# Patient Record
Sex: Male | Born: 1965 | Race: Black or African American | Hispanic: No | Marital: Married | Smoking: Never smoker
Health system: Southern US, Community
[De-identification: ages and names within clinical notes are randomized; demographics above are authoritative.]

## PROBLEM LIST (undated history)

## (undated) DIAGNOSIS — I10 Essential (primary) hypertension: Secondary | ICD-10-CM

## (undated) DIAGNOSIS — I161 Hypertensive emergency: Secondary | ICD-10-CM

## (undated) DIAGNOSIS — J45909 Unspecified asthma, uncomplicated: Secondary | ICD-10-CM

## (undated) DIAGNOSIS — J869 Pyothorax without fistula: Secondary | ICD-10-CM

## (undated) DIAGNOSIS — N179 Acute kidney failure, unspecified: Secondary | ICD-10-CM

## (undated) DIAGNOSIS — F4323 Adjustment disorder with mixed anxiety and depressed mood: Secondary | ICD-10-CM

## (undated) DIAGNOSIS — Z992 Dependence on renal dialysis: Secondary | ICD-10-CM

## (undated) DIAGNOSIS — N186 End stage renal disease: Secondary | ICD-10-CM

## (undated) DIAGNOSIS — N289 Disorder of kidney and ureter, unspecified: Secondary | ICD-10-CM

## (undated) DIAGNOSIS — E1129 Type 2 diabetes mellitus with other diabetic kidney complication: Secondary | ICD-10-CM

## (undated) DIAGNOSIS — J90 Pleural effusion, not elsewhere classified: Secondary | ICD-10-CM

## (undated) DIAGNOSIS — E119 Type 2 diabetes mellitus without complications: Secondary | ICD-10-CM

## (undated) DIAGNOSIS — K219 Gastro-esophageal reflux disease without esophagitis: Secondary | ICD-10-CM

## (undated) DIAGNOSIS — I509 Heart failure, unspecified: Secondary | ICD-10-CM

## (undated) DIAGNOSIS — F410 Panic disorder [episodic paroxysmal anxiety] without agoraphobia: Secondary | ICD-10-CM

## (undated) DIAGNOSIS — I674 Hypertensive encephalopathy: Secondary | ICD-10-CM

## (undated) DIAGNOSIS — J189 Pneumonia, unspecified organism: Secondary | ICD-10-CM

## (undated) DIAGNOSIS — Z9989 Dependence on other enabling machines and devices: Secondary | ICD-10-CM

## (undated) DIAGNOSIS — G4730 Sleep apnea, unspecified: Secondary | ICD-10-CM

## (undated) DIAGNOSIS — E1149 Type 2 diabetes mellitus with other diabetic neurological complication: Secondary | ICD-10-CM

## (undated) HISTORY — DX: Type 2 diabetes mellitus without complications: E11.9

## (undated) HISTORY — DX: Unspecified asthma, uncomplicated: J45.909

## (undated) HISTORY — PX: NO PAST SURGERIES: SHX2092

---

## 2010-09-03 ENCOUNTER — Emergency Department (HOSPITAL_COMMUNITY)
Admission: EM | Admit: 2010-09-03 | Discharge: 2010-09-03 | Disposition: A | Discharge status: Home / Self Care | Payer: BC Managed Care – PPO | Attending: Emergency Medicine | Admitting: Emergency Medicine

## 2010-09-03 ENCOUNTER — Emergency Department (HOSPITAL_COMMUNITY): Payer: BC Managed Care – PPO

## 2010-09-03 DIAGNOSIS — N183 Chronic kidney disease, stage 3 unspecified: Secondary | ICD-10-CM | POA: Insufficient documentation

## 2010-09-03 DIAGNOSIS — I509 Heart failure, unspecified: Secondary | ICD-10-CM | POA: Insufficient documentation

## 2010-09-03 DIAGNOSIS — R42 Dizziness and giddiness: Secondary | ICD-10-CM | POA: Insufficient documentation

## 2010-09-03 DIAGNOSIS — R51 Headache: Secondary | ICD-10-CM | POA: Insufficient documentation

## 2010-09-03 DIAGNOSIS — R0602 Shortness of breath: Secondary | ICD-10-CM | POA: Insufficient documentation

## 2010-09-03 DIAGNOSIS — I129 Hypertensive chronic kidney disease with stage 1 through stage 4 chronic kidney disease, or unspecified chronic kidney disease: Secondary | ICD-10-CM | POA: Insufficient documentation

## 2010-09-03 DIAGNOSIS — R110 Nausea: Secondary | ICD-10-CM | POA: Insufficient documentation

## 2010-09-03 DIAGNOSIS — R55 Syncope and collapse: Secondary | ICD-10-CM | POA: Insufficient documentation

## 2010-09-03 LAB — CK TOTAL AND CKMB (NOT AT ARMC): Total CK: 296 U/L — ABNORMAL HIGH (ref 7–232)

## 2010-09-03 LAB — CBC
MCH: 29.9 pg (ref 26.0–34.0)
MCHC: 34.5 g/dL (ref 30.0–36.0)
Platelets: 279 10*3/uL (ref 150–400)
RBC: 4.45 MIL/uL (ref 4.22–5.81)
RDW: 13.4 % (ref 11.5–15.5)

## 2010-09-03 LAB — BASIC METABOLIC PANEL
BUN: 20 mg/dL (ref 6–23)
CO2: 28 mEq/L (ref 19–32)
Chloride: 103 mEq/L (ref 96–112)
Creatinine, Ser: 1.81 mg/dL — ABNORMAL HIGH (ref 0.4–1.5)
Glucose, Bld: 98 mg/dL (ref 70–99)
Potassium: 2.9 mEq/L — ABNORMAL LOW (ref 3.5–5.1)

## 2010-09-03 LAB — DIFFERENTIAL
Basophils Relative: 0 % (ref 0–1)
Eosinophils Absolute: 0.2 10*3/uL (ref 0.0–0.7)
Eosinophils Relative: 2 % (ref 0–5)
Monocytes Relative: 5 % (ref 3–12)
Neutrophils Relative %: 67 % (ref 43–77)

## 2010-09-03 LAB — TROPONIN I: Troponin I: <0.3 ng/mL (ref <0.30)

## 2011-01-08 ENCOUNTER — Emergency Department (HOSPITAL_COMMUNITY): Payer: BC Managed Care – PPO

## 2011-01-08 ENCOUNTER — Inpatient Hospital Stay (HOSPITAL_COMMUNITY)
Admission: EM | Admit: 2011-01-08 | Discharge: 2011-01-10 | DRG: 331 | Disposition: A | Discharge status: Home / Self Care | Payer: BC Managed Care – PPO | Attending: Internal Medicine | Admitting: Internal Medicine

## 2011-01-08 DIAGNOSIS — Z23 Encounter for immunization: Secondary | ICD-10-CM

## 2011-01-08 DIAGNOSIS — Z79899 Other long term (current) drug therapy: Secondary | ICD-10-CM

## 2011-01-08 DIAGNOSIS — N183 Chronic kidney disease, stage 3 unspecified: Secondary | ICD-10-CM | POA: Diagnosis present

## 2011-01-08 DIAGNOSIS — I509 Heart failure, unspecified: Secondary | ICD-10-CM | POA: Diagnosis present

## 2011-01-08 DIAGNOSIS — E876 Hypokalemia: Secondary | ICD-10-CM | POA: Diagnosis present

## 2011-01-08 DIAGNOSIS — E119 Type 2 diabetes mellitus without complications: Secondary | ICD-10-CM | POA: Diagnosis present

## 2011-01-08 DIAGNOSIS — I129 Hypertensive chronic kidney disease with stage 1 through stage 4 chronic kidney disease, or unspecified chronic kidney disease: Principal | ICD-10-CM | POA: Diagnosis present

## 2011-01-08 LAB — POCT I-STAT TROPONIN I: Troponin i, poc: 0.02 ng/mL (ref 0.00–0.08)

## 2011-01-08 LAB — BASIC METABOLIC PANEL
BUN: 19 mg/dL (ref 6–23)
CO2: 28 mEq/L (ref 19–32)
Calcium: 9.6 mg/dL (ref 8.4–10.5)
GFR calc non Af Amer: 38 mL/min — ABNORMAL LOW (ref >90)
Glucose, Bld: 150 mg/dL — ABNORMAL HIGH (ref 70–99)
Potassium: 2.8 mEq/L — ABNORMAL LOW (ref 3.5–5.1)
Sodium: 136 mEq/L (ref 135–145)

## 2011-01-08 LAB — DIFFERENTIAL
Lymphocytes Relative: 30 % (ref 12–46)
Lymphs Abs: 2.4 10*3/uL (ref 0.7–4.0)
Monocytes Absolute: 0.5 10*3/uL (ref 0.1–1.0)
Monocytes Relative: 6 % (ref 3–12)
Neutro Abs: 5.2 10*3/uL (ref 1.7–7.7)
Neutrophils Relative %: 63 % (ref 43–77)

## 2011-01-08 LAB — CBC
HCT: 37.8 % — ABNORMAL LOW (ref 39.0–52.0)
Hemoglobin: 13.2 g/dL (ref 13.0–17.0)
MCH: 30.3 pg (ref 26.0–34.0)
MCHC: 34.9 g/dL (ref 30.0–36.0)
RBC: 4.36 MIL/uL (ref 4.22–5.81)

## 2011-01-09 LAB — DIFFERENTIAL
Eosinophils Absolute: 0.2 10*3/uL (ref 0.0–0.7)
Lymphocytes Relative: 30 % (ref 12–46)
Lymphs Abs: 2.2 10*3/uL (ref 0.7–4.0)
Monocytes Relative: 5 % (ref 3–12)
Neutrophils Relative %: 62 % (ref 43–77)

## 2011-01-09 LAB — URINALYSIS, ROUTINE W REFLEX MICROSCOPIC
Bilirubin Urine: NEGATIVE
Glucose, UA: NEGATIVE mg/dL
Hgb urine dipstick: NEGATIVE
Ketones, ur: NEGATIVE mg/dL
Leukocytes, UA: NEGATIVE
Protein, ur: NEGATIVE mg/dL
pH: 6.5 (ref 5.0–8.0)

## 2011-01-09 LAB — BASIC METABOLIC PANEL
BUN: 18 mg/dL (ref 6–23)
CO2: 28 mEq/L (ref 19–32)
Calcium: 9.3 mg/dL (ref 8.4–10.5)
Chloride: 103 mEq/L (ref 96–112)
Creatinine, Ser: 1.91 mg/dL — ABNORMAL HIGH (ref 0.50–1.35)
Glucose, Bld: 124 mg/dL — ABNORMAL HIGH (ref 70–99)

## 2011-01-09 LAB — CBC
HCT: 38.7 % — ABNORMAL LOW (ref 39.0–52.0)
MCH: 29.7 pg (ref 26.0–34.0)
MCV: 87 fL (ref 78.0–100.0)
RBC: 4.45 MIL/uL (ref 4.22–5.81)
WBC: 7.3 10*3/uL (ref 4.0–10.5)

## 2011-01-09 LAB — UREA NITROGEN, URINE: Urea Nitrogen, Ur: 525 mg/dL

## 2011-01-09 LAB — MRSA PCR SCREENING: MRSA by PCR: POSITIVE — AB

## 2011-01-09 LAB — OSMOLALITY, URINE: Osmolality, Ur: 440 mOsm/kg (ref 390–1090)

## 2011-01-09 LAB — NA AND K (SODIUM & POTASSIUM), RAND UR: Potassium Urine: 25 mEq/L

## 2011-01-10 LAB — BASIC METABOLIC PANEL
BUN: 17 mg/dL (ref 6–23)
Chloride: 103 mEq/L (ref 96–112)
GFR calc Af Amer: 49 mL/min — ABNORMAL LOW (ref >90)
Glucose, Bld: 117 mg/dL — ABNORMAL HIGH (ref 70–99)
Potassium: 3.3 mEq/L — ABNORMAL LOW (ref 3.5–5.1)

## 2011-01-10 LAB — CBC
Hemoglobin: 13.4 g/dL (ref 13.0–17.0)
MCHC: 34.2 g/dL (ref 30.0–36.0)
RDW: 13.7 % (ref 11.5–15.5)
WBC: 6.8 10*3/uL (ref 4.0–10.5)

## 2011-01-10 LAB — DIFFERENTIAL
Basophils Absolute: 0 10*3/uL (ref 0.0–0.1)
Basophils Relative: 0 % (ref 0–1)
Monocytes Absolute: 0.4 10*3/uL (ref 0.1–1.0)
Neutro Abs: 4.3 10*3/uL (ref 1.7–7.7)
Neutrophils Relative %: 63 % (ref 43–77)

## 2011-01-10 NOTE — H&P (Signed)
NAME:  [name], [name] NO.:  [id number]  MEDICAL RECORD NO.:  [id number]  LOCATION:  2920                         FACILITY:  [facility]  PHYSICIAN:  Ria Bush, MD         DATE OF BIRTH:  February 05, 1966  DATE OF ADMISSION:  01/08/2011 DATE OF DISCHARGE:                             HISTORY & PHYSICAL   PRIMARY CARE PHYSICIAN:  Dr. Daneen Schick in cardiology, also Dr. Reymundo Poll.  The patient recently moved from [place] where a Dr. Sharol Given in Nephrology was following him and also Dr. Norberta Keens.  CHIEF COMPLAINT:  Sharp chest pains and elevated blood pressure.  HISTORY OF PRESENT ILLNESS:  This is a 45 year old male with a history of severe hypertension of unclear etiology, chronic kidney disease who recently moved about 2 months ago from [place], where he was receiving most of his care.  He presents with severely elevated blood pressures and sharp chest pains.  Starting around 8:00 p.m., he began developing sharp chest pain located substernally on his right associated with dyspnea and a bit of dizziness but no loss of consciousness.  The pain was worse with breathing.  He also felt an increase in his heart rate and states that this constellation of symptoms is not atypical for him when his blood pressure is severely elevated.  It appears that the patient was admitted in May 2012, with the constellation of the same symptoms as well also thought to be due to elevated blood pressure.  He checked his blood pressure home and it was 220/120, so he came in to the ED for further evaluation.  His chest pains chest pain resolved by the time he arrived here but he was still a bit dyspneic.  On arrival to the emergency room, his blood pressure was 216/132 with a pulse of 79.  He was found to be hypokalemic 2.8 with a negative troponin and his creatinine elevated to 2.0.  Chest x-ray was not congested.  He was started on labetalol drip and his blood pressure was 160/100, he was given  an aspirin 325 and 40 mEq of oral potassium.  His EKG showed no acute ST-T segment changes.  REVIEW OF SYSTEMS:  He relates the above complaints of the sharp chest pains that are now resolved and dyspnea is better as well, but he is having a headache, which started before he arrived to the hospital.  He denies any nausea, vomiting, abdominal pain or diarrhea.  He has some tightness in his hands but that has happened to him before occasionally.  REVIEW OF SYSTEMS:  Otherwise unremarkable.  PAST MEDICAL HISTORY:  Severe hypertension.  It is unclear the etiology of this and what the former workup is.  Specifically, he is not sure of his adrenals or his kidneys have been imaged or had specific workup for secondary hypertension.  He is previously followed by Dr. Sharol Given who was a nephrologist in [place] and the Dr. Norberta Keens.  However, he just moved here from [place] and is established with Dr. Daneen Schick.  Of note, the patient has an extremely large binder full of medical records.  MEDICATION LIST:  Reconciled by the pharmacist include potassium chloride  20 mEq 2 tablets daily, Lasix 40 mg daily, hydralazine 50 mg 1 tablet q.8 h., labetalol 200 mg 2 tablets twice daily, clonidine 0.2 mg 2 tablets q.8 h., amlodipine 5 mg b.i.d.  There are no known drug allergies noted.  SOCIAL HISTORY:  He lives at home with his mother and moved to here 2 months ago from [place], where he previously was receiving care.  He is a never smoker and does not drink or do any alcohol.  FAMILY HISTORY:  Mother has hypertension and diabetes.  His father does not have any major medical problems.  PHYSICAL EXAMINATION:  VITAL SIGNS:  Blood pressure 171/105 ranging 162- 178 over 105-107 through his admission, pulse 71, 97% on room air, 18. GENERAL:  He is a large but not obese male lying in the step-down bed. He is able to relate a good history.  He does not appear to be in any cardiopulmonary distress but  slightly uncomfortable due to the headache. HEENT:  His pupils are round and reactive to light.  His extraocular muscles intact.  His sclerae are quite injected diffusely but non icteric.  His extra extraocular muscles intact.  His mouth is moist and normal-appearing with no gross lesions. LUNGS:  Clear to auscultation bilaterally with no wheezes, crackles, rales or rhonchi.  Air movement is fair. HEART:  Regular rate and rhythm with no apparent murmurs or gallops. ABDOMEN:  Bit protuberant but is soft, nontender, nondistended and benign. EXTREMITIES:  Warm, well-perfused with no cyanosis or clubbing.  There is no bilateral lower extremity edema. NEUROLOGIC:  Grossly nonfocal.  LABORATORY WORK:  White blood cell count is 8.2, hematocrit 37.8 within his baseline, platelets 268.  Chemistry panel shows sodium 136, K is 2.8, chloride 98, bicarb 28, BUN and creatinine are 19 and 2.02, it was previously 1.81 in May 2012, glucose is 150. CKs 296, MB is 2.5, relative index is negative.  Troponin is less than 0.3 and troponin point of care is 0.02.  BNP is 195.  Chest x-ray shows no evidence of active cardiopulmonary disease but a prior chest x-ray did show evidence of cardiomegaly.  EKG is normal sinus rhythm at 70 beats per minute.  It has a normal axis P-waves show potential LAE in V1.  R-wave progression is decent.  There is some minimal J-point elevation in V2-V3, but it appears quite benign and some nonspecific T-wave flattening in III and aVL.  Overall, this is a fairly unimpressive EKG.  There is no prior for comparison.  This is a 45 year old male with a history of hypertension of unclear etiology and chronic kidney disease, hypokalemia who presents with chest pressure and dyspnea and headache and found to be severely hypertensive with hypokalemia. 1. Hypertensive urgency.  I suspect his constellation of symptoms is     mostly due to hypertensive urgency and not due to acute  coronary.     His symptoms are also consistent with prior episodes of     hypertension per the patient.  He is currently on a labetalol drip     and we will initiate his oral regimen at an increased dose of oral     labetalol and continue the rest of his home meds as well.  The goal     be to lower it by 25% within the first 24 hours, i.e. around [id number]     [id number] systolics so as to not drop him too fast. The more important question in the big picture would be  why he is having such severe hypertension at only 45 years old, certainly chronic kidney disease and end-stage renal function could be causing this.  However, the end-stage renal disease could also be due to the hypertension, I am unclear if his kidneys have been imaged to rule out a renal artery stenosis or if his adrenals have been tested.  Therefore in the morning, I would touch base with his doctor Daneen Schick in Cardiology who family states has looked through his tremendous amount of medical records and see what workup has been done.  Alternatively, this large tomb of medical records is in his book but it would take quite a long time and is not as urgent overnight.  Therefore, we will treat the hypertension and defer an extensive workup at this point until daytime.  We will hold his Lasix given his renal function. 1. Hypokalemia.  In combination with severe hypertension.  This is     suspicious for an adrenal process, i.e. Conn hypoaldosteronism. As above, we will defer an extensive workup at this point in favor of getting in touch with PCP and looking at old records, however, the think an aldosterone and renin level would be relatively easy test to do given the hypokalemia.  I would also consult renal in the morning. 1. Chronic kidney disease.  Again, unclear if this is due to or a     cause of this severe hypertension.  He was being previously     followed by nephrologist.  His creatinine appears stable over the     last several  months at 2.0, up from 1.8.  I am hesitant to give him     any IV fluids to see if this is a prerenal given his severe     hypertension and frankly I think this is much less likely any ways.     Therefore, at this point would just trend his BMET. 2. Fluids, electrolytes, and nutrition.  He can get a regular heart     healthy diet.  We will not give him any IV fluids at present. 3. Prophylaxis.  Subcutaneous heparin unless he is ambulatory, Zofran,     Colace and senna, Tylenol. 4. IV access.  He has one peripheral IV. 5. Code status.  He is full code.  I discussed this with him.  The patient is admitted to the step-down unit, [facility].          ______________________________ Ria Bush, MD     EB/MEDQ  D:  01/09/2011  T:  01/09/2011  Job:  [id number]  Electronically Signed by Ria Bush MD on 01/10/2011 12:36:11 PM

## 2011-01-22 LAB — ALDOSTERONE + RENIN ACTIVITY W/ RATIO
ALDO / PRA Ratio: 144.4 Ratio — ABNORMAL HIGH (ref 0.9–28.9)
Aldosterone: 13 ng/dL

## 2011-01-22 NOTE — Discharge Summary (Signed)
NAME:  CORDUzay, [name] NO.:  [id number]  MEDICAL RECORD NO.:  [id number]  LOCATION:  3736                         FACILITY:  [facility]  PHYSICIAN:  Verlee Monte, MD       DATE OF BIRTH:  1965-05-06  DATE OF ADMISSION:  01/08/2011 DATE OF DISCHARGE:                              DISCHARGE SUMMARY   PRIMARY CARE PHYSICIAN:  Reymundo Poll, MD  PRIMARY CARDIOLOGIST:  Belva Crome, MD  REASON FOR ADMISSION:  Elevated blood pressure.  DISCHARGE DIAGNOSES: 1. Hypertensive urgency. 2. Hypertension, uncontrolled. 3. Diabetes. 4. Chronic kidney disease, stage III. 5. Reported history of congestive heart failure. 6. Hypokalemia.  DISCHARGE MEDICATIONS: 1. Amlodipine 5 mg p.o. b.i.d. 2. Clonidine 0.2 mg 2 tablets every 8 hours. 3. Furosemide 40 mg p.o. daily. 4. Hydralazine 50 mg every 8 hours. 5. Labetalol 200 mg 2 tablets b.i.d. 6. Potassium chloride 20 mEq 2 tablets p.o. daily.  RADIOLOGY:  Chest x-ray showed no evidence of active cardiopulmonary disease.  BRIEF HISTORY AND EXAMINATION:  [name] is a 45 year old African American male with past medical history of hypertension, chronic kidney disease stage II.  The patient came into the hospital with elevated blood pressure and sharp chest pain.  The patient said about 8 p.m. on the night before admission he began developing sharp chest pain located substernally in his right side of the chest associated with dyspnea, a bit of dizziness with no loss of consciousness.  Pain was worse with breathing.  He also felt he has increase in his heart rate, stated that this constellation of symptoms happened before, it is atypical when he had blood pressure severely elevated.  The patient evaluated in the emergency department and found to have blood pressure of 216/132.  The patient was ankle found to have hypokalemia of 2.8, negative troponin, his creatinine is elevated to 2.0.  Chest x-ray was clear.  The  patient admitted to the hospital for further evaluation. 1. Hypertensive urgency.  The patient admitted to the hospital step-     down unit, started on labetalol drip.  His blood pressure titrated     for systolic blood pressure around 150.  His oral medication     restarted gradually.  On the day of discharge, he was taking his     blood pressure medications and systolic blood pressure is 142.  His     blood pressure is being controlled now.  The patient because of the     hypokalemia associated with it raised a question about     hyperaldosteronism.  The patient to follow up with Dr. Cori Razor on     January 17, 2011.  The patient has to be continued on his     medication until he sees Dr. Tamala Julian.  Of note, there is one set of     negative cardiac enzymes.  His EKG was negative for changes. 2. Chronic kidney disease.  The patient came in with creatinine of 2.     His baseline creatinine is 1.8 from May.  On day of discharge it is     1.8.  So the patient was put back on his Lasix  so it is probably     going to help with his blood pressure control. 3. Hypokalemia.  The patient was already taking potassium     supplementation, the supplementation was increased during the     hospital stay and asked to take his potassium supplements at     discharge.  DISCHARGE VITAL SIGNS:  Temperature is 98.5, pulse 75, respirations 16, blood pressure 142/91, O2 sats 97%.  Of note, there is no echocardiogram in the hospital records here.  The patient reported that he does have congestive heart failure.  This appears to be compensated now.  The patient does not have any lower extremity edema nor chest x-ray findings.  The patient also does not have any symptoms or signs of congestive heart failure.  DISCHARGE INSTRUCTIONS: 1. Activity as tolerated. 2. Disposition home. 3. Diet heart-healthy diet.     Verlee Monte, MD     ME/MEDQ  D:  01/10/2011  T:  01/10/2011  Job:  [id number]  cc:   Reymundo Poll, MD Belva Crome, M.D.  Electronically Signed by Verlee Monte  on 01/22/2011 01:50:12 PM

## 2011-03-28 ENCOUNTER — Observation Stay (HOSPITAL_COMMUNITY)
Admission: EM | Admit: 2011-03-28 | Discharge: 2011-03-31 | Disposition: A | Discharge status: Home / Self Care | Payer: Medicaid Other | Attending: Internal Medicine | Admitting: Internal Medicine

## 2011-03-28 ENCOUNTER — Other Ambulatory Visit: Payer: Self-pay

## 2011-03-28 ENCOUNTER — Emergency Department (HOSPITAL_COMMUNITY): Payer: Medicaid Other

## 2011-03-28 ENCOUNTER — Encounter: Payer: Self-pay | Admitting: *Deleted

## 2011-03-28 ENCOUNTER — Observation Stay (HOSPITAL_COMMUNITY): Payer: Medicaid Other

## 2011-03-28 DIAGNOSIS — E876 Hypokalemia: Principal | ICD-10-CM | POA: Diagnosis present

## 2011-03-28 DIAGNOSIS — I10 Essential (primary) hypertension: Secondary | ICD-10-CM

## 2011-03-28 DIAGNOSIS — I1A0 Resistant hypertension: Secondary | ICD-10-CM

## 2011-03-28 DIAGNOSIS — M25519 Pain in unspecified shoulder: Secondary | ICD-10-CM | POA: Insufficient documentation

## 2011-03-28 DIAGNOSIS — I129 Hypertensive chronic kidney disease with stage 1 through stage 4 chronic kidney disease, or unspecified chronic kidney disease: Secondary | ICD-10-CM | POA: Insufficient documentation

## 2011-03-28 DIAGNOSIS — N289 Disorder of kidney and ureter, unspecified: Secondary | ICD-10-CM

## 2011-03-28 DIAGNOSIS — N183 Chronic kidney disease, stage 3 unspecified: Secondary | ICD-10-CM | POA: Insufficient documentation

## 2011-03-28 DIAGNOSIS — N185 Chronic kidney disease, stage 5: Secondary | ICD-10-CM | POA: Diagnosis present

## 2011-03-28 DIAGNOSIS — R079 Chest pain, unspecified: Secondary | ICD-10-CM | POA: Insufficient documentation

## 2011-03-28 HISTORY — DX: Essential (primary) hypertension: I10

## 2011-03-28 HISTORY — DX: Resistant hypertension: I1A.0

## 2011-03-28 HISTORY — DX: Disorder of kidney and ureter, unspecified: N28.9

## 2011-03-28 LAB — CARDIAC PANEL(CRET KIN+CKTOT+MB+TROPI)
CK, MB: 2.1 ng/mL (ref 0.3–4.0)
Relative Index: 1 (ref 0.0–2.5)
Total CK: 215 U/L (ref 7–232)
Troponin I: <0.3 ng/mL (ref <0.30)

## 2011-03-28 LAB — DIFFERENTIAL
Basophils Absolute: 0 10*3/uL (ref 0.0–0.1)
Lymphocytes Relative: 31 % (ref 12–46)
Monocytes Absolute: 0.3 10*3/uL (ref 0.1–1.0)
Neutro Abs: 4.2 10*3/uL (ref 1.7–7.7)
Neutrophils Relative %: 61 % (ref 43–77)

## 2011-03-28 LAB — BASIC METABOLIC PANEL
CO2: 28 mEq/L (ref 19–32)
Chloride: 99 mEq/L (ref 96–112)
Creatinine, Ser: 1.95 mg/dL — ABNORMAL HIGH (ref 0.50–1.35)
GFR calc Af Amer: 46 mL/min — ABNORMAL LOW (ref >90)
Potassium: 2.7 mEq/L — CL (ref 3.5–5.1)
Sodium: 138 mEq/L (ref 135–145)

## 2011-03-28 LAB — CBC
HCT: 38.8 % — ABNORMAL LOW (ref 39.0–52.0)
RDW: 13.4 % (ref 11.5–15.5)
WBC: 6.9 10*3/uL (ref 4.0–10.5)

## 2011-03-28 LAB — MAGNESIUM: Magnesium: 2 mg/dL (ref 1.5–2.5)

## 2011-03-28 MED ORDER — FUROSEMIDE 80 MG PO TABS
80.0000 mg | ORAL_TABLET | Freq: Two times a day (BID) | ORAL | Status: DC
  Administered 2011-03-29 – 2011-03-31 (×4): 80 mg via ORAL
  Filled 2011-03-28 (×7): qty 1

## 2011-03-28 MED ORDER — AMLODIPINE BESYLATE 10 MG PO TABS
10.0000 mg | ORAL_TABLET | Freq: Two times a day (BID) | ORAL | Status: DC
  Administered 2011-03-28 – 2011-03-31 (×6): 10 mg via ORAL
  Filled 2011-03-28 (×7): qty 1

## 2011-03-28 MED ORDER — ACETAMINOPHEN 650 MG RE SUPP: 650.0000 mg | Freq: Four times a day (QID) | RECTAL | Status: DC | PRN

## 2011-03-28 MED ORDER — POTASSIUM CHLORIDE CRYS ER 20 MEQ PO TBCR
40.0000 meq | EXTENDED_RELEASE_TABLET | Freq: Once | ORAL | Status: AC
  Administered 2011-03-28: 40 meq via ORAL
  Filled 2011-03-28: qty 2

## 2011-03-28 MED ORDER — ONDANSETRON HCL 4 MG PO TABS: 4.0000 mg | ORAL_TABLET | Freq: Four times a day (QID) | ORAL | Status: DC | PRN

## 2011-03-28 MED ORDER — ONDANSETRON HCL 4 MG/2ML IJ SOLN: 4.0000 mg | Freq: Four times a day (QID) | INTRAMUSCULAR | Status: DC | PRN

## 2011-03-28 MED ORDER — SODIUM CHLORIDE 0.9 % IJ SOLN
3.0000 mL | Freq: Two times a day (BID) | INTRAMUSCULAR | Status: DC
  Administered 2011-03-28 – 2011-03-30 (×5): 3 mL via INTRAVENOUS

## 2011-03-28 MED ORDER — POTASSIUM CHLORIDE CRYS ER 20 MEQ PO TBCR
40.0000 meq | EXTENDED_RELEASE_TABLET | Freq: Two times a day (BID) | ORAL | Status: DC
  Administered 2011-03-28 – 2011-03-30 (×5): 40 meq via ORAL
  Filled 2011-03-28 (×7): qty 2

## 2011-03-28 MED ORDER — CLONIDINE HCL 0.2 MG PO TABS
0.2000 mg | ORAL_TABLET | Freq: Four times a day (QID) | ORAL | Status: DC
  Administered 2011-03-28 – 2011-03-30 (×6): 0.2 mg via ORAL
  Filled 2011-03-28 (×13): qty 1

## 2011-03-28 MED ORDER — SODIUM CHLORIDE 0.9 % IV BOLUS (SEPSIS)
500.0000 mL | Freq: Once | INTRAVENOUS | Status: AC
  Administered 2011-03-28: 500 mL via INTRAVENOUS

## 2011-03-28 MED ORDER — LABETALOL HCL 200 MG PO TABS
400.0000 mg | ORAL_TABLET | Freq: Two times a day (BID) | ORAL | Status: DC
  Administered 2011-03-28 – 2011-03-31 (×6): 400 mg via ORAL
  Filled 2011-03-28 (×7): qty 2

## 2011-03-28 MED ORDER — ACETAMINOPHEN 325 MG PO TABS: 650.0000 mg | ORAL_TABLET | Freq: Four times a day (QID) | ORAL | Status: DC | PRN

## 2011-03-28 MED ORDER — POTASSIUM CHLORIDE 10 MEQ/100ML IV SOLN
10.0000 meq | Freq: Once | INTRAVENOUS | Status: AC
  Administered 2011-03-28: 10 meq via INTRAVENOUS
  Filled 2011-03-28: qty 100

## 2011-03-28 NOTE — ED Notes (Signed)
Pt states he has been having bad cramps for last few weeks.  States he has been having episodes of extreme weakness. Denies chest pain, denies N/V but has had slight diarrhea.

## 2011-03-28 NOTE — H&P (Signed)
[name] is an 45 y.o. male.   PCP - Usually follows cardiologist Dr. Daneen Schick. Chief Complaint: Right shoulder pain and leg cramps. HPI: 45 year-old male with chronic kidney disease stage III, hypertension present in the ER with increasing pain in the right shoulder which has worsened over the last one week. In addition patient also noticed cramps in his lower extremities bilaterally. Patient states that his right shoulder pain has been there for last 2 months but worsened over the last one week when he has pain on trying to lift anything. Denies any trauma or fall. In addition patient has noticed cramps in both calves. Denies any chest pain shortness of breath cough or phlegm fever chills nausea vomiting or mouth pain or diarrhea. In the ER patient was found to have a potassium of 2.7 patient and admit for further observation.    Past Medical History  Diagnosis Date  . Hypertension   . Renal disorder     History reviewed. No pertinent past surgical history.  Family History  Problem Relation Age of Onset  . Hypertension Mother    Social History:  reports that he has never smoked. He does not have any smokeless tobacco history on file. He reports that he does not drink alcohol. His drug history not on file.  Allergies: No Known Allergies  Medications Prior to Admission  Medication Dose Route Frequency Provider Last Rate Last Dose  . potassium chloride 10 mEq in 100 mL IVPB  10 mEq Intravenous Once [employer]. Pickering, MD   10 mEq at 03/28/11 1816  . sodium chloride 0.9 % bolus 500 mL  500 mL Intravenous Once Ovid Curd R. Pickering, MD   500 mL at 03/28/11 1701   No current outpatient prescriptions on file as of 03/28/2011.    Results for orders placed during the hospital encounter of 03/28/11 (from the past 48 hour(s))  CBC     Status: Abnormal   Collection Time   03/28/11  4:54 PM      Component Value Range Comment   WBC 6.9  4.0 - 10.5 (K/uL)    RBC 4.48  4.22 - 5.81  (MIL/uL)    Hemoglobin 13.5  13.0 - 17.0 (g/dL)    HCT 38.8 (*) 39.0 - 52.0 (%)    MCV 86.6  78.0 - 100.0 (fL)    MCH 30.1  26.0 - 34.0 (pg)    MCHC 34.8  30.0 - 36.0 (g/dL)    RDW 13.4  11.5 - 15.5 (%)    Platelets 272  150 - 400 (K/uL)   DIFFERENTIAL     Status: Normal   Collection Time   03/28/11  4:54 PM      Component Value Range Comment   Neutrophils Relative 61  43 - 77 (%)    Neutro Abs 4.2  1.7 - 7.7 (K/uL)    Lymphocytes Relative 31  12 - 46 (%)    Lymphs Abs 2.2  0.7 - 4.0 (K/uL)    Monocytes Relative 5  3 - 12 (%)    Monocytes Absolute 0.3  0.1 - 1.0 (K/uL)    Eosinophils Relative 3  0 - 5 (%)    Eosinophils Absolute 0.2  0.0 - 0.7 (K/uL)    Basophils Relative 0  0 - 1 (%)    Basophils Absolute 0.0  0.0 - 0.1 (K/uL)   BASIC METABOLIC PANEL     Status: Abnormal   Collection Time   03/28/11  4:54 PM  Component Value Range Comment   Sodium 138  135 - 145 (mEq/L)    Potassium 2.7 (*) 3.5 - 5.1 (mEq/L)    Chloride 99  96 - 112 (mEq/L)    CO2 28  19 - 32 (mEq/L)    Glucose, Bld 114 (*) 70 - 99 (mg/dL)    BUN 20  6 - 23 (mg/dL)    Creatinine, Ser 1.95 (*) 0.50 - 1.35 (mg/dL)    Calcium 9.5  8.4 - 10.5 (mg/dL)    GFR calc non Af Amer 40 (*) >90 (mL/min)    GFR calc Af Amer 46 (*) >90 (mL/min)   MAGNESIUM     Status: Normal   Collection Time   03/28/11  4:54 PM      Component Value Range Comment   Magnesium 2.0  1.5 - 2.5 (mg/dL)   CARDIAC PANEL(CRET KIN+CKTOT+MB+TROPI)     Status: Normal   Collection Time   03/28/11  4:54 PM      Component Value Range Comment   Total CK 215  7 - 232 (U/L)    CK, MB 2.1  0.3 - 4.0 (ng/mL)    Troponin I <0.30  <0.30 (ng/mL)    Relative Index 1.0  0.0 - 2.5     Dg Chest 2 View  03/28/2011  *RADIOLOGY REPORT*  Clinical Data: Chest pain and weakness.  CHEST - 2 VIEW  Comparison: 01/08/2011  Findings: Cardiac enlargement.  Negative for heart failure. Vascularity is normal.  Lungs are clear without infiltrate or effusion.   IMPRESSION: Cardiac enlargement.  No acute abnormality.  Original Report Authenticated By: Truett Perna, M.D.    Review of Systems  Constitutional: Negative.   HENT: Negative.   Eyes: Negative.   Respiratory: Negative.   Cardiovascular: Negative.   Gastrointestinal: Negative.   Genitourinary: Negative.   Musculoskeletal: Positive for joint pain.       Right shoulder pain. Lower extremity cramps.  Skin: Negative.   Neurological: Negative.   Endo/Heme/Allergies: Negative.   Psychiatric/Behavioral: Negative.     Blood pressure 151/97, pulse 65, temperature 97.2 F (36.2 C), temperature source Oral, resp. rate 22, SpO2 99.00%. Physical Exam  Constitutional: He is oriented to person, place, and time. He appears well-developed and well-nourished. No distress.  HENT:  Head: Normocephalic and atraumatic.  Right Ear: External ear normal.  Left Ear: External ear normal.  Nose: Nose normal.  Mouth/Throat: Oropharynx is clear and moist. No oropharyngeal exudate.  Eyes: Conjunctivae and EOM are normal. Pupils are equal, round, and reactive to light. Right eye exhibits no discharge. Left eye exhibits no discharge. No scleral icterus.  Neck: Normal range of motion. Neck supple. No tracheal deviation present. No thyromegaly present.  Cardiovascular: Normal rate, regular rhythm and normal heart sounds.   Respiratory: Effort normal and breath sounds normal. No respiratory distress. He has no wheezes. He has no rales.  GI: Soft. Bowel sounds are normal. He exhibits no distension. There is no tenderness. There is no rebound.  Musculoskeletal: Normal range of motion. He exhibits no edema and no tenderness.       Pain on abducting the right shoulder.  Neurological: He is alert and oriented to person, place, and time. He has normal reflexes. No cranial nerve deficit. Coordination normal.  Skin: Skin is warm and dry. He is not diaphoretic.  Psychiatric: His behavior is normal.      Assessment/Plan #1. Hypokalemia in the setting of hypertension - patient is already seen one dose of potassium  chloride 10 mEq IV and I will be ordering 40 mg by mouth one dose and repeat metabolic panel in a.m. Patient does have chronic kidney disease so we'll be closely following metabolic panel and not be too aggressive in replacing the potassium. We'll check a magnesium level. As patient has been having hypokalemia and hypertension will check for primary hyperaldosteronism with renin aldosterone ratio.His leg cramps could be due to hypokalemia but will check Doppler to rule out DVT.  #2. Right shoulder pain - check x-ray right shoulder. #3. Chronic kidney disease stage III  - creatinine is at the baseline we'll continue to monitor.  CODE STATUS  - full code.   Rise Patience 03/28/2011, 8:28 PM

## 2011-03-28 NOTE — ED Notes (Signed)
DR. Conception Oms AT BEDSIDE EVALUATING/ADMITTING PT.

## 2011-03-28 NOTE — ED Provider Notes (Signed)
History     CSN: [id number] Arrival date & time: 03/28/2011  4:06 PM   First MD Initiated Contact with Patient 03/28/11 1638      Chief Complaint  Patient presents with  . Weakness    (Consider location/radiation/quality/duration/timing/severity/associated sxs/prior treatment) Patient is a 45 y.o. male presenting with weakness. The history is provided by the patient.  Weakness Primary symptoms do not include headaches, fever, nausea or vomiting.  Additional symptoms include weakness. Additional symptoms do not include neck stiffness.   patient states that he had generalized weakness and muscle twitching. He states his muscles will cramp. She also feels somewhat fatigued. States she's been urinating a lot. He states his Lasix makes him urinate a lot. He states were urinate up to a gallon a day. He has a history of kidney problems. No chest pain. He does have some right shoulder pain comes and goes. No fevers. No cough. No abdominal pain. No cough. Occasional mild diarrhea. No nausea or vomiting.  Past Medical History  Diagnosis Date  . Hypertension   . Renal disorder     History reviewed. No pertinent past surgical history.  No family history on file.  History  Substance Use Topics  . Smoking status: Not on file  . Smokeless tobacco: Not on file  . Alcohol Use:       Review of Systems  Constitutional: Positive for fatigue. Negative for fever, activity change and appetite change.  HENT: Negative for neck stiffness.   Eyes: Negative for pain.  Respiratory: Negative for cough, chest tightness and shortness of breath.   Cardiovascular: Negative for chest pain and leg swelling.  Gastrointestinal: Negative for nausea, vomiting, abdominal pain and diarrhea.  Genitourinary: Negative for flank pain.  Musculoskeletal: Negative for back pain.  Skin: Negative for rash.  Neurological: Positive for weakness. Negative for numbness and headaches.  Psychiatric/Behavioral: Negative  for behavioral problems.    Allergies  Review of patient's allergies indicates no known allergies.  Home Medications   Current Outpatient Rx  Name Route Sig Dispense Refill  . AMLODIPINE BESYLATE 10 MG PO TABS Oral Take 10 mg by mouth 2 (two) times daily.      Marland Kitchen CLONIDINE HCL 0.2 MG PO TABS Oral Take 0.2 mg by mouth every 6 (six) hours.      . FUROSEMIDE 80 MG PO TABS Oral Take 80 mg by mouth 2 (two) times daily.      Marland Kitchen LABETALOL HCL 200 MG PO TABS Oral Take 400 mg by mouth 2 (two) times daily.      Marland Kitchen POTASSIUM CHLORIDE CRYS CR 20 MEQ PO TBCR Oral Take 40 mEq by mouth 2 (two) times daily.        BP 143/93  Pulse 65  Temp(Src) 97.2 F (36.2 C) (Oral)  Resp 16  SpO2 100%  Physical Exam  Nursing note and vitals reviewed. Constitutional: He is oriented to person, place, and time. He appears well-developed and well-nourished.  HENT:  Head: Normocephalic and atraumatic.       Lips appear very dry.  Eyes: EOM are normal. Pupils are equal, round, and reactive to light.  Neck: Normal range of motion. Neck supple.  Cardiovascular: Normal rate, regular rhythm and normal heart sounds.   No murmur heard. Pulmonary/Chest: Effort normal and breath sounds normal.  Abdominal: Soft. Bowel sounds are normal. He exhibits no distension and no mass. There is no tenderness. There is no rebound and no guarding.  Musculoskeletal: Normal range of motion. He exhibits  no edema.  Neurological: He is alert and oriented to person, place, and time. No cranial nerve deficit.  Skin: Skin is warm and dry.  Psychiatric: He has a normal mood and affect.    ED Course  Procedures (including critical care time)  Labs Reviewed  CBC - Abnormal; Notable for the following:    HCT 38.8 (*)    All other components within normal limits  BASIC METABOLIC PANEL - Abnormal; Notable for the following:    Potassium 2.7 (*)    Glucose, Bld 114 (*)    Creatinine, Ser 1.95 (*)    GFR calc non Af Amer 40 (*)    GFR  calc Af Amer 46 (*)    All other components within normal limits  DIFFERENTIAL  MAGNESIUM  CARDIAC PANEL(CRET KIN+CKTOT+MB+TROPI)   Dg Chest 2 View  03/28/2011  *RADIOLOGY REPORT*  Clinical Data: Chest pain and weakness.  CHEST - 2 VIEW  Comparison: 01/08/2011  Findings: Cardiac enlargement.  Negative for heart failure. Vascularity is normal.  Lungs are clear without infiltrate or effusion.  IMPRESSION: Cardiac enlargement.  No acute abnormality.  Original Report Authenticated By: Truett Perna, M.D.     1. Hypokalemia   2. Renal insufficiency     Date: 03/28/2011  Rate: 69  Rhythm: normal sinus rhythm  QRS Axis: normal  Intervals: normal  ST/T Wave abnormalities: u waves  Conduction Disutrbances:first-degree A-V block   Narrative Interpretation:   Old EKG Reviewed: unchanged     MDM  Patient presents with generalized weakness and fatigue. He is also reportedly having muscle crampin. He has a history of hypokalemia hypertension and renal issues. He states he only sees Dr. Tamala Julian cardiologist. He has a moderate hypokalemia with potassium of 2.7. This is lower than his been before. He doesn't have good primary care followup. She'll be admitted as an observation.   Jasper Riling. Alvino Chapel, MD 03/28/11 [phone]

## 2011-03-28 NOTE — ED Notes (Addendum)
Weakness and muscle twitching. Hx of htn. Complains of being tired.

## 2011-03-28 NOTE — Progress Notes (Signed)
Stat order of potassium not given in ED. Spoke with pharmacist and moved scheduled dose to midnight so that patient would not receive 80 meq at once. Lillia Mountain, RN

## 2011-03-28 NOTE — ED Notes (Signed)
3701-01 READY

## 2011-03-29 DIAGNOSIS — M79609 Pain in unspecified limb: Secondary | ICD-10-CM

## 2011-03-29 LAB — CBC
MCH: 29.9 pg (ref 26.0–34.0)
MCHC: 34.2 g/dL (ref 30.0–36.0)
MCV: 87.4 fL (ref 78.0–100.0)
Platelets: 271 10*3/uL (ref 150–400)
RBC: 4.35 MIL/uL (ref 4.22–5.81)
RDW: 13.5 % (ref 11.5–15.5)

## 2011-03-29 LAB — COMPREHENSIVE METABOLIC PANEL
ALT: 13 U/L (ref 0–53)
AST: 14 U/L (ref 0–37)
Albumin: 3.1 g/dL — ABNORMAL LOW (ref 3.5–5.2)
Alkaline Phosphatase: 80 U/L (ref 39–117)
Chloride: 101 mEq/L (ref 96–112)
Potassium: 3.1 mEq/L — ABNORMAL LOW (ref 3.5–5.1)
Sodium: 139 mEq/L (ref 135–145)
Total Bilirubin: 0.4 mg/dL (ref 0.3–1.2)

## 2011-03-29 MED ORDER — HYDRALAZINE HCL 50 MG PO TABS
50.0000 mg | ORAL_TABLET | Freq: Three times a day (TID) | ORAL | Status: DC
  Administered 2011-03-29 – 2011-03-30 (×2): 50 mg via ORAL
  Filled 2011-03-29 (×5): qty 1

## 2011-03-29 NOTE — Progress Notes (Signed)
Subjective: Patient seen and examined this am. Informs having pain over  rt shoulder on movement. Leg cramps improved  Objective:  Vital signs in last 24 hours:  Filed Vitals:   03/29/11 0500 03/29/11 0512 03/29/11 1050 03/29/11 1400  BP:  132/85 148/87 166/90  Pulse:  66  72  Temp:  98.2 F (36.8 C)  97.6 F (36.4 C)  TempSrc:  Oral  Oral  Resp:  18  20  Height:      Weight: 113.7 kg (250 lb 10.6 oz)     SpO2:  100%  98%    Intake/Output from previous day:   Intake/Output Summary (Last 24 hours) at 03/29/11 1816 Last data filed at 03/29/11 1745  Gross per 24 hour  Intake   1280 ml  Output      1 ml  Net   1279 ml    Physical Exam:  General: middle aged obese  male  in no acute distress. HEENT: no pallor, no icterus, moist oral mucosa, no JVD, no lymphadenopathy Heart: Normal  s1 &s2  Regular rate and rhythm, without murmurs, rubs, gallops. Lungs: Clear to auscultation bilaterally. Abdomen: Soft, nontender, nondistended, positive bowel sounds. Extremities: No clubbing cyanosis or edema with positive pedal pulses. No swelling  Or  tenderness to pressure over rt shoulder, limited ROM above horizontal  Neuro: Alert, awake, oriented x3, nonfocal.   Lab Results:  Basic Metabolic Panel:    Component Value Date/Time   NA 139 03/29/2011 0600   K 3.1* 03/29/2011 0600   CL 101 03/29/2011 0600   CO2 27 03/29/2011 0600   BUN 20 03/29/2011 0600   CREATININE 2.04* 03/29/2011 0600   GLUCOSE 148* 03/29/2011 0600   CALCIUM 9.3 03/29/2011 0600   CBC:    Component Value Date/Time   WBC 7.1 03/29/2011 0600   HGB 13.0 03/29/2011 0600   HCT 38.0* 03/29/2011 0600   PLT 271 03/29/2011 0600   MCV 87.4 03/29/2011 0600   NEUTROABS 4.2 03/28/2011 1654   LYMPHSABS 2.2 03/28/2011 1654   MONOABS 0.3 03/28/2011 1654   EOSABS 0.2 03/28/2011 1654   BASOSABS 0.0 03/28/2011 1654    Recent Results (from the past 240 hour(s))  MRSA PCR SCREENING     Status: Normal   Collection  Time   03/28/11  9:57 PM      Component Value Range Status Comment   MRSA by PCR NEGATIVE  NEGATIVE  Final     Studies/Results: Dg Chest 2 View  03/28/2011  *RADIOLOGY REPORT*  Clinical Data: Chest pain and weakness.  CHEST - 2 VIEW  Comparison: 01/08/2011  Findings: Cardiac enlargement.  Negative for heart failure. Vascularity is normal.  Lungs are clear without infiltrate or effusion.  IMPRESSION: Cardiac enlargement.  No acute abnormality.  Original Report Authenticated By: Truett Perna, M.D.   Dg Shoulder Right  03/29/2011  *RADIOLOGY REPORT*  Clinical Data: Right shoulder pain and weakness.  RIGHT SHOULDER - 2+ VIEW  Comparison: None.  Findings: No displaced acute fracture or dislocation identified. No aggressive appearing osseous lesion.   Right upper lung is clear.  IMPRESSION: No acute osseous abnormality.  Original Report Authenticated By: Suanne Marker, M.D.    Medications: Scheduled Meds:   . amLODipine  10 mg Oral BID  . cloNIDine  0.2 mg Oral Q6H  . furosemide  80 mg Oral BID  . labetalol  400 mg Oral BID  . potassium chloride  10 mEq Intravenous Once  . potassium chloride SA  40 mEq Oral BID  . potassium chloride  40 mEq Oral Once  . sodium chloride  3 mL Intravenous Q12H   Continuous Infusions:  PRN Meds:.acetaminophen, acetaminophen, ondansetron (ZOFRAN) IV, ondansetron  Assessment/Plan: 41 male with hx of uncontrolled HTN , DM, CKD stage 3 , ? CHF and hypokalemia admitted with leg cramps and noted to be hypokalemia. Patient also c/o pain over rt shoulder ad with uncontrolled HTN.  Plan: Uncontrolled HTN with hypokalemia Patient on 5 different type of antihypertensive is upon recent discharge. He has been continued on his blood pressure medications Potassium chloride supplemented on admission Patient was admitted in October for hypertensive urgency. Done at that time for primary hyperaldosteronism showed low plasma renin activity and a high aldosterone /  renin ratio. It showed a normal aldosterone level. 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  however blood pressure and hypokalemia should be resolved before the test as hypokalemia suppresses aldosterone secretion and should avoid meds like aldactone and epleronone.  will request for renal consult  Rt shoulder pain with limited ROM  ? Rotator cuff injury shoulder x ray negative,   PT eval   CKD stage 3  at baseline    DVT prophylaixs    LOS: 1 day   [name] 03/29/2011, 6:16 PM

## 2011-03-29 NOTE — Progress Notes (Signed)
*  PRELIMINARY RESULTS*  Lower Extremity Venous has been performed. Bilateral:  No evidence of DVT or Baker's Cyst.    Landry Mellow RDMS 03/29/2011, 10:03 AM

## 2011-03-30 ENCOUNTER — Observation Stay (HOSPITAL_COMMUNITY): Payer: Medicaid Other

## 2011-03-30 LAB — BASIC METABOLIC PANEL
BUN: 19 mg/dL (ref 6–23)
Chloride: 101 mEq/L (ref 96–112)
Creatinine, Ser: 2.08 mg/dL — ABNORMAL HIGH (ref 0.50–1.35)
GFR calc Af Amer: 43 mL/min — ABNORMAL LOW (ref >90)

## 2011-03-30 LAB — RENAL FUNCTION PANEL
CO2: 26 mEq/L (ref 19–32)
Calcium: 9.6 mg/dL (ref 8.4–10.5)
Creatinine, Ser: 2.03 mg/dL — ABNORMAL HIGH (ref 0.50–1.35)
GFR calc non Af Amer: 38 mL/min — ABNORMAL LOW (ref >90)

## 2011-03-30 LAB — URINALYSIS, ROUTINE W REFLEX MICROSCOPIC
Bilirubin Urine: NEGATIVE
Hgb urine dipstick: NEGATIVE
Specific Gravity, Urine: 1.011 (ref 1.005–1.030)
Urobilinogen, UA: 1 mg/dL (ref 0.0–1.0)
pH: 5.5 (ref 5.0–8.0)

## 2011-03-30 MED ORDER — ACETAMINOPHEN 325 MG PO TABS: 650.0000 mg | ORAL_TABLET | Freq: Four times a day (QID) | ORAL | Status: DC | PRN

## 2011-03-30 MED ORDER — CLONIDINE HCL 0.2 MG PO TABS
0.2000 mg | ORAL_TABLET | Freq: Two times a day (BID) | ORAL | Status: DC
  Administered 2011-03-30 – 2011-03-31 (×2): 0.2 mg via ORAL
  Filled 2011-03-30 (×3): qty 1

## 2011-03-30 NOTE — Progress Notes (Signed)
Physical Therapy Evaluation Patient Details Name: [name] MRN: [id number] DOB: 01-12-1966 Today's Date: 03/30/2011  Problem List:  Patient Active Problem List  Diagnoses  . Hypokalemia  . HTN (hypertension)  . CKD (chronic kidney disease) stage 3, GFR 30-59 ml/min    Past Medical History:  Past Medical History  Diagnosis Date  . Hypertension   . Renal disorder    Past Surgical History:  Past Surgical History  Procedure Date  . No past surgeries     PT Assessment/Plan/Recommendation PT Assessment Clinical Impression Statement: pt presents with Hypokalemia and HTN.  pt is I with all mobility, but notes R shoulder pains and gradually increasing weakness.  Discussed F/U with OPPT for shoulder eval.   PT Recommendation/Assessment: All further PT needs can be met in the next venue of care PT Problem List: Pain PT Therapy Diagnosis : Acute pain PT Recommendation Follow Up Recommendations: Outpatient PT Equipment Recommended: None recommended by PT PT Goals     PT Evaluation Precautions/Restrictions  Restrictions Weight Bearing Restrictions: No Prior Functioning  Home Living Lives With: Spouse Receives Help From: Family Type of Home: House Home Layout: One level Home Access: Stairs to enter [employer] of Steps: 2 Home Adaptive Equipment: None Prior Function Level of Independence: Independent with basic ADLs;Independent with homemaking with ambulation;Independent with gait;Independent with transfers Able to Take Stairs?: Yes Driving: Yes Cognition   Sensation/Coordination   Extremity Assessment RLE Assessment RLE Assessment: Within Functional Limits LLE Assessment LLE Assessment: Within Functional Limits Mobility (including Balance) Bed Mobility Bed Mobility: Yes Supine to Sit: 7: Independent Sitting - Scoot to Edge of Bed: 7: Independent Transfers Transfers: Yes Sit to Stand: 7: Independent Stand to Sit: 7:  Independent Ambulation/Gait Ambulation/Gait: Yes Ambulation/Gait Assistance: 7: Independent Ambulation Distance (Feet): 40 Feet Assistive device: None Gait Pattern: Within Functional Limits Stairs: No Wheelchair Mobility Wheelchair Mobility: No    Exercise    End of Session PT - End of Session Equipment Utilized During Treatment: Gait belt Activity Tolerance: Patient tolerated treatment well Patient left: in chair;with call bell in reach Nurse Communication: Mobility status for transfers General Behavior During Session: [facility] for tasks performed Cognition: [facility] for tasks performed  [name], [name] 03/30/2011, 1:28 PM

## 2011-03-30 NOTE — Consult Note (Signed)
Reason for Consult:HTN. CKD 3 Referring Physician: Dr. Juanda Bond Viruet is an 45 y.o. male.  HPI: 45 yr old male with at least 44 - 7 yr of HTN and varialbe control and meds.  Hosp here 10/12 with mild ^ BP and BP control at home is variable.  Claims compliance with meds. Labetolol, Lasix, K, amlodipine.  FH of HTN, father who died with MI, brother, and mother.  Does not know of renal disease.  Has hx of low K on admit here and in Oct.  He is actually  at [place] with shoulder pain.    Noctuira x 5 -6, ankle edema, SOB at hs.  No D, tremors or NSAIDS.  Has HA on R side about 1x/mon.  No street drugs, stones, UTIs,or Hep.  Wgt fluctuates but usually around 250.  Does not exercise now.  NO CP    Past Medical History  Diagnosis Date  . Hypertension   . Renal disorder     Past Surgical History  Procedure Date  . No past surgeries     Family History  Problem Relation Age of Onset  . Hypertension Mother     Social History:  reports that he has never smoked. He does not have any smokeless tobacco history on file. He reports that he does not drink alcohol. His drug history not on file.  Allergies: No Known Allergies  Medications: I have reviewed the patient's current medications.   Results for orders placed during the hospital encounter of 03/28/11 (from the past 48 hour(s))  CBC     Status: Abnormal   Collection Time   03/28/11  4:54 PM      Component Value Range Comment   WBC 6.9  4.0 - 10.5 (K/uL)    RBC 4.48  4.22 - 5.81 (MIL/uL)    Hemoglobin 13.5  13.0 - 17.0 (g/dL)    HCT 38.8 (*) 39.0 - 52.0 (%)    MCV 86.6  78.0 - 100.0 (fL)    MCH 30.1  26.0 - 34.0 (pg)    MCHC 34.8  30.0 - 36.0 (g/dL)    RDW 13.4  11.5 - 15.5 (%)    Platelets 272  150 - 400 (K/uL)   DIFFERENTIAL     Status: Normal   Collection Time   03/28/11  4:54 PM      Component Value Range Comment   Neutrophils Relative 61  43 - 77 (%)    Neutro Abs 4.2  1.7 - 7.7 (K/uL)    Lymphocytes Relative 31  12 -  46 (%)    Lymphs Abs 2.2  0.7 - 4.0 (K/uL)    Monocytes Relative 5  3 - 12 (%)    Monocytes Absolute 0.3  0.1 - 1.0 (K/uL)    Eosinophils Relative 3  0 - 5 (%)    Eosinophils Absolute 0.2  0.0 - 0.7 (K/uL)    Basophils Relative 0  0 - 1 (%)    Basophils Absolute 0.0  0.0 - 0.1 (K/uL)   BASIC METABOLIC PANEL     Status: Abnormal   Collection Time   03/28/11  4:54 PM      Component Value Range Comment   Sodium 138  135 - 145 (mEq/L)    Potassium 2.7 (*) 3.5 - 5.1 (mEq/L)    Chloride 99  96 - 112 (mEq/L)    CO2 28  19 - 32 (mEq/L)    Glucose, Bld 114 (*) 70 - 99 (mg/dL)  BUN 20  6 - 23 (mg/dL)    Creatinine, Ser 1.95 (*) 0.50 - 1.35 (mg/dL)    Calcium 9.5  8.4 - 10.5 (mg/dL)    GFR calc non Af Amer 40 (*) >90 (mL/min)    GFR calc Af Amer 46 (*) >90 (mL/min)   MAGNESIUM     Status: Normal   Collection Time   03/28/11  4:54 PM      Component Value Range Comment   Magnesium 2.0  1.5 - 2.5 (mg/dL)   CARDIAC PANEL(CRET KIN+CKTOT+MB+TROPI)     Status: Normal   Collection Time   03/28/11  4:54 PM      Component Value Range Comment   Total CK 215  7 - 232 (U/L)    CK, MB 2.1  0.3 - 4.0 (ng/mL)    Troponin I <0.30  <0.30 (ng/mL)    Relative Index 1.0  0.0 - 2.5    MRSA PCR SCREENING     Status: Normal   Collection Time   03/28/11  9:57 PM      Component Value Range Comment   MRSA by PCR NEGATIVE  NEGATIVE    COMPREHENSIVE METABOLIC PANEL     Status: Abnormal   Collection Time   03/29/11  6:00 AM      Component Value Range Comment   Sodium 139  135 - 145 (mEq/L)    Potassium 3.1 (*) 3.5 - 5.1 (mEq/L)    Chloride 101  96 - 112 (mEq/L)    CO2 27  19 - 32 (mEq/L)    Glucose, Bld 148 (*) 70 - 99 (mg/dL)    BUN 20  6 - 23 (mg/dL)    Creatinine, Ser 2.04 (*) 0.50 - 1.35 (mg/dL)    Calcium 9.3  8.4 - 10.5 (mg/dL)    Total Protein 7.2  6.0 - 8.3 (g/dL)    Albumin 3.1 (*) 3.5 - 5.2 (g/dL)    AST 14  0 - 37 (U/L)    ALT 13  0 - 53 (U/L)    Alkaline Phosphatase 80  39 - 117 (U/L)      Total Bilirubin 0.4  0.3 - 1.2 (mg/dL)    GFR calc non Af Amer 38 (*) >90 (mL/min)    GFR calc Af Amer 44 (*) >90 (mL/min)   CBC     Status: Abnormal   Collection Time   03/29/11  6:00 AM      Component Value Range Comment   WBC 7.1  4.0 - 10.5 (K/uL)    RBC 4.35  4.22 - 5.81 (MIL/uL)    Hemoglobin 13.0  13.0 - 17.0 (g/dL)    HCT 38.0 (*) 39.0 - 52.0 (%)    MCV 87.4  78.0 - 100.0 (fL)    MCH 29.9  26.0 - 34.0 (pg)    MCHC 34.2  30.0 - 36.0 (g/dL)    RDW 13.5  11.5 - 15.5 (%)    Platelets 271  150 - 400 (K/uL)   BASIC METABOLIC PANEL     Status: Abnormal   Collection Time   03/30/11  5:45 AM      Component Value Range Comment   Sodium 136  135 - 145 (mEq/L)    Potassium 3.3 (*) 3.5 - 5.1 (mEq/L)    Chloride 101  96 - 112 (mEq/L)    CO2 27  19 - 32 (mEq/L)    Glucose, Bld 119 (*) 70 - 99 (mg/dL)    BUN 19  6 - 23 (mg/dL)    Creatinine, Ser 2.08 (*) 0.50 - 1.35 (mg/dL)    Calcium 9.5  8.4 - 10.5 (mg/dL)    GFR calc non Af Amer 37 (*) >90 (mL/min)    GFR calc Af Amer 43 (*) >90 (mL/min)     Dg Chest 2 View  03/28/2011  *RADIOLOGY REPORT*  Clinical Data: Chest pain and weakness.  CHEST - 2 VIEW  Comparison: 01/08/2011  Findings: Cardiac enlargement.  Negative for heart failure. Vascularity is normal.  Lungs are clear without infiltrate or effusion.  IMPRESSION: Cardiac enlargement.  No acute abnormality.  Original Report Authenticated By: Truett Perna, M.D.   Dg Shoulder Right  03/29/2011  *RADIOLOGY REPORT*  Clinical Data: Right shoulder pain and weakness.  RIGHT SHOULDER - 2+ VIEW  Comparison: None.  Findings: No displaced acute fracture or dislocation identified. No aggressive appearing osseous lesion.   Right upper lung is clear.  IMPRESSION: No acute osseous abnormality.  Original Report Authenticated By: Suanne Marker, M.D.    @ROS @ Blood pressure 120/89, pulse 71, temperature 98 F (36.7 C), temperature source Oral, resp. rate 18, height 5\' 7"  (1.702 m), weight  113.7 kg (250 lb 10.6 oz), SpO2 98.00%. @PHYSEXAMBYAGE2 @ Physical Examination: General appearance - alert, well appearing, and in no distress and overweight Eyes - pupils equal and reactive, extraocular eye movements intact, funduscopic exam abnormal art narrowing and AV nicking Mouth - mucous membranes moist, pharynx normal without lesions Neck - obese, short Lymphatics - post cerv Chest - clear to auscultation, no wheezes, rales or rhonchi, symmetric air entry Heart - normal rate, regular rhythm, normal S1, S2, no murmurs, rubs, clicks or gallops, S1 and S2 normal, no murmurs noted, S4 present, PMI 12 cm lat to MSL Abdomen - hepatomegaly live down 4 cm Extremities - pedal edema 1  +, intact peripheral pulses Skin - normal coloration and turgor, no rashes, no suspicious skin lesions noted No bruits, rashs, L, H, T BP R supine 114/64 to 104/60 standing, L sitting 110/68  Assessment/Plan: 1 CKD most likely HTN, but need to eval renal contribution, R/O RAS also and look for complic 2 HTN puzzling, but most likely genetic. Will W/U for primary Hyperaldo, RAS, ^ cortisol.  Will do W/U as outpatient but start here. P as above  Airrion Otting L 03/30/2011, 3:18 PM

## 2011-03-30 NOTE — Progress Notes (Signed)
UR chart review completed.  

## 2011-03-30 NOTE — Progress Notes (Signed)
Subjective: Patient seen and examined this am. informs his rt shoulder pain to have resolved. Continues to have high BP.  Objective:  Vital signs in last 24 hours:  Filed Vitals:   03/30/11 0500 03/30/11 1000 03/30/11 1001 03/30/11 1400  BP: 136/81 133/91  120/89  Pulse: 77  75 71  Temp: 97.9 F (36.6 C)   98 F (36.7 C)  TempSrc:    Oral  Resp: 20   18  Height:      Weight:      SpO2: 98%   98%    Intake/Output from previous day:   Intake/Output Summary (Last 24 hours) at 03/30/11 1742 Last data filed at 03/30/11 1003  Gross per 24 hour  Intake    483 ml  Output      0 ml  Net    483 ml    Physical Exam:  General: middle aged obese male in no acute distress.  HEENT: no pallor, no icterus, moist oral mucosa, no JVD, no lymphadenopathy  Heart: Normal s1 &s2 Regular rate and rhythm, without murmurs, rubs, gallops.  Lungs: Clear to auscultation bilaterally.  Abdomen: Soft, nontender, nondistended, positive bowel sounds.  Extremities: No clubbing cyanosis or edema with positive pedal pulses. No rt shoulder tenderness with good ROM Neuro: Alert, awake, oriented x3, nonfocal.    Lab Results:  Basic Metabolic Panel:    Component Value Date/Time   NA 135 03/30/2011 1545   K 3.5 03/30/2011 1545   CL 99 03/30/2011 1545   CO2 26 03/30/2011 1545   BUN 20 03/30/2011 1545   CREATININE 2.03* 03/30/2011 1545   GLUCOSE 122* 03/30/2011 1545   CALCIUM 9.6 03/30/2011 1545   CBC:    Component Value Date/Time   WBC 7.1 03/29/2011 0600   HGB 13.0 03/29/2011 0600   HCT 38.0* 03/29/2011 0600   PLT 271 03/29/2011 0600   MCV 87.4 03/29/2011 0600   NEUTROABS 4.2 03/28/2011 1654   LYMPHSABS 2.2 03/28/2011 1654   MONOABS 0.3 03/28/2011 1654   EOSABS 0.2 03/28/2011 1654   BASOSABS 0.0 03/28/2011 1654    Recent Results (from the past 240 hour(s))  MRSA PCR SCREENING     Status: Normal   Collection Time   03/28/11  9:57 PM      Component Value Range Status Comment   MRSA  by PCR NEGATIVE  NEGATIVE  Final     Studies/Results: Dg Shoulder Right  03/29/2011  *RADIOLOGY REPORT*  Clinical Data: Right shoulder pain and weakness.  RIGHT SHOULDER - 2+ VIEW  Comparison: None.  Findings: No displaced acute fracture or dislocation identified. No aggressive appearing osseous lesion.   Right upper lung is clear.  IMPRESSION: No acute osseous abnormality.  Original Report Authenticated By: Suanne Marker, M.D.    Medications: Scheduled Meds:   . amLODipine  10 mg Oral BID  . cloNIDine  0.2 mg Oral BID  . furosemide  80 mg Oral BID  . labetalol  400 mg Oral BID  . potassium chloride SA  40 mEq Oral BID  . sodium chloride  3 mL Intravenous Q12H  . DISCONTD: cloNIDine  0.2 mg Oral Q6H  . DISCONTD: hydrALAZINE  50 mg Oral Q8H   Continuous Infusions:  PRN Meds:.acetaminophen, acetaminophen, ondansetron (ZOFRAN) IV, ondansetron, DISCONTD: acetaminophen  Assessment/Plan: 66 male with hx of uncontrolled HTN , DM, CKD stage 3 ( followed in [place] prior to moving to [place] few months back), ? CHF and hypokalemia admitted with leg cramps and  noted to be hypokalemia. Patient also c/o pain over rt shoulder ad with uncontrolled HTN.   Plan:  Uncontrolled HTN with hypokalemia  Patient on 5 different type of antihypertensive upon recent discharge.  He has been continued on his blood pressure medications  Potassium chloride supplemented on admission  Patient was admitted in October for hypertensive urgency. Done at that time for primary hyperaldosteronism showed low plasma renin activity and a high aldosterone / renin ratio. It showed a normal aldosterone level. 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 however blood pressure and hypokalemia should be resolved before the test as hypokalemia suppresses aldosterone secretion and should avoid meds like aldactone and epleronone.  appreciate renal consult. Plan on checking  doppler to r/o RAS  Rt shoulder pain with limited ROM  shoulder x ray negative,  PT eval  Now resolved  CKD stage 3  at baseline . Do not know if this has progressed over past few months as he new migrant to [employer] [facility] [place] following  DVT prophylaixs       LOS: 2 days   Luanne Krzyzanowski 03/30/2011, 5:42 PM

## 2011-03-31 DIAGNOSIS — I10 Essential (primary) hypertension: Secondary | ICD-10-CM | POA: Diagnosis present

## 2011-03-31 LAB — CORTISOL: Cortisol, Plasma: 4.9 ug/dL

## 2011-03-31 LAB — BASIC METABOLIC PANEL
CO2: 27 mEq/L (ref 19–32)
Chloride: 101 mEq/L (ref 96–112)
Glucose, Bld: 133 mg/dL — ABNORMAL HIGH (ref 70–99)
Potassium: 3 mEq/L — ABNORMAL LOW (ref 3.5–5.1)
Sodium: 138 mEq/L (ref 135–145)

## 2011-03-31 LAB — MAGNESIUM: Magnesium: 2 mg/dL (ref 1.5–2.5)

## 2011-03-31 MED ORDER — POTASSIUM CHLORIDE CRYS ER 20 MEQ PO TBCR: 40.0000 meq | EXTENDED_RELEASE_TABLET | Freq: Three times a day (TID) | ORAL | Status: DC

## 2011-03-31 MED ORDER — POTASSIUM CHLORIDE CRYS ER 20 MEQ PO TBCR
40.0000 meq | EXTENDED_RELEASE_TABLET | Freq: Two times a day (BID) | ORAL | Status: DC
  Administered 2011-03-31: 40 meq via ORAL

## 2011-03-31 MED ORDER — POTASSIUM CHLORIDE CRYS ER 20 MEQ PO TBCR
40.0000 meq | EXTENDED_RELEASE_TABLET | Freq: Once | ORAL | Status: DC
Start: 2011-03-31 — End: 2011-03-31

## 2011-03-31 NOTE — Progress Notes (Signed)
Subjective: Interval History: none.  Objective: Vital signs in last 24 hours: Temp:  [97.7 F (36.5 C)-98.1 F (36.7 C)] 98.1 F (36.7 C) (12/22 0500) Pulse Rate:  [67-98] 67  (12/22 0500) Resp:  [18-20] 20  (12/22 0500) BP: (120-143)/(80-91) 130/83 mmHg (12/22 0500) SpO2:  [97 %-98 %] 97 % (12/22 0500) Weight:  [116.1 kg (255 lb 15.3 oz)] 255 lb 15.3 oz (116.1 kg) (12/22 0500) Weight change:   Intake/Output from previous day: 12/21 0701 - 12/22 0700 In: 603 [P.O.:600; I.V.:3] Out: -  Intake/Output this shift:    General appearance: alert, cooperative and moderately obese Resp: clear to auscultation bilaterally Cardio: S1, S2 normal, S4 present and systolic murmur: holosystolic 2/6, blowing at apex GI: obese, live down 4 cm  Lab Results:  Basename 03/29/11 0600 03/28/11 1654  WBC 7.1 6.9  HGB 13.0 13.5  HCT 38.0* 38.8*  PLT 271 272   BMET:  Basename 03/31/11 0630 03/30/11 1545  NA 138 135  K 3.0* 3.5  CL 101 99  CO2 27 26  GLUCOSE 133* 122*  BUN 23 20  CREATININE 2.13* 2.03*  CALCIUM 9.5 9.6   No results found for this basename: PTH:2 in the last 72 hours Iron Studies: No results found for this basename: IRON,TIBC,TRANSFERRIN,FERRITIN in the last 72 hours  Studies/Results: US Renal  03/30/2011  *RADIOLOGY REPORT*  Clinical Data: Hypertension.  Chronic kidney disease.  RENAL / URINARY TRACT ULTRASOUND  Technique:  Complete ultrasound exam of the kidneys and urinary bladder was performed.  Comparison: No comparison studies available.  Findings:  The right kidney measures 10.8 cm in long axis.  The left kidney measures 9.9 cm.  Renal cortical echogenicity may be a mildly increased bilaterally.  There is no hydronephrosis.  Question 10 mm hypoechoic cortical lesion in the lower pole of the left kidney.  The bladder is unremarkable with bilateral ureteral jets visualized.  Impression:  No evidence for hydronephrosis.  Question 10 mm hypoechoic lesion in the lower pole  of the left kidney.  This is not clearly discriminated on today's study. Dedicated renal MRI could be used to further evaluate as clinically warranted.  Original Report Authenticated By: ERIC A. MANSELL, M.D.    I have reviewed the patient's current medications.  Assessment/Plan: 1 CKD/ HTN scarred kidneys and assymmetric.  Will w/U outpatient.Alos need some 24 h urines. 2 low K replete 3HTN controlled  P Will see as outpatient    LOS: 3 days   Barbara Keng L 03/31/2011,8:01 AM

## 2011-03-31 NOTE — Discharge Summary (Signed)
Patient ID: Chinedum Wenig MRN: [id number] DOB/AGE: Jun 17, 1965 45 y.o.  Admit date: 03/28/2011 Discharge date: 03/31/2011  Primary Care Physician:  Reymundo Poll, MD Primary cardiologist: Dr Daneen Schick  Discharge Diagnoses:    Present on Admission:  .Hypokalemia .Severe uncontrolled hypertension .CKD (chronic kidney disease) stage 3, GFR 30-59 ml/min . Rt shoulder pain . ? Hx of CHF    Current Discharge Medication List    CONTINUE these medications which have CHANGED   Details  potassium chloride SA (K-DUR,KLOR-CON) 20 MEQ tablet Take 2 tablets (40 mEq total) by mouth 3 (three) times daily. Qty: 90 tablet, Refills: 0      CONTINUE these medications which have NOT CHANGED   Details  amLODipine (NORVASC) 10 MG tablet Take 10 mg by mouth 2 (two) times daily.      cloNIDine (CATAPRES) 0.2 MG tablet Take 0.2 mg by mouth every 6 (six) hours.      furosemide (LASIX) 80 MG tablet Take 80 mg by mouth 2 (two) times daily.      labetalol (NORMODYNE) 200 MG tablet Take 400 mg by mouth 2 (two) times daily.          Disposition and Follow-up:  Follow up with PCP in 1 week  follow up with Dr Jimmy Footman in 2 weeks  Consults:   Deterding ( renal)  Significant Diagnostic Studies:  Dg Chest 2 View  03/28/2011  *RADIOLOGY REPORT*  Clinical Data: Chest pain and weakness.  CHEST - 2 VIEW  Comparison: 01/08/2011  Findings: Cardiac enlargement.  Negative for heart failure. Vascularity is normal.  Lungs are clear without infiltrate or effusion.  IMPRESSION: Cardiac enlargement.  No acute abnormality.  Original Report Authenticated By: Truett Perna, M.D.   Dg Shoulder Right  03/29/2011  *RADIOLOGY REPORT*  Clinical Data: Right shoulder pain and weakness.  RIGHT SHOULDER - 2+ VIEW  Comparison: None.  Findings: No displaced acute fracture or dislocation identified. No aggressive appearing osseous lesion.   Right upper lung is clear.  IMPRESSION: No acute osseous abnormality.  Original  Report Authenticated By: Suanne Marker, M.D.    Brief H and P: For complete details please refer to admission H and P, but in brief 45 year-old male with chronic kidney disease stage III, hypertension present in the ER with increasing pain in the right shoulder which has worsened over the last one week. In addition patient also noticed cramps in his lower extremities bilaterally. Patient states that his right shoulder pain has been there for last 2 months but worsened over the last one week when he has pain on trying to lift anything. Denies any trauma or fall. In addition patient has noticed cramps in both calves. Denies any chest pain shortness of breath cough or phlegm fever chills nausea vomiting or mouth pain or diarrhea. In the ER patient was found to have a potassium of 2.7 patient and admit for further observation.      Physical Exam on Discharge:  Filed Vitals:   03/30/11 2100 03/30/11 2129 03/31/11 0500 03/31/11 1016  BP: 143/80 143/80 130/83 128/84  Pulse: 74 98 67   Temp: 97.7 F (36.5 C)  98.1 F (36.7 C)   TempSrc: Oral  Oral   Resp: 20  20   Height:      Weight:   116.1 kg (255 lb 15.3 oz)   SpO2: 98%  97%      Intake/Output Summary (Last 24 hours) at 03/31/11 1124 Last data filed at 03/30/11 2140  Gross  per 24 hour  Intake    600 ml  Output      0 ml  Net    600 ml    General: middle aged obese male in no acute distress.  HEENT: no pallor, no icterus, moist oral mucosa, no JVD, no lymphadenopathy  Heart: Normal s1 &s2 Regular rate and rhythm, without murmurs, rubs, gallops.  Lungs: Clear to auscultation bilaterally.  Abdomen: Soft, nontender, nondistended, positive bowel sounds.  Extremities: No clubbing cyanosis or edema with positive pedal pulses. No rt shoulder tenderness with good ROM  Neuro: Alert, awake, oriented x3, nonfocal.  CBC:    Component Value Date/Time   WBC 7.1 03/29/2011 0600   HGB 13.0 03/29/2011 0600   HCT 38.0* 03/29/2011 0600    PLT 271 03/29/2011 0600   MCV 87.4 03/29/2011 0600   NEUTROABS 4.2 03/28/2011 1654   LYMPHSABS 2.2 03/28/2011 1654   MONOABS 0.3 03/28/2011 1654   EOSABS 0.2 03/28/2011 1654   BASOSABS 0.0 03/28/2011 [id number]    Basic Metabolic Panel:    Component Value Date/Time   NA 138 03/31/2011 0630   K 3.0* 03/31/2011 0630   CL 101 03/31/2011 0630   CO2 27 03/31/2011 0630   BUN 23 03/31/2011 0630   CREATININE 2.13* 03/31/2011 0630   GLUCOSE 133* 03/31/2011 0630   CALCIUM 9.5 03/31/2011 0630    Hospital Course:   Uncontrolled HTN with hypokalemia  Patient on 4 different type of antihypertensive upon recent discharge.  He has been continued on his blood pressure medications  Potassium chloride supplemented daily. He is on 40 meq bid at home and level still low . i am increasing dose to 40 tid on discharge Patient was admitted in October for hypertensive urgency. Workup done at that time for primary hyperaldosteronism showed low plasma renin activity and a high aldosterone / renin ratio. It showed a normal aldosterone level. 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 however blood pressure and hypokalemia should be resolved before the test as hypokalemia suppresses aldosterone secretion and should avoid meds like aldactone and epleronone.  appreciate renal consult. Seen by Dr Deterding who will follow him up as outp for further work up. US kidneys done to check for RAS. Shows no hydronephrosis but increased renal cortical echogenicity.   Rt shoulder pain with limited ROM  shoulder x ray negative,  PT eval done and stable Now resolved   CKD stage 3  Seems at baseline . Do not know if this has progressed over past few months as he is a new migrant to [employer]  Dr Deterding following and recommend outpt follow up    Time spent on Discharge: 30 minutes  Signed: Louellen Molder 03/31/2011, 11:24 AM

## 2011-04-02 NOTE — Progress Notes (Signed)
   CARE MANAGEMENT NOTE 04/02/2011  Patient:  [name],[name]   Account Number:  [id number]  Date Initiated:  04/02/2011  Documentation initiated by:  Lars Pinks  Subjective/Objective Assessment:   PT WAS ADMITTED WITH ELECTROLYTE IMBALANCES     Action/Plan:   PROGRESSION OF CARE AND DISCHARGE PLANNING   Anticipated DC Date:  03/31/2011   Anticipated DC Plan:  [place]  CM consult      Choice offered to / List presented to:             Status of service:  Completed, signed off Medicare Important Message given?   (If response is "NO", the following Medicare IM given date fields will be blank) Date Medicare IM given:   Date Additional Medicare IM given:    Discharge Disposition:  HOME/SELF CARE  Per UR Regulation:  Reviewed for med. necessity/level of care/duration of stay  Comments:  04/02/2011 Lars Pinks, RN, BSN [place]

## 2011-04-05 LAB — ALDOSTERONE + RENIN ACTIVITY W/ RATIO
ALDO / PRA Ratio: 56.3 Ratio — ABNORMAL HIGH (ref 0.9–28.9)
Aldosterone: 9 ng/dL

## 2011-06-13 ENCOUNTER — Other Ambulatory Visit: Payer: Self-pay | Admitting: Nephrology

## 2011-06-13 DIAGNOSIS — E876 Hypokalemia: Secondary | ICD-10-CM

## 2011-06-13 DIAGNOSIS — I10 Essential (primary) hypertension: Secondary | ICD-10-CM

## 2011-06-18 ENCOUNTER — Ambulatory Visit
Admission: RE | Admit: 2011-06-18 | Discharge: 2011-06-18 | Disposition: A | Discharge status: Home / Self Care | Payer: BC Managed Care – PPO | Source: Ambulatory Visit | Attending: Nephrology | Admitting: Nephrology

## 2011-06-18 DIAGNOSIS — I10 Essential (primary) hypertension: Secondary | ICD-10-CM

## 2011-06-18 DIAGNOSIS — E876 Hypokalemia: Secondary | ICD-10-CM

## 2011-10-11 ENCOUNTER — Emergency Department (HOSPITAL_COMMUNITY)
Admission: EM | Admit: 2011-10-11 | Discharge: 2011-10-11 | Disposition: A | Discharge status: Home / Self Care | Payer: Self-pay | Attending: Emergency Medicine | Admitting: Emergency Medicine

## 2011-10-11 ENCOUNTER — Emergency Department (HOSPITAL_COMMUNITY): Payer: Self-pay

## 2011-10-11 ENCOUNTER — Encounter (HOSPITAL_COMMUNITY): Payer: Self-pay

## 2011-10-11 DIAGNOSIS — G4730 Sleep apnea, unspecified: Secondary | ICD-10-CM | POA: Insufficient documentation

## 2011-10-11 DIAGNOSIS — R0602 Shortness of breath: Secondary | ICD-10-CM | POA: Insufficient documentation

## 2011-10-11 DIAGNOSIS — I10 Essential (primary) hypertension: Secondary | ICD-10-CM | POA: Insufficient documentation

## 2011-10-11 HISTORY — DX: Sleep apnea, unspecified: G47.30

## 2011-10-11 LAB — URINALYSIS, ROUTINE W REFLEX MICROSCOPIC
Hgb urine dipstick: NEGATIVE
Nitrite: NEGATIVE
Specific Gravity, Urine: 1.024 (ref 1.005–1.030)
Urobilinogen, UA: 0.2 mg/dL (ref 0.0–1.0)
pH: 5 (ref 5.0–8.0)

## 2011-10-11 LAB — COMPREHENSIVE METABOLIC PANEL
Albumin: 3.4 g/dL — ABNORMAL LOW (ref 3.5–5.2)
Alkaline Phosphatase: 88 U/L (ref 39–117)
BUN: 24 mg/dL — ABNORMAL HIGH (ref 6–23)
CO2: 26 mEq/L (ref 19–32)
Chloride: 101 mEq/L (ref 96–112)
GFR calc non Af Amer: 36 mL/min — ABNORMAL LOW (ref >90)
Potassium: 3.6 mEq/L (ref 3.5–5.1)
Total Bilirubin: 0.2 mg/dL — ABNORMAL LOW (ref 0.3–1.2)

## 2011-10-11 LAB — CBC WITH DIFFERENTIAL/PLATELET
Basophils Relative: 0 % (ref 0–1)
HCT: 40.7 % (ref 39.0–52.0)
Hemoglobin: 13.9 g/dL (ref 13.0–17.0)
Lymphocytes Relative: 28 % (ref 12–46)
Lymphs Abs: 2.4 10*3/uL (ref 0.7–4.0)
MCHC: 34.2 g/dL (ref 30.0–36.0)
Monocytes Absolute: 0.4 10*3/uL (ref 0.1–1.0)
Monocytes Relative: 5 % (ref 3–12)
Neutro Abs: 5.3 10*3/uL (ref 1.7–7.7)
Neutrophils Relative %: 64 % (ref 43–77)
RBC: 4.59 MIL/uL (ref 4.22–5.81)
WBC: 8.3 10*3/uL (ref 4.0–10.5)

## 2011-10-11 LAB — RAPID URINE DRUG SCREEN, HOSP PERFORMED
Barbiturates: NOT DETECTED
Opiates: NOT DETECTED
Tetrahydrocannabinol: NOT DETECTED

## 2011-10-11 MED ORDER — LABETALOL HCL 5 MG/ML IV SOLN
20.0000 mg | INTRAVENOUS | Status: DC | PRN
  Administered 2011-10-11 (×2): 20 mg via INTRAVENOUS
  Filled 2011-10-11 (×2): qty 4

## 2011-10-11 NOTE — ED Notes (Signed)
[id number] Expected date:<BR> Expected time:<BR> Means of arrival:<BR> Comments:<BR> EMS

## 2011-10-11 NOTE — ED Notes (Signed)
Pt made level 2 acuity d/t pt being lethargic on arrival.  Also extreme elevated b/p.  Protocols initiated.

## 2011-10-11 NOTE — ED Notes (Signed)
Per EMS, pt went to lay down tonight and felt short of breath.  No chest pain.  No cough.  No fever.  Has hx HTN, Renal Failure., states compliance with meds. However, b/p extremely high in route.  240/160, hr 82, 98% ra.  IV in left AC, 18 g.  Wears CPAP.  Pt placed on oxygen in route for comfort.  ECG NSR in route.

## 2011-10-11 NOTE — ED Provider Notes (Signed)
History     CSN: [id number]  Arrival date & time 10/11/11  0023   First MD Initiated Contact with Patient 10/11/11 0202      Chief Complaint  Patient presents with  . Shortness of Breath  . Hypertension    (Consider location/radiation/quality/duration/timing/severity/associated sxs/prior treatment) HPI Comments: [name] is a 46 y.o. Male who presents with shortness of breath, when trying to go to sleep tonight. Recently, he has had, generalized itching. He also complains of dizziness today. He was transferred by EMS. They documented a blood sugar 240/160. History of oxygen. He arrives with improved, blood pressure. He denies chest pain, weakness, nausea, or vomiting. He, states he's been taking his medications regularly. He does not know of any aggravating or alleviating factors.  Patient is a 46 y.o. male presenting with shortness of breath and hypertension. The history is provided by the patient.  Shortness of Breath  Associated symptoms include shortness of breath.  Hypertension Associated symptoms include shortness of breath.    Past Medical History  Diagnosis Date  . Hypertension   . Renal disorder   . Sleep apnea     Past Surgical History  Procedure Date  . No past surgeries     Family History  Problem Relation Age of Onset  . Hypertension Mother     History  Substance Use Topics  . Smoking status: Never Smoker   . Smokeless tobacco: Not on file  . Alcohol Use: No      Review of Systems  Respiratory: Positive for shortness of breath.   All other systems reviewed and are negative.    Allergies  Review of patient's allergies indicates no known allergies.  Home Medications   Current Outpatient Rx  Name Route Sig Dispense Refill  . AMLODIPINE BESYLATE 10 MG PO TABS Oral Take 10 mg by mouth 2 (two) times daily.      Marland Kitchen CLONIDINE HCL 0.2 MG PO TABS Oral Take 0.2 mg by mouth every 6 (six) hours.      . FUROSEMIDE 40 MG PO TABS Oral Take 40 mg by  mouth daily.    Marland Kitchen LABETALOL HCL 200 MG PO TABS Oral Take 400 mg by mouth 2 (two) times daily.      Marland Kitchen POTASSIUM GLUCONATE 595 MG PO TABS Oral Take 1,190 mg by mouth 2 (two) times daily.    Marland Kitchen SPIRONOLACTONE 25 MG PO TABS Oral Take 25 mg by mouth 2 (two) times daily.      BP 152/102  Pulse 70  Temp 97.8 F (36.6 C) (Oral)  Resp 16  SpO2 100%  Physical Exam  Nursing note and vitals reviewed. Constitutional: He is oriented to person, place, and time. He appears well-developed and well-nourished.  HENT:  Head: Normocephalic and atraumatic.  Right Ear: External ear normal.  Left Ear: External ear normal.  Eyes: Conjunctivae and EOM are normal. Pupils are equal, round, and reactive to light.  Neck: Normal range of motion and phonation normal. Neck supple.  Cardiovascular: Normal rate, regular rhythm, normal heart sounds and intact distal pulses.   Pulmonary/Chest: Effort normal and breath sounds normal. He exhibits no bony tenderness.  Abdominal: Soft. Normal appearance. There is no tenderness.  Musculoskeletal: Normal range of motion.  Neurological: He is alert and oriented to person, place, and time. He has normal strength. No cranial nerve deficit or sensory deficit. He exhibits normal muscle tone. Coordination normal.  Skin: Skin is warm, dry and intact.  Psychiatric: He has a normal  mood and affect. His behavior is normal. Judgment and thought content normal.    ED Course  Procedures (including critical care time)  Patient given initial dose of labetalol at 0237 hours. His slow improvement with pressure to 142/98 at 0430 hours. At 0448 hours. His blood pressure had increased to 150/110; second dose of labetalol was ordered.  Recheck blood pressure- 155/103   Labs Reviewed  COMPREHENSIVE METABOLIC PANEL - Abnormal; Notable for the following:    Glucose, Bld 138 (*)     BUN 24 (*)     Creatinine, Ser 2.13 (*)     Albumin 3.4 (*)     Total Bilirubin 0.2 (*)     GFR calc non Af  Amer 36 (*)     GFR calc Af Amer 42 (*)     All other components within normal limits  CBC WITH DIFFERENTIAL  URINE RAPID DRUG SCREEN (HOSP PERFORMED)  URINALYSIS, ROUTINE W REFLEX MICROSCOPIC   Dg Chest 2 View  10/11/2011  *RADIOLOGY REPORT*  Clinical Data: Shortness of breath, chest pain, hypertension.  CHEST - 2 VIEW  Comparison: 03/28/2011  Findings: Heart is borderline in size.  Lungs are clear.  No effusions or edema.  No acute bony abnormality.  IMPRESSION: Borderline heart size.  No acute findings.  Original Report Authenticated By: Raelyn Number, M.D.     1. Hypertension       MDM  Nonspecific, shortness of breath, with hypertension. No evidence for hypertensive crisis. Patient has no chest pain. His creatinine, is at baseline. He has not had a recent adjustments to his blood pressure medication. Doubt CHF, metabolic instability, serious bacterial infection or impending vascular collapse; the patient is stable for discharge.   Plan: Home Medications- usual; Home Treatments- rest; Recommended follow up- Nephrology asap to consider medication adjustment     Richarda Blade, MD 10/11/11 [phone]

## 2012-03-14 ENCOUNTER — Emergency Department (HOSPITAL_COMMUNITY): Payer: Self-pay

## 2012-03-14 ENCOUNTER — Emergency Department (HOSPITAL_COMMUNITY)
Admission: EM | Admit: 2012-03-14 | Discharge: 2012-03-15 | Disposition: A | Discharge status: Home / Self Care | Payer: Self-pay | Attending: Emergency Medicine | Admitting: Emergency Medicine

## 2012-03-14 ENCOUNTER — Encounter (HOSPITAL_COMMUNITY): Payer: Self-pay | Admitting: *Deleted

## 2012-03-14 DIAGNOSIS — Z87448 Personal history of other diseases of urinary system: Secondary | ICD-10-CM | POA: Insufficient documentation

## 2012-03-14 DIAGNOSIS — Z9981 Dependence on supplemental oxygen: Secondary | ICD-10-CM | POA: Insufficient documentation

## 2012-03-14 DIAGNOSIS — R059 Cough, unspecified: Secondary | ICD-10-CM | POA: Insufficient documentation

## 2012-03-14 DIAGNOSIS — R05 Cough: Secondary | ICD-10-CM | POA: Insufficient documentation

## 2012-03-14 DIAGNOSIS — R109 Unspecified abdominal pain: Secondary | ICD-10-CM | POA: Insufficient documentation

## 2012-03-14 DIAGNOSIS — R079 Chest pain, unspecified: Secondary | ICD-10-CM | POA: Insufficient documentation

## 2012-03-14 DIAGNOSIS — I10 Essential (primary) hypertension: Secondary | ICD-10-CM

## 2012-03-14 DIAGNOSIS — G4730 Sleep apnea, unspecified: Secondary | ICD-10-CM | POA: Insufficient documentation

## 2012-03-14 DIAGNOSIS — Z79899 Other long term (current) drug therapy: Secondary | ICD-10-CM | POA: Insufficient documentation

## 2012-03-14 HISTORY — DX: Dependence on other enabling machines and devices: Z99.89

## 2012-03-14 LAB — COMPREHENSIVE METABOLIC PANEL
ALT: 17 U/L (ref 0–53)
AST: 20 U/L (ref 0–37)
Alkaline Phosphatase: 80 U/L (ref 39–117)
CO2: 26 mEq/L (ref 19–32)
Chloride: 97 mEq/L (ref 96–112)
GFR calc non Af Amer: 30 mL/min — ABNORMAL LOW (ref >90)
Potassium: 4 mEq/L (ref 3.5–5.1)
Sodium: 135 mEq/L (ref 135–145)
Total Bilirubin: 0.3 mg/dL (ref 0.3–1.2)

## 2012-03-14 LAB — URINALYSIS, ROUTINE W REFLEX MICROSCOPIC
Glucose, UA: NEGATIVE mg/dL
Hgb urine dipstick: NEGATIVE
Ketones, ur: NEGATIVE mg/dL
Protein, ur: 30 mg/dL — AB

## 2012-03-14 LAB — RAPID URINE DRUG SCREEN, HOSP PERFORMED
Amphetamines: NOT DETECTED
Opiates: NOT DETECTED
Tetrahydrocannabinol: NOT DETECTED

## 2012-03-14 LAB — URINE MICROSCOPIC-ADD ON

## 2012-03-14 LAB — CBC
Platelets: 296 10*3/uL (ref 150–400)
RBC: 4.64 MIL/uL (ref 4.22–5.81)
WBC: 7.9 10*3/uL (ref 4.0–10.5)

## 2012-03-14 MED ORDER — AMLODIPINE BESYLATE 10 MG PO TABS
10.0000 mg | ORAL_TABLET | Freq: Once | ORAL | Status: AC
  Administered 2012-03-14: 10 mg via ORAL
  Filled 2012-03-14: qty 1

## 2012-03-14 MED ORDER — CLONIDINE HCL 0.1 MG PO TABS
0.2000 mg | ORAL_TABLET | Freq: Once | ORAL | Status: AC
  Administered 2012-03-14: 0.2 mg via ORAL
  Filled 2012-03-14: qty 2

## 2012-03-14 MED ORDER — SODIUM CHLORIDE 0.9 % IV SOLN: INTRAVENOUS | Status: DC

## 2012-03-14 NOTE — ED Notes (Signed)
Pt states his blood pressure had been up x 1 week, states yesterday "it was 260/180, my wife is a CNA and has been taking it". Pt states has a headache and side pain. Pt also states this past Saturday "I think I was peeing blood but it wasn't a lot". Pt states been having "tingling" when urinating.

## 2012-03-14 NOTE — ED Notes (Signed)
Pt's mother took his BP around 5:30pm tonight.  It was 172/123.  Pt's mother reports he is feeling weak and has chest pains.

## 2012-03-14 NOTE — ED Provider Notes (Addendum)
History     CSN: [id number]  Arrival date & time 03/14/12  1759   First MD Initiated Contact with Patient 03/14/12 1959      Chief Complaint  Patient presents with  . Hypertension  . Flank Pain    (Consider location/radiation/quality/duration/timing/severity/associated sxs/prior treatment) HPI Pt w hx chronic, severe htn, chronically elevated bps, and renal insuff presents stating his bp has been high for past several days. Denies headache. No change in vision or speech. No numbness/weakness. States chest had been tight for past day, constant, without exacerbating or alleviating factors. No episode cp. No unusual doe or fatigue. Denies associated nv, diaphoresis or sob. No leg swelling or pain. States compliant w meds. Denies change in meds. Denies substance abuse. No fever or chills. Occasional non productive cough.     Past Medical History  Diagnosis Date  . Hypertension   . Renal disorder   . Sleep apnea   . CPAP (continuous positive airway pressure) dependence     Past Surgical History  Procedure Date  . No past surgeries     Family History  Problem Relation Age of Onset  . Hypertension Mother     History  Substance Use Topics  . Smoking status: Never Smoker   . Smokeless tobacco: Never Used  . Alcohol Use: No      Review of Systems  Constitutional: Negative for fever and chills.  HENT: Negative for neck pain.   Eyes: Negative for visual disturbance.  Respiratory: Positive for cough. Negative for shortness of breath.   Cardiovascular: Positive for chest pain. Negative for palpitations and leg swelling.  Gastrointestinal: Negative for vomiting and abdominal pain.  Genitourinary: Negative for flank pain.  Musculoskeletal: Negative for back pain.  Skin: Negative for rash.  Neurological: Negative for weakness, numbness and headaches.  Hematological: Does not bruise/bleed easily.  Psychiatric/Behavioral: Negative for confusion.    Allergies  Review of  patient's allergies indicates no known allergies.  Home Medications   Current Outpatient Rx  Name  Route  Sig  Dispense  Refill  . AMLODIPINE BESYLATE 10 MG PO TABS   Oral   Take 10 mg by mouth 2 (two) times daily.           Marland Kitchen CLONIDINE HCL 0.2 MG PO TABS   Oral   Take 0.2 mg by mouth every 6 (six) hours.           . FUROSEMIDE 40 MG PO TABS   Oral   Take 40 mg by mouth daily.         Marland Kitchen POTASSIUM GLUCONATE 595 MG PO TABS   Oral   Take 1,190 mg by mouth 2 (two) times daily.         Marland Kitchen SPIRONOLACTONE 25 MG PO TABS   Oral   Take 25 mg by mouth daily.            BP 165/121  Pulse 74  Temp 98.4 F (36.9 C) (Oral)  Resp 18  SpO2 100%  Physical Exam  Nursing note and vitals reviewed. Constitutional: He is oriented to person, place, and time. He appears well-developed and well-nourished. No distress.  HENT:  Head: Atraumatic.  Mouth/Throat: Oropharynx is clear and moist.  Eyes: Pupils are equal, round, and reactive to light.  Neck: Neck supple. No tracheal deviation present.  Cardiovascular: Normal rate, regular rhythm, normal heart sounds and intact distal pulses.   Pulmonary/Chest: Effort normal and breath sounds normal. No accessory muscle usage. No respiratory distress.  He exhibits no tenderness.  Abdominal: Soft. Bowel sounds are normal. He exhibits no distension. There is no tenderness.  Musculoskeletal: Normal range of motion. He exhibits no edema and no tenderness.  Neurological: He is alert and oriented to person, place, and time.       Motor intact bil.   Skin: Skin is warm and dry.  Psychiatric: He has a normal mood and affect.    ED Course  Procedures (including critical care time)  Results for orders placed during the hospital encounter of 03/14/12  CBC      Component Value Range   WBC 7.9  4.0 - 10.5 K/uL   RBC 4.64  4.22 - 5.81 MIL/uL   Hemoglobin 14.3  13.0 - 17.0 g/dL   HCT 41.2  39.0 - 52.0 %   MCV 88.8  78.0 - 100.0 fL   MCH 30.8   26.0 - 34.0 pg   MCHC 34.7  30.0 - 36.0 g/dL   RDW 13.0  11.5 - 15.5 %   Platelets 296  150 - 400 K/uL  COMPREHENSIVE METABOLIC PANEL      Component Value Range   Sodium 135  135 - 145 mEq/L   Potassium 4.0  3.5 - 5.1 mEq/L   Chloride 97  96 - 112 mEq/L   CO2 26  19 - 32 mEq/L   Glucose, Bld 115 (*) 70 - 99 mg/dL   BUN 28 (*) 6 - 23 mg/dL   Creatinine, Ser 2.49 (*) 0.50 - 1.35 mg/dL   Calcium 10.2  8.4 - 10.5 mg/dL   Total Protein 8.0  6.0 - 8.3 g/dL   Albumin 3.7  3.5 - 5.2 g/dL   AST 20  0 - 37 U/L   ALT 17  0 - 53 U/L   Alkaline Phosphatase 80  39 - 117 U/L   Total Bilirubin 0.3  0.3 - 1.2 mg/dL   GFR calc non Af Amer 30 (*) >90 mL/min   GFR calc Af Amer 34 (*) >90 mL/min  TROPONIN I      Component Value Range   Troponin I <0.30  <0.30 ng/mL  URINALYSIS, ROUTINE W REFLEX MICROSCOPIC      Component Value Range   Color, Urine YELLOW  YELLOW   APPearance CLEAR  CLEAR   Specific Gravity, Urine 1.022  1.005 - 1.030   pH 5.0  5.0 - 8.0   Glucose, UA NEGATIVE  NEGATIVE mg/dL   Hgb urine dipstick NEGATIVE  NEGATIVE   Bilirubin Urine NEGATIVE  NEGATIVE   Ketones, ur NEGATIVE  NEGATIVE mg/dL   Protein, ur 30 (*) NEGATIVE mg/dL   Urobilinogen, UA 0.2  0.0 - 1.0 mg/dL   Nitrite NEGATIVE  NEGATIVE   Leukocytes, UA NEGATIVE  NEGATIVE  URINE RAPID DRUG SCREEN (HOSP PERFORMED)      Component Value Range   Opiates NONE DETECTED  NONE DETECTED   Cocaine NONE DETECTED  NONE DETECTED   Benzodiazepines NONE DETECTED  NONE DETECTED   Amphetamines NONE DETECTED  NONE DETECTED   Tetrahydrocannabinol NONE DETECTED  NONE DETECTED   Barbiturates NONE DETECTED  NONE DETECTED  URINE MICROSCOPIC-ADD ON      Component Value Range   Squamous Epithelial / LPF RARE  RARE   WBC, UA 0-2  <3 WBC/hpf   Bacteria, UA RARE  RARE   Casts HYALINE CASTS (*) NEGATIVE  TROPONIN I      Component Value Range   Troponin I <0.30  <0.30 ng/mL  Dg Chest 2 View  03/14/2012  *RADIOLOGY REPORT*  Clinical  Data: Chest pain.  Shortness of breath.  Hypertension.  CHEST - 2 VIEW  Comparison: 10/11/2011.  Findings: A poor inspiration is again demonstrated with borderline enlargement of the cardiac silhouette.  Clear lungs with normal vascularity.  Thoracic spine degenerative changes.  IMPRESSION: No acute abnormality.   Original Report Authenticated By: Claudie Revering, M.D.       MDM  Iv ns. Labs. Cxr.  Reviewed nursing notes and prior charts for additional history.   Pt states has not yet taken his evening dose of clonidine, otherwise, has been compliant w meds.   pts home dose clonidine given in ed.    Date: 03/14/2012  Rate: 74  Rhythm: normal sinus rhythm  QRS Axis: normal  Intervals: normal  ST/T Wave abnormalities: normal  Conduction Disutrbances:none  Narrative Interpretation:   Old EKG Reviewed: unchanged   Pt also indicates has not taken his pm dose of norvasc, will give that dose as well.  Recheck no headache. No cp. No sob.   After being given his normal doses of his meds, bp improved. Denies headache, no cp, no sob. Marland Kitchen   Pt w hx renal insuff on prior labs.  After constant symptoms x >24 hrs, trop x 2 normal.   Discussed need close pcp f/u, recheck bp, recheck renal fxn.   Pt states has adequate of his meds at home.        Mirna Mires, MD 03/14/12 [place], MD 03/15/12 [phone]

## 2012-03-15 LAB — TROPONIN I: Troponin I: <0.3 ng/mL (ref <0.30)

## 2012-05-14 DIAGNOSIS — E669 Obesity, unspecified: Secondary | ICD-10-CM | POA: Insufficient documentation

## 2012-05-14 DIAGNOSIS — G4730 Sleep apnea, unspecified: Secondary | ICD-10-CM | POA: Insufficient documentation

## 2012-05-14 DIAGNOSIS — E66812 Obesity, class 2: Secondary | ICD-10-CM | POA: Insufficient documentation

## 2012-05-30 ENCOUNTER — Emergency Department (HOSPITAL_COMMUNITY): Payer: BC Managed Care – PPO

## 2012-05-30 ENCOUNTER — Encounter (HOSPITAL_COMMUNITY): Payer: Self-pay | Admitting: Cardiology

## 2012-05-30 ENCOUNTER — Emergency Department (HOSPITAL_COMMUNITY)
Admission: EM | Admit: 2012-05-30 | Discharge: 2012-05-31 | Disposition: A | Discharge status: Home / Self Care | Payer: BC Managed Care – PPO | Attending: Emergency Medicine | Admitting: Emergency Medicine

## 2012-05-30 DIAGNOSIS — R109 Unspecified abdominal pain: Secondary | ICD-10-CM

## 2012-05-30 DIAGNOSIS — Z79899 Other long term (current) drug therapy: Secondary | ICD-10-CM | POA: Insufficient documentation

## 2012-05-30 DIAGNOSIS — Z87448 Personal history of other diseases of urinary system: Secondary | ICD-10-CM | POA: Insufficient documentation

## 2012-05-30 DIAGNOSIS — R079 Chest pain, unspecified: Secondary | ICD-10-CM | POA: Insufficient documentation

## 2012-05-30 DIAGNOSIS — E876 Hypokalemia: Secondary | ICD-10-CM | POA: Insufficient documentation

## 2012-05-30 DIAGNOSIS — I10 Essential (primary) hypertension: Secondary | ICD-10-CM

## 2012-05-30 DIAGNOSIS — G4730 Sleep apnea, unspecified: Secondary | ICD-10-CM | POA: Insufficient documentation

## 2012-05-30 DIAGNOSIS — Z9981 Dependence on supplemental oxygen: Secondary | ICD-10-CM | POA: Insufficient documentation

## 2012-05-30 LAB — CBC WITH DIFFERENTIAL/PLATELET
HCT: 38.3 % — ABNORMAL LOW (ref 39.0–52.0)
Hemoglobin: 13.5 g/dL (ref 13.0–17.0)
Lymphocytes Relative: 27 % (ref 12–46)
MCHC: 35.2 g/dL (ref 30.0–36.0)
Monocytes Absolute: 0.4 10*3/uL (ref 0.1–1.0)
Monocytes Relative: 4 % (ref 3–12)
Neutro Abs: 6 10*3/uL (ref 1.7–7.7)
WBC: 9 10*3/uL (ref 4.0–10.5)

## 2012-05-30 LAB — COMPREHENSIVE METABOLIC PANEL
BUN: 25 mg/dL — ABNORMAL HIGH (ref 6–23)
CO2: 27 mEq/L (ref 19–32)
Chloride: 98 mEq/L (ref 96–112)
Creatinine, Ser: 2.21 mg/dL — ABNORMAL HIGH (ref 0.50–1.35)
GFR calc non Af Amer: 34 mL/min — ABNORMAL LOW (ref >90)
Glucose, Bld: 181 mg/dL — ABNORMAL HIGH (ref 70–99)
Total Bilirubin: 0.2 mg/dL — ABNORMAL LOW (ref 0.3–1.2)

## 2012-05-30 LAB — URINALYSIS, ROUTINE W REFLEX MICROSCOPIC
Glucose, UA: NEGATIVE mg/dL
Ketones, ur: NEGATIVE mg/dL
Leukocytes, UA: NEGATIVE
Nitrite: NEGATIVE
Protein, ur: 30 mg/dL — AB

## 2012-05-30 LAB — LIPASE, BLOOD: Lipase: 21 U/L (ref 11–59)

## 2012-05-30 LAB — URINE MICROSCOPIC-ADD ON

## 2012-05-30 MED ORDER — DIAZEPAM 5 MG/ML IJ SOLN
5.0000 mg | Freq: Once | INTRAMUSCULAR | Status: AC
  Administered 2012-05-30: 5 mg via INTRAVENOUS
  Filled 2012-05-30: qty 2

## 2012-05-30 MED ORDER — HYDROMORPHONE HCL PF 1 MG/ML IJ SOLN
1.0000 mg | Freq: Once | INTRAMUSCULAR | Status: AC
  Administered 2012-05-30: 1 mg via INTRAVENOUS
  Filled 2012-05-30: qty 1

## 2012-05-30 MED ORDER — HYDRALAZINE HCL 20 MG/ML IJ SOLN
20.0000 mg | Freq: Once | INTRAMUSCULAR | Status: AC
  Administered 2012-05-30: 20 mg via INTRAVENOUS
  Filled 2012-05-30: qty 1

## 2012-05-30 MED ORDER — CLONIDINE HCL 0.2 MG PO TABS
0.2000 mg | ORAL_TABLET | Freq: Once | ORAL | Status: AC
  Administered 2012-05-30: 0.2 mg via ORAL
  Filled 2012-05-30: qty 1

## 2012-05-30 MED ORDER — DIAZEPAM 5 MG PO TABS: 5.0000 mg | ORAL_TABLET | Freq: Three times a day (TID) | ORAL | Status: DC | PRN

## 2012-05-30 MED ORDER — PERCOCET 5-325 MG PO TABS: 1.0000 | ORAL_TABLET | Freq: Four times a day (QID) | ORAL | Status: DC | PRN

## 2012-05-30 NOTE — ED Notes (Signed)
Pt reports right flank pain for the past 3 days. Denies any urinary symptoms of hx of kidney stones. States his bp has been high, in the 200's at home. States he has been taking his bp medication regularly. Denies any vision changes but reports headache.

## 2012-05-30 NOTE — ED Provider Notes (Signed)
Patient with a history of renal disease, hypertension and sleep apnea presents emergency department complaining of right flank pain.  Care resumed from Dr. Darl Householder.  Thorough workup completed with labs and imaging reviewed.  Patient noticed to have chronic renal failure, mild hypokalemia replaced by previous provider, right flank pain with likely etiology of musculoskeletal spasms and poorly controlled hypertension.  Plan per previous provider is to manage patient's pain while in the emergency department and discharge with primary care followup.  Patient has been given 2 doses of Dilaudid and 2 doses of Valium with significant improvement of pain.  Blood pressure again increased to 208/130, however patient was not given his nightly dose of clonidine or hydralazine.  Patient is to be discharged and advised to take home medications.  0.2 clonidine given it while in the emergency department still.  On reexam prior to discharge patient is neurovascularly intact and in no acute distress.  At this time there does not appear to be any evidence of an acute emergency medical condition and the patient appears stable for discharge with appropriate outpatient follow up.Diagnosis was discussed with patient who verbalizes understanding and is agreeable to discharge. Pt case discussed with Dr. Winfred Leeds who agrees with my plan.    Verl Dicker, [place] 05/30/12 2253

## 2012-05-30 NOTE — ED Notes (Signed)
Dr. Ulice Dash at bedside.

## 2012-05-30 NOTE — ED Notes (Signed)
Pt c/o acute onset SOB. Pt sats 100/RA placed on 4L [place] for comfort. Lisette PA made aware

## 2012-05-30 NOTE — ED Provider Notes (Addendum)
History     CSN: [id number]  Arrival date & time 05/30/12  1819   First MD Initiated Contact with Patient 05/30/12 [place]      Chief Complaint  Patient presents with  . Hypertension  . Flank Pain    (Consider location/radiation/quality/duration/timing/severity/associated sxs/prior treatment) The history is provided by the patient.  Neeraj Vickerman is a 47 y.o. male hx of HTN, renal failure here with R flank pain, hypertension. Right flank pain for the last week or so. She noted that his BP has been over 200 the last 3 days. Intermittent chest pain that is a fluttery feeling. Some mild shortness of breath. No urinary symptoms but he had hematuria a month ago. No fever or chills.    Past Medical History  Diagnosis Date  . Hypertension   . Renal disorder   . Sleep apnea   . CPAP (continuous positive airway pressure) dependence     Past Surgical History  Procedure Laterality Date  . No past surgeries      Family History  Problem Relation Age of Onset  . Hypertension Mother     History  Substance Use Topics  . Smoking status: Never Smoker   . Smokeless tobacco: Never Used  . Alcohol Use: No      Review of Systems  Cardiovascular: Positive for chest pain.  Genitourinary: Positive for flank pain.  All other systems reviewed and are negative.    Allergies  Review of patient's allergies indicates no known allergies.  Home Medications   Current Outpatient Rx  Name  Route  Sig  Dispense  Refill  . amLODipine (NORVASC) 10 MG tablet   Oral   Take 10 mg by mouth daily.          . cloNIDine (CATAPRES) 0.2 MG tablet   Oral   Take 0.2 mg by mouth 4 (four) times daily.          . furosemide (LASIX) 80 MG tablet   Oral   Take 40 mg by mouth 2 (two) times daily.         . hydrALAZINE (APRESOLINE) 50 MG tablet   Oral   Take 50 mg by mouth 3 (three) times daily.         Marland Kitchen labetalol (NORMODYNE) 200 MG tablet   Oral   Take 200 mg by mouth 2 (two) times  daily.         . potassium chloride SA (K-DUR,KLOR-CON) 20 MEQ tablet   Oral   Take 20 mEq by mouth 4 (four) times daily.         Marland Kitchen spironolactone (ALDACTONE) 25 MG tablet   Oral   Take 25 mg by mouth daily.            BP 168/107  Pulse 86  Temp(Src) 98.2 F (36.8 C) (Oral)  Resp 14  SpO2 99%  Physical Exam  Nursing note and vitals reviewed. Constitutional: He is oriented to person, place, and time. He appears well-developed and well-nourished.  HENT:  Head: Normocephalic.  Mouth/Throat: Oropharynx is clear and moist.  Eyes: Conjunctivae are normal. Pupils are equal, round, and reactive to light.  Neck: Normal range of motion. Neck supple.  Cardiovascular: Normal rate, regular rhythm and normal heart sounds.   Pulmonary/Chest: Effort normal and breath sounds normal. No respiratory distress. He has no wheezes. He has no rales.  Abdominal: Soft.  Distended, slight tenderness R side, no R CVAT or RLQ tenderness. No murphy's.   Musculoskeletal: Normal range  of motion.  Neurological: He is alert and oriented to person, place, and time.  Skin: Skin is warm and dry.  Psychiatric: He has a normal mood and affect. His behavior is normal. Judgment and thought content normal.    ED Course  Procedures (including critical care time)  Labs Reviewed  CBC WITH DIFFERENTIAL - Abnormal; Notable for the following:    HCT 38.3 (*)    All other components within normal limits  COMPREHENSIVE METABOLIC PANEL - Abnormal; Notable for the following:    Potassium 3.4 (*)    Glucose, Bld 181 (*)    BUN 25 (*)    Creatinine, Ser 2.21 (*)    Total Bilirubin 0.2 (*)    GFR calc non Af Amer 34 (*)    GFR calc Af Amer 40 (*)    All other components within normal limits  PRO B NATRIURETIC PEPTIDE - Abnormal; Notable for the following:    Pro B Natriuretic peptide (BNP) 664.2 (*)    All other components within normal limits  LIPASE, BLOOD  TROPONIN I  URINALYSIS, ROUTINE W REFLEX  MICROSCOPIC   Ct Abdomen Pelvis Wo Contrast  05/30/2012  *RADIOLOGY REPORT*  Clinical Data: Right flank pain  CT ABDOMEN AND PELVIS WITHOUT CONTRAST  Technique:  Multidetector CT imaging of the abdomen and pelvis was performed following the standard protocol without intravenous contrast.  Comparison: 06/18/2011  Findings: Lung bases are unremarkable.  Sagittal images of the spine shows mild degenerative changes lumbar spine.  Unenhanced liver, spleen and pancreas is unremarkable.  Unenhanced kidneys are symmetrical in size.  No nephrolithiasis.  No hydronephrosis or hydroureter.  Small right adrenal nodule is stable from prior exam. Left adrenal is unremarkable.  No calcified gallstones are noted within gallbladder.  No aortic aneurysm.  No small bowel obstruction.  No pericecal inflammation.  Normal appendix is clearly visualized axial image 62.  No calcified ureteral calculi are noted.  Prostate gland seminal vesicles are unremarkable.  No calcified calculi are noted within urinary bladder.  Bilateral distal ureter is unremarkable.  Mild enlarged inguinal lymph nodes are noted.  Right inguinal lymph node measures 1.7 x 1.1 cm.  Left inguinal lymph node measures two by 1.2 cm.  Second right inguinal lymph node measures 1.8 x 1.2 cm.  IMPRESSION:  1.  No nephrolithiasis.  No hydronephrosis or hydroureter. 2.  No calcified ureteral calculi are noted. 3.  Normal appendix is clearly visualized. 4.  Borderline enlarged bilateral inguinal lymph nodes probable reactive in nature.   Original Report Authenticated By: Lahoma Crocker, M.D.    Dg Chest 2 View  05/30/2012  *RADIOLOGY REPORT*  Clinical Data: Chest pain, shortness of breath  CHEST - 2 VIEW  Comparison: 03/14/12  Findings: Cardiomediastinal silhouette is stable.  No acute infiltrate or pleural effusion.  No pulmonary edema.  Bony thorax is stable.  IMPRESSION: No active disease.  No significant change.   Original Report Authenticated By: Lahoma Crocker, M.D.       No diagnosis found.    MDM  Waddie Clavin is a 47 y.o. male here with hypertension and R flank pain and chest pain. Chest pain atypical but given HTN will get trop x 2. Will get BNP to r/o heart failure due to SOB. Will need to r/o AAA vs stone. Not concerned for dissection. Will give hydralazine and reassess.   8:17 PM BP down to 180s after hydralazine. CT ab/pel showed no stones or AAA. Trop neg x 1. Will need  3 hr troponin. I signed out to [place], [place].        Wandra Arthurs, MD 05/30/12 2018  Wandra Arthurs, MD 05/30/12 2019  Wandra Arthurs, MD 05/30/12 [phone]

## 2012-05-31 NOTE — ED Notes (Signed)
Departure Vitals were completed 05/30/2012 at 2340. [place], [place] made aware at 2345.

## 2012-05-31 NOTE — ED Notes (Signed)
Took pt out to car in wheelchair with family. While getting pt in car, pt vomited x 1. Gave pt emesis bag, ginger ale and crackers. Offered several times to family and pt to bring him back in for further evaluation and pt refused. Pt is alert and oriented and capable of making sound decisions for self. Family accepted that pt not decision to not come back into the ED. Family and Pt reminded to come back to ED if pt begins to vomit more, Pain increases or comes back, cardiac or respiratory issued.

## 2012-05-31 NOTE — ED Provider Notes (Signed)
Medical screening examination/treatment/procedure(s) were performed by non-physician practitioner and as supervising physician I was immediately available for consultation/collaboration.  Orlie Dakin, MD 05/31/12 [phone]

## 2012-05-31 NOTE — ED Notes (Signed)
Notified [place], [place] of pts blood pressure being 210/124. She stated that pt is still okay to go home. And he needed to take his home blood pressure medications (Hydralazine and Clonodine) when he got home. Made pt and family aware.

## 2013-06-26 ENCOUNTER — Encounter (HOSPITAL_COMMUNITY): Payer: Self-pay | Admitting: Emergency Medicine

## 2013-06-26 ENCOUNTER — Inpatient Hospital Stay (HOSPITAL_COMMUNITY)
Admission: EM | Admit: 2013-06-26 | Discharge: 2013-06-28 | DRG: 638 | Disposition: A | Discharge status: Home / Self Care | Payer: BC Managed Care – PPO | Attending: Internal Medicine | Admitting: Internal Medicine

## 2013-06-26 DIAGNOSIS — N183 Chronic kidney disease, stage 3 unspecified: Secondary | ICD-10-CM

## 2013-06-26 DIAGNOSIS — E1310 Other specified diabetes mellitus with ketoacidosis without coma: Principal | ICD-10-CM | POA: Diagnosis present

## 2013-06-26 DIAGNOSIS — Z79899 Other long term (current) drug therapy: Secondary | ICD-10-CM

## 2013-06-26 DIAGNOSIS — E669 Obesity, unspecified: Secondary | ICD-10-CM | POA: Diagnosis present

## 2013-06-26 DIAGNOSIS — E1110 Type 2 diabetes mellitus with ketoacidosis without coma: Secondary | ICD-10-CM | POA: Diagnosis present

## 2013-06-26 DIAGNOSIS — I120 Hypertensive chronic kidney disease with stage 5 chronic kidney disease or end stage renal disease: Secondary | ICD-10-CM | POA: Diagnosis present

## 2013-06-26 DIAGNOSIS — I10 Essential (primary) hypertension: Secondary | ICD-10-CM | POA: Diagnosis present

## 2013-06-26 DIAGNOSIS — Z794 Long term (current) use of insulin: Secondary | ICD-10-CM

## 2013-06-26 DIAGNOSIS — R7309 Other abnormal glucose: Secondary | ICD-10-CM

## 2013-06-26 DIAGNOSIS — N185 Chronic kidney disease, stage 5: Secondary | ICD-10-CM | POA: Diagnosis present

## 2013-06-26 DIAGNOSIS — Z23 Encounter for immunization: Secondary | ICD-10-CM

## 2013-06-26 DIAGNOSIS — Z8249 Family history of ischemic heart disease and other diseases of the circulatory system: Secondary | ICD-10-CM

## 2013-06-26 DIAGNOSIS — Z6841 Body Mass Index (BMI) 40.0 and over, adult: Secondary | ICD-10-CM

## 2013-06-26 DIAGNOSIS — E876 Hypokalemia: Secondary | ICD-10-CM | POA: Diagnosis present

## 2013-06-26 DIAGNOSIS — E871 Hypo-osmolality and hyponatremia: Secondary | ICD-10-CM | POA: Diagnosis present

## 2013-06-26 DIAGNOSIS — G4730 Sleep apnea, unspecified: Secondary | ICD-10-CM | POA: Diagnosis present

## 2013-06-26 DIAGNOSIS — R739 Hyperglycemia, unspecified: Secondary | ICD-10-CM | POA: Diagnosis present

## 2013-06-26 LAB — CBC
HEMATOCRIT: 39 % (ref 39.0–52.0)
HEMOGLOBIN: 13.8 g/dL (ref 13.0–17.0)
MCH: 29.8 pg (ref 26.0–34.0)
MCHC: 35.4 g/dL (ref 30.0–36.0)
MCV: 84.2 fL (ref 78.0–100.0)
Platelets: 245 10*3/uL (ref 150–400)
RBC: 4.63 MIL/uL (ref 4.22–5.81)
RDW: 13 % (ref 11.5–15.5)
WBC: 8.1 10*3/uL (ref 4.0–10.5)

## 2013-06-26 LAB — CBC WITH DIFFERENTIAL/PLATELET
BASOS ABS: 0 10*3/uL (ref 0.0–0.1)
Basophils Relative: 0 % (ref 0–1)
EOS ABS: 0.1 10*3/uL (ref 0.0–0.7)
EOS PCT: 1 % (ref 0–5)
HCT: 40.2 % (ref 39.0–52.0)
Hemoglobin: 14.3 g/dL (ref 13.0–17.0)
LYMPHS PCT: 25 % (ref 12–46)
Lymphs Abs: 1.8 10*3/uL (ref 0.7–4.0)
MCH: 30.4 pg (ref 26.0–34.0)
MCHC: 35.6 g/dL (ref 30.0–36.0)
MCV: 85.5 fL (ref 78.0–100.0)
Monocytes Absolute: 0.4 10*3/uL (ref 0.1–1.0)
Monocytes Relative: 5 % (ref 3–12)
NEUTROS PCT: 69 % (ref 43–77)
Neutro Abs: 5.2 10*3/uL (ref 1.7–7.7)
PLATELETS: 277 10*3/uL (ref 150–400)
RBC: 4.7 MIL/uL (ref 4.22–5.81)
RDW: 12.9 % (ref 11.5–15.5)
WBC: 7.5 10*3/uL (ref 4.0–10.5)

## 2013-06-26 LAB — COMPREHENSIVE METABOLIC PANEL
ALT: 15 U/L (ref 0–53)
AST: 14 U/L (ref 0–37)
Albumin: 3.3 g/dL — ABNORMAL LOW (ref 3.5–5.2)
Alkaline Phosphatase: 130 U/L — ABNORMAL HIGH (ref 39–117)
BUN: 23 mg/dL (ref 6–23)
CALCIUM: 9.5 mg/dL (ref 8.4–10.5)
CO2: 26 mEq/L (ref 19–32)
Chloride: 80 mEq/L — ABNORMAL LOW (ref 96–112)
Creatinine, Ser: 1.98 mg/dL — ABNORMAL HIGH (ref 0.50–1.35)
GFR calc Af Amer: 45 mL/min — ABNORMAL LOW (ref >90)
GFR calc non Af Amer: 39 mL/min — ABNORMAL LOW (ref >90)
Glucose, Bld: 947 mg/dL (ref 70–99)
POTASSIUM: 4.1 meq/L (ref 3.7–5.3)
SODIUM: 122 meq/L — AB (ref 137–147)
TOTAL PROTEIN: 7.5 g/dL (ref 6.0–8.3)
Total Bilirubin: 0.4 mg/dL (ref 0.3–1.2)

## 2013-06-26 LAB — URINALYSIS, ROUTINE W REFLEX MICROSCOPIC
Bilirubin Urine: NEGATIVE
HGB URINE DIPSTICK: NEGATIVE
Ketones, ur: NEGATIVE mg/dL
Leukocytes, UA: NEGATIVE
Nitrite: NEGATIVE
PH: 6 (ref 5.0–8.0)
Protein, ur: NEGATIVE mg/dL
SPECIFIC GRAVITY, URINE: 1.024 (ref 1.005–1.030)
Urobilinogen, UA: 0.2 mg/dL (ref 0.0–1.0)

## 2013-06-26 LAB — BASIC METABOLIC PANEL
BUN: 20 mg/dL (ref 6–23)
CALCIUM: 8.6 mg/dL (ref 8.4–10.5)
CHLORIDE: 92 meq/L — AB (ref 96–112)
CO2: 27 mEq/L (ref 19–32)
Creatinine, Ser: 1.83 mg/dL — ABNORMAL HIGH (ref 0.50–1.35)
GFR calc non Af Amer: 43 mL/min — ABNORMAL LOW (ref >90)
GFR, EST AFRICAN AMERICAN: 49 mL/min — AB (ref >90)
Glucose, Bld: 551 mg/dL (ref 70–99)
Potassium: 3.4 mEq/L — ABNORMAL LOW (ref 3.7–5.3)
SODIUM: 131 meq/L — AB (ref 137–147)

## 2013-06-26 LAB — CBG MONITORING, ED
GLUCOSE-CAPILLARY: 583 mg/dL — AB (ref 70–99)
Glucose-Capillary: >600 mg/dL (ref 70–99)
Glucose-Capillary: >600 mg/dL (ref 70–99)

## 2013-06-26 LAB — URINE MICROSCOPIC-ADD ON

## 2013-06-26 LAB — TROPONIN I: Troponin I: <0.3 ng/mL (ref <0.30)

## 2013-06-26 LAB — GLUCOSE, CAPILLARY: Glucose-Capillary: 474 mg/dL — ABNORMAL HIGH (ref 70–99)

## 2013-06-26 MED ORDER — DEXTROSE-NACL 5-0.45 % IV SOLN
INTRAVENOUS | Status: DC
  Administered 2013-06-27: 04:00:00 via INTRAVENOUS

## 2013-06-26 MED ORDER — POTASSIUM CHLORIDE 10 MEQ/100ML IV SOLN
10.0000 meq | INTRAVENOUS | Status: AC
  Administered 2013-06-26 – 2013-06-27 (×2): 10 meq via INTRAVENOUS
  Filled 2013-06-26 (×2): qty 100

## 2013-06-26 MED ORDER — POTASSIUM CHLORIDE IN NACL 20-0.9 MEQ/L-% IV SOLN
Freq: Once | INTRAVENOUS | Status: AC
  Administered 2013-06-26: 19:00:00 via INTRAVENOUS
  Filled 2013-06-26: qty 1000

## 2013-06-26 MED ORDER — INSULIN ASPART 100 UNIT/ML ~~LOC~~ SOLN
5.0000 [IU] | Freq: Once | SUBCUTANEOUS | Status: AC
  Administered 2013-06-26: 5 [IU] via SUBCUTANEOUS
  Filled 2013-06-26: qty 1

## 2013-06-26 MED ORDER — POTASSIUM CHLORIDE CRYS ER 20 MEQ PO TBCR
20.0000 meq | EXTENDED_RELEASE_TABLET | Freq: Two times a day (BID) | ORAL | Status: DC
  Administered 2013-06-27: 20 meq via ORAL
  Filled 2013-06-26 (×2): qty 1

## 2013-06-26 MED ORDER — SODIUM CHLORIDE 0.9 % IV SOLN: INTRAVENOUS | Status: DC

## 2013-06-26 MED ORDER — DEXTROSE 50 % IV SOLN: 25.0000 mL | INTRAVENOUS | Status: DC | PRN

## 2013-06-26 MED ORDER — SODIUM CHLORIDE 0.9 % IV BOLUS (SEPSIS)
1000.0000 mL | Freq: Once | INTRAVENOUS | Status: AC
  Administered 2013-06-26: 1000 mL via INTRAVENOUS

## 2013-06-26 MED ORDER — SODIUM CHLORIDE 0.9 % IV SOLN
INTRAVENOUS | Status: DC
  Administered 2013-06-26: 4.1 [IU]/h via INTRAVENOUS
  Administered 2013-06-27: 7.2 [IU]/h via INTRAVENOUS
  Administered 2013-06-27: 6.1 [IU]/h via INTRAVENOUS
  Administered 2013-06-27: 7 [IU]/h via INTRAVENOUS
  Filled 2013-06-26: qty 1

## 2013-06-26 MED ORDER — SPIRONOLACTONE 25 MG PO TABS
25.0000 mg | ORAL_TABLET | Freq: Every day | ORAL | Status: DC
  Administered 2013-06-27 – 2013-06-28 (×2): 25 mg via ORAL
  Filled 2013-06-26 (×2): qty 1

## 2013-06-26 MED ORDER — AMLODIPINE BESYLATE 5 MG PO TABS
5.0000 mg | ORAL_TABLET | Freq: Two times a day (BID) | ORAL | Status: DC
  Administered 2013-06-27 – 2013-06-28 (×3): 5 mg via ORAL
  Filled 2013-06-26 (×4): qty 1

## 2013-06-26 MED ORDER — CLONIDINE HCL 0.2 MG PO TABS
0.2000 mg | ORAL_TABLET | Freq: Four times a day (QID) | ORAL | Status: DC
  Administered 2013-06-27 – 2013-06-28 (×5): 0.2 mg via ORAL
  Filled 2013-06-26 (×8): qty 1

## 2013-06-26 MED ORDER — ENOXAPARIN SODIUM 30 MG/0.3ML ~~LOC~~ SOLN
30.0000 mg | SUBCUTANEOUS | Status: DC
  Administered 2013-06-27: 30 mg via SUBCUTANEOUS
  Filled 2013-06-26: qty 0.3

## 2013-06-26 MED ORDER — HYDRALAZINE HCL 25 MG PO TABS
25.0000 mg | ORAL_TABLET | Freq: Three times a day (TID) | ORAL | Status: DC
  Administered 2013-06-27: 25 mg via ORAL
  Filled 2013-06-26 (×3): qty 1

## 2013-06-26 MED ORDER — PNEUMOCOCCAL VAC POLYVALENT 25 MCG/0.5ML IJ INJ
0.5000 mL | INJECTION | INTRAMUSCULAR | Status: AC
  Administered 2013-06-27: 0.5 mL via INTRAMUSCULAR
  Filled 2013-06-26: qty 0.5

## 2013-06-26 MED ORDER — LABETALOL HCL 200 MG PO TABS
400.0000 mg | ORAL_TABLET | Freq: Two times a day (BID) | ORAL | Status: DC
  Administered 2013-06-27 – 2013-06-28 (×3): 400 mg via ORAL
  Filled 2013-06-26 (×4): qty 2

## 2013-06-26 MED ORDER — SODIUM CHLORIDE 0.9 % IV SOLN
INTRAVENOUS | Status: DC
  Administered 2013-06-27: via INTRAVENOUS

## 2013-06-26 NOTE — ED Notes (Signed)
Checked cbg reads Hi, RN Fields notified. Pt requested sandwich to eat, will inform RN Fields.

## 2013-06-26 NOTE — ED Notes (Signed)
The pt reports that he was having pain in his chest earlier today

## 2013-06-26 NOTE — ED Notes (Signed)
The pt  Ate 2 hours ago

## 2013-06-26 NOTE — ED Provider Notes (Signed)
ACSN: [id number]     Arrival date & time 06/26/13  1625 History   First MD Initiated Contact with Patient 06/26/13 1745     Chief Complaint  Patient presents with  . Hyperglycemia   HPI 48 year old male with a history of hypertension presents with hyperglycemia. He presented earlier today to his primary care physician's office for routine visit and screen. He was called back when labs demonstrated that his blood glucose was greater than 700. He was advised to proceed to the emergency department for evaluation. He does complain of polydipsia and polyuria. He denies any fever, chills, chest pain, URI symptoms, dysuria, rash, or other infectious symptoms. He has never been diagnosed with diabetes.   Past Medical History  Diagnosis Date  . Hypertension   . Renal disorder   . Sleep apnea   . CPAP (continuous positive airway pressure) dependence    Past Surgical History  Procedure Laterality Date  . No past surgeries     Family History  Problem Relation Age of Onset  . Hypertension Mother    History  Substance Use Topics  . Smoking status: Never Smoker   . Smokeless tobacco: Never Used  . Alcohol Use: No    Review of Systems  Constitutional: Negative for fever and chills.  HENT: Negative for congestion and rhinorrhea.   Eyes: Negative for visual disturbance.  Respiratory: Negative for cough and shortness of breath.   Cardiovascular: Negative for chest pain and leg swelling.  Gastrointestinal: Negative for nausea, vomiting, abdominal pain and diarrhea.  Endocrine: Positive for polydipsia, polyphagia and polyuria.  Genitourinary: Negative for dysuria, urgency, frequency, flank pain and difficulty urinating.  Musculoskeletal: Negative for back pain, neck pain and neck stiffness.  Skin: Negative for rash.  Neurological: Negative for syncope, weakness, numbness and headaches.  All other systems reviewed and are negative.      Allergies  Review of patient's allergies indicates  no known allergies.  Home Medications   No current outpatient prescriptions on file. BP 157/109  Pulse 68  Temp(Src) 98.1 F (36.7 C) (Oral)  Resp 18  Ht 5' 6.5" (1.689 m)  Wt 257 lb 3.2 oz (116.665 kg)  BMI 40.90 kg/m2  SpO2 96% Physical Exam  Nursing note and vitals reviewed. Constitutional: He is oriented to person, place, and time. He appears well-developed and well-nourished. No distress.  HENT:  Head: Normocephalic and atraumatic.  Dry mucus membranes  Eyes: Conjunctivae and EOM are normal. Pupils are equal, round, and reactive to light.  Neck: Normal range of motion. Neck supple.  Cardiovascular: Normal rate, regular rhythm, normal heart sounds and intact distal pulses.  Exam reveals no gallop and no friction rub.   No murmur heard. Pulmonary/Chest: Effort normal. No respiratory distress. He has no wheezes. He has no rales.  Abdominal: Soft. Bowel sounds are normal. He exhibits no distension and no mass. There is no tenderness. There is no rebound and no guarding.  Musculoskeletal: He exhibits no tenderness.  Neurological: He is alert and oriented to person, place, and time. No cranial nerve deficit. He exhibits normal muscle tone. Coordination normal.  Skin: Skin is warm and dry.  Psychiatric: He has a normal mood and affect.    ED Course  Procedures (including critical care time) Labs Review Labs Reviewed  COMPREHENSIVE METABOLIC PANEL - Abnormal; Notable for the following:    Sodium 122 (*)    Chloride 80 (*)    Glucose, Bld 947 (*)    Creatinine, Ser 1.98 (*)  Albumin 3.3 (*)    Alkaline Phosphatase 130 (*)    GFR calc non Af Amer 39 (*)    GFR calc Af Amer 45 (*)    All other components within normal limits  URINALYSIS, ROUTINE W REFLEX MICROSCOPIC - Abnormal; Notable for the following:    Glucose, UA >1000 (*)    All other components within normal limits  CBC WITH DIFFERENTIAL   Imaging Review No results found.   EKG Interpretation None       MDM   49 year old male presents with hyperglycemia. Blood glucose found on metabolic panel performed by PCP today to be 700. He does have polydipsia and polyuria but no other symptoms. No chest pain or ischemic equivalent symptoms. He has no prior history of diabetes.  In the emergency department, afebrile, normal vital signs. Nontoxic-appearing. Dry mucous membranes. Cardiopulmonary exam and abdominal exam are normal.  No pedal demonstrates a glucose of 947. Pseudohyponatremia secondary to hyperglycemia. Chloride 80. Normal bicarbonate. Normal CBC. No ketones in his urine.  He is not acidotic, negative ketones, doubt DKA. He is given 2 L IV fluid bolus. He was started on normal saline with 20 KCl at 200 an hour. He was initially given 5 units of simultaneous insulin. He was started on insulin infusion.  Admitted to [facility] for further management.  Final diagnoses:  CKD (chronic kidney disease) stage 3, GFR 30-59 ml/min  Essential hypertension, malignant  HTN (hypertension)  Hyperglycemia  Hypokalemia  DM 2     Wendall Papa, MD 06/27/13 1355

## 2013-06-26 NOTE — ED Notes (Signed)
MD Resident Pippin at bedside.

## 2013-06-26 NOTE — ED Notes (Signed)
The pt had a check up today  And had blood and urine  At his doctors office.  The pt has had excessive thirst frequent urination for over a week.  He was called at home and was told that his glucose was over 700 and that he needed to come here for treatment.

## 2013-06-26 NOTE — ED Notes (Signed)
Pt reports he was seen at his PCP today for a follow up appointment, they collected blood work and urine, his PCP called him informing him to come to ED for further evaluation of an elevated blood sugar of 700. Pt does report blurred vision, increase thirst, increase urination x1 week. Pt also reports intermittent episodes of Left side chest pain that has subsided, SOB that worsens with exertion

## 2013-06-26 NOTE — ED Notes (Signed)
CBG 583

## 2013-06-26 NOTE — Progress Notes (Signed)
Attempted to call for report. Secretary told me RN would call back.

## 2013-06-27 DIAGNOSIS — E871 Hypo-osmolality and hyponatremia: Secondary | ICD-10-CM | POA: Diagnosis present

## 2013-06-27 DIAGNOSIS — I10 Essential (primary) hypertension: Secondary | ICD-10-CM | POA: Insufficient documentation

## 2013-06-27 LAB — BASIC METABOLIC PANEL
BUN: 19 mg/dL (ref 6–23)
BUN: 17 mg/dL (ref 6–23)
BUN: 15 mg/dL (ref 6–23)
BUN: 18 mg/dL (ref 6–23)
CHLORIDE: 97 meq/L (ref 96–112)
CHLORIDE: 101 meq/L (ref 96–112)
CO2: 27 mEq/L (ref 19–32)
CO2: 26 mEq/L (ref 19–32)
CO2: 20 meq/L (ref 19–32)
CO2: 24 meq/L (ref 19–32)
CREATININE: 1.67 mg/dL — AB (ref 0.50–1.35)
Calcium: 8.8 mg/dL (ref 8.4–10.5)
Calcium: 8.9 mg/dL (ref 8.4–10.5)
Calcium: 9.2 mg/dL (ref 8.4–10.5)
Calcium: 8.9 mg/dL (ref 8.4–10.5)
Chloride: 95 mEq/L — ABNORMAL LOW (ref 96–112)
Chloride: 99 mEq/L (ref 96–112)
Creatinine, Ser: 1.81 mg/dL — ABNORMAL HIGH (ref 0.50–1.35)
Creatinine, Ser: 1.66 mg/dL — ABNORMAL HIGH (ref 0.50–1.35)
Creatinine, Ser: 1.71 mg/dL — ABNORMAL HIGH (ref 0.50–1.35)
GFR calc Af Amer: 50 mL/min — ABNORMAL LOW (ref >90)
GFR calc Af Amer: 55 mL/min — ABNORMAL LOW (ref >90)
GFR calc Af Amer: 54 mL/min — ABNORMAL LOW (ref >90)
GFR calc non Af Amer: 48 mL/min — ABNORMAL LOW (ref >90)
GFR, EST AFRICAN AMERICAN: 56 mL/min — AB (ref >90)
GFR, EST NON AFRICAN AMERICAN: 46 mL/min — AB (ref >90)
GFR, EST NON AFRICAN AMERICAN: 43 mL/min — AB (ref >90)
GFR, EST NON AFRICAN AMERICAN: 48 mL/min — AB (ref >90)
GLUCOSE: 431 mg/dL — AB (ref 70–99)
Glucose, Bld: 304 mg/dL — ABNORMAL HIGH (ref 70–99)
Glucose, Bld: 122 mg/dL — ABNORMAL HIGH (ref 70–99)
Glucose, Bld: 274 mg/dL — ABNORMAL HIGH (ref 70–99)
POTASSIUM: 3.7 meq/L (ref 3.7–5.3)
POTASSIUM: 3 meq/L — AB (ref 3.7–5.3)
POTASSIUM: 3.2 meq/L — AB (ref 3.7–5.3)
POTASSIUM: 3.4 meq/L — AB (ref 3.7–5.3)
SODIUM: 134 meq/L — AB (ref 137–147)
SODIUM: 137 meq/L (ref 137–147)
Sodium: 140 mEq/L (ref 137–147)
Sodium: 134 mEq/L — ABNORMAL LOW (ref 137–147)

## 2013-06-27 LAB — GLUCOSE, CAPILLARY
GLUCOSE-CAPILLARY: 136 mg/dL — AB (ref 70–99)
GLUCOSE-CAPILLARY: 240 mg/dL — AB (ref 70–99)
GLUCOSE-CAPILLARY: 247 mg/dL — AB (ref 70–99)
GLUCOSE-CAPILLARY: 276 mg/dL — AB (ref 70–99)
GLUCOSE-CAPILLARY: 291 mg/dL — AB (ref 70–99)
GLUCOSE-CAPILLARY: 388 mg/dL — AB (ref 70–99)
Glucose-Capillary: 365 mg/dL — ABNORMAL HIGH (ref 70–99)
Glucose-Capillary: 293 mg/dL — ABNORMAL HIGH (ref 70–99)
Glucose-Capillary: 146 mg/dL — ABNORMAL HIGH (ref 70–99)
Glucose-Capillary: 105 mg/dL — ABNORMAL HIGH (ref 70–99)
Glucose-Capillary: 109 mg/dL — ABNORMAL HIGH (ref 70–99)
Glucose-Capillary: 153 mg/dL — ABNORMAL HIGH (ref 70–99)
Glucose-Capillary: 176 mg/dL — ABNORMAL HIGH (ref 70–99)
Glucose-Capillary: 283 mg/dL — ABNORMAL HIGH (ref 70–99)

## 2013-06-27 LAB — MRSA PCR SCREENING: MRSA by PCR: NEGATIVE

## 2013-06-27 MED ORDER — POTASSIUM CHLORIDE CRYS ER 20 MEQ PO TBCR
20.0000 meq | EXTENDED_RELEASE_TABLET | Freq: Two times a day (BID) | ORAL | Status: DC
  Administered 2013-06-27 – 2013-06-28 (×2): 20 meq via ORAL
  Filled 2013-06-27 (×2): qty 1

## 2013-06-27 MED ORDER — INSULIN ASPART 100 UNIT/ML ~~LOC~~ SOLN
0.0000 [IU] | Freq: Every day | SUBCUTANEOUS | Status: DC
  Administered 2013-06-27: 3 [IU] via SUBCUTANEOUS

## 2013-06-27 MED ORDER — INSULIN ASPART 100 UNIT/ML ~~LOC~~ SOLN
0.0000 [IU] | Freq: Three times a day (TID) | SUBCUTANEOUS | Status: DC
  Administered 2013-06-27: 11 [IU] via SUBCUTANEOUS
  Administered 2013-06-28: 7 [IU] via SUBCUTANEOUS

## 2013-06-27 MED ORDER — INSULIN ASPART 100 UNIT/ML ~~LOC~~ SOLN
4.0000 [IU] | Freq: Three times a day (TID) | SUBCUTANEOUS | Status: DC
  Administered 2013-06-27 – 2013-06-28 (×2): 4 [IU] via SUBCUTANEOUS

## 2013-06-27 MED ORDER — BD GETTING STARTED TAKE HOME KIT: 3/10ML X 30G SYRINGES
1.0000 | Freq: Once | Status: AC
  Administered 2013-06-27: 1
  Filled 2013-06-27: qty 1

## 2013-06-27 MED ORDER — HYDRALAZINE HCL 50 MG PO TABS
50.0000 mg | ORAL_TABLET | Freq: Three times a day (TID) | ORAL | Status: DC
  Filled 2013-06-27 (×2): qty 1

## 2013-06-27 MED ORDER — ENOXAPARIN SODIUM 40 MG/0.4ML ~~LOC~~ SOLN
40.0000 mg | SUBCUTANEOUS | Status: DC
  Filled 2013-06-27: qty 0.4

## 2013-06-27 MED ORDER — HYDRALAZINE HCL 50 MG PO TABS
50.0000 mg | ORAL_TABLET | Freq: Three times a day (TID) | ORAL | Status: DC
  Administered 2013-06-27 – 2013-06-28 (×3): 50 mg via ORAL
  Filled 2013-06-27 (×6): qty 1

## 2013-06-27 MED ORDER — LIVING WELL WITH DIABETES BOOK
Freq: Once | Status: AC
  Administered 2013-06-27: 11:00:00
  Filled 2013-06-27: qty 1

## 2013-06-27 MED ORDER — INSULIN ASPART 100 UNIT/ML ~~LOC~~ SOLN
0.0000 [IU] | Freq: Three times a day (TID) | SUBCUTANEOUS | Status: DC
  Administered 2013-06-27: 8 [IU] via SUBCUTANEOUS

## 2013-06-27 MED ORDER — POTASSIUM CHLORIDE CRYS ER 20 MEQ PO TBCR: 20.0000 meq | EXTENDED_RELEASE_TABLET | Freq: Two times a day (BID) | ORAL | Status: DC

## 2013-06-27 MED ORDER — POTASSIUM CHLORIDE 10 MEQ/100ML IV SOLN
10.0000 meq | INTRAVENOUS | Status: AC
  Administered 2013-06-27 (×3): 10 meq via INTRAVENOUS
  Filled 2013-06-27 (×4): qty 100

## 2013-06-27 MED ORDER — INSULIN GLARGINE 100 UNIT/ML ~~LOC~~ SOLN
35.0000 [IU] | Freq: Every day | SUBCUTANEOUS | Status: DC
  Administered 2013-06-28: 35 [IU] via SUBCUTANEOUS
  Filled 2013-06-27: qty 0.35

## 2013-06-27 MED ORDER — INSULIN GLARGINE 100 UNIT/ML ~~LOC~~ SOLN
20.0000 [IU] | Freq: Every day | SUBCUTANEOUS | Status: DC
  Administered 2013-06-27: 20 [IU] via SUBCUTANEOUS
  Filled 2013-06-27 (×2): qty 0.2

## 2013-06-27 NOTE — Plan of Care (Signed)
Problem: Food- and Nutrition-Related Knowledge Deficit (NB-1.1) Goal: Nutrition education Formal process to instruct or train a patient/client in a skill or to impart knowledge to help patients/clients voluntarily manage or modify food choices and eating behavior to maintain or improve health. Outcome: Completed/Met Date Met:  06/27/13  RD consulted for nutrition education regarding diabetes.     No results found for this basename: HGBA1C    RD provided "Carbohydrate Counting for People with Diabetes" handout from the Academy of [employer] and Dietetics. Discussed different food groups and their effects on blood sugar, emphasizing carbohydrate-containing foods. Provided list of carbohydrates and recommended serving sizes of common foods.  Discussed importance of controlled and consistent carbohydrate intake throughout the day. Provided examples of ways to balance meals/snacks and encouraged intake of high-fiber, whole grain complex carbohydrates. Teach back method used.  Expect fair compliance. Will order OP diabetes diet education for reinforcement.  Body mass index is 40.9 kg/(m^2). Pt meets criteria for morbid obesity based on current BMI.  Current diet order is CHO-modified, patient is consuming approximately 100% of meals at this time. Labs and medications reviewed. No further nutrition interventions warranted at this time. RD contact information provided. If additional nutrition issues arise, please re-consult RD.  Molli Barrows, RD, LDN, [place] Pager [phone] After Hours Pager [phone]

## 2013-06-27 NOTE — Progress Notes (Signed)
Spoke with patient on the phone.  States that his mother had diabetes, but not sure which Type.  Suggested that he watch DM videos while in hospital and nurses would be working with him on insulin administration and how to check his blood sugars.  States that he has [employer]. Recommended that he go to outpatient diabetes classes when discharged. Will continue to follow.  Harvel Ricks RN BSN CDE

## 2013-06-27 NOTE — Progress Notes (Signed)
Patient Demographics  [name], is a 48 y.o. male, DOB - 09-Jan-1966, [id number]  Admit date - 06/26/2013   Admitting Physician Theressa Millard, MD  Outpatient Primary MD for the patient is Reymundo Poll, MD  LOS - 1   Chief Complaint  Patient presents with  . Hyperglycemia        Assessment & Plan    1. DKA- New Diabetes, Placed on the DKA Protocol with IV Insulin drip to be transitioned , Almost closed, give him Lantus, transitioned to sliding scale insulin, stop IV fluids and IV insulin drip. Diabetic educator consulted. We'll likely go home on insulin regimen.  CBG (last 3)   Recent Labs  06/27/13 0833 06/27/13 0942 06/27/13 1055  GLUCAP 153* 176* 247*     2. Hyponatremia- Due to #1, approved with IV fluids and care for DKA.    3. Hypokalemia- Repleted and recheck in the evening.    4. HTN- spoken to, on multiple high-dose blood pressure medications at home which will be continued, increase hydralazine for better control and monitor.   5. CKD 5 - Moniotr BUN/Cr Trend. Slight creatinine around 1.8, at or better than baseline. Monitor.       Code Status: Full  Family Communication:    Disposition Plan: Home   Procedures     Consults      Medications  Scheduled Meds: . amLODipine  5 mg Oral BID  . bd getting started take home kit  1 kit Other Once  . cloNIDine  0.2 mg Oral QID  . enoxaparin (LOVENOX) injection  30 mg Subcutaneous Q24H  . hydrALAZINE  25 mg Oral TID  . insulin glargine  20 Units Subcutaneous Daily  . labetalol  400 mg Oral BID  . living well with diabetes book   Does not apply Once  . potassium chloride  10 mEq Intravenous Q1 Hr x 3  . potassium chloride SA  20 mEq Oral BID  . spironolactone  25 mg Oral Daily   Continuous  Infusions: . dextrose 5 % and 0.45% NaCl 100 mL/hr at 06/27/13 0339  . insulin (NOVOLIN-R) infusion 1 Units/hr (06/27/13 0629)   PRN Meds:.dextrose  DVT Prophylaxis  Lovenox    Lab Results  Component Value Date   PLT 245 06/26/2013    Antibiotics     Anti-infectives   None          Subjective:   [name] today has, No headache, No chest pain, No abdominal pain - No Nausea, No new weakness tingling or numbness, No Cough - SOB.   Objective:   Filed Vitals:   06/26/13 2232 06/27/13 0011 06/27/13 0421 06/27/13 0837  BP: 153/101 143/88 148/98 157/109  Pulse: 74 74 74 68  Temp: 98.1 F (36.7 C) 97.8 F (36.6 C) 97.6 F (36.4 C) 98.1 F (36.7 C)  TempSrc: Axillary Oral Oral Oral  Resp: _0 Height:  5' 6.5" (1.689 m)    Weight:  116.665 kg (257 lb 3.2 oz)    SpO2: 96% 97% 97% 96%    Wt Readings from Last 3 Encounters:  06/27/13 116.665 kg (257 lb 3.2 oz)  03/31/11 116.1 kg (255 lb 15.3 oz)  Intake/Output Summary (Last 24 hours) at 06/27/13 1101 Last data filed at 06/27/13 0853  Gross per 24 hour  Intake   1700 ml  Output   2225 ml  Net   -525 ml     Physical Exam  Awake Alert, Oriented X 3, No new F.N deficits, Normal affect [place].AT,PERRAL Supple Neck,No JVD, No cervical lymphadenopathy appriciated.  Symmetrical Chest wall movement, Good air movement bilaterally, CTAB RRR,No Gallops,Rubs or new Murmurs, No Parasternal Heave +ve B.Sounds, Abd Soft, Non tender, No organomegaly appriciated, No rebound - guarding or rigidity. No Cyanosis, Clubbing or edema, No new Rash or bruise      Data Review   Micro Results Recent Results (from the past 240 hour(s))  MRSA PCR SCREENING     Status: None   Collection Time    06/26/13 10:45 PM      Result Value Ref Range Status   MRSA by PCR NEGATIVE  NEGATIVE Final   Comment:            The GeneXpert MRSA Assay (FDA     approved for NASAL specimens     only), is one component of a      comprehensive MRSA colonization     surveillance program. It is not     intended to diagnose MRSA     infection nor to guide or     monitor treatment for     MRSA infections.    Radiology Reports No results found.  CBC  Recent Labs Lab 06/26/13 1648 06/26/13 2245  WBC 7.5 8.1  HGB 14.3 13.8  HCT 40.2 39.0  PLT 277 245  MCV 85.5 84.2  MCH 30.4 29.8  MCHC 35.6 35.4  RDW 12.9 13.0  LYMPHSABS 1.8  --   MONOABS 0.4  --   EOSABS 0.1  --   BASOSABS 0.0  --     Chemistries   Recent Labs Lab 06/26/13 1648 06/26/13 2245 06/27/13 0050 06/27/13 0246 06/27/13 0503  NA 122* 131* 134* 137 140  K 4.1 3.4* 3.4* 3.2* 3.0*  CL 80* 92* 95* 97 101  CO2 _0 GLUCOSE 947* 551* 431* 304* 122*  BUN _1 CREATININE 1.98* 1.83* 1.81* 1.66* 1.67*  CALCIUM 9.5 8.6 8.8 8.9 9.2  AST 14  --   --   --   --   ALT 15  --   --   --   --   ALKPHOS 130*  --   --   --   --   BILITOT 0.4  --   --   --   --    ------------------------------------------------------------------------------------------------------------------ estimated creatinine clearance is 67 ml/min (by C-G formula based on Cr of 1.67). ------------------------------------------------------------------------------------------------------------------ No results found for this basename: HGBA1C,  in the last 72 hours ------------------------------------------------------------------------------------------------------------------ No results found for this basename: CHOL, HDL, LDLCALC, TRIG, CHOLHDL, LDLDIRECT,  in the last 72 hours ------------------------------------------------------------------------------------------------------------------ No results found for this basename: TSH, T4TOTAL, FREET3, T3FREE, THYROIDAB,  in the last 72 hours ------------------------------------------------------------------------------------------------------------------ No results found for this basename: VITAMINB12, FOLATE,  FERRITIN, TIBC, IRON, RETICCTPCT,  in the last 72 hours  Coagulation profile No results found for this basename: INR, PROTIME,  in the last 168 hours  No results found for this basename: DDIMER,  in the last 72 hours  Cardiac Enzymes  Recent Labs Lab 06/26/13 2245  TROPONINI <0.30   ------------------------------------------------------------------------------------------------------------------ No components found with this basename: POCBNP,  Time Spent in minutes  35   Elie Leppo K M.D on 06/27/2013 at 11:01 AM  Between 7am to 7pm - Pager - [phone]  After 7pm go to [email or web address] - password [email or web address]  And look for the night coverage person covering for me after hours  [facility] Office  [phone]

## 2013-06-27 NOTE — H&P (Signed)
[facility] History and Physical  Roberth Larmore KF:[id number] DOB: 1966-03-09 DOA: 06/26/2013  Referring physician: EDP PCP: Reymundo Poll, MD  Specialists:   Chief Complaint:   Critically High Blood Sugar  HPI: [name] is a 48 y.o. male with a history of HTN  who saw his PCP for a routine check today and had blood work done. When the labs returned he was advised to go to the Ed because his Glucose was critically high.   He went to the ED, and he reports that he had been having blurry vision,  polyuria, polydipsia and polyphagia for about 1 week.   When he was evaluated in the ED ,he was found to have a Glucose level of 947.   He was given 5 units of Regular Insulin SQ and referred for medical admission.       Review of Systems:  Constitutional: No Weight Loss, No Weight Gain, Night Sweats, Fevers, Chills, Fatigue, or Generalized Weakness HEENT: No Headaches, Difficulty Swallowing,Tooth/Dental Problems,Sore Throat,  No Sneezing, Rhinitis, Ear Ache, Nasal Congestion, or Post Nasal Drip,  Cardio-vascular:  No Chest pain, Orthopnea, PND, Edema in lower extremities, Anasarca, Dizziness, Palpitations  Resp: No Dyspnea, No DOE, No Productive Cough, No Non-Productive Cough, No Hemoptysis, No Change in Color of Mucus,  No Wheezing.    GI: No Heartburn, Indigestion, Abdominal Pain, Nausea, Vomiting, Diarrhea, Change in Bowel Habits,  Loss of Appetite  GU: No Dysuria, Change in Color of Urine, No Urgency or +Urinary Frequency.  No flank pain.  Musculoskeletal: No Joint Pain or Swelling.  No Decreased Range of Motion. No Back Pain.  Neurologic: No Syncope, No Seizures, Muscle Weakness, Paresthesia, Vision Disturbance or Loss, No Diplopia, No Vertigo, No Difficulty Walking,  Skin: No Rash or Lesions. Psych: No Change in Mood or Affect. No Depression or Anxiety. No Memory loss. No Confusion or Hallucinations   Past Medical History  Diagnosis Date  . Hypertension   . Renal disorder   .  Sleep apnea   . CPAP (continuous positive airway pressure) dependence       Past Surgical History  Procedure Laterality Date  . No past surgeries         Prior to Admission medications   Medication Sig Start Date End Date Taking? Authorizing Provider  amLODipine (NORVASC) 5 MG tablet Take 5 mg by mouth 2 (two) times daily.   Yes Historical Provider, MD  cloNIDine (CATAPRES) 0.2 MG tablet Take 0.2 mg by mouth 4 (four) times daily.    Yes Historical Provider, MD  furosemide (LASIX) 80 MG tablet Take 120 mg by mouth daily.    Yes Historical Provider, MD  hydrALAZINE (APRESOLINE) 50 MG tablet Take 25 mg by mouth 3 (three) times daily.    Yes Historical Provider, MD  labetalol (NORMODYNE) 200 MG tablet Take 400 mg by mouth 2 (two) times daily.    Yes Historical Provider, MD  potassium chloride SA (K-DUR,KLOR-CON) 20 MEQ tablet Take 20 mEq by mouth 2 (two) times daily.    Yes Historical Provider, MD  spironolactone (ALDACTONE) 25 MG tablet Take 25 mg by mouth daily.    Yes Historical Provider, MD      No Known Allergies   Social History:  reports that he has never smoked. He has never used smokeless tobacco. He reports that he does not drink alcohol or use illicit drugs.     Family History  Problem Relation Age of Onset  . Hypertension Mother  Physical Exam:  GEN:  Pleasant Obese 48 y.o.   African American  male  examined  and in no acute distress; cooperative with exam Filed Vitals:   06/26/13 2130 06/26/13 2232 06/27/13 0011 06/27/13 0421  BP: 126/81 153/101 143/88 148/98  Pulse: 73 74 74 74  Temp:  98.1 F (36.7 C) 97.8 F (36.6 C) 97.6 F (36.4 C)  TempSrc:  Axillary Oral Oral  Resp:  19 18 18   Height:   5' 6.5" (1.689 m)   Weight:   116.665 kg (257 lb 3.2 oz)   SpO2: 94% 96% 97% 97%   Blood pressure 148/98, pulse 74, temperature 97.6 F (36.4 C), temperature source Oral, resp. rate 18, height 5' 6.5" (1.689 m), weight 116.665 kg (257 lb 3.2 oz), SpO2  97.00%. PSYCH: He is alert and oriented x4; does not appear anxious does not appear depressed; affect is normal HEENT: Normocephalic and Atraumatic, Mucous membranes pink; PERRLA; EOM intact; Fundi:  Benign;  No scleral icterus, Nares: Patent, Oropharynx: Clear, Fair Dentition, Neck:  FROM, no cervical lymphadenopathy nor thyromegaly or carotid bruit; no JVD; Breasts:: Not examined CHEST WALL: No tenderness CHEST: Normal respiration, clear to auscultation bilaterally HEART: Regular rate and rhythm; no murmurs rubs or gallops BACK: No kyphosis or scoliosis; no CVA tenderness ABDOMEN: Positive Bowel Sounds,  Obese, soft non-tender; no masses, no organomegaly. Rectal Exam: Not done EXTREMITIES: No cyanosis, clubbing or edema; no ulcerations. Genitalia: not examined PULSES: 2+ and symmetric SKIN: Normal hydration no rash or ulceration CNS:  Alert and Oriented X 4, No Focal Deficits.    Vascular: pulses palpable throughout    Labs on Admission:  Basic Metabolic Panel:  Recent Labs Lab 06/26/13 1648 06/26/13 2245 06/27/13 0050 06/27/13 0246  NA 122* 131* 134* 137  K 4.1 3.4* 3.4* 3.2*  CL 80* 92* 95* 97  CO2 26 27 27 26   GLUCOSE 947* 551* 431* 304*  BUN 23 20 19 18   CREATININE 1.98* 1.83* 1.81* 1.66*  CALCIUM 9.5 8.6 8.8 8.9   Liver Function Tests:  Recent Labs Lab 06/26/13 1648  AST 14  ALT 15  ALKPHOS 130*  BILITOT 0.4  PROT 7.5  ALBUMIN 3.3*   No results found for this basename: LIPASE, AMYLASE,  in the last 168 hours No results found for this basename: AMMONIA,  in the last 168 hours CBC:  Recent Labs Lab 06/26/13 1648 06/26/13 2245  WBC 7.5 8.1  NEUTROABS 5.2  --   HGB 14.3 13.8  HCT 40.2 39.0  MCV 85.5 84.2  PLT 277 245   Cardiac Enzymes:  Recent Labs Lab 06/26/13 2245  TROPONINI <0.30    BNP (last 3 results) No results found for this basename: PROBNP,  in the last 8760 hours CBG:  Recent Labs Lab 06/27/13 0124 06/27/13 0222 06/27/13 0326  06/27/13 0422 06/27/13 0531  GLUCAP 365* 293* 240* 146* 105*    Radiological Exams on Admission: No results found.     Assessment/Plan:   48 y.o. male with  Principal Problem:   DKA (diabetic ketoacidoses) Active Problems:   Hyperglycemia   Hyponatremia   Hypokalemia   HTN (hypertension)   CKD (chronic kidney disease) stage 3, GFR 30-59 ml/min    1.   DKA- New Diabetes, Placed on the DKA Protocol with IV Insulin drip to be transitioned to SSI coverage and initiation of Lantus Insulin.   Monitor Electrolytes and BUN/cr.    2.   Hyponatremia-  Due to #1,  Monitor with  fluid administration and IV Insulin admin.     3.   Hypokalemia-   Replete  4.   HTN-   IV Hydralazine while NPO, resume PO Anti-Hypertensives when taking PO.    5.   CKD- Moniotr BUN/Cr Trend.    6.   DVT prophylaxis with Lovenox.       Code Status:  FULL CODE Family Communication:  No Family present Disposition Plan:    Inpatient  Time spent:   76 mInutes  [facility] Pager [phone]  If 7PM-7AM, please contact night-coverage [email or web address] Password [facility] 06/27/2013, 6:02 AM

## 2013-06-27 NOTE — Progress Notes (Signed)
Pt admitted to unit from ED. Pt is A&O, VS stable & skin intact. Pt is oriented to unit, safety video viewed, and call bell within reach. Pt currently resting comfortably in bed. Will continue to monitor.

## 2013-06-27 NOTE — Progress Notes (Signed)
Pt stated he felt dizzy & "nervous". BS 105. On call physician aware. Will recheck BS @ 0630 & continue to monitor.

## 2013-06-27 NOTE — Progress Notes (Signed)
Inpatient Diabetes Program Recommendations  AACE/ADA: New Consensus Statement on Inpatient Glycemic Control (2013)  Target Ranges:  Prepandial:   less than 140 mg/dL      Peak postprandial:   less than 180 mg/dL (1-2 hours)      Critically ill patients:  140 - 180 mg/dL   Reason for Visit:Diabetes Coordinator consult  Diabetes history: none Outpatient Diabetes medications: none Current orders for Inpatient glycemic control:Lantus 20 units daily, has been on IV insulin   Note: Patient admitted with hyperglycemia.  Had been having polyuria and polydipsia at home.  Admitting blood sugar was 947 mg/dl. Started on IV insulin glucostabilizer. Has now been given Lantus 20 units before being transitioned off stabilizer.  Recommend Novolog MODERATE correction scale AC & HS as patient transitions off IV insulin and then continue when eating.  May need to increase Lantus to 25 units daily if CBGs continue greater than 180 mg/dl. Spoke with staff RN and she will be working with patient on insulin administration and DM education.  Patient will need to attend outpatient DM education after discharge. Will continue to follow while in hospital.  Harvel Ricks RN BSN CDE

## 2013-06-28 LAB — HEMOGLOBIN A1C
Hgb A1c MFr Bld: 12.3 % — ABNORMAL HIGH (ref <5.7)
MEAN PLASMA GLUCOSE: 306 mg/dL — AB (ref <117)

## 2013-06-28 LAB — BASIC METABOLIC PANEL
BUN: 15 mg/dL (ref 6–23)
CHLORIDE: 98 meq/L (ref 96–112)
CO2: 22 meq/L (ref 19–32)
Calcium: 9.2 mg/dL (ref 8.4–10.5)
Creatinine, Ser: 1.77 mg/dL — ABNORMAL HIGH (ref 0.50–1.35)
GFR calc non Af Amer: 44 mL/min — ABNORMAL LOW (ref >90)
GFR, EST AFRICAN AMERICAN: 51 mL/min — AB (ref >90)
Glucose, Bld: 255 mg/dL — ABNORMAL HIGH (ref 70–99)
POTASSIUM: 3.6 meq/L — AB (ref 3.7–5.3)
SODIUM: 135 meq/L — AB (ref 137–147)

## 2013-06-28 LAB — GLUCOSE, CAPILLARY: GLUCOSE-CAPILLARY: 226 mg/dL — AB (ref 70–99)

## 2013-06-28 MED ORDER — HYDRALAZINE HCL 50 MG PO TABS: 50.0000 mg | ORAL_TABLET | Freq: Three times a day (TID) | ORAL | Status: DC

## 2013-06-28 MED ORDER — INSULIN ASPART 100 UNIT/ML ~~LOC~~ SOLN: SUBCUTANEOUS | Status: DC

## 2013-06-28 MED ORDER — INSULIN GLARGINE 100 UNIT/ML ~~LOC~~ SOLN: 35.0000 [IU] | Freq: Every day | SUBCUTANEOUS | Status: DC

## 2013-06-28 MED ORDER — FREESTYLE SYSTEM KIT: 1.0000 | PACK | Freq: Three times a day (TID) | Status: DC

## 2013-06-28 NOTE — Progress Notes (Signed)
Nsg Discharge Note  Admit Date:  06/26/2013 Discharge date: 06/28/2013   Mliss Fritz Segreto to be D/C'd Home per MD order.  AVS completed.  Copy for chart, and copy for patient signed, and dated. Patient/caregiver able to verbalize understanding.  Discharge Medication:   Medication List         amLODipine 5 MG tablet  Commonly known as:  NORVASC  Take 5 mg by mouth 2 (two) times daily.     cloNIDine 0.2 MG tablet  Commonly known as:  CATAPRES  Take 0.2 mg by mouth 4 (four) times daily.     furosemide 80 MG tablet  Commonly known as:  LASIX  Take 120 mg by mouth daily.     glucose monitoring kit monitoring kit  1 each by Does not apply route 4 (four) times daily - after meals and at bedtime. 1 month Diabetic Testing Supplies for QAC-QHS accuchecks.Any brand OK     hydrALAZINE 50 MG tablet  Commonly known as:  APRESOLINE  Take 1 tablet (50 mg total) by mouth 3 (three) times daily.     insulin aspart 100 UNIT/ML injection  Commonly known as:  NOVOLOG  - Before each meal 3 times a day, 140-199 - 2 units, 200-250 - 6 units, 251-299 - 8 units,  300-349 - 10 units,  350 or above 12 units.  - Dispense syringes and needles as needed, Ok to switch to PEN if approved.     insulin glargine 100 UNIT/ML injection  Commonly known as:  LANTUS  Inject 0.35 mLs (35 Units total) into the skin daily. dispense pen if approved, if not please dispense as needed syringes and needles - 1 month supply     labetalol 200 MG tablet  Commonly known as:  NORMODYNE  Take 400 mg by mouth 2 (two) times daily.     potassium chloride SA 20 MEQ tablet  Commonly known as:  K-DUR,KLOR-CON  Take 20 mEq by mouth 2 (two) times daily.     spironolactone 25 MG tablet  Commonly known as:  ALDACTONE  Take 25 mg by mouth daily.        Discharge Assessment: Filed Vitals:   06/28/13 0512  BP: 135/88  Pulse: 66  Temp: 97.8 F (36.6 C)  Resp: 18   Skin clean, dry and intact without evidence of skin break  down, no evidence of skin tears noted. IV catheter discontinued intact. Site without signs and symptoms of complications - no redness or edema noted at insertion site, patient denies c/o pain - only slight tenderness at site.  Dressing with slight pressure applied.  D/c Instructions-Education: Discharge instructions given to patient/family with verbalized understanding. D/c education completed with patient/family including follow up instructions, medication list, d/c activities limitations if indicated, with other d/c instructions as indicated by MD - patient able to verbalize understanding, all questions fully answered. Patient instructed to return to ED, call 911, or call MD for any changes in condition.  Patient escorted via [place], and D/C home via private auto.  Dayle Points, RN 06/28/2013 10:20 AM

## 2013-06-28 NOTE — ED Provider Notes (Signed)
Medical screening examination/treatment/procedure(s) were conducted as a shared visit with resident-physician practitioner(s) and myself.  I personally evaluated the patient during the encounter.  Pt is a 48 y.o. male with pmhx as above presenting with new onset diabetes w/o DKA.  Pt found to have glucose of 947. VSS, pt in NAD, dry mucus membranes. Pt given IVF, 5U insuling, then started on gluco-stabilizer.  [facility] will admit.    Neta Ehlers, MD 06/28/13 1154

## 2013-06-28 NOTE — Discharge Summary (Signed)
[name], is a 48 y.o. male  DOB [birth year]  MRN [id number].  Admission date:  06/26/2013  Admitting Physician  Theressa Millard, MD  Discharge Date:  06/28/2013   Primary MD  Reymundo Poll, MD  Recommendations for primary care physician for things to follow:   For her blood pressure and CBGs adjust insulin as needed   Admission Diagnosis  DKA diabetic ketoacidoses [id number]   Discharge Diagnosis  DKA diabetic ketoacidoses [id number] new diagnosis of diabetes mellitus type 2  Principal Problem:   DKA (diabetic ketoacidoses) Active Problems:   Hypokalemia   HTN (hypertension)   CKD (chronic kidney disease) stage 3, GFR 30-59 ml/min   Hyperglycemia   Hyponatremia      Past Medical History  Diagnosis Date  . Hypertension   . Renal disorder   . Sleep apnea   . CPAP (continuous positive airway pressure) dependence     Past Surgical History  Procedure Laterality Date  . No past surgeries       Discharge Condition: Stable   Follow UP  Follow-up Information   Follow up with Reymundo Poll, MD. Schedule an appointment as soon as possible for a visit in 3 days.   Specialty:  Family Medicine   Contact information:   [address] [place] [address] [phone]         Discharge Instructions  and  Discharge Medications      Discharge Orders   Future Orders Complete By Expires   Ambulatory referral to Nutrition and Diabetic Education  As directed    Comments:     New to insulin. Will need follow up on regimen and meal planning.   Discharge instructions  As directed    Comments:     Follow with Primary MD Reymundo Poll, MD in 3 days   Get CBC, CMP, checked 3 days by Primary MD and again as instructed by your Primary MD.    Accuchecks 4 times/day, Once in AM empty stomach and then before  each meal. Log in all results and show them to your Prim.MD in 3 days. If any glucose reading is under 80 or above 300 call your Prim MD immidiately. Follow Low glucose instructions for glucose under 80 as instructed.   Activity: As tolerated with Full fall precautions use walker/cane & assistance as needed   Disposition Home    Diet: Heart Healthy - Low Carb  For Heart failure patients - Check your Weight same time everyday, if you gain over 2 pounds, or you develop in leg swelling, experience more shortness of breath or chest pain, call your Primary MD immediately. Follow Cardiac Low Salt Diet and 1.8 lit/day fluid restriction.   On your next visit with her primary care physician please Get Medicines reviewed and adjusted.  Please request your Prim.MD to go over all Hospital Tests and Procedure/Radiological results at the follow up, please get all Hospital records sent to your Prim MD by signing hospital release before you go home.  If you experience worsening of your admission symptoms, develop shortness of breath, life threatening emergency, suicidal or homicidal thoughts you must seek medical attention immediately by calling 911 or calling your MD immediately  if symptoms less severe.  You Must read complete instructions/literature along with all the possible adverse reactions/side effects for all the Medicines you take and that have been prescribed to you. Take any new Medicines after you have completely understood and accpet all the possible adverse reactions/side effects.   Do not drive and provide baby sitting services if your were admitted for syncope or siezures until you have seen by Primary MD or a Neurologist and advised to do so again.  Do not drive when taking Pain medications.    Do not take more than prescribed Pain, Sleep and Anxiety Medications  Special Instructions: If you have smoked or chewed Tobacco  in the last 2 yrs please stop smoking, stop any regular  Alcohol  and or any Recreational drug use.  Wear Seat belts while driving.   Please note  You were cared for by a hospitalist during your hospital stay. If you have any questions about your discharge medications or the care you received while you were in the hospital after you are discharged, you can call the unit and asked to speak with the hospitalist on call if the hospitalist that took care of you is not available. Once you are discharged, your primary care physician will handle any further medical issues. Please note that NO REFILLS for any discharge medications will be authorized once you are discharged, as it is imperative that you return to your primary care physician (or establish a relationship with a primary care physician if you do not have one) for your aftercare needs so that they can reassess your need for medications and monitor your lab values.   Increase activity slowly  As directed        Medication List         amLODipine 5 MG tablet  Commonly known as:  NORVASC  Take 5 mg by mouth 2 (two) times daily.     cloNIDine 0.2 MG tablet  Commonly known as:  CATAPRES  Take 0.2 mg by mouth 4 (four) times daily.     furosemide 80 MG tablet  Commonly known as:  LASIX  Take 120 mg by mouth daily.     glucose monitoring kit monitoring kit  1 each by Does not apply route 4 (four) times daily - after meals and at bedtime. 1 month Diabetic Testing Supplies for QAC-QHS accuchecks.Any brand OK     hydrALAZINE 50 MG tablet  Commonly known as:  APRESOLINE  Take 1 tablet (50 mg total) by mouth 3 (three) times daily.     insulin aspart 100 UNIT/ML injection  Commonly known as:  NOVOLOG  - Before each meal 3 times a day, 140-199 - 2 units, 200-250 - 6 units, 251-299 - 8 units,  300-349 - 10 units,  350 or above 12 units.  - Dispense syringes and needles as needed, Ok to switch to PEN if approved.     insulin glargine 100 UNIT/ML injection  Commonly known as:  LANTUS  Inject  0.35 mLs (35 Units total) into the skin daily. dispense pen if approved, if not please dispense as needed syringes and needles - 1 month supply     labetalol 200 MG tablet  Commonly known as:  NORMODYNE  Take 400 mg by mouth 2 (two) times daily.  potassium chloride SA 20 MEQ tablet  Commonly known as:  K-DUR,KLOR-CON  Take 20 mEq by mouth 2 (two) times daily.     spironolactone 25 MG tablet  Commonly known as:  ALDACTONE  Take 25 mg by mouth daily.          Diet and Activity recommendation: See Discharge Instructions above   Consults obtained - diabetic educator   Major procedures and Radiology Reports - PLEASE review detailed and final reports for all details, in brief -       No results found.  Micro Results     Recent Results (from the past 240 hour(s))  MRSA PCR SCREENING     Status: None   Collection Time    06/26/13 10:45 PM      Result Value Ref Range Status   MRSA by PCR NEGATIVE  NEGATIVE Final   Comment:            The GeneXpert MRSA Assay (FDA     approved for NASAL specimens     only), is one component of a     comprehensive MRSA colonization     surveillance program. It is not     intended to diagnose MRSA     infection nor to guide or     monitor treatment for     MRSA infections.     History of present illness and  Hospital Course:     Kindly see H&P for history of present illness and admission details, please review complete Labs, Consult reports and Test reports for all details in brief Jjesus Hegler, is a 48 y.o. male, patient with history of hypertension, who was found to have extremely high sugars during routine blood check at his PCPs office, came to the ER where workup was consistent with DKA and new diagnosis of type 2 diabetes mellitus.   DKA in a newly diagnosed type 2 diabetes mellitus patient. Was treated with IV insulin along with IV fluids, and Close yesterday, transitioned to Lantus along with sliding scale, received  education on Accu-Cheks and insulin by a diabetic educator. Will be placed on Lantus and sliding-scale and discharged home, will be provided testing supplies. Written instructions on Accu-Cheks and to followup with PCP in 3 days given. Mild hyponatremia and hypokalemia cause due to DKA almost completely resolved. Repeat BMP in 3 days per PCP.     Hypertension. Was in poor control, he was already on multiple medications, blood pressure much improved after hydralazine dose increased. Will be provided higher dose hydralazine with continuation of home medications.    Chronic kidney disease stage V. Baseline creatinine 1.8 he is at or better than baseline after hydration.       Today   Subjective:   Jaterrius Ledin today has no headache,no chest abdominal pain,no new weakness tingling or numbness, feels much better wants to go home today.    Objective:   Blood pressure 135/88, pulse 66, temperature 97.8 F (36.6 C), temperature source Oral, resp. rate 18, height 5' 6.5" (1.689 m), weight 116.665 kg (257 lb 3.2 oz), SpO2 99.00%.   Intake/Output Summary (Last 24 hours) at 06/28/13 0808 Last data filed at 06/27/13 1600  Gross per 24 hour  Intake    240 ml  Output    200 ml  Net     40 ml    Exam Awake Alert, Oriented *3, No new F.N deficits, Normal affect [place].AT,PERRAL Supple Neck,No JVD, No cervical lymphadenopathy appriciated.  Symmetrical Chest wall  movement, Good air movement bilaterally, CTAB RRR,No Gallops,Rubs or new Murmurs, No Parasternal Heave +ve B.Sounds, Abd Soft, Non tender, No organomegaly appriciated, No rebound -guarding or rigidity. No Cyanosis, Clubbing or edema, No new Rash or bruise  Data Review   CBC w Diff: Lab Results  Component Value Date   WBC 8.1 06/26/2013   HGB 13.8 06/26/2013   HCT 39.0 06/26/2013   PLT 245 06/26/2013   LYMPHOPCT 25 06/26/2013   MONOPCT 5 06/26/2013   EOSPCT 1 06/26/2013   BASOPCT 0 06/26/2013    CMP: Lab Results  Component Value  Date   NA 134* 06/27/2013   K 3.7 06/27/2013   CL 99 06/27/2013   CO2 20 06/27/2013   BUN 15 06/27/2013   CREATININE 1.71* 06/27/2013   PROT 7.5 06/26/2013   ALBUMIN 3.3* 06/26/2013   BILITOT 0.4 06/26/2013   ALKPHOS 130* 06/26/2013   AST 14 06/26/2013   ALT 15 06/26/2013  .   Total Time in preparing paper work, data evaluation and todays exam - 35 minutes  Thurnell Lose M.D on 06/28/2013 at 8:08 AM  [facility] Office  [phone]

## 2013-06-28 NOTE — Progress Notes (Signed)
[name] was admitted to the Hospital on 06/26/2013 and Discharged  06/28/2013 and should be excused from work/school   for 3 days starting 06/26/2013 , may return to work/school without any restrictions.  Call Lala Lund MD, [facility] [phone] with questions.  Thurnell Lose M.D on 06/28/2013,at 9:42 AM  [facility] Office  [phone]

## 2013-06-28 NOTE — Discharge Instructions (Signed)
Follow with Primary MD Reymundo Poll, MD in 3 days   Get CBC, CMP, checked 3 days by Primary MD and again as instructed by your Primary MD.    Accuchecks 4 times/day, Once in AM empty stomach and then before each meal. Log in all results and show them to your Prim.MD in 3 days. If any glucose reading is under 80 or above 300 call your Prim MD immidiately. Follow Low glucose instructions for glucose under 80 as instructed.   Activity: As tolerated with Full fall precautions use walker/cane & assistance as needed   Disposition Home    Diet: Heart Healthy - Low Carb  For Heart failure patients - Check your Weight same time everyday, if you gain over 2 pounds, or you develop in leg swelling, experience more shortness of breath or chest pain, call your Primary MD immediately. Follow Cardiac Low Salt Diet and 1.8 lit/day fluid restriction.   On your next visit with her primary care physician please Get Medicines reviewed and adjusted.  Please request your Prim.MD to go over all Hospital Tests and Procedure/Radiological results at the follow up, please get all Hospital records sent to your Prim MD by signing hospital release before you go home.   If you experience worsening of your admission symptoms, develop shortness of breath, life threatening emergency, suicidal or homicidal thoughts you must seek medical attention immediately by calling 911 or calling your MD immediately  if symptoms less severe.  You Must read complete instructions/literature along with all the possible adverse reactions/side effects for all the Medicines you take and that have been prescribed to you. Take any new Medicines after you have completely understood and accpet all the possible adverse reactions/side effects.   Do not drive and provide baby sitting services if your were admitted for syncope or siezures until you have seen by Primary MD or a Neurologist and advised to do so again.  Do not drive when taking Pain  medications.    Do not take more than prescribed Pain, Sleep and Anxiety Medications  Special Instructions: If you have smoked or chewed Tobacco  in the last 2 yrs please stop smoking, stop any regular Alcohol  and or any Recreational drug use.  Wear Seat belts while driving.   Please note  You were cared for by a hospitalist during your hospital stay. If you have any questions about your discharge medications or the care you received while you were in the hospital after you are discharged, you can call the unit and asked to speak with the hospitalist on call if the hospitalist that took care of you is not available. Once you are discharged, your primary care physician will handle any further medical issues. Please note that NO REFILLS for any discharge medications will be authorized once you are discharged, as it is imperative that you return to your primary care physician (or establish a relationship with a primary care physician if you do not have one) for your aftercare needs so that they can reassess your need for medications and monitor your lab values.

## 2013-08-13 ENCOUNTER — Encounter: Payer: BC Managed Care – PPO | Attending: Geriatric Medicine | Admitting: *Deleted

## 2013-08-13 ENCOUNTER — Encounter: Payer: Self-pay | Admitting: *Deleted

## 2013-08-13 VITALS — Ht 66.5 in | Wt 261.2 lb

## 2013-08-13 DIAGNOSIS — Z713 Dietary counseling and surveillance: Secondary | ICD-10-CM | POA: Insufficient documentation

## 2013-08-13 DIAGNOSIS — E1110 Type 2 diabetes mellitus with ketoacidosis without coma: Secondary | ICD-10-CM

## 2013-08-13 DIAGNOSIS — E119 Type 2 diabetes mellitus without complications: Secondary | ICD-10-CM | POA: Insufficient documentation

## 2013-08-13 NOTE — Progress Notes (Signed)
Appt start time: 1400 end time:  1530.  Assessment:  Patient was seen on  08/13/2013 for individual diabetes education. Lives with wife, he shops for the food, they share the cooking. He is not working right now. He walks and tries to ride a bike outside and goes to the gym 4 - 5 days a week where he walks around the track, and rides stationary bike, occasional house work. There are 2 children, 57 and 47 years old. He bought a meter and has his mother's too. He checks his BG in AM, before exercise, before lunch, dinner and bedtime. He reports current range of 60 to 150 mg/dl.   Current HbA1c: 12.3% on 06/27/13  Preferred Learning Style:   Hands on  Learning Readiness:   Ready  Change in progress  MEDICATIONS: see list, diabetes medications are Lantus @ 35 units at night and Novolog @ sliding scale if BG is above 140 mg/dl which he hasn't needed since out of the hospital.  DIETARY INTAKE:  24-hr recall:  B ( AM): cereal with 2% milk OR bacon, eggs and waffle or grits, water or fruit juice Snk ( AM): no  L ( PM): skips if he forgets- sandwich, fresh fruit, water Snk ( PM): not unless fresh fruit D ( PM): lean meat, starch, salad, green vegetable, water or flavored water Snk ( PM): occasionally bowl of cereal with milk OR canned fruit Beverages: water, fruit juice, flavored water  Usual physical activity: gym 4-5 days a week and he walks  Estimated energy needs: 1600 calories 180 g carbohydrates 120 g protein 44 g fat  Progress Towards Goal(s):  In progress.   Nutritional Diagnosis:  NB-1.1 Food and nutrition-related knowledge deficit As related to diabetes.  As evidenced by A1c of 12.3%.    Intervention:  Nutrition counseling provided.  Discussed diabetes disease process and treatment options.  Discussed physiology of diabetes and role of obesity on insulin resistance.  Encouraged moderate weight reduction to improve glucose levels.  Discussed role of medications and diet in  glucose control  Provided education on macronutrients on glucose levels.  Provided education on carb counting, importance of regularly scheduled meals/snacks, and meal planning  Discussed effects of physical activity on glucose levels and long-term glucose control.  Recommended continued 150 minutes of physical activity/week.  Reviewed patient medications including his insulin doses.  Discussed role of medication on blood glucose and possible side effects  Discussed blood glucose monitoring and interpretation.  Discussed recommended target ranges and individual ranges.    Described short-term complications: hyper- and hypo-glycemia.  Discussed causes,symptoms, and treatment options.  Discussed prevention, detection, and treatment of long-term complications.  Discussed the role of prolonged elevated glucose levels on body systems.  Discussed role of stress on blood glucose levels and discussed strategies to manage psychosocial issues.  Discussed recommendations for long-term diabetes self-care.  Provided checklist for medical, dental, and emotional self-care.  Plan:  Aim for 3 Carb Choices per meal (45 grams) +/- 1 either way  Aim for 0-1 Carbs per snack if hungry  Include protein in moderation with your meals and snacks Consider reading food labels for Total Carbohydrate of foods Continue with your activity level by going to gym and walking daily as tolerated Continue checking BG at alternate times per day as directed by MD   Teaching Method Utilized: Visual, Auditory and Hands on  Handouts given during visit include: Living Well with Diabetes Carb Counting and Food Label handouts Meal Plan Card  Barriers  to learning/adherence to lifestyle change: none  Diabetes self-care support plan:   [facility] support group  Demonstrated degree of understanding via:  Teach Back   Monitoring/Evaluation:  Dietary intake, exercise, reading food labels, and body weight in 6 week(s).

## 2013-08-13 NOTE — Patient Instructions (Signed)
Plan:  Aim for 3 Carb Choices per meal (45 grams) +/- 1 either way  Aim for 0-1 Carbs per snack if hungry  Include protein in moderation with your meals and snacks Consider reading food labels for Total Carbohydrate of foods Continue with your activity level by going to gym and walking daily as tolerated Continue checking BG at alternate times per day as directed by MD

## 2013-10-06 ENCOUNTER — Ambulatory Visit: Payer: BC Managed Care – PPO | Admitting: *Deleted

## 2014-03-26 ENCOUNTER — Emergency Department (INDEPENDENT_AMBULATORY_CARE_PROVIDER_SITE_OTHER)
Admission: EM | Admit: 2014-03-26 | Discharge: 2014-03-26 | Disposition: A | Discharge status: Home / Self Care | Payer: Self-pay | Source: Home / Self Care | Attending: Family Medicine | Admitting: Family Medicine

## 2014-03-26 ENCOUNTER — Emergency Department (HOSPITAL_COMMUNITY)
Admission: EM | Admit: 2014-03-26 | Discharge: 2014-03-26 | Disposition: A | Discharge status: Home / Self Care | Payer: BC Managed Care – PPO | Attending: Emergency Medicine | Admitting: Emergency Medicine

## 2014-03-26 ENCOUNTER — Encounter (HOSPITAL_COMMUNITY): Payer: Self-pay | Admitting: *Deleted

## 2014-03-26 ENCOUNTER — Encounter (HOSPITAL_COMMUNITY): Payer: Self-pay

## 2014-03-26 DIAGNOSIS — E119 Type 2 diabetes mellitus without complications: Secondary | ICD-10-CM

## 2014-03-26 DIAGNOSIS — E0822 Diabetes mellitus due to underlying condition with diabetic chronic kidney disease: Secondary | ICD-10-CM

## 2014-03-26 DIAGNOSIS — G4730 Sleep apnea, unspecified: Secondary | ICD-10-CM | POA: Insufficient documentation

## 2014-03-26 DIAGNOSIS — Z79899 Other long term (current) drug therapy: Secondary | ICD-10-CM | POA: Insufficient documentation

## 2014-03-26 DIAGNOSIS — Z9981 Dependence on supplemental oxygen: Secondary | ICD-10-CM | POA: Insufficient documentation

## 2014-03-26 DIAGNOSIS — Z794 Long term (current) use of insulin: Secondary | ICD-10-CM | POA: Insufficient documentation

## 2014-03-26 DIAGNOSIS — Z87448 Personal history of other diseases of urinary system: Secondary | ICD-10-CM | POA: Insufficient documentation

## 2014-03-26 DIAGNOSIS — I10 Essential (primary) hypertension: Secondary | ICD-10-CM | POA: Insufficient documentation

## 2014-03-26 DIAGNOSIS — R739 Hyperglycemia, unspecified: Secondary | ICD-10-CM

## 2014-03-26 DIAGNOSIS — E1165 Type 2 diabetes mellitus with hyperglycemia: Secondary | ICD-10-CM | POA: Insufficient documentation

## 2014-03-26 DIAGNOSIS — IMO0001 Reserved for inherently not codable concepts without codable children: Secondary | ICD-10-CM

## 2014-03-26 LAB — COMPREHENSIVE METABOLIC PANEL
ALK PHOS: 104 U/L (ref 39–117)
ALT: 14 U/L (ref 0–53)
AST: 13 U/L (ref 0–37)
Albumin: 3.4 g/dL — ABNORMAL LOW (ref 3.5–5.2)
Anion gap: 14 (ref 5–15)
BUN: 23 mg/dL (ref 6–23)
CHLORIDE: 94 meq/L — AB (ref 96–112)
CO2: 27 mEq/L (ref 19–32)
Calcium: 9.8 mg/dL (ref 8.4–10.5)
Creatinine, Ser: 1.99 mg/dL — ABNORMAL HIGH (ref 0.50–1.35)
GFR calc non Af Amer: 38 mL/min — ABNORMAL LOW (ref >90)
GFR, EST AFRICAN AMERICAN: 44 mL/min — AB (ref >90)
GLUCOSE: 342 mg/dL — AB (ref 70–99)
POTASSIUM: 3.5 meq/L — AB (ref 3.7–5.3)
SODIUM: 135 meq/L — AB (ref 137–147)
Total Bilirubin: 0.3 mg/dL (ref 0.3–1.2)
Total Protein: 7.6 g/dL (ref 6.0–8.3)

## 2014-03-26 LAB — URINE MICROSCOPIC-ADD ON

## 2014-03-26 LAB — POCT I-STAT, CHEM 8
BUN: 27 mg/dL — ABNORMAL HIGH (ref 6–23)
CHLORIDE: 96 meq/L (ref 96–112)
Calcium, Ion: 1.21 mmol/L (ref 1.12–1.23)
Creatinine, Ser: 2.1 mg/dL — ABNORMAL HIGH (ref 0.50–1.35)
GLUCOSE: 385 mg/dL — AB (ref 70–99)
HEMATOCRIT: 45 % (ref 39.0–52.0)
HEMOGLOBIN: 15.3 g/dL (ref 13.0–17.0)
Potassium: 3.4 mEq/L — ABNORMAL LOW (ref 3.7–5.3)
SODIUM: 137 meq/L (ref 137–147)
TCO2: 27 mmol/L (ref 0–100)

## 2014-03-26 LAB — CBG MONITORING, ED
GLUCOSE-CAPILLARY: 434 mg/dL — AB (ref 70–99)
Glucose-Capillary: 361 mg/dL — ABNORMAL HIGH (ref 70–99)
Glucose-Capillary: 389 mg/dL — ABNORMAL HIGH (ref 70–99)
Glucose-Capillary: 292 mg/dL — ABNORMAL HIGH (ref 70–99)

## 2014-03-26 LAB — URINALYSIS, ROUTINE W REFLEX MICROSCOPIC
Bilirubin Urine: NEGATIVE
Glucose, UA: >1000 mg/dL — AB
HGB URINE DIPSTICK: NEGATIVE
Ketones, ur: NEGATIVE mg/dL
Leukocytes, UA: NEGATIVE
Nitrite: NEGATIVE
PH: 5 (ref 5.0–8.0)
Protein, ur: >300 mg/dL — AB
Specific Gravity, Urine: 1.03 (ref 1.005–1.030)
UROBILINOGEN UA: 0.2 mg/dL (ref 0.0–1.0)

## 2014-03-26 LAB — CBC
HEMATOCRIT: 40 % (ref 39.0–52.0)
HEMOGLOBIN: 13.6 g/dL (ref 13.0–17.0)
MCH: 29.2 pg (ref 26.0–34.0)
MCHC: 34 g/dL (ref 30.0–36.0)
MCV: 86 fL (ref 78.0–100.0)
Platelets: 286 10*3/uL (ref 150–400)
RBC: 4.65 MIL/uL (ref 4.22–5.81)
RDW: 13.4 % (ref 11.5–15.5)
WBC: 7.5 10*3/uL (ref 4.0–10.5)

## 2014-03-26 MED ORDER — SODIUM CHLORIDE 0.9 % IV BOLUS (SEPSIS)
1000.0000 mL | Freq: Once | INTRAVENOUS | Status: AC
  Administered 2014-03-26: 1000 mL via INTRAVENOUS

## 2014-03-26 MED ORDER — INSULIN ASPART 100 UNIT/ML ~~LOC~~ SOLN
10.0000 [IU] | Freq: Once | SUBCUTANEOUS | Status: AC
  Administered 2014-03-26: 10 [IU] via SUBCUTANEOUS
  Filled 2014-03-26: qty 1

## 2014-03-26 MED ORDER — POTASSIUM CHLORIDE CRYS ER 20 MEQ PO TBCR
40.0000 meq | EXTENDED_RELEASE_TABLET | Freq: Once | ORAL | Status: AC
  Administered 2014-03-26: 40 meq via ORAL
  Filled 2014-03-26: qty 2

## 2014-03-26 NOTE — ED Notes (Signed)
Pt sent here from [facility], reports fatigue, weakness, frequent urination, nausea and excessive thirst x 1 week, glucose 385 at [facility].

## 2014-03-26 NOTE — ED Provider Notes (Signed)
CSN: [id number]     Arrival date & time 03/26/14  1249 History   First MD Initiated Contact with Patient 03/26/14 1314     Chief Complaint  Patient presents with  . Fatigue   (Consider location/radiation/quality/duration/timing/severity/associated sxs/prior Treatment) Patient is a 48 y.o. male presenting with shortness of breath.  Shortness of Breath Severity:  Moderate Onset quality:  Gradual Progression:  Worsening Chronicity:  New Associated symptoms: no abdominal pain, no chest pain and no fever   Associated symptoms comment:  Swelling of ext Risk factors comment:  Hbp, dm and kidney disease.   Past Medical History  Diagnosis Date  . Hypertension   . Renal disorder   . Sleep apnea   . CPAP (continuous positive airway pressure) dependence   . Diabetes mellitus without complication    Past Surgical History  Procedure Laterality Date  . No past surgeries     Family History  Problem Relation Age of Onset  . Hypertension Mother    History  Substance Use Topics  . Smoking status: Never Smoker   . Smokeless tobacco: Never Used  . Alcohol Use: No    Review of Systems  Constitutional: Negative for fever.  Respiratory: Positive for shortness of breath.   Cardiovascular: Positive for leg swelling. Negative for chest pain.  Gastrointestinal: Negative for abdominal pain.  Neurological: Positive for weakness.    Allergies  Review of patient's allergies indicates no known allergies.  Home Medications   Prior to Admission medications   Medication Sig Start Date End Date Taking? Authorizing Provider  amLODipine (NORVASC) 5 MG tablet Take 5 mg by mouth 2 (two) times daily.    Historical Provider, MD  cloNIDine (CATAPRES) 0.2 MG tablet Take 0.2 mg by mouth 4 (four) times daily.     Historical Provider, MD  furosemide (LASIX) 80 MG tablet Take 120 mg by mouth daily.     Historical Provider, MD  glucose monitoring kit (FREESTYLE) monitoring kit 1 each by Does not apply  route 4 (four) times daily - after meals and at bedtime. 1 month Diabetic Testing Supplies for QAC-QHS accuchecks.Any brand OK 06/28/13   Thurnell Lose, MD  hydrALAZINE (APRESOLINE) 50 MG tablet Take 1 tablet (50 mg total) by mouth 3 (three) times daily. 06/28/13   Thurnell Lose, MD  insulin aspart (NOVOLOG) 100 UNIT/ML injection Before each meal 3 times a day, 140-199 - 2 units, 200-250 - 6 units, 251-299 - 8 units,  300-349 - 10 units,  350 or above 12 units. Dispense syringes and needles as needed, Ok to switch to PEN if approved. 06/28/13   Thurnell Lose, MD  insulin glargine (LANTUS) 100 UNIT/ML injection Inject 0.35 mLs (35 Units total) into the skin daily. dispense pen if approved, if not please dispense as needed syringes and needles - 1 month supply 06/28/13   Thurnell Lose, MD  labetalol (NORMODYNE) 200 MG tablet Take 400 mg by mouth 2 (two) times daily.     Historical Provider, MD  potassium chloride SA (K-DUR,KLOR-CON) 20 MEQ tablet Take 20 mEq by mouth 2 (two) times daily.     Historical Provider, MD  spironolactone (ALDACTONE) 25 MG tablet Take 25 mg by mouth daily.     Historical Provider, MD   BP 156/111 mmHg  Pulse 77  Temp(Src) 98.2 F (36.8 C) (Oral)  Resp 18  SpO2 93% Physical Exam  Constitutional: He is oriented to person, place, and time. He appears well-developed and well-nourished. No distress.  Neck: Normal range of motion. Neck supple.  Cardiovascular: Normal rate, regular rhythm and normal heart sounds.   Pulmonary/Chest: Effort normal and breath sounds normal.  Musculoskeletal: He exhibits edema.  Lymphadenopathy:    He has no cervical adenopathy.  Neurological: He is alert and oriented to person, place, and time.  Skin: Skin is warm and dry.  Nursing note and vitals reviewed.   ED Course  Procedures (including critical care time) Labs Review Labs Reviewed  POCT I-STAT, CHEM 8 - Abnormal; Notable for the following:    Potassium 3.4 (*)    BUN 27  (*)    Creatinine, Ser 2.10 (*)    Glucose, Bld 385 (*)    All other components within normal limits   i-stat abnl as noted. Imaging Review No results found.   MDM   1. 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    Billy Fischer, MD 03/26/14 [phone]

## 2014-03-26 NOTE — ED Notes (Signed)
Per family , patient has been having weak spells, SOB x 1 week. Reported insurance issues

## 2014-03-26 NOTE — ED Provider Notes (Signed)
CSN: [id number]     Arrival date & time 03/26/14  1441 History   First MD Initiated Contact with Patient 03/26/14 1612     Chief Complaint  Patient presents with  . Fatigue  . Hyperglycemia     (Consider location/radiation/quality/duration/timing/severity/associated sxs/prior Treatment) Patient is a 48 y.o. male presenting with hyperglycemia. The history is provided by the patient.  Hyperglycemia Associated symptoms: polyuria   Associated symptoms: no abdominal pain, no chest pain, no confusion, no dysuria, no fever, no shortness of breath and no vomiting   pt with hx iddm, reports in past week feeling tired, at times feeling as if sugar is high, at others low. +polyuria. No polydipsia. Was at urgent care and told glucose high and to come to ED.  Pt states takes his insuli 'some of the time', not regularly.  Is on set dose qday + novolog.  Is not checking blood sugars regularly. Denies fever or chills. No nvd. No abd pain. No headaches. No chest pain or sob. States is compliant w his other/pill medications. No recent wt loss or wt gain.     Past Medical History  Diagnosis Date  . Hypertension   . Renal disorder   . Sleep apnea   . CPAP (continuous positive airway pressure) dependence   . Diabetes mellitus without complication    Past Surgical History  Procedure Laterality Date  . No past surgeries     Family History  Problem Relation Age of Onset  . Hypertension Mother    History  Substance Use Topics  . Smoking status: Never Smoker   . Smokeless tobacco: Never Used  . Alcohol Use: No    Review of Systems  Constitutional: Negative for fever and chills.  HENT: Negative for sore throat.   Eyes: Negative for redness.  Respiratory: Negative for shortness of breath.   Cardiovascular: Negative for chest pain.  Gastrointestinal: Negative for vomiting, abdominal pain and diarrhea.  Endocrine: Positive for polyuria.  Genitourinary: Negative for dysuria and flank pain.   Musculoskeletal: Negative for back pain and neck pain.  Skin: Negative for rash.  Neurological: Negative for headaches.  Hematological: Does not bruise/bleed easily.  Psychiatric/Behavioral: Negative for confusion.      Allergies  Review of patient's allergies indicates no known allergies.  Home Medications   Prior to Admission medications   Medication Sig Start Date End Date Taking? Authorizing Provider  amLODipine (NORVASC) 5 MG tablet Take 5 mg by mouth 2 (two) times daily.    Historical Provider, MD  cloNIDine (CATAPRES) 0.2 MG tablet Take 0.2 mg by mouth 4 (four) times daily.     Historical Provider, MD  furosemide (LASIX) 80 MG tablet Take 120 mg by mouth daily.     Historical Provider, MD  glucose monitoring kit (FREESTYLE) monitoring kit 1 each by Does not apply route 4 (four) times daily - after meals and at bedtime. 1 month Diabetic Testing Supplies for QAC-QHS accuchecks.Any brand OK 06/28/13   Thurnell Lose, MD  hydrALAZINE (APRESOLINE) 50 MG tablet Take 1 tablet (50 mg total) by mouth 3 (three) times daily. 06/28/13   Thurnell Lose, MD  insulin aspart (NOVOLOG) 100 UNIT/ML injection Before each meal 3 times a day, 140-199 - 2 units, 200-250 - 6 units, 251-299 - 8 units,  300-349 - 10 units,  350 or above 12 units. Dispense syringes and needles as needed, Ok to switch to PEN if approved. 06/28/13   Thurnell Lose, MD  insulin glargine (LANTUS)  100 UNIT/ML injection Inject 0.35 mLs (35 Units total) into the skin daily. dispense pen if approved, if not please dispense as needed syringes and needles - 1 month supply 06/28/13   Thurnell Lose, MD  labetalol (NORMODYNE) 200 MG tablet Take 400 mg by mouth 2 (two) times daily.     Historical Provider, MD  potassium chloride SA (K-DUR,KLOR-CON) 20 MEQ tablet Take 20 mEq by mouth 2 (two) times daily.     Historical Provider, MD  spironolactone (ALDACTONE) 25 MG tablet Take 25 mg by mouth daily.     Historical Provider, MD   BP  162/106 mmHg  Pulse 73  Temp(Src) 97.6 F (36.4 C) (Oral)  Resp 18  SpO2 98% Physical Exam  Constitutional: He is oriented to person, place, and time. He appears well-developed and well-nourished. No distress.  HENT:  Nose: Nose normal.  Mouth/Throat: Oropharynx is clear and moist.  Eyes: Conjunctivae are normal. Pupils are equal, round, and reactive to light. No scleral icterus.  Neck: Neck supple. No tracheal deviation present.  Cardiovascular: Normal rate, regular rhythm, normal heart sounds and intact distal pulses.   Pulmonary/Chest: Effort normal and breath sounds normal. No accessory muscle usage. No respiratory distress.  Abdominal: Soft. Bowel sounds are normal. He exhibits no distension and no mass. There is no tenderness. There is no rebound and no guarding.  obese  Genitourinary:  No cva tenderness  Musculoskeletal: Normal range of motion. He exhibits no edema or tenderness.  Neurological: He is alert and oriented to person, place, and time.  Speech clear/fluent. Ambulates w steady gait.   Skin: Skin is warm and dry. He is not diaphoretic.  Psychiatric: He has a normal mood and affect.  Nursing note and vitals reviewed.   ED Course  Procedures (including critical care time) Labs Review  Results for orders placed or performed during the hospital encounter of 03/26/14  CBC  Result Value Ref Range   WBC 7.5 4.0 - 10.5 K/uL   RBC 4.65 4.22 - 5.81 MIL/uL   Hemoglobin 13.6 13.0 - 17.0 g/dL   HCT 40.0 39.0 - 52.0 %   MCV 86.0 78.0 - 100.0 fL   MCH 29.2 26.0 - 34.0 pg   MCHC 34.0 30.0 - 36.0 g/dL   RDW 13.4 11.5 - 15.5 %   Platelets 286 150 - 400 K/uL  Comprehensive metabolic panel  Result Value Ref Range   Sodium 135 (L) 137 - 147 mEq/L   Potassium 3.5 (L) 3.7 - 5.3 mEq/L   Chloride 94 (L) 96 - 112 mEq/L   CO2 27 19 - 32 mEq/L   Glucose, Bld 342 (H) 70 - 99 mg/dL   BUN 23 6 - 23 mg/dL   Creatinine, Ser 1.99 (H) 0.50 - 1.35 mg/dL   Calcium 9.8 8.4 - 10.5 mg/dL    Total Protein 7.6 6.0 - 8.3 g/dL   Albumin 3.4 (L) 3.5 - 5.2 g/dL   AST 13 0 - 37 U/L   ALT 14 0 - 53 U/L   Alkaline Phosphatase 104 39 - 117 U/L   Total Bilirubin 0.3 0.3 - 1.2 mg/dL   GFR calc non Af Amer 38 (L) >90 mL/min   GFR calc Af Amer 44 (L) >90 mL/min   Anion gap 14 5 - 15  Urinalysis, Routine w reflex microscopic  Result Value Ref Range   Color, Urine YELLOW YELLOW   APPearance CLEAR CLEAR   Specific Gravity, Urine 1.030 1.005 - 1.030   pH  5.0 5.0 - 8.0   Glucose, UA >1000 (A) NEGATIVE mg/dL   Hgb urine dipstick NEGATIVE NEGATIVE   Bilirubin Urine NEGATIVE NEGATIVE   Ketones, ur NEGATIVE NEGATIVE mg/dL   Protein, ur >300 (A) NEGATIVE mg/dL   Urobilinogen, UA 0.2 0.0 - 1.0 mg/dL   Nitrite NEGATIVE NEGATIVE   Leukocytes, UA NEGATIVE NEGATIVE  Urine microscopic-add on  Result Value Ref Range   Squamous Epithelial / LPF RARE RARE   WBC, UA 0-2 <3 WBC/hpf   Bacteria, UA RARE RARE   Casts HYALINE CASTS (A) NEGATIVE   Urine-Other MUCOUS PRESENT   CBG monitoring, ED  Result Value Ref Range   Glucose-Capillary 361 (H) 70 - 99 mg/dL  POC CBG, ED  Result Value Ref Range   Glucose-Capillary 434 (H) 70 - 99 mg/dL  CBG monitoring, ED  Result Value Ref Range   Glucose-Capillary 389 (H) 70 - 99 mg/dL  POC CBG, ED  Result Value Ref Range   Glucose-Capillary 292 (H) 70 - 99 mg/dL       MDM  Iv ns bolus. novolog sq. hco3 normal.  Reviewed nursing notes and prior charts for additional history.   novoog sq.  Additional ivf.  Recheck bs x few times.  Po fluids.  No pain, no new c/o.Marland Kitchen No fevers.  Recheck feels improved. No nv  bs 292. hco3 normal.  Discussed importance improved bp and diabetes control, including in context crF, and if continue eventual renal failure/hD, and other complications uncontrolled dm and htn.  Stress importance of bs monitoring, diabetic diet, close pcp fu (currently Dr Fredderick [place]ix).  Will also refer to diabetes program.    Mirna Mires, MD 03/26/14 2027

## 2014-03-26 NOTE — Discharge Instructions (Signed)
It was our pleasure to provide your ER care today - we hope that you feel better.  It is very important the you work with your doctor to better control your diabetes and blood pressure - failure to do so will lead to kidney failure, heart disease, and will put you at risk of stroke and other diabetic/hypertension illness.  Drink adequate water. Follow diabetic diet.  Limit salt intake.   Take your insulin and other medications as prescribed, do not skip or miss doses.  Check blood sugars 4x/day and record values.  Bring this record to your next primary care doctor appointment.  Follow up with your doctor in the next 2-3 days for recheck - call office to arrange appointment.  Also follow up with diabetes management program in coming week - call Monday to arrange.  Return to ER if worse, new symptoms, fevers, persistent vomiting, weak/faint, trouble breathing, other concern.       Hyperglycemia Hyperglycemia occurs when the glucose (sugar) in your blood is too high. Hyperglycemia can happen for many reasons, but it most often happens to people who do not know they have diabetes or are not managing their diabetes properly.  CAUSES  Whether you have diabetes or not, there are other causes of hyperglycemia. Hyperglycemia can occur when you have diabetes, but it can also occur in other situations that you might not be as aware of, such as: Diabetes  If you have diabetes and are having problems controlling your blood glucose, hyperglycemia could occur because of some of the following reasons:  Not following your meal plan.  Not taking your diabetes medications or not taking it properly.  Exercising less or doing less activity than you normally do.  Being sick. Pre-diabetes  This cannot be ignored. Before people develop Type 2 diabetes, they almost always have "pre-diabetes." This is when your blood glucose levels are higher than normal, but not yet high enough to be diagnosed as  diabetes. Research has shown that some long-term damage to the body, especially the heart and circulatory system, may already be occurring during pre-diabetes. If you take action to manage your blood glucose when you have pre-diabetes, you may delay or prevent Type 2 diabetes from developing. Stress  If you have diabetes, you may be "diet" controlled or on oral medications or insulin to control your diabetes. However, you may find that your blood glucose is higher than usual in the hospital whether you have diabetes or not. This is often referred to as "stress hyperglycemia." Stress can elevate your blood glucose. This happens because of hormones put out by the body during times of stress. If stress has been the cause of your high blood glucose, it can be followed regularly by your caregiver. That way he/she can make sure your hyperglycemia does not continue to get worse or progress to diabetes. Steroids  Steroids are medications that act on the infection fighting system (immune system) to block inflammation or infection. One side effect can be a rise in blood glucose. Most people can produce enough extra insulin to allow for this rise, but for those who cannot, steroids make blood glucose levels go even higher. It is not unusual for steroid treatments to "uncover" diabetes that is developing. It is not always possible to determine if the hyperglycemia will go away after the steroids are stopped. A special blood test called an A1c is sometimes done to determine if your blood glucose was elevated before the steroids were started. SYMPTOMS  Thirsty.  Frequent urination.  Dry mouth.  Blurred vision.  Tired or fatigue.  Weakness.  Sleepy.  Tingling in feet or leg. DIAGNOSIS  Diagnosis is made by monitoring blood glucose in one or all of the following ways:  A1c test. This is a chemical found in your blood.  Fingerstick blood glucose monitoring.  Laboratory results. TREATMENT  First,  knowing the cause of the hyperglycemia is important before the hyperglycemia can be treated. Treatment may include, but is not be limited to:  Education.  Change or adjustment in medications.  Change or adjustment in meal plan.  Treatment for an illness, infection, etc.  More frequent blood glucose monitoring.  Change in exercise plan.  Decreasing or stopping steroids.  Lifestyle changes. HOME CARE INSTRUCTIONS   Test your blood glucose as directed.  Exercise regularly. Your caregiver will give you instructions about exercise. Pre-diabetes or diabetes which comes on with stress is helped by exercising.  Eat wholesome, balanced meals. Eat often and at regular, fixed times. Your caregiver or nutritionist will give you a meal plan to guide your sugar intake.  Being at an ideal weight is important. If needed, losing as little as 10 to 15 pounds may help improve blood glucose levels. SEEK MEDICAL CARE IF:   You have questions about medicine, activity, or diet.  You continue to have symptoms (problems such as increased thirst, urination, or weight gain). SEEK IMMEDIATE MEDICAL CARE IF:   You are vomiting or have diarrhea.  Your breath smells fruity.  You are breathing faster or slower.  You are very sleepy or incoherent.  You have numbness, tingling, or pain in your feet or hands.  You have chest pain.  Your symptoms get worse even though you have been following your caregiver's orders.  If you have any other questions or concerns. Document Released: 09/19/2000 Document Revised: 06/18/2011 Document Reviewed: 07/23/2011 [facility] Patient Information 2015 [place], [place]. This information is not intended to replace advice given to you by your health care provider. Make sure you discuss any questions you have with your health care provider.    Diabetes Mellitus and Food It is important for you to manage your blood sugar (glucose) level. Your blood glucose level can be  greatly affected by what you eat. Eating healthier foods in the appropriate amounts throughout the day at about the same time each day will help you control your blood glucose level. It can also help slow or prevent worsening of your diabetes mellitus. Healthy eating may even help you improve the level of your blood pressure and reach or maintain a healthy weight.  HOW CAN FOOD AFFECT ME? Carbohydrates Carbohydrates affect your blood glucose level more than any other type of food. Your dietitian will help you determine how many carbohydrates to eat at each meal and teach you how to count carbohydrates. Counting carbohydrates is important to keep your blood glucose at a healthy level, especially if you are using insulin or taking certain medicines for diabetes mellitus. Alcohol Alcohol can cause sudden decreases in blood glucose (hypoglycemia), especially if you use insulin or take certain medicines for diabetes mellitus. Hypoglycemia can be a life-threatening condition. Symptoms of hypoglycemia (sleepiness, dizziness, and disorientation) are similar to symptoms of having too much alcohol.  If your health care provider has given you approval to drink alcohol, do so in moderation and use the following guidelines:  Women should not have more than one drink per day, and men should not have more than two drinks per day.  One drink is equal to:  12 oz of beer.  5 oz of wine.  1 oz of hard liquor.  Do not drink on an empty stomach.  Keep yourself hydrated. Have water, diet soda, or unsweetened iced tea.  Regular soda, juice, and other mixers might contain a lot of carbohydrates and should be counted. WHAT FOODS ARE NOT RECOMMENDED? As you make food choices, it is important to remember that all foods are not the same. Some foods have fewer nutrients per serving than other foods, even though they might have the same number of calories or carbohydrates. It is difficult to get your body what it needs when  you eat foods with fewer nutrients. Examples of foods that you should avoid that are high in calories and carbohydrates but low in nutrients include:  Trans fats (most processed foods list trans fats on the Nutrition Facts label).  Regular soda.  Juice.  Candy.  Sweets, such as cake, pie, doughnuts, and cookies.  Fried foods. WHAT FOODS CAN I EAT? Have nutrient-rich foods, which will nourish your body and keep you healthy. The food you should eat also will depend on several factors, including:  The calories you need.  The medicines you take.  Your weight.  Your blood glucose level.  Your blood pressure level.  Your cholesterol level. You also should eat a variety of foods, including:  Protein, such as meat, poultry, fish, tofu, nuts, and seeds (lean animal proteins are best).  Fruits.  Vegetables.  Dairy products, such as milk, cheese, and yogurt (low fat is best).  Breads, grains, pasta, cereal, rice, and beans.  Fats such as olive oil, trans fat-free margarine, canola oil, avocado, and olives. DOES EVERYONE WITH DIABETES MELLITUS HAVE THE SAME MEAL PLAN? Because every person with diabetes mellitus is different, there is not one meal plan that works for everyone. It is very important that you meet with a dietitian who will help you create a meal plan that is just right for you. Document Released: 12/21/2004 Document Revised: 03/31/2013 Document Reviewed: 02/20/2013 [facility] Patient Information 2015 [place], [place]. This information is not intended to replace advice given to you by your health care provider. Make sure you discuss any questions you have with your health care provider.    How to Avoid Diabetes Problems You can do a lot to prevent or slow down diabetes problems. Following your diabetes plan and taking care of yourself can reduce your risk of serious or life-threatening complications. Below, you will find certain things you can do to prevent diabetes  problems. MANAGE YOUR DIABETES Follow your health care provider's, nurse educator's, and dietitian's instructions for managing your diabetes. They will teach you the basics of diabetes care. They can help answer questions you may have. Learn about diabetes and make healthy choices regarding eating and physical activity. Monitor your blood glucose level regularly. Your health care provider will help you decide how often to check your blood glucose level depending on your treatment goals and how well you are meeting them.  DO NOT USE NICOTINE Nicotine and diabetes are a dangerous combination. Nicotine raises your risk for diabetes problems. If you quit using nicotine, you will lower your risk for heart attack, stroke, nerve disease, and kidney disease. Your cholesterol and your blood pressure levels may improve. Your blood circulation will also improve. Do not use any tobacco products, including cigarettes, chewing tobacco, or electronic cigarettes. If you need help quitting, ask your health care provider. KEEP YOUR BLOOD PRESSURE  UNDER CONTROL Keeping your blood pressure under control will help prevent damage to your eyes, kidneys, heart, and blood vessels. Blood pressure consists of two numbers. The top number should be below 120, and the bottom number should be below 80 (120/80). Keep your blood pressure as close to these numbers as you can. If you already have kidney disease, you may want even lower blood pressure to protect your kidneys. Talk to your health care provider to make sure that your blood pressure goal is right for your needs. Meal planning, medicines, and exercise can help you reach your blood pressure target. Have your blood pressure checked at every visit with your health care provider. KEEP YOUR CHOLESTEROL UNDER CONTROL Normal cholesterol levels will help prevent heart disease and stroke. These are the biggest health problems for people with diabetes. Keeping cholesterol levels under  control can also help with blood flow. Have your cholesterol level checked at least once a year. Your health care provider may prescribe a medicine known as a statin. Statins lower your cholesterol. If you are not taking a statin, ask your health care provider if you should be. Meal planning, exercise, and medicines can help you reach your cholesterol targets.  SCHEDULE AND KEEP YOUR ANNUAL PHYSICAL EXAMS AND EYE EXAMS Your health care provider will tell you how often he or she wants to see you depending on your plan of treatment. It is important that you keep these appointments so that possible problems can be identified early and complications can be avoided or treated.  Every visit with your health care provider should include your weight, blood pressure, and an evaluation of your blood glucose control.  Your hemoglobin A1c should be checked:  At least twice a year if you are at your goal.  Every 3 months if there are changes in treatment.  If you are not meeting your goals.  Your blood lipids should be checked yearly. You should also be checked yearly to see if you have protein in your urine (microalbumin).  Schedule a dilated eye exam within 5 years of your diagnosis if you have type 1 diabetes, and then yearly. Schedule a dilated eye exam at diagnosis if you have type 2 diabetes, and then yearly. All exams thereafter can be extended to every 2 to 3 years if one or more exams have been normal. KEEP YOUR VACCINES CURRENT The flu vaccine is recommended yearly. The formula for the vaccine changes every year and needs to be updated for the best protection against current viruses. It is recommended that people with diabetes who are over 22 years old get the pneumonia vaccine. In some cases, two separate shots may be given. Ask your health care provider if your pneumonia vaccination is up-to-date. However, there are some instances where another vaccine is recommended. Check with your health care  provider. TAKE CARE OF YOUR FEET  Diabetes may cause you to have a poor blood supply (circulation) to your legs and feet. Because of this, the skin may be thinner, break easier, and heal more slowly. You also may have nerve damage in your legs and feet, causing decreased feeling. You may not notice minor injuries to your feet that could lead to serious problems or infections. Taking care of your feet is very important. Visual foot exams are performed at every routine medical visit. The exams check for cuts, injuries, or other problems with the feet. A comprehensive foot exam should be done yearly. This includes visual inspection as well as assessing  foot pulses and testing for loss of sensation. You should also do the following:  Inspect your feet daily for cuts, calluses, blisters, ingrown toenails, and signs of infection, such as redness, swelling, or pus.  Wash and dry your feet thoroughly, especially between the toes.  Avoid soaking your feet regularly in hot water baths.  Moisturize dry skin with lotion, avoiding areas between your toes.  Cut toenails straight across and file the edges.  Avoid shoes that do not fit well or have areas that irritate your skin.  Avoid going barefooted or wearing only socks. Your feet need protection. TAKE CARE OF YOUR TEETH People with poorly controlled diabetes are more likely to have gum (periodontal) disease. These infections make diabetes harder to control. Periodontal diseases, if left untreated, can lead to tooth loss. Brush your teeth twice a day, floss, and see your dentist for checkups and cleaning every 6 months, or 2 times a year. ASK YOUR HEALTH CARE PROVIDER ABOUT TAKING ASPIRIN Taking aspirin daily is recommended to help prevent cardiovascular disease in people with and without diabetes. Ask your health care provider if this would benefit you and what dose he or she would recommend. DRINK RESPONSIBLY Moderate amounts of alcohol (less than 1  drink per day for adult women and less than 2 drinks per day for adult men) have a minimal effect on blood glucose if ingested with food. It is important to eat food with alcohol to avoid hypoglycemia. People should avoid alcohol if they have a history of alcohol abuse or dependence, if they are pregnant, and if they have liver disease, pancreatitis, advanced neuropathy, or severe hypertriglyceridemia. LESSEN STRESS Living with diabetes can be stressful. When you are under stress, your blood glucose may be affected in two ways:  Stress hormones may cause your blood glucose to rise.  You may be distracted from taking good care of yourself. It is a good idea to be aware of your stress level and make changes that are necessary to help you better manage challenging situations. Support groups, planned relaxation, a hobby you enjoy, meditation, healthy relationships, and exercise all work to lower your stress level. If your efforts do not seem to be helping, get help from your health care provider or a trained mental health professional. Document Released: 12/12/2010 Document Revised: 08/10/2013 Document Reviewed: 05/20/2013 [facility] Patient Information 2015 [place], [place]. This information is not intended to replace advice given to you by your health care provider. Make sure you discuss any questions you have with your health care provider.     Hypertension Hypertension, commonly called high blood pressure, is when the force of blood pumping through your arteries is too strong. Your arteries are the blood vessels that carry blood from your heart throughout your body. A blood pressure reading consists of a higher number over a lower number, such as 110/72. The higher number (systolic) is the pressure inside your arteries when your heart pumps. The lower number (diastolic) is the pressure inside your arteries when your heart relaxes. Ideally you want your blood pressure below 120/80. Hypertension forces  your heart to work harder to pump blood. Your arteries may become narrow or stiff. Having hypertension puts you at risk for heart disease, stroke, and other problems.  RISK FACTORS Some risk factors for high blood pressure are controllable. Others are not.  Risk factors you cannot control include:   Race. You may be at higher risk if you are African American.  Age. Risk increases with age.  Gender. Men are at higher risk than women before age 66 years. After age 81, women are at higher risk than men. Risk factors you can control include:  Not getting enough exercise or physical activity.  Being overweight.  Getting too much fat, sugar, calories, or salt in your diet.  Drinking too much alcohol. SIGNS AND SYMPTOMS Hypertension does not usually cause signs or symptoms. Extremely high blood pressure (hypertensive crisis) may cause headache, anxiety, shortness of breath, and nosebleed. DIAGNOSIS  To check if you have hypertension, your health care provider will measure your blood pressure while you are seated, with your arm held at the level of your heart. It should be measured at least twice using the same arm. Certain conditions can cause a difference in blood pressure between your right and left arms. A blood pressure reading that is higher than normal on one occasion does not mean that you need treatment. If one blood pressure reading is high, ask your health care provider about having it checked again. TREATMENT  Treating high blood pressure includes making lifestyle changes and possibly taking medicine. Living a healthy lifestyle can help lower high blood pressure. You may need to change some of your habits. Lifestyle changes may include:  Following the DASH diet. This diet is high in fruits, vegetables, and whole grains. It is low in salt, red meat, and added sugars.  Getting at least 2 hours of brisk physical activity every week.  Losing weight if necessary.  Not  smoking.  Limiting alcoholic beverages.  Learning ways to reduce stress. If lifestyle changes are not enough to get your blood pressure under control, your health care provider may prescribe medicine. You may need to take more than one. Work closely with your health care provider to understand the risks and benefits. HOME CARE INSTRUCTIONS  Have your blood pressure rechecked as directed by your health care provider.   Take medicines only as directed by your health care provider. Follow the directions carefully. Blood pressure medicines must be taken as prescribed. The medicine does not work as well when you skip doses. Skipping doses also puts you at risk for problems.   Do not smoke.   Monitor your blood pressure at home as directed by your health care provider. SEEK MEDICAL CARE IF:   You think you are having a reaction to medicines taken.  You have recurrent headaches or feel dizzy.  You have swelling in your ankles.  You have trouble with your vision. SEEK IMMEDIATE MEDICAL CARE IF:  You develop a severe headache or confusion.  You have unusual weakness, numbness, or feel faint.  You have severe chest or abdominal pain.  You vomit repeatedly.  You have trouble breathing. MAKE SURE YOU:   Understand these instructions.  Will watch your condition.  Will get help right away if you are not doing well or get worse. Document Released: 03/26/2005 Document Revised: 08/10/2013 Document Reviewed: 01/16/2013 [facility] Patient Information 2015 [place], [place]. This information is not intended to replace advice given to you by your health care provider. Make sure you discuss any questions you have with your health care provider.

## 2014-08-04 ENCOUNTER — Other Ambulatory Visit: Payer: Self-pay | Admitting: Internal Medicine

## 2014-08-13 ENCOUNTER — Encounter: Payer: Medicare Other | Attending: Geriatric Medicine | Admitting: *Deleted

## 2014-08-13 VITALS — Ht 66.5 in | Wt 237.8 lb

## 2014-08-13 DIAGNOSIS — E1165 Type 2 diabetes mellitus with hyperglycemia: Secondary | ICD-10-CM

## 2014-08-13 DIAGNOSIS — E118 Type 2 diabetes mellitus with unspecified complications: Secondary | ICD-10-CM | POA: Insufficient documentation

## 2014-08-13 DIAGNOSIS — Z713 Dietary counseling and surveillance: Secondary | ICD-10-CM | POA: Diagnosis not present

## 2014-08-13 DIAGNOSIS — Z794 Long term (current) use of insulin: Secondary | ICD-10-CM | POA: Diagnosis not present

## 2014-08-13 DIAGNOSIS — IMO0002 Reserved for concepts with insufficient information to code with codable children: Secondary | ICD-10-CM

## 2014-08-13 NOTE — Patient Instructions (Addendum)
Plan:  Aim for 3 Carb Choices per meal (45 grams) +/- 1 either way  Aim for 0-1 Carbs per snack if hungry  Include protein in moderation with your meals and snacks Consider reading food labels for Total Carbohydrate of foods Continue with your activity level by cycling and walking daily as tolerated Continue checking BG at alternate times per day   Continue taking insulin as prescribed

## 2014-08-13 NOTE — Progress Notes (Signed)
Appt start time: 0830 end time:  0915.  Assessment:  Patient was seen on  08/13/14 for individual diabetes education follow up visit. He states since our last visit almost a year ago, he ran out of insulin for several months and his BG's were running in the 400's or the meter said "HI". He is now on Medicare.and can get insulin again. He states he is currently on Lantus insulin at night @ 35 units and takes Sliding Scale Novolog if BG are above 140 mg/dl or higher.  Current HbA1c: current value not available  Preferred Learning Style:   Hands on  MEDICATIONS: see list, diabetes medications are Lantus @ 35 units at night and Novolog @ sliding scale if BG is above 140 mg/dl which he hasn't needed since out of the hospital.  DIETARY INTAKE:  24-hr recall:  B ( AM): Raisin Bran cereal with 2% milk OR bacon, eggs and waffle or grits, water or fruit juice Snk ( AM): no  L ( PM): skips if he forgets- sandwich, fresh fruit, water Snk ( PM): not unless fresh fruit D ( PM): lean meat, starch, salad, green vegetable, water or flavored water Snk ( PM): occasionally bowl of cereal with milk OR canned fruit Beverages: water, fruit juice, flavored water  Usual physical activity: gym 4-5 days a week and he walks  Estimated energy needs: 1600 calories 180 g carbohydrates 120 g protein 44 g fat  Progress Towards Goal(s):  In progress.   Nutritional Diagnosis:  NB-1.1 Food and nutrition-related knowledge deficit As related to diabetes.  As evidenced by A1c of 12.3%.    Intervention:  Nutrition counseling provided.  Reviewed insulin action and rationale of taking insulin as prescribed now that he is on Medicare and can have access to it  Reviewed SMBG and target ranges to help improve his A1c  Patient familiar with Carb Counting and agrees to pursue once again  Plan:  Aim for 3 Carb Choices per meal (45 grams) +/- 1 either way  Aim for 0-1 Carbs per snack if hungry  Include protein in  moderation with your meals and snacks Consider reading food labels for Total Carbohydrate of foods Continue with your activity level by cycling and walking daily as tolerated Continue checking BG at alternate times per day  Continue taking insulin as prescribed  Teaching Method Utilized: Visual and Auditory   Handouts given during visit include: additional copies of: Living Well with Diabetes Carb Counting and Food Label handouts Meal Plan Card Insulin Action handout  Barriers to learning/adherence to lifestyle change: financial  Diabetes self-care support plan:   [facility] support group  Demonstrated degree of understanding via:  Teach Back   Monitoring/Evaluation:  Dietary intake, exercise, reading food labels, and body weight PRN.

## 2014-08-18 ENCOUNTER — Encounter: Payer: Self-pay | Admitting: *Deleted

## 2014-12-23 DIAGNOSIS — F419 Anxiety disorder, unspecified: Secondary | ICD-10-CM | POA: Insufficient documentation

## 2014-12-27 ENCOUNTER — Encounter (HOSPITAL_COMMUNITY): Payer: Self-pay | Admitting: Emergency Medicine

## 2014-12-27 ENCOUNTER — Emergency Department (HOSPITAL_COMMUNITY): Payer: Medicare Other

## 2014-12-27 ENCOUNTER — Inpatient Hospital Stay (HOSPITAL_COMMUNITY)
Admission: EM | Admit: 2014-12-27 | Discharge: 2014-12-29 | DRG: 684 | Disposition: A | Discharge status: Home Health | Payer: Medicare Other | Attending: Family Medicine | Admitting: Family Medicine

## 2014-12-27 DIAGNOSIS — I129 Hypertensive chronic kidney disease with stage 1 through stage 4 chronic kidney disease, or unspecified chronic kidney disease: Secondary | ICD-10-CM | POA: Diagnosis present

## 2014-12-27 DIAGNOSIS — F5104 Psychophysiologic insomnia: Secondary | ICD-10-CM | POA: Diagnosis present

## 2014-12-27 DIAGNOSIS — I160 Hypertensive urgency: Secondary | ICD-10-CM

## 2014-12-27 DIAGNOSIS — Z8249 Family history of ischemic heart disease and other diseases of the circulatory system: Secondary | ICD-10-CM | POA: Diagnosis not present

## 2014-12-27 DIAGNOSIS — E119 Type 2 diabetes mellitus without complications: Secondary | ICD-10-CM | POA: Diagnosis present

## 2014-12-27 DIAGNOSIS — G4733 Obstructive sleep apnea (adult) (pediatric): Secondary | ICD-10-CM | POA: Diagnosis present

## 2014-12-27 DIAGNOSIS — F4323 Adjustment disorder with mixed anxiety and depressed mood: Secondary | ICD-10-CM | POA: Diagnosis not present

## 2014-12-27 DIAGNOSIS — E1149 Type 2 diabetes mellitus with other diabetic neurological complication: Secondary | ICD-10-CM | POA: Diagnosis present

## 2014-12-27 DIAGNOSIS — F3341 Major depressive disorder, recurrent, in partial remission: Secondary | ICD-10-CM

## 2014-12-27 DIAGNOSIS — R531 Weakness: Secondary | ICD-10-CM

## 2014-12-27 DIAGNOSIS — Z9114 Patient's other noncompliance with medication regimen: Secondary | ICD-10-CM | POA: Diagnosis present

## 2014-12-27 DIAGNOSIS — Z79899 Other long term (current) drug therapy: Secondary | ICD-10-CM

## 2014-12-27 DIAGNOSIS — F419 Anxiety disorder, unspecified: Secondary | ICD-10-CM

## 2014-12-27 DIAGNOSIS — F411 Generalized anxiety disorder: Secondary | ICD-10-CM | POA: Diagnosis present

## 2014-12-27 DIAGNOSIS — Z7982 Long term (current) use of aspirin: Secondary | ICD-10-CM | POA: Diagnosis not present

## 2014-12-27 DIAGNOSIS — F410 Panic disorder [episodic paroxysmal anxiety] without agoraphobia: Secondary | ICD-10-CM

## 2014-12-27 DIAGNOSIS — E1165 Type 2 diabetes mellitus with hyperglycemia: Secondary | ICD-10-CM | POA: Diagnosis present

## 2014-12-27 DIAGNOSIS — R51 Headache: Secondary | ICD-10-CM | POA: Diagnosis present

## 2014-12-27 DIAGNOSIS — I674 Hypertensive encephalopathy: Secondary | ICD-10-CM | POA: Diagnosis present

## 2014-12-27 DIAGNOSIS — F4322 Adjustment disorder with anxiety: Secondary | ICD-10-CM | POA: Diagnosis present

## 2014-12-27 DIAGNOSIS — E1142 Type 2 diabetes mellitus with diabetic polyneuropathy: Secondary | ICD-10-CM | POA: Diagnosis present

## 2014-12-27 DIAGNOSIS — H538 Other visual disturbances: Secondary | ICD-10-CM

## 2014-12-27 DIAGNOSIS — E1122 Type 2 diabetes mellitus with diabetic chronic kidney disease: Secondary | ICD-10-CM | POA: Diagnosis present

## 2014-12-27 DIAGNOSIS — N183 Chronic kidney disease, stage 3 (moderate): Secondary | ICD-10-CM | POA: Diagnosis present

## 2014-12-27 DIAGNOSIS — N179 Acute kidney failure, unspecified: Secondary | ICD-10-CM | POA: Diagnosis not present

## 2014-12-27 DIAGNOSIS — Z794 Long term (current) use of insulin: Secondary | ICD-10-CM | POA: Diagnosis not present

## 2014-12-27 DIAGNOSIS — E1129 Type 2 diabetes mellitus with other diabetic kidney complication: Secondary | ICD-10-CM | POA: Diagnosis not present

## 2014-12-27 DIAGNOSIS — I10 Essential (primary) hypertension: Secondary | ICD-10-CM | POA: Diagnosis not present

## 2014-12-27 DIAGNOSIS — I161 Hypertensive emergency: Secondary | ICD-10-CM | POA: Diagnosis present

## 2014-12-27 HISTORY — DX: Essential (primary) hypertension: I10

## 2014-12-27 HISTORY — DX: Type 2 diabetes mellitus with other diabetic neurological complication: E11.49

## 2014-12-27 HISTORY — DX: Hypertensive encephalopathy: I67.4

## 2014-12-27 HISTORY — DX: Adjustment disorder with mixed anxiety and depressed mood: F43.23

## 2014-12-27 HISTORY — DX: Hypertensive emergency: I16.1

## 2014-12-27 HISTORY — DX: Acute kidney failure, unspecified: N17.9

## 2014-12-27 HISTORY — DX: Type 2 diabetes mellitus with other diabetic kidney complication: E11.29

## 2014-12-27 HISTORY — DX: Panic disorder (episodic paroxysmal anxiety): F41.0

## 2014-12-27 LAB — DIFFERENTIAL
BASOS PCT: 0 %
Basophils Absolute: 0 10*3/uL (ref 0.0–0.1)
EOS PCT: 1 %
Eosinophils Absolute: 0.1 10*3/uL (ref 0.0–0.7)
Lymphocytes Relative: 28 %
Lymphs Abs: 2.1 10*3/uL (ref 0.7–4.0)
MONO ABS: 0.4 10*3/uL (ref 0.1–1.0)
Monocytes Relative: 5 %
NEUTROS ABS: 4.9 10*3/uL (ref 1.7–7.7)
NEUTROS PCT: 66 %

## 2014-12-27 LAB — COMPREHENSIVE METABOLIC PANEL
ALBUMIN: 3.4 g/dL — AB (ref 3.5–5.0)
ALT: 12 U/L — ABNORMAL LOW (ref 17–63)
ANION GAP: 8 (ref 5–15)
AST: 17 U/L (ref 15–41)
Alkaline Phosphatase: 65 U/L (ref 38–126)
BUN: 29 mg/dL — ABNORMAL HIGH (ref 6–20)
CHLORIDE: 104 mmol/L (ref 101–111)
CO2: 26 mmol/L (ref 22–32)
Calcium: 9.4 mg/dL (ref 8.9–10.3)
Creatinine, Ser: 2.9 mg/dL — ABNORMAL HIGH (ref 0.61–1.24)
GFR calc non Af Amer: 24 mL/min — ABNORMAL LOW (ref >60)
GFR, EST AFRICAN AMERICAN: 28 mL/min — AB (ref >60)
GLUCOSE: 159 mg/dL — AB (ref 65–99)
Potassium: 3.9 mmol/L (ref 3.5–5.1)
SODIUM: 138 mmol/L (ref 135–145)
Total Bilirubin: 0.7 mg/dL (ref 0.3–1.2)
Total Protein: 7.1 g/dL (ref 6.5–8.1)

## 2014-12-27 LAB — CBC
HCT: 37.9 % — ABNORMAL LOW (ref 39.0–52.0)
Hemoglobin: 12.7 g/dL — ABNORMAL LOW (ref 13.0–17.0)
MCH: 29.9 pg (ref 26.0–34.0)
MCHC: 33.5 g/dL (ref 30.0–36.0)
MCV: 89.2 fL (ref 78.0–100.0)
PLATELETS: 262 10*3/uL (ref 150–400)
RBC: 4.25 MIL/uL (ref 4.22–5.81)
RDW: 13.1 % (ref 11.5–15.5)
WBC: 7.5 10*3/uL (ref 4.0–10.5)

## 2014-12-27 LAB — RAPID URINE DRUG SCREEN, HOSP PERFORMED
AMPHETAMINES: NOT DETECTED
Barbiturates: NOT DETECTED
Benzodiazepines: NOT DETECTED
Cocaine: NOT DETECTED
Opiates: NOT DETECTED
Tetrahydrocannabinol: NOT DETECTED

## 2014-12-27 LAB — MRSA PCR SCREENING: MRSA by PCR: NEGATIVE

## 2014-12-27 LAB — I-STAT CHEM 8, ED
BUN: 31 mg/dL — ABNORMAL HIGH (ref 6–20)
Calcium, Ion: 1.21 mmol/L (ref 1.12–1.23)
Chloride: 102 mmol/L (ref 101–111)
Creatinine, Ser: 2.7 mg/dL — ABNORMAL HIGH (ref 0.61–1.24)
Glucose, Bld: 160 mg/dL — ABNORMAL HIGH (ref 65–99)
HEMATOCRIT: 42 % (ref 39.0–52.0)
HEMOGLOBIN: 14.3 g/dL (ref 13.0–17.0)
POTASSIUM: 3.8 mmol/L (ref 3.5–5.1)
SODIUM: 139 mmol/L (ref 135–145)
TCO2: 23 mmol/L (ref 0–100)

## 2014-12-27 LAB — CBG MONITORING, ED: GLUCOSE-CAPILLARY: 164 mg/dL — AB (ref 65–99)

## 2014-12-27 LAB — GLUCOSE, CAPILLARY: Glucose-Capillary: 141 mg/dL — ABNORMAL HIGH (ref 65–99)

## 2014-12-27 LAB — I-STAT TROPONIN, ED: Troponin i, poc: 0.05 ng/mL (ref 0.00–0.08)

## 2014-12-27 LAB — ACETAMINOPHEN LEVEL

## 2014-12-27 LAB — SALICYLATE LEVEL

## 2014-12-27 LAB — APTT: aPTT: 36 seconds (ref 24–37)

## 2014-12-27 LAB — PROTIME-INR
INR: 1.12 (ref 0.00–1.49)
PROTHROMBIN TIME: 14.6 s (ref 11.6–15.2)

## 2014-12-27 LAB — ETHANOL

## 2014-12-27 MED ORDER — HYDRALAZINE HCL 20 MG/ML IJ SOLN
10.0000 mg | INTRAMUSCULAR | Status: DC | PRN
  Administered 2014-12-29: 10 mg via INTRAVENOUS
  Filled 2014-12-27: qty 1

## 2014-12-27 MED ORDER — INSULIN ASPART 100 UNIT/ML ~~LOC~~ SOLN: 0.0000 [IU] | Freq: Every day | SUBCUTANEOUS | Status: DC

## 2014-12-27 MED ORDER — ALBUTEROL SULFATE (2.5 MG/3ML) 0.083% IN NEBU: 2.5000 mg | INHALATION_SOLUTION | Freq: Four times a day (QID) | RESPIRATORY_TRACT | Status: DC | PRN

## 2014-12-27 MED ORDER — SODIUM CHLORIDE 0.9 % IJ SOLN
3.0000 mL | Freq: Two times a day (BID) | INTRAMUSCULAR | Status: DC
  Administered 2014-12-28: 3 mL via INTRAVENOUS

## 2014-12-27 MED ORDER — INSULIN ASPART 100 UNIT/ML ~~LOC~~ SOLN
0.0000 [IU] | Freq: Three times a day (TID) | SUBCUTANEOUS | Status: DC
  Administered 2014-12-28 (×2): 3 [IU] via SUBCUTANEOUS
  Administered 2014-12-29 (×3): 2 [IU] via SUBCUTANEOUS

## 2014-12-27 MED ORDER — SODIUM CHLORIDE 0.9 % IV SOLN: 250.0000 mL | INTRAVENOUS | Status: DC | PRN

## 2014-12-27 MED ORDER — CLONIDINE HCL 0.2 MG PO TABS
0.2000 mg | ORAL_TABLET | Freq: Once | ORAL | Status: AC
  Administered 2014-12-27: 0.2 mg via ORAL
  Filled 2014-12-27: qty 1

## 2014-12-27 MED ORDER — ASPIRIN EC 81 MG PO TBEC
81.0000 mg | DELAYED_RELEASE_TABLET | Freq: Every day | ORAL | Status: DC
  Administered 2014-12-28 – 2014-12-29 (×2): 81 mg via ORAL
  Filled 2014-12-27 (×2): qty 1

## 2014-12-27 MED ORDER — SODIUM CHLORIDE 0.9 % IJ SOLN
3.0000 mL | Freq: Two times a day (BID) | INTRAMUSCULAR | Status: DC
  Administered 2014-12-28 (×3): 3 mL via INTRAVENOUS

## 2014-12-27 MED ORDER — INSULIN GLARGINE 100 UNIT/ML ~~LOC~~ SOLN
5.0000 [IU] | Freq: Every day | SUBCUTANEOUS | Status: DC
  Administered 2014-12-28 (×2): 5 [IU] via SUBCUTANEOUS
  Filled 2014-12-27 (×4): qty 0.05

## 2014-12-27 MED ORDER — BUSPIRONE HCL 15 MG PO TABS
7.5000 mg | ORAL_TABLET | Freq: Two times a day (BID) | ORAL | Status: DC
  Administered 2014-12-28 – 2014-12-29 (×4): 7.5 mg via ORAL
  Filled 2014-12-27 (×4): qty 1

## 2014-12-27 MED ORDER — INFLUENZA VAC SPLIT QUAD 0.5 ML IM SUSY
0.5000 mL | PREFILLED_SYRINGE | INTRAMUSCULAR | Status: AC
  Administered 2014-12-28: 0.5 mL via INTRAMUSCULAR
  Filled 2014-12-27: qty 0.5

## 2014-12-27 MED ORDER — HYDRALAZINE HCL 20 MG/ML IJ SOLN
10.0000 mg | INTRAMUSCULAR | Status: AC
  Administered 2014-12-27: 10 mg via INTRAVENOUS
  Filled 2014-12-27: qty 1

## 2014-12-27 MED ORDER — ENOXAPARIN SODIUM 40 MG/0.4ML ~~LOC~~ SOLN
40.0000 mg | SUBCUTANEOUS | Status: DC
  Administered 2014-12-27 – 2014-12-29 (×3): 40 mg via SUBCUTANEOUS
  Filled 2014-12-27 (×3): qty 0.4

## 2014-12-27 MED ORDER — ACETAMINOPHEN 325 MG PO TABS
650.0000 mg | ORAL_TABLET | Freq: Four times a day (QID) | ORAL | Status: DC | PRN
  Administered 2014-12-28 – 2014-12-29 (×2): 650 mg via ORAL
  Filled 2014-12-27 (×2): qty 2

## 2014-12-27 MED ORDER — ACETAMINOPHEN 650 MG RE SUPP: 650.0000 mg | Freq: Four times a day (QID) | RECTAL | Status: DC | PRN

## 2014-12-27 MED ORDER — ALBUTEROL SULFATE HFA 108 (90 BASE) MCG/ACT IN AERS
1.0000 | INHALATION_SPRAY | Freq: Four times a day (QID) | RESPIRATORY_TRACT | Status: DC | PRN
  Filled 2014-12-27: qty 6.7

## 2014-12-27 MED ORDER — SODIUM CHLORIDE 0.9 % IJ SOLN: 3.0000 mL | INTRAMUSCULAR | Status: DC | PRN

## 2014-12-27 MED ORDER — LABETALOL HCL 200 MG PO TABS
200.0000 mg | ORAL_TABLET | Freq: Two times a day (BID) | ORAL | Status: DC
  Administered 2014-12-28 – 2014-12-29 (×4): 200 mg via ORAL
  Filled 2014-12-27 (×4): qty 1

## 2014-12-27 MED ORDER — CLONIDINE HCL 0.1 MG PO TABS
0.1000 mg | ORAL_TABLET | Freq: Three times a day (TID) | ORAL | Status: DC
  Administered 2014-12-28 – 2014-12-29 (×6): 0.1 mg via ORAL
  Filled 2014-12-27 (×6): qty 1

## 2014-12-27 MED ORDER — BUSPIRONE HCL 15 MG PO TABS: 7.5000 mg | ORAL_TABLET | Freq: Two times a day (BID) | ORAL | Status: DC

## 2014-12-27 NOTE — ED Notes (Signed)
Patient transported to CT 

## 2014-12-27 NOTE — Progress Notes (Signed)
NURSING PROGRESS NOTE  [name] [id number] Admission Data: 12/27/2014 6:06 PM Attending Provider: Blane Ohara McDiarmid, MD [id number], Mallie Mussel, MD Code Status: Full  [name] is a 49 y.o. male patient admitted from ED:  -No acute distress noted.  -No complaints of shortness of breath.  -No complaints of chest pain.   Cardiac Monitoring: Box # 17 in place. Cardiac monitor yields:normal sinus rhythm.  Blood pressure 158/105, pulse 70, temperature 98.3 F (36.8 C), temperature source Oral, resp. rate 15, height 5\' 6"  (1.676 m), weight 115.44 kg (254 lb 8 oz), SpO2 99 %.   IV Fluids:  IV in place, occlusive dsg intact without redness, IV cath antecubital left, condition patent and no redness none.   Allergies:  Review of patient's allergies indicates no known allergies.  Past Medical History:   has a past medical history of Hypertension; Renal disorder; Sleep apnea; CPAP (continuous positive airway pressure) dependence; and Diabetes mellitus without complication.  Past Surgical History:   has past surgical history that includes No past surgeries.  Social History:   reports that he has never smoked. He has never used smokeless tobacco. He reports that he does not drink alcohol or use illicit drugs.  Skin: Intact  Patient/Family orientated to room. Information packet given to patient/family. Admission inpatient armband information verified with patient/family to include name and date of birth and placed on patient arm. Side rails up x 2, fall assessment and education completed with patient/family. Patient/family able to verbalize understanding of risk associated with falls and verbalized understanding to call for assistance before getting out of bed. Call light within reach. Patient/family able to voice and demonstrate understanding of unit orientation instructions.    Will continue to evaluate and treat per MD orders.

## 2014-12-27 NOTE — ED Notes (Signed)
CT called for urgen transport to cT.

## 2014-12-27 NOTE — H&P (Signed)
[facility] Admission History and Physical Service Pager: [phone]  Patient name: [name] Medical record number: [id number] Date of birth: 1966-02-16 Age: 49 y.o. Gender: male  Primary Care Provider: Reymundo Poll, MD Consultants: none Code Status: FULL  Chief Complaint: slurred speech, somnolence, elevated BP at home 240/140  Assessment and Plan: [name] is a 49 y.o. male presenting with slurred speech, word finding problems, and somnolence in the setting of elevated BP 240/140 consistent with hypertensive emergency. PMH is significant for DM2, uncontrolled HTN, CKDIII, OSA.   Hypertensive Emergency: AMS and slurred speech seemed to have resolved on presentation to ED.Possibly hypertensive encephalopathy? Patient is still somnolent, however answers questions appropriately and notes of HA. Neuro exam unremarkable, and fundoscopic exam did not show retinal hemorrhages or papilledema although exam was limited. CT head unremarkable. CBC and CMP unremarkable except for slight increase of Cr to 2.9 (BL ~1.9-2.2) which could also be secondary to this hypertensive episode. EKG unremarkable with trop-i 0.05. Per chart review care everywhere, renal doppler 05/2012 (no RAS per nephrology note).  Questionable compliance to anti-hypertensive medications (home meds include: Lasix 120mg  daily, Hydralazine 50mg  TID, Aldactone 25mg  daily, Clonidine 0.2mg  TID, Labetalol 400mg  TID) however these have not been filled regularly from contacting the pharmacy. Possible component of anxiety as well. Patient denies h/o alcohol use making alcohol withdraw less likely etiology of elevated BPs. Would like to rule out drugs use such as cocaine as well.  - admit to telemetry - holding home hydralazine 50 TID, Lasix 120mg  daily, and Aldactone 25mg  daily as we are unsure of compliance and to avoid drastic lowering of BP   - continue ASA - Labetalol 200mg  BID (decreased from home Labetalol  400mg  TID)  - home Clonidine decreased to 0.1mg  TID starting this evening   - Hydralazine PRN BPs >190/110 as to not drop him too low (as he's already dropped the expected amount and do not want to cause renal or cerebral hypoperfusion).  - PT/OT/SLP eval  - Urine toxicology screen - neuro checks q 2 x 12 hrs - Tylenol for pain/HA - if symptoms worsening or not improving despite improvement in BPs, low threshold to obtain MRI.   DM2: Hgb A1c 7.4 (12/2014) Home med: Lantus 14 and Novolog 4U TID with meals. Glucose 160 in ED - will start with Lantus 5 as we are not sure how often he takes this (has not been refilled since May)  - moderate SSI - monitor CBGs  CKDIII: Followed by [facility] last seen 08/2014 (with follow up in 1 mo). Per chart review BL Cr 1.9-2.2. Increase in creatinine could be secondary to hypertensive emergency. Other electrolytes normal currently.  - avoid nephrotoxic medications - will continue to monitor Cr   Anxiety:  - continue home Buspar 7.5mg  BID  FEN/GI: carb modified-renal diet  Prophylaxis: Lovenox 40mg   Disposition: admitted to [facility] for further management of Hypertensive Emergency   History of Present Illness:  [name] is a 49 y.o. male presenting with slurred speech and elevated BP 240/140 at home.   Per his mother, the patient had difficulty with word finding and could not answer questions appropriately. He was not writing as well. He has been very sleepy which is out or the ordinary for him; she took his BP which was noted to be 240/140  therefore she called EMS. Patient states he checks BP at home, and  BPs have "been lower than usual," around 160/110. The patient notes he had  a left-sided headache this morning which he is currently having. Denies chest pain, SOB, change in vision, scotoma, anxiety. He endorsed L hand cramping that has been present x 3-4 weeks and paresthesias of his bilaterally for the last several months. No new weakness or change  in sensation per his report.   The patient endorses medication compliance without missed doses however he notes he ran out of amlodipine for 2-3 days.  No recent changes in his medications.  He has been hospitalized a few years ago with elevated BPs at [facility]. No recent changes in diet. Denies alcohol, tobacco, or illegal drug use. Regarding OSA, patient states he needs to obtain a new CPAP as he was told his other machine is old- he has not been using a CPAP machine due to this.   In ED, neuro exam was noted to be normal. CT head was unremarkable. CMP was unremarkable except for Cr of 2.9 (BL 1.9-2.2). Given Hydralazine 10mg  IV once, and given afternoon dose of home Clonidine 0.2mg .    Per pharmacy, Lasix and Hydralazine were last filled in May (30 day supply), Labetalol (30 day) filled in March, Spironolactone and Clonidine filled in August (90 day supply), Amlodipine filled in September. Of note, he is also taking Buspar 7.5mg  BID for anxiety.   Per chart review in care everywhere, had recent clinic visit in 12/23/14 with FM (Dr. Precious Haws). Anti-HTNsive meds noted are Clonidine, Spironolactone, Lasix, Hydralazine. Note mentions of anxiety due to "multiple reasons including marital separation" and was started on Buspirone 7.5mg  BID.   Review Of Systems: Per HPI Otherwise 12 point review of systems was performed and was unremarkable.  Patient Active Problem List   Diagnosis Date Noted  . Hypertensive urgency 12/27/2014  . Hyponatremia 06/27/2013  . Essential hypertension, malignant 06/27/2013  . Hyperglycemia 06/26/2013  . DKA (diabetic ketoacidoses) 06/26/2013  . Severe uncontrolled hypertension 03/31/2011  . Hypokalemia 03/28/2011  . HTN (hypertension) 03/28/2011  . CKD (chronic kidney disease) stage 3, GFR 30-59 ml/min 03/28/2011   Past Medical History: Past Medical History  Diagnosis Date  . Hypertension   . Renal disorder   . Sleep apnea   . CPAP (continuous positive airway  pressure) dependence   . Diabetes mellitus without complication    Past Surgical History: Past Surgical History  Procedure Laterality Date  . No past surgeries     Social History: Social History  Substance Use Topics  . Smoking status: Never Smoker   . Smokeless tobacco: Never Used  . Alcohol Use: No   Additional social history: unemployed, lives next door to his mother Please also refer to relevant sections of EMR.  Family History: Family History  Problem Relation Age of Onset  . Hypertension Mother    Allergies and Medications: No Known Allergies No current facility-administered medications on file prior to encounter.   Current Outpatient Prescriptions on File Prior to Encounter  Medication Sig Dispense Refill  . albuterol (PROVENTIL HFA;VENTOLIN HFA) 108 (90 BASE) MCG/ACT inhaler Inhale 1-2 puffs into the lungs every 6 (six) hours as needed for wheezing or shortness of breath.    Marland Kitchen aspirin EC 81 MG tablet Take 81 mg by mouth daily.    . cloNIDine (CATAPRES) 0.2 MG tablet Take 0.2 mg by mouth 3 (three) times daily.     . furosemide (LASIX) 80 MG tablet Take 120 mg by mouth daily.     . hydrALAZINE (APRESOLINE) 50 MG tablet Take 1 tablet (50 mg total) by mouth  3 (three) times daily. 90 tablet 0  . insulin aspart (NOVOLOG) 100 UNIT/ML injection Before each meal 3 times a day, 140-199 - 2 units, 200-250 - 6 units, 251-299 - 8 units,  300-349 - 10 units,  350 or above 12 units. Dispense syringes and needles as needed, Ok to switch to PEN if approved. (Patient taking differently: Inject 4 Units into the skin 3 (three) times daily with meals. Before each meal 3 times a day, 140-199 - 2 units, 200-250 - 6 units, 251-299 - 8 units,  300-349 - 10 units,  350 or above 12 units. Dispense syringes and needles as needed, Ok to switch to PEN if approved.) 1 vial 12  . insulin glargine (LANTUS) 100 UNIT/ML injection Inject 0.35 mLs (35 Units total) into the skin daily. dispense pen if  approved, if not please dispense as needed syringes and needles - 1 month supply (Patient taking differently: Inject 14 Units into the skin daily. dispense pen if approved, if not please dispense as needed syringes and needles - 1 month supply) 10 mL 0  . labetalol (NORMODYNE) 200 MG tablet Take 400 mg by mouth 3 (three) times daily.     . Multiple Vitamin (MULTIVITAMIN WITH MINERALS) TABS tablet Take 1 tablet by mouth daily.    . potassium chloride SA (K-DUR,KLOR-CON) 20 MEQ tablet Take 20 mEq by mouth daily.     Marland Kitchen spironolactone (ALDACTONE) 25 MG tablet Take 25 mg by mouth daily.       Objective: BP 187/120 mmHg  Pulse 66  Temp(Src) 98.3 F (36.8 C) (Oral)  Resp 0  Ht 5\' 6"  (1.676 m)  Wt 254 lb 8 oz (115.44 kg)  BMI 41.10 kg/m2  SpO2 98% Exam: General: NAD, sleepy during exam and history taking Eyes: conjunctival injection, PERRL, no retinal hemorrhages or papillaedema noted on fundoscopic exam although limited exam; two beats of horizontal nystagmus with extraocular movement ENTM: normal oropharynx, dry MM Neck: normal ROM Cardiovascular: RRR, no m/r/g; no LE edema; no carotid bruit Respiratory: CTAB bilaterally although, decreased breath sounds bilaterally  Abdomen: + BS, NT, ND Skin: no rash, warm and well perfused Neuro: somnolent but answers questions appropriately. Oriented x3. CN 2-12 intact; strength 5/5 bilaterally in upper and lower extremities; sensation intact bilaterally in upper and lower ext Psych: became tearful during portion of exam (states he could not catch his breath; O2 saturations from 97-100% at that time), calmed down by his mother.   Labs and Imaging: CBC BMET   Recent Labs Lab 12/27/14 1235 12/27/14 1241  WBC 7.5  --   HGB 12.7* 14.3  HCT 37.9* 42.0  PLT 262  --     Recent Labs Lab 12/27/14 1235 12/27/14 1241  NA 138 139  K 3.9 3.8  CL 104 102  CO2 26  --   BUN 29* 31*  CREATININE 2.90* 2.70*  GLUCOSE 159* 160*  CALCIUM 9.4  --       Salicylate <4 Acetaminophen <10  Ethyl Alcohol <5   EKG: NSR, HR 69, LAD,  stable from previous. QTc 460  CT head: No intracranial abnormalities  Smiley Houseman, MD 12/27/2014, 3:37 PM PGY-1, [place] Intern pager: [phone], text pages welcome  Upper Level Addendum:  I have seen and evaluated this patient along with Dr. Delon Sacramento and reviewed the above note, making necessary revisions in purple.   Archie Patten, MD [place] Resident, PGY-2

## 2014-12-27 NOTE — ED Notes (Signed)
Attempted report to 5W. 

## 2014-12-27 NOTE — ED Notes (Addendum)
Pt from home with mother for eval of HTN and slurred speech, lsw was on 12/26/14. Per mother, pt was "not acting like himself". This morning when pt woke up mother noted slurred speech and pt unsteady on his feet. Pt lethargic in triage. Denies any pain, pt oriented.no weakness noted in triage, pt unsteady on his feet.

## 2014-12-27 NOTE — ED Provider Notes (Signed)
CSN: [id number]     Arrival date & time 12/27/14  1139 History   First MD Initiated Contact with Patient 12/27/14 1306     Chief Complaint  Patient presents with  . Hypertension  . Weakness     (Consider location/radiation/quality/duration/timing/severity/associated sxs/prior Treatment) HPI Comments: Patient presents to the ED with a chief complaint of fatigue.  Patient's mother called EMS after patient noted to be "not acting right."  Mother reports that the patient has been sleepier than normal and that the patient has had some slurred speech.  Last known normal last night.  Patient states that he only has a mild headache, but denies any other symptoms.  He states that he took his morning BP medications.    There are no aggravating or alleviating factors.  Patient denies any drug or ETOH use.  The history is provided by the patient. No language interpreter was used.    Past Medical History  Diagnosis Date  . Hypertension   . Renal disorder   . Sleep apnea   . CPAP (continuous positive airway pressure) dependence   . Diabetes mellitus without complication    Past Surgical History  Procedure Laterality Date  . No past surgeries     Family History  Problem Relation Age of Onset  . Hypertension Mother    Social History  Substance Use Topics  . Smoking status: Never Smoker   . Smokeless tobacco: Never Used  . Alcohol Use: No    Review of Systems  Constitutional: Negative for fever and chills.  Respiratory: Negative for shortness of breath.   Cardiovascular: Negative for chest pain.  Gastrointestinal: Negative for nausea, vomiting, diarrhea and constipation.  Genitourinary: Negative for dysuria.  Neurological: Positive for headaches.  All other systems reviewed and are negative.     Allergies  Review of patient's allergies indicates no known allergies.  Home Medications   Prior to Admission medications   Medication Sig Start Date End Date Taking? Authorizing  Provider  albuterol (PROVENTIL HFA;VENTOLIN HFA) 108 (90 BASE) MCG/ACT inhaler Inhale 1-2 puffs into the lungs every 6 (six) hours as needed for wheezing or shortness of breath.    Historical Provider, MD  amLODipine (NORVASC) 5 MG tablet Take 5 mg by mouth 2 (two) times daily.    Historical Provider, MD  aspirin EC 81 MG tablet Take 81 mg by mouth daily.    Historical Provider, MD  cloNIDine (CATAPRES) 0.2 MG tablet Take 0.2 mg by mouth 4 (four) times daily.     Historical Provider, MD  furosemide (LASIX) 80 MG tablet Take 120 mg by mouth daily.     Historical Provider, MD  hydrALAZINE (APRESOLINE) 50 MG tablet Take 1 tablet (50 mg total) by mouth 3 (three) times daily. 06/28/13   Thurnell Lose, MD  insulin aspart (NOVOLOG) 100 UNIT/ML injection Before each meal 3 times a day, 140-199 - 2 units, 200-250 - 6 units, 251-299 - 8 units,  300-349 - 10 units,  350 or above 12 units. Dispense syringes and needles as needed, Ok to switch to PEN if approved. Patient not taking: Reported on 08/13/2014 06/28/13   Thurnell Lose, MD  insulin glargine (LANTUS) 100 UNIT/ML injection Inject 0.35 mLs (35 Units total) into the skin daily. dispense pen if approved, if not please dispense as needed syringes and needles - 1 month supply Patient not taking: Reported on 08/13/2014 06/28/13   Thurnell Lose, MD  labetalol (NORMODYNE) 200 MG tablet Take 400 mg by  mouth 2 (two) times daily.     Historical Provider, MD  Multiple Vitamin (MULTIVITAMIN WITH MINERALS) TABS tablet Take 1 tablet by mouth daily.    Historical Provider, MD  potassium chloride SA (K-DUR,KLOR-CON) 20 MEQ tablet Take 20 mEq by mouth 2 (two) times daily.     Historical Provider, MD  spironolactone (ALDACTONE) 25 MG tablet Take 25 mg by mouth daily.     Historical Provider, MD   BP 182/118 mmHg  Pulse 72  Temp(Src) 98.3 F (36.8 C) (Oral)  Resp 17  Ht 5\' 6"  (1.676 m)  Wt 254 lb 8 oz (115.44 kg)  BMI 41.10 kg/m2  SpO2 97% Physical Exam   Constitutional: He is oriented to person, place, and time. He appears well-developed and well-nourished.  HENT:  Head: Normocephalic and atraumatic.  Right Ear: External ear normal.  Left Ear: External ear normal.  Eyes: Conjunctivae and EOM are normal. Pupils are equal, round, and reactive to light. Right eye exhibits no discharge. Left eye exhibits no discharge. No scleral icterus.  Neck: Normal range of motion. Neck supple. No JVD present.  No pain with neck flexion, no meningismus  Cardiovascular: Normal rate, regular rhythm and normal heart sounds.  Exam reveals no gallop and no friction rub.   No murmur heard. Pulmonary/Chest: Effort normal and breath sounds normal. No respiratory distress. He has no wheezes. He has no rales. He exhibits no tenderness.  Abdominal: Soft. He exhibits no distension and no mass. There is no tenderness. There is no rebound and no guarding.  Musculoskeletal: Normal range of motion. He exhibits no edema or tenderness.  Normal gait.  Neurological: He is alert and oriented to person, place, and time. He has normal reflexes.  CN 3-12 intact, normal finger to nose, no pronator drift, sensation and strength intact bilaterally.  Skin: Skin is warm and dry.  Psychiatric: He has a normal mood and affect. His behavior is normal. Judgment and thought content normal.  Nursing note and vitals reviewed.   ED Course  Procedures (including critical care time) Labs Review Labs Reviewed  CBC - Abnormal; Notable for the following:    Hemoglobin 12.7 (*)    HCT 37.9 (*)    All other components within normal limits  COMPREHENSIVE METABOLIC PANEL - Abnormal; Notable for the following:    Glucose, Bld 159 (*)    BUN 29 (*)    Creatinine, Ser 2.90 (*)    Albumin 3.4 (*)    ALT 12 (*)    GFR calc non Af Amer 24 (*)    GFR calc Af Amer 28 (*)    All other components within normal limits  CBG MONITORING, ED - Abnormal; Notable for the following:    Glucose-Capillary  164 (*)    All other components within normal limits  I-STAT CHEM 8, ED - Abnormal; Notable for the following:    BUN 31 (*)    Creatinine, Ser 2.70 (*)    Glucose, Bld 160 (*)    All other components within normal limits  PROTIME-INR  APTT  DIFFERENTIAL  I-STAT TROPOININ, ED    Imaging Review Ct Head Wo Contrast  12/27/2014   CLINICAL DATA:  Generalized weakness. Left hand numbness. Intermittent headaches, dizziness for the last week.  EXAM: CT HEAD WITHOUT CONTRAST  TECHNIQUE: Contiguous axial images were obtained from the base of the skull through the vertex without intravenous contrast.  COMPARISON:  None.  FINDINGS: No acute intracranial abnormality. Specifically, no hemorrhage, hydrocephalus, mass  lesion, acute infarction, or significant intracranial injury. No acute calvarial abnormality. Mucosal thickening in the floor the left maxillary sinus. Remainder the paranasal sinuses and mastoid air cells are clear. Orbital soft tissues unremarkable.  IMPRESSION: No intracranial abnormality.   Electronically Signed   By: Rolm Baptise M.D.   On: 12/27/2014 13:45   I have personally reviewed and evaluated these images and lab results as part of my medical decision-making.   EKG Interpretation   Date/Time:  Monday December 27 2014 12:27:13 EDT Ventricular Rate:  69 PR Interval:  184 QRS Duration: 104 QT Interval:  430 QTC Calculation: 460 R Axis:   -30 Text Interpretation:  Normal sinus rhythm Left axis deviation Incomplete  right bundle branch block Nonspecific T wave abnormality Abnormal ECG No  acute changes Confirmed by Kathrynn Humble, MD, Thelma Comp [phone]) on 12/27/2014  3:07:33 PM      MDM   Final diagnoses:  Hypertensive urgency    Patient with mild headache, HTN, and fatigue.  Reports of slurred speech and weakness.  Labs and CT ordered in triage.  Neurovascularly intact on my exam, but he patient is quite drowsy.  Will follow-up on labs and add UDS.  Will give afternoon dose of  clonidine.  Patient seen by and discussed with Dr. Kathrynn Humble, who recommends admission for hypertensive urgency.  PCP is Dr. Hulan Fray on [facility].  Patient will be unassigned.    Discussed patient with Family Practice, who will admit the patient.      Montine Circle, PA-C 12/27/14 [place], MD 12/28/14 [phone]

## 2014-12-28 ENCOUNTER — Encounter (HOSPITAL_COMMUNITY): Payer: Self-pay | Admitting: Family Medicine

## 2014-12-28 DIAGNOSIS — F410 Panic disorder [episodic paroxysmal anxiety] without agoraphobia: Secondary | ICD-10-CM

## 2014-12-28 DIAGNOSIS — E1165 Type 2 diabetes mellitus with hyperglycemia: Secondary | ICD-10-CM | POA: Diagnosis present

## 2014-12-28 DIAGNOSIS — E1129 Type 2 diabetes mellitus with other diabetic kidney complication: Secondary | ICD-10-CM

## 2014-12-28 DIAGNOSIS — E1149 Type 2 diabetes mellitus with other diabetic neurological complication: Secondary | ICD-10-CM

## 2014-12-28 DIAGNOSIS — I129 Hypertensive chronic kidney disease with stage 1 through stage 4 chronic kidney disease, or unspecified chronic kidney disease: Secondary | ICD-10-CM | POA: Diagnosis not present

## 2014-12-28 DIAGNOSIS — I674 Hypertensive encephalopathy: Secondary | ICD-10-CM

## 2014-12-28 DIAGNOSIS — Z794 Long term (current) use of insulin: Secondary | ICD-10-CM | POA: Diagnosis present

## 2014-12-28 DIAGNOSIS — E119 Type 2 diabetes mellitus without complications: Secondary | ICD-10-CM

## 2014-12-28 DIAGNOSIS — N179 Acute kidney failure, unspecified: Secondary | ICD-10-CM | POA: Diagnosis present

## 2014-12-28 DIAGNOSIS — N058 Unspecified nephritic syndrome with other morphologic changes: Secondary | ICD-10-CM

## 2014-12-28 DIAGNOSIS — F3341 Major depressive disorder, recurrent, in partial remission: Secondary | ICD-10-CM | POA: Insufficient documentation

## 2014-12-28 DIAGNOSIS — F4323 Adjustment disorder with mixed anxiety and depressed mood: Secondary | ICD-10-CM

## 2014-12-28 HISTORY — DX: Type 2 diabetes mellitus with other diabetic kidney complication: E11.29

## 2014-12-28 HISTORY — DX: Acute kidney failure, unspecified: N17.9

## 2014-12-28 HISTORY — DX: Type 2 diabetes mellitus with other diabetic neurological complication: E11.49

## 2014-12-28 HISTORY — DX: Adjustment disorder with mixed anxiety and depressed mood: F43.23

## 2014-12-28 HISTORY — DX: Hypertensive encephalopathy: I67.4

## 2014-12-28 HISTORY — DX: Panic disorder (episodic paroxysmal anxiety): F41.0

## 2014-12-28 LAB — GLUCOSE, CAPILLARY
GLUCOSE-CAPILLARY: 117 mg/dL — AB (ref 65–99)
GLUCOSE-CAPILLARY: 136 mg/dL — AB (ref 65–99)
Glucose-Capillary: 159 mg/dL — ABNORMAL HIGH (ref 65–99)
Glucose-Capillary: 166 mg/dL — ABNORMAL HIGH (ref 65–99)

## 2014-12-28 LAB — BASIC METABOLIC PANEL
Anion gap: 8 (ref 5–15)
BUN: 30 mg/dL — AB (ref 6–20)
CALCIUM: 8.8 mg/dL — AB (ref 8.9–10.3)
CHLORIDE: 103 mmol/L (ref 101–111)
CO2: 25 mmol/L (ref 22–32)
CREATININE: 2.7 mg/dL — AB (ref 0.61–1.24)
GFR calc Af Amer: 30 mL/min — ABNORMAL LOW (ref >60)
GFR calc non Af Amer: 26 mL/min — ABNORMAL LOW (ref >60)
Glucose, Bld: 142 mg/dL — ABNORMAL HIGH (ref 65–99)
Potassium: 3.5 mmol/L (ref 3.5–5.1)
SODIUM: 136 mmol/L (ref 135–145)

## 2014-12-28 LAB — PROTEIN / CREATININE RATIO, URINE
Creatinine, Urine: 302.76 mg/dL
Protein Creatinine Ratio: 0.42 mg/mg{Cre} — ABNORMAL HIGH (ref 0.00–0.15)
Total Protein, Urine: 128 mg/dL

## 2014-12-28 LAB — CBC
HCT: 37.6 % — ABNORMAL LOW (ref 39.0–52.0)
Hemoglobin: 12.8 g/dL — ABNORMAL LOW (ref 13.0–17.0)
MCH: 30.5 pg (ref 26.0–34.0)
MCHC: 34 g/dL (ref 30.0–36.0)
MCV: 89.7 fL (ref 78.0–100.0)
PLATELETS: 268 10*3/uL (ref 150–400)
RBC: 4.19 MIL/uL — ABNORMAL LOW (ref 4.22–5.81)
RDW: 13.3 % (ref 11.5–15.5)
WBC: 8.7 10*3/uL (ref 4.0–10.5)

## 2014-12-28 MED ORDER — AMLODIPINE BESYLATE 10 MG PO TABS
10.0000 mg | ORAL_TABLET | Freq: Every day | ORAL | Status: DC
  Administered 2014-12-28 – 2014-12-29 (×2): 10 mg via ORAL
  Filled 2014-12-28 (×2): qty 1

## 2014-12-28 NOTE — Progress Notes (Signed)
Patient placed on CPAP for the night. Patient tolerating well at this time.

## 2014-12-28 NOTE — Evaluation (Signed)
Clinical/Bedside Swallow Evaluation Patient Details  Name: Alwaleed Nez MRN: [id number] Date of Birth: December 24, 1965  Today's Date: 12/28/2014 Time: SLP Start Time (ACUTE ONLY): 17 SLP Stop Time (ACUTE ONLY): 1025 SLP Time Calculation (min) (ACUTE ONLY): 18 min  Past Medical History:  Past Medical History  Diagnosis Date  . Hypertension   . Renal disorder   . Sleep apnea   . CPAP (continuous positive airway pressure) dependence   . Diabetes mellitus without complication    Past Surgical History:  Past Surgical History  Procedure Laterality Date  . No past surgeries     HPI:  Prabhjot Vanaman is a 49 y.o. male presenting with slurred speech, word finding problems, and somnolence in the setting of elevated BP 240/140 consistent with hypertensive emergency. PMH is significant for DM2, uncontrolled HTN, CKDIII, OSA.    Assessment / Plan / Recommendation Clinical Impression  Bedside swallow evaluation complete. Patient lethargic but able to participate with occasional cues for initiation.  Patient with unremarkable oral motor exam, efficient mastication and oral clearance of solids.  Palpation revealed good hyolaryngeal elevation and no overt s/s of aspiration with thin liquids.  Recommend to continue with current diet orders and no further skilled SLP services are warranted at this time, SLP signing off.      Aspiration Risk  Mild    Diet Recommendation Age appropriate regular solids;Thin   Medication Administration: Whole meds with liquid Compensations: Slow rate;Small sips/bites    Other  Recommendations Oral Care Recommendations: Oral care BID   Follow Up Recommendations  None           Pertinent Vitals/Pain None     Swallow Study    General Other Pertinent Information: Verdun Moger is a 49 y.o. male presenting with slurred speech, word finding problems, and somnolence in the setting of elevated BP 240/140 consistent with hypertensive emergency. PMH is significant for  DM2, uncontrolled HTN, CKDIII, OSA.  Type of Study: Bedside swallow evaluation Previous Swallow Assessment: none on record Diet Prior to this Study: Regular;Thin liquids Temperature Spikes Noted: No Respiratory Status: Room air History of Recent Intubation: No Behavior/Cognition: Cooperative;Lethargic/Drowsy;Requires cueing Oral Cavity - Dentition: Adequate natural dentition/normal for age Self-Feeding Abilities: Able to feed self;Needs set up Patient Positioning: Upright in bed Baseline Vocal Quality: Low vocal intensity Volitional Cough: Strong Volitional Swallow: Able to elicit    Oral/Motor/Sensory Function Overall Oral Motor/Sensory Function: Appears within functional limits for tasks assessed   Ice Chips Ice chips: Within functional limits Presentation: Spoon   Thin Liquid Thin Liquid: Within functional limits Presentation: Cup;Straw;Self Fed    Nectar Thick Nectar Thick Liquid: Not tested   Honey Thick Honey Thick Liquid: Not tested   Puree Puree: Within functional limits Presentation: Self Fed;Spoon   Solid   GO    Solid: Within functional limits Presentation: Self Fed      Gunnar Fusi, M.A., CCC-SLP [phone]  [place] 12/28/2014,10:37 AM

## 2014-12-28 NOTE — Progress Notes (Signed)
Patient has rested quietly today. Minimal complaints of pain resolved and no signs of discomfort or distress noted. Nursing staff will continue to monitor. Earleen Reaper, RN

## 2014-12-28 NOTE — Evaluation (Signed)
Physical Therapy Evaluation Patient Details Name: [name] MRN: [id number] DOB: 30-Jan-1966 Today's Date: 12/28/2014   History of Present Illness  [name] is a 49 y.o. male presenting with slurred speech, word finding problems, and somnolence in the setting of elevated BP 240/140 consistent with hypertensive emergency. PMH is significant for DM2, uncontrolled HTN, CKDIII, OSA.  CT of head negative for acute abnormality.  Pt with questionabe hypertensive encephalopathy  Clinical Impression  Pt admitted with above diagnosis. Pt currently with functional limitations due to the deficits listed below (see PT Problem List). Pt was lethargic and was slow to initiate movement. MMT and functional gait presentation did not match, see details below. PT recommends the use of a cane for gait at discharge. Pt will benefit from skilled PT to increase their independence and safety with mobility to allow discharge to the venue listed below.      Follow Up Recommendations Home health PT;Supervision/Assistance - 24 hour    Equipment Recommendations  Cane    Recommendations for Other Services Speech consult (cognitive assessment)     Precautions / Restrictions Precautions Precautions: Fall      Mobility  Bed Mobility Overal bed mobility: Needs Assistance Bed Mobility: Supine to Sit     Supine to sit: Supervision     General bed mobility comments: Pt moves slowly and uses the bed railing for support for supine to sit   Transfers Overall transfer level: Needs assistance Equipment used: 2 person hand held assist Transfers: Sit to/from Stand Sit to Stand: Min guard;+2 physical assistance         General transfer comment: Pt seemed to be weight shfited to the left with increased pressure through left hand during transitions.   Ambulation/Gait Ambulation/Gait assistance: Min guard Ambulation Distance (Feet): 100 Feet Assistive device: 2 person hand held assist Gait  Pattern/deviations: Step-through pattern;Decreased weight shift to right;Decreased stance time - left Gait velocity: deceased Gait velocity interpretation: Below normal speed for age/gender General Gait Details: Pt would limp to the left during gait  Pt did not, however, have any left knee buckling or hyperextension during stance.  He did not have any noticeable foot drag or decreased clearance of left foot.  This is inconsistant with his body weight vs effort/results of MMT?     Balance Overall balance assessment: Needs assistance Sitting-balance support: Feet supported Sitting balance-Leahy Scale: Good     Standing balance support: No upper extremity supported;Single extremity supported;Bilateral upper extremity supported Standing balance-Leahy Scale: Fair                               Pertinent Vitals/Pain      Home Living Family/patient expects to be discharged to:: Private residence Living Arrangements: Alone Available Help at Discharge: Family;Available 24 hours/day (mother lives next door ) Type of Home: House Home Access: Level entry     Home Layout: One level Home Equipment: None      Prior Function Level of Independence: Independent         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, newspaper        Extremity/Trunk Assessment   Upper Extremity Assessment: Overall WFL for tasks assessed           Lower Extremity Assessment: Generalized weakness   LLE Deficits / Details: RLE strength was a 4+/5, but LLE strength presented with a 3+/5  Cervical / Trunk Assessment: Normal  Communication  Communication: No difficulties  Cognition Arousal/Alertness: Lethargic Behavior During Therapy: Flat affect Overall Cognitive Status: Impaired/Different from baseline Area of Impairment: Attention;Awareness;Problem solving   Current Attention Level: Sustained       Awareness: Emergent Problem Solving: Slow processing;Decreased initiation General  Comments: Alert and oriented to president, date, and location             Assessment/Plan    PT Assessment Patient needs continued PT services  PT Diagnosis Generalized weakness;Difficulty walking;Altered mental status;Abnormality of gait   PT Problem List Decreased strength;Decreased balance;Decreased mobility;Decreased knowledge of use of DME;Decreased activity tolerance;Decreased cognition  PT Treatment Interventions Gait training;DME instruction;Functional mobility training;Balance training;Neuromuscular re-education;Patient/family education;Therapeutic exercise;Therapeutic activities;Cognitive remediation   PT Goals (Current goals can be found in the Care Plan section) Acute Rehab PT Goals Patient Stated Goal: Pt did not state PT Goal Formulation: With patient Time For Goal Achievement: 01/11/15 Potential to Achieve Goals: Good    Frequency Min 3X/week    End of Session Equipment Utilized During Treatment: Gait belt Activity Tolerance: Patient tolerated treatment well Patient left: in chair;with chair alarm set;with call bell/phone within reach           Time: 1357-1421 PT Time Calculation (min) (ACUTE ONLY): 24 min   Charges:   PT Evaluation $Initial PT Evaluation Tier I: 1 Procedure PT Treatments $Gait Training: 8-22 mins   PT G CodesGeoffry Paradise, SPT [phone] - office 12/28/2014, 3:13 PM

## 2014-12-28 NOTE — Progress Notes (Signed)
Family Medicine Teaching Service Daily Progress Note Intern Pager: [phone]  Patient name: [name] Medical record number: [id number] Date of birth: [birth year] Age: 49 y.o. Gender: male  Primary Care Provider: Reymundo Poll, MD Consultants: none Code Status: FULL  Pt Overview and Major Events to Date:  [name] is a 49 y.o. male presenting with slurred speech, word finding problems, and somnolence in the setting of elevated BP 240/140 consistent with hypertensive emergency. PMH is significant for DM2, uncontrolled HTN, CKDIII, OSA.   Assessment and Plan: [name] is a 49 y.o. male presenting with slurred speech, word finding problems, and somnolence in the setting of elevated BP 240/140 consistent with hypertensive emergency. PMH is significant for DM2, uncontrolled HTN, CKDIII, OSA.   Hypertensive Emergency: Pt continues to be AOx4. Endorses decreased HA. Cr 2.7 down from 2.9 on admission (baseline 1.9-2.2). BPmax overnight 180/102, min 156/104. ED w/u included CT head unremarkable, EKG unremarkable, trop-i 0.05. Chart review reveals most recent nephrology appt 08/09/14 - felt poorly controlled BP 2/2 medication noncompliance, w/u of secondary HTN includes renal artery [place] 05/2012 showing RAD without stenosis, nl metanephrines, low renin, and normal aldosterone. Of note he has been sent to ED from nephrology office for hypertensive urgency in two of last three visits (2/16, 9/15). UDS negative and patient much more coherent this morning.  -holding home hydralazine 50 TID, Lasix 120mg  daily, and Aldactone 25mg  daily as we are unsure of compliance and to avoid drastic lowering of BP  -continue ASA -Labetalol 200mg  BID (decreased from home Labetalol 400mg  TID)  - home Clonidine decreased to 0.1mg  TID  - Hydralazine PRN BPs >190/110 as to not drop him too low - PT/OT/SLP eval  - neuro checks q 2 x 12 hrs - Tylenol for pain/HA (none required overnight) - if symptoms worsening or not  improving despite improvement in BPs, low threshold to obtain MRI.    DM2: Hgb A1c 7.4 (12/2014) Home med: Lantus 14 and Novolog 4U TID with meals. Glucose 160 in ED, 141 and 117 overnight. - will start with Lantus 5 as we are not sure how often he takes this. Per home pharmacy no refills on lantus since May, however pt endorses receiving samples last appt (can't recall name of doctor or specialty). - moderate SSI - monitor CBGs  CKDIII: Followed by [facility] last seen 08/2014 (with follow up in 1 mo). Per chart review BL Cr 1.9-2.2. Increase in creatinine could be secondary to hypertensive emergency. Other electrolytes normal currently.  - avoid nephrotoxic medications - will continue to monitor Cr   Anxiety:  - continue home Buspar 7.5mg  BID  FEN/GI: carb modified-renal diet  Prophylaxis: Lovenox 40mg   Disposition: Continue BP management on inpatient family medicine service.  Subjective:  [name] endorses decreased headache today and is much more conversant. He is oriented to person, place, time, and his condition. He was using CPAP this morning. Denies CP, SOB. Suggests that his wife and mother keep up with his appointments. He reports that he had been using his father's CPAP at home for some time, but had d/c'd because it was too old. He feels he could use a CPAP at home if we wrote him a new prescription to get his own.  Objective: Temp:  [97.8 F (36.6 C)-98.3 F (36.8 C)] 97.9 F (36.6 C) (09/20 0509) Pulse Rate:  [66-75] 69 (09/20 0509) Resp:  [15-21] 18 (09/20 0509) BP: (156-188)/(102-122) 156/104 mmHg (09/20 0509) SpO2:  [97 %-100 %] 98 % (09/20  [id number]) Weight:  [248 lb 6.4 oz (112.674 kg)-254 lb 8 oz (115.44 kg)] 251 lb 5.2 oz (114 kg) (09/20 0509) Physical Exam: General: Pt AOx4 in NAD. Cardiovascular: RRR, no m/r/g; no LE edema; no carotid bruit Respiratory: CTAB, no wheezes, rhonchi, or crackles Abdomen: nontender, nondistended, bowel sounds present   Extremities: no LE edema, rashes, or wounds  Laboratory:  Recent Labs Lab 12/27/14 1235 12/27/14 1241 12/28/14 0533  WBC 7.5  --  8.7  HGB 12.7* 14.3 12.8*  HCT 37.9* 42.0 37.6*  PLT 262  --  268    Recent Labs Lab 12/27/14 1235 12/27/14 1241 12/28/14 0533  NA 138 139 136  K 3.9 3.8 3.5  CL 104 102 103  CO2 26  --  25  BUN 29* 31* 30*  CREATININE 2.90* 2.70* 2.70*  CALCIUM 9.4  --  8.8*  PROT 7.1  --   --   BILITOT 0.7  --   --   ALKPHOS 65  --   --   ALT 12*  --   --   AST 17  --   --   GLUCOSE 159* 160* 142*    UDS negative for opiates, cocaine, benzodiazepines, amphetamines, THC, and barbiturates.  Imaging/Diagnostic Tests: CT head: no intracranial abnormality  [name], Med Student 12/28/2014, 7:30 AM Medical Student, [place] Intern pager: [phone], text pages welcome  RESIDENT ADDENDUM  I have separately seen and examined the patient. I have discussed the findings and exam with the medical student and agree with the above note, which I have edited appropriately. I helped develop the management plan that is described in the student's note, and I agree with the content.  Additionally I have outlined my exam and assessment/plan below:   PE:  Blood pressure 156/104, pulse 69, temperature 97.9 F (36.6 C), temperature source Oral, resp. rate 18, height 5' 6.5" (1.689 m), weight 251 lb 5.2 oz (114 kg), SpO2 98 %. Sitting up in bed, more awake this AM  Continues to have some conjunctival injection but improved Moist mucous membranes. RRR, no m/r/g noted. No pitting edema of the LE  Lungs CTAB without wheezing, rhonchi, or crackles. Improved air movement this AM Abdomen: +BS, soft, NT/ND  A/P:  Hypertensive emergency: BPs improved from 220/140s to 150s/100s, approximately a 25-30% drop which was goal. SCr still elevated from baseline (2.7) however improved from 2.9 on admission. Pheochromocytoma, hyperaldosteronism, and RAS  have been ruled out by outpatient nephrology. UDS negative. Etiology most likely from non-compliance given we have him on less than half his home antihypertensives. Continue labetalol 200mg  BID, clonidine 0.1mg  TID, and hydralazine PRN. Ideally would add back losartan for renal protection in the future, but will hold off for now given bump in creatinine.  - f/t PT and OT recs  - continue ASA - low threshold for MRI if acutely worsens  DM: continue decreased dose of Lantus 5 units daily with moderate SSI.    Archie Patten, MD PGY-2,  [place] [facility] 12/28/2014  9:39 AM

## 2014-12-28 NOTE — Care Management Note (Addendum)
Case Management Note  Patient Details  Name: Damieon Sherwin MRN: [id number] Date of Birth: [birth year]  Subjective/Objective:    Date: 12/28/14 Spoke with patient at the bedside. Introduced self as Tourist information centre manager and explained role in discharge planning and how to be reached. Verified patient lives in town, alone, has no  DME. Expressed potential need for cane and cpap and tub bench. Verified patient anticipates to go home alone, at time of discharge and will have part-time supervision by family friends neighbors at this time to best of their knowledge. Patient  denied needing help with their medication. Patient is driven by mom to MD appointments. Verified patient has PCP .  Patient will need to get a new sleep study scheduled for a new cpap.  NCM will leave this on the chart for MD to sign.  Will need order in for cpap with settings from MD.   Patient chose [facility] for HHPT /OT.  Referral made to [facility].  Soc will begin 24-48 hrs post dc.    Plan: CM will continue to follow for discharge planning and [facility] resources.                 Action/Plan:   Expected Discharge Date:                  Expected Discharge Plan:  [place]  In-House Referral:     Discharge planning Services  CM Consult  Post Acute Care Choice:  Home Health, Durable Medical Equipment Choice offered to:  Patient  DME Arranged:  [name] DME Agency:     HH Arranged:    [place] Agency:     Status of Service:  In process, will continue to follow  Medicare Important Message Given:    Date Medicare IM Given:    Medicare IM give by:    Date Additional Medicare IM Given:    Additional Medicare Important Message give by:     If discussed at [place] of Stay Meetings, dates discussed:    Additional Comments:  Zenon Mayo, RN 12/28/2014, 4:11 PM

## 2014-12-28 NOTE — Evaluation (Signed)
Occupational Therapy Evaluation Patient Details Name: Xao Byas MRN: [id number] DOB: [birth year] Today's Date: 12/28/2014    History of Present Illness Hakan Delap is a 49 y.o. male presenting with slurred speech, word finding problems, and somnolence in the setting of elevated BP 240/140 consistent with hypertensive emergency. PMH is significant for DM2, uncontrolled HTN, CKDIII, OSA.  CT of head negative for acute abnormality.  Pt with questionabe hypertensive encephalopathy   Clinical Impression   Pt admitted with above. He demonstrates the below listed deficits and will benefit from continued OT to maximize safety and independence with BADLs.  Pt presents to OT with flat affect, cognitive deficits including delayed processing and decreased initiation as well as impaired balance.  He requires min guard assist for ADLs.  Mother is able to provide 24 hour supervision at discharge.  Will follow acutely.       Follow Up Recommendations  Home health OT;Supervision/Assistance - 24 hour    Equipment Recommendations  Tub/shower bench    Recommendations for Other Services       Precautions / Restrictions Precautions Precautions: Fall Restrictions Weight Bearing Restrictions: No      Mobility Bed Mobility Overal bed mobility: Needs Assistance Bed Mobility: Supine to Sit;Sit to Supine     Supine to sit: Supervision Sit to supine: Supervision   General bed mobility comments: Pt moves slowly   Transfers Overall transfer level: Needs assistance   Transfers: Sit to/from Stand;Stand Pivot Transfers Sit to Stand: Min guard Stand pivot transfers: Min guard            Balance Overall balance assessment: Needs assistance Sitting-balance support: Feet supported Sitting balance-Leahy Scale: Good     Standing balance support: During functional activity;No upper extremity supported Standing balance-Leahy Scale: Fair                              ADL Overall  ADL's : Needs assistance/impaired Eating/Feeding: Independent   Grooming: Wash/dry hands;Wash/dry face;Oral care;Brushing hair;Min guard;Standing   Upper Body Bathing: Set up;Supervision/ safety;Sitting   Lower Body Bathing: Min guard;Sit to/from stand   Upper Body Dressing : Set up;Sitting   Lower Body Dressing: Min guard;Sit to/from stand   Toilet Transfer: Min guard;Ambulation;Comfort height toilet;RW   Toileting- Water quality scientist and Hygiene: Min guard;Sit to/from stand       Functional mobility during ADLs: Min guard General ADL Comments: Pt requires prompting to initiate simple activity  He requires min guard assist due to impaired balance.       Vision Vision Assessment?: Yes Eye Alignment: Within Functional Limits Ocular Range of Motion: Within Functional Limits Alignment/Gaze Preference: Within Defined Limits Tracking/Visual Pursuits: Able to track stimulus in all quads without difficulty Visual Fields: No apparent deficits Additional Comments: Pt reports he recently had glasses prescription changed due to changes from diabetes - pt unable to elaborate    Perception Perception Perception Tested?: Yes   Praxis Praxis Praxis tested?: Within functional limits    Pertinent Vitals/Pain Pain Assessment: No/denies pain Pain Score: 5  Pain Location: head and neck Pain Descriptors / Indicators: Aching Pain Intervention(s): Limited activity within patient's tolerance;Monitored during session;Premedicated before session     Hand Dominance Right   Extremity/Trunk Assessment Upper Extremity Assessment Upper Extremity Assessment: Overall WFL for tasks assessed   Lower Extremity Assessment Lower Extremity Assessment: Defer to PT evaluation   Cervical / Trunk Assessment Cervical / Trunk Assessment: Normal   Communication Communication Communication: No difficulties  Cognition Arousal/Alertness: Lethargic Behavior During Therapy: Flat affect Overall  Cognitive Status: Impaired/Different from baseline Area of Impairment: Attention;Awareness;Problem solving   Current Attention Level: Sustained       Awareness: Intellectual Problem Solving: Slow processing;Decreased initiation General Comments: Pt was able to perform serial subtraction - backwards from 100 by 2's.  Pt's mother reports pt is typically very exuberant and outgoing    General Comments       Exercises       Shoulder Instructions      Home Living Family/patient expects to be discharged to:: Private residence Living Arrangements: Alone Available Help at Discharge: Family;Available 24 hours/day (mother lives next door ) Type of Home: House Home Access: Level entry     Home Layout: One level     Bathroom Shower/Tub: Tub/shower unit;Curtain Shower/tub characteristics: Architectural technologist: Standard     Home Equipment: None          Prior Functioning/Environment Level of Independence: Independent        Comments: Pt does not work     OT Diagnosis: Generalized weakness;Cognitive deficits   OT Problem List: Decreased strength;Decreased activity tolerance;Impaired balance (sitting and/or standing);Decreased cognition;Decreased safety awareness   OT Treatment/Interventions: Self-care/ADL training;DME and/or AE instruction;Therapeutic activities;Cognitive remediation/compensation;Patient/family education;Balance training    OT Goals(Current goals can be found in the care plan section) Acute Rehab OT Goals Patient Stated Goal: Pt did not state OT Goal Formulation: With patient/family Time For Goal Achievement: 01/11/15 Potential to Achieve Goals: Good ADL Goals Pt Will Perform Grooming: with modified independence;standing Pt Will Perform Upper Body Bathing: with modified independence;sitting;standing Pt Will Perform Lower Body Bathing: with modified independence;sit to/from stand Pt Will Perform Upper Body Dressing: with modified  independence;sitting;standing Pt Will Perform Lower Body Dressing: with modified independence;sit to/from stand Pt Will Transfer to Toilet: with modified independence;ambulating;regular height toilet;bedside commode;grab bars Pt Will Perform Toileting - Clothing Manipulation and hygiene: with modified independence;sit to/from stand Pt Will Perform Tub/Shower Transfer: Tub transfer;with supervision;ambulating;tub bench  OT Frequency: Min 2X/week   Barriers to D/C:            Co-evaluation              End of Session Nurse Communication: Mobility status  Activity Tolerance: Patient limited by lethargy Patient left: in bed;with call bell/phone within reach;with bed alarm set;with family/visitor present   Time: [id number] OT Time Calculation (min): 26 min Charges:  OT General Charges $OT Visit: 1 Procedure OT Evaluation $Initial OT Evaluation Tier I: 1 Procedure OT Treatments $Self Care/Home Management : 8-22 mins G-Codes:    Conarpe, Wendi M 01-02-2015, 12:02 PM

## 2014-12-28 NOTE — Progress Notes (Signed)
Pharmacist Provided - Patient Medication Education    [name] is an 49 y.o. male who presented to [facility] on 12/27/2014 with a chief complaint of  Chief Complaint  Patient presents with  . Hypertension  . Weakness      The following medications were discussed with the patient: Hypertension medications    Allergy Assessment Completed and Updated: [x]  Yes    []  No Identified Patient Allergies: No Known Allergies   Medication Adherence Assessment: []  Excellent (no doses missed/week)      []  Good (1 dose missed/week)      [x]  Partial (2-3 doses missed/week)      []  Poor (>3 doses missed/week)  Barriers to Obtaining Medications: []  Yes [x]  No     Assessment: Patient states that he has good adherence most of the time but sometimes misses his mid-day dose of his TID medications due to falling asleep during the day.  Pt believes the drowsiness is caused by his medications. Pt reports that he uses a pill box to remember his medications and does not have any barriers to obtaining medications.  Talked with pt about using smartphone apps or alarms to help him remember his medications and not miss doses.  Pt reports no side effects to any of his home medications other than drowsiness.   Time spent preparing for medication counseling: 5 mins Time spent counseling patient: [place], PharmD Pharmacy Resident [phone]

## 2014-12-29 ENCOUNTER — Other Ambulatory Visit: Payer: Self-pay | Admitting: Internal Medicine

## 2014-12-29 ENCOUNTER — Inpatient Hospital Stay (HOSPITAL_COMMUNITY): Payer: Medicare Other

## 2014-12-29 LAB — BASIC METABOLIC PANEL
Anion gap: 7 (ref 5–15)
BUN: 32 mg/dL — ABNORMAL HIGH (ref 6–20)
CALCIUM: 8.9 mg/dL (ref 8.9–10.3)
CO2: 26 mmol/L (ref 22–32)
CREATININE: 2.81 mg/dL — AB (ref 0.61–1.24)
Chloride: 104 mmol/L (ref 101–111)
GFR calc Af Amer: 29 mL/min — ABNORMAL LOW (ref >60)
GFR, EST NON AFRICAN AMERICAN: 25 mL/min — AB (ref >60)
Glucose, Bld: 167 mg/dL — ABNORMAL HIGH (ref 65–99)
POTASSIUM: 3.7 mmol/L (ref 3.5–5.1)
Sodium: 137 mmol/L (ref 135–145)

## 2014-12-29 LAB — TSH: TSH: 1.06 u[IU]/mL (ref 0.350–4.500)

## 2014-12-29 LAB — GLUCOSE, CAPILLARY
GLUCOSE-CAPILLARY: 126 mg/dL — AB (ref 65–99)
GLUCOSE-CAPILLARY: 148 mg/dL — AB (ref 65–99)
GLUCOSE-CAPILLARY: 128 mg/dL — AB (ref 65–99)

## 2014-12-29 LAB — MAGNESIUM: MAGNESIUM: 2.3 mg/dL (ref 1.7–2.4)

## 2014-12-29 LAB — PHOSPHORUS: Phosphorus: 4.1 mg/dL (ref 2.5–4.6)

## 2014-12-29 MED ORDER — ACETAMINOPHEN 325 MG PO TABS
650.0000 mg | ORAL_TABLET | ORAL | Status: DC | PRN
  Administered 2014-12-29: 650 mg via ORAL
  Filled 2014-12-29: qty 2

## 2014-12-29 MED ORDER — SERTRALINE HCL 50 MG PO TABS
50.0000 mg | ORAL_TABLET | Freq: Every day | ORAL | Status: DC
  Administered 2014-12-29: 50 mg via ORAL
  Filled 2014-12-29: qty 1

## 2014-12-29 MED ORDER — HYDRALAZINE HCL 10 MG PO TABS: 10.0000 mg | ORAL_TABLET | Freq: Three times a day (TID) | ORAL | Status: DC

## 2014-12-29 MED ORDER — CLONIDINE HCL 0.1 MG PO TABS: 0.1000 mg | ORAL_TABLET | Freq: Three times a day (TID) | ORAL | Status: DC

## 2014-12-29 MED ORDER — SERTRALINE HCL 50 MG PO TABS: 50.0000 mg | ORAL_TABLET | Freq: Every day | ORAL | Status: DC

## 2014-12-29 MED ORDER — LABETALOL HCL 200 MG PO TABS: 200.0000 mg | ORAL_TABLET | Freq: Two times a day (BID) | ORAL | Status: DC

## 2014-12-29 MED ORDER — AMLODIPINE BESYLATE 10 MG PO TABS: 10.0000 mg | ORAL_TABLET | Freq: Every day | ORAL | Status: DC

## 2014-12-29 MED ORDER — INSULIN GLARGINE 100 UNIT/ML ~~LOC~~ SOLN: 6.0000 [IU] | Freq: Every day | SUBCUTANEOUS | Status: DC

## 2014-12-29 MED ORDER — HYDRALAZINE HCL 10 MG PO TABS
10.0000 mg | ORAL_TABLET | Freq: Three times a day (TID) | ORAL | Status: DC
  Administered 2014-12-29: 10 mg via ORAL
  Filled 2014-12-29: qty 1

## 2014-12-29 MED ORDER — ACETAMINOPHEN 650 MG RE SUPP: 650.0000 mg | Freq: Four times a day (QID) | RECTAL | Status: DC | PRN

## 2014-12-29 MED ORDER — POLYETHYLENE GLYCOL 3350 17 G PO PACK
17.0000 g | PACK | Freq: Every day | ORAL | Status: DC
  Administered 2014-12-29: 17 g via ORAL
  Filled 2014-12-29: qty 1

## 2014-12-29 MED ORDER — INSULIN GLARGINE 100 UNIT/ML ~~LOC~~ SOLN
6.0000 [IU] | Freq: Every day | SUBCUTANEOUS | Status: DC
  Administered 2014-12-29: 6 [IU] via SUBCUTANEOUS
  Filled 2014-12-29 (×2): qty 0.06

## 2014-12-29 NOTE — Progress Notes (Signed)
Family Medicine Teaching Service Daily Progress Note Intern Pager: [phone]  Patient name: [name] Medical record number: [id number] Date of birth: 18-Feb-1966 Age: 49 y.o. Gender: male  Primary Care Provider: Reymundo Poll, MD Consultants: none Code Status: FULL  Pt Overview and Major Events to Date:  No acute events overnight.  Assessment and Plan: [name] is a 49 y.o. male presenting with slurred speech, word finding problems, and somnolence in the setting of elevated BP 240/140 consistent with hypertensive emergency. PMH is significant for DM2, uncontrolled HTN, CKDIII, OSA.   Hypertensive Emergency: Pt continues to be AOx4. Endorses decreased HA. Cr holding steady at 2.7 down from 2.9 on admission (baseline 1.9-2.2). BPmax overnight 179/112, min 147/87. ED w/u included CT head unremarkable, EKG unremarkable, trop-i 0.05. Chart review reveals most recent nephrology appt 08/09/14 - felt poorly controlled BP 2/2 medication noncompliance, w/u of secondary HTN includes renal artery [place] 05/2012 showing RAD without stenosis, nl metanephrines, low renin, and normal aldosterone. Of note he has been sent to ED from nephrology office for hypertensive urgency in two of last three visits (2/16, 9/15). UDS negative and patient much more coherent this morning.  - holding home hydralazine 50 TID, Lasix 120mg  daily, and Aldactone 25mg  daily as we are unsure of compliance and to avoid drastic lowering of BP  - added amlodipine 10mg   - continue ASA - Labetalol 200mg  BID (decreased from home Labetalol 400mg  TID)  - home Clonidine decreased to 0.1mg  TID  - Hydralazine PRN BPs >190/110 as to not drop him too low (given once overnight 0600, subsequent BP 147/87) - PT/OT/SLP eval  - neuro checks q 2 x 12 hrs - Tylenol for pain/HA (required once overnight) - if symptoms worsening or not improving despite improvement in BPs, low threshold to obtain MRI.    DM2: Hgb A1c 7.4 (12/2014) Home med: Lantus  14 and Novolog 4U TID with meals. Glucose 160 in ED, 141 and 117 overnight. - will start with Lantus 5 as we are not sure how often he takes this. Per home pharmacy no refills on lantus since May, however pt endorses receiving samples last appt (can't recall name of doctor or specialty). - moderate SSI - monitor CBGs - required 3 units novolog on sliding scale  CKDIII: Followed by [facility]rology last seen 08/2014 (with follow up in 1 mo). Per chart review BL Cr 1.9-2.2. Increase in creatinine could be secondary to hypertensive emergency. Other electrolytes normal currently. Protein Cr ratio suggests macroalbuminuria, which would typically indicate adding an ACEI or ARB. However, will defer to [facility] nephrology who is managing. - avoid nephrotoxic medications - will continue to monitor Cr  - protein Cr ratio elevated at 0.42   Anxiety: Possibly panic disorder, pt experienced marital infidelity in the past - home Buspar dose is 7.5mg  BID, consider increase to 10mg  BID - consider adding sertraline 50mg  QD for one week, to titrate to 100mg  pending f/u and tolerance and adherence  FEN/GI: carb modified-renal diet  Prophylaxis: Lovenox 40mg   Disposition: Discharge today  Subjective:  Pt feels well this morning, continues to endorse HA. Instructed to ask for Tylenol PRN. He does endorse some anxiety at home and would be amenable to adding an extra medication for this. Mom suggests that he is not at his baseline affect ("he's normally outgoing and cracking jokes"). She suggests that the current flat affect and poor effort is new in the past few days coinciding with elevated BP. Pt denies any bowel movements since admission,  added miralax today.   Objective: Temp:  [97.8 F (36.6 C)-98.4 F (36.9 C)] 98.4 F (36.9 C) (09/20 2124) Pulse Rate:  [67-73] 69 (09/21 0647) Resp:  [16-20] 20 (09/21 0535) BP: (147-179)/(87-112) 147/87 mmHg (09/21 0647) SpO2:  [96 %-100 %] 96 % (09/21 0535) Weight:  [248  lb 14.4 oz (112.9 kg)] 248 lb 14.4 oz (112.9 kg) (09/21 0535) Physical Exam: General: Pt AOx4 in NAD. Cardiovascular: RRR, no m/r/g; no LE edema; no carotid bruit Respiratory: CTAB, no wheezes, rhonchi, or crackles Abdomen: nontender, nondistended, bowel sounds present  Extremities: no LE edema, rashes, or wounds  Laboratory:  Recent Labs Lab 12/27/14 1235 12/27/14 1241 12/28/14 0533  WBC 7.5  --  8.7  HGB 12.7* 14.3 12.8*  HCT 37.9* 42.0 37.6*  PLT 262  --  268    Recent Labs Lab 12/27/14 1235 12/27/14 1241 12/28/14 0533  NA 138 139 136  K 3.9 3.8 3.5  CL 104 102 103  CO2 26  --  25  BUN 29* 31* 30*  CREATININE 2.90* 2.70* 2.70*  CALCIUM 9.4  --  8.8*  PROT 7.1  --   --   BILITOT 0.7  --   --   ALKPHOS 65  --   --   ALT 12*  --   --   AST 17  --   --   GLUCOSE 159* 160* 142*    TSH nl Protein Creatinine Ratio 0.42 consistent with macroalbuminemia   Sela Hilding, Med Student 12/29/2014, 8:01 AM Medical Student (pager [phone]), [place] Intern pager: [phone], text pages welcome  RESIDENT ADDENDUM  I have separately seen and examined the patient. I have discussed the findings and exam with the medical student and agree with the above note, which I have edited appropriately. I helped develop the management plan that is described in the student's note, and I agree with the content.  Additionally I have outlined my exam and assessment/plan below:   Patient doing well. Has a frontal headache, just took Tylenol. No chest pain, abdominal pain, change in vision, or problems with urination.  PE:  Blood pressure 147/87, pulse 69, temperature 98.4 F (36.9 C), temperature source Oral, resp. rate 20, height 5' 6.5" (1.689 m), weight 248 lb 14.4 oz (112.9 kg), SpO2 96 %. Sitting in bed in NAD, eating breakfast RRR, no m/r/g noted. No pitting edema  Lungs CTAB without wheezing, rhonchi, or crackles Blunted affect. Stated mood is "ok."  Slow to answer questions, soft spoken.  A/P:  Hypertensive emergency: continue labetalol 200mg  BID, clonidine 0.1mg  TID, and norvasc 10mg  (that was added yesterday). Has had a dose of PRN hydralazine. Would prefer not increase clonidine and I am not sure his HRs could tolerate an increased in labetalol.  Would like to watch him with addition of amlodipine.   DM: increased Lantus from 5 to 6 units this AM (as he had 6 units SSI)  CKD III with AKI vs CKD IV: repeat BMET pending. Will need f/u with [facility] nephro  Depression: Flat affect. Will increase Buspar and add SSRI inpatient vs recommend as outpatient. Mother feels he needs counseling. Notes he used to joke around and that she feel his depression has changed his personality.   Archie Patten, MD PGY-1,  [facility] 12/29/2014  9:49 AM

## 2014-12-29 NOTE — Care Management Note (Addendum)
Case Management Note  Patient Details  Name: [name] MRN: [id number] Date of Birth: 01-20-1966  Subjective/Objective:      NCM faxed sleep study over to sleep lab, they have scheduled patient for 01/12/15, they will mail forms to him to fill out. Patient will also have a cane to go home with at dc and [name] services for PT/OT with [facility].  Per RN , patient is on new Blood pressure meds, but has medicare which he states he has no problems getting his medications. Patient has transport home at dc as well.  NCM will cont to follow for  Dc needs.  Per [name] with [facility] patient owes ,  NCM faxed information to [place] for cpap. Patient is scheduled for sleep study.              Action/Plan:   Expected Discharge Date:                  Expected Discharge Plan:  [place]  In-House Referral:     Discharge planning Services  CM Consult  Post Acute Care Choice:  Home Health, Durable Medical Equipment Choice offered to:  Patient  DME Arranged:  Cane, Continuous positive airway pressure (CPAP) DME Agency:  [place] Arranged:  PT, OT [place] Agency:  [facility]  Status of Service:  Completed, signed off  Medicare Important Message Given:    Date Medicare IM Given:    Medicare IM give by:    Date Additional Medicare IM Given:    Additional Medicare Important Message give by:     If discussed at [place] of Stay Meetings, dates discussed:    Additional Comments:  Zenon Mayo, RN 12/29/2014, 12:19 PM

## 2014-12-29 NOTE — Progress Notes (Signed)
PT Cancellation Note  Patient Details Name: Ruthford Radigan MRN: [id number] DOB: May 02, 1965   Cancelled Treatment:    Reason Eval/Treat Not Completed: Patient at procedure or test/unavailable.  Per person in room, pt is at CT scan.  PT to check back later today or tomorrow.  Thanks,  Barbarann Ehlers. [place], [place], DPT [phone]   12/29/2014, 2:45 PM

## 2014-12-29 NOTE — Progress Notes (Signed)
[name] discharged Home with mother & H/H PT/OT per MD order.  Discharge instructions reviewed and discussed with the patient & mother, all questions and concerns answered. Copy of instructions and care notes for new medication & diagnosis given to patient.    Medication List    STOP taking these medications        furosemide 80 MG tablet  Commonly known as:  LASIX     potassium chloride SA 20 MEQ tablet  Commonly known as:  K-DUR,KLOR-CON     spironolactone 25 MG tablet  Commonly known as:  ALDACTONE      TAKE these medications        albuterol 108 (90 BASE) MCG/ACT inhaler  Commonly known as:  PROVENTIL HFA;VENTOLIN HFA  Inhale 1-2 puffs into the lungs every 6 (six) hours as needed for wheezing or shortness of breath.     amLODipine 10 MG tablet  Commonly known as:  NORVASC  Take 1 tablet (10 mg total) by mouth daily.     aspirin EC 81 MG tablet  Take 81 mg by mouth daily.     busPIRone 7.5 MG tablet  Commonly known as:  BUSPAR  Take 7.5 mg by mouth 2 (two) times daily.     cloNIDine 0.1 MG tablet  Commonly known as:  CATAPRES  Take 1 tablet (0.1 mg total) by mouth 3 (three) times daily.     hydrALAZINE 10 MG tablet  Commonly known as:  APRESOLINE  Take 1 tablet (10 mg total) by mouth 3 (three) times daily.     insulin aspart 100 UNIT/ML injection  Commonly known as:  NOVOLOG  Before each meal 3 times a day, 140-199 - 2 units, 200-250 - 6 units, 251-299 - 8 units,  300-349 - 10 units,  350 or above 12 units. Dispense syringes and needles as needed, Ok to switch to PEN if approved.     insulin glargine 100 UNIT/ML injection  Commonly known as:  LANTUS  Inject 0.06 mLs (6 Units total) into the skin daily.     labetalol 200 MG tablet  Commonly known as:  NORMODYNE  Take 1 tablet (200 mg total) by mouth 2 (two) times daily.     multivitamin with minerals Tabs tablet  Take 1 tablet by mouth daily.     sertraline 50 MG tablet  Commonly known as:  ZOLOFT   Take 1 tablet (50 mg total) by mouth daily.        Patients skin is clean, dry and intact, no evidence of skin break down. IV site discontinued and catheter remains intact. Site without signs and symptoms of complications. Dressing and pressure applied.  Patient escorted to car by NT in a wheelchair,  no distress noted upon discharge.  [name], [name] 12/29/2014 7:27 PM

## 2014-12-29 NOTE — Discharge Instructions (Signed)
Search for [email or web address] for psychology counselors in your zipcode who will be covered by Medicare Please take all of your medications as listed on discharge summary. Of note, we have decreased your LANTUS to 6 units.  Contact your PCP if your BP is more than 180/110.   Hypertension Hypertension, commonly called high blood pressure, is when the force of blood pumping through your arteries is too strong. Your arteries are the blood vessels that carry blood from your heart throughout your body. A blood pressure reading consists of a higher number over a lower number, such as 110/72. The higher number (systolic) is the pressure inside your arteries when your heart pumps. The lower number (diastolic) is the pressure inside your arteries when your heart relaxes. Ideally you want your blood pressure below 120/80. Hypertension forces your heart to work harder to pump blood. Your arteries may become narrow or stiff. Having hypertension puts you at risk for heart disease, stroke, and other problems.  RISK FACTORS Some risk factors for high blood pressure are controllable. Others are not.  Risk factors you cannot control include:   Race. You may be at higher risk if you are African American.  Age. Risk increases with age.  Gender. Men are at higher risk than women before age 42 years. After age 52, women are at higher risk than men. Risk factors you can control include:  Not getting enough exercise or physical activity.  Being overweight.  Getting too much fat, sugar, calories, or salt in your diet.  Drinking too much alcohol. SIGNS AND SYMPTOMS Hypertension does not usually cause signs or symptoms. Extremely high blood pressure (hypertensive crisis) may cause headache, anxiety, shortness of breath, and nosebleed. DIAGNOSIS  To check if you have hypertension, your health care provider will measure your blood pressure while you are seated, with your arm held at the level of your  heart. It should be measured at least twice using the same arm. Certain conditions can cause a difference in blood pressure between your right and left arms. A blood pressure reading that is higher than normal on one occasion does not mean that you need treatment. If one blood pressure reading is high, ask your health care provider about having it checked again. TREATMENT  Treating high blood pressure includes making lifestyle changes and possibly taking medicine. Living a healthy lifestyle can help lower high blood pressure. You may need to change some of your habits. Lifestyle changes may include:  Following the DASH diet. This diet is high in fruits, vegetables, and whole grains. It is low in salt, red meat, and added sugars.  Getting at least 2 hours of brisk physical activity every week.  Losing weight if necessary.  Not smoking.  Limiting alcoholic beverages.  Learning ways to reduce stress. If lifestyle changes are not enough to get your blood pressure under control, your health care provider may prescribe medicine. You may need to take more than one. Work closely with your health care provider to understand the risks and benefits. HOME CARE INSTRUCTIONS  Have your blood pressure rechecked as directed by your health care provider.   Take medicines only as directed by your health care provider. Follow the directions carefully. Blood pressure medicines must be taken as prescribed. The medicine does not work as well when you skip doses. Skipping doses also puts you at risk for problems.   Do not smoke.   Monitor your blood pressure at home as directed by your health care provider. [place]  CARE IF:   You think you are having a reaction to medicines taken.  You have recurrent headaches or feel dizzy.  You have swelling in your ankles.  You have trouble with your vision. SEEK IMMEDIATE MEDICAL CARE IF:  You develop a severe headache or confusion.  You have unusual  weakness, numbness, or feel faint.  You have severe chest or abdominal pain.  You vomit repeatedly.  You have trouble breathing. MAKE SURE YOU:   Understand these instructions.  Will watch your condition.  Will get help right away if you are not doing well or get worse. Document Released: 03/26/2005 Document Revised: 08/10/2013 Document Reviewed: 01/16/2013 [facility] Patient Information 2015 [place], [place]. This information is not intended to replace advice given to you by your health care provider. Make sure you discuss any questions you have with your health care provider.

## 2014-12-29 NOTE — Discharge Summary (Signed)
[facility] Discharge Summary  Patient name: [name] Medical record number: [id number] Date of birth: [birth year] Age: 49 y.o. Gender: male Date of Admission: 12/27/2014  Date of Discharge: 12/30/2014  Admitting Physician: Blane Ohara McDiarmid, MD  Primary Care Provider: Precious Haws MD [place] Consultants: none  Indication for Hospitalization: Hypertensive emergency   Discharge Diagnoses/Problem List:  Hypertensive Emergency  DM2  CKDIII Anxiety/Depression   Disposition: home with HHPT  Discharge Condition: good  Discharge Exam: General: Pt AOx4 in NAD. Cardiovascular: RRR, no m/r/g; no LE edema; no carotid bruit Respiratory: CTAB, no wheezes, rhonchi, or crackles Abdomen: nontender, nondistended, bowel sounds present  Extremities: no LE edema, rashes, or wounds  Brief Hospital Course: Pt admitted 2/2 slurred speech, word finding difficulty, and HA along with BP to 240/120.  Hypertensive Emergency: Pt presented with BP 240/140 with AMS, slurred speech, and word finding problems. ED w/u included CT head unremarkable, EKG unremarkable, trop-i 0.05.  BP responded to hydralazine 10mg  IV, reducing to 160s-180s/110s in the ED. Chart review reveals most recent nephrology appt 08/09/14 - felt poorly controlled BP 2/2 medication noncompliance, w/u of secondary HTN includes renal artery [place] 05/2012 showing RAD without stenosis, nl metanephrines, low renin, and normal aldosterone. Of note he has been sent to ED from nephrology office for hypertensive urgency in two of last three visits (2/16, 9/15). UDS negative. Call to his pharmacy revealed he was not consistently picking up all his medications and had been out of some for >3 months.  BPs were back to his baseline on amlodipine 10mg  QD, Labetalol 200mg  BID, clonidine 0.1 mg TID, and hydralazine 10mg  TID. On day of discharge, patient's mother noted leg dragging while walking, and patient noted blurry vision and  increased HA. Repeat CT head showed NAICA. PT re-evaluated patient and felt he was safe for discharge with a cane for ambulation and HH-PT.   DM2: Pt unable to give a good history of his home regimen, but does report obtaining Lantus samples from a doctor who cannot recall. Pt maintained CBGs in 100s-160s on Lantus 5 units and moderate SSI requiring 6 units in 24 hrs. Lantus increased to 6 units prior to discharge. Last Hgb A1c 7.4 (12/2014).   CKDIII: Followed by [facility] last seen 08/2014 (with follow up on 02/28/15, but will try to get in earlier). Per chart review BL Cr 1.9-2.2. Increase in creatinine thought to be secondary to hypertensive emergency. Other electrolytes normal currently. Protein Cr ratio (0.42) suggests macroalbuminuria, which would typically indicate adding an ACEI or ARB, however, will defer to [facility] nephrology who is managing.  Anxiety/Depression: Pt endorses history of anxiety attacks, possibly panic disorder, pt experienced marital infidelity in the past. He was noted to have a flat affect the entire hospitalization. His mother feels he's been very depressed and not himself.  Added sertraline 50mg  QD for one week, titrate to 100mg  pending follow up, tolerance, and adherence. Consider increasing buspar to 10mg  BID.   Issues for Follow Up:  1. Ensure compliance to HTN regimen. Consider addition of ACE-i or ARB per nephrology. Per pt on Lasix due to LE edema, unsure if this is secondary to amlodipine but continue to monitor as Lasix was discontinued.  2. Ensure compliance to insulin regimen for DMII.  3. Assess tolerance and compliance with sertraline 50 mg, titrate to 100mg .   Significant Procedures: none  Significant Labs and Imaging:  CT Head Wo Contrast  Final Result    CT Head Wo  Contrast  Final Result      Recent Labs Lab 12/27/14 1235 12/27/14 1241 12/28/14 0533  WBC 7.5  --  8.7  HGB 12.7* 14.3 12.8*  HCT 37.9* 42.0 37.6*  PLT 262  --  268     Recent Labs Lab 12/27/14 1235 12/27/14 1241 12/28/14 0533 12/29/14 0524 12/29/14 1028  NA 138 139 136  --  137  K 3.9 3.8 3.5  --  3.7  CL 104 102 103  --  104  CO2 26  --  25  --  26  GLUCOSE 159* 160* 142*  --  167*  BUN 29* 31* 30*  --  32*  CREATININE 2.90* 2.70* 2.70*  --  2.81*  CALCIUM 9.4  --  8.8*  --  8.9  MG  --   --   --  2.3  --   PHOS  --   --   --  4.1  --   ALKPHOS 65  --   --   --   --   AST 17  --   --   --   --   ALT 12*  --   --   --   --   ALBUMIN 3.4*  --   --   --   --     TSH nl, Protein Creatinine 0.42   Results/Tests Pending at Time of Discharge: none  Discharge Medications:    Medication List    STOP taking these medications        furosemide 80 MG tablet  Commonly known as:  LASIX     potassium chloride SA 20 MEQ tablet  Commonly known as:  K-DUR,KLOR-CON     spironolactone 25 MG tablet  Commonly known as:  ALDACTONE      TAKE these medications        albuterol 108 (90 BASE) MCG/ACT inhaler  Commonly known as:  PROVENTIL HFA;VENTOLIN HFA  Inhale 1-2 puffs into the lungs every 6 (six) hours as needed for wheezing or shortness of breath.     amLODipine 10 MG tablet  Commonly known as:  NORVASC  Take 1 tablet (10 mg total) by mouth daily.     aspirin EC 81 MG tablet  Take 81 mg by mouth daily.     busPIRone 7.5 MG tablet  Commonly known as:  BUSPAR  Take 7.5 mg by mouth 2 (two) times daily.     cloNIDine 0.1 MG tablet  Commonly known as:  CATAPRES  Take 1 tablet (0.1 mg total) by mouth 3 (three) times daily.     hydrALAZINE 10 MG tablet  Commonly known as:  APRESOLINE  Take 1 tablet (10 mg total) by mouth 3 (three) times daily.     insulin aspart 100 UNIT/ML injection  Commonly known as:  NOVOLOG  Before each meal 3 times a day, 140-199 - 2 units, 200-250 - 6 units, 251-299 - 8 units,  300-349 - 10 units,  350 or above 12 units. Dispense syringes and needles as needed, Ok to switch to PEN if approved.     insulin  glargine 100 UNIT/ML injection  Commonly known as:  LANTUS  Inject 0.06 mLs (6 Units total) into the skin daily.     labetalol 200 MG tablet  Commonly known as:  NORMODYNE  Take 1 tablet (200 mg total) by mouth 2 (two) times daily.     multivitamin with minerals Tabs tablet  Take 1 tablet by mouth daily.  sertraline 50 MG tablet  Commonly known as:  ZOLOFT  Take 1 tablet (50 mg total) by mouth daily.        Discharge Instructions: Please refer to Patient Instructions section of EMR for full details.  Patient was counseled important signs and symptoms that should prompt return to medical care, changes in medications, dietary instructions, activity restrictions, and follow up appointments.   Follow-Up Appointments: Follow-up Information    Follow up with [facility].   Why:  HHPT/OT   Contact information:   [address] [place] [address] [phone]       Follow up with Bartholome Bill, MD. Go on 01/06/2015.   Specialty:  Family Medicine   Why:  9:30am for hospital followup and medication compliance check   Contact information:   [place] [address] [phone]       Follow up with [facility] On 01/12/2015.   Why:  8 pm for sleep study   Contact information:   [place]       Follow up with [facility].   Why:  cane   Contact information:   [address] [place] [address] [phone]       Follow up with Lolita Patella, MD On 02/28/2015.   Why:  Dr. Annice Pih office knows you need follow up sooner, they will call if they have any cancellations   Contact information:   [place] [address] [phone]       Note completed with the assistance of MSIV Sela Hilding. I have made appropriate corrections. My physical exam is documented separately in the progress note  from the day of discharge.Archie Patten, MD 12/30/2014, 5:06 PM

## 2014-12-29 NOTE — Progress Notes (Signed)
Physical Therapy Treatment Patient Details Name: [name] MRN: [id number] DOB: [birth year] Today's Date: 12/29/2014    History of Present Illness [name] is a 49 y.o. male presenting with slurred speech, word finding problems, and somnolence in the setting of elevated BP 240/140 consistent with hypertensive emergency. PMH is significant for DM2, uncontrolled HTN, CKDIII, OSA.  CT of head negative for acute abnormality.  Pt with questionabe hypertensive encephalopathy    PT Comments    Pt was able to demonstrate safe ambulation with cane for support.  Mother in room and pending d/c today.  Pt would continue to benefit for HHPT at discharge to help pt progress back to baseline independence, strength, and balance.   Follow Up Recommendations  Home health PT;Supervision for mobility/OOB     Equipment Recommendations  Cane    Recommendations for Other Services   NA     Precautions / Restrictions Precautions Precautions: Fall Precaution Comments: left leg weakness    Mobility  Bed Mobility Overal bed mobility: Modified Independent             General bed mobility comments: HOB elevated and use of railing for leverage  Transfers Overall transfer level: Needs assistance Equipment used: Straight cane Transfers: Sit to/from Stand Sit to Stand: Modified independent (Device/Increase time)         General transfer comment: reliance on hands for transitions  Ambulation/Gait Ambulation/Gait assistance: Supervision Ambulation Distance (Feet): 150 Feet Assistive device: Straight cane Gait Pattern/deviations: Step-through pattern;Decreased weight shift to left;Decreased stance time - left Gait velocity: decreased Gait velocity interpretation: Below normal speed for age/gender General Gait Details: PT with slow, but safe gait pattern with cane.  Educated on correct LE sequencing with cane in right hand. Measured cane up to pt's height.           Balance Overall  balance assessment: Needs assistance Sitting-balance support: Feet supported;No upper extremity supported Sitting balance-Leahy Scale: Good     Standing balance support: Single extremity supported Standing balance-Leahy Scale: Fair                      Cognition Arousal/Alertness: Awake/alert Behavior During Therapy: Flat affect Overall Cognitive Status: Impaired/Different from baseline               Problem Solving: Slow processing;Requires verbal cues;Requires tactile cues             Pertinent Vitals/Pain Pain Assessment: No/denies pain (pain earlier, but none now)           PT Goals (current goals can now be found in the care plan section) Acute Rehab PT Goals Patient Stated Goal: to go home today with his mom Progress towards PT goals: Progressing toward goals    Frequency  Min 3X/week    PT Plan Current plan remains appropriate       End of Session   Activity Tolerance: Patient limited by fatigue Patient left: in bed;with call bell/phone within reach;with family/visitor present     Time: [id number] PT Time Calculation (min) (ACUTE ONLY): 11 min  Charges:  $Gait Training: 8-22 mins                     Rebecca B. [place], [place], DPT [phone]   12/29/2014, 4:55 PM

## 2015-01-12 ENCOUNTER — Ambulatory Visit (HOSPITAL_BASED_OUTPATIENT_CLINIC_OR_DEPARTMENT_OTHER): Payer: Medicare Other | Attending: Family Medicine | Admitting: Radiology

## 2015-01-12 VITALS — Ht 66.5 in | Wt 245.0 lb

## 2015-01-12 DIAGNOSIS — I493 Ventricular premature depolarization: Secondary | ICD-10-CM | POA: Insufficient documentation

## 2015-01-12 DIAGNOSIS — I472 Ventricular tachycardia: Secondary | ICD-10-CM | POA: Insufficient documentation

## 2015-01-12 DIAGNOSIS — R51 Headache: Secondary | ICD-10-CM | POA: Insufficient documentation

## 2015-01-12 DIAGNOSIS — R0683 Snoring: Secondary | ICD-10-CM | POA: Insufficient documentation

## 2015-01-12 DIAGNOSIS — E669 Obesity, unspecified: Secondary | ICD-10-CM | POA: Insufficient documentation

## 2015-01-12 DIAGNOSIS — I10 Essential (primary) hypertension: Secondary | ICD-10-CM | POA: Insufficient documentation

## 2015-01-12 DIAGNOSIS — Z6839 Body mass index (BMI) 39.0-39.9, adult: Secondary | ICD-10-CM | POA: Insufficient documentation

## 2015-01-12 DIAGNOSIS — E119 Type 2 diabetes mellitus without complications: Secondary | ICD-10-CM | POA: Diagnosis not present

## 2015-01-12 DIAGNOSIS — R5383 Other fatigue: Secondary | ICD-10-CM | POA: Insufficient documentation

## 2015-01-12 DIAGNOSIS — G4733 Obstructive sleep apnea (adult) (pediatric): Secondary | ICD-10-CM

## 2015-01-12 DIAGNOSIS — R05 Cough: Secondary | ICD-10-CM | POA: Insufficient documentation

## 2015-01-12 DIAGNOSIS — G4700 Insomnia, unspecified: Secondary | ICD-10-CM | POA: Diagnosis not present

## 2015-01-15 ENCOUNTER — Encounter (HOSPITAL_BASED_OUTPATIENT_CLINIC_OR_DEPARTMENT_OTHER): Payer: Medicare Other | Admitting: Internal Medicine

## 2015-01-15 DIAGNOSIS — G4733 Obstructive sleep apnea (adult) (pediatric): Secondary | ICD-10-CM | POA: Diagnosis not present

## 2015-01-15 NOTE — Progress Notes (Signed)
   Patient Name: [name], [name] Date: 01/12/2015 Gender: Male D.O.B: [birth year] Age (years): 49 Referring Provider: Blane Ohara McDiarmid Height (inches): 34 Interpreting Physician: Baird Lyons MD, ABSM Weight (lbs): 245 RPSGT: Laren Everts BMI: 39 MRN: [id number] Neck Size: 17.00 CLINICAL INFORMATION Sleep Study Type: NPSG  Indication for sleep study: Diabetes, Fatigue, Hypertension, Morning Headaches, Obesity, OSA, Re-Evaluation, Witnessed Apneas  Epworth Sleepiness Score: 19  SLEEP STUDY TECHNIQUE As per the AASM Manual for the Scoring of Sleep and Associated Events v2.3 (April 2016) with a hypopnea requiring 4% desaturations.  The channels recorded and monitored were frontal, central and occipital EEG, electrooculogram (EOG), submentalis EMG (chin), nasal and oral airflow, thoracic and abdominal wall motion, anterior tibialis EMG, snore microphone, electrocardiogram, and pulse oximetry.  MEDICATIONS Patient's medications include: charted for review. Medications self-administered by patient during sleep study : amlodipine, spironolactone, clonidine  SLEEP ARCHITECTURE The study was initiated at 11:08:38 PM and ended at 5:24:36 AM.  Sleep onset time was 14.9 minutes and the sleep efficiency was 60.8%. The total sleep time was 228.5 minutes.  Stage REM latency was 208.5 minutes.  The patient spent 18.16% of the night in stage N1 sleep, 69.15% in stage N2 sleep, 0.44% in stage N3 and 12.25% in REM.  Alpha intrusion was absent.  Supine sleep was 9.19%.  Wake after sleep onset 132.6 minutes  RESPIRATORY PARAMETERS The overall apnea/hypopnea index (AHI) was 3.2 per hour. There were 0 total apneas, including 0 obstructive, 0 central and 0 mixed apneas. There were 12 hypopneas and 16 RERAs.  The AHI during Stage REM sleep was 12.9 per hour.  AHI while supine was 11.4 per hour.  The mean oxygen saturation was 95.53%. The minimum SpO2 during sleep was  85.00%.  Moderate snoring was noted during this study.  CARDIAC DATA The 2 lead EKG demonstrated sinus rhythm. The mean heart rate was 69.68 beats per minute. Other EKG findings include: PVCs.  LEG MOVEMENT DATA The total PLMS were 12 with a resulting PLMS index of 3.15. Associated arousal with leg movement index was 1.1 .  IMPRESSIONS No significant obstructive sleep apnea occurred during this study (AHI = 3.2/h). No significant central sleep apnea occurred during this study (CAI = 0.0/h). Mild oxygen desaturation was noted during this study (Min O2 = 85.00%). The patient snored with Moderate snoring volume. EKG findings include PVCs, including one 5-beat run of slow Vtach. Clinically significant periodic limb movements did not occur during sleep. No significant associated arousals. Difficulty maintaining sleep, with cough noted throughout the night and bathroom x 2.  DIAGNOSIS Difficulty maintaining sleep- insomnia Primary Snoring (786.09 [R06.83 ICD-10])  RECOMMENDATIONS Avoid alcohol, sedatives and other CNS depressants that may worsen sleep apnea and disrupt normal sleep architecture. Sleep hygiene should be reviewed to assess factors that may improve sleep quality. Weight management and regular exercise should be initiated or continued if appropriate. Consider addressing cough if appropriate  Deneise Lever Diplomate, American Board of Sleep Medicine  ELECTRONICALLY SIGNED ON:  01/15/2015, 1:17 PM [place] PH: [phone]   FX: [phone] [place]

## 2015-01-20 DIAGNOSIS — R809 Proteinuria, unspecified: Secondary | ICD-10-CM | POA: Insufficient documentation

## 2015-01-26 ENCOUNTER — Other Ambulatory Visit: Payer: Self-pay | Admitting: Internal Medicine

## 2015-01-27 ENCOUNTER — Other Ambulatory Visit: Payer: Self-pay | Admitting: Internal Medicine

## 2015-02-07 ENCOUNTER — Other Ambulatory Visit: Payer: Self-pay | Admitting: Internal Medicine

## 2015-02-17 ENCOUNTER — Other Ambulatory Visit: Payer: Self-pay | Admitting: Internal Medicine

## 2015-09-20 DIAGNOSIS — F3341 Major depressive disorder, recurrent, in partial remission: Secondary | ICD-10-CM | POA: Insufficient documentation

## 2015-09-20 DIAGNOSIS — F32A Depression, unspecified: Secondary | ICD-10-CM | POA: Insufficient documentation

## 2015-10-31 ENCOUNTER — Ambulatory Visit (HOSPITAL_COMMUNITY): Payer: Medicare Other | Admitting: Clinical

## 2015-12-05 ENCOUNTER — Encounter (HOSPITAL_COMMUNITY): Payer: Self-pay

## 2015-12-05 ENCOUNTER — Emergency Department (HOSPITAL_COMMUNITY)
Admission: EM | Admit: 2015-12-05 | Discharge: 2015-12-06 | Disposition: A | Discharge status: Home / Self Care | Payer: Medicare Other | Attending: Emergency Medicine | Admitting: Emergency Medicine

## 2015-12-05 ENCOUNTER — Emergency Department (HOSPITAL_COMMUNITY): Payer: Medicare Other

## 2015-12-05 DIAGNOSIS — N183 Chronic kidney disease, stage 3 (moderate): Secondary | ICD-10-CM | POA: Diagnosis not present

## 2015-12-05 DIAGNOSIS — Z794 Long term (current) use of insulin: Secondary | ICD-10-CM | POA: Insufficient documentation

## 2015-12-05 DIAGNOSIS — E1149 Type 2 diabetes mellitus with other diabetic neurological complication: Secondary | ICD-10-CM | POA: Insufficient documentation

## 2015-12-05 DIAGNOSIS — R42 Dizziness and giddiness: Secondary | ICD-10-CM | POA: Diagnosis present

## 2015-12-05 DIAGNOSIS — I151 Hypertension secondary to other renal disorders: Secondary | ICD-10-CM | POA: Diagnosis not present

## 2015-12-05 DIAGNOSIS — E1122 Type 2 diabetes mellitus with diabetic chronic kidney disease: Secondary | ICD-10-CM | POA: Insufficient documentation

## 2015-12-05 DIAGNOSIS — R0602 Shortness of breath: Secondary | ICD-10-CM | POA: Insufficient documentation

## 2015-12-05 DIAGNOSIS — R0600 Dyspnea, unspecified: Secondary | ICD-10-CM

## 2015-12-05 DIAGNOSIS — Z7982 Long term (current) use of aspirin: Secondary | ICD-10-CM | POA: Diagnosis not present

## 2015-12-05 DIAGNOSIS — I159 Secondary hypertension, unspecified: Secondary | ICD-10-CM

## 2015-12-05 DIAGNOSIS — Z79899 Other long term (current) drug therapy: Secondary | ICD-10-CM | POA: Insufficient documentation

## 2015-12-05 LAB — CBC
HCT: 42.7 % (ref 39.0–52.0)
HEMOGLOBIN: 14.3 g/dL (ref 13.0–17.0)
MCH: 29.5 pg (ref 26.0–34.0)
MCHC: 33.5 g/dL (ref 30.0–36.0)
MCV: 88 fL (ref 78.0–100.0)
Platelets: 272 10*3/uL (ref 150–400)
RBC: 4.85 MIL/uL (ref 4.22–5.81)
RDW: 13.3 % (ref 11.5–15.5)
WBC: 8.4 10*3/uL (ref 4.0–10.5)

## 2015-12-05 LAB — URINE MICROSCOPIC-ADD ON

## 2015-12-05 LAB — BASIC METABOLIC PANEL
ANION GAP: 12 (ref 5–15)
BUN: 25 mg/dL — ABNORMAL HIGH (ref 6–20)
CALCIUM: 9.3 mg/dL (ref 8.9–10.3)
CO2: 26 mmol/L (ref 22–32)
Chloride: 97 mmol/L — ABNORMAL LOW (ref 101–111)
Creatinine, Ser: 3.02 mg/dL — ABNORMAL HIGH (ref 0.61–1.24)
GFR, EST AFRICAN AMERICAN: 27 mL/min — AB (ref >60)
GFR, EST NON AFRICAN AMERICAN: 23 mL/min — AB (ref >60)
Glucose, Bld: 267 mg/dL — ABNORMAL HIGH (ref 65–99)
POTASSIUM: 3.3 mmol/L — AB (ref 3.5–5.1)
SODIUM: 135 mmol/L (ref 135–145)

## 2015-12-05 LAB — URINALYSIS, ROUTINE W REFLEX MICROSCOPIC
BILIRUBIN URINE: NEGATIVE
Glucose, UA: 250 mg/dL — AB
Hgb urine dipstick: NEGATIVE
Ketones, ur: NEGATIVE mg/dL
LEUKOCYTES UA: NEGATIVE
NITRITE: NEGATIVE
Protein, ur: >300 mg/dL — AB
SPECIFIC GRAVITY, URINE: 1.027 (ref 1.005–1.030)
pH: 5.5 (ref 5.0–8.0)

## 2015-12-05 MED ORDER — SODIUM CHLORIDE 0.9 % IV BOLUS (SEPSIS)
500.0000 mL | Freq: Once | INTRAVENOUS | Status: AC
  Administered 2015-12-05: 500 mL via INTRAVENOUS

## 2015-12-05 NOTE — ED Triage Notes (Signed)
Pt states feels dizzy since this AM. Pt states also some SOB. Pt with some delayed responses. A/o x 4. Pt denies any chest pain or LOC.

## 2015-12-05 NOTE — ED Provider Notes (Signed)
[facility] DEPT Provider Note   CSN: [id number] Arrival date & time: 12/05/15  1631     History   Chief Complaint Chief Complaint  Patient presents with  . Shortness of Breath  . Dizziness    HPI [name] is a 50 y.o. male.  50 yo M here with multiple complaints that all seem to be related to his BP medication. He has a temporal relationship of sleepiness, light headedness and fatigue about an hour after taking his clonidine which makes him sleep for multiple hours. He has intermittent headaches when his BP his high, but states it is rare for his BP to be below [id number] systolic. Has known CKD and has been seeing a nephrologist for BP control. Progressively worsening chronic HTN without other known modifying factors.    The history is provided by the patient, the spouse and medical records.    Past Medical History:  Diagnosis Date  . Acute kidney injury ([place]) 12/28/2014  . Adjustment disorder with mixed anxiety and depressed mood 12/28/2014  . CPAP (continuous positive airway pressure) dependence   . Diabetes mellitus without complication ([place])   . DM (diabetes mellitus) type II controlled with renal manifestation ([place]) 12/28/2014  . DM (diabetes mellitus) type II controlled, neurological manifestation ([place]) 12/28/2014  . Encephalopathy, hypertensive 12/28/2014  . Hypertension   . Hypertensive emergency without congestive heart failure 12/27/2014  . Possible Panic disorder 12/28/2014  . Renal disorder   . 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 Sleep apnea     Patient Active Problem List   Diagnosis Date Noted  . Encephalopathy, hypertensive 12/28/2014  . Acute kidney injury ([place]) 12/28/2014  . DM (diabetes mellitus) type II controlled with renal manifestation ([place]) 12/28/2014  . DM (diabetes mellitus) type II controlled, neurological manifestation ([place]) 12/28/2014  . Adjustment disorder with mixed anxiety and depressed mood 12/28/2014  . Possible Panic disorder 12/28/2014    . Hypertensive emergency without congestive heart failure 12/27/2014  . Hyponatremia 06/27/2013  . Essential hypertension, malignant 06/27/2013  . Hyperglycemia 06/26/2013  . DKA (diabetic ketoacidoses) ([place]) 06/26/2013  . Severe uncontrolled hypertension 03/31/2011  . Hypokalemia 03/28/2011  . 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 CKD (chronic kidney disease) stage 3, GFR 30-59 ml/min 03/28/2011    Past Surgical History:  Procedure Laterality Date  . NO PAST SURGERIES      OB History    No data available       Home Medications    Prior to Admission medications   Medication Sig Start Date End Date Taking? Authorizing Provider  albuterol (PROVENTIL HFA;VENTOLIN HFA) 108 (90 BASE) MCG/ACT inhaler Inhale 1-2 puffs into the lungs every 6 (six) hours as needed for wheezing or shortness of breath.   Yes Historical Provider, MD  amLODipine (NORVASC) 10 MG tablet Take 1 tablet (10 mg total) by mouth daily. 12/29/14  Yes Smiley Houseman, MD  aspirin EC 81 MG tablet Take 81 mg by mouth daily.   Yes Historical Provider, MD  cloNIDine (CATAPRES) 0.1 MG tablet Take 1 tablet (0.1 mg total) by mouth 3 (three) times daily. 12/29/14  Yes Smiley Houseman, MD  furosemide (LASIX) 80 MG tablet Take 120 mg by mouth daily.   Yes Historical Provider, MD  gabapentin (NEURONTIN) 300 MG capsule Take 300 mg by mouth 2 (two) times daily.   Yes Historical Provider, MD  hydrALAZINE (APRESOLINE) 50 MG tablet Take 50 mg by mouth 3 (three) times daily.   Yes Historical Provider, MD  insulin aspart (NOVOLOG)  100 UNIT/ML injection Before each meal 3 times a day, 140-199 - 2 units, 200-250 - 6 units, 251-299 - 8 units,  300-349 - 10 units,  350 or above 12 units. Dispense syringes and needles as needed, Ok to switch to PEN if approved. Patient taking differently: Inject 2-12 Units into the skin 3 (three) times daily with meals. Before each meal 3 times a day, 140-199 - 2 units, 200-250 - 6 units, 251-299 - 8  units,  300-349 - 10 units,  350 or above 12 units. Sliding scale 06/28/13  Yes Thurnell Lose, MD  insulin glargine (LANTUS) 100 UNIT/ML injection Inject 0.06 mLs (6 Units total) into the skin daily. 12/29/14  Yes Smiley Houseman, MD  potassium chloride SA (K-DUR,KLOR-CON) 20 MEQ tablet Take 20 mEq by mouth 2 (two) times daily.   Yes Historical Provider, MD  spironolactone (ALDACTONE) 25 MG tablet Take 25 mg by mouth 2 (two) times daily.   Yes Historical Provider, MD  traMADol (ULTRAM) 50 MG tablet Take 100 mg by mouth every 8 (eight) hours as needed for moderate pain.   Yes Historical Provider, MD    Family History Family History  Problem Relation Age of Onset  . Hypertension Mother     Social History Social History  Substance Use Topics  . Smoking status: Never Smoker  . Smokeless tobacco: Never Used  . Alcohol use No     Allergies   Review of patient's allergies indicates no known allergies.   Review of Systems Review of Systems  Constitutional: Positive for fatigue (with clonidine).  Respiratory: Positive for chest tightness (intermittently but nothing currently or consistently) and shortness of breath (intermittently when ambulating).   Gastrointestinal: Negative for abdominal pain.  Neurological: Positive for dizziness (when taking clonidine).  All other systems reviewed and are negative.    Physical Exam Updated Vital Signs BP (!) 173/109   Pulse 65   Temp 97.5 F (36.4 C)   Resp 16   Ht 5\' 7"  (1.702 m)   Wt 240 lb (108.9 kg)   SpO2 95%   BMI 37.59 kg/m   Physical Exam  Constitutional: He appears well-developed and well-nourished.  HENT:  Head: Normocephalic and atraumatic.  Eyes: Conjunctivae are normal.  Neck: Neck supple.  Cardiovascular: Normal rate and regular rhythm.   No murmur heard. Pulmonary/Chest: Effort normal and breath sounds normal. No respiratory distress.  Abdominal: Soft. There is no tenderness.  Musculoskeletal: He exhibits no  edema.  Neurological: He is alert.  No altered mental status, able to give full seemingly accurate history.  Face is symmetric, EOM's intact, pupils equal and reactive, vision intact, tongue and uvula midline without deviation Upper and Lower extremity motor 5/5, intact pain perception in distal extremities, 2+ reflexes in biceps, patella and achilles tendons. Finger to nose normal, heel to shin normal.   Skin: Skin is warm and dry.  Psychiatric: He has a normal mood and affect.  Nursing note and vitals reviewed.    ED Treatments / Results  Labs (all labs ordered are listed, but only abnormal results are displayed) Labs Reviewed  BASIC METABOLIC PANEL - Abnormal; Notable for the following:       Result Value   Potassium 3.3 (*)    Chloride 97 (*)    Glucose, Bld 267 (*)    BUN 25 (*)    Creatinine, Ser 3.02 (*)    GFR calc non Af Amer 23 (*)    GFR calc Af Amer 27 (*)  All other components within normal limits  URINALYSIS, ROUTINE W REFLEX MICROSCOPIC (NOT AT [facility]) - Abnormal; Notable for the following:    Glucose, UA 250 (*)    Protein, ur >300 (*)    All other components within normal limits  TROPONIN I - Abnormal; Notable for the following:    Troponin I 0.04 (*)    All other components within normal limits  URINE MICROSCOPIC-ADD ON - Abnormal; Notable for the following:    Squamous Epithelial / LPF 0-5 (*)    Bacteria, UA RARE (*)    Casts HYALINE CASTS (*)    All other components within normal limits  CBC    EKG  EKG Interpretation  Date/Time:  Monday December 05 2015 16:35:27 EDT Ventricular Rate:  75 PR Interval:  188 QRS Duration: 96 QT Interval:  430 QTC Calculation: 480 R Axis:   -18 Text Interpretation:  Normal sinus rhythm T wave abnormality, consider lateral ischemia Prolonged QT Abnormal ECG new TWI in V5, III and aVF since september 2016 Confirmed by [facility] MD, Corene Cornea [phone]) on 12/05/2015 9:34:16 PM       Radiology No results  found.  Procedures Procedures (including critical care time)  Medications Ordered in ED Medications  sodium chloride 0.9 % bolus 500 mL (0 mLs Intravenous Stopped 12/05/15 2258)     Initial Impression / Assessment and Plan / ED Course  I have reviewed the triage vital signs and the nursing notes.  Pertinent labs & imaging results that were available during my care of the patient were reviewed by me and considered in my medical decision making (see chart for details).  Clinical Course    I suspect his symptoms are related to his clonidine and less so to his uncontrolled hypertension. He does, however have worsening CKD and a troponin leak likely 2/2 uncontrolled HTN. He needs urgent (but not emergent) BP control. He is already on 5 antihypertensives, and is mostly compliant so will refer to cardiology to further manage this complex situation.   Final Clinical Impressions(s) / ED Diagnoses   Final diagnoses:  Dizziness  Dyspnea  Secondary hypertension, unspecified    New Prescriptions Discharge Medication List as of 12/06/2015  1:06 AM       Merrily Pew, MD 12/08/15 1555

## 2015-12-05 NOTE — ED Notes (Signed)
Informed Eric - RN of pt's BP.

## 2015-12-05 NOTE — ED Notes (Signed)
Pt states he took his regular BP medicine in the lobby prior to coming back to treatment room.

## 2015-12-06 LAB — TROPONIN I: Troponin I: 0.04 ng/mL (ref <0.03)

## 2015-12-06 NOTE — ED Notes (Signed)
Pt verbalized understanding of discharge instructions and follow-up care. Denies further questions at this time.

## 2017-04-04 ENCOUNTER — Encounter (HOSPITAL_COMMUNITY): Payer: Self-pay | Admitting: Emergency Medicine

## 2017-04-04 ENCOUNTER — Emergency Department (HOSPITAL_COMMUNITY): Payer: No Typology Code available for payment source

## 2017-04-04 ENCOUNTER — Other Ambulatory Visit: Payer: Self-pay

## 2017-04-04 ENCOUNTER — Emergency Department (HOSPITAL_COMMUNITY)
Admission: EM | Admit: 2017-04-04 | Discharge: 2017-04-04 | Disposition: A | Discharge status: Home / Self Care | Payer: No Typology Code available for payment source | Attending: Emergency Medicine | Admitting: Emergency Medicine

## 2017-04-04 DIAGNOSIS — Z7982 Long term (current) use of aspirin: Secondary | ICD-10-CM | POA: Diagnosis not present

## 2017-04-04 DIAGNOSIS — Z79899 Other long term (current) drug therapy: Secondary | ICD-10-CM | POA: Insufficient documentation

## 2017-04-04 DIAGNOSIS — Y92410 Unspecified street and highway as the place of occurrence of the external cause: Secondary | ICD-10-CM | POA: Insufficient documentation

## 2017-04-04 DIAGNOSIS — I509 Heart failure, unspecified: Secondary | ICD-10-CM | POA: Diagnosis not present

## 2017-04-04 DIAGNOSIS — I130 Hypertensive heart and chronic kidney disease with heart failure and stage 1 through stage 4 chronic kidney disease, or unspecified chronic kidney disease: Secondary | ICD-10-CM | POA: Insufficient documentation

## 2017-04-04 DIAGNOSIS — Y999 Unspecified external cause status: Secondary | ICD-10-CM | POA: Insufficient documentation

## 2017-04-04 DIAGNOSIS — Z794 Long term (current) use of insulin: Secondary | ICD-10-CM | POA: Insufficient documentation

## 2017-04-04 DIAGNOSIS — S060X0A Concussion without loss of consciousness, initial encounter: Secondary | ICD-10-CM

## 2017-04-04 DIAGNOSIS — E1122 Type 2 diabetes mellitus with diabetic chronic kidney disease: Secondary | ICD-10-CM | POA: Diagnosis not present

## 2017-04-04 DIAGNOSIS — M549 Dorsalgia, unspecified: Secondary | ICD-10-CM | POA: Diagnosis not present

## 2017-04-04 DIAGNOSIS — M545 Low back pain, unspecified: Secondary | ICD-10-CM

## 2017-04-04 DIAGNOSIS — N183 Chronic kidney disease, stage 3 (moderate): Secondary | ICD-10-CM | POA: Insufficient documentation

## 2017-04-04 DIAGNOSIS — Y9389 Activity, other specified: Secondary | ICD-10-CM | POA: Insufficient documentation

## 2017-04-04 DIAGNOSIS — M79672 Pain in left foot: Secondary | ICD-10-CM | POA: Diagnosis not present

## 2017-04-04 DIAGNOSIS — S0990XA Unspecified injury of head, initial encounter: Secondary | ICD-10-CM | POA: Diagnosis present

## 2017-04-04 MED ORDER — ACETAMINOPHEN 325 MG PO TABS: 650.0000 mg | ORAL_TABLET | Freq: Four times a day (QID) | ORAL | 0 refills | Status: DC | PRN

## 2017-04-04 MED ORDER — ACETAMINOPHEN 325 MG PO TABS
650.0000 mg | ORAL_TABLET | Freq: Once | ORAL | Status: AC
  Administered 2017-04-04: 650 mg via ORAL
  Filled 2017-04-04: qty 2
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
## 2017-04-04 NOTE — ED Notes (Signed)
Pt and family understood dc material. NAD noted. Script given at dc 

## 2017-05-13 ENCOUNTER — Encounter (HOSPITAL_COMMUNITY): Payer: Self-pay | Admitting: *Deleted

## 2017-05-13 ENCOUNTER — Emergency Department (HOSPITAL_COMMUNITY)
Admission: EM | Admit: 2017-05-13 | Discharge: 2017-05-13 | Disposition: A | Discharge status: Home / Self Care | Payer: Medicare Other | Attending: Emergency Medicine | Admitting: Emergency Medicine

## 2017-05-13 ENCOUNTER — Emergency Department (HOSPITAL_COMMUNITY): Payer: Medicare Other

## 2017-05-13 ENCOUNTER — Other Ambulatory Visit: Payer: Self-pay

## 2017-05-13 DIAGNOSIS — Z794 Long term (current) use of insulin: Secondary | ICD-10-CM | POA: Insufficient documentation

## 2017-05-13 DIAGNOSIS — Y9389 Activity, other specified: Secondary | ICD-10-CM | POA: Insufficient documentation

## 2017-05-13 DIAGNOSIS — Z7982 Long term (current) use of aspirin: Secondary | ICD-10-CM | POA: Insufficient documentation

## 2017-05-13 DIAGNOSIS — J181 Lobar pneumonia, unspecified organism: Secondary | ICD-10-CM | POA: Insufficient documentation

## 2017-05-13 DIAGNOSIS — Z79899 Other long term (current) drug therapy: Secondary | ICD-10-CM | POA: Insufficient documentation

## 2017-05-13 DIAGNOSIS — Y92410 Unspecified street and highway as the place of occurrence of the external cause: Secondary | ICD-10-CM | POA: Insufficient documentation

## 2017-05-13 DIAGNOSIS — Y999 Unspecified external cause status: Secondary | ICD-10-CM | POA: Insufficient documentation

## 2017-05-13 DIAGNOSIS — N183 Chronic kidney disease, stage 3 (moderate): Secondary | ICD-10-CM | POA: Insufficient documentation

## 2017-05-13 DIAGNOSIS — S2231XA Fracture of one rib, right side, initial encounter for closed fracture: Secondary | ICD-10-CM | POA: Insufficient documentation

## 2017-05-13 DIAGNOSIS — I129 Hypertensive chronic kidney disease with stage 1 through stage 4 chronic kidney disease, or unspecified chronic kidney disease: Secondary | ICD-10-CM | POA: Insufficient documentation

## 2017-05-13 DIAGNOSIS — E1122 Type 2 diabetes mellitus with diabetic chronic kidney disease: Secondary | ICD-10-CM | POA: Insufficient documentation

## 2017-05-13 DIAGNOSIS — M542 Cervicalgia: Secondary | ICD-10-CM | POA: Insufficient documentation

## 2017-05-13 DIAGNOSIS — J189 Pneumonia, unspecified organism: Secondary | ICD-10-CM

## 2017-05-13 MED ORDER — AZITHROMYCIN 250 MG PO TABS: 250.0000 mg | ORAL_TABLET | Freq: Every day | ORAL | 0 refills | Status: DC

## 2017-05-13 MED ORDER — AMOXICILLIN 500 MG PO CAPS: 1000.0000 mg | ORAL_CAPSULE | Freq: Three times a day (TID) | ORAL | 0 refills | Status: DC

## 2017-05-13 MED ORDER — OXYCODONE-ACETAMINOPHEN 5-325 MG PO TABS
1.0000 | ORAL_TABLET | Freq: Once | ORAL | Status: AC
  Administered 2017-05-13: 1 via ORAL
  Filled 2017-05-13: qty 1

## 2017-05-13 MED ORDER — OXYCODONE-ACETAMINOPHEN 5-325 MG PO TABS: 1.0000 | ORAL_TABLET | Freq: Three times a day (TID) | ORAL | 0 refills | Status: DC | PRN
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
## 2017-05-13 NOTE — Discharge Instructions (Addendum)
Please read attached information. If you experience any new or worsening signs or symptoms please return to the emergency room for evaluation. Please follow-up with your primary care provider or specialist as discussed. Please use medication prescribed only as directed and discontinue taking if you have any concerning signs or symptoms.   °

## 2017-05-16 ENCOUNTER — Other Ambulatory Visit: Payer: Self-pay

## 2017-05-16 ENCOUNTER — Encounter (HOSPITAL_COMMUNITY): Payer: Self-pay | Admitting: *Deleted

## 2017-05-16 ENCOUNTER — Emergency Department (HOSPITAL_COMMUNITY): Payer: Medicare Other

## 2017-05-16 ENCOUNTER — Inpatient Hospital Stay (HOSPITAL_COMMUNITY)
Admission: EM | Admit: 2017-05-16 | Discharge: 2017-05-22 | DRG: 163 | Disposition: A | Discharge status: Home Health | Payer: Medicare Other | Attending: Family Medicine | Admitting: Family Medicine

## 2017-05-16 DIAGNOSIS — E1165 Type 2 diabetes mellitus with hyperglycemia: Secondary | ICD-10-CM | POA: Diagnosis present

## 2017-05-16 DIAGNOSIS — R739 Hyperglycemia, unspecified: Secondary | ICD-10-CM

## 2017-05-16 DIAGNOSIS — I5031 Acute diastolic (congestive) heart failure: Secondary | ICD-10-CM

## 2017-05-16 DIAGNOSIS — I361 Nonrheumatic tricuspid (valve) insufficiency: Secondary | ICD-10-CM | POA: Diagnosis not present

## 2017-05-16 DIAGNOSIS — N179 Acute kidney failure, unspecified: Secondary | ICD-10-CM | POA: Diagnosis present

## 2017-05-16 DIAGNOSIS — Z7982 Long term (current) use of aspirin: Secondary | ICD-10-CM

## 2017-05-16 DIAGNOSIS — J9690 Respiratory failure, unspecified, unspecified whether with hypoxia or hypercapnia: Secondary | ICD-10-CM

## 2017-05-16 DIAGNOSIS — Z9114 Patient's other noncompliance with medication regimen: Secondary | ICD-10-CM

## 2017-05-16 DIAGNOSIS — Z9689 Presence of other specified functional implants: Secondary | ICD-10-CM | POA: Diagnosis not present

## 2017-05-16 DIAGNOSIS — I10 Essential (primary) hypertension: Secondary | ICD-10-CM

## 2017-05-16 DIAGNOSIS — I509 Heart failure, unspecified: Secondary | ICD-10-CM | POA: Diagnosis present

## 2017-05-16 DIAGNOSIS — G4733 Obstructive sleep apnea (adult) (pediatric): Secondary | ICD-10-CM | POA: Diagnosis present

## 2017-05-16 DIAGNOSIS — I5032 Chronic diastolic (congestive) heart failure: Secondary | ICD-10-CM | POA: Diagnosis present

## 2017-05-16 DIAGNOSIS — G9340 Encephalopathy, unspecified: Secondary | ICD-10-CM | POA: Diagnosis not present

## 2017-05-16 DIAGNOSIS — N184 Chronic kidney disease, stage 4 (severe): Secondary | ICD-10-CM | POA: Diagnosis present

## 2017-05-16 DIAGNOSIS — R6510 Systemic inflammatory response syndrome (SIRS) of non-infectious origin without acute organ dysfunction: Secondary | ICD-10-CM

## 2017-05-16 DIAGNOSIS — G8918 Other acute postprocedural pain: Secondary | ICD-10-CM

## 2017-05-16 DIAGNOSIS — J90 Pleural effusion, not elsewhere classified: Secondary | ICD-10-CM | POA: Diagnosis present

## 2017-05-16 DIAGNOSIS — E1122 Type 2 diabetes mellitus with diabetic chronic kidney disease: Secondary | ICD-10-CM | POA: Diagnosis present

## 2017-05-16 DIAGNOSIS — J869 Pyothorax without fistula: Secondary | ICD-10-CM | POA: Diagnosis present

## 2017-05-16 DIAGNOSIS — F410 Panic disorder [episodic paroxysmal anxiety] without agoraphobia: Secondary | ICD-10-CM | POA: Diagnosis present

## 2017-05-16 DIAGNOSIS — I130 Hypertensive heart and chronic kidney disease with heart failure and stage 1 through stage 4 chronic kidney disease, or unspecified chronic kidney disease: Secondary | ICD-10-CM | POA: Diagnosis present

## 2017-05-16 DIAGNOSIS — I160 Hypertensive urgency: Secondary | ICD-10-CM | POA: Diagnosis not present

## 2017-05-16 DIAGNOSIS — J9811 Atelectasis: Secondary | ICD-10-CM | POA: Diagnosis present

## 2017-05-16 DIAGNOSIS — Z794 Long term (current) use of insulin: Secondary | ICD-10-CM | POA: Diagnosis not present

## 2017-05-16 DIAGNOSIS — J9601 Acute respiratory failure with hypoxia: Secondary | ICD-10-CM | POA: Diagnosis not present

## 2017-05-16 DIAGNOSIS — E1169 Type 2 diabetes mellitus with other specified complication: Secondary | ICD-10-CM

## 2017-05-16 DIAGNOSIS — Z79899 Other long term (current) drug therapy: Secondary | ICD-10-CM | POA: Diagnosis not present

## 2017-05-16 DIAGNOSIS — E119 Type 2 diabetes mellitus without complications: Secondary | ICD-10-CM | POA: Diagnosis not present

## 2017-05-16 DIAGNOSIS — J189 Pneumonia, unspecified organism: Secondary | ICD-10-CM

## 2017-05-16 DIAGNOSIS — S2231XD Fracture of one rib, right side, subsequent encounter for fracture with routine healing: Secondary | ICD-10-CM

## 2017-05-16 DIAGNOSIS — I5033 Acute on chronic diastolic (congestive) heart failure: Secondary | ICD-10-CM

## 2017-05-16 DIAGNOSIS — R0602 Shortness of breath: Secondary | ICD-10-CM | POA: Diagnosis present

## 2017-05-16 DIAGNOSIS — Z4682 Encounter for fitting and adjustment of non-vascular catheter: Secondary | ICD-10-CM

## 2017-05-16 LAB — CBC WITH DIFFERENTIAL/PLATELET
BASOS ABS: 0 10*3/uL (ref 0.0–0.1)
Basophils Relative: 0 %
Eosinophils Absolute: 0 10*3/uL (ref 0.0–0.7)
Eosinophils Relative: 0 %
HCT: 36 % — ABNORMAL LOW (ref 39.0–52.0)
Hemoglobin: 12.4 g/dL — ABNORMAL LOW (ref 13.0–17.0)
LYMPHS ABS: 1.7 10*3/uL (ref 0.7–4.0)
LYMPHS PCT: 11 %
MCH: 29.3 pg (ref 26.0–34.0)
MCHC: 34.4 g/dL (ref 30.0–36.0)
MCV: 85.1 fL (ref 78.0–100.0)
MONO ABS: 1 10*3/uL (ref 0.1–1.0)
MONOS PCT: 6 %
Neutro Abs: 12.8 10*3/uL — ABNORMAL HIGH (ref 1.7–7.7)
Neutrophils Relative %: 83 %
PLATELETS: 420 10*3/uL — AB (ref 150–400)
RBC: 4.23 MIL/uL (ref 4.22–5.81)
RDW: 12.5 % (ref 11.5–15.5)
WBC: 15.5 10*3/uL — ABNORMAL HIGH (ref 4.0–10.5)

## 2017-05-16 LAB — URINALYSIS, ROUTINE W REFLEX MICROSCOPIC
Bilirubin Urine: NEGATIVE
Glucose, UA: >=500 mg/dL — AB
Hgb urine dipstick: NEGATIVE
KETONES UR: NEGATIVE mg/dL
Leukocytes, UA: NEGATIVE
Nitrite: NEGATIVE
PROTEIN: 30 mg/dL — AB
SQUAMOUS EPITHELIAL / LPF: NONE SEEN
Specific Gravity, Urine: 1.018 (ref 1.005–1.030)
pH: 5 (ref 5.0–8.0)

## 2017-05-16 LAB — COMPREHENSIVE METABOLIC PANEL
ALT: 17 U/L (ref 17–63)
AST: 17 U/L (ref 15–41)
Albumin: 2.7 g/dL — ABNORMAL LOW (ref 3.5–5.0)
Alkaline Phosphatase: 153 U/L — ABNORMAL HIGH (ref 38–126)
Anion gap: 16 — ABNORMAL HIGH (ref 5–15)
BUN: 48 mg/dL — AB (ref 6–20)
CHLORIDE: 86 mmol/L — AB (ref 101–111)
CO2: 19 mmol/L — ABNORMAL LOW (ref 22–32)
CREATININE: 3.32 mg/dL — AB (ref 0.61–1.24)
Calcium: 9.5 mg/dL (ref 8.9–10.3)
GFR calc Af Amer: 23 mL/min — ABNORMAL LOW (ref >60)
GFR, EST NON AFRICAN AMERICAN: 20 mL/min — AB (ref >60)
Glucose, Bld: 584 mg/dL (ref 65–99)
Potassium: 5.2 mmol/L — ABNORMAL HIGH (ref 3.5–5.1)
Sodium: 121 mmol/L — ABNORMAL LOW (ref 135–145)
Total Bilirubin: 0.7 mg/dL (ref 0.3–1.2)
Total Protein: 8.4 g/dL — ABNORMAL HIGH (ref 6.5–8.1)

## 2017-05-16 LAB — BASIC METABOLIC PANEL
Anion gap: 14 (ref 5–15)
BUN: 48 mg/dL — AB (ref 6–20)
CHLORIDE: 90 mmol/L — AB (ref 101–111)
CO2: 20 mmol/L — AB (ref 22–32)
CREATININE: 3.19 mg/dL — AB (ref 0.61–1.24)
Calcium: 9.2 mg/dL (ref 8.9–10.3)
GFR calc Af Amer: 25 mL/min — ABNORMAL LOW (ref >60)
GFR calc non Af Amer: 21 mL/min — ABNORMAL LOW (ref >60)
GLUCOSE: 507 mg/dL — AB (ref 65–99)
POTASSIUM: 4.7 mmol/L (ref 3.5–5.1)
SODIUM: 124 mmol/L — AB (ref 135–145)

## 2017-05-16 LAB — CBG MONITORING, ED
GLUCOSE-CAPILLARY: 553 mg/dL — AB (ref 65–99)
Glucose-Capillary: 453 mg/dL — ABNORMAL HIGH (ref 65–99)
Glucose-Capillary: 355 mg/dL — ABNORMAL HIGH (ref 65–99)
Glucose-Capillary: 249 mg/dL — ABNORMAL HIGH (ref 65–99)

## 2017-05-16 LAB — PROTIME-INR
INR: 1.14
Prothrombin Time: 14.5 seconds (ref 11.4–15.2)

## 2017-05-16 LAB — I-STAT CG4 LACTIC ACID, ED
LACTIC ACID, VENOUS: 1.36 mmol/L (ref 0.5–1.9)
Lactic Acid, Venous: 1.18 mmol/L (ref 0.5–1.9)

## 2017-05-16 LAB — LACTATE DEHYDROGENASE: LDH: 124 U/L (ref 98–192)

## 2017-05-16 LAB — BRAIN NATRIURETIC PEPTIDE: B NATRIURETIC PEPTIDE 5: 64.7 pg/mL (ref 0.0–100.0)

## 2017-05-16 MED ORDER — SODIUM CHLORIDE 0.9 % IV SOLN
INTRAVENOUS | Status: DC
  Administered 2017-05-16 – 2017-05-17 (×2): via INTRAVENOUS

## 2017-05-16 MED ORDER — INSULIN ASPART 100 UNIT/ML ~~LOC~~ SOLN
18.0000 [IU] | Freq: Once | SUBCUTANEOUS | Status: AC
  Administered 2017-05-16: 18 [IU] via SUBCUTANEOUS
  Filled 2017-05-16: qty 1

## 2017-05-16 MED ORDER — IPRATROPIUM-ALBUTEROL 0.5-2.5 (3) MG/3ML IN SOLN
3.0000 mL | Freq: Once | RESPIRATORY_TRACT | Status: AC
  Administered 2017-05-16: 3 mL via RESPIRATORY_TRACT
  Filled 2017-05-16: qty 3

## 2017-05-16 MED ORDER — VANCOMYCIN HCL 10 G IV SOLR
2250.0000 mg | Freq: Once | INTRAVENOUS | Status: AC
  Administered 2017-05-16: 2250 mg via INTRAVENOUS
  Filled 2017-05-16: qty 2250

## 2017-05-16 MED ORDER — CEFEPIME HCL 1 G IJ SOLR: 1.0000 g | INTRAMUSCULAR | Status: DC

## 2017-05-16 MED ORDER — POLYETHYLENE GLYCOL 3350 17 G PO PACK: 17.0000 g | PACK | Freq: Every day | ORAL | Status: DC | PRN

## 2017-05-16 MED ORDER — PIPERACILLIN-TAZOBACTAM 3.375 G IVPB
3.3750 g | Freq: Three times a day (TID) | INTRAVENOUS | Status: DC
  Administered 2017-05-16 – 2017-05-20 (×11): 3.375 g via INTRAVENOUS
  Filled 2017-05-16 (×13): qty 50

## 2017-05-16 MED ORDER — SODIUM CHLORIDE 0.9 % IV BOLUS (SEPSIS)
500.0000 mL | Freq: Once | INTRAVENOUS | Status: AC
Start: 2017-05-16 — End: 2017-05-16
  Administered 2017-05-16: 500 mL via INTRAVENOUS

## 2017-05-16 MED ORDER — INSULIN ASPART 100 UNIT/ML ~~LOC~~ SOLN
0.0000 [IU] | SUBCUTANEOUS | Status: DC
  Administered 2017-05-16: 15 [IU] via SUBCUTANEOUS
  Administered 2017-05-17: 11 [IU] via SUBCUTANEOUS
  Administered 2017-05-17: 8 [IU] via SUBCUTANEOUS
  Administered 2017-05-17: 11 [IU] via SUBCUTANEOUS
  Administered 2017-05-17: 2 [IU] via SUBCUTANEOUS
  Filled 2017-05-16 (×4): qty 1

## 2017-05-16 MED ORDER — LABETALOL HCL 5 MG/ML IV SOLN
10.0000 mg | INTRAVENOUS | Status: DC | PRN
  Administered 2017-05-16 – 2017-05-19 (×10): 10 mg via INTRAVENOUS
  Filled 2017-05-16 (×10): qty 4

## 2017-05-16 MED ORDER — SODIUM CHLORIDE 0.9 % IV BOLUS (SEPSIS): 1000.0000 mL | Freq: Once | INTRAVENOUS | Status: DC

## 2017-05-16 MED ORDER — INSULIN ASPART 100 UNIT/ML ~~LOC~~ SOLN
12.0000 [IU] | Freq: Once | SUBCUTANEOUS | Status: AC
  Administered 2017-05-16: 12 [IU] via SUBCUTANEOUS
  Filled 2017-05-16: qty 1

## 2017-05-16 MED ORDER — VANCOMYCIN HCL 10 G IV SOLR
1500.0000 mg | INTRAVENOUS | Status: DC
  Administered 2017-05-18: 1500 mg via INTRAVENOUS
  Filled 2017-05-16 (×2): qty 1500

## 2017-05-16 MED ORDER — MORPHINE SULFATE (PF) 4 MG/ML IV SOLN: 2.0000 mg | INTRAVENOUS | Status: DC | PRN

## 2017-05-16 MED ORDER — HYDROMORPHONE HCL 1 MG/ML IJ SOLN
1.0000 mg | INTRAMUSCULAR | Status: DC | PRN
  Administered 2017-05-17 (×2): 1 mg via INTRAVENOUS
  Filled 2017-05-16 (×2): qty 1

## 2017-05-16 MED ORDER — DEXTROSE 5 % IV SOLN
2.0000 g | Freq: Once | INTRAVENOUS | Status: AC
  Administered 2017-05-16: 2 g via INTRAVENOUS
  Filled 2017-05-16: qty 2

## 2017-05-16 NOTE — ED Notes (Signed)
Admitting MD at bedside.

## 2017-05-16 NOTE — ED Notes (Signed)
Dr. Brett Albino returned page.  Will place an order to adm 18 units of novolog SQ once.
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
## 2017-05-16 NOTE — Progress Notes (Addendum)
Pharmacy Antibiotic Note  [name] is a 52 y.o. male admitted on 05/16/2017 with pneumonia.  Pharmacy has been consulted for vancomycin dosing.  Received one time dose of cefepime. Was recently diagnosed with PNA on Monday (treated with amoxicillin and azithromycin) with no improvement with symptoms. WBC is 15.5. LA is 1.36. Scr is elevated at 3.32 (normCrCl ~27 mL/min).   Plan: Vancomycin 2250 mg IV once  Vancomycin 1500 mg IV every 48 hours  Monitor renal fx, cx results, clinical pic, and VT as needed  Height: 5\' 7"  (170.2 cm) Weight: 255 lb (115.7 kg) IBW/kg (Calculated) : 66.1  Temp (24hrs), Avg:99.3 F (37.4 C), Min:99.3 F (37.4 C), Max:99.3 F (37.4 C)  Recent Labs  Lab 05/16/17 1119 05/16/17 1131  WBC 15.5*  --   CREATININE 3.32*  --   LATICACIDVEN  --  1.36    Estimated Creatinine Clearance: 32.3 mL/min (A) (by C-G formula based on SCr of 3.32 mg/dL (H)).    No Known Allergies  Antimicrobials this admission: Vancomycin 2/7 >>  Cefepime x1   Dose adjustments this admission: N/A  Microbiology results: 2/7 BCx: sent  Thank you for allowing pharmacy to be a part of this patient's care.  Doylene Canard, PharmD Clinical Pharmacist  Pager: [phone] Clinical Phone for 05/16/2017 until 3:30pm: [phone] If after 3:30pm, please call main pharmacy at [phone] 05/16/2017 1:27 PM    ADDENDUM Changed from cefepime to zosyn therapy. Received last dose of cefepime on 1233. Will order zosyn 3.375 g IV every 8 hours.  Doylene Canard, PharmD Clinical Pharmacist  Pager: [phone] Phone: [phone]

## 2017-05-16 NOTE — ED Notes (Signed)
Dr. Ardelia Mems paged to RN [facility] request

## 2017-05-16 NOTE — ED Notes (Signed)
Dr. Ardelia Mems aware of pt's CBG 453, hold insulin until repeat BMP drawn/resulted.

## 2017-05-16 NOTE — ED Provider Notes (Signed)
Medical screening examination/treatment/procedure(s) were conducted as a shared visit with non-physician practitioner(s) and myself.  I personally evaluated the patient during the encounter.   EKG Interpretation None     52 year old male here with shortness of breath progressively worse times 3 days.  Diagnosed with pneumonia.  Chest x-ray shows large pleural effusion.  Also has severe hyperglycemia.  Suspect infectious etiology.  Start on IV antibiotics and will be admitted to the hospitalist service   Lacretia Leigh, MD 05/16/17 1232
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
## 2017-05-16 NOTE — ED Notes (Signed)
Report attempted x 1

## 2017-05-16 NOTE — ED Notes (Signed)
Pt care assumed, obtained verbal report.  Pt's breathing is labored.  100% on 2L [place].  Pt is sitting on the side of the bed d/t diff breathing.  Admitting resident MD paged to get further order on pt's glucose of 507.

## 2017-05-16 NOTE — ED Triage Notes (Signed)
Pt is here with sob and recently diagnosed with PNA on Monday and symptoms not improving.  Pt is sob R- 24 and sats 89% RA, placed patient on 02/2L, sleepy, EMS reports his sugar is registering high on meter.  Pt complains of back and right rib area pain.

## 2017-05-17 ENCOUNTER — Inpatient Hospital Stay (HOSPITAL_COMMUNITY): Payer: Medicare Other

## 2017-05-17 ENCOUNTER — Inpatient Hospital Stay (HOSPITAL_COMMUNITY): Payer: Medicare Other | Admitting: Certified Registered Nurse Anesthetist

## 2017-05-17 ENCOUNTER — Encounter (HOSPITAL_COMMUNITY): Payer: Self-pay | Admitting: General Surgery

## 2017-05-17 ENCOUNTER — Encounter (HOSPITAL_COMMUNITY)
Admission: EM | Disposition: A | Discharge status: Home / Self Care | Payer: Self-pay | Source: Home / Self Care | Attending: Family Medicine

## 2017-05-17 DIAGNOSIS — J9601 Acute respiratory failure with hypoxia: Secondary | ICD-10-CM

## 2017-05-17 DIAGNOSIS — J90 Pleural effusion, not elsewhere classified: Secondary | ICD-10-CM | POA: Diagnosis present

## 2017-05-17 DIAGNOSIS — J869 Pyothorax without fistula: Secondary | ICD-10-CM

## 2017-05-17 HISTORY — PX: EMPYEMA DRAINAGE: SHX5097

## 2017-05-17 HISTORY — PX: IR THORACENTESIS ASP PLEURAL SPACE W/IMG GUIDE: IMG5380

## 2017-05-17 HISTORY — PX: VIDEO ASSISTED THORACOSCOPY (VATS)/EMPYEMA: SHX6172

## 2017-05-17 HISTORY — PX: THORACOTOMY/LOBECTOMY: SHX6116

## 2017-05-17 HISTORY — PX: FLEXIBLE BRONCHOSCOPY: SHX5094

## 2017-05-17 LAB — AMYLASE, PLEURAL OR PERITONEAL FLUID: AMYLASE FL: 23 U/L

## 2017-05-17 LAB — POCT I-STAT 3, ART BLOOD GAS (G3+)
Acid-base deficit: 5 mmol/L — ABNORMAL HIGH (ref 0.0–2.0)
Bicarbonate: 22.4 mmol/L (ref 20.0–28.0)
O2 Saturation: 95 %
Patient temperature: 97.6
TCO2: 24 mmol/L (ref 22–32)
pCO2 arterial: 46.6 mmHg (ref 32.0–48.0)
pH, Arterial: 7.286 — ABNORMAL LOW (ref 7.350–7.450)
pO2, Arterial: 81 mmHg — ABNORMAL LOW (ref 83.0–108.0)

## 2017-05-17 LAB — GLUCOSE, CAPILLARY
Glucose-Capillary: 167 mg/dL — ABNORMAL HIGH (ref 65–99)
Glucose-Capillary: 208 mg/dL — ABNORMAL HIGH (ref 65–99)
Glucose-Capillary: 309 mg/dL — ABNORMAL HIGH (ref 65–99)
Glucose-Capillary: 327 mg/dL — ABNORMAL HIGH (ref 65–99)
Glucose-Capillary: 344 mg/dL — ABNORMAL HIGH (ref 65–99)

## 2017-05-17 LAB — TYPE AND SCREEN
ABO/RH(D): O POS
Antibody Screen: NEGATIVE

## 2017-05-17 LAB — BODY FLUID CELL COUNT WITH DIFFERENTIAL
Eos, Fluid: 0 %
Eos, Fluid: 0 %
LYMPHS FL: 18 %
Lymphs, Fluid: 5 %
Monocyte-Macrophage-Serous Fluid: 6 % — ABNORMAL LOW (ref 50–90)
Monocyte-Macrophage-Serous Fluid: 2 % — ABNORMAL LOW (ref 50–90)
Neutrophil Count, Fluid: 76 % — ABNORMAL HIGH (ref 0–25)
Neutrophil Count, Fluid: 93 % — ABNORMAL HIGH (ref 0–25)
Total Nucleated Cell Count, Fluid: 2390 cu mm — ABNORMAL HIGH (ref 0–1000)
Total Nucleated Cell Count, Fluid: 890 cu mm (ref 0–1000)

## 2017-05-17 LAB — CBC
HCT: 36.9 % — ABNORMAL LOW (ref 39.0–52.0)
Hemoglobin: 12.8 g/dL — ABNORMAL LOW (ref 13.0–17.0)
MCH: 29.7 pg (ref 26.0–34.0)
MCHC: 34.7 g/dL (ref 30.0–36.0)
MCV: 85.6 fL (ref 78.0–100.0)
PLATELETS: 459 10*3/uL — AB (ref 150–400)
RBC: 4.31 MIL/uL (ref 4.22–5.81)
RDW: 12.5 % (ref 11.5–15.5)
WBC: 15.8 10*3/uL — AB (ref 4.0–10.5)

## 2017-05-17 LAB — GLUCOSE, PLEURAL OR PERITONEAL FLUID: Glucose, Fluid: 188 mg/dL

## 2017-05-17 LAB — PROTEIN, PLEURAL OR PERITONEAL FLUID: TOTAL PROTEIN, FLUID: 6.1 g/dL

## 2017-05-17 LAB — CBG MONITORING, ED
GLUCOSE-CAPILLARY: 198 mg/dL — AB (ref 65–99)
Glucose-Capillary: 124 mg/dL — ABNORMAL HIGH (ref 65–99)
Glucose-Capillary: 112 mg/dL — ABNORMAL HIGH (ref 65–99)
Glucose-Capillary: 276 mg/dL — ABNORMAL HIGH (ref 65–99)
Glucose-Capillary: 302 mg/dL — ABNORMAL HIGH (ref 65–99)

## 2017-05-17 LAB — ALBUMIN, PLEURAL OR PERITONEAL FLUID: Albumin, Fluid: 2.1 g/dL

## 2017-05-17 LAB — ABO/RH: ABO/RH(D): O POS

## 2017-05-17 LAB — LACTATE DEHYDROGENASE, PLEURAL OR PERITONEAL FLUID: LD FL: 1637 U/L — AB (ref 3–23)

## 2017-05-17 LAB — HEMOGLOBIN A1C
Hgb A1c MFr Bld: >15.5 % — ABNORMAL HIGH (ref 4.8–5.6)
Mean Plasma Glucose: >398 mg/dL

## 2017-05-17 LAB — HIV ANTIBODY (ROUTINE TESTING W REFLEX): HIV Screen 4th Generation wRfx: NONREACTIVE

## 2017-05-17 SURGERY — VIDEO ASSISTED THORACOSCOPY (VATS)/EMPYEMA
Anesthesia: General | Site: Chest | Laterality: Right

## 2017-05-17 MED ORDER — ACETAMINOPHEN 500 MG PO TABS
1000.0000 mg | ORAL_TABLET | Freq: Four times a day (QID) | ORAL | Status: AC
  Administered 2017-05-18 – 2017-05-22 (×15): 1000 mg via ORAL
  Filled 2017-05-17 (×16): qty 2

## 2017-05-17 MED ORDER — ONDANSETRON HCL 4 MG/2ML IJ SOLN
INTRAMUSCULAR | Status: AC
  Filled 2017-05-17: qty 2

## 2017-05-17 MED ORDER — PANTOPRAZOLE SODIUM 40 MG IV SOLR
40.0000 mg | Freq: Every day | INTRAVENOUS | Status: DC
  Administered 2017-05-18: 40 mg via INTRAVENOUS
  Filled 2017-05-17: qty 40

## 2017-05-17 MED ORDER — FENTANYL CITRATE (PF) 250 MCG/5ML IJ SOLN
INTRAMUSCULAR | Status: AC
  Filled 2017-05-17: qty 5

## 2017-05-17 MED ORDER — OXYCODONE HCL 5 MG PO TABS: 5.0000 mg | ORAL_TABLET | ORAL | Status: DC | PRN

## 2017-05-17 MED ORDER — CHLORHEXIDINE GLUCONATE 0.12% ORAL RINSE (MEDLINE KIT)
15.0000 mL | Freq: Two times a day (BID) | OROMUCOSAL | Status: DC
  Administered 2017-05-17 – 2017-05-18 (×3): 15 mL via OROMUCOSAL

## 2017-05-17 MED ORDER — ONDANSETRON HCL 4 MG/2ML IJ SOLN
INTRAMUSCULAR | Status: DC | PRN
  Administered 2017-05-17: 4 mg via INTRAVENOUS

## 2017-05-17 MED ORDER — DEXAMETHASONE SODIUM PHOSPHATE 10 MG/ML IJ SOLN
INTRAMUSCULAR | Status: AC
  Filled 2017-05-17: qty 1

## 2017-05-17 MED ORDER — LIDOCAINE HCL (CARDIAC) 20 MG/ML IV SOLN
INTRAVENOUS | Status: DC | PRN
  Administered 2017-05-17: 60 mg via INTRAVENOUS

## 2017-05-17 MED ORDER — PROPOFOL 1000 MG/100ML IV EMUL
0.0000 ug/kg/min | INTRAVENOUS | Status: DC
  Administered 2017-05-17 (×2): 50 ug/kg/min via INTRAVENOUS
  Administered 2017-05-18: 40 ug/kg/min via INTRAVENOUS
  Administered 2017-05-18: 50 ug/kg/min via INTRAVENOUS
  Filled 2017-05-17 (×4): qty 100

## 2017-05-17 MED ORDER — MIDAZOLAM HCL 2 MG/2ML IJ SOLN
INTRAMUSCULAR | Status: AC
  Administered 2017-05-17: 2 mg
  Filled 2017-05-17: qty 2

## 2017-05-17 MED ORDER — POTASSIUM CHLORIDE 10 MEQ/50ML IV SOLN: 10.0000 meq | Freq: Every day | INTRAVENOUS | Status: DC | PRN

## 2017-05-17 MED ORDER — LACTATED RINGERS IV SOLN
INTRAVENOUS | Status: DC | PRN
  Administered 2017-05-17: 15:00:00 via INTRAVENOUS

## 2017-05-17 MED ORDER — 0.9 % SODIUM CHLORIDE (POUR BTL) OPTIME
TOPICAL | Status: DC | PRN
  Administered 2017-05-17: 3000 mL

## 2017-05-17 MED ORDER — FENTANYL CITRATE (PF) 100 MCG/2ML IJ SOLN
INTRAMUSCULAR | Status: AC
  Administered 2017-05-17: 50 ug
  Filled 2017-05-17: qty 2

## 2017-05-17 MED ORDER — PROPOFOL 500 MG/50ML IV EMUL
INTRAVENOUS | Status: DC | PRN
  Administered 2017-05-17: 50 ug/kg/min via INTRAVENOUS

## 2017-05-17 MED ORDER — PROPOFOL 10 MG/ML IV BOLUS
INTRAVENOUS | Status: DC | PRN
  Administered 2017-05-17: 150 mg via INTRAVENOUS
  Administered 2017-05-17: 40 mg via INTRAVENOUS
  Administered 2017-05-17: 50 mg via INTRAVENOUS

## 2017-05-17 MED ORDER — ASPIRIN 81 MG PO CHEW
81.0000 mg | CHEWABLE_TABLET | Freq: Every day | ORAL | Status: DC
  Administered 2017-05-17 – 2017-05-20 (×4): 81 mg
  Filled 2017-05-17 (×4): qty 1

## 2017-05-17 MED ORDER — FENTANYL CITRATE (PF) 100 MCG/2ML IJ SOLN
100.0000 ug | INTRAMUSCULAR | Status: DC | PRN
  Administered 2017-05-17 – 2017-05-18 (×2): 100 ug via INTRAVENOUS
  Filled 2017-05-17 (×2): qty 2

## 2017-05-17 MED ORDER — DEXTROSE-NACL 5-0.45 % IV SOLN: INTRAVENOUS | Status: DC

## 2017-05-17 MED ORDER — FENTANYL CITRATE (PF) 100 MCG/2ML IJ SOLN
INTRAMUSCULAR | Status: DC | PRN
  Administered 2017-05-17 (×7): 50 ug via INTRAVENOUS

## 2017-05-17 MED ORDER — LEVALBUTEROL HCL 0.63 MG/3ML IN NEBU
0.6300 mg | INHALATION_SOLUTION | Freq: Four times a day (QID) | RESPIRATORY_TRACT | Status: DC
  Administered 2017-05-17 – 2017-05-18 (×4): 0.63 mg via RESPIRATORY_TRACT
  Filled 2017-05-17 (×4): qty 3

## 2017-05-17 MED ORDER — MIDAZOLAM HCL 2 MG/2ML IJ SOLN
INTRAMUSCULAR | Status: AC
  Filled 2017-05-17: qty 2

## 2017-05-17 MED ORDER — DEXAMETHASONE SODIUM PHOSPHATE 10 MG/ML IJ SOLN
INTRAMUSCULAR | Status: DC | PRN
  Administered 2017-05-17: 5 mg via INTRAVENOUS

## 2017-05-17 MED ORDER — HYDRALAZINE HCL 20 MG/ML IJ SOLN: 10.0000 mg | INTRAMUSCULAR | Status: DC | PRN

## 2017-05-17 MED ORDER — MIDAZOLAM HCL 2 MG/2ML IJ SOLN
2.0000 mg | INTRAMUSCULAR | Status: DC | PRN
  Administered 2017-05-17 (×2): 2 mg via INTRAVENOUS
  Filled 2017-05-17 (×2): qty 2

## 2017-05-17 MED ORDER — HEPARIN SODIUM (PORCINE) 5000 UNIT/ML IJ SOLN
5000.0000 [IU] | Freq: Three times a day (TID) | INTRAMUSCULAR | Status: DC
  Administered 2017-05-18 – 2017-05-22 (×14): 5000 [IU] via SUBCUTANEOUS
  Filled 2017-05-17 (×14): qty 1

## 2017-05-17 MED ORDER — LIDOCAINE 2% (20 MG/ML) 5 ML SYRINGE
INTRAMUSCULAR | Status: AC
  Filled 2017-05-17: qty 10

## 2017-05-17 MED ORDER — FENTANYL CITRATE (PF) 250 MCG/5ML IJ SOLN
INTRAMUSCULAR | Status: AC
Start: 2017-05-17 — End: 2017-05-17
  Filled 2017-05-17: qty 5

## 2017-05-17 MED ORDER — ONDANSETRON HCL 4 MG/2ML IJ SOLN: 4.0000 mg | Freq: Four times a day (QID) | INTRAMUSCULAR | Status: DC | PRN

## 2017-05-17 MED ORDER — ORAL CARE MOUTH RINSE
15.0000 mL | Freq: Four times a day (QID) | OROMUCOSAL | Status: DC
  Administered 2017-05-18 (×2): 15 mL via OROMUCOSAL

## 2017-05-17 MED ORDER — ROCURONIUM BROMIDE 100 MG/10ML IV SOLN
INTRAVENOUS | Status: DC | PRN
  Administered 2017-05-17: 50 mg via INTRAVENOUS
  Administered 2017-05-17: 20 mg via INTRAVENOUS

## 2017-05-17 MED ORDER — TRAMADOL HCL 50 MG PO TABS: 50.0000 mg | ORAL_TABLET | Freq: Four times a day (QID) | ORAL | Status: DC | PRN

## 2017-05-17 MED ORDER — CLONIDINE HCL 0.2 MG/24HR TD PTWK
0.2000 mg | MEDICATED_PATCH | TRANSDERMAL | Status: DC
  Administered 2017-05-17: 0.2 mg via TRANSDERMAL
  Filled 2017-05-17: qty 1

## 2017-05-17 MED ORDER — HYDRALAZINE HCL 20 MG/ML IJ SOLN
10.0000 mg | INTRAMUSCULAR | Status: DC | PRN
  Administered 2017-05-17: 40 mg via INTRAVENOUS
  Administered 2017-05-18 – 2017-05-19 (×5): 20 mg via INTRAVENOUS
  Administered 2017-05-20 (×2): 40 mg via INTRAVENOUS
  Filled 2017-05-17: qty 1
  Filled 2017-05-17 (×2): qty 2
  Filled 2017-05-17 (×3): qty 1
  Filled 2017-05-17 (×2): qty 2

## 2017-05-17 MED ORDER — DEXTROSE 5 % IV SOLN: 1.5000 g | INTRAVENOUS | Status: DC

## 2017-05-17 MED ORDER — PROPOFOL 10 MG/ML IV BOLUS
INTRAVENOUS | Status: AC
  Filled 2017-05-17: qty 40

## 2017-05-17 MED ORDER — LIDOCAINE HCL (PF) 2 % IJ SOLN
INTRAMUSCULAR | Status: AC | PRN
  Administered 2017-05-17: 10 mL

## 2017-05-17 MED ORDER — ACETAMINOPHEN 160 MG/5ML PO SOLN
1000.0000 mg | Freq: Four times a day (QID) | ORAL | Status: AC
  Administered 2017-05-17 – 2017-05-18 (×3): 1000 mg via ORAL
  Filled 2017-05-17 (×5): qty 40.6

## 2017-05-17 MED ORDER — SODIUM CHLORIDE 0.9 % IV SOLN
INTRAVENOUS | Status: DC
  Administered 2017-05-17: 19:00:00 via INTRAVENOUS

## 2017-05-17 MED ORDER — MORPHINE SULFATE (PF) 4 MG/ML IV SOLN
4.0000 mg | Freq: Once | INTRAVENOUS | Status: AC
  Administered 2017-05-17: 4 mg via INTRAVENOUS
  Filled 2017-05-17: qty 1

## 2017-05-17 MED ORDER — SENNOSIDES-DOCUSATE SODIUM 8.6-50 MG PO TABS
1.0000 | ORAL_TABLET | Freq: Every day | ORAL | Status: DC
  Administered 2017-05-17 – 2017-05-21 (×4): 1 via ORAL
  Filled 2017-05-17 (×5): qty 1

## 2017-05-17 MED ORDER — BISACODYL 5 MG PO TBEC
10.0000 mg | DELAYED_RELEASE_TABLET | Freq: Every day | ORAL | Status: DC
  Administered 2017-05-18 – 2017-05-21 (×4): 10 mg via ORAL
  Filled 2017-05-17 (×5): qty 2

## 2017-05-17 MED ORDER — SODIUM CHLORIDE 0.9 % IV SOLN
INTRAVENOUS | Status: DC
  Administered 2017-05-17: 2.9 [IU]/h via INTRAVENOUS
  Filled 2017-05-17 (×2): qty 1

## 2017-05-17 MED ORDER — FENTANYL CITRATE (PF) 100 MCG/2ML IJ SOLN
100.0000 ug | INTRAMUSCULAR | Status: AC | PRN
  Administered 2017-05-17 (×3): 100 ug via INTRAVENOUS
  Filled 2017-05-17 (×3): qty 2

## 2017-05-17 SURGICAL SUPPLY — 74 items
APPLICATOR TIP COSEAL (VASCULAR PRODUCTS) IMPLANT
APPLICATOR TIP EXT COSEAL (VASCULAR PRODUCTS) IMPLANT
BLADE SURG 11 STRL SS (BLADE) ×4 IMPLANT
CANISTER SUCT 3000ML PPV (MISCELLANEOUS) ×4 IMPLANT
CATH KIT ON Q 5IN SLV (PAIN MANAGEMENT) IMPLANT
CATH THORACIC 28FR (CATHETERS) IMPLANT
CATH THORACIC 36FR (CATHETERS) IMPLANT
CATH THORACIC 36FR RT ANG (CATHETERS) IMPLANT
CLEANER TIP ELECTROSURG 2X2 (MISCELLANEOUS) ×4 IMPLANT
CLIP VESOCCLUDE MED 6/CT (CLIP) IMPLANT
CONN ST 1/4X3/8  BEN (MISCELLANEOUS)
CONN ST 1/4X3/8 BEN (MISCELLANEOUS) IMPLANT
CONN Y 3/8X3/8X3/8  BEN (MISCELLANEOUS)
CONN Y 3/8X3/8X3/8 BEN (MISCELLANEOUS) IMPLANT
CONT SPEC 4OZ CLIKSEAL STRL BL (MISCELLANEOUS) ×8 IMPLANT
COVER SURGICAL LIGHT HANDLE (MISCELLANEOUS) ×4 IMPLANT
DERMABOND ADVANCED (GAUZE/BANDAGES/DRESSINGS)
DERMABOND ADVANCED .7 DNX12 (GAUZE/BANDAGES/DRESSINGS) IMPLANT
DRAIN CHANNEL 28F RND 3/8 FF (WOUND CARE) IMPLANT
DRAPE LAPAROSCOPIC ABDOMINAL (DRAPES) ×4 IMPLANT
DRAPE WARM FLUID 44X44 (DRAPE) ×4 IMPLANT
DRILL BIT 7/64X5 (BIT) IMPLANT
ELECT BLADE 4.0 EZ CLEAN MEGAD (MISCELLANEOUS) ×4
ELECT REM PT RETURN 9FT ADLT (ELECTROSURGICAL) ×4
ELECTRODE BLDE 4.0 EZ CLN MEGD (MISCELLANEOUS) ×3 IMPLANT
ELECTRODE REM PT RTRN 9FT ADLT (ELECTROSURGICAL) ×3 IMPLANT
GAUZE SPONGE 4X4 12PLY STRL (GAUZE/BANDAGES/DRESSINGS) ×4 IMPLANT
GLOVE BIO SURGEON STRL SZ 6.5 (GLOVE) ×8 IMPLANT
GLOVE BIOGEL PI IND STRL 6.5 (GLOVE) ×3 IMPLANT
GLOVE BIOGEL PI INDICATOR 6.5 (GLOVE) ×1
GLOVE SURG SS PI 6.0 STRL IVOR (GLOVE) ×4 IMPLANT
GOWN STRL REUS W/ TWL LRG LVL3 (GOWN DISPOSABLE) ×9 IMPLANT
GOWN STRL REUS W/TWL LRG LVL3 (GOWN DISPOSABLE) ×3
KIT BASIN OR (CUSTOM PROCEDURE TRAY) ×4 IMPLANT
KIT ROOM TURNOVER OR (KITS) ×4 IMPLANT
KIT SUCTION CATH 14FR (SUCTIONS) ×4 IMPLANT
NS IRRIG 1000ML POUR BTL (IV SOLUTION) ×16 IMPLANT
PACK CHEST (CUSTOM PROCEDURE TRAY) ×4 IMPLANT
PAD ARMBOARD 7.5X6 YLW CONV (MISCELLANEOUS) ×8 IMPLANT
PASSER SUT SWANSON 36MM LOOP (INSTRUMENTS) IMPLANT
SCISSORS LAP 5X35 DISP (ENDOMECHANICALS) IMPLANT
SEALANT PROGEL (MISCELLANEOUS) IMPLANT
SEALANT SURG COSEAL 4ML (VASCULAR PRODUCTS) IMPLANT
SEALANT SURG COSEAL 8ML (VASCULAR PRODUCTS) IMPLANT
SOLUTION ANTI FOG 6CC (MISCELLANEOUS) ×4 IMPLANT
SUT PROLENE 3 0 SH DA (SUTURE) IMPLANT
SUT PROLENE 4 0 RB 1 (SUTURE)
SUT PROLENE 4-0 RB1 .5 CRCL 36 (SUTURE) IMPLANT
SUT SILK  1 MH (SUTURE) ×4
SUT SILK 1 MH (SUTURE) ×12 IMPLANT
SUT SILK 1 TIES 10X30 (SUTURE) IMPLANT
SUT SILK 2 0SH CR/8 30 (SUTURE) IMPLANT
SUT SILK 3 0SH CR/8 30 (SUTURE) IMPLANT
SUT VIC AB 1 CTX 18 (SUTURE) ×4 IMPLANT
SUT VIC AB 1 CTX 36 (SUTURE)
SUT VIC AB 1 CTX36XBRD ANBCTR (SUTURE) IMPLANT
SUT VIC AB 2-0 CTX 36 (SUTURE) IMPLANT
SUT VIC AB 3-0 X1 27 (SUTURE) IMPLANT
SUT VICRYL 0 UR6 27IN ABS (SUTURE) IMPLANT
SUT VICRYL 2 TP 1 (SUTURE) IMPLANT
SWAB COLLECTION DEVICE MRSA (MISCELLANEOUS) IMPLANT
SWAB CULTURE ESWAB REG 1ML (MISCELLANEOUS) IMPLANT
SYSTEM SAHARA CHEST DRAIN ATS (WOUND CARE) ×4 IMPLANT
TAPE CLOTH 4X10 WHT NS (GAUZE/BANDAGES/DRESSINGS) ×4 IMPLANT
TAPE CLOTH SURG 4X10 WHT LF (GAUZE/BANDAGES/DRESSINGS) ×4 IMPLANT
TAPE UMBILICAL COTTON 1/8X30 (MISCELLANEOUS) ×4 IMPLANT
TIP APPLICATOR SPRAY EXTEND 16 (VASCULAR PRODUCTS) IMPLANT
TOWEL GREEN STERILE (TOWEL DISPOSABLE) ×4 IMPLANT
TOWEL GREEN STERILE FF (TOWEL DISPOSABLE) ×4 IMPLANT
TRAP SPECIMEN MUCOUS 40CC (MISCELLANEOUS) IMPLANT
TRAY FOLEY CATH SILVER 16FR LF (SET/KITS/TRAYS/PACK) ×4 IMPLANT
TROCAR BLADELESS 12MM (ENDOMECHANICALS) ×4 IMPLANT
TUNNELER SHEATH ON-Q 11GX8 DSP (PAIN MANAGEMENT) IMPLANT
WATER STERILE IRR 1000ML POUR (IV SOLUTION) ×8 IMPLANT

## 2017-05-17 NOTE — Anesthesia Preprocedure Evaluation (Addendum)
Anesthesia Evaluation  Patient identified by MRN, date of birth, ID band Patient awake    Reviewed: Allergy & Precautions, H&P , NPO status , Patient's Chart, lab work & pertinent test results  Airway Mallampati: II   Neck ROM: full    Dental   Pulmonary sleep apnea ,    breath sounds clear to auscultation       Cardiovascular hypertension, Pt. on home beta blockers  Rhythm:regular Rate:Normal     Neuro/Psych PSYCHIATRIC DISORDERS Anxiety negative neurological ROS     GI/Hepatic negative GI ROS, Neg liver ROS,   Endo/Other  diabetes, Type 2, Insulin Dependent  Renal/GU Renal InsufficiencyRenal disease  negative genitourinary   Musculoskeletal negative musculoskeletal ROS (+)   Abdominal   Peds  Hematology negative hematology ROS (+)   Anesthesia Other Findings   Reproductive/Obstetrics                            Anesthesia Physical Anesthesia Plan  ASA: III  Anesthesia Plan: General   Post-op Pain Management:    Induction: Intravenous  PONV Risk Score and Plan: 3 and Ondansetron, Dexamethasone and Midazolam  Airway Management Planned: Double Lumen EBT  Additional Equipment: Arterial line, CVP and Ultrasound Guidance Line Placement  Intra-op Plan:   Post-operative Plan: Extubation in OR  Informed Consent: I have reviewed the patients History and Physical, chart, labs and discussed the procedure including the risks, benefits and alternatives for the proposed anesthesia with the patient or authorized representative who has indicated his/her understanding and acceptance.     Plan Discussed with: CRNA, Anesthesiologist and Surgeon  Anesthesia Plan Comments:        Anesthesia Quick Evaluation

## 2017-05-17 NOTE — ED Notes (Signed)
Admitting paged d/t increased RR rate

## 2017-05-17 NOTE — Transfer of Care (Signed)
Immediate Anesthesia Transfer of Care Note  Patient: [name]  Procedure(s) Performed: RIGHT VIDEO ASSISTED THORACOSCOPY (VATS)/EMPYEMA (Right Chest) FLEXIBLE BRONCHOSCOPY EMPYEMA DRAINAGE AND DECORTICATION (Right ) RIGHT MINI THORACOTOMY (Right Chest)  Patient Location: SICU  Anesthesia Type:General  Level of Consciousness: Patient remains intubated per anesthesia plan  Airway & Oxygen Therapy: Patient remains intubated per anesthesia plan and Patient placed on Ventilator (see vital sign flow sheet for setting)  Post-op Assessment: Report given to RN and Post -op Vital signs reviewed and stable  Post vital signs: Reviewed and stable  Last Vitals:  Vitals:   05/17/17 1227 05/17/17 1245  BP: (!) 166/112 (!) 169/106  Pulse: 87 91  Resp: 20 19  Temp:    SpO2: 96% 94%    Last Pain:  Vitals:   05/17/17 1302  TempSrc:   PainSc: 7          Complications: No apparent anesthesia complications

## 2017-05-17 NOTE — Anesthesia Procedure Notes (Signed)
Arterial Line Insertion Start/End2/11/2017 2:40 PM, 05/17/2017 2:45 PM Performed by: CRNA  Patient location: Pre-op. Preanesthetic checklist: patient identified, IV checked, site marked, risks and benefits discussed, surgical consent, monitors and equipment checked, pre-op evaluation, timeout performed and anesthesia consent Lidocaine 1% used for infiltration Left, radial was placed Catheter size: 20 G Hand hygiene performed , maximum sterile barriers used  and Seldinger technique used Allen's test indicative of satisfactory collateral circulation Attempts: 1 Procedure performed without using ultrasound guided technique. Following insertion, dressing applied and Biopatch. Post procedure assessment: normal and unchanged  Patient tolerated the procedure well with no immediate complications.

## 2017-05-17 NOTE — Anesthesia Procedure Notes (Signed)
Central Venous Catheter Insertion Performed by: Albertha Ghee, MD, anesthesiologist Start/End2/11/2017 2:40 PM, 05/17/2017 2:50 PM Patient location: Pre-op. Preanesthetic checklist: patient identified, IV checked, site marked, risks and benefits discussed, surgical consent, monitors and equipment checked, pre-op evaluation, timeout performed and anesthesia consent Lidocaine 1% used for infiltration and patient sedated Hand hygiene performed  and maximum sterile barriers used  Catheter size: 8 Fr Total catheter length 16. Central line was placed.Double lumen Procedure performed using ultrasound guided technique. Ultrasound Notes:image(s) printed for medical record Attempts: 1 Following insertion, dressing applied and line sutured. Post procedure assessment: blood return through all ports  Patient tolerated the procedure well with no immediate complications.
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
## 2017-05-17 NOTE — Progress Notes (Signed)
Patient ID: [name], male   DOB: [birth year], 52 y.o.   MRN: [id number] EVENING ROUNDS NOTE :     [address]       [place] [address]             [phone]                 Day of Surgery Procedure(s) (LRB): RIGHT VIDEO ASSISTED THORACOSCOPY (VATS)/EMPYEMA (Right) FLEXIBLE BRONCHOSCOPY EMPYEMA DRAINAGE AND DECORTICATION (Right) RIGHT MINI THORACOTOMY (Right)  Total Length of Stay:  LOS: 1 day  BP (!) 189/96   Pulse 95   Temp 98 F (36.7 C) (Oral)   Resp 20   Ht _0  (1.702 m)   Wt 255 lb (115.7 kg)   SpO2 93%   BMI 39.94 kg/m   .Intake/Output      02/08 0701 - 02/09 0700   I.V. (mL/kg) 734.7 (6.4)   IV Piggyback    Total Intake(mL/kg) 734.7 (6.4)   Urine (mL/kg/hr) 585 (0.4)   Blood 450   Chest Tube 170   Total Output 1205   Net -470.3         . sodium chloride 50 mL/hr at 05/17/17 1836  . piperacillin-tazobactam (ZOSYN)  IV 3.375 g (05/17/17 2030)  . propofol (DIPRIVAN) infusion 50 mcg/kg/min (05/17/17 1930)  . [START ON 05/18/2017] vancomycin       Lab Results  Component Value Date   WBC 15.8 (H) 05/17/2017   HGB 12.8 (L) 05/17/2017   HCT 36.9 (L) 05/17/2017   PLT 459 (H) 05/17/2017   GLUCOSE 507 (HH) 05/16/2017   ALT 17 05/16/2017   AST 17 05/16/2017   NA 124 (L) 05/16/2017   K 4.7 05/16/2017   CL 90 (L) 05/16/2017   CREATININE 3.19 (H) 05/16/2017   BUN 48 (H) 05/16/2017   CO2 20 (L) 05/16/2017   TSH 1.060 12/29/2014   INR 1.14 05/16/2017   HGBA1C >15.5 (H) 05/16/2017   sedated on vent  Not much chest tube drainage  Glucose 300-500 may need insulin drip if not stablized quickly   Grace Isaac MD  Beeper [phone] Office [phone] 05/17/2017 9:02 PM

## 2017-05-17 NOTE — Brief Op Note (Signed)
05/16/2017 - 05/17/2017  4:45 PM  PATIENT:  [name]  52 y.o. male  PRE-OPERATIVE DIAGNOSIS:  right loculated effusion  POST-OPERATIVE DIAGNOSIS:  right loculated effusion  PROCEDURE:  Procedure(s):  RIGHT VIDEO ASSISTED THORACOSCOPY/MINI THORACOTOMY -Drainage of Empyema -Decortication  FLEXIBLE BRONCHOSCOPY   SURGEON:  Surgeon(s) and Role:    * Grace Isaac, MD - Primary  PHYSICIAN ASSISTANT: Ellwood Handler PA-C  ANESTHESIA:   general  EBL:  450 mL   BLOOD ADMINISTERED:none  DRAINS: 32 Blake Drain x 2   LOCAL MEDICATIONS USED:  NONE  SPECIMEN:  Source of Specimen:  Pleural Fluid, Pleural Peel  DISPOSITION OF SPECIMEN:  Microbiology, Pathology  COUNTS:  YES  TOURNIQUET:  * No tourniquets in log *  DICTATION: .Dragon Dictation  PLAN OF CARE: Admit to inpatient   PATIENT DISPOSITION:  ICU - intubated and hemodynamically stable.   Delay start of Pharmacological VTE agent (>24hrs) due to surgical blood loss or risk of bleeding: yes
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
## 2017-05-17 NOTE — ED Notes (Signed)
This Rn called to Radiology d/t pt reports R Sided cp post IR thoracentesis, Campos, MD accompanied RN to Xray, pt sent to CT with nurse, pt placed on cardiac monitoring, pt had 4 beat run of V tach on monitor in CT, pt reports increased SOB & has labored breathing, tachypneic, Charge RN aware, pt moved to Trauma Room C, report given to [place], Therapist, sports

## 2017-05-17 NOTE — Progress Notes (Signed)
PT Cancellation Note  Patient Details Name: [name] MRN: [id number] DOB: 10-02-1965   Cancelled Treatment:    Reason Eval/Treat Not Completed: Patient not medically ready Per RN notes, pt with increased SOB and labored breathing and moved to a trauma room. Will hold off until medically stable and follow up as schedule allows.   Leighton Ruff, PT, DPT  Acute Rehabilitation Services  Pager: [phone]  Rudean Hitt 05/17/2017, 10:31 AM

## 2017-05-17 NOTE — ED Notes (Signed)
[place], MD aware of pts respiratory status, plans to round on pt, will continue to monitor

## 2017-05-17 NOTE — Anesthesia Procedure Notes (Signed)
Procedure Name: Intubation Date/Time: 05/17/2017 3:17 PM Performed by: Colin Benton, CRNA Pre-anesthesia Checklist: Patient identified, Emergency Drugs available, Suction available and Patient being monitored Patient Re-evaluated:Patient Re-evaluated prior to induction Oxygen Delivery Method: Circle system utilized Preoxygenation: Pre-oxygenation with 100% oxygen Induction Type: IV induction and Rapid sequence Ventilation: Mask ventilation without difficulty Laryngoscope Size: Mac and 3 Grade View: Grade II Tube type: Oral Endobronchial tube: Left, Double lumen EBT, EBT position confirmed by auscultation and EBT position confirmed by fiberoptic bronchoscope and 39 Fr Number of attempts: 1 Airway Equipment and Method: Stylet and Fiberoptic brochoscope Placement Confirmation: ETT inserted through vocal cords under direct vision,  positive ETCO2 and breath sounds checked- equal and bilateral Secured at: 31 cm Tube secured with: Tape Dental Injury: Teeth and Oropharynx as per pre-operative assessment

## 2017-05-17 NOTE — Procedures (Signed)
Ultrasound-guided diagnostic and therapeutic right thoracentesis performed yielding 18CC of serous colored fluid. No immediate complications. Follow-up chest x-ray pending.   [name] 10:04 AM 05/17/2017
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
## 2017-05-17 NOTE — ED Notes (Signed)
IR RN given Lobetalol 10 mg for HTN post procedure depending on parameters, IR RN aware of BP prior to thoracentesis

## 2017-05-17 NOTE — ED Notes (Signed)
Ky Barban, MD at bedside

## 2017-05-17 NOTE — ED Notes (Signed)
[place] [id number] RN

## 2017-05-18 ENCOUNTER — Inpatient Hospital Stay (HOSPITAL_COMMUNITY): Payer: Medicare Other

## 2017-05-18 DIAGNOSIS — J90 Pleural effusion, not elsewhere classified: Secondary | ICD-10-CM

## 2017-05-18 DIAGNOSIS — J9601 Acute respiratory failure with hypoxia: Secondary | ICD-10-CM

## 2017-05-18 DIAGNOSIS — J869 Pyothorax without fistula: Principal | ICD-10-CM

## 2017-05-18 DIAGNOSIS — J189 Pneumonia, unspecified organism: Secondary | ICD-10-CM

## 2017-05-18 LAB — GLUCOSE, BODY FLUID OTHER: Glucose, Body Fluid Other: 247 mg/dL

## 2017-05-18 LAB — PROTEIN, BODY FLUID (OTHER): Total Protein, Body Fluid Other: 6.2 g/dL

## 2017-05-18 LAB — GLUCOSE, CAPILLARY
Glucose-Capillary: 330 mg/dL — ABNORMAL HIGH (ref 65–99)
Glucose-Capillary: 278 mg/dL — ABNORMAL HIGH (ref 65–99)
Glucose-Capillary: 266 mg/dL — ABNORMAL HIGH (ref 65–99)
Glucose-Capillary: 227 mg/dL — ABNORMAL HIGH (ref 65–99)
Glucose-Capillary: 206 mg/dL — ABNORMAL HIGH (ref 65–99)
Glucose-Capillary: 124 mg/dL — ABNORMAL HIGH (ref 65–99)
Glucose-Capillary: 128 mg/dL — ABNORMAL HIGH (ref 65–99)
Glucose-Capillary: 135 mg/dL — ABNORMAL HIGH (ref 65–99)
Glucose-Capillary: 144 mg/dL — ABNORMAL HIGH (ref 65–99)
Glucose-Capillary: 289 mg/dL — ABNORMAL HIGH (ref 65–99)
Glucose-Capillary: 293 mg/dL — ABNORMAL HIGH (ref 65–99)
Glucose-Capillary: 302 mg/dL — ABNORMAL HIGH (ref 65–99)

## 2017-05-18 LAB — CBC
HEMATOCRIT: 33.2 % — AB (ref 39.0–52.0)
HEMOGLOBIN: 11 g/dL — AB (ref 13.0–17.0)
MCH: 28.9 pg (ref 26.0–34.0)
MCHC: 33.1 g/dL (ref 30.0–36.0)
MCV: 87.1 fL (ref 78.0–100.0)
Platelets: 453 10*3/uL — ABNORMAL HIGH (ref 150–400)
RBC: 3.81 MIL/uL — ABNORMAL LOW (ref 4.22–5.81)
RDW: 13.2 % (ref 11.5–15.5)
WBC: 15.6 10*3/uL — ABNORMAL HIGH (ref 4.0–10.5)

## 2017-05-18 LAB — BASIC METABOLIC PANEL
Anion gap: 14 (ref 5–15)
BUN: 51 mg/dL — ABNORMAL HIGH (ref 6–20)
CALCIUM: 8.9 mg/dL (ref 8.9–10.3)
CHLORIDE: 99 mmol/L — AB (ref 101–111)
CO2: 18 mmol/L — AB (ref 22–32)
CREATININE: 3 mg/dL — AB (ref 0.61–1.24)
GFR calc non Af Amer: 23 mL/min — ABNORMAL LOW (ref >60)
GFR, EST AFRICAN AMERICAN: 26 mL/min — AB (ref >60)
GLUCOSE: 293 mg/dL — AB (ref 65–99)
Potassium: 4.6 mmol/L (ref 3.5–5.1)
Sodium: 131 mmol/L — ABNORMAL LOW (ref 135–145)

## 2017-05-18 LAB — MRSA PCR SCREENING: MRSA by PCR: NEGATIVE

## 2017-05-18 LAB — TRIGLYCERIDES, BODY FLUIDS: TRIGLYCERIDES FL: 118 mg/dL

## 2017-05-18 LAB — LD, BODY FLUID (OTHER): LD, Body Fluid: 933 IU/L

## 2017-05-18 LAB — MAGNESIUM: MAGNESIUM: 2.7 mg/dL — AB (ref 1.7–2.4)

## 2017-05-18 LAB — PHOSPHORUS: Phosphorus: 5.2 mg/dL — ABNORMAL HIGH (ref 2.5–4.6)

## 2017-05-18 MED ORDER — INSULIN ASPART 100 UNIT/ML ~~LOC~~ SOLN
0.0000 [IU] | Freq: Every day | SUBCUTANEOUS | Status: DC
  Administered 2017-05-18: 4 [IU] via SUBCUTANEOUS
  Administered 2017-05-19: 3 [IU] via SUBCUTANEOUS

## 2017-05-18 MED ORDER — GUAIFENESIN ER 600 MG PO TB12
600.0000 mg | ORAL_TABLET | Freq: Two times a day (BID) | ORAL | Status: DC
  Administered 2017-05-18 – 2017-05-22 (×9): 600 mg via ORAL
  Filled 2017-05-18 (×9): qty 1

## 2017-05-18 MED ORDER — FENTANYL CITRATE (PF) 100 MCG/2ML IJ SOLN
25.0000 ug | INTRAMUSCULAR | Status: DC | PRN
  Administered 2017-05-18: 50 ug via INTRAVENOUS
  Administered 2017-05-18 (×3): 100 ug via INTRAVENOUS
  Filled 2017-05-18 (×5): qty 2

## 2017-05-18 MED ORDER — TRAMADOL HCL 50 MG PO TABS
50.0000 mg | ORAL_TABLET | Freq: Three times a day (TID) | ORAL | Status: DC | PRN
  Administered 2017-05-18 – 2017-05-20 (×4): 50 mg via ORAL
  Filled 2017-05-18 (×4): qty 1

## 2017-05-18 MED ORDER — LEVALBUTEROL HCL 0.63 MG/3ML IN NEBU
0.6300 mg | INHALATION_SOLUTION | Freq: Three times a day (TID) | RESPIRATORY_TRACT | Status: DC
  Administered 2017-05-18 – 2017-05-20 (×6): 0.63 mg via RESPIRATORY_TRACT
  Filled 2017-05-18 (×7): qty 3

## 2017-05-18 MED ORDER — FENTANYL CITRATE (PF) 100 MCG/2ML IJ SOLN
25.0000 ug | INTRAMUSCULAR | Status: DC | PRN
  Administered 2017-05-19 – 2017-05-21 (×5): 50 ug via INTRAVENOUS
  Filled 2017-05-18 (×5): qty 2

## 2017-05-18 MED ORDER — INSULIN ASPART 100 UNIT/ML ~~LOC~~ SOLN
0.0000 [IU] | Freq: Three times a day (TID) | SUBCUTANEOUS | Status: DC
  Administered 2017-05-18: 11 [IU] via SUBCUTANEOUS
  Administered 2017-05-19: 7 [IU] via SUBCUTANEOUS
  Administered 2017-05-19 – 2017-05-20 (×4): 11 [IU] via SUBCUTANEOUS
  Administered 2017-05-20: 15 [IU] via SUBCUTANEOUS
  Administered 2017-05-21 (×2): 7 [IU] via SUBCUTANEOUS
  Administered 2017-05-21: 11 [IU] via SUBCUTANEOUS
  Administered 2017-05-22: 4 [IU] via SUBCUTANEOUS
  Administered 2017-05-22: 7 [IU] via SUBCUTANEOUS

## 2017-05-18 MED ORDER — INSULIN GLARGINE 100 UNIT/ML ~~LOC~~ SOLN
6.0000 [IU] | Freq: Every day | SUBCUTANEOUS | Status: DC
  Administered 2017-05-18 – 2017-05-19 (×2): 6 [IU] via SUBCUTANEOUS
  Filled 2017-05-18 (×2): qty 0.06

## 2017-05-18 NOTE — Progress Notes (Addendum)
Visited patient in room.  He is alert.  Still intubated.  Per CCM, patient is going to have trial next patient today.  Will likely be returning to family practice teaching service tomorrow, pending improved respiratory status.  Greatly appreciate the care provided by CCM.

## 2017-05-18 NOTE — Progress Notes (Signed)
PT Cancellation Note  Patient Details Name: [name] MRN: [id number] DOB: 09-May-1965   Cancelled Treatment:    Reason Eval/Treat Not Completed: Patient not medically ready; patient intubated/sedated.  Will attempt another day.   Reginia Naas 05/18/2017, 8:46 AM  Magda Kiel, [place] 05/18/2017

## 2017-05-18 NOTE — Procedures (Signed)
Extubation Procedure Note  Patient Details:   Name: [name] DOB: [birth year] MRN: [id number]   Airway Documentation:     Evaluation  O2 sats: stable throughout Complications: No apparent complications Patient did tolerate procedure well. Bilateral Breath Sounds: Diminished, Other (Comment)(coarse)   Yes  4l/min [place] Incentive spirometer instructed.  Revonda Standard 05/18/2017, 10:20 AM

## 2017-05-18 NOTE — Progress Notes (Signed)
Patient ID: Mliss Fritz Petrich, male   DOB: [birth year], 52 y.o.   MRN: [id number] EVENING ROUNDS NOTE :     [address]       [place] [address]             [phone]                 1 Day Post-Op Procedure(s) (LRB): RIGHT VIDEO ASSISTED THORACOSCOPY (VATS)/EMPYEMA (Right) FLEXIBLE BRONCHOSCOPY EMPYEMA DRAINAGE AND DECORTICATION (Right) RIGHT MINI THORACOTOMY (Right)  Total Length of Stay:  LOS: 2 days  BP (!) 148/85   Pulse 93   Temp 97.6 F (36.4 C) (Oral)   Resp 18   Ht _0  (1.702 m)   Wt 246 lb 4.1 oz (111.7 kg)   SpO2 96%   BMI 38.57 kg/m   .Intake/Output      02/09 0701 - 02/10 0700   P[address]   I.V. (mL/kg) 864.7 (7.7)   NG/GT    IV Piggyback 550   Total Intake(mL/kg) 1894.7 (17)   Urine (mL/kg/hr) 1230 (0.9)   Blood    Chest Tube 110   Total Output 1340   Net +554.7         . sodium chloride 50 mL/hr at 05/17/17 1900  . piperacillin-tazobactam (ZOSYN)  IV Stopped (05/18/17 1652)  . vancomycin Stopped (05/18/17 1642)     Lab Results  Component Value Date   WBC 15.6 (H) 05/18/2017   HGB 11.0 (L) 05/18/2017   HCT 33.2 (L) 05/18/2017   PLT 453 (H) 05/18/2017   GLUCOSE 293 (H) 05/18/2017   ALT 17 05/16/2017   AST 17 05/16/2017   NA 131 (L) 05/18/2017   K 4.6 05/18/2017   CL 99 (L) 05/18/2017   CREATININE 3.00 (H) 05/18/2017   BUN 51 (H) 05/18/2017   CO2 18 (L) 05/18/2017   TSH 1.060 12/29/2014   INR 1.14 05/16/2017   HGBA1C >15.5 (H) 05/16/2017   Chronic Kidney Disease   Stage I     GFR >90  Stage II    GFR 60-89  Stage IIIA GFR 45-59  Stage IIIB GFR 30-44  Stage IV   GFR 15-29  Stage V    GFR  <15  Lab Results  Component Value Date   CREATININE 3.00 (H) 05/18/2017   Estimated Creatinine Clearance: 35.1 mL/min (A) (by C-G formula based on SCr of 3 mg/dL (H)).  Now extubated  HGBA1C >15.5  Grace Isaac MD  [place] [phone] Office [phone] 05/18/2017 7:52 PM
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
## 2017-05-18 NOTE — Anesthesia Procedure Notes (Signed)
Procedure Name: Intubation Date/Time: 05/17/2017 5:10 PM Performed by: Inda Coke, CRNA Pre-anesthesia Checklist: Patient identified, Emergency Drugs available, Suction available and Patient being monitored Patient Re-evaluated:Patient Re-evaluated prior to induction Oxygen Delivery Method: Circle System Utilized Preoxygenation: Pre-oxygenation with 100% oxygen Induction Type: IV induction Ventilation: Mask ventilation without difficulty Laryngoscope Size: Glidescope and 4 Grade View: Grade I Tube type: Subglottic suction tube Tube size: 8.0 mm Number of attempts: 1 Airway Equipment and Method: Stylet,  Oral airway and Bougie stylet Placement Confirmation: ETT inserted through vocal cords under direct vision,  positive ETCO2 and breath sounds checked- equal and bilateral Secured at: 22 cm Tube secured with: Tape Dental Injury: Teeth and Oropharynx as per pre-operative assessment  Comments: Exchanged DLT with subglottic ETT using glidescope for visualization and cook catheter for exchange. Procedure uneventful and smooth

## 2017-05-18 NOTE — Anesthesia Postprocedure Evaluation (Addendum)
Anesthesia Post Note  Patient: [name]  Procedure(s) Performed: RIGHT VIDEO ASSISTED THORACOSCOPY (VATS)/EMPYEMA (Right Chest) FLEXIBLE BRONCHOSCOPY EMPYEMA DRAINAGE AND DECORTICATION (Right ) RIGHT MINI THORACOTOMY (Right Chest)     Patient location during evaluation: SICU Anesthesia Type: General Level of consciousness: sedated Pain management: pain level controlled Vital Signs Assessment: post-procedure vital signs reviewed and stable Respiratory status: patient remains intubated per anesthesia plan Cardiovascular status: stable Postop Assessment: no apparent nausea or vomiting Anesthetic complications: no    Last Vitals:  Vitals:   05/18/17 0700 05/18/17 0715  BP: (!) 153/105 124/87  Pulse:    Resp: 19 18  Temp:    SpO2: 97% 99%    Last Pain:  Vitals:   05/18/17 0313  TempSrc: Axillary  PainSc:                  [place] S

## 2017-05-18 NOTE — Progress Notes (Signed)
1 Day Post-Op Procedure(s) (LRB): RIGHT VIDEO ASSISTED THORACOSCOPY (VATS)/EMPYEMA (Right) FLEXIBLE BRONCHOSCOPY EMPYEMA DRAINAGE AND DECORTICATION (Right) RIGHT MINI THORACOTOMY (Right) Subjective: Just extubated Breathing comfortably cxr improved no air leak  Objective: Vital signs in last 24 hours: Temp:  [97.1 F (36.2 C)-97.8 F (36.6 C)] 97.8 F (36.6 C) (02/09 1100) Pulse Rate:  [87-101] 93 (02/09 0311) Cardiac Rhythm: Normal sinus rhythm (02/09 1100) Resp:  [15-33] 16 (02/09 1100) BP: (106-189)/(59-112) 143/93 (02/09 1100) SpO2:  [92 %-99 %] 94 % (02/09 1100) Arterial Line BP: (154-172)/(69-89) 154/74 (02/08 2000) FiO2 (%):  [40 %-50 %] 40 % (02/09 1000) Weight:  [246 lb 4.1 oz (111.7 kg)] 246 lb 4.1 oz (111.7 kg) (02/09 0500)  Hemodynamic parameters for last 24 hours:  nsr  Intake/Output from previous day: 02/08 0701 - 02/09 0700 In: 2237.9 [I.V.:1507.9; NG/GT:480; IV Piggyback:250] Out: 1790 [Urine:1060; Blood:450; Chest Tube:280] Intake/Output this shift: Total I/O In: 564.7 [I.V.:564.7] Out: 495 [Urine:425; Chest Tube:70]  lungs clear  Lab Results: Recent Labs    05/17/17 0421 05/18/17 0235  WBC 15.8* 15.6*  HGB 12.8* 11.0*  HCT 36.9* 33.2*  PLT 459* 453*   BMET:  Recent Labs    05/16/17 1550 05/18/17 0235  NA 124* 131*  K 4.7 4.6  CL 90* 99*  CO2 20* 18*  GLUCOSE 507* 293*  BUN 48* 51*  CREATININE 3.19* 3.00*  CALCIUM 9.2 8.9    PT/INR:  Recent Labs    05/16/17 1119  LABPROT 14.5  INR 1.14   ABG    Component Value Date/Time   PHART 7.286 (L) 05/17/2017 1817   HCO3 22.4 05/17/2017 1817   TCO2 24 05/17/2017 1817   ACIDBASEDEF 5.0 (H) 05/17/2017 1817   O2SAT 95.0 05/17/2017 1817   CBG (last 3)  Recent Labs    05/18/17 0302 05/18/17 0356 05/18/17 0504  GLUCAP 266* 227* 206*    Assessment/Plan: S/P Procedure(s) (LRB): RIGHT VIDEO ASSISTED THORACOSCOPY (VATS)/EMPYEMA (Right) FLEXIBLE BRONCHOSCOPY EMPYEMA DRAINAGE AND  DECORTICATION (Right) RIGHT MINI THORACOTOMY (Right) Mobilize Chest tubes to suction   LOS: 2 days    [name] 05/18/2017

## 2017-05-18 NOTE — Addendum Note (Signed)
Addendum  created 05/18/17 0815 by Inda Coke, CRNA   Child order released for a procedure order, Intraprocedure Blocks edited, [place] created via procedure documentation, Sign clinical note

## 2017-05-19 ENCOUNTER — Inpatient Hospital Stay (HOSPITAL_COMMUNITY): Payer: Medicare Other

## 2017-05-19 DIAGNOSIS — I361 Nonrheumatic tricuspid (valve) insufficiency: Secondary | ICD-10-CM

## 2017-05-19 DIAGNOSIS — R739 Hyperglycemia, unspecified: Secondary | ICD-10-CM

## 2017-05-19 DIAGNOSIS — I10 Essential (primary) hypertension: Secondary | ICD-10-CM

## 2017-05-19 DIAGNOSIS — Z794 Long term (current) use of insulin: Secondary | ICD-10-CM

## 2017-05-19 DIAGNOSIS — G8918 Other acute postprocedural pain: Secondary | ICD-10-CM

## 2017-05-19 DIAGNOSIS — N179 Acute kidney failure, unspecified: Secondary | ICD-10-CM

## 2017-05-19 DIAGNOSIS — Z9689 Presence of other specified functional implants: Secondary | ICD-10-CM

## 2017-05-19 DIAGNOSIS — E1165 Type 2 diabetes mellitus with hyperglycemia: Secondary | ICD-10-CM

## 2017-05-19 DIAGNOSIS — J869 Pyothorax without fistula: Secondary | ICD-10-CM

## 2017-05-19 LAB — ECHOCARDIOGRAM COMPLETE
AOASC: 34 cm
Area-P 1/2: 3.93 cm2
E decel time: 190 msec
E/e' ratio: 11.24
FS: 43 % (ref 28–44)
Height: 67 in
IVS/LV PW RATIO, ED: 1.03
LA vol index: 39.4 mL/m2
LADIAMINDEX: 1.83 cm/m2
LASIZE: 43 mm
LAVOL: 92.5 mL
LAVOLA4C: 87.2 mL
LEFT ATRIUM END SYS DIAM: 43 mm
LV E/e' medial: 11.24
LV E/e'average: 11.24
LV PW d: 13.5 mm — AB (ref 0.6–1.1)
LVELAT: 7.4 cm/s
Lateral S' vel: 16.5 cm/s
MV Dec: 190
MV Peak grad: 3 mmHg
MV pk E vel: 83.2 m/s
MVPKAVEL: 157 m/s
MVSPHT: 56 ms
Reg peak vel: 366 cm/s
TAPSE: 23.9 mm
TDI e' lateral: 7.4
TDI e' medial: 5.11
TR max vel: 366 cm/s
WEIGHTICAEL: 3943.59 [oz_av]

## 2017-05-19 LAB — COMPREHENSIVE METABOLIC PANEL
ALBUMIN: 2 g/dL — AB (ref 3.5–5.0)
ALT: 17 U/L (ref 17–63)
ANION GAP: 13 (ref 5–15)
AST: 19 U/L (ref 15–41)
Alkaline Phosphatase: 172 U/L — ABNORMAL HIGH (ref 38–126)
BUN: 45 mg/dL — ABNORMAL HIGH (ref 6–20)
CHLORIDE: 102 mmol/L (ref 101–111)
CO2: 19 mmol/L — AB (ref 22–32)
CREATININE: 2.65 mg/dL — AB (ref 0.61–1.24)
Calcium: 8.7 mg/dL — ABNORMAL LOW (ref 8.9–10.3)
GFR calc Af Amer: 31 mL/min — ABNORMAL LOW (ref >60)
GFR calc non Af Amer: 26 mL/min — ABNORMAL LOW (ref >60)
Glucose, Bld: 272 mg/dL — ABNORMAL HIGH (ref 65–99)
Potassium: 4.3 mmol/L (ref 3.5–5.1)
SODIUM: 134 mmol/L — AB (ref 135–145)
Total Bilirubin: 0.6 mg/dL (ref 0.3–1.2)
Total Protein: 7.3 g/dL (ref 6.5–8.1)

## 2017-05-19 LAB — GLUCOSE, CAPILLARY
Glucose-Capillary: 244 mg/dL — ABNORMAL HIGH (ref 65–99)
Glucose-Capillary: 295 mg/dL — ABNORMAL HIGH (ref 65–99)
Glucose-Capillary: 204 mg/dL — ABNORMAL HIGH (ref 65–99)
Glucose-Capillary: 290 mg/dL — ABNORMAL HIGH (ref 65–99)

## 2017-05-19 LAB — CBC
HCT: 34.4 % — ABNORMAL LOW (ref 39.0–52.0)
Hemoglobin: 11.4 g/dL — ABNORMAL LOW (ref 13.0–17.0)
MCH: 29.1 pg (ref 26.0–34.0)
MCHC: 33.1 g/dL (ref 30.0–36.0)
MCV: 87.8 fL (ref 78.0–100.0)
PLATELETS: 507 10*3/uL — AB (ref 150–400)
RBC: 3.92 MIL/uL — AB (ref 4.22–5.81)
RDW: 13.5 % (ref 11.5–15.5)
WBC: 14.2 10*3/uL — AB (ref 4.0–10.5)

## 2017-05-19 LAB — PH, BODY FLUID: PH, BODY FLUID: 7.4

## 2017-05-19 LAB — PHOSPHORUS: Phosphorus: 4.4 mg/dL (ref 2.5–4.6)

## 2017-05-19 LAB — MAGNESIUM: Magnesium: 2.3 mg/dL (ref 1.7–2.4)

## 2017-05-19 MED ORDER — AMLODIPINE BESYLATE 10 MG PO TABS
10.0000 mg | ORAL_TABLET | Freq: Every day | ORAL | Status: DC
  Administered 2017-05-19 – 2017-05-22 (×4): 10 mg via ORAL
  Filled 2017-05-19 (×4): qty 1

## 2017-05-19 MED ORDER — GABAPENTIN 300 MG PO CAPS
300.0000 mg | ORAL_CAPSULE | Freq: Two times a day (BID) | ORAL | Status: DC
  Administered 2017-05-19 – 2017-05-22 (×7): 300 mg via ORAL
  Filled 2017-05-19 (×7): qty 1

## 2017-05-19 MED ORDER — INSULIN GLARGINE 100 UNIT/ML ~~LOC~~ SOLN
10.0000 [IU] | Freq: Every day | SUBCUTANEOUS | Status: DC
  Filled 2017-05-19: qty 0.1

## 2017-05-19 MED ORDER — LABETALOL HCL 5 MG/ML IV SOLN
20.0000 mg | INTRAVENOUS | Status: DC | PRN
  Administered 2017-05-20 – 2017-05-21 (×4): 20 mg via INTRAVENOUS
  Filled 2017-05-19 (×4): qty 4

## 2017-05-19 NOTE — Progress Notes (Signed)
Family Medicine Teaching Service Daily Progress Note Intern Pager: [phone]  Patient name: [name] Medical record number: [id number] Date of birth: [birth year] Age: 52 y.o. Gender: male  Primary Care Provider: Bartholome Bill, MD Consultants: CCM, TCTS Code Status: Full  Pt Overview and Major Events to Date: [name] is a 52 y.o. male presenting with sided empyema status post VATS. PMH is significant for T2DM, HTN, and CKD IV, OSA, mixed anxiety and depression mood,  2/8-Thoracentesis 2/8-VATS with decortication 2/8-remained intubated in ICU 2/9-extubated 2/10-FPTS became primary team  Assessment and Plan:  Right-sided empyema Chest x-ray this a.m. shows no interval change from prior status post surgery.  Patient was initiated on broad-spectrum antibiotics Zosyn and vancomycin thoracentesis on 2/7 with only 18 cc drainage.  TCTS was consulted and VATS was performed on 2/8.  Patient remained in ICU care intubated after procedure.  He was extubated on 2/9 and care was transferred back to family practice teaching service with stepdown care on 2/10.  Patient has right-sided chest tube in place chest tube in place.  Has a central line -Appreciate TCTS recommendations -Continue Zosyn and vancomycin -Daily CBC, BMP -Fentanyl, Tylenol, and tramadol for pain -Bowel regimen: Colace, Senokot and MiraLAX  -Xopenex nebulizer every 6 hours as needed -Aspirin 81 mg -Mucinex -PT/OT -Strict I's and O's  AK I on CKD WF:[id number]  Cr on admission 3.32. >> 2.65 on 2/10.  Baseline ~2.8-3 - continue to monitor - Renally dose when appropriate and avoid nephrotoxic drugs  T2DM, uncontrolled A1c this admission > 15.5. Reports taking 7 units of Lantus at night at home. Initially had CBG on admission 553 and AG 16, improved with Novolog.  Close the following day with overnight CBG elevated to 502 with AG 14 and came down to 355 with 18u Novolog.  He has been on moderate sliding scale  insulin and receiving 6 units of Lantus at night.   - rSSI -Increase Lantus to 10 units in p.m. -Monitor CBGs every 4 hours  HTN: Patient has history of resistant HTN; unclear on home meds, but some combination of amlodipine 10mg , clonidine 0.1mg  TID, furosemide 120mg  daily, spironolactone 25mg  BID, labetalol 400 BID, hydralazine 50mg  TID. Resume PO antihypertensives in stepwise fashion -Continue labetalol 10 mg as needed >160/> 110 -Hydralazine PRN as needed >160/> 110 -0.2 mg clonidine patch in place -Restart amlodipine 10 mg  Concern for CHF: Patient reports history of HF, although no mention in records. BNP on admission wnl. Echo 12/2014 with moderate LV concentric hypertrophy and EF 55-50%, otherwise wnl.   - Echo completed; f/u results  FEN/GI: Heart healthy diet PPx: Heparin  Disposition: Inpatient pending improved respiratory status  Subjective:  Feels that his pain is better.  Feels that his breathing is improved.  Objective: Temp:  [97.5 F (36.4 C)-98 F (36.7 C)] 98 F (36.7 C) (02/10 0700) Resp:  [15-27] 23 (02/10 0800) BP: (139-184)/(84-109) 164/103 (02/10 0800) SpO2:  [93 %-99 %] 96 % (02/10 0800) FiO2 (%):  [40 %] 40 % (02/09 1000) Weight:  [246 lb 7.6 oz (111.8 kg)] 246 lb 7.6 oz (111.8 kg) (02/10 0600) Physical Exam: General: Sitting up in chair, eating breakfast Cardiovascular: Regular rate and rhythm, hypertensive to 160/110, chest tube in place on right side, bandaged with no bleeding through at surgical site Respiratory: 2 L nasal cannula, diminished right breath sounds right lower lung, remainder of lung fields clear Abdomen: Nontender nondistended Extremities: SCDs in place, no lower extremity edema  Laboratory: Recent Labs  Lab 05/17/17 0421 05/18/17 0235 05/19/17 0444  WBC 15.8* 15.6* 14.2*  HGB 12.8* 11.0* 11.4*  HCT 36.9* 33.2* 34.4*  PLT 459* 453* 507*   Recent Labs  Lab 05/16/17 1119 05/16/17 1550 05/18/17 0235 05/19/17 0444  NA  121* 124* 131* 134*  K 5.2* 4.7 4.6 4.3  CL 86* 90* 99* 102  CO2 19* 20* 18* 19*  BUN 48* 48* 51* 45*  CREATININE 3.32* 3.19* 3.00* 2.65*  CALCIUM 9.5 9.2 8.9 8.7*  PROT 8.4*  --   --  7.3  BILITOT 0.7  --   --  0.6  ALKPHOS 153*  --   --  172*  ALT 17  --   --  17  AST 17  --   --  19  GLUCOSE 584* 507* 293* 272*      Imaging/Diagnostic Tests:  Bonnita Hollow, MD 05/19/2017, 9:29 AM PGY-1, [place] Intern pager: [phone], text pages welcome
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
## 2017-05-19 NOTE — Op Note (Signed)
NAME:  CORDCulley, Hedeen NO.:  [id number]  MEDICAL RECORD NO.:  [id number]  LOCATION:  2H17C                        FACILITY:  [facility]  PHYSICIAN:  Lanelle Bal, MD    DATE OF BIRTH:  [birth year]  DATE OF PROCEDURE:  05/17/2017 DATE OF DISCHARGE:                              OPERATIVE REPORT   PREOPERATIVE DIAGNOSIS:  Right empyema.  POSTOPERATIVE DIAGNOSIS:  Right empyema.  SURGICAL PROCEDURE:  Bronchoscopy, right video-assisted thoracoscopy, mini thoracotomy, evacuation of empyema, and decortication.  SURGEON:  Lanelle Bal, MD.  FIRST ASSISTANT:  Ellwood Handler, PA.  BRIEF HISTORY:  The patient is a 52 year old male with severe poorly controlled diabetes, hemoglobin A1c of greater than 15, and chronic renal failure with elevated creatinine approximately 3, who presented the day before surgery to the emergency room.  There were no beds available in the hospital.  He stayed in the emergency room.  Cardiac surgery was consulted the day after admission after attempted thoracentesis was unsuccessful and a CT scan was ultimately performed that demonstrated significant empyema on the right.  The patient had elevated white count, had been feeling poorly for previous 4-5 days. The patient was quickly evaluated and CT scan reviewed, was recommended to him that we proceed with quick evacuation of the empyema with the thoracentesis unsuccessful, placement of a chest tube would not be fruitful.  These issues were discussed with the patient and he was agreeable and signed informed consent to proceed with bronchoscopy and drainage of empyema on the right.  DESCRIPTION OF PROCEDURE:  With central line in place and the arterial line in place, the patient underwent general endotracheal anesthesia with a double-lumen endotracheal tube.  Appropriate time-out was performed and we proceeded with bronchoscopy through the double-lumen endotracheal tube to the subsegmental  level on the right and left tracheobronchial tree.  Bronchial washings of the right lower lobe were obtained and submitted, labeled as such for culture.  The double-lumen endotracheal tube was in good position.  The patient was then turned to the lateral decubitus position with the right side up.  The right chest was prepped with Betadine and draped in sterile manner.  The right side had previously preoperatively been marked and a second time-out was performed confirming laterality.  A small incision was made and carried down to the chest wall intercostal muscles.  A port was placed; however, there was significant congealed empyema present.  The incision was enlarged slightly and we began evacuating the congealed material.  There was no frank pus present.  Cultures were obtained as well as pathology on the material removed.  As we proceeded with decortication and evacuation of the empyema, the 30-degree scope was used to clear the right chest cavity as much as possible.  Two Blake drains were then left in place and brought out through separate sites on the chest wall.  The incision was then closed with interrupted 0 Vicryl, running 3-0 Vicryl in subcutaneous tissue, and a 3-0 subcuticular stitch in skin edges. Estimated blood loss approximately 250 mL.  The patient did not require any blood bank blood products.  Because of the patient's increased work of breathing preoperatively, it was decided not to  extubate him immediately, but transfer him to the surgical intensive care unit to ensure his stability and then work on weaning the ventilator.  Sponge and needle count were reported as correct at completion of procedure.     Lanelle Bal, MD     EG/MEDQ  D:  05/19/2017  T:  05/19/2017  Job:  [id number]

## 2017-05-19 NOTE — Progress Notes (Signed)
  Echocardiogram 2D Echocardiogram has been performed.  Gearlene Godsil G Garner Dullea 05/19/2017, 9:17 AM

## 2017-05-19 NOTE — Progress Notes (Signed)
Patient ID: [name], male   DOB: 30-Jan-1966, 52 y.o.   MRN: [id number] TCTS DAILY ICU PROGRESS NOTE                   [address]            [place] [address]          [phone]   2 Days Post-Op Procedure(s) (LRB): RIGHT VIDEO ASSISTED THORACOSCOPY (VATS)/EMPYEMA (Right) FLEXIBLE BRONCHOSCOPY EMPYEMA DRAINAGE AND DECORTICATION (Right) RIGHT MINI THORACOTOMY (Right)  Total Length of Stay:  LOS: 3 days   Subjective: Patient up to chair, ambulated in the hall some  Objective: Vital signs in last 24 hours: Temp:  [97.5 F (36.4 C)-98 F (36.7 C)] 98 F (36.7 C) (02/10 0700) Cardiac Rhythm: Sinus tachycardia (02/10 0800) Resp:  [15-25] 25 (02/10 1000) BP: (139-205)/(84-112) 205/112 (02/10 0900) SpO2:  [93 %-99 %] 96 % (02/10 0900) Weight:  [246 lb 7.6 oz (111.8 kg)] 246 lb 7.6 oz (111.8 kg) (02/10 0600)  Filed Weights   05/16/17 1208 05/18/17 0500 05/19/17 0600  Weight: 255 lb (115.7 kg) 246 lb 4.1 oz (111.7 kg) 246 lb 7.6 oz (111.8 kg)    Weight change: 3.5 oz (0.1 kg)   Hemodynamic parameters for last 24 hours:    Intake/Output from previous day: 02/09 0701 - 02/10 0700 In: 2544.7 [P.O.:480; I.V.:1464.7; IV Piggyback:600] Out: 2280 [Urine:2080; Chest Tube:200]  Intake/Output this shift: Total I/O In: 200 [I.V.:200] Out: -   Current Meds: Scheduled Meds: . acetaminophen  1,000 mg Oral Q6H   Or  . acetaminophen (TYLENOL) oral liquid 160 mg/5 mL  1,000 mg Oral Q6H  . amLODipine  10 mg Oral Daily  . aspirin  81 mg Per Tube Daily  . bisacodyl  10 mg Oral Daily  . cloNIDine  0.2 mg Transdermal Weekly  . gabapentin  300 mg Oral BID  . guaiFENesin  600 mg Oral BID  . heparin injection (subcutaneous)  5,000 Units Subcutaneous Q8H  . insulin aspart  0-20 Units Subcutaneous TID WC  . insulin aspart  0-5 Units Subcutaneous QHS  . [START ON 05/20/2017] insulin glargine  10 Units Subcutaneous Daily  . levalbuterol  0.63 mg Nebulization TID  .  senna-docusate  1 tablet Oral QHS   Continuous Infusions: . piperacillin-tazobactam (ZOSYN)  IV Stopped (05/19/17 0800)  . vancomycin Stopped (05/18/17 1642)   PRN Meds:.fentaNYL (SUBLIMAZE) injection, hydrALAZINE, labetalol, ondansetron (ZOFRAN) IV, polyethylene glycol, traMADol  General appearance: alert, cooperative, appears older than stated age and no distress Neurologic: intact Heart: regular rate and rhythm, S1, S2 normal, no murmur, click, rub or gallop Lungs: diminished breath sounds RLL Abdomen: soft, non-tender; bowel sounds normal; no masses,  no organomegaly Extremities: extremities normal, atraumatic, no cyanosis or edema Wound: No air leak from chest tubes  Lab Results: CBC: Recent Labs    05/18/17 0235 05/19/17 0444  WBC 15.6* 14.2*  HGB 11.0* 11.4*  HCT 33.2* 34.4*  PLT 453* 507*   BMET:  Recent Labs    05/18/17 0235 05/19/17 0444  NA 131* 134*  K 4.6 4.3  CL 99* 102  CO2 18* 19*  GLUCOSE 293* 272*  BUN 51* 45*  CREATININE 3.00* 2.65*  CALCIUM 8.9 8.7*    CMET: Lab Results  Component Value Date   WBC 14.2 (H) 05/19/2017   HGB 11.4 (L) 05/19/2017   HCT 34.4 (L) 05/19/2017   PLT 507 (H) 05/19/2017   GLUCOSE 272 (H) 05/19/2017  ALT 17 05/19/2017   AST 19 05/19/2017   NA 134 (L) 05/19/2017   K 4.3 05/19/2017   CL 102 05/19/2017   CREATININE 2.65 (H) 05/19/2017   BUN 45 (H) 05/19/2017   CO2 19 (L) 05/19/2017   TSH 1.060 12/29/2014   INR 1.14 05/16/2017   HGBA1C >15.5 (H) 05/16/2017      PT/INR:  Recent Labs    05/16/17 1119  LABPROT 14.5  INR 1.14   Radiology: Dg Chest Port 1 View  Result Date: 05/19/2017 CLINICAL DATA:  Chest tubes EXAM: PORTABLE CHEST 1 VIEW COMPARISON:  05/18/2017 FINDINGS: Interval removal of NG tube. Right chest tube is and right central line are unchanged. No pneumothorax. Small to moderate right pleural effusion is stable with patchy airspace disease in both lower lobes, right greater than left, unchanged.  Mild cardiomegaly. IMPRESSION: No significant change since prior study.  No pneumothorax. Electronically Signed   By: Rolm Baptise M.D.   On: 05/19/2017 07:15     Assessment/Plan: S/P Procedure(s) (LRB): RIGHT VIDEO ASSISTED THORACOSCOPY (VATS)/EMPYEMA (Right) FLEXIBLE BRONCHOSCOPY EMPYEMA DRAINAGE AND DECORTICATION (Right) RIGHT MINI THORACOTOMY (Right) Mobilize Chest tube to waterseal, leave chest tubes in place Chest x-ray shows better aeration right lung Glucose still poorly controlled, will ask inpatient diabetes coordinator to see the patient Referral to outpatient diabetes education Medical care per CCM and medical service  05/19/2017 11:03 AM

## 2017-05-20 ENCOUNTER — Other Ambulatory Visit: Payer: Self-pay

## 2017-05-20 ENCOUNTER — Encounter (HOSPITAL_COMMUNITY): Payer: Self-pay | Admitting: Cardiothoracic Surgery

## 2017-05-20 ENCOUNTER — Inpatient Hospital Stay (HOSPITAL_COMMUNITY): Payer: Medicare Other

## 2017-05-20 DIAGNOSIS — I5033 Acute on chronic diastolic (congestive) heart failure: Secondary | ICD-10-CM

## 2017-05-20 DIAGNOSIS — E119 Type 2 diabetes mellitus without complications: Secondary | ICD-10-CM

## 2017-05-20 DIAGNOSIS — I5031 Acute diastolic (congestive) heart failure: Secondary | ICD-10-CM

## 2017-05-20 DIAGNOSIS — G9340 Encephalopathy, unspecified: Secondary | ICD-10-CM

## 2017-05-20 DIAGNOSIS — E1169 Type 2 diabetes mellitus with other specified complication: Secondary | ICD-10-CM

## 2017-05-20 DIAGNOSIS — E871 Hypo-osmolality and hyponatremia: Secondary | ICD-10-CM

## 2017-05-20 DIAGNOSIS — N184 Chronic kidney disease, stage 4 (severe): Secondary | ICD-10-CM

## 2017-05-20 DIAGNOSIS — J90 Pleural effusion, not elsewhere classified: Secondary | ICD-10-CM

## 2017-05-20 DIAGNOSIS — I10 Essential (primary) hypertension: Secondary | ICD-10-CM

## 2017-05-20 DIAGNOSIS — R6510 Systemic inflammatory response syndrome (SIRS) of non-infectious origin without acute organ dysfunction: Secondary | ICD-10-CM

## 2017-05-20 DIAGNOSIS — J189 Pneumonia, unspecified organism: Secondary | ICD-10-CM

## 2017-05-20 DIAGNOSIS — G8918 Other acute postprocedural pain: Secondary | ICD-10-CM

## 2017-05-20 LAB — BODY FLUID CULTURE: CULTURE: NO GROWTH

## 2017-05-20 LAB — BASIC METABOLIC PANEL
Anion gap: 13 (ref 5–15)
BUN: 39 mg/dL — ABNORMAL HIGH (ref 6–20)
CHLORIDE: 100 mmol/L — AB (ref 101–111)
CO2: 20 mmol/L — ABNORMAL LOW (ref 22–32)
Calcium: 8.9 mg/dL (ref 8.9–10.3)
Creatinine, Ser: 2.3 mg/dL — ABNORMAL HIGH (ref 0.61–1.24)
GFR calc non Af Amer: 31 mL/min — ABNORMAL LOW (ref >60)
GFR, EST AFRICAN AMERICAN: 36 mL/min — AB (ref >60)
Glucose, Bld: 274 mg/dL — ABNORMAL HIGH (ref 65–99)
POTASSIUM: 4.1 mmol/L (ref 3.5–5.1)
SODIUM: 133 mmol/L — AB (ref 135–145)

## 2017-05-20 LAB — AEROBIC CULTURE W GRAM STAIN (SUPERFICIAL SPECIMEN)
Culture: NO GROWTH
Gram Stain: NONE SEEN

## 2017-05-20 LAB — GLUCOSE, CAPILLARY
Glucose-Capillary: 349 mg/dL — ABNORMAL HIGH (ref 65–99)
Glucose-Capillary: 163 mg/dL — ABNORMAL HIGH (ref 65–99)
Glucose-Capillary: 126 mg/dL — ABNORMAL HIGH (ref 65–99)
Glucose-Capillary: 258 mg/dL — ABNORMAL HIGH (ref 65–99)
Glucose-Capillary: 308 mg/dL — ABNORMAL HIGH (ref 65–99)
Glucose-Capillary: 259 mg/dL — ABNORMAL HIGH (ref 65–99)
Glucose-Capillary: 156 mg/dL — ABNORMAL HIGH (ref 65–99)

## 2017-05-20 LAB — TROPONIN I
Troponin I: 0.04 ng/mL (ref <0.03)
Troponin I: 0.05 ng/mL (ref <0.03)

## 2017-05-20 LAB — AEROBIC CULTURE  (SUPERFICIAL SPECIMEN): CULTURE: NO GROWTH

## 2017-05-20 LAB — CBC
HCT: 35.9 % — ABNORMAL LOW (ref 39.0–52.0)
HEMOGLOBIN: 12.1 g/dL — AB (ref 13.0–17.0)
MCH: 29.4 pg (ref 26.0–34.0)
MCHC: 33.7 g/dL (ref 30.0–36.0)
MCV: 87.1 fL (ref 78.0–100.0)
Platelets: 549 10*3/uL — ABNORMAL HIGH (ref 150–400)
RBC: 4.12 MIL/uL — AB (ref 4.22–5.81)
RDW: 13.6 % (ref 11.5–15.5)
WBC: 13.2 10*3/uL — AB (ref 4.0–10.5)

## 2017-05-20 MED ORDER — CLONIDINE HCL 0.2 MG PO TABS
0.2000 mg | ORAL_TABLET | Freq: Three times a day (TID) | ORAL | Status: DC
  Administered 2017-05-20 – 2017-05-22 (×6): 0.2 mg via ORAL
  Filled 2017-05-20 (×6): qty 1

## 2017-05-20 MED ORDER — SPIRONOLACTONE 25 MG PO TABS
25.0000 mg | ORAL_TABLET | Freq: Two times a day (BID) | ORAL | Status: DC
  Administered 2017-05-20 – 2017-05-21 (×4): 25 mg via ORAL
  Filled 2017-05-20 (×4): qty 1

## 2017-05-20 MED ORDER — ASPIRIN 81 MG PO CHEW
81.0000 mg | CHEWABLE_TABLET | Freq: Every day | ORAL | Status: DC
  Administered 2017-05-21 – 2017-05-22 (×2): 81 mg via ORAL
  Filled 2017-05-20 (×2): qty 1

## 2017-05-20 MED ORDER — CLONIDINE HCL 0.1 MG PO TABS
0.1000 mg | ORAL_TABLET | Freq: Three times a day (TID) | ORAL | Status: DC
  Administered 2017-05-20 (×2): 0.1 mg via ORAL
  Filled 2017-05-20 (×2): qty 1

## 2017-05-20 MED ORDER — SODIUM CHLORIDE 0.9 % IV SOLN
1.0000 g | INTRAVENOUS | Status: DC
  Administered 2017-05-20 – 2017-05-21 (×2): 1 g via INTRAVENOUS
  Filled 2017-05-20 (×3): qty 10

## 2017-05-20 MED ORDER — SODIUM CHLORIDE 0.9 % IV SOLN
500.0000 mg | INTRAVENOUS | Status: DC
  Administered 2017-05-20 – 2017-05-22 (×3): 500 mg via INTRAVENOUS
  Filled 2017-05-20 (×3): qty 500

## 2017-05-20 MED ORDER — INSULIN GLARGINE 100 UNIT/ML ~~LOC~~ SOLN
20.0000 [IU] | Freq: Every day | SUBCUTANEOUS | Status: DC
  Administered 2017-05-21 – 2017-05-22 (×2): 20 [IU] via SUBCUTANEOUS
  Filled 2017-05-20 (×2): qty 0.2

## 2017-05-20 MED ORDER — INSULIN GLARGINE 100 UNIT/ML ~~LOC~~ SOLN
13.0000 [IU] | Freq: Every day | SUBCUTANEOUS | Status: DC
  Administered 2017-05-20: 13 [IU] via SUBCUTANEOUS
  Filled 2017-05-20: qty 0.13

## 2017-05-20 NOTE — Plan of Care (Signed)
  Progressing Clinical Measurements: Respiratory complications will improve 05/20/2017 0353 - Progressing by Peggye Pitt, RN Cardiovascular complication will be avoided 05/20/2017 0353 - Progressing by Peggye Pitt, RN Activity: Risk for activity intolerance will decrease 05/20/2017 0353 - Progressing by Peggye Pitt, RN   Not Progressing Health Behavior/Discharge Planning: Ability to manage health-related needs will improve 05/20/2017 0353 - Not Progressing by Peggye Pitt, RN Nutrition: Adequate nutrition will be maintained 05/20/2017 0353 - Not Progressing by Peggye Pitt, RN   Completed/Met Education: Knowledge of General Education information will improve 05/20/2017 0353 - Completed/Met by Peggye Pitt, RN
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
## 2017-05-20 NOTE — Progress Notes (Addendum)
Inpatient Rehabilitation  Per PT request, patient was screened by Kirstine Jacquin for appropriateness for an Inpatient Acute Rehab consult.  At this time we are recommending an Inpatient Rehab consult.  Please order if you are agreeable.    Aydan Phoenix, M.A., CCC/SLP Admission Coordinator  [facility]  Cell [phone]  

## 2017-05-20 NOTE — Progress Notes (Signed)
Inpatient Diabetes Program Recommendations  AACE/ADA: New Consensus Statement on Inpatient Glycemic Control (2015)  Target Ranges:  Prepandial:   less than 140 mg/dL      Peak postprandial:   less than 180 mg/dL (1-2 hours)      Critically ill patients:  140 - 180 mg/dL   Lab Results  Component Value Date   GLUCAP 258 (H) 05/20/2017   HGBA1C >15.5 (H) 05/16/2017    Review of Glycemic ControlResults for [name], [name] (MRN [id number]) as of 05/20/2017 10:51  Ref. Range 05/19/2017 08:03 05/19/2017 11:45 05/19/2017 17:09 05/19/2017 22:31 05/20/2017 06:39  Glucose-Capillary Latest Ref Range: 65 - 99 mg/dL 244 (H) 295 (H) 204 (H) 290 (H) 258 (H)   Diabetes history: Type 2 DM Outpatient Diabetes medications: Lantus 16 units daily, Novolog Current orders for Inpatient glycemic control:  Lantus 13 units daily,  Novolog resistant tid with meals  Inpatient Diabetes Program Recommendations:    Please increasing Lantus to 22 units daily.  Also please add Novolog meal coverage 4 units tid with meals.  Will see patient to discuss A1C.    Thanks,  Adah Perl, RN, BC-ADM Inpatient Diabetes Coordinator Pager [phone] (8a-5p)

## 2017-05-20 NOTE — Progress Notes (Addendum)
[address]       [place] [address]             [phone]      3 Days Post-Op Procedure(s) (LRB): RIGHT VIDEO ASSISTED THORACOSCOPY (VATS)/EMPYEMA (Right) FLEXIBLE BRONCHOSCOPY EMPYEMA DRAINAGE AND DECORTICATION (Right) RIGHT MINI THORACOTOMY (Right) Subjective: States that he is a little more short of breath this morning but does overall feel better. Patient is asking for orange juice but I said no since his blood glucose level is 258.   Objective: Vital signs in last 24 hours: Temp:  [97.6 F (36.4 C)-98.6 F (37 C)] 97.6 F (36.4 C) (02/11 0410) Pulse Rate:  [92-105] 99 (02/11 0410) Cardiac Rhythm: Normal sinus rhythm;Sinus tachycardia (02/10 2100) Resp:  [16-25] 20 (02/11 0410) BP: (161-205)/(101-131) 193/120 (02/11 0619) SpO2:  [93 %-97 %] 97 % (02/11 0410) Weight:  [243 lb 9.6 oz (110.5 kg)] 243 lb 9.6 oz (110.5 kg) (02/11 0401)     Intake/Output from previous day: 02/10 0701 - 02/11 0700 In: 461.4 [P.O.:120; I.V.:291.4; IV Piggyback:50] Out: 990 [Urine:950; Chest Tube:40] Intake/Output this shift: No intake/output data recorded.  General appearance: alert, cooperative and no distress Heart: sinus tachycardia Lungs: clear to auscultation bilaterally and diminshed breath sounds in the LL Abdomen: soft, non-tender; bowel sounds normal; no masses,  no organomegaly Extremities: 2+ non pitting edema in hands and feet Wound: clean and dry  Lab Results: Recent Labs    05/19/17 0444 05/20/17 0516  WBC 14.2* 13.2*  HGB 11.4* 12.1*  HCT 34.4* 35.9*  PLT 507* 549*   BMET:  Recent Labs    05/19/17 0444 05/20/17 0516  NA 134* 133*  K 4.3 4.1  CL 102 100*  CO2 19* 20*  GLUCOSE 272* 274*  BUN 45* 39*  CREATININE 2.65* 2.30*  CALCIUM 8.7* 8.9    PT/INR: No results for input(s): LABPROT, INR in the last 72 hours. ABG    Component Value Date/Time   PHART 7.286 (L) 05/17/2017 1817   HCO3 22.4 05/17/2017 1817   TCO2 24 05/17/2017  1817   ACIDBASEDEF 5.0 (H) 05/17/2017 1817   O2SAT 95.0 05/17/2017 1817   CBG (last 3)  Recent Labs    05/19/17 1709 05/19/17 2231 05/20/17 0639  GLUCAP 204* 290* 258*    Assessment/Plan: S/P Procedure(s) (LRB): RIGHT VIDEO ASSISTED THORACOSCOPY (VATS)/EMPYEMA (Right) FLEXIBLE BRONCHOSCOPY EMPYEMA DRAINAGE AND DECORTICATION (Right) RIGHT MINI THORACOTOMY (Right)  1. CV-BP remains uncontrolled with SBP of 200. On his home Amlodipine 58m daily, and Clonidine 0.186mTID. He remains on Hydralazine 10-4018m4hours PRN and Labetalol 65m9m q2Hours. ST in the 110s.  2. Pulm-tolerating 2L of oxygen with good oxygen saturation. CXR stable, will await official read.  3. Renal-CKD, creatinine 2.30. Electrolytes okay.  4. Endo-Blood glucose uncontrolled. Increase Lantus. Continue SSI. Inpatient diabetes coordinator consulted.  5. H and H is stable.  6. Medical care per attending  Plan: May need to return to the ICU for tighter blood pressure control. BP this morning is 185/110. Requiring IV labetalol and IV hydralazine. Chest tube is on water seal without an air leak. Incisions healing well.     LOS: 4 days    TessElgie Collard1/2019  Will d/c on chest tube today Request Dr YaucLillia Carmelreturn to see patient with elevated BP requireing frequent iv medications for treatment I have seen and examined [name] and agree with the above assessment  and plan.  EdwaGrace Isaac  Beeper [phone] Office [phone] 05/20/2017 4:44 PM

## 2017-05-20 NOTE — Progress Notes (Signed)
Paged resident and intern regarding patients blood pressure management as patient has not fully resumed home blood pressure medication regimen. Spoke with [place] who stated they will discuss with nephrology prior to resuming meds. Gave me correct number to page intern for future questions today. [phone].
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
## 2017-05-20 NOTE — Care Management Important Message (Signed)
Important Message  Patient Details  Name: [name] MRN: [id number] Date of Birth: 1965/09/08   Medicare Important Message Given:       Orbie Pyo 05/20/2017, 1:19 PM

## 2017-05-20 NOTE — Progress Notes (Signed)
Family Medicine Teaching Service Daily Progress Note Intern Pager: [phone]  Patient name: [name] Medical record number: [id number] Date of birth: 1966-02-04 Age: 52 y.o. Gender: male  Primary Care Provider: Bartholome Bill, MD Consultants: CCM, TCTS Code Status: Full  Pt Overview and Major Events to Date: [name] is a 52 y.o. male presenting with R-sided complicated parapneumonic effusion s/p VATS 2/8. PMH is significant for T2DM, HTN, CKD IV, OSA, mixed anxiety and depression mood disorder.  2/8: thoracentesis 2/8: VATS with decortication 2/8: remained intubated in ICU 2/9: extubated 2/10: FPTS became primary team 2/11: transition vanc/zosyn to CTX/azithro  Assessment and Plan: R-Sided Complicated Parapneumonic Effusion  Leukocytosis: Pt s/p VATS 2/8, extubated on 2/9. CXR today with slight improvement in R-sided aeration w remaining pleural fluid and airspace disease, no pneumothorax. Labs consistent with exudative effusion, likely parapneumonic. Patient w neg cultures and improving leukocytosis. - TCTS following, appreciate reccs - Chest tubes in place, to waterseal; no evidence of air leak this AM - Narrow to IV CTX + PO azithro (2/11- ), s/p IV vanc/zosyn (2/7-2/11) - Micro:    --anaerobic pleural fluid culture, no growth at 5 days   --bronchial lavage culture no growth at 2 days,    --aerobic pleural fluid culture no growth at 2 days; gram stain nl   --blood cultures no growth at 3 days   --pleural fluid fungal, AFB pending - Daily CBC, BMP - Fentanyl, Tylenol, and tramadol for pain - Bowel regimen: Colace, Senokot and MiraLAX  - Xopenex nebulizer Q6H prn, mucinex - Aspirin 81 mg - PT/OT - Strict I/O's  HTN: Patient has history of resistant HTN; unclear on home meds, but some combination of amlodipine 10mg , clonidine 0.1mg  TID, furosemide 120mg  daily, spironolactone 25mg  BID, labetalol 400mg  BID, hydralazine 50mg  TID. BP difficult to manage inpatient with  systolic consistently [phone] despite 7 prn doses of antihypertensives over last 24 hrs.This morning, pt c/o new-onset chest pain and SOB in setting of continually elevated BP and new tachycardia; EKG unchanged and trop 0.04 (unlikely PE as ppx w heparin) - Consult nephrology, appreciate recommendations - trend trop - Continue labetalol 20 mg PRN BP >160/> 110 - Hydralazine PRN BP >160/> 110 - Continue amlodipine 10 mg - On home clonidine 0.1 mg TID, consider increase to 0.2 mgTID - Start spironolactone 25mg  BID  AKI on CKD JH:ERDEYCXK; Cr on admission 3.32. >> 2.30 on 2/11.  Unclear baseline but possibly ~2.8-3 - Nephrology consult as above - Renally dose when appropriate and avoid nephrotoxic drugs  T2DM, uncontrolled A1c this admission > 15.5; required addt'l 22u novolog yesterday.  - rSSI - increase to 20u lantus from 13u - nutrition, diabetic education referral - gabapentin 300mg  BID   FEN/GI: Heart healthy diet PPx: Heparin  Disposition: Inpatient pending improved respiratory status  Subjective:  Pt complains of new-onset chest and R arm pain this morning that he attributes to the tightness of the blood pressure cuff. He also endorses mild SOB this morning that has improved. Most of his pain is related to his chest tube site. Otherwise, denies N/V, diaphoresis. Had a bowel movement this morning.   Objective: Temp:  [97.6 F (36.4 C)-98.6 F (37 C)] 97.6 F (36.4 C) (02/11 0410) Pulse Rate:  [92-117] 117 (02/11 0650) Resp:  [14-25] 14 (02/11 0650) BP: (161-205)/(101-131) 185/110 (02/11 0650) SpO2:  [93 %-97 %] 97 % (02/11 0841) Weight:  [243 lb 9.6 oz (110.5 kg)] 243 lb 9.6 oz (110.5 kg) (02/11 0401) Physical Exam:  General: Sitting up in chair, eating breakfast Cardiovascular: Tachycardic, hypertensive to 270J/500X systolic. Chest tube in place on right side, bandaged with no bleeding through at surgical site.  Respiratory: Normal WOB on 2 L nasal cannula, satting 98%.  Diminished breath sounds at middle and lower lobes of R lung, nl breath sounds at R apex and L anterior lung field. Chest tubes in place to waterseal without evidence of air leak.  Abdomen: Nontender nondistended, normoactive bowel sounds.  Extremities: No SCDs in place, no lower extremity edema.   Laboratory: Recent Labs  Lab 05/18/17 0235 05/19/17 0444 05/20/17 0516  WBC 15.6* 14.2* 13.2*  HGB 11.0* 11.4* 12.1*  HCT 33.2* 34.4* 35.9*  PLT 453* 507* 549*   Recent Labs  Lab 05/16/17 1119  05/18/17 0235 05/19/17 0444 05/20/17 0516  NA 121*   < > 131* 134* 133*  K 5.2*   < > 4.6 4.3 4.1  CL 86*   < > 99* 102 100*  CO2 19*   < > 18* 19* 20*  BUN 48*   < > 51* 45* 39*  CREATININE 3.32*   < > 3.00* 2.65* 2.30*  CALCIUM 9.5   < > 8.9 8.7* 8.9  PROT 8.4*  --   --  7.3  --   BILITOT 0.7  --   --  0.6  --   ALKPHOS 153*  --   --  172*  --   ALT 17  --   --  17  --   AST 17  --   --  19  --   GLUCOSE 584*   < > 293* 272* 274*   < > = values in this interval not displayed.    Imaging/Diagnostic Tests: Dg Chest Port 1 View  Result Date: 05/20/2017 CLINICAL DATA:  Right-sided chest tube in place. Diabetes. Hypertension. EXAM: PORTABLE CHEST 1 VIEW COMPARISON:  05/19/2017 FINDINGS: Numerous leads and wires project over the chest. Right internal jugular line tip at mid to high SVC. There is 1 and a probable second right-sided chest tube, similar in position. Midline trachea. Cardiomegaly accentuated by AP portable technique. Right hemidiaphragm elevation. Small right-sided pleural effusion is not significantly changed. Loculated pleural fluid or thickening. Similar. Slight improvement in right mid and lower lung airspace disease. Minimal left base atelectasis. IMPRESSION: Slight improvement in right-sided aeration with pleural fluid and airspace disease remaining. No pneumothorax. Electronically Signed   By: Abigail Miyamoto M.D.   On: 05/20/2017 07:42    Dimple Casey, Medical  Student 05/20/2017, 8:56 AM MS4, [place] Intern pager: [phone], text pages welcome  FPTS Upper-Level Resident Addendum  I have independently interviewed and examined the patient. I have discussed the above with the original author and agree with their documentation. Please see also any attending notes.   Bufford Lope, DO PGY-2, [facility] 05/20/2017 3:26 PM  FPTS Service pager: [phone] (text pages welcome through [place])
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
## 2017-05-20 NOTE — Progress Notes (Signed)
PT Cancellation Note  Patient Details Name: [name] MRN: [id number] DOB: 04/19/1965   Cancelled Treatment:    Reason Eval/Treat Not Completed: Medical issues which prohibited therapy RN requests that PT hold for the morning due to issues with very high blood pressures. Will plan to follow up with RN and attempt to return to patient as/if able per patient status and schedule allowance this afternoon.    Deniece Ree PT, DPT, CBIS  Supplemental Physical Therapist [facility]   Pager [phone]

## 2017-05-21 ENCOUNTER — Inpatient Hospital Stay (HOSPITAL_COMMUNITY): Payer: Medicare Other

## 2017-05-21 DIAGNOSIS — I160 Hypertensive urgency: Secondary | ICD-10-CM

## 2017-05-21 LAB — CBC
HCT: 35.3 % — ABNORMAL LOW (ref 39.0–52.0)
Hemoglobin: 11.5 g/dL — ABNORMAL LOW (ref 13.0–17.0)
MCH: 28.7 pg (ref 26.0–34.0)
MCHC: 32.6 g/dL (ref 30.0–36.0)
MCV: 88 fL (ref 78.0–100.0)
PLATELETS: 555 10*3/uL — AB (ref 150–400)
RBC: 4.01 MIL/uL — AB (ref 4.22–5.81)
RDW: 13.8 % (ref 11.5–15.5)
WBC: 12.5 10*3/uL — AB (ref 4.0–10.5)

## 2017-05-21 LAB — TSH: TSH: 0.385 u[IU]/mL (ref 0.350–4.500)

## 2017-05-21 LAB — CULTURE, BLOOD (ROUTINE X 2)
Culture: NO GROWTH
Culture: NO GROWTH
SPECIAL REQUESTS: ADEQUATE
Special Requests: ADEQUATE

## 2017-05-21 LAB — PATHOLOGIST SMEAR REVIEW

## 2017-05-21 LAB — BASIC METABOLIC PANEL
Anion gap: 11 (ref 5–15)
BUN: 38 mg/dL — ABNORMAL HIGH (ref 6–20)
CO2: 21 mmol/L — ABNORMAL LOW (ref 22–32)
Calcium: 9.1 mg/dL (ref 8.9–10.3)
Chloride: 101 mmol/L (ref 101–111)
Creatinine, Ser: 2.26 mg/dL — ABNORMAL HIGH (ref 0.61–1.24)
GFR calc Af Amer: 37 mL/min — ABNORMAL LOW (ref >60)
GFR, EST NON AFRICAN AMERICAN: 32 mL/min — AB (ref >60)
GLUCOSE: 289 mg/dL — AB (ref 65–99)
POTASSIUM: 4.6 mmol/L (ref 3.5–5.1)
Sodium: 133 mmol/L — ABNORMAL LOW (ref 135–145)

## 2017-05-21 LAB — ACID FAST SMEAR (AFB, MYCOBACTERIA)
Acid Fast Smear: NEGATIVE
Acid Fast Smear: NEGATIVE

## 2017-05-21 LAB — TROPONIN I: TROPONIN I: 0.04 ng/mL — AB (ref <0.03)

## 2017-05-21 LAB — GLUCOSE, CAPILLARY
Glucose-Capillary: 281 mg/dL — ABNORMAL HIGH (ref 65–99)
Glucose-Capillary: 214 mg/dL — ABNORMAL HIGH (ref 65–99)
Glucose-Capillary: 205 mg/dL — ABNORMAL HIGH (ref 65–99)
Glucose-Capillary: 137 mg/dL — ABNORMAL HIGH (ref 65–99)

## 2017-05-21 LAB — ACID FAST SMEAR (AFB)

## 2017-05-21 LAB — CORTISOL: Cortisol, Plasma: 16.5 ug/dL

## 2017-05-21 MED ORDER — SPIRONOLACTONE 25 MG PO TABS
50.0000 mg | ORAL_TABLET | Freq: Two times a day (BID) | ORAL | Status: DC
  Administered 2017-05-22: 50 mg via ORAL
  Filled 2017-05-21: qty 2

## 2017-05-21 MED ORDER — LEVALBUTEROL HCL 0.63 MG/3ML IN NEBU: 0.6300 mg | INHALATION_SOLUTION | Freq: Four times a day (QID) | RESPIRATORY_TRACT | Status: DC | PRN

## 2017-05-21 MED ORDER — OXYCODONE HCL 5 MG PO TABS: 5.0000 mg | ORAL_TABLET | Freq: Four times a day (QID) | ORAL | Status: DC | PRN

## 2017-05-21 MED ORDER — SODIUM CHLORIDE 0.9% FLUSH
10.0000 mL | INTRAVENOUS | Status: DC | PRN
  Administered 2017-05-21 – 2017-05-22 (×2): 10 mL
  Filled 2017-05-21 (×2): qty 40

## 2017-05-21 MED ORDER — SPIRONOLACTONE 25 MG PO TABS
25.0000 mg | ORAL_TABLET | Freq: Once | ORAL | Status: AC
  Administered 2017-05-21: 25 mg via ORAL
  Filled 2017-05-21: qty 1
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
## 2017-05-21 NOTE — Progress Notes (Signed)
Renal artery duplex prelim: no evidence of stenosis. Landry Mellow, RDMS, RVT

## 2017-05-21 NOTE — Progress Notes (Signed)
Inpatient Diabetes Program Recommendations  AACE/ADA: New Consensus Statement on Inpatient Glycemic Control (2015)  Target Ranges:  Prepandial:   less than 140 mg/dL      Peak postprandial:   less than 180 mg/dL (1-2 hours)      Critically ill patients:  140 - 180 mg/dL   Lab Results  Component Value Date   GLUCAP 214 (H) 05/21/2017   HGBA1C >15.5 (H) 05/16/2017    Review of Glycemic ControlResults for Bol[place], KLEBER (MRN [id number]) as of 05/21/2017 13:11  Ref. Range 05/20/2017 12:21 05/20/2017 16:18 05/20/2017 21:23 05/21/2017 06:29 05/21/2017 11:31  Glucose-Capillary Latest Ref Range: 65 - 99 mg/dL 308 (H) 259 (H) 156 (H) 281 (H) 214 (H)   Diabetes history: Type 2 DM Outpatient Diabetes medications: Novolog 2-12 units tid, Lantus 16 units daily Current orders for Inpatient glycemic control:  Novolog resistant tid with meals and HS, Lantus 20 units daily  Inpatient Diabetes Program Recommendations:    Attempted to see patient today regarding A1C.  He was very sleepy.  He did state that he was taking insulin prior to admit. Left A1C sheet "know your numbers" at bedside.  Will see patient on 2/13 to discuss DM management further.    Please consider adding Novolog meal coverage 4 units tid with meals.   Thanks,  Adah Perl, RN, BC-ADM Inpatient Diabetes Coordinator Pager [phone] (8a-5p)

## 2017-05-21 NOTE — Progress Notes (Addendum)
      [address]       [place] [address]             [phone]      4 Days Post-Op Procedure(s) (LRB): RIGHT VIDEO ASSISTED THORACOSCOPY (VATS)/EMPYEMA (Right) FLEXIBLE BRONCHOSCOPY EMPYEMA DRAINAGE AND DECORTICATION (Right) RIGHT MINI THORACOTOMY (Right) Subjective: Orange juice on his breakfast tray again this morning. Advised not to drink due to being an uncontrolled type 2 diabetic. Feels much better this morning. Pain is well controlled.   Objective: Vital signs in last 24 hours: Temp:  [97.5 F (36.4 C)-98.6 F (37 C)] 98.5 F (36.9 C) (02/12 0444) Pulse Rate:  [95-109] 95 (02/12 0444) Cardiac Rhythm: Normal sinus rhythm (02/12 0731) Resp:  [18-23] 20 (02/12 0444) BP: (144-181)/(83-118) 153/95 (02/12 0537) SpO2:  [95 %-98 %] 96 % (02/12 0444) Weight:  [242 lb 14.4 oz (110.2 kg)] 242 lb 14.4 oz (110.2 kg) (02/12 0300)     Intake/Output from previous day: 02/11 0701 - 02/12 0700 In: 1940 [P.O.:1590; IV Piggyback:350] Out: 1220 [Urine:1195; Chest Tube:25] Intake/Output this shift: No intake/output data recorded.  General appearance: alert, cooperative and no distress Heart: regular rate and rhythm, S1, S2 normal, no murmur, click, rub or gallop Lungs: clear to auscultation bilaterally Abdomen: soft, non-tender; bowel sounds normal; no masses,  no organomegaly Extremities: extremities normal, atraumatic, no cyanosis or edema Wound: clean and dry  Lab Results: Recent Labs    05/20/17 0516 05/21/17 0531  WBC 13.2* 12.5*  HGB 12.1* 11.5*  HCT 35.9* 35.3*  PLT 549* 555*   BMET:  Recent Labs    05/20/17 0516 05/21/17 0531  NA 133* 133*  K 4.1 4.6  CL 100* 101  CO2 20* 21*  GLUCOSE 274* 289*  BUN 39* 38*  CREATININE 2.30* 2.26*  CALCIUM 8.9 9.1    PT/INR: No results for input(s): LABPROT, INR in the last 72 hours. ABG    Component Value Date/Time   PHART 7.286 (L) 05/17/2017 1817   HCO3 22.4 05/17/2017 1817   TCO2 24  05/17/2017 1817   ACIDBASEDEF 5.0 (H) 05/17/2017 1817   O2SAT 95.0 05/17/2017 1817   CBG (last 3)  Recent Labs    05/20/17 1618 05/20/17 2123 05/21/17 0629  GLUCAP 259* 156* 281*    Assessment/Plan: S/P Procedure(s) (LRB): RIGHT VIDEO ASSISTED THORACOSCOPY (VATS)/EMPYEMA (Right) FLEXIBLE BRONCHOSCOPY EMPYEMA DRAINAGE AND DECORTICATION (Right) RIGHT MINI THORACOTOMY (Right)  1. CV-BP remains uncontrolled, but SBP better. On his home Amlodipine 22m daily, and Clonidine 0.252mTID and Spirolactone. He remains on Hydralazine 10-4024m4hours PRN and Labetalol 20m34m q2Hours. NSR in the 80s. 2. Pulm-tolerating room air.  CXR pending.  3. Renal-CKD, creatinine 2.26. Electrolytes okay. Nephrology following 4. Endo-Blood glucose uncontrolled. Continue SSI and Lantus. Inpatient diabetes coordinator consulted.  5. H and H is stable.  6. Medical care per attending  Plan: Full workup for BP. Possibly remove of remaining chest tube. Await CXR this morning. Ambulate TID. Continue ABX Zithromax and Rocephin.   CXR reviewed with Dr. GerhServando Snareder placed to remove remaining chest tube.    LOS: 5 days    TessElgie Collard2/2019

## 2017-05-21 NOTE — Progress Notes (Signed)
Pt educated regarding order to pull chest tube. Order reviewed and Anderson Malta RN at bedside to assess in CT D/C. Pt given pain med before see mar. Dressing removed, sutures cut and CT d/c per order and sutures tied x 5 knots with psi dressing applied. Pt educated to stay in bed for one hour before getting up V/S monitored x 15 min. Will continue to monitor.

## 2017-05-21 NOTE — Progress Notes (Signed)
Rehab admissions - I am following for potential acute inpatient rehab admission.  Patient was having his chest tube removed when I rounded earlier.  I will follow up in am to see patient.  Inpatient rehab beds are full today.  Call me for questions.  #[phone]

## 2017-05-21 NOTE — Care Management Important Message (Signed)
Important Message  Patient Details  Name: [name] MRN: [id number] Date of Birth: [birth year]   Medicare Important Message Given:  Yes    [name] Montine Circle 05/21/2017, 8:42 AM

## 2017-05-21 NOTE — Progress Notes (Signed)
PT Cancellation Note  Patient Details Name: [name] MRN: [id number] DOB: [birth year]   Cancelled Treatment:    Reason Eval/Treat Not Completed: Patient at procedure or test/unavailable per RN, pt getting chest tube removed this AM and then will need to be on bedrest for ~1 hour. Will follow up if time in PM.   [place] 05/21/2017, 11:17 AM Wray Kearns, PT, DPT [phone]

## 2017-05-21 NOTE — Progress Notes (Signed)
Family Medicine Teaching Service Daily Progress Note Intern Pager: [phone]  Patient name: [name] Medical record number: [id number] Date of birth: [birth year] Age: 52 y.o. Gender: male  Primary Care Provider: Bartholome Bill, MD Consultants: CCM, TCTS Code Status: full  Pt Overview and Major Events to Date:  [name] a 52 y.o.malepresentingpresenting with R-sided complicated parapneumonic effusion s/p VATS 2/8. PMH is significant for T2DM, HTN,CKD IV, OSA, mixed anxiety and depression mood disorder.  2/8: thoracentesis 2/8: VATS with decortication 2/8: remained intubated in ICU 2/9: extubated 2/10: FPTS became primary team 2/11: transition vanc/zosyn to CTX/azithro, remove chest tube  Assessment and Plan: R-Sided Complicated Parapneumonic Effusion: Pt s/p VATS 2/8, extubated on 2/9. Upper chest tube removed 2/11 with no subsequent pneumothorax and CXR today unchanged with small amount of R-sided loculated pleural fluid remaining. Lower chest tube removed this morning. Labs consistent with exudative effusion, likely parapneumonic. Patient w neg cultures and improving leukocytosis.  - TCTS following, appreciate reccs; appreciate recommendations - Narrow to IV CTX + IV azithro (2/11- ) for CAP coverage, s/p IV vanc/zosyn (2/7-2/11); plan to transition to PO tomorrow pending clinical improvement - Micro:    --anaerobic pleural fluid culture, no growth at 5 days   --bronchial lavage culture (anaerobic and aerobic) no growth at 4 days; WBC present on gram stain    --aerobic pleural fluid culture no growth at 3 days; gram stain w/o WBC or organisms   --blood cultures no growth at 4 days   --pleural fluid fungal, AFB pending; fungal stain without evidence of fungus - Daily CBC, BMP - Fentanyl, Tylenol, and tramadol for pain - Bowel regimen: Colace, Senokot and MiraLAX  - Xopenex nebulizer Q6H prn, mucinex - Aspirin 81 mg - PT/OT; recommend CIR - Strict I/O's  Resistant HTN:  Patient has history of resistant HTN; unclear on home meds, but some combination of amlodipine 10mg , clonidine 0.1mg  TID, furosemide 120mg  daily, spironolactone 25mg  BID, labetalol 400mg  BID, hydralazine 50mg  TID. Improvement in systolic BP to 725-366Y systolic with increase of clonidine to 0.2mg  TID yesterday; patient required 4 prn antihypertensive doses. - Consult nephrology, appreciate recommendations - Continue labetalol 20 mg PRN BP >160/> 110 - Hydralazine PRN BP >160/> 110 - Continue amlodipine 10 mg - Continue clonidine 0.2 mg TID - Plan to increase spironolactone this evening or tomorrow from 25 to 50mg  BID one renin/aldosterone collected - Secondary HTN workup:   - AM cortisol, TSH wnl   - Serum serotonin, 5 HIAA   - Metanephrines, catecholamines   - Renin/aldosterone   - Renal Artery Duplex: prelim read w/o stenosis  CKD IV:AKI resoved; Cr on admission 3.32. >> 2.26 on 2/12.  Baseline 2.7-2.8 (12/2014) -Nephrology consult as above - Renally dose when appropriate and avoid nephrotoxic drugs  T2DM, uncontrolledA1c this admission > 15.5; will continue to titrate insulin regimen - rSSI - 20u lantus today (increase from 13u yesterday) - nutrition, diabetic education referral - gabapentin 300mg  BID  FEN/GI: Heart healthy diet PPx: Heparin  Disposition: CIR per PT/OT and PM&R  Subjective:  Patient reports feeling well this morning. He has some tenderness around the chest tube insertion site but does not have chest pain or shortness of breath similar to yesterday.   Objective: Temp:  [97.5 F (36.4 C)-98.6 F (37 C)] 98.5 F (36.9 C) (02/12 0444) Pulse Rate:  [95-109] 95 (02/12 0444) Resp:  [18-23] 20 (02/12 0444) BP: (144-181)/(83-118) 153/95 (02/12 0537) SpO2:  [95 %-98 %] 96 % (02/12 0444) Weight:  [403  lb 14.4 oz (110.2 kg)] 242 lb 14.4 oz (110.2 kg) (02/12 0300) Physical Exam: General: lying in bed, NAD, slept through majority of exam Cardiovascular: RRR, no  m/r/g Respiratory: good respiratory effort, normal WOB on RA. Less chest excursion on R side and diminished breath sounds in RML and RLL. Left lung CTA. R sided chest tube in place with minimal serosanginous drainage appreciated in tube. No air leak.  Abdomen: deferred Extremities: WWP, no edema  Laboratory: Recent Labs  Lab 05/19/17 0444 05/20/17 0516 05/21/17 0531  WBC 14.2* 13.2* 12.5*  HGB 11.4* 12.1* 11.5*  HCT 34.4* 35.9* 35.3*  PLT 507* 549* 555*   Recent Labs  Lab 05/16/17 1119  05/18/17 0235 05/19/17 0444 05/20/17 0516  NA 121*   < > 131* 134* 133*  K 5.2*   < > 4.6 4.3 4.1  CL 86*   < > 99* 102 100*  CO2 19*   < > 18* 19* 20*  BUN 48*   < > 51* 45* 39*  CREATININE 3.32*   < > 3.00* 2.65* 2.30*  CALCIUM 9.5   < > 8.9 8.7* 8.9  PROT 8.4*  --   --  7.3  --   BILITOT 0.7  --   --  0.6  --   ALKPHOS 153*  --   --  172*  --   ALT 17  --   --  17  --   AST 17  --   --  19  --   GLUCOSE 584*   < > 293* 272* 274*   < > = values in this interval not displayed.    Imaging/Diagnostic Tests: none  Dimple Casey, Medical Student 05/21/2017, 7:18 AM MS4, [place] Intern pager: [phone], text pages welcome  FPTS Upper-Level Resident Addendum  I have independently interviewed and examined the patient. I have discussed the above with the original author and agree with their documentation. My edits for correction/addition/clarification are in blue. Please see also any attending notes.   Hyman Bible, MD PGY-3, [facility] Service pager: [phone] (text pages welcome through [facility])

## 2017-05-21 NOTE — Progress Notes (Signed)
[facility] Progress Note    Assessment/ Plan:   1. 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 He also had a 2013 study notable for small adrenal nodule. Hyperaldosteronism may be caused by a hypersecreting adrenal adenoma vs. adrenal hyperplasia.   - Less likely thyroid etiology with normal TSH, also cortisol and Urine tox previously WNL/denies drug use - continue amlodipine, PRNs hydral and labetolol  - increased clonidine to 0.2 mg TID - consider increasing spironolactone after renin and aldosterone levels collected  Remainder of hypertensive workup pending:   - Renin and aldosterone levels - plasma metanephrines and catecholamines  - renal artery duplex  2. CKD IV, stable; BL Cr 2.7-2.8 (12/2014) with AKI this admission, now resolved. Renal function is stable. Continue to monitor daily. Patient follows with nephrology at [facility].  3. T2DM - A1c >15, insulin dependent. Management per primary team.  4. Electrolytes - Na 133, K 4.6. Stable, continue to monitor.  Subjective:   Patient pleasant and interactive this AM. He denies headaches or blurry vision with elevated BP. States he felt he had some edema in his LE yesterday but feels this is not there today.   Objective:   BP (!) 173/114 (BP Location: Right Arm) Comment: PRN Labatlol given iv  Pulse (!) 118   Temp 98.5 F (36.9 C) (Oral)   Resp (!) 23   Ht 5\' 7"  (1.702 m)   Wt 110.2 kg (242 lb 14.4 oz)   SpO2 98%   BMI 38.04 kg/m   Intake/Output Summary (Last 24 hours) at 05/21/2017 4098 Last data filed at 05/21/2017 0805 Gross per 24 hour  Intake 1700 ml  Output 1195 ml  Net 505 ml   Weight change: -11.2 oz (-0.318 kg)  Physical Exam: Gen:NAD, rests in bed CVS:RRR, no m/r/g Resp:coarse breath sounds anteriorly JXB:JYNW, nt, nd Ext: 0-1+ LE edema  bil  Imaging: Dg Chest Port 1 View  Result Date: 05/21/2017 CLINICAL DATA:  Diminished right breath sounds. Right chest tube in place. EXAM: PORTABLE CHEST 1 VIEW COMPARISON:  05/20/2017, 05/19/2017, and 05/18/2017 FINDINGS: Right-sided chest tube is unchanged. No pneumothorax. Central venous catheter is unchanged appears in good position. No significant change in residual loculated pleural fluid on the right. Left lung is clear.  Heart size and vascularity are normal. IMPRESSION: No significant change in the appearance of the chest since the prior study. Small amount of loculated pleural fluid remains on the right. Electronically Signed   By: Lorriane Shire M.D.   On: 05/21/2017 08:50   Dg Chest Port 1 View  Result Date: 05/20/2017 CLINICAL DATA:  Right-sided chest tube in place. Diabetes. Hypertension. EXAM: PORTABLE CHEST 1 VIEW COMPARISON:  05/19/2017 FINDINGS: Numerous leads and wires project over the chest. Right internal jugular line tip at mid to high SVC. There is 1 and a probable second right-sided chest tube, similar in position. Midline trachea. Cardiomegaly accentuated by AP portable technique. Right hemidiaphragm elevation. Small right-sided pleural effusion is not significantly changed. Loculated pleural fluid or thickening. Similar. Slight improvement in right mid and lower lung airspace disease. Minimal left base atelectasis. IMPRESSION: Slight improvement in right-sided aeration with pleural fluid and airspace disease remaining. No pneumothorax. Electronically Signed   By: Abigail Miyamoto M.D.   On: 05/20/2017 07:42    Labs: BMET Recent Labs  Lab 05/16/17 1119 05/16/17 1550  05/18/17 0235 05/19/17 0444 05/20/17 0516 05/21/17 0531  NA 121* 124* 131* 134* 133* 133*  K 5.2* 4.7 4.6 4.3 4.1 4.6  CL 86* 90* 99* 102 100* 101  CO2 19* 20* 18* 19* 20* 21*  GLUCOSE 584* 507* 293* 272* 274* 289*  BUN 48* 48* 51* 45* 39* 38*  CREATININE 3.32* 3.19* 3.00* 2.65* 2.30* 2.26*  CALCIUM 9.5  9.2 8.9 8.7* 8.9 9.1  PHOS  --   --  5.2* 4.4  --   --    CBC Recent Labs  Lab 05/16/17 1119  05/18/17 0235 05/19/17 0444 05/20/17 0516 05/21/17 0531  WBC 15.5*   < > 15.6* 14.2* 13.2* 12.5*  NEUTROABS 12.8*  --   --   --   --   --   HGB 12.4*   < > 11.0* 11.4* 12.1* 11.5*  HCT 36.0*   < > 33.2* 34.4* 35.9* 35.3*  MCV 85.1   < > 87.1 87.8 87.1 88.0  PLT 420*   < > 453* 507* 549* 555*   < > = values in this interval not displayed.    Medications:    . acetaminophen  1,000 mg Oral Q6H   Or  . acetaminophen (TYLENOL) oral liquid 160 mg/5 mL  1,000 mg Oral Q6H  . amLODipine  10 mg Oral Daily  . aspirin  81 mg Oral Daily  . bisacodyl  10 mg Oral Daily  . cloNIDine  0.2 mg Oral TID  . gabapentin  300 mg Oral BID  . guaiFENesin  600 mg Oral BID  . heparin injection (subcutaneous)  5,000 Units Subcutaneous Q8H  . insulin aspart  0-20 Units Subcutaneous TID WC  . insulin aspart  0-5 Units Subcutaneous QHS  . insulin glargine  20 Units Subcutaneous Daily  . levalbuterol  0.63 mg Nebulization TID  . senna-docusate  1 tablet Oral QHS  . spironolactone  25 mg Oral BID    Everrett Coombe, MD PGY2 05/21/2017, 9:38 AM   I have seen and examined this patient and agree with plan and assessment in the above note with renal recommendations/intervention highlighted.  BP was 130/80 with HR of 85 when examined.  Will likely require catapress patch of 0.2 given the fluctuations of HR and BP.   Governor Rooks Lakin Romer,MD 05/21/2017 12:39 PM

## 2017-05-22 ENCOUNTER — Other Ambulatory Visit: Payer: Self-pay | Admitting: Family Medicine

## 2017-05-22 ENCOUNTER — Inpatient Hospital Stay (HOSPITAL_COMMUNITY): Payer: Medicare Other

## 2017-05-22 LAB — CBC
HCT: 33 % — ABNORMAL LOW (ref 39.0–52.0)
HEMOGLOBIN: 10.5 g/dL — AB (ref 13.0–17.0)
MCH: 28.3 pg (ref 26.0–34.0)
MCHC: 31.8 g/dL (ref 30.0–36.0)
MCV: 88.9 fL (ref 78.0–100.0)
Platelets: 545 10*3/uL — ABNORMAL HIGH (ref 150–400)
RBC: 3.71 MIL/uL — ABNORMAL LOW (ref 4.22–5.81)
RDW: 13.7 % (ref 11.5–15.5)
WBC: 13.2 10*3/uL — ABNORMAL HIGH (ref 4.0–10.5)

## 2017-05-22 LAB — ANAEROBIC CULTURE

## 2017-05-22 LAB — CHOLESTEROL, BODY FLUID: CHOL FL: 85 mg/dL

## 2017-05-22 LAB — METANEPHRINES, PLASMA
Metanephrine, Free: 24 pg/mL (ref 0–62)
Normetanephrine, Free: 185 pg/mL — ABNORMAL HIGH (ref 0–145)

## 2017-05-22 LAB — GLUCOSE, CAPILLARY
Glucose-Capillary: 231 mg/dL — ABNORMAL HIGH (ref 65–99)
Glucose-Capillary: 184 mg/dL — ABNORMAL HIGH (ref 65–99)
Glucose-Capillary: 197 mg/dL — ABNORMAL HIGH (ref 65–99)
Glucose-Capillary: 194 mg/dL — ABNORMAL HIGH (ref 65–99)

## 2017-05-22 LAB — BASIC METABOLIC PANEL
Anion gap: 11 (ref 5–15)
BUN: 36 mg/dL — AB (ref 6–20)
CALCIUM: 9 mg/dL (ref 8.9–10.3)
CO2: 21 mmol/L — AB (ref 22–32)
CREATININE: 2.16 mg/dL — AB (ref 0.61–1.24)
Chloride: 101 mmol/L (ref 101–111)
GFR calc non Af Amer: 34 mL/min — ABNORMAL LOW (ref >60)
GFR, EST AFRICAN AMERICAN: 39 mL/min — AB (ref >60)
GLUCOSE: 223 mg/dL — AB (ref 65–99)
Potassium: 4.5 mmol/L (ref 3.5–5.1)
Sodium: 133 mmol/L — ABNORMAL LOW (ref 135–145)

## 2017-05-22 LAB — MISC LABCORP TEST (SEND OUT): Labcorp test code: 74476

## 2017-05-22 LAB — CATECHOLAMINES, FRACTIONATED, PLASMA
Dopamine: <30 pg/mL (ref 0–48)
NOREPINEPHRINE: 567 pg/mL (ref 0–874)

## 2017-05-22 LAB — TROPONIN I: Troponin I: 0.03 ng/mL (ref <0.03)

## 2017-05-22 MED ORDER — INSULIN GLARGINE 100 UNIT/ML ~~LOC~~ SOLN: 26.0000 [IU] | Freq: Every day | SUBCUTANEOUS | Status: DC

## 2017-05-22 MED ORDER — SPIRONOLACTONE 50 MG PO TABS: 50.0000 mg | ORAL_TABLET | Freq: Two times a day (BID) | ORAL | 0 refills | Status: DC

## 2017-05-22 MED ORDER — CEFDINIR 300 MG PO CAPS: 300.0000 mg | ORAL_CAPSULE | Freq: Two times a day (BID) | ORAL | 0 refills | Status: AC

## 2017-05-22 MED ORDER — INSULIN GLARGINE 100 UNIT/ML ~~LOC~~ SOLN: 20.0000 [IU] | Freq: Every day | SUBCUTANEOUS | 0 refills | Status: DC

## 2017-05-22 MED ORDER — AZITHROMYCIN 250 MG PO TABS: 250.0000 mg | ORAL_TABLET | Freq: Every day | ORAL | 0 refills | Status: AC

## 2017-05-22 MED ORDER — LIVING WELL WITH DIABETES BOOK
Freq: Once | Status: DC
  Filled 2017-05-22: qty 1

## 2017-05-22 MED ORDER — CLONIDINE 0.2 MG/24HR TD PTWK: 0.2000 mg | MEDICATED_PATCH | TRANSDERMAL | 0 refills | Status: DC

## 2017-05-22 MED ORDER — LABETALOL HCL 200 MG PO TABS
200.0000 mg | ORAL_TABLET | Freq: Two times a day (BID) | ORAL | Status: DC
  Administered 2017-05-22: 200 mg via ORAL
  Filled 2017-05-22: qty 1

## 2017-05-22 NOTE — Progress Notes (Addendum)
      [address]       [place] [address]             [phone]      5 Days Post-Op Procedure(s) (LRB): RIGHT VIDEO ASSISTED THORACOSCOPY (VATS)/EMPYEMA (Right) FLEXIBLE BRONCHOSCOPY EMPYEMA DRAINAGE AND DECORTICATION (Right) RIGHT MINI THORACOTOMY (Right)   Subjective:  Patient complains of abdominal pain this morning.  He denies N/V.  He has moved his bowels without issues.  Denies pain at surgical incision.  Objective: Vital signs in last 24 hours: Temp:  [97.9 F (36.6 C)-98.5 F (36.9 C)] 98.5 F (36.9 C) (02/13 0405) Pulse Rate:  [91] 91 (02/12 2144) Cardiac Rhythm: Normal sinus rhythm (02/13 0737) Resp:  [15-23] 23 (02/13 0405) BP: (142-173)/(93-118) 152/111 (02/13 0405) SpO2:  [95 %-96 %] 96 % (02/12 2144) Weight:  [242 lb 14.4 oz (110.2 kg)] 242 lb 14.4 oz (110.2 kg) (02/13 0354)  Intake/Output from previous day: 02/12 0701 - 02/13 0700 In: 610 [P.O.:240; I.V.:20; IV Piggyback:350] Out: 251 [Urine:250; Stool:1]  General appearance: alert, cooperative and no distress Heart: regular rate and rhythm Lungs: diminished breath sounds bibasilar Abdomen: soft, non-tender; bowel sounds normal; no masses,  no organomegaly Extremities: extremities normal, atraumatic, no cyanosis or edema Wound: clean and dry  Lab Results: Recent Labs    05/21/17 0531 05/22/17 0441  WBC 12.5* 13.2*  HGB 11.5* 10.5*  HCT 35.3* 33.0*  PLT 555* 545*   BMET:  Recent Labs    05/21/17 0531 05/22/17 0441  NA 133* 133*  K 4.6 4.5  CL 101 101  CO2 21* 21*  GLUCOSE 289* 223*  BUN 38* 36*  CREATININE 2.26* 2.16*  CALCIUM 9.1 9.0    PT/INR: No results for input(s): LABPROT, INR in the last 72 hours. ABG    Component Value Date/Time   PHART 7.286 (L) 05/17/2017 1817   HCO3 22.4 05/17/2017 1817   TCO2 24 05/17/2017 1817   ACIDBASEDEF 5.0 (H) 05/17/2017 1817   O2SAT 95.0 05/17/2017 1817   CBG (last 3)  Recent Labs    05/21/17 1651 05/21/17 2137  05/22/17 0618  GLUCAP 205* 137* 231*    Assessment/Plan: S/P Procedure(s) (LRB): RIGHT VIDEO ASSISTED THORACOSCOPY (VATS)/EMPYEMA (Right) FLEXIBLE BRONCHOSCOPY EMPYEMA DRAINAGE AND DECORTICATION (Right) RIGHT MINI THORACOTOMY (Right)  1. Empyema- all chest tubes out, final one removed yesterday- will get 2V CXR this morning 2. ID- afebrile, OR cultures are negative, on Rocephin and Azithromycin 3. CV- NSR, remains hypertensive- on Norvasc, Catapres, Hydralazine prn 4. Renal- Stage IV CKD, creatinine at 2.16, Nephrology following 5. DM- uncontrolled, on Lantus at 20U daily 6. Dispo- care per medicine, 2V CXR today if stable will sign off, follow up appointment has been placed in chart    LOS: 6 days    Ellwood Handler 05/22/2017  stablr day,chest xray stable Home soon I have seen and examined [name] and agree with the above assessment  and plan.  Grace Isaac MD [place] [phone] Office [phone] 05/22/2017 5:57 PM

## 2017-05-22 NOTE — Progress Notes (Signed)
Family Medicine Teaching Service Daily Progress Note Intern Pager: [phone]  Patient name: [name] Medical record number: [id number] Date of birth: 26-Sep-1965 Age: 52 y.o. Gender: male  Primary Care Provider: Bartholome Bill, MD Consultants: CCM, TCTS Code Status: full  Pt Overview and Major Events to Date:  Adolf Cordis a 53 y.o.malepresenting with R-sided complicated parapneumonic effusions/pVATS2/8. PMH is significant for T2DM, HTN,CKD IV, OSA, mixed anxiety and depression mooddisorder. 2/8: thoracentesis 2/8:VATS with decortication 2/8:remained intubated in ICU 2/9:extubated 2/10:FPTS became primary team 2/11: transition vanc/zosyn to CTX/azithro, remove upper chest tube 2/12: remove lower chest tube 2/13: discharge  Assessment and Plan: R-Sided Complicated Parapneumonic Effusion, improving: Pt s/pVATS 2/8,extubated on 2/9.Upper chest tube removed 2/11 and lower removed 2/12 with no subsequent pneumothorax. CXR today no pneumothorax, small amount of residual loculated fluid on R. Negative blood and fluid cultures; mild worsening of leukocytosis from 12.5 yesterday to 13.2 today.  -Per TCTS, if CXR stable today then will sign off - IV CTX + IV azithro (2/11- ) while admitted for CAP coverage, s/p IV vanc/zosyn (2/7-2/11). Will plan to transition to PO cefidinir and azithromycin on DC cefdinir/azithromycin for total 14 day abx -Micro:   --bronchial lavage culture (anaerobic and aerobic) no growth x4 days; WBC present on gram stain --pleural fluid culture (aerobic and anaerobic) no growth x3 days; gram stain w/o WBC or organisms --blood cultures no growth x5 days --pleural fluid fungal, AFB pending; fungal stain without evidence of fungus   --AFB smear neg   --smear reviewed by pathology and w/o malignant cells - Daily CBC, BMP - Tylenol, and tramadol for pain - Bowel regimen: Colace, Senokot and MiraLAX  - Xopenex nebulizerQ6H prn,  mucinex - Aspirin 81 mg - PT/OT; recommend home health PT  Resistant HTN: likely secondary due to hyperaldosteronism. At home on amlodipine 10mg , clonidine 0.1/0.1/0.2mg  TID, furosemide 120mg  daily, spironolactone 25mg  BID, labetalol 400mg BID, hydralazine 50mg  TID. Improvement in systolic BP to 315-400Q - Nephrology following: ok to restart outpatient lasix dose  - Start labetalol 200mg  BID - Continue amlodipine 10 mg, clonidine0.2 mg TID, spironolactone 50mg  BID - Secondary HTN workup:   - AM cortisol wnl, TSH wnl, catecholamines neg   - Serum serotonin, 5 HIAA, Metanephrines, Renin/aldosterone pending    - Renal Artery Duplex w/o stenosis  CKD IV:AKI resoved;Cr on admission 3.32. >> 2.16on 2/12.Baseline 2.7-2.8 (12/2014) -monitor renal function - Renally dose when appropriate and avoid nephrotoxic drugs  T2DM, uncontrolledA1c this admission > 15.5; will continue to titrate insulin regimen. Requiring 21 addt'l units SSI in last 24 hrs.  - rSSI - increase lantus from 20 to 26u daily - nutrition, diabetic education referral - gabapentin 300mg  BID  FEN/GI: Heart healthy diet PPx: Heparin  Disposition: home with HHPT  Subjective:  Patient reports feeling significantly better this morning with removal of the chest tube yesterday. He has been up and walking to the bathroom. He reports regular daily bowel movements.   Objective: Temp:  [97.9 F (36.6 C)-99.5 F (37.5 C)] 99.5 F (37.5 C) (02/13 1047) Pulse Rate:  [91] 91 (02/12 2144) Resp:  [16-23] 23 (02/13 0405) BP: (142-162)/(93-111) 151/103 (02/13 1047) SpO2:  [95 %-96 %] 96 % (02/12 2144) Weight:  [242 lb 14.4 oz (110.2 kg)] 242 lb 14.4 oz (110.2 kg) (02/13 0354) Physical Exam: General: alert, oriented, NAD, sitting comfortably in hospital bed Cardiovascular: RRR, no m/r/g. R central line in place in neck.  Respiratory: normal WOB on RA, L lung CTA. R lung with diminished  breath sounds in lower lobe Abdomen:  nontender, normoactive bowel sounds Extremities: WWP, no edema in LE  Laboratory: Recent Labs  Lab 05/20/17 0516 05/21/17 0531 05/22/17 0441  WBC 13.2* 12.5* 13.2*  HGB 12.1* 11.5* 10.5*  HCT 35.9* 35.3* 33.0*  PLT 549* 555* 545*   Recent Labs  Lab 05/16/17 1119  05/19/17 0444 05/20/17 0516 05/21/17 0531 05/22/17 0441  NA 121*   < > 134* 133* 133* 133*  K 5.2*   < > 4.3 4.1 4.6 4.5  CL 86*   < > 102 100* 101 101  CO2 19*   < > 19* 20* 21* 21*  BUN 48*   < > 45* 39* 38* 36*  CREATININE 3.32*   < > 2.65* 2.30* 2.26* 2.16*  CALCIUM 9.5   < > 8.7* 8.9 9.1 9.0  PROT 8.4*  --  7.3  --   --   --   BILITOT 0.7  --  0.6  --   --   --   ALKPHOS 153*  --  172*  --   --   --   ALT 17  --  17  --   --   --   AST 17  --  19  --   --   --   GLUCOSE 584*   < > 272* 274* 289* 223*   < > = values in this interval not displayed.    Imaging/Diagnostic Tests: none  Dimple Casey, Medical Student 05/22/2017, 12:06 PM MS4, [place] [facility] Intern pager: [phone], text pages welcome  FPTS Upper-Level Resident Addendum  I have independently interviewed and examined the patient. I have discussed the above with the original author and agree with their documentation. Please see also any attending notes.   Bufford Lope, DO PGY-2, [facility] 05/22/2017 1:17 PM  FPTS Service pager: [phone] (text pages welcome through [facility])

## 2017-05-22 NOTE — Progress Notes (Signed)
Rehab admissions - Noted that PT now recommending HH therapies.  Patient is now at supervision level and ambulated 225 feet.  He will not need an inpatient rehab stay.  Call me for questions.  #[phone]

## 2017-05-22 NOTE — Progress Notes (Addendum)
Inpatient Diabetes Program Recommendations  AACE/ADA: New Consensus Statement on Inpatient Glycemic Control (2015)  Target Ranges:  Prepandial:   less than 140 mg/dL      Peak postprandial:   less than 180 mg/dL (1-2 hours)      Critically ill patients:  140 - 180 mg/dL   Lab Results  Component Value Date   GLUCAP 231 (H) 05/22/2017   HGBA1C >15.5 (H) 05/16/2017    Review of Glycemic ControlResults for Bender, SONNIE (MRN [id number]) as of 05/22/2017 10:36  Ref. Range 05/21/2017 06:29 05/21/2017 11:31 05/21/2017 16:51 05/21/2017 21:37 05/22/2017 06:18  Glucose-Capillary Latest Ref Range: 65 - 99 mg/dL 281 (H) 214 (H) 205 (H) 137 (H) 231 (H)   Diabetes history: Type 2 DM Outpatient Diabetes medications: Novolog 2-12 units tid, Lantus 16 units daily Current orders for Inpatient glycemic control:  Novolog resistant tid with meals and HS, Lantus 20 units daily Inpatient Diabetes Program Recommendations:    Please consider increasing Lantus to 25 units daily.   Thanks,  Adah Perl, RN, BC-ADM Inpatient Diabetes Coordinator Pager [phone] (8a-5p)   Addendum:  Spoke with patient regarding DM management.  He is still very sleepy.  He states that he was taking his insulin but also noted that he takes it in the same place.  We discussed the importance of rotating sites with insulin administration.  Placed order for "Outpatient DM Education" per protocol/co-sign required so that patient can receive further education. Will also order Living well with DM booklet.  Unclear if patient understands the complications of DM.  We discussed complications of DM however patient continued to close his eyes.

## 2017-05-22 NOTE — Discharge Instructions (Signed)
During your admission, you were treated for an effusion (fluid collection) in your lungs caused by pneumonia. You were given antibiotics and underwent a procedure to remove the fluid collection called a video-assisted thorascopic surgery (VATS). You have a follow-up appointment with the cardiothoracic (heart and lung) surgery team scheduled for 06/13/2017 at 9:30. Please go early (9:00) to their [place] office building to get a chest xray beforehand. Either your primary care doctor or the surgeon will give you further instructions regarding your stitches.  Please continue to take your antibiotics for 7 more days, starting 2/14 and ending 2/20. You might need extended antibiotics so please see your primary care doctor within the next few days to make sure your symptoms are improving.  For your blood pressure, please take all your medication. We changed your clonidine to a weekly patch that will hopefully help. Some of your blood pressure medicines can make your blood pressure higher if you stop them suddenly, so please talk to your primary care doctor before making any changes. You should follow up with nephrology as an outpatient.   A physical therapy team will come to your home to help with mobility and aid you in getting your strength back. They should call you within the next few days to set up appointments.

## 2017-05-22 NOTE — Care Management Note (Signed)
Case Management Note Marvetta Gibbons RN, BSN Unit 4E-Case Manager [phone]  Patient Details  Name: [name] MRN: [id number] Date of Birth: 05/07/1965  Subjective/Objective:   Pt admitted with empyema s/p VATS                 Action/Plan: PTA pt lived at home- independent- per PT eval recommendation for CIR - CIR consulted however pt has improved past needing CIR and now recommendation for [facility] - orders have been placed for DME and HH needs- spoke with pt at bedside- choice offered for [facility] agency- per pt he has selected [facility] for services- referral called to Butch Penny with [facility] - RW to be delivered to room prior to discharge- pt reports that he has PCP, uses [facility] for pharmacy needs and has transportation home.    Expected Discharge Date:    05/22/17              Expected Discharge Plan:  [place]  In-House Referral:  NA  Discharge planning Services  CM Consult  Post Acute Care Choice:  Durable Medical Equipment, Home Health Choice offered to:  Patient  DME Arranged:  [facility] rolling DME Agency:  [place]:  PT [facility] Agency:  [place]  Status of Service:  Completed, signed off  If discussed at [place] of Stay Meetings, dates discussed:   Discharge Disposition: home/home health    Additional Comments:  Dawayne Patricia, RN 05/22/2017, 3:08 PM

## 2017-05-22 NOTE — Discharge Summary (Signed)
[facility] Discharge Summary  Patient name: [name] Medical record number: [id number] Date of birth: 01-18-1966 Age: 52 y.o. Gender: male Date of Admission: 05/16/2017  Date of Discharge: 05/22/2017 Admitting Physician: Grace Isaac, MD  Primary Care Provider: Bartholome Bill, MD Consultants: CCM, TCTS  Indication for Hospitalization: Complicated parapneumonic effusion requiring VATS, mini thoracotomy  Discharge Diagnoses/Problem List:  Complicated pneumonia with R sided parapneumonic effusion Type 2 DM, uncontrolled Resistant HTN CKD IV  Disposition: home with home health/PT  Discharge Condition: Good  Discharge Exam:  General: alert, oriented, NAD, sitting comfortably in hospital bed Cardiovascular: RRR, no m/r/g.  Respiratory: normal WOB on RA, L lung CTA. R lung with diminished breath sounds in lower lobe, improved from prior. No appreciable wheezes or crackles.  Abdomen: nontender, normoactive bowel sounds Extremities: WWP, no edema in LE  Brief Hospital Course:  [name] is a 52 yo M with medical history notable for HTN, T2DM, and CKD IV who presented to the ED on 2/7 with chest pain and SOB ultimately found to have a complicated parapnuemonic effusion. Given multiple medical comorbidities, hospital course by problem detailed below:  Complicated R-sided PNA with Parapneumonic Effusion: Pt presented with severe R-sided chest pain after recent diagnosis of pneumonia several weeks following a R single rib fracture. Imaging on admission notable for large R sided loculated pleural effusion with collapse of RLL and opacities within LL concerning for infection. Patient was started on broad spectrum antibiotics and thoracentesis was attempted on 2/8 with minimal output. Following thoracentesis, patient became more tachypneic and hypoxic and TCTS was consulted, ultimately deciding to proceed with emergent VATS on 2/8 for drainage of suspected  empyema. Extubation was delayed until POD #1 to ensure patient's stability, but he did well otherwise post-operatively and chest tubes x 2 were removed on POD #3 and 4 respectively. Pleural fluid studies were consistent with an exudative effusion and cultures had no growth, suggesting a sterile parapneumonic effusion. Patient improved clinically thereafter with stable vital signs and improving CXR; antibiotics were narrowed from vanc/zosyn to CTX/azithro for coverage of CAP organisms on 2/11 and continued on discharge for at least a total of 14 day course.  Resistant HTN: Throughout hospitalization, [name] had blood pressures that were difficult to control. He reported intermittent adherence to outpatient medication regimen that included >4 antihypertensive agents including self managed change of clonidine 0.1/0.1/0.2 mg TID from 0.2mg  TID due to sleepiness. Oral medications were gradually added in a stepwise fashion and titrated upwards to eventually include: labetalol 200mg  BID, spironolactone 50mg  BID, clonidine tablet 0.2mg  TID, and amlodipine 10mg  daily. Given patient's CKD and blood pressures, nephrology was consulted during admission and initiated a workup for secondary causes given his history of possible hyperaldosteronism in 2012. At the time of discharge, workup notable for low renin, mildly elevated renin:aldosterone ration (56.3), nl aldosterone (although on spironolactone), and mildly elevated normetanephrine but nl catecholamines, TSH, AM cortisol, serotonin, and renal artery duplex. 5 HIAA was pending. Nephrology recommended outpatient resumption of home furosemide dosing as well. His home clonidine was transitioned to patch given his difficulties with medication compliance. He should follow up with his nephrologist at [facility]   Type 2 Diabetes: Patient reported taking his prescribed insulin on admission, however, BG was in the 400s on admission and A1C >15.5. He started on moderate SSI  and lantus which was gradually increased to resistant SSI and lantus 20u daily at the time of discharge.   Issues for Follow  Up:  1. R Sided Complicated PNA with Parapneumoic Effusion: Patient was started on oral cefdinir and azithromycin at discharge for coverage of CAP organisms, which were felt to be the most likely source of his pneumonia and subsequent parapneumonic effusion. He should continue this for a total of 2-4wks total antibiotic coverage. At the time of discharge, he was on day 7 and was given an additional 7 days of antibiotic coverage for a total of 2 weeks. Please assess patient's clinical symptoms at follow-up and consider extending antibiotic course if not significantly improved.  2. Patient may still have stitches from chest tube/VATS in place. These can be removed at follow-up if healing appropriately.  3. HTN: Patient was started on multiple antihypertensive agents which are: amlodipine 10mg  qd, clonidine 0.2 patch weekly, spironolactone 50mg  BID, lasix 120mg  qd. Please consider reviewing these medications with patient to assess adherence and ask patient to keep a home BP log.  4. CKD IV: Patient is followed by Nephrology at [facility]. Please have patient follow-up with them within next few months to discuss secondary HTN workup (5 HIAA pending at time of discharge).  5. Diabetes: patient endorsed adherence to diabetic regimen at home, initially reporting using 15u lantus daily and then 7u when asked again. He was discharged home on 20u daily in addition to mealtime insulin. Please titrate to achieve better BG control.   Significant Procedures:  2/8: Bronchoscopy, right video-assisted thoracoscopy, mini thoracotomy, evacuation of empyema, and decortication.  Significant Labs and Imaging:  Recent Labs  Lab 05/20/17 0516 05/21/17 0531 05/22/17 0441  WBC 13.2* 12.5* 13.2*  HGB 12.1* 11.5* 10.5*  HCT 35.9* 35.3* 33.0*  PLT 549* 555* 545*   Recent Labs  Lab 05/18/17 0235  05/19/17 0444 05/20/17 0516 05/21/17 0531 05/22/17 0441  NA 131* 134* 133* 133* 133*  K 4.6 4.3 4.1 4.6 4.5  CL 99* 102 100* 101 101  CO2 18* 19* 20* 21* 21*  GLUCOSE 293* 272* 274* 289* 223*  BUN 51* 45* 39* 38* 36*  CREATININE 3.00* 2.65* 2.30* 2.26* 2.16*  CALCIUM 8.9 8.7* 8.9 9.1 9.0  MG 2.7* 2.3  --   --   --   PHOS 5.2* 4.4  --   --   --   ALKPHOS  --  172*  --   --   --   AST  --  19  --   --   --   ALT  --  17  --   --   --   ALBUMIN  --  2.0*  --   --   --     Results/Tests Pending at Time of Discharge: Fungal cultures of pleural fluid still pending. 5 HIAA pending.   Discharge Medications:  Allergies as of 05/22/2017   No Known Allergies     Medication List    STOP taking these medications   amoxicillin 500 MG capsule Commonly known as:  AMOXIL   cloNIDine 0.1 MG tablet Commonly known as:  CATAPRES     TAKE these medications   acetaminophen 325 MG tablet Commonly known as:  TYLENOL Take 2 tablets (650 mg total) by mouth every 6 (six) hours as needed for mild pain, moderate pain or headache. What changed:  how much to take   albuterol 108 (90 Base) MCG/ACT inhaler Commonly known as:  PROVENTIL HFA;VENTOLIN HFA Inhale 1-2 puffs into the lungs every 6 (six) hours as needed for wheezing or shortness of breath.   amLODipine 10 MG  tablet Commonly known as:  NORVASC Take 1 tablet (10 mg total) by mouth daily.   aspirin EC 81 MG tablet Take 81 mg by mouth daily.   azithromycin 250 MG tablet Commonly known as:  ZITHROMAX Z-PAK Take 1 tablet (250 mg total) by mouth daily for 7 days. What changed:  additional instructions   cefdinir 300 MG capsule Commonly known as:  OMNICEF Take 1 capsule (300 mg total) by mouth 2 (two) times daily for 7 days.   cloNIDine 0.2 mg/24hr patch Commonly known as:  CATAPRES - Dosed in mg/24 hr Place 1 patch (0.2 mg total) onto the skin once a week.   furosemide 80 MG tablet Commonly known as:  LASIX Take 120 mg by mouth  daily. Notes to patient:  You did not receive this medication during your hospital stay. You may resume it after discharge.   gabapentin 300 MG capsule Commonly known as:  NEURONTIN Take 300 mg by mouth 2 (two) times daily.   HYDROcodone-acetaminophen 5-325 MG tablet Commonly known as:  NORCO/VICODIN Take 1 tablet by mouth daily as needed.   insulin aspart 100 UNIT/ML injection Commonly known as:  NOVOLOG Before each meal 3 times a day, 140-199 - 2 units, 200-250 - 6 units, 251-299 - 8 units,  300-349 - 10 units,  350 or above 12 units. Dispense syringes and needles as needed, Ok to switch to PEN if approved. What changed:    how much to take  how to take this  when to take this  additional instructions   insulin glargine 100 UNIT/ML injection Commonly known as:  LANTUS Inject 0.2 mLs (20 Units total) into the skin daily. What changed:  how much to take   labetalol 200 MG tablet Commonly known as:  NORMODYNE Take 200 mg by mouth 2 (two) times daily.   oxyCODONE-acetaminophen 5-325 MG tablet Commonly known as:  PERCOCET/ROXICET Take 1 tablet by mouth every 8 (eight) hours as needed for severe pain.   potassium chloride SA 20 MEQ tablet Commonly known as:  K-DUR,KLOR-CON Take 20 mEq by mouth 2 (two) times daily. Notes to patient:  You did not receive this medication during your hospital stay. You may resume it after discharge.   spironolactone 50 MG tablet Commonly known as:  ALDACTONE Take 1 tablet (50 mg total) by mouth 2 (two) times daily. What changed:    medication strength  how much to take   tiZANidine 4 MG tablet Commonly known as:  ZANAFLEX Take 4 mg by mouth every 8 (eight) hours as needed.       Discharge Instructions: Please refer to Patient Instructions section of EMR for full details.  Patient was counseled important signs and symptoms that should prompt return to medical care, changes in medications, dietary instructions, activity restrictions,  and follow up appointments.   Follow-Up Appointments: Follow-up Information    Grace Isaac, MD Follow up on 06/13/2017.   Specialty:  Cardiothoracic Surgery Why:  Appointment is at 9:30, please get CXR at 9:00 at [place] located on first floor of our office building Contact information: [place] [address] [phone]        Bartholome Bill, MD. Call in 1 day(s).   Specialty:  Family Medicine Contact information: [place] [address] [place] Follow up.   Why:  rolling walker arranged- to be delivered  to room prior to discharge Contact information: [address] [place] [address] [phone]        [facility] Follow up.   Specialty:  [place] Why:  HHPT arranged- they will call you to set up home visits Contact information: [place] [address] [place], [place], Medical Student 05/23/2017, 2:15 PM MS4, [place] Upper-Level Resident Addendum  I have independently interviewed and examined the patient. I have discussed the above with the original author and agree with their documentation. Please see also any attending notes.   Bufford Lope, DO PGY-2, [facility] 05/23/2017 4:42 PM  [place] Service pager: [phone] (text pages welcome through [place])

## 2017-05-22 NOTE — Progress Notes (Signed)
[facility] Progress Note    Assessment/ Plan:   1. 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 He also had a 2013 study notable for small adrenal nodule. Hyperaldosteronism may be caused by a hypersecreting adrenal adenoma vs. adrenal hyperplasia.   - Less likely thyroid etiology with normal TSH, also cortisol and Urine tox previously WNL/denies drug use - continue amlodipine, PRNs hydral and labetolol, clonidine 0.2 mg TID (Can consider catapres patch 0.2 mg) - spironolactone inc'd to 50 mg BID - consider adding back home lasix, renal function at [facility] of hypertensive workup in process:   - Renin and aldosterone levels - plasma metanephrines and catecholamines  - renal artery duplex  2. CKD IV, stable; BL Cr 2.7-2.8 (12/2014) with AKI this admission, now resolved. Renal function is stable. Continue to monitor daily. Patient follows with nephrology at [facility].  3. T2DM - A1c >15, insulin dependent. Management per primary team.  4. Electrolytes - Stable, continue to monitor.  Subjective:   Patient endorses some drowsiness with in clonidine 0.2 mg yesterday. Otherwise feeling well and in good spirits this AM. He is pleased to have chest tube out.   Objective:   BP (!) 151/103 (BP Location: Left Arm)   Pulse 91   Temp 99.5 F (37.5 C) (Axillary)   Resp (!) 23   Ht 5\' 7"  (1.702 m)   Wt 242 lb 14.4 oz (110.2 kg)   SpO2 96%   BMI 38.04 kg/m   Intake/Output Summary (Last 24 hours) at 05/22/2017 1109 Last data filed at 05/22/2017 0502 Gross per 24 hour  Intake 610 ml  Output 251 ml  Net 359 ml   Weight change: 0 lb (0 kg)  Physical Exam:  Gen:NAD, comfortable CVS:RRR, no m/r/g Resp: CTA bil JJK:KXFG, nt, nd Ext: 0-1+ LE edema bil  Imaging: Dg Chest 2 View  Result Date: 05/22/2017 CLINICAL DATA:   Right chest tube removal.  Right pleural effusion. EXAM: CHEST  2 VIEW COMPARISON:  Chest x-rays dated 05/21/2017 and 05/16/2017 FINDINGS: Right chest tube has been removed. No pneumothorax. Small amount of loculated pleural fluid along the fissures and peripherally in the right hemithorax. Minimal atelectasis at the left lung base. Heart size and vascularity are normal. Central line appears unchanged. IMPRESSION: 1. No pneumothorax after chest tube removal. 2. Small amount of residual loculated pleural fluid on the right. 3. Minimal atelectasis at the left lung base. Electronically Signed   By: Lorriane Shire M.D.   On: 05/22/2017 09:45   Dg Chest Port 1 View  Result Date: 05/21/2017 CLINICAL DATA:  Diminished right breath sounds. Right chest tube in place. EXAM: PORTABLE CHEST 1 VIEW COMPARISON:  05/20/2017, 05/19/2017, and 05/18/2017 FINDINGS: Right-sided chest tube is unchanged. No pneumothorax. Central venous catheter is unchanged appears in good position. No significant change in residual loculated pleural fluid on the right. Left lung is clear.  Heart size and vascularity are normal. IMPRESSION: No significant change in the appearance of the chest since the prior study. Small amount of loculated pleural fluid remains on the right. Electronically Signed   By: Lorriane Shire M.D.   On: 05/21/2017 08:50   Vas US Renal Artery Duplex  Result Date: 05/21/2017 ABDOMINAL VISCERAL Other Indication: Hypertension.  Limitations: Air/bowel gas.  Examination Guidelines: A complete evaluation includes B-mode imaging, spectral doppler, color doppler,  and power doppler as needed of all accessible portions of each vessel. Bilateral testing is considered an integral part of a complete examination. Limited examinations for reoccurring indications may be performed as noted.  Duplex Findings: +--------------------+--------+--------+------+ Mesenteric          PSV cm/sEDV cm/sPlaque  +--------------------+--------+--------+------+ Celiac Artery Origin  110      38          +--------------------+--------+--------+------+ SMA Proximal           97      13          +--------------------+--------+--------+------+   +------------------+--------+--------+-------+ Right Renal ArteryPSV cm/sEDV cm/sComment +------------------+--------+--------+-------+ Origin             44.00   11.00          +------------------+--------+--------+-------+ Proximal           45.30   13.30          +------------------+--------+--------+-------+ Mid                43.60   11.20          +------------------+--------+--------+-------+ Distal             53.80   14.70          +------------------+--------+--------+-------+ +-----------------+--------+--------+-------+ Left Renal ArteryPSV cm/sEDV cm/sComment +-----------------+--------+--------+-------+ Origin            46.00    9.00          +-----------------+--------+--------+-------+ Proximal          45.80   10.50          +-----------------+--------+--------+-------+ MId               28.50    7.81          +-----------------+--------+--------+-------+ Distal            29.50   12.10          +-----------------+--------+--------+-------+ RT Kidney Length 11.40  LT Kidney Length 10.30 +------------+--------+--------+----+-----------+-----+----+----+ Right KidneyPSV cm/sEDV cm/s RI Left Kidney PSV EDV  RI  +------------+--------+--------+----+-----------+-----+----+----+ Upper Pole   34.80   11.20  0.71Upper Pole 17.805.220.77 +------------+--------+--------+----+-----------+-----+----+----+ Mid          20.20    5.59  0.74    Mid    005.005.005.005 +------------+--------+--------+----+-----------+-----+----+----+ Lower Pole   23.20    8.60  0.73Lower Pole [id number] +------------+--------+--------+----+-----------+-----+----+----+ FINAL INTERPRETATION: Renal:  Right: No  evidence of right renal artery stenosis. Abnormal right        Resistive Index. Left:  No evidence of left renal artery stenosis. Abnormal left        Resisitve Index. *See table(s) above for measurements and observations.  Diagnosing physician: Harold Barban  Electronically signed by Harold Barban on 05/21/2017 at 12:06:28 PM.     Labs: BMET Recent Labs  Lab 05/16/17 1119 05/16/17 1550 05/18/17 0235 05/19/17 0444 05/20/17 0516 05/21/17 0531 05/22/17 0441  NA 121* 124* 131* 134* 133* 133* 133*  K 5.2* 4.7 4.6 4.3 4.1 4.6 4.5  CL 86* 90* 99* 102 100* 101 101  CO2 19* 20* 18* 19* 20* 21* 21*  GLUCOSE 584* 507* 293* 272* 274* 289* 223*  BUN 48* 48* 51* 45* 39* 38* 36*  CREATININE 3.32* 3.19* 3.00* 2.65* 2.30* 2.26* 2.16*  CALCIUM 9.5 9.2 8.9 8.7* 8.9 9.1 9.0  PHOS  --   --  5.2* 4.4  --   --   --    CBC Recent Labs  Lab 05/16/17  1119  05/19/17 0444 05/20/17 0516 05/21/17 0531 05/22/17 0441  WBC 15.5*   < > 14.2* 13.2* 12.5* 13.2*  NEUTROABS 12.8*  --   --   --   --   --   HGB 12.4*   < > 11.4* 12.1* 11.5* 10.5*  HCT 36.0*   < > 34.4* 35.9* 35.3* 33.0*  MCV 85.1   < > 87.8 87.1 88.0 88.9  PLT 420*   < > 507* 549* 555* 545*   < > = values in this interval not displayed.    Medications:    . acetaminophen  1,000 mg Oral Q6H   Or  . acetaminophen (TYLENOL) oral liquid 160 mg/5 mL  1,000 mg Oral Q6H  . amLODipine  10 mg Oral Daily  . aspirin  81 mg Oral Daily  . bisacodyl  10 mg Oral Daily  . cloNIDine  0.2 mg Oral TID  . gabapentin  300 mg Oral BID  . guaiFENesin  600 mg Oral BID  . heparin injection (subcutaneous)  5,000 Units Subcutaneous Q8H  . insulin aspart  0-20 Units Subcutaneous TID WC  . insulin aspart  0-5 Units Subcutaneous QHS  . insulin glargine  20 Units Subcutaneous Daily  . labetalol  200 mg Oral BID  . senna-docusate  1 tablet Oral QHS  . spironolactone  50 mg Oral BID    Everrett Coombe, MD PGY2 05/22/2017, 11:09 AM   I have seen and examined this  patient and agree with plan and assessment in the above note with renal recommendations/intervention highlighted.  Doing much better and chest tube has been removed.  Scr below baseline and bp under better control.  Ok to restart outpatient lasix dose and follow bp and renal function. Governor Rooks Logen Heintzelman,MD 05/22/2017 12:37 PM

## 2017-05-22 NOTE — Progress Notes (Signed)
Pt discharged home. IV and telemetry box removed. Pt received discharge instructions and all questions were answered. Pt left with all of his belongings. Pt discharged via wheelchair and was accompanied by pt's nurse tech.   Grant Fontana BSN, RN

## 2017-05-23 LAB — SEROTONIN SERUM: Serotonin, Serum: 96 ng/mL (ref 21–321)

## 2017-05-23 LAB — ALDOSTERONE + RENIN ACTIVITY W/ RATIO
ALDO / PRA RATIO: 6.9 (ref 0.0–30.0)
Aldosterone: 4.2 ng/dL (ref 0.0–30.0)
PRA LC/MS/MS: 0.61 ng/mL/h (ref 0.167–5.380)

## 2017-05-24 DIAGNOSIS — Z48813 Encounter for surgical aftercare following surgery on the respiratory system: Secondary | ICD-10-CM | POA: Diagnosis not present

## 2017-05-24 LAB — 5 HIAA, QUANTITATIVE, URINE, 24 HOUR
5-HIAA, Ur: 1.7 mg/L
5-HIAA,QUANT.,24 HR URINE: 3.2 mg/(24.h) (ref 0.0–14.9)
TOTAL VOLUME: 1900

## 2017-06-03 ENCOUNTER — Telehealth: Payer: Self-pay

## 2017-06-03 NOTE — Telephone Encounter (Signed)
Advance nurse called to report [name] had gained 3 lbs this past week, on 05/31/17 he was 230 lbs and today 233 lbs. His BP today was 120/88, he denies any shortness of breath, Incision sites look good, no fevers. He is currently taking Lasix 120 mg daily and spironolactone 50 mg daily. She will continue to monitor and call back if his weight increases or develops any shortness of breath.

## 2017-06-06 ENCOUNTER — Ambulatory Visit: Payer: Medicare Other | Admitting: Dietician

## 2017-06-11 ENCOUNTER — Other Ambulatory Visit: Payer: Self-pay | Admitting: Family Medicine

## 2017-06-12 ENCOUNTER — Other Ambulatory Visit: Payer: Self-pay | Admitting: Cardiothoracic Surgery

## 2017-06-12 DIAGNOSIS — J90 Pleural effusion, not elsewhere classified: Secondary | ICD-10-CM

## 2017-06-13 ENCOUNTER — Ambulatory Visit
Admission: RE | Admit: 2017-06-13 | Discharge: 2017-06-13 | Disposition: A | Discharge status: Home / Self Care | Payer: Medicare Other | Source: Ambulatory Visit | Attending: Cardiothoracic Surgery | Admitting: Cardiothoracic Surgery

## 2017-06-13 ENCOUNTER — Ambulatory Visit (INDEPENDENT_AMBULATORY_CARE_PROVIDER_SITE_OTHER): Payer: Self-pay | Admitting: Cardiothoracic Surgery

## 2017-06-13 ENCOUNTER — Encounter: Payer: Self-pay | Admitting: Cardiothoracic Surgery

## 2017-06-13 VITALS — BP 120/90 | HR 79 | Resp 20 | Ht 67.0 in | Wt 237.0 lb

## 2017-06-13 DIAGNOSIS — J90 Pleural effusion, not elsewhere classified: Secondary | ICD-10-CM

## 2017-06-13 DIAGNOSIS — Z09 Encounter for follow-up examination after completed treatment for conditions other than malignant neoplasm: Secondary | ICD-10-CM

## 2017-06-13 NOTE — Progress Notes (Signed)
[address]       [place] [address]             [phone]      [name] [place] Medical Record #[id number] Date of Birth: 12-06-1965  Referring: Bartholome Bill, MD Primary Care: Bartholome Bill, MD Primary Cardiologist: No primary care provider on file.   Chief Complaint:   POST OP FOLLOW UP 05/17/2017 OPERATIVE REPORT PREOPERATIVE DIAGNOSIS:  Right empyema. POSTOPERATIVE DIAGNOSIS:  Right empyema. SURGICAL PROCEDURE:  Bronchoscopy, right video-assisted thoracoscopy, mini thoracotomy, evacuation of empyema, and decortication. SURGEON:  Lanelle Bal, MD.  History of Present Illness:      Patient returns to the office today after right empyema drainage.  At the time of admission he was noted to have chronic renal failure and poorly controlled diabetes.  Before discharge he was referred to the outpatient diabetes education class but skipped his appointment.    Past Medical History:  Diagnosis Date  . Acute kidney injury ([place]) 12/28/2014  . Adjustment disorder with mixed anxiety and depressed mood 12/28/2014  . CPAP (continuous positive airway pressure) dependence   . Diabetes mellitus without complication ([place])   . DM (diabetes mellitus) type II controlled with renal manifestation ([place]) 12/28/2014  . DM (diabetes mellitus) type II controlled, neurological manifestation ([place]) 12/28/2014  . Encephalopathy, hypertensive 12/28/2014  . Hypertension   . Hypertensive emergency without congestive heart failure 12/27/2014  . Possible Panic disorder 12/28/2014  . Renal disorder   . 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 Sleep apnea      Social History   Tobacco Use  Smoking Status Never Smoker  Smokeless Tobacco Never Used    Social History   Substance and Sexual Activity  Alcohol Use No     No Known Allergies  Current Outpatient Medications  Medication Sig Dispense Refill  . acetaminophen (TYLENOL) 325 MG tablet Take 2  tablets (650 mg total) by mouth every 6 (six) hours as needed for mild pain, moderate pain or headache. (Patient taking differently: Take 1,000 mg by mouth every 6 (six) hours as needed for mild pain, moderate pain or headache. ) 60 tablet 0  . albuterol (PROVENTIL HFA;VENTOLIN HFA) 108 (90 BASE) MCG/ACT inhaler Inhale 1-2 puffs into the lungs every 6 (six) hours as needed for wheezing or shortness of breath.    Marland Kitchen amLODipine (NORVASC) 10 MG tablet Take 1 tablet (10 mg total) by mouth daily. 30 tablet 0  . aspirin EC 81 MG tablet Take 81 mg by mouth daily.    . cloNIDine (CATAPRES - DOSED IN MG/24 HR) 0.2 mg/24hr patch Place 1 patch (0.2 mg total) onto the skin once a week. 4 patch 0  . furosemide (LASIX) 80 MG tablet Take 120 mg by mouth daily.    Marland Kitchen gabapentin (NEURONTIN) 300 MG capsule Take 300 mg by mouth 2 (two) times daily.    . insulin aspart (NOVOLOG) 100 UNIT/ML injection Before each meal 3 times a day, 140-199 - 2 units, 200-250 - 6 units, 251-299 - 8 units,  300-349 - 10 units,  350 or above 12 units. Dispense syringes and needles as needed, Ok to switch to PEN if approved. (Patient taking differently: Inject 2-12 Units into the skin 3 (three) times daily with meals. Before each meal 3 times a day, 140-199 - 2 units, 200-250 - 6 units, 251-299 - 8 units,  300-349 - 10 units,  350 or above 12 units. Sliding scale) 1 vial  12  . insulin glargine (LANTUS) 100 UNIT/ML injection Inject 0.2 mLs (20 Units total) into the skin daily. 10 mL 0  . labetalol (NORMODYNE) 200 MG tablet Take 200 mg by mouth 2 (two) times daily.    . potassium chloride SA (K-DUR,KLOR-CON) 20 MEQ tablet Take 20 mEq by mouth 2 (two) times daily.    Marland Kitchen spironolactone (ALDACTONE) 50 MG tablet Take 1 tablet (50 mg total) by mouth 2 (two) times daily. 60 tablet 0  . tiZANidine (ZANAFLEX) 4 MG tablet Take 4 mg by mouth every 8 (eight) hours as needed.  0  . HYDROcodone-acetaminophen (NORCO/VICODIN) 5-325 MG tablet Take 1 tablet by  mouth daily as needed.  0   No current facility-administered medications for this visit.        Physical Exam: BP 120/90   Pulse 79   Resp 20   Ht 5\' 7"  (1.702 m)   Wt 237 lb (107.5 kg) Comment: with shoes  SpO2 96% Comment: RA  BMI 37.12 kg/m   General appearance: alert, cooperative and appears older than stated age Neurologic: intact Heart: regular rate and rhythm, S1, S2 normal, no murmur, click, rub or gallop Lungs: diminished breath sounds bibasilar Abdomen: soft, non-tender; bowel sounds normal; no masses,  no organomegaly Extremities: extremities normal, atraumatic, no cyanosis or edema Wound: Right chest tube sites incision healing well chest tube sutures were removed   Diagnostic Studies & Laboratory data:     Recent Radiology Findings:   Dg Chest 2 View  Result Date: 06/13/2017 CLINICAL DATA:  Right-sided chest pain and shortness of breath. EXAM: CHEST - 2 VIEW COMPARISON:  Chest x-ray dated May 22, 2017. FINDINGS: The heart size and mediastinal contours are within normal limits. Normal pulmonary vascularity. Volume loss and scarring in the right lower lobe. Small amount of loculated fluid about the right minor and major fissures and peripheral right lower on has improved when compared to prior study. No focal consolidation or pneumothorax. No acute osseous abnormality. IMPRESSION: 1.  No active cardiopulmonary disease. 2. Volume loss and scarring in the right lung with improved loculated pleural fluid on the right. Electronically Signed   By: Titus Dubin M.D.   On: 06/13/2017 09:32   I have independently reviewed the above radiology studies  and reviewed the findings with the patient.    Recent Lab Findings: Lab Results  Component Value Date   WBC 13.2 (H) 05/22/2017   HGB 10.5 (L) 05/22/2017   HCT 33.0 (L) 05/22/2017   PLT 545 (H) 05/22/2017   GLUCOSE 223 (H) 05/22/2017   ALT 17 05/19/2017   AST 19 05/19/2017   NA 133 (L) 05/22/2017   K 4.5  05/22/2017   CL 101 05/22/2017   CREATININE 2.16 (H) 05/22/2017   BUN 36 (H) 05/22/2017   CO2 21 (L) 05/22/2017   TSH 0.385 05/21/2017   INR 1.14 05/16/2017   HGBA1C >15.5 (H) 05/16/2017      Assessment / Plan:      Patient stable after recent drainage of right empyema Still needs aggressive blood pressure and diabetes treatments.  I have again reinforced to the patient how important this is with his underlying medical conditions Plan to see him back in the surgery office as needed Cautioned about heavy lifting for 3-4 more weeks Was allowed to return to driving when no longer using any narcotics   Grace Isaac MD      [address] [place] [address] Office [phone]  Beeper [phone]  06/13/2017 10:07 AM

## 2017-06-14 LAB — FUNGUS CULTURE WITH STAIN

## 2017-06-14 LAB — FUNGAL ORGANISM REFLEX

## 2017-06-14 LAB — FUNGUS CULTURE RESULT

## 2017-06-19 LAB — FUNGUS CULTURE WITH STAIN

## 2017-06-19 LAB — FUNGAL ORGANISM REFLEX

## 2017-06-19 LAB — FUNGUS CULTURE RESULT

## 2017-07-01 LAB — ACID FAST CULTURE WITH REFLEXED SENSITIVITIES
ACID FAST CULTURE - AFSCU3: NEGATIVE
ACID FAST CULTURE - AFSCU3: NEGATIVE

## 2017-07-01 LAB — ACID FAST CULTURE WITH REFLEXED SENSITIVITIES (MYCOBACTERIA)

## 2017-07-08 ENCOUNTER — Other Ambulatory Visit: Payer: Self-pay | Admitting: Family Medicine

## 2017-07-27 ENCOUNTER — Inpatient Hospital Stay (HOSPITAL_COMMUNITY)
Admission: EM | Admit: 2017-07-27 | Discharge: 2017-07-29 | DRG: 194 | Disposition: A | Discharge status: Home / Self Care | Payer: Medicare Other | Attending: Internal Medicine | Admitting: Internal Medicine

## 2017-07-27 ENCOUNTER — Encounter (HOSPITAL_COMMUNITY): Payer: Self-pay | Admitting: Emergency Medicine

## 2017-07-27 ENCOUNTER — Other Ambulatory Visit: Payer: Self-pay

## 2017-07-27 ENCOUNTER — Encounter (HOSPITAL_COMMUNITY): Payer: Self-pay

## 2017-07-27 ENCOUNTER — Ambulatory Visit (HOSPITAL_COMMUNITY)
Admission: EM | Admit: 2017-07-27 | Discharge: 2017-07-27 | Disposition: A | Discharge status: Home / Self Care | Payer: Medicare Other | Attending: Family Medicine | Admitting: Family Medicine

## 2017-07-27 ENCOUNTER — Ambulatory Visit (INDEPENDENT_AMBULATORY_CARE_PROVIDER_SITE_OTHER): Payer: Medicare Other

## 2017-07-27 DIAGNOSIS — N289 Disorder of kidney and ureter, unspecified: Secondary | ICD-10-CM

## 2017-07-27 DIAGNOSIS — R109 Unspecified abdominal pain: Secondary | ICD-10-CM | POA: Diagnosis present

## 2017-07-27 DIAGNOSIS — E1142 Type 2 diabetes mellitus with diabetic polyneuropathy: Secondary | ICD-10-CM | POA: Diagnosis present

## 2017-07-27 DIAGNOSIS — J069 Acute upper respiratory infection, unspecified: Secondary | ICD-10-CM

## 2017-07-27 DIAGNOSIS — E1122 Type 2 diabetes mellitus with diabetic chronic kidney disease: Secondary | ICD-10-CM | POA: Diagnosis present

## 2017-07-27 DIAGNOSIS — Z794 Long term (current) use of insulin: Secondary | ICD-10-CM

## 2017-07-27 DIAGNOSIS — R7989 Other specified abnormal findings of blood chemistry: Secondary | ICD-10-CM

## 2017-07-27 DIAGNOSIS — I129 Hypertensive chronic kidney disease with stage 1 through stage 4 chronic kidney disease, or unspecified chronic kidney disease: Secondary | ICD-10-CM | POA: Diagnosis present

## 2017-07-27 DIAGNOSIS — F419 Anxiety disorder, unspecified: Secondary | ICD-10-CM | POA: Diagnosis present

## 2017-07-27 DIAGNOSIS — E669 Obesity, unspecified: Secondary | ICD-10-CM | POA: Diagnosis present

## 2017-07-27 DIAGNOSIS — R531 Weakness: Secondary | ICD-10-CM

## 2017-07-27 DIAGNOSIS — G4733 Obstructive sleep apnea (adult) (pediatric): Secondary | ICD-10-CM | POA: Diagnosis present

## 2017-07-27 DIAGNOSIS — E875 Hyperkalemia: Secondary | ICD-10-CM

## 2017-07-27 DIAGNOSIS — N189 Chronic kidney disease, unspecified: Secondary | ICD-10-CM

## 2017-07-27 DIAGNOSIS — N179 Acute kidney failure, unspecified: Secondary | ICD-10-CM | POA: Diagnosis present

## 2017-07-27 DIAGNOSIS — E860 Dehydration: Secondary | ICD-10-CM | POA: Diagnosis present

## 2017-07-27 DIAGNOSIS — R05 Cough: Secondary | ICD-10-CM | POA: Diagnosis not present

## 2017-07-27 DIAGNOSIS — R0789 Other chest pain: Secondary | ICD-10-CM

## 2017-07-27 DIAGNOSIS — M791 Myalgia, unspecified site: Secondary | ICD-10-CM | POA: Diagnosis present

## 2017-07-27 DIAGNOSIS — R10A0 Flank pain, unspecified side: Secondary | ICD-10-CM | POA: Diagnosis present

## 2017-07-27 DIAGNOSIS — J101 Influenza due to other identified influenza virus with other respiratory manifestations: Secondary | ICD-10-CM | POA: Diagnosis not present

## 2017-07-27 DIAGNOSIS — B9729 Other coronavirus as the cause of diseases classified elsewhere: Secondary | ICD-10-CM | POA: Diagnosis present

## 2017-07-27 DIAGNOSIS — N186 End stage renal disease: Secondary | ICD-10-CM | POA: Diagnosis present

## 2017-07-27 DIAGNOSIS — Z7982 Long term (current) use of aspirin: Secondary | ICD-10-CM

## 2017-07-27 DIAGNOSIS — N185 Chronic kidney disease, stage 5: Secondary | ICD-10-CM | POA: Diagnosis present

## 2017-07-27 DIAGNOSIS — N183 Chronic kidney disease, stage 3 (moderate): Secondary | ICD-10-CM | POA: Diagnosis present

## 2017-07-27 DIAGNOSIS — Z79899 Other long term (current) drug therapy: Secondary | ICD-10-CM

## 2017-07-27 DIAGNOSIS — F329 Major depressive disorder, single episode, unspecified: Secondary | ICD-10-CM | POA: Diagnosis present

## 2017-07-27 DIAGNOSIS — Z6836 Body mass index (BMI) 36.0-36.9, adult: Secondary | ICD-10-CM

## 2017-07-27 LAB — COMPREHENSIVE METABOLIC PANEL
ALT: 14 U/L — ABNORMAL LOW (ref 17–63)
ANION GAP: 11 (ref 5–15)
AST: 14 U/L — ABNORMAL LOW (ref 15–41)
Albumin: 3.7 g/dL (ref 3.5–5.0)
Alkaline Phosphatase: 72 U/L (ref 38–126)
BUN: 75 mg/dL — ABNORMAL HIGH (ref 6–20)
CHLORIDE: 98 mmol/L — AB (ref 101–111)
CO2: 20 mmol/L — ABNORMAL LOW (ref 22–32)
Calcium: 9.5 mg/dL (ref 8.9–10.3)
Creatinine, Ser: 4.29 mg/dL — ABNORMAL HIGH (ref 0.61–1.24)
GFR calc non Af Amer: 15 mL/min — ABNORMAL LOW (ref >60)
GFR, EST AFRICAN AMERICAN: 17 mL/min — AB (ref >60)
Glucose, Bld: 205 mg/dL — ABNORMAL HIGH (ref 65–99)
POTASSIUM: 5.7 mmol/L — AB (ref 3.5–5.1)
Sodium: 129 mmol/L — ABNORMAL LOW (ref 135–145)
Total Bilirubin: 0.4 mg/dL (ref 0.3–1.2)
Total Protein: 8.5 g/dL — ABNORMAL HIGH (ref 6.5–8.1)

## 2017-07-27 LAB — CBC WITH DIFFERENTIAL/PLATELET
Basophils Absolute: 0 10*3/uL (ref 0.0–0.1)
Basophils Relative: 0 %
EOS ABS: 0 10*3/uL (ref 0.0–0.7)
Eosinophils Relative: 0 %
HCT: 38.8 % — ABNORMAL LOW (ref 39.0–52.0)
HEMOGLOBIN: 13.2 g/dL (ref 13.0–17.0)
LYMPHS ABS: 2.2 10*3/uL (ref 0.7–4.0)
Lymphocytes Relative: 42 %
MCH: 29.5 pg (ref 26.0–34.0)
MCHC: 34 g/dL (ref 30.0–36.0)
MCV: 86.8 fL (ref 78.0–100.0)
Monocytes Absolute: 0.2 10*3/uL (ref 0.1–1.0)
Monocytes Relative: 5 %
NEUTROS PCT: 53 %
Neutro Abs: 2.8 10*3/uL (ref 1.7–7.7)
Platelets: 349 10*3/uL (ref 150–400)
RBC: 4.47 MIL/uL (ref 4.22–5.81)
RDW: 13.8 % (ref 11.5–15.5)
WBC: 5.3 10*3/uL (ref 4.0–10.5)

## 2017-07-27 LAB — POCT I-STAT, CHEM 8
BUN: 72 mg/dL — ABNORMAL HIGH (ref 6–20)
CALCIUM ION: 1.12 mmol/L — AB (ref 1.15–1.40)
CREATININE: 4.3 mg/dL — AB (ref 0.61–1.24)
Chloride: 99 mmol/L — ABNORMAL LOW (ref 101–111)
GLUCOSE: 167 mg/dL — AB (ref 65–99)
HCT: 42 % (ref 39.0–52.0)
HEMOGLOBIN: 14.3 g/dL (ref 13.0–17.0)
Potassium: 5.4 mmol/L — ABNORMAL HIGH (ref 3.5–5.1)
Sodium: 131 mmol/L — ABNORMAL LOW (ref 135–145)
TCO2: 24 mmol/L (ref 22–32)

## 2017-07-27 LAB — I-STAT CG4 LACTIC ACID, ED
Lactic Acid, Venous: 1.08 mmol/L (ref 0.5–1.9)
Lactic Acid, Venous: 1.08 mmol/L (ref 0.5–1.9)

## 2017-07-27 LAB — I-STAT TROPONIN, ED: Troponin i, poc: 0.01 ng/mL (ref 0.00–0.08)

## 2017-07-27 LAB — PHOSPHORUS: PHOSPHORUS: 5.3 mg/dL — AB (ref 2.5–4.6)

## 2017-07-27 LAB — MAGNESIUM: MAGNESIUM: 2.8 mg/dL — AB (ref 1.7–2.4)

## 2017-07-27 LAB — CBG MONITORING, ED: GLUCOSE-CAPILLARY: 163 mg/dL — AB (ref 65–99)

## 2017-07-27 MED ORDER — SODIUM CHLORIDE 0.9 % IV BOLUS
1000.0000 mL | Freq: Once | INTRAVENOUS | Status: AC
  Administered 2017-07-27: 1000 mL via INTRAVENOUS

## 2017-07-27 MED ORDER — SODIUM CHLORIDE 0.9 % IV SOLN: Freq: Once | INTRAVENOUS | Status: DC

## 2017-07-27 MED ORDER — INSULIN ASPART 100 UNIT/ML ~~LOC~~ SOLN
0.0000 [IU] | Freq: Three times a day (TID) | SUBCUTANEOUS | Status: DC
  Administered 2017-07-28: 5 [IU] via SUBCUTANEOUS
  Administered 2017-07-28: 3 [IU] via SUBCUTANEOUS
  Administered 2017-07-29: 8 [IU] via SUBCUTANEOUS

## 2017-07-27 MED ORDER — ALBUTEROL SULFATE (2.5 MG/3ML) 0.083% IN NEBU: 2.5000 mL | INHALATION_SOLUTION | Freq: Four times a day (QID) | RESPIRATORY_TRACT | Status: DC | PRN

## 2017-07-27 MED ORDER — SERTRALINE HCL 100 MG PO TABS
100.0000 mg | ORAL_TABLET | Freq: Every day | ORAL | Status: DC
  Administered 2017-07-27 – 2017-07-29 (×3): 100 mg via ORAL
  Filled 2017-07-27 (×4): qty 1

## 2017-07-27 MED ORDER — ACETAMINOPHEN 325 MG PO TABS: 650.0000 mg | ORAL_TABLET | Freq: Four times a day (QID) | ORAL | Status: DC | PRN

## 2017-07-27 MED ORDER — SODIUM POLYSTYRENE SULFONATE 15 GM/60ML PO SUSP
15.0000 g | Freq: Once | ORAL | Status: AC
  Administered 2017-07-27: 15 g via ORAL
  Filled 2017-07-27: qty 60

## 2017-07-27 MED ORDER — SODIUM CHLORIDE 0.9 % IV SOLN
INTRAVENOUS | Status: AC
  Administered 2017-07-27 – 2017-07-28 (×2): via INTRAVENOUS

## 2017-07-27 MED ORDER — SODIUM CHLORIDE 0.9 % IV BOLUS: 500.0000 mL | Freq: Once | INTRAVENOUS | Status: DC

## 2017-07-27 MED ORDER — TIZANIDINE HCL 4 MG PO TABS
4.0000 mg | ORAL_TABLET | Freq: Three times a day (TID) | ORAL | Status: DC | PRN
  Administered 2017-07-28: 4 mg via ORAL
  Filled 2017-07-27 (×3): qty 1

## 2017-07-27 MED ORDER — INSULIN GLARGINE 100 UNIT/ML ~~LOC~~ SOLN
6.0000 [IU] | Freq: Every day | SUBCUTANEOUS | Status: DC
  Administered 2017-07-27 – 2017-07-28 (×2): 6 [IU] via SUBCUTANEOUS
  Filled 2017-07-27 (×2): qty 0.06

## 2017-07-27 MED ORDER — ENOXAPARIN SODIUM 30 MG/0.3ML ~~LOC~~ SOLN
30.0000 mg | SUBCUTANEOUS | Status: DC
  Administered 2017-07-28 – 2017-07-29 (×2): 30 mg via SUBCUTANEOUS
  Filled 2017-07-27 (×3): qty 0.3

## 2017-07-27 NOTE — ED Triage Notes (Signed)
Pt states he was seen today at [facility] for cough and increasing pain in right chest where he had a chest tube X1 month ago for "fluid on the lung". Pt also c/o feeling weak X 1 week.

## 2017-07-27 NOTE — ED Provider Notes (Signed)
[facility] EMERGENCY DEPARTMENT Provider Note   CSN: [id number] Arrival date & time: 07/27/17  1610     History   Chief Complaint Chief Complaint  Patient presents with  . Abnormal Lab  . Cough    HPI [name] is a 52 y.o. male.  HPI [name] is a 52 y.o. male presents to ED with complaint of cough, weakness, right sided lung pain. Symptoms started after his hospitalization a month ago, was treated for parapneumonic effusion requiring VATS and min thoracotomy. Pt states he has had pain and weakness since dc home, but really worsened in the last week.  Reports pain, generalized weakness, not eating or drinking.  Reports associated shortness of breath and dizziness.  Denies any fever.  Persistent mild cough.  No vomiting.  No diarrhea.  No abdominal pain.  Family member state that patient was a little confused and had wrong medications and his medication slot pillbox, which is what made him concerned and brought him to the UC.  Was evaluated at urgent care, was found to have elevated renal function, normal chest x-ray, sent here for further evaluation.  Patient states current complaint are pain to the right ribs from surgical site, as well as generalized weakness.  Past Medical History:  Diagnosis Date  . Acute kidney injury ([place]) 12/28/2014  . Adjustment disorder with mixed anxiety and depressed mood 12/28/2014  . CPAP (continuous positive airway pressure) dependence   . Diabetes mellitus without complication ([place])   . DM (diabetes mellitus) type II controlled with renal manifestation ([place]) 12/28/2014  . DM (diabetes mellitus) type II controlled, neurological manifestation ([place]) 12/28/2014  . Encephalopathy, hypertensive 12/28/2014  . Hypertension   . Hypertensive emergency without congestive heart failure 12/27/2014  . Possible Panic disorder 12/28/2014  . Renal disorder   . 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 Sleep apnea     Patient Active Problem List     Diagnosis Date Noted  . Pneumonia of right lung due to infectious organism   . Pleural effusion   . Uncontrolled hypertension   . Post-operative pain   . Diabetes mellitus type 2 in nonobese (HCC)   . Acute diastolic congestive heart failure ([place])   . Encephalopathy   . Stage 4 chronic kidney disease ([place])   . SIRS (systemic inflammatory response syndrome) (H[facility])   . Chest tube in place   . 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[place])   . Postoperative pain   . Pleural effusion on right 05/17/2017  . Empyema ([place]) 05/16/2017  . Encephalopathy, hypertensive 12/28/2014  . Acute kidney injury ([place]) 12/28/2014  . Type 2 diabetes mellitus with hyperglycemia, with long-term current use of insulin ([place]) 12/28/2014  . DM (diabetes mellitus) type II controlled, neurological manifestation ([place]) 12/28/2014  . Adjustment disorder with mixed anxiety and depressed mood 12/28/2014  . Possible Panic disorder 12/28/2014  . Hypertensive emergency without congestive heart failure 12/27/2014  . Hyponatremia 06/27/2013  . Essential hypertension, malignant 06/27/2013  . Hyperglycemia 06/26/2013  . DKA (diabetic ketoacidoses) ([place]) 06/26/2013  . Severe uncontrolled hypertension 03/31/2011  . Hypokalemia 03/28/2011  . Essential hypertension 03/28/2011  . CKD (chronic kidney disease) stage 3, GFR 30-59 ml/min ([place]) 03/28/2011    Past Surgical History:  Procedure Laterality Date  . EMPYEMA DRAINAGE Right 05/17/2017   Procedure: EMPYEMA DRAINAGE AND DECORTICATION;  Surgeon: Grace Isaac, MD;  Location: [facility];  Service: Thoracic;  Laterality: Right;  . FLEXIBLE BRONCHOSCOPY  05/17/2017   Procedure: FLEXIBLE BRONCHOSCOPY;  Surgeon: Servando Snare,  Lilia Argue, MD;  Location: [facility];  Service: Thoracic;;  . IR THORACENTESIS ASP PLEURAL SPACE W/IMG GUIDE  05/17/2017  . NO PAST SURGERIES    . THORACOTOMY/LOBECTOMY Right 05/17/2017   Procedure: RIGHT MINI THORACOTOMY;  Surgeon: Grace Isaac, MD;  Location: [facility];  Service: Thoracic;   Laterality: Right;  Marland Kitchen VIDEO ASSISTED THORACOSCOPY (VATS)/EMPYEMA Right 05/17/2017   Procedure: RIGHT VIDEO ASSISTED THORACOSCOPY (VATS)/EMPYEMA;  Surgeon: Grace Isaac, MD;  Location[facility];  Service: Thoracic;  Laterality: Right;        Home Medications    Prior to Admission medications   Medication Sig Start Date End Date Taking? Authorizing Provider  acetaminophen (TYLENOL) 325 MG tablet Take 2 tablets (650 mg total) by mouth every 6 (six) hours as needed for mild pain, moderate pain or headache. Patient taking differently: Take 1,000 mg by mouth every 6 (six) hours as needed for mild pain, moderate pain or headache.  04/04/17   Waynetta Pean, PA-C  albuterol (PROVENTIL HFA;VENTOLIN HFA) 108 (90 BASE) MCG/ACT inhaler Inhale 1-2 puffs into the lungs every 6 (six) hours as needed for wheezing or shortness of breath.    [provider]  amLODipine (NORVASC) 10 MG tablet Take 1 tablet (10 mg total) by mouth daily. 12/29/14   Smiley Houseman, MD  aspirin EC 81 MG tablet Take 81 mg by mouth daily.    [provider]  cloNIDine (CATAPRES - DOSED IN MG/24 HR) 0.2 mg/24hr patch Place 1 patch (0.2 mg total) onto the skin once a week. 05/22/17   Bufford Lope, DO  furosemide (LASIX) 80 MG tablet Take 120 mg by mouth daily.    [provider]  gabapentin (NEURONTIN) 300 MG capsule Take 300 mg by mouth 2 (two) times daily.    [provider]  HYDROcodone-acetaminophen (NORCO/VICODIN) 5-325 MG tablet Take 1 tablet by mouth daily as needed. 04/12/17   [provider]  insulin aspart (NOVOLOG) 100 UNIT/ML injection Before each meal 3 times a day, 140-199 - 2 units, 200-250 - 6 units, 251-299 - 8 units,  300-349 - 10 units,  350 or above 12 units. Dispense syringes and needles as needed, Ok to switch to PEN if approved. Patient taking differently: Inject 2-12 Units into the skin 3 (three) times daily with meals. Before each meal 3 times a day, 140-199 - 2  units, 200-250 - 6 units, 251-299 - 8 units,  300-349 - 10 units,  350 or above 12 units. Sliding scale 06/28/13   Thurnell Lose, MD  insulin glargine (LANTUS) 100 UNIT/ML injection Inject 0.2 mLs (20 Units total) into the skin daily. 05/22/17   Bufford Lope, DO  labetalol (NORMODYNE) 200 MG tablet Take 200 mg by mouth 2 (two) times daily. 12/14/15   [provider]  potassium chloride SA (K-DUR,KLOR-CON) 20 MEQ tablet Take 20 mEq by mouth 2 (two) times daily.    [provider]  spironolactone (ALDACTONE) 50 MG tablet Take 1 tablet (50 mg total) by mouth 2 (two) times daily. 05/23/17   Bufford Lope, DO  tiZANidine (ZANAFLEX) 4 MG tablet Take 4 mg by mouth every 8 (eight) hours as needed. 04/08/17   [provider]    Family History Family History  Problem Relation Age of Onset  . Hypertension Mother     Social History Social History   Tobacco Use  . Smoking status: Never Smoker  . Smokeless tobacco: Never Used  Substance Use Topics  .  Alcohol use: No  . Drug use: No     Allergies   Patient has no known allergies.   Review of Systems Review of Systems  Constitutional: Positive for appetite change and fatigue. Negative for chills and fever.  Respiratory: Positive for cough. Negative for chest tightness and shortness of breath.   Cardiovascular: Positive for chest pain. Negative for palpitations and leg swelling.  Gastrointestinal: Negative for abdominal distention, abdominal pain, diarrhea, nausea and vomiting.  Genitourinary: Negative for dysuria, frequency, hematuria and urgency.  Musculoskeletal: Negative for arthralgias, myalgias, neck pain and neck stiffness.  Skin: Negative for rash.  Allergic/Immunologic: Negative for immunocompromised state.  Neurological: Positive for weakness. Negative for dizziness, light-headedness and headaches.  All other systems reviewed and are negative.    Physical Exam Updated Vital Signs BP 104/83 (BP  Location: Right Arm)   Pulse 66   Temp 97.6 F (36.4 C) (Oral)   Ht _0  (1.702 m)   Wt 104.3 kg (230 lb)   SpO2 100%   BMI 36.02 kg/m   Physical Exam  Constitutional: He is oriented to person, place, and time. He appears well-developed and well-nourished. No distress.  HENT:  Head: Normocephalic and atraumatic.  Oral mucosa dry  Eyes: Pupils are equal, round, and reactive to light. Conjunctivae and EOM are normal.  Neck: Neck supple.  Cardiovascular: Normal rate, regular rhythm and normal heart sounds.  Pulmonary/Chest: Effort normal. No respiratory distress. He has no wheezes. He has no rales. He exhibits tenderness.  Healed right lower chest/rib incision with ttp.   Abdominal: Soft. Bowel sounds are normal. He exhibits no distension. There is no tenderness. There is no rebound.  Musculoskeletal: He exhibits no edema.  Neurological: He is alert and oriented to person, place, and time.  Skin: Skin is warm and dry.  Nursing note and vitals reviewed.    ED Treatments / Results  Labs (all labs ordered are listed, but only abnormal results are displayed) Labs Reviewed  COMPREHENSIVE METABOLIC PANEL - Abnormal; Notable for the following components:      Result Value   Sodium 129 (*)    Potassium 5.7 (*)    Chloride 98 (*)    CO2 20 (*)    Glucose, Bld 205 (*)    BUN 75 (*)    Creatinine, Ser 4.29 (*)    Total Protein 8.5 (*)    AST 14 (*)    ALT 14 (*)    GFR calc non Af Amer 15 (*)    GFR calc Af Amer 17 (*)    All other components within normal limits  CBC WITH DIFFERENTIAL/PLATELET - Abnormal; Notable for the following components:   HCT 38.8 (*)    All other components within normal limits    EKG EKG Interpretation  Date/Time:  Saturday July 27 2017 17:10:16 EDT Ventricular Rate:  66 PR Interval:  198 QRS Duration: 88 QT Interval:  416 QTC Calculation: 436 R Axis:   -22 Text Interpretation:  Normal sinus rhythm Normal ECG When compared to prior, slightly  sharper t waves.  No STEMI Confirmed by Antony Blackbird [phone]) on 07/27/2017 7:02:49 PM   Radiology Dg Chest 2 View  Result Date: 07/27/2017 CLINICAL DATA:  C/o head and chest congestion, cough, rhinitis, and weakness onset one week. Also states had infection in right lung one month and had surgery to clean out infection, now c/o right side pain at surgical site. Non-smoker. HX of htn, pneumonia, diabetes. EXAM: CHEST - 2 VIEW  COMPARISON:  06/13/2016 FINDINGS: The heart size and mediastinal contours are within normal limits. Both lungs are clear. Degenerative changes are seen in thoracic spine. IMPRESSION: No active cardiopulmonary disease. Electronically Signed   By: Nolon Nations M.D.   On: 07/27/2017 15:10    Procedures Procedures (including critical care time)  Medications Ordered in ED Medications  sodium polystyrene (KAYEXALATE) 15 GM/60ML suspension 15 g (has no administration in time range)  sodium chloride 0.9 % bolus 1,000 mL (has no administration in time range)     Initial Impression / Assessment and Plan / ED Course  I have reviewed the triage vital signs and the nursing notes.  Pertinent labs & imaging results that were available during my care of the patient were reviewed by me and considered in my medical decision making (see chart for details).     Pt with generalized weakness since recent hospitalization for empyema with VATS and thoracotamy, states never felt better since dc and now feeling weaker. Pt appears to be dry.  Appears to be weak.  Patient's CMP shows a creatinine of 4.29, BUN 75, patient's baseline creatinine is around 2.5.  This is significantly elevated from his baseline.  Potassium 5.7 with some peaked T waves.  Will give Kayexalate and fluids.  His WBC is normal.  His chest x-ray from urgent care was normal.  I will add blood cultures, lactic acid, troponin given chest pain and weakness.  VS all within normal. Plan to admit for further  treatment.   Spoke with hospitalist, they will admit patient.  Vitals:   07/27/17 1658 07/27/17 1700  BP: 104/83   Pulse: 66   Temp: 97.6 F (36.4 C)   TempSrc: Oral   SpO2: 100%   Weight:  104.3 kg (230 lb)  Height:  _0  (1.702 m)    Final Clinical Impressions(s) / ED Diagnoses   Final diagnoses:  Acute on chronic renal insufficiency  Hyperkalemia  Weakness    ED Discharge Orders    None       Jeannett Senior, Hershal Coria 07/27/17 2128    Tegeler, Gwenyth Allegra, MD 07/27/17 [phone]

## 2017-07-27 NOTE — ED Triage Notes (Signed)
C/o head and chest congestion, cough, rhinitis, and weakness onset one week. Also states had infection in right lung one month and had surgery to clean out infection, now c/o right side pain at surgical site

## 2017-07-27 NOTE — ED Notes (Signed)
Pt given turkey sandwich

## 2017-07-27 NOTE — H&P (Addendum)
History and Physical  [name] GQB:[id number] DOB: 05-Jun-1965 DOA: 07/27/2017 1650  PCP: [name] Bill, MD  Outpatient Specialists:  [facility] Nephrology (Dr. Kern [name]) and [facility] Family medicine [name])  Chief Complaint:  Myalgia x 1 week  HPI: [name] is a 52 y.o. 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 He reports that he has had R flank/back pain at site of VATS since discharged from hospital in February but otherwise has been recovering, able to ambulate without walker and slowly making progress in getting back to his baseline until last week. He describes waking up "one morning" and feeling weak "all over" about 5-7 days ago. Muscle aches in his back R>L, legs, and shoulders with intermittent sharp cramping sensation bilateral lower back but right > left. No focal weakness. He has had a significant decrease in appetite in the past week. Has felt hot and warm intermittently but no documented fevers. Denies chills. He has also noted increasing rhinorrhea and congestion in the past week. Dry non productive cough, which has been ongoing since empyema episode for past few months, but has also increased in frequency. Denies sick contacts. Baseline has neuropathic pain in his feet which is stable. No urinary sx or new GI sx other than poor appetite. States he has been compliant with his insulin (lantus 20 units at nighttime and short acting mealtime insulin) up until tonight's basal dose.   Review of Systems:  + cough, R sided flank/back pain, generalized myalgias + subjectively feeling "hot/warm", no documented fevers + nasal congestion, rhinorrhea - no chest pain, dyspnea on exertion - no edema, PND, orthopnea - no nausea/vomiting; no tarry, melanotic or bloody stools - no  dysuria, increased urinary frequency - no weight changes  Rest of systems reviewed are negative, except as per above history.   Past Medical History:  Diagnosis Date  . Acute kidney injury ([place]) 12/28/2014  . Adjustment disorder with mixed anxiety and depressed mood 12/28/2014  . CPAP (continuous positive airway pressure) dependence   . Diabetes mellitus without complication ([place])   . DM (diabetes mellitus) type II controlled with renal manifestation ([place]) 12/28/2014  . DM (diabetes mellitus) type II controlled, neurological manifestation ([place]) 12/28/2014  . Encephalopathy, hypertensive 12/28/2014  . Hypertension   . Hypertensive emergency without congestive heart failure 12/27/2014  . Possible Panic disorder 12/28/2014  . Renal disorder   . 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 Sleep apnea    Past Surgical History:  Procedure Laterality Date  . EMPYEMA DRAINAGE Right 05/17/2017   Procedure: EMPYEMA DRAINAGE AND DECORTICATION;  Surgeon: Grace Isaac, MD;  Location: [place];  Service: Thoracic;  Laterality: Right;  . FLEXIBLE BRONCHOSCOPY  05/17/2017   Procedure: FLEXIBLE BRONCHOSCOPY;  Surgeon: Grace Isaac, MD;  Location: [place];  Service: Thoracic;;  . IR THORACENTESIS ASP PLEURAL SPACE W/IMG GUIDE  05/17/2017  . NO PAST SURGERIES    . THORACOTOMY/LOBECTOMY Right 05/17/2017   Procedure: RIGHT MINI THORACOTOMY;  Surgeon: Grace Isaac, MD;  Location: [place];  Service: Thoracic;  Laterality: Right;  Marland Kitchen VIDEO ASSISTED THORACOSCOPY (VATS)/EMPYEMA Right 05/17/2017   Procedure: RIGHT VIDEO ASSISTED THORACOSCOPY (VATS)/EMPYEMA;  Surgeon: Grace Isaac, MD;  Location: [facility] OR;  Service: Thoracic;  Laterality: Right;   Social History:  reports that he  has never smoked. He has never used smokeless tobacco. He reports that he does not drink alcohol or use drugs.  No Known Allergies  Family History  Problem Relation Age of Onset  . Hypertension Mother      Prior to Admission medications    Medication Sig Start Date End Date Taking? Authorizing Provider  acetaminophen (TYLENOL) 325 MG tablet Take 2 tablets (650 mg total) by mouth every 6 (six) hours as needed for mild pain, moderate pain or headache. Patient taking differently: Take 1,000 mg by mouth every 6 (six) hours as needed for mild pain, moderate pain or headache.  04/04/17  Yes Waynetta Pean, PA-C  albuterol (PROVENTIL HFA;VENTOLIN HFA) 108 (90 BASE) MCG/ACT inhaler Inhale 1-2 puffs into the lungs every 6 (six) hours as needed for wheezing or shortness of breath.   Yes [provider]  amLODipine (NORVASC) 10 MG tablet Take 1 tablet (10 mg total) by mouth daily. 12/29/14  Yes Smiley Houseman, MD  aspirin EC 81 MG tablet Take 81 mg by mouth daily.   Yes [provider]  cloNIDine (CATAPRES - DOSED IN MG/24 HR) 0.2 mg/24hr patch Place 1 patch (0.2 mg total) onto the skin once a week. 05/22/17  Yes Orson Eva J, DO  furosemide (LASIX) 80 MG tablet Take 120 mg by mouth daily.   Yes [provider]  gabapentin (NEURONTIN) 300 MG capsule Take 300 mg by mouth 2 (two) times daily.   Yes [provider]  insulin aspart (NOVOLOG) 100 UNIT/ML injection Before each meal 3 times a day, 140-199 - 2 units, 200-250 - 6 units, 251-299 - 8 units,  300-349 - 10 units,  350 or above 12 units. Dispense syringes and needles as needed, Ok to switch to PEN if approved. Patient taking differently: Inject 2-12 Units into the skin 3 (three) times daily with meals. Before each meal 3 times a day, 140-199 - 2 units, 200-250 - 6 units, 251-299 - 8 units,  300-349 - 10 units,  350 or above 12 units. Sliding scale 06/28/13  Yes Thurnell Lose, MD  insulin glargine (LANTUS) 100 UNIT/ML injection Inject 0.2 mLs (20 Units total) into the skin daily. Patient taking differently: Inject 16 Units into the skin daily.  05/22/17  Yes Orson Eva J, DO  labetalol (NORMODYNE) 200 MG tablet Take 400 mg by mouth 2 (two) times  daily.  12/14/15  Yes [provider]  potassium chloride SA (K-DUR,KLOR-CON) 20 MEQ tablet Take 20 mEq by mouth every other day.    Yes [provider]  sertraline (ZOLOFT) 100 MG tablet Take 100 mg by mouth daily. 07/04/17  Yes [provider]  spironolactone (ALDACTONE) 50 MG tablet Take 1 tablet (50 mg total) by mouth 2 (two) times daily. 05/23/17  Yes Orson Eva J, DO  tiZANidine (ZANAFLEX) 4 MG tablet Take 4 mg by mouth every 8 (eight) hours as needed for muscle spasms.  04/08/17  Yes [provider]    Temp:  [97.5 F (36.4 C)-97.6 F (36.4 C)] 97.6 F (36.4 C) (04/20 1658) Pulse Rate:  [66-79] 66 (04/20 1658) BP: (96-104)/(71-83) 104/83 (04/20 1658) SpO2:  [100 %] 100 % (04/20 1658) Weight:  [104.3 kg (230 lb)] 104.3 kg (230 lb) (04/20 1700)  PHYSICAL EXAM:  BP 104/83 (BP Location: Right Arm)   Pulse 66   Temp 97.6 F (36.4 C) (Oral)   Ht _0  (1.702 m)   Wt 104.3 kg (230 lb)   SpO2  100%   BMI 36.02 kg/m   GEN obese middle-aged african-american male; resting in bed, appears mildly uncomfortable  HEENT NCAT EOM PERRL; +bilateral conjunctival injection; clear oropharynx, no cervical LAD; dry mucus membranes  JVP estimated 4 cm H2O above RA; no HJR ; no carotid bruits b/l ;  CV regular normal rate; normal S1 and S2; no m/r/g or S3/S4; PMI non displaced; no parasternal heave  RESP CTA b/l; breathing unlabored and symmetric; thoracotomy scar well healed R flank  ABD soft NT ND +normoactive BS  EXT warm throughout b/l; no peripheral edema b/l  PULSES  DP and radials 2+ intact and symmetric  SKIN/MSK  Mild tenderness to palpation over upper and lower back and bilateral legs; no swelling or cysts or obvious wounds; no rashes  NEURO/PSYCH lower extremity motor 5/5 bilateral and symmetric; no focal deficits; normal pupillary reflex   LABS ON ADMISSION:  Basic Metabolic Panel: Recent Labs  Lab 07/27/17 1536 07/27/17 1721  NA 131* 129*  K 5.4*  5.7*  CL 99* 98*  CO2  --  20*  GLUCOSE 167* 205*  BUN 72* 75*  CREATININE 4.30* 4.29*  CALCIUM  --  9.5   CBC: Recent Labs  Lab 07/27/17 1536 07/27/17 1721  WBC  --  5.3  NEUTROABS  --  2.8  HGB 14.3 13.2  HCT 42.0 38.8*  MCV  --  86.8  PLT  --  349   Liver Function Tests: Recent Labs  Lab 07/27/17 1721  AST 14*  ALT 14*  ALKPHOS 72  BILITOT 0.4  PROT 8.5*  ALBUMIN 3.7   No results for input(s): LIPASE, AMYLASE in the last 168 hours. No results for input(s): AMMONIA in the last 168 hours. Coagulation:  Lab Results  Component Value Date   INR 1.14 05/16/2017   INR 1.12 12/27/2014   No results found for: PTT Lactic Acid, Venous:     Component Value Date/Time   LATICACIDVEN 1.08 07/27/2017 2104   Cardiac Enzymes: No results for input(s): CKTOTAL, CKMB, CKMBINDEX, TROPONINI in the last 168 hours.  BNP (last 3 results) No results for input(s): PROBNP in the last 8760 hours. CBG: No results for input(s): GLUCAP in the last 168 hours.  Radiological Exams on Admission: Dg Chest 2 View  Result Date: 07/27/2017 CLINICAL DATA:  C/o head and chest congestion, cough, rhinitis, and weakness onset one week. Also states had infection in right lung one month and had surgery to clean out infection, now c/o right side pain at surgical site. Non-smoker. HX of htn, pneumonia, diabetes. EXAM: CHEST - 2 VIEW COMPARISON:  06/13/2016 FINDINGS: The heart size and mediastinal contours are within normal limits. Both lungs are clear. Degenerative changes are seen in thoracic spine. IMPRESSION: No active cardiopulmonary disease. Electronically Signed   By: Nolon Nations M.D.   On: 07/27/2017 15:10    EKG: Independently reviewed. NSR with prominent T waves but no peaks or PR prolongation  Assessment/Plan [name] is a 52 y.o. 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 He has had persistent R sided flank/back pain at VATS site since procedure, which does not seem to have changed but does endorse new congestion, dry cough, and severe generalized mylagias/weakness. Exam is notable for relative  hypotension systolics [id number] in patient who normally tends to be hypertensive as well as congestion, rhinorrhea, and conjunctival injection. He is afebrile. Overall does not appear toxic although BP are lower than baseline. Labs notable for AKI on top of CKD with Cr 4.3 and hyperkalemia 5s -- likely pre-renal due to dehydration. Lactate 1.0s. CXR does not show pneumonia or effusion. Planning for infectious workup including resp viral panel and continue hydration.   Principal Problem:   Weakness Active Problems:   Right sided Flank pain   Myalgia   # Myalgia, URI sx, weakness x 1 week > unclear etiology, sepsis workup pending. Suspect viral etiology. Lactate 1.1 and CXR normal.  - blood cx; UA with reflex ucx; - resp viral panel and droplet precautions - IV fluids as below  # AKI on CKD with hyperkalemia 5.7. Likely pre-renal due to dehydration > Cr 4.3 on admission. Baseline Cr 2.1-2.5. Received fluid 1L in ED - continue IV fluids at NS 150 cc/hr  - already received kayexelate in ED. No emergent EKG changes - BID BMP  # IDDM on home lantus 20 units and SSI - lantus 6 units tonight given poor appetite and elevated Cr - SSI with meals - if pt eating tomorrow can adjust lantus back to home dose 20 units  # R back/flank pain at VATS site - likely neuropathic and MSK component - prn tylenol for now - if Cr improve, can resume home gabapentin - no NSAIDs give AKI on CKD  # Hx of anxiety/depression - resume home zoloft   DVT Prophylaxis: lovenox (renal dosing)  Code Status:  Full Code Family Communication: none (patient  spoke with wife by phone)  Disposition Plan: admit to observation and pending labs/infectious workup may be admitted  Extended Emergency Contact Information Primary Emergency Contact: Szc[place]ak,[place]  [place] Phone: [phone] Mobile Phone: [phone] Relation: Mother Secondary Emergency Contact: Michail Jewels Mobile Phone: [phone] Relation: Spouse  Time spent: > 50 mins  Colbert Ewing, MD [facility] Pager [phone]  If 7PM-7AM, please contact night-coverage [email or web address] Password [facility] 07/27/2017, 9:35 PM

## 2017-07-27 NOTE — Discharge Instructions (Signed)
Please go to the ER for further evaluation and treatment 
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
## 2017-07-28 DIAGNOSIS — N183 Chronic kidney disease, stage 3 (moderate): Secondary | ICD-10-CM | POA: Diagnosis present

## 2017-07-28 DIAGNOSIS — Z6836 Body mass index (BMI) 36.0-36.9, adult: Secondary | ICD-10-CM | POA: Diagnosis not present

## 2017-07-28 DIAGNOSIS — R531 Weakness: Secondary | ICD-10-CM

## 2017-07-28 DIAGNOSIS — Z794 Long term (current) use of insulin: Secondary | ICD-10-CM | POA: Diagnosis not present

## 2017-07-28 DIAGNOSIS — E860 Dehydration: Secondary | ICD-10-CM | POA: Diagnosis present

## 2017-07-28 DIAGNOSIS — N179 Acute kidney failure, unspecified: Secondary | ICD-10-CM | POA: Diagnosis present

## 2017-07-28 DIAGNOSIS — J101 Influenza due to other identified influenza virus with other respiratory manifestations: Secondary | ICD-10-CM | POA: Diagnosis present

## 2017-07-28 DIAGNOSIS — F329 Major depressive disorder, single episode, unspecified: Secondary | ICD-10-CM | POA: Diagnosis present

## 2017-07-28 DIAGNOSIS — I129 Hypertensive chronic kidney disease with stage 1 through stage 4 chronic kidney disease, or unspecified chronic kidney disease: Secondary | ICD-10-CM | POA: Diagnosis present

## 2017-07-28 DIAGNOSIS — E1142 Type 2 diabetes mellitus with diabetic polyneuropathy: Secondary | ICD-10-CM | POA: Diagnosis present

## 2017-07-28 DIAGNOSIS — B9729 Other coronavirus as the cause of diseases classified elsewhere: Secondary | ICD-10-CM | POA: Diagnosis present

## 2017-07-28 DIAGNOSIS — E1122 Type 2 diabetes mellitus with diabetic chronic kidney disease: Secondary | ICD-10-CM | POA: Diagnosis present

## 2017-07-28 DIAGNOSIS — N186 End stage renal disease: Secondary | ICD-10-CM

## 2017-07-28 DIAGNOSIS — Z79899 Other long term (current) drug therapy: Secondary | ICD-10-CM | POA: Diagnosis not present

## 2017-07-28 DIAGNOSIS — E875 Hyperkalemia: Secondary | ICD-10-CM | POA: Diagnosis present

## 2017-07-28 DIAGNOSIS — E669 Obesity, unspecified: Secondary | ICD-10-CM | POA: Diagnosis present

## 2017-07-28 DIAGNOSIS — Z7982 Long term (current) use of aspirin: Secondary | ICD-10-CM | POA: Diagnosis not present

## 2017-07-28 DIAGNOSIS — F419 Anxiety disorder, unspecified: Secondary | ICD-10-CM | POA: Diagnosis present

## 2017-07-28 DIAGNOSIS — G4733 Obstructive sleep apnea (adult) (pediatric): Secondary | ICD-10-CM | POA: Diagnosis present

## 2017-07-28 LAB — RESPIRATORY PANEL BY PCR
Adenovirus: NOT DETECTED
BORDETELLA PERTUSSIS-RVPCR: NOT DETECTED
CHLAMYDOPHILA PNEUMONIAE-RVPPCR: NOT DETECTED
CORONAVIRUS 229E-RVPPCR: NOT DETECTED
Coronavirus HKU1: NOT DETECTED
Coronavirus NL63: NOT DETECTED
Coronavirus OC43: DETECTED — AB
Influenza A H1 2009: DETECTED — AB
Influenza B: NOT DETECTED
METAPNEUMOVIRUS-RVPPCR: NOT DETECTED
Mycoplasma pneumoniae: NOT DETECTED
PARAINFLUENZA VIRUS 2-RVPPCR: NOT DETECTED
Parainfluenza Virus 1: NOT DETECTED
Parainfluenza Virus 3: NOT DETECTED
Parainfluenza Virus 4: NOT DETECTED
RESPIRATORY SYNCYTIAL VIRUS-RVPPCR: NOT DETECTED
Rhinovirus / Enterovirus: NOT DETECTED

## 2017-07-28 LAB — URINALYSIS, ROUTINE W REFLEX MICROSCOPIC
Bilirubin Urine: NEGATIVE
GLUCOSE, UA: NEGATIVE mg/dL
Hgb urine dipstick: NEGATIVE
Ketones, ur: NEGATIVE mg/dL
Leukocytes, UA: NEGATIVE
Nitrite: NEGATIVE
Protein, ur: NEGATIVE mg/dL
SPECIFIC GRAVITY, URINE: 1.012 (ref 1.005–1.030)
pH: 5 (ref 5.0–8.0)

## 2017-07-28 LAB — COMPREHENSIVE METABOLIC PANEL
ALBUMIN: 3.3 g/dL — AB (ref 3.5–5.0)
ALT: 13 U/L — ABNORMAL LOW (ref 17–63)
AST: 14 U/L — AB (ref 15–41)
Alkaline Phosphatase: 63 U/L (ref 38–126)
Anion gap: 11 (ref 5–15)
BUN: 72 mg/dL — AB (ref 6–20)
CHLORIDE: 100 mmol/L — AB (ref 101–111)
CO2: 19 mmol/L — AB (ref 22–32)
CREATININE: 3.99 mg/dL — AB (ref 0.61–1.24)
Calcium: 8.7 mg/dL — ABNORMAL LOW (ref 8.9–10.3)
GFR calc Af Amer: 19 mL/min — ABNORMAL LOW (ref >60)
GFR calc non Af Amer: 16 mL/min — ABNORMAL LOW (ref >60)
GLUCOSE: 201 mg/dL — AB (ref 65–99)
POTASSIUM: 5.1 mmol/L (ref 3.5–5.1)
SODIUM: 130 mmol/L — AB (ref 135–145)
Total Bilirubin: 0.5 mg/dL (ref 0.3–1.2)
Total Protein: 7.3 g/dL (ref 6.5–8.1)

## 2017-07-28 LAB — CBC
HEMATOCRIT: 35.2 % — AB (ref 39.0–52.0)
Hemoglobin: 11.7 g/dL — ABNORMAL LOW (ref 13.0–17.0)
MCH: 29 pg (ref 26.0–34.0)
MCHC: 33.2 g/dL (ref 30.0–36.0)
MCV: 87.3 fL (ref 78.0–100.0)
PLATELETS: 295 10*3/uL (ref 150–400)
RBC: 4.03 MIL/uL — ABNORMAL LOW (ref 4.22–5.81)
RDW: 13.5 % (ref 11.5–15.5)
WBC: 5.4 10*3/uL (ref 4.0–10.5)

## 2017-07-28 LAB — CBG MONITORING, ED: GLUCOSE-CAPILLARY: 153 mg/dL — AB (ref 65–99)

## 2017-07-28 LAB — GLUCOSE, CAPILLARY
GLUCOSE-CAPILLARY: 201 mg/dL — AB (ref 65–99)
GLUCOSE-CAPILLARY: 115 mg/dL — AB (ref 65–99)
GLUCOSE-CAPILLARY: 139 mg/dL — AB (ref 65–99)
Glucose-Capillary: 165 mg/dL — ABNORMAL HIGH (ref 65–99)

## 2017-07-28 LAB — MRSA PCR SCREENING: MRSA BY PCR: NEGATIVE

## 2017-07-28 MED ORDER — IPRATROPIUM-ALBUTEROL 0.5-2.5 (3) MG/3ML IN SOLN
3.0000 mL | Freq: Four times a day (QID) | RESPIRATORY_TRACT | Status: DC
  Administered 2017-07-28 – 2017-07-29 (×4): 3 mL via RESPIRATORY_TRACT
  Filled 2017-07-28 (×4): qty 3

## 2017-07-28 MED ORDER — BENZONATATE 100 MG PO CAPS
200.0000 mg | ORAL_CAPSULE | Freq: Three times a day (TID) | ORAL | Status: DC | PRN
  Administered 2017-07-29: 200 mg via ORAL
  Filled 2017-07-28: qty 2

## 2017-07-28 MED ORDER — SODIUM CHLORIDE 0.9 % IV SOLN
INTRAVENOUS | Status: DC
  Administered 2017-07-28 – 2017-07-29 (×3): via INTRAVENOUS

## 2017-07-28 NOTE — ED Notes (Signed)
Attempted to call report x 1  

## 2017-07-28 NOTE — Plan of Care (Signed)
Dicussed with patient plan of care and admission routine with some teach back displayed

## 2017-07-28 NOTE — ED Notes (Signed)
Pt walked to bathroom w/o issues.  Pt also placed on a hospital bed for comfort.

## 2017-07-28 NOTE — Progress Notes (Signed)
PROGRESS NOTE    [name]  VQQ:[id number] DOB: [birth year] DOA: 07/27/2017 PCP: Bartholome Bill, MD   Brief Narrative:   52 year old male with history of CKD 2, diabetes mellitus type 2 insulin-dependent, essential hypertension, anxiety, obstructive sleep apnea, recent right-sided empyema status post thoracotomy/VATS in February 2019 came to the hospital with complaints of URI type of symptoms and myalgia.  Patient also intermittently complained of feeling hot but did not check temperature.  Admitted of productive coughing.  Upon admission he was noted to be positive for influenza and coronavirus.  Clinically he appeared dehydrated with initial potassium of 5.7 and creatinine of 4.3 L up from his baseline of 2.3. 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  With IV fluids this is improved to 3.9 - We will continue aggressive IV fluids today in the hospital.  Avoid nephrotoxic drugs -We will need to follow-up outpatient with nephrologist and adequately continue to hydrate himself  Upper respiratory tract infection likely viral Influenza A positive and coronavirus -Provide supportive care, maintain droplet precautions -She is out of window for Tamiflu as his symptoms have been present for greater than 7 days -We will order nebulizer treatments.  Getting IV fluids and supportive care.  Insulin-dependent diabetes mellitus type 2 - Home Lantus of 20 units has been decreased to 6 units to adjust was poor appetite and renal dysfunction -We will slowly increase it as necessary -Continue insulin sliding scale  Right flank pain after his VATS procedure -Concerning for neuropathic versus musculoskeletal pain.  Continue Tylenol as  necessary  History of depression and anxiety -Continue Zoloft  DVT prophylaxis: Lovenox Code Status: Full code Family Communication: None Disposition Plan: Maintain another day of inpatient stay due to elevated renal function.  Still needs IV fluids.  Consultants:   None  Procedures:   None  Antimicrobials:   None   Subjective: Patient is any new complaints, remains afebrile overnight.  Review of Systems Otherwise negative except as per HPI, including: HEENT/EYES = negative for pain, redness, loss of vision, double vision, blurred vision, loss of hearing, sore throat, hoarseness, dysphagia Cardiovascular= negative for chest pain, palpitation, murmurs, lower extremity swelling Respiratory/lungs= negative for shortness of breath, cough, hemoptysis, wheezing, mucus production Gastrointestinal= negative for nausea, vomiting,, abdominal pain, melena, hematemesis Genitourinary= negative for Dysuria, Hematuria, Change in Urinary Frequency MSK = Negative for  Back Pain, Joint swelling  Neurology= Negative for headache, seizures, numbness, tingling  Psychiatry= Negative for anxiety, depression, suicidal and homocidal ideation Allergy/Immunology= Medication/Food allergy as listed  Skin= Negative for Rash, lesions, ulcers, itching   Objective: Vitals:   07/28/17 0600 07/28/17 0646 07/28/17 0820 07/28/17 1225  BP: 122/90  (!) 136/102 (!) 132/97  Pulse: 66  71 63  Resp: 15  18 16   Temp: 97.6 F (36.4 C)  97.7 F (36.5 C) 97.7 F (36.5 C)  TempSrc: Oral  Oral Oral  SpO2: 94%  99% 97%  Weight:  105.5 kg (232 lb 9.4 oz)    Height: 5\' 7"  (1.702 m)       Intake/Output Summary (Last 24 hours) at 07/28/2017 1338 Last data filed at 07/28/2017 1100 Gross per 24 hour  Intake 2495 ml  Output 400 ml  Net  2095 ml   Filed Weights   07/27/17 1700 07/28/17 0646  Weight: 104.3 kg (230 lb) 105.5 kg (232 lb 9.4 oz)    Examination: Constitutional: NAD, calm, comfortable Eyes:  PERRL, lids and conjunctivae normal ENMT: Mucous membranes are moist. Posterior pharynx clear of any exudate or lesions.Normal dentition.  Neck: normal, supple, no masses, no thyromegaly Respiratory: Slightly diffuse coarse breath sounds Cardiovascular: Regular rate and rhythm, no murmurs / rubs / gallops. No extremity edema. 2+ pedal pulses. No carotid bruits.  Abdomen: no tenderness, no masses palpated. No hepatosplenomegaly. Bowel sounds positive.  Musculoskeletal: no clubbing / cyanosis. No joint deformity upper and lower extremities. Good ROM, no contractures. Normal muscle tone.  Skin: no rashes, lesions, ulcers. No induration Neurologic: CN 2-12 grossly intact. Sensation intact, DTR normal. Strength 5/5 in all 4.  Psychiatric: Normal judgment and insight. Alert and oriented x 3. Normal mood.      Data Reviewed:   CBC: Recent Labs  Lab 07/27/17 1536 07/27/17 1721 07/28/17 0328  WBC  --  5.3 5.4  NEUTROABS  --  2.8  --   HGB 14.3 13.2 11.7*  HCT 42.0 38.8* 35.2*  MCV  --  86.8 87.3  PLT  --  349 093   Basic Metabolic Panel: Recent Labs  Lab 07/27/17 1536 07/27/17 1721 07/27/17 2054 07/28/17 0328  NA 131* 129*  --  130*  K 5.4* 5.7*  --  5.1  CL 99* 98*  --  100*  CO2  --  20*  --  19*  GLUCOSE 167* 205*  --  201*  BUN 72* 75*  --  72*  CREATININE 4.30* 4.29*  --  3.99*  CALCIUM  --  9.5  --  8.7*  MG  --   --  2.8*  --   PHOS  --   --  5.3*  --    GFR: Estimated Creatinine Clearance: 25.7 mL/min (A) (by C-G formula based on SCr of 3.99 mg/dL (H)). Liver Function Tests: Recent Labs  Lab 07/27/17 1721 07/28/17 0328  AST 14* 14*  ALT 14* 13*  ALKPHOS 72 63  BILITOT 0.4 0.5  PROT 8.5* 7.3  ALBUMIN 3.7 3.3*   No results for input(s): LIPASE, AMYLASE in the last 168 hours. No results for input(s): AMMONIA in the last 168 hours. Coagulation Profile: No results for input(s): INR, PROTIME in the last 168 hours. Cardiac Enzymes: No results for input(s):  CKTOTAL, CKMB, CKMBINDEX, TROPONINI in the last 168 hours. BNP (last 3 results) No results for input(s): PROBNP in the last 8760 hours. HbA1C: No results for input(s): HGBA1C in the last 72 hours. CBG: Recent Labs  Lab 07/27/17 2231 07/28/17 0107 07/28/17 0823 07/28/17 1224  GLUCAP 163* 153* 201* 115*   Lipid Profile: No results for input(s): CHOL, HDL, LDLCALC, TRIG, CHOLHDL, LDLDIRECT in the last 72 hours. Thyroid Function Tests: No results for input(s): TSH, T4TOTAL, FREET4, T3FREE, THYROIDAB in the last 72 hours. Anemia Panel: No results for input(s): VITAMINB12, FOLATE, FERRITIN, TIBC, IRON, RETICCTPCT in the last 72 hours. Sepsis Labs: Recent Labs  Lab 07/27/17 2104 07/27/17 2242  LATICACIDVEN 1.08 1.08    Recent Results (from the past 240 hour(s))  Respiratory Panel by PCR     Status: Abnormal   Collection Time: 07/27/17 10:28 PM  Result Value Ref Range Status   Adenovirus NOT DETECTED NOT DETECTED Final   Coronavirus 229E NOT DETECTED NOT DETECTED Final   Coronavirus HKU1 NOT DETECTED NOT DETECTED Final  Coronavirus NL63 NOT DETECTED NOT DETECTED Final   Coronavirus OC43 DETECTED (A) NOT DETECTED Final   Metapneumovirus NOT DETECTED NOT DETECTED Final   Rhinovirus / Enterovirus NOT DETECTED NOT DETECTED Final   Influenza A H1 2009 DETECTED (A) NOT DETECTED Final   Influenza B NOT DETECTED NOT DETECTED Final   Parainfluenza Virus 1 NOT DETECTED NOT DETECTED Final   Parainfluenza Virus 2 NOT DETECTED NOT DETECTED Final   Parainfluenza Virus 3 NOT DETECTED NOT DETECTED Final   Parainfluenza Virus 4 NOT DETECTED NOT DETECTED Final   Respiratory Syncytial Virus NOT DETECTED NOT DETECTED Final   Bordetella pertussis NOT DETECTED NOT DETECTED Final   Chlamydophila pneumoniae NOT DETECTED NOT DETECTED Final   Mycoplasma pneumoniae NOT DETECTED NOT DETECTED Final    Comment: Performed at [facility], [place] [address], [place], [place] [address]  MRSA PCR  Screening     Status: None   Collection Time: 07/28/17  6:16 AM  Result Value Ref Range Status   MRSA by PCR NEGATIVE NEGATIVE Final    Comment:        The GeneXpert MRSA Assay (FDA approved for NASAL specimens only), is one component of a comprehensive MRSA colonization surveillance program. It is not intended to diagnose MRSA infection nor to guide or monitor treatment for MRSA infections. Performed at [facility], [place] [address] [place], [place] [address]          Radiology Studies: Dg Chest 2 View  Result Date: 07/27/2017 CLINICAL DATA:  C/o head and chest congestion, cough, rhinitis, and weakness onset one week. Also states had infection in right lung one month and had surgery to clean out infection, now c/o right side pain at surgical site. Non-smoker. HX of htn, pneumonia, diabetes. EXAM: CHEST - 2 VIEW COMPARISON:  06/13/2016 FINDINGS: The heart size and mediastinal contours are within normal limits. Both lungs are clear. Degenerative changes are seen in thoracic spine. IMPRESSION: No active cardiopulmonary disease. Electronically Signed   By: Nolon Nations M.D.   On: 07/27/2017 15:10        Scheduled Meds: . enoxaparin (LOVENOX) injection  30 mg Subcutaneous Q24H  . insulin aspart  0-15 Units Subcutaneous TID WC  . insulin glargine  6 Units Subcutaneous QHS  . sertraline  100 mg Oral Daily   Continuous Infusions:   LOS: 0 days    Time spent: 32 mins    Ankit Arsenio Loader, MD [facility] Pager [phone]   If 7PM-7AM, please contact night-coverage [email or web address] Password [facility] 07/28/2017, 1:38 PM

## 2017-07-29 LAB — BASIC METABOLIC PANEL
Anion gap: 8 (ref 5–15)
BUN: 52 mg/dL — ABNORMAL HIGH (ref 6–20)
CALCIUM: 9 mg/dL (ref 8.9–10.3)
CHLORIDE: 105 mmol/L (ref 101–111)
CO2: 19 mmol/L — ABNORMAL LOW (ref 22–32)
CREATININE: 3.12 mg/dL — AB (ref 0.61–1.24)
GFR calc Af Amer: 25 mL/min — ABNORMAL LOW (ref >60)
GFR calc non Af Amer: 22 mL/min — ABNORMAL LOW (ref >60)
Glucose, Bld: 169 mg/dL — ABNORMAL HIGH (ref 65–99)
Potassium: 5.3 mmol/L — ABNORMAL HIGH (ref 3.5–5.1)
Sodium: 132 mmol/L — ABNORMAL LOW (ref 135–145)

## 2017-07-29 LAB — GLUCOSE, CAPILLARY: GLUCOSE-CAPILLARY: 160 mg/dL — AB (ref 65–99)

## 2017-07-29 LAB — MAGNESIUM: MAGNESIUM: 2.3 mg/dL (ref 1.7–2.4)

## 2017-07-29 NOTE — Discharge Summary (Signed)
Physician Discharge Summary  [name] RCV:[id number] DOB: 07/01/1965 DOA: 07/27/2017  PCP: Bartholome Bill, MD  Admit date: 07/27/2017 Discharge date: 07/29/2017  Admitted From: Home home Disposition:    Recommendations for Outpatient Follow-up:  1. Follow up with PCP in 1 weeks 2. Please obtain BMP in one week your next doctors visit to ensure renal function stay stable 3. Continue oral hydration aggressively  Home Health: None Equipment/Devices: None Discharge Condition: Stable CODE STATUS: Full code Diet recommendation: Regular  Brief/Interim Summary: 52 year old male with history of CKD 2, diabetes mellitus type 2 insulin-dependent, essential hypertension, anxiety, obstructive sleep apnea, recent right-sided empyema status post thoracotomy/VATS in February 2019 came to the hospital with complaints of URI type of symptoms and myalgia.  Patient also intermittently complained of feeling hot but did not check temperature.  Admitted of productive coughing.  Upon admission he was noted to be positive for influenza and coronavirus.  Clinically he appeared dehydrated with initial potassium of 5.7 and creatinine of 4.3 L up from his baseline of 2.3. He was admitted to the hospital for further care. Upon admission he was started on aggressive IV fluid resuscitation.  Respiratory panel came back positive for influenza A and coronavirus but patient was outside of Tamiflu window therefore supportive care was indicated.  He was given nebulizer treatments in over 24 hours his symptoms greatly improved.  His creatinine trended down to 3.1 and  and patient is urinating well. At this time patient has reached maximum benefit from an hospital stay and continue to hydrate himself at home.  He has nebulizer treatments at home which she can use 2-3 times daily as necessary.  He will need to follow with outpatient primary care physician in 1 week and get a repeat outpatient lab work-BMP to ensure renal  function has stabilized.  No complaints this morning and patient wishes to go home.  HEENT/EYES = negative for pain, redness, loss of vision, double vision, blurred vision, loss of hearing, sore throat, hoarseness, dysphagia Cardiovascular= negative for chest pain, palpitation, murmurs, lower extremity swelling Respiratory/lungs= negative for shortness of breath, cough, hemoptysis, wheezing, mucus production Gastrointestinal= negative for nausea, vomiting,, abdominal pain, melena, hematemesis Genitourinary= negative for Dysuria, Hematuria, Change in Urinary Frequency MSK = Negative for arthralgia, myalgias, Back Pain, Joint swelling  Neurology= Negative for headache, seizures, numbness, tingling  Psychiatry= Negative for anxiety, depression, suicidal and homocidal ideation Allergy/Immunology= Medication/Food allergy as listed  Skin= Negative for Rash, lesions, ulcers, itching   Assessment/Plan 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  With IV fluids this is improved to 3.1 - Cont to orally hydrate aggressively and get a repeat lab within 1 week at the primary care office visit to ensure this has completely resolved  Upper respiratory tract infection likely viral, improved Influenza A positive and coronavirus -Provide supportive care, maintain droplet precautions -Patient is out of Tamiflu window.  Continue nebulizer and inhalers as needed at home.  Provide supportive care.  Insulin-dependent diabetes mellitus type 2 -Resume home regimen  Right flank pain after his VATS procedure -Concerning for neuropathic versus musculoskeletal pain.  Continue Tylenol as necessary  History of depression and anxiety -Continue Zoloft   Patient is a full code Discharge today.  Discharge Diagnoses:  Principal Problem:   Weakness Active Problems:   Right sided Flank pain   Myalgia   AKI (acute kidney  injury) [facility])    Discharge  Instructions  Discharge Instructions    Diet - low sodium heart healthy   Complete by:  As directed    Increase activity slowly   Complete by:  As directed      Allergies as of 07/29/2017   No Known Allergies     Medication List    TAKE these medications   acetaminophen 325 MG tablet Commonly known as:  TYLENOL Take 2 tablets (650 mg total) by mouth every 6 (six) hours as needed for mild pain, moderate pain or headache. What changed:  how much to take   albuterol 108 (90 Base) MCG/ACT inhaler Commonly known as:  PROVENTIL HFA;VENTOLIN HFA Inhale 1-2 puffs into the lungs every 6 (six) hours as needed for wheezing or shortness of breath.   amLODipine 10 MG tablet Commonly known as:  NORVASC Take 1 tablet (10 mg total) by mouth daily.   aspirin EC 81 MG tablet Take 81 mg by mouth daily.   cloNIDine 0.2 mg/24hr patch Commonly known as:  CATAPRES - Dosed in mg/24 hr Place 1 patch (0.2 mg total) onto the skin once a week.   furosemide 80 MG tablet Commonly known as:  LASIX Take 120 mg by mouth daily.   gabapentin 300 MG capsule Commonly known as:  NEURONTIN Take 300 mg by mouth 2 (two) times daily.   insulin aspart 100 UNIT/ML injection Commonly known as:  NOVOLOG Before each meal 3 times a day, 140-199 - 2 units, 200-250 - 6 units, 251-299 - 8 units,  300-349 - 10 units,  350 or above 12 units. Dispense syringes and needles as needed, Ok to switch to PEN if approved. What changed:    how much to take  how to take this  when to take this  additional instructions   insulin glargine 100 UNIT/ML injection Commonly known as:  LANTUS Inject 0.2 mLs (20 Units total) into the skin daily. What changed:  how much to take   labetalol 200 MG tablet Commonly known as:  NORMODYNE Take 400 mg by mouth 2 (two) times daily.   potassium chloride SA 20 MEQ tablet Commonly known as:  K-DUR,KLOR-CON Take 20 mEq by mouth every other day.    sertraline 100 MG tablet Commonly known as:  ZOLOFT Take 100 mg by mouth daily.   spironolactone 50 MG tablet Commonly known as:  ALDACTONE Take 1 tablet (50 mg total) by mouth 2 (two) times daily.   tiZANidine 4 MG tablet Commonly known as:  ZANAFLEX Take 4 mg by mouth every 8 (eight) hours as needed for muscle spasms.      Follow-up Information    Bartholome Bill, MD. Schedule an appointment as soon as possible for a visit in 1 week(s).   Specialty:  Family Medicine Contact information: [place] [address] [phone]          No Known Allergies  You were cared for by a hospitalist during your hospital stay. If you have any questions about your discharge medications or the care you received while you were in the hospital after you are discharged, you can call the unit and asked to speak with the hospitalist on call if the hospitalist that took care of you is not available. Once you are discharged, your primary care physician will handle any further medical issues. Please note that no refills for any discharge medications will be authorized once you are discharged, as it is imperative that you return to your  primary care physician (or establish a relationship with a primary care physician if you do not have one) for your aftercare needs so that they can reassess your need for medications and monitor your lab values.  Consultations:  None   Procedures/Studies: Dg Chest 2 View  Result Date: 07/27/2017 CLINICAL DATA:  C/o head and chest congestion, cough, rhinitis, and weakness onset one week. Also states had infection in right lung one month and had surgery to clean out infection, now c/o right side pain at surgical site. Non-smoker. HX of htn, pneumonia, diabetes. EXAM: CHEST - 2 VIEW COMPARISON:  06/13/2016 FINDINGS: The heart size and mediastinal contours are within normal limits. Both lungs are clear. Degenerative changes are seen in  thoracic spine. IMPRESSION: No active cardiopulmonary disease. Electronically Signed   By: Nolon Nations M.D.   On: 07/27/2017 15:10      Subjective:   Discharge Exam: Vitals:   07/29/17 0808 07/29/17 0841  BP:  (!) 157/111  Pulse:  74  Resp:  18  Temp:  97.6 F (36.4 C)  SpO2: 98% 100%   Vitals:   07/29/17 0117 07/29/17 0158 07/29/17 0808 07/29/17 0841  BP: 131/84   (!) 157/111  Pulse: 74   74  Resp:    18  Temp: 97.8 F (36.6 C)   97.6 F (36.4 C)  TempSrc: Oral   Oral  SpO2: 100% 100% 98% 100%  Weight:      Height:        General: Pt is alert, awake, not in acute distress Cardiovascular: RRR, S1/S2 +, no rubs, no gallops Respiratory: CTA bilaterally, no wheezing, no rhonchi Abdominal: Soft, NT, ND, bowel sounds + Extremities: no edema, no cyanosis    The results of significant diagnostics from this hospitalization (including imaging, microbiology, ancillary and laboratory) are listed below for reference.     Microbiology: Recent Results (from the past 240 hour(s))  Blood culture (routine x 2)     Status: None (Preliminary result)   Collection Time: 07/27/17  8:54 PM  Result Value Ref Range Status   Specimen Description BLOOD RIGHT ANTECUBITAL  Final   Special Requests   Final    BOTTLES DRAWN AEROBIC AND ANAEROBIC Blood Culture adequate volume   Culture   Final    NO GROWTH < 24 HOURS Performed at [facility], [address], [place], [place] [address]    Report Status PENDING  Incomplete  Blood culture (routine x 2)     Status: None (Preliminary result)   Collection Time: 07/27/17  9:08 PM  Result Value Ref Range Status   Specimen Description BLOOD LEFT ANTECUBITAL  Final   Special Requests   Final    BOTTLES DRAWN AEROBIC AND ANAEROBIC Blood Culture adequate volume   Culture   Final    NO GROWTH < 24 HOURS Performed at [facility], [place] [address] [place], [place] [address]    Report Status PENDING  Incomplete  Respiratory Panel  by PCR     Status: Abnormal   Collection Time: 07/27/17 10:28 PM  Result Value Ref Range Status   Adenovirus NOT DETECTED NOT DETECTED Final   Coronavirus 229E NOT DETECTED NOT DETECTED Final   Coronavirus HKU1 NOT DETECTED NOT DETECTED Final   Coronavirus NL63 NOT DETECTED NOT DETECTED Final   Coronavirus OC43 DETECTED (A) NOT DETECTED Final   Metapneumovirus NOT DETECTED NOT DETECTED Final   Rhinovirus / Enterovirus NOT DETECTED NOT DETECTED Final   Influenza  A H1 2009 DETECTED (A) NOT DETECTED Final   Influenza B NOT DETECTED NOT DETECTED Final   Parainfluenza Virus 1 NOT DETECTED NOT DETECTED Final   Parainfluenza Virus 2 NOT DETECTED NOT DETECTED Final   Parainfluenza Virus 3 NOT DETECTED NOT DETECTED Final   Parainfluenza Virus 4 NOT DETECTED NOT DETECTED Final   Respiratory Syncytial Virus NOT DETECTED NOT DETECTED Final   Bordetella pertussis NOT DETECTED NOT DETECTED Final   Chlamydophila pneumoniae NOT DETECTED NOT DETECTED Final   Mycoplasma pneumoniae NOT DETECTED NOT DETECTED Final    Comment: Performed at [facility], [place] [address] [place], [place] [address]  MRSA PCR Screening     Status: None   Collection Time: 07/28/17  6:16 AM  Result Value Ref Range Status   MRSA by PCR NEGATIVE NEGATIVE Final    Comment:        The GeneXpert MRSA Assay (FDA approved for NASAL specimens only), is one component of a comprehensive MRSA colonization surveillance program. It is not intended to diagnose MRSA infection nor to guide or monitor treatment for MRSA infections. Performed at [facility], [place] [address], [place], [place] [address]      Labs: BNP (last 3 results) Recent Labs    05/16/17 1550  BNP 25.3   Basic Metabolic Panel: Recent Labs  Lab 07/27/17 1536 07/27/17 1721 07/27/17 2054 07/28/17 0328 07/29/17 0237  NA 131* 129*  --  130* 132*  K 5.4* 5.7*  --  5.1 5.3*  CL 99* 98*  --  100* 105  CO2  --  20*  --  19* 19*  GLUCOSE 167* 205*   --  201* 169*  BUN 72* 75*  --  72* 52*  CREATININE 4.30* 4.29*  --  3.99* 3.12*  CALCIUM  --  9.5  --  8.7* 9.0  MG  --   --  2.8*  --  2.3  PHOS  --   --  5.3*  --   --    Liver Function Tests: Recent Labs  Lab 07/27/17 1721 07/28/17 0328  AST 14* 14*  ALT 14* 13*  ALKPHOS 72 63  BILITOT 0.4 0.5  PROT 8.5* 7.3  ALBUMIN 3.7 3.3*   No results for input(s): LIPASE, AMYLASE in the last 168 hours. No results for input(s): AMMONIA in the last 168 hours. CBC: Recent Labs  Lab 07/27/17 1536 07/27/17 1721 07/28/17 0328  WBC  --  5.3 5.4  NEUTROABS  --  2.8  --   HGB 14.3 13.2 11.7*  HCT 42.0 38.8* 35.2*  MCV  --  86.8 87.3  PLT  --  349 295   Cardiac Enzymes: No results for input(s): CKTOTAL, CKMB, CKMBINDEX, TROPONINI in the last 168 hours. BNP: Invalid input(s): POCBNP CBG: Recent Labs  Lab 07/28/17 0823 07/28/17 1224 07/28/17 1637 07/28/17 2053 07/29/17 0800  GLUCAP 201* 115* 165* 139* 160*   D-Dimer No results for input(s): DDIMER in the last 72 hours. Hgb A1c No results for input(s): HGBA1C in the last 72 hours. Lipid Profile No results for input(s): CHOL, HDL, LDLCALC, TRIG, CHOLHDL, LDLDIRECT in the last 72 hours. Thyroid function studies No results for input(s): TSH, T4TOTAL, T3FREE, THYROIDAB in the last 72 hours.  Invalid input(s): FREET3 Anemia work up No results for input(s): VITAMINB12, FOLATE, FERRITIN, TIBC, IRON, RETICCTPCT in the last 72 hours. Urinalysis    Component Value Date/Time   COLORURINE YELLOW 07/28/2017 [place] 07/28/2017  0325   LABSPEC 1.012 07/28/2017 0325   PHURINE 5.0 07/28/2017 0325   GLUCOSEU NEGATIVE 07/28/2017 0325   HGBUR NEGATIVE 07/28/2017 0325   BILIRUBINUR NEGATIVE 07/28/2017 0325   KETONESUR NEGATIVE 07/28/2017 0325   PROTEINUR NEGATIVE 07/28/2017 0325   UROBILINOGEN 0.2 03/26/2014 1626   NITRITE NEGATIVE 07/28/2017 0325   LEUKOCYTESUR NEGATIVE 07/28/2017 0325   Sepsis Labs Invalid  input(s): PROCALCITONIN,  WBC,  LACTICIDVEN Microbiology Recent Results (from the past 240 hour(s))  Blood culture (routine x 2)     Status: None (Preliminary result)   Collection Time: 07/27/17  8:54 PM  Result Value Ref Range Status   Specimen Description BLOOD RIGHT ANTECUBITAL  Final   Special Requests   Final    BOTTLES DRAWN AEROBIC AND ANAEROBIC Blood Culture adequate volume   Culture   Final    NO GROWTH < 24 HOURS Performed at [facility], [place] [address], [place], [place] [address]    Report Status PENDING  Incomplete  Blood culture (routine x 2)     Status: None (Preliminary result)   Collection Time: 07/27/17  9:08 PM  Result Value Ref Range Status   Specimen Description BLOOD LEFT ANTECUBITAL  Final   Special Requests   Final    BOTTLES DRAWN AEROBIC AND ANAEROBIC Blood Culture adequate volume   Culture   Final    NO GROWTH < 24 HOURS Performed at [facility], [place] [address] [place], [place] [address]    Report Status PENDING  Incomplete  Respiratory Panel by PCR     Status: Abnormal   Collection Time: 07/27/17 10:28 PM  Result Value Ref Range Status   Adenovirus NOT DETECTED NOT DETECTED Final   Coronavirus 229E NOT DETECTED NOT DETECTED Final   Coronavirus HKU1 NOT DETECTED NOT DETECTED Final   Coronavirus NL63 NOT DETECTED NOT DETECTED Final   Coronavirus OC43 DETECTED (A) NOT DETECTED Final   Metapneumovirus NOT DETECTED NOT DETECTED Final   Rhinovirus / Enterovirus NOT DETECTED NOT DETECTED Final   Influenza A H1 2009 DETECTED (A) NOT DETECTED Final   Influenza B NOT DETECTED NOT DETECTED Final   Parainfluenza Virus 1 NOT DETECTED NOT DETECTED Final   Parainfluenza Virus 2 NOT DETECTED NOT DETECTED Final   Parainfluenza Virus 3 NOT DETECTED NOT DETECTED Final   Parainfluenza Virus 4 NOT DETECTED NOT DETECTED Final   Respiratory Syncytial Virus NOT DETECTED NOT DETECTED Final   Bordetella pertussis NOT DETECTED NOT DETECTED Final    Chlamydophila pneumoniae NOT DETECTED NOT DETECTED Final   Mycoplasma pneumoniae NOT DETECTED NOT DETECTED Final    Comment: Performed at [facility], [place] [address], [place], [place] [address]  MRSA PCR Screening     Status: None   Collection Time: 07/28/17  6:16 AM  Result Value Ref Range Status   MRSA by PCR NEGATIVE NEGATIVE Final    Comment:        The GeneXpert MRSA Assay (FDA approved for NASAL specimens only), is one component of a comprehensive MRSA colonization surveillance program. It is not intended to diagnose MRSA infection nor to guide or monitor treatment for MRSA infections. Performed at [facility], [place] [address] [place], [place] [address]      Time coordinating discharge: 32 mins  SIGNED:   Damita Lack, MD  [facility] 07/29/2017, 10:49 AM Pager   If 7PM-7AM, please contact night-coverage [email or web address] Password [email or web address]

## 2017-08-01 LAB — CULTURE, BLOOD (ROUTINE X 2)
CULTURE: NO GROWTH
Culture: NO GROWTH
Special Requests: ADEQUATE
Special Requests: ADEQUATE

## 2017-08-23 ENCOUNTER — Other Ambulatory Visit: Payer: Self-pay | Admitting: Family Medicine

## 2017-09-09 ENCOUNTER — Ambulatory Visit: Payer: Medicare Other | Attending: Family Medicine

## 2017-09-09 DIAGNOSIS — M6281 Muscle weakness (generalized): Secondary | ICD-10-CM | POA: Insufficient documentation

## 2017-09-09 DIAGNOSIS — R262 Difficulty in walking, not elsewhere classified: Secondary | ICD-10-CM | POA: Insufficient documentation

## 2017-09-09 DIAGNOSIS — R296 Repeated falls: Secondary | ICD-10-CM | POA: Insufficient documentation

## 2017-09-17 ENCOUNTER — Ambulatory Visit: Payer: Medicare Other

## 2017-10-03 ENCOUNTER — Encounter: Payer: Self-pay | Admitting: Physical Therapy

## 2017-10-03 ENCOUNTER — Ambulatory Visit: Payer: Medicare Other | Admitting: Physical Therapy

## 2017-10-03 DIAGNOSIS — R262 Difficulty in walking, not elsewhere classified: Secondary | ICD-10-CM

## 2017-10-03 DIAGNOSIS — R296 Repeated falls: Secondary | ICD-10-CM | POA: Diagnosis present

## 2017-10-03 DIAGNOSIS — M6281 Muscle weakness (generalized): Secondary | ICD-10-CM | POA: Diagnosis present
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
## 2017-10-16 ENCOUNTER — Encounter: Payer: Self-pay | Admitting: Physical Therapy

## 2017-10-16 ENCOUNTER — Ambulatory Visit: Payer: Medicare Other | Attending: Family Medicine | Admitting: Physical Therapy

## 2017-10-16 DIAGNOSIS — R262 Difficulty in walking, not elsewhere classified: Secondary | ICD-10-CM | POA: Diagnosis not present

## 2017-10-16 DIAGNOSIS — R296 Repeated falls: Secondary | ICD-10-CM | POA: Diagnosis present

## 2017-10-16 DIAGNOSIS — M6281 Muscle weakness (generalized): Secondary | ICD-10-CM

## 2017-10-16 NOTE — Therapy (Signed)
[place], [place], [address] Phone: [phone]   Fax:  [phone]  Physical Therapy Treatment  Patient Details  Name: [name] MRN: [id number] Date of Birth: 10/10/1965 Referring Provider: Precious Haws, MD   Encounter Date: 10/16/2017  PT End of Session - 10/16/17 1052    Visit Number  2    Number of Visits  12    PT Start Time  1224    PT Stop Time  1055    PT Time Calculation (min)  40 min    Activity Tolerance  Patient limited by fatigue    Behavior During Therapy  [facility] for tasks assessed/performed       Past Medical History:  Diagnosis Date  . Acute kidney injury ([place]) 12/28/2014  . Adjustment disorder with mixed anxiety and depressed mood 12/28/2014  . CPAP (continuous positive airway pressure) dependence   . Diabetes mellitus without complication ([place])   . DM (diabetes mellitus) type II controlled with renal manifestation ([place]) 12/28/2014  . DM (diabetes mellitus) type II controlled, neurological manifestation ([place]) 12/28/2014  . Encephalopathy, hypertensive 12/28/2014  . Hypertension   . Hypertensive emergency without congestive heart failure 12/27/2014  . Possible Panic disorder 12/28/2014  . Renal disorder   . 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 Sleep apnea     Past Surgical History:  Procedure Laterality Date  . EMPYEMA DRAINAGE Right 05/17/2017   Procedure: EMPYEMA DRAINAGE AND DECORTICATION;  Surgeon: Grace Isaac, MD;  Location: [facility];  Service: Thoracic;  Laterality: Right;  . FLEXIBLE BRONCHOSCOPY  05/17/2017   Procedure: FLEXIBLE BRONCHOSCOPY;  Surgeon: Grace Isaac, MD;  Location: [place];  Service: Thoracic;;  . IR THORACENTESIS ASP PLEURAL SPACE W/IMG GUIDE  05/17/2017  . NO PAST SURGERIES    . THORACOTOMY/LOBECTOMY Right 05/17/2017   Procedure: RIGHT MINI THORACOTOMY;  Surgeon: Grace Isaac, MD;  Location: [facility];  Service: Thoracic;  Laterality: Right;  Marland Kitchen  VIDEO ASSISTED THORACOSCOPY (VATS)/EMPYEMA Right 05/17/2017   Procedure: RIGHT VIDEO ASSISTED THORACOSCOPY (VATS)/EMPYEMA;  Surgeon: Grace Isaac, MD;  Location: [place];  Service: Thoracic;  Laterality: Right;    There were no vitals filed for this visit.  Subjective Assessment - 10/16/17 1020    Subjective  Pt reports "feeling pretty good today" but still having loss of balance issues.     Currently in Pain?  No/denies                       [facility] Adult PT Treatment/Exercise - 10/16/17 0001      Exercises   Exercises  Knee/Hip      Knee/Hip Exercises: Aerobic   Nustep  L5 6 MIN      Knee/Hip Exercises: Machines for Strengthening   Cybex Knee Extension  20 lb 2 x 10    Cybex Knee Flexion  20 lb 2 x 10    Cybex Leg Press  20lb 2 x 10      Knee/Hip Exercises: Standing   Heel Raises  Both;2 sets;10 reps    Forward Step Up  Both;2 sets;5 reps;Step Height: 6"    Other Standing Knee Exercises  Overhead presses with red ball  1 x 10    Other Standing Knee Exercises  Yellow theraband rows 1 x 10      Knee/Hip Exercises: Seated   Sit to Sand  1 set;5 reps blue chair  PT Short Term Goals - 10/03/17 1135      PT SHORT TERM GOAL #1   Title  Pt will be independent in his HEP.     Time  3    Period  Weeks    Status  New    Target Date  10/24/17      PT SHORT TERM GOAL #2   Title  Pt will be able to improve his 5 time sit to stand </= 15 seconds with no UE support.     Time  3    Period  Weeks    Status  New    Target Date  10/24/17        PT Long Term Goals - 10/03/17 1136      PT LONG TERM GOAL #1   Title  Pt will improve his FOTO from 61 % limitation to  49 % limitation.     Time  6    Period  Weeks    Status  New    Target Date  11/14/17      PT LONG TERM GOAL #2   Title  Pt will be able to improve his BERG from 37/56 to >/= 47/56.     Time  6    Period  Weeks    Status  New    Target Date  11/14/17      PT LONG TERM  GOAL #3   Title  Pt will improve his strength in bilateral hips to 5/5 in order to improve gait and functional mobility.     Baseline  -4/5     Time  6    Period  Weeks    Status  New    Target Date  11/14/17            Plan - 10/16/17 1054    Clinical Impression Statement  Pt reports still having LOB episodes and difficulty with functional tasks. Pt limited by dizziness and fatigue with most activities. Pt tolerated exercises with ample rest in between sets.     Rehab Potential  Good    PT Frequency  2x / week    PT Duration  6 weeks    PT Treatment/Interventions  ADLs/Self Care Home Management;Gait training;Stair training;Functional mobility training;Therapeutic exercise;Therapeutic activities;Manual techniques;Patient/family education;Neuromuscular re-education;Passive range of motion;Taping    PT Next Visit Plan  aerobic exercises to improve endurance and LE strength, balance exercises and posturea exercises to improve thoracic expansion due to h/o r rib fx    PT Home Exercise Plan  rows, posture correctiopn       Patient will benefit from skilled therapeutic intervention in order to improve the following deficits and impairments:  Pain, Postural dysfunction, Decreased balance, Difficulty walking, Decreased strength, Decreased activity tolerance  Visit Diagnosis: Difficulty in walking, not elsewhere classified  Muscle weakness (generalized)  Repeated falls     Problem List Patient Active Problem List   Diagnosis Date Noted  . AKI (acute kidney injury) ([place]) 07/28/2017  . Right sided Flank pain 07/27/2017  . Weakness 07/27/2017  . Myalgia 07/27/2017  . Pneumonia of right lung due to infectious organism   . Pleural effusion   . Uncontrolled hypertension   . Post-operative pain   . Diabetes mellitus type 2 in nonobese ([facility])   . Acute diastolic congestive heart failure ([place])   . Encephalopathy   . Stage 4 chronic kidney disease ([place])   . SIRS (systemic  inflammatory response syndrome) (HCC)   . Chest  tube in place   . 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[place])   . Postoperative pain   . Pleural effusion on right 05/17/2017  . Empyema ([place]) 05/16/2017  . Encephalopathy, hypertensive 12/28/2014  . Acute kidney injury ([place]) 12/28/2014  . Type 2 diabetes mellitus with hyperglycemia, with long-term current use of insulin ([place]) 12/28/2014  . DM (diabetes mellitus) type II controlled, neurological manifestation ([place]) 12/28/2014  . Adjustment disorder with mixed anxiety and depressed mood 12/28/2014  . Possible Panic disorder 12/28/2014  . Hypertensive emergency without congestive heart failure 12/27/2014  . Hyponatremia 06/27/2013  . Essential hypertension, malignant 06/27/2013  . Hyperglycemia 06/26/2013  . DKA (diabetic ketoacidoses) ([place]) 06/26/2013  . Severe uncontrolled hypertension 03/31/2011  . Hypokalemia 03/28/2011  . Essential hypertension 03/28/2011  . CKD (chronic kidney disease) stage 3, GFR 30-59 ml/min (HCC) 03/28/2011   STEPHEN CLINE, SPTA [place] Jun, PTA 10/16/2017, 11:03 AM  [place], [place], [address] Phone: [phone]   Fax:  [phone]  Name: Fishel Saintjean MRN: [id number] Date of Birth: [birth year]

## 2017-10-18 ENCOUNTER — Ambulatory Visit: Payer: Medicare Other | Admitting: Physical Therapy

## 2017-10-18 ENCOUNTER — Encounter: Payer: Self-pay | Admitting: Physical Therapy

## 2017-10-18 DIAGNOSIS — R262 Difficulty in walking, not elsewhere classified: Secondary | ICD-10-CM

## 2017-10-18 DIAGNOSIS — R296 Repeated falls: Secondary | ICD-10-CM

## 2017-10-18 DIAGNOSIS — M6281 Muscle weakness (generalized): Secondary | ICD-10-CM

## 2017-10-18 NOTE — Therapy (Signed)
[place], [place], [address] Phone: [phone]   Fax:  [phone]  Physical Therapy Treatment  Patient Details  Name: [name] MRN: [id number] Date of Birth: Jul 08, 1965 Referring Provider: Precious Haws, MD   Encounter Date: 10/18/2017  PT End of Session - 10/18/17 1053    Visit Number  3    Number of Visits  12    Authorization Type  [place], KX modifier after visit 15    PT Start Time  1026    PT Stop Time  1057    PT Time Calculation (min)  31 min    Activity Tolerance  Patient limited by fatigue;Patient tolerated treatment well    Behavior During Therapy  [facility] for tasks assessed/performed       Past Medical History:  Diagnosis Date  . Acute kidney injury ([place]) 12/28/2014  . Adjustment disorder with mixed anxiety and depressed mood 12/28/2014  . CPAP (continuous positive airway pressure) dependence   . Diabetes mellitus without complication ([place])   . DM (diabetes mellitus) type II controlled with renal manifestation ([place]) 12/28/2014  . DM (diabetes mellitus) type II controlled, neurological manifestation ([place]) 12/28/2014  . Encephalopathy, hypertensive 12/28/2014  . Hypertension   . Hypertensive emergency without congestive heart failure 12/27/2014  . Possible Panic disorder 12/28/2014  . Renal disorder   . 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 Sleep apnea     Past Surgical History:  Procedure Laterality Date  . EMPYEMA DRAINAGE Right 05/17/2017   Procedure: EMPYEMA DRAINAGE AND DECORTICATION;  Surgeon: Grace Isaac, MD;  Location: [facility];  Service: Thoracic;  Laterality: Right;  . FLEXIBLE BRONCHOSCOPY  05/17/2017   Procedure: FLEXIBLE BRONCHOSCOPY;  Surgeon: Grace Isaac, MD;  Location: [facility];  Service: Thoracic;;  . IR THORACENTESIS ASP PLEURAL SPACE W/IMG GUIDE  05/17/2017  . NO PAST SURGERIES    . THORACOTOMY/LOBECTOMY Right 05/17/2017   Procedure: RIGHT MINI THORACOTOMY;   Surgeon: Grace Isaac, MD;  Location: [facility];  Service: Thoracic;  Laterality: Right;  Marland Kitchen VIDEO ASSISTED THORACOSCOPY (VATS)/EMPYEMA Right 05/17/2017   Procedure: RIGHT VIDEO ASSISTED THORACOSCOPY (VATS)/EMPYEMA;  Surgeon: Grace Isaac, MD;  Location: [facility];  Service: Thoracic;  Laterality: Right;    There were no vitals filed for this visit.  Subjective Assessment - 10/18/17 1029    Subjective  "Im feeling all right"    Currently in Pain?  Yes    Pain Score  6     Pain Location  -- Flank    Pain Orientation  Right                       [facility] Adult PT Treatment/Exercise - 10/18/17 0001      High Level Balance   High Level Balance Activities  Side stepping;Backward walking;Negotiating over obstacles    High Level Balance Comments  forward ands side stepping over foam rolls       Knee/Hip Exercises: Aerobic   Recumbent Bike  L1 x4 min     Nustep  L5 6 MIN      Knee/Hip Exercises: Seated   Sit to Sand  10 reps;2 sets;5 reps;without UE support               PT Short Term Goals - 10/03/17 1135      PT SHORT TERM GOAL #1   Title  Pt will be independent in his HEP.  Time  3    Period  Weeks    Status  New    Target Date  10/24/17      PT SHORT TERM GOAL #2   Title  Pt will be able to improve his 5 time sit to stand </= 15 seconds with no UE support.     Time  3    Period  Weeks    Status  New    Target Date  10/24/17        PT Long Term Goals - 10/03/17 1136      PT LONG TERM GOAL #1   Title  Pt will improve his FOTO from 61 % limitation to  49 % limitation.     Time  6    Period  Weeks    Status  New    Target Date  11/14/17      PT LONG TERM GOAL #2   Title  Pt will be able to improve his BERG from 37/56 to >/= 47/56.     Time  6    Period  Weeks    Status  New    Target Date  11/14/17      PT LONG TERM GOAL #3   Title  Pt will improve his strength in bilateral hips to 5/5 in order to improve gait and functional mobility.      Baseline  -4/5     Time  6    Period  Weeks    Status  New    Target Date  11/14/17            Plan - 10/18/17 1054    Clinical Impression Statement  Pt tolerated today's treatment well, he continues to fatigue quick with activity. Cues provided to prevent circumduction of LE when stepping over objects. No reports of increase pain, No LOB.    Rehab Potential  Good    PT Frequency  2x / week    PT Duration  6 weeks    PT Treatment/Interventions  ADLs/Self Care Home Management;Gait training;Stair training;Functional mobility training;Therapeutic exercise;Therapeutic activities;Manual techniques;Patient/family education;Neuromuscular re-education;Passive range of motion;Taping    PT Next Visit Plan  aerobic exercises to improve endurance and LE strength, balance exercises and posture exercises to improve thoracic expansion due to h/o r rib fx       Patient will benefit from skilled therapeutic intervention in order to improve the following deficits and impairments:  Pain, Postural dysfunction, Decreased balance, Difficulty walking, Decreased strength, Decreased activity tolerance  Visit Diagnosis: Muscle weakness (generalized)  Difficulty in walking, not elsewhere classified  Repeated falls     Problem List Patient Active Problem List   Diagnosis Date Noted  . AKI (acute kidney injury) ([place]) 07/28/2017  . Right sided Flank pain 07/27/2017  . Weakness 07/27/2017  . Myalgia 07/27/2017  . Pneumonia of right lung due to infectious organism   . Pleural effusion   . Uncontrolled hypertension   . Post-operative pain   . Diabetes mellitus type 2 in nonobese (HCC)   . Acute diastolic congestive heart failure ([place])   . Encephalopathy   . Stage 4 chronic kidney disease ([place])   . SIRS (systemic inflammatory response syndrome) (HCC)   . Chest tube in place   . 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[place])   . Postoperative pain   . Pleural effusion on right 05/17/2017  . Empyema ([place]) 05/16/2017  .  Encephalopathy, hypertensive 12/28/2014  . Acute kidney injury ([place]) 12/28/2014  . Type 2  diabetes mellitus with hyperglycemia, with long-term current use of insulin ([place]) 12/28/2014  . DM (diabetes mellitus) type II controlled, neurological manifestation ([place]) 12/28/2014  . Adjustment disorder with mixed anxiety and depressed mood 12/28/2014  . Possible Panic disorder 12/28/2014  . Hypertensive emergency without congestive heart failure 12/27/2014  . Hyponatremia 06/27/2013  . Essential hypertension, malignant 06/27/2013  . Hyperglycemia 06/26/2013  . DKA (diabetic ketoacidoses) ([place]) 06/26/2013  . Severe uncontrolled hypertension 03/31/2011  . Hypokalemia 03/28/2011  . Essential hypertension 03/28/2011  . CKD (chronic kidney disease) stage 3, GFR 30-59 ml/min (HCC) 03/28/2011    Scot Jun, PTA 10/18/2017, 10:56 AM  [place], [place], [address] Phone: [phone]   Fax:  [phone]  Name: [name] MRN: [id number] Date of Birth: [birth year]

## 2017-10-24 ENCOUNTER — Ambulatory Visit: Payer: Medicare Other | Admitting: Physical Therapy

## 2017-10-30 ENCOUNTER — Ambulatory Visit: Payer: Medicare Other | Admitting: Physical Therapy

## 2017-10-30 DIAGNOSIS — M6281 Muscle weakness (generalized): Secondary | ICD-10-CM

## 2017-10-30 DIAGNOSIS — R262 Difficulty in walking, not elsewhere classified: Secondary | ICD-10-CM

## 2017-10-30 DIAGNOSIS — R296 Repeated falls: Secondary | ICD-10-CM

## 2017-10-30 NOTE — Therapy (Signed)
[place], [place], [address] Phone: [phone]   Fax:  [phone]  Physical Therapy Treatment  Patient Details  Name: [name] MRN: [id number] Date of Birth: 15-Feb-1966 Referring Provider: Precious Haws, MD   Encounter Date: 10/30/2017  PT End of Session - 10/30/17 1057    Visit Number  4    Number of Visits  12    PT Start Time  6629    PT Stop Time  1057    PT Time Calculation (min)  39 min    Activity Tolerance  Patient limited by fatigue;Patient tolerated treatment well    Behavior During Therapy  [facility] for tasks assessed/performed       Past Medical History:  Diagnosis Date  . Acute kidney injury ([place]) 12/28/2014  . Adjustment disorder with mixed anxiety and depressed mood 12/28/2014  . CPAP (continuous positive airway pressure) dependence   . Diabetes mellitus without complication ([place])   . DM (diabetes mellitus) type II controlled with renal manifestation ([place]) 12/28/2014  . DM (diabetes mellitus) type II controlled, neurological manifestation ([place]) 12/28/2014  . Encephalopathy, hypertensive 12/28/2014  . Hypertension   . Hypertensive emergency without congestive heart failure 12/27/2014  . Possible Panic disorder 12/28/2014  . Renal disorder   . 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 Sleep apnea     Past Surgical History:  Procedure Laterality Date  . EMPYEMA DRAINAGE Right 05/17/2017   Procedure: EMPYEMA DRAINAGE AND DECORTICATION;  Surgeon: Grace Isaac, MD;  Location: [facility];  Service: Thoracic;  Laterality: Right;  . FLEXIBLE BRONCHOSCOPY  05/17/2017   Procedure: FLEXIBLE BRONCHOSCOPY;  Surgeon: Grace Isaac, MD;  Location: [facility];  Service: Thoracic;;  . IR THORACENTESIS ASP PLEURAL SPACE W/IMG GUIDE  05/17/2017  . NO PAST SURGERIES    . THORACOTOMY/LOBECTOMY Right 05/17/2017   Procedure: RIGHT MINI THORACOTOMY;  Surgeon: Grace Isaac, MD;  Location: [facility];  Service:  Thoracic;  Laterality: Right;  Marland Kitchen VIDEO ASSISTED THORACOSCOPY (VATS)/EMPYEMA Right 05/17/2017   Procedure: RIGHT VIDEO ASSISTED THORACOSCOPY (VATS)/EMPYEMA;  Surgeon: Grace Isaac, MD;  Location: [facility];  Service: Thoracic;  Laterality: Right;    There were no vitals filed for this visit.  Subjective Assessment - 10/30/17 1025    Subjective  Pt reports feelings alright but having pain in the side.     Currently in Pain?  Yes    Pain Score  6     Pain Location  Abdomen    Pain Orientation  Right                       [facility] Adult PT Treatment/Exercise - 10/30/17 0001      High Level Balance   High Level Balance Activities  Side stepping;Backward walking;Negotiating over obstacles    High Level Balance Comments  forward ands side stepping over foam rolls       Knee/Hip Exercises: Aerobic   Recumbent Bike  L1 x4 min     Nustep  L5 6 MIN      Knee/Hip Exercises: Standing   Heel Raises  Both;10 reps;1 set      Knee/Hip Exercises: Seated   Hamstring Curl  Both;1 set;10 reps yellow theraband    Sit to Sand  10 reps;2 sets;5 reps;without UE support               PT Short Term Goals - 10/03/17 1135  PT SHORT TERM GOAL #1   Title  Pt will be independent in his HEP.     Time  3    Period  Weeks    Status  New    Target Date  10/24/17      PT SHORT TERM GOAL #2   Title  Pt will be able to improve his 5 time sit to stand </= 15 seconds with no UE support.     Time  3    Period  Weeks    Status  New    Target Date  10/24/17        PT Long Term Goals - 10/03/17 1136      PT LONG TERM GOAL #1   Title  Pt will improve his FOTO from 61 % limitation to  49 % limitation.     Time  6    Period  Weeks    Status  New    Target Date  11/14/17      PT LONG TERM GOAL #2   Title  Pt will be able to improve his BERG from 37/56 to >/= 47/56.     Time  6    Period  Weeks    Status  New    Target Date  11/14/17      PT LONG TERM GOAL #3   Title  Pt  will improve his strength in bilateral hips to 5/5 in order to improve gait and functional mobility.     Baseline  -4/5     Time  6    Period  Weeks    Status  New    Target Date  11/14/17            Plan - 10/30/17 1058    Clinical Impression Statement  Pt fatigues quickly with exercise and needs regular rest breaks. Pt required verbal cues to prevent circumduction of LE during obstacle side stepping. Pt reported that the HEP band rows "seem to make things worse in his side."  Pt reports no LOB.    PT Frequency  2x / week    PT Duration  6 weeks    PT Treatment/Interventions  ADLs/Self Care Home Management;Gait training;Stair training;Functional mobility training;Therapeutic exercise;Therapeutic activities;Manual techniques;Patient/family education;Neuromuscular re-education;Passive range of motion;Taping       Patient will benefit from skilled therapeutic intervention in order to improve the following deficits and impairments:  Pain, Postural dysfunction, Decreased balance, Difficulty walking, Decreased strength, Decreased activity tolerance  Visit Diagnosis: Muscle weakness (generalized)  Difficulty in walking, not elsewhere classified  Repeated falls     Problem List Patient Active Problem List   Diagnosis Date Noted  . AKI (acute kidney injury) ([place]) 07/28/2017  . Right sided Flank pain 07/27/2017  . Weakness 07/27/2017  . Myalgia 07/27/2017  . Pneumonia of right lung due to infectious organism   . Pleural effusion   . Uncontrolled hypertension   . Post-operative pain   . Diabetes mellitus type 2 in nonobese (HCC)   . Acute diastolic congestive heart failure ([place])   . Encephalopathy   . Stage 4 chronic kidney disease ([place])   . SIRS (systemic inflammatory response syndrome) (HCC)   . Chest tube in place   . 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[place])   . Postoperative pain   . Pleural effusion on right 05/17/2017  . Empyema ([place]) 05/16/2017  . Encephalopathy, hypertensive 12/28/2014   . Acute kidney injury ([place]) 12/28/2014  . Type 2 diabetes mellitus with hyperglycemia, with  long-term current use of insulin ([place]) 12/28/2014  . DM (diabetes mellitus) type II controlled, neurological manifestation ([place]) 12/28/2014  . Adjustment disorder with mixed anxiety and depressed mood 12/28/2014  . Possible Panic disorder 12/28/2014  . Hypertensive emergency without congestive heart failure 12/27/2014  . Hyponatremia 06/27/2013  . Essential hypertension, malignant 06/27/2013  . Hyperglycemia 06/26/2013  . DKA (diabetic ketoacidoses) ([place]) 06/26/2013  . Severe uncontrolled hypertension 03/31/2011  . Hypokalemia 03/28/2011  . Essential hypertension 03/28/2011  . CKD (chronic kidney disease) stage 3, GFR 30-59 ml/min ([place]) 03/28/2011    Maryelizabeth Kaufmann, SPTA 10/30/2017, 11:04 AM  [place], [place], [address] Phone: [phone]   Fax:  [phone]  Name: [name] MRN: [id number] Date of Birth: 06/25/1965

## 2017-11-01 ENCOUNTER — Ambulatory Visit: Payer: Medicare Other | Admitting: Physical Therapy

## 2017-11-01 ENCOUNTER — Encounter: Payer: Self-pay | Admitting: Physical Therapy

## 2017-11-01 DIAGNOSIS — R296 Repeated falls: Secondary | ICD-10-CM

## 2017-11-01 DIAGNOSIS — R262 Difficulty in walking, not elsewhere classified: Secondary | ICD-10-CM | POA: Diagnosis not present

## 2017-11-01 DIAGNOSIS — M6281 Muscle weakness (generalized): Secondary | ICD-10-CM

## 2017-11-01 NOTE — Therapy (Signed)
[place], [place], [address] Phone: [phone]   Fax:  [phone]  Physical Therapy Treatment  Patient Details  Name: [name] MRN: [id number] Date of Birth: October 23, 1965 Referring Provider: Precious Haws, MD   Encounter Date: 11/01/2017  PT End of Session - 11/01/17 1056    Visit Number  5    Number of Visits  12    PT Start Time  1022    PT Stop Time  1100    PT Time Calculation (min)  38 min    Activity Tolerance  Patient limited by fatigue;Patient tolerated treatment well    Behavior During Therapy  [facility] for tasks assessed/performed;Flat affect       Past Medical History:  Diagnosis Date  . Acute kidney injury ([place]) 12/28/2014  . Adjustment disorder with mixed anxiety and depressed mood 12/28/2014  . CPAP (continuous positive airway pressure) dependence   . Diabetes mellitus without complication ([place])   . DM (diabetes mellitus) type II controlled with renal manifestation ([place]) 12/28/2014  . DM (diabetes mellitus) type II controlled, neurological manifestation ([place]) 12/28/2014  . Encephalopathy, hypertensive 12/28/2014  . Hypertension   . Hypertensive emergency without congestive heart failure 12/27/2014  . Possible Panic disorder 12/28/2014  . Renal disorder   . 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 Sleep apnea     Past Surgical History:  Procedure Laterality Date  . EMPYEMA DRAINAGE Right 05/17/2017   Procedure: EMPYEMA DRAINAGE AND DECORTICATION;  Surgeon: Grace Isaac, MD;  Location: [facility];  Service: Thoracic;  Laterality: Right;  . FLEXIBLE BRONCHOSCOPY  05/17/2017   Procedure: FLEXIBLE BRONCHOSCOPY;  Surgeon: Grace Isaac, MD;  Location: [place];  Service: Thoracic;;  . IR THORACENTESIS ASP PLEURAL SPACE W/IMG GUIDE  05/17/2017  . NO PAST SURGERIES    . THORACOTOMY/LOBECTOMY Right 05/17/2017   Procedure: RIGHT MINI THORACOTOMY;  Surgeon: Grace Isaac, MD;  Location: [facility];   Service: Thoracic;  Laterality: Right;  Marland Kitchen VIDEO ASSISTED THORACOSCOPY (VATS)/EMPYEMA Right 05/17/2017   Procedure: RIGHT VIDEO ASSISTED THORACOSCOPY (VATS)/EMPYEMA;  Surgeon: Grace Isaac, MD;  Location: [facility];  Service: Thoracic;  Laterality: Right;    There were no vitals filed for this visit.  Subjective Assessment - 11/01/17 1025    Subjective  "Just a little tired that's all"    Currently in Pain?  No/denies    Pain Score  0-No pain         [facility] PT Assessment - 11/01/17 0001      Berg Balance Test   Sit to Stand  Able to stand without using hands and stabilize independently    Standing Unsupported  Able to stand safely 2 minutes    Sitting with Back Unsupported but Feet Supported on Floor or Stool  Able to sit safely and securely 2 minutes    Stand to Sit  Sits safely with minimal use of hands    Transfers  Able to transfer safely, minor use of hands    Standing Unsupported with Eyes Closed  Able to stand 10 seconds with supervision    Standing Ubsupported with Feet Together  Able to place feet together independently and stand for 1 minute with supervision    From Standing, Reach Forward with Outstretched Arm  Can reach forward >12 cm safely (5")    From Standing Position, Pick up Object from Floor  Able to pick up shoe, needs supervision    From Standing  Position, Turn to Look Behind Over each Shoulder  Looks behind one side only/other side shows less weight shift    Turn 360 Degrees  Able to turn 360 degrees safely but slowly    Standing Unsupported, Alternately Place Feet on Step/Stool  Able to stand independently and safely and complete 8 steps in 20 seconds    Standing Unsupported, One Foot in Front  Able to plae foot ahead of the other independently and hold 30 seconds    Standing on One Leg  Able to lift leg independently and hold 5-10 seconds    Total Score  47                   [facility] Adult PT Treatment/Exercise - 11/01/17 0001      High Level  Balance   High Level Balance Comments  dise stepping over foam roll;       Knee/Hip Exercises: Aerobic   Recumbent Bike  L1 x4 min     Nustep  L5 6 MIN      Knee/Hip Exercises: Seated   Sit to Sand  1 set;10 reps;without UE support               PT Short Term Goals - 10/03/17 1135      PT SHORT TERM GOAL #1   Title  Pt will be independent in his HEP.     Time  3    Period  Weeks    Status  New    Target Date  10/24/17      PT SHORT TERM GOAL #2   Title  Pt will be able to improve his 5 time sit to stand </= 15 seconds with no UE support.     Time  3    Period  Weeks    Status  New    Target Date  10/24/17        PT Long Term Goals - 11/01/17 1049      PT LONG TERM GOAL #2   Title  Pt will be able to improve his BERG from 37/56 to >/= 47/56.     Status  Achieved            Plan - 11/01/17 1057    Clinical Impression Statement  Pt ~ 7 minutes late for today's session, flat effect With little feedback throughout treatment. Pt will often close his eyes when resting but reports that's he is fine. Pt progressed meeting his BERG balance goal. Constant cues not to allow LE to touch mat table with sit to stands.     Rehab Potential  Good    PT Frequency  2x / week    PT Duration  6 weeks    PT Treatment/Interventions  ADLs/Self Care Home Management;Gait training;Stair training;Functional mobility training;Therapeutic exercise;Therapeutic activities;Manual techniques;Patient/family education;Neuromuscular re-education;Passive range of motion;Taping    PT Next Visit Plan  aerobic exercises to improve endurance and LE strength, balance exercises and posture exercises to improve thoracic expansion due to h/o r rib fx       Patient will benefit from skilled therapeutic intervention in order to improve the following deficits and impairments:  Pain, Postural dysfunction, Decreased balance, Difficulty walking, Decreased strength, Decreased activity tolerance  Visit  Diagnosis: Difficulty in walking, not elsewhere classified  Muscle weakness (generalized)  Repeated falls     Problem List Patient Active Problem List   Diagnosis Date Noted  . AKI (acute kidney injury) ([place]) 07/28/2017  . Right sided  Flank pain 07/27/2017  . Weakness 07/27/2017  . Myalgia 07/27/2017  . Pneumonia of right lung due to infectious organism   . Pleural effusion   . Uncontrolled hypertension   . Post-operative pain   . Diabetes mellitus type 2 in nonobese (HCC)   . Acute diastolic congestive heart failure ([place])   . Encephalopathy   . Stage 4 chronic kidney disease ([place])   . SIRS (systemic inflammatory response syndrome) (HCC)   . Chest tube in place   . 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[place])   . Postoperative pain   . Pleural effusion on right 05/17/2017  . Empyema ([place]) 05/16/2017  . Encephalopathy, hypertensive 12/28/2014  . Acute kidney injury ([place]) 12/28/2014  . Type 2 diabetes mellitus with hyperglycemia, with long-term current use of insulin ([place]) 12/28/2014  . DM (diabetes mellitus) type II controlled, neurological manifestation ([place]) 12/28/2014  . Adjustment disorder with mixed anxiety and depressed mood 12/28/2014  . Possible Panic disorder 12/28/2014  . Hypertensive emergency without congestive heart failure 12/27/2014  . Hyponatremia 06/27/2013  . Essential hypertension, malignant 06/27/2013  . Hyperglycemia 06/26/2013  . DKA (diabetic ketoacidoses) ([place]) 06/26/2013  . Severe uncontrolled hypertension 03/31/2011  . Hypokalemia 03/28/2011  . Essential hypertension 03/28/2011  . CKD (chronic kidney disease) stage 3, GFR 30-59 ml/min (HCC) 03/28/2011    Scot Jun, PTA 11/01/2017, 11:01 AM  [place], [place], [address] Phone: [phone]   Fax:  [phone]  Name: [name] MRN: [id number] Date of Birth: 07-21-1965

## 2017-11-13 ENCOUNTER — Ambulatory Visit: Payer: Medicare Other | Attending: Family Medicine | Admitting: Physical Therapy

## 2017-11-13 DIAGNOSIS — R262 Difficulty in walking, not elsewhere classified: Secondary | ICD-10-CM | POA: Insufficient documentation

## 2017-11-13 DIAGNOSIS — R296 Repeated falls: Secondary | ICD-10-CM | POA: Insufficient documentation

## 2017-11-13 DIAGNOSIS — E1142 Type 2 diabetes mellitus with diabetic polyneuropathy: Secondary | ICD-10-CM | POA: Insufficient documentation

## 2017-11-13 DIAGNOSIS — M6281 Muscle weakness (generalized): Secondary | ICD-10-CM | POA: Insufficient documentation

## 2017-11-20 ENCOUNTER — Encounter: Payer: Self-pay | Admitting: Physical Therapy

## 2017-11-20 ENCOUNTER — Ambulatory Visit: Payer: Medicare Other | Admitting: Physical Therapy

## 2017-11-20 DIAGNOSIS — R296 Repeated falls: Secondary | ICD-10-CM | POA: Diagnosis present

## 2017-11-20 DIAGNOSIS — M6281 Muscle weakness (generalized): Secondary | ICD-10-CM | POA: Diagnosis not present

## 2017-11-20 DIAGNOSIS — R262 Difficulty in walking, not elsewhere classified: Secondary | ICD-10-CM

## 2017-11-20 NOTE — Therapy (Addendum)
[place], [place], [address] Phone: [phone]   Fax:  [phone]  Physical Therapy Treatment  Patient Details  Name: [name] MRN: [id number] Date of Birth: 06-27-1965 Referring Provider: Precious Haws, MD   Encounter Date: 11/20/2017  PT End of Session - 11/20/17 1141    Visit Number  6    Authorization Type  [place], KX modifier after visit 15    PT Start Time  1111    PT Stop Time  1145    PT Time Calculation (min)  34 min    Activity Tolerance  Patient limited by fatigue;Patient tolerated treatment well    Behavior During Therapy  [facility] for tasks assessed/performed;Flat affect       Past Medical History:  Diagnosis Date  . Acute kidney injury ([place]) 12/28/2014  . Adjustment disorder with mixed anxiety and depressed mood 12/28/2014  . CPAP (continuous positive airway pressure) dependence   . Diabetes mellitus without complication ([place])   . DM (diabetes mellitus) type II controlled with renal manifestation ([place]) 12/28/2014  . DM (diabetes mellitus) type II controlled, neurological manifestation ([place]) 12/28/2014  . Encephalopathy, hypertensive 12/28/2014  . Hypertension   . Hypertensive emergency without congestive heart failure 12/27/2014  . Possible Panic disorder 12/28/2014  . Renal disorder   . 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 Sleep apnea     Past Surgical History:  Procedure Laterality Date  . EMPYEMA DRAINAGE Right 05/17/2017   Procedure: EMPYEMA DRAINAGE AND DECORTICATION;  Surgeon: Grace Isaac, MD;  Location: [facility];  Service: Thoracic;  Laterality: Right;  . FLEXIBLE BRONCHOSCOPY  05/17/2017   Procedure: FLEXIBLE BRONCHOSCOPY;  Surgeon: Grace Isaac, MD;  Location: [facility];  Service: Thoracic;;  . IR THORACENTESIS ASP PLEURAL SPACE W/IMG GUIDE  05/17/2017  . NO PAST SURGERIES    . THORACOTOMY/LOBECTOMY Right 05/17/2017   Procedure: RIGHT MINI THORACOTOMY;  Surgeon:  Grace Isaac, MD;  Location: [facility];  Service: Thoracic;  Laterality: Right;  Marland Kitchen VIDEO ASSISTED THORACOSCOPY (VATS)/EMPYEMA Right 05/17/2017   Procedure: RIGHT VIDEO ASSISTED THORACOSCOPY (VATS)/EMPYEMA;  Surgeon: Grace Isaac, MD;  Location: [facility];  Service: Thoracic;  Laterality: Right;    There were no vitals filed for this visit.  Subjective Assessment - 11/20/17 1113    Subjective  "I am just a little tired"    Currently in Pain?  No/denies    Pain Score  0-No pain         [facility] PT Assessment - 11/20/17 0001      Strength   Strength Assessment Site  Hip    Right/Left Hip  Right;Left    Right Hip Flexion  4+/5    Right Hip Extension  4+/5    Right Hip ABduction  5/5    Right Hip ADduction  4+/5    Left Hip Flexion  4+/5    Left Hip Extension  4/5    Left Hip ABduction  5/5    Left Hip ADduction  4+/5      Transfers   Five time sit to stand comments   13.48 seconds                   [facility] Adult PT Treatment/Exercise - 11/20/17 0001      Knee/Hip Exercises: Aerobic   Recumbent Bike  L1 x4 min     Nustep  L5 6 MIN      Knee/Hip Exercises: Machines for  Strengthening   Cybex Knee Extension  20 lb 2 x 10    Cybex Knee Flexion  20 lb 2 x 10               PT Short Term Goals - 11/20/17 1133      PT SHORT TERM GOAL #2   Title  Pt will be able to improve his 5 time sit to stand </= 15 seconds with no UE support.     Status  Achieved        PT Long Term Goals - 11/20/17 1131      PT LONG TERM GOAL #3   Title  Pt will improve his strength in bilateral hips to 5/5 in order to improve gait and functional mobility.     Status  Partially Met            Plan - 11/20/17 1141    Clinical Impression Statement  Pt ~ 11 minutes late for today's session. He enters clinic reporting fatigue. Often time pt closes his eyes when resting and while doing exercises, but continues to report he is fine. Most goals have been met but he does have some  weakness in hips more so with extension.    Rehab Potential  Good    PT Frequency  2x / week    PT Duration  6 weeks    PT Treatment/Interventions  ADLs/Self Care Home Management;Gait training;Stair training;Functional mobility training;Therapeutic exercise;Therapeutic activities;Manual techniques;Patient/family education;Neuromuscular re-education;Passive range of motion;Taping    PT Next Visit Plan  aerobic exercises to improve endurance and LE strength, balance exercises and postural exercises to improve thoracic expansion due to h/o r rib fx       Patient will benefit from skilled therapeutic intervention in order to improve the following deficits and impairments:  Pain, Postural dysfunction, Decreased balance, Difficulty walking, Decreased strength, Decreased activity tolerance  Visit Diagnosis: Muscle weakness (generalized)  Difficulty in walking, not elsewhere classified  Repeated falls     Problem List Patient Active Problem List   Diagnosis Date Noted  . AKI (acute kidney injury) ([place]) 07/28/2017  . Right sided Flank pain 07/27/2017  . Weakness 07/27/2017  . Myalgia 07/27/2017  . Pneumonia of right lung due to infectious organism   . Pleural effusion   . Uncontrolled hypertension   . Post-operative pain   . Diabetes mellitus type 2 in nonobese (HCC)   . Acute diastolic congestive heart failure ([place])   . Encephalopathy   . Stage 4 chronic kidney disease ([place])   . SIRS (systemic inflammatory response syndrome) (HCC)   . Chest tube in place   . 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[place])   . Postoperative pain   . Pleural effusion on right 05/17/2017  . Empyema ([place]) 05/16/2017  . Encephalopathy, hypertensive 12/28/2014  . Acute kidney injury ([place]) 12/28/2014  . Type 2 diabetes mellitus with hyperglycemia, with long-term current use of insulin ([place]) 12/28/2014  . DM (diabetes mellitus) type II controlled, neurological manifestation ([place]) 12/28/2014  . Adjustment disorder with mixed anxiety  and depressed mood 12/28/2014  . Possible Panic disorder 12/28/2014  . Hypertensive emergency without congestive heart failure 12/27/2014  . Hyponatremia 06/27/2013  . Essential hypertension, malignant 06/27/2013  . Hyperglycemia 06/26/2013  . DKA (diabetic ketoacidoses) ([place]) 06/26/2013  . Severe uncontrolled hypertension 03/31/2011  . Hypokalemia 03/28/2011  . Essential hypertension 03/28/2011  . CKD (chronic kidney disease) stage 3, GFR 30-59 ml/min (HCC) 03/28/2011   PHYSICAL THERAPY DISCHARGE SUMMARY  Visits from [facility]  of Care: 6 Plan: Patient agrees to discharge.  Patient goals were partially met. Patient is being discharged due to not returning since the last visit.  ?????     Scot Jun, PTA 11/20/2017, 11:43 AM  [place], [place], [address] Phone: [phone]   Fax:  [phone]  Name: Rochelle Mino MRN: [id number] Date of Birth: 1965-12-05

## 2017-11-22 ENCOUNTER — Encounter: Payer: Medicare Other | Admitting: Physical Therapy

## 2017-11-25 ENCOUNTER — Encounter (HOSPITAL_COMMUNITY): Payer: Self-pay | Admitting: Emergency Medicine

## 2017-11-25 ENCOUNTER — Other Ambulatory Visit: Payer: Self-pay

## 2017-11-25 ENCOUNTER — Emergency Department (HOSPITAL_COMMUNITY)
Admission: EM | Admit: 2017-11-25 | Discharge: 2017-11-26 | Disposition: A | Discharge status: Home / Self Care | Payer: Medicare Other | Attending: Emergency Medicine | Admitting: Emergency Medicine

## 2017-11-25 DIAGNOSIS — Z79899 Other long term (current) drug therapy: Secondary | ICD-10-CM | POA: Insufficient documentation

## 2017-11-25 DIAGNOSIS — Z794 Long term (current) use of insulin: Secondary | ICD-10-CM | POA: Insufficient documentation

## 2017-11-25 DIAGNOSIS — I5031 Acute diastolic (congestive) heart failure: Secondary | ICD-10-CM | POA: Diagnosis not present

## 2017-11-25 DIAGNOSIS — E119 Type 2 diabetes mellitus without complications: Secondary | ICD-10-CM | POA: Insufficient documentation

## 2017-11-25 DIAGNOSIS — E1165 Type 2 diabetes mellitus with hyperglycemia: Secondary | ICD-10-CM | POA: Insufficient documentation

## 2017-11-25 DIAGNOSIS — R739 Hyperglycemia, unspecified: Secondary | ICD-10-CM

## 2017-11-25 DIAGNOSIS — I130 Hypertensive heart and chronic kidney disease with heart failure and stage 1 through stage 4 chronic kidney disease, or unspecified chronic kidney disease: Secondary | ICD-10-CM | POA: Diagnosis not present

## 2017-11-25 DIAGNOSIS — N184 Chronic kidney disease, stage 4 (severe): Secondary | ICD-10-CM | POA: Diagnosis not present

## 2017-11-25 LAB — CBC
HEMATOCRIT: 39.8 % (ref 39.0–52.0)
Hemoglobin: 13.1 g/dL (ref 13.0–17.0)
MCH: 29.7 pg (ref 26.0–34.0)
MCHC: 32.9 g/dL (ref 30.0–36.0)
MCV: 90.2 fL (ref 78.0–100.0)
Platelets: 337 10*3/uL (ref 150–400)
RBC: 4.41 MIL/uL (ref 4.22–5.81)
RDW: 11.9 % (ref 11.5–15.5)
WBC: 8.4 10*3/uL (ref 4.0–10.5)

## 2017-11-25 LAB — CBG MONITORING, ED
Glucose-Capillary: 576 mg/dL (ref 70–99)
Glucose-Capillary: 512 mg/dL (ref 70–99)

## 2017-11-25 LAB — BASIC METABOLIC PANEL
Anion gap: 11 (ref 5–15)
BUN: 57 mg/dL — AB (ref 6–20)
CHLORIDE: 94 mmol/L — AB (ref 98–111)
CO2: 20 mmol/L — AB (ref 22–32)
Calcium: 9.3 mg/dL (ref 8.9–10.3)
Creatinine, Ser: 4.05 mg/dL — ABNORMAL HIGH (ref 0.61–1.24)
GFR calc Af Amer: 18 mL/min — ABNORMAL LOW (ref >60)
GFR, EST NON AFRICAN AMERICAN: 16 mL/min — AB (ref >60)
GLUCOSE: 602 mg/dL — AB (ref 70–99)
Potassium: 4.9 mmol/L (ref 3.5–5.1)
Sodium: 125 mmol/L — ABNORMAL LOW (ref 135–145)

## 2017-11-25 MED ORDER — INSULIN ASPART 100 UNIT/ML ~~LOC~~ SOLN
10.0000 [IU] | Freq: Once | SUBCUTANEOUS | Status: AC
  Administered 2017-11-26: 10 [IU] via INTRAVENOUS
  Filled 2017-11-25: qty 1

## 2017-11-25 MED ORDER — SODIUM CHLORIDE 0.9 % IV BOLUS
1000.0000 mL | Freq: Once | INTRAVENOUS | Status: AC
  Administered 2017-11-26: 1000 mL via INTRAVENOUS

## 2017-11-25 NOTE — ED Triage Notes (Signed)
Pt states he saw his physician today and was sent here d/t home CBG reading over 500. Pt has been out of his medication. Endorses blurry vision and feeling "sleepy"

## 2017-11-25 NOTE — ED Provider Notes (Signed)
[facility] EMERGENCY DEPARTMENT Provider Note   CSN: [id number] Arrival date & time: 11/25/17  2035     History   Chief Complaint Chief Complaint  Patient presents with  . Hyperglycemia    HPI [name] is a 52 y.o. male past medical history of diabetes, hypertension who presents for evaluation of hyperglycemia.  Patient reports that he was seen by his primary care doctor today and was told that his blood sugar was high and needed to come to the ED for further evaluation.  Patient reports that he has not been taking his insulin over the last 6 days.  Patient reports that he ran out of medication and was unable to fill the prescription.  Patient states that he has not taken any medication since then.  He has not had any abdominal palm pain, nausea/vomiting.  Patient reports that is been more tired than usual.  She denies any fevers, chest pain, difficulty breathing, abdominal pain, nausea/vomiting, numbness/weakness of his arms or legs.  The history is provided by the patient.    Past Medical History:  Diagnosis Date  . Acute kidney injury ([place]) 12/28/2014  . Adjustment disorder with mixed anxiety and depressed mood 12/28/2014  . CPAP (continuous positive airway pressure) dependence   . Diabetes mellitus without complication ([place])   . DM (diabetes mellitus) type II controlled with renal manifestation ([place]) 12/28/2014  . DM (diabetes mellitus) type II controlled, neurological manifestation ([place]) 12/28/2014  . Encephalopathy, hypertensive 12/28/2014  . Hypertension   . Hypertensive emergency without congestive heart failure 12/27/2014  . Possible Panic disorder 12/28/2014  . Renal disorder   . 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 Sleep apnea     Patient Active Problem List   Diagnosis Date Noted  . AKI (acute kidney injury) ([place]) 07/28/2017  . Right sided Flank pain 07/27/2017  . Weakness 07/27/2017  . Myalgia 07/27/2017  . Pneumonia of right lung due to  infectious organism   . Pleural effusion   . Uncontrolled hypertension   . Post-operative pain   . Diabetes mellitus type 2 in nonobese (HCC)   . Acute diastolic congestive heart failure ([place])   . Encephalopathy   . Stage 4 chronic kidney disease ([place])   . SIRS (systemic inflammatory response syndrome) (HCC)   . Chest tube in place   . 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[place])   . Postoperative pain   . Pleural effusion on right 05/17/2017  . Empyema ([place]) 05/16/2017  . Encephalopathy, hypertensive 12/28/2014  . Acute kidney injury ([place]) 12/28/2014  . Type 2 diabetes mellitus with hyperglycemia, with long-term current use of insulin ([place]) 12/28/2014  . DM (diabetes mellitus) type II controlled, neurological manifestation ([place]) 12/28/2014  . Adjustment disorder with mixed anxiety and depressed mood 12/28/2014  . Possible Panic disorder 12/28/2014  . Hypertensive emergency without congestive heart failure 12/27/2014  . Hyponatremia 06/27/2013  . Essential hypertension, malignant 06/27/2013  . Hyperglycemia 06/26/2013  . DKA (diabetic ketoacidoses) ([place]) 06/26/2013  . Severe uncontrolled hypertension 03/31/2011  . Hypokalemia 03/28/2011  . Essential hypertension 03/28/2011  . CKD (chronic kidney disease) stage 3, GFR 30-59 ml/min ([place]) 03/28/2011    Past Surgical History:  Procedure Laterality Date  . EMPYEMA DRAINAGE Right 05/17/2017   Procedure: EMPYEMA DRAINAGE AND DECORTICATION;  Surgeon: Grace Isaac, MD;  Location: [facility];  Service: Thoracic;  Laterality: Right;  . FLEXIBLE BRONCHOSCOPY  05/17/2017   Procedure: FLEXIBLE BRONCHOSCOPY;  Surgeon: Grace Isaac, MD;  Location: [facility];  Service:  Thoracic;;  . IR THORACENTESIS ASP PLEURAL SPACE W/IMG GUIDE  05/17/2017  . NO PAST SURGERIES    . THORACOTOMY/LOBECTOMY Right 05/17/2017   Procedure: RIGHT MINI THORACOTOMY;  Surgeon: Grace Isaac, MD;  Location: [facility];  Service: Thoracic;  Laterality: Right;  Marland Kitchen VIDEO ASSISTED THORACOSCOPY  (VATS)/EMPYEMA Right 05/17/2017   Procedure: RIGHT VIDEO ASSISTED THORACOSCOPY (VATS)/EMPYEMA;  Surgeon: Grace Isaac, MD;  Location: [facility];  Service: Thoracic;  Laterality: Right;        Home Medications    Prior to Admission medications   Medication Sig Start Date End Date Taking? Authorizing Provider  acetaminophen (TYLENOL) 325 MG tablet Take 2 tablets (650 mg total) by mouth every 6 (six) hours as needed for mild pain, moderate pain or headache. Patient taking differently: Take 1,000 mg by mouth every 6 (six) hours as needed for mild pain, moderate pain or headache.  04/04/17   Waynetta Pean, PA-C  albuterol (PROVENTIL HFA;VENTOLIN HFA) 108 (90 BASE) MCG/ACT inhaler Inhale 1-2 puffs into the lungs every 6 (six) hours as needed for wheezing or shortness of breath.    [provider]  amLODipine (NORVASC) 10 MG tablet Take 1 tablet (10 mg total) by mouth daily. 12/29/14   Smiley Houseman, MD  aspirin EC 81 MG tablet Take 81 mg by mouth daily.    [provider]  cloNIDine (CATAPRES - DOSED IN MG/24 HR) 0.2 mg/24hr patch Place 1 patch (0.2 mg total) onto the skin once a week. 05/22/17   Bufford Lope, DO  furosemide (LASIX) 80 MG tablet Take 120 mg by mouth daily.    [provider]  gabapentin (NEURONTIN) 300 MG capsule Take 300 mg by mouth 2 (two) times daily.    [provider]  insulin aspart (NOVOLOG) 100 UNIT/ML injection Inject 2-12 Units into the skin 3 (three) times daily with meals. Before each meal 3 times a day, 140-199 - 2 units, 200-250 - 6 units, 251-299 - 8 units,  300-349 - 10 units,  350 or above 12 units. Sliding scale 11/26/17   [place] Lanius A, PA-C  insulin glargine (LANTUS) 100 UNIT/ML injection Inject 0.16 mLs (16 Units total) into the skin daily for 14 days. 11/26/17 12/10/17  Volanda Napoleon, PA-C  labetalol (NORMODYNE) 200 MG tablet Take 400 mg by mouth 2 (two) times daily.  12/14/15   [provider]  potassium  chloride SA (K-DUR,KLOR-CON) 20 MEQ tablet Take 20 mEq by mouth every other day.     [provider]  sertraline (ZOLOFT) 100 MG tablet Take 100 mg by mouth daily. 07/04/17   [provider]  spironolactone (ALDACTONE) 50 MG tablet Take 1 tablet (50 mg total) by mouth 2 (two) times daily. 05/23/17   Bufford Lope, DO  tiZANidine (ZANAFLEX) 4 MG tablet Take 4 mg by mouth every 8 (eight) hours as needed for muscle spasms.  04/08/17   [provider]    Family History Family History  Problem Relation Age of Onset  . Hypertension Mother     Social History Social History   Tobacco Use  . Smoking status: Never Smoker  . Smokeless tobacco: Never Used  Substance Use Topics  . Alcohol use: No  . Drug use: No     Allergies   Patient has no known allergies.   Review of Systems Review of Systems  Constitutional: Positive for fatigue. Negative for fever.  Respiratory: Negative for cough and shortness of breath.   Cardiovascular:  Negative for chest pain.  Gastrointestinal: Negative for abdominal pain, nausea and vomiting.  Genitourinary: Negative for dysuria and hematuria.  Neurological: Negative for headaches.     Physical Exam Updated Vital Signs BP (!) 160/100 (BP Location: Right Arm)   Pulse 80   Temp 97.7 F (36.5 C) (Oral)   Resp 20   Ht _0  (1.702 m)   Wt 112.9 kg   SpO2 100%   BMI 39.00 kg/m   Physical Exam  Constitutional: He is oriented to person, place, and time. He appears well-developed and well-nourished.  Sleeping comfortably on examination table  HENT:  Head: Normocephalic and atraumatic.  Mouth/Throat: Oropharynx is clear and moist and mucous membranes are normal.  Eyes: Pupils are equal, round, and reactive to light. Conjunctivae, EOM and lids are normal.  Neck: Full passive range of motion without pain.  Cardiovascular: Normal rate, regular rhythm, normal heart sounds and normal pulses. Exam reveals no gallop and no friction  rub.  No murmur heard. Pulmonary/Chest: Effort normal and breath sounds normal.  Lungs clear to auscultation bilaterally.  Symmetric chest rise.  No wheezing, rales, rhonchi.  Abdominal: Soft. Normal appearance. There is no tenderness. There is no rigidity and no guarding.  Abdomen is soft, non-distended, non-tender. No rigidity, No guarding. No peritoneal signs.  Musculoskeletal: Normal range of motion.  Neurological: He is alert and oriented to person, place, and time.  Follows commands, Moves all extremities  5/5 strength to BUE and BLE  Sensation intact throughout all major nerve distributions  Skin: Skin is warm and dry. Capillary refill takes less than 2 seconds.  Psychiatric: He has a normal mood and affect. His speech is normal.  Nursing note and vitals reviewed.    ED Treatments / Results  Labs (all labs ordered are listed, but only abnormal results are displayed) Labs Reviewed  BASIC METABOLIC PANEL - Abnormal; Notable for the following components:      Result Value   Sodium 125 (*)    Chloride 94 (*)    CO2 20 (*)    Glucose, Bld 602 (*)    BUN 57 (*)    Creatinine, Ser 4.05 (*)    GFR calc non Af Amer 16 (*)    GFR calc Af Amer 18 (*)    All other components within normal limits  URINALYSIS, ROUTINE W REFLEX MICROSCOPIC - Abnormal; Notable for the following components:   Color, Urine STRAW (*)    Glucose, UA >=500 (*)    All other components within normal limits  CBG MONITORING, ED - Abnormal; Notable for the following components:   Glucose-Capillary 576 (*)    All other components within normal limits  CBG MONITORING, ED - Abnormal; Notable for the following components:   Glucose-Capillary 512 (*)    All other components within normal limits  CBG MONITORING, ED - Abnormal; Notable for the following components:   Glucose-Capillary 314 (*)    All other components within normal limits  CBC  BETA-HYDROXYBUTYRIC ACID    EKG None  Radiology No results  found.  Procedures Procedures (including critical care time)  Medications Ordered in ED Medications  insulin aspart (novoLOG) injection 4 Units (has no administration in time range)  sodium chloride 0.9 % bolus 1,000 mL (1,000 mLs Intravenous New Bag/Given 11/26/17 0026)  insulin aspart (novoLOG) injection 10 Units (10 Units Intravenous Given 11/26/17 0028)     Initial Impression / Assessment and Plan / ED Course  I have reviewed the triage vital  signs and the nursing notes.  Pertinent labs & imaging results that were available during my care of the patient were reviewed by me and considered in my medical decision making (see chart for details).     52 y.o. M with PMH/o past medical history of diabetes, hypertension who presents for evaluation of hyperglycemia.  Patient reports he has been out of his insulin for the last 5 days.  Was told to come to the ED by his primary care doctor when his blood sugar was found to be high.  Denies any complaints at this time. Patient is afebrile, non-toxic appearing, sitting comfortably on examination table. Vital signs reviewed and stable.  Initial labs ordered at triage.  BMP shows sodium 125, CO2 of 28, glucose 602.  BUN is elevated at 57 and creatinine is elevated at 4.05.  Review of his records show that he had previously had elevation of BUN and creatinine and today's appears at baseline.  Anion gap is 11.  CBC shows no leukocytosis, anemia.  At this time, patient's blood sugar is elevated at 602.  Carbon is 20.  Anion gap is 11.  Low suspicion for DKA at this time given lack of abdominal pain, nausea/vomiting and work-up.  Will add additional beta hydroxybutyric acid for evaluation.  UA reviewed.  Negative for any ketones.  Beta hydroxybutyrate acid is negative.  Discussed results with patient.  He is still currently denying any symptoms.  Repeat CBG shows it is 314 after insulin and fluids.  Will give additional subcutaneous insulin.  This time,  patient does not appear to be in DKA.  Patient can be discharged home.  We will plan to refill his insulin.  Patient encouraged to follow-up with his primary care doctor for evaluation. Patient had ample opportunity for questions and discussion. All patient's questions were answered with full understanding. Strict return precautions discussed. Patient expresses understanding and agreement to plan.   Final Clinical Impressions(s) / ED Diagnoses   Final diagnoses:  Hyperglycemia    ED Discharge Orders         Ordered    insulin glargine (LANTUS) 100 UNIT/ML injection  Daily     11/26/17 0120    insulin aspart (NOVOLOG) 100 UNIT/ML injection  3 times daily with meals     11/26/17 0120           Volanda Napoleon, PA-C 11/26/17 0127    Orpah Greek, MD 11/26/17 [phone]

## 2017-11-26 DIAGNOSIS — E1165 Type 2 diabetes mellitus with hyperglycemia: Secondary | ICD-10-CM | POA: Diagnosis not present

## 2017-11-26 LAB — URINALYSIS, ROUTINE W REFLEX MICROSCOPIC
BACTERIA UA: NONE SEEN
BILIRUBIN URINE: NEGATIVE
Glucose, UA: >=500 mg/dL — AB
Hgb urine dipstick: NEGATIVE
KETONES UR: NEGATIVE mg/dL
LEUKOCYTES UA: NEGATIVE
NITRITE: NEGATIVE
PH: 5 (ref 5.0–8.0)
PROTEIN: NEGATIVE mg/dL
Specific Gravity, Urine: 1.018 (ref 1.005–1.030)

## 2017-11-26 LAB — CBG MONITORING, ED: GLUCOSE-CAPILLARY: 314 mg/dL — AB (ref 70–99)

## 2017-11-26 LAB — BETA-HYDROXYBUTYRIC ACID: BETA-HYDROXYBUTYRIC ACID: 0.1 mmol/L (ref 0.05–0.27)

## 2017-11-26 MED ORDER — INSULIN GLARGINE 100 UNIT/ML ~~LOC~~ SOLN: 16.0000 [IU] | Freq: Every day | SUBCUTANEOUS | 3 refills | Status: DC

## 2017-11-26 MED ORDER — INSULIN ASPART 100 UNIT/ML ~~LOC~~ SOLN
4.0000 [IU] | Freq: Once | SUBCUTANEOUS | Status: AC
  Administered 2017-11-26: 4 [IU] via SUBCUTANEOUS
  Filled 2017-11-26: qty 1

## 2017-11-26 MED ORDER — INSULIN ASPART 100 UNIT/ML ~~LOC~~ SOLN: 2.0000 [IU] | Freq: Three times a day (TID) | SUBCUTANEOUS | 3 refills | Status: DC

## 2017-11-26 NOTE — Discharge Instructions (Signed)
Take your insulin as directed.  Follow-up with primary care doctor the next 2 to 4 days for further evaluation.  Return to emergency department for any chest pain, difficulty breathing, numbness/weakness of your arms or legs, vomiting, abdominal pain or any other worsening concerning symptoms.

## 2018-11-02 DIAGNOSIS — I5A Non-ischemic myocardial injury (non-traumatic): Secondary | ICD-10-CM | POA: Insufficient documentation

## 2018-11-02 DIAGNOSIS — R778 Other specified abnormalities of plasma proteins: Secondary | ICD-10-CM | POA: Insufficient documentation

## 2018-12-11 DIAGNOSIS — Z7901 Long term (current) use of anticoagulants: Secondary | ICD-10-CM | POA: Insufficient documentation

## 2019-08-04 ENCOUNTER — Encounter (HOSPITAL_BASED_OUTPATIENT_CLINIC_OR_DEPARTMENT_OTHER): Payer: Medicare Other | Attending: Internal Medicine | Admitting: Internal Medicine

## 2019-08-04 ENCOUNTER — Other Ambulatory Visit: Payer: Self-pay

## 2019-08-04 DIAGNOSIS — L97812 Non-pressure chronic ulcer of other part of right lower leg with fat layer exposed: Secondary | ICD-10-CM | POA: Diagnosis not present

## 2019-08-04 DIAGNOSIS — I890 Lymphedema, not elsewhere classified: Secondary | ICD-10-CM | POA: Insufficient documentation

## 2019-08-04 DIAGNOSIS — G4730 Sleep apnea, unspecified: Secondary | ICD-10-CM | POA: Diagnosis not present

## 2019-08-04 DIAGNOSIS — E1165 Type 2 diabetes mellitus with hyperglycemia: Secondary | ICD-10-CM | POA: Insufficient documentation

## 2019-08-04 DIAGNOSIS — I5032 Chronic diastolic (congestive) heart failure: Secondary | ICD-10-CM | POA: Insufficient documentation

## 2019-08-04 DIAGNOSIS — N184 Chronic kidney disease, stage 4 (severe): Secondary | ICD-10-CM | POA: Diagnosis not present

## 2019-08-04 DIAGNOSIS — E1142 Type 2 diabetes mellitus with diabetic polyneuropathy: Secondary | ICD-10-CM | POA: Insufficient documentation

## 2019-08-04 DIAGNOSIS — E1122 Type 2 diabetes mellitus with diabetic chronic kidney disease: Secondary | ICD-10-CM | POA: Diagnosis not present

## 2019-08-04 DIAGNOSIS — I130 Hypertensive heart and chronic kidney disease with heart failure and stage 1 through stage 4 chronic kidney disease, or unspecified chronic kidney disease: Secondary | ICD-10-CM | POA: Insufficient documentation

## 2019-08-04 DIAGNOSIS — E11622 Type 2 diabetes mellitus with other skin ulcer: Secondary | ICD-10-CM | POA: Insufficient documentation

## 2019-08-04 NOTE — Progress Notes (Signed)
[name], [name] ([id number]) Visit Report for 08/04/2019 Abuse/Suicide Risk Screen Details Patient Name: Date of Service: [name], [name] 08/04/2019 1:15 PM Medical Record [id number] Patient Account Number: [id number] Date of Birth/Sex: Treating RN: [birth year] (54 y.o. [name]) [name] Primary Care Rhyann Berton: Precious Haws Other Clinician: Referring Perle Brickhouse: Treating Danyel Tobey/Extender:Robson, Daisy Lazar, [name] in Treatment: 0 Abuse/Suicide Risk Screen Items Answer ABUSE RISK SCREEN: Has anyone close to you tried to hurt or harm you recentlyo No Do you feel uncomfortable with anyone in your familyo No Has anyone forced you do things that you didnt want to doo No Electronic Signature(s) Signed: 08/04/2019 5:27:53 PM By: Deon Pilling Signed: 08/04/2019 5:28:23 PM By: [name] RN Entered By: Deon Pilling on 08/04/2019 13:23:24 -------------------------------------------------------------------------------- Activities of Daily Living Details Patient Name: Date of Service: [name], [name] 08/04/2019 1:15 PM Medical Record [id number] Patient Account Number: [id number] Date of Birth/Sex: Treating RN: 10-02-1965 (53 y.o. [name]) [name] Primary Care Traniya Prichett: Precious Haws Other Clinician: Referring Jerrell Hart: Treating Greysyn Vanderberg/Extender:Robson, Daisy Lazar, [name] in Treatment: 0 Activities of Daily Living Items Answer Activities of Daily Living (Please select one for each item) Drive Automobile Completely Able Take Medications Completely Able Use Telephone Completely Able Care for Appearance Completely Able Use Toilet Completely Able Bath / Shower Completely Able Dress Self Completely Able Feed Self Completely Able Walk Completely Able Get In / Out Bed Completely Able Housework Completely Able Prepare Meals Completely [place] for Self Completely Able Electronic Signature(s) Signed: 08/04/2019 5:27:53 PM By: Deon Pilling Signed: 08/04/2019 5:28:23 PM By: [name] RN Entered By: Deon Pilling on 08/04/2019 13:23:49 -------------------------------------------------------------------------------- Education Screening Details Patient Name: Date of Service: [name], [name] 08/04/2019 1:15 PM Medical Record [id number] Patient Account Number: [id number] Date of Birth/Sex: Treating RN: February 20, 1966 (53 y.o. [name] Primary Care Taysean Wager: Precious Haws Other Clinician: Referring Gricel Copen: Treating Rosalyn Archambault/Extender:Robson, Daisy Lazar, [name] in Treatment: 0 Primary Learner Assessed: Patient Learning Preferences/Education Level/Primary Language Learning Preference: Explanation, Demonstration, Printed Material Highest Education Level: High School Preferred Language: English Cognitive Barrier Language Barrier: No Translator Needed: No Memory Deficit: No Emotional Barrier: No Cultural/Religious Beliefs Affecting Medical Care: No Physical Barrier Impaired Vision: Yes Glasses Impaired Hearing: No Decreased Hand dexterity: No Knowledge/Comprehension Knowledge Level: High Comprehension Level: High Ability to understand written High instructions: Ability to understand verbal High instructions: Motivation Anxiety Level: Calm Cooperation: Cooperative Education Importance: Acknowledges Need Interest in Health Problems: Asks Questions Perception: Coherent Willingness to Engage in Self- High Management Activities: Readiness to Engage in Self- High Management Activities: Electronic Signature(s) Signed: 08/04/2019 5:27:53 PM By: Deon Pilling Signed: 08/04/2019 5:28:23 PM By: [name] RN Entered By: Deon Pilling on 08/04/2019 13:24:18 -------------------------------------------------------------------------------- Fall Risk Assessment Details Patient Name: Date of Service: [name], [name] 08/04/2019 1:15 PM Medical Record [id number] Patient Account Number: [id number] Date  of Birth/Sex: Treating RN: [birth year] (53 y.o. [name]) [name] Primary Care Jenan Ellegood: Precious Haws Other Clinician: Referring Calleen Alvis: Treating Zaire Vanbuskirk/Extender:Robson, Daisy Lazar, [name] in Treatment: 0 Fall Risk Assessment Items Have you had 2 or more falls in the last 12 monthso 0 No Have you had any fall that resulted in injury in the last 12 monthso 0 No FALLS RISK SCREEN History of falling - immediate or within 3 months 0 No Secondary diagnosis (Do you have 2 or more medical diagnoseso) 0 No Ambulatory aid None/bed rest/wheelchair/nurse 0 Yes Crutches/cane/walker 0 No Furniture 0 No Intravenous therapy Access/Saline/Heparin Lock 0 No Weak (short steps with or without shuffle, stooped but able  to lift head 0 No while walking, may seek support from furniture) Impaired (short steps with shuffle, may have difficulty arising from chair, 0 No head down, impaired balance) Mental Status Oriented to own ability 0 Yes Overestimates or forgets limitations 0 No Risk Level: Low Risk Score: 0 Electronic Signature(s) Signed: 08/04/2019 5:27:53 PM By: Deon Pilling Signed: 08/04/2019 5:28:23 PM By: [name] RN Entered By: Deon Pilling on 08/04/2019 13:24:55 -------------------------------------------------------------------------------- Foot Assessment Details Patient Name: Date of Service: [name], [name] 08/04/2019 1:15 PM Medical Record [id number] Patient Account Number: [id number] Date of Birth/Sex: Treating RN: 23-Dec-1965 (53 y.o. [name]) [name] Primary Care Mava Suares: Precious Haws Other Clinician: Referring Arica Bevilacqua: Treating Rajinder Mesick/Extender:Robson, Daisy Lazar, [name] in Treatment: 0 Foot Assessment Items Site Locations + = Sensation present, - = Sensation absent, C = Callus, U = Ulcer R = Redness, W = Warmth, M = Maceration, PU = Pre-ulcerative lesion F = Fissure, S = Swelling, D = Dryness Assessment Right: Left: Other Deformity: No No Prior Foot  Ulcer: No No Prior Amputation: No No Charcot Joint: No No Ambulatory Status: Ambulatory Without Help Gait: Steady Electronic Signature(s) Signed: 08/04/2019 5:27:53 PM By: Deon Pilling Signed: 08/04/2019 5:28:23 PM By: [name] RN Entered By: Deon Pilling on 08/04/2019 13:39:43 -------------------------------------------------------------------------------- Nutrition Risk Screening Details Patient Name: Date of Service: [name], [name] 08/04/2019 1:15 PM Medical Record [id number] Patient Account Number: [id number] Date of Birth/Sex: Treating RN: 05-21-1965 (53 y.o. [name]) [name] Primary Care Ladona Rosten: Precious Haws Other Clinician: Referring Myranda Pavone: Treating Chesnee Floren/Extender:Robson, Daisy Lazar, [name] in Treatment: 0 Height (in): 66 Weight (lbs): 256 Body Mass Index (BMI): 41.3 Nutrition Risk Screening Items Score Screening NUTRITION RISK SCREEN: I have an illness or condition that made me change the kind and/or amount 2 Yes of food I eat I eat fewer than two meals per day 0 No I eat few fruits and vegetables, or milk products 0 No I have three or more drinks of beer, liquor or wine almost every day 0 No I have tooth or mouth problems that make it hard for me to eat 0 No I don't always have enough money to buy the food I need 0 No I eat alone most of the time 0 No I take three or more different prescribed or over-the-counter drugs a day 1 Yes 0 No Without wanting to, I have lost or gained 10 pounds in the last six months I am not always physically able to shop, cook and/or feed myself 0 No Nutrition Protocols Good Risk Protocol Provide education on elevated blood sugars and Moderate Risk Protocol 0 impact on wound healing, as applicable High Risk Proctocol Risk Level: Moderate Risk Score: 3 Electronic Signature(s) Signed: 08/04/2019 5:27:53 PM By: Deon Pilling Signed: 08/04/2019 5:28:23 PM By: [name] RN Entered By: Deon Pilling on 08/04/2019  13:25:31
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
## 2019-08-13 ENCOUNTER — Other Ambulatory Visit: Payer: Self-pay

## 2019-08-13 ENCOUNTER — Encounter (HOSPITAL_BASED_OUTPATIENT_CLINIC_OR_DEPARTMENT_OTHER): Payer: Medicare Other | Attending: Internal Medicine | Admitting: Internal Medicine

## 2019-08-13 DIAGNOSIS — I130 Hypertensive heart and chronic kidney disease with heart failure and stage 1 through stage 4 chronic kidney disease, or unspecified chronic kidney disease: Secondary | ICD-10-CM | POA: Diagnosis not present

## 2019-08-13 DIAGNOSIS — S80811D Abrasion, right lower leg, subsequent encounter: Secondary | ICD-10-CM | POA: Insufficient documentation

## 2019-08-13 DIAGNOSIS — E1165 Type 2 diabetes mellitus with hyperglycemia: Secondary | ICD-10-CM | POA: Diagnosis not present

## 2019-08-13 DIAGNOSIS — E1142 Type 2 diabetes mellitus with diabetic polyneuropathy: Secondary | ICD-10-CM | POA: Diagnosis not present

## 2019-08-13 DIAGNOSIS — E11622 Type 2 diabetes mellitus with other skin ulcer: Secondary | ICD-10-CM | POA: Diagnosis not present

## 2019-08-13 DIAGNOSIS — I4891 Unspecified atrial fibrillation: Secondary | ICD-10-CM | POA: Diagnosis not present

## 2019-08-13 DIAGNOSIS — N184 Chronic kidney disease, stage 4 (severe): Secondary | ICD-10-CM | POA: Diagnosis not present

## 2019-08-13 DIAGNOSIS — E1151 Type 2 diabetes mellitus with diabetic peripheral angiopathy without gangrene: Secondary | ICD-10-CM | POA: Diagnosis not present

## 2019-08-13 DIAGNOSIS — E1122 Type 2 diabetes mellitus with diabetic chronic kidney disease: Secondary | ICD-10-CM | POA: Diagnosis not present

## 2019-08-13 DIAGNOSIS — G4730 Sleep apnea, unspecified: Secondary | ICD-10-CM | POA: Diagnosis not present

## 2019-08-13 DIAGNOSIS — I5032 Chronic diastolic (congestive) heart failure: Secondary | ICD-10-CM | POA: Diagnosis not present

## 2019-08-13 DIAGNOSIS — X58XXXD Exposure to other specified factors, subsequent encounter: Secondary | ICD-10-CM | POA: Insufficient documentation

## 2019-08-13 DIAGNOSIS — I890 Lymphedema, not elsewhere classified: Secondary | ICD-10-CM | POA: Diagnosis not present

## 2019-08-13 DIAGNOSIS — L97812 Non-pressure chronic ulcer of other part of right lower leg with fat layer exposed: Secondary | ICD-10-CM | POA: Diagnosis not present

## 2019-08-19 ENCOUNTER — Encounter (HOSPITAL_BASED_OUTPATIENT_CLINIC_OR_DEPARTMENT_OTHER): Payer: Medicare Other | Admitting: Physician Assistant

## 2019-08-19 DIAGNOSIS — E11622 Type 2 diabetes mellitus with other skin ulcer: Secondary | ICD-10-CM | POA: Diagnosis not present

## 2019-08-19 NOTE — Progress Notes (Signed)
Utz, Avelardo ([id number]) Visit Report for 08/04/2019 Allergy List Details Patient Name: Date of Service: [name], [name] 08/04/2019 1:15 PM Medical Record Number: [id number] Patient Account Number: [id number] Date of Birth/Sex: Treating RN: 08-04-1965 (53 y.o. [name]) [name] Primary Care Kynedi Profitt: Precious Haws Other Clinician: Referring Irma Roulhac: Treating Derald Lorge/Extender: Norina Buzzard, [name] in Treatment: 0 Allergies Active Allergies No Known Drug Allergies Allergy Notes Electronic Signature(s) Signed: 08/04/2019 5:27:53 PM By: Deon Pilling Entered By: Deon Pilling on 08/04/2019 13:23:11 -------------------------------------------------------------------------------- Arrival Information Details Patient Name: Date of Service: [name], [name] 08/04/2019 1:15 PM Medical Record Number: [id number] Patient Account Number: [id number] Date of Birth/Sex: Treating RN: January 04, 1966 (53 y.o. [name] Primary Care Jaylean Buenaventura: Precious Haws Other Clinician: Referring Bonifacio Pruden: Treating Arthella Headings/Extender: [name] in Treatment: 0 Visit Information Patient Arrived: Ambulatory Arrival Time: 13:14 Accompanied By: mother Transfer Assistance: None Patient Identification Verified: Yes Secondary Verification Process Completed: Yes Electronic Signature(s) Signed: 08/19/2019 9:09:16 AM By: Sandre Kitty Entered By: Sandre Kitty on 08/04/2019 13:15:10 -------------------------------------------------------------------------------- Clinic Level of Care Assessment Details Patient Name: Date of Service: [name], [name] 08/04/2019 1:15 PM Medical Record Number: [id number] Patient Account Number: [id number] Date of Birth/Sex: Treating RN: January 07, 1966 (53 y.o. [name] Primary Care Sky Borboa: Precious Haws Other Clinician: Referring Gavyn Zoss: Treating Isys Tietje/Extender: Norina Buzzard, [name] in Treatment: 0 Clinic Level of Care Assessment  Items TOOL 1 Quantity Score X- 1 0 Use when EandM and Procedure is performed on INITIAL visit ASSESSMENTS - Nursing Assessment / Reassessment X- 1 20 General Physical Exam (combine w/ comprehensive assessment (listed just below) when performed on new pt. evals) X- 1 25 Comprehensive Assessment (HX, ROS, Risk Assessments, Wounds Hx, etc.) ASSESSMENTS - Wound and Skin Assessment / Reassessment []  - 0 Dermatologic / Skin Assessment (not related to wound area) ASSESSMENTS - Ostomy and/or Continence Assessment and Care []  - 0 Incontinence Assessment and Management []  - 0 Ostomy Care Assessment and Management (repouching, etc.) PROCESS - Coordination of Care X - Simple Patient / Family Education for ongoing care 1 15 []  - 0 Complex (extensive) Patient / Family Education for ongoing care X- 1 10 Staff obtains Programmer, systems, Records, T Results / Process Orders est []  - 0 Staff telephones HHA, Nursing Homes / Clarify orders / etc []  - 0 Routine Transfer to another Facility (non-emergent condition) []  - 0 Routine Hospital Admission (non-emergent condition) X- 1 15 New Admissions / Biomedical engineer / Ordering NPWT Apligraf, etc. , []  - 0 Emergency Hospital Admission (emergent condition) PROCESS - Special Needs []  - 0 Pediatric / Minor Patient Management []  - 0 Isolation Patient Management []  - 0 Hearing / Language / Visual special needs []  - 0 Assessment of Community assistance (transportation, D/C planning, etc.) []  - 0 Additional assistance / Altered mentation []  - 0 Support Surface(s) Assessment (bed, cushion, seat, etc.) INTERVENTIONS - Miscellaneous []  - 0 External ear exam []  - 0 Patient Transfer (multiple staff / Civil Service fast streamer / Similar devices) []  - 0 Simple Staple / Suture removal (25 or less) []  - 0 Complex Staple / Suture removal (26 or more) []  - 0 Hypo/Hyperglycemic Management (do not check if billed separately) X- 1 15 Ankle / Brachial Index (ABI) - do  not check if billed separately Has the patient been seen at the hospital within the last three years: Yes Total Score: 100 Level Of Care: New/Established - Level 3 Electronic Signature(s) Signed: 08/04/2019 5:28:23 PM By: [name] RN Entered By:  [name] on 08/04/2019 16:56:02 -------------------------------------------------------------------------------- Compression Therapy Details Patient Name: Date of Service: [name], [name] 08/04/2019 1:15 PM Medical Record Number: [id number] Patient Account Number: [id number] Date of Birth/Sex: Treating RN: 1965-12-28 (53 y.o. [name]) [name] Primary Care Ayva Veilleux: Precious Haws Other Clinician: Referring Erial Fikes: Treating Myrl Bynum/Extender: Norina Buzzard, [name] in Treatment: 0 Compression Therapy Performed for Wound Assessment: Wound #1 Right,Lateral Lower Leg Performed By: Clinician [name], RN Compression Type: Three Layer Post Procedure Diagnosis Same as Pre-procedure Electronic Signature(s) Signed: 08/04/2019 5:28:23 PM By: [name] RN Entered By: [name] on 08/04/2019 14:29:25 -------------------------------------------------------------------------------- Encounter Discharge Information Details Patient Name: Date of Service: [name], [name] 08/04/2019 1:15 PM Medical Record Number: [id number] Patient Account Number: [id number] Date of Birth/Sex: Treating RN: 10/31/1965 (54 y.o. [name] Primary Care Karena Kinker: Precious Haws Other Clinician: Referring Lin Glazier: Treating Dillian Feig/Extender: [name] in Treatment: 0 Encounter Discharge Information Items Post Procedure Vitals Discharge Condition: Stable Temperature (F): 98.5 Ambulatory Status: Ambulatory Pulse (bpm): 88 Discharge Destination: Home Respiratory Rate (breaths/min): 18 Transportation: Private Auto Blood Pressure (mmHg): 177/99 Accompanied By: family member Schedule Follow-up Appointment:  Yes Clinical Summary of Care: Patient Declined Electronic Signature(s) Signed: 08/04/2019 5:26:58 PM By: Kela Millin Entered By: Kela Millin on 08/04/2019 14:49:40 -------------------------------------------------------------------------------- Lower Extremity Assessment Details Patient Name: Date of Service: [name], [name] 08/04/2019 1:15 PM Medical Record Number: [id number] Patient Account Number: [id number] Date of Birth/Sex: Treating RN: [birth year] (53 y.o. [name]) [name] Primary Care Kynzee Devinney: Precious Haws Other Clinician: Referring Lyrick Worland: Treating Gail Vendetti/Extender: Norina Buzzard, [name] in Treatment: 0 Edema Assessment Assessed: [Left: No] [Right: Yes] Edema: [Left: Ye] [Right: s] Calf Left: Right: Point of Measurement: 31 cm From Medial Instep cm 44 cm Ankle Left: Right: Point of Measurement: 8 cm From Medial Instep cm 25 cm Vascular Assessment Pulses: Dorsalis Pedis Palpable: [Right:Yes] Doppler Audible: [Right:Yes] Posterior Tibial Palpable: [Right:Yes] Doppler Audible: [Right:Yes] Blood Pressure: Brachial: [Right:200] Ankle: [Right:Dorsalis Pedis: 220 1.10] Electronic Signature(s) Signed: 08/04/2019 5:27:53 PM By: Deon Pilling Signed: 08/04/2019 5:28:23 PM By: [name] RN Entered By: Deon Pilling on 08/04/2019 13:46:13 -------------------------------------------------------------------------------- Multi Wound Chart Details Patient Name: Date of Service: [name], [name] 08/04/2019 1:15 PM Medical Record Number: [id number] Patient Account Number: [id number] Date of Birth/Sex: Treating RN: Dec 31, 1965 (53 y.o. [name] Primary Care Grover Robinson: Precious Haws Other Clinician: Referring Terica Yogi: Treating Captain Blucher/Extender: Norina Buzzard, [name] in Treatment: 0 Vital Signs Height(in): 66 Capillary Blood Glucose(mg/dl): 167 Weight(lbs): 256 Pulse(bpm): 98 Body Mass Index(BMI): 37 Blood Pressure(mmHg):  177/99 Temperature(F): 98.5 Respiratory Rate(breaths/min): 18 Photos: [1:No Photos Right, Lateral Lower Leg] [N/A:N/A N/A] Wound Location: [1:Trauma] [N/A:N/A] Wounding Event: [1:Diabetic Wound/Ulcer of the Lower] [N/A:N/A] Primary Etiology: [1:Extremity Lymphedema, Sleep Apnea,] [N/A:N/A] Comorbid History: [1:Arrhythmia, Congestive Heart Failure, Hypertension, Peripheral Arterial Disease, Type II Diabetes 04/10/2019] [N/A:N/A] Date Acquired: [1:0] [N/A:N/A] Weeks of Treatment: [1:Open] [N/A:N/A] Wound Status: [1:1x1.1x0.2] [N/A:N/A] Measurements L x W x D (cm) [1:0.864] [N/A:N/A] A (cm) : rea [1:0.173] [N/A:N/A] Volume (cm) : [1:Grade 2] [N/A:N/A] Classification: [1:Medium] [N/A:N/A] Exudate A mount: [1:Serosanguineous] [N/A:N/A] Exudate Type: [1:red, brown] [N/A:N/A] Exudate Color: [1:Distinct, outline attached] [N/A:N/A] Wound Margin: [1:Medium (34-66%)] [N/A:N/A] Granulation A mount: [1:Red, Pink] [N/A:N/A] Granulation Quality: [1:Medium (34-66%)] [N/A:N/A] Necrotic A mount: [1:Fat Layer (Subcutaneous Tissue)] [N/A:N/A] Exposed Structures: [1:Exposed: Yes Fascia: No Tendon: No Muscle: No Joint: No Bone: No None] [N/A:N/A] Epithelialization: [1:Debridement - Excisional] [N/A:N/A] Debridement: Pre-procedure Verification/Time Out 14:22 [N/A:N/A] Taken: [1:Lidocaine 5% topical ointment] [N/A:N/A]  Pain Control: [1:Subcutaneous, Slough] [N/A:N/A] Tissue Debrided: [1:Skin/Subcutaneous Tissue] [N/A:N/A] Level: [1:1.1] [N/A:N/A] Debridement A (sq cm): [1:rea Curette] [N/A:N/A] Instrument: [1:Moderate] [N/A:N/A] Bleeding: [1:Pressure] [N/A:N/A] Hemostasis A chieved: [1:2] [N/A:N/A] Procedural Pain: [1:0] [N/A:N/A] Post Procedural Pain: [1:Procedure was tolerated well] [N/A:N/A] Debridement Treatment Response: [1:1x1.1x0.2] [N/A:N/A] Post Debridement Measurements L x W x D (cm) [1:0.173] [N/A:N/A] Post Debridement Volume: (cm) [1:Compression Therapy] [N/A:N/A] Procedures  Performed: [1:Debridement] Treatment Notes Wound #1 (Right, Lateral Lower Leg) 1. Cleanse With Wound Cleanser 2. Periwound Care Moisturizing lotion 3. Primary Dressing Applied Iodoflex 4. Secondary Dressing ABD Pad Dry Gauze 6. Support Layer Applied 3 layer compression wrap Electronic Signature(s) Signed: 08/04/2019 5:28:23 PM By: [name] RN Signed: 08/04/2019 5:41:23 PM By: Linton Ham MD Entered By: Linton Ham on 08/04/2019 14:59:20 -------------------------------------------------------------------------------- [place] Details Patient Name: Date of Service: [name], [name] 08/04/2019 1:15 PM Medical Record Number: [id number] Patient Account Number: [id number] Date of Birth/Sex: Treating RN: 1965-09-29 (53 y.o. [name] Primary Care Idaly Verret: Precious Haws Other Clinician: Referring Yanin Muhlestein: Treating Vitalia Stough/Extender: Norina Buzzard, Lemar Lofty in Treatment: 0 Active Inactive Wound/Skin Impairment Nursing Diagnoses: Knowledge deficit related to ulceration/compromised skin integrity Goals: Patient/caregiver will verbalize understanding of skin care regimen Date Initiated: 08/04/2019 Target Resolution Date: 09/04/2019 Goal Status: Active Ulcer/skin breakdown will have a volume reduction of 30% by week 4 Date Initiated: 08/04/2019 Target Resolution Date: 09/04/2019 Goal Status: Active Interventions: Assess patient/caregiver ability to obtain necessary supplies Assess patient/caregiver ability to perform ulcer/skin care regimen upon admission and as needed Assess ulceration(s) every visit Notes: Electronic Signature(s) Signed: 08/04/2019 5:28:23 PM By: [name] RN Entered By: [name] on 08/04/2019 14:22:55 -------------------------------------------------------------------------------- Pain Assessment Details Patient Name: Date of Service: [name], [name] 08/04/2019 1:15 PM Medical Record Number:  [id number] Patient Account Number: [id number] Date of Birth/Sex: Treating RN: 10-22-1965 (53 y.o. [name]) [name] Primary Care Daunte Oestreich: Precious Haws Other Clinician: Referring Leston Schueller: Treating Brittanie Dosanjh/Extender: Norina Buzzard, [name] in Treatment: 0 Active Problems Location of Pain Severity and Description of Pain Patient Has Paino No Site Locations Pain Management and Medication Current Pain Management: Medication: No Cold Application: No Rest: No Massage: No Activity: No T.E.N.S.: No Heat Application: No Leg drop or elevation: No Is the Current Pain Management Adequate: Adequate How does your wound impact your activities of daily livingo Sleep: No Bathing: No Appetite: No Relationship With Others: No Bladder Continence: No Emotions: No Bowel Continence: No Work: No Toileting: No Drive: No Dressing: No Hobbies: No Electronic Signature(s) Signed: 08/04/2019 5:27:53 PM By: Deon Pilling Signed: 08/04/2019 5:28:23 PM By: [name] RN Entered By: Deon Pilling on 08/04/2019 13:46:34 -------------------------------------------------------------------------------- Patient/Caregiver Education Details Patient Name: Date of Service: [name], [name] 4/27/2021andnbsp1:15 PM Medical Record Number: [id number] Patient Account Number: [id number] Date of Birth/Gender: Treating RN: 07-10-1965 (53 y.o. [name] Primary Care Physician: Precious Haws Other Clinician: Referring Physician: Treating Physician/Extender: [name] in Treatment: 0 Education Assessment Education Provided To: Patient Education Topics Provided Wound/Skin Impairment: Methods: Explain/Verbal Responses: State content correctly Electronic Signature(s) Signed: 08/04/2019 5:28:23 PM By: [name] RN Entered By: [name] on 08/04/2019 14:24:13 -------------------------------------------------------------------------------- Wound Assessment Details Patient  Name: Date of Service: [name], [name] 08/04/2019 1:15 PM Medical Record Number: [id number] Patient Account Number: [id number] Date of Birth/Sex: Treating RN: 19-Oct-1965 (53 y.o. [name] Primary Care Anija Brickner: Precious Haws Other Clinician: Referring Lenny Bouchillon: Treating Embry Huss/Extender: Norina Buzzard, [name] in Treatment: 0 Wound Status Wound Number: 1  Primary Diabetic Wound/Ulcer of the Lower Extremity Etiology: Wound Location: Right, Lateral Lower Leg Wound Open Wounding Event: Trauma Status: Date Acquired: 04/10/2019 Comorbid Lymphedema, Sleep Apnea, Arrhythmia, Congestive Heart Failure, Weeks Of Treatment: 0 History: Hypertension, Peripheral Arterial Disease, Type II Diabetes Clustered Wound: No Photos Wound Measurements Length: (cm) 1 Width: (cm) 1.1 Depth: (cm) 0.2 Area: (cm) 0.864 Volume: (cm) 0.173 % Reduction in Area: 0% % Reduction in Volume: 0% Epithelialization: None Tunneling: No Undermining: No Wound Description Classification: Grade 2 Wound Margin: Distinct, outline attached Exudate Amount: Medium Exudate Type: Serosanguineous Exudate Color: red, brown Foul Odor After Cleansing: No Slough/Fibrino Yes Wound Bed Granulation Amount: Medium (34-66%) Exposed Structure Granulation Quality: Red, Pink Fascia Exposed: No Necrotic Amount: Medium (34-66%) Fat Layer (Subcutaneous Tissue) Exposed: Yes Necrotic Quality: Adherent Slough Tendon Exposed: No Muscle Exposed: No Joint Exposed: No Bone Exposed: No Electronic Signature(s) Signed: 08/05/2019 3:47:50 PM By: Mikeal Hawthorne EMT/HBOT Signed: 08/10/2019 5:02:18 PM By: [name] RN Previous Signature: 08/04/2019 5:27:53 PM Version By: Deon Pilling Previous Signature: 08/04/2019 5:28:23 PM Version By: [name] RN Entered By: Mikeal Hawthorne on 08/05/2019 14:22:38 -------------------------------------------------------------------------------- Vitals Details Patient Name: Date of  Service: [name], [name] 08/04/2019 1:15 PM Medical Record Number: [id number] Patient Account Number: [id number] Date of Birth/Sex: Treating RN: [birth year] (53 y.o. Staci Acosta, Morey Hummingbird Primary Care Jarae Panas: Precious Haws Other Clinician: Referring Viyan Rosamond: Treating Kenneth Cuaresma/Extender: Norina Buzzard, [name] in Treatment: 0 Vital Signs Time Taken: 13:17 Temperature (F): 98.5 Height (in): 66 Pulse (bpm): 88 Source: Stated Respiratory Rate (breaths/min): 18 Weight (lbs): 256 Blood Pressure (mmHg): 177/99 Source: Stated Capillary Blood Glucose (mg/dl): 167 Body Mass Index (BMI): 41.3 Reference Range: 80 - 120 mg / dl Electronic Signature(s) Signed: 08/19/2019 9:09:16 AM By: Sandre Kitty Entered By: Sandre Kitty on 08/04/2019 13:18:34

## 2019-08-20 ENCOUNTER — Encounter (HOSPITAL_BASED_OUTPATIENT_CLINIC_OR_DEPARTMENT_OTHER): Payer: Medicare Other | Admitting: Internal Medicine

## 2019-08-20 NOTE — Progress Notes (Signed)
[name] [name] ([id number]) Visit Report for 08/19/2019 Arrival Information Details Patient Name: Date of Service: CO [name], [name] LD 08/19/2019 11:00 A M Medical Record Number: [id number] Patient Account Number: [id number] Date of Birth/Sex: Treating RN: 04/21/1965 (54 y.o. [name] [name] Primary Care Collan Schoenfeld: Precious Haws Other Clinician: Referring [name] [name]: Treating [name] [name]: [name] [name] in Treatment: 2 Visit Information History Since Last Visit Added or deleted any medications: No Patient Arrived: Ambulatory Any new allergies or adverse reactions: No Arrival Time: 10:56 Had a fall or experienced change in No Accompanied By: mother activities of daily living that may affect Transfer Assistance: None risk of falls: Patient Identification Verified: Yes Signs or symptoms of abuse/neglect since last visito No Secondary Verification Process Completed: Yes Hospitalized since last visit: No Patient Requires Transmission-Based Precautions: No Implantable device outside of the clinic excluding No Patient Has Alerts: No cellular tissue based products placed in the center since last visit: Has Dressing in Place as Prescribed: No Has Compression in Place as Prescribed: No Pain Present Now: Yes Notes pt removed wrap Monday secondary to getting it wet Electronic Signature(s) Signed: 08/20/2019 12:51:17 PM By: Baruch Gouty RN, BSN Entered By: Baruch Gouty on 08/19/2019 11:01:21 -------------------------------------------------------------------------------- Compression Therapy Details Patient Name: Date of Service: CO [name], [name] LD 08/19/2019 11:00 A M Medical Record Number: [id number] Patient Account Number: [id number] Date of Birth/Sex: Treating RN: 28-Sep-1965 (54 y.o. [name] [name] Primary Care Clester Chlebowski: Precious Haws Other Clinician: Referring Jaquavian Firkus: Treating Kaley Jutras/Extender: [name] [name] in Treatment: 2 Compression  Therapy Performed for Wound Assessment: Wound #1 Right,Lateral Lower Leg Performed By: Clinician Baruch Gouty, RN Compression Type: Three Hydrologist) Signed: 08/20/2019 12:51:17 PM By: Baruch Gouty RN, BSN Entered By: Baruch Gouty on 08/19/2019 11:06:41 -------------------------------------------------------------------------------- Encounter Discharge Information Details Patient Name: Date of Service: CO [name], [name] LD 08/19/2019 11:00 A M Medical Record Number: [id number] Patient Account Number: [id number] Date of Birth/Sex: Treating RN: 1965/09/01 (54 y.o. [name] [name] Primary Care Rachna Schonberger: Precious Haws Other Clinician: Referring Delmi Fulfer: Treating Braelen Sproule/Extender: Katheran Awe [name] in Treatment: 2 Encounter Discharge Information Items Discharge Condition: Stable Ambulatory Status: Ambulatory Discharge Destination: Home Transportation: Private Auto Accompanied By: self Schedule Follow-up Appointment: Yes Clinical Summary of Care: Patient Declined Notes BP 207/107 at discharge - instructed pt to follow up with his PCP to recheck his BP this afternoon or tomorrow. Electronic Signature(s) Signed: 08/20/2019 12:51:17 PM By: Baruch Gouty RN, BSN Entered By: Baruch Gouty on 08/19/2019 11:24:57 -------------------------------------------------------------------------------- Patient/Caregiver Education Details Patient Name: Date of Service: CO [name], [name] LD 5/12/2021andnbsp11:00 A M Medical Record Number: [id number] Patient Account Number: [id number] Date of Birth/Gender: Treating RN: June 14, 1965 (54 y.o. [name] [name] Primary Care Physician: Precious Haws Other Clinician: Referring Physician: Treating Physician/Extender: Burnard Hawthorne in Treatment: 2 Education Assessment Education Provided To: Patient Education Topics Provided Pain: Methods: Explain/Verbal Responses: Reinforcements needed, State  content correctly Venous: Methods: Explain/Verbal Responses: Reinforcements needed, State content correctly Wound/Skin Impairment: Methods: Explain/Verbal Responses: Reinforcements needed, State content correctly Electronic Signature(s) Signed: 08/20/2019 12:51:17 PM By: Baruch Gouty RN, BSN Entered By: Baruch Gouty on 08/19/2019 11:07:50 -------------------------------------------------------------------------------- Wound Assessment Details Patient Name: Date of Service: CO [name], [name] LD 08/19/2019 11:00 A M Medical Record Number: [id number] Patient Account Number: [id number] Date of Birth/Sex: Treating RN: 08-Nov-1965 (54 y.o. [name] [name] Primary Care Caryn Gienger: Precious Haws Other Clinician: Referring Aurea Aronov: Treating Raiden Yearwood/Extender: [name] [name] in Treatment: 2 Wound Status Wound  Number: 1 Primary Diabetic Wound/Ulcer of the Lower Extremity Etiology: Wound Location: Right, Lateral Lower Leg Wound Open Wounding Event: Trauma Status: Date Acquired: 04/10/2019 Comorbid Lymphedema, Sleep Apnea, Arrhythmia, Congestive Heart Failure, [name] Of Treatment: 2 History: Hypertension, Peripheral Arterial Disease, Type II Diabetes Clustered Wound: No Wound Measurements Length: (cm) 1.3 Width: (cm) 1.2 Depth: (cm) 0.2 Area: (cm) 1.225 Volume: (cm) 0.245 % Reduction in Area: -41.8% % Reduction in Volume: -41.6% Epithelialization: None Tunneling: No Undermining: No Wound Description Classification: Grade 2 Wound Margin: Distinct, outline attached Exudate Amount: Small Exudate Type: Serosanguineous Exudate Color: red, brown Foul Odor After Cleansing: No Slough/Fibrino Yes Wound Bed Granulation Amount: Large (67-100%) Exposed Structure Granulation Quality: Red Fascia Exposed: No Necrotic Amount: Small (1-33%) Fat Layer (Subcutaneous Tissue) Exposed: Yes Necrotic Quality: Eschar, Adherent Slough Tendon Exposed: No Muscle Exposed:  No Joint Exposed: No Bone Exposed: No Treatment Notes Wound #1 (Right, Lateral Lower Leg) 1. Cleanse With Wound Cleanser 2. Periwound Care Moisturizing lotion 3. Primary Dressing Applied Iodoflex 4. Secondary Dressing Dry Gauze 6. Support Layer Applied 3 layer compression Water quality scientist) Signed: 08/20/2019 12:51:17 PM By: Baruch Gouty RN, BSN Entered By: Baruch Gouty on 08/19/2019 11:06:20 -------------------------------------------------------------------------------- [place] Details Patient Name: Date of Service: CO [name], [name] LD 08/19/2019 11:00 A M Medical Record Number: [id number] Patient Account Number: [id number] Date of Birth/Sex: Treating RN: 03/16/1966 (54 y.o. [name] [name] Primary Care Ladale Sherburn: Precious Haws Other Clinician: Referring Diaz Crago: Treating Joyceline Maiorino/Extender: [name] [name] in Treatment: 2 Vital Signs Time Taken: 11:01 Temperature (F): 97.9 Height (in): 66 Pulse (bpm): 89 Source: Stated Respiratory Rate (breaths/min): 18 Weight (lbs): 256 Blood Pressure (mmHg): 201/112 Source: Stated Reference Range: 80 - 120 mg / dl Body Mass Index (BMI): 41.3 Notes BP left arm 205/125 Electronic Signature(s) Signed: 08/20/2019 12:51:17 PM By: Baruch Gouty RN, BSN Entered By: Baruch Gouty on 08/19/2019 11:08:32

## 2019-08-22 ENCOUNTER — Other Ambulatory Visit: Payer: Self-pay

## 2019-08-22 ENCOUNTER — Encounter (HOSPITAL_BASED_OUTPATIENT_CLINIC_OR_DEPARTMENT_OTHER): Payer: Medicare Other | Admitting: Internal Medicine

## 2019-08-22 DIAGNOSIS — E11622 Type 2 diabetes mellitus with other skin ulcer: Secondary | ICD-10-CM | POA: Diagnosis not present

## 2019-08-25 NOTE — Progress Notes (Signed)
[name], [name] ([id number]) Visit Report for 08/22/2019 Debridement Details Patient Name: Date of Service: [name], [name] 08/22/2019 8:15 A M Medical Record Number: [id number] Patient Account Number: [id number] Date of Birth/Sex: Treating RN: 1966-04-06 (54 y.o. [name] Primary Care Provider: Precious Haws Other Clinician: Referring Provider: Treating Provider/Extender: [name] in Treatment: 2 Debridement Performed for Assessment: Wound #1 Right,Lateral Lower Leg Performed By: Physician Ricard Dillon., MD Debridement Type: Debridement Severity of Tissue Pre Debridement: Fat layer exposed Level of Consciousness (Pre-procedure): Awake and Alert Pre-procedure Verification/Time Out Yes - 08:55 Taken: Start Time: 08:56 Pain Control: Other : benzocaine, 20% T Area Debrided (L x W): otal 1.4 (cm) x 1.4 (cm) = 1.96 (cm) Tissue and other material debrided: Viable, Non-Viable, Slough, Subcutaneous, Slough Level: Skin/Subcutaneous Tissue Debridement Description: Excisional Instrument: Curette Bleeding: Minimum Hemostasis Achieved: Pressure End Time: 08:59 Procedural Pain: 8 Post Procedural Pain: 4 Response to Treatment: Procedure was tolerated well Level of Consciousness (Post- Awake and Alert procedure): Post Debridement Measurements of Total Wound Length: (cm) 1.4 Width: (cm) 1.4 Depth: (cm) 0.7 Volume: (cm) 1.078 Character of Wound/Ulcer Post Debridement: Improved Severity of Tissue Post Debridement: Fat layer exposed Post Procedure Diagnosis Same as Pre-procedure Electronic Signature(s) Signed: 08/22/2019 12:12:28 PM By: Linton Ham MD Signed: 08/25/2019 5:27:28 PM By: Baruch Gouty RN, BSN Entered By: Linton Ham on 08/22/2019 09:05:39 -------------------------------------------------------------------------------- HPI Details Patient Name: Date of Service: [name], [name] 08/22/2019 8:15 A M Medical Record Number:  [id number] Patient Account Number: [id number] Date of Birth/Sex: Treating RN: 1966-03-09 (54 y.o. [name] Primary Care Provider: Precious Haws Other Clinician: Referring Provider: Treating Provider/Extender: Norina Buzzard, Lemar Lofty in Treatment: 2 History of Present Illness HPI Description: ADMISSION 08/04/2019 This is a 54 year old man who traumatized his right lateral lower leg while going [place] an escalator at the mall sometime in December 2020. He was referred to vascular surgery and on 04/14/2019 he saw Dr. Wenda Overland. He was felt to have "palpable pulses and normal noninvasive studies" although I cannot see these results. He was back in his primary doctor's office on 07/30/2019 for a nonhealing right lower extremity wound. He was referred to wound care I think in [facility] but his insurance was out of network. A CNS was ordered. X-ray was done that was negative. He has been using wound cleanser and a Band-Aid. Past medical history is actually quite extensive and includes chronic kidney disease stage IV, chronic diastolic heart failure, atrial fibrillation, poorly controlled hypertension, poorly controlled diabetes with peripheral neuropathy and a recent hemoglobin A1c of 11.5, depression, lymphedema and sleep apnea ABI in our clinic was 1.1 in the right 5/6; this wound was initially traumatized while the patient was going down on an escalator. Arrived in clinic last week with a completely nonviable surface. He required mechanical debridement and we have been using Iodoflex under compression. Apparently the compression wraps fell down. He also has poorly controlled diabetes 5/15; traumatic/contusion wound to the right lateral lower leg. We have been using Iodoflex to clean at the surface of this which was completely nonviable along with mechanical debridements. He is under compression. Electronic Signature(s) Signed: 08/22/2019 12:12:28 PM By: Linton Ham MD Entered By:  Linton Ham on 08/22/2019 09:06:15 -------------------------------------------------------------------------------- Physical Exam Details Patient Name: Date of Service: [name], [name] 08/22/2019 8:15 A M Medical Record Number: [id number] Patient Account Number: [id number] Date of Birth/Sex: Treating RN: 04-21-1965 (54 y.o. [name] Primary Care Provider: Precious Haws  Other Clinician: Referring Provider: Treating Provider/Extender: Norina Buzzard, Lemar Lofty in Treatment: 2 Constitutional Patient is hypertensive. His blood pressure runs 190s over 110s at home I told him this is far too high. Pulse regular and within target range for patient.Marland Kitchen Respirations regular, non-labored and within target range.. Temperature is normal and within the target range for the patient.. He does not look unwell. Cardiovascular Pedal pulses are robust. Notes Wound exam; right lateral calf just proximal to the lateral malleolus. Punched out wound much less necrotic debris on the surface and around the circumference however #3 did curette to clean the surface and some eschar and subcutaneous tissue from the Scott circumference hemostasis with direct pressure. No evidence of surrounding infection Electronic Signature(s) Signed: 08/22/2019 12:12:28 PM By: Linton Ham MD Entered By: Linton Ham on 08/22/2019 09:07:26 -------------------------------------------------------------------------------- Physician Orders Details Patient Name: Date of Service: [name], [name] 08/22/2019 8:15 A M Medical Record Number: [id number] Patient Account Number: [id number] Date of Birth/Sex: Treating RN: 18-Oct-1965 (54 y.o. [name] Primary Care Provider: Precious Haws Other Clinician: Referring Provider: Treating Provider/Extender: [name] in Treatment: 2 Verbal / Phone Orders: No Diagnosis Coding ICD-10 Coding Code Description S80.811D Abrasion, right lower leg,  subsequent encounter L97.812 Non-pressure chronic ulcer of other part of right lower leg with fat layer exposed E11.622 Type 2 diabetes mellitus with other skin ulcer I89.0 Lymphedema, not elsewhere classified Follow-up Appointments ppointment in 2 weeks. - MD with Dr. Miki Kins or Firday Return A Nurse Visit: - 1 week Dressing Change Frequency Do not change entire dressing for one week. Skin Barriers/Peri-Wound Care Moisturizing lotion - mixed with TCA to leg TCA Cream or Ointment Wound Cleansing May shower with protection. Primary Wound Dressing Wound #1 Right,Lateral Lower Leg Silver Collagen - moisten with hydrogel Secondary Dressing Wound #1 Right,Lateral Lower Leg Dry Gauze Drawtex - cut to fit inside wound edges Edema Control Wound #1 Right,Lateral Lower Leg 3 Layer Compression System - Right Lower Extremity - apply unna boot first layer to upper portion of lower leg to aid in securing the compression wrap. use kerlix instead of cotton for contact layer Avoid standing for long periods of time Elevate legs to the level of the heart or above for 30 minutes daily and/or when sitting, a frequency of: - throughout the day. Exercise regularly Patient Medications llergies: No Known Drug Allergies A Notifications Medication Indication Start End prior to debridement 08/22/2019 benzocaine DOSE topical 20 % aerosol - aerosol topical Electronic Signature(s) Signed: 08/22/2019 12:12:28 PM By: Linton Ham MD Signed: 08/25/2019 5:27:28 PM By: Baruch Gouty RN, BSN Entered By: Baruch Gouty on 08/22/2019 09:06:00 -------------------------------------------------------------------------------- Problem List Details Patient Name: Date of Service: [name], [name] 08/22/2019 8:15 A M Medical Record Number: [id number] Patient Account Number: [id number] Date of Birth/Sex: Treating RN: Aug 09, 1965 (54 y.o. [name] Primary Care Provider: Precious Haws Other  Clinician: Referring Provider: Treating Provider/Extender: Norina Buzzard, Lemar Lofty in Treatment: 2 Active Problems ICD-10 Encounter Code Description Active Date MDM Diagnosis S80.811D Abrasion, right lower leg, subsequent encounter 08/04/2019 No Yes L97.812 Non-pressure chronic ulcer of other part of right lower leg with fat layer 08/04/2019 No Yes exposed E11.622 Type 2 diabetes mellitus with other skin ulcer 08/04/2019 No Yes I89.0 Lymphedema, not elsewhere classified 08/04/2019 No Yes Inactive Problems Resolved Problems Electronic Signature(s) Signed: 08/22/2019 12:12:28 PM By: Linton Ham MD Entered By: Linton Ham on 08/22/2019 09:05:26 -------------------------------------------------------------------------------- Progress Note Details Patient Name: Date of Service: CO  RD, [name] 08/22/2019 8:15 A M Medical Record Number: [id number] Patient Account Number: [id number] Date of Birth/Sex: Treating RN: [birth year] (54 y.o. [name] Primary Care Provider: Precious Haws Other Clinician: Referring Provider: Treating Provider/Extender: Norina Buzzard, Lemar Lofty in Treatment: 2 Subjective History of Present Illness (HPI) ADMISSION 08/04/2019 This is a 54 year old man who traumatized his right lateral lower leg while going [place] an escalator at the mall sometime in December 2020. He was referred to vascular surgery and on 04/14/2019 he saw Dr. Wenda Overland. He was felt to have "palpable pulses and normal noninvasive studies" although I cannot see these results. He was back in his primary doctor's office on 07/30/2019 for a nonhealing right lower extremity wound. He was referred to wound care I think in [facility] but his insurance was out of network. A CNS was ordered. X-ray was done that was negative. He has been using wound cleanser and a Band-Aid. Past medical history is actually quite extensive and includes chronic kidney disease stage IV, chronic diastolic  heart failure, atrial fibrillation, poorly controlled hypertension, poorly controlled diabetes with peripheral neuropathy and a recent hemoglobin A1c of 11.5, depression, lymphedema and sleep apnea ABI in our clinic was 1.1 in the right 5/6; this wound was initially traumatized while the patient was going down on an escalator. Arrived in clinic last week with a completely nonviable surface. He required mechanical debridement and we have been using Iodoflex under compression. Apparently the compression wraps fell down. He also has poorly controlled diabetes 5/15; traumatic/contusion wound to the right lateral lower leg. We have been using Iodoflex to clean at the surface of this which was completely nonviable along with mechanical debridements. He is under compression. Objective Constitutional Patient is hypertensive. His blood pressure runs 190s over 110s at home I told him this is far too high. Pulse regular and within target range for patient.Marland Kitchen Respirations regular, non-labored and within target range.. Temperature is normal and within the target range for the patient.. He does not look unwell. Vitals Time Taken: 8:18 AM, Height: 66 in, Weight: 256 lbs, BMI: 41.3, Temperature: 97.8 F, Pulse: 83 bpm, Respiratory Rate: 18 breaths/min, Blood Pressure: 210/120 mmHg. General Notes: Patient stated he feels fine, denies headache/dizziness. MD notified Cardiovascular Pedal pulses are robust. General Notes: Wound exam; right lateral calf just proximal to the lateral malleolus. Punched out wound much less necrotic debris on the surface and around the circumference however #3 did curette to clean the surface and some eschar and subcutaneous tissue from the Scott circumference hemostasis with direct pressure. No evidence of surrounding infection Integumentary (Hair, Skin) Wound #1 status is Open. Original cause of wound was Trauma. The wound is located on the Right,Lateral Lower Leg. The wound measures  1.4cm length x 1.4cm width x 0.7cm depth; 1.539cm^2 area and 1.078cm^3 volume. There is Fat Layer (Subcutaneous Tissue) Exposed exposed. There is no tunneling or undermining noted. There is a small amount of serosanguineous drainage noted. The wound margin is distinct with the outline attached to the wound base. There is medium (34-66%) red granulation within the wound bed. There is a medium (34-66%) amount of necrotic tissue within the wound bed including Eschar and Adherent Slough. 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 There was a Excisional Skin/Subcutaneous Tissue Debridement with a total area of 1.96 sq cm performed by Ricard Dillon., MD. With the following instrument(s): Curette to remove Viable and Non-Viable tissue/material. Material removed includes Subcutaneous Tissue and Slough and after achieving pain control using Other (benzocaine, 20%). No specimens were taken. A time out was conducted at 08:55, prior to the start of the procedure. A Minimum amount of bleeding was controlled with Pressure. The procedure was tolerated well with a pain level of 8 throughout and a pain level of 4 following the procedure. Post Debridement Measurements: 1.4cm length x 1.4cm width x 0.7cm depth; 1.078cm^3 volume. Character of Wound/Ulcer Post Debridement is improved. Severity of Tissue Post Debridement is: Fat layer exposed. Post procedure Diagnosis Wound #1: Same as Pre-Procedure Pre-procedure diagnosis of Wound #1 is a Diabetic Wound/Ulcer of the Lower Extremity located on the Right,Lateral  Lower Leg . There was a Three Layer Compression Therapy Procedure by Baruch Gouty, RN. Post procedure Diagnosis Wound #1: Same as Pre-Procedure Plan Follow-up Appointments: Return Appointment in 2 weeks. - MD with Dr. Miki Kins or Firday Nurse Visit: - 1 week Dressing Change Frequency: Do not change entire dressing for one week. Skin Barriers/Peri-Wound Care: Moisturizing lotion - mixed with TCA to leg TCA Cream or Ointment Wound Cleansing: May shower with protection. Primary Wound Dressing: Wound #1 Right,Lateral Lower Leg: Silver Collagen - moisten with hydrogel Secondary Dressing: Wound #1 Right,Lateral Lower Leg: Dry Gauze Drawtex - cut to fit inside wound edges Edema Control: Wound #1 Right,Lateral Lower Leg: 3 Layer Compression System - Right Lower Extremity - apply unna boot first layer to upper portion of lower leg to aid in securing the compression wrap. use kerlix instead of cotton for contact layer Avoid standing for long periods of time Elevate legs to the level of the heart or above for 30 minutes daily and/or when sitting, a frequency of: - throughout the day. Exercise regularly The following medication(s) was prescribed: benzocaine topical 20 % aerosol aerosol topical for prior to debridement was prescribed at facility 1. I change the primary dressing to Prisma hydrogel with backing drawtex under compression 2. Hopefully to stimulate some granulation 3. I explained the patient his blood pressure is very high high even at home. He is on clonidine and what sounds like hydrochlorothiazide. The clonidine is a TTS. He may need this increase but is no doubt going to require a third medication 4. I made him promise to call his primary doctor on Monday to arrange blood pressure follow-up Electronic Signature(s) Signed: 08/22/2019 12:12:28 PM By: Linton Ham MD Entered By: Linton Ham on 08/22/2019  09:08:57 -------------------------------------------------------------------------------- SuperBill Details Patient Name: Date of Service: [name], [name] 08/22/2019 Medical Record Number: [id number] Patient Account Number: [id number] Date of Birth/Sex: Treating RN: 1965/12/20 (54 y.o. [name] Primary Care Provider: Precious Haws Other Clinician: Referring Provider: Treating Provider/Extender: Norina Buzzard, Lemar Lofty in Treatment: 2 Diagnosis Coding ICD-10 Codes Code Description [phone] Abrasion, right lower leg, subsequent encounter L97.812 Non-pressure chronic ulcer of other part of right lower leg with fat layer exposed E11.622 Type 2 diabetes mellitus with other skin ulcer I89.0 Lymphedema, not elsewhere classified Facility Procedures CPT4 Code: [id number] Description: 60737 - DEB SUBQ TISSUE 20 SQ CM/< ICD-10 Diagnosis Description L97.812 Non-pressure chronic ulcer of other part of right lower leg with fat layer exp Modifier: osed Quantity: 1 Physician Procedures :  CPT4 Code Description Modifier [id number] - WC PHYS SUBQ TISS 20 SQ CM ICD-10 Diagnosis Description H40.352 Non-pressure chronic ulcer of other part of right lower leg with fat layer exposed Quantity: 1 Electronic Signature(s) Signed: 08/22/2019 12:12:28 PM By: Linton Ham MD Entered By: Linton Ham on 08/22/2019 [place]

## 2019-08-25 NOTE — Progress Notes (Signed)
[name] ([id number]) Visit Report for 08/22/2019 Arrival Information Details Patient Name: Date of Service: [name], [name] 08/22/2019 8:15 A M Medical Record Number: [id number] Patient Account Number: [id number] Date of Birth/Sex: Treating RN: 1965-05-17 (54 y.o. [name] Primary Care Genoveva Singleton: Precious Haws Other Clinician: Referring Sol Odor: Treating Giavana Rooke/Extender: [name] in Treatment: 2 Visit Information History Since Last Visit Added or deleted any medications: No Patient Arrived: Ambulatory Any new allergies or adverse reactions: No Arrival Time: 08:18 Had a fall or experienced change in No Accompanied By: self activities of daily living that may affect Transfer Assistance: None risk of falls: Patient Identification Verified: Yes Signs or symptoms of abuse/neglect since last visito No Secondary Verification Process Completed: Yes Hospitalized since last visit: No Patient Requires Transmission-Based Precautions: No Implantable device outside of the clinic excluding No Patient Has Alerts: No cellular tissue based products placed in the center since last visit: Has Dressing in Place as Prescribed: Yes Has Compression in Place as Prescribed: Yes Pain Present Now: No Electronic Signature(s) Signed: 08/24/2019 5:05:18 PM By: [name] Entered By: [name] on 08/22/2019 08:21:21 -------------------------------------------------------------------------------- Compression Therapy Details Patient Name: Date of Service: [name], [name] 08/22/2019 8:15 A M Medical Record Number: [id number] Patient Account Number: [id number] Date of Birth/Sex: Treating RN: 1966/01/20 (54 y.o. [name] Primary Care Jerni Selmer: Precious Haws Other Clinician: Referring Sharayah Renfrow: Treating Shun Pletz/Extender: [name] in Treatment: 2 Compression Therapy Performed for Wound Assessment: Wound #1 Right,Lateral  Lower Leg Performed By: Clinician Baruch Gouty, RN Compression Type: Three Layer Post Procedure Diagnosis Same as Pre-procedure Electronic Signature(s) Signed: 08/25/2019 5:27:28 PM By: Baruch Gouty RN, BSN Entered By: Baruch Gouty on 08/22/2019 08:57:31 -------------------------------------------------------------------------------- Encounter Discharge Information Details Patient Name: Date of Service: [name], [name] 08/22/2019 8:15 A M Medical Record Number: [id number] Patient Account Number: [id number] Date of Birth/Sex: Treating RN: 12/29/1965 (54 y.o. [name] Primary Care Bladen Umar: Precious Haws Other Clinician: Referring Zeniah Briney: Treating Shandel Busic/Extender: [name] in Treatment: 2 Encounter Discharge Information Items Post Procedure Vitals Discharge Condition: Stable Temperature (F): 97.8 Ambulatory Status: Ambulatory Pulse (bpm): 83 Discharge Destination: Home Respiratory Rate (breaths/min): 18 Transportation: Private Auto Blood Pressure (mmHg): 210/120 Accompanied By: alone Schedule Follow-up Appointment: Yes Clinical Summary of Care: Patient Declined Electronic Signature(s) Signed: 08/24/2019 5:54:52 PM By: Levan Hurst RN, BSN Entered By: Levan Hurst on 08/22/2019 09:09:17 -------------------------------------------------------------------------------- Lower Extremity Assessment Details Patient Name: Date of Service: [name], [name] 08/22/2019 8:15 A M Medical Record Number: [id number] Patient Account Number: [id number] Date of Birth/Sex: Treating RN: [birth year] (54 y.o. [name] Primary Care Jeremi Losito: Precious Haws Other Clinician: Referring Kirkland Figg: Treating Keoni Risinger/Extender: Norina Buzzard, Lynelle Smoke Weeks in Treatment: 2 Edema Assessment Assessed: [Left: No] [Right: No] Edema: [Left: Ye] [Right: s] Calf Left: Right: Point of Measurement: 31 cm From Medial Instep cm 43.5 cm Ankle Left:  Right: Point of Measurement: 8 cm From Medial Instep cm 25 cm Vascular Assessment Pulses: Dorsalis Pedis Palpable: [Right:Yes] Electronic Signature(s) Signed: 08/24/2019 5:05:18 PM By: [name] Entered By: [name] on 08/22/2019 08:25:29 -------------------------------------------------------------------------------- Multi Wound Chart Details Patient Name: Date of Service: [name], [name] 08/22/2019 8:15 A M Medical Record Number: [id number] Patient Account Number: [id number] Date of Birth/Sex: Treating RN: 1966-04-06 (54 y.o. [name] Primary Care Jiro Kiester: Precious Haws Other Clinician: Referring Albirta Rhinehart: Treating Oluwaseyi Raffel/Extender: Norina Buzzard, Lynelle Smoke Weeks in Treatment: 2 Vital Signs Height(in): 66 Pulse(bpm): 83 Weight(lbs): 256  Blood Pressure(mmHg): 210/120 Body Mass Index(BMI): 41 Temperature(F): 97.8 Respiratory Rate(breaths/min): 18 Photos: [1:No Photos Right, Lateral Lower Leg] [N/A:N/A N/A] Wound Location: [1:Trauma] [N/A:N/A] Wounding Event: [1:Diabetic Wound/Ulcer of the Lower] [N/A:N/A] Primary Etiology: [1:Extremity Lymphedema, Sleep Apnea,] [N/A:N/A] Comorbid History: [1:Arrhythmia, Congestive Heart Failure, Hypertension, Peripheral Arterial Disease, Type II Diabetes 04/10/2019] [N/A:N/A] Date Acquired: [1:2] [N/A:N/A] Weeks of Treatment: [1:Open] [N/A:N/A] Wound Status: [1:1.4x1.4x0.7] [N/A:N/A] Measurements L x W x D (cm) [1:1.539] [N/A:N/A] A (cm) : rea [1:1.078] [N/A:N/A] Volume (cm) : [1:-78.10%] [N/A:N/A] % Reduction in A [1:rea: -523.10%] [N/A:N/A] % Reduction in Volume: [1:Grade 2] [N/A:N/A] Classification: [1:Small] [N/A:N/A] Exudate A mount: [1:Serosanguineous] [N/A:N/A] Exudate Type: [1:red, brown] [N/A:N/A] Exudate Color: [1:Distinct, outline attached] [N/A:N/A] Wound Margin: [1:Medium (34-66%)] [N/A:N/A] Granulation A mount: [1:Red] [N/A:N/A] Granulation Quality: [1:Medium (34-66%)] [N/A:N/A] Necrotic A  mount: [1:Eschar, Adherent Slough] [N/A:N/A] Necrotic Tissue: [1:Fat Layer (Subcutaneous Tissue)] [N/A:N/A] Exposed Structures: [1:Exposed: Yes Fascia: No Tendon: No Muscle: No Joint: No Bone: No None] [N/A:N/A] Epithelialization: [1:Debridement - Excisional] [N/A:N/A] Debridement: Pre-procedure Verification/Time Out 08:55 [N/A:N/A] Taken: [1:Other] [N/A:N/A] Pain Control: [1:Subcutaneous, Slough] [N/A:N/A] Tissue Debrided: [1:Skin/Subcutaneous Tissue] [N/A:N/A] Level: [1:1.96] [N/A:N/A] Debridement A (sq cm): [1:rea Curette] [N/A:N/A] Instrument: [1:Minimum] [N/A:N/A] Bleeding: [1:Pressure] [N/A:N/A] Hemostasis A chieved: [1:8] [N/A:N/A] Procedural Pain: [1:4] [N/A:N/A] Post Procedural Pain: [1:Procedure was tolerated well] [N/A:N/A] Debridement Treatment Response: [1:1.4x1.4x0.7] [N/A:N/A] Post Debridement Measurements L x W x D (cm) [1:1.078] [N/A:N/A] Post Debridement Volume: (cm) [1:Compression Therapy] [N/A:N/A] Procedures Performed: [1:Debridement] Treatment Notes Electronic Signature(s) Signed: 08/22/2019 12:12:28 PM By: Linton Ham MD Signed: 08/25/2019 5:27:28 PM By: Baruch Gouty RN, BSN Entered By: Linton Ham on 08/22/2019 09:05:32 -------------------------------------------------------------------------------- Multi-Disciplinary Care Plan Details Patient Name: Date of Service: [name], [name] 08/22/2019 8:15 A M Medical Record Number: [id number] Patient Account Number: [id number] Date of Birth/Sex: Treating RN: Mar 29, 1966 (54 y.o. [name] Primary Care Dwon Sky: Precious Haws Other Clinician: Referring Kairi Tufo: Treating Elson Ulbrich/Extender: Norina Buzzard, Lemar Lofty in Treatment: 2 Active Inactive Wound/Skin Impairment Nursing Diagnoses: Knowledge deficit related to ulceration/compromised skin integrity Goals: Patient/caregiver will verbalize understanding of skin care regimen Date Initiated: 08/04/2019 Target Resolution Date:  09/04/2019 Goal Status: Active Ulcer/skin breakdown will have a volume reduction of 30% by week 4 Date Initiated: 08/04/2019 Target Resolution Date: 09/04/2019 Goal Status: Active Interventions: Assess patient/caregiver ability to obtain necessary supplies Assess patient/caregiver ability to perform ulcer/skin care regimen upon admission and as needed Assess ulceration(s) every visit Notes: Electronic Signature(s) Signed: 08/25/2019 5:27:28 PM By: Baruch Gouty RN, BSN Entered By: Baruch Gouty on 08/22/2019 08:28:53 -------------------------------------------------------------------------------- Pain Assessment Details Patient Name: Date of Service: [name], [name] 08/22/2019 8:15 A M Medical Record Number: [id number] Patient Account Number: [id number] Date of Birth/Sex: Treating RN: 07-01-1965 (54 y.o. [name] Primary Care Shamiah Kahler: Precious Haws Other Clinician: Referring Dejion Grillo: Treating Dannielle Baskins/Extender: Norina Buzzard, Lemar Lofty in Treatment: 2 Active Problems Location of Pain Severity and Description of Pain Patient Has Paino No Site Locations Pain Management and Medication Current Pain Management: Electronic Signature(s) Signed: 08/24/2019 5:05:18 PM By: [name] Entered By: [name] on 08/22/2019 08:21:27 -------------------------------------------------------------------------------- Patient/Caregiver Education Details Patient Name: Date of Service: [name], [name] 5/15/2021andnbsp8:15 A M Medical Record Number: [id number] Patient Account Number: [id number] Date of Birth/Gender: Treating RN: [birth year] (54 y.o. [name] Primary Care Physician: Precious Haws Other Clinician: Referring Physician: Treating Physician/Extender: [name] in Treatment: 2 Education Assessment Education Provided To: Patient Education Topics Provided Venous: Methods: Explain/Verbal Responses: Reinforcements  needed, State content correctly  Electronic Signature(s) Signed: 08/25/2019 5:27:28 PM By: Baruch Gouty RN, BSN Entered By: Baruch Gouty on 08/22/2019 08:29:12 -------------------------------------------------------------------------------- Wound Assessment Details Patient Name: Date of Service: [name], [name] 08/22/2019 8:15 A M Medical Record Number: [id number] Patient Account Number: [id number] Date of Birth/Sex: Treating RN: 12-10-1965 (54 y.o. [name] Primary Care Starling Jessie: Precious Haws Other Clinician: Referring Saron Vanorman: Treating Rasean Joos/Extender: Norina Buzzard, Lynelle Smoke Weeks in Treatment: 2 Wound Status Wound Number: 1 Primary Diabetic Wound/Ulcer of the Lower Extremity Etiology: Wound Location: Right, Lateral Lower Leg Wound Open Wounding Event: Trauma Status: Date Acquired: 04/10/2019 Comorbid Lymphedema, Sleep Apnea, Arrhythmia, Congestive Heart Failure, Weeks Of Treatment: 2 History: Hypertension, Peripheral Arterial Disease, Type II Diabetes Clustered Wound: No Photos Photo Uploaded By: Mikeal Hawthorne on 08/24/2019 11:20:58 Wound Measurements Length: (cm) 1.4 Width: (cm) 1.4 Depth: (cm) 0.7 Area: (cm) 1.539 Volume: (cm) 1.078 % Reduction in Area: -78.1% % Reduction in Volume: -523.1% Epithelialization: None Tunneling: No Undermining: No Wound Description Classification: Grade 2 Wound Margin: Distinct, outline attached Exudate Amount: Small Exudate Type: Serosanguineous Exudate Color: red, brown Foul Odor After Cleansing: No Slough/Fibrino Yes Wound Bed Granulation Amount: Medium (34-66%) Exposed Structure Granulation Quality: Red Fascia Exposed: No Necrotic Amount: Medium (34-66%) Fat Layer (Subcutaneous Tissue) Exposed: Yes Necrotic Quality: Eschar, Adherent Slough Tendon Exposed: No Muscle Exposed: No Joint Exposed: No Bone Exposed: No Treatment Notes Wound #1 (Right, Lateral Lower Leg) 1. Cleanse With Soap and  water 2. Periwound Care Moisturizing lotion TCA Cream 3. Primary Dressing Applied Collegen AG 4. Secondary Dressing Dry Gauze Drawtex 6. Support Layer Applied 3 layer compression wrap Electronic Signature(s) Signed: 08/24/2019 5:05:18 PM By: [name] Entered By: [name] on 08/22/2019 08:31:25 -------------------------------------------------------------------------------- Vitals Details Patient Name: Date of Service: [name], [name] 08/22/2019 8:15 A M Medical Record Number: [id number] Patient Account Number: [id number] Date of Birth/Sex: Treating RN: Jun 06, 1965 (54 y.o. [name] Primary Care Mattingly Fountaine: Precious Haws Other Clinician: Referring Kamyiah Colantonio: Treating Larua Collier/Extender: Norina Buzzard, Lemar Lofty in Treatment: 2 Vital Signs Time Taken: 08:18 Temperature (F): 97.8 Height (in): 66 Pulse (bpm): 83 Weight (lbs): 256 Respiratory Rate (breaths/min): 18 Body Mass Index (BMI): 41.3 Blood Pressure (mmHg): 210/120 Reference Range: 80 - 120 mg / dl Notes Patient stated he feels fine, denies headache/dizziness. MD notified Electronic Signature(s) Signed: 08/24/2019 5:05:18 PM By: [name] Entered By: [name] on 08/22/2019 08:40:11

## 2019-08-26 ENCOUNTER — Encounter (HOSPITAL_BASED_OUTPATIENT_CLINIC_OR_DEPARTMENT_OTHER): Payer: Medicare Other | Admitting: Physician Assistant

## 2019-08-26 ENCOUNTER — Other Ambulatory Visit: Payer: Self-pay

## 2019-08-26 DIAGNOSIS — E11622 Type 2 diabetes mellitus with other skin ulcer: Secondary | ICD-10-CM | POA: Diagnosis not present

## 2019-08-26 NOTE — Progress Notes (Signed)
Monnig, KLINE ([id number]) Visit Report for 08/26/2019 Arrival Information Details Patient Name: Date of Service: [name], [name] 08/26/2019 8:15 A M Medical Record Number: [id number] Patient Account Number: [id number] Date of Birth/Sex: Treating RN: July 29, 1965 (54 y.o. [name] Primary Care Setsuko Robins: Precious Haws Other Clinician: Referring Katey Barrie: Treating Dalphine Cowie/Extender: [name], [name] in Treatment: 3 Visit Information History Since Last Visit Added or deleted any medications: No Patient Arrived: Ambulatory Any new allergies or adverse reactions: No Arrival Time: 08:25 Had a fall or experienced change in No Accompanied By: self activities of daily living that may affect Transfer Assistance: None risk of falls: Patient Identification Verified: Yes Signs or symptoms of abuse/neglect since last visito No Secondary Verification Process Completed: Yes Hospitalized since last visit: No Patient Requires Transmission-Based Precautions: No Implantable device outside of the clinic excluding No Patient Has Alerts: No cellular tissue based products placed in the center since last visit: Has Dressing in Place as Prescribed: Yes Has Compression in Place as Prescribed: Yes Pain Present Now: No Electronic Signature(s) Signed: 08/26/2019 1:21:06 PM By: Deon Pilling Entered By: Deon Pilling on 08/26/2019 08:38:36 -------------------------------------------------------------------------------- Compression Therapy Details Patient Name: Date of Service: [name], [name] 08/26/2019 8:15 A M Medical Record Number: [id number] Patient Account Number: [id number] Date of Birth/Sex: Treating RN: 09/02/1965 (54 y.o. [name] Primary Care Nalaysia Manganiello: Precious Haws Other Clinician: Referring Delrico Minehart: Treating Valecia Beske/Extender: [name], [name] in Treatment: 3 Compression Therapy Performed for Wound Assessment: Wound #1 Right,Lateral Lower  Leg Performed By: Clinician Deon Pilling, RN Compression Type: Four Layer Pre Treatment ABI: 1.1 Electronic Signature(s) Signed: 08/26/2019 1:21:06 PM By: Deon Pilling Entered By: Deon Pilling on 08/26/2019 08:39:40 -------------------------------------------------------------------------------- Encounter Discharge Information Details Patient Name: Date of Service: [name], [name] 08/26/2019 8:15 A M Medical Record Number: [id number] Patient Account Number: [id number] Date of Birth/Sex: Treating RN: 01/08/1966 (54 y.o. [name] Primary Care Pammie Chirino: Precious Haws Other Clinician: Referring Darbie Biancardi: Treating Amandajo Gonder/Extender: Katheran Awe Weeks in Treatment: 3 Encounter Discharge Information Items Discharge Condition: Stable Ambulatory Status: Ambulatory Discharge Destination: Home Transportation: Private Auto Accompanied By: self Schedule Follow-up Appointment: Yes Clinical Summary of Care: Electronic Signature(s) Signed: 08/26/2019 1:21:06 PM By: Deon Pilling Entered By: Deon Pilling on 08/26/2019 08:40:50 -------------------------------------------------------------------------------- Patient/Caregiver Education Details Patient Name: Date of Service: [name], [name] 5/19/2021andnbsp8:15 A M Medical Record Number: [id number] Patient Account Number: [id number] Date of Birth/Gender: Treating RN: October 22, 1965 (54 y.o. [name] Primary Care Physician: Precious Haws Other Clinician: Referring Physician: Treating Physician/Extender: Burnard Hawthorne in Treatment: 3 Education Assessment Education Provided To: Patient Education Topics Provided Wound/Skin Impairment: Handouts: Skin Care Do's and Dont's Methods: Explain/Verbal Responses: Reinforcements needed Electronic Signature(s) Signed: 08/26/2019 1:21:06 PM By: Deon Pilling Entered By: Deon Pilling on 08/26/2019  08:40:29 -------------------------------------------------------------------------------- Wound Assessment Details Patient Name: Date of Service: [name], [name] 08/26/2019 8:15 A M Medical Record Number: [id number] Patient Account Number: [id number] Date of Birth/Sex: Treating RN: 1965/11/09 (54 y.o. [name] Primary Care Mitzy Naron: Precious Haws Other Clinician: Referring Genoveva Singleton: Treating Braedyn Riggle/Extender: [name], [name] in Treatment: 3 Wound Status Wound Number: 1 Primary Diabetic Wound/Ulcer of the Lower Extremity Etiology: Wound Location: Right, Lateral Lower Leg Wound Open Wounding Event: Trauma Status: Date Acquired: 04/10/2019 Comorbid Lymphedema, Sleep Apnea, Arrhythmia, Congestive Heart Failure, Weeks Of Treatment: 3 History: Hypertension, Peripheral Arterial Disease, Type II Diabetes Clustered Wound: No Wound Measurements Length: (cm) 1  Width: (cm) 0.9 Depth: (cm) 0.5 Area: (cm) 0.707 Volume: (cm) 0.353 % Reduction in Area: 18.2% % Reduction in Volume: -104% Epithelialization: Small (1-33%) Tunneling: No Undermining: No Wound Description Classification: Grade 2 Wound Margin: Distinct, outline attached Exudate Amount: Small Exudate Type: Serosanguineous Exudate Color: red, brown Foul Odor After Cleansing: No Slough/Fibrino Yes Wound Bed Granulation Amount: Medium (34-66%) Exposed Structure Granulation Quality: Red Fascia Exposed: No Necrotic Amount: Medium (34-66%) Fat Layer (Subcutaneous Tissue) Exposed: Yes Necrotic Quality: Adherent Slough Tendon Exposed: No Muscle Exposed: No Joint Exposed: No Bone Exposed: No Treatment Notes Wound #1 (Right, Lateral Lower Leg) 1. Cleanse With Wound Cleanser Soap and water 2. Periwound Care Moisturizing lotion TCA Cream 3. Primary Dressing Applied Collegen AG Hydrogel or K-Y Jelly 4. Secondary Dressing Dry Gauze Drawtex 6. Support Layer Applied 3 layer compression  wrap Notes netting. Electronic Signature(s) Signed: 08/26/2019 1:07:52 PM By: Baruch Gouty RN, BSN Signed: 08/26/2019 1:21:06 PM By: Deon Pilling Entered By: Deon Pilling on 08/26/2019 08:32:06 -------------------------------------------------------------------------------- Vitals Details Patient Name: Date of Service: [name], [name] 08/26/2019 8:15 A M Medical Record Number: [id number] Patient Account Number: [id number] Date of Birth/Sex: Treating RN: [birth year] (54 y.o. [name] Primary Care Nikiah Goin: Precious Haws Other Clinician: Referring Jordy Hewins: Treating Mistie Adney/Extender: [name], [name] in Treatment: 3 Vital Signs Time Taken: 10:25 Temperature (F): 98.2 Height (in): 66 Pulse (bpm): 73 Weight (lbs): 256 Respiratory Rate (breaths/min): 20 Body Mass Index (BMI): 41.3 Blood Pressure (mmHg): 159/90 Reference Range: 80 - 120 mg / dl Electronic Signature(s) Signed: 08/26/2019 1:21:06 PM By: Deon Pilling Entered By: Deon Pilling on 08/26/2019 08:38:53

## 2019-08-31 NOTE — Progress Notes (Signed)
Hilyard, VEGA ([id number]) Visit Report for 08/13/2019 Debridement Details Patient Name: Date of Service: [name], [name] 08/13/2019 12:30 PM Medical Record Number: [id number] Patient Account Number: [id number] Date of Birth/Sex: Treating RN: 02-05-1966 (54 y.o. [name] Primary Care Provider: Precious Haws Other Clinician: Referring Provider: Treating Provider/Extender: Norina Buzzard, Lemar Lofty in Treatment: 1 Debridement Performed for Assessment: Wound #1 Right,Lateral Lower Leg Performed By: Physician Ricard Dillon., MD Debridement Type: Debridement Severity of Tissue Pre Debridement: Fat layer exposed Level of Consciousness (Pre-procedure): Awake and Alert Pre-procedure Verification/Time Out Yes - 12:49 Taken: Start Time: 12:50 Pain Control: Lidocaine 4% T opical Solution T Area Debrided (L x W): otal 1.3 (cm) x 1.2 (cm) = 1.56 (cm) Tissue and other material debrided: Viable, Non-Viable, Slough, Subcutaneous, Skin: Dermis , Fibrin/Exudate, Slough Level: Skin/Subcutaneous Tissue Debridement Description: Excisional Instrument: Curette Bleeding: Minimum Hemostasis Achieved: Pressure End Time: 12:53 Procedural Pain: 0 Post Procedural Pain: 0 Response to Treatment: Procedure was tolerated well Level of Consciousness (Post- Awake and Alert procedure): Post Debridement Measurements of Total Wound Length: (cm) 1.3 Width: (cm) 1.2 Depth: (cm) 0.2 Volume: (cm) 0.245 Character of Wound/Ulcer Post Debridement: Requires Further Debridement Severity of Tissue Post Debridement: Fat layer exposed Post Procedure Diagnosis Same as Pre-procedure Electronic Signature(s) Signed: 08/13/2019 5:30:42 PM By: Linton Ham MD Signed: 08/31/2019 1:34:25 PM By: Deon Pilling Entered By: Linton Ham on 08/13/2019 12:56:35 -------------------------------------------------------------------------------- HPI Details Patient Name: Date of Service: [name], [name] 08/13/2019  12:30 PM Medical Record Number: [id number] Patient Account Number: [id number] Date of Birth/Sex: Treating RN: [birth year] (54 y.o. [name] Primary Care Provider: Precious Haws Other Clinician: Referring Provider: Treating Provider/Extender: Norina Buzzard, Lemar Lofty in Treatment: 1 History of Present Illness HPI Description: ADMISSION 08/04/2019 This is a 54 year old man who traumatized his right lateral lower leg while going [place] an escalator at the mall sometime in December 2020. He was referred to vascular surgery and on 04/14/2019 he saw Dr. Wenda Overland. He was felt to have "palpable pulses and normal noninvasive studies" although I cannot see these results. He was back in his primary doctor's office on 07/30/2019 for a nonhealing right lower extremity wound. He was referred to wound care I think in [facility] but his insurance was out of network. A CNS was ordered. X-ray was done that was negative. He has been using wound cleanser and a Band-Aid. Past medical history is actually quite extensive and includes chronic kidney disease stage IV, chronic diastolic heart failure, atrial fibrillation, poorly controlled hypertension, poorly controlled diabetes with peripheral neuropathy and a recent hemoglobin A1c of 11.5, depression, lymphedema and sleep apnea ABI in our clinic was 1.1 in the right 53; this wound was initially traumatized while the patient was going down on an escalator. Arrived in clinic last week with a completely nonviable surface. He required mechanical debridement and we have been using Iodoflex under compression. Apparently the compression wraps fell down. He also has poorly controlled diabetes Electronic Signature(s) Signed: 08/13/2019 5:30:42 PM By: Linton Ham MD Entered By: Linton Ham on 08/13/2019 12:58:09 -------------------------------------------------------------------------------- Physical Exam Details Patient Name: Date of Service: [name], REGINA  LD 08/13/2019 12:30 PM Medical Record Number: [id number] Patient Account Number: [id number] Date of Birth/Sex: Treating RN: 1966-02-20 (54 y.o. [name] Primary Care Provider: Precious Haws Other Clinician: Referring Provider: Treating Provider/Extender: Norina Buzzard, Lemar Lofty in Treatment: 1 Constitutional Patient is hypertensive.. Pulse regular and within target range for patient.Marland Kitchen Respirations regular, non-labored and within  target range.. Temperature is normal and within the target range for the patient.Marland Kitchen Appears in no distress. Integumentary (Hair, Skin) No evidence of infection around the wound. Notes Wound exam; right lateral calf just proximal to the lateral malleolus. Punched out wound still with necrotic debris although somewhat better than last week. Again a #3 curette the the note the surface of this removing tightly adherent fibrinous debris. He tolerates this marginally. Hemostasis with direct pressure. Electronic Signature(s) Signed: 08/13/2019 5:30:42 PM By: Linton Ham MD Entered By: Linton Ham on 08/13/2019 12:59:34 -------------------------------------------------------------------------------- Physician Orders Details Patient Name: Date of Service: [name], [name] 08/13/2019 12:30 PM Medical Record Number: [id number] Patient Account Number: [id number] Date of Birth/Sex: Treating RN: September 08, 1965 (54 y.o. [name] Primary Care Provider: Precious Haws Other Clinician: Referring Provider: Treating Provider/Extender: Norina Buzzard, Lemar Lofty in Treatment: 1 Verbal / Phone Orders: No Diagnosis Coding ICD-10 Coding Code Description S80.811D Abrasion, right lower leg, subsequent encounter L97.812 Non-pressure chronic ulcer of other part of right lower leg with fat layer exposed E11.622 Type 2 diabetes mellitus with other skin ulcer I89.0 Lymphedema, not elsewhere classified Follow-up Appointments Return Appointment in 1 week. Dressing  Change Frequency Do not change entire dressing for one week. Wound Cleansing May shower with protection. Primary Wound Dressing Wound #1 Right,Lateral Lower Leg Iodoflex Secondary Dressing Wound #1 Right,Lateral Lower Leg Dry Gauze Edema Control Wound #1 Right,Lateral Lower Leg 3 Layer Compression System - Right Lower Extremity - apply unna boot first layer to upper portion of lower leg to aid in securing the compression wrap. Avoid standing for long periods of time Elevate legs to the level of the heart or above for 30 minutes daily and/or when sitting, a frequency of: - throughout the day. Exercise regularly Electronic Signature(s) Signed: 08/13/2019 5:30:42 PM By: Linton Ham MD Signed: 08/13/2019 5:34:26 PM By: Deon Pilling Entered By: Deon Pilling on 08/13/2019 12:54:24 -------------------------------------------------------------------------------- Problem List Details Patient Name: Date of Service: [name], [name] 08/13/2019 12:30 PM Medical Record Number: [id number] Patient Account Number: [id number] Date of Birth/Sex: Treating RN: 1966-03-01 (54 y.o. [name] Primary Care Provider: Precious Haws Other Clinician: Referring Provider: Treating Provider/Extender: Norina Buzzard, Lemar Lofty in Treatment: 1 Active Problems ICD-10 Encounter Code Description Active Date MDM Diagnosis S80.811D Abrasion, right lower leg, subsequent encounter 08/04/2019 No Yes L97.812 Non-pressure chronic ulcer of other part of right lower leg with fat layer 08/04/2019 No Yes exposed E11.622 Type 2 diabetes mellitus with other skin ulcer 08/04/2019 No Yes I89.0 Lymphedema, not elsewhere classified 08/04/2019 No Yes Inactive Problems Resolved Problems Electronic Signature(s) Signed: 08/13/2019 5:30:42 PM By: Linton Ham MD Entered By: Linton Ham on 08/13/2019 12:55:58 -------------------------------------------------------------------------------- Progress Note  Details Patient Name: Date of Service: [name], [name] 08/13/2019 12:30 PM Medical Record Number: [id number] Patient Account Number: [id number] Date of Birth/Sex: Treating RN: 02-11-1966 (54 y.o. [name] Primary Care Provider: Precious Haws Other Clinician: Referring Provider: Treating Provider/Extender: Norina Buzzard, Lemar Lofty in Treatment: 1 Subjective History of Present Illness (HPI) ADMISSION 08/04/2019 This is a 54 year old man who traumatized his right lateral lower leg while going [place] an escalator at the mall sometime in December 2020. He was referred to vascular surgery and on 04/14/2019 he saw Dr. Wenda Overland. He was felt to have "palpable pulses and normal noninvasive studies" although I cannot see these results. He was back in his primary doctor's office on 07/30/2019 for a nonhealing right lower extremity wound. He was referred to  wound care I think in [employer] but his insurance was out of network. A CNS was ordered. X-ray was done that was negative. He has been using wound cleanser and a Band-Aid. Past medical history is actually quite extensive and includes chronic kidney disease stage IV, chronic diastolic heart failure, atrial fibrillation, poorly controlled hypertension, poorly controlled diabetes with peripheral neuropathy and a recent hemoglobin A1c of 11.5, depression, lymphedema and sleep apnea ABI in our clinic was 1.1 in the right 53; this wound was initially traumatized while the patient was going down on an escalator. Arrived in clinic last week with a completely nonviable surface. He required mechanical debridement and we have been using Iodoflex under compression. Apparently the compression wraps fell down. He also has poorly controlled diabetes Objective Constitutional Patient is hypertensive.. Pulse regular and within target range for patient.Marland Kitchen Respirations regular, non-labored and within target range.. Temperature is normal and within the target  range for the patient.Marland Kitchen Appears in no distress. Vitals Time Taken: 12:35 PM, Height: 66 in, Weight: 256 lbs, BMI: 41.3, Temperature: 98 F, Pulse: 79 bpm, Respiratory Rate: 20 breaths/min, Blood Pressure: 175/102 mmHg, Capillary Blood Glucose: 210 mg/dl. General Notes: Wound exam; right lateral calf just proximal to the lateral malleolus. Punched out wound still with necrotic debris although somewhat better than last week. Again a #3 curette the the note the surface of this removing tightly adherent fibrinous debris. He tolerates this marginally. Hemostasis with direct pressure. Integumentary (Hair, Skin) No evidence of infection around the wound. Wound #1 status is Open. Original cause of wound was Trauma. The wound is located on the Right,Lateral Lower Leg. The wound measures 1.3cm length x 1.2cm width x 0.2cm depth; 1.225cm^2 area and 0.245cm^3 volume. There is Fat Layer (Subcutaneous Tissue) Exposed exposed. There is no tunneling or undermining noted. There is a medium amount of serosanguineous drainage noted. The wound margin is distinct with the outline attached to the wound base. There is medium (34-66%) red, pink granulation within the wound bed. There is a medium (34-66%) amount of necrotic tissue within the wound bed including Adherent Slough. 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 There was a Excisional Skin/Subcutaneous Tissue Debridement with a total area of 1.56 sq cm performed by Ricard Dillon., MD. With the following instrument(s): Curette to remove Viable and  Non-Viable tissue/material. Material removed includes Subcutaneous Tissue, Slough, Skin: Dermis, and Fibrin/Exudate after achieving pain control using Lidocaine 4% Topical Solution. A time out was conducted at 12:49, prior to the start of the procedure. A Minimum amount of bleeding was controlled with Pressure. The procedure was tolerated well with a pain level of 0 throughout and a pain level of 0 following the procedure. Post Debridement Measurements: 1.3cm length x 1.2cm width x 0.2cm depth; 0.245cm^3 volume. Character of Wound/Ulcer Post Debridement requires further debridement. Severity of Tissue Post Debridement is: Fat layer exposed. Post procedure Diagnosis Wound #1: Same as Pre-Procedure Pre-procedure diagnosis of Wound #1 is a Diabetic Wound/Ulcer of the Lower Extremity located on the Right,Lateral Lower Leg . There was a Three Layer Compression Therapy Procedure with a pre-treatment ABI of 1.1 by Baruch Gouty, RN. Post  procedure Diagnosis Wound #1: Same as Pre-Procedure Plan Follow-up Appointments: Return Appointment in 1 week. Dressing Change Frequency: Do not change entire dressing for one week. Wound Cleansing: May shower with protection. Primary Wound Dressing: Wound #1 Right,Lateral Lower Leg: Iodoflex Secondary Dressing: Wound #1 Right,Lateral Lower Leg: Dry Gauze Edema Control: Wound #1 Right,Lateral Lower Leg: 3 Layer Compression System - Right Lower Extremity - apply unna boot first layer to upper portion of lower leg to aid in securing the compression wrap. Avoid standing for long periods of time Elevate legs to the level of the heart or above for 30 minutes daily and/or when sitting, a frequency of: - throughout the day. Exercise regularly 1. Iodoflex to continue 2. Look forward to silver collagen next week perhaps. Could be the week after 3. We told him it if the wrap slips down to call us and we will change it. We will try to anchor it with Louretta Parma boot at the  time today Electronic Signature(s) Signed: 08/13/2019 5:30:42 PM By: Linton Ham MD Entered By: Linton Ham on 08/13/2019 13:00:15 -------------------------------------------------------------------------------- SuperBill Details Patient Name: Date of Service: [name], [name] 08/13/2019 Medical Record Number: [id number] Patient Account Number: [id number] Date of Birth/Sex: Treating RN: 1966-01-27 (54 y.o. [name] Primary Care Provider: Precious Haws Other Clinician: Referring Provider: Treating Provider/Extender: Norina Buzzard, Lemar Lofty in Treatment: 1 Diagnosis Coding ICD-10 Codes Code Description [phone] Abrasion, right lower leg, subsequent encounter L97.812 Non-pressure chronic ulcer of other part of right lower leg with fat layer exposed E11.622 Type 2 diabetes mellitus with other skin ulcer I89.0 Lymphedema, not elsewhere classified Facility Procedures CPT4 Code: [id number] Description: 21115 - DEB SUBQ TISSUE 20 SQ CM/< ICD-10 Diagnosis Description L97.812 Non-pressure chronic ulcer of other part of right lower leg with fat layer exp Modifier: osed Quantity: 1 Physician Procedures : CPT4 Code Description Modifier [id number] - WC PHYS SUBQ TISS 20 SQ CM ICD-10 Diagnosis Description V36.122 Non-pressure chronic ulcer of other part of right lower leg with fat layer exposed Quantity: 1 Electronic Signature(s) Signed: 08/13/2019 5:30:42 PM By: Linton Ham MD Entered By: Linton Ham on 08/13/2019 13:00:26

## 2019-08-31 NOTE — Progress Notes (Signed)
[place], AURTHUR ([id number]) Visit Report for 08/13/2019 Arrival Information Details Patient Name: Date of Service: [name], Rollene Fare LD 08/13/2019 12:30 PM Medical Record Number: [id number] Patient Account Number: [id number] Date of Birth/Sex: Treating RN: 10-Jan-1966 (54 y.o. [name] Primary Care Natassia Guthridge: Precious Haws Other Clinician: Referring Malachi Kinzler: Treating Hafsa Lohn/Extender: [name] in Treatment: 1 Visit Information History Since Last Visit Added or deleted any medications: No Patient Arrived: Ambulatory Any new allergies or adverse reactions: No Arrival Time: 12:34 Had a fall or experienced change in No Accompanied By: mother activities of daily living that may affect Transfer Assistance: None risk of falls: Patient Identification Verified: Yes Signs or symptoms of abuse/neglect since last visito No Secondary Verification Process Completed: Yes Hospitalized since last visit: No Patient Requires Transmission-Based Precautions: No Implantable device outside of the clinic excluding No Patient Has Alerts: No cellular tissue based products placed in the center since last visit: Has Dressing in Place as Prescribed: Yes Has Compression in Place as Prescribed: Yes Pain Present Now: Yes Notes patient compression wrap sled down. Educated patient on how to care for dressing and to call wound center. patient in agreement. Electronic Signature(s) Signed: 08/13/2019 5:34:26 PM By: Deon Pilling Entered By: Deon Pilling on 08/13/2019 12:44:06 -------------------------------------------------------------------------------- Compression Therapy Details Patient Name: Date of Service: [name], [name] 08/13/2019 12:30 PM Medical Record Number: [id number] Patient Account Number: [id number] Date of Birth/Sex: Treating RN: 06-01-1965 (54 y.o. [name] Primary Care Evan Mackie: Precious Haws Other Clinician: Referring Adriyana Greenbaum: Treating Vola Beneke/Extender: Norina Buzzard, Lemar Lofty in Treatment: 1 Compression Therapy Performed for Wound Assessment: Wound #1 Right,Lateral Lower Leg Performed By: Clinician Baruch Gouty, RN Compression Type: Three Layer Pre Treatment ABI: 1.1 Post Procedure Diagnosis Same as Pre-procedure Electronic Signature(s) Signed: 08/13/2019 5:34:26 PM By: Deon Pilling Entered By: Deon Pilling on 08/13/2019 12:51:46 -------------------------------------------------------------------------------- Encounter Discharge Information Details Patient Name: Date of Service: [name], [name] 08/13/2019 12:30 PM Medical Record Number: [id number] Patient Account Number: [id number] Date of Birth/Sex: Treating RN: Feb 07, 1966 (54 y.o. [name] Primary Care Zaeem Kandel: Precious Haws Other Clinician: Referring Seraj Dunnam: Treating Bayne Fosnaugh/Extender: [name] in Treatment: 1 Encounter Discharge Information Items Post Procedure Vitals Discharge Condition: Stable Temperature (F): 98 Ambulatory Status: Ambulatory Pulse (bpm): 79 Discharge Destination: Home Respiratory Rate (breaths/min): 18 Transportation: Private Auto Blood Pressure (mmHg): 175/102 Accompanied By: mother Schedule Follow-up Appointment: Yes Clinical Summary of Care: Patient Declined Electronic Signature(s) Signed: 08/13/2019 5:01:19 PM By: Baruch Gouty RN, BSN Entered By: Baruch Gouty on 08/13/2019 13:08:22 -------------------------------------------------------------------------------- Lower Extremity Assessment Details Patient Name: Date of Service: [name], [name] 08/13/2019 12:30 PM Medical Record Number: [id number] Patient Account Number: [id number] Date of Birth/Sex: Treating RN: Aug 19, 1965 (54 y.o. [name] Primary Care Laportia Carley: Precious Haws Other Clinician: Referring Azzam Mehra: Treating Jyasia Markoff/Extender: Norina Buzzard, Lynelle Smoke Weeks in Treatment: 1 Edema Assessment Assessed: [Left: No] [Right:  Yes] Edema: [Left: Ye] [Right: s] Calf Left: Right: Point of Measurement: 31 cm From Medial Instep cm 45 cm Ankle Left: Right: Point of Measurement: 8 cm From Medial Instep cm 24.5 cm Vascular Assessment Pulses: Dorsalis Pedis Palpable: [Right:Yes] Electronic Signature(s) Signed: 08/13/2019 5:34:26 PM By: Deon Pilling Signed: 08/31/2019 1:34:25 PM By: Deon Pilling Entered By: Deon Pilling on 08/13/2019 12:45:01 -------------------------------------------------------------------------------- Multi Wound Chart Details Patient Name: Date of Service: [name], [name] 08/13/2019 12:30 PM Medical Record Number: [id number] Patient Account Number: [id number] Date of Birth/Sex: Treating RN: January 08, 1966 (54 y.o. [name], [name] Primary  Care Hector Taft: Precious Haws Other Clinician: Referring Sophee Mckimmy: Treating Allina Riches/Extender: Norina Buzzard, Lemar Lofty in Treatment: 1 Vital Signs Height(in): 66 Capillary Blood Glucose(mg/dl): 210 Weight(lbs): 256 Pulse(bpm): 79 Body Mass Index(BMI): 41 Blood Pressure(mmHg): 175/102 Temperature(F): 98 Respiratory Rate(breaths/min): 20 Photos: [1:No Photos Right, Lateral Lower Leg] [N/A:N/A N/A] Wound Location: [1:Trauma] [N/A:N/A] Wounding Event: [1:Diabetic Wound/Ulcer of the Lower] [N/A:N/A] Primary Etiology: [1:Extremity Lymphedema, Sleep Apnea,] [N/A:N/A] Comorbid History: [1:Arrhythmia, Congestive Heart Failure, Hypertension, Peripheral Arterial Disease, Type II Diabetes 04/10/2019] [N/A:N/A] Date Acquired: [1:1] [N/A:N/A] Weeks of Treatment: [1:Open] [N/A:N/A] Wound Status: [1:1.3x1.2x0.2] [N/A:N/A] Measurements L x W x D (cm) [1:1.225] [N/A:N/A] A (cm) : rea [1:0.245] [N/A:N/A] Volume (cm) : [1:-41.80%] [N/A:N/A] % Reduction in A [1:rea: -41.60%] [N/A:N/A] % Reduction in Volume: [1:Grade 2] [N/A:N/A] Classification: [1:Medium] [N/A:N/A] Exudate A mount: [1:Serosanguineous] [N/A:N/A] Exudate Type: [1:red, brown]  [N/A:N/A] Exudate Color: [1:Distinct, outline attached] [N/A:N/A] Wound Margin: [1:Medium (34-66%)] [N/A:N/A] Granulation A mount: [1:Red, Pink] [N/A:N/A] Granulation Quality: [1:Medium (34-66%)] [N/A:N/A] Necrotic A mount: [1:Fat Layer (Subcutaneous Tissue)] [N/A:N/A] Exposed Structures: [1:Exposed: Yes Fascia: No Tendon: No Muscle: No Joint: No Bone: No None] [N/A:N/A] Epithelialization: [1:Debridement - Excisional] [N/A:N/A] Debridement: Pre-procedure Verification/Time Out 12:49 [N/A:N/A] Taken: [1:Lidocaine 4% Topical Solution] [N/A:N/A] Pain Control: [1:Subcutaneous, Slough] [N/A:N/A] Tissue Debrided: [1:Skin/Subcutaneous Tissue] [N/A:N/A] Level: [1:1.56] [N/A:N/A] Debridement A (sq cm): [1:rea Curette] [N/A:N/A] Instrument: [1:Minimum] [N/A:N/A] Bleeding: [1:Pressure] [N/A:N/A] Hemostasis A chieved: [1:0] [N/A:N/A] Procedural Pain: [1:0] [N/A:N/A] Post Procedural Pain: [1:Procedure was tolerated well] [N/A:N/A] Debridement Treatment Response: [1:1.3x1.2x0.2] [N/A:N/A] Post Debridement Measurements L x W x D (cm) [1:0.245] [N/A:N/A] Post Debridement Volume: (cm) [1:Compression Therapy] [N/A:N/A] Procedures Performed: [1:Debridement] Treatment Notes Electronic Signature(s) Signed: 08/13/2019 5:30:42 PM By: Linton Ham MD Signed: 08/31/2019 1:34:25 PM By: Deon Pilling Entered By: Linton Ham on 08/13/2019 12:56:11 -------------------------------------------------------------------------------- Multi-Disciplinary Care Plan Details Patient Name: Date of Service: [name], [name] 08/13/2019 12:30 PM Medical Record Number: [id number] Patient Account Number: [id number] Date of Birth/Sex: Treating RN: Mar 31, 1966 (54 y.o. [name] Primary Care Hodari Chuba: Precious Haws Other Clinician: Referring Betty Daidone: Treating Xander Jutras/Extender: Norina Buzzard, Lemar Lofty in Treatment: 1 Active Inactive Wound/Skin Impairment Nursing Diagnoses: Knowledge deficit related to  ulceration/compromised skin integrity Goals: Patient/caregiver will verbalize understanding of skin care regimen Date Initiated: 08/04/2019 Target Resolution Date: 09/04/2019 Goal Status: Active Ulcer/skin breakdown will have a volume reduction of 30% by week 4 Date Initiated: 08/04/2019 Target Resolution Date: 09/04/2019 Goal Status: Active Interventions: Assess patient/caregiver ability to obtain necessary supplies Assess patient/caregiver ability to perform ulcer/skin care regimen upon admission and as needed Assess ulceration(s) every visit Notes: Electronic Signature(s) Signed: 08/13/2019 5:34:26 PM By: Deon Pilling Signed: 08/31/2019 1:34:25 PM By: Deon Pilling Entered By: Deon Pilling on 08/13/2019 12:49:29 -------------------------------------------------------------------------------- Pain Assessment Details Patient Name: Date of Service: [name], [name] 08/13/2019 12:30 PM Medical Record Number: [id number] Patient Account Number: [id number] Date of Birth/Sex: Treating RN: 1965-12-22 (54 y.o. [name] Primary Care Soua Caltagirone: Precious Haws Other Clinician: Referring Shoshanna Mcquitty: Treating Brance Dartt/Extender: [name] in Treatment: 1 Active Problems Location of Pain Severity and Description of Pain Patient Has Paino Yes Site Locations Pain Location: Pain Location: Pain in Ulcers Rate the pain. Current Pain Level: 8 Worst Pain Level: 10 Least Pain Level: 0 Tolerable Pain Level: 7 Character of Pain Describe the Pain: Aching, Burning Pain Management and Medication Current Pain Management: Medication: Yes Cold Application: No Rest: Yes Massage: No Activity: No T.E.N.S.: No Heat Application: No Leg drop or elevation: Yes Is the Current Pain Management  Adequate: Inadequate How does your wound impact your activities of daily livingo Sleep: No Bathing: No Appetite: No Relationship With Others: No Bladder Continence: No Emotions:  No Bowel Continence: No Work: No Toileting: No Drive: No Dressing: No Hobbies: No Electronic Signature(s) Signed: 08/13/2019 5:34:26 PM By: Deon Pilling Signed: 08/31/2019 1:34:25 PM By: Deon Pilling Entered By: Deon Pilling on 08/13/2019 12:44:50 -------------------------------------------------------------------------------- Patient/Caregiver Education Details Patient Name: Date of Service: [name], [name] 5/6/2021andnbsp12:30 PM Medical Record Number: [id number] Patient Account Number: [id number] Date of Birth/Gender: Treating RN: 11/27/1965 (54 y.o. [name] Primary Care Physician: Precious Haws Other Clinician: Referring Physician: Treating Physician/Extender: [name] in Treatment: 1 Education Assessment Education Provided To: Patient Education Topics Provided Wound/Skin Impairment: Handouts: Skin Care Do's and Dont's Methods: Explain/Verbal Responses: Reinforcements needed Electronic Signature(s) Signed: 08/13/2019 5:34:26 PM By: Deon Pilling Entered By: Deon Pilling on 08/13/2019 12:49:40 -------------------------------------------------------------------------------- Wound Assessment Details Patient Name: Date of Service: [name], [name] 08/13/2019 12:30 PM Medical Record Number: [id number] Patient Account Number: [id number] Date of Birth/Sex: Treating RN: 09/14/1965 (54 y.o. [name] Primary Care Ottavio Norem: Precious Haws Other Clinician: Referring Ida Uppal: Treating Nickole Adamek/Extender: Norina Buzzard, Lynelle Smoke Weeks in Treatment: 1 Wound Status Wound Number: 1 Primary Diabetic Wound/Ulcer of the Lower Extremity Etiology: Wound Location: Right, Lateral Lower Leg Wound Open Wounding Event: Trauma Status: Date Acquired: 04/10/2019 Comorbid Lymphedema, Sleep Apnea, Arrhythmia, Congestive Heart Failure, Weeks Of Treatment: 1 History: Hypertension, Peripheral Arterial Disease, Type II Diabetes Clustered Wound:  No Photos Photo Uploaded By: Mikeal Hawthorne on 08/14/2019 08:11:23 Wound Measurements Length: (cm) 1.3 Width: (cm) 1.2 Depth: (cm) 0.2 Area: (cm) 1.225 Volume: (cm) 0.245 % Reduction in Area: -41.8% % Reduction in Volume: -41.6% Epithelialization: None Tunneling: No Undermining: No Wound Description Classification: Grade 2 Wound Margin: Distinct, outline attached Exudate Amount: Medium Exudate Type: Serosanguineous Exudate Color: red, brown Foul Odor After Cleansing: No Slough/Fibrino Yes Wound Bed Granulation Amount: Medium (34-66%) Exposed Structure Granulation Quality: Red, Pink Fascia Exposed: No Necrotic Amount: Medium (34-66%) Fat Layer (Subcutaneous Tissue) Exposed: Yes Necrotic Quality: Adherent Slough Tendon Exposed: No Muscle Exposed: No Joint Exposed: No Bone Exposed: No Electronic Signature(s) Signed: 08/13/2019 5:34:26 PM By: Deon Pilling Signed: 08/31/2019 1:34:25 PM By: Deon Pilling Entered By: Deon Pilling on 08/13/2019 12:45:49 -------------------------------------------------------------------------------- Vitals Details Patient Name: Date of Service: [name], [name] 08/13/2019 12:30 PM Medical Record Number: [id number] Patient Account Number: [id number] Date of Birth/Sex: Treating RN: 25-Mar-1966 (54 y.o. [name] Primary Care Reginna Sermeno: Precious Haws Other Clinician: Referring Elanda Garmany: Treating Iola Turri/Extender: Norina Buzzard, Lemar Lofty in Treatment: 1 Vital Signs Time Taken: 12:35 Temperature (F): 98 Height (in): 66 Pulse (bpm): 79 Weight (lbs): 256 Respiratory Rate (breaths/min): 20 Body Mass Index (BMI): 41.3 Blood Pressure (mmHg): 175/102 Capillary Blood Glucose (mg/dl): 210 Reference Range: 80 - 120 mg / dl Electronic Signature(s) Signed: 08/13/2019 5:34:26 PM By: Deon Pilling Entered By: Deon Pilling on 08/13/2019 12:44:31

## 2019-09-02 ENCOUNTER — Encounter (HOSPITAL_BASED_OUTPATIENT_CLINIC_OR_DEPARTMENT_OTHER): Payer: Medicare Other | Admitting: Physician Assistant

## 2019-09-02 DIAGNOSIS — E11622 Type 2 diabetes mellitus with other skin ulcer: Secondary | ICD-10-CM | POA: Diagnosis not present

## 2019-09-02 NOTE — Progress Notes (Signed)
Pucciarelli, EMMITT ([id number]) Visit Report for 08/26/2019 SuperBill Details Patient Name: Date of Service: [name], [name] 08/26/2019 Medical Record Number: [id number] Patient Account Number: [id number] Date of Birth/Sex: Treating RN: 29-Apr-1965 (54 y.o. [name] Primary Care Provider: Precious Haws Other Clinician: Referring Provider: Treating Provider/Extender: [name], [name] in Treatment: 3 Diagnosis Coding ICD-10 Codes Code Description [phone] Abrasion, right lower leg, subsequent encounter L97.812 Non-pressure chronic ulcer of other part of right lower leg with fat layer exposed E11.622 Type 2 diabetes mellitus with other skin ulcer I89.0 Lymphedema, not elsewhere classified Facility Procedures CPT4 Code Description Modifier Quantity [id number] (Facility Use Only) [phone] - APPLY MULTLAY COMPRS LWR RT LEG 1 Electronic Signature(s) Signed: 08/26/2019 1:21:06 PM By: Deon Pilling Signed: 09/02/2019 12:40:59 PM By: Worthy Keeler PA-C Entered By: Deon Pilling on 08/26/2019 08:41:40

## 2019-09-02 NOTE — Progress Notes (Signed)
Cunanan, ARJAY ([id number]) Visit Report for 08/19/2019 SuperBill Details Patient Name: Date of Service: [name], [name] 08/19/2019 Medical Record Number: [id number] Patient Account Number: [id number] Date of Birth/Sex: Treating RN: 1965-11-14 (54 y.o. [name] Primary Care Provider: Precious [name] Other Clinician: Referring Provider: Treating Provider/Extender: [name], [name] in Treatment: 2 Diagnosis Coding ICD-10 Codes Code Description [phone] Abrasion, right lower leg, subsequent encounter L97.812 Non-pressure chronic ulcer of other part of right lower leg with fat layer exposed E11.622 Type 2 diabetes mellitus with other skin ulcer I89.0 Lymphedema, not elsewhere classified Facility Procedures CPT4 Code Description Modifier Quantity [id number] (Facility Use Only) [phone] - APPLY MULTLAY COMPRS LWR RT LEG 1 Electronic Signature(s) Signed: 08/20/2019 12:51:17 PM By: Baruch Gouty RN, BSN Signed: 09/02/2019 12:40:59 PM By: Worthy Keeler PA-C Entered By: Baruch Gouty on 08/19/2019 11:08:49

## 2019-09-02 NOTE — Progress Notes (Addendum)
Biglow, FRANCK ([id number]) Visit Report for 09/02/2019 Chief Complaint Document Details Patient Name: Date of Service: [name], [name] 09/02/2019 10:45 A M Medical Record Number: [id number] Patient Account Number: [id number] Date of Birth/Sex: Treating RN: 01/27/1966 (54 y.o. [name] Primary Care Provider: Precious Haws Other Clinician: Referring Provider: Treating Provider/Extender: [name], [name] in Treatment: 4 Information Obtained from: Patient Chief Complaint 08/04/2019; patient is here for review of the wound on the right lateral lower leg Electronic Signature(s) Signed: 09/02/2019 11:20:17 AM By: Worthy Keeler PA-C Entered By: Worthy Keeler on 09/02/2019 11:20:16 -------------------------------------------------------------------------------- Debridement Details Patient Name: Date of Service: [name], [name] 09/02/2019 10:45 A M Medical Record Number: [id number] Patient Account Number: [id number] Date of Birth/Sex: Treating RN: 09-20-1965 (54 y.o. [name] Primary Care Provider: Precious Haws Other Clinician: Referring Provider: Treating Provider/Extender: [name], [name] in Treatment: 4 Debridement Performed for Assessment: Wound #1 Right,Lateral Lower Leg Performed By: Physician Worthy Keeler, PA Debridement Type: Debridement Severity of Tissue Pre Debridement: Fat layer exposed Level of Consciousness (Pre-procedure): Awake and Alert Pre-procedure Verification/Time Out Yes - 12:04 Taken: Start Time: 12:04 Pain Control: Other : benzocaine 20 % spray T Area Debrided (L x W): otal 1.5 (cm) x 1.8 (cm) = 2.7 (cm) Tissue and other material debrided: Viable, Non-Viable, Eschar, Subcutaneous, Skin: Epidermis Level: Skin/Subcutaneous Tissue Debridement Description: Excisional Instrument: Curette Bleeding: Minimum Hemostasis Achieved: Pressure End Time: 12:09 Procedural Pain: 7 Post Procedural Pain: 4 Response to  Treatment: Procedure was tolerated well Level of Consciousness (Post- Awake and Alert procedure): Post Debridement Measurements of Total Wound Length: (cm) 1.5 Width: (cm) 1.8 Depth: (cm) 0.2 Volume: (cm) 0.424 Character of Wound/Ulcer Post Debridement: Improved Severity of Tissue Post Debridement: Fat layer exposed Post Procedure Diagnosis Same as Pre-procedure Electronic Signature(s) Signed: 09/02/2019 1:28:30 PM By: Worthy Keeler PA-C Signed: 09/02/2019 2:12:16 PM By: Baruch Gouty RN, BSN Entered By: Baruch Gouty on 09/02/2019 12:09:39 -------------------------------------------------------------------------------- HPI Details Patient Name: Date of Service: [name], [name] 09/02/2019 10:45 A M Medical Record Number: [id number] Patient Account Number: [id number] Date of Birth/Sex: Treating RN: Dec 05, 1965 (54 y.o. [name] Primary Care Provider: Precious Haws Other Clinician: Referring Provider: Treating Provider/Extender: [name], [name] in Treatment: 4 History of Present Illness HPI Description: ADMISSION 08/04/2019 This is a 54 year old man who traumatized his right lateral lower leg while going [place] an escal[place] at the mall sometime in December 2020. He was referred to vascular surgery and on 04/14/2019 he saw Dr. Wenda Overland. He was felt to have "palpable pulses and normal noninvasive studies" although I cannot see these results. He was back in his primary doctor's office on 07/30/2019 for a nonhealing right lower extremity wound. He was referred to wound care I think in [facility] but his insurance was out of network. A CNS was ordered. X-ray was done that was negative. He has been using wound cleanser and a Band-Aid. Past medical history is actually quite extensive and includes chronic kidney disease stage IV, chronic diastolic heart failure, atrial fibrillation, poorly controlled hypertension, poorly controlled diabetes with peripheral neuropathy  and a recent hemoglobin A1c of 11.5, depression, lymphedema and sleep apnea ABI in our clinic was 1.1 in the right 5/6; this wound was initially traumatized while the patient was going down on an escalator. Arrived in clinic last week with a completely nonviable surface. He required mechanical debridement and we have been using Iodoflex under compression.  Apparently the compression wraps fell down. He also has poorly controlled diabetes 5/15; traumatic/contusion wound to the right lateral lower leg. We have been using Iodoflex to clean at the surface of this which was completely nonviable along with mechanical debridements. He is under compression. 09/02/2019 upon evaluation today patient appears to be doing well with regard to his wound on his right lateral leg. He has been tolerating the dressing changes without complication. Fortunately there is no signs of active infection at this time. No fevers, chills, nausea, vomiting, or diarrhea. With that being said there is some necrotic tissue around the edges of the wound that is and require some sharp debridement today. That was discussed with the patient and his mother in the office today prior to debridement to which she did consent. Electronic Signature(s) Signed: 09/02/2019 12:36:00 PM By: Worthy Keeler PA-C Entered By: Worthy Keeler on 09/02/2019 12:35:59 -------------------------------------------------------------------------------- Physical Exam Details Patient Name: Date of Service: [name], [name] 09/02/2019 10:45 A M Medical Record Number: [id number] Patient Account Number: [id number] Date of Birth/Sex: Treating RN: Mar 01, 1966 (54 y.o. [name] Primary Care Provider: Precious Haws Other Clinician: Referring Provider: Treating Provider/Extender: [name], [name] in Treatment: 4 Constitutional Well-nourished and well-hydrated in no acute distress. Respiratory normal breathing without  difficulty. Psychiatric this patient is able to make decisions and demonstrates good insight into disease process. Alert and Oriented x 3. pleasant and cooperative. Notes His wound did have some necrotic debris around the edge of the wound that require sharp debridement today that was performed by way of utilizing a curette without complication the patient had no significant pain and post debridement the wound bed appears to be doing much better which is great news. Electronic Signature(s) Signed: 09/02/2019 12:36:19 PM By: Worthy Keeler PA-C Entered By: Worthy Keeler on 09/02/2019 12:36:18 -------------------------------------------------------------------------------- Physician Orders Details Patient Name: Date of Service: [name], [name] 09/02/2019 10:45 A M Medical Record Number: [id number] Patient Account Number: [id number] Date of Birth/Sex: Treating RN: 1965/04/21 (54 y.o. [name] Primary Care Provider: Precious Haws Other Clinician: Referring Provider: Treating Provider/Extender: [name], [name] in Treatment: 4 Verbal / Phone Orders: No Diagnosis Coding ICD-10 Coding Code Description S80.811D Abrasion, right lower leg, subsequent encounter L97.812 Non-pressure chronic ulcer of other part of right lower leg with fat layer exposed E11.622 Type 2 diabetes mellitus with other skin ulcer I89.0 Lymphedema, not elsewhere classified Follow-up Appointments Return Appointment in 1 week. Dressing Change Frequency Do not change entire dressing for one week. Skin Barriers/Peri-Wound Care Moisturizing lotion - mixed with TCA to leg TCA Cream or Ointment Wound Cleansing May shower with protection. Primary Wound Dressing Wound #1 Right,Lateral Lower Leg Silver Collagen - moisten with hydrogel Secondary Dressing Wound #1 Right,Lateral Lower Leg Dry Gauze Drawtex - cut to fit inside wound edges Edema Control Wound #1 Right,Lateral Lower Leg 3 Layer  Compression System - Right Lower Extremity - apply unna boot first layer to upper portion of lower leg to aid in securing the compression wrap. use kerlix instead of cotton for contact layer Avoid standing for long periods of time Elevate legs to the level of the heart or above for 30 minutes daily and/or when sitting, a frequency of: - throughout the day. Exercise regularly Patient Medications llergies: No Known Drug Allergies A Notifications Medication Indication Start End prior to debridement 09/02/2019 benzocaine DOSE topical 20 % aerosol - aerosol topical Electronic Signature(s) Signed: 09/02/2019 1:28:30 PM  By: Worthy Keeler PA-C Signed: 09/02/2019 2:12:16 PM By: Baruch Gouty RN, BSN Entered By: Baruch Gouty on 09/02/2019 12:11:25 -------------------------------------------------------------------------------- Problem List Details Patient Name: Date of Service: [name], [name] 09/02/2019 10:45 A M Medical Record Number: [id number] Patient Account Number: [id number] Date of Birth/Sex: Treating RN: Jul 16, 1965 (54 y.o. [name] Primary Care Provider: Precious Haws Other Clinician: Referring Provider: Treating Provider/Extender: [name], [name] in Treatment: 4 Active Problems ICD-10 Encounter Code Description Active Date MDM Diagnosis S80.811D Abrasion, right lower leg, subsequent encounter 08/04/2019 No Yes L97.812 Non-pressure chronic ulcer of other part of right lower leg with fat layer 08/04/2019 No Yes exposed E11.622 Type 2 diabetes mellitus with other skin ulcer 08/04/2019 No Yes I89.0 Lymphedema, not elsewhere classified 08/04/2019 No Yes Inactive Problems Resolved Problems Electronic Signature(s) Signed: 09/02/2019 11:20:09 AM By: Worthy Keeler PA-C Entered By: Worthy Keeler on 09/02/2019 11:20:08 -------------------------------------------------------------------------------- Progress Note Details Patient Name: Date of  Service: [name], [name] 09/02/2019 10:45 A M Medical Record Number: [id number] Patient Account Number: [id number] Date of Birth/Sex: Treating RN: 01/11/1966 (54 y.o. [name] Primary Care Provider: Precious Haws Other Clinician: Referring Provider: Treating Provider/Extender: [name], [name] in Treatment: 4 Subjective Chief Complaint Information obtained from Patient 08/04/2019; patient is here for review of the wound on the right lateral lower leg History of Present Illness (HPI) ADMISSION 08/04/2019 This is a 54 year old man who traumatized his right lateral lower leg while going up an escalator at the mall sometime in December 2020. He was referred to vascular surgery and on 04/14/2019 he saw Dr. Wenda Overland. He was felt to have "palpable pulses and normal noninvasive studies" although I cannot see these results. He was back in his primary doctor's office on 07/30/2019 for a nonhealing right lower extremity wound. He was referred to wound care I think in [facility] but his insurance was out of network. A CNS was ordered. X-ray was done that was negative. He has been using wound cleanser and a Band-Aid. Past medical history is actually quite extensive and includes chronic kidney disease stage IV, chronic diastolic heart failure, atrial fibrillation, poorly controlled hypertension, poorly controlled diabetes with peripheral neuropathy and a recent hemoglobin A1c of 11.5, depression, lymphedema and sleep apnea ABI in our clinic was 1.1 in the right 5/6; this wound was initially traumatized while the patient was going down on an escalator. Arrived in clinic last week with a completely nonviable surface. He required mechanical debridement and we have been using Iodoflex under compression. Apparently the compression wraps fell down. He also has poorly controlled diabetes 5/15; traumatic/contusion wound to the right lateral lower leg. We have been using Iodoflex to clean at  the surface of this which was completely nonviable along with mechanical debridements. He is under compression. 09/02/2019 upon evaluation today patient appears to be doing well with regard to his wound on his right lateral leg. He has been tolerating the dressing changes without complication. Fortunately there is no signs of active infection at this time. No fevers, chills, nausea, vomiting, or diarrhea. With that being said there is some necrotic tissue around the edges of the wound that is and require some sharp debridement today. That was discussed with the patient and his mother in the office today prior to debridement to which she did consent. Objective Constitutional Well-nourished and well-hydrated in no acute distress. Vitals Time Taken: 11:26 AM, Height: 66 in, Weight: 256 lbs, BMI: 41.3, Temperature:  98.1 F, Pulse: 75 bpm, Respiratory Rate: 20 breaths/min, Blood Pressure: 143/101 mmHg. Respiratory normal breathing without difficulty. Psychiatric this patient is able to make decisions and demonstrates good insight into disease process. Alert and Oriented x 3. pleasant and cooperative. General Notes: His wound did have some necrotic debris around the edge of the wound that require sharp debridement today that was performed by way of utilizing a curette without complication the patient had no significant pain and post debridement the wound bed appears to be doing much better which is great news. Integumentary (Hair, Skin) Wound #1 status is Open. Original cause of wound was Trauma. The wound is located on the Right,Lateral Lower Leg. The wound measures 1.1cm length x 1.2cm width x 0.3cm depth; 1.037cm^2 area and 0.311cm^3 volume. There is Fat Layer (Subcutaneous Tissue) Exposed exposed. There is no tunneling or undermining noted. There is a small amount of serosanguineous drainage noted. The wound margin is distinct with the outline attached to the wound base. There is large (67-100%)  pink granulation within the wound bed. There is a small (1-33%) amount of necrotic tissue within the wound bed including Adherent Slough. 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 There was a Excisional Skin/Subcutaneous Tissue Debridement with a total area of 2.7 sq cm performed by Worthy Keeler, PA. With the following instrument(s): Curette to remove Viable and Non-Viable tissue/material. Material removed includes Eschar, Subcutaneous Tissue, and Skin: Epidermis after achieving pain control using Other (benzocaine 20 % spray). No specimens were taken. A time out was conducted at 12:04, prior to the start of the procedure. A Minimum amount of bleeding was controlled with Pressure. The procedure was tolerated well with a pain level of 7 throughout and a pain level of 4 following the procedure. Post Debridement Measurements: 1.5cm length x 1.8cm width x 0.2cm depth; 0.424cm^3 volume. Character of Wound/Ulcer Post Debridement is improved. Severity of Tissue Post Debridement is: Fat layer exposed. Post procedure Diagnosis Wound #1: Same as Pre-Procedure Pre-procedure diagnosis of Wound #1 is a Diabetic Wound/Ulcer of the Lower Extremity located on the Right,Lateral Lower Leg . There was a Three Layer Compression Therapy Procedure by [name], RN. Post procedure Diagnosis Wound #1: Same as Pre-Procedure Plan Follow-up Appointments: Return Appointment in 1 week. Dressing Change Frequency: Do not change entire dressing for one week. Skin Barriers/Peri-Wound Care: Moisturizing lotion -  mixed with TCA to leg TCA Cream or Ointment Wound Cleansing: May shower with protection. Primary Wound Dressing: Wound #1 Right,Lateral Lower Leg: Silver Collagen - moisten with hydrogel Secondary Dressing: Wound #1 Right,Lateral Lower Leg: Dry Gauze Drawtex - cut to fit inside wound edges Edema Control: Wound #1 Right,Lateral Lower Leg: 3 Layer Compression System - Right Lower Extremity - apply unna boot first layer to upper portion of lower leg to aid in securing the compression wrap. use kerlix instead of cotton for contact layer Avoid standing for long periods of time Elevate legs to the level of the heart or above for 30 minutes daily and/or when sitting, a frequency of: - throughout the day. Exercise regularly The following medication(s) was prescribed: benzocaine topical 20 % aerosol aerosol topical for  prior to debridement was prescribed at facility 1. My suggestion at this time is can be that we go ahead and initiate a continuation of treatment with the collagen followed by drawtex to fill in the space. Subsequently were also utilizing a 3 layer compression wrap. 2. I am also can recommend he continue to elevate his legs much as possible to help with edema control. 3. I do think he can continue to be as active as possible and do not see any needs for limitation at this point. We will see patient back for reevaluation in 1 week here in the clinic. If anything worsens or changes patient will contact our office for additional recommendations. Electronic Signature(s) Signed: 09/02/2019 12:36:47 PM By: Worthy Keeler PA-C Entered By: Worthy Keeler on 09/02/2019 12:36:47 -------------------------------------------------------------------------------- SuperBill Details Patient Name: Date of Service: [name], [name] 09/02/2019 Medical Record Number: [id number] Patient Account Number: [id number] Date of Birth/Sex: Treating RN: January 02, 1966 (54 y.o. Ulyses Amor, Vaughan Basta Primary Care  Provider: Precious Haws Other Clinician: Referring Provider: Treating Provider/Extender: [name], [name] in Treatment: 4 Diagnosis Coding ICD-10 Codes Code Description [phone] Abrasion, right lower leg, subsequent encounter L97.812 Non-pressure chronic ulcer of other part of right lower leg with fat layer exposed E11.622 Type 2 diabetes mellitus with other skin ulcer I89.0 Lymphedema, not elsewhere classified Facility Procedures CPT4 Code: [id number] Description: 4332 - DEB SUBQ TISSUE 20 SQ CM/< ICD-10 Diagnosis Description L97.812 Non-pressure chronic ulcer of other part of right lower leg with fat layer exp Modifier: osed Quantity: 1 Physician Procedures : CPT4 Code Description Modifier [id number] 11042 - WC PHYS SUBQ TISS 20 SQ CM ICD-10 Diagnosis Description Y60.630 Non-pressure chronic ulcer of other part of right lower leg with fat layer exposed Quantity: 1 Electronic Signature(s) Signed: 09/02/2019 12:36:53 PM By: Worthy Keeler PA-C Entered By: Worthy Keeler on 09/02/2019 12:36:53

## 2019-09-09 ENCOUNTER — Encounter (HOSPITAL_BASED_OUTPATIENT_CLINIC_OR_DEPARTMENT_OTHER): Payer: Medicare Other | Attending: Physician Assistant | Admitting: Physician Assistant

## 2019-09-09 ENCOUNTER — Other Ambulatory Visit: Payer: Self-pay

## 2019-09-09 DIAGNOSIS — L97812 Non-pressure chronic ulcer of other part of right lower leg with fat layer exposed: Secondary | ICD-10-CM | POA: Diagnosis not present

## 2019-09-09 DIAGNOSIS — E11622 Type 2 diabetes mellitus with other skin ulcer: Secondary | ICD-10-CM | POA: Insufficient documentation

## 2019-09-09 DIAGNOSIS — E1142 Type 2 diabetes mellitus with diabetic polyneuropathy: Secondary | ICD-10-CM | POA: Insufficient documentation

## 2019-09-09 DIAGNOSIS — E1151 Type 2 diabetes mellitus with diabetic peripheral angiopathy without gangrene: Secondary | ICD-10-CM | POA: Diagnosis not present

## 2019-09-09 DIAGNOSIS — G4730 Sleep apnea, unspecified: Secondary | ICD-10-CM | POA: Insufficient documentation

## 2019-09-09 DIAGNOSIS — I872 Venous insufficiency (chronic) (peripheral): Secondary | ICD-10-CM | POA: Insufficient documentation

## 2019-09-09 DIAGNOSIS — I890 Lymphedema, not elsewhere classified: Secondary | ICD-10-CM | POA: Insufficient documentation

## 2019-09-09 DIAGNOSIS — E1122 Type 2 diabetes mellitus with diabetic chronic kidney disease: Secondary | ICD-10-CM | POA: Diagnosis not present

## 2019-09-09 DIAGNOSIS — I130 Hypertensive heart and chronic kidney disease with heart failure and stage 1 through stage 4 chronic kidney disease, or unspecified chronic kidney disease: Secondary | ICD-10-CM | POA: Diagnosis not present

## 2019-09-09 DIAGNOSIS — I5032 Chronic diastolic (congestive) heart failure: Secondary | ICD-10-CM | POA: Insufficient documentation

## 2019-09-09 DIAGNOSIS — N184 Chronic kidney disease, stage 4 (severe): Secondary | ICD-10-CM | POA: Insufficient documentation

## 2019-09-09 DIAGNOSIS — I4891 Unspecified atrial fibrillation: Secondary | ICD-10-CM | POA: Diagnosis not present

## 2019-09-09 NOTE — Progress Notes (Addendum)
[name], [name] ([id number]) Visit Report for 09/09/2019 Chief Complaint Document Details Patient Name: Date of Service: [name] 09/09/2019 9:45 A M Medical Record Number: [id number] Patient Account Number: [id number] Date of Birth/Sex: Treating RN: October 20, 1965 (54 y.o. [name] Primary Care Provider: Precious [name] Other Clinician: Referring Provider: Treating Provider/Extender: [name], [name] in Treatment: 5 Information Obtained from: Patient Chief Complaint 08/04/2019; patient is here for review of the wound on the right lateral lower leg Electronic Signature(s) Signed: 09/09/2019 10:44:25 AM By: Worthy Keeler PA-C Entered By: Worthy Keeler on 09/09/2019 10:44:25 -------------------------------------------------------------------------------- Debridement Details Patient Name: Date of Service: [name] 09/09/2019 9:45 A M Medical Record Number: [id number] Patient Account Number: [id number] Date of Birth/Sex: Treating RN: [birth year] (54 y.o. [name] Primary Care Provider: Precious [name] Other Clinician: Referring Provider: Treating Provider/Extender: [name], [name] in Treatment: 5 Debridement Performed for Assessment: Wound #1 Right,Lateral Lower Leg Performed By: Physician Worthy Keeler, PA Debridement Type: Debridement Severity of Tissue Pre Debridement: Fat layer exposed Level of Consciousness (Pre-procedure): Awake and Alert Pre-procedure Verification/Time Out Yes - 11:02 Taken: Start Time: 11:03 Pain Control: Other : benzocaine 20% spray T Area Debrided (L x W): otal 1.4 (cm) x 1.4 (cm) = 1.96 (cm) Tissue and other material debrided: Viable, Non-Viable, Slough, Subcutaneous, Skin: Epidermis, Fibrin/Exudate, Slough Level: Skin/Subcutaneous Tissue Debridement Description: Excisional Instrument: Curette Bleeding: Minimum Hemostasis Achieved: Pressure End Time: 11:05 Procedural Pain: 6 Post Procedural  Pain: 3 Response to Treatment: Procedure was tolerated well Level of Consciousness (Post- Awake and Alert procedure): Post Debridement Measurements of Total Wound Length: (cm) 1.4 Width: (cm) 1.4 Depth: (cm) 0.1 Volume: (cm) 0.154 Character of Wound/Ulcer Post Debridement: Improved Severity of Tissue Post Debridement: Fat layer exposed Post Procedure Diagnosis Same as Pre-procedure Electronic Signature(s) Signed: 09/09/2019 4:50:50 PM By: Worthy Keeler PA-C Signed: 09/09/2019 5:52:33 PM By: Baruch Gouty RN, BSN Entered By: Baruch Gouty on 09/09/2019 11:07:40 -------------------------------------------------------------------------------- HPI Details Patient Name: Date of Service: [name] 09/09/2019 9:45 A M Medical Record Number: [id number] Patient Account Number: [id number] Date of Birth/Sex: Treating RN: January 03, 1966 (54 y.o. [name] Primary Care Provider: Precious [name] Other Clinician: Referring Provider: Treating Provider/Extender: [name], [name] in Treatment: 5 History of Present Illness HPI Description: ADMISSION 08/04/2019 This is a 54 year old man who traumatized his right lateral lower leg while going [place] escal[place] at the mall sometime in December 2020. He was referred to vascular surgery and on 04/14/2019 he saw Dr. Wenda Overland. He was felt to have "palpable pulses and normal noninvasive studies" although I cannot see these results. He was back in his primary doctor's office on 07/30/2019 for a nonhealing right lower extremity wound. He was referred to wound care I think in [facility] but his insurance was out of network. A CNS was ordered. X-ray was done that was negative. He has been using wound cleanser and a Band-Aid. Past medical history is actually quite extensive and includes chronic kidney disease stage IV, chronic diastolic heart failure, atrial fibrillation, poorly controlled hypertension, poorly controlled diabetes with  peripheral neuropathy and a recent hemoglobin A1c of 11.5, depression, lymphedema and sleep apnea ABI in our clinic was 1.1 in the right 5/6; this wound was initially traumatized while the patient was going down on an escalator. Arrived in clinic last week with a completely nonviable surface. He required mechanical debridement and we have been using Iodoflex under  compression. Apparently the compression wraps fell down. He also has poorly controlled diabetes 5/15; traumatic/contusion wound to the right lateral lower leg. We have been using Iodoflex to clean at the surface of this which was completely nonviable along with mechanical debridements. He is under compression. 09/02/2019 upon evaluation today patient appears to be doing well with regard to his wound on his right lateral leg. He has been tolerating the dressing changes without complication. Fortunately there is no signs of active infection at this time. No fevers, chills, nausea, vomiting, or diarrhea. With that being said there is some necrotic tissue around the edges of the wound that is and require some sharp debridement today. That was discussed with the patient and his mother in the office today prior to debridement to which she did consent. 09/09/2019 upon evaluation today patient appears to be making progress in regard to his wound. Fortunately the wound is not nearly as deep as what it was. It is measuring a little bit larger due to having to clear some of the edges away but again that is necessary to get this to heal. Fortunately there is no evidence of active infection at this time. No fevers, chills, nausea, vomiting, or diarrhea. Electronic Signature(s) Signed: 09/09/2019 11:07:43 AM By: Worthy Keeler PA-C Entered By: Worthy Keeler on 09/09/2019 11:07:43 -------------------------------------------------------------------------------- Physical Exam Details Patient Name: Date of Service: [name] 09/09/2019 9:45 A  M Medical Record Number: [id number] Patient Account Number: [id number] Date of Birth/Sex: Treating RN: Oct 26, 1965 (54 y.o. [name] Primary Care Provider: Precious [name] Other Clinician: Referring Provider: Treating Provider/Extender: [name], [name] in Treatment: 5 Constitutional Well-nourished and well-hydrated in no acute distress. Respiratory normal breathing without difficulty. Psychiatric this patient is able to make decisions and demonstrates good insight into disease process. Alert and Oriented x 3. pleasant and cooperative. Notes Patient's wound bed currently showed signs of excellent granulation and he did have some necrotic tissue around the edges of the wound and a little bit on the surface around the edge but again I was able to sharply debride this away and again the edges looked much better post debridement than prior. Electronic Signature(s) Signed: 09/09/2019 11:07:59 AM By: Worthy Keeler PA-C Entered By: Worthy Keeler on 09/09/2019 11:07:59 -------------------------------------------------------------------------------- Physician Orders Details Patient Name: Date of Service: [name] 09/09/2019 9:45 A M Medical Record Number: [id number] Patient Account Number: [id number] Date of Birth/Sex: Treating RN: Aug 26, 1965 (54 y.o. [name] Primary Care Provider: Precious [name] Other Clinician: Referring Provider: Treating Provider/Extender: [name], [name] in Treatment: 5 Verbal / Phone Orders: No Diagnosis Coding ICD-10 Coding Code Description S80.811D Abrasion, right lower leg, subsequent encounter L97.812 Non-pressure chronic ulcer of other part of right lower leg with fat layer exposed E11.622 Type 2 diabetes mellitus with other skin ulcer I89.0 Lymphedema, not elsewhere classified Follow-up Appointments Return Appointment in 1 week. Dressing Change Frequency Do not change entire dressing for one week. Skin  Barriers/Peri-Wound Care Moisturizing lotion - mixed with TCA to leg TCA Cream or Ointment Wound Cleansing May shower with protection. Primary Wound Dressing Wound #1 Right,Lateral Lower Leg Silver Collagen - moisten with hydrogel Secondary Dressing Wound #1 Right,Lateral Lower Leg Dry Gauze Drawtex - cut to fit inside wound edges Edema Control Wound #1 Right,Lateral Lower Leg 3 Layer Compression System - Right Lower Extremity - apply unna boot first layer to upper portion of lower leg to aid in securing the  compression wrap. use kerlix instead of cotton for contact layer Avoid standing for long periods of time Elevate legs to the level of the heart or above for 30 minutes daily and/or when sitting, a frequency of: - throughout the day. Exercise regularly Electronic Signature(s) Signed: 09/09/2019 4:50:50 PM By: Worthy Keeler PA-C Signed: 09/09/2019 5:52:33 PM By: Baruch Gouty RN, BSN Entered By: Baruch Gouty on 09/09/2019 11:04:35 -------------------------------------------------------------------------------- Problem List Details Patient Name: Date of Service: [name] 09/09/2019 9:45 A M Medical Record Number: [id number] Patient Account Number: [id number] Date of Birth/Sex: Treating RN: January 11, 1966 (54 y.o. [name] Primary Care Provider: Precious [name] Other Clinician: Referring Provider: Treating Provider/Extender: [name], [name] in Treatment: 5 Active Problems ICD-10 Encounter Code Description Active Date MDM Diagnosis S80.811D Abrasion, right lower leg, subsequent encounter 08/04/2019 No Yes L97.812 Non-pressure chronic ulcer of other part of right lower leg with fat layer 08/04/2019 No Yes exposed E11.622 Type 2 diabetes mellitus with other skin ulcer 08/04/2019 No Yes I89.0 Lymphedema, not elsewhere classified 08/04/2019 No Yes Inactive Problems Resolved Problems Electronic Signature(s) Signed: 09/09/2019 10:44:16 AM By: Worthy Keeler PA-C Entered By: Worthy Keeler on 09/09/2019 10:44:15 -------------------------------------------------------------------------------- Progress Note Details Patient Name: Date of Service: [name] 09/09/2019 9:45 A M Medical Record Number: [id number] Patient Account Number: [id number] Date of Birth/Sex: Treating RN: 05-01-1965 (54 y.o. [name] Primary Care Provider: Precious [name] Other Clinician: Referring Provider: Treating Provider/Extender: [name], [name] in Treatment: 5 Subjective Chief Complaint Information obtained from Patient 08/04/2019; patient is here for review of the wound on the right lateral lower leg History of Present Illness (HPI) ADMISSION 08/04/2019 This is a 54 year old man who traumatized his right lateral lower leg while going [place] an [place] the mall sometime in December 2020. He was referred to vascular surgery and on 04/14/2019 he saw Dr. Wenda Overland. He was felt to have "palpable pulses and normal noninvasive studies" although I cannot see these results. He was back in his primary doctor's office on 07/30/2019 for a nonhealing right lower extremity wound. He was referred to wound care I think in [facility] but his insurance was out of network. A CNS was ordered. X-ray was done that was negative. He has been using wound cleanser and a Band-Aid. Past medical history is actually quite extensive and includes chronic kidney disease stage IV, chronic diastolic heart failure, atrial fibrillation, poorly controlled hypertension, poorly controlled diabetes with peripheral neuropathy and a recent hemoglobin A1c of 11.5, depression, lymphedema and sleep apnea ABI in our clinic was 1.1 in the right 5/6; this wound was initially traumatized while the patient was going down on an escalator. Arrived in clinic last week with a completely nonviable surface. He required mechanical debridement and we have been using Iodoflex under compression.  Apparently the compression wraps fell down. He also has poorly controlled diabetes 5/15; traumatic/contusion wound to the right lateral lower leg. We have been using Iodoflex to clean at the surface of this which was completely nonviable along with mechanical debridements. He is under compression. 09/02/2019 upon evaluation today patient appears to be doing well with regard to his wound on his right lateral leg. He has been tolerating the dressing changes without complication. Fortunately there is no signs of active infection at this time. No fevers, chills, nausea, vomiting, or diarrhea. With that being said there is some necrotic tissue around the edges of the wound that is and  require some sharp debridement today. That was discussed with the patient and his mother in the office today prior to debridement to which she did consent. 09/09/2019 upon evaluation today patient appears to be making progress in regard to his wound. Fortunately the wound is not nearly as deep as what it was. It is measuring a little bit larger due to having to clear some of the edges away but again that is necessary to get this to heal. Fortunately there is no evidence of active infection at this time. No fevers, chills, nausea, vomiting, or diarrhea. Objective Constitutional Well-nourished and well-hydrated in no acute distress. Vitals Time Taken: 10:05 AM, Height: 66 in, Weight: 256 lbs, BMI: 41.3, Temperature: 97.6 F, Pulse: 76 bpm, Respiratory Rate: 20 breaths/min, Blood Pressure: 135/88 mmHg. Respiratory normal breathing without difficulty. Psychiatric this patient is able to make decisions and demonstrates good insight into disease process. Alert and Oriented x 3. pleasant and cooperative. General Notes: Patient's wound bed currently showed signs of excellent granulation and he did have some necrotic tissue around the edges of the wound and a little bit on the surface around the edge but again I was able to  sharply debride this away and again the edges looked much better post debridement than prior. Integumentary (Hair, Skin) Wound #1 status is Open. Original cause of wound was Trauma. The wound is located on the Right,Lateral Lower Leg. The wound measures 1.4cm length x 1.4cm width x 0.2cm depth; 1.539cm^2 area and 0.308cm^3 volume. There is Fat Layer (Subcutaneous Tissue) Exposed exposed. There is no tunneling or undermining noted. There is a small amount of serosanguineous drainage noted. The wound margin is distinct with the outline attached to the wound base. There is large (67-100%) pink granulation within the wound bed. There is no necrotic tissue within the wound bed. 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 There was a Excisional Skin/Subcutaneous Tissue Debridement with a total area of 1.96 sq cm performed by Worthy Keeler, PA. With the following instrument(s): Curette to remove Viable and Non-Viable tissue/material. Material removed includes Subcutaneous Tissue, Slough, Skin: Epidermis, and Fibrin/Exudate after achieving pain control using Other (benzocaine 20% spray). No specimens were taken. A time out was conducted at 11:02, prior to the start of the procedure. A Minimum amount of bleeding was controlled with Pressure. The procedure was tolerated well with a pain level of 6 throughout and a pain level of 3 following the procedure. Post Debridement Measurements: 1.4cm length x 1.4cm width x 0.1cm depth; 0.154cm^3 volume. Character of Wound/Ulcer Post Debridement is improved.  Severity of Tissue Post Debridement is: Fat layer exposed. Post procedure Diagnosis Wound #1: Same as Pre-Procedure Pre-procedure diagnosis of Wound #1 is a Diabetic Wound/Ulcer of the Lower Extremity located on the Right,Lateral Lower Leg . There was a Three Layer Compression Therapy Procedure by [name], RN. Post procedure Diagnosis Wound #1: Same as Pre-Procedure Plan Follow-up Appointments: Return Appointment in 1 week. Dressing Change Frequency: Do not change entire dressing for one week. Skin Barriers/Peri-Wound Care: Moisturizing lotion - mixed with TCA to leg TCA Cream or Ointment Wound Cleansing: May shower with protection. Primary Wound Dressing:  Wound #1 Right,Lateral Lower Leg: Silver Collagen - moisten with hydrogel Secondary Dressing: Wound #1 Right,Lateral Lower Leg: Dry Gauze Drawtex - cut to fit inside wound edges Edema Control: Wound #1 Right,Lateral Lower Leg: 3 Layer Compression System - Right Lower Extremity - apply unna boot first layer to upper portion of lower leg to aid in securing the compression wrap. use kerlix instead of cotton for contact layer Avoid standing for long periods of time Elevate legs to the level of the heart or above for 30 minutes daily and/or when sitting, a frequency of: - throughout the day. Exercise regularly 1. I would recommend currently that we go ahead and initiate treatment with continuation of the silver collagen followed by the drawtex which has done well for him. 2. We will also continue with a 3 layer compression wrap as I feel like that is beneficial for the patient as well. 3. I would recommend he continue to elevate his legs as much as possible to try to keep the edema under good control. We will see patient back for reevaluation in 1 week here in the clinic. If anything worsens or changes patient will contact our office for additional recommendations. Electronic Signature(s) Signed: 09/09/2019 11:09:22 AM By: Worthy Keeler PA-C Entered By: Worthy Keeler on 09/09/2019 11:09:21 -------------------------------------------------------------------------------- SuperBill Details Patient Name: Date of Service: [name] 09/09/2019 Medical Record Number: [id number] Patient Account Number: [id number] Date of Birth/Sex: Treating RN: [birth year] (54 y.o. Ulyses Amor, Vaughan Basta Primary Care Provider: Precious [name] Other Clinician: Referring Provider: Treating Provider/Extender: [name], [name] in Treatment: 5 Diagnosis Coding ICD-10 Codes Code Description [phone] Abrasion, right lower leg, subsequent encounter L97.812 Non-pressure chronic ulcer of other part of right lower leg with fat layer exposed E11.622 Type 2 diabetes mellitus with other skin ulcer I89.0 Lymphedema, not elsewhere classified Facility Procedures CPT4 Code: [id number] Description: 42353 - DEB SUBQ TISSUE 20 SQ CM/< ICD-10 Diagnosis Description L97.812 Non-pressure chronic ulcer of other part of right lower leg with fat layer exp Modifier: osed Quantity: 1 Physician Procedures : CPT4 Code Description Modifier [id number] 11042 - WC PHYS SUBQ TISS 20 SQ CM ICD-10 Diagnosis Description Q00.867 Non-pressure chronic ulcer of other part of right lower leg with fat layer exposed Quantity: 1 Electronic Signature(s) Signed: 09/09/2019 11:09:37 AM By: Worthy Keeler PA-C Entered By: Worthy Keeler on 09/09/2019 11:09:36

## 2019-09-09 NOTE — Progress Notes (Signed)
Nack, TEO ([id number]) Visit Report for 09/09/2019 Arrival Information Details Patient Name: Date of Service: [name], [name] 09/09/2019 9:45 A M Medical Record Number: [id number] Patient Account Number: [id number] Date of Birth/Sex: Treating RN: 1965-08-24 (54 y.o. [name], [name] Primary Care Tori Dattilio: Precious Haws Other Clinician: Referring Giulliana Mcroberts: Treating Signa Cheek/Extender: [name], [name] in Treatment: 5 Visit Information History Since Last Visit Added or deleted any medications: No Patient Arrived: Ambulatory Any new allergies or adverse reactions: No Arrival Time: 10:05 Had a fall or experienced change in No Accompanied By: self activities of daily living that may affect Transfer Assistance: None risk of falls: Patient Identification Verified: Yes Signs or symptoms of abuse/neglect since last visito No Secondary Verification Process Completed: Yes Hospitalized since last visit: No Patient Requires Transmission-Based Precautions: No Implantable device outside of the clinic excluding No Patient Has Alerts: No cellular tissue based products placed in the center since last visit: Has Dressing in Place as Prescribed: Yes Has Compression in Place as Prescribed: No Pain Present Now: No Electronic Signature(s) Signed: 09/09/2019 5:37:32 PM By: Deon Pilling Entered By: Deon Pilling on 09/09/2019 10:09:37 -------------------------------------------------------------------------------- Compression Therapy Details Patient Name: Date of Service: [name], [name] 09/09/2019 9:45 A M Medical Record Number: [id number] Patient Account Number: [id number] Date of Birth/Sex: Treating RN: 1965-10-31 (54 y.o. [name] Primary Care Melana Hingle: Precious Haws Other Clinician: Referring Yasira Engelson: Treating Azzan Butler/Extender: [name], [name] in Treatment: 5 Compression Therapy Performed for Wound Assessment: Wound #1 Right,Lateral Lower  Leg Performed By: Clinician [name], RN Compression Type: Three Layer Post Procedure Diagnosis Same as Pre-procedure Electronic Signature(s) Signed: 09/09/2019 5:52:33 PM By: Baruch Gouty RN, BSN Entered By: Baruch Gouty on 09/09/2019 11:03:19 -------------------------------------------------------------------------------- Encounter Discharge Information Details Patient Name: Date of Service: [name], [name] 09/09/2019 9:45 A M Medical Record Number: [id number] Patient Account Number: [id number] Date of Birth/Sex: Treating RN: July 15, 1965 (53 y.o. [name]) [name] Primary Care Kennede Lusk: Precious Haws Other Clinician: Referring Abdirahman Chittum: Treating Shalaina Guardiola/Extender: [name], [name] in Treatment: 5 Encounter Discharge Information Items Post Procedure Vitals Discharge Condition: Stable Temperature (F): 97.6 Ambulatory Status: Ambulatory Pulse (bpm): 76 Discharge Destination: Home Respiratory Rate (breaths/min): 20 Transportation: Private Auto Blood Pressure (mmHg): 135/88 Accompanied By: self Schedule Follow-up Appointment: Yes Clinical Summary of Care: Patient Declined Electronic Signature(s) Signed: 09/09/2019 4:36:43 PM By: [name] RN Entered By: [name] on 09/09/2019 11:33:44 -------------------------------------------------------------------------------- Lower Extremity Assessment Details Patient Name: Date of Service: [name], [name] 09/09/2019 9:45 A M Medical Record Number: [id number] Patient Account Number: [id number] Date of Birth/Sex: Treating RN: July 16, 1965 (54 y.o. [name] Primary Care Kinsey Karch: Precious Haws Other Clinician: Referring Lelaina Oatis: Treating Havoc Sanluis/Extender: [name], [name] in Treatment: 5 Edema Assessment Assessed: [Left: No] [Right: Yes] Edema: [Left: Ye] [Right: s] Calf Left: Right: Point of Measurement: 31 cm From Medial Instep cm 44.2 cm Ankle Left: Right: Point of Measurement: 8  cm From Medial Instep cm 23.3 cm Vascular Assessment Pulses: Dorsalis Pedis Palpable: [Right:Yes] Electronic Signature(s) Signed: 09/09/2019 5:37:32 PM By: Deon Pilling Entered By: Deon Pilling on 09/09/2019 10:10:26 -------------------------------------------------------------------------------- Kennard Details Patient Name: Date of Service: [name], [name] 09/09/2019 9:45 A M Medical Record Number: [id number] Patient Account Number: [id number] Date of Birth/Sex: Treating RN: 02/27/1966 (54 y.o. [name] Primary Care Caley Volkert: Precious Haws Other Clinician: Referring Corliss Lamartina: Treating Reino Lybbert/Extender: [name], [name] in Treatment: 5 Active Inactive Venous Leg  Ulcer Nursing Diagnoses: Knowledge deficit related to disease process and management Potential for venous Insuffiency (use before diagnosis confirmed) Goals: Patient will maintain optimal edema control Date Initiated: 09/09/2019 Target Resolution Date: 10/07/2019 Goal Status: Active Patient/caregiver will verbalize understanding of disease process and disease management Date Initiated: 09/09/2019 Target Resolution Date: 10/07/2019 Goal Status: Active Interventions: Assess peripheral edema status every visit. Compression as ordered Provide education on venous insufficiency Treatment Activities: Therapeutic compression applied : 09/09/2019 Notes: Wound/Skin Impairment Nursing Diagnoses: Knowledge deficit related to ulceration/compromised skin integrity Goals: Patient/caregiver will verbalize understanding of skin care regimen Date Initiated: 08/04/2019 Target Resolution Date: 10/07/2019 Goal Status: Active Ulcer/skin breakdown will have a volume reduction of 30% by week 4 Date Initiated: 08/04/2019 Date Inactivated: 09/09/2019 Target Resolution Date: 09/11/2019 Goal Status: Met Ulcer/skin breakdown will have a volume reduction of 50% by week 8 Date Initiated:  09/09/2019 Target Resolution Date: 10/07/2019 Goal Status: Active Interventions: Assess patient/caregiver ability to obtain necessary supplies Assess patient/caregiver ability to perform ulcer/skin care regimen upon admission and as needed Assess ulceration(s) every visit Notes: Electronic Signature(s) Signed: 09/09/2019 5:52:33 PM By: Baruch Gouty RN, BSN Entered By: Baruch Gouty on 09/09/2019 11:00:19 -------------------------------------------------------------------------------- Pain Assessment Details Patient Name: Date of Service: [name], [name] 09/09/2019 9:45 A M Medical Record Number: [id number] Patient Account Number: [id number] Date of Birth/Sex: Treating RN: Oct 11, 1965 (55 y.o. [name] Primary Care Sheray Grist: Precious Haws Other Clinician: Referring Estefana Taylor: Treating Samiah Ricklefs/Extender: [name], [name] in Treatment: 5 Active Problems Location of Pain Severity and Description of Pain Patient Has Paino No Site Locations Rate the pain. Current Pain Level: 0 Worst Pain Level: 10 Least Pain Level: 0 Tolerable Pain Level: 8 Pain Management and Medication Current Pain Management: Medication: No Cold Application: No Rest: No Massage: No Activity: No T.E.N.S.: No Heat Application: No Leg drop or elevation: No Is the Current Pain Management Adequate: Adequate How does your wound impact your activities of daily livingo Sleep: No Bathing: No Appetite: No Relationship With Others: No Bladder Continence: No Emotions: No Bowel Continence: No Work: No Toileting: No Drive: No Dressing: No Hobbies: No Electronic Signature(s) Signed: 09/09/2019 5:37:32 PM By: Deon Pilling Entered By: Deon Pilling on 09/09/2019 10:10:13 -------------------------------------------------------------------------------- Patient/Caregiver Education Details Patient Name: Date of Service: [name], [name] 6/2/2021andnbsp9:45 A M Medical Record Number:  [id number] Patient Account Number: [id number] Date of Birth/Gender: Treating RN: 02/11/1966 (54 y.o. [name] Primary Care Physician: Precious Haws Other Clinician: Referring Physician: Treating Physician/Extender: Burnard Hawthorne in Treatment: 5 Education Assessment Education Provided To: Patient Education Topics Provided Venous: Methods: Explain/Verbal Responses: Reinforcements needed, State content correctly Wound/Skin Impairment: Methods: Explain/Verbal Responses: Reinforcements needed, State content correctly Electronic Signature(s) Signed: 09/09/2019 5:52:33 PM By: Baruch Gouty RN, BSN Entered By: Baruch Gouty on 09/09/2019 11:00:41 -------------------------------------------------------------------------------- Wound Assessment Details Patient Name: Date of Service: [name], [name] 09/09/2019 9:45 A M Medical Record Number: [id number] Patient Account Number: [id number] Date of Birth/Sex: Treating RN: Jul 10, 1965 (54 y.o. [name] Primary Care Temitope Flammer: Precious Haws Other Clinician: Referring Cruise Baumgardner: Treating Moo Gravley/Extender: [name], [name] in Treatment: 5 Wound Status Wound Number: 1 Primary Diabetic Wound/Ulcer of the Lower Extremity Etiology: Wound Location: Right, Lateral Lower Leg Wound Open Wounding Event: Trauma Status: Date Acquired: 04/10/2019 Comorbid Lymphedema, Sleep Apnea, Arrhythmia, Congestive Heart Failure, Weeks Of Treatment: 5 History: Hypertension, Peripheral Arterial Disease, Type II Diabetes Clustered Wound: No Wound Measurements Length: (cm) 1.4 Width: (cm) 1.4 Depth: (cm)  0.2 Area: (cm) 1.539 Volume: (cm) 0.308 % Reduction in Area: -78.1% % Reduction in Volume: -78% Epithelialization: Small (1-33%) Tunneling: No Undermining: No Wound Description Classification: Grade 2 Wound Margin: Distinct, outline attached Exudate Amount: Small Exudate Type: Serosanguineous Exudate  Color: red, brown Foul Odor After Cleansing: No Slough/Fibrino No Wound Bed Granulation Amount: Large (67-100%) Exposed Structure Granulation Quality: Pink Fascia Exposed: No Necrotic Amount: None Present (0%) Fat Layer (Subcutaneous Tissue) Exposed: Yes Tendon Exposed: No Muscle Exposed: No Joint Exposed: No Bone Exposed: No Treatment Notes Wound #1 (Right, Lateral Lower Leg) 1. Cleanse With Wound Cleanser Soap and water 2. Periwound Care Moisturizing lotion TCA Cream 3. Primary Dressing Applied Collegen AG Hydrogel or K-Y Jelly 4. Secondary Dressing Dry Gauze 6. Support Layer Applied 3 layer compression wrap Notes netting. Electronic Signature(s) Signed: 09/09/2019 5:37:32 PM By: Deon Pilling Entered By: Deon Pilling on 09/09/2019 10:10:48 -------------------------------------------------------------------------------- Vitals Details Patient Name: Date of Service: [name], [name] 09/09/2019 9:45 A M Medical Record Number: [id number] Patient Account Number: [id number] Date of Birth/Sex: Treating RN: 10-19-1965 (54 y.o. [name] Primary Care Elantra Caprara: Precious Haws Other Clinician: Referring Simonne Boulos: Treating Milly Goggins/Extender: [name], [name] in Treatment: 5 Vital Signs Time Taken: 10:05 Temperature (F): 97.6 Height (in): 66 Pulse (bpm): 76 Weight (lbs): 256 Respiratory Rate (breaths/min): 20 Body Mass Index (BMI): 41.3 Blood Pressure (mmHg): 135/88 Reference Range: 80 - 120 mg / dl Electronic Signature(s) Signed: 09/09/2019 5:37:32 PM By: Deon Pilling Entered By: Deon Pilling on 09/09/2019 10:09:52

## 2019-09-16 ENCOUNTER — Encounter (HOSPITAL_BASED_OUTPATIENT_CLINIC_OR_DEPARTMENT_OTHER): Payer: Medicare Other | Admitting: Physician Assistant

## 2019-09-16 DIAGNOSIS — E11622 Type 2 diabetes mellitus with other skin ulcer: Secondary | ICD-10-CM | POA: Diagnosis not present

## 2019-09-16 NOTE — Progress Notes (Addendum)
Vinton, JAYCUB ([id number]) Visit Report for 09/16/2019 Chief Complaint Document Details Patient Name: Date of Service: [name], [name] 09/16/2019 8:30 A M Medical Record Number: [id number] Patient Account Number: [id number] Date of Birth/Sex: Treating RN: [birth year] (54 y.o. [name] Primary Care Provider: Precious Haws Other Clinician: Referring Provider: Treating Provider/Extender: [name], [name] in Treatment: 6 Information Obtained from: Patient Chief Complaint 08/04/2019; patient is here for review of the wound on the right lateral lower leg Electronic Signature(s) Signed: 09/16/2019 8:57:34 AM By: Worthy Keeler PA-C Entered By: Worthy Keeler on 09/16/2019 08:57:34 -------------------------------------------------------------------------------- HPI Details Patient Name: Date of Service: [name], [name] 09/16/2019 8:30 A M Medical Record Number: [id number] Patient Account Number: [id number] Date of Birth/Sex: Treating RN: 09-09-1965 (54 y.o. [name] Primary Care Provider: Precious Haws Other Clinician: Referring Provider: Treating Provider/Extender: [name], [name] in Treatment: 6 History of Present Illness HPI Description: ADMISSION 08/04/2019 This is a 54 year old man who traumatized his right lateral lower leg while going [place] an escal[place] at the mall sometime in December 2020. He was referred to vascular surgery and on 04/14/2019 he saw Dr. Wenda Overland. He was felt to have "palpable pulses and normal noninvasive studies" although I cannot see these results. He was back in his primary doctor's office on 07/30/2019 for a nonhealing right lower extremity wound. He was referred to wound care I think in [facility] but his insurance was out of network. A CNS was ordered. X-ray was done that was negative. He has been using wound cleanser and a Band-Aid. Past medical history is actually quite extensive and includes chronic kidney disease  stage IV, chronic diastolic heart failure, atrial fibrillation, poorly controlled hypertension, poorly controlled diabetes with peripheral neuropathy and a recent hemoglobin A1c of 11.5, depression, lymphedema and sleep apnea ABI in our clinic was 1.1 in the right 5/6; this wound was initially traumatized while the patient was going down on an escalator. Arrived in clinic last week with a completely nonviable surface. He required mechanical debridement and we have been using Iodoflex under compression. Apparently the compression wraps fell down. He also has poorly controlled diabetes 5/15; traumatic/contusion wound to the right lateral lower leg. We have been using Iodoflex to clean at the surface of this which was completely nonviable along with mechanical debridements. He is under compression. 09/02/2019 upon evaluation today patient appears to be doing well with regard to his wound on his right lateral leg. He has been tolerating the dressing changes without complication. Fortunately there is no signs of active infection at this time. No fevers, chills, nausea, vomiting, or diarrhea. With that being said there is some necrotic tissue around the edges of the wound that is and require some sharp debridement today. That was discussed with the patient and his mother in the office today prior to debridement to which she did consent. 09/09/2019 upon evaluation today patient appears to be making progress in regard to his wound. Fortunately the wound is not nearly as deep as what it was. It is measuring a little bit larger due to having to clear some of the edges away but again that is necessary to get this to heal. Fortunately there is no evidence of active infection at this time. No fevers, chills, nausea, vomiting, or diarrhea. 09/16/2019 on evaluation today patient appears to be doing excellent in regard to his wound currently. In fact I do not see anything that we can need to debride  today which is great  news. The wound bed looks healthy and the edges of the wound look like they are doing great. Overall I feel like we are at a good place and headed in the proper direction. Electronic Signature(s) Signed: 09/16/2019 9:04:37 AM By: Worthy Keeler PA-C Entered By: Worthy Keeler on 09/16/2019 09:04:37 -------------------------------------------------------------------------------- Physical Exam Details Patient Name: Date of Service: [name], [name] 09/16/2019 8:30 A M Medical Record Number: [id number] Patient Account Number: [id number] Date of Birth/Sex: Treating RN: 1965-11-18 (54 y.o. [name] Primary Care Provider: Precious Haws Other Clinician: Referring Provider: Treating Provider/Extender: [name], [name] in Treatment: 6 Constitutional Well-nourished and well-hydrated in no acute distress. Respiratory normal breathing without difficulty. Psychiatric this patient is able to make decisions and demonstrates good insight into disease process. Alert and Oriented x 3. pleasant and cooperative. Notes Patient's wound today did not require any sharp debridement which is great news there does not appear to be any signs of active infection and overall very pleased with where things stand. The patient fortunately states that although normally though debridement hurts he does not have a lot of pain in between. Electronic Signature(s) Signed: 09/16/2019 9:04:57 AM By: Worthy Keeler PA-C Entered By: Worthy Keeler on 09/16/2019 09:04:56 -------------------------------------------------------------------------------- Physician Orders Details Patient Name: Date of Service: [name], [name] 09/16/2019 8:30 A M Medical Record Number: [id number] Patient Account Number: [id number] Date of Birth/Sex: Treating RN: 27-Feb-1966 (54 y.o. 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 Dressing Change Frequency Do not change entire dressing for one week. Skin Barriers/Peri-Wound Care Barrier cream - to periwound Moisturizing lotion - mixed with TCA to leg TCA Cream or Ointment Wound Cleansing May shower with protection. Primary Wound Dressing Wound #1 Right,Lateral Lower Leg Silver Collagen - moisten with hydrogel Secondary Dressing Wound #1 Right,Lateral Lower Leg Dry Gauze Zetuvit or Kerramax Drawtex - cut to fit inside wound edges Edema Control Wound #1 Right,Lateral Lower Leg 3 Layer Compression System - Right Lower Extremity - apply unna boot first layer to upper portion of lower leg to aid in securing the compression wrap. Avoid standing for long periods of time Elevate legs to the level of the heart or above for 30 minutes daily and/or when sitting, a frequency of: - throughout the day. Exercise regularly Electronic Signature(s) Signed: 09/16/2019 6:08:32 PM By: Baruch Gouty RN, BSN Signed: 09/16/2019 6:29:31 PM By: Worthy Keeler PA-C Entered By: Baruch Gouty on 09/16/2019 09:04:11 -------------------------------------------------------------------------------- Problem List Details Patient Name: Date of Service: [name], [name] 09/16/2019 8:30 A M Medical Record Number: [id number] Patient Account Number: [id number] Date of Birth/Sex: Treating RN: [birth year] (54 y.o. [name] Primary Care Provider: Precious Haws Other Clinician: Referring Provider: Treating Provider/Extender: [name], [name] in Treatment: 6 Active  Problems ICD-10 Encounter Code Description Active Date MDM Diagnosis S80.811D Abrasion, right lower leg, subsequent encounter 08/04/2019 No Yes L97.812 Non-pressure chronic ulcer of other part of right lower leg with fat layer 08/04/2019 No Yes exposed E11.622 Type 2 diabetes mellitus with other  skin ulcer 08/04/2019 No Yes I89.0 Lymphedema, not elsewhere classified 08/04/2019 No Yes Inactive Problems Resolved Problems Electronic Signature(s) Signed: 09/16/2019 8:57:02 AM By: Worthy Keeler PA-C Entered By: Worthy Keeler on 09/16/2019 08:57:02 -------------------------------------------------------------------------------- Progress Note Details Patient Name: Date of Service: [name], [name] 09/16/2019 8:30 A M Medical Record Number: [id number] Patient Account Number: [id number] Date of Birth/Sex: Treating RN: [birth year] (54 y.o. [name] Primary Care Provider: Precious Haws Other Clinician: Referring Provider: Treating Provider/Extender: [name], [name] in Treatment: 6 Subjective Chief Complaint Information obtained from Patient 08/04/2019; patient is here for review of the wound on the right lateral lower leg History of Present Illness (HPI) ADMISSION 08/04/2019 This is a 54 year old man who traumatized his right lateral lower leg while going up an escalator at the mall sometime in December 2020. He was referred to vascular surgery and on 04/14/2019 he saw Dr. Wenda Overland. He was felt to have "palpable pulses and normal noninvasive studies" although I cannot see these results. He was back in his primary doctor's office on 07/30/2019 for a nonhealing right lower extremity wound. He was referred to wound care I think in [facility] but his insurance was out of network. A CNS was ordered. X-ray was done that was negative. He has been using wound cleanser and a Band-Aid. Past medical history is actually quite extensive and includes chronic kidney disease stage IV,  chronic diastolic heart failure, atrial fibrillation, poorly controlled hypertension, poorly controlled diabetes with peripheral neuropathy and a recent hemoglobin A1c of 11.5, depression, lymphedema and sleep apnea ABI in our clinic was 1.1 in the right 5/6; this wound was initially traumatized while the patient was going down on an escalator. Arrived in clinic last week with a completely nonviable surface. He required mechanical debridement and we have been using Iodoflex under compression. Apparently the compression wraps fell down. He also has poorly controlled diabetes 5/15; traumatic/contusion wound to the right lateral lower leg. We have been using Iodoflex to clean at the surface of this which was completely nonviable along with mechanical debridements. He is under compression. 09/02/2019 upon evaluation today patient appears to be doing well with regard to his wound on his right lateral leg. He has been tolerating the dressing changes without complication. Fortunately there is no signs of active infection at this time. No fevers, chills, nausea, vomiting, or diarrhea. With that being said there is some necrotic tissue around the edges of the wound that is and require some sharp debridement today. That was discussed with the patient and his mother in the office today prior to debridement to which she did consent. 09/09/2019 upon evaluation today patient appears to be making progress in regard to his wound. Fortunately the wound is not nearly as deep as what it was. It is measuring a little bit larger due to having to clear some of the edges away but again that is necessary to get this to heal. Fortunately there is no evidence of active infection at this time. No fevers, chills, nausea, vomiting, or diarrhea. 09/16/2019 on evaluation today patient appears to be doing excellent in regard to his wound currently. In fact I do not see anything that we can need to debride today which is great news. The  wound bed looks healthy and the edges of the wound look like they are doing great. Overall I feel like we are at a good place and headed in the proper direction. Objective Constitutional Well-nourished and well-hydrated in  no acute distress. Vitals Time Taken: 8:35 AM, Height: 66 in, Weight: 256 lbs, BMI: 41.3, Temperature: 97.7 F, Pulse: 77 bpm, Respiratory Rate: 18 breaths/min, Blood Pressure: 145/90 mmHg, Capillary Blood Glucose: 173 mg/dl. Respiratory normal breathing without difficulty. Psychiatric this patient is able to make decisions and demonstrates good insight into disease process. Alert and Oriented x 3. pleasant and cooperative. General Notes: Patient's wound today did not require any sharp debridement which is great news there does not appear to be any signs of active infection and overall very pleased with where things stand. The patient fortunately states that although normally though debridement hurts he does not have a lot of pain in between. Integumentary (Hair, Skin) Wound #1 status is Open. Original cause of wound was Trauma. The wound is located on the Right,Lateral Lower Leg. The wound measures 2cm length x 2.2cm width x 0.2cm depth; 3.456cm^2 area and 0.691cm^3 volume. There is Fat Layer (Subcutaneous Tissue) Exposed exposed. There is no tunneling or undermining noted. There is a medium amount of serosanguineous drainage noted. The wound margin is distinct with the outline attached to the wound base. There is large (67-100%) pink granulation within the wound bed. There is a small (1-33%) amount of necrotic tissue within the wound bed including Adherent Slough. 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 There was a Three Layer Compression Therapy Procedure by [name], RN. Post procedure Diagnosis Wound #1: Same as Pre-Procedure Plan Follow-up Appointments: Return Appointment in 1 week. Dressing Change Frequency: Do not change entire dressing for one week. Skin Barriers/Peri-Wound Care: Barrier cream - to periwound Moisturizing lotion - mixed with TCA to leg TCA Cream or Ointment Wound Cleansing: May shower with protection. Primary Wound Dressing: Wound #1 Right,Lateral Lower Leg: Silver Collagen - moisten with hydrogel Secondary Dressing: Wound #1 Right,Lateral Lower Leg: Dry Gauze Zetuvit or Kerramax Drawtex - cut to fit inside wound edges Edema Control: Wound #1 Right,Lateral Lower Leg: 3 Layer Compression System - Right Lower Extremity - apply unna boot first layer to upper portion of lower leg to aid in securing the compression wrap. Avoid standing for long periods of time Elevate legs to the level of the heart or above for 30 minutes daily and/or when sitting, a frequency of: - throughout the day. Exercise regularly 1. I would recommend currently that we go ahead and continue with the wound care measures as before. The patient is in agreement with the plan and to be honest I think he is doing excellent today compared to the previous 2 weeks that I saw on the edges of the wound appear to be doing great and no debridement was even needed. 2. With regard to compression I would recommend that we continue with a 3 layer compression wrap I think that is doing a great job. 3. I would also recommend that we continue with the drawtex over top I think that is helping to control the moisture which is good in preventing hyper granulation if that changes we may switch to [facility]. We will see patient back for reevaluation in 1 week here in the clinic. If anything worsens or changes patient  will contact our office for additional recommendations. Electronic  Signature(s) Signed: 09/16/2019 9:05:48 AM By: Worthy Keeler PA-C Signed: 09/16/2019 9:05:48 AM By: Worthy Keeler PA-C Entered By: Worthy Keeler on 09/16/2019 09:05:48 -------------------------------------------------------------------------------- SuperBill Details Patient Name: Date of Service: [name], [name] 09/16/2019 Medical Record Number: [id number] Patient Account Number: [id number] Date of Birth/Sex: Treating RN: 10/12/1965 (54 y.o. 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 2 diabetes mellitus with other skin ulcer I89.0 Lymphedema, not elsewhere classified Facility Procedures CPT4 Code: [id number] Description: (Facility Use Only) [phone] - [facility] LWR RT LEG Modifier: Quantity: 1 Physician Procedures : CPT4 Code Description Modifier [id number] - WC PHYS LEVEL 3 - EST PT ICD-10 Diagnosis Description S80.811D Abrasion, right lower leg, subsequent encounter L97.812 Non-pressure chronic ulcer of other part of right lower leg with fat layer exposed  E11.622 Type 2 diabetes mellitus with other skin ulcer I89.0 Lymphedema, not elsewhere classified Quantity: 1 Electronic Signature(s) Signed: 09/16/2019 9:06:01 AM By: Worthy Keeler PA-C Entered By: Worthy Keeler on 09/16/2019 09:06:00

## 2019-09-17 NOTE — Progress Notes (Signed)
[name] ([id number]) Visit Report for 09/16/2019 Arrival Information Details Patient Name: Date of Service: [name], [name] 09/16/2019 8:30 A M Medical Record Number: [id number] Patient Account Number: [id number] Date of Birth/Sex: Treating RN: 06-09-1965 (54 y.o. [name], [name] Primary Care Hanalei Glace: [name] Other Clinician: Referring Mason Burleigh: Treating [name]/Extender: [name], [name] in Treatment: 6 Visit Information History Since Last Visit Added or deleted any medications: No Patient Arrived: Ambulatory Any new allergies or adverse reactions: No Arrival Time: 08:35 Had a fall or experienced change in No Accompanied By: self activities of daily living that may affect Transfer Assistance: None risk of falls: Patient Identification Verified: Yes Signs or symptoms of abuse/neglect since last visito No Secondary Verification Process Completed: Yes Hospitalized since last visit: No Patient Requires Transmission-Based Precautions: No Implantable device outside of the clinic excluding No Patient Has Alerts: No cellular tissue based products placed in the center since last visit: Has Dressing in Place as Prescribed: Yes Has Compression in Place as Prescribed: No Pain Present Now: No Electronic Signature(s) Signed: 09/16/2019 5:57:14 PM By: Deon Pilling Entered By: Deon Pilling on 09/16/2019 08:40:36 -------------------------------------------------------------------------------- Compression Therapy Details Patient Name: Date of Service: [name], [name] 09/16/2019 8:30 A M Medical Record Number: [id number] Patient Account Number: [id number] Date of Birth/Sex: Treating RN: 02/19/1966 (54 y.o. [name] Primary Care Nelda Luckey: [name] Other Clinician: Referring Devarious Pavek: Treating [name]/Extender: [name], [name] in Treatment: 6 Compression Therapy Performed for Wound Assessment: Wound #1 Right,Lateral Lower  Leg Performed By: Clinician [name], RN Compression Type: Three Layer Post Procedure Diagnosis Same as Pre-procedure Electronic Signature(s) Signed: 09/16/2019 6:08:32 PM By: Baruch Gouty RN, BSN Entered By: Baruch Gouty on 09/16/2019 09:02:40 -------------------------------------------------------------------------------- Encounter Discharge Information Details Patient Name: Date of Service: [name], [name] 09/16/2019 8:30 A M Medical Record Number: [id number] Patient Account Number: [id number] Date of Birth/Sex: Treating RN: 07/25/1965 (54 y.o. [name] Primary Care Brycen Bean: [name] Other Clinician: Referring Latrelle Fuston: Treating Mayra Brahm/Extender: [name], [name] in Treatment: 6 Encounter Discharge Information Items Discharge Condition: Stable Ambulatory Status: Ambulatory Discharge Destination: Home Transportation: Private Auto Accompanied By: alone Schedule Follow-up Appointment: Yes Clinical Summary of Care: Patient Declined Electronic Signature(s) Signed: 09/17/2019 5:18:16 PM By: Levan Hurst RN, BSN Entered By: Levan Hurst on 09/16/2019 14:17:36 -------------------------------------------------------------------------------- Lower Extremity Assessment Details Patient Name: Date of Service: [name], [name] 09/16/2019 8:30 A M Medical Record Number: [id number] Patient Account Number: [id number] Date of Birth/Sex: Treating RN: 09-22-1965 (54 y.o. [name] Primary Care Jada Fass: [name] Other Clinician: Referring Maris Abascal: Treating Erie Sica/Extender: [name], [name] in Treatment: 6 Edema Assessment Assessed: [Left: No] [Right: Yes] Edema: [Left: Ye] [Right: s] Calf Left: Right: Point of Measurement: 31 cm From Medial Instep cm 42 cm Ankle Left: Right: Point of Measurement: 8 cm From Medial Instep cm 24 cm Vascular Assessment Pulses: Dorsalis Pedis Palpable: [Right:Yes] Electronic  Signature(s) Signed: 09/16/2019 5:57:14 PM By: Deon Pilling Entered By: Deon Pilling on 09/16/2019 08:41:53 -------------------------------------------------------------------------------- [place] Details Patient Name: Date of Service: [name], [name] 09/16/2019 8:30 A M Medical Record Number: [id number] Patient Account Number: [id number] Date of Birth/Sex: Treating RN: [birth year] (54 y.o. [name] Primary Care [name]: [name] Other Clinician: Referring Tiara Maultsby: Treating [name]/Extender: [name], [name] in Treatment: 6 Active Inactive Venous Leg Ulcer Nursing Diagnoses: Knowledge deficit related to disease process and management Potential for venous Insuffiency (use  before diagnosis confirmed) Goals: Patient will maintain optimal edema control Date Initiated: 09/09/2019 Target Resolution Date: 10/07/2019 Goal Status: Active Patient/caregiver will verbalize understanding of disease process and disease management Date Initiated: 09/09/2019 Target Resolution Date: 10/07/2019 Goal Status: Active Interventions: Assess peripheral edema status every visit. Compression as ordered Provide education on venous insufficiency Treatment Activities: Therapeutic compression applied : 09/09/2019 Notes: Wound/Skin Impairment Nursing Diagnoses: Knowledge deficit related to ulceration/compromised skin integrity Goals: Patient/caregiver will verbalize understanding of skin care regimen Date Initiated: 08/04/2019 Target Resolution Date: 10/07/2019 Goal Status: Active Ulcer/skin breakdown will have a volume reduction of 30% by week 4 Date Initiated: 08/04/2019 Date Inactivated: 09/09/2019 Target Resolution Date: 09/11/2019 Goal Status: Met Ulcer/skin breakdown will have a volume reduction of 50% by week 8 Date Initiated: 09/09/2019 Target Resolution Date: 10/07/2019 Goal Status: Active Interventions: Assess patient/caregiver ability to obtain  necessary supplies Assess patient/caregiver ability to perform ulcer/skin care regimen upon admission and as needed Assess ulceration(s) every visit Notes: Electronic Signature(s) Signed: 09/16/2019 6:08:32 PM By: Baruch Gouty RN, BSN Entered By: Baruch Gouty on 09/16/2019 09:02:01 -------------------------------------------------------------------------------- Pain Assessment Details Patient Name: Date of Service: [name], [name] 09/16/2019 8:30 A M Medical Record Number: [id number] Patient Account Number: [id number] Date of Birth/Sex: Treating RN: 03-31-1966 (54 y.o. [name] Primary Care Daquane Aguilar: [name] Other Clinician: Referring Meeya Goldin: Treating Carolyn Maniscalco/Extender: [name], [name] in Treatment: 6 Active Problems Location of Pain Severity and Description of Pain Patient Has Paino No Site Locations Rate the pain. Current Pain Level: 0 Pain Management and Medication Current Pain Management: Medication: No Cold Application: No Rest: No Massage: No Activity: No T.E.N.S.: No Heat Application: No Leg drop or elevation: No Is the Current Pain Management Adequate: Adequate How does your wound impact your activities of daily livingo Sleep: No Bathing: No Appetite: No Relationship With Others: No Bladder Continence: No Emotions: No Bowel Continence: No Work: No Toileting: No Drive: No Dressing: No Hobbies: No Electronic Signature(s) Signed: 09/16/2019 5:57:14 PM By: Deon Pilling Entered By: Deon Pilling on 09/16/2019 08:41:09 -------------------------------------------------------------------------------- Patient/Caregiver Education Details Patient Name: Date of Service: [name], [name] 6/9/2021andnbsp8:30 A M Medical Record Number: [id number] Patient Account Number: [id number] Date of Birth/Gender: Treating RN: 10-18-1965 (54 y.o. [name] Primary Care Physician: [name] Other Clinician: Referring  Physician: Treating Physician/Extender: Burnard Hawthorne in Treatment: 6 Education Assessment Education Provided To: Patient Education Topics Provided Venous: Methods: Explain/Verbal Responses: Reinforcements needed, State content correctly Electronic Signature(s) Signed: 09/16/2019 6:08:32 PM By: Baruch Gouty RN, BSN Entered By: Baruch Gouty on 09/16/2019 09:02:16 -------------------------------------------------------------------------------- Wound Assessment Details Patient Name: Date of Service: [name], [name] 09/16/2019 8:30 A M Medical Record Number: [id number] Patient Account Number: [id number] Date of Birth/Sex: Treating RN: [birth year] (54 y.o. [name] Primary Care Liza Czerwinski: [name] Other Clinician: Referring Catie Chiao: Treating Elmin Wiederholt/Extender: [name], [name] in Treatment: 6 Wound Status Wound Number: 1 Primary Diabetic Wound/Ulcer of the Lower Extremity Etiology: Wound Location: Right, Lateral Lower Leg Wound Open Wounding Event: Trauma Status: Date Acquired: 04/10/2019 Comorbid Lymphedema, Sleep Apnea, Arrhythmia, Congestive Heart Failure, Weeks Of Treatment: 6 History: Hypertension, Peripheral Arterial Disease, Type II Diabetes Clustered Wound: No Photos Photo Uploaded By: Mikeal Hawthorne on 09/17/2019 13:38:14 Wound Measurements Length: (cm) 2 Width: (cm) 2.2 Depth: (cm) 0.2 Area: (cm) 3.456 Volume: (cm) 0.691 % Reduction in Area: -300% % Reduction in Volume: -299.4% Epithelialization: Small (1-33%) Tunneling: No Undermining: No Wound Description Classification: Grade 2  Wound Margin: Distinct, outline attached Exudate Amount: Medium Exudate Type: Serosanguineous Exudate Color: red, brown Foul Odor After Cleansing: No Slough/Fibrino Yes Wound Bed Granulation Amount: Large (67-100%) Exposed Structure Granulation Quality: Pink Fascia Exposed: No Necrotic Amount: Small (1-33%) Fat Layer  (Subcutaneous Tissue) Exposed: Yes Necrotic Quality: Adherent Slough Tendon Exposed: No Muscle Exposed: No Joint Exposed: No Bone Exposed: No Treatment Notes Wound #1 (Right, Lateral Lower Leg) 1. Cleanse With Soap and water 2. Periwound Care Barrier cream Moisturizing lotion TCA Cream 3. Primary Dressing Applied Collegen AG 4. Secondary Dressing Dry Gauze Kerramax/Xtrasorb Drawtex 6. Support Layer Applied 3 layer compression wrap Notes netting. Electronic Signature(s) Signed: 09/16/2019 5:57:14 PM By: Deon Pilling Entered By: Deon Pilling on 09/16/2019 08:42:49 -------------------------------------------------------------------------------- Vitals Details Patient Name: Date of Service: [name], [name] 09/16/2019 8:30 A M Medical Record Number: [id number] Patient Account Number: [id number] Date of Birth/Sex: Treating RN: [birth year] (54 y.o. [name] Primary Care Marshawn Normoyle: [name] Other Clinician: Referring Renton Berkley: Treating Jayleah Garbers/Extender: [name], [name] in Treatment: 6 Vital Signs Time Taken: 08:35 Temperature (F): 97.7 Height (in): 66 Pulse (bpm): 77 Weight (lbs): 256 Respiratory Rate (breaths/min): 18 Body Mass Index (BMI): 41.3 Blood Pressure (mmHg): 145/90 Capillary Blood Glucose (mg/dl): 173 Reference Range: 80 - 120 mg / dl Electronic Signature(s) Signed: 09/16/2019 5:57:14 PM By: Deon Pilling Entered By: Deon Pilling on 09/16/2019 08:40:58

## 2019-09-23 ENCOUNTER — Other Ambulatory Visit: Payer: Self-pay

## 2019-09-23 ENCOUNTER — Encounter (HOSPITAL_BASED_OUTPATIENT_CLINIC_OR_DEPARTMENT_OTHER): Payer: Medicare Other | Admitting: Physician Assistant

## 2019-09-23 DIAGNOSIS — E11622 Type 2 diabetes mellitus with other skin ulcer: Secondary | ICD-10-CM | POA: Diagnosis not present

## 2019-09-23 NOTE — Progress Notes (Addendum)
[name], [name] ([id number]) Visit Report for 09/23/2019 Chief Complaint Document Details Patient Name: Date of Service: [name] 09/23/2019 10:15 A M Medical Record Number: [id number] Patient Account Number: [id number] Date of Birth/Sex: Treating RN: Nov 08, 1965 (54 y.o. [name] Primary Care Provider: Precious Haws Other Clinician: Referring Provider: Treating Provider/Extender: [name], [name] in Treatment: 7 Information Obtained from: Patient Chief Complaint 08/04/2019; patient is here for review of the wound on the right lateral lower leg Electronic Signature(s) Signed: 09/23/2019 10:26:21 AM By: Worthy Keeler PA-C Entered By: Worthy Keeler on 09/23/2019 10:26:20 -------------------------------------------------------------------------------- HPI Details Patient Name: Date of Service: [name] 09/23/2019 10:15 A M Medical Record Number: [id number] Patient Account Number: [id number] Date of Birth/Sex: Treating RN: 08/19/1965 (54 y.o. [name] Primary Care Provider: Precious Haws Other Clinician: Referring Provider: Treating Provider/Extender: [name], [name] in Treatment: 7 History of Present Illness HPI Description: ADMISSION 08/04/2019 This is a 54 year old man who traumatized his right lateral lower leg while going [place] an escalator at the mall sometime in December 2020. He was referred to vascular surgery and on 04/14/2019 he saw Dr. Wenda Overland. He was felt to have "palpable pulses and normal noninvasive studies" although I cannot see these results. He was back in his primary doctor's office on 07/30/2019 for a nonhealing right lower extremity wound. He was referred to wound care I think in [facility] but his insurance was out of network. A CNS was ordered. X-ray was done that was negative. He has been using wound cleanser and a Band-Aid. Past medical history is actually quite extensive and includes chronic kidney  disease stage IV, chronic diastolic heart failure, atrial fibrillation, poorly controlled hypertension, poorly controlled diabetes with peripheral neuropathy and a recent hemoglobin A1c of 11.5, depression, lymphedema and sleep apnea ABI in our clinic was 1.1 in the right 5/6; this wound was initially traumatized while the patient was going down on an escalator. Arrived in clinic last week with a completely nonviable surface. He required mechanical debridement and we have been using Iodoflex under compression. Apparently the compression wraps fell down. He also has poorly controlled diabetes 5/15; traumatic/contusion wound to the right lateral lower leg. We have been using Iodoflex to clean at the surface of this which was completely nonviable along with mechanical debridements. He is under compression. 09/02/2019 upon evaluation today patient appears to be doing well with regard to his wound on his right lateral leg. He has been tolerating the dressing changes without complication. Fortunately there is no signs of active infection at this time. No fevers, chills, nausea, vomiting, or diarrhea. With that being said there is some necrotic tissue around the edges of the wound that is and require some sharp debridement today. That was discussed with the patient and his mother in the office today prior to debridement to which she did consent. 09/09/2019 upon evaluation today patient appears to be making progress in regard to his wound. Fortunately the wound is not nearly as deep as what it was. It is measuring a little bit larger due to having to clear some of the edges away but again that is necessary to get this to heal. Fortunately there is no evidence of active infection at this time. No fevers, chills, nausea, vomiting, or diarrhea. 09/16/2019 on evaluation today patient appears to be doing excellent in regard to his wound currently. In fact I do not see anything that we can need to debride  today which  is great news. The wound bed looks healthy and the edges of the wound look like they are doing great. Overall I feel like we are at a good place and headed in the proper direction. 09/23/2019 upon evaluation today patient appears to be doing okay in regard to his wound. This is not measuring any smaller it is about the same. With that being said he tells me that his wrap actually slid down he ended up having to take this off he was on a trip and did not have his Velcro compression with him. With that being said I do not see any signs of anything significantly worsening which is good news. Electronic Signature(s) Signed: 09/23/2019 11:31:15 AM By: Worthy Keeler PA-C Entered By: Worthy Keeler on 09/23/2019 11:31:14 -------------------------------------------------------------------------------- Physical Exam Details Patient Name: Date of Service: [name] 09/23/2019 10:15 A M Medical Record Number: [id number] Patient Account Number: [id number] Date of Birth/Sex: Treating RN: 1965-06-08 (54 y.o. [name] Primary Care Provider: Precious Haws Other Clinician: Referring Provider: Treating Provider/Extender: [name], [name] in Treatment: 7 Constitutional Well-nourished and well-hydrated in no acute distress. Respiratory normal breathing without difficulty. Psychiatric this patient is able to make decisions and demonstrates good insight into disease process. Alert and Oriented x 3. pleasant and cooperative. Notes Upon inspection patient's wound bed actually showed signs of good granulation at this time there does not appear to be any evidence of active infection which is great news and overall very pleased with what we are seeing. Electronic Signature(s) Signed: 09/23/2019 11:31:35 AM By: Worthy Keeler PA-C Entered By: Worthy Keeler on 09/23/2019 11:31:34 -------------------------------------------------------------------------------- Physician Orders  Details Patient Name: Date of Service: [name] 09/23/2019 10:15 A M Medical Record Number: [id number] Patient Account Number: [id number] Date of Birth/Sex: Treating RN: 02/21/1966 (54 y.o. [name] Primary Care Provider: Precious Haws Other Clinician: Referring Provider: Treating Provider/Extender: [name], [name] in Treatment: 7 Verbal / Phone Orders: No Diagnosis Coding ICD-10 Coding Code Description S80.811D Abrasion, right lower leg, subsequent encounter L97.812 Non-pressure chronic ulcer of other part of right lower leg with fat layer exposed E11.622 Type 2 diabetes mellitus with other skin ulcer I89.0 Lymphedema, not elsewhere classified Follow-up Appointments Return Appointment in 1 week. Dressing Change Frequency Do not change entire dressing for one week. Skin Barriers/Peri-Wound Care Barrier cream - to periwound Moisturizing lotion Wound Cleansing May shower with protection. Primary Wound Dressing Wound #1 Right,Lateral Lower Leg Silver Collagen - moisten with hydrogel Secondary Dressing Wound #1 Right,Lateral Lower Leg Dry Gauze ABD pad Edema Control Wound #1 Right,Lateral Lower Leg 3 Layer Compression System - Right Lower Extremity - apply unna boot first layer to upper portion of lower leg to aid in securing the compression wrap. Avoid standing for long periods of time Elevate legs to the level of the heart or above for 30 minutes daily and/or when sitting, a frequency of: - throughout the day. Exercise regularly Electronic Signature(s) Signed: 09/24/2019 1:39:37 PM By: Worthy Keeler PA-C Signed: 09/24/2019 5:22:52 PM By: Baruch Gouty RN, BSN Entered By: Baruch Gouty on 09/23/2019 11:30:18 -------------------------------------------------------------------------------- Problem List Details Patient Name: Date of Service: [name] 09/23/2019 10:15 A M Medical Record Number: [id number] Patient Account Number:  [id number] Date of Birth/Sex: Treating RN: 07/18/1965 (54 y.o. [name] Primary Care Provider: Precious Haws Other Clinician: Referring Provider: Treating Provider/Extender: [name], [name] in Treatment:  7 Active Problems ICD-10 Encounter Code Description Active Date MDM Diagnosis S80.811D Abrasion, right lower leg, subsequent encounter 08/04/2019 No Yes L97.812 Non-pressure chronic ulcer of other part of right lower leg with fat layer 08/04/2019 No Yes exposed E11.622 Type 2 diabetes mellitus with other skin ulcer 08/04/2019 No Yes I89.0 Lymphedema, not elsewhere classified 08/04/2019 No Yes Inactive Problems Resolved Problems Electronic Signature(s) Signed: 09/23/2019 10:26:15 AM By: Worthy Keeler PA-C Entered By: Worthy Keeler on 09/23/2019 10:26:15 -------------------------------------------------------------------------------- Progress Note Details Patient Name: Date of Service: [name] 09/23/2019 10:15 A M Medical Record Number: [id number] Patient Account Number: [id number] Date of Birth/Sex: Treating RN: September 12, 1965 (54 y.o. [name] Primary Care Provider: Precious Haws Other Clinician: Referring Provider: Treating Provider/Extender: [name], [name] in Treatment: 7 Subjective Chief Complaint Information obtained from Patient 08/04/2019; patient is here for review of the wound on the right lateral lower leg History of Present Illness (HPI) ADMISSION 08/04/2019 This is a 54 year old man who traumatized his right lateral lower leg while going up an escalator at the mall sometime in December 2020. He was referred to vascular surgery and on 04/14/2019 he saw Dr. Wenda Overland. He was felt to have "palpable pulses and normal noninvasive studies" although I cannot see these results. He was back in his primary doctor's office on 07/30/2019 for a nonhealing right lower extremity wound. He was referred to wound care I think in  [facility] but his insurance was out of network. A CNS was ordered. X-ray was done that was negative. He has been using wound cleanser and a Band-Aid. Past medical history is actually quite extensive and includes chronic kidney disease stage IV, chronic diastolic heart failure, atrial fibrillation, poorly controlled hypertension, poorly controlled diabetes with peripheral neuropathy and a recent hemoglobin A1c of 11.5, depression, lymphedema and sleep apnea ABI in our clinic was 1.1 in the right 5/6; this wound was initially traumatized while the patient was going down on an escalator. Arrived in clinic last week with a completely nonviable surface. He required mechanical debridement and we have been using Iodoflex under compression. Apparently the compression wraps fell down. He also has poorly controlled diabetes 5/15; traumatic/contusion wound to the right lateral lower leg. We have been using Iodoflex to clean at the surface of this which was completely nonviable along with mechanical debridements. He is under compression. 09/02/2019 upon evaluation today patient appears to be doing well with regard to his wound on his right lateral leg. He has been tolerating the dressing changes without complication. Fortunately there is no signs of active infection at this time. No fevers, chills, nausea, vomiting, or diarrhea. With that being said there is some necrotic tissue around the edges of the wound that is and require some sharp debridement today. That was discussed with the patient and his mother in the office today prior to debridement to which she did consent. 09/09/2019 upon evaluation today patient appears to be making progress in regard to his wound. Fortunately the wound is not nearly as deep as what it was. It is measuring a little bit larger due to having to clear some of the edges away but again that is necessary to get this to heal. Fortunately there is no evidence of active infection at this  time. No fevers, chills, nausea, vomiting, or diarrhea. 09/16/2019 on evaluation today patient appears to be doing excellent in regard to his wound currently. In fact I do not see anything that we can need  to debride today which is great news. The wound bed looks healthy and the edges of the wound look like they are doing great. Overall I feel like we are at a good place and headed in the proper direction. 09/23/2019 upon evaluation today patient appears to be doing okay in regard to his wound. This is not measuring any smaller it is about the same. With that being said he tells me that his wrap actually slid down he ended up having to take this off he was on a trip and did not have his Velcro compression with him. With that being said I do not see any signs of anything significantly worsening which is good news. Objective Constitutional Well-nourished and well-hydrated in no acute distress. Vitals Time Taken: 11:00 AM, Height: 66 in, Weight: 256 lbs, BMI: 41.3, Temperature: 98.1 F, Pulse: 77 bpm, Respiratory Rate: 20 breaths/min, Blood Pressure: 205/116 mmHg, Capillary Blood Glucose: 140 mg/dl. General Notes: per patient drove from out of town and has not taken oral BP medication. PA abd case manager made aware. first BP 209/125. rechecked BP several times and remains elevated. Patient denies any blurry vision, chest pain, dizziness, ringing in ears, or bloody noses. educated patient at length the importance of taking BP medication and closely monitoring BP. Respiratory normal breathing without difficulty. Psychiatric this patient is able to make decisions and demonstrates good insight into disease process. Alert and Oriented x 3. pleasant and cooperative. General Notes: Upon inspection patient's wound bed actually showed signs of good granulation at this time there does not appear to be any evidence of active infection which is great news and overall very pleased with what we are  seeing. Integumentary (Hair, Skin) Wound #1 status is Open. Original cause of wound was Trauma. The wound is located on the Right,Lateral Lower Leg. The wound measures 1.8cm length x 2.5cm width x 0.2cm depth; 3.534cm^2 area and 0.707cm^3 volume. There is Fat Layer (Subcutaneous Tissue) Exposed exposed. There is no tunneling or undermining noted. There is a medium amount of serosanguineous drainage noted. The wound margin is distinct with the outline attached to the wound base. There is medium (34-66%) pink granulation within the wound bed. There is a medium (34-66%) amount of necrotic tissue within the wound bed including Adherent Slough. 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 There was a Three Layer Compression Therapy Procedure by [name], RN. Post procedure Diagnosis Wound #1: Same as Pre-Procedure Plan Follow-up Appointments: Return Appointment in 1 week. Dressing Change Frequency: Do not change entire dressing for one week. Skin Barriers/Peri-Wound Care: Barrier cream - to periwound Moisturizing lotion Wound Cleansing: May shower with protection. Primary Wound Dressing: Wound #1 Right,Lateral Lower Leg: Silver Collagen - moisten with hydrogel Secondary Dressing: Wound #1 Right,Lateral Lower Leg: Dry Gauze ABD pad Edema Control: Wound #1 Right,Lateral Lower Leg: 3 Layer Compression System - Right Lower Extremity - apply unna boot first layer to upper portion of lower leg to aid in securing the compression wrap. Avoid standing for long periods of time Elevate legs to the level of the heart or above for 30 minutes daily  and/or when sitting, a frequency of: - throughout the day. Exercise regularly 1. I  would recommend that we continue with the collagen but discontinue the drawtex at this point I do not see need for that being that the wound is already somewhat dry. Fortunately there is no signs of infection which is good news. 2. I am also can recommend currently that the patient continue with the 3 layer compression wrap I think that still to be the best form as far as try to keep the edema under good control. 3. He also needs to continue to elevate his legs as much as possible that still of utmost importance. We will see patient back for reevaluation in 1 week here in the clinic. If anything worsens or changes patient will contact our office for additional recommendations. Electronic Signature(s) Signed: 09/23/2019 11:32:10 AM By: Worthy Keeler PA-C Entered By: Worthy Keeler on 09/23/2019 11:32:10 -------------------------------------------------------------------------------- SuperBill Details Patient Name: Date of Service: [name] 09/23/2019 Medical Record Number: [id number] Patient Account Number: [id number] Date of Birth/Sex: Treating RN: [birth year] (54 y.o. 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 diabetes mellitus with other skin ulcer I89.0 Lymphedema, not elsewhere classified Facility Procedures CPT4 Code: [id number] Description: (Facility Use Only) [phone] - [facility] LWR RT LEG Modifier: Quantity: 1 Physician Procedures : CPT4 Code Description Modifier [id number] - WC PHYS LEVEL 3 - EST PT ICD-10 Diagnosis Description S80.811D  Abrasion, right lower leg, subsequent encounter L97.812 Non-pressure chronic ulcer of other part of right lower leg with fat layer exposed  E11.622 Type 2 diabetes mellitus with other skin ulcer I89.0 Lymphedema, not elsewhere classified Quantity: 1 Electronic Signature(s) Signed: 09/23/2019 11:32:22 AM By: Worthy Keeler PA-C Entered By: Worthy Keeler on 09/23/2019 11:32:22

## 2019-09-24 NOTE — Progress Notes (Signed)
[name], [name] ([id number]) Visit Report for 09/23/2019 Arrival Information Details Patient Name: Date of Service: [name], [name] 09/23/2019 10:15 A M Medical Record Number: [id number] Patient Account Number: [id number] Date of Birth/Sex: Treating RN: 1965/08/05 (54 y.o. [name], [name] Primary Care [name]: [name] Other Clinician: Referring [name]: Treating [name]/Extender: [name], [name] in Treatment: 7 Visit Information History Since Last Visit Added or deleted any medications: No Patient Arrived: Ambulatory Any new allergies or adverse reactions: No Arrival Time: 10:55 Had a fall or experienced change in No Accompanied By: self activities of daily living that may affect Transfer Assistance: None risk of falls: Patient Identification Verified: Yes Signs or symptoms of abuse/neglect since last visito No Secondary Verification Process Completed: Yes Hospitalized since last visit: No Patient Requires Transmission-Based Precautions: No Implantable device outside of the clinic excluding No Patient Has Alerts: No cellular tissue based products placed in the center since last visit: Has Dressing in Place as Prescribed: Yes Has Compression in Place as Prescribed: No Pain Present Now: No Electronic Signature(s) Signed: 09/23/2019 5:03:23 PM By: [name] Entered By: [name] on 09/23/2019 10:59:19 -------------------------------------------------------------------------------- Compression Therapy Details Patient Name: Date of Service: [name], [name] 09/23/2019 10:15 A M Medical Record Number: [id number] Patient Account Number: [id number] Date of Birth/Sex: Treating RN: 05/23/1965 (54 y.o. [name] Primary Care [name]: [name] Other Clinician: Referring [name]: Treating [name]/Extender: [name], [name] in Treatment: 7 Compression Therapy Performed for Wound Assessment: Wound #1 Right,Lateral Lower  Leg Performed By: Clinician [name] Coria, RN Compression Type: Three Layer Post Procedure Diagnosis Same as Pre-procedure Electronic Signature(s) Signed: 09/24/2019 5:22:52 PM By: [name] Gouty RN, BSN Entered By: [name] on 09/23/2019 11:28:54 -------------------------------------------------------------------------------- Encounter Discharge Information Details Patient Name: Date of Service: [name], [name] 09/23/2019 10:15 A M Medical Record Number: [id number] Patient Account Number: [id number] Date of Birth/Sex: Treating RN: 11-15-1965 (53 y.o. [name] Primary Care [name]: [name] Other Clinician: Referring [name]: Treating [name]/Extender: [name] [name] in Treatment: 7 Encounter Discharge Information Items Discharge Condition: Stable Ambulatory Status: Ambulatory Discharge Destination: Home Transportation: Private Auto Accompanied By: self Schedule Follow-up Appointment: Yes Clinical Summary of Care: Patient Declined Electronic Signature(s) Signed: 09/23/2019 4:55:19 PM By: [name] Coria RN Entered By: [name] on 09/23/2019 12:01:24 -------------------------------------------------------------------------------- Lower Extremity Assessment Details Patient Name: Date of Service: [name], [name] 09/23/2019 10:15 A M Medical Record Number: [id number] Patient Account Number: [id number] Date of Birth/Sex: Treating RN: 1965/05/24 (54 y.o. [name] Primary Care [name]: [name] Other Clinician: Referring [name]: Treating [name]/Extender: [name], [name] in Treatment: 7 Edema Assessment Assessed: [Left: No] [Right: Yes] Edema: [Left: Ye] [Right: s] Calf Left: Right: Point of Measurement: 31 cm From Medial Instep cm 44.5 cm Ankle Left: Right: Point of Measurement: 8 cm From Medial Instep cm 25.5 cm Vascular Assessment Pulses: Dorsalis Pedis Palpable: [Right:Yes] Electronic  Signature(s) Signed: 09/23/2019 5:03:23 PM By: [name] Entered By: [name] on 09/23/2019 10:59:12 -------------------------------------------------------------------------------- [name] Details Patient Name: Date of Service: [name], [name] 09/23/2019 10:15 A M Medical Record Number: [id number] Patient Account Number: [id number] Date of Birth/Sex: Treating RN: 1966-03-25 (54 y.o. [name] Primary Care [name]: [name] Other Clinician: Referring Kareen Jefferys: Treating [name]/Extender: [name], [name] in Treatment: 7 Active Inactive Venous Leg Ulcer Nursing Diagnoses: Knowledge deficit related to disease process and management Potential for venous Insuffiency (use before  diagnosis confirmed) Goals: Patient will maintain optimal edema control Date Initiated: 09/09/2019 Target Resolution Date: 10/07/2019 Goal Status: Active Patient/caregiver will verbalize understanding of disease process and disease management Date Initiated: 09/09/2019 Target Resolution Date: 10/07/2019 Goal Status: Active Interventions: Assess peripheral edema status every visit. Compression as ordered Provide education on venous insufficiency Treatment Activities: Therapeutic compression applied : 09/09/2019 Notes: Wound/Skin Impairment Nursing Diagnoses: Knowledge deficit related to ulceration/compromised skin integrity Goals: Patient/caregiver will verbalize understanding of skin care regimen Date Initiated: 08/04/2019 Target Resolution Date: 10/07/2019 Goal Status: Active Ulcer/skin breakdown will have a volume reduction of 30% by week 4 Date Initiated: 08/04/2019 Date Inactivated: 09/09/2019 Target Resolution Date: 09/11/2019 Goal Status: Met Ulcer/skin breakdown will have a volume reduction of 50% by week 8 Date Initiated: 09/09/2019 Target Resolution Date: 10/07/2019 Goal Status: Active Interventions: Assess patient/caregiver ability to obtain  necessary supplies Assess patient/caregiver ability to perform ulcer/skin care regimen upon admission and as needed Assess ulceration(s) every visit Notes: Electronic Signature(s) Signed: 09/24/2019 5:22:52 PM By: [name] Gouty RN, BSN Entered By: [name] on 09/23/2019 11:26:07 -------------------------------------------------------------------------------- Pain Assessment Details Patient Name: Date of Service: [name], [name] 09/23/2019 10:15 A M Medical Record Number: [id number] Patient Account Number: [id number] Date of Birth/Sex: Treating RN: May 12, 1965 (54 y.o. [name] Primary Care Takari Duncombe: [name] Other Clinician: Referring Amie Cowens: Treating Lache Dagher/Extender: [name], [name] in Treatment: 7 Active Problems Location of Pain Severity and Description of Pain Patient Has Paino No Site Locations Rate the pain. Current Pain Level: 0 Pain Management and Medication Current Pain Management: Medication: No Cold Application: No Rest: No Massage: No Activity: No T.E.N.S.: No Heat Application: No Leg drop or elevation: No Is the Current Pain Management Adequate: Adequate How does your wound impact your activities of daily livingo Sleep: No Bathing: No Appetite: No Relationship With Others: No Bladder Continence: No Emotions: No Bowel Continence: No Work: No Toileting: No Drive: No Dressing: No Hobbies: No Electronic Signature(s) Signed: 09/23/2019 5:03:23 PM By: [name] Entered By: [name] on 09/23/2019 10:58:21 -------------------------------------------------------------------------------- Patient/Caregiver Education Details Patient Name: Date of Service: [name], [name] 6/16/2021andnbsp10:15 A M Medical Record Number: [id number] Patient Account Number: [id number] Date of Birth/Gender: Treating RN: 1965/10/24 (54 y.o. [name] Primary Care Physician: [name] Other Clinician: Referring  Physician: Treating Physician/Extender: Burnard Hawthorne in Treatment: 7 Education Assessment Education Provided To: Patient Education Topics Provided Venous: Methods: Explain/Verbal Responses: Reinforcements needed, State content correctly Wound/Skin Impairment: Methods: Explain/Verbal Responses: Reinforcements needed, State content correctly Electronic Signature(s) Signed: 09/24/2019 5:22:52 PM By: [name] Gouty RN, BSN Entered By: [name] on 09/23/2019 11:26:36 -------------------------------------------------------------------------------- Wound Assessment Details Patient Name: Date of Service: [name], [name] 09/23/2019 10:15 A M Medical Record Number: [id number] Patient Account Number: [id number] Date of Birth/Sex: Treating RN: 05-04-1965 (54 y.o. [name] Primary Care Shaquayla Klimas: [name] Other Clinician: Referring Penda Venturi: Treating Sarabeth Benton/Extender: [name], [name] in Treatment: 7 Wound Status Wound Number: 1 Primary Diabetic Wound/Ulcer of the Lower Extremity Etiology: Wound Location: Right, Lateral Lower Leg Wound Open Wounding Event: Trauma Status: Date Acquired: 04/10/2019 Comorbid Lymphedema, Sleep Apnea, Arrhythmia, Congestive Heart Failure, [name] Of Treatment: 7 History: Hypertension, Peripheral Arterial Disease, Type II Diabetes Clustered Wound: No Photos Photo Uploaded By: Mikeal Hawthorne on 09/23/2019 14:07:42 Wound Measurements Length: (cm) 1.8 Width: (cm) 2.5 Depth: (cm) 0.2 Area: (cm) 3.534 Volume: (cm) 0.707 % Reduction in Area: -309% % Reduction in Volume: -308.7% Epithelialization: Small (1-33%)  Tunneling: No Undermining: No Wound Description Classification: Grade 2 Wound Margin: Distinct, outline attached Exudate Amount: Medium Exudate Type: Serosanguineous Exudate Color: red, brown Wound Bed Granulation Amount: Medium (34-66%) Granulation Quality: Pink Necrotic Amount: Medium  (34-66%) Necrotic Quality: Adherent Slough Foul Odor After Cleansing: No Slough/Fibrino Yes Exposed Structure Fascia Exposed: No Fat Layer (Subcutaneous Tissue) Exposed: Yes Tendon Exposed: No Muscle Exposed: No Joint Exposed: No Bone Exposed: No Treatment Notes Wound #1 (Right, Lateral Lower Leg) 1. Cleanse With Wound Cleanser Soap and water 2. Periwound Care Barrier cream 3. Primary Dressing Applied Collegen AG Hydrogel or K-Y Jelly 4. Secondary Dressing Dry Gauze 6. Support Layer Applied 3 layer compression wrap Notes netting. Electronic Signature(s) Signed: 09/23/2019 5:03:23 PM By: [name] Entered By: [name] on 09/23/2019 11:01:53 -------------------------------------------------------------------------------- Vitals Details Patient Name: Date of Service: [name], [name] 09/23/2019 10:15 A M Medical Record Number: [id number] Patient Account Number: [id number] Date of Birth/Sex: Treating RN: [birth year] (54 y.o. [name] Primary Care Shahmeer Bunn: [name] Other Clinician: Referring Tang[place]ika Bowlds: Treating Mikkel Charrette/Extender: [name], [name] in Treatment: 7 Vital Signs Time Taken: 11:00 Temperature (F): 98.1 Height (in): 66 Pulse (bpm): 77 Weight (lbs): 256 Respiratory Rate (breaths/min): 20 Body Mass Index (BMI): 41.3 Blood Pressure (mmHg): 205/116 Capillary Blood Glucose (mg/dl): 140 Reference Range: 80 - 120 mg / dl Notes per patient drove from out of town and has not taken oral BP medication. PA abd case manager made aware. first BP 209/125. rechecked BP several times and remains elevated. Patient denies any blurry vision, chest pain, dizziness, ringing in ears, or bloody noses. educated patient at length the importance of taking BP medication and closely monitoring BP. Electronic Signature(s) Signed: 09/23/2019 5:03:23 PM By: [name] Entered By: [name] on 09/23/2019 11:06:23

## 2019-09-30 ENCOUNTER — Other Ambulatory Visit: Payer: Self-pay

## 2019-09-30 ENCOUNTER — Encounter (HOSPITAL_BASED_OUTPATIENT_CLINIC_OR_DEPARTMENT_OTHER): Payer: Medicare Other | Admitting: Physician Assistant

## 2019-09-30 DIAGNOSIS — E11622 Type 2 diabetes mellitus with other skin ulcer: Secondary | ICD-10-CM | POA: Diagnosis not present

## 2019-09-30 NOTE — Progress Notes (Addendum)
Misner, ARISTEO ([id number]) Visit Report for 09/30/2019 Chief Complaint Document Details Patient Name: Date of Service: [name], [name] 09/30/2019 8:30 A M Medical Record Number: [id number] Patient Account Number: [id number] Date of Birth/Sex: Treating RN: 18-Apr-1965 (54 y.o. [name] Primary Care Provider: Precious Haws Other Clinician: Referring Provider: Treating Provider/Extender: [name], [name] in Treatment: 8 Information Obtained from: Patient Chief Complaint 08/04/2019; patient is here for review of the wound on the right lateral lower leg Electronic Signature(s) Signed: 09/30/2019 8:55:45 AM By: Worthy Keeler PA-C Entered By: Worthy Keeler on 09/30/2019 08:55:45 -------------------------------------------------------------------------------- HPI Details Patient Name: Date of Service: [name], [name] 09/30/2019 8:30 A M Medical Record Number: [id number] Patient Account Number: [id number] Date of Birth/Sex: Treating RN: September 16, 1965 (54 y.o. [name] Primary Care Provider: Precious Haws Other Clinician: Referring Provider: Treating Provider/Extender: [name], [name] in Treatment: 8 History of Present Illness HPI Description: ADMISSION 08/04/2019 This is a 54 year old man who traumatized his right lateral lower leg while going [place] an escalator at the mall sometime in December 2020. He was referred to vascular surgery and on 04/14/2019 he saw Dr. Wenda Overland. He was felt to have "palpable pulses and normal noninvasive studies" although I cannot see these results. He was back in his primary doctor's office on 07/30/2019 for a nonhealing right lower extremity wound. He was referred to wound care I think in [facility] but his insurance was out of network. A CNS was ordered. X-ray was done that was negative. He has been using wound cleanser and a Band-Aid. Past medical history is actually quite extensive and includes chronic kidney disease  stage IV, chronic diastolic heart failure, atrial fibrillation, poorly controlled hypertension, poorly controlled diabetes with peripheral neuropathy and a recent hemoglobin A1c of 11.5, depression, lymphedema and sleep apnea ABI in our clinic was 1.1 in the right 5/6; this wound was initially traumatized while the patient was going down on an escalator. Arrived in clinic last week with a completely nonviable surface. He required mechanical debridement and we have been using Iodoflex under compression. Apparently the compression wraps fell down. He also has poorly controlled diabetes 5/15; traumatic/contusion wound to the right lateral lower leg. We have been using Iodoflex to clean at the surface of this which was completely nonviable along with mechanical debridements. He is under compression. 09/02/2019 upon evaluation today patient appears to be doing well with regard to his wound on his right lateral leg. He has been tolerating the dressing changes without complication. Fortunately there is no signs of active infection at this time. No fevers, chills, nausea, vomiting, or diarrhea. With that being said there is some necrotic tissue around the edges of the wound that is and require some sharp debridement today. That was discussed with the patient and his mother in the office today prior to debridement to which she did consent. 09/09/2019 upon evaluation today patient appears to be making progress in regard to his wound. Fortunately the wound is not nearly as deep as what it was. It is measuring a little bit larger due to having to clear some of the edges away but again that is necessary to get this to heal. Fortunately there is no evidence of active infection at this time. No fevers, chills, nausea, vomiting, or diarrhea. 09/16/2019 on evaluation today patient appears to be doing excellent in regard to his wound currently. In fact I do not see anything that we can need to debride  today which is great  news. The wound bed looks healthy and the edges of the wound look like they are doing great. Overall I feel like we are at a good place and headed in the proper direction. 09/23/2019 upon evaluation today patient appears to be doing okay in regard to his wound. This is not measuring any smaller it is about the same. With that being said he tells me that his wrap actually slid down he ended up having to take this off he was on a trip and did not have his Velcro compression with him. With that being said I do not see any signs of anything significantly worsening which is good news. 09/30/2019 upon evaluation today patient appears to be doing a little bit better in regard to his leg ulcer. He has been tolerating the dressing changes without complication. Fortunately there is no signs of active infection at this time. No fevers, chills, nausea, vomiting, or diarrhea. Electronic Signature(s) Signed: 09/30/2019 9:51:54 AM By: Worthy Keeler PA-C Entered By: Worthy Keeler on 09/30/2019 09:51:54 -------------------------------------------------------------------------------- Physical Exam Details Patient Name: Date of Service: [name], [name] 09/30/2019 8:30 A M Medical Record Number: [id number] Patient Account Number: [id number] Date of Birth/Sex: Treating RN: 01/17/1966 (54 y.o. [name] Primary Care Provider: Precious Haws Other Clinician: Referring Provider: Treating Provider/Extender: [name], [name] in Treatment: 8 Constitutional Well-nourished and well-hydrated in no acute distress. Respiratory normal breathing without difficulty. Psychiatric this patient is able to make decisions and demonstrates good insight into disease process. Alert and Oriented x 3. pleasant and cooperative. Notes Upon inspection patient's wound bed actually showed signs of excellent granulation at this time. There does not appear to be any evidence of active infection overall very pleased  with where things stand today. Electronic Signature(s) Signed: 09/30/2019 9:52:17 AM By: Worthy Keeler PA-C Entered By: Worthy Keeler on 09/30/2019 09:52:17 -------------------------------------------------------------------------------- Physician Orders Details Patient Name: Date of Service: [name], [name] 09/30/2019 8:30 A M Medical Record Number: [id number] Patient Account Number: [id number] Date of Birth/Sex: Treating RN: Aug 22, 1965 (55 y.o. 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 Dressing Change Frequency Do not change entire dressing for one week. Skin Barriers/Peri-Wound Care Barrier cream - to periwound Moisturizing lotion Wound Cleansing May shower with protection. Primary Wound Dressing Wound #1 Right,Lateral Lower Leg Silver Collagen - moisten with hydrogel Secondary Dressing Wound #1 Right,Lateral Lower Leg Dry Gauze ABD pad Edema Control Wound #1 Right,Lateral Lower Leg Unna Boot to Right Lower Extremity Avoid standing for long periods of time Elevate legs to the level of the heart or above for 30 minutes daily and/or when sitting, a frequency of: - throughout the day. Exercise regularly Services and Therapies Venous reflux Studies -Bilateral - bilateral lower extremity edema with nonhealing ulcer right lateral lower leg - (ICD10 L97.812 - Non-pressure chronic ulcer of other part of right lower leg with fat layer exposed) Electronic Signature(s) Signed:  09/30/2019 4:25:30 PM By: Baruch Gouty RN, BSN Signed: 10/02/2019 3:47:20 PM By: Worthy Keeler PA-C Entered By: Baruch Gouty on  09/30/2019 09:48:17 Prescription 09/30/2019 -------------------------------------------------------------------------------- Hieronymus, Levora Angel PA Patient Name: Provider: 1965-07-10 [id number] Date of Birth: NPI#: [name] [id number] Sex: DEA #: [phone] Phone #: License #: [place] Patient Address: [place] [address] [place], [place] [address] [place], [place] [address] [phone] Allergies Name Reaction Severity No Known Drug Allergies Provider's Orders Venous reflux Studies -Bilateral - ICD10: L97.812 - bilateral lower extremity edema with nonhealing ulcer right lateral lower leg Hand Signature: Date(s): Electronic Signature(s) Signed: 09/30/2019 4:25:30 PM By: Baruch Gouty RN, BSN Signed: 10/02/2019 3:47:20 PM By: Worthy Keeler PA-C Entered By: Baruch Gouty on 09/30/2019 09:48:18 -------------------------------------------------------------------------------- Problem List Details Patient Name: Date of Service: [name], [name] 09/30/2019 8:30 A M Medical Record Number: [id number] Patient Account Number: [id number] Date of Birth/Sex: Treating RN: Jan 04, 1966 (54 y.o. [name] Primary Care Provider: Precious Haws Other Clinician: Referring Provider: Treating Provider/Extender: [name], [name] in Treatment: 8 Active Problems ICD-10 Encounter Code Description Active Date MDM Diagnosis S80.811D Abrasion, right lower leg, subsequent encounter 08/04/2019 No Yes L97.812 Non-pressure chronic ulcer of other part of right lower leg with fat layer 08/04/2019 No Yes exposed E11.622 Type 2 diabetes mellitus with other skin ulcer 08/04/2019 No Yes I89.0 Lymphedema, not elsewhere classified 08/04/2019 No Yes I87.2 Venous insufficiency (chronic)  (peripheral) 09/30/2019 No Yes Inactive Problems Resolved Problems Electronic Signature(s) Signed: 09/30/2019 9:57:39 AM By: Worthy Keeler PA-C Previous Signature: 09/30/2019 8:55:39 AM Version By: Worthy Keeler PA-C Entered By: Worthy Keeler on 09/30/2019 09:57:38 -------------------------------------------------------------------------------- Progress Note Details Patient Name: Date of Service: [name], [name] 09/30/2019 8:30 A M Medical Record Number: [id number] Patient Account Number: [id number] Date of Birth/Sex: Treating RN: 1965-11-29 (54 y.o. [name] Primary Care Provider: Precious Haws Other Clinician: Referring Provider: Treating Provider/Extender: [name], [name] in Treatment: 8 Subjective Chief Complaint Information obtained from Patient 08/04/2019; patient is here for review of the wound on the right lateral lower leg History of Present Illness (HPI) ADMISSION 08/04/2019 This is a 54 year old man who traumatized his right lateral lower leg while going up an escalator at the mall sometime in December 2020. He was referred to vascular surgery and on 04/14/2019 he saw Dr. Wenda Overland. He was felt to have "palpable pulses and normal noninvasive studies" although I cannot see these results. He was back in his primary doctor's office on 07/30/2019 for a nonhealing right lower extremity wound. He was referred to wound care I think in [facility] but his insurance was out of network. A CNS was ordered. X-ray was done that was negative. He has been using wound cleanser and a Band-Aid. Past medical history is actually quite extensive and includes chronic kidney disease stage IV, chronic diastolic heart failure, atrial fibrillation, poorly controlled hypertension, poorly controlled diabetes with peripheral neuropathy and a recent hemoglobin A1c of 11.5, depression, lymphedema and sleep apnea ABI in our clinic was 1.1 in the right 5/6; this wound was initially  traumatized while the patient was going down on an escalator. Arrived in clinic last week with a completely nonviable surface. He required mechanical debridement and we have been using Iodoflex under compression. Apparently the compression wraps fell down. He also has poorly controlled diabetes 5/15; traumatic/contusion wound to the right lateral lower leg. We have been using Iodoflex to clean at the surface of this which was completely nonviable along with mechanical debridements. He is under compression.  09/02/2019 upon evaluation today patient appears to be doing well with regard to his wound on his right lateral leg. He has been tolerating the dressing changes without complication. Fortunately there is no signs of active infection at this time. No fevers, chills, nausea, vomiting, or diarrhea. With that being said there is some necrotic tissue around the edges of the wound that is and require some sharp debridement today. That was discussed with the patient and his mother in the office today prior to debridement to which she did consent. 09/09/2019 upon evaluation today patient appears to be making progress in regard to his wound. Fortunately the wound is not nearly as deep as what it was. It is measuring a little bit larger due to having to clear some of the edges away but again that is necessary to get this to heal. Fortunately there is no evidence of active infection at this time. No fevers, chills, nausea, vomiting, or diarrhea. 09/16/2019 on evaluation today patient appears to be doing excellent in regard to his wound currently. In fact I do not see anything that we can need to debride today which is great news. The wound bed looks healthy and the edges of the wound look like they are doing great. Overall I feel like we are at a good place and headed in the proper direction. 09/23/2019 upon evaluation today patient appears to be doing okay in regard to his wound. This is not measuring any smaller  it is about the same. With that being said he tells me that his wrap actually slid down he ended up having to take this off he was on a trip and did not have his Velcro compression with him. With that being said I do not see any signs of anything significantly worsening which is good news. 09/30/2019 upon evaluation today patient appears to be doing a little bit better in regard to his leg ulcer. He has been tolerating the dressing changes without complication. Fortunately there is no signs of active infection at this time. No fevers, chills, nausea, vomiting, or diarrhea. Objective Constitutional Well-nourished and well-hydrated in no acute distress. Vitals Time Taken: 9:18 AM, Height: 66 in, Weight: 256 lbs, BMI: 41.3, Temperature: 97.6 F, Pulse: 77 bpm, Respiratory Rate: 20 breaths/min, Blood Pressure: 153/89 mmHg. Respiratory normal breathing without difficulty. Psychiatric this patient is able to make decisions and demonstrates good insight into disease process. Alert and Oriented x 3. pleasant and cooperative. General Notes: Upon inspection patient's wound bed actually showed signs of excellent granulation at this time. There does not appear to be any evidence of active infection overall very pleased with where things stand today. Integumentary (Hair, Skin) Wound #1 status is Open. Original cause of wound was Trauma. The wound is located on the Right,Lateral Lower Leg. The wound measures 1.7cm length x 2.3cm width x 0.2cm depth; 3.071cm^2 area and 0.614cm^3 volume. There is Fat Layer (Subcutaneous Tissue) Exposed exposed. There is no tunneling or undermining noted. There is a medium amount of serosanguineous drainage noted. The wound margin is distinct with the outline attached to the wound base. There is large (67-100%) pink granulation within the wound bed. There is a small (1-33%) amount of necrotic tissue within the wound bed including Adherent Slough. 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 There was a Haematologist Compression Therapy Procedure by [name], RN. Post procedure Diagnosis Wound #1: Same as Pre-Procedure Plan Follow-up Appointments: Return Appointment in 1 week. Dressing Change Frequency: Do not change entire dressing for one week. Skin Barriers/Peri-Wound Care: Barrier cream - to periwound Moisturizing lotion Wound Cleansing: May shower with protection. Primary Wound Dressing: Wound #1 Right,Lateral Lower Leg: Silver Collagen - moisten with hydrogel Secondary Dressing: Wound #1 Right,Lateral Lower Leg: Dry Gauze ABD pad Edema Control: Wound #1 Right,Lateral Lower Leg: Unna Boot to Right Lower Extremity Avoid standing for long periods of time Elevate legs to the level of the heart or above for 30 minutes daily and/or when sitting, a frequency of: - throughout the day. Exercise regularly Services and Therapies ordered were: Venous reflux Studies -Bilateral - bilateral lower extremity edema with nonhealing ulcer right lateral lower leg 1. I would recommend currently that we go ahead and continue with the silver collagen dressing which I think has been beneficial for the patient. 2. I am also can recommend that we go ahead and switch his compression wrap to the Unna boot wrap this may stay up better for him unfortunately the 3 layer keep sliding down despite the Unna to anchor at the top. 3. I am also can recommend at this time that the patient continue to monitor for any signs of increasing pain which could be an evidence of worsening  infection though I do not see anything right now. 4. We will also send him for a venous reflux study bilaterally to see if there is any evidence of venous reflux that could potentially be intervened in regard to in order to help the wound heal and prevent things like this in the future from being a problem for him. We will see patient back for reevaluation in 1 week here in the clinic. If anything worsens or changes patient will contact our office for additional recommendations. Electronic Signature(s) Signed: 09/30/2019 9:57:55 AM By: Worthy Keeler PA-C Previous Signature: 09/30/2019 9:52:57 AM Version By: Worthy Keeler PA-C Entered By: Worthy Keeler on 09/30/2019 09:57:55 -------------------------------------------------------------------------------- SuperBill Details Patient Name: Date of Service: [name], [name] 09/30/2019 Medical Record Number: [id number] Patient Account Number: [id number] Date of Birth/Sex: Treating RN: 1965-07-18 (54 y.o. [name] Primary Care Provider: Precious Haws Other Clinician: Referring Provider: Treating Provider/Extender: [name], [name] in Treatment: 8 Diagnosis Coding ICD-10 Codes Code Description [phone] Abrasion, right lower leg, subsequent encounter L97.812 Non-pressure chronic ulcer of other part of right lower leg with fat layer exposed E11.622 Type 2 diabetes mellitus with other skin ulcer I89.0 Lymphedema, not elsewhere classified I87.2 Venous insufficiency (chronic) (peripheral) Facility Procedures CPT4 Code: [id number] Description: (Facility Use Only) [phone] - APPLY Louretta Parma BOOT RT Modifier: Quantity: 1 Physician Procedures : CPT4 Code Description Modifier [id number] - WC PHYS LEVEL 3 - EST PT ICD-10 Diagnosis Description I87.2 Venous insufficiency (chronic) (peripheral) L97.812 Non-pressure chronic ulcer of other part of right lower leg with fat layer exposed E11.622  Type 2 diabetes mellitus with other skin  ulcer I89.0 Lymphedema, not elsewhere classified Quantity: 1 Electronic Signature(s) Signed: 09/30/2019 9:58:11 AM By: Worthy Keeler PA-C Previous Signature: 09/30/2019 9:53:10 AM Version By: Worthy Keeler PA-C Entered By: Worthy Keeler on 09/30/2019 09:58:11

## 2019-10-01 NOTE — Progress Notes (Addendum)
Daughenbaugh, PROCTOR ([id number]) Visit Report for 09/30/2019 Arrival Information Details Patient Name: Date of Service: [name], [name] 09/30/2019 8:30 A M Medical Record Number: [id number] Patient Account Number: [id number] Date of Birth/Sex: Treating RN: 1965/07/14 (54 y.o. Ulyses Amor, Vaughan Basta Primary Care Jong Rickman: Precious Haws Other Clinician: Referring Geneva Pallas: Treating Anjani Feuerborn/Extender: [name], [name] in Treatment: 8 Visit Information History Since Last Visit Added or deleted any medications: No Patient Arrived: Ambulatory Any new allergies or adverse reactions: No Arrival Time: 09:18 Had a fall or experienced change in No Accompanied By: self activities of daily living that may affect Transfer Assistance: None risk of falls: Patient Identification Verified: Yes Signs or symptoms of abuse/neglect since last visito No Secondary Verification Process Completed: Yes Hospitalized since last visit: No Patient Requires Transmission-Based Precautions: No Implantable device outside of the clinic excluding No Patient Has Alerts: No cellular tissue based products placed in the center since last visit: Has Dressing in Place as Prescribed: Yes Pain Present Now: Yes Electronic Signature(s) Signed: 10/01/2019 8:32:44 AM By: Sandre Kitty Entered By: Sandre Kitty on 09/30/2019 09:18:41 -------------------------------------------------------------------------------- Compression Therapy Details Patient Name: Date of Service: [name], [name] 09/30/2019 8:30 A M Medical Record Number: [id number] Patient Account Number: [id number] Date of Birth/Sex: Treating RN: 10/12/1965 (54 y.o. [name] Primary Care Loyed Wilmes: Precious Haws Other Clinician: Referring Arden Axon: Treating Jamarrius Salay/Extender: [name], [name] in Treatment: 8 Compression Therapy Performed for Wound Assessment: Wound #1 Right,Lateral Lower Leg Performed By: Clinician [name],  RN Compression Type: Rolena Infante Post Procedure Diagnosis Same as Pre-procedure Electronic Signature(s) Signed: 09/30/2019 4:25:30 PM By: Baruch Gouty RN, BSN Entered By: Baruch Gouty on 09/30/2019 09:45:53 -------------------------------------------------------------------------------- Encounter Discharge Information Details Patient Name: Date of Service: [name], [name] 09/30/2019 8:30 A M Medical Record Number: [id number] Patient Account Number: [id number] Date of Birth/Sex: Treating RN: 1965-04-27 (53 y.o. [name] Primary Care Kai Calico: Precious Haws Other Clinician: Referring Amena Dockham: Treating Dvora Buitron/Extender: Katheran Awe Weeks in Treatment: 8 Encounter Discharge Information Items Discharge Condition: Stable Ambulatory Status: Ambulatory Discharge Destination: Home Transportation: Private Auto Accompanied By: self Schedule Follow-up Appointment: Yes Clinical Summary of Care: Patient Declined Electronic Signature(s) Signed: 09/30/2019 4:51:47 PM By: [name] RN Entered By: [name] on 09/30/2019 10:10:24 -------------------------------------------------------------------------------- Lower Extremity Assessment Details Patient Name: Date of Service: [name], [name] 09/30/2019 8:30 A M Medical Record Number: [id number] Patient Account Number: [id number] Date of Birth/Sex: Treating RN: Jan 25, 1966 (54 y.o. [name] Primary Care Calyx Hawker: Precious Haws Other Clinician: Referring Yordy Matton: Treating Janah Mcculloh/Extender: [name], [name] in Treatment: 8 Edema Assessment Assessed: [Left: No] [Right: Yes] Edema: [Left: Ye] [Right: s] Calf Left: Right: Point of Measurement: 31 cm From Medial Instep cm 42 cm Ankle Left: Right: Point of Measurement: 8 cm From Medial Instep cm 23 cm Vascular Assessment Pulses: Dorsalis Pedis Palpable: [Right:Yes] Electronic Signature(s) Signed: 09/30/2019 4:53:33 PM By: Deon Pilling Entered By: Deon Pilling on 09/30/2019 09:26:48 -------------------------------------------------------------------------------- [place] Details Patient Name: Date of Service: [name], [name] 09/30/2019 8:30 A M Medical Record Number: [id number] Patient Account Number: [id number] Date of Birth/Sex: Treating RN: [birth year] (54 y.o. [name] Primary Care Bona Hubbard: Precious Haws Other Clinician: Referring Jeramey Lanuza: Treating Melissa Pulido/Extender: [name], [name] in Treatment: 8 Active Inactive Venous Leg Ulcer Nursing Diagnoses: Knowledge deficit related to disease process and management Potential for venous Insuffiency (use before diagnosis confirmed) Goals: Patient will maintain optimal  edema control Date Initiated: 09/09/2019 Target Resolution Date: 10/07/2019 Goal Status: Active Patient/caregiver will verbalize understanding of disease process and disease management Date Initiated: 09/09/2019 Target Resolution Date: 10/07/2019 Goal Status: Active Interventions: Assess peripheral edema status every visit. Compression as ordered Provide education on venous insufficiency Treatment Activities: Therapeutic compression applied : 09/09/2019 Notes: Wound/Skin Impairment Nursing Diagnoses: Knowledge deficit related to ulceration/compromised skin integrity Goals: Patient/caregiver will verbalize understanding of skin care regimen Date Initiated: 08/04/2019 Target Resolution Date: 10/07/2019 Goal Status: Active Ulcer/skin breakdown will have a volume reduction of 30% by week 4 Date Initiated: 08/04/2019 Date Inactivated: 09/09/2019 Target Resolution Date: 09/11/2019 Goal Status: Met Ulcer/skin breakdown will have a volume reduction of 50% by week 8 Date Initiated: 09/09/2019 Target Resolution Date: 10/07/2019 Goal Status: Active Interventions: Assess patient/caregiver ability to obtain necessary supplies Assess patient/caregiver ability to  perform ulcer/skin care regimen upon admission and as needed Assess ulceration(s) every visit Notes: Electronic Signature(s) Signed: 09/30/2019 4:25:30 PM By: Baruch Gouty RN, BSN Entered By: Baruch Gouty on 09/30/2019 09:43:01 -------------------------------------------------------------------------------- Pain Assessment Details Patient Name: Date of Service: [name], [name] 09/30/2019 8:30 A M Medical Record Number: [id number] Patient Account Number: [id number] Date of Birth/Sex: Treating RN: May 28, 1965 (54 y.o. [name] Primary Care Jeronica Stlouis: Precious Haws Other Clinician: Referring Emmalene Kattner: Treating Ronav Furney/Extender: [name], [name] in Treatment: 8 Active Problems Location of Pain Severity and Description of Pain Patient Has Paino Yes Site Locations Rate the pain. Current Pain Level: 7 Pain Management and Medication Current Pain Management: Electronic Signature(s) Signed: 09/30/2019 4:25:30 PM By: Baruch Gouty RN, BSN Signed: 10/01/2019 8:32:44 AM By: Sandre Kitty Entered By: Sandre Kitty on 09/30/2019 09:19:18 -------------------------------------------------------------------------------- Patient/Caregiver Education Details Patient Name: Date of Service: [name], [name] 6/23/2021andnbsp8:30 A M Medical Record Number: [id number] Patient Account Number: [id number] Date of Birth/Gender: Treating RN: November 08, 1965 (54 y.o. [name] Primary Care Physician: Precious Haws Other Clinician: Referring Physician: Treating Physician/Extender: Burnard Hawthorne in Treatment: 8 Education Assessment Education Provided To: Patient Education Topics Provided Venous: Methods: Explain/Verbal Responses: Reinforcements needed, State content correctly Electronic Signature(s) Signed: 09/30/2019 4:25:30 PM By: Baruch Gouty RN, BSN Entered By: Baruch Gouty on 09/30/2019  09:43:30 -------------------------------------------------------------------------------- Wound Assessment Details Patient Name: Date of Service: [name], [name] 09/30/2019 8:30 A M Medical Record Number: [id number] Patient Account Number: [id number] Date of Birth/Sex: Treating RN: 03/20/1966 (54 y.o. [name] Primary Care Galan Ghee: Precious Haws Other Clinician: Referring Hallee Mckenny: Treating Chailyn Racette/Extender: [name], [name] in Treatment: 8 Wound Status Wound Number: 1 Primary Diabetic Wound/Ulcer of the Lower Extremity Etiology: Wound Location: Right, Lateral Lower Leg Wound Open Wounding Event: Trauma Status: Date Acquired: 04/10/2019 Comorbid Lymphedema, Sleep Apnea, Arrhythmia, Congestive Heart Failure, Weeks Of Treatment: 8 History: Hypertension, Peripheral Arterial Disease, Type II Diabetes Clustered Wound: No Photos Wound Measurements Length: (cm) 1.7 Width: (cm) 2.3 Depth: (cm) 0.2 Area: (cm) 3.071 Volume: (cm) 0.614 % Reduction in Area: -255.4% % Reduction in Volume: -254.9% Epithelialization: Small (1-33%) Tunneling: No Undermining: No Wound Description Classification: Grade 2 Wound Margin: Distinct, outline attached Exudate Amount: Medium Exudate Type: Serosanguineous Exudate Color: red, brown Foul Odor After Cleansing: No Slough/Fibrino Yes Wound Bed Granulation Amount: Large (67-100%) Exposed Structure Granulation Quality: Pink Fascia Exposed: No Necrotic Amount: Small (1-33%) Fat Layer (Subcutaneous Tissue) Exposed: Yes Necrotic Quality: Adherent Slough Tendon Exposed: No Muscle Exposed: No Joint Exposed: No Bone Exposed: No Treatment Notes Wound #1 (Right, Lateral Lower Leg) 1. Cleanse With  Wound Cleanser Soap and water 3. Primary Dressing Applied [employer] 4. Secondary Dressing Dry Gauze 6. Support Layer Applied Kerl[employer] Notes netting. Electronic Signature(s) Signed: 10/01/2019 5:20:52 PM  By: Baruch Gouty RN, BSN Signed: 10/02/2019 5:12:49 PM By: Minerva Fester Previous Signature: 09/30/2019 4:25:30 PM Version By: Baruch Gouty RN, BSN Previous Signature: 09/30/2019 4:53:33 PM Version By: Deon Pilling Entered By: Minerva Fester on 10/01/2019 13:59:38 -------------------------------------------------------------------------------- Vitals Details Patient Name: Date of Service: [name], [name] 09/30/2019 8:30 A M Medical Record Number: [id number] Patient Account Number: [id number] Date of Birth/Sex: Treating RN: 1965-12-05 (54 y.o. [name] Primary Care Elige Shouse: Precious Haws Other Clinician: Referring Jacobo Moncrief: Treating Amar Keenum/Extender: [name], [name] in Treatment: 8 Vital Signs Time Taken: 09:18 Temperature (F): 97.6 Height (in): 66 Pulse (bpm): 77 Weight (lbs): 256 Respiratory Rate (breaths/min): 20 Body Mass Index (BMI): 41.3 Blood Pressure (mmHg): 153/89 Reference Range: 80 - 120 mg / dl Electronic Signature(s) Signed: 10/01/2019 8:32:44 AM By: Sandre Kitty Entered By: Sandre Kitty on 09/30/2019 09:19:09

## 2019-10-06 DIAGNOSIS — R600 Localized edema: Secondary | ICD-10-CM | POA: Insufficient documentation

## 2019-10-06 DIAGNOSIS — H6981 Other specified disorders of Eustachian tube, right ear: Secondary | ICD-10-CM | POA: Insufficient documentation

## 2019-10-07 ENCOUNTER — Encounter (HOSPITAL_BASED_OUTPATIENT_CLINIC_OR_DEPARTMENT_OTHER): Payer: Medicare Other | Admitting: Physician Assistant

## 2019-10-14 ENCOUNTER — Encounter (HOSPITAL_BASED_OUTPATIENT_CLINIC_OR_DEPARTMENT_OTHER): Payer: Medicare Other | Attending: Physician Assistant | Admitting: Physician Assistant

## 2019-10-14 DIAGNOSIS — I890 Lymphedema, not elsewhere classified: Secondary | ICD-10-CM | POA: Insufficient documentation

## 2019-10-14 DIAGNOSIS — L97812 Non-pressure chronic ulcer of other part of right lower leg with fat layer exposed: Secondary | ICD-10-CM | POA: Diagnosis not present

## 2019-10-14 DIAGNOSIS — I5032 Chronic diastolic (congestive) heart failure: Secondary | ICD-10-CM | POA: Insufficient documentation

## 2019-10-14 DIAGNOSIS — N184 Chronic kidney disease, stage 4 (severe): Secondary | ICD-10-CM | POA: Insufficient documentation

## 2019-10-14 DIAGNOSIS — G4730 Sleep apnea, unspecified: Secondary | ICD-10-CM | POA: Insufficient documentation

## 2019-10-14 DIAGNOSIS — I130 Hypertensive heart and chronic kidney disease with heart failure and stage 1 through stage 4 chronic kidney disease, or unspecified chronic kidney disease: Secondary | ICD-10-CM | POA: Insufficient documentation

## 2019-10-14 DIAGNOSIS — E1122 Type 2 diabetes mellitus with diabetic chronic kidney disease: Secondary | ICD-10-CM | POA: Diagnosis not present

## 2019-10-14 DIAGNOSIS — E11622 Type 2 diabetes mellitus with other skin ulcer: Secondary | ICD-10-CM | POA: Diagnosis not present

## 2019-10-14 DIAGNOSIS — I872 Venous insufficiency (chronic) (peripheral): Secondary | ICD-10-CM | POA: Diagnosis not present

## 2019-10-14 NOTE — Progress Notes (Signed)
[name] [name] ([id number]) Visit Report for 10/14/2019 Arrival Information Details Patient Name: Date of Service: [name] [name] 10/14/2019 8:30 A M Medical Record Number: [id number] Patient Account Number: [id number] Date of Birth/Sex: Treating RN: April 12, 1965 (54 y.o. [name] [name] Primary Care Philipe Laswell: Precious Haws Other Clinician: Referring Jazell Rosenau: Treating Vern Prestia/Extender: [name], [name] in Treatment: 10 Visit Information History Since Last Visit Added or deleted any medications: No Patient Arrived: Ambulatory Any new allergies or adverse reactions: No Arrival Time: 08:36 Had a fall or experienced change in No Accompanied By: self activities of daily living that may affect Transfer Assistance: None risk of falls: Patient Identification Verified: Yes Signs or symptoms of abuse/neglect since last visito No Secondary Verification Process Completed: Yes Hospitalized since last visit: No Patient Requires Transmission-Based Precautions: No Implantable device outside of the clinic excluding No Patient Has Alerts: No cellular tissue based products placed in the center since last visit: Has Dressing in Place as Prescribed: No Has Compression in Place as Prescribed: No Pain Present Now: No Electronic Signature(s) Signed: 10/14/2019 5:12:52 PM By: Kela Millin Entered By: Kela Millin on 10/14/2019 08:37:20 -------------------------------------------------------------------------------- Compression Therapy Details Patient Name: Date of Service: [name] [name] 10/14/2019 8:30 A M Medical Record Number: [id number] Patient Account Number: [id number] Date of Birth/Sex: Treating RN: [birth year] (54 y.o. [name] [name] Primary Care Romar Woodrick: Precious Haws Other Clinician: Referring Berle Fitz: Treating Damontae Loppnow/Extender: [name], [name] in Treatment: 10 Compression Therapy Performed for Wound Assessment: Wound #1 Right,Lateral Lower  Leg Performed By: Clinician Kela Millin, RN Compression Type: Rolena Infante Post Procedure Diagnosis Same as Pre-procedure Electronic Signature(s) Signed: 10/14/2019 5:28:41 PM By: Baruch Gouty RN, BSN Entered By: Baruch Gouty on 10/14/2019 09:30:58 -------------------------------------------------------------------------------- Encounter Discharge Information Details Patient Name: Date of Service: [name] [name] 10/14/2019 8:30 A M Medical Record Number: [id number] Patient Account Number: [id number] Date of Birth/Sex: Treating RN: 11/04/1965 (54 y.o. [name] [name] Primary Care Termaine Roupp: Precious Haws Other Clinician: Referring Coleson Kant: Treating Niya Behler/Extender: [name], [name] in Treatment: 10 Encounter Discharge Information Items Discharge Condition: Stable Ambulatory Status: Ambulatory Discharge Destination: Home Transportation: Private Auto Accompanied By: alone Schedule Follow-up Appointment: Yes Clinical Summary of Care: Patient Declined Electronic Signature(s) Signed: 10/14/2019 5:25:38 PM By: Levan Hurst RN, BSN Entered By: Levan Hurst on 10/14/2019 09:42:13 -------------------------------------------------------------------------------- Lower Extremity Assessment Details Patient Name: Date of Service: [name] [name] 10/14/2019 8:30 A M Medical Record Number: [id number] Patient Account Number: [id number] Date of Birth/Sex: Treating RN: 11/06/1965 (54 y.o. [name] [name] Primary Care Kaliyah Gladman: Precious Haws Other Clinician: Referring Neely Kammerer: Treating Jemiah Cuadra/Extender: [name], [name] in Treatment: 10 Edema Assessment Assessed: [Left: No] [Right: No] Edema: [Left: Ye] [Right: s] Calf Left: Right: Point of Measurement: 31 cm From Medial Instep cm 46.5 cm Ankle Left: Right: Point of Measurement: 8 cm From Medial Instep cm 25 cm Vascular Assessment Pulses: Dorsalis Pedis Palpable:  [Right:Yes] Electronic Signature(s) Signed: 10/14/2019 5:12:52 PM By: Kela Millin Entered By: Kela Millin on 10/14/2019 08:36:33 -------------------------------------------------------------------------------- [place] Details Patient Name: Date of Service: [name] [name] 10/14/2019 8:30 A M Medical Record Number: [id number] Patient Account Number: [id number] Date of Birth/Sex: Treating RN: 1965/09/25 (54 y.o. [name] [name] Primary Care Navada Osterhout: Precious Haws Other Clinician: Referring Deloise Marchant: Treating Amethyst Gainer/Extender: [name], [name] in Treatment: 10 Active Inactive Venous Leg Ulcer Nursing Diagnoses: Knowledge deficit related to disease process and management Potential for venous Insuffiency (use  before diagnosis confirmed) Goals: Patient will maintain optimal edema control Date Initiated: 09/09/2019 Target Resolution Date: 11/04/2019 Goal Status: Active Patient/caregiver will verbalize understanding of disease process and disease management Date Initiated: 09/09/2019 Date Inactivated: 10/14/2019 Target Resolution Date: 10/07/2019 Goal Status: Met Interventions: Assess peripheral edema status every visit. Compression as ordered Provide education on venous insufficiency Treatment Activities: Therapeutic compression applied : 09/09/2019 Notes: Wound/Skin Impairment Nursing Diagnoses: Knowledge deficit related to ulceration/compromised skin integrity Goals: Patient/caregiver will verbalize understanding of skin care regimen Date Initiated: 08/04/2019 Target Resolution Date: 11/04/2019 Goal Status: Active Ulcer/skin breakdown will have a volume reduction of 30% by week 4 Date Initiated: 08/04/2019 Date Inactivated: 09/09/2019 Target Resolution Date: 09/11/2019 Goal Status: Met Ulcer/skin breakdown will have a volume reduction of 50% by week 8 Date Initiated: 09/09/2019 Date Inactivated: 10/14/2019 Target Resolution Date:  10/07/2019 Goal Status: Unmet Unmet Reason: awaiting reflux studies Ulcer/skin breakdown will have a volume reduction of 80% by week 12 Date Initiated: 10/14/2019 Target Resolution Date: 11/04/2019 Goal Status: Active Interventions: Assess patient/caregiver ability to obtain necessary supplies Assess patient/caregiver ability to perform ulcer/skin care regimen upon admission and as needed Assess ulceration(s) every visit Notes: Electronic Signature(s) Signed: 10/14/2019 5:28:41 PM By: Baruch Gouty RN, BSN Entered By: Baruch Gouty on 10/14/2019 08:53:25 -------------------------------------------------------------------------------- Pain Assessment Details Patient Name: Date of Service: [name] [name] 10/14/2019 8:30 A M Medical Record Number: [id number] Patient Account Number: [id number] Date of Birth/Sex: Treating RN: 06/22/1965 (54 y.o. [name] [name] Primary Care Carmellia Kreisler: Precious Haws Other Clinician: Referring Shatina Streets: Treating Marian Meneely/Extender: [name], [name] in Treatment: 10 Active Problems Location of Pain Severity and Description of Pain Patient Has Paino No Site Locations Pain Management and Medication Current Pain Management: Electronic Signature(s) Signed: 10/14/2019 5:12:52 PM By: Kela Millin Entered By: Kela Millin on 10/14/2019 08:37:44 -------------------------------------------------------------------------------- Patient/Caregiver Education Details Patient Name: Date of Service: [name] [name] 7/7/2021andnbsp8:30 [place] Number: [id number] Patient Account Number: [id number] Date of Birth/Gender: Treating RN: 18-Jan-1966 (54 y.o. [name] [name] Primary Care Physician: Precious Haws Other Clinician: Referring Physician: Treating Physician/Extender: Burnard Hawthorne in Treatment: 10 Education Assessment Education Provided To: Patient Education Topics Provided Venous: Methods:  Explain/Verbal Responses: Reinforcements needed, State content correctly Wound/Skin Impairment: Methods: Explain/Verbal Responses: Reinforcements needed, State content correctly Electronic Signature(s) Signed: 10/14/2019 5:28:41 PM By: Baruch Gouty RN, BSN Entered By: Baruch Gouty on 10/14/2019 08:53:47 -------------------------------------------------------------------------------- Wound Assessment Details Patient Name: Date of Service: [name] [name] 10/14/2019 8:30 A M Medical Record Number: [id number] Patient Account Number: [id number] Date of Birth/Sex: Treating RN: 1965-07-28 (54 y.o. [name] [name] Primary Care Koal Eslinger: Precious Haws Other Clinician: Referring Zachary Lovins: Treating Areli Jowett/Extender: [name], [name] in Treatment: 10 Wound Status Wound Number: 1 Primary Diabetic Wound/Ulcer of the Lower Extremity Etiology: Wound Location: Right, Lateral Lower Leg Wound Open Wounding Event: Trauma Status: Date Acquired: 04/10/2019 Comorbid Lymphedema, Sleep Apnea, Arrhythmia, Congestive Heart Failure, Weeks Of Treatment: 10 History: Hypertension, Peripheral Arterial Disease, Type II Diabetes Clustered Wound: No Photos Photo Uploaded By: Mikeal Hawthorne on 10/14/2019 13:58:51 Wound Measurements Length: (cm) 2.1 Width: (cm) 2.4 Depth: (cm) 0.2 Area: (cm) 3.958 Volume: (cm) 0.792 % Reduction in Area: -358.1% % Reduction in Volume: -357.8% Epithelialization: Small (1-33%) Tunneling: No Undermining: No Wound Description Classification: Grade 2 Wound Margin: Well defined, not attached Exudate Amount: Medium Exudate Type: Serosanguineous Exudate Color: red, brown Foul Odor After Cleansing: No Slough/Fibrino Yes Wound Bed Granulation Amount: Large (67-100%) Exposed  Structure Granulation Quality: Pink Fascia Exposed: No Necrotic Amount: Small (1-33%) Fat Layer (Subcutaneous Tissue) Exposed: Yes Necrotic Quality: Adherent Slough Tendon  Exposed: No Muscle Exposed: No Joint Exposed: No Bone Exposed: No Treatment Notes Wound #1 (Right, Lateral Lower Leg) 1. Cleanse With Soap and water 2. Periwound Care Barrier cream Moisturizing lotion 3. Primary Dressing Applied Hydrofera Blue 4. Secondary Dressing ABD Pad Dry Gauze 6. Support Layer Best boy) Signed: 10/14/2019 5:12:52 PM By: Kela Millin Signed: 10/14/2019 5:25:38 PM By: Levan Hurst RN, BSN Entered By: Levan Hurst on 10/14/2019 08:47:31 -------------------------------------------------------------------------------- [place] Details Patient Name: Date of Service: [name] [name] 10/14/2019 8:30 A M Medical Record Number: [id number] Patient Account Number: [id number] Date of Birth/Sex: Treating RN: [birth year] (54 y.o. [name] [name] Primary Care Viraat Vanpatten: Precious Haws Other Clinician: Referring Shadow Schedler: Treating Thomasena Vandenheuvel/Extender: [name], [name] in Treatment: 10 Vital Signs Time Taken: 08:35 Temperature (F): 97.5 Height (in): 66 Pulse (bpm): 82 Weight (lbs): 256 Respiratory Rate (breaths/min): 19 Body Mass Index (BMI): 41.3 Blood Pressure (mmHg): 169/94 Reference Range: 80 - 120 mg / dl Electronic Signature(s) Signed: 10/14/2019 5:12:52 PM By: Kela Millin Entered By: Kela Millin on 10/14/2019 08:37:40

## 2019-10-14 NOTE — Progress Notes (Addendum)
Smoots, ALECK ([id number]) Visit Report for 10/14/2019 Chief Complaint Document Details Patient Name: Date of Service: [name], [name] 10/14/2019 8:30 A M Medical Record Number: [id number] Patient Account Number: [id number] Date of Birth/Sex: Treating RN: [birth year] (54 y.o. [name] Primary Care Provider: Precious Haws Other Clinician: Referring Provider: Treating Provider/Extender: [name], [name] in Treatment: 10 Information Obtained from: Patient Chief Complaint 08/04/2019; patient is here for review of the wound on the right lateral lower leg Electronic Signature(s) Signed: 10/14/2019 9:22:56 AM By: Worthy Keeler PA-C Entered By: Worthy Keeler on 10/14/2019 09:22:55 -------------------------------------------------------------------------------- HPI Details Patient Name: Date of Service: [name], [name] 10/14/2019 8:30 A M Medical Record Number: [id number] Patient Account Number: [id number] Date of Birth/Sex: Treating RN: 1965-12-18 (54 y.o. [name] Primary Care Provider: Precious Haws Other Clinician: Referring Provider: Treating Provider/Extender: [name], [name] in Treatment: 10 History of Present Illness HPI Description: ADMISSION 08/04/2019 This is a 54 year old man who traumatized his right lateral lower leg while going [place] an escalator at the mall sometime in December 2020. He was referred to vascular surgery and on 04/14/2019 he saw Dr. Wenda Overland. He was felt to have "palpable pulses and normal noninvasive studies" although I cannot see these results. He was back in his primary doctor's office on 07/30/2019 for a nonhealing right lower extremity wound. He was referred to wound care I think in [facility] but his insurance was out of network. A CNS was ordered. X-ray was done that was negative. He has been using wound cleanser and a Band-Aid. Past medical history is actually quite extensive and includes chronic kidney disease  stage IV, chronic diastolic heart failure, atrial fibrillation, poorly controlled hypertension, poorly controlled diabetes with peripheral neuropathy and a recent hemoglobin A1c of 11.5, depression, lymphedema and sleep apnea ABI in our clinic was 1.1 in the right 5/6; this wound was initially traumatized while the patient was going down on an escalator. Arrived in clinic last week with a completely nonviable surface. He required mechanical debridement and we have been using Iodoflex under compression. Apparently the compression wraps fell down. He also has poorly controlled diabetes 5/15; traumatic/contusion wound to the right lateral lower leg. We have been using Iodoflex to clean at the surface of this which was completely nonviable along with mechanical debridements. He is under compression. 09/02/2019 upon evaluation today patient appears to be doing well with regard to his wound on his right lateral leg. He has been tolerating the dressing changes without complication. Fortunately there is no signs of active infection at this time. No fevers, chills, nausea, vomiting, or diarrhea. With that being said there is some necrotic tissue around the edges of the wound that is and require some sharp debridement today. That was discussed with the patient and his mother in the office today prior to debridement to which she did consent. 09/09/2019 upon evaluation today patient appears to be making progress in regard to his wound. Fortunately the wound is not nearly as deep as what it was. It is measuring a little bit larger due to having to clear some of the edges away but again that is necessary to get this to heal. Fortunately there is no evidence of active infection at this time. No fevers, chills, nausea, vomiting, or diarrhea. 09/16/2019 on evaluation today patient appears to be doing excellent in regard to his wound currently. In fact I do not see anything that we can need to debride  today which is great  news. The wound bed looks healthy and the edges of the wound look like they are doing great. Overall I feel like we are at a good place and headed in the proper direction. 09/23/2019 upon evaluation today patient appears to be doing okay in regard to his wound. This is not measuring any smaller it is about the same. With that being said he tells me that his wrap actually slid down he ended up having to take this off he was on a trip and did not have his Velcro compression with him. With that being said I do not see any signs of anything significantly worsening which is good news. 09/30/2019 upon evaluation today patient appears to be doing a little bit better in regard to his leg ulcer. He has been tolerating the dressing changes without complication. Fortunately there is no signs of active infection at this time. No fevers, chills, nausea, vomiting, or diarrhea. 10/14/2019 upon evaluation today patient's wound appears to be healthier. Overall but unfortunately is measuring a little bit bigger. With that being said I do think that we do need to still continue to wrap his leg. Fortunately there is no signs of active infection at this time which is great news. Electronic Signature(s) Signed: 10/14/2019 10:35:46 AM By: Worthy Keeler PA-C Entered By: Worthy Keeler on 10/14/2019 10:35:46 -------------------------------------------------------------------------------- Physical Exam Details Patient Name: Date of Service: [name], [name] 10/14/2019 8:30 A M Medical Record Number: [id number] Patient Account Number: [id number] Date of Birth/Sex: Treating RN: 10/07/1965 (54 y.o. [name] Primary Care Provider: Precious Haws Other Clinician: Referring Provider: Treating Provider/Extender: [name], [name] in Treatment: 10 Constitutional Well-nourished and well-hydrated in no acute distress. Respiratory normal breathing without difficulty. Psychiatric this patient is able to make  decisions and demonstrates good insight into disease process. Alert and Oriented x 3. pleasant and cooperative. Notes Patient's wound bed currently showed signs of good granulation though there was some evidence of hyper granular tissue unfortunately. Overall I do feel like he is actually doing much better and I hope that potentially making a dressing change may make a difference here for him as well. Would likely get a switch over to [facility] to see if that will make a difference. Electronic Signature(s) Signed: 10/14/2019 10:36:23 AM By: Worthy Keeler PA-C Entered By: Worthy Keeler on 10/14/2019 10:36:22 -------------------------------------------------------------------------------- Physician Orders Details Patient Name: Date of Service: [name], [name] 10/14/2019 8:30 A M Medical Record Number: [id number] Patient Account Number: [id number] Date of Birth/Sex: Treating RN: 10/06/1965 (54 y.o. 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 - with Margarita Grizzle Return A Nurse Visit: - 1 week Dressing Change Frequency Do not change entire dressing for one week. Skin Barriers/Peri-Wound Care Barrier cream - to periwound Moisturizing lotion Wound Cleansing May shower with protection. Primary Wound Dressing Wound #1 Right,Lateral Lower Leg Hydrofera Blue - classic Secondary Dressing Wound #1 Right,Lateral Lower Leg Dry Gauze ABD pad Edema  Control Wound #1 Right,Lateral Lower Leg Unna Boot to Right Lower  Extremity Avoid standing for long periods of time Elevate legs to the level of the heart or above for 30 minutes daily and/or when sitting, a frequency of: - throughout the day. Exercise regularly Electronic Signature(s) Signed: 10/14/2019 5:11:14 PM By: Worthy Keeler PA-C Signed: 10/14/2019 5:28:41 PM By: Baruch Gouty RN, BSN Entered By: Baruch Gouty on 10/14/2019 09:29:32 -------------------------------------------------------------------------------- Problem List Details Patient Name: Date of Service: [name], [name] 10/14/2019 8:30 A M Medical Record Number: [id number] Patient Account Number: [id number] Date of Birth/Sex: Treating RN: 01/28/1966 (54 y.o. [name] Primary Care Provider: Precious Haws Other Clinician: Referring Provider: Treating Provider/Extender: [name], [name] in Treatment: 10 Active Problems ICD-10 Encounter Code Description Active Date MDM Diagnosis S80.811D Abrasion, right lower leg, subsequent encounter 08/04/2019 No Yes L97.812 Non-pressure chronic ulcer of other part of right lower leg with fat layer 08/04/2019 No Yes exposed E11.622 Type 2 diabetes mellitus with other skin ulcer 08/04/2019 No Yes I89.0 Lymphedema, not elsewhere classified 08/04/2019 No Yes I87.2 Venous insufficiency (chronic) (peripheral) 09/30/2019 No Yes Inactive Problems Resolved Problems Electronic Signature(s) Signed: 10/14/2019 9:22:50 AM By: Worthy Keeler PA-C Entered By: Worthy Keeler on 10/14/2019 09:22:49 -------------------------------------------------------------------------------- Progress Note Details Patient Name: Date of Service: [name], [name] 10/14/2019 8:30 A M Medical Record Number: [id number] Patient Account Number: [id number] Date of Birth/Sex: Treating RN: 1966/02/17 (54 y.o. [name] Primary Care Provider: Precious Haws Other Clinician: Referring  Provider: Treating Provider/Extender: [name], [name] in Treatment: 10 Subjective Chief Complaint Information obtained from Patient 08/04/2019; patient is here for review of the wound on the right lateral lower leg History of Present Illness (HPI) ADMISSION 08/04/2019 This is a 54 year old man who traumatized his right lateral lower leg while going up an escalator at the mall sometime in December 2020. He was referred to vascular surgery and on 04/14/2019 he saw Dr. Wenda Overland. He was felt to have "palpable pulses and normal noninvasive studies" although I cannot see these results. He was back in his primary doctor's office on 07/30/2019 for a nonhealing right lower extremity wound. He was referred to wound care I think in [facility] but his insurance was out of network. A CNS was ordered. X-ray was done that was negative. He has been using wound cleanser and a Band-Aid. Past medical history is actually quite extensive and includes chronic kidney disease stage IV, chronic diastolic heart failure, atrial fibrillation, poorly controlled hypertension, poorly controlled diabetes with peripheral neuropathy and a recent hemoglobin A1c of 11.5, depression, lymphedema and sleep apnea ABI in our clinic was 1.1 in the right 5/6; this wound was initially traumatized while the patient was going down on an escalator. Arrived in clinic last week with a completely nonviable surface. He required mechanical debridement and we have been using Iodoflex under compression. Apparently the compression wraps fell down. He also has poorly controlled diabetes 5/15; traumatic/contusion wound to the right lateral lower leg. We have been using Iodoflex to clean at the surface of this which was completely nonviable along with mechanical debridements. He is under compression. 09/02/2019 upon evaluation today patient appears to be doing well with regard to his wound on his right lateral leg. He has been  tolerating the dressing changes without complication. Fortunately there is no signs of active infection at this time. No fevers, chills, nausea, vomiting, or diarrhea. With that being said there is some necrotic tissue around the edges of the wound that is and require  some sharp debridement today. That was discussed with the patient and his mother in the office today prior to debridement to which she did consent. 09/09/2019 upon evaluation today patient appears to be making progress in regard to his wound. Fortunately the wound is not nearly as deep as what it was. It is measuring a little bit larger due to having to clear some of the edges away but again that is necessary to get this to heal. Fortunately there is no evidence of active infection at this time. No fevers, chills, nausea, vomiting, or diarrhea. 09/16/2019 on evaluation today patient appears to be doing excellent in regard to his wound currently. In fact I do not see anything that we can need to debride today which is great news. The wound bed looks healthy and the edges of the wound look like they are doing great. Overall I feel like we are at a good place and headed in the proper direction. 09/23/2019 upon evaluation today patient appears to be doing okay in regard to his wound. This is not measuring any smaller it is about the same. With that being said he tells me that his wrap actually slid down he ended up having to take this off he was on a trip and did not have his Velcro compression with him. With that being said I do not see any signs of anything significantly worsening which is good news. 09/30/2019 upon evaluation today patient appears to be doing a little bit better in regard to his leg ulcer. He has been tolerating the dressing changes without complication. Fortunately there is no signs of active infection at this time. No fevers, chills, nausea, vomiting, or diarrhea. 10/14/2019 upon evaluation today patient's wound appears to be  healthier. Overall but unfortunately is measuring a little bit bigger. With that being said I do think that we do need to still continue to wrap his leg. Fortunately there is no signs of active infection at this time which is great news. Objective Constitutional Well-nourished and well-hydrated in no acute distress. Vitals Time Taken: 8:35 AM, Height: 66 in, Weight: 256 lbs, BMI: 41.3, Temperature: 97.5 F, Pulse: 82 bpm, Respiratory Rate: 19 breaths/min, Blood Pressure: 169/94 mmHg. Respiratory normal breathing without difficulty. Psychiatric this patient is able to make decisions and demonstrates good insight into disease process. Alert and Oriented x 3. pleasant and cooperative. General Notes: Patient's wound bed currently showed signs of good granulation though there was some evidence of hyper granular tissue unfortunately. Overall I do feel like he is actually doing much better and I hope that potentially making a dressing change may make a difference here for him as well. Would likely get a switch over to [facility] to see if that will make a difference. Integumentary (Hair, Skin) Wound #1 status is Open. Original cause of wound was Trauma. The wound is located on the Right,Lateral Lower Leg. The wound measures 2.1cm length x 2.4cm width x 0.2cm depth; 3.958cm^2 area and 0.792cm^3 volume. There is Fat Layer (Subcutaneous Tissue) Exposed exposed. There is no tunneling or undermining noted. There is a medium amount of serosanguineous drainage noted. The wound margin is well defined and not attached to the wound base. There is large (67-100%) pink granulation within the wound bed. There is a small (1-33%) amount of necrotic tissue within the wound bed including Adherent Slough. Assessment Active Problems ICD-10 Abrasion, right lower leg, subsequent encounter Non-pressure chronic ulcer of other part of right lower leg with fat layer exposed Type  2 diabetes mellitus with other skin  ulcer Lymphedema, not elsewhere classified Venous insufficiency (chronic) (peripheral) Procedures Wound #1 Pre-procedure diagnosis of Wound #1 is a Diabetic Wound/Ulcer of the Lower Extremity located on the Right,Lateral Lower Leg . There was a Haematologist Compression Therapy Procedure by Kela Millin, RN. Post procedure Diagnosis Wound #1: Same as Pre-Procedure Plan Follow-up Appointments: Return Appointment in 2 weeks. - with Margarita Grizzle Nurse Visit: - 1 week Dressing Change Frequency: Do not change entire dressing for one week. Skin Barriers/Peri-Wound Care: Barrier cream - to periwound Moisturizing lotion Wound Cleansing: May shower with protection. Primary Wound Dressing: Wound #1 Right,Lateral Lower Leg: Hydrofera Blue - classic Secondary Dressing: Wound #1 Right,Lateral Lower Leg: Dry Gauze ABD pad Edema Control: Wound #1 Right,Lateral Lower Leg: Unna Boot to Right Lower Extremity Avoid standing for long periods of time Elevate legs to the level of the heart or above for 30 minutes daily and/or when sitting, a frequency of: - throughout the day. Exercise regularly 1. I would suggest again that we switch to [facility] dressing I think that would be ideal at this point based on what I am seeing. 2. I am also can recommend that we go ahead and initiate treatment with a continuation of the Unna boot wrap he feels like that is the best for him. 3. I am also going to suggest the patient continue to elevate his legs much as possible to try to keep edema under good control. We will see patient back for reevaluation in 2 weeks here in the clinic. If anything worsens or changes patient will contact our office for additional recommendations. Electronic Signature(s) Signed: 10/14/2019 10:37:13 AM By: Worthy Keeler PA-C Entered By: Worthy Keeler on 10/14/2019 10:37:12 -------------------------------------------------------------------------------- SuperBill Details Patient Name:  Date of Service: [name], [name] 10/14/2019 Medical Record Number: [id number] Patient Account Number: [id number] Date of Birth/Sex: Treating RN: [birth year] (54 y.o. 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.622 Type 2 diabetes mellitus with other skin ulcer I89.0 Lymphedema, not elsewhere classified I87.2 Venous insufficiency (chronic) (peripheral) Facility Procedures CPT4 Code: [id number] Description: (Facility Use Only) [phone] - APPLY Louretta Parma BOOT RT Modifier: Quantity: 1 Physician Procedures : CPT4 Code Description Modifier [id number] - WC PHYS LEVEL 3 - EST PT ICD-10 Diagnosis Description S80.811D Abrasion, right lower leg, subsequent encounter L97.812 Non-pressure chronic ulcer of other part of right lower leg with fat layer exposed  E11.622 Type 2 diabetes mellitus with other skin ulcer I89.0 Lymphedema, not elsewhere classified Quantity: 1 Electronic Signature(s) Signed: 10/14/2019 10:38:20 AM By: Worthy Keeler PA-C Entered By: Worthy Keeler on 10/14/2019 10:38:20

## 2019-10-21 ENCOUNTER — Encounter (HOSPITAL_BASED_OUTPATIENT_CLINIC_OR_DEPARTMENT_OTHER): Payer: Medicare Other | Admitting: Physician Assistant

## 2019-10-21 DIAGNOSIS — E11622 Type 2 diabetes mellitus with other skin ulcer: Secondary | ICD-10-CM | POA: Diagnosis not present

## 2019-10-23 NOTE — Progress Notes (Signed)
Duren, PROPHET ([id number]) Visit Report for 10/21/2019 Arrival Information Details Patient Name: Date of Service: [name], [name] 10/21/2019 9:00 A M Medical Record Number: [id number] Patient Account Number: [id number] Date of Birth/Sex: Treating RN: 1965/12/02 (54 y.o. [name]) [name] Primary Care Conrad Zajkowski: Precious Haws Other Clinician: Referring Anastasha Ortez: Treating Tierre Gerard/Extender: Katheran Awe Weeks in Treatment: 11 Visit Information History Since Last Visit All ordered tests and consults were completed: No Patient Arrived: Ambulatory Added or deleted any medications: No Arrival Time: 09:02 Any new allergies or adverse reactions: No Accompanied By: self Had a fall or experienced change in No Transfer Assistance: None activities of daily living that may affect Patient Identification Verified: Yes risk of falls: Secondary Verification Process Completed: Yes Signs or symptoms of abuse/neglect since last visito No Patient Requires Transmission-Based Precautions: No Hospitalized since last visit: No Patient Has Alerts: No Implantable device outside of the clinic excluding No cellular tissue based products placed in the center since last visit: Has Dressing in Place as Prescribed: Yes Has Compression in Place as Prescribed: Yes Pain Present Now: Yes Electronic Signature(s) Signed: 10/23/2019 3:56:02 PM By: [name] RN Entered By: [name] on 10/21/2019 09:02:37 -------------------------------------------------------------------------------- Compression Therapy Details Patient Name: Date of Service: [name], [name] 10/21/2019 9:00 A M Medical Record Number: [id number] Patient Account Number: [id number] Date of Birth/Sex: Treating RN: 1965/09/19 (53 y.o. [name]) [name] Primary Care Rashon Rezek: Precious Haws Other Clinician: Referring Sarabi Sockwell: Treating Adelee Hannula/Extender: [name], [name] in Treatment: 11 Compression Therapy Performed for  Wound Assessment: Wound #1 Right,Lateral Lower Leg Performed By: Jake Church, RN Compression Type: Rolena Infante Electronic Signature(s) Signed: 10/23/2019 3:56:02 PM By: [name] RN Entered By: [name] on 10/21/2019 09:22:51 -------------------------------------------------------------------------------- Encounter Discharge Information Details Patient Name: Date of Service: [name], [name] 10/21/2019 9:00 A M Medical Record Number: [id number] Patient Account Number: [id number] Date of Birth/Sex: Treating RN: 07-19-1965 (53 y.o. [name] Primary Care Deondra Labrador: Precious Haws Other Clinician: Referring Mehek Grega: Treating Shyler Holzman/Extender: [name], [name] in Treatment: 11 Encounter Discharge Information Items Discharge Condition: Stable Ambulatory Status: Ambulatory Discharge Destination: Home Transportation: Private Auto Accompanied By: self Schedule Follow-up Appointment: Yes Clinical Summary of Care: Patient Declined Notes patient denies lightheadedness, dizziness, headache, blurred vision, ringing of ears, nose bleed, increased confusion . instructed patient to go to emergency room ASAP, related to elevated blood pressure Electronic Signature(s) Signed: 10/23/2019 3:56:02 PM By: [name] RN Entered By: [name] on 10/21/2019 09:26:59 -------------------------------------------------------------------------------- Patient/Caregiver Education Details Patient Name: Date of Service: [name], [name] 7/14/2021andnbsp9:00 [place] Number: [id number] Patient Account Number: [id number] Date of Birth/Gender: Treating RN: 03/04/1966 (53 y.o. [name]) [name] Primary Care Physician: Precious Haws Other Clinician: Referring Physician: Treating Physician/Extender: Burnard Hawthorne in Treatment: 11 Education Assessment Education Provided To: Patient Education Topics Provided Venous: Methods: Explain/Verbal Responses:  State content correctly Electronic Signature(s) Signed: 10/23/2019 3:56:02 PM By: [name] RN Entered By: [name] on 10/21/2019 09:25:09 -------------------------------------------------------------------------------- Wound Assessment Details Patient Name: Date of Service: [name], [name] 10/21/2019 9:00 A M Medical Record Number: [id number] Patient Account Number: [id number] Date of Birth/Sex: Treating RN: 29-Nov-1965 (53 y.o. [name] Primary Care Azaylia Fong: Precious Haws Other Clinician: Referring Kambrie Eddleman: Treating Shereena Berquist/Extender: [name], [name] in Treatment: 11 Wound Status Wound Number: 1 Primary Diabetic Wound/Ulcer of the Lower Extremity Etiology: Wound Location: Right, Lateral Lower Leg Wound Open Wounding Event:  Trauma Status: Date Acquired: 04/10/2019 Comorbid Lymphedema, Sleep Apnea, Arrhythmia, Congestive Heart Failure, Weeks Of Treatment: 11 History: Hypertension, Peripheral Arterial Disease, Type II Diabetes Clustered Wound: No Wound Measurements Length: (cm) 2.1 Width: (cm) 2.4 Depth: (cm) 0.2 Area: (cm) 3.958 Volume: (cm) 0.792 % Reduction in Area: -358.1% % Reduction in Volume: -357.8% Epithelialization: Small (1-33%) Tunneling: No Undermining: No Wound Description Classification: Grade 2 Wound Margin: Well defined, not attached Exudate Amount: Medium Exudate Type: Serosanguineous Exudate Color: red, brown Foul Odor After Cleansing: No Slough/Fibrino Yes Wound Bed Granulation Amount: Large (67-100%) Exposed Structure Granulation Quality: Pink Fascia Exposed: No Necrotic Amount: Small (1-33%) Fat Layer (Subcutaneous Tissue) Exposed: Yes Necrotic Quality: Adherent Slough Tendon Exposed: No Muscle Exposed: No Joint Exposed: No Bone Exposed: No Treatment Notes Wound #1 (Right, Lateral Lower Leg) 1. Cleanse With Wound Cleanser Soap and water 2. Periwound Care Barrier cream 3. Primary Dressing  Applied Hydrofera Blue 4. Secondary Dressing Dry Gauze 6. Support Layer Applied Kerlix/[employer] Notes normal saline to [employer]) Signed: 10/23/2019 3:56:02 PM By: [name] RN Entered By: [name] on 10/21/2019 09:22:06 -------------------------------------------------------------------------------- Vitals Details Patient Name: Date of Service: [name], [name] 10/21/2019 9:00 A M Medical Record Number: [id number] Patient Account Number: [id number] Date of Birth/Sex: Treating RN: August 05, 1965 (53 y.o. [name]) [name] Primary Care Lesslie Mossa: Precious Haws Other Clinician: Referring Remon Quinto: Treating Gus Littler/Extender: [name], [name] in Treatment: 11 Vital Signs Time Taken: 09:02 Temperature (F): 98.2 Height (in): 66 Pulse (bpm): 91 Weight (lbs): 256 Respiratory Rate (breaths/min): 18 Body Mass Index (BMI): 41.3 Blood Pressure (mmHg): 216/140 Reference Range: 80 - 120 mg / dl Electronic Signature(s) Signed: 10/23/2019 3:56:02 PM By: [name] RN Entered By: [name] on 10/21/2019 09:09:37

## 2019-10-25 ENCOUNTER — Emergency Department (HOSPITAL_COMMUNITY): Payer: Medicare Other

## 2019-10-25 ENCOUNTER — Other Ambulatory Visit: Payer: Self-pay

## 2019-10-25 ENCOUNTER — Observation Stay (HOSPITAL_COMMUNITY)
Admission: EM | Admit: 2019-10-25 | Discharge: 2019-10-27 | Disposition: A | Discharge status: Home / Self Care | Payer: Medicare Other | Attending: Internal Medicine | Admitting: Internal Medicine

## 2019-10-25 ENCOUNTER — Encounter (HOSPITAL_COMMUNITY): Payer: Self-pay | Admitting: Emergency Medicine

## 2019-10-25 DIAGNOSIS — E119 Type 2 diabetes mellitus without complications: Secondary | ICD-10-CM | POA: Diagnosis present

## 2019-10-25 DIAGNOSIS — I169 Hypertensive crisis, unspecified: Secondary | ICD-10-CM

## 2019-10-25 DIAGNOSIS — Z794 Long term (current) use of insulin: Secondary | ICD-10-CM | POA: Insufficient documentation

## 2019-10-25 DIAGNOSIS — E1110 Type 2 diabetes mellitus with ketoacidosis without coma: Secondary | ICD-10-CM | POA: Insufficient documentation

## 2019-10-25 DIAGNOSIS — I480 Paroxysmal atrial fibrillation: Secondary | ICD-10-CM | POA: Diagnosis present

## 2019-10-25 DIAGNOSIS — L97912 Non-pressure chronic ulcer of unspecified part of right lower leg with fat layer exposed: Secondary | ICD-10-CM

## 2019-10-25 DIAGNOSIS — Z7901 Long term (current) use of anticoagulants: Secondary | ICD-10-CM | POA: Insufficient documentation

## 2019-10-25 DIAGNOSIS — Z20822 Contact with and (suspected) exposure to covid-19: Secondary | ICD-10-CM | POA: Insufficient documentation

## 2019-10-25 DIAGNOSIS — Z7951 Long term (current) use of inhaled steroids: Secondary | ICD-10-CM | POA: Insufficient documentation

## 2019-10-25 DIAGNOSIS — I160 Hypertensive urgency: Secondary | ICD-10-CM | POA: Diagnosis not present

## 2019-10-25 DIAGNOSIS — L97919 Non-pressure chronic ulcer of unspecified part of right lower leg with unspecified severity: Secondary | ICD-10-CM | POA: Diagnosis not present

## 2019-10-25 DIAGNOSIS — N185 Chronic kidney disease, stage 5: Secondary | ICD-10-CM | POA: Diagnosis present

## 2019-10-25 DIAGNOSIS — L97812 Non-pressure chronic ulcer of other part of right lower leg with fat layer exposed: Secondary | ICD-10-CM | POA: Insufficient documentation

## 2019-10-25 DIAGNOSIS — Z79899 Other long term (current) drug therapy: Secondary | ICD-10-CM | POA: Insufficient documentation

## 2019-10-25 DIAGNOSIS — I10 Essential (primary) hypertension: Secondary | ICD-10-CM | POA: Diagnosis present

## 2019-10-25 DIAGNOSIS — N184 Chronic kidney disease, stage 4 (severe): Secondary | ICD-10-CM | POA: Diagnosis not present

## 2019-10-25 DIAGNOSIS — E1165 Type 2 diabetes mellitus with hyperglycemia: Secondary | ICD-10-CM

## 2019-10-25 DIAGNOSIS — E1122 Type 2 diabetes mellitus with diabetic chronic kidney disease: Secondary | ICD-10-CM | POA: Diagnosis not present

## 2019-10-25 DIAGNOSIS — E1140 Type 2 diabetes mellitus with diabetic neuropathy, unspecified: Secondary | ICD-10-CM

## 2019-10-25 DIAGNOSIS — I129 Hypertensive chronic kidney disease with stage 1 through stage 4 chronic kidney disease, or unspecified chronic kidney disease: Secondary | ICD-10-CM | POA: Insufficient documentation

## 2019-10-25 DIAGNOSIS — H7091 Unspecified mastoiditis, right ear: Secondary | ICD-10-CM

## 2019-10-25 DIAGNOSIS — R531 Weakness: Secondary | ICD-10-CM | POA: Diagnosis present

## 2019-10-25 DIAGNOSIS — H6591 Unspecified nonsuppurative otitis media, right ear: Secondary | ICD-10-CM | POA: Diagnosis present

## 2019-10-25 DIAGNOSIS — Z992 Dependence on renal dialysis: Secondary | ICD-10-CM | POA: Diagnosis not present

## 2019-10-25 DIAGNOSIS — E1149 Type 2 diabetes mellitus with other diabetic neurological complication: Secondary | ICD-10-CM | POA: Diagnosis present

## 2019-10-25 LAB — URINALYSIS, ROUTINE W REFLEX MICROSCOPIC
Bilirubin Urine: NEGATIVE
Glucose, UA: >=500 mg/dL — AB
Hgb urine dipstick: NEGATIVE
Ketones, ur: NEGATIVE mg/dL
Leukocytes,Ua: NEGATIVE
Nitrite: NEGATIVE
Protein, ur: >=300 mg/dL — AB
Specific Gravity, Urine: 1.012 (ref 1.005–1.030)
pH: 6 (ref 5.0–8.0)

## 2019-10-25 LAB — CBC
HCT: 35.5 % — ABNORMAL LOW (ref 39.0–52.0)
Hemoglobin: 11.4 g/dL — ABNORMAL LOW (ref 13.0–17.0)
MCH: 28.3 pg (ref 26.0–34.0)
MCHC: 32.1 g/dL (ref 30.0–36.0)
MCV: 88.1 fL (ref 80.0–100.0)
Platelets: 336 10*3/uL (ref 150–400)
RBC: 4.03 MIL/uL — ABNORMAL LOW (ref 4.22–5.81)
RDW: 13 % (ref 11.5–15.5)
WBC: 8.8 10*3/uL (ref 4.0–10.5)
nRBC: 0 % (ref 0.0–0.2)

## 2019-10-25 LAB — RAPID URINE DRUG SCREEN, HOSP PERFORMED
Amphetamines: NOT DETECTED
Barbiturates: NOT DETECTED
Benzodiazepines: NOT DETECTED
Cocaine: NOT DETECTED
Opiates: NOT DETECTED
Tetrahydrocannabinol: NOT DETECTED

## 2019-10-25 LAB — BASIC METABOLIC PANEL
Anion gap: 11 (ref 5–15)
BUN: 61 mg/dL — ABNORMAL HIGH (ref 6–20)
CO2: 21 mmol/L — ABNORMAL LOW (ref 22–32)
Calcium: 8.9 mg/dL (ref 8.9–10.3)
Chloride: 101 mmol/L (ref 98–111)
Creatinine, Ser: 7.85 mg/dL — ABNORMAL HIGH (ref 0.61–1.24)
GFR calc Af Amer: 8 mL/min — ABNORMAL LOW (ref >60)
GFR calc non Af Amer: 7 mL/min — ABNORMAL LOW (ref >60)
Glucose, Bld: 191 mg/dL — ABNORMAL HIGH (ref 70–99)
Potassium: 3.8 mmol/L (ref 3.5–5.1)
Sodium: 133 mmol/L — ABNORMAL LOW (ref 135–145)

## 2019-10-25 LAB — TROPONIN I (HIGH SENSITIVITY)
Troponin I (High Sensitivity): 47 ng/L — ABNORMAL HIGH (ref <18)
Troponin I (High Sensitivity): 44 ng/L — ABNORMAL HIGH (ref <18)

## 2019-10-25 LAB — BRAIN NATRIURETIC PEPTIDE: B Natriuretic Peptide: 100.2 pg/mL — ABNORMAL HIGH (ref 0.0–100.0)

## 2019-10-25 LAB — SARS CORONAVIRUS 2 BY RT PCR (HOSPITAL ORDER, PERFORMED IN ~~LOC~~ HOSPITAL LAB): SARS Coronavirus 2: NEGATIVE

## 2019-10-25 LAB — CBG MONITORING, ED: Glucose-Capillary: 204 mg/dL — ABNORMAL HIGH (ref 70–99)

## 2019-10-25 MED ORDER — LABETALOL HCL 5 MG/ML IV SOLN
20.0000 mg | Freq: Once | INTRAVENOUS | Status: DC
  Filled 2019-10-25: qty 4

## 2019-10-25 MED ORDER — FUROSEMIDE 80 MG PO TABS
120.0000 mg | ORAL_TABLET | Freq: Every day | ORAL | Status: DC
  Administered 2019-10-26 – 2019-10-27 (×2): 120 mg via ORAL
  Filled 2019-10-25: qty 1
  Filled 2019-10-25: qty 6

## 2019-10-25 MED ORDER — HYDRALAZINE HCL 20 MG/ML IJ SOLN
20.0000 mg | INTRAMUSCULAR | Status: DC | PRN
  Administered 2019-10-25 – 2019-10-26 (×2): 20 mg via INTRAVENOUS
  Filled 2019-10-25 (×2): qty 1

## 2019-10-25 MED ORDER — HYDROMORPHONE HCL 1 MG/ML IJ SOLN
1.0000 mg | Freq: Once | INTRAMUSCULAR | Status: AC
  Administered 2019-10-25: 1 mg via INTRAVENOUS
  Filled 2019-10-25: qty 1

## 2019-10-25 MED ORDER — AMLODIPINE BESYLATE 10 MG PO TABS
10.0000 mg | ORAL_TABLET | Freq: Every day | ORAL | Status: DC
  Administered 2019-10-26 – 2019-10-27 (×2): 10 mg via ORAL
  Filled 2019-10-25: qty 2
  Filled 2019-10-25: qty 1

## 2019-10-25 MED ORDER — TIZANIDINE HCL 2 MG PO TABS
2.0000 mg | ORAL_TABLET | Freq: Three times a day (TID) | ORAL | Status: DC | PRN
  Filled 2019-10-25: qty 1

## 2019-10-25 MED ORDER — HYDROMORPHONE HCL 1 MG/ML IJ SOLN: 1.0000 mg | INTRAMUSCULAR | Status: DC | PRN

## 2019-10-25 MED ORDER — CLONIDINE HCL 0.2 MG/24HR TD PTWK: 0.2000 mg | MEDICATED_PATCH | TRANSDERMAL | Status: DC

## 2019-10-25 MED ORDER — LABETALOL HCL 200 MG PO TABS
200.0000 mg | ORAL_TABLET | Freq: Two times a day (BID) | ORAL | Status: DC
  Administered 2019-10-26 – 2019-10-27 (×3): 200 mg via ORAL
  Filled 2019-10-25 (×3): qty 1

## 2019-10-25 MED ORDER — HYDRALAZINE HCL 20 MG/ML IJ SOLN
20.0000 mg | Freq: Once | INTRAMUSCULAR | Status: AC
  Administered 2019-10-25: 20 mg via INTRAVENOUS
  Filled 2019-10-25: qty 1

## 2019-10-25 MED ORDER — SODIUM CHLORIDE 0.9% FLUSH
3.0000 mL | Freq: Two times a day (BID) | INTRAVENOUS | Status: DC
  Administered 2019-10-26: 3 mL via INTRAVENOUS

## 2019-10-25 MED ORDER — SENNOSIDES-DOCUSATE SODIUM 8.6-50 MG PO TABS: 1.0000 | ORAL_TABLET | Freq: Every evening | ORAL | Status: DC | PRN

## 2019-10-25 MED ORDER — OXYCODONE HCL 5 MG PO TABS
5.0000 mg | ORAL_TABLET | Freq: Four times a day (QID) | ORAL | Status: DC | PRN
  Administered 2019-10-26 – 2019-10-27 (×3): 5 mg via ORAL
  Filled 2019-10-25 (×3): qty 1

## 2019-10-25 MED ORDER — FLUTICASONE PROPIONATE 50 MCG/ACT NA SUSP
1.0000 | Freq: Two times a day (BID) | NASAL | Status: DC
  Administered 2019-10-26: 1 via NASAL
  Filled 2019-10-25: qty 16

## 2019-10-25 MED ORDER — SODIUM CHLORIDE 0.9% FLUSH: 3.0000 mL | Freq: Once | INTRAVENOUS | Status: DC

## 2019-10-25 MED ORDER — LABETALOL HCL 5 MG/ML IV SOLN
20.0000 mg | Freq: Once | INTRAVENOUS | Status: AC
  Administered 2019-10-25: 20 mg via INTRAVENOUS

## 2019-10-25 MED ORDER — INSULIN GLARGINE 100 UNIT/ML ~~LOC~~ SOLN
7.0000 [IU] | Freq: Every day | SUBCUTANEOUS | Status: DC
  Administered 2019-10-25 – 2019-10-26 (×2): 7 [IU] via SUBCUTANEOUS
  Filled 2019-10-25 (×3): qty 0.07

## 2019-10-25 MED ORDER — INSULIN ASPART 100 UNIT/ML ~~LOC~~ SOLN
0.0000 [IU] | Freq: Three times a day (TID) | SUBCUTANEOUS | Status: DC
  Administered 2019-10-26 – 2019-10-27 (×2): 1 [IU] via SUBCUTANEOUS

## 2019-10-25 MED ORDER — SODIUM CHLORIDE 0.9% FLUSH
3.0000 mL | Freq: Two times a day (BID) | INTRAVENOUS | Status: DC
  Administered 2019-10-26 – 2019-10-27 (×2): 3 mL via INTRAVENOUS

## 2019-10-25 MED ORDER — ONDANSETRON HCL 4 MG/2ML IJ SOLN: 4.0000 mg | Freq: Four times a day (QID) | INTRAMUSCULAR | Status: DC | PRN

## 2019-10-25 MED ORDER — LABETALOL HCL 200 MG PO TABS
200.0000 mg | ORAL_TABLET | Freq: Once | ORAL | Status: AC
  Administered 2019-10-25: 200 mg via ORAL
  Filled 2019-10-25: qty 1

## 2019-10-25 MED ORDER — SODIUM CHLORIDE 0.9% FLUSH: 3.0000 mL | INTRAVENOUS | Status: DC | PRN

## 2019-10-25 MED ORDER — ACETAMINOPHEN 650 MG RE SUPP: 650.0000 mg | Freq: Four times a day (QID) | RECTAL | Status: DC | PRN

## 2019-10-25 MED ORDER — AMOXICILLIN-POT CLAVULANATE 250-125 MG PO TABS
1.0000 | ORAL_TABLET | Freq: Two times a day (BID) | ORAL | Status: DC
  Administered 2019-10-25 – 2019-10-27 (×4): 1 via ORAL
  Filled 2019-10-25 (×4): qty 1

## 2019-10-25 MED ORDER — ONDANSETRON HCL 4 MG PO TABS: 4.0000 mg | ORAL_TABLET | Freq: Four times a day (QID) | ORAL | Status: DC | PRN

## 2019-10-25 MED ORDER — APIXABAN 5 MG PO TABS
5.0000 mg | ORAL_TABLET | Freq: Two times a day (BID) | ORAL | Status: DC
  Administered 2019-10-25 – 2019-10-27 (×4): 5 mg via ORAL
  Filled 2019-10-25 (×5): qty 1

## 2019-10-25 MED ORDER — SODIUM CHLORIDE 0.9 % IV SOLN
1.0000 g | Freq: Once | INTRAVENOUS | Status: AC
  Administered 2019-10-25: 1 g via INTRAVENOUS
  Filled 2019-10-25: qty 10

## 2019-10-25 MED ORDER — ALBUTEROL SULFATE HFA 108 (90 BASE) MCG/ACT IN AERS
2.0000 | INHALATION_SPRAY | Freq: Four times a day (QID) | RESPIRATORY_TRACT | Status: DC | PRN
  Filled 2019-10-25: qty 6.7

## 2019-10-25 MED ORDER — GABAPENTIN 300 MG PO CAPS
300.0000 mg | ORAL_CAPSULE | Freq: Two times a day (BID) | ORAL | Status: DC
  Administered 2019-10-25 – 2019-10-26 (×2): 300 mg via ORAL
  Filled 2019-10-25 (×2): qty 1

## 2019-10-25 MED ORDER — ACETAMINOPHEN 325 MG PO TABS: 650.0000 mg | ORAL_TABLET | Freq: Four times a day (QID) | ORAL | Status: DC | PRN

## 2019-10-25 MED ORDER — SODIUM CHLORIDE 0.9 % IV SOLN: 250.0000 mL | INTRAVENOUS | Status: DC | PRN

## 2019-10-25 MED ORDER — HYDRALAZINE HCL 25 MG PO TABS: 25.0000 mg | ORAL_TABLET | Freq: Three times a day (TID) | ORAL | Status: DC

## 2019-10-25 NOTE — ED Notes (Signed)
Patient transported to CT 

## 2019-10-25 NOTE — H&P (Signed)
History and Physical    [name] KJZ:[id number] DOB: 09/11/1965 DOA: 10/25/2019  PCP: Bartholome Bill, MD   Patient coming from: Home   Chief Complaint: Uncontrolled BP, right leg wound, right ear discomfort  HPI: [name] is a 54 y.o. male with medical history significant for chronic kidney disease stage V not yet on dialysis, insulin-dependent diabetes mellitus, malignant hypertension, paroxysmal atrial fibrillation on Eliquis, and chronic right leg ulcer, now presenting to the emergency department for evaluation of uncontrolled blood pressure, increasing pain at his right leg wound, and sensation of fluid in his right ear.  Patient reports that he has been in discussion with his outpatient physicians regarding initiating dialysis but has never been dialyzed to date.  He has had increasing difficulty controlling his blood pressure at home for the past couple weeks despite reported adherence with his outpatient regimen that includes 5 antihypertensive agents.  For approximately 2 months, he has had a sensation of fluid in his right ear and feels off balance at times.  He denies any headache, fevers, chills, change in hearing, chest pain, or flank pain.  He has not been on any antibiotics recently.  ED Course: Upon arrival to the ED, patient is found to be afebrile, saturating well on room air, and with blood pressure 226/133.  EKG features a normal sinus rhythm.  Noncontrast head CT is negative for acute intracranial abnormality but notable for right mastoid and middle ear effusions.  Chest x-rays negative for acute cardiopulmonary disease.  Radiographs of the right lower extremity are notable for soft tissue swelling without acute osseous abnormality.  Chemistry panel notable for creatinine of 7.85, up from 7.14 last month.  Troponin is 47, BNP 100, UDS negative, and urinalysis notable for glucosuria and proteinuria.  Patient was treated with Dilaudid, IV and oral labetalol, IV  hydralazine, and Rocephin in the ED.  Review of Systems:  All other systems reviewed and apart from HPI, are negative.  Past Medical History:  Diagnosis Date  . Acute kidney injury ([place]) 12/28/2014  . Adjustment disorder with mixed anxiety and depressed mood 12/28/2014  . CPAP (continuous positive airway pressure) dependence   . Diabetes mellitus without complication ([place])   . DM (diabetes mellitus) type II controlled with renal manifestation ([place]) 12/28/2014  . DM (diabetes mellitus) type II controlled, neurological manifestation ([place]) 12/28/2014  . Encephalopathy, hypertensive 12/28/2014  . Hypertension   . Hypertensive emergency without congestive heart failure 12/27/2014  . Possible Panic disorder 12/28/2014  . Renal disorder   . 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 Sleep apnea     Past Surgical History:  Procedure Laterality Date  . EMPYEMA DRAINAGE Right 05/17/2017   Procedure: EMPYEMA DRAINAGE AND DECORTICATION;  Surgeon: Grace Isaac, MD;  Location: [facility];  Service: Thoracic;  Laterality: Right;  . FLEXIBLE BRONCHOSCOPY  05/17/2017   Procedure: FLEXIBLE BRONCHOSCOPY;  Surgeon: Grace Isaac, MD;  Location: [facility];  Service: Thoracic;;  . IR THORACENTESIS ASP PLEURAL SPACE W/IMG GUIDE  05/17/2017  . NO PAST SURGERIES    . THORACOTOMY/LOBECTOMY Right 05/17/2017   Procedure: RIGHT MINI THORACOTOMY;  Surgeon: Grace Isaac, MD;  Location: [facility];  Service: Thoracic;  Laterality: Right;  Marland Kitchen VIDEO ASSISTED THORACOSCOPY (VATS)/EMPYEMA Right 05/17/2017   Procedure: RIGHT VIDEO ASSISTED THORACOSCOPY (VATS)/EMPYEMA;  Surgeon: Grace Isaac, MD;  Location: [facility];  Service: Thoracic;  Laterality: Right;    Social History:   reports that he has never smoked. He has never used smokeless  tobacco. He reports that he does not drink alcohol and does not use drugs.  No Known Allergies  Family History  Problem Relation Age of Onset  . Hypertension Mother      Prior to Admission  medications   Medication Sig Start Date End Date Taking? Authorizing Provider  acetaminophen (TYLENOL) 500 MG tablet Take 1,000-1,500 mg by mouth 2 (two) times daily as needed (pain).   Yes [provider]  albuterol (PROVENTIL HFA;VENTOLIN HFA) 108 (90 BASE) MCG/ACT inhaler Inhale 2 puffs into the lungs every 6 (six) hours as needed for wheezing or shortness of breath.    Yes [provider]  amLODipine (NORVASC) 10 MG tablet Take 1 tablet (10 mg total) by mouth daily. 12/29/14  Yes Smiley Houseman, MD  apixaban (ELIQUIS) 5 MG TABS tablet Take 5 mg by mouth 2 (two) times daily.   Yes [provider]  cloNIDine (CATAPRES - DOSED IN MG/24 HR) 0.2 mg/24hr patch Place 1 patch (0.2 mg total) onto the skin once a week. Patient taking differently: Place 0.2 mg onto the skin every Saturday.  05/22/17  Yes Orson Eva J, DO  fluticasone (FLONASE) 50 MCG/ACT nasal spray Place 1 spray into both nostrils 2 (two) times daily. 10/06/19  Yes [provider]  furosemide (LASIX) 80 MG tablet Take 120 mg by mouth daily.   Yes [provider]  gabapentin (NEURONTIN) 300 MG capsule Take 300 mg by mouth 2 (two) times daily.   Yes [provider]  hydrALAZINE (APRESOLINE) 25 MG tablet Take 25 mg by mouth 3 (three) times daily. 07/16/19  Yes [provider]  insulin NPH-regular Human (70-30) 100 UNIT/ML injection Inject 35 Units into the skin 2 (two) times daily before a meal.   Yes [provider]  labetalol (NORMODYNE) 200 MG tablet Take 200 mg by mouth 2 (two) times daily.  12/14/15  Yes [provider]  levocetirizine (XYZAL) 5 MG tablet Take 5 mg by mouth at bedtime. 10/06/19  Yes [provider]  Multiple Vitamin (MULTIVITAMIN WITH MINERALS) TABS tablet Take 1 tablet by mouth daily. Centrum   Yes [provider]  potassium chloride SA (K-DUR,KLOR-CON) 20 MEQ tablet Take 20 mEq by mouth every morning.    Yes [provider]  potassium chloride SA (KLOR-CON M15) 15 MEQ tablet Take 15 mEq by mouth at bedtime.   Yes [provider]  tiZANidine (ZANAFLEX) 4 MG tablet Take 4 mg by mouth every 8 (eight) hours as needed for muscle spasms.  04/08/17  Yes [provider]  traMADol (ULTRAM) 50 MG tablet Take 50 mg by mouth 3 (three) times daily as needed (pain).  08/17/19  Yes [provider]  Vitamin D, Ergocalciferol, (DRISDOL) 1.25 MG (50000 UNIT) CAPS capsule Take 50,000 Units by mouth every Sunday. 08/17/19  Yes [provider]    Physical Exam: Vitals:   10/25/19 2024 10/25/19 2129 10/25/19 2200 10/25/19 2203  BP: (!) 226/133 (!) 206/125 137/74 137/74  Pulse: 79  81 83  Resp: _0 Temp:    98.1 F (36.7 C)  TempSrc:    Oral  SpO2: 94%  100% 98%  Weight:      Height:        Constitutional: NAD, calm  Eyes: PERTLA, lids and conjunctivae normal ENMT: Right TM intact and bulging with clear-yellow fluid, no tenderness with manipulation of external hear. Mucous membranes are moist.    Neck: normal, supple, no masses,  no thyromegaly Respiratory: no wheezing, no crackles. No accessory muscle use.  Cardiovascular: S1 & S2 heard, regular rate and rhythm. Mild lower leg edema bilaterally.   Abdomen: No distension, no tenderness, soft. Bowel sounds active.  Musculoskeletal: no clubbing / cyanosis. No joint deformity upper and lower extremities.   Skin: ulceration lower right leg. Warm, dry, well-perfused. Neurologic: CN 2-12 grossly intact. Sensation intact. Moving all extremities.  Psychiatric: Alert and oriented to person, place, and situation. Pleasant and cooperative.    Labs and Imaging on Admission: I have personally reviewed following labs and imaging studies  CBC: Recent Labs  Lab 10/25/19 1407  WBC 8.8  HGB 11.4*  HCT 35.5*  MCV 88.1  PLT 202   Basic Metabolic Panel: Recent Labs  Lab 10/25/19 1407  NA 133*  K 3.8  CL 101  CO2 21*    GLUCOSE 191*  BUN 61*  CREATININE 7.85*  CALCIUM 8.9   GFR: Estimated Creatinine Clearance: 13.1 mL/min (A) (by C-G formula based on SCr of 7.85 mg/dL (H)). Liver Function Tests: No results for input(s): AST, ALT, ALKPHOS, BILITOT, PROT, ALBUMIN in the last 168 hours. No results for input(s): LIPASE, AMYLASE in the last 168 hours. No results for input(s): AMMONIA in the last 168 hours. Coagulation Profile: No results for input(s): INR, PROTIME in the last 168 hours. Cardiac Enzymes: No results for input(s): CKTOTAL, CKMB, CKMBINDEX, TROPONINI in the last 168 hours. BNP (last 3 results) No results for input(s): PROBNP in the last 8760 hours. HbA1C: No results for input(s): HGBA1C in the last 72 hours. CBG: Recent Labs  Lab 10/25/19 1641  GLUCAP 204*   Lipid Profile: No results for input(s): CHOL, HDL, LDLCALC, TRIG, CHOLHDL, LDLDIRECT in the last 72 hours. Thyroid Function Tests: No results for input(s): TSH, T4TOTAL, FREET4, T3FREE, THYROIDAB in the last 72 hours. Anemia Panel: No results for input(s): VITAMINB12, FOLATE, FERRITIN, TIBC, IRON, RETICCTPCT in the last 72 hours. Urine analysis:    Component Value Date/Time   COLORURINE YELLOW 10/25/2019 2014   APPEARANCEUR CLEAR 10/25/2019 2014   LABSPEC 1.012 10/25/2019 2014   PHURINE 6.0 10/25/2019 2014   GLUCOSEU >=500 (A) 10/25/2019 2014   HGBUR NEGATIVE 10/25/2019 2014   BILIRUBINUR NEGATIVE 10/25/2019 2014   [place] NEGATIVE 10/25/2019 2014   PROTEINUR >=300 (A) 10/25/2019 2014   UROBILINOGEN 0.2 03/26/2014 1626   NITRITE NEGATIVE 10/25/2019 2014   LEUKOCYTESUR NEGATIVE 10/25/2019 2014   Sepsis Labs: _0 (procalcitonin:4,lacticidven:4) )No results found for this or any previous visit (from the past 240 hour(s)).   Radiological Exams on Admission: DG Tibia/Fibula Right  Result Date: 10/25/2019 CLINICAL DATA:  Chronic wound to the right distal tib fib. EXAM: RIGHT TIBIA AND FIBULA - 2 VIEW COMPARISON:   07/30/2019 FINDINGS: There is a chronic appearing defect involving the distal aspect of the tibia. There is nonspecific soft tissue swelling about the lower extremity. No radiopaque foreign body. No radiographic evidence for osteomyelitis. IMPRESSION: Soft tissue swelling about the lower extremity without evidence for an acute displaced fracture or dislocation. No radiographic evidence for osteomyelitis. Electronically Signed   By: Constance Holster M.D.   On: 10/25/2019 19:32   CT Head Wo Contrast  Result Date: 10/25/2019 CLINICAL DATA:  Weakness for 2 weeks, high blood pressure the EXAM: CT HEAD WITHOUT CONTRAST TECHNIQUE: Contiguous axial images were obtained from the base of the skull through the vertex without intravenous contrast. COMPARISON:  CT head 04/04/2017 FINDINGS: Brain: No evidence of acute infarction, hemorrhage, hydrocephalus, extra-axial collection  or mass lesion/mass effect. Symmetric prominence of the ventricles, cisterns and sulci compatible with mild senescent parenchymal volume loss. Patchy areas of white matter hypoattenuation are most compatible with chronic microvascular angiopathy. Vascular: Atherosclerotic calcification of the carotid siphons. No hyperdense vessel. Skull: No calvarial fracture or suspicious osseous lesion. No scalp swelling or hematoma. Sinuses/Orbits: Minimal thickening in the maxillary and ethmoid sinuses. Right concha bullosa. Right mastoid and middle ear effusion is noted. Left mastoid air cells are well aerated. Pneumatization of the petrous apices including opacified right petrous apex. Included orbital structures are unremarkable. Other: None IMPRESSION: 1. No acute intracranial abnormality. 2. Mild senescent parenchymal volume loss and chronic microvascular angiopathy. 3. Right mastoid and middle ear effusion including opacification of the pneumatized right petrous apex. Correlate for clinical features of otomastoiditis. Electronically Signed   By: Lovena Le M.D.   On: 10/25/2019 19:21   DG Chest Port 1 View  Result Date: 10/25/2019 CLINICAL DATA:  Hypertension. EXAM: PORTABLE CHEST 1 VIEW COMPARISON:  07/27/2017 FINDINGS: The heart size is mildly enlarged. The lung volumes are low. There is atelectasis at the lung bases. There is no pneumothorax or large pleural effusion. There is no evidence for an acute osseous abnormality. IMPRESSION: No active disease. Electronically Signed   By: Constance Holster M.D.   On: 10/25/2019 20:14    EKG: Independently reviewed. Sinus rhythm.   Assessment/Plan   1. Hypertensive urgency  - Patient reports SBP in 200 range at home past 2 weeks despite adherence with his antihypertensives, has not been symptomatic, and has BP as high as 226/133 in ED  - He was given oral and IV labetalol, IV hydralazine, and pain control in ED with BP down to 137/74  - Avoid over-aggressive lowering, aim for 180/100 range next several hours   2. CKD V  - SCr is 7.85 on admission, up from 7.14 last month  - BP difficult to control and appears hypervolemic but no hyperkalemia, SOB, uremia, or significant acidosis  - Renally-dose medications, check phosphorus, monitor    3. Insulin-dependent DM  - A1c was >15% in 2019  - Continue CBG checks and insulin, update A1c    4. Chronic right leg ulcer  - Does not appear acutely infected, no underlying osseous abnormality on plain films  - Continue wound care    5. Right middle ear and mastoid effusion  - Patient reports 2 months of sensation of fluid in right ear and ear pain without fever  - There is effusion involving the middle ear and mastoid on CT  - TM is intact and bulging with clear-yellow fluid  - Not clear that this is a bacterial infection but given his high-risk for complications, will treat with Augmentin, continue Flonase for likely eustachian tube dysfunction    6. Paroxysmal atrial fibrillation  - In sinus rhythm on admission  - CHADS-VASc is 3 (DM, HTN,  HFpEF)  - Continue Eliquis    DVT prophylaxis: Eliquis  Code Status: Full  Family Communication: Discussed with patient  Disposition Plan:  Patient is from: Home  Anticipated d/c is to: Home  Anticipated d/c date is: 10/26/19 Patient currently: requiring IV agents for BP control  Consults called: None  Admission status: Observation      Vianne Bulls, MD [facility]  10/25/2019, 10:21 PM

## 2019-10-25 NOTE — ED Notes (Signed)
Reported to EDP Pfeiffer  Pt's SBP 200. Pt 's family in room reported Pt's BP is 200 at home. EDP also was informed pt was asking for pain meds .

## 2019-10-25 NOTE — ED Triage Notes (Addendum)
C/o generalized weakness x 2 weeks and high blood pressure.    States he is taking medications as instructed.

## 2019-10-25 NOTE — ED Notes (Signed)
Pt given a sandwich and water.

## 2019-10-25 NOTE — ED Notes (Signed)
Pt stated he already went to the bathroom and is unable to provide a urine sample at this time

## 2019-10-25 NOTE — ED Notes (Signed)
Pt c/o 8/10 pain in right foot. A approx 3cm circular wound found. Per order xeroforom and kerlex dressing applied.

## 2019-10-25 NOTE — ED Provider Notes (Signed)
[facility] EMERGENCY DEPARTMENT Provider Note   CSN: [id number] Arrival date & time: 10/25/19  1322     History Chief Complaint  Patient presents with   Weakness    [name] is a 54 y.o. male.  HPI Patient reports that his blood pressure has been elevated.  He reports he has been taking his medications.  He is wearing his clonidine patch and reports he took his a.m. dose of labetalol and Norvasc.  He believes his blood pressure becomes elevated and uncontrolled when he is in pain with a chronic right lower leg wound.  He has tramadol for pain but does not feel that it helps much.  His wife reports that it seems to make him sleepy and he spends a lot of the day resting.  This wound is being cared for by wound care with dressing changes.  Its been there for several months.  No significant changes in the wound.  Reportedly it started as a fairly small puncture wound that would not heal, but has continued to expand over time and is now about the size of a quarter.  Patient reports he is also felt somewhat dizzy and perceived a water rushing sound in his right ear.  This is particularly pronounced with certain position changes of his head.  No fevers chills or generalized headache.  Patient denies he is having any problems with his vision.  Some nausea but no vomiting.  No focal weakness numbness or tingling of extremities.  He denies he is having chest pain or shortness of breath. Patient has chronic kidney disease.  He reports that Dr. Has discussed preparing for dialysis but is not yet on dialysis.    Past Medical History:  Diagnosis Date   Acute kidney injury ([place]) 12/28/2014   Adjustment disorder with mixed anxiety and depressed mood 12/28/2014   CPAP (continuous positive airway pressure) dependence    Diabetes mellitus without complication ([place])    DM (diabetes mellitus) type II controlled with renal manifestation ([place]) 12/28/2014   DM (diabetes mellitus) type  II controlled, neurological manifestation ([place]) 12/28/2014   Encephalopathy, hypertensive 12/28/2014   Hypertension    Hypertensive emergency without congestive heart failure 12/27/2014   Possible Panic disorder 12/28/2014   Renal disorder    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   Sleep apnea     Patient Active Problem List   Diagnosis Date Noted   Hypertensive crisis 10/25/2019   Chronic ulcer of right leg ([place]) 10/25/2019   AKI (acute kidney injury) ([place]) 07/28/2017   Right sided Flank pain 07/27/2017   Weakness 07/27/2017   Myalgia 07/27/2017   Pneumonia of right lung due to infectious organism    Pleural effusion    Uncontrolled hypertension    Post-operative pain    Diabetes mellitus type 2 in nonobese (HCC)    Acute diastolic congestive heart failure (HCC)    Encephalopathy    Stage 4 chronic kidney disease ([place])    SIRS (systemic inflammatory response syndrome) ([place])    Chest tube in place    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[place])    Postoperative pain    Pleural effusion on right 05/17/2017   Empyema ([place]) 05/16/2017   Acute kidney injury ([place]) 12/28/2014   Type 2 diabetes mellitus with hyperglycemia, with long-term current use of insulin ([place]) 12/28/2014   DM (diabetes mellitus) type II controlled, neurological manifestation ([place]) 12/28/2014   Adjustment disorder with mixed anxiety and depressed mood 12/28/2014   Possible Panic disorder 12/28/2014  Hypertensive emergency without congestive heart failure 12/27/2014   Hyponatremia 06/27/2013   Essential hypertension, malignant 06/27/2013   Hyperglycemia 06/26/2013   DKA (diabetic ketoacidoses) ([place]) 06/26/2013   Severe uncontrolled hypertension 03/31/2011   Hypokalemia 03/28/2011   Essential hypertension 03/28/2011   CKD (chronic kidney disease), stage V ([place]) 03/28/2011    Past Surgical History:  Procedure Laterality Date   EMPYEMA DRAINAGE Right 05/17/2017   Procedure: EMPYEMA DRAINAGE AND  DECORTICATION;  Surgeon: Grace Isaac, MD;  Location: [facility] OR;  Service: Thoracic;  Laterality: Right;   FLEXIBLE BRONCHOSCOPY  05/17/2017   Procedure: FLEXIBLE BRONCHOSCOPY;  Surgeon: Grace Isaac, MD;  Location: [facility];  Service: Thoracic;;   IR THORACENTESIS ASP PLEURAL SPACE W/IMG GUIDE  05/17/2017   NO PAST SURGERIES     THORACOTOMY/LOBECTOMY Right 05/17/2017   Procedure: RIGHT MINI THORACOTOMY;  Surgeon: Grace Isaac, MD;  Location: [facility];  Service: Thoracic;  Laterality: Right;   VIDEO ASSISTED THORACOSCOPY (VATS)/EMPYEMA Right 05/17/2017   Procedure: RIGHT VIDEO ASSISTED THORACOSCOPY (VATS)/EMPYEMA;  Surgeon: Grace Isaac, MD;  Location: [facility];  Service: Thoracic;  Laterality: Right;       Family History  Problem Relation Age of Onset   Hypertension Mother     Social History   Tobacco Use   Smoking status: Never Smoker   Smokeless tobacco: Never Used  Substance Use Topics   Alcohol use: No   Drug use: No    Home Medications Prior to Admission medications   Medication Sig Start Date End Date Taking? Authorizing Provider  acetaminophen (TYLENOL) 500 MG tablet Take 1,000-1,500 mg by mouth 2 (two) times daily as needed (pain).   Yes [provider]  albuterol (PROVENTIL HFA;VENTOLIN HFA) 108 (90 BASE) MCG/ACT inhaler Inhale 2 puffs into the lungs every 6 (six) hours as needed for wheezing or shortness of breath.    Yes [provider]  amLODipine (NORVASC) 10 MG tablet Take 1 tablet (10 mg total) by mouth daily. 12/29/14  Yes Smiley Houseman, MD  apixaban (ELIQUIS) 5 MG TABS tablet Take 5 mg by mouth 2 (two) times daily.   Yes [provider]  cloNIDine (CATAPRES - DOSED IN MG/24 HR) 0.2 mg/24hr patch Place 1 patch (0.2 mg total) onto the skin once a week. Patient taking differently: Place 0.2 mg onto the skin every Saturday.  05/22/17  Yes Orson Eva J, DO  fluticasone (FLONASE) 50 MCG/ACT nasal spray Place 1 spray into  both nostrils 2 (two) times daily. 10/06/19  Yes [provider]  furosemide (LASIX) 80 MG tablet Take 120 mg by mouth daily.   Yes [provider]  gabapentin (NEURONTIN) 300 MG capsule Take 300 mg by mouth 2 (two) times daily.   Yes [provider]  hydrALAZINE (APRESOLINE) 25 MG tablet Take 25 mg by mouth 3 (three) times daily. 07/16/19  Yes [provider]  insulin NPH-regular Human (70-30) 100 UNIT/ML injection Inject 35 Units into the skin 2 (two) times daily before a meal.   Yes [provider]  labetalol (NORMODYNE) 200 MG tablet Take 200 mg by mouth 2 (two) times daily.  12/14/15  Yes [provider]  levocetirizine (XYZAL) 5 MG tablet Take 5 mg by mouth at bedtime. 10/06/19  Yes [provider]  Multiple Vitamin (MULTIVITAMIN WITH MINERALS) TABS tablet Take 1 tablet by mouth daily. Centrum   Yes [provider]  potassium chloride SA (K-DUR,KLOR-CON) 20 MEQ tablet Take 20 mEq by mouth every  morning.    Yes [provider]  potassium chloride SA (KLOR-CON M15) 15 MEQ tablet Take 15 mEq by mouth at bedtime.   Yes [provider]  tiZANidine (ZANAFLEX) 4 MG tablet Take 4 mg by mouth every 8 (eight) hours as needed for muscle spasms.  04/08/17  Yes [provider]  traMADol (ULTRAM) 50 MG tablet Take 50 mg by mouth 3 (three) times daily as needed (pain).  08/17/19  Yes [provider]  Vitamin D, Ergocalciferol, (DRISDOL) 1.25 MG (50000 UNIT) CAPS capsule Take 50,000 Units by mouth every Sunday. 08/17/19  Yes [provider]    Allergies    Patient has no known allergies.  Review of Systems   Review of Systems 10 systems reviewed and negative except as per HPI Physical Exam Updated Vital Signs BP (!) 206/125    Pulse 79    Temp 98 F (36.7 C) (Oral)    Resp 12    Ht _0  (1.702 m)    Wt 111.1 kg    SpO2 94%    BMI 38.37 kg/m   Physical Exam Constitutional:      Comments:  Alert and nontoxic.  Mental status is clear.  No respiratory distress at rest.  HENT:     Head: Normocephalic and atraumatic.     Comments: Patient does not have pronounced swelling or redness behind the right ear.  No changes in the pinna.  Bilateral TMs symmetric in appearance no erythema.  No significant bulging.    Nose: Nose normal.     Mouth/Throat:     Mouth: Mucous membranes are moist.     Pharynx: Oropharynx is clear.  Eyes:     Extraocular Movements: Extraocular movements intact.     Pupils: Pupils are equal, round, and reactive to light.  Cardiovascular:     Rate and Rhythm: Normal rate and regular rhythm.     Pulses: Normal pulses.     Heart sounds: Normal heart sounds.  Pulmonary:     Breath sounds: Normal breath sounds.  Abdominal:     General: There is no distension.     Palpations: Abdomen is soft.     Tenderness: There is no abdominal tenderness. There is no guarding.  Musculoskeletal:     Cervical back: Neck supple.     Comments: 2 cm eroded ulcer to the lateral right lower leg.  This is about 10 cm appeared to the lateral malleolus.  Is ulcerated to the fatty tissues.  Granulation tissue around the margins.  No surrounding cellulitis or significant general edema of the lower leg.  Dorsalis pedis pulses 2+ and symmetric  Skin:    General: Skin is warm and dry.  Neurological:     General: No focal deficit present.     Mental Status: He is oriented to person, place, and time.     Coordination: Coordination normal.  Psychiatric:        Mood and Affect: Mood normal.     ED Results / Procedures / Treatments   Labs (all labs ordered are listed, but only abnormal results are displayed) Labs Reviewed  BASIC METABOLIC PANEL - Abnormal; Notable for the following components:      Result Value   Sodium 133 (*)    CO2 21 (*)    Glucose, Bld 191 (*)    BUN 61 (*)    Creatinine, Ser 7.85 (*)    GFR calc non Af Amer 7 (*)    GFR calc  Af Amer 8 (*)    All other  components within normal limits  CBC - Abnormal; Notable for the following components:   RBC 4.03 (*)    Hemoglobin 11.4 (*)    HCT 35.5 (*)    All other components within normal limits  URINALYSIS, ROUTINE W REFLEX MICROSCOPIC - Abnormal; Notable for the following components:   Glucose, UA >=500 (*)    Protein, ur >=300 (*)    Bacteria, UA RARE (*)    All other components within normal limits  BRAIN NATRIURETIC PEPTIDE - Abnormal; Notable for the following components:   B Natriuretic Peptide 100.2 (*)    All other components within normal limits  CBG MONITORING, ED - Abnormal; Notable for the following components:   Glucose-Capillary 204 (*)    All other components within normal limits  TROPONIN I (HIGH SENSITIVITY) - Abnormal; Notable for the following components:   Troponin I (High Sensitivity) 47 (*)    All other components within normal limits  SARS CORONAVIRUS 2 BY RT PCR (HOSPITAL ORDER, [facility])  RAPID URINE DRUG SCREEN, HOSP PERFORMED  TROPONIN I (HIGH SENSITIVITY)    EKG EKG Interpretation  Date/Time:  Sunday October 25 2019 14:02:25 EDT Ventricular Rate:  86 PR Interval:  186 QRS Duration: 88 QT Interval:  390 QTC Calculation: 466 R Axis:   -21 Text Interpretation: Normal sinus rhythm Normal ECG [place] less acute than previous, otherwise unchanged Confirmed by Charlesetta Shanks [phone]) on 10/25/2019 4:39:31 PM   Radiology DG Tibia/Fibula Right  Result Date: 10/25/2019 CLINICAL DATA:  Chronic wound to the right distal tib fib. EXAM: RIGHT TIBIA AND FIBULA - 2 VIEW COMPARISON:  07/30/2019 FINDINGS: There is a chronic appearing defect involving the distal aspect of the tibia. There is nonspecific soft tissue swelling about the lower extremity. No radiopaque foreign body. No radiographic evidence for osteomyelitis. IMPRESSION: Soft tissue swelling about the lower extremity without evidence for an acute displaced fracture or dislocation. No  radiographic evidence for osteomyelitis. Electronically Signed   By: Constance Holster M.D.   On: 10/25/2019 19:32   CT Head Wo Contrast  Result Date: 10/25/2019 CLINICAL DATA:  Weakness for 2 weeks, high blood pressure the EXAM: CT HEAD WITHOUT CONTRAST TECHNIQUE: Contiguous axial images were obtained from the base of the skull through the vertex without intravenous contrast. COMPARISON:  CT head 04/04/2017 FINDINGS: Brain: No evidence of acute infarction, hemorrhage, hydrocephalus, extra-axial collection or mass lesion/mass effect. Symmetric prominence of the ventricles, cisterns and sulci compatible with mild senescent parenchymal volume loss. Patchy areas of white matter hypoattenuation are most compatible with chronic microvascular angiopathy. Vascular: Atherosclerotic calcification of the carotid siphons. No hyperdense vessel. Skull: No calvarial fracture or suspicious osseous lesion. No scalp swelling or hematoma. Sinuses/Orbits: Minimal thickening in the maxillary and ethmoid sinuses. Right concha bullosa. Right mastoid and middle ear effusion is noted. Left mastoid air cells are well aerated. Pneumatization of the petrous apices including opacified right petrous apex. Included orbital structures are unremarkable. Other: None IMPRESSION: 1. No acute intracranial abnormality. 2. Mild senescent parenchymal volume loss and chronic microvascular angiopathy. 3. Right mastoid and middle ear effusion including opacification of the pneumatized right petrous apex. Correlate for clinical features of otomastoiditis. Electronically Signed   By: Lovena Le M.D.   On: 10/25/2019 19:21   DG Chest Port 1 View  Result Date: 10/25/2019 CLINICAL DATA:  Hypertension. EXAM: PORTABLE CHEST 1 VIEW COMPARISON:  07/27/2017 FINDINGS: The heart size is  mildly enlarged. The lung volumes are low. There is atelectasis at the lung bases. There is no pneumothorax or large pleural effusion. There is no evidence for an acute  osseous abnormality. IMPRESSION: No active disease. Electronically Signed   By: Constance Holster M.D.   On: 10/25/2019 20:14    Procedures Procedures (including critical care time) CRITICAL CARE Performed by: Charlesetta Shanks   Total critical care time: 45 minutes  Critical care time was exclusive of separately billable procedures and treating other patients.  Critical care was necessary to treat or prevent imminent or life-threatening deterioration.  Critical care was time spent personally by me on the following activities: development of treatment plan with patient and/or surrogate as well as nursing, discussions with consultants, evaluation of patient's response to treatment, examination of patient, obtaining history from patient or surrogate, ordering and performing treatments and interventions, ordering and review of laboratory studies, ordering and review of radiographic studies, pulse oximetry and re-evaluation of patient's condition. Medications Ordered in ED Medications  sodium chloride flush (NS) 0.9 % injection 3 mL (3 mLs Intravenous Not Given 10/25/19 1716)  labetalol (NORMODYNE) injection 20 mg (has no administration in time range)  cefTRIAXone (ROCEPHIN) 1 g in sodium chloride 0.9 % 100 mL IVPB (1 g Intravenous New Bag/Given 10/25/19 2135)  HYDROmorphone (DILAUDID) injection 1 mg (has no administration in time range)  HYDROmorphone (DILAUDID) injection 1 mg (1 mg Intravenous Given 10/25/19 2011)  labetalol (NORMODYNE) tablet 200 mg (200 mg Oral Given 10/25/19 2024)  labetalol (NORMODYNE) injection 20 mg (20 mg Intravenous Given 10/25/19 2015)  hydrALAZINE (APRESOLINE) injection 20 mg (20 mg Intravenous Given 10/25/19 2129)    ED Course  I have reviewed the triage vital signs and the nursing notes.  Pertinent labs & imaging results that were available during my care of the patient were reviewed by me and considered in my medical decision making (see chart for  details).  Clinical Course as of Oct 25 2147  Sun Oct 25, 2019  2123 Consult: Reviewed with Dr. Haynes Hoehn [facility] hospitalist for admission   [MP]    Clinical Course User Index [MP] Charlesetta Shanks, MD   MDM Rules/Calculators/A&P                         Patient presents with several active problems.  Uncontrolled blood pressure despite taking home medications.  No headache or active neurologic dysfunction.  No blurred vision.  Patient does complain of dizziness and perceived water movement in his ear.  CT scan shows middle ear effusion and mastoid fluid.  On physical exam there is no erythema or pronounced enlargement of the mastoid.  Pinna normal in appearance.  TMs are not grossly bulging or erythematous.  With this finding and patient symptoms however will initiate Rocephin.  Pressure treated with labetalol 20 mg IV.  2 doses administered.  Patient is wearing his clonidine patch.  His p.m. labetalol dose of 20 mg provided as well.  Patient continues to be hypertensive.  He has had improvement from diastolic of 453 down to 646.  No signs of acute CHF.  Hypertensive urgency will administer hydralazine and plan for admission. Final Clinical Impression(s) / ED Diagnoses Final diagnoses:  Hypertensive urgency  Mastoiditis of right side  Ulcer of right lower extremity with fat layer exposed [facility])    Rx / [place] Orders ED Discharge Orders    None       Charlesetta Shanks, MD 10/25/19 2152

## 2019-10-26 ENCOUNTER — Encounter (HOSPITAL_COMMUNITY): Payer: Self-pay | Admitting: Family Medicine

## 2019-10-26 DIAGNOSIS — I160 Hypertensive urgency: Secondary | ICD-10-CM

## 2019-10-26 LAB — BASIC METABOLIC PANEL
Anion gap: 12 (ref 5–15)
BUN: 60 mg/dL — ABNORMAL HIGH (ref 6–20)
CO2: 21 mmol/L — ABNORMAL LOW (ref 22–32)
Calcium: 9.2 mg/dL (ref 8.9–10.3)
Chloride: 104 mmol/L (ref 98–111)
Creatinine, Ser: 7.69 mg/dL — ABNORMAL HIGH (ref 0.61–1.24)
GFR calc Af Amer: 8 mL/min — ABNORMAL LOW (ref >60)
GFR calc non Af Amer: 7 mL/min — ABNORMAL LOW (ref >60)
Glucose, Bld: 207 mg/dL — ABNORMAL HIGH (ref 70–99)
Potassium: 3.8 mmol/L (ref 3.5–5.1)
Sodium: 137 mmol/L (ref 135–145)

## 2019-10-26 LAB — GLUCOSE, CAPILLARY
Glucose-Capillary: 156 mg/dL — ABNORMAL HIGH (ref 70–99)
Glucose-Capillary: 168 mg/dL — ABNORMAL HIGH (ref 70–99)

## 2019-10-26 LAB — CBC
HCT: 34.1 % — ABNORMAL LOW (ref 39.0–52.0)
Hemoglobin: 10.9 g/dL — ABNORMAL LOW (ref 13.0–17.0)
MCH: 28.4 pg (ref 26.0–34.0)
MCHC: 32 g/dL (ref 30.0–36.0)
MCV: 88.8 fL (ref 80.0–100.0)
Platelets: 328 10*3/uL (ref 150–400)
RBC: 3.84 MIL/uL — ABNORMAL LOW (ref 4.22–5.81)
RDW: 13.1 % (ref 11.5–15.5)
WBC: 8.6 10*3/uL (ref 4.0–10.5)
nRBC: 0 % (ref 0.0–0.2)

## 2019-10-26 LAB — CBG MONITORING, ED
Glucose-Capillary: 192 mg/dL — ABNORMAL HIGH (ref 70–99)
Glucose-Capillary: 180 mg/dL — ABNORMAL HIGH (ref 70–99)

## 2019-10-26 LAB — HIV ANTIBODY (ROUTINE TESTING W REFLEX): HIV Screen 4th Generation wRfx: NONREACTIVE

## 2019-10-26 LAB — MAGNESIUM: Magnesium: 2.3 mg/dL (ref 1.7–2.4)

## 2019-10-26 LAB — HEMOGLOBIN A1C
Hgb A1c MFr Bld: 8.3 % — ABNORMAL HIGH (ref 4.8–5.6)
Mean Plasma Glucose: 191.51 mg/dL

## 2019-10-26 LAB — PHOSPHORUS: Phosphorus: 4.8 mg/dL — ABNORMAL HIGH (ref 2.5–4.6)

## 2019-10-26 MED ORDER — HYDRALAZINE HCL 50 MG PO TABS
100.0000 mg | ORAL_TABLET | Freq: Three times a day (TID) | ORAL | Status: DC
  Administered 2019-10-26 – 2019-10-27 (×4): 100 mg via ORAL
  Filled 2019-10-26 (×2): qty 2
  Filled 2019-10-26: qty 4
  Filled 2019-10-26: qty 2

## 2019-10-26 MED ORDER — ALBUTEROL SULFATE (2.5 MG/3ML) 0.083% IN NEBU: 2.5000 mg | INHALATION_SOLUTION | Freq: Four times a day (QID) | RESPIRATORY_TRACT | Status: DC | PRN

## 2019-10-26 MED ORDER — AMOXICILLIN-POT CLAVULANATE 250-125 MG PO TABS: 1.0000 | ORAL_TABLET | Freq: Two times a day (BID) | ORAL | 0 refills | Status: DC

## 2019-10-26 MED ORDER — HYDRALAZINE HCL 100 MG PO TABS: 100.0000 mg | ORAL_TABLET | Freq: Four times a day (QID) | ORAL | 4 refills | Status: DC

## 2019-10-26 MED ORDER — GABAPENTIN 300 MG PO CAPS
300.0000 mg | ORAL_CAPSULE | Freq: Every day | ORAL | Status: DC
  Administered 2019-10-27: 300 mg via ORAL
  Filled 2019-10-26: qty 1

## 2019-10-26 NOTE — Progress Notes (Signed)
54 year old male admitted after midnight with hypertensive urgency.  Patient has history of stage V CKD not on dialysis, type 2 diabetes, malignant hypertension and paroxysmal atrial fibrillation on Eliquis and chronic right leg ulcer for the last 3 months followed at the wound clinic came in with uncontrolled hypertension.  Patient reports he has been adherent to all his antihypertensives.  Upon arrival to the ER his blood pressure was 226/133.  CT head showed right mastoid and right middle ear effusions.  Chest x-ray is negative for active disease.  Chemistry was notable for creatinine of 7.85 on admission.  His creatinine last month was 7.14.  Urine drug screen was negative.  Patient reports ongoing right foot pain he says tramadol and gabapentin just makes him sleepy does not take the pain away.  His home medications for blood pressure include Norvasc 10 mg daily, Catapres patch 0.2 mg per 24 hours, Lasix 120 mg daily, labetalol 200 mg 2 times a day, hydralazine 25 mg 3 times a day.  His current blood pressure management includes-Norvasc 10 mg daily, Catapres patch 0.2 mg, Lasix 120 mg daily, hydralazine 100 mg 3 times a day which is increased from his home dose of 25 mg 3 times a day, labetalol 200 mg 2 times a day.  His blood pressure still remains at 181/112 heart rate between 85-1 02 respiratory rate 18-29.  Chest x-ray with no active disease.  X-ray of the right lower extremity shows soft tissue swelling without evidence of fracture or dislocation.  I will obtain renal artery Doppler to rule out renal artery stenosis and urine metanephrines. He has received multiple doses of IV labetalol and hydralazine.

## 2019-10-26 NOTE — Progress Notes (Signed)
Inpatient Diabetes Program Recommendations  AACE/ADA: New Consensus Statement on Inpatient Glycemic Control (2015)  Target Ranges:  Prepandial:   less than 140 mg/dL      Peak postprandial:   less than 180 mg/dL (1-2 hours)      Critically ill patients:  140 - 180 mg/dL   Lab Results  Component Value Date   GLUCAP 180 (H) 10/26/2019   HGBA1C 8.3 (H) 10/26/2019    Review of Glycemic Control Results for [name], [name] (MRN [id number]) as of 10/26/2019 14:14  Ref. Range 10/25/2019 16:41 10/26/2019 07:53 10/26/2019 12:52  Glucose-Capillary Latest Ref Range: 70 - 99 mg/dL 204 (H) 192 (H) 180 (H)   Diabetes history: DM 2 Outpatient Diabetes medications: 70/30 35 units bid Current orders for Inpatient glycemic control:  Lantus 7 units Novolog 0-6 units tid  A1c 8.3% on 7/19  Inpatient Diabetes Program Recommendations:    -Increase Lantus to 9-10 units.  Thanks,  Tama Headings RN, MSN, BC-ADM Inpatient Diabetes Coordinator Team Pager [phone] (8a-5p)

## 2019-10-26 NOTE — Evaluation (Signed)
Physical Therapy Evaluation - One time  Patient Details Name: [name] MRN: [id number] DOB: 05-02-1965 Today's Date: 10/26/2019   History of Present Illness  54 yo male admitted to ED on 7/18 with 2 weeks of weakness and significant HTN. CT head reveals R middle ear and mastoid effusion. PMH includes chronic R lower leg wound, DM, CKD V not on dialysis, CPAP use, adjustment disorder, panic disorder, history of empyema with chest tube and R lobectomy.  Clinical Impression   Pt presents with [facility] strength, ROM, balance, and activity tolerance on PT evaluation. Pt's main complaint is HTN, but pt not having any headaches as when admitted, and R chronic wound pain. PT educated pt on the importance of daily foot checks to check for ulceration/sores, pt states he does daily foot checks and wears properly-fitting shoes. No mobility issues at this time, PT to sign off.     Follow Up Recommendations No PT follow up    Equipment Recommendations  None recommended by PT    Recommendations for Other Services       Precautions / Restrictions Precautions Precautions: Fall Precaution Comments: R lateral lower leg chronic wound, presently uncovered Restrictions Weight Bearing Restrictions: No      Mobility  Bed Mobility Overal bed mobility: Needs Assistance Bed Mobility: Supine to Sit;Sit to Supine     Supine to sit: Modified independent (Device/Increase time) Sit to supine: Modified independent (Device/Increase time)   General bed mobility comments: increased time and effort  Transfers Overall transfer level: Independent               General transfer comment: no physical assist  Ambulation/Gait Ambulation/Gait assistance: Modified independent (Device/Increase time) Gait Distance (Feet): 200 Feet Assistive device: None Gait Pattern/deviations: Step-through pattern;Decreased stride length Gait velocity: slightly decr   General Gait Details: mod I for increased time, no  antalgic gait appreciated even with RLE wound.  Stairs            Wheelchair Mobility    Modified Rankin (Stroke Patients Only)       Balance Overall balance assessment: Modified Independent   Sitting balance-Leahy Scale: Normal       Standing balance-Leahy Scale: Good Standing balance comment: tolerates directional changes, horizontal/vertical head turns. No evidence of unsteadiness                             Pertinent Vitals/Pain Pain Assessment: 0-10 Pain Score: 6  Pain Location: R lower leg and foot Pain Descriptors / Indicators: Sore Pain Intervention(s): Limited activity within patient's tolerance;Monitored during session;Repositioned;Premedicated before session    Home Living Family/patient expects to be discharged to:: Private residence Living Arrangements: Spouse/significant other;Children (daughter) Available Help at Discharge: Family;Available PRN/intermittently Type of Home: House Home Access: Level entry     Home Layout: One level Home Equipment: Cane - single point      Prior Function Level of Independence: Independent         Comments: does not work, wife does. Pt drives, likes to work on home projects     Hand Dominance   Dominant Hand: Right    Extremity/Trunk Assessment   Upper Extremity Assessment Upper Extremity Assessment: Overall WFL for tasks assessed    Lower Extremity Assessment Lower Extremity Assessment: Overall WFL for tasks assessed    Cervical / Trunk Assessment Cervical / Trunk Assessment: Normal  Communication   Communication: No difficulties  Cognition Arousal/Alertness: Awake/alert Behavior During Therapy: WFL for  tasks assessed/performed Overall Cognitive Status: Within Functional Limits for tasks assessed                                        General Comments General comments (skin integrity, edema, etc.): BP at rest: 161/96, BP sitting EOB: 169/105; HR 90s-100s during  mobility    Exercises     Assessment/Plan    PT Assessment Patent does not need any further PT services  PT Problem List         PT Treatment Interventions      PT Goals (Current goals can be found in the Care Plan section)  Acute Rehab PT Goals Patient Stated Goal: go home PT Goal Formulation: With patient Time For Goal Achievement: 10/26/19 Potential to Achieve Goals: Good    Frequency     Barriers to discharge        Co-evaluation               AM-PAC PT "6 Clicks" Mobility  Outcome Measure Help needed turning from your back to your side while in a flat bed without using bedrails?: None Help needed moving from lying on your back to sitting on the side of a flat bed without using bedrails?: None Help needed moving to and from a bed to a chair (including a wheelchair)?: None Help needed standing up from a chair using your arms (e.g., wheelchair or bedside chair)?: None Help needed to walk in hospital room?: None Help needed climbing 3-5 steps with a railing? : None 6 Click Score: 24    End of Session   Activity Tolerance: Patient tolerated treatment well Patient left: in bed;with call bell/phone within reach Nurse Communication: Mobility status PT Visit Diagnosis: Other abnormalities of gait and mobility (R26.89)    Time: 1856-3149 PT Time Calculation (min) (ACUTE ONLY): 20 min   Charges:   PT Evaluation $PT Eval Low Complexity: 1 Low          Adia Crammer E, PT Acute Rehabilitation Services Pager [phone]  Office [phone]   Jakwon Gayton D Elonda Husky 10/26/2019, 4:19 PM

## 2019-10-26 NOTE — ED Notes (Signed)
Lunch Trays Ordered @ 1101. 

## 2019-10-26 NOTE — ED Notes (Signed)
SDU Breakfast Ordered 

## 2019-10-26 NOTE — Consult Note (Signed)
WOC Nurse Consult Note: Reason for Consult: right lateral LE ulceration Wound type: suspected venous insufficiency Pressure Injury POA: N/A Measurement: 2.5cm x 2.6cm x 0.2cm Wound bed:100% red, moist Drainage (amount, consistency, odor) small amount of serosanguinous exudate on old dressing Periwound: intact with evidence of previous wound healing, i.e. contraction at wound periphery Dressing procedure/placement/frequency: I have provided Nursing with guidance for the topical care of this wound using twice daily cleansing, xeroform gauze and securement with a roll gauze/paper tape.  Patient would benefit from follow up at an outpatient wound center of his choosing for monitoring of the wound and for the provision of compression therapy. Prior to the initiation of compression, ABIs should be assessed.  [facility] nursing team will not follow, but will remain available to this patient, the nursing and medical teams.  Please re-consult if needed. Thanks, Maudie Flakes, MSN, RN, [place], Arther Abbott  Pager# [phone]

## 2019-10-27 ENCOUNTER — Observation Stay (HOSPITAL_BASED_OUTPATIENT_CLINIC_OR_DEPARTMENT_OTHER): Payer: Medicare Other

## 2019-10-27 DIAGNOSIS — I160 Hypertensive urgency: Secondary | ICD-10-CM

## 2019-10-27 LAB — CBC
HCT: 34.1 % — ABNORMAL LOW (ref 39.0–52.0)
Hemoglobin: 10.9 g/dL — ABNORMAL LOW (ref 13.0–17.0)
MCH: 28.9 pg (ref 26.0–34.0)
MCHC: 32 g/dL (ref 30.0–36.0)
MCV: 90.5 fL (ref 80.0–100.0)
Platelets: 330 10*3/uL (ref 150–400)
RBC: 3.77 MIL/uL — ABNORMAL LOW (ref 4.22–5.81)
RDW: 13.4 % (ref 11.5–15.5)
WBC: 7.8 10*3/uL (ref 4.0–10.5)
nRBC: 0 % (ref 0.0–0.2)

## 2019-10-27 LAB — BASIC METABOLIC PANEL
Anion gap: 10 (ref 5–15)
BUN: 64 mg/dL — ABNORMAL HIGH (ref 6–20)
CO2: 20 mmol/L — ABNORMAL LOW (ref 22–32)
Calcium: 9 mg/dL (ref 8.9–10.3)
Chloride: 106 mmol/L (ref 98–111)
Creatinine, Ser: 8.23 mg/dL — ABNORMAL HIGH (ref 0.61–1.24)
GFR calc Af Amer: 8 mL/min — ABNORMAL LOW (ref >60)
GFR calc non Af Amer: 7 mL/min — ABNORMAL LOW (ref >60)
Glucose, Bld: 179 mg/dL — ABNORMAL HIGH (ref 70–99)
Potassium: 3.8 mmol/L (ref 3.5–5.1)
Sodium: 136 mmol/L (ref 135–145)

## 2019-10-27 LAB — GLUCOSE, CAPILLARY: Glucose-Capillary: 152 mg/dL — ABNORMAL HIGH (ref 70–99)

## 2019-10-27 MED ORDER — GABAPENTIN 300 MG PO CAPS: 300.0000 mg | ORAL_CAPSULE | Freq: Every day | ORAL | 0 refills | Status: DC

## 2019-10-27 NOTE — Discharge Summary (Signed)
Physician Discharge Summary  [name] MOQ:[id number] DOB: 07-Mar-1966 DOA: 10/25/2019  PCP: Bartholome Bill, MD  Admit date: 10/25/2019 Discharge date: 10/27/2019  Admitted From: Home Disposition: Home Recommendations for Outpatient Follow-up:  1. Follow up with PCP in 1-2 weeks 2. Please obtain BMP/CBC in one week 3. follow-up with wound clinic 4. follow-up with nephrologist Home Health none Equipment/Devices: None Discharge Condition: Stable and improved CODE STATUS: Full code Diet recommendation: Cardiac renal diet Brief/Interim Summary: Male with history of uncontrolled hypertension stage V CKD type 2 diabetes paroxysmal A. fib on Eliquis chronic right lower extremity ulcer admitted with hypertensive urgency. He reports taking his medications as prescribed. He denies any noncompliance to diet.  Discharge Diagnoses:  Principal Problem:   Hypertensive urgency Active Problems:   CKD (chronic kidney disease), stage V (HCC)   Essential hypertension, malignant   Type 2 diabetes mellitus with hyperglycemia, with long-term current use of insulin (HCC)   DM (diabetes mellitus) type II controlled, neurological manifestation (HCC)   Chronic ulcer of right leg (HCC)   Right otitis media with effusion   AF (paroxysmal atrial fibrillation) ([place])  #1 uncontrolled hypertension/hypertensive urgency-his home medications were continued which includes Norvasc 10 mg daily Catapres patch 0.2 mg/week Lasix 120 mg daily labetalol 200 mg daily and increase hydralazine 200 mg 3 times a day. I have asked him to continue his new regimen and follow-up with his nephrologist. A Doppler of the renal arteries in 2020 was negative for renal artery stenosis. Urine metanephrines were ordered but it was not collected. He was seen by physical therapy did not recommend any outpatient treatments.  #2 paroxysmal atrial fibrillation continue Eliquis  #3 type 2 diabetes continue insulin  #4 right lower  extremity ulcer he is followed at wound clinic continue. He was seen by wound care nurse during this hospital stay.  #5 right middle ear and mastoid effusion finish the course of Augmentin.  #6 stage V CKD follow-up with nephrologist.  Estimated body mass index is 38.17 kg/m as calculated from the following:   Height as of this encounter: 5\' 7"  (1.702 m).   Weight as of this encounter: 110.5 kg.  Discharge Instructions  Discharge Instructions    Diet - low sodium heart healthy   Complete by: As directed    Discharge wound care:   Complete by: As directed    Increase activity slowly   Complete by: As directed      Allergies as of 10/27/2019   No Known Allergies     Medication List    TAKE these medications   acetaminophen 500 MG tablet Commonly known as: TYLENOL Take 1,000-1,500 mg by mouth 2 (two) times daily as needed (pain).   albuterol 108 (90 Base) MCG/ACT inhaler Commonly known as: VENTOLIN HFA Inhale 2 puffs into the lungs every 6 (six) hours as needed for wheezing or shortness of breath.   amLODipine 10 MG tablet Commonly known as: NORVASC Take 1 tablet (10 mg total) by mouth daily.   amoxicillin-clavulanate 250-125 MG tablet Commonly known as: AUGMENTIN Take 1 tablet by mouth 2 (two) times daily.   cloNIDine 0.2 mg/24hr patch Commonly known as: CATAPRES - Dosed in mg/24 hr Place 1 patch (0.2 mg total) onto the skin once a week. What changed: when to take this   Eliquis 5 MG Tabs tablet Generic drug: apixaban Take 5 mg by mouth 2 (two) times daily.   fluticasone 50 MCG/ACT nasal spray Commonly known as: FLONASE Place 1 spray into  both nostrils 2 (two) times daily.   furosemide 80 MG tablet Commonly known as: LASIX Take 120 mg by mouth daily.   gabapentin 300 MG capsule Commonly known as: NEURONTIN Take 1 capsule (300 mg total) by mouth daily. Start taking on: October 28, 2019 What changed: when to take this   hydrALAZINE 100 MG tablet Commonly  known as: APRESOLINE Take 1 tablet (100 mg total) by mouth 4 (four) times daily. What changed:   medication strength  how much to take  when to take this   insulin NPH-regular Human (70-30) 100 UNIT/ML injection Inject 35 Units into the skin 2 (two) times daily before a meal.   labetalol 200 MG tablet Commonly known as: NORMODYNE Take 200 mg by mouth 2 (two) times daily.   levocetirizine 5 MG tablet Commonly known as: XYZAL Take 5 mg by mouth at bedtime.   multivitamin with minerals Tabs tablet Take 1 tablet by mouth daily. Centrum   potassium chloride SA 20 MEQ tablet Commonly known as: KLOR-CON Take 20 mEq by mouth every morning. What changed: Another medication with the same name was removed. Continue taking this medication, and follow the directions you see here.   tiZANidine 4 MG tablet Commonly known as: ZANAFLEX Take 4 mg by mouth every 8 (eight) hours as needed for muscle spasms.   traMADol 50 MG tablet Commonly known as: ULTRAM Take 50 mg by mouth 3 (three) times daily as needed (pain).   Vitamin D (Ergocalciferol) 1.25 MG (50000 UNIT) Caps capsule Commonly known as: DRISDOL Take 50,000 Units by mouth every Sunday.            Discharge Care Instructions  (From admission, onward)         Start     Ordered   10/27/19 0000  Discharge wound care:        07 /20/21 0854          Follow-up Information    Bartholome Bill, MD Follow up.   Specialty: Family Medicine Contact information: [place] [address] [phone]              No Known Allergies  Consultations:  Wound care consult   Procedures/Studies: DG Tibia/Fibula Right  Result Date: 10/25/2019 CLINICAL DATA:  Chronic wound to the right distal tib fib. EXAM: RIGHT TIBIA AND FIBULA - 2 VIEW COMPARISON:  07/30/2019 FINDINGS: There is a chronic appearing defect involving the distal aspect of the tibia. There is nonspecific soft tissue  swelling about the lower extremity. No radiopaque foreign body. No radiographic evidence for osteomyelitis. IMPRESSION: Soft tissue swelling about the lower extremity without evidence for an acute displaced fracture or dislocation. No radiographic evidence for osteomyelitis. Electronically Signed   By: Constance Holster M.D.   On: 10/25/2019 19:32   CT Head Wo Contrast  Result Date: 10/25/2019 CLINICAL DATA:  Weakness for 2 weeks, high blood pressure the EXAM: CT HEAD WITHOUT CONTRAST TECHNIQUE: Contiguous axial images were obtained from the base of the skull through the vertex without intravenous contrast. COMPARISON:  CT head 04/04/2017 FINDINGS: Brain: No evidence of acute infarction, hemorrhage, hydrocephalus, extra-axial collection or mass lesion/mass effect. Symmetric prominence of the ventricles, cisterns and sulci compatible with mild senescent parenchymal volume loss. Patchy areas of white matter hypoattenuation are most compatible with chronic microvascular angiopathy. Vascular: Atherosclerotic calcification of the carotid siphons. No hyperdense vessel. Skull: No calvarial fracture or suspicious osseous lesion. No scalp swelling or hematoma. Sinuses/Orbits: Minimal  thickening in the maxillary and ethmoid sinuses. Right concha bullosa. Right mastoid and middle ear effusion is noted. Left mastoid air cells are well aerated. Pneumatization of the petrous apices including opacified right petrous apex. Included orbital structures are unremarkable. Other: None IMPRESSION: 1. No acute intracranial abnormality. 2. Mild senescent parenchymal volume loss and chronic microvascular angiopathy. 3. Right mastoid and middle ear effusion including opacification of the pneumatized right petrous apex. Correlate for clinical features of otomastoiditis. Electronically Signed   By: Lovena Le M.D.   On: 10/25/2019 19:21   DG Chest Port 1 View  Result Date: 10/25/2019 CLINICAL DATA:  Hypertension. EXAM: PORTABLE  CHEST 1 VIEW COMPARISON:  07/27/2017 FINDINGS: The heart size is mildly enlarged. The lung volumes are low. There is atelectasis at the lung bases. There is no pneumothorax or large pleural effusion. There is no evidence for an acute osseous abnormality. IMPRESSION: No active disease. Electronically Signed   By: Constance Holster M.D.   On: 10/25/2019 20:14    (Echo, Carotid, EGD, Colonoscopy, ERCP)    Subjective: Resting in bed no complaints no nausea vomiting no chest pain shortness of breath.  Discharge Exam: Vitals:   10/27/19 0518 10/27/19 0743  BP: (!) 175/110 (!) 169/106  Pulse: 91 90  Resp: 15 18  Temp: 98.2 F (36.8 C) 98 F (36.7 C)  SpO2: 100% 98%   Vitals:   10/26/19 2247 10/27/19 0139 10/27/19 0518 10/27/19 0743  BP: (!) 161/104 (!) 170/104 (!) 175/110 (!) 169/106  Pulse:  93 91 90  Resp:  16 15 18   Temp:   98.2 F (36.8 C) 98 F (36.7 C)  TempSrc:  Oral Oral Oral  SpO2:  100% 100% 98%  Weight:   110.5 kg   Height:        General: Pt is alert, awake, not in acute distress Cardiovascular: RRR, S1/S2 +, no rubs, no gallops Respiratory: CTA bilaterally, no wheezing, no rhonchi Abdominal: Soft, NT, ND, bowel sounds + Extremities: no edema, no cyanosis    The results of significant diagnostics from this hospitalization (including imaging, microbiology, ancillary and laboratory) are listed below for reference.     Microbiology: Recent Results (from the past 240 hour(s))  SARS Coronavirus 2 by RT PCR (hospital order, performed in [facility] hospital lab) Nasopharyngeal Nasopharyngeal Swab     Status: None   Collection Time: 10/25/19  9:02 PM   Specimen: Nasopharyngeal Swab  Result Value Ref Range Status   SARS Coronavirus 2 NEGATIVE NEGATIVE Final    Comment: (NOTE) SARS-CoV-2 target nucleic acids are NOT DETECTED.  The SARS-CoV-2 RNA is generally detectable in upper and lower respiratory specimens during the acute phase of infection. The  lowest concentration of SARS-CoV-2 viral copies this assay can detect is 250 copies / mL. A negative result does not preclude SARS-CoV-2 infection and should not be used as the sole basis for treatment or other patient management decisions.  A negative result may occur with improper specimen collection / handling, submission of specimen other than nasopharyngeal swab, presence of viral mutation(s) within the areas targeted by this assay, and inadequate number of viral copies (<250 copies / mL). A negative result must be combined with clinical observations, patient history, and epidemiological information.  Fact Sheet for Patients:   StrictlyIdeas.no  Fact Sheet for Healthcare Providers: BankingDealers.co.za  This test is not yet approved or  cleared by the [place] FDA and has been authorized for detection and/or diagnosis of SARS-CoV-2 by FDA under  an Emergency Use Authorization (EUA).  This EUA will remain in effect (meaning this test can be used) for the duration of the COVID-19 declaration under Section 564(b)(1) of the Act, 21 U.S.C. section 360bbb-3(b)(1), unless the authorization is terminated or revoked sooner.  Performed at [facility], [place] [address], [place], [place] [address]      Labs: BNP (last 3 results) Recent Labs    10/25/19 2014  BNP 761.6*   Basic Metabolic Panel: Recent Labs  Lab 10/25/19 1407 10/26/19 0309 10/26/19 0647 10/27/19 0508  NA 133*  --  137 136  K 3.8  --  3.8 3.8  CL 101  --  104 106  CO2 21*  --  21* 20*  GLUCOSE 191*  --  207* 179*  BUN 61*  --  60* 64*  CREATININE 7.85*  --  7.69* 8.23*  CALCIUM 8.9  --  9.2 9.0  MG  --  2.3  --   --   PHOS  --  4.8*  --   --    Liver Function Tests: No results for input(s): AST, ALT, ALKPHOS, BILITOT, PROT, ALBUMIN in the last 168 hours. No results for input(s): LIPASE, AMYLASE in the last 168 hours. No results for input(s):  AMMONIA in the last 168 hours. CBC: Recent Labs  Lab 10/25/19 1407 10/26/19 0309 10/27/19 0508  WBC 8.8 8.6 7.8  HGB 11.4* 10.9* 10.9*  HCT 35.5* 34.1* 34.1*  MCV 88.1 88.8 90.5  PLT 336 328 330   Cardiac Enzymes: No results for input(s): CKTOTAL, CKMB, CKMBINDEX, TROPONINI in the last 168 hours. BNP: Invalid input(s): POCBNP CBG: Recent Labs  Lab 10/26/19 0753 10/26/19 1252 10/26/19 1702 10/26/19 2138 10/27/19 0732  GLUCAP 192* 180* 156* 168* 152*   D-Dimer No results for input(s): DDIMER in the last 72 hours. Hgb A1c Recent Labs    10/26/19 0138  HGBA1C 8.3*   Lipid Profile No results for input(s): CHOL, HDL, LDLCALC, TRIG, CHOLHDL, LDLDIRECT in the last 72 hours. Thyroid function studies No results for input(s): TSH, T4TOTAL, T3FREE, THYROIDAB in the last 72 hours.  Invalid input(s): FREET3 Anemia work up No results for input(s): VITAMINB12, FOLATE, FERRITIN, TIBC, IRON, RETICCTPCT in the last 72 hours. Urinalysis    Component Value Date/Time   COLORURINE YELLOW 10/25/2019 2014   APPEARANCEUR CLEAR 10/25/2019 2014   LABSPEC 1.012 10/25/2019 2014   PHURINE 6.0 10/25/2019 2014   GLUCOSEU >=500 (A) 10/25/2019 2014   HGBUR NEGATIVE 10/25/2019 2014   [place] NEGATIVE 10/25/2019 2014   KETONESUR NEGATIVE 10/25/2019 2014   PROTEINUR >=300 (A) 10/25/2019 2014   UROBILINOGEN 0.2 03/26/2014 1626   NITRITE NEGATIVE 10/25/2019 2014   LEUKOCYTESUR NEGATIVE 10/25/2019 2014   Sepsis Labs Invalid input(s): PROCALCITONIN,  WBC,  LACTICIDVEN Microbiology Recent Results (from the past 240 hour(s))  SARS Coronavirus 2 by RT PCR (hospital order, performed in [place] hospital lab) Nasopharyngeal Nasopharyngeal Swab     Status: None   Collection Time: 10/25/19  9:02 PM   Specimen: Nasopharyngeal Swab  Result Value Ref Range Status   SARS Coronavirus 2 NEGATIVE NEGATIVE Final    Comment: (NOTE) SARS-CoV-2 target nucleic acids are NOT DETECTED.  The SARS-CoV-2  RNA is generally detectable in upper and lower respiratory specimens during the acute phase of infection. The lowest concentration of SARS-CoV-2 viral copies this assay can detect is 250 copies / mL. A negative result does not preclude SARS-CoV-2 infection and should not be used as the sole  basis for treatment or other patient management decisions.  A negative result may occur with improper specimen collection / handling, submission of specimen other than nasopharyngeal swab, presence of viral mutation(s) within the areas targeted by this assay, and inadequate number of viral copies (<250 copies / mL). A negative result must be combined with clinical observations, patient history, and epidemiological information.  Fact Sheet for Patients:   StrictlyIdeas.no  Fact Sheet for Healthcare Providers: BankingDealers.co.za  This test is not yet approved or  cleared by the [place] FDA and has been authorized for detection and/or diagnosis of SARS-CoV-2 by FDA under an Emergency Use Authorization (EUA).  This EUA will remain in effect (meaning this test can be used) for the duration of the COVID-19 declaration under Section 564(b)(1) of the Act, 21 U.S.C. section 360bbb-3(b)(1), unless the authorization is terminated or revoked sooner.  Performed at [facility], [place] [address] [place], [place] [address]      Time coordinating discharge:  9 SIGNED:   Georgette Shell, MD  [facility] 10/27/2019, 8:55 AM  If 7PM-7AM, please contact night-coverage [email or web address] Password [email or web address]

## 2019-10-27 NOTE — Progress Notes (Signed)
Renal artery duplex study completed.   See Cv Proc for preliminary results.   Darlin Coco

## 2019-10-27 NOTE — Care Management Obs Status (Signed)
[place] NOTIFICATION   Patient Details  Name: Chaney Oliva MRN: [id number] Date of Birth: 1965/09/23   Medicare Observation Status Notification Given:  Yes    Bethena Roys, RN 10/27/2019, 7:59 AM

## 2019-10-27 NOTE — Discharge Instructions (Signed)

## 2019-10-28 ENCOUNTER — Encounter (HOSPITAL_BASED_OUTPATIENT_CLINIC_OR_DEPARTMENT_OTHER): Payer: Medicare Other | Admitting: Physician Assistant

## 2019-11-12 ENCOUNTER — Telehealth (HOSPITAL_COMMUNITY): Payer: Self-pay

## 2019-11-12 NOTE — Telephone Encounter (Signed)
I received an order from Dr Jeri Cos, called patent several times with no response. After some investigation, Patient had ultrasound done 11/07/19 at [facility]. Order was shredded.   [facility]

## 2019-11-18 ENCOUNTER — Encounter (HOSPITAL_BASED_OUTPATIENT_CLINIC_OR_DEPARTMENT_OTHER): Payer: Medicare Other | Attending: Physician Assistant | Admitting: Physician Assistant

## 2019-11-18 DIAGNOSIS — E1151 Type 2 diabetes mellitus with diabetic peripheral angiopathy without gangrene: Secondary | ICD-10-CM | POA: Insufficient documentation

## 2019-11-18 DIAGNOSIS — E1142 Type 2 diabetes mellitus with diabetic polyneuropathy: Secondary | ICD-10-CM | POA: Diagnosis not present

## 2019-11-18 DIAGNOSIS — G4730 Sleep apnea, unspecified: Secondary | ICD-10-CM | POA: Insufficient documentation

## 2019-11-18 DIAGNOSIS — I5032 Chronic diastolic (congestive) heart failure: Secondary | ICD-10-CM | POA: Diagnosis not present

## 2019-11-18 DIAGNOSIS — I890 Lymphedema, not elsewhere classified: Secondary | ICD-10-CM | POA: Insufficient documentation

## 2019-11-18 DIAGNOSIS — E1122 Type 2 diabetes mellitus with diabetic chronic kidney disease: Secondary | ICD-10-CM | POA: Insufficient documentation

## 2019-11-18 DIAGNOSIS — I130 Hypertensive heart and chronic kidney disease with heart failure and stage 1 through stage 4 chronic kidney disease, or unspecified chronic kidney disease: Secondary | ICD-10-CM | POA: Diagnosis not present

## 2019-11-18 DIAGNOSIS — I4891 Unspecified atrial fibrillation: Secondary | ICD-10-CM | POA: Insufficient documentation

## 2019-11-18 DIAGNOSIS — L97812 Non-pressure chronic ulcer of other part of right lower leg with fat layer exposed: Secondary | ICD-10-CM | POA: Diagnosis not present

## 2019-11-18 DIAGNOSIS — E11622 Type 2 diabetes mellitus with other skin ulcer: Secondary | ICD-10-CM | POA: Insufficient documentation

## 2019-11-18 DIAGNOSIS — N184 Chronic kidney disease, stage 4 (severe): Secondary | ICD-10-CM | POA: Insufficient documentation

## 2019-11-18 DIAGNOSIS — I872 Venous insufficiency (chronic) (peripheral): Secondary | ICD-10-CM | POA: Insufficient documentation

## 2019-11-18 NOTE — Progress Notes (Addendum)
[place], MADIX ([id number]) Visit Report for 11/18/2019 Chief Complaint Document Details Patient Name: Date of Service: [name], [name] 11/18/2019 10:00 A M Medical Record Number: [id number] Patient Account Number: [id number] Date of Birth/Sex: Treating RN: [birth year] (54 y.o. [name] Primary Care Provider: Precious Haws Other Clinician: Referring Provider: Treating Provider/Extender: [name], [name] in Treatment: 15 Information Obtained from: Patient Chief Complaint 08/04/2019; patient is here for review of the wound on the right lateral lower leg Electronic Signature(s) Signed: 11/18/2019 10:16:53 AM By: Worthy Keeler PA-C Entered By: Worthy Keeler on 11/18/2019 10:16:52 -------------------------------------------------------------------------------- HPI Details Patient Name: Date of Service: [name], [name] 11/18/2019 10:00 A M Medical Record Number: [id number] Patient Account Number: [id number] Date of Birth/Sex: Treating RN: Oct 25, 1965 (54 y.o. [name] Primary Care Provider: Precious Haws Other Clinician: Referring Provider: Treating Provider/Extender: [name], [name] in Treatment: 15 History of Present Illness HPI Description: ADMISSION 08/04/2019 This is a 54 year old man who traumatized his right lateral lower leg while going [place] an escal[place] at the mall sometime in December 2020. He was referred to vascular surgery and on 04/14/2019 he saw Dr. Wenda Overland. He was felt to have "palpable pulses and normal noninvasive studies" although I cannot see these results. He was back in his primary doctor's office on 07/30/2019 for a nonhealing right lower extremity wound. He was referred to wound care I think in [facility] but his insurance was out of network. A CNS was ordered. X-ray was done that was negative. He has been using wound cleanser and a Band-Aid. Past medical history is actually quite extensive and includes chronic kidney  disease stage IV, chronic diastolic heart failure, atrial fibrillation, poorly controlled hypertension, poorly controlled diabetes with peripheral neuropathy and a recent hemoglobin A1c of 11.5, depression, lymphedema and sleep apnea ABI in our clinic was 1.1 in the right 5/6; this wound was initially traumatized while the patient was going down on an escalator. Arrived in clinic last week with a completely nonviable surface. He required mechanical debridement and we have been using Iodoflex under compression. Apparently the compression wraps fell down. He also has poorly controlled diabetes 5/15; traumatic/contusion wound to the right lateral lower leg. We have been using Iodoflex to clean at the surface of this which was completely nonviable along with mechanical debridements. He is under compression. 09/02/2019 upon evaluation today patient appears to be doing well with regard to his wound on his right lateral leg. He has been tolerating the dressing changes without complication. Fortunately there is no signs of active infection at this time. No fevers, chills, nausea, vomiting, or diarrhea. With that being said there is some necrotic tissue around the edges of the wound that is and require some sharp debridement today. That was discussed with the patient and his mother in the office today prior to debridement to which she did consent. 09/09/2019 upon evaluation today patient appears to be making progress in regard to his wound. Fortunately the wound is not nearly as deep as what it was. It is measuring a little bit larger due to having to clear some of the edges away but again that is necessary to get this to heal. Fortunately there is no evidence of active infection at this time. No fevers, chills, nausea, vomiting, or diarrhea. 09/16/2019 on evaluation today patient appears to be doing excellent in regard to his wound currently. In fact I do not see anything that we can need to debride  today which  is great news. The wound bed looks healthy and the edges of the wound look like they are doing great. Overall I feel like we are at a good place and headed in the proper direction. 09/23/2019 upon evaluation today patient appears to be doing okay in regard to his wound. This is not measuring any smaller it is about the same. With that being said he tells me that his wrap actually slid down he ended up having to take this off he was on a trip and did not have his Velcro compression with him. With that being said I do not see any signs of anything significantly worsening which is good news. 09/30/2019 upon evaluation today patient appears to be doing a little bit better in regard to his leg ulcer. He has been tolerating the dressing changes without complication. Fortunately there is no signs of active infection at this time. No fevers, chills, nausea, vomiting, or diarrhea. 10/14/2019 upon evaluation today patient's wound appears to be healthier. Overall but unfortunately is measuring a little bit bigger. With that being said I do think that we do need to still continue to wrap his leg. Fortunately there is no signs of active infection at this time which is great news. 11/18/2019 upon evaluation today patient actually appears to be doing decently well with regard to his wound. He has been tolerating the dressing changes without complication. Fortunately there is no signs of active infection has been using just Neosporin at this point since he was in the hospital. He did have a venous Doppler study in the hospital though I cannot see the results that was from November 04, 2019. Nonetheless overall I am pleased with the fact the patient does seem to be doing better. Electronic Signature(s) Signed: 11/18/2019 10:53:17 AM By: Worthy Keeler PA-C Entered By: Worthy Keeler on 11/18/2019 10:53:16 -------------------------------------------------------------------------------- Physical Exam Details Patient Name:  Date of Service: [name], [name] 11/18/2019 10:00 A M Medical Record Number: [id number] Patient Account Number: [id number] Date of Birth/Sex: Treating RN: 09/07/1965 (54 y.o. [name] Primary Care Provider: Precious Haws Other Clinician: Referring Provider: Treating Provider/Extender: [name], [name] in Treatment: 15 Constitutional Well-nourished and well-hydrated in no acute distress. Respiratory normal breathing without difficulty. Psychiatric this patient is able to make decisions and demonstrates good insight into disease process. Alert and Oriented x 3. pleasant and cooperative. Notes Upon inspection patient's wound bed actually showed signs of good granulation at this time there does not appear to be any evidence of active infection which is great news and overall I feel like he is making progress. I do think may be adding collagen to the base of the wound and letting him change this at home is a good option at this point. Electronic Signature(s) Signed: 11/18/2019 10:53:33 AM By: Worthy Keeler PA-C Entered By: Worthy Keeler on 11/18/2019 10:53:33 -------------------------------------------------------------------------------- Physician Orders Details Patient Name: Date of Service: [name], [name] 11/18/2019 10:00 A M Medical Record Number: [id number] Patient Account Number: [id number] Date of Birth/Sex: Treating RN: 1965-07-27 (54 y.o. [name] Primary Care Provider: Precious Haws Other Clinician: Referring Provider: Treating Provider/Extender: Katheran Awe Weeks in Treatment: 15 Verbal / Phone Orders: No Diagnosis Coding ICD-10 Coding Code Description S80.811D Abrasion, right lower leg, subsequent encounter L97.812 Non-pressure chronic ulcer of other part of right lower leg with fat layer exposed E11.622 Type 2 diabetes mellitus with other skin ulcer I89.0 Lymphedema, not  elsewhere classified I87.2 Venous insufficiency  (chronic) (peripheral) Follow-up Appointments Return Appointment in 1 week. Dressing Change Frequency Change Dressing every other day. Skin Barriers/Peri-Wound Care Moisturizing lotion - to leg daily Wound Cleansing Wound #1 Right,Lateral Lower Leg May shower and wash wound with soap and water. Primary Wound Dressing Wound #1 Right,Lateral Lower Leg Silver Collagen - moisten with hydrogel or KY gel Secondary Dressing Wound #1 Right,Lateral Lower Leg Dry Gauze - secured with tape or foam border Edema Control Wound #1 Right,Lateral Lower Leg Avoid standing for long periods of time Elevate legs to the level of the heart or above for 30 minutes daily and/or when sitting, a frequency of: - throughout the day. Exercise regularly Other: - ace wrap Electronic Signature(s) Signed: 11/18/2019 6:14:05 PM By: Baruch Gouty RN, BSN Signed: 11/19/2019 10:06:45 AM By: Worthy Keeler PA-C Entered By: Baruch Gouty on 11/18/2019 10:50:58 -------------------------------------------------------------------------------- Problem List Details Patient Name: Date of Service: [name], [name] 11/18/2019 10:00 A M Medical Record Number: [id number] Patient Account Number: [id number] Date of Birth/Sex: Treating RN: [birth year] (54 y.o. [name] Primary Care Provider: Precious Haws Other Clinician: Referring Provider: Treating Provider/Extender: Katheran Awe Weeks in Treatment: 15 Active Problems ICD-10 Encounter Code Description Active Date MDM Diagnosis S80.811D Abrasion, right lower leg, subsequent encounter 08/04/2019 No Yes L97.812 Non-pressure chronic ulcer of other part of right lower leg with fat layer 08/04/2019 No Yes exposed E11.622 Type 2 diabetes mellitus with other skin ulcer 08/04/2019 No Yes I89.0 Lymphedema, not elsewhere classified 08/04/2019 No Yes I87.2 Venous insufficiency (chronic) (peripheral) 09/30/2019 No Yes Inactive Problems Resolved  Problems Electronic Signature(s) Signed: 11/18/2019 10:16:45 AM By: Worthy Keeler PA-C Entered By: Worthy Keeler on 11/18/2019 10:16:45 -------------------------------------------------------------------------------- Progress Note Details Patient Name: Date of Service: [name], [name] 11/18/2019 10:00 A M Medical Record Number: [id number] Patient Account Number: [id number] Date of Birth/Sex: Treating RN: 1965/08/22 (54 y.o. [name] Primary Care Provider: Precious Haws Other Clinician: Referring Provider: Treating Provider/Extender: [name], [name] in Treatment: 15 Subjective Chief Complaint Information obtained from Patient 08/04/2019; patient is here for review of the wound on the right lateral lower leg History of Present Illness (HPI) ADMISSION 08/04/2019 This is a 54 year old man who traumatized his right lateral lower leg while going [place] an escalator at the mall sometime in December 2020. He was referred to vascular surgery and on 04/14/2019 he saw Dr. Wenda Overland. He was felt to have "palpable pulses and normal noninvasive studies" although I cannot see these results. He was back in his primary doctor's office on 07/30/2019 for a nonhealing right lower extremity wound. He was referred to wound care I think in [facility] but his insurance was out of network. A CNS was ordered. X-ray was done that was negative. He has been using wound cleanser and a Band-Aid. Past medical history is actually quite extensive and includes chronic kidney disease stage IV, chronic diastolic heart failure, atrial fibrillation, poorly controlled hypertension, poorly controlled diabetes with peripheral neuropathy and a recent hemoglobin A1c of 11.5, depression, lymphedema and sleep apnea ABI in our clinic was 1.1 in the right 5/6; this wound was initially traumatized while the patient was going down on an escalator. Arrived in clinic last week with a completely nonviable surface.  He required mechanical debridement and we have been using Iodoflex under compression. Apparently the compression wraps fell down. He also has poorly controlled diabetes 5/15; traumatic/contusion wound to the right lateral lower  leg. We have been using Iodoflex to clean at the surface of this which was completely nonviable along with mechanical debridements. He is under compression. 09/02/2019 upon evaluation today patient appears to be doing well with regard to his wound on his right lateral leg. He has been tolerating the dressing changes without complication. Fortunately there is no signs of active infection at this time. No fevers, chills, nausea, vomiting, or diarrhea. With that being said there is some necrotic tissue around the edges of the wound that is and require some sharp debridement today. That was discussed with the patient and his mother in the office today prior to debridement to which she did consent. 09/09/2019 upon evaluation today patient appears to be making progress in regard to his wound. Fortunately the wound is not nearly as deep as what it was. It is measuring a little bit larger due to having to clear some of the edges away but again that is necessary to get this to heal. Fortunately there is no evidence of active infection at this time. No fevers, chills, nausea, vomiting, or diarrhea. 09/16/2019 on evaluation today patient appears to be doing excellent in regard to his wound currently. In fact I do not see anything that we can need to debride today which is great news. The wound bed looks healthy and the edges of the wound look like they are doing great. Overall I feel like we are at a good place and headed in the proper direction. 09/23/2019 upon evaluation today patient appears to be doing okay in regard to his wound. This is not measuring any smaller it is about the same. With that being said he tells me that his wrap actually slid down he ended up having to take this off he  was on a trip and did not have his Velcro compression with him. With that being said I do not see any signs of anything significantly worsening which is good news. 09/30/2019 upon evaluation today patient appears to be doing a little bit better in regard to his leg ulcer. He has been tolerating the dressing changes without complication. Fortunately there is no signs of active infection at this time. No fevers, chills, nausea, vomiting, or diarrhea. 10/14/2019 upon evaluation today patient's wound appears to be healthier. Overall but unfortunately is measuring a little bit bigger. With that being said I do think that we do need to still continue to wrap his leg. Fortunately there is no signs of active infection at this time which is great news. 11/18/2019 upon evaluation today patient actually appears to be doing decently well with regard to his wound. He has been tolerating the dressing changes without complication. Fortunately there is no signs of active infection has been using just Neosporin at this point since he was in the hospital. He did have a venous Doppler study in the hospital though I cannot see the results that was from November 04, 2019. Nonetheless overall I am pleased with the fact the patient does seem to be doing better. Objective Constitutional Well-nourished and well-hydrated in no acute distress. Vitals Time Taken: 10:11 AM, Height: 66 in, Weight: 256 lbs, BMI: 41.3, Temperature: 98.2 F, Pulse: 82 bpm, Respiratory Rate: 18 breaths/min, Blood Pressure: 178/105 mmHg. Respiratory normal breathing without difficulty. Psychiatric this patient is able to make decisions and demonstrates good insight into disease process. Alert and Oriented x 3. pleasant and cooperative. General Notes: Upon inspection patient's wound bed actually showed signs of good granulation at this time there  does not appear to be any evidence of active infection which is great news and overall I feel like he is  making progress. I do think may be adding collagen to the base of the wound and letting him change this at home is a good option at this point. Integumentary (Hair, Skin) Wound #1 status is Open. Original cause of wound was Trauma. The wound is located on the Right,Lateral Lower Leg. The wound measures 1.9cm length x 1.2cm width x 0.1cm depth; 1.791cm^2 area and 0.179cm^3 volume. There is Fat Layer (Subcutaneous Tissue) Exposed exposed. There is no tunneling or undermining noted. There is a medium amount of serosanguineous drainage noted. The wound margin is flat and intact. There is large (67-100%) pink granulation within the wound bed. There is a small (1-33%) amount of necrotic tissue within the wound bed including Adherent Slough. 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 Dressing Change Frequency: Change Dressing every other day. Skin Barriers/Peri-Wound Care: Moisturizing lotion - to leg daily Wound Cleansing: Wound #1 Right,Lateral Lower Leg: May shower and wash wound with soap and water. Primary Wound Dressing: Wound #1 Right,Lateral Lower Leg: Silver Collagen - moisten with hydrogel or KY gel Secondary Dressing: Wound #1 Right,Lateral Lower Leg: Dry Gauze - secured with tape or foam border Edema Control: Wound #1 Right,Lateral Lower Leg: Avoid standing for long periods of time Elevate legs to the level of the heart or above for 30 minutes daily and/or when sitting, a frequency of: - throughout the day. Exercise regularly Other: - ace wrap 1. I am going to suggest that we switch to a collagen-based dressing and that we use hydrogel to moisten in order to help keep  things from drying out which I think is one of the main things that is helped in regard to the Neosporin. 2. I am also can recommend that we not use a compression wrap we will just use a dry gauze dressing secured with tape or border foam dressing and then subsequently the patient will use an Ace wrap over top of this. 3. I would recommend the patient elevate his legs as much as possible try to keep edema under good control. We will see patient back for reevaluation in 1 week here in the clinic. If anything worsens or changes patient will contact our office for additional recommendations. Electronic Signature(s) Signed: 11/18/2019 10:54:19 AM By: Worthy Keeler PA-C Entered By: Worthy Keeler on 11/18/2019 10:54:19 -------------------------------------------------------------------------------- SuperBill Details Patient Name: Date of Service: [name], [name] 11/18/2019 Medical Record Number: [id number] Patient Account Number: [id number] Date of Birth/Sex: Treating RN: October 13, 1965 (54 y.o. [name] Primary Care Provider: Precious Haws Other Clinician: Referring Provider: Treating Provider/Extender: [name], [name] in Treatment: 15 Diagnosis Coding ICD-10 Codes Code Description [phone] Abrasion, right lower leg, subsequent encounter L97.812 Non-pressure chronic ulcer of other part of right lower leg with fat layer exposed E11.622 Type 2 diabetes mellitus with other skin ulcer I89.0 Lymphedema, not elsewhere classified I87.2 Venous insufficiency (chronic) (peripheral) Facility Procedures CPT4 Code: [id number] Description: 99213 - WOUND CARE VISIT-LEV 3 EST PT Modifier: Quantity: 1 Physician Procedures : CPT4 Code Description Modifier [id number] - WC PHYS LEVEL 3 - EST PT ICD-10 Diagnosis Description S80.811D Abrasion, right lower  leg, subsequent encounter L97.812 Non-pressure chronic ulcer of other part of right lower leg with fat layer exposed  E11.622 Type  2 diabetes mellitus with other skin ulcer I89.0 Lymphedema, not elsewhere classified Quantity: 1 Electronic Signature(s) Signed: 11/18/2019 10:54:35 AM By: Worthy Keeler PA-C Entered By: Worthy Keeler on 11/18/2019 10:54:34

## 2019-11-20 NOTE — Progress Notes (Signed)
[name], [name] ([id number]) Visit Report for 11/18/2019 Arrival Information Details Patient Name: Date of Service: [name], [name] 11/18/2019 10:00 A M Medical Record Number: [id number] Patient Account Number: [id number] Date of Birth/Sex: Treating RN: 12/21/1965 (54 y.o. [name] Primary Care Joory Gough: Precious Haws Other Clinician: Referring Glynnis Gavel: Treating Anari Evitt/Extender: [name], [name] in Treatment: 15 Visit Information History Since Last Visit Added or deleted any medications: No Patient Arrived: Ambulatory Any new allergies or adverse reactions: No Arrival Time: 10:11 Had a fall or experienced change in No Accompanied By: self activities of daily living that may affect Transfer Assistance: None risk of falls: Patient Identification Verified: Yes Signs or symptoms of abuse/neglect since last visito No Secondary Verification Process Completed: Yes Hospitalized since last visit: No Patient Requires Transmission-Based Precautions: No Implantable device outside of the clinic excluding No Patient Has Alerts: No cellular tissue based products placed in the center since last visit: Has Dressing in Place as Prescribed: Yes Pain Present Now: No Electronic Signature(s) Signed: 11/20/2019 1:13:21 PM By: Sandre Kitty Entered By: Sandre Kitty on 11/18/2019 10:11:35 -------------------------------------------------------------------------------- Clinic Level of Care Assessment Details Patient Name: Date of Service: [name], [name] 11/18/2019 10:00 A M Medical Record Number: [id number] Patient Account Number: [id number] Date of Birth/Sex: Treating RN: Nov 14, 1965 (54 y.o. [name] Primary Care Brookelyn Gaynor: Precious Haws Other Clinician: Referring Vinny Taranto: Treating Aireana Ryland/Extender: [name], [name] in Treatment: 15 Clinic Level of Care Assessment Items TOOL 4 Quantity Score _0  - 0 Use when only an EandM is performed on  FOLLOW-UP visit ASSESSMENTS - Nursing Assessment / Reassessment X- 1 10 Reassessment of Co-morbidities (includes updates in patient status) X- 1 5 Reassessment of Adherence to Treatment Plan ASSESSMENTS - Wound and Skin A ssessment / Reassessment X - Simple Wound Assessment / Reassessment - one wound 1 5 _1  - 0 Complex Wound Assessment / Reassessment - multiple wounds _2  - 0 Dermatologic / Skin Assessment (not related to wound area) ASSESSMENTS - Focused Assessment X- 1 5 Circumferential Edema Measurements - multi extremities _3  - 0 Nutritional Assessment / Counseling / Intervention X- 1 5 Lower Extremity Assessment (monofilament, tuning fork, pulses) _4  - 0 Peripheral Arterial Disease Assessment (using hand held doppler) ASSESSMENTS - Ostomy and/or Continence Assessment and Care _5  - 0 Incontinence Assessment and Management _6  - 0 Ostomy Care Assessment and Management (repouching, etc.) PROCESS - Coordination of Care X - Simple Patient / Family Education for ongoing care 1 15 _7  - 0 Complex (extensive) Patient / Family Education for ongoing care X- 1 10 Staff obtains Programmer, systems, Records, T Results / Process Orders est _8  - 0 Staff telephones HHA, Nursing Homes / Clarify orders / etc _9  - 0 Routine Transfer to another Facility (non-emergent condition) _10  - 0 Routine Hospital Admission (non-emergent condition) _11  - 0 New Admissions / Biomedical engineer / Ordering NPWT Apligraf, etc. , _12  - 0 Emergency Hospital Admission (emergent condition) X- 1 10 Simple Discharge Coordination _13  - 0 Complex (extensive) Discharge Coordination PROCESS - Special Needs _14  - 0 Pediatric / Minor Patient Management _15  - 0 Isolation Patient Management _16  - 0 Hearing / Language / Visual special needs _17  - 0 Assessment of Community assistance (transportation, D/C planning, etc.) _18  - 0 Additional assistance / Altered mentation _19  - 0 Support Surface(s) Assessment (bed, cushion,  seat, etc.) INTERVENTIONS - Wound Cleansing / Measurement X - Simple Wound Cleansing - one wound 1 5 _20  - 0 Complex Wound Cleansing - multiple  wounds X- 1 5 Wound Imaging (photographs - any number of wounds) _0  - 0 Wound Tracing (instead of photographs) X- 1 5 Simple Wound Measurement - one wound _1  - 0 Complex Wound Measurement - multiple wounds INTERVENTIONS - Wound Dressings X - Small Wound Dressing one or multiple wounds 1 10 _2  - 0 Medium Wound Dressing one or multiple wounds _3  - 0 Large Wound Dressing one or multiple wounds X- 1 5 Application of Medications - topical <[id number]>_7<\/[id number]>_7  - 0 Application of Medications - injection INTERVENTIONS - Miscellaneous _5  - 0 External ear exam _6  - 0 Specimen Collection (cultures, biopsies, blood, body fluids, etc.) _7  - 0 Specimen(s) / Culture(s) sent or taken to Lab for analysis _8  - 0 Patient Transfer (multiple staff / Civil Service fast streamer / Similar devices) _9  - 0 Simple Staple / Suture removal (25 or less) _10  - 0 Complex Staple / Suture removal (26 or more) _11  - 0 Hypo / Hyperglycemic Management (close monitor of Blood Glucose) _12  - 0 Ankle / Brachial Index (ABI) - do not check if billed separately X- 1 5 Vital Signs Has the patient been seen at the hospital within the last three years: Yes Total Score: 100 Level Of Care: New/Established - Level 3 Electronic Signature(s) Signed: 11/18/2019 6:14:05 PM By: Baruch Gouty RN, BSN Entered By: Baruch Gouty on 11/18/2019 10:49:12 -------------------------------------------------------------------------------- Encounter Discharge Information Details Patient Name: Date of Service: [name], [name] 11/18/2019 10:00 A M Medical Record Number: [id number] Patient Account Number: [id number] Date of Birth/Sex: Treating RN: 06/20/1965 (54 y.o. [name] Primary Care Sanoe Hazan: Precious Haws Other Clinician: Referring Journie Howson: Treating Loann Chahal/Extender: [name], [name] in  Treatment: 15 Encounter Discharge Information Items Discharge Condition: Stable Ambulatory Status: Cane Discharge Destination: Home Transportation: Private Auto Accompanied By: self Schedule Follow-up Appointment: Yes Clinical Summary of Care: Patient Declined Electronic Signature(s) Signed: 11/18/2019 6:14:05 PM By: Baruch Gouty RN, BSN Entered By: Baruch Gouty on 11/18/2019 11:47:13 -------------------------------------------------------------------------------- Lower Extremity Assessment Details Patient Name: Date of Service: [name], [name] 11/18/2019 10:00 A M Medical Record Number: [id number] Patient Account Number: [id number] Date of Birth/Sex: Treating RN: 10-May-1965 (55 y.o. [name] Primary Care Corynn Solberg: Precious Haws Other Clinician: Referring Gladstone Rosas: Treating Abagayle Klutts/Extender: [name], [name] in Treatment: 15 Edema Assessment Assessed: [Left: No] [Right: No] Edema: [Left: Ye] [Right: s] Calf Left: Right: Point of Measurement: 31 cm From Medial Instep cm 44.4 cm Ankle Left: Right: Point of Measurement: 8 cm From Medial Instep cm 24.7 cm Vascular Assessment Pulses: Dorsalis Pedis Palpable: [Right:Yes] Electronic Signature(s) Signed: 11/18/2019 6:14:05 PM By: Baruch Gouty RN, BSN Entered By: Baruch Gouty on 11/18/2019 10:33:08 -------------------------------------------------------------------------------- [place] Details Patient Name: Date of Service: [name], [name] 11/18/2019 10:00 A M Medical Record Number: [id number] Patient Account Number: [id number] Date of Birth/Sex: Treating RN: 21-May-1965 (54 y.o. [name] Primary Care Janiel Crisostomo: Precious Haws Other Clinician: Referring Kory Rains: Treating Efraim Vanallen/Extender: [name], [name] in Treatment: 15 Active Inactive Venous Leg Ulcer Nursing Diagnoses: Knowledge deficit related to disease process and management Potential  for venous Insuffiency (use before diagnosis confirmed) Goals: Patient will maintain optimal edema control Date Initiated: 09/09/2019 Target Resolution Date: 12/02/2019 Goal Status: Active Patient/caregiver will verbalize understanding of disease process and disease management Date Initiated: 09/09/2019 Date Inactivated: 10/14/2019 Target Resolution Date: 10/07/2019 Goal Status: Met Interventions: Assess peripheral edema status every visit. Compression as ordered Provide education on venous insufficiency Treatment Activities: Therapeutic compression applied :  09/09/2019 Notes: Wound/Skin Impairment Nursing Diagnoses: Knowledge deficit related to ulceration/compromised skin integrity Goals: Patient/caregiver will verbalize understanding of skin care regimen Date Initiated: 08/04/2019 Target Resolution Date: 12/02/2019 Goal Status: Active Ulcer/skin breakdown will have a volume reduction of 30% by week 4 Date Initiated: 08/04/2019 Date Inactivated: 09/09/2019 Target Resolution Date: 09/11/2019 Goal Status: Met Ulcer/skin breakdown will have a volume reduction of 50% by week 8 Date Initiated: 09/09/2019 Date Inactivated: 10/14/2019 Target Resolution Date: 10/07/2019 Goal Status: Unmet Unmet Reason: awaiting reflux studies Ulcer/skin breakdown will have a volume reduction of 80% by week 12 Date Initiated: 10/14/2019 Date Inactivated: 11/18/2019 Target Resolution Date: 11/04/2019 Goal Status: Unmet Unmet Reason: uncontrolled htn Interventions: Assess patient/caregiver ability to obtain necessary supplies Assess patient/caregiver ability to perform ulcer/skin care regimen upon admission and as needed Assess ulceration(s) every visit Notes: Electronic Signature(s) Signed: 11/18/2019 6:14:05 PM By: Baruch Gouty RN, BSN Entered By: Baruch Gouty on 11/18/2019 10:48:12 -------------------------------------------------------------------------------- Pain Assessment Details Patient Name: Date of  Service: [name], [name] 11/18/2019 10:00 A M Medical Record Number: [id number] Patient Account Number: [id number] Date of Birth/Sex: Treating RN: 1965-06-15 (54 y.o. [name] Primary Care Elver Stadler: Precious Haws Other Clinician: Referring Wisdom Rickey: Treating Jadrien Narine/Extender: [name], [name] in Treatment: 15 Active Problems Location of Pain Severity and Description of Pain Patient Has Paino No Site Locations With Dressing Change: Yes Duration of the Pain. Constant / Intermittento Intermittent Rate the pain. Current Pain Level: 0 Worst Pain Level: 10 Least Pain Level: 0 Character of Pain Describe the Pain: Tender, Throbbing Pain Management and Medication Current Pain Management: Medication: Yes Is the Current Pain Management Adequate: Adequate How does your wound impact your activities of daily livingo Sleep: No Bathing: No Appetite: No Relationship With Others: No Bladder Continence: No Emotions: Yes Bowel Continence: No Work: No Toileting: No Drive: No Dressing: No Hobbies: No Electronic Signature(s) Signed: 11/18/2019 6:14:05 PM By: Baruch Gouty RN, BSN Entered By: Baruch Gouty on 11/18/2019 10:35:24 -------------------------------------------------------------------------------- Patient/Caregiver Education Details Patient Name: Date of Service: [name], [name] 8/11/2021andnbsp10:00 A M Medical Record Number: [id number] Patient Account Number: [id number] Date of Birth/Gender: Treating RN: Jul 06, 1965 (54 y.o. [name] Primary Care Physician: Precious Haws Other Clinician: Referring Physician: Treating Physician/Extender: Burnard Hawthorne in Treatment: 15 Education Assessment Education Provided To: Patient Education Topics Provided Venous: Methods: Explain/Verbal Responses: Reinforcements needed, State content correctly Wound/Skin Impairment: Methods: Explain/Verbal Responses: Reinforcements  needed, State content correctly Electronic Signature(s) Signed: 11/18/2019 6:14:05 PM By: Baruch Gouty RN, BSN Entered By: Baruch Gouty on 11/18/2019 10:48:34 -------------------------------------------------------------------------------- Wound Assessment Details Patient Name: Date of Service: [name], [name] 11/18/2019 10:00 A M Medical Record Number: [id number] Patient Account Number: [id number] Date of Birth/Sex: Treating RN: 1965/11/17 (53 y.o. [name] Primary Care Paris Hohn: Precious Haws Other Clinician: Referring Armaan Pond: Treating Annastyn Silvey/Extender: [name], [name] in Treatment: 15 Wound Status Wound Number: 1 Primary Diabetic Wound/Ulcer of the Lower Extremity Etiology: Wound Location: Right, Lateral Lower Leg Wound Open Wounding Event: Trauma Status: Date Acquired: 04/10/2019 Comorbid Lymphedema, Sleep Apnea, Arrhythmia, Congestive Heart Failure, Weeks Of Treatment: 15 History: Hypertension, Peripheral Arterial Disease, Type II Diabetes Clustered Wound: No Photos Photo Uploaded By: Mikeal Hawthorne on 11/19/2019 14:12:29 Wound Measurements Length: (cm) 1.9 Width: (cm) 1.2 Depth: (cm) 0.1 Area: (cm) 1.791 Volume: (cm) 0.179 % Reduction in Area: -107.3% % Reduction in Volume: -3.5% Epithelialization: Small (1-33%) Tunneling: No Undermining: No Wound Description Classification: Grade 2 Wound Margin: Flat  and Intact Exudate Amount: Medium Exudate Type: Serosanguineous Exudate Color: red, brown Foul Odor After Cleansing: No Slough/Fibrino Yes Wound Bed Granulation Amount: Large (67-100%) Exposed Structure Granulation Quality: Pink Fascia Exposed: No Necrotic Amount: Small (1-33%) Fat Layer (Subcutaneous Tissue) Exposed: Yes Necrotic Quality: Adherent Slough Tendon Exposed: No Muscle Exposed: No Joint Exposed: No Bone Exposed: No Treatment Notes Wound #1 (Right, Lateral Lower Leg) 2. Periwound Care Skin Prep 3. Primary  Dressing Applied Colle[employer] 4. Secondary Dressing Foam Border Dressing 6. Support Layer Education officer, community) Signed: 11/18/2019 6:14:05 PM By: Baruch Gouty RN, BSN Entered By: Baruch Gouty on 11/18/2019 10:33:50 -------------------------------------------------------------------------------- Vitals Details Patient Name: Date of Service: [name], [name] 11/18/2019 10:00 A M Medical Record Number: [id number] Patient Account Number: [id number] Date of Birth/Sex: Treating RN: 03/04/1966 (54 y.o. [name] Primary Care Holli Rengel: Precious Haws Other Clinician: Referring Mekel Haverstock: Treating Lakelyn Straus/Extender: [name], [name] in Treatment: 15 Vital Signs Time Taken: 10:11 Temperature (F): 98.2 Height (in): 66 Pulse (bpm): 82 Weight (lbs): 256 Respiratory Rate (breaths/min): 18 Body Mass Index (BMI): 41.3 Blood Pressure (mmHg): 178/105 Reference Range: 80 - 120 mg / dl Electronic Signature(s) Signed: 11/20/2019 1:13:21 PM By: Sandre Kitty Entered By: Sandre Kitty on 11/18/2019 10:12:44

## 2019-11-25 ENCOUNTER — Encounter (HOSPITAL_BASED_OUTPATIENT_CLINIC_OR_DEPARTMENT_OTHER): Payer: Medicare Other | Admitting: Physician Assistant

## 2019-11-25 DIAGNOSIS — E11622 Type 2 diabetes mellitus with other skin ulcer: Secondary | ICD-10-CM | POA: Diagnosis not present

## 2019-11-25 NOTE — Progress Notes (Addendum)
[name], [name] ([id number]) Visit Report for 11/25/2019 Chief Complaint Document Details Patient Name: Date of Service: [name], [name] 11/25/2019 7:30 A M Medical Record Number: [id number] Patient Account Number: [id number] Date of Birth/Sex: Treating RN: 1965/04/18 (54 y.o. [name] Primary Care Provider: Precious Haws Other Clinician: Referring Provider: Treating Provider/Extender: [name], [name] in Treatment: 16 Information Obtained from: Patient Chief Complaint 08/04/2019; patient is here for review of the wound on the right lateral lower leg Electronic Signature(s) Signed: 11/25/2019 8:09:35 AM By: Worthy Keeler PA-C Entered By: Worthy Keeler on 11/25/2019 08:09:34 -------------------------------------------------------------------------------- HPI Details Patient Name: Date of Service: [name], [name] 11/25/2019 7:30 A M Medical Record Number: [id number] Patient Account Number: [id number] Date of Birth/Sex: Treating RN: 10-Oct-1965 (54 y.o. [name] Primary Care Provider: Precious Haws Other Clinician: Referring Provider: Treating Provider/Extender: [name], [name] in Treatment: 16 History of Present Illness HPI Description: ADMISSION 08/04/2019 This is a 54 year old man who traumatized his right lateral lower leg while going [place] escal[place] at the mall sometime in December 2020. He was referred to vascular surgery and on 04/14/2019 he saw Dr. Wenda Overland. He was felt to have "palpable pulses and normal noninvasive studies" although I cannot see these results. He was back in his primary doctor's office on 07/30/2019 for a nonhealing right lower extremity wound. He was referred to wound care I think in [facility] but his insurance was out of network. A CNS was ordered. X-ray was done that was negative. He has been using wound cleanser and a Band-Aid. Past medical history is actually quite extensive and includes chronic kidney  disease stage IV, chronic diastolic heart failure, atrial fibrillation, poorly controlled hypertension, poorly controlled diabetes with peripheral neuropathy and a recent hemoglobin A1c of 11.5, depression, lymphedema and sleep apnea ABI in our clinic was 1.1 in the right 5/6; this wound was initially traumatized while the patient was going down on an escalator. Arrived in clinic last week with a completely nonviable surface. He required mechanical debridement and we have been using Iodoflex under compression. Apparently the compression wraps fell down. He also has poorly controlled diabetes 5/15; traumatic/contusion wound to the right lateral lower leg. We have been using Iodoflex to clean at the surface of this which was completely nonviable along with mechanical debridements. He is under compression. 09/02/2019 upon evaluation today patient appears to be doing well with regard to his wound on his right lateral leg. He has been tolerating the dressing changes without complication. Fortunately there is no signs of active infection at this time. No fevers, chills, nausea, vomiting, or diarrhea. With that being said there is some necrotic tissue around the edges of the wound that is and require some sharp debridement today. That was discussed with the patient and his mother in the office today prior to debridement to which she did consent. 09/09/2019 upon evaluation today patient appears to be making progress in regard to his wound. Fortunately the wound is not nearly as deep as what it was. It is measuring a little bit larger due to having to clear some of the edges away but again that is necessary to get this to heal. Fortunately there is no evidence of active infection at this time. No fevers, chills, nausea, vomiting, or diarrhea. 09/16/2019 on evaluation today patient appears to be doing excellent in regard to his wound currently. In fact I do not see anything that we can need to debride  today which  is great news. The wound bed looks healthy and the edges of the wound look like they are doing great. Overall I feel like we are at a good place and headed in the proper direction. 09/23/2019 upon evaluation today patient appears to be doing okay in regard to his wound. This is not measuring any smaller it is about the same. With that being said he tells me that his wrap actually slid down he ended up having to take this off he was on a trip and did not have his Velcro compression with him. With that being said I do not see any signs of anything significantly worsening which is good news. 09/30/2019 upon evaluation today patient appears to be doing a little bit better in regard to his leg ulcer. He has been tolerating the dressing changes without complication. Fortunately there is no signs of active infection at this time. No fevers, chills, nausea, vomiting, or diarrhea. 10/14/2019 upon evaluation today patient's wound appears to be healthier. Overall but unfortunately is measuring a little bit bigger. With that being said I do think that we do need to still continue to wrap his leg. Fortunately there is no signs of active infection at this time which is great news. 11/18/2019 upon evaluation today patient actually appears to be doing decently well with regard to his wound. He has been tolerating the dressing changes without complication. Fortunately there is no signs of active infection has been using just Neosporin at this point since he was in the hospital. He did have a venous Doppler study in the hospital though I cannot see the results that was from November 04, 2019. Nonetheless overall I am pleased with the fact the patient does seem to be doing better. 11/25/2019 on evaluation today patient appears to be doing well with regard to his wounds. He has been tolerating the dressing changes without complication. Fortunately there is no signs of active infection at this time. No fevers, chills, nausea,  vomiting, or diarrhea. I do believe that he does not appear to be showing any signs of worsening although he also seems to be having some trouble getting this to proceed in a good fashion towards healing. Electronic Signature(s) Signed: 11/25/2019 8:49:27 AM By: Worthy Keeler PA-C Entered By: Worthy Keeler on 11/25/2019 08:49:26 -------------------------------------------------------------------------------- Physical Exam Details Patient Name: Date of Service: [name], [name] 11/25/2019 7:30 A M Medical Record Number: [id number] Patient Account Number: [id number] Date of Birth/Sex: Treating RN: [birth year] (54 y.o. [name] Primary Care Provider: Precious Haws Other Clinician: Referring Provider: Treating Provider/Extender: [name], [name] in Treatment: 27 Constitutional Well-nourished and well-hydrated in no acute distress. Respiratory normal breathing without difficulty. Psychiatric this patient is able to make decisions and demonstrates good insight into disease process. Alert and Oriented x 3. pleasant and cooperative. Notes Patient's wound bed showed signs of minimal slough buildup I was able to mechanically debride this away with saline gauze. Fortunately he tolerated that without complication. Electronic Signature(s) Signed: 11/25/2019 8:49:39 AM By: Worthy Keeler PA-C Entered By: Worthy Keeler on 11/25/2019 08:49:39 -------------------------------------------------------------------------------- Physician Orders Details Patient Name: Date of Service: [name], [name] 11/25/2019 7:30 A M Medical Record Number: [id number] Patient Account Number: [id number] Date of Birth/Sex: Treating RN: 1965-06-02 (54 y.o. [name] Primary Care Provider: Precious Haws Other Clinician: Referring Provider: Treating Provider/Extender: [name], [name] in Treatment: [phone] Verbal / Phone Orders: No Diagnosis Coding ICD-10  Coding Code  Description S80.811D Abrasion, right lower leg, subsequent encounter L97.812 Non-pressure chronic ulcer of other part of right lower leg with fat layer exposed E11.622 Type 2 diabetes mellitus with other skin ulcer I89.0 Lymphedema, not elsewhere classified I87.2 Venous insufficiency (chronic) (peripheral) Follow-up Appointments Return Appointment in 1 week. Dressing Change Frequency Change Dressing every other day. Skin Barriers/Peri-Wound Care Moisturizing lotion - to leg daily Wound Cleansing Wound #1 Right,Lateral Lower Leg May shower and wash wound with soap and water. Primary Wound Dressing Wound #1 Right,Lateral Lower Leg Silver Collagen - moisten with hydrogel or KY gel Secondary Dressing Wound #1 Right,Lateral Lower Leg Dry Gauze - secured with tape or foam border Edema Control Wound #1 Right,Lateral Lower Leg Avoid standing for long periods of time Elevate legs to the level of the heart or above for 30 minutes daily and/or when sitting, a frequency of: - throughout the day. Exercise regularly Other: - ace wrap - wrap up to base of knee Electronic Signature(s) Signed: 11/25/2019 5:00:27 PM By: Worthy Keeler PA-C Signed: 11/25/2019 5:37:29 PM By: Baruch Gouty RN, BSN Entered By: Baruch Gouty on 11/25/2019 08:16:52 -------------------------------------------------------------------------------- Problem List Details Patient Name: Date of Service: [name], [name] 11/25/2019 7:30 A M Medical Record Number: [id number] Patient Account Number: [id number] Date of Birth/Sex: Treating RN: Aug 02, 1965 (54 y.o. [name] Primary Care Provider: Precious Haws Other Clinician: Referring Provider: Treating Provider/Extender: Katheran Awe Weeks in Treatment: 16 Active Problems ICD-10 Encounter Code Description Active Date MDM Diagnosis S80.811D Abrasion, right lower leg, subsequent encounter 08/04/2019 No Yes L97.812 Non-pressure chronic ulcer of  other part of right lower leg with fat layer 08/04/2019 No Yes exposed E11.622 Type 2 diabetes mellitus with other skin ulcer 08/04/2019 No Yes I89.0 Lymphedema, not elsewhere classified 08/04/2019 No Yes I87.2 Venous insufficiency (chronic) (peripheral) 09/30/2019 No Yes Inactive Problems Resolved Problems Electronic Signature(s) Signed: 11/25/2019 8:09:25 AM By: Worthy Keeler PA-C Entered By: Worthy Keeler on 11/25/2019 08:09:25 -------------------------------------------------------------------------------- Progress Note Details Patient Name: Date of Service: [name], [name] 11/25/2019 7:30 A M Medical Record Number: [id number] Patient Account Number: [id number] Date of Birth/Sex: Treating RN: 1965/07/22 (54 y.o. [name] Primary Care Provider: Precious Haws Other Clinician: Referring Provider: Treating Provider/Extender: Katheran Awe Weeks in Treatment: 16 Subjective Chief Complaint Information obtained from Patient 08/04/2019; patient is here for review of the wound on the right lateral lower leg History of Present Illness (HPI) ADMISSION 08/04/2019 This is a 55 year old man who traumatized his right lateral lower leg while going [place] the mall sometime in December 2020. He was referred to vascular surgery and on 04/14/2019 he saw Dr. Wenda Overland. He was felt to have "palpable pulses and normal noninvasive studies" although I cannot see these results. He was back in his primary doctor's office on 07/30/2019 for a nonhealing right lower extremity wound. He was referred to wound care I think in [facility] but his insurance was out of network. A CNS was ordered. X-ray was done that was negative. He has been using wound cleanser and a Band-Aid. Past medical history is actually quite extensive and includes chronic kidney disease stage IV, chronic diastolic heart failure, atrial fibrillation, poorly controlled hypertension, poorly controlled diabetes with  peripheral neuropathy and a recent hemoglobin A1c of 11.5, depression, lymphedema and sleep apnea ABI in our clinic was 1.1 in the right 5/6; this wound was initially traumatized while the patient was going down on  an [employer]. Arrived in clinic last week with a completely nonviable surface. He required mechanical debridement and we have been using Iodoflex under compression. Apparently the compression wraps fell down. He also has poorly controlled diabetes 5/15; traumatic/contusion wound to the right lateral lower leg. We have been using Iodoflex to clean at the surface of this which was completely nonviable along with mechanical debridements. He is under compression. 09/02/2019 upon evaluation today patient appears to be doing well with regard to his wound on his right lateral leg. He has been tolerating the dressing changes without complication. Fortunately there is no signs of active infection at this time. No fevers, chills, nausea, vomiting, or diarrhea. With that being said there is some necrotic tissue around the edges of the wound that is and require some sharp debridement today. That was discussed with the patient and his mother in the office today prior to debridement to which she did consent. 09/09/2019 upon evaluation today patient appears to be making progress in regard to his wound. Fortunately the wound is not nearly as deep as what it was. It is measuring a little bit larger due to having to clear some of the edges away but again that is necessary to get this to heal. Fortunately there is no evidence of active infection at this time. No fevers, chills, nausea, vomiting, or diarrhea. 09/16/2019 on evaluation today patient appears to be doing excellent in regard to his wound currently. In fact I do not see anything that we can need to debride today which is great news. The wound bed looks healthy and the edges of the wound look like they are doing great. Overall I feel like we are at a good  place and headed in the proper direction. 09/23/2019 upon evaluation today patient appears to be doing okay in regard to his wound. This is not measuring any smaller it is about the same. With that being said he tells me that his wrap actually slid down he ended up having to take this off he was on a trip and did not have his Velcro compression with him. With that being said I do not see any signs of anything significantly worsening which is good news. 09/30/2019 upon evaluation today patient appears to be doing a little bit better in regard to his leg ulcer. He has been tolerating the dressing changes without complication. Fortunately there is no signs of active infection at this time. No fevers, chills, nausea, vomiting, or diarrhea. 10/14/2019 upon evaluation today patient's wound appears to be healthier. Overall but unfortunately is measuring a little bit bigger. With that being said I do think that we do need to still continue to wrap his leg. Fortunately there is no signs of active infection at this time which is great news. 11/18/2019 upon evaluation today patient actually appears to be doing decently well with regard to his wound. He has been tolerating the dressing changes without complication. Fortunately there is no signs of active infection has been using just Neosporin at this point since he was in the hospital. He did have a venous Doppler study in the hospital though I cannot see the results that was from November 04, 2019. Nonetheless overall I am pleased with the fact the patient does seem to be doing better. 11/25/2019 on evaluation today patient appears to be doing well with regard to his wounds. He has been tolerating the dressing changes without complication. Fortunately there is no signs of active infection at this time. No fevers,  chills, nausea, vomiting, or diarrhea. I do believe that he does not appear to be showing any signs of worsening although he also seems to be having some  trouble getting this to proceed in a good fashion towards healing. Objective Constitutional Well-nourished and well-hydrated in no acute distress. Vitals Time Taken: 7:49 AM, Height: 66 in, Weight: 256 lbs, BMI: 41.3, Temperature: 97.7 F, Pulse: 77 bpm, Respiratory Rate: 18 breaths/min, Blood Pressure: 127/83 mmHg, Capillary Blood Glucose: 150 mg/dl. General Notes: glucose per pt report Respiratory normal breathing without difficulty. Psychiatric this patient is able to make decisions and demonstrates good insight into disease process. Alert and Oriented x 3. pleasant and cooperative. General Notes: Patient's wound bed showed signs of minimal slough buildup I was able to mechanically debride this away with saline gauze. Fortunately he tolerated that without complication. Integumentary (Hair, Skin) Wound #1 status is Open. Original cause of wound was Trauma. The wound is located on the Right,Lateral Lower Leg. The wound measures 1.9cm length x 1.4cm width x 0.1cm depth; 2.089cm^2 area and 0.209cm^3 volume. There is Fat Layer (Subcutaneous Tissue) Exposed exposed. There is no tunneling or undermining noted. There is a medium amount of serosanguineous drainage noted. The wound margin is flat and intact. There is large (67-100%) red, pink granulation within the wound bed. There is a small (1-33%) amount of necrotic tissue within the wound bed including Adherent Slough. 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 Dressing Change Frequency: Change Dressing every other day. Skin Barriers/Peri-Wound Care: Moisturizing lotion - to leg daily Wound Cleansing: Wound #1 Right,Lateral Lower  Leg: May shower and wash wound with soap and water. Primary Wound Dressing: Wound #1 Right,Lateral Lower Leg: Silver Collagen - moisten with hydrogel or KY gel Secondary Dressing: Wound #1 Right,Lateral Lower Leg: Dry Gauze - secured with tape or foam border Edema Control: Wound #1 Right,Lateral Lower Leg: Avoid standing for long periods of time Elevate legs to the level of the heart or above for 30 minutes daily and/or when sitting, a frequency of: - throughout the day. Exercise regularly Other: - ace wrap - wrap up to base of knee 1. I would recommend currently that we continue with the wound care measures as before specifically with regard to the silver collagen will see how things look next week. Depending how it appears I may switch the dressing but again I want to give this a little bit more time. 2. I am also can recommend the patient continue with the Ace wrap and changing the dressing every other day with that being said he needs to make sure the Ace wrap comes all the way up to just below his knee. We will see patient back for reevaluation in 1 week here in the clinic. If anything worsens or changes patient will contact our office for additional recommendations. Electronic Signature(s) Signed: 11/25/2019 8:50:18 AM By: Worthy Keeler PA-C Entered By: Worthy Keeler on 11/25/2019 08:50:18 -------------------------------------------------------------------------------- SuperBill Details Patient Name: Date of Service: [name], [name] 11/25/2019 Medical Record Number: [id number] Patient Account Number: [id number] Date of Birth/Sex: Treating RN: 07-14-1965 (54 y.o. 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 2 diabetes mellitus with other skin ulcer I89.0 Lymphedema, not elsewhere classified I87.2 Venous insufficiency (chronic) (peripheral) Facility Procedures CPT4 Code: [id number] Description: 99213 - WOUND CARE VISIT-LEV 3 EST PT Modifier: Quantity: 1 Physician Procedures : CPT4 Code Description Modifier [id number] - WC PHYS LEVEL 3 - EST PT ICD-10 Diagnosis Description S80.811D Abrasion, right lower leg, subsequent encounter L97.812 Non-pressure chronic ulcer of other part of right lower leg with fat layer exposed  E11.622 Type 2 diabetes mellitus with other skin ulcer I89.0 Lymphedema, not elsewhere classified Quantity: 1 Electronic Signature(s) Signed: 11/25/2019 8:50:28 AM By: Worthy Keeler PA-C Entered By: Worthy Keeler on 11/25/2019 08:50:28

## 2019-12-02 ENCOUNTER — Encounter (HOSPITAL_BASED_OUTPATIENT_CLINIC_OR_DEPARTMENT_OTHER): Payer: Medicare Other | Admitting: Physician Assistant

## 2019-12-02 DIAGNOSIS — E11622 Type 2 diabetes mellitus with other skin ulcer: Secondary | ICD-10-CM | POA: Diagnosis not present

## 2019-12-02 NOTE — Progress Notes (Signed)
[name], [name] ([id number]) Visit Report for 12/02/2019 Chief Complaint Document Details Patient Name: Date of Service: [name], [name] 12/02/2019 7:30 A M Medical Record Number: [id number] Patient Account Number: [id number] Date of Birth/Sex: Treating RN: July 05, 1965 (54 y.o. [name] Primary Care Provider: Precious Haws Other Clinician: Referring Provider: Treating Provider/Extender: [name], [name] in Treatment: 17 Information Obtained from: Patient Chief Complaint 08/04/2019; patient is here for review of the wound on the right lateral lower leg Electronic Signature(s) Signed: 12/02/2019 9:08:38 AM By: Worthy Keeler PA-C Entered By: Worthy Keeler on 12/02/2019 09:08:38 -------------------------------------------------------------------------------- HPI Details Patient Name: Date of Service: [name], [name] 12/02/2019 7:30 A M Medical Record Number: [id number] Patient Account Number: [id number] Date of Birth/Sex: Treating RN: 03-Jul-1965 (54 y.o. [name] Primary Care Provider: Precious Haws Other Clinician: Referring Provider: Treating Provider/Extender: [name], [name] in Treatment: 17 History of Present Illness HPI Description: ADMISSION 08/04/2019 This is a 54 year old man who traumatized his right lateral lower leg while going up an escalator at the mall sometime in December 2020. He was referred to vascular surgery and on 04/14/2019 he saw Dr. Wenda Overland. He was felt to have "palpable pulses and normal noninvasive studies" although I cannot see these results. He was back in his primary doctor's office on 07/30/2019 for a nonhealing right lower extremity wound. He was referred to wound care I think in [facility] but his insurance was out of network. A CNS was ordered. X-ray was done that was negative. He has been using wound cleanser and a Band-Aid. Past medical history is actually quite extensive and includes chronic kidney  disease stage IV, chronic diastolic heart failure, atrial fibrillation, poorly controlled hypertension, poorly controlled diabetes with peripheral neuropathy and a recent hemoglobin A1c of 11.5, depression, lymphedema and sleep apnea ABI in our clinic was 1.1 in the right 5/6; this wound was initially traumatized while the patient was going down on an escalator. Arrived in clinic last week with a completely nonviable surface. He required mechanical debridement and we have been using Iodoflex under compression. Apparently the compression wraps fell down. He also has poorly controlled diabetes 5/15; traumatic/contusion wound to the right lateral lower leg. We have been using Iodoflex to clean at the surface of this which was completely nonviable along with mechanical debridements. He is under compression. 09/02/2019 upon evaluation today patient appears to be doing well with regard to his wound on his right lateral leg. He has been tolerating the dressing changes without complication. Fortunately there is no signs of active infection at this time. No fevers, chills, nausea, vomiting, or diarrhea. With that being said there is some necrotic tissue around the edges of the wound that is and require some sharp debridement today. That was discussed with the patient and his mother in the office today prior to debridement to which she did consent. 09/09/2019 upon evaluation today patient appears to be making progress in regard to his wound. Fortunately the wound is not nearly as deep as what it was. It is measuring a little bit larger due to having to clear some of the edges away but again that is necessary to get this to heal. Fortunately there is no evidence of active infection at this time. No fevers, chills, nausea, vomiting, or diarrhea. 09/16/2019 on evaluation today patient appears to be doing excellent in regard to his wound currently. In fact I do not see anything that we can need to debride  today which  is great news. The wound bed looks healthy and the edges of the wound look like they are doing great. Overall I feel like we are at a good place and headed in the proper direction. 09/23/2019 upon evaluation today patient appears to be doing okay in regard to his wound. This is not measuring any smaller it is about the same. With that being said he tells me that his wrap actually slid down he ended up having to take this off he was on a trip and did not have his Velcro compression with him. With that being said I do not see any signs of anything significantly worsening which is good news. 09/30/2019 upon evaluation today patient appears to be doing a little bit better in regard to his leg ulcer. He has been tolerating the dressing changes without complication. Fortunately there is no signs of active infection at this time. No fevers, chills, nausea, vomiting, or diarrhea. 10/14/2019 upon evaluation today patient's wound appears to be healthier. Overall but unfortunately is measuring a little bit bigger. With that being said I do think that we do need to still continue to wrap his leg. Fortunately there is no signs of active infection at this time which is great news. 11/18/2019 upon evaluation today patient actually appears to be doing decently well with regard to his wound. He has been tolerating the dressing changes without complication. Fortunately there is no signs of active infection has been using just Neosporin at this point since he was in the hospital. He did have a venous Doppler study in the hospital though I cannot see the results that was from November 04, 2019. Nonetheless overall I am pleased with the fact the patient does seem to be doing better. 11/25/2019 on evaluation today patient appears to be doing well with regard to his wounds. He has been tolerating the dressing changes without complication. Fortunately there is no signs of active infection at this time. No fevers, chills, nausea,  vomiting, or diarrhea. I do believe that he does not appear to be showing any signs of worsening although he also seems to be having some trouble getting this to proceed in a good fashion towards healing. 12/02/2019 on evaluation today patient actually appears to be doing not quite as well in regard to his wounds. Both have some slough noted on the surface of the wound. Unfortunately he is continuing to have some issues here with pain as well in the anterior portion which I think is an issue. Fortunately there is no signs of active infection at this time which is great news. No fevers, chills, nausea, vomiting, or diarrhea. Electronic Signature(s) Signed: 12/02/2019 9:25:29 AM By: Worthy Keeler PA-C Entered By: Worthy Keeler on 12/02/2019 09:25:28 -------------------------------------------------------------------------------- Physical Exam Details Patient Name: Date of Service: [name], [name] 12/02/2019 7:30 A M Medical Record Number: [id number] Patient Account Number: [id number] Date of Birth/Sex: Treating RN: 1966/02/11 (55 y.o. [name] Primary Care Provider: Precious Haws Other Clinician: Referring Provider: Treating Provider/Extender: [name], [name] in Treatment: 20 Constitutional Well-nourished and well-hydrated in no acute distress. Respiratory normal breathing without difficulty. Psychiatric this patient is able to make decisions and demonstrates good insight into disease process. Alert and Oriented x 3. pleasant and cooperative. Notes Patient's wounds today are somewhat painful even with cleaning with saline gauze I think a lot of the slough is very adherent and I am not sure he would tolerate sharp debridement quite yet.  I really think we need to reinitiate the compression wrap followed by utilization of Iodoflex to try to help loosen up some of the necrotic tissue he is in agreement with that plan. Electronic Signature(s) Signed: 12/02/2019 9:25:49  AM By: Worthy Keeler PA-C Entered By: Worthy Keeler on 12/02/2019 09:25:48 -------------------------------------------------------------------------------- Physician Orders Details Patient Name: Date of Service: [name], [name] 12/02/2019 7:30 A M Medical Record Number: [id number] Patient Account Number: [id number] Date of Birth/Sex: Treating RN: Sep 19, 1965 (54 y.o. [name] Primary Care Provider: Precious Haws Other Clinician: Referring Provider: Treating Provider/Extender: Katheran Awe Weeks in Treatment: [phone] Verbal / Phone Orders: No Diagnosis Coding ICD-10 Coding Code Description S80.811D Abrasion, right lower leg, subsequent encounter L97.812 Non-pressure chronic ulcer of other part of right lower leg with fat layer exposed E11.622 Type 2 diabetes mellitus with other skin ulcer I89.0 Lymphedema, not elsewhere classified I87.2 Venous insufficiency (chronic) (peripheral) Follow-up Appointments Return Appointment in 1 week. Dressing Change Frequency Do not change entire dressing for one week. Skin Barriers/Peri-Wound Care Moisturizing lotion - to leg daily Wound Cleansing May shower with protection. Primary Wound Dressing Wound #1 Right,Lateral Lower Leg Iodoflex Wound #2 Right,Anterior Lower Leg Iodoflex Secondary Dressing Wound #1 Right,Lateral Lower Leg Dry Gauze Wound #2 Right,Anterior Lower Leg Dry Gauze Edema Control Wound #1 Right,Lateral Lower Leg 3 Layer Compression System - Right Lower Extremity Avoid standing for long periods of time Elevate legs to the level of the heart or above for 30 minutes daily and/or when sitting, a frequency of: - throughout the day. Exercise regularly Electronic Signature(s) Signed: 12/02/2019 6:40:41 PM By: Baruch Gouty RN, BSN Signed: 12/02/2019 7:06:00 PM By: Worthy Keeler PA-C Entered By: Baruch Gouty on 12/02/2019  08:53:16 -------------------------------------------------------------------------------- Problem List Details Patient Name: Date of Service: [name], [name] 12/02/2019 7:30 A M Medical Record Number: [id number] Patient Account Number: [id number] Date of Birth/Sex: Treating RN: Sep 03, 1965 (54 y.o. [name] Primary Care Provider: Precious Haws Other Clinician: Referring Provider: Treating Provider/Extender: Katheran Awe Weeks in Treatment: 17 Active Problems ICD-10 Encounter Encounter Code Description Active Date MDM Diagnosis S80.811D Abrasion, right lower leg, subsequent encounter 08/04/2019 No Yes L97.812 Non-pressure chronic ulcer of other part of right lower leg with fat layer 08/04/2019 No Yes exposed E11.622 Type 2 diabetes mellitus with other skin ulcer 08/04/2019 No Yes I89.0 Lymphedema, not elsewhere classified 08/04/2019 No Yes I87.2 Venous insufficiency (chronic) (peripheral) 09/30/2019 No Yes Inactive Problems Resolved Problems Electronic Signature(s) Signed: 12/02/2019 9:08:27 AM By: Worthy Keeler PA-C Entered By: Worthy Keeler on 12/02/2019 09:08:27 -------------------------------------------------------------------------------- Progress Note Details Patient Name: Date of Service: [name], [name] 12/02/2019 7:30 A M Medical Record Number: [id number] Patient Account Number: [id number] Date of Birth/Sex: Treating RN: [birth year] (54 y.o. [name] Primary Care Provider: Precious Haws Other Clinician: Referring Provider: Treating Provider/Extender: [name], [name] in Treatment: 17 Subjective Chief Complaint Information obtained from Patient 08/04/2019; patient is here for review of the wound on the right lateral lower leg History of Present Illness (HPI) ADMISSION 08/04/2019 This is a 54 year old man who traumatized his right lateral lower leg while going up an escalator at the mall sometime in December 2020. He was  referred to vascular surgery and on 04/14/2019 he saw Dr. Wenda Overland. He was felt to have "palpable pulses and normal noninvasive studies" although I cannot see these results. He was back in his primary doctor's office on 07/30/2019 for a  nonhealing right lower extremity wound. He was referred to wound care I think in [facility] but his insurance was out of network. A CNS was ordered. X-ray was done that was negative. He has been using wound cleanser and a Band-Aid. Past medical history is actually quite extensive and includes chronic kidney disease stage IV, chronic diastolic heart failure, atrial fibrillation, poorly controlled hypertension, poorly controlled diabetes with peripheral neuropathy and a recent hemoglobin A1c of 11.5, depression, lymphedema and sleep apnea ABI in our clinic was 1.1 in the right 5/6; this wound was initially traumatized while the patient was going down on an escalator. Arrived in clinic last week with a completely nonviable surface. He required mechanical debridement and we have been using Iodoflex under compression. Apparently the compression wraps fell down. He also has poorly controlled diabetes 5/15; traumatic/contusion wound to the right lateral lower leg. We have been using Iodoflex to clean at the surface of this which was completely nonviable along with mechanical debridements. He is under compression. 09/02/2019 upon evaluation today patient appears to be doing well with regard to his wound on his right lateral leg. He has been tolerating the dressing changes without complication. Fortunately there is no signs of active infection at this time. No fevers, chills, nausea, vomiting, or diarrhea. With that being said there is some necrotic tissue around the edges of the wound that is and require some sharp debridement today. That was discussed with the patient and his mother in the office today prior to debridement to which she did consent. 09/09/2019 upon evaluation  today patient appears to be making progress in regard to his wound. Fortunately the wound is not nearly as deep as what it was. It is measuring a little bit larger due to having to clear some of the edges away but again that is necessary to get this to heal. Fortunately there is no evidence of active infection at this time. No fevers, chills, nausea, vomiting, or diarrhea. 09/16/2019 on evaluation today patient appears to be doing excellent in regard to his wound currently. In fact I do not see anything that we can need to debride today which is great news. The wound bed looks healthy and the edges of the wound look like they are doing great. Overall I feel like we are at a good place and headed in the proper direction. 09/23/2019 upon evaluation today patient appears to be doing okay in regard to his wound. This is not measuring any smaller it is about the same. With that being said he tells me that his wrap actually slid down he ended up having to take this off he was on a trip and did not have his Velcro compression with him. With that being said I do not see any signs of anything significantly worsening which is good news. 09/30/2019 upon evaluation today patient appears to be doing a little bit better in regard to his leg ulcer. He has been tolerating the dressing changes without complication. Fortunately there is no signs of active infection at this time. No fevers, chills, nausea, vomiting, or diarrhea. 10/14/2019 upon evaluation today patient's wound appears to be healthier. Overall but unfortunately is measuring a little bit bigger. With that being said I do think that we do need to still continue to wrap his leg. Fortunately there is no signs of active infection at this time which is great news. 11/18/2019 upon evaluation today patient actually appears to be doing decently well with regard to his wound. He  has been tolerating the dressing changes without complication. Fortunately there is no signs  of active infection has been using just Neosporin at this point since he was in the hospital. He did have a venous Doppler study in the hospital though I cannot see the results that was from November 04, 2019. Nonetheless overall I am pleased with the fact the patient does seem to be doing better. 11/25/2019 on evaluation today patient appears to be doing well with regard to his wounds. He has been tolerating the dressing changes without complication. Fortunately there is no signs of active infection at this time. No fevers, chills, nausea, vomiting, or diarrhea. I do believe that he does not appear to be showing any signs of worsening although he also seems to be having some trouble getting this to proceed in a good fashion towards healing. 12/02/2019 on evaluation today patient actually appears to be doing not quite as well in regard to his wounds. Both have some slough noted on the surface of the wound. Unfortunately he is continuing to have some issues here with pain as well in the anterior portion which I think is an issue. Fortunately there is no signs of active infection at this time which is great news. No fevers, chills, nausea, vomiting, or diarrhea. Objective Constitutional Well-nourished and well-hydrated in no acute distress. Vitals Time Taken: 7:48 AM, Height: 66 in, Weight: 256 lbs, BMI: 41.3, Temperature: 97.9 F, Pulse: 71 bpm, Respiratory Rate: 18 breaths/min, Blood Pressure: 154/93 mmHg, Capillary Blood Glucose: 152 mg/dl. General Notes: glucose per pt report Respiratory normal breathing without difficulty. Psychiatric this patient is able to make decisions and demonstrates good insight into disease process. Alert and Oriented x 3. pleasant and cooperative. General Notes: Patient's wounds today are somewhat painful even with cleaning with saline gauze I think a lot of the slough is very adherent and I am not sure he would tolerate sharp debridement quite yet. I really think we need  to reinitiate the compression wrap followed by utilization of Iodoflex to try to help loosen up some of the necrotic tissue he is in agreement with that plan. Integumentary (Hair, Skin) Wound #1 status is Open. Original cause of wound was Trauma. The wound is located on the Right,Lateral Lower Leg. The wound measures 1.8cm length x 1.4cm width x 0.1cm depth; 1.979cm^2 area and 0.198cm^3 volume. There is Fat Layer (Subcutaneous Tissue) exposed. There is no tunneling or undermining noted. There is a medium amount of serosanguineous drainage noted. The wound margin is flat and intact. There is large (67-100%) red, pink granulation within the wound bed. There is a small (1-33%) amount of necrotic tissue within the wound bed including Adherent Slough. Wound #2 status is Open. Original cause of wound was Trauma. The wound is located on the Right,Anterior Lower Leg. The wound measures 2.8cm length x 2.4cm width x 0.1cm depth; 5.278cm^2 area and 0.528cm^3 volume. There is Fat Layer (Subcutaneous Tissue) exposed. There is no tunneling or undermining noted. There is a medium amount of serosanguineous drainage noted. The wound margin is flat and intact. There is small (1-33%) pink granulation within the wound bed. There is a large (67-100%) amount of necrotic tissue within the wound bed including Adherent Slough. 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 There was a Three Layer Compression Therapy Procedure by [name], RN. Post procedure Diagnosis Wound #1: Same as Pre-Procedure Plan Follow-up  Appointments: Return Appointment in 1 week. Dressing Change Frequency: Do not change entire dressing for one week. Skin Barriers/Peri-Wound Care: Moisturizing lotion - to leg daily Wound Cleansing: May shower with protection. Primary Wound Dressing: Wound #1 Right,Lateral Lower Leg: Iodoflex Wound #2 Right,Anterior Lower Leg: Iodoflex Secondary Dressing: Wound #1 Right,Lateral Lower Leg: Dry Gauze Wound #2 Right,Anterior Lower Leg: Dry Gauze Edema Control: Wound #1 Right,Lateral Lower Leg: 3 Layer Compression System - Right Lower Extremity Avoid standing for long periods of time Elevate legs to the level of the heart or above for 30 minutes daily and/or when sitting, a frequency of: - throughout the day. Exercise regularly 1. I would recommend at this point that we go ahead and continue with the wound care measures as mentioned above we will initiate treatment with Iodoflex at this point. 2. I am also can recommend that we go ahead and initiate treatment with the 3 layer compression wrap is had this before I think this can to do better to help control his edema which hopefully should help things progress and move along. 3. Also recommend the patient needs to elevate his legs much as possible to try to keep edema under control. We will see patient back for reevaluation in 1 week here in the clinic. If anything worsens or changes patient will contact our office for additional recommendations. Electronic Signature(s) Signed: 12/02/2019 9:26:26 AM By: Worthy Keeler PA-C Entered By: Worthy Keeler on 12/02/2019 09:26:26 -------------------------------------------------------------------------------- SuperBill Details Patient Name: Date of Service: [name], [name] 12/02/2019 Medical Record Number: [id number] Patient Account Number: [id number] Date of Birth/Sex: Treating RN: May 28, 1965 (54 y.o. [name] Primary Care Provider: Precious Haws Other Clinician: Referring  Provider: Treating Provider/Extender: [name], [name] in Treatment: 17 Diagnosis Coding ICD-10 Codes Code Description S80.811D Abrasion, right lower leg, subsequent encounter L97.812 Non-pressure chronic ulcer of other part of right lower leg with fat layer exposed E11.622 Type 2 diabetes mellitus with other skin ulcer I89.0 Lymphedema, not elsewhere classified I87.2 Venous insufficiency (chronic) (peripheral) Facility Procedures CPT4 Code: [id number] Description: (Facility Use Only) [phone] - [place] COMPRS LWR RT LEG Modifier: Quantity: 1 Physician Procedures : CPT4 Code Description Modifier [id number] - WC PHYS LEVEL 3 - EST PT ICD-10 Diagnosis Description S80.811D Abrasion, right lower leg, subsequent encounter L97.812 Non-pressure chronic ulcer of other part of right lower leg with fat layer exposed  E11.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

## 2019-12-05 NOTE — Progress Notes (Signed)
[name], [name] ([id number]) Visit Report for 11/25/2019 Arrival Information Details Patient Name: Date of Service: [name], [name] 11/25/2019 7:30 A M Medical Record Number: [id number] Patient Account Number: [id number] Date of Birth/Sex: Treating RN: 1965-07-16 (54 y.o. [name] Primary Care Kateri Balch: Precious Haws Other Clinician: Referring Skyelynn Rambeau: Treating Myrikal Messmer/Extender: Katheran Awe Weeks in Treatment: 16 Visit Information History Since Last Visit Added or deleted any medications: No Patient Arrived: Ambulatory Any new allergies or adverse reactions: No Arrival Time: 07:49 Had a fall or experienced change in No Accompanied By: alone activities of daily living that may affect Transfer Assistance: None risk of falls: Patient Identification Verified: Yes Signs or symptoms of abuse/neglect since last visito No Secondary Verification Process Completed: Yes Hospitalized since last visit: No Patient Requires Transmission-Based Precautions: No Implantable device outside of the clinic excluding No Patient Has Alerts: No cellular tissue based products placed in the center since last visit: Has Dressing in Place as Prescribed: Yes Pain Present Now: No Electronic Signature(s) Signed: 11/26/2019 5:36:57 PM By: Levan Hurst RN, BSN Entered By: Levan Hurst on 11/25/2019 07:49:35 -------------------------------------------------------------------------------- Clinic Level of Care Assessment Details Patient Name: Date of Service: [name], [name] 11/25/2019 7:30 A M Medical Record Number: [id number] Patient Account Number: [id number] Date of Birth/Sex: Treating RN: 1965-10-05 (54 y.o. [name] Primary Care Anjani Feuerborn: Precious Haws Other Clinician: Referring Ladye Macnaughton: Treating Amarii Amy/Extender: [name], [name] in Treatment: 16 Clinic Level of Care Assessment Items TOOL 4 Quantity Score _0  - 0 Use when only an EandM is performed on  FOLLOW-UP visit ASSESSMENTS - Nursing Assessment / Reassessment X- 1 10 Reassessment of [name]-morbidities (includes updates in patient status) X- 1 5 Reassessment of Adherence to Treatment Plan ASSESSMENTS - Wound and Skin A ssessment / Reassessment X - Simple Wound Assessment / Reassessment - one wound 1 5 _1  - 0 Complex Wound Assessment / Reassessment - multiple wounds _2  - 0 Dermatologic / Skin Assessment (not related to wound area) ASSESSMENTS - Focused Assessment X- 1 5 Circumferential Edema Measurements - multi extremities _3  - 0 Nutritional Assessment / Counseling / Intervention X- 1 5 Lower Extremity Assessment (monofilament, tuning fork, pulses) _4  - 0 Peripheral Arterial Disease Assessment (using hand held doppler) ASSESSMENTS - Ostomy and/or Continence Assessment and Care _5  - 0 Incontinence Assessment and Management _6  - 0 Ostomy Care Assessment and Management (repouching, etc.) PROCESS - Coordination of Care X - Simple Patient / Family Education for ongoing care 1 15 _7  - 0 Complex (extensive) Patient / Family Education for ongoing care X- 1 10 Staff obtains Programmer, systems, Records, T Results / Process Orders est _8  - 0 Staff telephones HHA, Nursing Homes / Clarify orders / etc _9  - 0 Routine Transfer to another Facility (non-emergent condition) _10  - 0 Routine Hospital Admission (non-emergent condition) _11  - 0 New Admissions / Biomedical engineer / Ordering NPWT Apligraf, etc. , _12  - 0 Emergency Hospital Admission (emergent condition) X- 1 10 Simple Discharge Coordination _13  - 0 Complex (extensive) Discharge Coordination PROCESS - Special Needs _14  - 0 Pediatric / Minor Patient Management _15  - 0 Isolation Patient Management _16  - 0 Hearing / Language / Visual special needs _17  - 0 Assessment of Community assistance (transportation, D/C planning, etc.) _18  - 0 Additional assistance / Altered mentation _19  - 0 Support Surface(s) Assessment (bed, cushion,  seat, etc.) INTERVENTIONS - Wound Cleansing / Measurement X - Simple Wound Cleansing - one wound 1 5 _20  - 0 Complex Wound Cleansing -  multiple wounds X- 1 5 Wound Imaging (photographs - any number of wounds) _0  - 0 Wound Tracing (instead of photographs) X- 1 5 Simple Wound Measurement - one wound _1  - 0 Complex Wound Measurement - multiple wounds INTERVENTIONS - Wound Dressings X - Small Wound Dressing one or multiple wounds 1 10 _2  - 0 Medium Wound Dressing one or multiple wounds _3  - 0 Large Wound Dressing one or multiple wounds X- 1 5 Application of Medications - topical <FUXNATFTDDUKGURK>_2<\/[id number]>_6  - 0 Application of Medications - injection INTERVENTIONS - Miscellaneous _5  - 0 External ear exam _6  - 0 Specimen Collection (cultures, biopsies, blood, body fluids, etc.) _7  - 0 Specimen(s) / Culture(s) sent or taken to Lab for analysis _8  - 0 Patient Transfer (multiple staff / Civil Service fast streamer / Similar devices) _9  - 0 Simple Staple / Suture removal (25 or less) _10  - 0 Complex Staple / Suture removal (26 or more) _11  - 0 Hypo / Hyperglycemic Management (close monitor of Blood Glucose) _12  - 0 Ankle / Brachial Index (ABI) - do not check if billed separately X- 1 5 Vital Signs Has the patient been seen at the hospital within the last three years: Yes Total Score: 100 Level Of Care: New/Established - Level 3 Electronic Signature(s) Signed: 11/25/2019 5:37:29 PM By: Baruch Gouty RN, BSN Entered By: Baruch Gouty on 11/25/2019 08:18:19 -------------------------------------------------------------------------------- Encounter Discharge Information Details Patient Name: Date of Service: [name], [name] 11/25/2019 7:30 A M Medical Record Number: [id number] Patient Account Number: [id number] Date of Birth/Sex: Treating RN: Feb 22, 1966 (53 y.o. [name] Primary Care Holdan Stucke: Precious Haws Other Clinician: Referring Kimetha Trulson: Treating Baldemar Dady/Extender: [name], [name] in  Treatment: 16 Encounter Discharge Information Items Discharge Condition: Stable Ambulatory Status: Ambulatory Discharge Destination: Home Transportation: Private Auto Accompanied By: self Schedule Follow-up Appointment: Yes Clinical Summary of Care: Patient Declined Electronic Signature(s) Signed: 12/04/2019 5:50:16 PM By: [name] RN Entered By: [name] on 11/25/2019 08:28:23 -------------------------------------------------------------------------------- Lower Extremity Assessment Details Patient Name: Date of Service: [name], [name] 11/25/2019 7:30 A M Medical Record Number: [id number] Patient Account Number: [id number] Date of Birth/Sex: Treating RN: 04-08-1966 (54 y.o. [name] Primary Care Gracelynne Benedict: Precious Haws Other Clinician: Referring Josphine Laffey: Treating Rashika Bettes/Extender: [name], [name] in Treatment: 16 Edema Assessment Assessed: [Left: No] [Right: No] Edema: [Left: Ye] [Right: s] Calf Left: Right: Point of Measurement: 31 cm From Medial Instep cm 40.5 cm Ankle Left: Right: Point of Measurement: 8 cm From Medial Instep cm 23.5 cm Vascular Assessment Pulses: Dorsalis Pedis Palpable: [Right:Yes] Electronic Signature(s) Signed: 11/26/2019 5:36:57 PM By: Levan Hurst RN, BSN Entered By: Levan Hurst on 11/25/2019 07:52:49 -------------------------------------------------------------------------------- [place] Details Patient Name: Date of Service: [name], [name] 11/25/2019 7:30 A M Medical Record Number: [id number] Patient Account Number: [id number] Date of Birth/Sex: Treating RN: 05/13/1965 (54 y.o. [name] Primary Care Adahlia Stembridge: Precious Haws Other Clinician: Referring Arva Slaugh: Treating Zelphia Glover/Extender: [name], [name] in Treatment: [phone] Active Inactive Venous Leg Ulcer Nursing Diagnoses: Knowledge deficit related to disease process and management Potential for venous  Insuffiency (use before diagnosis confirmed) Goals: Patient will maintain optimal edema control Date Initiated: 09/09/2019 Target Resolution Date: 12/02/2019 Goal Status: Active Patient/caregiver will verbalize understanding of disease process and disease management Date Initiated: 09/09/2019 Date Inactivated: 10/14/2019 Target Resolution Date: 10/07/2019 Goal Status: Met Interventions: Assess peripheral edema status every visit. Compression as ordered Provide education on venous insufficiency Treatment Activities: Therapeutic compression applied :  09/09/2019 Notes: Wound/Skin Impairment Nursing Diagnoses: Knowledge deficit related to ulceration/compromised skin integrity Goals: Patient/caregiver will verbalize understanding of skin care regimen Date Initiated: 08/04/2019 Target Resolution Date: 12/02/2019 Goal Status: Active Ulcer/skin breakdown will have a volume reduction of 30% by week 4 Date Initiated: 08/04/2019 Date Inactivated: 09/09/2019 Target Resolution Date: 09/11/2019 Goal Status: Met Ulcer/skin breakdown will have a volume reduction of 50% by week 8 Date Initiated: 09/09/2019 Date Inactivated: 10/14/2019 Target Resolution Date: 10/07/2019 Goal Status: Unmet Unmet Reason: awaiting reflux studies Ulcer/skin breakdown will have a volume reduction of 80% by week 12 Date Initiated: 10/14/2019 Date Inactivated: 11/18/2019 Target Resolution Date: 11/04/2019 Goal Status: Unmet Unmet Reason: uncontrolled htn Interventions: Assess patient/caregiver ability to obtain necessary supplies Assess patient/caregiver ability to perform ulcer/skin care regimen upon admission and as needed Assess ulceration(s) every visit Notes: Electronic Signature(s) Signed: 11/25/2019 5:37:29 PM By: Baruch Gouty RN, BSN Entered By: Baruch Gouty on 11/25/2019 08:03:02 -------------------------------------------------------------------------------- Pain Assessment Details Patient Name: Date of  Service: [name], [name] 11/25/2019 7:30 A M Medical Record Number: [id number] Patient Account Number: [id number] Date of Birth/Sex: Treating RN: [birth year] (54 y.o. [name] Primary Care Alaric Gladwin: Precious Haws Other Clinician: Referring Caroline Matters: Treating Nesreen Albano/Extender: [name], [name] in Treatment: 16 Active Problems Location of Pain Severity and Description of Pain Patient Has Paino No Site Locations Pain Management and Medication Current Pain Management: Electronic Signature(s) Signed: 11/26/2019 5:36:57 PM By: Levan Hurst RN, BSN Entered By: Levan Hurst on 11/25/2019 07:50:01 -------------------------------------------------------------------------------- Patient/Caregiver Education Details Patient Name: Date of Service: [name], [name] 8/18/2021andnbsp7:30 A M Medical Record Number: [id number] Patient Account Number: [id number] Date of Birth/Gender: Treating RN: Feb 02, 1966 (54 y.o. [name] Primary Care Physician: Precious Haws Other Clinician: Referring Physician: Treating Physician/Extender: Burnard Hawthorne in Treatment: 16 Education Assessment Education Provided To: Patient Education Topics Provided Venous: Methods: Explain/Verbal Responses: Reinforcements needed, State content correctly Electronic Signature(s) Signed: 11/25/2019 5:37:29 PM By: Baruch Gouty RN, BSN Entered By: Baruch Gouty on 11/25/2019 08:03:31 -------------------------------------------------------------------------------- Wound Assessment Details Patient Name: Date of Service: [name], [name] 11/25/2019 7:30 A M Medical Record Number: [id number] Patient Account Number: [id number] Date of Birth/Sex: Treating RN: 23-Jun-1965 (54 y.o. [name] Primary Care Lilah Mijangos: Precious Haws Other Clinician: Referring Jabaree Mercado: Treating Pura Picinich/Extender: [name], [name] in Treatment: 16 Wound Status Wound  Number: 1 Primary Diabetic Wound/Ulcer of the Lower Extremity Etiology: Wound Location: Right, Lateral Lower Leg Wound Open Wounding Event: Trauma Status: Date Acquired: 04/10/2019 Comorbid Lymphedema, Sleep Apnea, Arrhythmia, Congestive Heart Failure, Weeks Of Treatment: 16 History: Hypertension, Peripheral Arterial Disease, Type II Diabetes Clustered Wound: No Photos Photo Uploaded By: Mikeal Hawthorne on 11/26/2019 14:40:53 Wound Measurements Length: (cm) 1.9 Width: (cm) 1.4 Depth: (cm) 0.1 Area: (cm) 2.089 Volume: (cm) 0.209 % Reduction in Area: -141.8% % Reduction in Volume: -20.8% Epithelialization: Small (1-33%) Tunneling: No Undermining: No Wound Description Classification: Grade 2 Wound Margin: Flat and Intact Exudate Amount: Medium Exudate Type: Serosanguineous Exudate Color: red, brown Foul Odor After Cleansing: No Slough/Fibrino Yes Wound Bed Granulation Amount: Large (67-100%) Exposed Structure Granulation Quality: Red, Pink Fascia Exposed: No Necrotic Amount: Small (1-33%) Fat Layer (Subcutaneous Tissue) Exposed: Yes Necrotic Quality: Adherent Slough Tendon Exposed: No Muscle Exposed: No Joint Exposed: No Bone Exposed: No Electronic Signature(s) Signed: 11/26/2019 5:36:57 PM By: Levan Hurst RN, BSN Entered By: Levan Hurst on 11/25/2019 07:55:12 -------------------------------------------------------------------------------- Vitals Details Patient Name: Date of Service: [name], [name] 11/25/2019 7:30  A M Medical Record Number: [id number] Patient Account Number: [id number] Date of Birth/Sex: Treating RN: June 17, 1965 (54 y.o. [name] Primary Care Manie Bealer: Precious Haws Other Clinician: Referring Tarra Pence: Treating Yvette Roark/Extender: [name], [name] in Treatment: 16 Vital Signs Time Taken: 07:49 Temperature (F): 97.7 Height (in): 66 Pulse (bpm): 77 Weight (lbs): 256 Respiratory Rate (breaths/min): 18 Body Mass  Index (BMI): 41.3 Blood Pressure (mmHg): 127/83 Capillary Blood Glucose (mg/dl): 150 Reference Range: 80 - 120 mg / dl Notes glucose per pt report Electronic Signature(s) Signed: 11/26/2019 5:36:57 PM By: Levan Hurst RN, BSN Entered By: Levan Hurst on 11/25/2019 07:49:56

## 2019-12-09 ENCOUNTER — Encounter (HOSPITAL_BASED_OUTPATIENT_CLINIC_OR_DEPARTMENT_OTHER): Payer: Medicare Other | Attending: Physician Assistant | Admitting: Physician Assistant

## 2019-12-30 ENCOUNTER — Encounter (HOSPITAL_BASED_OUTPATIENT_CLINIC_OR_DEPARTMENT_OTHER): Payer: Medicare Other | Admitting: Physician Assistant

## 2019-12-30 ENCOUNTER — Other Ambulatory Visit: Payer: Self-pay

## 2019-12-30 ENCOUNTER — Emergency Department (HOSPITAL_COMMUNITY): Payer: Medicare Other

## 2019-12-30 ENCOUNTER — Encounter (HOSPITAL_COMMUNITY): Payer: Self-pay | Admitting: Emergency Medicine

## 2019-12-30 ENCOUNTER — Inpatient Hospital Stay (HOSPITAL_COMMUNITY)
Admission: EM | Admit: 2019-12-30 | Discharge: 2020-01-07 | DRG: 673 | Disposition: A | Discharge status: Home Health | Payer: Medicare Other | Attending: Internal Medicine | Admitting: Internal Medicine

## 2019-12-30 DIAGNOSIS — E1122 Type 2 diabetes mellitus with diabetic chronic kidney disease: Secondary | ICD-10-CM | POA: Diagnosis present

## 2019-12-30 DIAGNOSIS — N2581 Secondary hyperparathyroidism of renal origin: Secondary | ICD-10-CM | POA: Diagnosis present

## 2019-12-30 DIAGNOSIS — Z794 Long term (current) use of insulin: Secondary | ICD-10-CM

## 2019-12-30 DIAGNOSIS — E872 Acidosis: Secondary | ICD-10-CM | POA: Diagnosis not present

## 2019-12-30 DIAGNOSIS — I5033 Acute on chronic diastolic (congestive) heart failure: Secondary | ICD-10-CM | POA: Diagnosis present

## 2019-12-30 DIAGNOSIS — I480 Paroxysmal atrial fibrillation: Secondary | ICD-10-CM | POA: Diagnosis present

## 2019-12-30 DIAGNOSIS — Z79899 Other long term (current) drug therapy: Secondary | ICD-10-CM | POA: Diagnosis not present

## 2019-12-30 DIAGNOSIS — R55 Syncope and collapse: Secondary | ICD-10-CM

## 2019-12-30 DIAGNOSIS — J869 Pyothorax without fistula: Secondary | ICD-10-CM | POA: Diagnosis present

## 2019-12-30 DIAGNOSIS — Z7901 Long term (current) use of anticoagulants: Secondary | ICD-10-CM

## 2019-12-30 DIAGNOSIS — N183 Chronic kidney disease, stage 3 unspecified: Secondary | ICD-10-CM | POA: Insufficient documentation

## 2019-12-30 DIAGNOSIS — D631 Anemia in chronic kidney disease: Secondary | ICD-10-CM | POA: Diagnosis present

## 2019-12-30 DIAGNOSIS — L97912 Non-pressure chronic ulcer of unspecified part of right lower leg with fat layer exposed: Secondary | ICD-10-CM

## 2019-12-30 DIAGNOSIS — E1165 Type 2 diabetes mellitus with hyperglycemia: Secondary | ICD-10-CM | POA: Diagnosis present

## 2019-12-30 DIAGNOSIS — I132 Hypertensive heart and chronic kidney disease with heart failure and with stage 5 chronic kidney disease, or end stage renal disease: Secondary | ICD-10-CM | POA: Diagnosis present

## 2019-12-30 DIAGNOSIS — N1832 Chronic kidney disease, stage 3b: Secondary | ICD-10-CM

## 2019-12-30 DIAGNOSIS — E669 Obesity, unspecified: Secondary | ICD-10-CM | POA: Diagnosis present

## 2019-12-30 DIAGNOSIS — L97919 Non-pressure chronic ulcer of unspecified part of right lower leg with unspecified severity: Secondary | ICD-10-CM | POA: Diagnosis present

## 2019-12-30 DIAGNOSIS — Z20822 Contact with and (suspected) exposure to covid-19: Secondary | ICD-10-CM | POA: Diagnosis present

## 2019-12-30 DIAGNOSIS — G4733 Obstructive sleep apnea (adult) (pediatric): Secondary | ICD-10-CM | POA: Diagnosis present

## 2019-12-30 DIAGNOSIS — I5031 Acute diastolic (congestive) heart failure: Secondary | ICD-10-CM | POA: Diagnosis present

## 2019-12-30 DIAGNOSIS — Z6836 Body mass index (BMI) 36.0-36.9, adult: Secondary | ICD-10-CM

## 2019-12-30 DIAGNOSIS — I739 Peripheral vascular disease, unspecified: Secondary | ICD-10-CM | POA: Diagnosis not present

## 2019-12-30 DIAGNOSIS — N179 Acute kidney failure, unspecified: Secondary | ICD-10-CM | POA: Diagnosis present

## 2019-12-30 DIAGNOSIS — Z8249 Family history of ischemic heart disease and other diseases of the circulatory system: Secondary | ICD-10-CM

## 2019-12-30 DIAGNOSIS — N186 End stage renal disease: Secondary | ICD-10-CM

## 2019-12-30 DIAGNOSIS — I10 Essential (primary) hypertension: Secondary | ICD-10-CM | POA: Diagnosis present

## 2019-12-30 DIAGNOSIS — E1169 Type 2 diabetes mellitus with other specified complication: Secondary | ICD-10-CM

## 2019-12-30 DIAGNOSIS — I4891 Unspecified atrial fibrillation: Secondary | ICD-10-CM | POA: Diagnosis not present

## 2019-12-30 DIAGNOSIS — E119 Type 2 diabetes mellitus without complications: Secondary | ICD-10-CM

## 2019-12-30 DIAGNOSIS — N185 Chronic kidney disease, stage 5: Secondary | ICD-10-CM | POA: Diagnosis not present

## 2019-12-30 LAB — URINALYSIS, ROUTINE W REFLEX MICROSCOPIC
Bilirubin Urine: NEGATIVE
Glucose, UA: 50 mg/dL — AB
Hgb urine dipstick: NEGATIVE
Ketones, ur: NEGATIVE mg/dL
Leukocytes,Ua: NEGATIVE
Nitrite: NEGATIVE
Protein, ur: 100 mg/dL — AB
Specific Gravity, Urine: 1.011 (ref 1.005–1.030)
pH: 5 (ref 5.0–8.0)

## 2019-12-30 LAB — BASIC METABOLIC PANEL
Anion gap: 16 — ABNORMAL HIGH (ref 5–15)
BUN: 82 mg/dL — ABNORMAL HIGH (ref 6–20)
CO2: 16 mmol/L — ABNORMAL LOW (ref 22–32)
Calcium: 9.1 mg/dL (ref 8.9–10.3)
Chloride: 103 mmol/L (ref 98–111)
Creatinine, Ser: 10.43 mg/dL — ABNORMAL HIGH (ref 0.61–1.24)
GFR calc Af Amer: 6 mL/min — ABNORMAL LOW (ref >60)
GFR calc non Af Amer: 5 mL/min — ABNORMAL LOW (ref >60)
Glucose, Bld: 184 mg/dL — ABNORMAL HIGH (ref 70–99)
Potassium: 4.1 mmol/L (ref 3.5–5.1)
Sodium: 135 mmol/L (ref 135–145)

## 2019-12-30 LAB — I-STAT VENOUS BLOOD GAS, ED
Acid-base deficit: 7 mmol/L — ABNORMAL HIGH (ref 0.0–2.0)
Bicarbonate: 17.3 mmol/L — ABNORMAL LOW (ref 20.0–28.0)
Calcium, Ion: 1.14 mmol/L — ABNORMAL LOW (ref 1.15–1.40)
HCT: 29 % — ABNORMAL LOW (ref 39.0–52.0)
Hemoglobin: 9.9 g/dL — ABNORMAL LOW (ref 13.0–17.0)
O2 Saturation: 94 %
Potassium: 3.9 mmol/L (ref 3.5–5.1)
Sodium: 137 mmol/L (ref 135–145)
TCO2: 18 mmol/L — ABNORMAL LOW (ref 22–32)
pCO2, Ven: 28.9 mmHg — ABNORMAL LOW (ref 44.0–60.0)
pH, Ven: 7.385 (ref 7.250–7.430)
pO2, Ven: 70 mmHg — ABNORMAL HIGH (ref 32.0–45.0)

## 2019-12-30 LAB — CBC
HCT: 30.2 % — ABNORMAL LOW (ref 39.0–52.0)
HCT: 29.4 % — ABNORMAL LOW (ref 39.0–52.0)
Hemoglobin: 9.4 g/dL — ABNORMAL LOW (ref 13.0–17.0)
Hemoglobin: 9.3 g/dL — ABNORMAL LOW (ref 13.0–17.0)
MCH: 28.2 pg (ref 26.0–34.0)
MCH: 29.2 pg (ref 26.0–34.0)
MCHC: 31.1 g/dL (ref 30.0–36.0)
MCHC: 31.6 g/dL (ref 30.0–36.0)
MCV: 90.7 fL (ref 80.0–100.0)
MCV: 92.5 fL (ref 80.0–100.0)
Platelets: 440 10*3/uL — ABNORMAL HIGH (ref 150–400)
Platelets: 457 10*3/uL — ABNORMAL HIGH (ref 150–400)
RBC: 3.33 MIL/uL — ABNORMAL LOW (ref 4.22–5.81)
RBC: 3.18 MIL/uL — ABNORMAL LOW (ref 4.22–5.81)
RDW: 14.4 % (ref 11.5–15.5)
RDW: 14.4 % (ref 11.5–15.5)
WBC: 10.7 10*3/uL — ABNORMAL HIGH (ref 4.0–10.5)
WBC: 12.5 10*3/uL — ABNORMAL HIGH (ref 4.0–10.5)
nRBC: 0 % (ref 0.0–0.2)
nRBC: 0 % (ref 0.0–0.2)

## 2019-12-30 LAB — TROPONIN I (HIGH SENSITIVITY)
Troponin I (High Sensitivity): 52 ng/L — ABNORMAL HIGH (ref <18)
Troponin I (High Sensitivity): 43 ng/L — ABNORMAL HIGH (ref <18)

## 2019-12-30 LAB — CREATININE, SERUM
Creatinine, Ser: 10.49 mg/dL — ABNORMAL HIGH (ref 0.61–1.24)
GFR calc Af Amer: 6 mL/min — ABNORMAL LOW (ref >60)
GFR calc non Af Amer: 5 mL/min — ABNORMAL LOW (ref >60)

## 2019-12-30 LAB — HEMOGLOBIN A1C
Hgb A1c MFr Bld: 7.5 % — ABNORMAL HIGH (ref 4.8–5.6)
Mean Plasma Glucose: 168.55 mg/dL

## 2019-12-30 LAB — CBG MONITORING, ED: Glucose-Capillary: 153 mg/dL — ABNORMAL HIGH (ref 70–99)

## 2019-12-30 LAB — LACTIC ACID, PLASMA: Lactic Acid, Venous: 0.7 mmol/L (ref 0.5–1.9)

## 2019-12-30 LAB — SARS CORONAVIRUS 2 BY RT PCR (HOSPITAL ORDER, PERFORMED IN ~~LOC~~ HOSPITAL LAB): SARS Coronavirus 2: NEGATIVE

## 2019-12-30 MED ORDER — DOCUSATE SODIUM 283 MG RE ENEM
1.0000 | ENEMA | RECTAL | Status: DC | PRN
  Filled 2019-12-30: qty 1

## 2019-12-30 MED ORDER — MORPHINE SULFATE (PF) 2 MG/ML IV SOLN
2.0000 mg | Freq: Once | INTRAVENOUS | Status: AC
  Administered 2019-12-30: 2 mg via INTRAVENOUS
  Filled 2019-12-30: qty 1

## 2019-12-30 MED ORDER — ACETAMINOPHEN 650 MG RE SUPP: 650.0000 mg | Freq: Four times a day (QID) | RECTAL | Status: DC | PRN

## 2019-12-30 MED ORDER — INSULIN ASPART 100 UNIT/ML ~~LOC~~ SOLN
0.0000 [IU] | Freq: Every day | SUBCUTANEOUS | Status: DC
  Administered 2020-01-01: 3 [IU] via SUBCUTANEOUS
  Administered 2020-01-06: 5 [IU] via SUBCUTANEOUS

## 2019-12-30 MED ORDER — CAMPHOR-MENTHOL 0.5-0.5 % EX LOTN
1.0000 "application " | TOPICAL_LOTION | Freq: Three times a day (TID) | CUTANEOUS | Status: DC | PRN
  Filled 2019-12-30: qty 222

## 2019-12-30 MED ORDER — NEPRO/CARBSTEADY PO LIQD
237.0000 mL | Freq: Three times a day (TID) | ORAL | Status: DC | PRN
  Filled 2019-12-30: qty 237

## 2019-12-30 MED ORDER — HYDROXYZINE HCL 25 MG PO TABS: 25.0000 mg | ORAL_TABLET | Freq: Three times a day (TID) | ORAL | Status: DC | PRN

## 2019-12-30 MED ORDER — INSULIN ASPART 100 UNIT/ML ~~LOC~~ SOLN
0.0000 [IU] | Freq: Three times a day (TID) | SUBCUTANEOUS | Status: DC
  Administered 2019-12-31: 1 [IU] via SUBCUTANEOUS

## 2019-12-30 MED ORDER — HEPARIN SODIUM (PORCINE) 5000 UNIT/ML IJ SOLN
5000.0000 [IU] | Freq: Three times a day (TID) | INTRAMUSCULAR | Status: DC
  Administered 2019-12-30 – 2020-01-05 (×14): 5000 [IU] via SUBCUTANEOUS
  Filled 2019-12-30 (×15): qty 1

## 2019-12-30 MED ORDER — ONDANSETRON HCL 4 MG/2ML IJ SOLN: 4.0000 mg | Freq: Four times a day (QID) | INTRAMUSCULAR | Status: DC | PRN

## 2019-12-30 MED ORDER — CALCIUM CARBONATE ANTACID 1250 MG/5ML PO SUSP
500.0000 mg | Freq: Four times a day (QID) | ORAL | Status: DC | PRN
  Filled 2019-12-30: qty 5

## 2019-12-30 MED ORDER — SODIUM CHLORIDE 0.9 % IV BOLUS
500.0000 mL | Freq: Once | INTRAVENOUS | Status: AC
  Administered 2019-12-30: 500 mL via INTRAVENOUS

## 2019-12-30 MED ORDER — SORBITOL 70 % SOLN
30.0000 mL | Status: DC | PRN
  Filled 2019-12-30: qty 30

## 2019-12-30 MED ORDER — ZOLPIDEM TARTRATE 5 MG PO TABS: 5.0000 mg | ORAL_TABLET | Freq: Every evening | ORAL | Status: DC | PRN

## 2019-12-30 MED ORDER — ACETAMINOPHEN 325 MG PO TABS
650.0000 mg | ORAL_TABLET | Freq: Four times a day (QID) | ORAL | Status: DC | PRN
  Administered 2019-12-30 – 2020-01-06 (×12): 650 mg via ORAL
  Filled 2019-12-30 (×13): qty 2

## 2019-12-30 MED ORDER — ONDANSETRON HCL 4 MG PO TABS: 4.0000 mg | ORAL_TABLET | Freq: Four times a day (QID) | ORAL | Status: DC | PRN

## 2019-12-30 NOTE — Progress Notes (Addendum)
Weinman, COYLE ([id number]) Visit Report for 12/30/2019 Chief Complaint Document Details Patient Name: Date of Service: [name], [name] 12/30/2019 8:15 A M Medical Record Number: [id number] Patient Account Number: [id number] Date of Birth/Sex: Treating RN: 1966-03-09 (54 y.o. [name] Primary Care Provider: Precious Haws Other Clinician: Referring Provider: Treating Provider/Extender: [name], [name] in Treatment: 21 Information Obtained from: Patient Chief Complaint 08/04/2019; patient is here for review of the wound on the right lateral lower leg Electronic Signature(s) Signed: 12/30/2019 8:53:39 AM By: Worthy Keeler PA-C Entered By: Worthy Keeler on 12/30/2019 08:53:39 -------------------------------------------------------------------------------- HPI Details Patient Name: Date of Service: [name], [name] 12/30/2019 8:15 A M Medical Record Number: [id number] Patient Account Number: [id number] Date of Birth/Sex: Treating RN: [birth year] (54 y.o. [name] Primary Care Provider: Precious Haws Other Clinician: Referring Provider: Treating Provider/Extender: [name], [name] in Treatment: 21 History of Present Illness HPI Description: ADMISSION 08/04/2019 This is a 54 year old man who traumatized his right lateral lower leg while going up an escalator at the mall sometime in December 2020. He was referred to vascular surgery and on 04/14/2019 he saw Dr. Wenda Overland. He was felt to have "palpable pulses and normal noninvasive studies" although I cannot see these results. He was back in his primary doctor's office on 07/30/2019 for a nonhealing right lower extremity wound. He was referred to wound care I think in [facility] but his insurance was out of network. A CNS was ordered. X-ray was done that was negative. He has been using wound cleanser and a Band-Aid. Past medical history is actually quite extensive and includes chronic kidney  disease stage IV, chronic diastolic heart failure, atrial fibrillation, poorly controlled hypertension, poorly controlled diabetes with peripheral neuropathy and a recent hemoglobin A1c of 11.5, depression, lymphedema and sleep apnea ABI in our clinic was 1.1 in the right 5/6; this wound was initially traumatized while the patient was going down on an escalator. Arrived in clinic last week with a completely nonviable surface. He required mechanical debridement and we have been using Iodoflex under compression. Apparently the compression wraps fell down. He also has poorly controlled diabetes 5/15; traumatic/contusion wound to the right lateral lower leg. We have been using Iodoflex to clean at the surface of this which was completely nonviable along with mechanical debridements. He is under compression. 09/02/2019 upon evaluation today patient appears to be doing well with regard to his wound on his right lateral leg. He has been tolerating the dressing changes without complication. Fortunately there is no signs of active infection at this time. No fevers, chills, nausea, vomiting, or diarrhea. With that being said there is some necrotic tissue around the edges of the wound that is and require some sharp debridement today. That was discussed with the patient and his mother in the office today prior to debridement to which she did consent. 09/09/2019 upon evaluation today patient appears to be making progress in regard to his wound. Fortunately the wound is not nearly as deep as what it was. It is measuring a little bit larger due to having to clear some of the edges away but again that is necessary to get this to heal. Fortunately there is no evidence of active infection at this time. No fevers, chills, nausea, vomiting, or diarrhea. 09/16/2019 on evaluation today patient appears to be doing excellent in regard to his wound currently. In fact I do not see anything that we can need to debride  today which  is great news. The wound bed looks healthy and the edges of the wound look like they are doing great. Overall I feel like we are at a good place and headed in the proper direction. 09/23/2019 upon evaluation today patient appears to be doing okay in regard to his wound. This is not measuring any smaller it is about the same. With that being said he tells me that his wrap actually slid down he ended up having to take this off he was on a trip and did not have his Velcro compression with him. With that being said I do not see any signs of anything significantly worsening which is good news. 09/30/2019 upon evaluation today patient appears to be doing a little bit better in regard to his leg ulcer. He has been tolerating the dressing changes without complication. Fortunately there is no signs of active infection at this time. No fevers, chills, nausea, vomiting, or diarrhea. 10/14/2019 upon evaluation today patient's wound appears to be healthier. Overall but unfortunately is measuring a little bit bigger. With that being said I do think that we do need to still continue to wrap his leg. Fortunately there is no signs of active infection at this time which is great news. 11/18/2019 upon evaluation today patient actually appears to be doing decently well with regard to his wound. He has been tolerating the dressing changes without complication. Fortunately there is no signs of active infection has been using just Neosporin at this point since he was in the hospital. He did have a venous Doppler study in the hospital though I cannot see the results that was from November 04, 2019. Nonetheless overall I am pleased with the fact the patient does seem to be doing better. 11/25/2019 on evaluation today patient appears to be doing well with regard to his wounds. He has been tolerating the dressing changes without complication. Fortunately there is no signs of active infection at this time. No fevers, chills, nausea,  vomiting, or diarrhea. I do believe that he does not appear to be showing any signs of worsening although he also seems to be having some trouble getting this to proceed in a good fashion towards healing. 12/02/2019 on evaluation today patient actually appears to be doing not quite as well in regard to his wounds. Both have some slough noted on the surface of the wound. Unfortunately he is continuing to have some issues here with pain as well in the anterior portion which I think is an issue. Fortunately there is no signs of active infection at this time which is great news. No fevers, chills, nausea, vomiting, or diarrhea. 12/30/2019 upon evaluation today patient unfortunately has not been seen in the past month we are just now seeing him back for reevaluation today last time I saw him was August 25. At that point he had a lateral wound on his leg and an anterior wound which which is starting. The lateral has healed unfortunately the anterior looks worse. With that being said the patient unfortunately seems to be having some issues today that I cannot completely explain by just the way the wound appears. The wound itself is not really hot to touch and to be honest I would not necessarily assume infection upon initial inspection. With that being said he is acting very lethargic, was weak walking into the clinic according to his mother who is with him today, his blood pressure is actually in a normal range at around 110/73 which is  unusual for him he normally runs extremely high in the 916B for systolic. His blood glucose was 227 here in the clinic today respiratory rate was 18 and his temperature was 97.7. Pulse was right around 70 when I checked this manually. Nonetheless although his vital signs and normal circumstances would be reassuring I am still kind of concerned just because of the way he is acting. I did question him about medications that he has taken he told me that he took 2 Tylenol and  tramadol this morning he also tells me he took 2 tramadol last night. With that being said I do not believe that just taking the tramadol is accounting for what is going on as well based on what I see today. I discussed all this with his mother as well as the patient face-to-face in the office today and subsequently I think I would recommend further evaluation at the ER as I really feel like there is something going on here that I cannot identify just here in the clinic. Electronic Signature(s) Signed: 12/30/2019 9:12:21 AM By: Worthy Keeler PA-C Entered By: Worthy Keeler on 12/30/2019 09:12:21 -------------------------------------------------------------------------------- Physical Exam Details Patient Name: Date of Service: [name], [name] 12/30/2019 8:15 A M Medical Record Number: [id number] Patient Account Number: [id number] Date of Birth/Sex: Treating RN: [birth year] (54 y.o. [name] Primary Care Provider: Precious Haws Other Clinician: Referring Provider: Treating Provider/Extender: [name], [name] in Treatment: 21 Constitutional For him as his vital signs normally are significantly higher than what they are currently. His blood pressure on past evaluations runs in the 701-779 systolic and 39-030 diastolic over the past several visits. He has never been at the level he is today which is 110/73 and again though this is not a bad level as far as blood pressure reading is concerned I am concerned that for him this may be hypotensive.Marland Kitchen Respiratory normal breathing without difficulty. Cardiovascular 1+ pitting edema of the bilateral lower extremities. Musculoskeletal unsteady while walking. no significant deformity or arthritic changes, no loss or range of motion, no clubbing. Psychiatric this patient is able to make decisions and demonstrates good insight into disease process. Patient is oriented to person and place. Patient appears to be very drowsy and  lethargic he was also somewhat "wobbly" according to his mother walking into the clinic today.. Notes Upon inspection patient's wound bed actually showed signs of poor granulation in regard to the wound with that being said there is really not any evidence of obvious infection based on what I see as far as the surface of the wound and periwound is concerned there is no erythema or warmth which is good news. Again he could have a low-lying infection or something deeper but again superficially I do not see the evidence of this at the moment. Nonetheless the patient does seem to be very painful as far as touching in and around the wound area especially in the calf region which is somewhat abnormal at this point. Electronic Signature(s) Signed: 12/30/2019 9:16:02 AM By: Worthy Keeler PA-C Entered By: Worthy Keeler on 12/30/2019 09:16:01 -------------------------------------------------------------------------------- Physician Orders Details Patient Name: Date of Service: [name], [name] 12/30/2019 8:15 A M Medical Record Number: [id number] Patient Account Number: [id number] Date of Birth/Sex: Treating RN: 10-05-1965 (54 y.o. 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 Other: - Go to emergency room for evaluation of lethargy and malaise Dressing Change Frequency Change Dressing every other day. Skin Barriers/Peri-Wound Care Moisturizing lotion  - to leg daily Wound Cleansing May shower and wash wound with soap and water. Primary Wound Dressing Wound #2 Right,Anterior Lower Leg Hydrogel Mepitel or Adaptic Secondary Dressing Wound #1 Right,Lateral Lower Leg Dry Gauze Wound #2 Right,Anterior Lower Leg Kerlix/Rolled Gauze ABD pad Edema Control Wound #1 Right,Lateral Lower Leg Avoid standing for long periods of time Elevate legs to the level of the heart or above for 30 minutes daily and/or when sitting, a frequency of: - throughout the day. Exercise regularly Other: - ace wrap right leg Electronic Signature(s) Signed: 12/30/2019 5:11:27 PM By: Baruch Gouty RN, BSN Signed: 12/30/2019 5:16:27 PM By: Worthy Keeler PA-C Entered By: Baruch Gouty on 12/30/2019 09:08:29 -------------------------------------------------------------------------------- Problem List Details Patient Name: Date of Service: [name], [name] 12/30/2019 8:15 A M Medical Record Number: [id number] Patient Account Number: [id number] Date of Birth/Sex: Treating RN: 11-02-1965 (54 y.o. [name] Primary Care Provider: Precious Haws Other Clinician: Referring Provider: Treating Provider/Extender: Katheran Awe Weeks in Treatment: 21 Active Problems ICD-10 Encounter Code Description Active Date MDM Diagnosis S80.811D Abrasion, right lower leg, subsequent encounter 08/04/2019 No Yes L97.812 Non-pressure chronic ulcer of other part of right lower leg with fat layer 08/04/2019 No Yes exposed E11.622 Type 2 diabetes mellitus with other skin ulcer 08/04/2019 No Yes I89.0 Lymphedema, not elsewhere classified 08/04/2019 No Yes I87.2 Venous insufficiency (chronic) (peripheral) 09/30/2019 No Yes Inactive Problems Resolved Problems Electronic Signature(s) Signed: 12/30/2019 8:53:33 AM By: Worthy Keeler PA-C Entered By: Worthy Keeler on 12/30/2019  08:53:33 -------------------------------------------------------------------------------- Progress Note Details Patient Name: Date of Service: [name], [name] 12/30/2019 8:15 A M Medical Record Number: [id number] Patient Account Number: [id number] Date of Birth/Sex: Treating RN: 1965/11/21 (54 y.o. [name] Primary Care Provider: Precious Haws Other Clinician: Referring Provider: Treating Provider/Extender: [name], [name] in Treatment: 21 Subjective Chief Complaint Information obtained from Patient 08/04/2019; patient is here for review of the wound on the right lateral lower leg History of Present Illness (HPI) ADMISSION 08/04/2019 This is a 54 year old man who traumatized his right lateral lower leg while going [place] an escalator at the mall sometime in December 2020. He was referred to vascular surgery and on 04/14/2019 he saw Dr. Wenda Overland. He was felt to have "palpable pulses and normal noninvasive studies" although I cannot see these results. He was back in his primary doctor's office on 07/30/2019 for a nonhealing right lower extremity wound. He was referred to wound care I think in [facility] but his insurance was out of network. A CNS was ordered. X-ray was done that was negative. He has been using wound cleanser and a Band-Aid. Past medical history is actually quite extensive and includes chronic kidney disease stage IV, chronic diastolic heart failure, atrial fibrillation, poorly controlled hypertension, poorly controlled diabetes with peripheral neuropathy and a recent hemoglobin A1c of 11.5, depression, lymphedema and sleep apnea ABI in our clinic was 1.1 in the right 5/6; this wound was initially traumatized while the patient was going down on an escalator.  Arrived in clinic last week with a completely nonviable surface. He required mechanical debridement and we have been using Iodoflex under compression. Apparently the compression wraps fell down. He also  has poorly controlled diabetes 5/15; traumatic/contusion wound to the right lateral lower leg. We have been using Iodoflex to clean at the surface of this which was completely nonviable along with mechanical debridements. He is under compression. 09/02/2019 upon evaluation today patient appears to be doing well with regard to his wound on his right lateral leg. He has been tolerating the dressing changes without complication. Fortunately there is no signs of active infection at this time. No fevers, chills, nausea, vomiting, or diarrhea. With that being said there is some necrotic tissue around the edges of the wound that is and require some sharp debridement today. That was discussed with the patient and his mother in the office today prior to debridement to which she did consent. 09/09/2019 upon evaluation today patient appears to be making progress in regard to his wound. Fortunately the wound is not nearly as deep as what it was. It is measuring a little bit larger due to having to clear some of the edges away but again that is necessary to get this to heal. Fortunately there is no evidence of active infection at this time. No fevers, chills, nausea, vomiting, or diarrhea. 09/16/2019 on evaluation today patient appears to be doing excellent in regard to his wound currently. In fact I do not see anything that we can need to debride today which is great news. The wound bed looks healthy and the edges of the wound look like they are doing great. Overall I feel like we are at a good place and headed in the proper direction. 09/23/2019 upon evaluation today patient appears to be doing okay in regard to his wound. This is not measuring any smaller it is about the same. With that being said he tells me that his wrap actually slid down he ended up having to take this off he was on a trip and did not have his Velcro compression with him. With that being said I do not see any signs of anything significantly  worsening which is good news. 09/30/2019 upon evaluation today patient appears to be doing a little bit better in regard to his leg ulcer. He has been tolerating the dressing changes without complication. Fortunately there is no signs of active infection at this time. No fevers, chills, nausea, vomiting, or diarrhea. 10/14/2019 upon evaluation today patient's wound appears to be healthier. Overall but unfortunately is measuring a little bit bigger. With that being said I do think that we do need to still continue to wrap his leg. Fortunately there is no signs of active infection at this time which is great news. 11/18/2019 upon evaluation today patient actually appears to be doing decently well with regard to his wound. He has been tolerating the dressing changes without complication. Fortunately there is no signs of active infection has been using just Neosporin at this point since he was in the hospital. He did have a venous Doppler study in the hospital though I cannot see the results that was from November 04, 2019. Nonetheless overall I am pleased with the fact the patient does seem to be doing better. 11/25/2019 on evaluation today patient appears to be doing well with regard to his wounds. He has been tolerating the dressing changes without complication. Fortunately there is no signs of active infection at this time. No fevers, chills,  nausea, vomiting, or diarrhea. I do believe that he does not appear to be showing any signs of worsening although he also seems to be having some trouble getting this to proceed in a good fashion towards healing. 12/02/2019 on evaluation today patient actually appears to be doing not quite as well in regard to his wounds. Both have some slough noted on the surface of the wound. Unfortunately he is continuing to have some issues here with pain as well in the anterior portion which I think is an issue. Fortunately there is no signs of active infection at this time which is  great news. No fevers, chills, nausea, vomiting, or diarrhea. 12/30/2019 upon evaluation today patient unfortunately has not been seen in the past month we are just now seeing him back for reevaluation today last time I saw him was August 25. At that point he had a lateral wound on his leg and an anterior wound which which is starting. The lateral has healed unfortunately the anterior looks worse. With that being said the patient unfortunately seems to be having some issues today that I cannot completely explain by just the way the wound appears. The wound itself is not really hot to touch and to be honest I would not necessarily assume infection upon initial inspection. With that being said he is acting very lethargic, was weak walking into the clinic according to his mother who is with him today, his blood pressure is actually in a normal range at around 110/73 which is unusual for him he normally runs extremely high in the 952W for systolic. His blood glucose was 227 here in the clinic today respiratory rate was 18 and his temperature was 97.7. Pulse was right around 70 when I checked this manually. Nonetheless although his vital signs and normal circumstances would be reassuring I am still kind of concerned just because of the way he is acting. I did question him about medications that he has taken he told me that he took 2 Tylenol and tramadol this morning he also tells me he took 2 tramadol last night. With that being said I do not believe that just taking the tramadol is accounting for what is going on as well based on what I see today. I discussed all this with his mother as well as the patient face-to-face in the office today and subsequently I think I would recommend further evaluation at the ER as I really feel like there is something going on here that I cannot identify just here in the clinic. Objective Constitutional For him as his vital signs normally are significantly higher than what  they are currently. His blood pressure on past evaluations runs in the 413-244 systolic and 01-027 diastolic over the past several visits. He has never been at the level he is today which is 110/73 and again though this is not a bad level as far as blood pressure reading is concerned I am concerned that for him this may be hypotensive.. Vitals Time Taken: 8:36 AM, Height: 66 in, Weight: 256 lbs, BMI: 41.3, Temperature: 97.7 F, Pulse: 78 bpm, Respiratory Rate: 18 breaths/min, Blood Pressure: 110/73 mmHg, Capillary Blood Glucose: 227 mg/dl. Respiratory normal breathing without difficulty. Cardiovascular 1+ pitting edema of the bilateral lower extremities. Musculoskeletal unsteady while walking. no significant deformity or arthritic changes, no loss or range of motion, no clubbing. Psychiatric this patient is able to make decisions and demonstrates good insight into disease process. Patient is oriented to person and place. Patient  appears to be very drowsy and lethargic he was also somewhat "wobbly" according to his mother walking into the clinic today.. General Notes: Upon inspection patient's wound bed actually showed signs of poor granulation in regard to the wound with that being said there is really not any evidence of obvious infection based on what I see as far as the surface of the wound and periwound is concerned there is no erythema or warmth which is good news. Again he could have a low-lying infection or something deeper but again superficially I do not see the evidence of this at the moment. Nonetheless the patient does seem to be very painful as far as touching in and around the wound area especially in the calf region which is somewhat abnormal at this point. Integumentary (Hair, Skin) Wound #1 status is Open. Original cause of wound was Trauma. The wound is located on the Right,Lateral Lower Leg. The wound measures 0cm length x 0cm width x 0cm depth; 0cm^2 area and 0cm^3 volume.  There is Fat Layer (Subcutaneous Tissue) exposed. There is no tunneling or undermining noted. There is a none present amount of drainage noted. The wound margin is flat and intact. There is no granulation within the wound bed. There is no necrotic tissue within the wound bed. Wound #2 status is Open. Original cause of wound was Trauma. The wound is located on the Right,Anterior Lower Leg. The wound measures 8.6cm length x 4cm width x 0.1cm depth; 27.018cm^2 area and 2.702cm^3 volume. There is Fat Layer (Subcutaneous Tissue) exposed. There is no tunneling or undermining noted. There is a medium amount of serosanguineous drainage noted. The wound margin is flat and intact. There is small (1-33%) pink granulation within the wound bed. There is a large (67-100%) amount of necrotic tissue within the wound bed including Adherent Slough. 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 Other: - Go to emergency room for evaluation of lethargy and malaise Dressing Change Frequency: Change Dressing every other day. Skin Barriers/Peri-Wound Care: Moisturizing lotion - to leg daily Wound Cleansing: May shower and wash wound with soap and water. Primary Wound Dressing: Wound #2 Right,Anterior Lower Leg: Hydrogel Mepitel or Adaptic Secondary Dressing: Wound #1 Right,Lateral Lower Leg: Dry Gauze Wound #2 Right,Anterior Lower Leg: Kerlix/Rolled Gauze ABD pad Edema Control: Wound #1 Right,Lateral Lower Leg: Avoid standing for long periods of time Elevate legs to the level of the heart or above for 30 minutes daily and/or when sitting, a frequency of: - throughout the day. Exercise  regularly Other: - ace wrap right leg 1. Based on what I am seeing at this point I cannot confirm that the patient is actually experiencing any issues currently with sepsis or even an infection in regard to his right leg though he is having significant pain. Nonetheless he does not seem to be acting normally upon evaluation today and his mother agrees. 2. I did question him about any medications he had taken he has taken he tells me to Tylenol 1 tramadol this morning also having taken 2 Tylenol 1 tramadol last night. With that being said he denies having taken any other medications or consumed anything else that would potentially impair his ability to discuss with me here in the clinic today nonetheless he seems to  be very weak with walking and very somnolent in general. This has been concerned especially when coupled with his low blood pressure for him. 3. For him as his vital signs normally are significantly higher than what they are currently. His blood pressure on past evaluations runs in the 510-258 systolic and 52-778 diastolic over the past several visits. He has never been at the level he is today which is 110/73 and again though this is not a bad level as far as blood pressure reading is concerned I am concerned that for him this may be hypotensive. 4. Based on everything I am seeing at this point my suggestion would be that I have the patient go to the ER for further evaluation and treatment to ensure that everything checks out okay I do not want to miss anything in this regard patient's mother is in agreement with that plan. We will see patient back for reevaluation in 1 week here in the clinic. If anything worsens or changes patient will contact our office for additional recommendations. Electronic Signature(s) Signed: 12/30/2019 9:17:53 AM By: Worthy Keeler PA-C Entered By: Worthy Keeler on 12/30/2019  09:17:53 -------------------------------------------------------------------------------- SuperBill Details Patient Name: Date of Service: [name], [name] 12/30/2019 Medical Record Number: [id number] Patient Account Number: [id number] Date of Birth/Sex: Treating RN: [birth year] (54 y.o. [name] Primary Care Provider: Precious Haws Other Clinician: Referring Provider: Treating Provider/Extender: [name], [name] in Treatment: 21 Diagnosis Coding ICD-10 Codes Code Description S80.811D Abrasion, right lower leg, subsequent encounter L97.812 Non-pressure chronic ulcer of other part of right lower leg with fat layer exposed E11.622 Type 2 diabetes mellitus with other skin ulcer I89.0 Lymphedema, not elsewhere classified I87.2 Venous insufficiency (chronic) (peripheral) Facility Procedures CPT4 Code: [id number] Description: 99214 - WOUND CARE VISIT-LEV 4 EST PT Modifier: Quantity: 1 Physician Procedures : CPT4 Code Description Modifier [id number] 99214 - WC PHYS LEVEL 4 - EST PT ICD-10 Diagnosis Description S80.811D Abrasion, right lower leg, subsequent encounter L97.812 Non-pressure chronic ulcer of other part of right lower leg with fat layer exposed  E11.622 Type 2 diabetes mellitus with other skin ulcer I89.0 Lymphedema, not elsewhere classified Quantity: 1 Electronic Signature(s) Signed: 12/30/2019 9:18:04 AM By: Worthy Keeler PA-C Entered By: Worthy Keeler on 12/30/2019 09:18:03

## 2019-12-30 NOTE — ED Notes (Signed)
Pt transported to Xray. 

## 2019-12-30 NOTE — ED Notes (Signed)
Dinner tray ordered.

## 2019-12-30 NOTE — ED Provider Notes (Signed)
[facility] EMERGENCY DEPARTMENT Provider Note   CSN: [id number] Arrival date & time: 12/30/19  1013     History Chief Complaint  Patient presents with  . Chest Pain    [name] is a 54 y.o. male proximal history of CKD, diabetes, hypertension, encephalopathy who presents from wound care center for evaluation of generalized weakness, chest pain, shortness of breath.  He was on the wound care today for routine appointment regarding the wound on his right leg which he has been dealing with for the last 7 months.  He states that while he was at the wound care center, he had an episode of generalized weakness and feeling like he had no energy.  He he told EMS he was having chest pain and shortness of breath.  When I discussed this with him, he says it was more like a weakness over his entire chest and body.  He currently denies any chest pain or difficulty breathing but still feels like he is very weak and fatigued.  He states he is having pain in his right leg which has been an ongoing issue for several months. He also reports a headache since his symptoms today at Wound care.  Mom also reports that he was slightly confused and his blood pressure was in the 117's which is abnormal for him so wound care called EMS.  Patient does have a history of CKD and follows with Dr. Rockne Menghini for nephrology.  He was told that if his numbers were not improving, he may have to go to dialysis.  The wound care center sent him to [facility] in case he needed emergent dialysis.  Patient states his pain is primarily in his right leg.  He feels like it has gotten bigger and the area around it has gotten more red, more painful.  He has noticed some minor drainage at home.  He has not noted any fevers.  He has not had any cough, abdominal pain, nausea/vomiting.  He has gotten vaccinated for Covid.  The history is provided by the patient.       Past Medical History:  Diagnosis Date  . Acute kidney injury  ([place]) 12/28/2014  . Adjustment disorder with mixed anxiety and depressed mood 12/28/2014  . CPAP (continuous positive airway pressure) dependence   . Diabetes mellitus without complication ([place])   . DM (diabetes mellitus) type II controlled with renal manifestation ([place]) 12/28/2014  . DM (diabetes mellitus) type II controlled, neurological manifestation ([place]) 12/28/2014  . Encephalopathy, hypertensive 12/28/2014  . Hypertension   . Hypertensive emergency without congestive heart failure 12/27/2014  . Possible Panic disorder 12/28/2014  . Renal disorder   . 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 Sleep apnea     Patient Active Problem List   Diagnosis Date Noted  . Acute renal failure superimposed on stage 3 chronic kidney disease ([place]) 12/30/2019  . Hypertensive urgency 10/25/2019  . Chronic ulcer of right leg ([place]) 10/25/2019  . Right otitis media with effusion 10/25/2019  . AF (paroxysmal atrial fibrillation) ([place]) 10/25/2019  . AKI (acute kidney injury) ([place]) 07/28/2017  . Right sided Flank pain 07/27/2017  . Weakness 07/27/2017  . Myalgia 07/27/2017  . Pneumonia of right lung due to infectious organism   . Pleural effusion   . Uncontrolled hypertension   . Post-operative pain   . Diabetes mellitus type 2 in nonobese (HCC)   . Acute diastolic congestive heart failure ([place])   . Encephalopathy   . Stage 4 chronic  kidney disease ([place])   . SIRS (systemic inflammatory response syndrome) (H[facility])   . Chest tube in place   . 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[place])   . Postoperative pain   . Pleural effusion on right 05/17/2017  . Empyema ([place]) 05/16/2017  . Acute kidney injury ([place]) 12/28/2014  . Type 2 diabetes mellitus with hyperglycemia, with long-term current use of insulin ([place]) 12/28/2014  . DM (diabetes mellitus) type II controlled, neurological manifestation ([place]) 12/28/2014  . Adjustment disorder with mixed anxiety and depressed mood 12/28/2014  . Possible Panic disorder 12/28/2014  . Hypertensive  emergency without congestive heart failure 12/27/2014  . Hyponatremia 06/27/2013  . Essential hypertension, malignant 06/27/2013  . Hyperglycemia 06/26/2013  . DKA (diabetic ketoacidoses) ([place]) 06/26/2013  . Severe uncontrolled hypertension 03/31/2011  . Hypokalemia 03/28/2011  . Essential hypertension 03/28/2011  . CKD (chronic kidney disease), stage V ([place]) 03/28/2011    Past Surgical History:  Procedure Laterality Date  . EMPYEMA DRAINAGE Right 05/17/2017   Procedure: EMPYEMA DRAINAGE AND DECORTICATION;  Surgeon: Grace Isaac, MD;  Location: [facility];  Service: Thoracic;  Laterality: Right;  . FLEXIBLE BRONCHOSCOPY  05/17/2017   Procedure: FLEXIBLE BRONCHOSCOPY;  Surgeon: Grace Isaac, MD;  Location: [facility];  Service: Thoracic;;  . IR THORACENTESIS ASP PLEURAL SPACE W/IMG GUIDE  05/17/2017  . NO PAST SURGERIES    . THORACOTOMY/LOBECTOMY Right 05/17/2017   Procedure: RIGHT MINI THORACOTOMY;  Surgeon: Grace Isaac, MD;  Location: [facility];  Service: Thoracic;  Laterality: Right;  Marland Kitchen VIDEO ASSISTED THORACOSCOPY (VATS)/EMPYEMA Right 05/17/2017   Procedure: RIGHT VIDEO ASSISTED THORACOSCOPY (VATS)/EMPYEMA;  Surgeon: Grace Isaac, MD;  Location: [facility];  Service: Thoracic;  Laterality: Right;       Family History  Problem Relation Age of Onset  . Hypertension Mother     Social History   Tobacco Use  . Smoking status: Never Smoker  . Smokeless tobacco: Never Used  Vaping Use  . Vaping Use: Never used  Substance Use Topics  . Alcohol use: No  . Drug use: No    Home Medications Prior to Admission medications   Medication Sig Start Date End Date Taking? Authorizing Provider  acetaminophen (TYLENOL) 500 MG tablet Take 1,000-1,500 mg by mouth 2 (two) times daily as needed (pain).    [provider]  albuterol (PROVENTIL HFA;VENTOLIN HFA) 108 (90 BASE) MCG/ACT inhaler Inhale 2 puffs into the lungs every 6 (six) hours as needed for wheezing or shortness of breath.      [provider]  amLODipine (NORVASC) 10 MG tablet Take 1 tablet (10 mg total) by mouth daily. 12/29/14   Smiley Houseman, MD  amoxicillin-clavulanate (AUGMENTIN) 250-125 MG tablet Take 1 tablet by mouth 2 (two) times daily. 10/26/19   Georgette Shell, MD  apixaban (ELIQUIS) 5 MG TABS tablet Take 5 mg by mouth 2 (two) times daily.    [provider]  cloNIDine (CATAPRES - DOSED IN MG/24 HR) 0.2 mg/24hr patch Place 1 patch (0.2 mg total) onto the skin once a week. Patient taking differently: Place 0.2 mg onto the skin every Saturday.  05/22/17   Bufford Lope, DO  fluticasone (FLONASE) 50 MCG/ACT nasal spray Place 1 spray into both nostrils 2 (two) times daily. 10/06/19   [provider]  furosemide (LASIX) 80 MG tablet Take 120 mg by mouth daily.    [provider]  gabapentin (NEURONTIN) 300 MG capsule Take 1 capsule (300 mg total) by mouth daily. 10/28/19  Georgette Shell, MD  hydrALAZINE (APRESOLINE) 100 MG tablet Take 1 tablet (100 mg total) by mouth 4 (four) times daily. 10/26/19   Georgette Shell, MD  HYDROcodone-acetaminophen (NORCO) 7.5-325 MG tablet Take 1 tablet by mouth every 4 (four) hours as needed. 12/11/19   [provider]  insulin NPH-regular Human (70-30) 100 UNIT/ML injection Inject 35 Units into the skin 2 (two) times daily before a meal.    [provider]  labetalol (NORMODYNE) 200 MG tablet Take 200 mg by mouth 2 (two) times daily.  12/14/15   [provider]  levocetirizine (XYZAL) 5 MG tablet Take 5 mg by mouth at bedtime. 10/06/19   [provider]  Multiple Vitamin (MULTIVITAMIN WITH MINERALS) TABS tablet Take 1 tablet by mouth daily. Centrum    [provider]  potassium chloride SA (K-DUR,KLOR-CON) 20 MEQ tablet Take 20 mEq by mouth every morning.     [provider]  tiZANidine (ZANAFLEX) 4 MG tablet Take 4 mg by mouth every 8 (eight) hours as needed for muscle spasms.   04/08/17   [provider]  traMADol (ULTRAM) 50 MG tablet Take 50 mg by mouth 3 (three) times daily as needed (pain).  08/17/19   [provider]  Vitamin D, Ergocalciferol, (DRISDOL) 1.25 MG (50000 UNIT) CAPS capsule Take 50,000 Units by mouth every Sunday. 08/17/19   [provider]    Allergies    Patient has no known allergies.  Review of Systems   Review of Systems  Constitutional: Negative for fever.  Respiratory: Negative for cough and shortness of breath.   Cardiovascular: Negative for chest pain.  Gastrointestinal: Negative for abdominal pain, nausea and vomiting.  Genitourinary: Negative for dysuria and hematuria.  Skin: Positive for color change and wound.  Neurological: Positive for weakness (generalized) and headaches.  All other systems reviewed and are negative.   Physical Exam Updated Vital Signs BP (!) 185/103 (BP Location: Right Arm)   Pulse 93   Temp 97.7 F (36.5 C) (Oral)   Resp 19   Ht _0  (1.702 m)   Wt 109.8 kg   SpO2 100%   BMI 37.90 kg/m   Physical Exam Vitals and nursing note reviewed.  Constitutional:      Appearance: Normal appearance. He is well-developed.  HENT:     Head: Normocephalic and atraumatic.  Eyes:     General: Lids are normal.     Conjunctiva/sclera: Conjunctivae normal.     Pupils: Pupils are equal, round, and reactive to light.  Cardiovascular:     Rate and Rhythm: Normal rate and regular rhythm.     Pulses: Normal pulses.          Radial pulses are 2+ on the right side and 2+ on the left side.       Dorsalis pedis pulses are 2+ on the right side and 2+ on the left side.     Heart sounds: Normal heart sounds. No murmur heard.  No friction rub. No gallop.   Pulmonary:     Effort: Pulmonary effort is normal.     Breath sounds: Normal breath sounds.     Comments: Lungs clear to auscultation bilaterally.  Symmetric chest rise.  No wheezing, rales, rhonchi. Abdominal:     Palpations: Abdomen is  soft. Abdomen is not rigid.     Tenderness: There is no abdominal tenderness. There is no guarding.     Comments: Abdomen is soft, non-distended, non-tender. No rigidity, No guarding.  No peritoneal signs.  Musculoskeletal:        General: Normal range of motion.     Cervical back: Full passive range of motion without pain.     Comments: Tenderness to palpation noted to the anterior aspect of the right tib-fib with some mild overlying soft tissue swelling and erythema.  There is about a 5 x 3 cm wound noted to the anterior aspect.  No active drainage.  There is granulation tissue noted.  Skin:    General: Skin is warm and dry.     Capillary Refill: Capillary refill takes less than 2 seconds.     Comments: Good distal cap refill. RLE is not dusky in appearance or cool to touch.\  Neurological:     Mental Status: He is alert and oriented to person, place, and time.     Comments: Cranial nerves III-XII intact Follows commands, Moves all extremities  5/5 strength to BUE and BLE  Sensation intact throughout all major nerve distributions No slurred speech. No facial droop.  Psychiatric:        Speech: Speech normal.       ED Results / Procedures / Treatments   Labs (all labs ordered are listed, but only abnormal results are displayed) Labs Reviewed  BASIC METABOLIC PANEL - Abnormal; Notable for the following components:      Result Value   CO2 16 (*)    Glucose, Bld 184 (*)    BUN 82 (*)    Creatinine, Ser 10.43 (*)    GFR calc non Af Amer 5 (*)    GFR calc Af Amer 6 (*)    Anion gap 16 (*)    All other components within normal limits  CBC - Abnormal; Notable for the following components:   WBC 10.7 (*)    RBC 3.33 (*)    Hemoglobin 9.4 (*)    HCT 30.2 (*)    Platelets 440 (*)    All other components within normal limits  URINALYSIS, ROUTINE W REFLEX MICROSCOPIC - Abnormal; Notable for the following components:   Color, Urine STRAW (*)    Glucose, UA 50 (*)    Protein, ur  100 (*)    Bacteria, UA RARE (*)    All other components within normal limits  I-STAT VENOUS BLOOD GAS, ED - Abnormal; Notable for the following components:   pCO2, Ven 28.9 (*)    pO2, Ven 70.0 (*)    Bicarbonate 17.3 (*)    TCO2 18 (*)    Acid-base deficit 7.0 (*)    Calcium, Ion 1.14 (*)    HCT 29.0 (*)    Hemoglobin 9.9 (*)    All other components within normal limits  TROPONIN I (HIGH SENSITIVITY) - Abnormal; Notable for the following components:   Troponin I (High Sensitivity) 52 (*)    All other components within normal limits  TROPONIN I (HIGH SENSITIVITY) - Abnormal; Notable for the following components:   Troponin I (High Sensitivity) 43 (*)    All other components within normal limits  SARS CORONAVIRUS 2 BY RT PCR (HOSPITAL ORDER, [facility])  LACTIC ACID, PLASMA    EKG EKG Interpretation  Date/Time:  Wednesday December 30 2019 10:15:03 EDT Ventricular Rate:  78 PR Interval:  180 QRS Duration: 86 QT Interval:  440 QTC Calculation: 501 R Axis:   -23 Text Interpretation: Normal sinus rhythm Prolonged QT Abnormal ECG No sig change from prior ecg, no STEMI Confirmed by Trifan,  Rodman Key [phone]) on 12/30/2019 2:11:19 PM   Radiology DG Chest 2 View  Result Date: 12/30/2019 CLINICAL DATA:  Chest pain, right leg swelling EXAM: CHEST - 2 VIEW COMPARISON:  10/25/2019 FINDINGS: Cardiomegaly. No confluent opacities, effusions or edema. No acute bony abnormality. IMPRESSION: Cardiomegaly.  No active disease. Electronically Signed   By: Rolm Baptise M.D.   On: 12/30/2019 10:46   DG Tibia/Fibula Right  Result Date: 12/30/2019 CLINICAL DATA:  Leg wound pain, anterior X 7 months. EXAM: RIGHT TIBIA AND FIBULA - 2 VIEW COMPARISON:  X-ray right tibia fibula 10/25/2019 FINDINGS: There is no evidence of fracture or aggressive appearing focal bone lesions. No cortical erosion or destruction. Similar appearing cortical irregularity of the distal posterior tibial  shaft and malleolus likely representing an old healed fracture or possible sessile exostosis. Soft tissues are unremarkable. IMPRESSION: 1. No acute fracture or dislocation of the bones of the right tibia fibula. 2. No radiographic evidence of osteomyelitis. Electronically Signed   By: Iven Finn M.D.   On: 12/30/2019 15:55   CT Head Wo Contrast  Result Date: 12/30/2019 CLINICAL DATA:  Headache new or worsening. Confusion, slurred speech EXAM: CT HEAD WITHOUT CONTRAST TECHNIQUE: Contiguous axial images were obtained from the base of the skull through the vertex without intravenous contrast. COMPARISON:  CT head 10/25/2019 FINDINGS: Brain: No evidence of acute infarction, hemorrhage, hydrocephalus, extra-axial collection or mass lesion/mass effect. Vascular: Negative for hyperdense vessel Skull: Negative Sinuses/Orbits: Retention cyst left maxillary sinus. Right mastoid effusion, improved from the prior CT. Remaining sinuses clear. Negative orbit. Other: None IMPRESSION: Negative CT head Electronically Signed   By: Franchot Gallo M.D.   On: 12/30/2019 16:27    Procedures Procedures (including critical care time)  Medications Ordered in ED Medications  morphine 2 MG/ML injection 2 mg (2 mg Intravenous Given 12/30/19 1737)  sodium chloride 0.9 % bolus 500 mL (0 mLs Intravenous Stopped 12/30/19 1822)  morphine 2 MG/ML injection 2 mg (2 mg Intravenous Given 12/30/19 1844)    ED Course  I have reviewed the triage vital signs and the nursing notes.  Pertinent labs & imaging results that were available during my care of the patient were reviewed by me and considered in my medical decision making (see chart for details).    MDM Rules/Calculators/A&P                          54 year old male past history of chronic kidney disease, hypertension, diabetes who presents for evaluation of generalized weakness.  He sounds like he had a syncopal episode while at wound care center.  He is being followed  for wound on his right leg that is been ongoing for 7 months.  He states he felt lightheaded and got generalized weak.  His blood pressure was in the 117's which apparently is low for him and was sent to the ED for further evaluation.  Currently denies any chest pain or difficulty breathing.  On initial arrival, he is afebrile, nontoxic-appearing.  Vital signs are stable.  He is complaining of pain to his right leg.  He does have a wound noted with granulation tissue.  No surrounding warmth, erythema that would be concerning for infectious etiology.  Patient with good distal pulses, cap refill.  Exam not concerning for ischemic limb.  Doubt ACS etiology.  It sounds like more presyncope.  We will plan to check labs.  Troponin is 52.  CBC shows leukocytosis of 10.7, hemoglobin  of 9.4.  BMP shows potassium of 4.1, glucose of 184, BUN of 82, creatinine of 10.43.  He does have a bicarb of 16 and an anion gap of 16.  When I reviewed his records from the last few months, his creatinine and BUN have been continuously increasing.  2 months ago, he was around a 7.8 marker and it went up to about 8.2.  Discussed patient with Dr. Posey Pronto (nephrology). Patient does not need emergent dialysis at this time. Recommends giving 500cc-1L of fluids. If patient is admitted, nephrology can be consulted.   Chest x-ray shows cardiomegaly. No active disease. Tib-fib x-ray shows no evidence of osteomyelitis. CT head negative for any acute abnormalities.  VBG shows pH of 7.385. UA negative for infectious etiology, ketones.   Patient still complaining of pain in his leg.  Additional pain medication ordered.  At this time, he has an AKI with his creatinine going from 8-10.  We will continue gentle hydration and plan for admission for observation and repeat BUN and creatinine.  Discussed with Dr. Jonelle Sidle (hospitalist) who accepts patient for admission.  Portions of this note were generated with [employer]. Dictation  errors may occur despite best attempts at proofreading.   Final Clinical Impression(s) / ED Diagnoses Final diagnoses:  AKI (acute kidney injury) ([place])  Pre-syncope    Rx / DC Orders ED Discharge Orders    None       Desma Mcgregor 12/30/19 1920    Breck Coons, MD 12/31/19 1452

## 2019-12-30 NOTE — H&P (Signed)
History and Physical   Quindarius Ndiaye ZYS:[id number] DOB: March 14, 1966 DOA: 12/30/2019  Referring MD/NP/PA: Providence Lanius, PA  PCP: Bartholome Bill, MD   Outpatient Specialists: [facility]  Patient coming from: Home  Chief Complaint: Chest pain or shortness of breath  HPI: [name] is a 54 y.o. male with medical history significant of chronic kidney disease stage III-IV, diabetes, hypertension, right leg wound, obstructive sleep apnea on CPAP, hypertensive encephalopathy and adjustment disorder who is being followed by nephrology for advanced renal disease not yet requiring hemodialysis.  Patient was sent from wound care center where he went for his routine appointment today.  He has a right leg wound that has been dealing with for the past several months.  Patient said the wound is having a lot of pain to the extent that he is unable to move around.  It usually start on the right leg goes all the way up to his chest wall as well as headache on the same side.  While there he was noted to have worsening renal function.  He mentioned headache.  Patient was noted to be slightly confused and systolic blood pressure was about 117.  Normally it is much higher.  No exposure to COVID-19 and has been vaccinated fully.  Patient arrived the ER where his BUN/creatinine were noted to be higher than his baseline.  Currently creatinine is above 10.  Nephrology consulted and recommend admission.  No immediate need for hemodialysis..  ED Course: Temperature is 97.7, blood pressure 185/103 pulse 95 respiratory 22 oxygen sat 100% on room air.  Venous pH 7.385.  Troponin is 43 lactic acid 0.7 white count 12.5 hemoglobin 9.3 and platelet 457.  COVID-19 screen is negative.  Urinalysis largely within normal.  His hemoglobin A1c is 7.5.  His renal function showed BUN of 82 and creatinine 10.43.  Previous creatinine was 8.23.  Potassium however is normal at 4.1.  Admitted to the hospital for evaluation  by nephrology  Review of Systems: As per HPI otherwise 10 point review of systems negative.    Past Medical History:  Diagnosis Date  . Acute kidney injury ([place]) 12/28/2014  . Adjustment disorder with mixed anxiety and depressed mood 12/28/2014  . CPAP (continuous positive airway pressure) dependence   . Diabetes mellitus without complication ([place])   . DM (diabetes mellitus) type II controlled with renal manifestation ([place]) 12/28/2014  . DM (diabetes mellitus) type II controlled, neurological manifestation ([place]) 12/28/2014  . Encephalopathy, hypertensive 12/28/2014  . Hypertension   . Hypertensive emergency without congestive heart failure 12/27/2014  . Possible Panic disorder 12/28/2014  . Renal disorder   . 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 Sleep apnea     Past Surgical History:  Procedure Laterality Date  . EMPYEMA DRAINAGE Right 05/17/2017   Procedure: EMPYEMA DRAINAGE AND DECORTICATION;  Surgeon: Grace Isaac, MD;  Location: [place];  Service: Thoracic;  Laterality: Right;  . FLEXIBLE BRONCHOSCOPY  05/17/2017   Procedure: FLEXIBLE BRONCHOSCOPY;  Surgeon: Grace Isaac, MD;  Location: [place];  Service: Thoracic;;  . IR THORACENTESIS ASP PLEURAL SPACE W/IMG GUIDE  05/17/2017  . NO PAST SURGERIES    . THORACOTOMY/LOBECTOMY Right 05/17/2017   Procedure: RIGHT MINI THORACOTOMY;  Surgeon: Grace Isaac, MD;  Location: [place];  Service: Thoracic;  Laterality: Right;  Marland Kitchen VIDEO ASSISTED THORACOSCOPY (VATS)/EMPYEMA Right 05/17/2017   Procedure: RIGHT VIDEO ASSISTED THORACOSCOPY (VATS)/EMPYEMA;  Surgeon: Grace Isaac, MD;  Location: [place];  Service: Thoracic;  Laterality:  Right;     reports that he has never smoked. He has never used smokeless tobacco. He reports that he does not drink alcohol and does not use drugs.  No Known Allergies  Family History  Problem Relation Age of Onset  . Hypertension Mother      Prior to Admission medications   Medication Sig Start Date End  Date Taking? Authorizing Provider  acetaminophen (TYLENOL) 500 MG tablet Take 1,000-1,500 mg by mouth 2 (two) times daily as needed (pain).    [provider]  albuterol (PROVENTIL HFA;VENTOLIN HFA) 108 (90 BASE) MCG/ACT inhaler Inhale 2 puffs into the lungs every 6 (six) hours as needed for wheezing or shortness of breath.     [provider]  amLODipine (NORVASC) 10 MG tablet Take 1 tablet (10 mg total) by mouth daily. 12/29/14   Smiley Houseman, MD  amoxicillin-clavulanate (AUGMENTIN) 250-125 MG tablet Take 1 tablet by mouth 2 (two) times daily. 10/26/19   Georgette Shell, MD  apixaban (ELIQUIS) 5 MG TABS tablet Take 5 mg by mouth 2 (two) times daily.    [provider]  cloNIDine (CATAPRES - DOSED IN MG/24 HR) 0.2 mg/24hr patch Place 1 patch (0.2 mg total) onto the skin once a week. Patient taking differently: Place 0.2 mg onto the skin every Saturday.  05/22/17   Bufford Lope, DO  fluticasone (FLONASE) 50 MCG/ACT nasal spray Place 1 spray into both nostrils 2 (two) times daily. 10/06/19   [provider]  furosemide (LASIX) 80 MG tablet Take 120 mg by mouth daily.    [provider]  gabapentin (NEURONTIN) 300 MG capsule Take 1 capsule (300 mg total) by mouth daily. 10/28/19   Georgette Shell, MD  hydrALAZINE (APRESOLINE) 100 MG tablet Take 1 tablet (100 mg total) by mouth 4 (four) times daily. 10/26/19   Georgette Shell, MD  insulin NPH-regular Human (70-30) 100 UNIT/ML injection Inject 35 Units into the skin 2 (two) times daily before a meal.    [provider]  labetalol (NORMODYNE) 200 MG tablet Take 200 mg by mouth 2 (two) times daily.  12/14/15   [provider]  levocetirizine (XYZAL) 5 MG tablet Take 5 mg by mouth at bedtime. 10/06/19   [provider]  Multiple Vitamin (MULTIVITAMIN WITH MINERALS) TABS tablet Take 1 tablet by mouth daily. Centrum    [provider]  potassium chloride SA  (K-DUR,KLOR-CON) 20 MEQ tablet Take 20 mEq by mouth every morning.     [provider]  tiZANidine (ZANAFLEX) 4 MG tablet Take 4 mg by mouth every 8 (eight) hours as needed for muscle spasms.  04/08/17   [provider]  traMADol (ULTRAM) 50 MG tablet Take 50 mg by mouth 3 (three) times daily as needed (pain).  08/17/19   [provider]  Vitamin D, Ergocalciferol, (DRISDOL) 1.25 MG (50000 UNIT) CAPS capsule Take 50,000 Units by mouth every Sunday. 08/17/19   [provider]    Physical Exam: Vitals:   12/30/19 1722 12/30/19 1900 12/30/19 2145 12/30/19 2245  BP: (!) 185/103 (!) 169/94  (!) 178/91  Pulse: 93 93  95  Resp: _0 (!) 23  Temp:      TempSrc:      SpO2: 100% 97%  97%  Weight:      Height:          Constitutional: Obese, stable, no acute distress Vitals:   12/30/19 1722 12/30/19 1900 12/30/19 2145 12/30/19  2245  BP: (!) 185/103 (!) 169/94  (!) 178/91  Pulse: 93 93  95  Resp: _0 (!) 23  Temp:      TempSrc:      SpO2: 100% 97%  97%  Weight:      Height:       Eyes: PERRL, lids and conjunctivae normal ENMT: Mucous membranes are moist. Posterior pharynx clear of any exudate or lesions.Normal dentition.  Neck: normal, supple, no masses, no thyromegaly Respiratory: clear to auscultation bilaterally, no wheezing, mild lower lobe crackles. Normal respiratory effort. No accessory muscle use.  Cardiovascular: Regular rate and rhythm, no murmurs / rubs / gallops.  Trace extremity edema. 2+ pedal pulses. No carotid bruits.  Abdomen: no tenderness, no masses palpated. No hepatosplenomegaly. Bowel sounds positive.  Musculoskeletal: no clubbing / cyanosis. No joint deformity upper and lower extremities. Good ROM, no contractures. Normal muscle tone.  Skin: Right lower extremity also over the shin advance healing noted, no rashes, lesions, ulcers. No induration Neurologic: CN 2-12 grossly intact. Sensation intact, DTR normal. Strength 5/5  in all 4.  Psychiatric: Normal judgment and insight. Alert and oriented x 3. Normal mood.     Labs on Admission: I have personally reviewed following labs and imaging studies  CBC: Recent Labs  Lab 12/30/19 1023 12/30/19 1731 12/30/19 1923  WBC 10.7*  --  12.5*  HGB 9.4* 9.9* 9.3*  HCT 30.2* 29.0* 29.4*  MCV 90.7  --  92.5  PLT 440*  --  503*   Basic Metabolic Panel: Recent Labs  Lab 12/30/19 1023 12/30/19 1731 12/30/19 1923  NA 135 137  --   K 4.1 3.9  --   CL 103  --   --   CO2 16*  --   --   GLUCOSE 184*  --   --   BUN 82*  --   --   CREATININE 10.43*  --  10.49*  CALCIUM 9.1  --   --    GFR: Estimated Creatinine Clearance: 9.6 mL/min (A) (by C-G formula based on SCr of 10.49 mg/dL (H)). Liver Function Tests: No results for input(s): AST, ALT, ALKPHOS, BILITOT, PROT, ALBUMIN in the last 168 hours. No results for input(s): LIPASE, AMYLASE in the last 168 hours. No results for input(s): AMMONIA in the last 168 hours. Coagulation Profile: No results for input(s): INR, PROTIME in the last 168 hours. Cardiac Enzymes: No results for input(s): CKTOTAL, CKMB, CKMBINDEX, TROPONINI in the last 168 hours. BNP (last 3 results) No results for input(s): PROBNP in the last 8760 hours. HbA1C: Recent Labs    12/30/19 1923  HGBA1C 7.5*   CBG: Recent Labs  Lab 12/30/19 2227  GLUCAP 153*   Lipid Profile: No results for input(s): CHOL, HDL, LDLCALC, TRIG, CHOLHDL, LDLDIRECT in the last 72 hours. Thyroid Function Tests: No results for input(s): TSH, T4TOTAL, FREET4, T3FREE, THYROIDAB in the last 72 hours. Anemia Panel: No results for input(s): VITAMINB12, FOLATE, FERRITIN, TIBC, IRON, RETICCTPCT in the last 72 hours. Urine analysis:    Component Value Date/Time   COLORURINE STRAW (A) 12/30/2019 1721   APPEARANCEUR CLEAR 12/30/2019 1721   LABSPEC 1.011 12/30/2019 1721   PHURINE 5.0 12/30/2019 1721   GLUCOSEU 50 (A) 12/30/2019 1721   HGBUR NEGATIVE 12/30/2019 1721     BILIRUBINUR NEGATIVE 12/30/2019 1721   KETONESUR NEGATIVE 12/30/2019 1721   PROTEINUR 100 (A) 12/30/2019 1721   UROBILINOGEN 0.2 03/26/2014 1626   NITRITE NEGATIVE 12/30/2019 1721   LEUKOCYTESUR NEGATIVE  12/30/2019 1721   Sepsis Labs: _0 (procalcitonin:4,lacticidven:4) ) Recent Results (from the past 240 hour(s))  SARS Coronavirus 2 by RT PCR (hospital order, performed in [facility] hospital lab) Nasopharyngeal Nasopharyngeal Swab     Status: None   Collection Time: 12/30/19  6:37 PM   Specimen: Nasopharyngeal Swab  Result Value Ref Range Status   SARS Coronavirus 2 NEGATIVE NEGATIVE Final    Comment: (NOTE) SARS-CoV-2 target nucleic acids are NOT DETECTED.  The SARS-CoV-2 RNA is generally detectable in upper and lower respiratory specimens during the acute phase of infection. The lowest concentration of SARS-CoV-2 viral copies this assay can detect is 250 copies / mL. A negative result does not preclude SARS-CoV-2 infection and should not be used as the sole basis for treatment or other patient management decisions.  A negative result may occur with improper specimen collection / handling, submission of specimen other than nasopharyngeal swab, presence of viral mutation(s) within the areas targeted by this assay, and inadequate number of viral copies (<250 copies / mL). A negative result must be combined with clinical observations, patient history, and epidemiological information.  Fact Sheet for Patients:   StrictlyIdeas.no  Fact Sheet for Healthcare Providers: BankingDealers.co.za  This test is not yet approved or  cleared by the [place] FDA and has been authorized for detection and/or diagnosis of SARS-CoV-2 by FDA under an Emergency Use Authorization (EUA).  This EUA will remain in effect (meaning this test can be used) for the duration of the COVID-19 declaration under Section 564(b)(1) of the Act, 21  U.S.C. section 360bbb-3(b)(1), unless the authorization is terminated or revoked sooner.  Performed at [facility], [place] [address], [place], [place] [address]      Radiological Exams on Admission: DG Chest 2 View  Result Date: 12/30/2019 CLINICAL DATA:  Chest pain, right leg swelling EXAM: CHEST - 2 VIEW COMPARISON:  10/25/2019 FINDINGS: Cardiomegaly. No confluent opacities, effusions or edema. No acute bony abnormality. IMPRESSION: Cardiomegaly.  No active disease. Electronically Signed   By: Rolm Baptise M.D.   On: 12/30/2019 10:46   DG Tibia/Fibula Right  Result Date: 12/30/2019 CLINICAL DATA:  Leg wound pain, anterior X 7 months. EXAM: RIGHT TIBIA AND FIBULA - 2 VIEW COMPARISON:  X-ray right tibia fibula 10/25/2019 FINDINGS: There is no evidence of fracture or aggressive appearing focal bone lesions. No cortical erosion or destruction. Similar appearing cortical irregularity of the distal posterior tibial shaft and malleolus likely representing an old healed fracture or possible sessile exostosis. Soft tissues are unremarkable. IMPRESSION: 1. No acute fracture or dislocation of the bones of the right tibia fibula. 2. No radiographic evidence of osteomyelitis. Electronically Signed   By: Iven Finn M.D.   On: 12/30/2019 15:55   CT Head Wo Contrast  Result Date: 12/30/2019 CLINICAL DATA:  Headache new or worsening. Confusion, slurred speech EXAM: CT HEAD WITHOUT CONTRAST TECHNIQUE: Contiguous axial images were obtained from the base of the skull through the vertex without intravenous contrast. COMPARISON:  CT head 10/25/2019 FINDINGS: Brain: No evidence of acute infarction, hemorrhage, hydrocephalus, extra-axial collection or mass lesion/mass effect. Vascular: Negative for hyperdense vessel Skull: Negative Sinuses/Orbits: Retention cyst left maxillary sinus. Right mastoid effusion, improved from the prior CT. Remaining sinuses clear. Negative orbit. Other: None IMPRESSION: Negative  CT head Electronically Signed   By: Franchot Gallo M.D.   On: 12/30/2019 16:27    EKG: Independently reviewed.  Normal sinus rhythm no significant changes  Assessment/Plan Principal Problem:   Acute  renal failure superimposed on stage 3 chronic kidney disease (HCC) Active Problems:   CKD (chronic kidney disease), stage V (HCC)   Essential hypertension, malignant   Diabetes mellitus type 2 in nonobese (HCC)   Acute diastolic congestive heart failure (HCC)   Chronic ulcer of right leg (HCC)   AF (paroxysmal atrial fibrillation) (HCC)     #1 acute on chronic kidney disease stage V: Advanced kidney disease but worse.  Admit the patient.  Close monitoring on telemetry.  No immediate need for hemodialysis.  Nephrology consulted for further recommendations.  May require gentle hydration.  #2 paroxysmal A. fib: Appears to be at baseline.  Continue monitor  #3 diabetes: Sliding scale insulin.  #4 malignant hypertension: Continue home regimen and adjust.  #5 chronic right lower extremity ulcer: Continue wound care.  Patient complaining of ongoing pain.  Pain management.  Avoiding nonsteroidals. #6 obstructive sleep apnea: Patient reportedly has been on CPAP.  I will offer him the use of CPAP in the hospital if accepted.   DVT prophylaxis: Heparin Code Status: Full code Family Communication: Mother over the phone Disposition Plan: Home Consults called: Dr. Posey Pronto, nephrology Admission status: Inpatient  Severity of Illness: The appropriate patient status for this patient is INPATIENT. Inpatient status is judged to be reasonable and necessary in order to provide the required intensity of service to ensure the patient's safety. The patient's presenting symptoms, physical exam findings, and initial radiographic and laboratory data in the context of their chronic comorbidities is felt to place them at high risk for further clinical deterioration. Furthermore, it is not anticipated that the  patient will be medically stable for discharge from the hospital within 2 midnights of admission. The following factors support the patient status of inpatient.   " The patient's presenting symptoms include chest pain with worsening renal function. " The worrisome physical exam findings include right lower extremity wound. " The initial radiographic and laboratory data are worrisome because of creatinine of more than 10. " The chronic co-morbidities include diabetes and chronic kidney disease stage V.   * I certify that at the point of admission it is my clinical judgment that the patient will require inpatient hospital care spanning beyond 2 midnights from the point of admission due to high intensity of service, high risk for further deterioration and high frequency of surveillance required.Barbette Merino MD [facility] Pager [phone]  If 7PM-7AM, please contact night-coverage [email or web address] Password [facility]  12/30/2019, 11:18 PM

## 2019-12-30 NOTE — ED Triage Notes (Signed)
Pt to triage via [facility] from wound care center at [facility].  Wound care center reported confusion, slurred speech, SOB, and chest pain.  Pt was alert and oriented on EMS arrival with no neuro deficits.  Pt was anxious.  Reports pain at wound on R leg.  324mg  ASA.   18g LAC.  EMS states pt was initially groaning and about to fall off exam table but once separated from his mother he denied chest pain and anxiety decreased.  Currently reports 6/10 chest pain on arrival to ED.

## 2019-12-31 LAB — CBC
HCT: 29.5 % — ABNORMAL LOW (ref 39.0–52.0)
Hemoglobin: 9.3 g/dL — ABNORMAL LOW (ref 13.0–17.0)
MCH: 29 pg (ref 26.0–34.0)
MCHC: 31.5 g/dL (ref 30.0–36.0)
MCV: 91.9 fL (ref 80.0–100.0)
Platelets: 470 10*3/uL — ABNORMAL HIGH (ref 150–400)
RBC: 3.21 MIL/uL — ABNORMAL LOW (ref 4.22–5.81)
RDW: 14.5 % (ref 11.5–15.5)
WBC: 10.3 10*3/uL (ref 4.0–10.5)
nRBC: 0 % (ref 0.0–0.2)

## 2019-12-31 LAB — RENAL FUNCTION PANEL
Albumin: 2.6 g/dL — ABNORMAL LOW (ref 3.5–5.0)
Anion gap: 17 — ABNORMAL HIGH (ref 5–15)
BUN: 85 mg/dL — ABNORMAL HIGH (ref 6–20)
CO2: 16 mmol/L — ABNORMAL LOW (ref 22–32)
Calcium: 9 mg/dL (ref 8.9–10.3)
Chloride: 103 mmol/L (ref 98–111)
Creatinine, Ser: 10.5 mg/dL — ABNORMAL HIGH (ref 0.61–1.24)
GFR calc Af Amer: 6 mL/min — ABNORMAL LOW (ref >60)
GFR calc non Af Amer: 5 mL/min — ABNORMAL LOW (ref >60)
Glucose, Bld: 158 mg/dL — ABNORMAL HIGH (ref 70–99)
Phosphorus: 7 mg/dL — ABNORMAL HIGH (ref 2.5–4.6)
Potassium: 3.7 mmol/L (ref 3.5–5.1)
Sodium: 136 mmol/L (ref 135–145)

## 2019-12-31 LAB — IRON AND TIBC
Iron: 60 ug/dL (ref 45–182)
Saturation Ratios: 31 % (ref 17.9–39.5)
TIBC: 196 ug/dL — ABNORMAL LOW (ref 250–450)
UIBC: 136 ug/dL

## 2019-12-31 LAB — GLUCOSE, CAPILLARY: Glucose-Capillary: 154 mg/dL — ABNORMAL HIGH (ref 70–99)

## 2019-12-31 LAB — CBG MONITORING, ED
Glucose-Capillary: 124 mg/dL — ABNORMAL HIGH (ref 70–99)
Glucose-Capillary: 164 mg/dL — ABNORMAL HIGH (ref 70–99)
Glucose-Capillary: 145 mg/dL — ABNORMAL HIGH (ref 70–99)

## 2019-12-31 LAB — FERRITIN: Ferritin: 175 ng/mL (ref 24–336)

## 2019-12-31 MED ORDER — GABAPENTIN 300 MG PO CAPS
300.0000 mg | ORAL_CAPSULE | Freq: Every day | ORAL | Status: DC
  Administered 2020-01-01 – 2020-01-07 (×7): 300 mg via ORAL
  Filled 2019-12-31 (×7): qty 1

## 2019-12-31 MED ORDER — OXYCODONE HCL 5 MG PO TABS
5.0000 mg | ORAL_TABLET | Freq: Three times a day (TID) | ORAL | Status: AC | PRN
  Administered 2019-12-31 – 2020-01-01 (×2): 5 mg via ORAL
  Filled 2019-12-31 (×2): qty 1

## 2019-12-31 MED ORDER — CALCITRIOL 0.25 MCG PO CAPS
0.2500 ug | ORAL_CAPSULE | ORAL | Status: DC
  Administered 2020-01-01 – 2020-01-06 (×3): 0.25 ug via ORAL
  Filled 2019-12-31 (×2): qty 1

## 2019-12-31 MED ORDER — SEVELAMER CARBONATE 800 MG PO TABS
800.0000 mg | ORAL_TABLET | Freq: Three times a day (TID) | ORAL | Status: DC
  Administered 2020-01-01 – 2020-01-07 (×13): 800 mg via ORAL
  Filled 2019-12-31 (×13): qty 1

## 2019-12-31 MED ORDER — AMLODIPINE BESYLATE 10 MG PO TABS
10.0000 mg | ORAL_TABLET | Freq: Every day | ORAL | Status: DC
  Administered 2019-12-31 – 2020-01-07 (×8): 10 mg via ORAL
  Filled 2019-12-31: qty 1
  Filled 2019-12-31: qty 2
  Filled 2019-12-31 (×6): qty 1

## 2019-12-31 MED ORDER — GABAPENTIN 300 MG PO CAPS: 300.0000 mg | ORAL_CAPSULE | Freq: Every day | ORAL | Status: DC

## 2019-12-31 MED ORDER — LABETALOL HCL 200 MG PO TABS
200.0000 mg | ORAL_TABLET | Freq: Two times a day (BID) | ORAL | Status: DC
  Administered 2019-12-31 – 2020-01-02 (×5): 200 mg via ORAL
  Filled 2019-12-31 (×5): qty 1

## 2019-12-31 MED ORDER — CHLORHEXIDINE GLUCONATE CLOTH 2 % EX PADS
6.0000 | MEDICATED_PAD | Freq: Every day | CUTANEOUS | Status: DC
  Administered 2020-01-01 – 2020-01-07 (×6): 6 via TOPICAL

## 2019-12-31 MED ORDER — GABAPENTIN 300 MG PO CAPS
300.0000 mg | ORAL_CAPSULE | Freq: Three times a day (TID) | ORAL | Status: DC
  Administered 2019-12-31 (×2): 300 mg via ORAL
  Filled 2019-12-31 (×2): qty 1

## 2019-12-31 NOTE — ED Notes (Signed)
Pt provided with sack lunch and diet ginger ale per patient's request.

## 2019-12-31 NOTE — Progress Notes (Addendum)
PROGRESS NOTE    [name]  SWF:[id number] DOB: April 15, 1965 DOA: 12/30/2019 PCP: Bartholome Bill, MD   Brief Narrative:  [name] is a 54 y.o. male with medical history significant of chronic kidney disease stage IV, diabetes, hypertension, right leg wound, obstructive sleep apnea on CPAP, hypertensive encephalopathy and adjustment disorder who is being followed by nephrology for advanced renal disease not yet requiring hemodialysis.  Patient was sent from wound care center where he went for his routine appointment today.  He has a right leg wound that has been dealing with for the past several months.  Patient said the wound is having a lot of pain to the extent that he is unable to move around.  It usually start on the right leg goes all the way up to his chest wall as well as headache on the same side.  While there he was noted to have worsening renal function.  He mentioned headache.  Patient was noted to be slightly confused and systolic blood pressure was about 117.  Normally it is much higher.  No exposure to COVID-19 and has been vaccinated fully.  Patient arrived the ER where his BUN/creatinine were noted to be higher than his baseline.  Currently creatinine is above 10.  Nephrology consulted and recommend admission.  No immediate need for hemodialysis. In ED: Temperature is 97.7, blood pressure 185/103 pulse 95 respiratory 22 oxygen sat 100% on room air.  Venous pH 7.385.  Troponin is 43 lactic acid 0.7 white count 12.5 hemoglobin 9.3 and platelet 457.  COVID-19 screen is negative.  Urinalysis largely within normal.  His hemoglobin A1c is 7.5.  His renal function showed BUN of 82 and creatinine 10.43.  Previous creatinine was 8.23.  Potassium however is normal at 4.1.  Admitted to the hospital for evaluation by nephrology   Assessment & Plan:   Principal Problem:   Acute renal failure superimposed on stage 5 chronic kidney disease, not on chronic dialysis ([place]) Active Problems:    CKD (chronic kidney disease), stage V ([place])   Essential hypertension, malignant   Diabetes mellitus type 2 in nonobese (HCC)   Acute diastolic congestive heart failure (HCC)   Chronic ulcer of right leg (HCC)   AF (paroxysmal atrial fibrillation) (HCC)   Acute on chronic kidney disease stage V, POA:  Appears to be prerenal given discussion with patient who had "low blood pressure" at his wound care clinic Concern for ATN, nephrology following along appreciate insight and recommendations Baseline creatinine over the past 2 months appears to be in the high 7 range -unclear if dialysis has been discussed in the outpatient setting  Paroxysmal A. fib:  Currently normal sinus rhythm  Insulin-dependent diabetes type 2, uncontrolled, hyperglycemia, POA  Continue sliding scale insulin, hypoglycemic protocol Home insulin appears to be 70/30 25 units twice daily Lab Results  Component Value Date   HGBA1C 7.5 (H) 12/30/2019   Malignant hypertension:  - Patient reports outpatient hypotension with cessation or holding of certain home medications which likely caused rebound malignant hypertension as above exacerbating kidney disease - Home regimen includes clonidine patch, Lasix 40 daily, hydralazine 100 every 6 hours -We have restarted patient's home amlodipine and labetalol -we will attempt to avoid clonidine even concern for rebound hypertension as well as avoid any nephrotoxic's given above  Chronic right lower extremity ulcer:  Appears well-healing, continue wound care.  Patient complaining of neuropathic pain in this area, will increase gabapentin to 3 times daily Wound does not appear  to be acute or inflamed, would not administer IV narcotics despite patient's specific request  Obstructive sleep apnea:  Patient reportedly has been on CPAP -offered to use in hospital if patient agreeable  DVT prophylaxis: Heparin Code Status: Full code Family Communication: None present  Status is:  Inpatient  Dispo: The patient is from: Home              Anticipated d/c is to: To be determined              Anticipated d/c date is: Likely 4 to 5 days pending clinical course              Patient currently not medically stable for discharge given worsening kidney function, uncontrolled blood pressure need for close monitoring and possible intervention with dialysis  Consultants:   Neprhology: Dr. Daylene Katayama  Procedures:   None  Antimicrobials:  None indicated  Subjective: No acute issues or events overnight, patient's only complaint is right lower extremity neuropathic pain otherwise denies headache, fever, chills, nausea, vomiting, diarrhea, constipation, chest pain, shortness of breath.  Objective: Vitals:   12/31/19 1150 12/31/19 1300 12/31/19 1415 12/31/19 1430  BP: (!) 184/108 (!) 170/107 (!) 158/97   Pulse: 89 81 81   Resp: 16 14  (!) 21  Temp:      TempSrc:      SpO2: 100% 97% 96%   Weight:      Height:        Intake/Output Summary (Last 24 hours) at 12/31/2019 1633 Last data filed at 12/30/2019 2255 Gross per 24 hour  Intake 900 ml  Output 650 ml  Net 250 ml   Filed Weights   12/30/19 1024  Weight: 109.8 kg    Examination:  General exam: Appears calm and comfortable  Respiratory system: Clear to auscultation. Respiratory effort normal. Cardiovascular system: S1 & S2 heard, RRR. No JVD, murmurs, rubs, gallops or clicks. No pedal edema. Gastrointestinal system: Abdomen is nondistended, soft and nontender. No organomegaly or masses felt. Normal bowel sounds heard. Central nervous system: Alert and oriented. No focal neurological deficits. Extremities: Symmetric 5 x 5 power. Skin: No rashes, lesions or ulcers Psychiatry: Judgement and insight appear normal. Mood & affect appropriate.     Data Reviewed: I have personally reviewed following labs and imaging studies  CBC: Recent Labs  Lab 12/30/19 1023 12/30/19 1731 12/30/19 1923 12/31/19 0500    WBC 10.7*  --  12.5* 10.3  HGB 9.4* 9.9* 9.3* 9.3*  HCT 30.2* 29.0* 29.4* 29.5*  MCV 90.7  --  92.5 91.9  PLT 440*  --  457* 767*   Basic Metabolic Panel: Recent Labs  Lab 12/30/19 1023 12/30/19 1731 12/30/19 1923 12/31/19 0500  NA 135 137  --  136  K 4.1 3.9  --  3.7  CL 103  --   --  103  CO2 16*  --   --  16*  GLUCOSE 184*  --   --  158*  BUN 82*  --   --  85*  CREATININE 10.43*  --  10.49* 10.50*  CALCIUM 9.1  --   --  9.0  PHOS  --   --   --  7.0*   GFR: Estimated Creatinine Clearance: 9.6 mL/min (A) (by C-G formula based on SCr of 10.5 mg/dL (H)). Liver Function Tests: Recent Labs  Lab 12/31/19 0500  ALBUMIN 2.6*   No results for input(s): LIPASE, AMYLASE in the last 168 hours. No results for input(s):  AMMONIA in the last 168 hours. Coagulation Profile: No results for input(s): INR, PROTIME in the last 168 hours. Cardiac Enzymes: No results for input(s): CKTOTAL, CKMB, CKMBINDEX, TROPONINI in the last 168 hours. BNP (last 3 results) No results for input(s): PROBNP in the last 8760 hours. HbA1C: Recent Labs    12/30/19 1923  HGBA1C 7.5*   CBG: Recent Labs  Lab 12/30/19 2227 12/31/19 0812 12/31/19 1147  GLUCAP 153* 124* 164*   Lipid Profile: No results for input(s): CHOL, HDL, LDLCALC, TRIG, CHOLHDL, LDLDIRECT in the last 72 hours. Thyroid Function Tests: No results for input(s): TSH, T4TOTAL, FREET4, T3FREE, THYROIDAB in the last 72 hours. Anemia Panel: No results for input(s): VITAMINB12, FOLATE, FERRITIN, TIBC, IRON, RETICCTPCT in the last 72 hours. Sepsis Labs: Recent Labs  Lab 12/30/19 1721  LATICACIDVEN 0.7    Recent Results (from the past 240 hour(s))  SARS Coronavirus 2 by RT PCR (hospital order, performed in [facility] hospital lab) Nasopharyngeal Nasopharyngeal Swab     Status: None   Collection Time: 12/30/19  6:37 PM   Specimen: Nasopharyngeal Swab  Result Value Ref Range Status   SARS Coronavirus 2 NEGATIVE NEGATIVE Final     Comment: (NOTE) SARS-CoV-2 target nucleic acids are NOT DETECTED.  The SARS-CoV-2 RNA is generally detectable in upper and lower respiratory specimens during the acute phase of infection. The lowest concentration of SARS-CoV-2 viral copies this assay can detect is 250 copies / mL. A negative result does not preclude SARS-CoV-2 infection and should not be used as the sole basis for treatment or other patient management decisions.  A negative result may occur with improper specimen collection / handling, submission of specimen other than nasopharyngeal swab, presence of viral mutation(s) within the areas targeted by this assay, and inadequate number of viral copies (<250 copies / mL). A negative result must be combined with clinical observations, patient history, and epidemiological information.  Fact Sheet for Patients:   StrictlyIdeas.no  Fact Sheet for Healthcare Providers: BankingDealers.co.za  This test is not yet approved or  cleared by the [place] FDA and has been authorized for detection and/or diagnosis of SARS-CoV-2 by FDA under an Emergency Use Authorization (EUA).  This EUA will remain in effect (meaning this test can be used) for the duration of the COVID-19 declaration under Section 564(b)(1) of the Act, 21 U.S.C. section 360bbb-3(b)(1), unless the authorization is terminated or revoked sooner.  Performed at [facility], [place] [address] [place], [place] [address]          Radiology Studies: DG Chest 2 View  Result Date: 12/30/2019 CLINICAL DATA:  Chest pain, right leg swelling EXAM: CHEST - 2 VIEW COMPARISON:  10/25/2019 FINDINGS: Cardiomegaly. No confluent opacities, effusions or edema. No acute bony abnormality. IMPRESSION: Cardiomegaly.  No active disease. Electronically Signed   By: Rolm Baptise M.D.   On: 12/30/2019 10:46   DG Tibia/Fibula Right  Result Date: 12/30/2019 CLINICAL DATA:  Leg wound  pain, anterior X 7 months. EXAM: RIGHT TIBIA AND FIBULA - 2 VIEW COMPARISON:  X-ray right tibia fibula 10/25/2019 FINDINGS: There is no evidence of fracture or aggressive appearing focal bone lesions. No cortical erosion or destruction. Similar appearing cortical irregularity of the distal posterior tibial shaft and malleolus likely representing an old healed fracture or possible sessile exostosis. Soft tissues are unremarkable. IMPRESSION: 1. No acute fracture or dislocation of the bones of the right tibia fibula. 2. No radiographic evidence of osteomyelitis. Electronically Signed   By:  Iven Finn M.D.   On: 12/30/2019 15:55   CT Head Wo Contrast  Result Date: 12/30/2019 CLINICAL DATA:  Headache new or worsening. Confusion, slurred speech EXAM: CT HEAD WITHOUT CONTRAST TECHNIQUE: Contiguous axial images were obtained from the base of the skull through the vertex without intravenous contrast. COMPARISON:  CT head 10/25/2019 FINDINGS: Brain: No evidence of acute infarction, hemorrhage, hydrocephalus, extra-axial collection or mass lesion/mass effect. Vascular: Negative for hyperdense vessel Skull: Negative Sinuses/Orbits: Retention cyst left maxillary sinus. Right mastoid effusion, improved from the prior CT. Remaining sinuses clear. Negative orbit. Other: None IMPRESSION: Negative CT head Electronically Signed   By: Franchot Gallo M.D.   On: 12/30/2019 16:27    Scheduled Meds: . amLODipine  10 mg Oral Daily  . gabapentin  300 mg Oral TID  . heparin  5,000 Units Subcutaneous Q8H  . insulin aspart  0-5 Units Subcutaneous QHS  . insulin aspart  0-6 Units Subcutaneous TID WC  . labetalol  200 mg Oral BID    LOS: 1 day   Time spent: 34min  Gionna Polak C Kelcy Baeten, DO [facility]  If 7PM-7AM, please contact night-coverage [email or web address]  12/31/2019, 4:33 PM

## 2019-12-31 NOTE — Progress Notes (Signed)
Pt arrived to [facility] alert ad oriented x 4, identified appropriately. Pt VS stable, denied chest pain and SOB, no signs of acute distress.  Cardiac monitor in place and CCMD notified, pt oriented to room and equipment, instructed to use call bell for assistance and call bell left within reach. Bed alarm in place. Will continue to monitor pt and treat per MD orders.

## 2019-12-31 NOTE — ED Notes (Signed)
Pt provided with lunch tray.

## 2019-12-31 NOTE — ED Notes (Signed)
Pt sitting up in bed, eating breakfast tray. Pt provided with OJ and ice water per request.

## 2019-12-31 NOTE — Consult Note (Addendum)
[facility] Renal Consultation Note  Requesting MD: Holli Humbles, MD  Indication for Consultation:  CKD stage V  Chief complaint: chest pain   HPI:  [name] is a 54 y.o. male with a history of CKD stage V, hypertension, diabetes, obstructive sleep apnea, chronic diastolic CHF, paroxysmal atrial fibrillation, and leg ulcer who presented to the hospital with chest discomfort.  Note that he had just been seen in clinic at [facility] on 12/29/19.  He re-established care with our practice in 11/2019 with Dr. Marval Regal since being lost to follow-up with our practice since 2013.  In the interim had been followed at [facility] per charting.  At the initial visit he was referred to [facility] for permanent access in anticipation of the need for dialysis soon -his appointment with [facility] is 10/27.  On 12/29/2019 creatinine was 9.48 at [facility].  Note that his baseline creatinine was about 7-8 in 10/2019.  Labs a couple of years prior to that in 2019 demonstrate a baseline Cr of around 4.  Urinating ok.  No n/v and he denies shortness of breath.  He was seen at wound care clinic and noted to be relatively hypotensive per charting.  Medications were held here and he has been hypertensive.  He states last dose of eliquis was Tues PM.  We discussed the indications of dialysis and he does wish to proceed with dialysis.  He had questions regarding renal transplant past this could be reevaluated in his outpatient unit per his primary nephrologist.  Creatinine, Ser  Date/Time Value Ref Range Status  12/31/2019 05:00 AM 10.50 (H) 0.61 - 1.24 mg/dL Final  12/30/2019 07:23 PM 10.49 (H) 0.61 - 1.24 mg/dL Final  12/30/2019 10:23 AM 10.43 (H) 0.61 - 1.24 mg/dL Final  10/27/2019 05:08 AM 8.23 (H) 0.61 - 1.24 mg/dL Final  10/26/2019 06:47 AM 7.69 (H) 0.61 - 1.24 mg/dL Final  10/25/2019 02:07 PM 7.85 (H) 0.61 - 1.24 mg/dL Final  11/25/2017 08:53 PM 4.05 (H) 0.61 - 1.24 mg/dL Final  07/29/2017 02:37 AM  3.12 (H) 0.61 - 1.24 mg/dL Final  07/28/2017 03:28 AM 3.99 (H) 0.61 - 1.24 mg/dL Final  07/27/2017 05:21 PM 4.29 (H) 0.61 - 1.24 mg/dL Final  07/27/2017 03:36 PM 4.30 (H) 0.61 - 1.24 mg/dL Final  05/22/2017 04:41 AM 2.16 (H) 0.61 - 1.24 mg/dL Final  05/21/2017 05:31 AM 2.26 (H) 0.61 - 1.24 mg/dL Final  05/20/2017 05:16 AM 2.30 (H) 0.61 - 1.24 mg/dL Final  05/19/2017 04:44 AM 2.65 (H) 0.61 - 1.24 mg/dL Final  05/18/2017 02:35 AM 3.00 (H) 0.61 - 1.24 mg/dL Final  05/16/2017 03:50 PM 3.19 (H) 0.61 - 1.24 mg/dL Final  05/16/2017 11:19 AM 3.32 (H) 0.61 - 1.24 mg/dL Final  12/05/2015 04:43 PM 3.02 (H) 0.61 - 1.24 mg/dL Final  12/29/2014 10:28 AM 2.81 (H) 0.61 - 1.24 mg/dL Final  12/28/2014 05:33 AM 2.70 (H) 0.61 - 1.24 mg/dL Final  12/27/2014 12:41 PM 2.70 (H) 0.61 - 1.24 mg/dL Final  12/27/2014 12:35 PM 2.90 (H) 0.61 - 1.24 mg/dL Final  03/26/2014 02:50 PM 1.99 (H) 0.50 - 1.35 mg/dL Final  03/26/2014 01:50 PM 2.10 (H) 0.50 - 1.35 mg/dL Final  06/28/2013 06:35 AM 1.77 (H) 0.50 - 1.35 mg/dL Final  06/27/2013 01:35 PM 1.71 (H) 0.50 - 1.35 mg/dL Final  06/27/2013 05:03 AM 1.67 (H) 0.50 - 1.35 mg/dL Final  06/27/2013 02:46 AM 1.66 (H) 0.50 - 1.35 mg/dL Final  06/27/2013 12:50 AM 1.81 (H) 0.50 - 1.35 mg/dL  Final  06/26/2013 10:45 PM 1.83 (H) 0.50 - 1.35 mg/dL Final  06/26/2013 04:48 PM 1.98 (H) 0.50 - 1.35 mg/dL Final  05/30/2012 06:47 PM 2.21 (H) 0.50 - 1.35 mg/dL Final  03/14/2012 08:30 PM 2.49 (H) 0.50 - 1.35 mg/dL Final  10/11/2011 01:00 AM 2.13 (H) 0.50 - 1.35 mg/dL Final  03/31/2011 06:30 AM 2.13 (H) 0.50 - 1.35 mg/dL Final  03/30/2011 03:45 PM 2.03 (H) 0.50 - 1.35 mg/dL Final  03/30/2011 05:45 AM 2.08 (H) 0.50 - 1.35 mg/dL Final  03/29/2011 06:00 AM 2.04 (H) 0.50 - 1.35 mg/dL Final  03/28/2011 04:54 PM 1.95 (H) 0.50 - 1.35 mg/dL Final  01/10/2011 06:15 AM 1.85 (H) 0.50 - 1.35 mg/dL Final  01/09/2011 06:39 AM 1.91 (H) 0.50 - 1.35 mg/dL Final  01/08/2011 11:01 PM 2.02 (H) 0.50 - 1.35  mg/dL Final  09/03/2010 03:52 PM 1.81 (H) 0.40 - 1.50 mg/dL Final     PMHx:   Past Medical History:  Diagnosis Date  . Acute kidney injury ([place]) 12/28/2014  . Adjustment disorder with mixed anxiety and depressed mood 12/28/2014  . CPAP (continuous positive airway pressure) dependence   . Diabetes mellitus without complication ([place])   . DM (diabetes mellitus) type II controlled with renal manifestation ([place]) 12/28/2014  . DM (diabetes mellitus) type II controlled, neurological manifestation ([place]) 12/28/2014  . Encephalopathy, hypertensive 12/28/2014  . Hypertension   . Hypertensive emergency without congestive heart failure 12/27/2014  . Possible Panic disorder 12/28/2014  . Renal disorder   . 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 Sleep apnea     Past Surgical History:  Procedure Laterality Date  . EMPYEMA DRAINAGE Right 05/17/2017   Procedure: EMPYEMA DRAINAGE AND DECORTICATION;  Surgeon: Grace Isaac, MD;  Location: [facility];  Service: Thoracic;  Laterality: Right;  . FLEXIBLE BRONCHOSCOPY  05/17/2017   Procedure: FLEXIBLE BRONCHOSCOPY;  Surgeon: Grace Isaac, MD;  Location: [facility];  Service: Thoracic;;  . IR THORACENTESIS ASP PLEURAL SPACE W/IMG GUIDE  05/17/2017  . NO PAST SURGERIES    . THORACOTOMY/LOBECTOMY Right 05/17/2017   Procedure: RIGHT MINI THORACOTOMY;  Surgeon: Grace Isaac, MD;  Location: [facility];  Service: Thoracic;  Laterality: Right;  Marland Kitchen VIDEO ASSISTED THORACOSCOPY (VATS)/EMPYEMA Right 05/17/2017   Procedure: RIGHT VIDEO ASSISTED THORACOSCOPY (VATS)/EMPYEMA;  Surgeon: Grace Isaac, MD;  Location: [facility];  Service: Thoracic;  Laterality: Right;    Family Hx:  Family History  Problem Relation Age of Onset  . Hypertension Mother     Social History:  reports that he has never smoked. He has never used smokeless tobacco. He reports that he does not drink alcohol and does not use drugs.  Allergies: No Known Allergies  Medications: Prior to Admission  medications   Medication Sig Start Date End Date Taking? Authorizing Provider  acetaminophen (TYLENOL) 500 MG tablet Take 1,000-1,500 mg by mouth 2 (two) times daily as needed (pain).   Yes [provider]  albuterol (PROVENTIL HFA;VENTOLIN HFA) 108 (90 BASE) MCG/ACT inhaler Inhale 2 puffs into the lungs every 6 (six) hours as needed for wheezing or shortness of breath.    Yes [provider]  amLODipine (NORVASC) 10 MG tablet Take 1 tablet (10 mg total) by mouth daily. 12/29/14  Yes Smiley Houseman, MD  apixaban (ELIQUIS) 5 MG TABS tablet Take 5 mg by mouth 2 (two) times daily.   Yes [provider]  cloNIDine (CATAPRES - DOSED IN MG/24 HR) 0.2 mg/24hr patch Place 1 patch (0.2 mg  total) onto the skin once a week. Patient taking differently: Place 0.2 mg onto the skin every Saturday.  05/22/17  Yes Orson Eva J, DO  fluticasone (FLONASE) 50 MCG/ACT nasal spray Place 1 spray into both nostrils 2 (two) times daily. 10/06/19  Yes [provider]  furosemide (LASIX) 80 MG tablet Take 120 mg by mouth daily.   Yes [provider]  gabapentin (NEURONTIN) 300 MG capsule Take 1 capsule (300 mg total) by mouth daily. 10/28/19  Yes Georgette Shell, MD  hydrALAZINE (APRESOLINE) 100 MG tablet Take 1 tablet (100 mg total) by mouth 4 (four) times daily. 10/26/19  Yes Georgette Shell, MD  HYDROcodone-acetaminophen (NORCO) 7.5-325 MG tablet Take 1 tablet by mouth every 4 (four) hours as needed. 12/11/19  Yes [provider]  insulin NPH-regular Human (70-30) 100 UNIT/ML injection Inject 25 Units into the skin 2 (two) times daily before a meal.    Yes [provider]  labetalol (NORMODYNE) 200 MG tablet Take 200 mg by mouth 2 (two) times daily.  12/14/15  Yes [provider]  levocetirizine (XYZAL) 5 MG tablet Take 5 mg by mouth at bedtime. 10/06/19  Yes [provider]  Multiple Vitamin (MULTIVITAMIN WITH MINERALS) TABS tablet Take  1 tablet by mouth daily. Centrum   Yes [provider]  potassium chloride SA (K-DUR,KLOR-CON) 20 MEQ tablet Take 20 mEq by mouth every morning.    Yes [provider]  tiZANidine (ZANAFLEX) 4 MG tablet Take 4 mg by mouth every 8 (eight) hours as needed for muscle spasms.  04/08/17  Yes [provider]  traMADol (ULTRAM) 50 MG tablet Take 50 mg by mouth 3 (three) times daily as needed (pain).  08/17/19  Yes [provider]  Vitamin D, Ergocalciferol, (DRISDOL) 1.25 MG (50000 UNIT) CAPS capsule Take 50,000 Units by mouth every Sunday. 08/17/19  Yes [provider]    I have reviewed the patient's current and prior to admission medications.  Labs:  BMP Latest Ref Rng & Units 12/31/2019 12/30/2019 12/30/2019  Glucose 70 - 99 mg/dL 158(H) - -  BUN 6 - 20 mg/dL 85(H) - -  Creatinine 0.61 - 1.24 mg/dL 10.50(H) 10.49(H) -  Sodium 135 - 145 mmol/L 136 - 137  Potassium 3.5 - 5.1 mmol/L 3.7 - 3.9  Chloride 98 - 111 mmol/L 103 - -  CO2 22 - 32 mmol/L 16(L) - -  Calcium 8.9 - 10.3 mg/dL 9.0 - -    Urinalysis    Component Value Date/Time   COLORURINE STRAW (A) 12/30/2019 1721   APPEARANCEUR CLEAR 12/30/2019 1721   LABSPEC 1.011 12/30/2019 1721   PHURINE 5.0 12/30/2019 1721   GLUCOSEU 50 (A) 12/30/2019 1721   HGBUR NEGATIVE 12/30/2019 1721   BILIRUBINUR NEGATIVE 12/30/2019 1721   KETONESUR NEGATIVE 12/30/2019 1721   PROTEINUR 100 (A) 12/30/2019 1721   UROBILINOGEN 0.2 03/26/2014 1626   NITRITE NEGATIVE 12/30/2019 1721   LEUKOCYTESUR NEGATIVE 12/30/2019 1721     ROS:  Pertinent items noted in HPI and remainder of comprehensive ROS otherwise negative.    Physical Exam: Vitals:   12/31/19 1415 12/31/19 1430  BP: (!) 158/97   Pulse: 81   Resp:  (!) 21  Temp:    SpO2: 96%      General: Adult male in stretcher in no acute distress at rest HEENT: Normocephalic atraumatic Eyes: Extraocular movements intact sclera anicteric Neck: Supple trachea  midline Heart S1-S2 no rub appreciated Lungs: Clear to auscultation bilaterally  unlabored at rest on room air Abdomen: softly dist/obese habitus/nontender Extremities: no pitting edema. No cyanosis or clubbing Skin: No rash on extremities exposed Neuro: Alert and oriented x3 provides a history and follows commands Psych normal mood and affect  Assessment/Plan:  # CKD stage V with progression to ESRD - CKD 2/2 DM and HTN  - NPO after midnight for tunneled catheter for HD initiation on 01/01/20 - defer any scheduled potassium at this time - he is currently off of eliquis - please remain off - Will need to coordinate permanent access with the eliquis  - Will need to CLIP to an outpatient HD unit  - please avoid morphine or NSAID's for pain  - Would reduce gabapentin to no more than 300 mg daily given his renal disease  # HTN  - agree with resuming labetalol and amlodipine and would continue clonidine patch   # Chronic diastolic CHF  - back on beta blocker - euvolemic on exam   # Metabolic acidosis  - Starting HD   # Anemia of CKD  - No urgent indication for PRBC's  - update iron panel and anticipate need for ESA  # DM with CKD  - per primary team   # Secondary hyperpara/metabolic bone disease - Start renvela 800 mg TID with meals and follow with HD  - Start calcitriol 0.25 mcg three times a week here - will be dosed at the HD unit after discharge  Claudia Desanctis 12/31/2019, 5:53 PM

## 2019-12-31 NOTE — ED Notes (Signed)
Attempted to call report, RN will call back.

## 2019-12-31 NOTE — ED Notes (Signed)
MD notified of patient's BP elevation. Pt requesting pain medication for chronic leg wound. Notified MD in regards to patient's pain.

## 2019-12-31 NOTE — Progress Notes (Signed)
Pt has wound to left lower extremity. C/o pain at the site 8/10. Tylenol given previously did not help.  Opyd, MD notified.

## 2019-12-31 NOTE — ED Notes (Signed)
Report called to [name], Therapist, sports. Pt to 2W-21 via stretcher by tech.

## 2019-12-31 NOTE — ED Notes (Signed)
Assuming care of patient at this time. Pt resting in bed, playing on personal cell phone. Pt in NAD. Resp even and unlabored. Skin warm and dry.

## 2020-01-01 ENCOUNTER — Inpatient Hospital Stay (HOSPITAL_COMMUNITY): Payer: Medicare Other

## 2020-01-01 HISTORY — PX: IR US GUIDE VASC ACCESS RIGHT: IMG2390

## 2020-01-01 HISTORY — PX: IR FLUORO GUIDE CV LINE RIGHT: IMG2283

## 2020-01-01 LAB — RENAL FUNCTION PANEL
Albumin: 2.4 g/dL — ABNORMAL LOW (ref 3.5–5.0)
Anion gap: 14 (ref 5–15)
BUN: 88 mg/dL — ABNORMAL HIGH (ref 6–20)
CO2: 18 mmol/L — ABNORMAL LOW (ref 22–32)
Calcium: 8.7 mg/dL — ABNORMAL LOW (ref 8.9–10.3)
Chloride: 103 mmol/L (ref 98–111)
Creatinine, Ser: 10.73 mg/dL — ABNORMAL HIGH (ref 0.61–1.24)
GFR calc Af Amer: 6 mL/min — ABNORMAL LOW (ref >60)
GFR calc non Af Amer: 5 mL/min — ABNORMAL LOW (ref >60)
Glucose, Bld: 164 mg/dL — ABNORMAL HIGH (ref 70–99)
Phosphorus: 7.3 mg/dL — ABNORMAL HIGH (ref 2.5–4.6)
Potassium: 3.5 mmol/L (ref 3.5–5.1)
Sodium: 135 mmol/L (ref 135–145)

## 2020-01-01 LAB — GLUCOSE, CAPILLARY
Glucose-Capillary: 137 mg/dL — ABNORMAL HIGH (ref 70–99)
Glucose-Capillary: 134 mg/dL — ABNORMAL HIGH (ref 70–99)
Glucose-Capillary: 133 mg/dL — ABNORMAL HIGH (ref 70–99)
Glucose-Capillary: 253 mg/dL — ABNORMAL HIGH (ref 70–99)

## 2020-01-01 LAB — HEPATITIS B SURFACE ANTIBODY,QUALITATIVE: Hep B S Ab: NONREACTIVE

## 2020-01-01 LAB — HEPATITIS B CORE ANTIBODY, TOTAL: Hep B Core Total Ab: NONREACTIVE

## 2020-01-01 LAB — HEPATITIS B SURFACE ANTIGEN: Hepatitis B Surface Ag: NONREACTIVE

## 2020-01-01 MED ORDER — HEPARIN SODIUM (PORCINE) 1000 UNIT/ML DIALYSIS
1000.0000 [IU] | INTRAMUSCULAR | Status: DC | PRN
  Filled 2020-01-01 (×2): qty 1

## 2020-01-01 MED ORDER — FENTANYL CITRATE (PF) 100 MCG/2ML IJ SOLN
INTRAMUSCULAR | Status: AC | PRN
  Administered 2020-01-01: 25 ug via INTRAVENOUS

## 2020-01-01 MED ORDER — FENTANYL CITRATE (PF) 100 MCG/2ML IJ SOLN
INTRAMUSCULAR | Status: AC
  Filled 2020-01-01: qty 2

## 2020-01-01 MED ORDER — CEFAZOLIN SODIUM-DEXTROSE 2-4 GM/100ML-% IV SOLN
INTRAVENOUS | Status: AC
  Filled 2020-01-01: qty 100

## 2020-01-01 MED ORDER — LIDOCAINE-EPINEPHRINE 1 %-1:100000 IJ SOLN
INTRAMUSCULAR | Status: AC
  Filled 2020-01-01: qty 1

## 2020-01-01 MED ORDER — DARBEPOETIN ALFA 40 MCG/0.4ML IJ SOSY
40.0000 ug | PREFILLED_SYRINGE | INTRAMUSCULAR | Status: DC
  Filled 2020-01-01: qty 0.4

## 2020-01-01 MED ORDER — LIDOCAINE HCL (PF) 1 % IJ SOLN: 5.0000 mL | INTRAMUSCULAR | Status: DC | PRN

## 2020-01-01 MED ORDER — MIDAZOLAM HCL 2 MG/2ML IJ SOLN
INTRAMUSCULAR | Status: AC | PRN
  Administered 2020-01-01: 1 mg via INTRAVENOUS

## 2020-01-01 MED ORDER — GELATIN ABSORBABLE 12-7 MM EX MISC
CUTANEOUS | Status: AC
  Filled 2020-01-01: qty 1

## 2020-01-01 MED ORDER — SODIUM CHLORIDE 0.9 % IV SOLN: 100.0000 mL | INTRAVENOUS | Status: DC | PRN

## 2020-01-01 MED ORDER — MIDAZOLAM HCL 2 MG/2ML IJ SOLN
INTRAMUSCULAR | Status: AC
  Filled 2020-01-01: qty 2

## 2020-01-01 MED ORDER — PENTAFLUOROPROP-TETRAFLUOROETH EX AERO: 1.0000 "application " | INHALATION_SPRAY | CUTANEOUS | Status: DC | PRN

## 2020-01-01 MED ORDER — HEPARIN SODIUM (PORCINE) 1000 UNIT/ML IJ SOLN
INTRAMUSCULAR | Status: AC
  Administered 2020-01-01: 1000 [IU] via INTRAVENOUS_CENTRAL
  Filled 2020-01-01: qty 4

## 2020-01-01 MED ORDER — LIDOCAINE-PRILOCAINE 2.5-2.5 % EX CREA
1.0000 "application " | TOPICAL_CREAM | CUTANEOUS | Status: DC | PRN
  Filled 2020-01-01: qty 5

## 2020-01-01 MED ORDER — ALTEPLASE 2 MG IJ SOLR
2.0000 mg | Freq: Once | INTRAMUSCULAR | Status: DC | PRN
  Filled 2020-01-01: qty 2

## 2020-01-01 MED ORDER — HEPARIN SODIUM (PORCINE) 1000 UNIT/ML IJ SOLN
INTRAMUSCULAR | Status: AC
  Filled 2020-01-01: qty 1

## 2020-01-01 MED ORDER — CEFAZOLIN SODIUM-DEXTROSE 2-4 GM/100ML-% IV SOLN
2.0000 g | INTRAVENOUS | Status: AC
  Administered 2020-01-01: 2 g via INTRAVENOUS

## 2020-01-01 NOTE — Progress Notes (Signed)
PROGRESS NOTE    [name]  UXN:[id number] DOB: [birth year] DOA: 12/30/2019 PCP: Bartholome Bill, MD   Brief Narrative:  [name] is a 54 y.o. male with medical history significant of chronic kidney disease stage IV, diabetes, hypertension, right leg wound, obstructive sleep apnea on CPAP, hypertensive encephalopathy and adjustment disorder who is being followed by nephrology for advanced renal disease not yet requiring hemodialysis.  Patient was sent from wound care center where he went for his routine appointment today. While there he was noted to have worsening renal function. Patient arrived the ER where his BUN/creatinine were noted to be higher than his baseline.  His renal function showed BUN of 82 and creatinine 10.43.  Previous creatinine was 8.23.  Potassium however is normal at 4.1.  Admitted to the hospital for evaluation by nephrology.  Assessment & Plan:   Principal Problem:   Acute renal failure superimposed on stage 5 chronic kidney disease, not on chronic dialysis (HCC) Active Problems:   CKD (chronic kidney disease), stage V (HCC)   Essential hypertension, malignant   Diabetes mellitus type 2 in nonobese (HCC)   Acute diastolic congestive heart failure (HCC)   Chronic ulcer of right leg (HCC)   AF (paroxysmal atrial fibrillation) (HCC)   Acute on chronic kidney disease stage V - progressing now ESRD, POA:  - Appears to be worsening baseline CKD 5 now progressing to end-stage renal disease - Creatinine around 7-8 over the past few years, currently greater than 10 - Plan for RIJ dialysis catheter placement with subsequent dialysis per nephrology schedule. -AV fistula/graft planned end of October, following with vascular for evaluation as early as next week.  Paroxysmal A. fib:  Currently normal sinus rhythm  Insulin-dependent diabetes type 2, uncontrolled, hyperglycemia, POA  Continue sliding scale insulin, hypoglycemic protocol Home insulin appears to  be 70/30 25 units twice daily Lab Results  Component Value Date   HGBA1C 7.5 (H) 12/30/2019   Malignant hypertension:  - Patient reports outpatient hypotension with cessation or holding of certain home medications which likely caused rebound malignant hypertension as above exacerbating kidney disease - Home regimen includes clonidine patch, Lasix 40 daily, hydralazine 100 every 6 hours -We have restarted patient's home amlodipine and labetalol -we will attempt to avoid clonidine even concern for rebound hypertension as well as avoid any nephrotoxic's given above  Chronic right lower extremity ulcer:  - Appears well-healing, continue wound care.  - Patient complaining of neuropathic pain in this area, will increase gabapentin to 3 times daily - Wound does not appear to be acute or inflamed, would not administer IV narcotics despite patient's specific request  Obstructive sleep apnea:  - Patient reportedly has been on CPAP - offered to use in hospital if patient agreeable  DVT prophylaxis: Heparin Code Status: Full code Family Communication: None present  Status is: Inpatient  Dispo: The patient is from: Home              Anticipated d/c is to: To be determined              Anticipated d/c date is: Likely 4 to 5 days pending clinical course              Patient currently not medically stable for discharge given worsening kidney function, uncontrolled blood pressure need for close monitoring and possible intervention with dialysis  Consultants:   Neprhology: Dr. Daylene Katayama  Procedures:   None  Antimicrobials:  None indicated  Subjective: No acute issues or  events overnight, patient continues to complain of right lower extremity neuropathic pain which appears to be chronic but otherwise denies shortness of breath, chest pain, nausea, vomiting, diarrhea, constipation, headache, fevers, chills.   Objective: Vitals:   01/01/20 0500 01/01/20 0600 01/01/20 0626 01/01/20 0632    BP:    (!) 160/98  Pulse:    88  Resp: 14 14 12    Temp:    98.5 F (36.9 C)  TempSrc:    Oral  SpO2:    95%  Weight: 108.2 kg     Height:       No intake or output data in the 24 hours ending 01/01/20 0742 Filed Weights   12/30/19 1024 12/31/19 2129 01/01/20 0500  Weight: 109.8 kg 108.2 kg 108.2 kg    Examination:  General exam: Appears calm and comfortable  Respiratory system: Clear to auscultation. Respiratory effort normal. Cardiovascular system: S1 & S2 heard, RRR. No JVD, murmurs, rubs, gallops or clicks. No pedal edema. Gastrointestinal system: Abdomen is nondistended, soft and nontender. No organomegaly or masses felt. Normal bowel sounds heard. Central nervous system: Alert and oriented. No focal neurological deficits. Extremities: Symmetric 5 x 5 power. Skin: Right anterior 6 cm x 3 cm chronic wound with granulation tissue, appears well-healed without purulence erythema or discharge Psychiatry: Judgement and insight appear normal. Mood & affect appropriate.     Data Reviewed: I have personally reviewed following labs and imaging studies  CBC: Recent Labs  Lab 12/30/19 1023 12/30/19 1731 12/30/19 1923 12/31/19 0500  WBC 10.7*  --  12.5* 10.3  HGB 9.4* 9.9* 9.3* 9.3*  HCT 30.2* 29.0* 29.4* 29.5*  MCV 90.7  --  92.5 91.9  PLT 440*  --  457* 440*   Basic Metabolic Panel: Recent Labs  Lab 12/30/19 1023 12/30/19 1731 12/30/19 1923 12/31/19 0500 01/01/20 0120  NA 135 137  --  136 135  K 4.1 3.9  --  3.7 3.5  CL 103  --   --  103 103  CO2 16*  --   --  16* 18*  GLUCOSE 184*  --   --  158* 164*  BUN 82*  --   --  85* 88*  CREATININE 10.43*  --  10.49* 10.50* 10.73*  CALCIUM 9.1  --   --  9.0 8.7*  PHOS  --   --   --  7.0* 7.3*   GFR: Estimated Creatinine Clearance: 9.3 mL/min (A) (by C-G formula based on SCr of 10.73 mg/dL (H)). Liver Function Tests: Recent Labs  Lab 12/31/19 0500 01/01/20 0120  ALBUMIN 2.6* 2.4*   No results for input(s):  LIPASE, AMYLASE in the last 168 hours. No results for input(s): AMMONIA in the last 168 hours. Coagulation Profile: No results for input(s): INR, PROTIME in the last 168 hours. Cardiac Enzymes: No results for input(s): CKTOTAL, CKMB, CKMBINDEX, TROPONINI in the last 168 hours. BNP (last 3 results) No results for input(s): PROBNP in the last 8760 hours. HbA1C: Recent Labs    12/30/19 1923  HGBA1C 7.5*   CBG: Recent Labs  Lab 12/31/19 0812 12/31/19 1147 12/31/19 1710 12/31/19 2130 01/01/20 0735  GLUCAP 124* 164* 145* 154* 137*   Lipid Profile: No results for input(s): CHOL, HDL, LDLCALC, TRIG, CHOLHDL, LDLDIRECT in the last 72 hours. Thyroid Function Tests: No results for input(s): TSH, T4TOTAL, FREET4, T3FREE, THYROIDAB in the last 72 hours. Anemia Panel: Recent Labs    12/31/19 2037  FERRITIN 175  TIBC 196*  IRON  60   Sepsis Labs: Recent Labs  Lab 12/30/19 1721  LATICACIDVEN 0.7    Recent Results (from the past 240 hour(s))  SARS Coronavirus 2 by RT PCR (hospital order, performed in [facility] hospital lab) Nasopharyngeal Nasopharyngeal Swab     Status: None   Collection Time: 12/30/19  6:37 PM   Specimen: Nasopharyngeal Swab  Result Value Ref Range Status   SARS Coronavirus 2 NEGATIVE NEGATIVE Final    Comment: (NOTE) SARS-CoV-2 target nucleic acids are NOT DETECTED.  The SARS-CoV-2 RNA is generally detectable in upper and lower respiratory specimens during the acute phase of infection. The lowest concentration of SARS-CoV-2 viral copies this assay can detect is 250 copies / mL. A negative result does not preclude SARS-CoV-2 infection and should not be used as the sole basis for treatment or other patient management decisions.  A negative result may occur with improper specimen collection / handling, submission of specimen other than nasopharyngeal swab, presence of viral mutation(s) within the areas targeted by this assay, and inadequate number of viral  copies (<250 copies / mL). A negative result must be combined with clinical observations, patient history, and epidemiological information.  Fact Sheet for Patients:   StrictlyIdeas.no  Fact Sheet for Healthcare Providers: BankingDealers.co.za  This test is not yet approved or  cleared by the [place] FDA and has been authorized for detection and/or diagnosis of SARS-CoV-2 by FDA under an Emergency Use Authorization (EUA).  This EUA will remain in effect (meaning this test can be used) for the duration of the COVID-19 declaration under Section 564(b)(1) of the Act, 21 U.S.C. section 360bbb-3(b)(1), unless the authorization is terminated or revoked sooner.  Performed at [facility], [place] [address], [place], [place] [address]          Radiology Studies: DG Chest 2 View  Result Date: 12/30/2019 CLINICAL DATA:  Chest pain, right leg swelling EXAM: CHEST - 2 VIEW COMPARISON:  10/25/2019 FINDINGS: Cardiomegaly. No confluent opacities, effusions or edema. No acute bony abnormality. IMPRESSION: Cardiomegaly.  No active disease. Electronically Signed   By: Rolm Baptise M.D.   On: 12/30/2019 10:46   DG Tibia/Fibula Right  Result Date: 12/30/2019 CLINICAL DATA:  Leg wound pain, anterior X 7 months. EXAM: RIGHT TIBIA AND FIBULA - 2 VIEW COMPARISON:  X-ray right tibia fibula 10/25/2019 FINDINGS: There is no evidence of fracture or aggressive appearing focal bone lesions. No cortical erosion or destruction. Similar appearing cortical irregularity of the distal posterior tibial shaft and malleolus likely representing an old healed fracture or possible sessile exostosis. Soft tissues are unremarkable. IMPRESSION: 1. No acute fracture or dislocation of the bones of the right tibia fibula. 2. No radiographic evidence of osteomyelitis. Electronically Signed   By: Iven Finn M.D.   On: 12/30/2019 15:55   CT Head Wo Contrast  Result  Date: 12/30/2019 CLINICAL DATA:  Headache new or worsening. Confusion, slurred speech EXAM: CT HEAD WITHOUT CONTRAST TECHNIQUE: Contiguous axial images were obtained from the base of the skull through the vertex without intravenous contrast. COMPARISON:  CT head 10/25/2019 FINDINGS: Brain: No evidence of acute infarction, hemorrhage, hydrocephalus, extra-axial collection or mass lesion/mass effect. Vascular: Negative for hyperdense vessel Skull: Negative Sinuses/Orbits: Retention cyst left maxillary sinus. Right mastoid effusion, improved from the prior CT. Remaining sinuses clear. Negative orbit. Other: None IMPRESSION: Negative CT head Electronically Signed   By: Franchot Gallo M.D.   On: 12/30/2019 16:27    Scheduled Meds: . amLODipine  10  mg Oral Daily  . calcitRIOL  0.25 mcg Oral Q M,W,F  . Chlorhexidine Gluconate Cloth  6 each Topical Q0600  . gabapentin  300 mg Oral Daily  . heparin  5,000 Units Subcutaneous Q8H  . insulin aspart  0-5 Units Subcutaneous QHS  . insulin aspart  0-6 Units Subcutaneous TID WC  . labetalol  200 mg Oral BID  . sevelamer carbonate  800 mg Oral TID WC    LOS: 2 days   Time spent: 54min  [name] Franklin C Starlin Steib, DO [facility]  If 7PM-7AM, please contact night-coverage [email or web address]  01/01/2020, 7:42 AM

## 2020-01-01 NOTE — Procedures (Signed)
Interventional Radiology Procedure Note ? ?Procedure: RT IJ HD CATH   ? ?Complications: None ? ?Estimated Blood Loss:  MIN ? ?Findings: ?TIP SVCRA   ? ?M. TREVOR Mattheu Brodersen, MD ? ? ? ?

## 2020-01-01 NOTE — Progress Notes (Signed)
Hermans, ZAHEER ([id number]) Visit Report for 12/30/2019 Arrival Information Details Patient Name: Date of Service: [name], [name] 12/30/2019 8:15 A M Medical Record Number: [id number] Patient Account Number: [id number] Date of Birth/Sex: Treating RN: 26-Jul-1965 (54 y.o. [name] Primary Care [name]: [name] Other Clinician: Referring [name]: Treating [name]/Extender: [name] Weeks in Treatment: 21 Visit Information History Since Last Visit Added or deleted any medications: No Patient Arrived: [name] Any new allergies or adverse reactions: No Arrival Time: 08:34 Had a fall or experienced change in No Accompanied By: mother activities of daily living that may affect Transfer Assistance: None risk of falls: Patient Identification Verified: Yes Signs or symptoms of abuse/neglect since last visito No Secondary Verification Process Completed: Yes Hospitalized since last visit: No Patient Requires Transmission-Based Precautions: No Implantable device outside of the clinic excluding No Patient Has Alerts: No cellular tissue based products placed in the center since last visit: Has Dressing in Place as Prescribed: Yes Pain Present Now: Yes Electronic Signature(s) Signed: 12/31/2019 8:15:52 AM By: [name] Entered By: [name] on 12/30/2019 08:36:38 -------------------------------------------------------------------------------- Clinic Level of Care Assessment Details Patient Name: Date of Service: [name], [name] 12/30/2019 8:15 A M Medical Record Number: [id number] Patient Account Number: [id number] Date of Birth/Sex: Treating RN: 1966/03/12 (54 y.o. [name] Primary Care Keilly Fatula: [name] Other Clinician: Referring [name]: Treating [name]/Extender: [name], [name] Weeks in Treatment: 21 Clinic Level of Care Assessment Items TOOL 4 Quantity Score _0  - 0 Use when only an EandM is performed on  FOLLOW-UP visit ASSESSMENTS - Nursing Assessment / Reassessment X- 1 10 Reassessment of [name]-morbidities (includes updates in patient status) X- 1 5 Reassessment of Adherence to Treatment Plan ASSESSMENTS - Wound and Skin A ssessment / Reassessment _1  - 0 Simple Wound Assessment / Reassessment - one wound X- 2 5 Complex Wound Assessment / Reassessment - multiple wounds _2  - 0 Dermatologic / Skin Assessment (not related to wound area) ASSESSMENTS - Focused Assessment X- 1 5 Circumferential Edema Measurements - multi extremities _3  - 0 Nutritional Assessment / Counseling / Intervention X- 1 5 Lower Extremity Assessment (monofilament, tuning fork, pulses) _4  - 0 Peripheral Arterial Disease Assessment (using hand held doppler) ASSESSMENTS - Ostomy and/or Continence Assessment and Care _5  - 0 Incontinence Assessment and Management _6  - 0 Ostomy Care Assessment and Management (repouching, etc.) PROCESS - Coordination of Care X - Simple Patient / Family Education for ongoing care 1 15 _7  - 0 Complex (extensive) Patient / Family Education for ongoing care X- 1 10 Staff obtains Consents, Records, T Results / Process Orders est _8  - 0 Staff telephones HHA, Nursing Homes / Clarify orders / etc _9  - 0 Routine Transfer to another Facility (non-emergent condition) X- 1 10 Routine Hospital Admission (non-emergent condition) _10  - 0 New Admissions / Biomedical engineer / Ordering NPWT Apligraf, etc. , _11  - 0 Emergency Hospital Admission (emergent condition) X- 1 10 Simple Discharge Coordination _12  - 0 Complex (extensive) Discharge Coordination PROCESS - Special Needs _13  - 0 Pediatric / Minor Patient Management _14  - 0 Isolation Patient Management _15  - 0 Hearing / Language / Visual special needs _16  - 0 Assessment of Community assistance (transportation, D/C planning, etc.) _17  - 0 Additional assistance / Altered mentation _18  - 0 Support Surface(s) Assessment (bed, cushion,  seat, etc.) INTERVENTIONS - Wound Cleansing / Measurement _19  - 0 Simple Wound Cleansing - one wound X- 2 5 Complex Wound Cleansing - multiple wounds X-  1 5 Wound Imaging (photographs - any number of wounds) _0  - 0 Wound Tracing (instead of photographs) _1  - 0 Simple Wound Measurement - one wound X- 2 5 Complex Wound Measurement - multiple wounds INTERVENTIONS - Wound Dressings X - Small Wound Dressing one or multiple wounds 1 10 _2  - 0 Medium Wound Dressing one or multiple wounds _3  - 0 Large Wound Dressing one or multiple wounds X- 1 5 Application of Medications - topical <[id number]>_7<\/[id number]>_7  - 0 Application of Medications - injection INTERVENTIONS - Miscellaneous _5  - 0 External ear exam _6  - 0 Specimen Collection (cultures, biopsies, blood, body fluids, etc.) _7  - 0 Specimen(s) / Culture(s) sent or taken to Lab for analysis _8  - 0 Patient Transfer (multiple staff / Civil Service fast streamer / Similar devices) _9  - 0 Simple Staple / Suture removal (25 or less) _10  - 0 Complex Staple / Suture removal (26 or more) _11  - 0 Hypo / Hyperglycemic Management (close monitor of Blood Glucose) _12  - 0 Ankle / Brachial Index (ABI) - do not check if billed separately X- 1 5 Vital Signs Has the patient been seen at the hospital within the last three years: Yes Total Score: 125 Level Of Care: New/Established - Level 4 Electronic Signature(s) Signed: 12/30/2019 5:11:27 PM By: Baruch Gouty RN, BSN Entered By: Baruch Gouty on 12/30/2019 09:09:57 -------------------------------------------------------------------------------- Lower Extremity Assessment Details Patient Name: Date of Service: [name], [name] 12/30/2019 8:15 A M Medical Record Number: [id number] Patient Account Number: [id number] Date of Birth/Sex: Treating RN: August 06, 1965 (54 y.o. [name] Primary Care Venie Montesinos: [name] Other Clinician: Referring Munir Victorian: Treating Jacobey Gura/Extender: [name], [name] Weeks in Treatment:  21 Edema Assessment Assessed: [Left: No] [Right: No] Edema: [Left: Ye] [Right: s] Calf Left: Right: Point of Measurement: 31 cm From Medial Instep cm 44 cm Ankle Left: Right: Point of Measurement: 8 cm From Medial Instep cm 24 cm Electronic Signature(s) Signed: 01/01/2020 4:51:09 PM By: [name] RN Entered By: [name] on 12/30/2019 08:46:08 -------------------------------------------------------------------------------- [place] Details Patient Name: Date of Service: [name], [name] 12/30/2019 8:15 A M Medical Record Number: [id number] Patient Account Number: [id number] Date of Birth/Sex: Treating RN: 05-02-1965 (54 y.o. [name] Primary Care Kimimila Tauzin: [name] Other Clinician: Referring Louella Medaglia: Treating Orman Matsumura/Extender: [name], [name] Weeks in Treatment: 21 Active Inactive Venous Leg Ulcer Nursing Diagnoses: Knowledge deficit related to disease process and management Potential for venous Insuffiency (use before diagnosis confirmed) Goals: Patient will maintain optimal edema control Date Initiated: 09/09/2019 Target Resolution Date: 01/27/2020 Goal Status: Active Patient/caregiver will verbalize understanding of disease process and disease management Date Initiated: 09/09/2019 Date Inactivated: 10/14/2019 Target Resolution Date: 10/07/2019 Goal Status: Met Interventions: Assess peripheral edema status every visit. Compression as ordered Provide education on venous insufficiency Treatment Activities: Therapeutic compression applied : 09/09/2019 Notes: Wound/Skin Impairment Nursing Diagnoses: Knowledge deficit related to ulceration/compromised skin integrity Goals: Patient/caregiver will verbalize understanding of skin care regimen Date Initiated: 08/04/2019 Target Resolution Date: 01/27/2020 Goal Status: Active Ulcer/skin breakdown will have a volume reduction of 30% by week 4 Date Initiated: 08/04/2019 Date  Inactivated: 09/09/2019 Target Resolution Date: 09/11/2019 Goal Status: Met Ulcer/skin breakdown will have a volume reduction of 50% by week 8 Date Initiated: 09/09/2019 Date Inactivated: 10/14/2019 Target Resolution Date: 10/07/2019 Goal Status: Unmet Unmet Reason: awaiting reflux studies Ulcer/skin breakdown will have a volume reduction of 80% by week 12 Date Initiated: 10/14/2019 Date Inactivated: 11/18/2019 Target Resolution Date: 11/04/2019 Goal Status: Unmet Unmet Reason:  uncontrolled htn Interventions: Assess patient/caregiver ability to obtain necessary supplies Assess patient/caregiver ability to perform ulcer/skin care regimen upon admission and as needed Assess ulceration(s) every visit Notes: Electronic Signature(s) Signed: 12/30/2019 5:11:27 PM By: Baruch Gouty RN, BSN Entered By: Baruch Gouty on 12/30/2019 08:58:55 -------------------------------------------------------------------------------- Pain Assessment Details Patient Name: Date of Service: [name], [name] 12/30/2019 8:15 A M Medical Record Number: [id number] Patient Account Number: [id number] Date of Birth/Sex: Treating RN: 01/11/1966 (55 y.o. [name] Primary Care Destyn Parfitt: [name] Other Clinician: Referring Meghna Hagmann: Treating Lot Medford/Extender: [name], [name] Weeks in Treatment: 21 Active Problems Location of Pain Severity and Description of Pain Patient Has Paino Yes Site Locations Rate the pain. Rate the pain. Current Pain Level: 10 Pain Management and Medication Current Pain Management: Electronic Signature(s) Signed: 12/30/2019 5:11:27 PM By: Baruch Gouty RN, BSN Signed: 12/31/2019 8:15:52 AM By: [name] Entered By: [name] on 12/30/2019 08:37:14 -------------------------------------------------------------------------------- Patient/Caregiver Education Details Patient Name: Date of Service: [name], [name] 9/22/2021andnbsp8:15 A M Medical Record  Number: [id number] Patient Account Number: [id number] Date of Birth/Gender: Treating RN: June 12, 1965 (54 y.o. [name] Primary Care Physician: [name] Other Clinician: Referring Physician: Treating Physician/Extender: Burnard Hawthorne in Treatment: 21 Education Assessment Education Provided To: Patient Education Topics Provided Infection: Methods: Explain/Verbal Responses: Reinforcements needed, State content correctly Pain: Methods: Explain/Verbal Responses: Reinforcements needed, State content correctly Venous: Methods: Explain/Verbal Responses: Reinforcements needed, State content correctly Wound/Skin Impairment: Methods: Explain/Verbal Responses: Reinforcements needed, State content correctly Electronic Signature(s) Signed: 12/30/2019 5:11:27 PM By: Baruch Gouty RN, BSN Entered By: Baruch Gouty on 12/30/2019 09:00:58 -------------------------------------------------------------------------------- Wound Assessment Details Patient Name: Date of Service: [name], [name] 12/30/2019 8:15 A M Medical Record Number: [id number] Patient Account Number: [id number] Date of Birth/Sex: Treating RN: [birth year] (55 y.o. [name] Primary Care Omolara Carol: [name] Other Clinician: Referring Brooklyn Alfredo: Treating Floyd Lusignan/Extender: [name], [name] Weeks in Treatment: 21 Wound Status Wound Number: 1 Primary Diabetic Wound/Ulcer of the Lower Extremity Etiology: Wound Location: Right, Lateral Lower Leg Wound Open Wounding Event: Trauma Status: Date Acquired: 04/10/2019 Comorbid Lymphedema, Sleep Apnea, Arrhythmia, Congestive Heart Failure, Weeks Of Treatment: 21 History: Hypertension, Peripheral Arterial Disease, Type II Diabetes Clustered Wound: No Photos Photo Uploaded By: Mikeal Hawthorne on 12/31/2019 11:32:25 Wound Measurements Length: (cm) Width: (cm) Depth: (cm) Area: (cm) Volume: (cm) 0 % Reduction in Area: 100% 0  % Reduction in Volume: 100% 0 Epithelialization: Large (67-100%) 0 Tunneling: No 0 Undermining: No Wound Description Classification: Grade 2 Wound Margin: Flat and Intact Exudate Amount: None Present Foul Odor After Cleansing: No Slough/Fibrino No Wound Bed Granulation Amount: None Present (0%) Exposed Structure Necrotic Amount: None Present (0%) Fascia Exposed: No Fat Layer (Subcutaneous Tissue) Exposed: Yes Tendon Exposed: No Muscle Exposed: No Joint Exposed: No Bone Exposed: No Electronic Signature(s) Signed: 12/30/2019 5:11:27 PM By: Baruch Gouty RN, BSN Signed: 01/01/2020 4:51:09 PM By: [name] RN Entered By: [name] on 12/30/2019 08:46:34 -------------------------------------------------------------------------------- Wound Assessment Details Patient Name: Date of Service: [name], [name] 12/30/2019 8:15 A M Medical Record Number: [id number] Patient Account Number: [id number] Date of Birth/Sex: Treating RN: May 17, 1965 (54 y.o. [name] Primary Care Shonette Rhames: [name] Other Clinician: Referring Kingson Lohmeyer: Treating Jovon Streetman/Extender: [name], [name] Weeks in Treatment: 21 Wound Status Wound Number: 2 Primary Venous Leg Ulcer Etiology: Wound Location: Right, Anterior Lower Leg Wound Open Wounding Event: Trauma Status: Date Acquired: 11/30/2019 Comorbid Lymphedema, Sleep Apnea, Arrhythmia, Congestive  Heart Failure, Weeks Of Treatment: 4 History: Hypertension, Peripheral Arterial Disease, Type II Diabetes Clustered Wound: No Photos Photo Uploaded By: Mikeal Hawthorne on 12/31/2019 11:32:26 Wound Measurements Length: (cm) 8.6 Width: (cm) 4 Depth: (cm) 0.1 Area: (cm) 27.018 Volume: (cm) 2.702 % Reduction in Area: -411.9% % Reduction in Volume: -411.7% Epithelialization: None Tunneling: No Undermining: No Wound Description Classification: Full Thickness Without Exposed Support Structures Wound Margin: Flat and  Intact Exudate Amount: Medium Exudate Type: Serosanguineous Exudate Color: red, brown Foul Odor After Cleansing: No Slough/Fibrino Yes Wound Bed Granulation Amount: Small (1-33%) Exposed Structure Granulation Quality: Pink Fascia Exposed: No Necrotic Amount: Large (67-100%) Fat Layer (Subcutaneous Tissue) Exposed: Yes Necrotic Quality: Adherent Slough Tendon Exposed: No Muscle Exposed: No Joint Exposed: No Bone Exposed: No Electronic Signature(s) Signed: 12/30/2019 5:11:27 PM By: Baruch Gouty RN, BSN Signed: 01/01/2020 4:51:09 PM By: [name] RN Entered By: [name] on 12/30/2019 08:47:04 -------------------------------------------------------------------------------- Vitals Details Patient Name: Date of Service: [name], [name] 12/30/2019 8:15 A M Medical Record Number: [id number] Patient Account Number: [id number] Date of Birth/Sex: Treating RN: Jan 15, 1966 (54 y.o. [name] Primary Care Elius Etheredge: [name] Other Clinician: Referring Teauna Dubach: Treating Mercie Balsley/Extender: [name], [name] Weeks in Treatment: 21 Vital Signs Time Taken: 08:36 Temperature (F): 97.7 Height (in): 66 Pulse (bpm): 78 Weight (lbs): 256 Respiratory Rate (breaths/min): 18 Body Mass Index (BMI): 41.3 Blood Pressure (mmHg): 110/73 Capillary Blood Glucose (mg/dl): 227 Reference Range: 80 - 120 mg / dl Electronic Signature(s) Signed: 12/31/2019 8:15:52 AM By: [name] Entered By: [name] on 12/30/2019 08:36:57

## 2020-01-01 NOTE — Progress Notes (Signed)
Pt came back from IR alert and diet was order.

## 2020-01-01 NOTE — Consult Note (Signed)
Chief Complaint: Patient was seen in consultation today for ESRD/tunneled HD catheter placement.  Referring Physician(s): Claudia Desanctis (nephrology)  Supervising Physician: Jacqulynn Cadet  Patient Status: [facility]  History of Present Illness: [name] is a 54 y.o. male with a past medical history of hypertension with associated encephalopathy, CKD, diabetes mellitus, chronic RLE wound, OSA on CPAP, obesity, and adjustment disorder. He presented to [facility] ED 12/30/2019 after being referred by his wound care center for evaluation of generalized weakness with associated dyspnea and chest pain. In ED, renal function showed BUN 82 and creatinine 10.43 (up from 8.23). He was admitted for further management. Nephrology was consulted who recommended initiation of HD at this time, along with IR consult for possible tunneled HD catheter placement.  IR consulted by Dr. Royce Macadamia for possible image-guided tunneled HD catheter placement. Patient awake and alert sitting in bed with no complaints at this time. Denies fever, chills, chest pain, dyspnea, abdominal pain, or headache.   Past Medical History:  Diagnosis Date  . Acute kidney injury ([place]) 12/28/2014  . Adjustment disorder with mixed anxiety and depressed mood 12/28/2014  . CPAP (continuous positive airway pressure) dependence   . Diabetes mellitus without complication ([place])   . DM (diabetes mellitus) type II controlled with renal manifestation ([place]) 12/28/2014  . DM (diabetes mellitus) type II controlled, neurological manifestation ([place]) 12/28/2014  . Encephalopathy, hypertensive 12/28/2014  . Hypertension   . Hypertensive emergency without congestive heart failure 12/27/2014  . Possible Panic disorder 12/28/2014  . Renal disorder   . 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 Sleep apnea     Past Surgical History:  Procedure Laterality Date  . EMPYEMA DRAINAGE Right 05/17/2017   Procedure: EMPYEMA DRAINAGE AND DECORTICATION;  Surgeon:  Grace Isaac, MD;  Location: [place];  Service: Thoracic;  Laterality: Right;  . FLEXIBLE BRONCHOSCOPY  05/17/2017   Procedure: FLEXIBLE BRONCHOSCOPY;  Surgeon: Grace Isaac, MD;  Location: [place];  Service: Thoracic;;  . IR THORACENTESIS ASP PLEURAL SPACE W/IMG GUIDE  05/17/2017  . NO PAST SURGERIES    . THORACOTOMY/LOBECTOMY Right 05/17/2017   Procedure: RIGHT MINI THORACOTOMY;  Surgeon: Grace Isaac, MD;  Location: [place];  Service: Thoracic;  Laterality: Right;  Marland Kitchen VIDEO ASSISTED THORACOSCOPY (VATS)/EMPYEMA Right 05/17/2017   Procedure: RIGHT VIDEO ASSISTED THORACOSCOPY (VATS)/EMPYEMA;  Surgeon: Grace Isaac, MD;  Location: [place];  Service: Thoracic;  Laterality: Right;    Allergies: Patient has no known allergies.  Medications: Prior to Admission medications   Medication Sig Start Date End Date Taking? Authorizing Provider  acetaminophen (TYLENOL) 500 MG tablet Take 1,000-1,500 mg by mouth 2 (two) times daily as needed (pain).   Yes [provider]  albuterol (PROVENTIL HFA;VENTOLIN HFA) 108 (90 BASE) MCG/ACT inhaler Inhale 2 puffs into the lungs every 6 (six) hours as needed for wheezing or shortness of breath.    Yes [provider]  amLODipine (NORVASC) 10 MG tablet Take 1 tablet (10 mg total) by mouth daily. 12/29/14  Yes Smiley Houseman, MD  apixaban (ELIQUIS) 5 MG TABS tablet Take 5 mg by mouth 2 (two) times daily.   Yes [provider]  cloNIDine (CATAPRES - DOSED IN MG/24 HR) 0.2 mg/24hr patch Place 1 patch (0.2 mg total) onto the skin once a week. Patient taking differently: Place 0.2 mg onto the skin every Saturday.  05/22/17  Yes Orson Eva J, DO  fluticasone (FLONASE) 50 MCG/ACT nasal spray Place 1 spray into both nostrils  2 (two) times daily. 10/06/19  Yes [provider]  furosemide (LASIX) 80 MG tablet Take 120 mg by mouth daily.   Yes [provider]  gabapentin (NEURONTIN) 300 MG capsule Take 1 capsule (300 mg  total) by mouth daily. 10/28/19  Yes Georgette Shell, MD  hydrALAZINE (APRESOLINE) 100 MG tablet Take 1 tablet (100 mg total) by mouth 4 (four) times daily. 10/26/19  Yes Georgette Shell, MD  HYDROcodone-acetaminophen (NORCO) 7.5-325 MG tablet Take 1 tablet by mouth every 4 (four) hours as needed. 12/11/19  Yes [provider]  insulin NPH-regular Human (70-30) 100 UNIT/ML injection Inject 25 Units into the skin 2 (two) times daily before a meal.    Yes [provider]  labetalol (NORMODYNE) 200 MG tablet Take 200 mg by mouth 2 (two) times daily.  12/14/15  Yes [provider]  levocetirizine (XYZAL) 5 MG tablet Take 5 mg by mouth at bedtime. 10/06/19  Yes [provider]  Multiple Vitamin (MULTIVITAMIN WITH MINERALS) TABS tablet Take 1 tablet by mouth daily. Centrum   Yes [provider]  potassium chloride SA (K-DUR,KLOR-CON) 20 MEQ tablet Take 20 mEq by mouth every morning.    Yes [provider]  tiZANidine (ZANAFLEX) 4 MG tablet Take 4 mg by mouth every 8 (eight) hours as needed for muscle spasms.  04/08/17  Yes [provider]  traMADol (ULTRAM) 50 MG tablet Take 50 mg by mouth 3 (three) times daily as needed (pain).  08/17/19  Yes [provider]  Vitamin D, Ergocalciferol, (DRISDOL) 1.25 MG (50000 UNIT) CAPS capsule Take 50,000 Units by mouth every Sunday. 08/17/19  Yes [provider]     Family History  Problem Relation Age of Onset  . Hypertension Mother     Social History   Socioeconomic History  . Marital status: Married    Spouse name: Not on file  . Number of children: Not on file  . Years of education: Not on file  . Highest education level: Not on file  Occupational History  . Not on file  Tobacco Use  . Smoking status: Never Smoker  . Smokeless tobacco: Never Used  Vaping Use  . Vaping Use: Never used  Substance and Sexual Activity  . Alcohol use: No  . Drug use: No  . Sexual  activity: Not on file  Other Topics Concern  . Not on file  Social History Narrative  . Not on file   Social Determinants of Health   Financial Resource Strain:   . Difficulty of Paying Living Expenses: Not on file  Food Insecurity:   . Worried About Charity fundraiser in the Last Year: Not on file  . Ran Out of Food in the Last Year: Not on file  Transportation Needs:   . Lack of Transportation (Medical): Not on file  . Lack of Transportation (Non-Medical): Not on file  Physical Activity:   . Days of Exercise per Week: Not on file  . Minutes of Exercise per Session: Not on file  Stress:   . Feeling of Stress : Not on file  Social Connections:   . Frequency of Communication with Friends and Family: Not on file  . Frequency of Social Gatherings with Friends and Family: Not on file  . Attends Religious Services: Not on file  . Active Member of Clubs or Organizations: Not on file  . Attends Archivist Meetings: Not on file  . Marital Status: Not on file  Review of Systems: A 12 point ROS discussed and pertinent positives are indicated in the HPI above.  All other systems are negative.  Review of Systems  Constitutional: Negative for chills and fever.  Respiratory: Negative for shortness of breath and wheezing.   Cardiovascular: Negative for chest pain and palpitations.  Gastrointestinal: Negative for abdominal pain.  Neurological: Negative for headaches.  Psychiatric/Behavioral: Negative for behavioral problems and confusion.    Vital Signs: BP (!) 173/103 (BP Location: Right Arm)   Pulse 88   Temp 97.9 F (36.6 C)   Resp 20   Ht _0  (1.702 m)   Wt 238 lb 8.6 oz (108.2 kg)   SpO2 91%   BMI 37.36 kg/m   Physical Exam Vitals and nursing note reviewed.  Constitutional:      General: He is not in acute distress.    Appearance: Normal appearance.  Cardiovascular:     Rate and Rhythm: Normal rate and regular rhythm.     Heart sounds: Normal heart  sounds. No murmur heard.   Pulmonary:     Effort: Pulmonary effort is normal. No respiratory distress.     Breath sounds: Normal breath sounds. No wheezing.  Skin:    General: Skin is warm and dry.  Neurological:     Mental Status: He is alert and oriented to person, place, and time.      MD Evaluation Airway: WNL Heart: WNL Abdomen: WNL Chest/ Lungs: WNL ASA  Classification: 3 Mallampati/Airway Score: Two   Imaging: DG Chest 2 View  Result Date: 12/30/2019 CLINICAL DATA:  Chest pain, right leg swelling EXAM: CHEST - 2 VIEW COMPARISON:  10/25/2019 FINDINGS: Cardiomegaly. No confluent opacities, effusions or edema. No acute bony abnormality. IMPRESSION: Cardiomegaly.  No active disease. Electronically Signed   By: Rolm Baptise M.D.   On: 12/30/2019 10:46   DG Tibia/Fibula Right  Result Date: 12/30/2019 CLINICAL DATA:  Leg wound pain, anterior X 7 months. EXAM: RIGHT TIBIA AND FIBULA - 2 VIEW COMPARISON:  X-ray right tibia fibula 10/25/2019 FINDINGS: There is no evidence of fracture or aggressive appearing focal bone lesions. No cortical erosion or destruction. Similar appearing cortical irregularity of the distal posterior tibial shaft and malleolus likely representing an old healed fracture or possible sessile exostosis. Soft tissues are unremarkable. IMPRESSION: 1. No acute fracture or dislocation of the bones of the right tibia fibula. 2. No radiographic evidence of osteomyelitis. Electronically Signed   By: Iven Finn M.D.   On: 12/30/2019 15:55   CT Head Wo Contrast  Result Date: 12/30/2019 CLINICAL DATA:  Headache new or worsening. Confusion, slurred speech EXAM: CT HEAD WITHOUT CONTRAST TECHNIQUE: Contiguous axial images were obtained from the base of the skull through the vertex without intravenous contrast. COMPARISON:  CT head 10/25/2019 FINDINGS: Brain: No evidence of acute infarction, hemorrhage, hydrocephalus, extra-axial collection or mass lesion/mass effect.  Vascular: Negative for hyperdense vessel Skull: Negative Sinuses/Orbits: Retention cyst left maxillary sinus. Right mastoid effusion, improved from the prior CT. Remaining sinuses clear. Negative orbit. Other: None IMPRESSION: Negative CT head Electronically Signed   By: Franchot Gallo M.D.   On: 12/30/2019 16:27    Labs:  CBC: Recent Labs    10/27/19 0508 10/27/19 0508 12/30/19 1023 12/30/19 1731 12/30/19 1923 12/31/19 0500  WBC 7.8  --  10.7*  --  12.5* 10.3  HGB 10.9*   < > 9.4* 9.9* 9.3* 9.3*  HCT 34.1*   < > 30.2* 29.0* 29.4* 29.5*  PLT 330  --  440*  --  457* 470*   < > = values in this interval not displayed.    COAGS: No results for input(s): INR, APTT in the last 8760 hours.  BMP: Recent Labs    10/27/19 0508 10/27/19 0508 12/30/19 1023 12/30/19 1731 12/30/19 1923 12/31/19 0500 01/01/20 0120  NA 136   < > 135 137  --  136 135  K 3.8   < > 4.1 3.9  --  3.7 3.5  CL 106  --  103  --   --  103 103  CO2 20*  --  16*  --   --  16* 18*  GLUCOSE 179*  --  184*  --   --  158* 164*  BUN 64*  --  82*  --   --  85* 88*  CALCIUM 9.0  --  9.1  --   --  9.0 8.7*  CREATININE 8.23*   < > 10.43*  --  10.49* 10.50* 10.73*  GFRNONAA 7*   < > 5*  --  5* 5* 5*  GFRAA 8*   < > 6*  --  6* 6* 6*   < > = values in this interval not displayed.    LIVER FUNCTION TESTS: Recent Labs    12/31/19 0500 01/01/20 0120  ALBUMIN 2.6* 2.4*     Assessment and Plan:  ESRD in need of HD initiation. Plan for image-guided tunneled HD catheter placement in IR today pending IR scheduling. Patient is NPO. Afebrile.  Risks and benefits discussed with the patient including, but not limited to bleeding, infection, vascular injury, pneumothorax which may require chest tube placement, air embolism or even death. All of the patient's questions were answered, patient is agreeable to proceed. Consent signed and in IR control room.   Thank you for this interesting consult.  I greatly enjoyed  meeting [name] and look forward to participating in their care.  A copy of this report was sent to the requesting provider on this date.  Electronically Signed: Earley Abide, PA-C 01/01/2020, 10:12 AM   I spent a total of 20 Minutes in face to face in clinical consultation, greater than 50% of which was counseling/coordinating care for ESRD/tunneled HD catheter placement.

## 2020-01-01 NOTE — Progress Notes (Signed)
Patient ID: [name], male   DOB: 09/18/1965, 54 y.o.   MRN: [id number] S: No new complaints this am O:BP (!) 173/103 (BP Location: Right Arm)   Pulse 88   Temp 97.9 F (36.6 C)   Resp 20   Ht 5\' 7"  (1.702 m)   Wt 108.2 kg   SpO2 91%   BMI 37.36 kg/m  No intake or output data in the 24 hours ending 01/01/20 1000 Intake/Output: I/O last 3 completed shifts: In: 400 [P.O.:400] Out: 650 [Urine:650]  Intake/Output this shift:  No intake/output data recorded. Weight change: -1.571 kg Gen: NAD, flat affect CVS: RRR, no rub Resp: cta Abd: +BS, soft Nt/ND Ext: no edema  Recent Labs  Lab 12/30/19 1023 12/30/19 1731 12/30/19 1923 12/31/19 0500 01/01/20 0120  NA 135 137  --  136 135  K 4.1 3.9  --  3.7 3.5  CL 103  --   --  103 103  CO2 16*  --   --  16* 18*  GLUCOSE 184*  --   --  158* 164*  BUN 82*  --   --  85* 88*  CREATININE 10.43*  --  10.49* 10.50* 10.73*  ALBUMIN  --   --   --  2.6* 2.4*  CALCIUM 9.1  --   --  9.0 8.7*  PHOS  --   --   --  7.0* 7.3*   Liver Function Tests: Recent Labs  Lab 12/31/19 0500 01/01/20 0120  ALBUMIN 2.6* 2.4*   No results for input(s): LIPASE, AMYLASE in the last 168 hours. No results for input(s): AMMONIA in the last 168 hours. CBC: Recent Labs  Lab 12/30/19 1023 12/30/19 1023 12/30/19 1731 12/30/19 1923 12/31/19 0500  WBC 10.7*  --   --  12.5* 10.3  HGB 9.4*   < > 9.9* 9.3* 9.3*  HCT 30.2*   < > 29.0* 29.4* 29.5*  MCV 90.7  --   --  92.5 91.9  PLT 440*  --   --  457* 470*   < > = values in this interval not displayed.   Cardiac Enzymes: No results for input(s): CKTOTAL, CKMB, CKMBINDEX, TROPONINI in the last 168 hours. CBG: Recent Labs  Lab 12/31/19 0812 12/31/19 1147 12/31/19 1710 12/31/19 2130 01/01/20 0735  GLUCAP 124* 164* 145* 154* 137*    Iron Studies:  Recent Labs    12/31/19 2037  IRON 60  TIBC 196*  FERRITIN 175   Studies/Results: DG Chest 2 View  Result Date: 12/30/2019 CLINICAL DATA:   Chest pain, right leg swelling EXAM: CHEST - 2 VIEW COMPARISON:  10/25/2019 FINDINGS: Cardiomegaly. No confluent opacities, effusions or edema. No acute bony abnormality. IMPRESSION: Cardiomegaly.  No active disease. Electronically Signed   By: Rolm Baptise M.D.   On: 12/30/2019 10:46   DG Tibia/Fibula Right  Result Date: 12/30/2019 CLINICAL DATA:  Leg wound pain, anterior X 7 months. EXAM: RIGHT TIBIA AND FIBULA - 2 VIEW COMPARISON:  X-ray right tibia fibula 10/25/2019 FINDINGS: There is no evidence of fracture or aggressive appearing focal bone lesions. No cortical erosion or destruction. Similar appearing cortical irregularity of the distal posterior tibial shaft and malleolus likely representing an old healed fracture or possible sessile exostosis. Soft tissues are unremarkable. IMPRESSION: 1. No acute fracture or dislocation of the bones of the right tibia fibula. 2. No radiographic evidence of osteomyelitis. Electronically Signed   By: Iven Finn M.D.   On: 12/30/2019 15:55   CT Head Wo  Contrast  Result Date: 12/30/2019 CLINICAL DATA:  Headache new or worsening. Confusion, slurred speech EXAM: CT HEAD WITHOUT CONTRAST TECHNIQUE: Contiguous axial images were obtained from the base of the skull through the vertex without intravenous contrast. COMPARISON:  CT head 10/25/2019 FINDINGS: Brain: No evidence of acute infarction, hemorrhage, hydrocephalus, extra-axial collection or mass lesion/mass effect. Vascular: Negative for hyperdense vessel Skull: Negative Sinuses/Orbits: Retention cyst left maxillary sinus. Right mastoid effusion, improved from the prior CT. Remaining sinuses clear. Negative orbit. Other: None IMPRESSION: Negative CT head Electronically Signed   By: Franchot Gallo M.D.   On: 12/30/2019 16:27   . amLODipine  10 mg Oral Daily  . calcitRIOL  0.25 mcg Oral Q M,W,F  . Chlorhexidine Gluconate Cloth  6 each Topical Q0600  . gabapentin  300 mg Oral Daily  . heparin  5,000 Units  Subcutaneous Q8H  . insulin aspart  0-5 Units Subcutaneous QHS  . insulin aspart  0-6 Units Subcutaneous TID WC  . labetalol  200 mg Oral BID  . sevelamer carbonate  800 mg Oral TID WC    BMET    Component Value Date/Time   NA 135 01/01/2020 0120   K 3.5 01/01/2020 0120   CL 103 01/01/2020 0120   CO2 18 (L) 01/01/2020 0120   GLUCOSE 164 (H) 01/01/2020 0120   BUN 88 (H) 01/01/2020 0120   CREATININE 10.73 (H) 01/01/2020 0120   CALCIUM 8.7 (L) 01/01/2020 0120   GFRNONAA 5 (L) 01/01/2020 0120   GFRAA 6 (L) 01/01/2020 0120   CBC    Component Value Date/Time   WBC 10.3 12/31/2019 0500   RBC 3.21 (L) 12/31/2019 0500   HGB 9.3 (L) 12/31/2019 0500   HCT 29.5 (L) 12/31/2019 0500   PLT 470 (H) 12/31/2019 0500   MCV 91.9 12/31/2019 0500   MCH 29.0 12/31/2019 0500   MCHC 31.5 12/31/2019 0500   RDW 14.5 12/31/2019 0500   LYMPHSABS 2.2 07/27/2017 1721   MONOABS 0.2 07/27/2017 1721   EOSABS 0.0 07/27/2017 1721   BASOSABS 0.0 07/27/2017 1721     Assessment/Plan:  1. 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  Plan for [facility] placement today and initiation of dialysis.  Will need [facility] eval for AVF/AVG and CLIP for outpatient HD arrangements. 2. HTN- follow after UF with HD. 3. Chronic diastolic CHF- euvolemic on exam, cont beta-blocker 4. Metabolic acidosis due to #1 follow after HD 5. Anemia of CKD stage V- will start ESA and follow iron stores. 6. Vascular access- was set up for AVF/AVg at end of October but will need one next week according to OR schedule with [facility]. 7. Secondary HPTH- on renvela and calcitriol 8. Chest pain- per primary 9. PAfib 10. OSA on CPAP  Donetta Potts, MD [employer] [phone]

## 2020-01-02 DIAGNOSIS — E119 Type 2 diabetes mellitus without complications: Secondary | ICD-10-CM

## 2020-01-02 DIAGNOSIS — I739 Peripheral vascular disease, unspecified: Secondary | ICD-10-CM

## 2020-01-02 DIAGNOSIS — N186 End stage renal disease: Secondary | ICD-10-CM

## 2020-01-02 DIAGNOSIS — I10 Essential (primary) hypertension: Secondary | ICD-10-CM

## 2020-01-02 DIAGNOSIS — N185 Chronic kidney disease, stage 5: Secondary | ICD-10-CM

## 2020-01-02 DIAGNOSIS — I4891 Unspecified atrial fibrillation: Secondary | ICD-10-CM

## 2020-01-02 DIAGNOSIS — I5031 Acute diastolic (congestive) heart failure: Secondary | ICD-10-CM

## 2020-01-02 DIAGNOSIS — Z7901 Long term (current) use of anticoagulants: Secondary | ICD-10-CM

## 2020-01-02 LAB — GLUCOSE, CAPILLARY
Glucose-Capillary: 117 mg/dL — ABNORMAL HIGH (ref 70–99)
Glucose-Capillary: 158 mg/dL — ABNORMAL HIGH (ref 70–99)
Glucose-Capillary: 146 mg/dL — ABNORMAL HIGH (ref 70–99)
Glucose-Capillary: 146 mg/dL — ABNORMAL HIGH (ref 70–99)

## 2020-01-02 MED ORDER — CARVEDILOL 12.5 MG PO TABS
12.5000 mg | ORAL_TABLET | Freq: Two times a day (BID) | ORAL | Status: DC
  Administered 2020-01-02 – 2020-01-04 (×2): 12.5 mg via ORAL
  Filled 2020-01-02 (×2): qty 1

## 2020-01-02 NOTE — Progress Notes (Signed)
Patient ID: [name], male   DOB: 07-Jun-1965, 54 y.o.   MRN: [id number] S: Feels "ok".  Tolerated first session of HD without issue late last night. O:BP (!) 153/86 (BP Location: Right Arm)   Pulse 85   Temp 98.2 F (36.8 C)   Resp 18   Ht 5\' 7"  (1.702 m)   Wt 108.9 kg   SpO2 95%   BMI 37.60 kg/m   Intake/Output Summary (Last 24 hours) at 01/02/2020 0946 Last data filed at 01/02/2020 0500 Gross per 24 hour  Intake --  Output 1525 ml  Net -1525 ml   Intake/Output: I/O last 3 completed shifts: In: -  Out: 1525 [Urine:1525]  Intake/Output this shift:  No intake/output data recorded. Weight change: 0.7 kg Gen: NAD CVS: rrr Resp: cta Abd: benign Ext: no edema  Recent Labs  Lab 12/30/19 1023 12/30/19 1731 12/30/19 1923 12/31/19 0500 01/01/20 0120  NA 135 137  --  136 135  K 4.1 3.9  --  3.7 3.5  CL 103  --   --  103 103  CO2 16*  --   --  16* 18*  GLUCOSE 184*  --   --  158* 164*  BUN 82*  --   --  85* 88*  CREATININE 10.43*  --  10.49* 10.50* 10.73*  ALBUMIN  --   --   --  2.6* 2.4*  CALCIUM 9.1  --   --  9.0 8.7*  PHOS  --   --   --  7.0* 7.3*   Liver Function Tests: Recent Labs  Lab 12/31/19 0500 01/01/20 0120  ALBUMIN 2.6* 2.4*   No results for input(s): LIPASE, AMYLASE in the last 168 hours. No results for input(s): AMMONIA in the last 168 hours. CBC: Recent Labs  Lab 12/30/19 1023 12/30/19 1023 12/30/19 1731 12/30/19 1923 12/31/19 0500  WBC 10.7*  --   --  12.5* 10.3  HGB 9.4*   < > 9.9* 9.3* 9.3*  HCT 30.2*   < > 29.0* 29.4* 29.5*  MCV 90.7  --   --  92.5 91.9  PLT 440*  --   --  457* 470*   < > = values in this interval not displayed.   Cardiac Enzymes: No results for input(s): CKTOTAL, CKMB, CKMBINDEX, TROPONINI in the last 168 hours. CBG: Recent Labs  Lab 01/01/20 0735 01/01/20 1226 01/01/20 1538 01/01/20 2148 01/02/20 0732  GLUCAP 137* 134* 133* 253* 117*    Iron Studies:  Recent Labs    12/31/19 2037  IRON 60  TIBC  196*  FERRITIN 175   Studies/Results: IR Fluoro Guide CV Line Right  Result Date: 01/01/2020 INDICATION: End-stage renal disease, no current access EXAM: ULTRASOUND GUIDANCE FOR VASCULAR ACCESS RIGHT INTERNAL JUGULAR PERMANENT HEMODIALYSIS CATHETER Date:  01/01/2020 01/01/2020 12:03 pm Radiologist:  [name]. Daryll Brod, MD Guidance:  Ultrasound and fluoroscopic FLUOROSCOPY TIME:  Fluoroscopy Time: 0 minutes 42 seconds (31 mGy). MEDICATIONS: Ancef 2 g within 1 hour of the procedure ANESTHESIA/SEDATION: Versed 1.0 mg IV; Fentanyl 25 mcg IV; Moderate Sedation Time:  13 minutes The patient was continuously monitored during the procedure by the interventional radiology nurse under my direct supervision. CONTRAST:  None. COMPLICATIONS: None immediate. PROCEDURE: Informed consent was obtained from the patient following explanation of the procedure, risks, benefits and alternatives. The patient understands, agrees and consents for the procedure. All questions were addressed. A time out was performed. Maximal barrier sterile technique utilized including caps, mask, sterile gowns, sterile gloves,  large sterile drape, hand hygiene, and 2% chlorhexidine scrub. Under sterile conditions and local anesthesia, right internal jugular micropuncture venous access was performed with ultrasound. Images were obtained for documentation of the patent right internal jugular vein. A guide wire was inserted followed by a transitional dilator. Next, a 0.035 guidewire was advanced into the IVC with a 5-French catheter. Measurements were obtained from the right venotomy site to the proximal right atrium. In the right infraclavicular chest, a subcutaneous tunnel was created under sterile conditions and local anesthesia. 1% lidocaine with epinephrine was utilized for this. The 23 cm tip to cuff palindrome catheter was tunneled subcutaneously to the venotomy site and inserted into the SVC/RA junction through a valved peel-away sheath. Position was  confirmed with fluoroscopy. Images were obtained for documentation. Blood was aspirated from the catheter followed by saline and heparin flushes. The appropriate volume and strength of heparin was instilled in each lumen. Caps were applied. The catheter was secured at the tunnel site with Gelfoam and a pursestring suture. The venotomy site was closed with subcuticular Vicryl suture. Dermabond was applied to the small right neck incision. A dry sterile dressing was applied. The catheter is ready for use. No immediate complications. IMPRESSION: Ultrasound and fluoroscopically guided right internal jugular tunneled hemodialysis catheter (23 cm tip to cuff palindrome catheter). Electronically Signed   By: [name].  Shick M.D.   On: 01/01/2020 12:13   IR US Guide Vasc Access Right  Result Date: 01/01/2020 INDICATION: End-stage renal disease, no current access EXAM: ULTRASOUND GUIDANCE FOR VASCULAR ACCESS RIGHT INTERNAL JUGULAR PERMANENT HEMODIALYSIS CATHETER Date:  01/01/2020 01/01/2020 12:03 pm Radiologist:  [name]. Daryll Brod, MD Guidance:  Ultrasound and fluoroscopic FLUOROSCOPY TIME:  Fluoroscopy Time: 0 minutes 42 seconds (31 mGy). MEDICATIONS: Ancef 2 g within 1 hour of the procedure ANESTHESIA/SEDATION: Versed 1.0 mg IV; Fentanyl 25 mcg IV; Moderate Sedation Time:  13 minutes The patient was continuously monitored during the procedure by the interventional radiology nurse under my direct supervision. CONTRAST:  None. COMPLICATIONS: None immediate. PROCEDURE: Informed consent was obtained from the patient following explanation of the procedure, risks, benefits and alternatives. The patient understands, agrees and consents for the procedure. All questions were addressed. A time out was performed. Maximal barrier sterile technique utilized including caps, mask, sterile gowns, sterile gloves, large sterile drape, hand hygiene, and 2% chlorhexidine scrub. Under sterile conditions and local anesthesia, right internal jugular  micropuncture venous access was performed with ultrasound. Images were obtained for documentation of the patent right internal jugular vein. A guide wire was inserted followed by a transitional dilator. Next, a 0.035 guidewire was advanced into the IVC with a 5-French catheter. Measurements were obtained from the right venotomy site to the proximal right atrium. In the right infraclavicular chest, a subcutaneous tunnel was created under sterile conditions and local anesthesia. 1% lidocaine with epinephrine was utilized for this. The 23 cm tip to cuff palindrome catheter was tunneled subcutaneously to the venotomy site and inserted into the SVC/RA junction through a valved peel-away sheath. Position was confirmed with fluoroscopy. Images were obtained for documentation. Blood was aspirated from the catheter followed by saline and heparin flushes. The appropriate volume and strength of heparin was instilled in each lumen. Caps were applied. The catheter was secured at the tunnel site with Gelfoam and a pursestring suture. The venotomy site was closed with subcuticular Vicryl suture. Dermabond was applied to the small right neck incision. A dry sterile dressing was applied. The catheter is ready for use. No  immediate complications. IMPRESSION: Ultrasound and fluoroscopically guided right internal jugular tunneled hemodialysis catheter (23 cm tip to cuff palindrome catheter). Electronically Signed   By: [name].  Shick M.D.   On: 01/01/2020 12:13   . amLODipine  10 mg Oral Daily  . calcitRIOL  0.25 mcg Oral Q M,W,F  . Chlorhexidine Gluconate Cloth  6 each Topical Q0600  . [START ON 01/07/2020] darbepoetin (ARANESP) injection - DIALYSIS  40 mcg Intravenous Q Thu-HD  . gabapentin  300 mg Oral Daily  . heparin  5,000 Units Subcutaneous Q8H  . insulin aspart  0-5 Units Subcutaneous QHS  . insulin aspart  0-6 Units Subcutaneous TID WC  . labetalol  200 mg Oral BID  . sevelamer carbonate  800 mg Oral TID WC    BMET     Component Value Date/Time   NA 135 01/01/2020 0120   K 3.5 01/01/2020 0120   CL 103 01/01/2020 0120   CO2 18 (L) 01/01/2020 0120   GLUCOSE 164 (H) 01/01/2020 0120   BUN 88 (H) 01/01/2020 0120   CREATININE 10.73 (H) 01/01/2020 0120   CALCIUM 8.7 (L) 01/01/2020 0120   GFRNONAA 5 (L) 01/01/2020 0120   GFRAA 6 (L) 01/01/2020 0120   CBC    Component Value Date/Time   WBC 10.3 12/31/2019 0500   RBC 3.21 (L) 12/31/2019 0500   HGB 9.3 (L) 12/31/2019 0500   HCT 29.5 (L) 12/31/2019 0500   PLT 470 (H) 12/31/2019 0500   MCV 91.9 12/31/2019 0500   MCH 29.0 12/31/2019 0500   MCHC 31.5 12/31/2019 0500   RDW 14.5 12/31/2019 0500   LYMPHSABS 2.2 07/27/2017 1721   MONOABS 0.2 07/27/2017 1721   EOSABS 0.0 07/27/2017 1721   BASOSABS 0.0 07/27/2017 1721    Assessment/Plan:  1. 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   1. S/p RIJ TDC placement on 01/01/20 and first session of HD following. 2. Plan for second treatment either today or tomorrow depending upon HD inpatient census. 3. Will need [facility] eval for AVF/AVG and CLIP for outpatient HD arrangements. 2. HTN- follow after UF with HD. 3. Chronic diastolic CHF- euvolemic on exam, cont beta-blocker 4. Metabolic acidosis due to #1 follow after HD 5. Anemia of CKD stage V- started on Aranesp 40 mcg IV with HD 01/01/20. follow iron stores. 6. Vascular access- was set up for AVF/AVG at end of October but will need one next week according to OR schedule with [facility]VS. 7. Secondary HPTH- on renvela and calcitriol 8. Chest pain- per primary 9. PAfib 10. OSA on CPAP Donetta Potts, MD [employer] [phone]

## 2020-01-02 NOTE — Progress Notes (Signed)
HD treatment order for 01/03/2020 per Dr Marval Regal, HD orders modified. Primary RN Nadene notified at [phone]

## 2020-01-02 NOTE — Consult Note (Signed)
REASON FOR CONSULT:    For long-term hemodialysis access.  The consult is requested by Dr. Marval Regal.  ASSESSMENT & PLAN:   STAGE V CHRONIC KIDNEY DISEASE:: We have been consulted for hemodialysis access.  He has a functioning right IJ tunneled dialysis catheter.  I have ordered bilateral upper extremity vein mapping.  I will not move his IV from the left arm until the results of the vein map.  Pending the results of his vein map we will try to schedule him for access early next week.  Of note he is on Eliquis for paroxysmal atrial fibrillation and this will have to be stopped 48 hours prior to the procedure.  I have explained the indications for placement of an AV fistula or AV graft. I've explained that if at all possible we will place an AV fistula.  I have reviewed the risks of placement of an AV fistula including but not limited to: failure of the fistula to mature, need for subsequent interventions, and thrombosis. In addition I have reviewed the potential complications of placement of an AV graft. These risks include, but are not limited to, graft thrombosis, graft infection, wound healing problems, bleeding, arm swelling, and steal syndrome. All the patient's questions were answered and they are agreeable to proceed with surgery.   [name], MD Office: [phone]   HPI:    [name] is a pleasant 54 y.o. male, who was admitted on 12/30/2019 with chest pain and shortness of breath.   He denies any recent uremic symptoms.  Specifically, he denies nausea, vomiting, fatigue, anorexia, or palpitations.  He had a right IJ tunneled dialysis catheter placed by interventional radiology on 01/01/2020.  He had hemodialysis yesterday (Friday), and he is scheduled for dialysis again tomorrow (Sunday).  He does not have a pacemaker.  He is on Eliquis for atrial fibrillation.  His risk factors for peripheral vascular disease include diabetes and hypertension.  He denies any history  of hypercholesterolemia, family history of premature cardiovascular disease, or tobacco use.  Past Medical History:  Diagnosis Date  . Acute kidney injury ([place]) 12/28/2014  . Adjustment disorder with mixed anxiety and depressed mood 12/28/2014  . CPAP (continuous positive airway pressure) dependence   . Diabetes mellitus without complication ([place])   . DM (diabetes mellitus) type II controlled with renal manifestation ([place]) 12/28/2014  . DM (diabetes mellitus) type II controlled, neurological manifestation ([place]) 12/28/2014  . Encephalopathy, hypertensive 12/28/2014  . Hypertension   . Hypertensive emergency without congestive heart failure 12/27/2014  . Possible Panic disorder 12/28/2014  . Renal disorder   . 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 Sleep apnea     Family History  Problem Relation Age of Onset  . Hypertension Mother     SOCIAL HISTORY: Social History   Socioeconomic History  . Marital status: Married    Spouse name: Not on file  . Number of children: Not on file  . Years of education: Not on file  . Highest education level: Not on file  Occupational History  . Not on file  Tobacco Use  . Smoking status: Never Smoker  . Smokeless tobacco: Never Used  Vaping Use  . Vaping Use: Never used  Substance and Sexual Activity  . Alcohol use: No  . Drug use: No  . Sexual activity: Not on file  Other Topics Concern  . Not on file  Social History Narrative  . Not on file   Social Determinants of Health   Financial Resource  Strain:   . Difficulty of Paying Living Expenses: Not on file  Food Insecurity:   . Worried About Charity fundraiser in the Last Year: Not on file  . Ran Out of Food in the Last Year: Not on file  Transportation Needs:   . Lack of Transportation (Medical): Not on file  . Lack of Transportation (Non-Medical): Not on file  Physical Activity:   . Days of Exercise per Week: Not on file  . Minutes of Exercise per Session: Not on file  Stress:   .  Feeling of Stress : Not on file  Social Connections:   . Frequency of Communication with Friends and Family: Not on file  . Frequency of Social Gatherings with Friends and Family: Not on file  . Attends Religious Services: Not on file  . Active Member of Clubs or Organizations: Not on file  . Attends Archivist Meetings: Not on file  . Marital Status: Not on file  Intimate Partner Violence:   . Fear of Current or Ex-Partner: Not on file  . Emotionally Abused: Not on file  . Physically Abused: Not on file  . Sexually Abused: Not on file    No Known Allergies  Current Facility-Administered Medications  Medication Dose Route Frequency Provider Last Rate Last Admin  . 0.9 %  sodium chloride infusion  100 mL Intravenous PRN Harrie Jeans C, MD      . 0.9 %  sodium chloride infusion  100 mL Intravenous PRN Claudia Desanctis, MD      . acetaminophen (TYLENOL) tablet 650 mg  650 mg Oral Q6H PRN Elwyn Reach, MD   650 mg at 01/02/20 1208   Or  . acetaminophen (TYLENOL) suppository 650 mg  650 mg Rectal Q6H PRN Gala Romney L, MD      . alteplase (CATHFLO ACTIVASE) injection 2 mg  2 mg Intracatheter Once PRN Claudia Desanctis, MD      . amLODipine (NORVASC) tablet 10 mg  10 mg Oral Daily Little Ishikawa, MD   10 mg at 01/02/20 1057  . calcitRIOL (ROCALTROL) capsule 0.25 mcg  0.25 mcg Oral Q M,W,F Claudia Desanctis, MD   0.25 mcg at 01/01/20 1230  . calcium carbonate (dosed in mg elemental calcium) suspension 500 mg of elemental calcium  500 mg of elemental calcium Oral Q6H PRN Elwyn Reach, MD      . camphor-menthol (SARNA) lotion 1 application  1 application Topical Q3R PRN Elwyn Reach, MD       And  . hydrOXYzine (ATARAX/VISTARIL) tablet 25 mg  25 mg Oral Q8H PRN Gala Romney L, MD      . carvedilol (COREG) tablet 12.5 mg  12.5 mg Oral BID WC Little Ishikawa, MD      . Chlorhexidine Gluconate Cloth 2 % PADS 6 each  6 each Topical Q0600 Claudia Desanctis, MD   6  each at 01/02/20 [phone]  . [START ON 01/07/2020] Darbepoetin Alfa (ARANESP) injection 40 mcg  40 mcg Intravenous Q Thu-HD Coladonato, Broadus John, MD      . docusate sodium (ENEMEEZ) enema 283 mg  1 enema Rectal PRN Elwyn Reach, MD      . feeding supplement (NEPRO CARB STEADY) liquid 237 mL  237 mL Oral TID PRN Elwyn Reach, MD      . gabapentin (NEURONTIN) capsule 300 mg  300 mg Oral Daily Little Ishikawa, MD   300 mg at 01/02/20 1057  .  heparin injection 1,000 Units  1,000 Units Dialysis PRN Claudia Desanctis, MD   1,000 Units at 01/01/20 1859  . heparin injection 5,000 Units  5,000 Units Subcutaneous Q8H Elwyn Reach, MD   5,000 Units at 01/02/20 0545  . insulin aspart (novoLOG) injection 0-5 Units  0-5 Units Subcutaneous QHS Elwyn Reach, MD   3 Units at 01/01/20 2227  . insulin aspart (novoLOG) injection 0-6 Units  0-6 Units Subcutaneous TID WC Elwyn Reach, MD   1 Units at 12/31/19 1227  . lidocaine (PF) (XYLOCAINE) 1 % injection 5 mL  5 mL Intradermal PRN Claudia Desanctis, MD      . lidocaine-prilocaine (EMLA) cream 1 application  1 application Topical PRN Claudia Desanctis, MD      . ondansetron (ZOFRAN) tablet 4 mg  4 mg Oral Q6H PRN Elwyn Reach, MD       Or  . ondansetron (ZOFRAN) injection 4 mg  4 mg Intravenous Q6H PRN Elwyn Reach, MD      . pentafluoroprop-tetrafluoroeth (GEBAUERS) aerosol 1 application  1 application Topical PRN Claudia Desanctis, MD      . sevelamer carbonate (RENVELA) tablet 800 mg  800 mg Oral TID WC Claudia Desanctis, MD   800 mg at 01/02/20 1057  . sorbitol 70 % solution 30 mL  30 mL Oral PRN Elwyn Reach, MD      . zolpidem (AMBIEN) tablet 5 mg  5 mg Oral QHS PRN Elwyn Reach, MD        REVIEW OF SYSTEMS:  [X]  denotes positive finding, [ ]  denotes negative finding Cardiac  Comments:  Chest pain or chest pressure:    Shortness of breath upon exertion: x   Short of breath when lying flat:    Irregular heart rhythm:          Vascular    Pain in calf, thigh, or hip brought on by ambulation:    Pain in feet at night that wakes you up from your sleep:     Blood clot in your veins:    Leg swelling:         Pulmonary    Oxygen at home:    Productive cough:     Wheezing:         Neurologic    Sudden weakness in arms or legs:     Sudden numbness in arms or legs:     Sudden onset of difficulty speaking or slurred speech:    Temporary loss of vision in one eye:     Problems with dizziness:         Gastrointestinal    Blood in stool:     Vomited blood:         Genitourinary    Burning when urinating:     Blood in urine:        Psychiatric    Major depression:         Hematologic    Bleeding problems:    Problems with blood clotting too easily:        Skin    Rashes or ulcers:        Constitutional    Fever or chills:     PHYSICAL EXAM:   Vitals:   01/01/20 1830 01/01/20 1845 01/01/20 2158 01/02/20 0732  BP: (!) 166/98 (!) 170/90 (!) 149/82 (!) 153/86  Pulse: 82 83 91 85  Resp: 16 16 17 18   Temp:  53 F (36.7 C) 98.2 F (36.8 C)  TempSrc:   Oral   SpO2:   97% 95%  Weight:  108.9 kg    Height:        GENERAL: The patient is a well-nourished male, in no acute distress. The vital signs are documented above. CARDIAC: There is a regular rate and rhythm.  VASCULAR: I do not detect carotid bruits. He has palpable radial pulses bilaterally. He has palpable dorsalis pedis pulses bilaterally. He has an IV in his left antecubital vein. PULMONARY: There is good air exchange bilaterally without wheezing or rales. ABDOMEN: Soft and non-tender with normal pitched bowel sounds.  MUSCULOSKELETAL: There are no major deformities or cyanosis. NEUROLOGIC: No focal weakness or paresthesias are detected. SKIN: There are no ulcers or rashes noted. PSYCHIATRIC: The patient has a normal affect.  DATA:    VEIN MAP: His vein map is pending.  RENAL DUPLEX: I have reviewed his renal duplex scan that was  done on 10/27/2019.  There is no evidence of renal artery stenosis on either size.  Both kidneys are normal in size.  LABS:   On 12/31/2019, his hemoglobin was 9.3, hematocrit 29.5, platelets 170,000, white blood cell count 10.3.  On 12/30/2019 his Covid test was negative.  On 01/01/2020, his GFR was 6.  Creatinine was 10.7.

## 2020-01-02 NOTE — Progress Notes (Signed)
PROGRESS NOTE    [name]  EHU:[id number] DOB: October 22, 1965 DOA: 12/30/2019 PCP: Bartholome Bill, MD   Brief Narrative:  [name] is a 54 y.o. male with medical history significant of chronic kidney disease stage IV, diabetes, hypertension, right leg wound, obstructive sleep apnea on CPAP, hypertensive encephalopathy and adjustment disorder who is being followed by nephrology for advanced renal disease not yet requiring hemodialysis.  Patient was sent from wound care center where he went for his routine appointment today. While there he was noted to have worsening renal function. Patient arrived the ER where his BUN/creatinine were noted to be higher than his baseline.  His renal function showed BUN of 82 and creatinine 10.43.  Previous creatinine was 8.23.  Potassium however is normal at 4.1.  Admitted to the hospital for evaluation by nephrology.  Assessment & Plan:   Principal Problem:   Acute renal failure superimposed on stage 5 chronic kidney disease, not on chronic dialysis (HCC) Active Problems:   CKD (chronic kidney disease), stage V (HCC)   Essential hypertension, malignant   Diabetes mellitus type 2 in nonobese (HCC)   Acute diastolic congestive heart failure (HCC)   Chronic ulcer of right leg (HCC)   AF (paroxysmal atrial fibrillation) (HCC)   Acute on chronic kidney disease stage V - progressing now ESRD, POA:  - Nephrology following, patient now progressing to ESRD dialysis per their schedule - Tolerated first dialysis on 01/01/2020 without any complication or issue - Status post RIJ dialysis catheter - subsequent dialysis per nephrology schedule. - AV fistula/graft planned end of October, following with vascular for evaluation as early as next week.  Paroxysmal A. fib:  - Currently normal sinus rhythm; no indication for anticoagulation  Insulin-dependent diabetes type 2, uncontrolled, hyperglycemia, POA  - Continue sliding scale insulin, hypoglycemic  protocol - Home insulin appears to be 70/30 25 units twice daily Lab Results  Component Value Date   HGBA1C 7.5 (H) 12/30/2019   Malignant hypertension, improving:  - Patient reports outpatient hypotension previously holding or skipping home medications which likely caused rebound malignant hypertension as above exacerbating kidney disease - Home regimen includes clonidine patch, Lasix 40 daily, hydralazine 100 every 6 hours - We have restarted patient's home amlodipine and labetalol - we will transition from labetalol to carvedilol for more blood pressure control - Avoid clonidine given concern for rebound hypertension as well as avoid any nephrotoxic's given above  Chronic right lower extremity ulcer:  - Appears well-healing, continue wound care.  - Patient complaining of neuropathic pain in this area, will increase gabapentin to 3 times daily - Wound does not appear to be acute or inflamed, would not administer IV narcotics despite patient's specific request  Obstructive sleep apnea:  - Patient reportedly has been on CPAP - offered to use in hospital if patient agreeable  DVT prophylaxis: Heparin Code Status: Full code Family Communication: None present  Status is: Inpatient  Dispo: The patient is from: Home              Anticipated d/c is to: To be determined              Anticipated d/c date is: Likely 4 to 5 days pending clinical course              Patient currently not medically stable for discharge given worsening kidney function, uncontrolled blood pressure need for close monitoring and ongoing dialysis  Consultants:   Neprhology: Dr. Daylene Katayama  Procedures:   None  Antimicrobials:  None indicated  Subjective: No acute issues or events overnight, patient continues to complain of generalized right-sided neuropathic pain of his right chronic lower extremity wound as well as some right shoulder and chest wall discomfort after IJ placement.  He otherwise denies  chest pain, shortness of breath, nausea, vomiting, diarrhea, constipation, headache, fevers, chills.  Objective: Vitals:   01/01/20 1830 01/01/20 1845 01/01/20 2158 01/02/20 0732  BP: (!) 166/98 (!) 170/90 (!) 149/82 (!) 153/86  Pulse: 82 83 91 85  Resp: 16 16 17 18   Temp:   98 F (36.7 C) 98.2 F (36.8 C)  TempSrc:   Oral   SpO2:   97% 95%  Weight:  108.9 kg    Height:        Intake/Output Summary (Last 24 hours) at 01/02/2020 0742 Last data filed at 01/02/2020 0500 Gross per 24 hour  Intake --  Output 1525 ml  Net -1525 ml   Filed Weights   01/01/20 0500 01/01/20 1635 01/01/20 1845  Weight: 108.2 kg 108.9 kg 108.9 kg    Examination:  General exam: Appears calm and comfortable  Respiratory system: Clear to auscultation. Respiratory effort normal. Cardiovascular system: S1 & S2 heard, RRR. No JVD, murmurs, rubs, gallops or clicks. No pedal edema. Gastrointestinal system: Abdomen is nondistended, soft and nontender. No organomegaly or masses felt. Normal bowel sounds heard. Central nervous system: Alert and oriented. No focal neurological deficits. Extremities: Symmetric 5 x 5 power. Skin: Right anterior 6 cm x 3 cm chronic wound with granulation tissue, appears well-healed without purulence erythema or discharge Psychiatry: Judgement and insight appear normal. Mood & affect appropriate.   Data Reviewed: I have personally reviewed following labs and imaging studies  CBC: Recent Labs  Lab 12/30/19 1023 12/30/19 1731 12/30/19 1923 12/31/19 0500  WBC 10.7*  --  12.5* 10.3  HGB 9.4* 9.9* 9.3* 9.3*  HCT 30.2* 29.0* 29.4* 29.5*  MCV 90.7  --  92.5 91.9  PLT 440*  --  457* 403*   Basic Metabolic Panel: Recent Labs  Lab 12/30/19 1023 12/30/19 1731 12/30/19 1923 12/31/19 0500 01/01/20 0120  NA 135 137  --  136 135  K 4.1 3.9  --  3.7 3.5  CL 103  --   --  103 103  CO2 16*  --   --  16* 18*  GLUCOSE 184*  --   --  158* 164*  BUN 82*  --   --  85* 88*   CREATININE 10.43*  --  10.49* 10.50* 10.73*  CALCIUM 9.1  --   --  9.0 8.7*  PHOS  --   --   --  7.0* 7.3*   GFR: Estimated Creatinine Clearance: 9.4 mL/min (A) (by C-G formula based on SCr of 10.73 mg/dL (H)). Liver Function Tests: Recent Labs  Lab 12/31/19 0500 01/01/20 0120  ALBUMIN 2.6* 2.4*   No results for input(s): LIPASE, AMYLASE in the last 168 hours. No results for input(s): AMMONIA in the last 168 hours. Coagulation Profile: No results for input(s): INR, PROTIME in the last 168 hours. Cardiac Enzymes: No results for input(s): CKTOTAL, CKMB, CKMBINDEX, TROPONINI in the last 168 hours. BNP (last 3 results) No results for input(s): PROBNP in the last 8760 hours. HbA1C: Recent Labs    12/30/19 1923  HGBA1C 7.5*   CBG: Recent Labs  Lab 01/01/20 0735 01/01/20 1226 01/01/20 1538 01/01/20 2148 01/02/20 0732  GLUCAP 137* 134* 133* 253* 117*   Lipid Profile: No results  for input(s): CHOL, HDL, LDLCALC, TRIG, CHOLHDL, LDLDIRECT in the last 72 hours. Thyroid Function Tests: No results for input(s): TSH, T4TOTAL, FREET4, T3FREE, THYROIDAB in the last 72 hours. Anemia Panel: Recent Labs    12/31/19 2037  FERRITIN 175  TIBC 196*  IRON 60   Sepsis Labs: Recent Labs  Lab 12/30/19 1721  LATICACIDVEN 0.7    Recent Results (from the past 240 hour(s))  SARS Coronavirus 2 by RT PCR (hospital order, performed in [facility] hospital lab) Nasopharyngeal Nasopharyngeal Swab     Status: None   Collection Time: 12/30/19  6:37 PM   Specimen: Nasopharyngeal Swab  Result Value Ref Range Status   SARS Coronavirus 2 NEGATIVE NEGATIVE Final    Comment: (NOTE) SARS-CoV-2 target nucleic acids are NOT DETECTED.  The SARS-CoV-2 RNA is generally detectable in upper and lower respiratory specimens during the acute phase of infection. The lowest concentration of SARS-CoV-2 viral copies this assay can detect is 250 copies / mL. A negative result does not preclude SARS-CoV-2  infection and should not be used as the sole basis for treatment or other patient management decisions.  A negative result may occur with improper specimen collection / handling, submission of specimen other than nasopharyngeal swab, presence of viral mutation(s) within the areas targeted by this assay, and inadequate number of viral copies (<250 copies / mL). A negative result must be combined with clinical observations, patient history, and epidemiological information.  Fact Sheet for Patients:   StrictlyIdeas.no  Fact Sheet for Healthcare Providers: BankingDealers.co.za  This test is not yet approved or  cleared by the [place] FDA and has been authorized for detection and/or diagnosis of SARS-CoV-2 by FDA under an Emergency Use Authorization (EUA).  This EUA will remain in effect (meaning this test can be used) for the duration of the COVID-19 declaration under Section 564(b)(1) of the Act, 21 U.S.C. section 360bbb-3(b)(1), unless the authorization is terminated or revoked sooner.  Performed at [facility], [place] [address], [place], [place] [address]          Radiology Studies: IR Fluoro Guide CV Line Right  Result Date: 01/01/2020 INDICATION: End-stage renal disease, no current access EXAM: ULTRASOUND GUIDANCE FOR VASCULAR ACCESS RIGHT INTERNAL JUGULAR PERMANENT HEMODIALYSIS CATHETER Date:  01/01/2020 01/01/2020 12:03 pm Radiologist:  [name]. Daryll Brod, MD Guidance:  Ultrasound and fluoroscopic FLUOROSCOPY TIME:  Fluoroscopy Time: 0 minutes 42 seconds (31 mGy). MEDICATIONS: Ancef 2 g within 1 hour of the procedure ANESTHESIA/SEDATION: Versed 1.0 mg IV; Fentanyl 25 mcg IV; Moderate Sedation Time:  13 minutes The patient was continuously monitored during the procedure by the interventional radiology nurse under my direct supervision. CONTRAST:  None. COMPLICATIONS: None immediate. PROCEDURE: Informed consent was obtained from  the patient following explanation of the procedure, risks, benefits and alternatives. The patient understands, agrees and consents for the procedure. All questions were addressed. A time out was performed. Maximal barrier sterile technique utilized including caps, mask, sterile gowns, sterile gloves, large sterile drape, hand hygiene, and 2% chlorhexidine scrub. Under sterile conditions and local anesthesia, right internal jugular micropuncture venous access was performed with ultrasound. Images were obtained for documentation of the patent right internal jugular vein. A guide wire was inserted followed by a transitional dilator. Next, a 0.035 guidewire was advanced into the IVC with a 5-French catheter. Measurements were obtained from the right venotomy site to the proximal right atrium. In the right infraclavicular chest, a subcutaneous tunnel was created under sterile conditions and local  anesthesia. 1% lidocaine with epinephrine was utilized for this. The 23 cm tip to cuff palindrome catheter was tunneled subcutaneously to the venotomy site and inserted into the SVC/RA junction through a valved peel-away sheath. Position was confirmed with fluoroscopy. Images were obtained for documentation. Blood was aspirated from the catheter followed by saline and heparin flushes. The appropriate volume and strength of heparin was instilled in each lumen. Caps were applied. The catheter was secured at the tunnel site with Gelfoam and a pursestring suture. The venotomy site was closed with subcuticular Vicryl suture. Dermabond was applied to the small right neck incision. A dry sterile dressing was applied. The catheter is ready for use. No immediate complications. IMPRESSION: Ultrasound and fluoroscopically guided right internal jugular tunneled hemodialysis catheter (23 cm tip to cuff palindrome catheter). Electronically Signed   By: [name].  Shick M.D.   On: 01/01/2020 12:13   IR US Guide Vasc Access Right  Result Date:  01/01/2020 INDICATION: End-stage renal disease, no current access EXAM: ULTRASOUND GUIDANCE FOR VASCULAR ACCESS RIGHT INTERNAL JUGULAR PERMANENT HEMODIALYSIS CATHETER Date:  01/01/2020 01/01/2020 12:03 pm Radiologist:  [name]. Daryll Brod, MD Guidance:  Ultrasound and fluoroscopic FLUOROSCOPY TIME:  Fluoroscopy Time: 0 minutes 42 seconds (31 mGy). MEDICATIONS: Ancef 2 g within 1 hour of the procedure ANESTHESIA/SEDATION: Versed 1.0 mg IV; Fentanyl 25 mcg IV; Moderate Sedation Time:  13 minutes The patient was continuously monitored during the procedure by the interventional radiology nurse under my direct supervision. CONTRAST:  None. COMPLICATIONS: None immediate. PROCEDURE: Informed consent was obtained from the patient following explanation of the procedure, risks, benefits and alternatives. The patient understands, agrees and consents for the procedure. All questions were addressed. A time out was performed. Maximal barrier sterile technique utilized including caps, mask, sterile gowns, sterile gloves, large sterile drape, hand hygiene, and 2% chlorhexidine scrub. Under sterile conditions and local anesthesia, right internal jugular micropuncture venous access was performed with ultrasound. Images were obtained for documentation of the patent right internal jugular vein. A guide wire was inserted followed by a transitional dilator. Next, a 0.035 guidewire was advanced into the IVC with a 5-French catheter. Measurements were obtained from the right venotomy site to the proximal right atrium. In the right infraclavicular chest, a subcutaneous tunnel was created under sterile conditions and local anesthesia. 1% lidocaine with epinephrine was utilized for this. The 23 cm tip to cuff palindrome catheter was tunneled subcutaneously to the venotomy site and inserted into the SVC/RA junction through a valved peel-away sheath. Position was confirmed with fluoroscopy. Images were obtained for documentation. Blood was aspirated  from the catheter followed by saline and heparin flushes. The appropriate volume and strength of heparin was instilled in each lumen. Caps were applied. The catheter was secured at the tunnel site with Gelfoam and a pursestring suture. The venotomy site was closed with subcuticular Vicryl suture. Dermabond was applied to the small right neck incision. A dry sterile dressing was applied. The catheter is ready for use. No immediate complications. IMPRESSION: Ultrasound and fluoroscopically guided right internal jugular tunneled hemodialysis catheter (23 cm tip to cuff palindrome catheter). Electronically Signed   By: [name].  Shick M.D.   On: 01/01/2020 12:13    Scheduled Meds:  amLODipine  10 mg Oral Daily   calcitRIOL  0.25 mcg Oral Q M,W,F   Chlorhexidine Gluconate Cloth  6 each Topical Q0600   [START ON 01/07/2020] darbepoetin (ARANESP) injection - DIALYSIS  40 mcg Intravenous Q Thu-HD   gabapentin  300 mg Oral  Daily   heparin  5,000 Units Subcutaneous Q8H   insulin aspart  0-5 Units Subcutaneous QHS   insulin aspart  0-6 Units Subcutaneous TID WC   labetalol  200 mg Oral BID   sevelamer carbonate  800 mg Oral TID WC    LOS: 3 days   Time spent: 84min  Takeyah Wieman C Loveta Dellis, DO [facility]  If 7PM-7AM, please contact night-coverage [email or web address]  01/02/2020, 7:42 AM

## 2020-01-03 ENCOUNTER — Inpatient Hospital Stay (HOSPITAL_COMMUNITY): Payer: Medicare Other

## 2020-01-03 DIAGNOSIS — N186 End stage renal disease: Secondary | ICD-10-CM

## 2020-01-03 LAB — BASIC METABOLIC PANEL
Anion gap: 14 (ref 5–15)
BUN: 69 mg/dL — ABNORMAL HIGH (ref 6–20)
CO2: 20 mmol/L — ABNORMAL LOW (ref 22–32)
Calcium: 9 mg/dL (ref 8.9–10.3)
Chloride: 102 mmol/L (ref 98–111)
Creatinine, Ser: 9.49 mg/dL — ABNORMAL HIGH (ref 0.61–1.24)
GFR calc Af Amer: 7 mL/min — ABNORMAL LOW (ref >60)
GFR calc non Af Amer: 6 mL/min — ABNORMAL LOW (ref >60)
Glucose, Bld: 186 mg/dL — ABNORMAL HIGH (ref 70–99)
Potassium: 3.6 mmol/L (ref 3.5–5.1)
Sodium: 136 mmol/L (ref 135–145)

## 2020-01-03 LAB — CBC
HCT: 26.9 % — ABNORMAL LOW (ref 39.0–52.0)
Hemoglobin: 8.5 g/dL — ABNORMAL LOW (ref 13.0–17.0)
MCH: 28.6 pg (ref 26.0–34.0)
MCHC: 31.6 g/dL (ref 30.0–36.0)
MCV: 90.6 fL (ref 80.0–100.0)
Platelets: 419 10*3/uL — ABNORMAL HIGH (ref 150–400)
RBC: 2.97 MIL/uL — ABNORMAL LOW (ref 4.22–5.81)
RDW: 14.1 % (ref 11.5–15.5)
WBC: 9.9 10*3/uL (ref 4.0–10.5)
nRBC: 0 % (ref 0.0–0.2)

## 2020-01-03 LAB — GLUCOSE, CAPILLARY
Glucose-Capillary: 116 mg/dL — ABNORMAL HIGH (ref 70–99)
Glucose-Capillary: 149 mg/dL — ABNORMAL HIGH (ref 70–99)
Glucose-Capillary: 136 mg/dL — ABNORMAL HIGH (ref 70–99)
Glucose-Capillary: 138 mg/dL — ABNORMAL HIGH (ref 70–99)

## 2020-01-03 MED ORDER — HYDRALAZINE HCL 25 MG PO TABS
25.0000 mg | ORAL_TABLET | Freq: Three times a day (TID) | ORAL | Status: DC
  Administered 2020-01-04 – 2020-01-05 (×4): 25 mg via ORAL
  Filled 2020-01-03 (×5): qty 1

## 2020-01-03 MED ORDER — HEPARIN SODIUM (PORCINE) 1000 UNIT/ML IJ SOLN
INTRAMUSCULAR | Status: AC
  Filled 2020-01-03: qty 5

## 2020-01-03 NOTE — Progress Notes (Signed)
Patient ID: [name], male   DOB: Aug 08, 1965, 54 y.o.   MRN: [id number] S: No complaints.  Did not have his second HD session yesterday due to high census.  For tx today O:BP 140/75 (BP Location: Right Arm)   Pulse 85   Temp 98.5 F (36.9 C) (Oral)   Resp 17   Ht 5\' 7"  (1.702 m)   Wt 108.9 kg   SpO2 96%   BMI 37.60 kg/m   Intake/Output Summary (Last 24 hours) at 01/03/2020 1047 Last data filed at 01/03/2020 0834 Gross per 24 hour  Intake --  Output 300 ml  Net -300 ml   Intake/Output: I/O last 3 completed shifts: In: -  Out: 525 [Urine:525]  Intake/Output this shift:  Total I/O In: -  Out: 300 [Urine:300] Weight change:  Gen: NAD CVS: no rub Resp: cta Abd: +BS, soft,nt/nd Ext: no edema  Recent Labs  Lab 12/30/19 1023 12/30/19 1731 12/30/19 1923 12/31/19 0500 01/01/20 0120 01/03/20 0212  NA 135 137  --  136 135 136  K 4.1 3.9  --  3.7 3.5 3.6  CL 103  --   --  103 103 102  CO2 16*  --   --  16* 18* 20*  GLUCOSE 184*  --   --  158* 164* 186*  BUN 82*  --   --  85* 88* 69*  CREATININE 10.43*  --  10.49* 10.50* 10.73* 9.49*  ALBUMIN  --   --   --  2.6* 2.4*  --   CALCIUM 9.1  --   --  9.0 8.7* 9.0  PHOS  --   --   --  7.0* 7.3*  --    Liver Function Tests: Recent Labs  Lab 12/31/19 0500 01/01/20 0120  ALBUMIN 2.6* 2.4*   No results for input(s): LIPASE, AMYLASE in the last 168 hours. No results for input(s): AMMONIA in the last 168 hours. CBC: Recent Labs  Lab 12/30/19 1023 12/30/19 1731 12/30/19 1923 12/31/19 0500 01/03/20 0212  WBC 10.7*  --  12.5* 10.3 9.9  HGB 9.4*   < > 9.3* 9.3* 8.5*  HCT 30.2*   < > 29.4* 29.5* 26.9*  MCV 90.7  --  92.5 91.9 90.6  PLT 440*  --  457* 470* 419*   < > = values in this interval not displayed.   Cardiac Enzymes: No results for input(s): CKTOTAL, CKMB, CKMBINDEX, TROPONINI in the last 168 hours. CBG: Recent Labs  Lab 01/02/20 0732 01/02/20 1213 01/02/20 1631 01/02/20 2130 01/03/20 0759  GLUCAP 117*  158* 146* 146* 116*    Iron Studies:  Recent Labs    12/31/19 2037  IRON 60  TIBC 196*  FERRITIN 175   Studies/Results: IR Fluoro Guide CV Line Right  Result Date: 01/01/2020 INDICATION: End-stage renal disease, no current access EXAM: ULTRASOUND GUIDANCE FOR VASCULAR ACCESS RIGHT INTERNAL JUGULAR PERMANENT HEMODIALYSIS CATHETER Date:  01/01/2020 01/01/2020 12:03 pm Radiologist:  [name]. Daryll Brod, MD Guidance:  Ultrasound and fluoroscopic FLUOROSCOPY TIME:  Fluoroscopy Time: 0 minutes 42 seconds (31 mGy). MEDICATIONS: Ancef 2 g within 1 hour of the procedure ANESTHESIA/SEDATION: Versed 1.0 mg IV; Fentanyl 25 mcg IV; Moderate Sedation Time:  13 minutes The patient was continuously monitored during the procedure by the interventional radiology nurse under my direct supervision. CONTRAST:  None. COMPLICATIONS: None immediate. PROCEDURE: Informed consent was obtained from the patient following explanation of the procedure, risks, benefits and alternatives. The patient understands, agrees and consents for the procedure.  All questions were addressed. A time out was performed. Maximal barrier sterile technique utilized including caps, mask, sterile gowns, sterile gloves, large sterile drape, hand hygiene, and 2% chlorhexidine scrub. Under sterile conditions and local anesthesia, right internal jugular micropuncture venous access was performed with ultrasound. Images were obtained for documentation of the patent right internal jugular vein. A guide wire was inserted followed by a transitional dilator. Next, a 0.035 guidewire was advanced into the IVC with a 5-French catheter. Measurements were obtained from the right venotomy site to the proximal right atrium. In the right infraclavicular chest, a subcutaneous tunnel was created under sterile conditions and local anesthesia. 1% lidocaine with epinephrine was utilized for this. The 23 cm tip to cuff palindrome catheter was tunneled subcutaneously to the venotomy  site and inserted into the SVC/RA junction through a valved peel-away sheath. Position was confirmed with fluoroscopy. Images were obtained for documentation. Blood was aspirated from the catheter followed by saline and heparin flushes. The appropriate volume and strength of heparin was instilled in each lumen. Caps were applied. The catheter was secured at the tunnel site with Gelfoam and a pursestring suture. The venotomy site was closed with subcuticular Vicryl suture. Dermabond was applied to the small right neck incision. A dry sterile dressing was applied. The catheter is ready for use. No immediate complications. IMPRESSION: Ultrasound and fluoroscopically guided right internal jugular tunneled hemodialysis catheter (23 cm tip to cuff palindrome catheter). Electronically Signed   By: [name].  Shick M.D.   On: 01/01/2020 12:13   IR US Guide Vasc Access Right  Result Date: 01/01/2020 INDICATION: End-stage renal disease, no current access EXAM: ULTRASOUND GUIDANCE FOR VASCULAR ACCESS RIGHT INTERNAL JUGULAR PERMANENT HEMODIALYSIS CATHETER Date:  01/01/2020 01/01/2020 12:03 pm Radiologist:  [name]. Daryll Brod, MD Guidance:  Ultrasound and fluoroscopic FLUOROSCOPY TIME:  Fluoroscopy Time: 0 minutes 42 seconds (31 mGy). MEDICATIONS: Ancef 2 g within 1 hour of the procedure ANESTHESIA/SEDATION: Versed 1.0 mg IV; Fentanyl 25 mcg IV; Moderate Sedation Time:  13 minutes The patient was continuously monitored during the procedure by the interventional radiology nurse under my direct supervision. CONTRAST:  None. COMPLICATIONS: None immediate. PROCEDURE: Informed consent was obtained from the patient following explanation of the procedure, risks, benefits and alternatives. The patient understands, agrees and consents for the procedure. All questions were addressed. A time out was performed. Maximal barrier sterile technique utilized including caps, mask, sterile gowns, sterile gloves, large sterile drape, hand hygiene, and 2%  chlorhexidine scrub. Under sterile conditions and local anesthesia, right internal jugular micropuncture venous access was performed with ultrasound. Images were obtained for documentation of the patent right internal jugular vein. A guide wire was inserted followed by a transitional dilator. Next, a 0.035 guidewire was advanced into the IVC with a 5-French catheter. Measurements were obtained from the right venotomy site to the proximal right atrium. In the right infraclavicular chest, a subcutaneous tunnel was created under sterile conditions and local anesthesia. 1% lidocaine with epinephrine was utilized for this. The 23 cm tip to cuff palindrome catheter was tunneled subcutaneously to the venotomy site and inserted into the SVC/RA junction through a valved peel-away sheath. Position was confirmed with fluoroscopy. Images were obtained for documentation. Blood was aspirated from the catheter followed by saline and heparin flushes. The appropriate volume and strength of heparin was instilled in each lumen. Caps were applied. The catheter was secured at the tunnel site with Gelfoam and a pursestring suture. The venotomy site was closed with subcuticular Vicryl suture. Dermabond  was applied to the small right neck incision. A dry sterile dressing was applied. The catheter is ready for use. No immediate complications. IMPRESSION: Ultrasound and fluoroscopically guided right internal jugular tunneled hemodialysis catheter (23 cm tip to cuff palindrome catheter). Electronically Signed   By: [name].  Shick M.D.   On: 01/01/2020 12:13   . amLODipine  10 mg Oral Daily  . calcitRIOL  0.25 mcg Oral Q M,W,F  . carvedilol  12.5 mg Oral BID WC  . Chlorhexidine Gluconate Cloth  6 each Topical Q0600  . [START ON 01/07/2020] darbepoetin (ARANESP) injection - DIALYSIS  40 mcg Intravenous Q Thu-HD  . gabapentin  300 mg Oral Daily  . heparin  5,000 Units Subcutaneous Q8H  . insulin aspart  0-5 Units Subcutaneous QHS  . insulin  aspart  0-6 Units Subcutaneous TID WC  . sevelamer carbonate  800 mg Oral TID WC    BMET    Component Value Date/Time   NA 136 01/03/2020 0212   K 3.6 01/03/2020 0212   CL 102 01/03/2020 0212   CO2 20 (L) 01/03/2020 0212   GLUCOSE 186 (H) 01/03/2020 0212   BUN 69 (H) 01/03/2020 0212   CREATININE 9.49 (H) 01/03/2020 0212   CALCIUM 9.0 01/03/2020 0212   GFRNONAA 6 (L) 01/03/2020 0212   GFRAA 7 (L) 01/03/2020 0212   CBC    Component Value Date/Time   WBC 9.9 01/03/2020 0212   RBC 2.97 (L) 01/03/2020 0212   HGB 8.5 (L) 01/03/2020 0212   HCT 26.9 (L) 01/03/2020 0212   PLT 419 (H) 01/03/2020 0212   MCV 90.6 01/03/2020 0212   MCH 28.6 01/03/2020 0212   MCHC 31.6 01/03/2020 0212   RDW 14.1 01/03/2020 0212   LYMPHSABS 2.2 07/27/2017 1721   MONOABS 0.2 07/27/2017 1721   EOSABS 0.0 07/27/2017 1721   BASOSABS 0.0 07/27/2017 1721     Assessment/Plan:  1. 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  1. S/p RIJ TDC placement on 01/01/20 and first session of HD following. 2. Plan for second treatment today and again on Monday. 3. Evaluated by Dr. Scot Dock and vein mapping today and possible access placement on 01/05/20 (was on Eliquis but on hold for now and resume after surgery) 4. CLIP for outpatient HD arrangements. 2. HTN- follow after UF with HD. 3. Chronic diastolic CHF- euvolemic on exam, cont beta-blocker 4. Metabolic acidosis due to #1 follow after HD 5. Anemia of CKD stage V- started on Aranesp 40 mcg IV with HD 01/01/20. follow iron stores. 6. Vascular access- was set up for AVF/AVG at end of October but will need one next week according to OR schedule with [facility]. 7. Secondary HPTH- on renvela and calcitriol 8. Chest pain- per primary 9. PAfib 10. OSA on CPAP  Donetta Potts, MD [employer] [phone]

## 2020-01-03 NOTE — Progress Notes (Signed)
VASCULAR LAB    Bilateral upper extremity vein mapping has been performed.  See CV proc for preliminary results.   Euna Armon, RVT 01/03/2020, 11:00 AM

## 2020-01-03 NOTE — Progress Notes (Addendum)
   VASCULAR SURGERY ASSESSMENT & PLAN:   STAGE V CHRONIC KIDNEY DISEASE: His vein map is pending.  Pending the results of his vein map I can decide if the IV needs to be moved to the right arm.  I have him tentatively scheduled for surgery on Tuesday.  He was on Eliquis and it looks like this is on hold.  Please do not restart till after surgery.   SUBJECTIVE:   No complaints this morning.  PHYSICAL EXAM:   Vitals:   01/02/20 1208 01/02/20 1308 01/02/20 1556 01/02/20 2131  BP:   (!) 144/96 140/75  Pulse:   86 85  Resp: 15 18 20 17   Temp:   98.4 F (36.9 C) 98.5 F (36.9 C)  TempSrc:    Oral  SpO2:   93% 96%  Weight:      Height:       Palpable radial pulses.  LABS:   Lab Results  Component Value Date   WBC 9.9 01/03/2020   HGB 8.5 (L) 01/03/2020   HCT 26.9 (L) 01/03/2020   MCV 90.6 01/03/2020   PLT 419 (H) 01/03/2020   Lab Results  Component Value Date   CREATININE 9.49 (H) 01/03/2020   Lab Results  Component Value Date   INR 1.14 05/16/2017   CBG (last 3)  Recent Labs    01/02/20 1213 01/02/20 1631 01/02/20 2130  GLUCAP 158* 146* 146*    PROBLEM LIST:    Principal Problem:   Acute renal failure superimposed on stage 5 chronic kidney disease, not on chronic dialysis ([place]) Active Problems:   CKD (chronic kidney disease), stage V (HCC)   Essential hypertension, malignant   Diabetes mellitus type 2 in nonobese (HCC)   Acute diastolic congestive heart failure (HCC)   Chronic ulcer of right leg (HCC)   AF (paroxysmal atrial fibrillation) (HCC)   ESRD (end stage renal disease) (HCC)   CURRENT MEDS:   . amLODipine  10 mg Oral Daily  . calcitRIOL  0.25 mcg Oral Q M,W,F  . carvedilol  12.5 mg Oral BID WC  . Chlorhexidine Gluconate Cloth  6 each Topical Q0600  . [START ON 01/07/2020] darbepoetin (ARANESP) injection - DIALYSIS  40 mcg Intravenous Q Thu-HD  . gabapentin  300 mg Oral Daily  . heparin  5,000 Units Subcutaneous Q8H  . insulin aspart  0-5  Units Subcutaneous QHS  . insulin aspart  0-6 Units Subcutaneous TID WC  . sevelamer carbonate  800 mg Oral TID WC    [name] Office: [phone] 01/03/2020

## 2020-01-03 NOTE — Progress Notes (Signed)
PROGRESS NOTE    [name]  FKC:[id number] DOB: Mar 05, 1966 DOA: 12/30/2019 PCP: Bartholome Bill, MD   Brief Narrative:  [name] is a 54 y.o. male with medical history significant of chronic kidney disease stage IV, diabetes, hypertension, right leg wound, obstructive sleep apnea on CPAP, hypertensive encephalopathy and adjustment disorder who is being followed by nephrology for advanced renal disease not yet requiring hemodialysis.  Patient was sent from wound care center where he went for his routine appointment today. While there he was noted to have worsening renal function. Patient arrived the ER where his BUN/creatinine were noted to be higher than his baseline.  His renal function showed BUN of 82 and creatinine 10.43.  Previous creatinine was 8.23.  Potassium however is normal at 4.1.  Admitted to the hospital for evaluation by nephrology.  Assessment & Plan:   Principal Problem:   Acute renal failure superimposed on stage 5 chronic kidney disease, not on chronic dialysis (HCC) Active Problems:   CKD (chronic kidney disease), stage V (HCC)   Essential hypertension, malignant   Diabetes mellitus type 2 in nonobese (HCC)   Acute diastolic congestive heart failure (HCC)   Chronic ulcer of right leg (HCC)   AF (paroxysmal atrial fibrillation) (HCC)   ESRD (end stage renal disease) (HCC)   Acute on chronic kidney disease stage V - progressing now ESRD, POA:  - Nephrology following, patient now progressing to ESRD dialysis per their schedule - Tolerated first dialysis on 01/01/2020 without any complication or issue - Status post RIJ dialysis catheter - subsequent dialysis per nephrology schedule. - AV fistula/graft initially planned end of October, tentatively scheduled for intervention later this week 01/07/2020 -if stable for discharge could consider during hospital versus outpatient fistula evaluation/placement.  Reported paroxysmal A. fib, sinus rhythm since admission:    - Unclear afib burden; reports being started on anticoagulation earlier this year by PCP - Currently normal sinus rhythm; no indication for anticoagulation given questionable transient or provoked A. fib given our discussion at bedside - Hold anticoagulation for vascular surgery planned 01/07/20  Insulin-dependent diabetes type 2, uncontrolled, hyperglycemia, POA  - Continue sliding scale insulin, hypoglycemic protocol - Home insulin appears to be 70/30 25 units twice daily Lab Results  Component Value Date   HGBA1C 7.5 (H) 12/30/2019   Malignant hypertension, improving:  - Patient reports outpatient hypotension previously holding or skipping home medications which likely caused rebound malignant hypertension as above exacerbating kidney disease - Home regimen includes clonidine patch, Lasix 40 daily, hydralazine 100 every 6 hours - We have restarted patient's home amlodipine we will transition from labetalol to carvedilol for more blood pressure control -Place patient back on hydralazine low-dose and uptitrate up as necessary - Avoid clonidine given concern for rebound hypertension as well as avoid any nephrotoxic's given above  Chronic right lower extremity ulcer:  - Appears well-healing, continue wound care.  - Patient complaining of neuropathic pain in this area, will increase gabapentin to 3 times daily - Wound does not appear to be acute or inflamed, would not administer IV narcotics despite patient's specific request  Obstructive sleep apnea:  - Patient reportedly has been on CPAP - offered to use in hospital if patient agreeable  DVT prophylaxis: Heparin Code Status: Full code Family Communication: None present  Status is: Inpatient  Dispo: The patient is from: Home              Anticipated d/c is to: To be determined  Anticipated d/c date is: Likely 24 to 48 hours pending clinical course              Patient currently not medically stable for discharge given  ongoing need for dialysis, safe disposition with dialysis slot.  Consultants:   Neprhology: Dr. Daylene Katayama  Procedures:   None  Antimicrobials:  None indicated  Subjective: No acute issues or events overnight, patient feels quite well today, resting comfortably denies nausea, vomiting, diarrhea, constipation, headache, fevers, chills.  Objective: Vitals:   01/02/20 1208 01/02/20 1308 01/02/20 1556 01/02/20 2131  BP:   (!) 144/96 140/75  Pulse:   86 85  Resp: 15 18 20 17   Temp:   98.4 F (36.9 C) 98.5 F (36.9 C)  TempSrc:    Oral  SpO2:   93% 96%  Weight:      Height:       No intake or output data in the 24 hours ending 01/03/20 0740 Filed Weights   01/01/20 0500 01/01/20 1635 01/01/20 1845  Weight: 108.2 kg 108.9 kg 108.9 kg    Examination:  General:  Pleasantly resting in bed, No acute distress. HEENT:  Normocephalic atraumatic.  Sclerae nonicteric, noninjected.  Extraocular movements intact bilaterally. Neck:  Without mass or deformity.  Trachea is midline.  Right IJ dialysis catheter bandage clean dry intact. Lungs:  Clear to auscultate bilaterally without rhonchi, wheeze, or rales. Heart:  Regular rate and rhythm.  Without murmurs, rubs, or gallops. Abdomen:  Soft, nontender, nondistended.  Without guarding or rebound. Extremities: Without cyanosis, clubbing, edema, or obvious deformity. Vascular:  Dorsalis pedis and posterior tibial pulses palpable bilaterally. Skin:  Warm and dry, no erythema, chronic 2 x 5 cm anterior right tibial wound well-healed with granulation tissue, fat pad visible.   Data Reviewed: I have personally reviewed following labs and imaging studies  CBC: Recent Labs  Lab 12/30/19 1023 12/30/19 1731 12/30/19 1923 12/31/19 0500 01/03/20 0212  WBC 10.7*  --  12.5* 10.3 9.9  HGB 9.4* 9.9* 9.3* 9.3* 8.5*  HCT 30.2* 29.0* 29.4* 29.5* 26.9*  MCV 90.7  --  92.5 91.9 90.6  PLT 440*  --  457* 470* 710*   Basic Metabolic  Panel: Recent Labs  Lab 12/30/19 1023 12/30/19 1731 12/30/19 1923 12/31/19 0500 01/01/20 0120 01/03/20 0212  NA 135 137  --  136 135 136  K 4.1 3.9  --  3.7 3.5 3.6  CL 103  --   --  103 103 102  CO2 16*  --   --  16* 18* 20*  GLUCOSE 184*  --   --  158* 164* 186*  BUN 82*  --   --  85* 88* 69*  CREATININE 10.43*  --  10.49* 10.50* 10.73* 9.49*  CALCIUM 9.1  --   --  9.0 8.7* 9.0  PHOS  --   --   --  7.0* 7.3*  --    GFR: Estimated Creatinine Clearance: 10.6 mL/min (A) (by C-G formula based on SCr of 9.49 mg/dL (H)). Liver Function Tests: Recent Labs  Lab 12/31/19 0500 01/01/20 0120  ALBUMIN 2.6* 2.4*   No results for input(s): LIPASE, AMYLASE in the last 168 hours. No results for input(s): AMMONIA in the last 168 hours. Coagulation Profile: No results for input(s): INR, PROTIME in the last 168 hours. Cardiac Enzymes: No results for input(s): CKTOTAL, CKMB, CKMBINDEX, TROPONINI in the last 168 hours. BNP (last 3 results) No results for input(s): PROBNP in the last 8760 hours.  HbA1C: No results for input(s): HGBA1C in the last 72 hours. CBG: Recent Labs  Lab 01/01/20 2148 01/02/20 0732 01/02/20 1213 01/02/20 1631 01/02/20 2130  GLUCAP 253* 117* 158* 146* 146*   Lipid Profile: No results for input(s): CHOL, HDL, LDLCALC, TRIG, CHOLHDL, LDLDIRECT in the last 72 hours. Thyroid Function Tests: No results for input(s): TSH, T4TOTAL, FREET4, T3FREE, THYROIDAB in the last 72 hours. Anemia Panel: Recent Labs    12/31/19 2037  FERRITIN 175  TIBC 196*  IRON 60   Sepsis Labs: Recent Labs  Lab 12/30/19 1721  LATICACIDVEN 0.7    Recent Results (from the past 240 hour(s))  SARS Coronavirus 2 by RT PCR (hospital order, performed in [facility] hospital lab) Nasopharyngeal Nasopharyngeal Swab     Status: None   Collection Time: 12/30/19  6:37 PM   Specimen: Nasopharyngeal Swab  Result Value Ref Range Status   SARS Coronavirus 2 NEGATIVE NEGATIVE Final     Comment: (NOTE) SARS-CoV-2 target nucleic acids are NOT DETECTED.  The SARS-CoV-2 RNA is generally detectable in upper and lower respiratory specimens during the acute phase of infection. The lowest concentration of SARS-CoV-2 viral copies this assay can detect is 250 copies / mL. A negative result does not preclude SARS-CoV-2 infection and should not be used as the sole basis for treatment or other patient management decisions.  A negative result may occur with improper specimen collection / handling, submission of specimen other than nasopharyngeal swab, presence of viral mutation(s) within the areas targeted by this assay, and inadequate number of viral copies (<250 copies / mL). A negative result must be combined with clinical observations, patient history, and epidemiological information.  Fact Sheet for Patients:   StrictlyIdeas.no  Fact Sheet for Healthcare Providers: BankingDealers.co.za  This test is not yet approved or  cleared by the [place] FDA and has been authorized for detection and/or diagnosis of SARS-CoV-2 by FDA under an Emergency Use Authorization (EUA).  This EUA will remain in effect (meaning this test can be used) for the duration of the COVID-19 declaration under Section 564(b)(1) of the Act, 21 U.S.C. section 360bbb-3(b)(1), unless the authorization is terminated or revoked sooner.  Performed at [facility], [place] [address] [place], [place] [address]          Radiology Studies: IR Fluoro Guide CV Line Right  Result Date: 01/01/2020 INDICATION: End-stage renal disease, no current access EXAM: ULTRASOUND GUIDANCE FOR VASCULAR ACCESS RIGHT INTERNAL JUGULAR PERMANENT HEMODIALYSIS CATHETER Date:  01/01/2020 01/01/2020 12:03 pm Radiologist:  [name]. Daryll Brod, MD Guidance:  Ultrasound and fluoroscopic FLUOROSCOPY TIME:  Fluoroscopy Time: 0 minutes 42 seconds (31 mGy). MEDICATIONS: Ancef 2 g within 1  hour of the procedure ANESTHESIA/SEDATION: Versed 1.0 mg IV; Fentanyl 25 mcg IV; Moderate Sedation Time:  13 minutes The patient was continuously monitored during the procedure by the interventional radiology nurse under my direct supervision. CONTRAST:  None. COMPLICATIONS: None immediate. PROCEDURE: Informed consent was obtained from the patient following explanation of the procedure, risks, benefits and alternatives. The patient understands, agrees and consents for the procedure. All questions were addressed. A time out was performed. Maximal barrier sterile technique utilized including caps, mask, sterile gowns, sterile gloves, large sterile drape, hand hygiene, and 2% chlorhexidine scrub. Under sterile conditions and local anesthesia, right internal jugular micropuncture venous access was performed with ultrasound. Images were obtained for documentation of the patent right internal jugular vein. A guide wire was inserted followed by a transitional dilator. Next, a  0.035 guidewire was advanced into the IVC with a 5-French catheter. Measurements were obtained from the right venotomy site to the proximal right atrium. In the right infraclavicular chest, a subcutaneous tunnel was created under sterile conditions and local anesthesia. 1% lidocaine with epinephrine was utilized for this. The 23 cm tip to cuff palindrome catheter was tunneled subcutaneously to the venotomy site and inserted into the SVC/RA junction through a valved peel-away sheath. Position was confirmed with fluoroscopy. Images were obtained for documentation. Blood was aspirated from the catheter followed by saline and heparin flushes. The appropriate volume and strength of heparin was instilled in each lumen. Caps were applied. The catheter was secured at the tunnel site with Gelfoam and a pursestring suture. The venotomy site was closed with subcuticular Vicryl suture. Dermabond was applied to the small right neck incision. A dry sterile dressing  was applied. The catheter is ready for use. No immediate complications. IMPRESSION: Ultrasound and fluoroscopically guided right internal jugular tunneled hemodialysis catheter (23 cm tip to cuff palindrome catheter). Electronically Signed   By: [name].  Shick M.D.   On: 01/01/2020 12:13   IR US Guide Vasc Access Right  Result Date: 01/01/2020 INDICATION: End-stage renal disease, no current access EXAM: ULTRASOUND GUIDANCE FOR VASCULAR ACCESS RIGHT INTERNAL JUGULAR PERMANENT HEMODIALYSIS CATHETER Date:  01/01/2020 01/01/2020 12:03 pm Radiologist:  [name]. Daryll Brod, MD Guidance:  Ultrasound and fluoroscopic FLUOROSCOPY TIME:  Fluoroscopy Time: 0 minutes 42 seconds (31 mGy). MEDICATIONS: Ancef 2 g within 1 hour of the procedure ANESTHESIA/SEDATION: Versed 1.0 mg IV; Fentanyl 25 mcg IV; Moderate Sedation Time:  13 minutes The patient was continuously monitored during the procedure by the interventional radiology nurse under my direct supervision. CONTRAST:  None. COMPLICATIONS: None immediate. PROCEDURE: Informed consent was obtained from the patient following explanation of the procedure, risks, benefits and alternatives. The patient understands, agrees and consents for the procedure. All questions were addressed. A time out was performed. Maximal barrier sterile technique utilized including caps, mask, sterile gowns, sterile gloves, large sterile drape, hand hygiene, and 2% chlorhexidine scrub. Under sterile conditions and local anesthesia, right internal jugular micropuncture venous access was performed with ultrasound. Images were obtained for documentation of the patent right internal jugular vein. A guide wire was inserted followed by a transitional dilator. Next, a 0.035 guidewire was advanced into the IVC with a 5-French catheter. Measurements were obtained from the right venotomy site to the proximal right atrium. In the right infraclavicular chest, a subcutaneous tunnel was created under sterile conditions and  local anesthesia. 1% lidocaine with epinephrine was utilized for this. The 23 cm tip to cuff palindrome catheter was tunneled subcutaneously to the venotomy site and inserted into the SVC/RA junction through a valved peel-away sheath. Position was confirmed with fluoroscopy. Images were obtained for documentation. Blood was aspirated from the catheter followed by saline and heparin flushes. The appropriate volume and strength of heparin was instilled in each lumen. Caps were applied. The catheter was secured at the tunnel site with Gelfoam and a pursestring suture. The venotomy site was closed with subcuticular Vicryl suture. Dermabond was applied to the small right neck incision. A dry sterile dressing was applied. The catheter is ready for use. No immediate complications. IMPRESSION: Ultrasound and fluoroscopically guided right internal jugular tunneled hemodialysis catheter (23 cm tip to cuff palindrome catheter). Electronically Signed   By: [name].  Shick M.D.   On: 01/01/2020 12:13    Scheduled Meds: . amLODipine  10 mg Oral Daily  . calcitRIOL  0.25 mcg Oral Q M,W,F  . carvedilol  12.5 mg Oral BID WC  . Chlorhexidine Gluconate Cloth  6 each Topical Q0600  . [START ON 01/07/2020] darbepoetin (ARANESP) injection - DIALYSIS  40 mcg Intravenous Q Thu-HD  . gabapentin  300 mg Oral Daily  . heparin  5,000 Units Subcutaneous Q8H  . insulin aspart  0-5 Units Subcutaneous QHS  . insulin aspart  0-6 Units Subcutaneous TID WC  . sevelamer carbonate  800 mg Oral TID WC    LOS: 4 days   Time spent: 72min  Servando Kyllonen C Senie Lanese, DO [facility]  If 7PM-7AM, please contact night-coverage [email or web address]  01/03/2020, 7:40 AM

## 2020-01-03 NOTE — Progress Notes (Signed)
   01/03/20 2331  Hand-Off documentation  Handoff Given Given to shift RN/LPN  Report given to (Full Name) Cinthia, RN  Handoff Received Received from shift RN/LPN  Report received from (Full Name) Lorain Fettes, RN  Vital Signs  Temp 98.2 F (36.8 C)  Temp Source Oral  Pulse Rate 87  Pulse Rate Source Monitor  Resp 16  BP (!) 157/110  BP Location Right Arm  BP Method Automatic  Patient Position (if appropriate) Lying  Oxygen Therapy  SpO2 100 %  O2 Device Room Air  Pain Assessment  Pain Scale 0-10  Pain Score 0  Dialysis Weight  Weight 106.7 kg  Type of Weight Post-Dialysis  Post-Hemodialysis Assessment  Rinseback Volume (mL) 250 mL  KECN 206 V  Dialyzer Clearance Lightly streaked  Duration of HD Treatment -hour(s) 2.5 hour(s)  Hemodialysis Intake (mL) 500 mL  UF Total -Machine (mL) 2503 mL  Net UF (mL) 2003 mL  Tolerated HD Treatment Yes  Post-Hemodialysis Comments tx achieved as expected, tolerated well. no complaints.  AVG/AVF Arterial Site Held (minutes) 0 minutes  AVG/AVF Venous Site Held (minutes) 0 minutes  Education / Care Plan  Dialysis Education Provided Yes  Documented Education in Care Plan Yes  Outpatient Plan of Care Reviewed and on Chart Yes  Hemodialysis Catheter Right Internal jugular Double lumen Permanent (Tunneled)  Placement Date/Time: 01/01/20 1158   Time Out: Correct patient;Correct site  Maximum sterile barrier precautions: Hand hygiene;Cap;Mask;Sterile gown;Large sterile sheet;Sterile gloves  Site Prep: Chlorhexidine (preferred);Skin Prep Completely Dry at t...  Site Condition No complications  Blue Lumen Status Heparin locked;Capped (Central line)  Red Lumen Status Heparin locked;Capped (Central line)  Catheter fill solution Heparin 1000 units/ml  Catheter fill volume (Arterial) 1.9 cc  Catheter fill volume (Venous) 1.9  Dressing Type Occlusive  Dressing Status Clean;Dry;Intact  Interventions Dressing changed  Drainage Description None   Post treatment catheter status Capped and Clamped

## 2020-01-04 DIAGNOSIS — N186 End stage renal disease: Secondary | ICD-10-CM

## 2020-01-04 LAB — GLUCOSE, CAPILLARY
Glucose-Capillary: 132 mg/dL — ABNORMAL HIGH (ref 70–99)
Glucose-Capillary: 143 mg/dL — ABNORMAL HIGH (ref 70–99)
Glucose-Capillary: 149 mg/dL — ABNORMAL HIGH (ref 70–99)

## 2020-01-04 LAB — RENAL FUNCTION PANEL
Albumin: 2.6 g/dL — ABNORMAL LOW (ref 3.5–5.0)
Anion gap: 13 (ref 5–15)
BUN: 49 mg/dL — ABNORMAL HIGH (ref 6–20)
CO2: 22 mmol/L (ref 22–32)
Calcium: 9.1 mg/dL (ref 8.9–10.3)
Chloride: 102 mmol/L (ref 98–111)
Creatinine, Ser: 7.41 mg/dL — ABNORMAL HIGH (ref 0.61–1.24)
GFR calc Af Amer: 9 mL/min — ABNORMAL LOW (ref >60)
GFR calc non Af Amer: 8 mL/min — ABNORMAL LOW (ref >60)
Glucose, Bld: 131 mg/dL — ABNORMAL HIGH (ref 70–99)
Phosphorus: 5.3 mg/dL — ABNORMAL HIGH (ref 2.5–4.6)
Potassium: 3.7 mmol/L (ref 3.5–5.1)
Sodium: 137 mmol/L (ref 135–145)

## 2020-01-04 LAB — CBC
HCT: 28.3 % — ABNORMAL LOW (ref 39.0–52.0)
Hemoglobin: 8.8 g/dL — ABNORMAL LOW (ref 13.0–17.0)
MCH: 28.7 pg (ref 26.0–34.0)
MCHC: 31.1 g/dL (ref 30.0–36.0)
MCV: 92.2 fL (ref 80.0–100.0)
Platelets: 432 10*3/uL — ABNORMAL HIGH (ref 150–400)
RBC: 3.07 MIL/uL — ABNORMAL LOW (ref 4.22–5.81)
RDW: 13.9 % (ref 11.5–15.5)
WBC: 10.6 10*3/uL — ABNORMAL HIGH (ref 4.0–10.5)
nRBC: 0 % (ref 0.0–0.2)

## 2020-01-04 MED ORDER — HEPARIN SODIUM (PORCINE) 1000 UNIT/ML IJ SOLN
INTRAMUSCULAR | Status: AC
  Administered 2020-01-04: 1000 [IU]
  Filled 2020-01-04: qty 4

## 2020-01-04 MED ORDER — CALCITRIOL 0.25 MCG PO CAPS
ORAL_CAPSULE | ORAL | Status: AC
  Administered 2020-01-04: 0.25 ug
  Filled 2020-01-04: qty 1

## 2020-01-04 MED ORDER — CARVEDILOL 25 MG PO TABS
25.0000 mg | ORAL_TABLET | Freq: Two times a day (BID) | ORAL | Status: DC
  Administered 2020-01-04 – 2020-01-07 (×6): 25 mg via ORAL
  Filled 2020-01-04 (×6): qty 1

## 2020-01-04 MED ORDER — HEPARIN SODIUM (PORCINE) 1000 UNIT/ML DIALYSIS: 20.0000 [IU]/kg | INTRAMUSCULAR | Status: DC | PRN

## 2020-01-04 MED ORDER — LABETALOL HCL 5 MG/ML IV SOLN
10.0000 mg | INTRAVENOUS | Status: DC | PRN
  Administered 2020-01-04 – 2020-01-05 (×2): 10 mg via INTRAVENOUS
  Filled 2020-01-04 (×2): qty 4

## 2020-01-04 MED ORDER — CEFAZOLIN SODIUM-DEXTROSE 2-4 GM/100ML-% IV SOLN
2.0000 g | INTRAVENOUS | Status: AC
  Administered 2020-01-05: 2 g via INTRAVENOUS
  Filled 2020-01-04: qty 100

## 2020-01-04 MED ORDER — RENA-VITE PO TABS
1.0000 | ORAL_TABLET | Freq: Every day | ORAL | Status: DC
  Administered 2020-01-05 – 2020-01-06 (×3): 1 via ORAL
  Filled 2020-01-04 (×3): qty 1

## 2020-01-04 NOTE — Progress Notes (Signed)
   VASCULAR SURGERY ASSESSMENT & PLAN:   STAGE V CHRONIC KIDNEY DISEASE: Based on his vein map he appears to be a candidate for a left brachiocephalic fistula.  The second option for a fistula would be a basilic vein transposition which would likely be done in 2 stages.  I have previously discussed the indications for the procedure and the potential complications and he is agreeable to proceed.  His surgery is scheduled for tomorrow.  His Eliquis is being held.  Please do not do dialysis tomorrow morning given that he is scheduled for surgery.  I have written preop orders.  SUBJECTIVE:   No complaints.  PHYSICAL EXAM:   Vitals:   01/04/20 0056 01/04/20 0103 01/04/20 0343 01/04/20 0553  BP: (!) 188/118 (!) 160/110 (!) 184/109 (!) 171/94  Pulse: 85  84   Resp: 17  16   Temp: 98.6 F (37 C)     TempSrc: Oral     SpO2: 100%     Weight:      Height:       His IV has been moved to the right arm. Palpable left radial pulse.  LABS:   VEIN MAP: I have independently interpreted his vein map that was done yesterday.  This shows that his upper arm cephalic vein on the left looks reasonable.  Second option for a fistula would be a basilic vein transposition which I explained would likely be done in 2 stages.  Lab Results  Component Value Date   WBC 9.9 01/03/2020   HGB 8.5 (L) 01/03/2020   HCT 26.9 (L) 01/03/2020   MCV 90.6 01/03/2020   PLT 419 (H) 01/03/2020   CBG (last 3)  Recent Labs    01/03/20 1355 01/03/20 1620 01/03/20 2357  GLUCAP 136* 138* 149*    PROBLEM LIST:    Principal Problem:   Acute renal failure superimposed on stage 5 chronic kidney disease, not on chronic dialysis (HCC) Active Problems:   CKD (chronic kidney disease), stage V (HCC)   Essential hypertension, malignant   Diabetes mellitus type 2 in nonobese (HCC)   Acute diastolic congestive heart failure (HCC)   Chronic ulcer of right leg (HCC)   AF (paroxysmal atrial fibrillation) (HCC)   ESRD (end  stage renal disease) (HCC)   CURRENT MEDS:   . amLODipine  10 mg Oral Daily  . calcitRIOL  0.25 mcg Oral Q M,W,F  . carvedilol  12.5 mg Oral BID WC  . Chlorhexidine Gluconate Cloth  6 each Topical Q0600  . [START ON 01/07/2020] darbepoetin (ARANESP) injection - DIALYSIS  40 mcg Intravenous Q Thu-HD  . gabapentin  300 mg Oral Daily  . heparin  5,000 Units Subcutaneous Q8H  . heparin sodium (porcine)      . hydrALAZINE  25 mg Oral Q8H  . insulin aspart  0-5 Units Subcutaneous QHS  . insulin aspart  0-6 Units Subcutaneous TID WC  . sevelamer carbonate  800 mg Oral TID WC    [name] Office: [phone] 01/04/2020

## 2020-01-04 NOTE — Progress Notes (Signed)
PROGRESS NOTE    [name]  GTX:[id number] DOB: 1965/12/01 DOA: 12/30/2019 PCP: Bartholome Bill, MD   Brief Narrative:  [name] is a 54 y.o. male with medical history significant of chronic kidney disease stage IV, diabetes, hypertension, right leg wound, obstructive sleep apnea on CPAP, hypertensive encephalopathy and adjustment disorder who is being followed by nephrology for advanced renal disease not yet requiring hemodialysis.  Patient was sent from wound care center where he went for his routine appointment today. While there he was noted to have worsening renal function. Patient arrived the ER where his BUN/creatinine were noted to be higher than his baseline.  His renal function showed BUN of 82 and creatinine 10.43.  Previous creatinine was 8.23.  Potassium however is normal at 4.1.  Admitted to the hospital for evaluation by nephrology.  Assessment & Plan:   Principal Problem:   Acute renal failure superimposed on stage 5 chronic kidney disease, not on chronic dialysis (HCC) Active Problems:   CKD (chronic kidney disease), stage V (HCC)   Essential hypertension, malignant   Diabetes mellitus type 2 in nonobese (HCC)   Acute diastolic congestive heart failure (HCC)   Chronic ulcer of right leg (HCC)   AF (paroxysmal atrial fibrillation) (HCC)   ESRD (end stage renal disease) (HCC)  Acute on chronic kidney disease stage V - progressing now ESRD, POA:  - Nephrology following, patient now progressing to ESRD dialysis per their schedule - Tolerated first dialysis on 01/01/2020 without any complication or issue - Status post RIJ dialysis catheter - subsequent dialysis per nephrology schedule. - AV fistula/graft initially planned end of October, tentatively scheduled for intervention 01/05/2020  Reported paroxysmal A. fib, sinus rhythm since admission:  - Unclear afib burden; reports being started on anticoagulation earlier this year by PCP - Currently normal sinus  rhythm; no indication for anticoagulation given questionable transient or provoked A. fib given our discussion at bedside - Hold anticoagulation for vascular surgery planned 01/07/20  Insulin-dependent diabetes type 2, uncontrolled, hyperglycemia, POA  - Continue sliding scale insulin, hypoglycemic protocol - Home insulin appears to be 70/30 25 units twice daily Lab Results  Component Value Date   HGBA1C 7.5 (H) 12/30/2019   Malignant hypertension, improving:  - Patient reports outpatient hypotension previously holding or skipping home medications which likely caused rebound malignant hypertension as above exacerbating kidney disease - Home regimen includes clonidine patch, Lasix 40 daily, hydralazine 100 every 6 hours - We have restarted patient's home amlodipine and hydralazine we will transition from labetalol to carvedilol for more blood pressure control -increase carvedilol 01/04/2020 - Avoid clonidine given concern for rebound hypertension as well as avoid any nephrotoxic's given above  Chronic right lower extremity ulcer:  - Appears well-healing, continue wound care.  - Patient complaining of neuropathic pain in this area, will increase gabapentin to 3 times daily - Wound does not appear to be acute or inflamed, would not administer IV narcotics despite patient's specific request  Obstructive sleep apnea:  - Patient reportedly has been on CPAP - offered to use in hospital if patient agreeable  DVT prophylaxis: Heparin Code Status: Full code Family Communication: None present  Status is: Inpatient  Dispo: The patient is from: Home              Anticipated d/c is to: To be determined              Anticipated d/c date is: Likely 24 to 48 hours pending clinical course  Patient currently not medically stable for discharge given ongoing need for dialysis, safe disposition with dialysis slot.  Consultants:   Neprhology: Dr. Daylene Katayama  Procedures:   Dialysis  permacath placed, fistula grafting planned 01/05/2020  Antimicrobials:  None indicated  Subjective: No acute issues or events overnight, patient feels quite well today, resting comfortably denies nausea, vomiting, diarrhea, constipation, headache, fevers, chills.  Objective: Vitals:   01/04/20 0103 01/04/20 0343 01/04/20 0553 01/04/20 0723  BP: (!) 160/110 (!) 184/109 (!) 171/94 (!) 161/104  Pulse:  84  86  Resp:  16  17  Temp:    98.9 F (37.2 C)  TempSrc:      SpO2:    99%  Weight:      Height:        Intake/Output Summary (Last 24 hours) at 01/04/2020 0738 Last data filed at 01/03/2020 2331 Gross per 24 hour  Intake --  Output 2503 ml  Net -2503 ml   Filed Weights   01/01/20 1635 01/01/20 1845 01/03/20 2331  Weight: 108.9 kg 108.9 kg 106.7 kg    Examination:  General:  Pleasantly resting in bed, No acute distress. HEENT:  Normocephalic atraumatic.  Sclerae nonicteric, noninjected.  Extraocular movements intact bilaterally. Neck:  Without mass or deformity.  Trachea is midline.  Right IJ dialysis catheter bandage clean dry intact. Lungs:  Clear to auscultate bilaterally without rhonchi, wheeze, or rales. Heart:  Regular rate and rhythm.  Without murmurs, rubs, or gallops. Abdomen:  Soft, nontender, nondistended.  Without guarding or rebound. Extremities: Without cyanosis, clubbing, edema, or obvious deformity. Vascular:  Dorsalis pedis and posterior tibial pulses palpable bilaterally. Skin:  Warm and dry, no erythema, chronic 2 x 5 cm anterior right tibial wound well-healed with granulation tissue, fat pad visible.   Data Reviewed: I have personally reviewed following labs and imaging studies  CBC: Recent Labs  Lab 12/30/19 1023 12/30/19 1731 12/30/19 1923 12/31/19 0500 01/03/20 0212  WBC 10.7*  --  12.5* 10.3 9.9  HGB 9.4* 9.9* 9.3* 9.3* 8.5*  HCT 30.2* 29.0* 29.4* 29.5* 26.9*  MCV 90.7  --  92.5 91.9 90.6  PLT 440*  --  457* 470* 353*   Basic Metabolic  Panel: Recent Labs  Lab 12/30/19 1023 12/30/19 1731 12/30/19 1923 12/31/19 0500 01/01/20 0120 01/03/20 0212  NA 135 137  --  136 135 136  K 4.1 3.9  --  3.7 3.5 3.6  CL 103  --   --  103 103 102  CO2 16*  --   --  16* 18* 20*  GLUCOSE 184*  --   --  158* 164* 186*  BUN 82*  --   --  85* 88* 69*  CREATININE 10.43*  --  10.49* 10.50* 10.73* 9.49*  CALCIUM 9.1  --   --  9.0 8.7* 9.0  PHOS  --   --   --  7.0* 7.3*  --    GFR: Estimated Creatinine Clearance: 10.5 mL/min (A) (by C-G formula based on SCr of 9.49 mg/dL (H)). Liver Function Tests: Recent Labs  Lab 12/31/19 0500 01/01/20 0120  ALBUMIN 2.6* 2.4*   No results for input(s): LIPASE, AMYLASE in the last 168 hours. No results for input(s): AMMONIA in the last 168 hours. Coagulation Profile: No results for input(s): INR, PROTIME in the last 168 hours. Cardiac Enzymes: No results for input(s): CKTOTAL, CKMB, CKMBINDEX, TROPONINI in the last 168 hours. BNP (last 3 results) No results for input(s): PROBNP in the last 8760  hours. HbA1C: No results for input(s): HGBA1C in the last 72 hours. CBG: Recent Labs  Lab 01/03/20 1201 01/03/20 1355 01/03/20 1620 01/03/20 2357 01/04/20 0725  GLUCAP 149* 136* 138* 149* 132*   Lipid Profile: No results for input(s): CHOL, HDL, LDLCALC, TRIG, CHOLHDL, LDLDIRECT in the last 72 hours. Thyroid Function Tests: No results for input(s): TSH, T4TOTAL, FREET4, T3FREE, THYROIDAB in the last 72 hours. Anemia Panel: No results for input(s): VITAMINB12, FOLATE, FERRITIN, TIBC, IRON, RETICCTPCT in the last 72 hours. Sepsis Labs: Recent Labs  Lab 12/30/19 1721  LATICACIDVEN 0.7    Recent Results (from the past 240 hour(s))  SARS Coronavirus 2 by RT PCR (hospital order, performed in [facility] hospital lab) Nasopharyngeal Nasopharyngeal Swab     Status: None   Collection Time: 12/30/19  6:37 PM   Specimen: Nasopharyngeal Swab  Result Value Ref Range Status   SARS Coronavirus 2  NEGATIVE NEGATIVE Final    Comment: (NOTE) SARS-CoV-2 target nucleic acids are NOT DETECTED.  The SARS-CoV-2 RNA is generally detectable in upper and lower respiratory specimens during the acute phase of infection. The lowest concentration of SARS-CoV-2 viral copies this assay can detect is 250 copies / mL. A negative result does not preclude SARS-CoV-2 infection and should not be used as the sole basis for treatment or other patient management decisions.  A negative result may occur with improper specimen collection / handling, submission of specimen other than nasopharyngeal swab, presence of viral mutation(s) within the areas targeted by this assay, and inadequate number of viral copies (<250 copies / mL). A negative result must be combined with clinical observations, patient history, and epidemiological information.  Fact Sheet for Patients:   StrictlyIdeas.no  Fact Sheet for Healthcare Providers: BankingDealers.co.za  This test is not yet approved or  cleared by the [place] FDA and has been authorized for detection and/or diagnosis of SARS-CoV-2 by FDA under an Emergency Use Authorization (EUA).  This EUA will remain in effect (meaning this test can be used) for the duration of the COVID-19 declaration under Section 564(b)(1) of the Act, 21 U.S.C. section 360bbb-3(b)(1), unless the authorization is terminated or revoked sooner.  Performed at [facility], [place] [address], [place], [place] [address]          Radiology Studies: VAS [place] UPPER EXT VEIN MAPPING (PRE-OP AVF)  Result Date: 01/03/2020 UPPER EXTREMITY VEIN MAPPING  Indications: Pre-dialysis access. History: CKD V.  Limitations: IV/tape left forearm Comparison Study: No prior study Performing Technologist: Sharion Dove RVS  Examination Guidelines: A complete evaluation includes B-mode imaging, spectral Doppler, color Doppler, and power Doppler as needed of  all accessible portions of each vessel. Bilateral testing is considered an integral part of a complete examination. Limited examinations for reoccurring indications may be performed as noted. +-----------------+-------------+----------+---------+ Right Cephalic   Diameter (cm)Depth (cm)Findings  +-----------------+-------------+----------+---------+ Prox upper arm       0.39        0.55             +-----------------+-------------+----------+---------+ Mid upper arm        0.42        0.17             +-----------------+-------------+----------+---------+ Dist upper arm       0.37        0.16   branching +-----------------+-------------+----------+---------+ Antecubital fossa    0.65        0.27             +-----------------+-------------+----------+---------+  Prox forearm         0.40        0.57   branching +-----------------+-------------+----------+---------+ Mid forearm          0.35        0.41   branching +-----------------+-------------+----------+---------+ Dist forearm         0.33        0.44             +-----------------+-------------+----------+---------+ Wrist                0.25        0.46             +-----------------+-------------+----------+---------+ +-----------------+-------------+----------+-----------------------------------+ Left Cephalic    Diameter (cm)Depth (cm)             Findings               +-----------------+-------------+----------+-----------------------------------+ Prox upper arm       0.41        0.57                                       +-----------------+-------------+----------+-----------------------------------+ Mid upper arm        0.41        0.26                                       +-----------------+-------------+----------+-----------------------------------+ Dist upper arm       0.45        0.25                                        +-----------------+-------------+----------+-----------------------------------+ Antecubital fossa    0.47        0.37                                       +-----------------+-------------+----------+-----------------------------------+ Prox forearm         0.34        0.45   branching and between prox and mid                                               forearm (bandages in way)      +-----------------+-------------+----------+-----------------------------------+ Mid forearm          0.30        0.45                branching              +-----------------+-------------+----------+-----------------------------------+ Dist forearm         0.31        0.40                                       +-----------------+-------------+----------+-----------------------------------+ Wrist                0.23        0.47  branching              +-----------------+-------------+----------+-----------------------------------+ +-----------------+-------------+----------+---------+ Left Basilic     Diameter (cm)Depth (cm)Findings  +-----------------+-------------+----------+---------+ Prox upper arm       0.51        1.85    origin   +-----------------+-------------+----------+---------+ Mid upper arm        0.47        0.91             +-----------------+-------------+----------+---------+ Dist upper arm       0.40        0.49   branching +-----------------+-------------+----------+---------+ Antecubital fossa    0.39        0.18   branching +-----------------+-------------+----------+---------+ Prox forearm         0.35        0.24   branching +-----------------+-------------+----------+---------+ Mid forearm          0.27        0.16             +-----------------+-------------+----------+---------+ Distal forearm       0.20        0.24   branching +-----------------+-------------+----------+---------+ Wrist                0.16        0.27              +-----------------+-------------+----------+---------+ *See table(s) above for measurements and observations.  Diagnosing physician: [name] MD Electronically signed by [name] MD on 01/03/2020 at 2:01:46 PM.    Final     Scheduled Meds: . amLODipine  10 mg Oral Daily  . calcitRIOL  0.25 mcg Oral Q M,W,F  . carvedilol  25 mg Oral BID WC  . Chlorhexidine Gluconate Cloth  6 each Topical Q0600  . [START ON 01/07/2020] darbepoetin (ARANESP) injection - DIALYSIS  40 mcg Intravenous Q Thu-HD  . gabapentin  300 mg Oral Daily  . heparin  5,000 Units Subcutaneous Q8H  . heparin sodium (porcine)      . hydrALAZINE  25 mg Oral Q8H  . insulin aspart  0-5 Units Subcutaneous QHS  . insulin aspart  0-6 Units Subcutaneous TID WC  . multivitamin  1 tablet Oral QHS  . sevelamer carbonate  800 mg Oral TID WC    LOS: 5 days   Time spent: 29min  Arihaan Bellucci C Mehgan Santmyer, DO [facility]  If 7PM-7AM, please contact night-coverage [email or web address]  01/04/2020, 7:38 AM

## 2020-01-04 NOTE — H&P (View-Only) (Signed)
   VASCULAR SURGERY ASSESSMENT & PLAN:   STAGE V CHRONIC KIDNEY DISEASE: Based on his vein map he appears to be a candidate for a left brachiocephalic fistula.  The second option for a fistula would be a basilic vein transposition which would likely be done in 2 stages.  I have previously discussed the indications for the procedure and the potential complications and he is agreeable to proceed.  His surgery is scheduled for tomorrow.  His Eliquis is being held.  Please do not do dialysis tomorrow morning given that he is scheduled for surgery.  I have written preop orders.  SUBJECTIVE:   No complaints.  PHYSICAL EXAM:   Vitals:   01/04/20 0056 01/04/20 0103 01/04/20 0343 01/04/20 0553  BP: (!) 188/118 (!) 160/110 (!) 184/109 (!) 171/94  Pulse: 85  84   Resp: 17  16   Temp: 98.6 F (37 C)     TempSrc: Oral     SpO2: 100%     Weight:      Height:       His IV has been moved to the right arm. Palpable left radial pulse.  LABS:   VEIN MAP: I have independently interpreted his vein map that was done yesterday.  This shows that his upper arm cephalic vein on the left looks reasonable.  Second option for a fistula would be a basilic vein transposition which I explained would likely be done in 2 stages.  Lab Results  Component Value Date   WBC 9.9 01/03/2020   HGB 8.5 (L) 01/03/2020   HCT 26.9 (L) 01/03/2020   MCV 90.6 01/03/2020   PLT 419 (H) 01/03/2020   CBG (last 3)  Recent Labs    01/03/20 1355 01/03/20 1620 01/03/20 2357  GLUCAP 136* 138* 149*    PROBLEM LIST:    Principal Problem:   Acute renal failure superimposed on stage 5 chronic kidney disease, not on chronic dialysis (HCC) Active Problems:   CKD (chronic kidney disease), stage V (HCC)   Essential hypertension, malignant   Diabetes mellitus type 2 in nonobese (HCC)   Acute diastolic congestive heart failure (HCC)   Chronic ulcer of right leg (HCC)   AF (paroxysmal atrial fibrillation) (HCC)   ESRD (end  stage renal disease) (HCC)   CURRENT MEDS:   . amLODipine  10 mg Oral Daily  . calcitRIOL  0.25 mcg Oral Q M,W,F  . carvedilol  12.5 mg Oral BID WC  . Chlorhexidine Gluconate Cloth  6 each Topical Q0600  . [START ON 01/07/2020] darbepoetin (ARANESP) injection - DIALYSIS  40 mcg Intravenous Q Thu-HD  . gabapentin  300 mg Oral Daily  . heparin  5,000 Units Subcutaneous Q8H  . heparin sodium (porcine)      . hydrALAZINE  25 mg Oral Q8H  . insulin aspart  0-5 Units Subcutaneous QHS  . insulin aspart  0-6 Units Subcutaneous TID WC  . sevelamer carbonate  800 mg Oral TID WC    [name] Office: [phone] 01/04/2020

## 2020-01-04 NOTE — Progress Notes (Signed)
Patient ID: [name], male   DOB: [birth year], 55 y.o.   MRN: [id number] [facility] Progress Note   Assessment/ Plan:   1. End stage renal disease: Inpatient with progressive chronic kidney disease stage V secondary to underlying hypertension and diabetes and now started on dialysis via right IJ [facility] for uremic symptoms.  Plans noted for left brachiocephalic versus brachiobasilic (first stage) fistula tomorrow based on intraoperative findings.  He is on schedule for hemodialysis today.  Improving metabolic acidosis noted.  I will confirm that the process has been initiated for outpatient dialysis unit placement.   2.  Hypertension: Blood pressure noted to be elevated at this time, monitor with ultrafiltration on hemodialysis.  On antihypertensive therapy with amlodipine, carvedilol and hydralazine. 3.  Anemia of chronic kidney disease: Without overt blood loss, continue ESA-we will adjust dose.  Iron stores replete-TSAT 31%, ferritin 175. 4.  Secondary hyperparathyroidism: On calcitriol for PTH suppression and sevelamer for phosphorus binding, will continue to follow phosphorus trends. 5.  Paroxysmal atrial fibrillation: Eliquis currently on hold to allow for dialysis access surgery tomorrow.  Subjective:   Denies any chest pain or shortness of breath, tolerated dialysis yesterday without problems.   Objective:   BP (!) 171/94   Pulse 84   Temp 98.6 F (37 C) (Oral)   Resp 16   Ht 5\' 7"  (1.702 m)   Wt 106.7 kg   SpO2 100%   BMI 36.84 kg/m   Intake/Output Summary (Last 24 hours) at 01/04/2020 9767 Last data filed at 01/03/2020 2331 Gross per 24 hour  Intake --  Output 2503 ml  Net -2503 ml   Weight change:   Physical Exam: Gen: Comfortably resting in bed CVS: Pulse regular rhythm, normal rate, S1 and S2 normal without any murmur or rub Resp: Clear to auscultation, no rales/rhonchi.  Right IJ TDC in place. Abd: Soft, obese, nontender, bowel sounds normal Ext: No  lower extremity edema  Imaging: VAS [place] UPPER EXT VEIN MAPPING (PRE-OP AVF)  Result Date: 01/03/2020 UPPER EXTREMITY VEIN MAPPING  Indications: Pre-dialysis access. History: CKD V.  Limitations: IV/tape left forearm Comparison Study: No prior study Performing Technologist: Sharion Dove RVS  Examination Guidelines: A complete evaluation includes B-mode imaging, spectral Doppler, color Doppler, and power Doppler as needed of all accessible portions of each vessel. Bilateral testing is considered an integral part of a complete examination. Limited examinations for reoccurring indications may be performed as noted. +-----------------+-------------+----------+---------+ Right Cephalic   Diameter (cm)Depth (cm)Findings  +-----------------+-------------+----------+---------+ Prox upper arm       0.39        0.55             +-----------------+-------------+----------+---------+ Mid upper arm        0.42        0.17             +-----------------+-------------+----------+---------+ Dist upper arm       0.37        0.16   branching +-----------------+-------------+----------+---------+ Antecubital fossa    0.65        0.27             +-----------------+-------------+----------+---------+ Prox forearm         0.40        0.57   branching +-----------------+-------------+----------+---------+ Mid forearm          0.35        0.41   branching +-----------------+-------------+----------+---------+ Dist forearm         0.33  0.44             +-----------------+-------------+----------+---------+ Wrist                0.25        0.46             +-----------------+-------------+----------+---------+ +-----------------+-------------+----------+-----------------------------------+ Left Cephalic    Diameter (cm)Depth (cm)             Findings               +-----------------+-------------+----------+-----------------------------------+ Prox upper arm       0.41         0.57                                       +-----------------+-------------+----------+-----------------------------------+ Mid upper arm        0.41        0.26                                       +-----------------+-------------+----------+-----------------------------------+ Dist upper arm       0.45        0.25                                       +-----------------+-------------+----------+-----------------------------------+ Antecubital fossa    0.47        0.37                                       +-----------------+-------------+----------+-----------------------------------+ Prox forearm         0.34        0.45   branching and between prox and mid                                               forearm (bandages in way)      +-----------------+-------------+----------+-----------------------------------+ Mid forearm          0.30        0.45                branching              +-----------------+-------------+----------+-----------------------------------+ Dist forearm         0.31        0.40                                       +-----------------+-------------+----------+-----------------------------------+ Wrist                0.23        0.47                branching              +-----------------+-------------+----------+-----------------------------------+ +-----------------+-------------+----------+---------+ Left Basilic     Diameter (cm)Depth (cm)Findings  +-----------------+-------------+----------+---------+ Prox upper arm       0.51        1.85    origin   +-----------------+-------------+----------+---------+ Mid upper  arm        0.47        0.91             +-----------------+-------------+----------+---------+ Dist upper arm       0.40        0.49   branching +-----------------+-------------+----------+---------+ Antecubital fossa    0.39        0.18   branching +-----------------+-------------+----------+---------+  Prox forearm         0.35        0.24   branching +-----------------+-------------+----------+---------+ Mid forearm          0.27        0.16             +-----------------+-------------+----------+---------+ Distal forearm       0.20        0.24   branching +-----------------+-------------+----------+---------+ Wrist                0.16        0.27             +-----------------+-------------+----------+---------+ *See table(s) above for measurements and observations.  Diagnosing physician: [name] MD Electronically signed by [name] MD on 01/03/2020 at 2:01:46 PM.    Final     Labs: BMET Recent Labs  Lab 12/30/19 1023 12/30/19 1731 12/30/19 1923 12/31/19 0500 01/01/20 0120 01/03/20 0212  NA 135 137  --  136 135 136  K 4.1 3.9  --  3.7 3.5 3.6  CL 103  --   --  103 103 102  CO2 16*  --   --  16* 18* 20*  GLUCOSE 184*  --   --  158* 164* 186*  BUN 82*  --   --  85* 88* 69*  CREATININE 10.43*  --  10.49* 10.50* 10.73* 9.49*  CALCIUM 9.1  --   --  9.0 8.7* 9.0  PHOS  --   --   --  7.0* 7.3*  --    CBC Recent Labs  Lab 12/30/19 1023 12/30/19 1023 12/30/19 1731 12/30/19 1923 12/31/19 0500 01/03/20 0212  WBC 10.7*  --   --  12.5* 10.3 9.9  HGB 9.4*   < > 9.9* 9.3* 9.3* 8.5*  HCT 30.2*   < > 29.0* 29.4* 29.5* 26.9*  MCV 90.7  --   --  92.5 91.9 90.6  PLT 440*  --   --  457* 470* 419*   < > = values in this interval not displayed.    Medications:    . amLODipine  10 mg Oral Daily  . calcitRIOL  0.25 mcg Oral Q M,W,F  . carvedilol  12.5 mg Oral BID WC  . Chlorhexidine Gluconate Cloth  6 each Topical Q0600  . [START ON 01/07/2020] darbepoetin (ARANESP) injection - DIALYSIS  40 mcg Intravenous Q Thu-HD  . gabapentin  300 mg Oral Daily  . heparin  5,000 Units Subcutaneous Q8H  . heparin sodium (porcine)      . hydrALAZINE  25 mg Oral Q8H  . insulin aspart  0-5 Units Subcutaneous QHS  . insulin aspart  0-6 Units Subcutaneous TID WC   . sevelamer carbonate  800 mg Oral TID WC   Elmarie Shiley, MD 01/04/2020, 7:13 AM

## 2020-01-04 NOTE — Progress Notes (Signed)
Rounded on patient today in correlation to transition to outpatient HD. Ordered consult to dietician and Kidney Failure Book. Patient educated at the bedside regarding care of tunneled dialysis catheter, AV fistula/graft site care, and proper medication administration on HD days.  Patient also educated on the importance of attending all treatments as scheduled, even on sick days. Patient capable of verbalizing via teach back method. Educated patient on services are available to them through the interdisciplinary team in the clinic setting. Patient with questions in regard to transplantation and how that process works. Patient informed that the Nephrologists would be the best resource to guide that process. Handouts and contact information provided to patient for any further assistance. Will continue to follow as appropriate.   Dorthey Sawyer, RN  Dialysis Nurse Coordinator Phone: [phone]

## 2020-01-04 NOTE — Progress Notes (Signed)
Renal Navigator notes patient's initiation of HD and need for OP HD referral per documentation.  Navigator met with patient to discuss OP HD and submitted referral to [facility] to request treatment for ESRD at [facility] if there is a seat available at this time. Patient requests MWF/first shift, but is prepared that this may not be available and that he should ask to be put on a waiting list for something more desirable if he gets a schedule that does not fit his needs. Patient states interest in Home Therapy in the future, though does not seem to have much education regarding his options. Navigator provided brief education. He thinks he is interested in HHD and states his wife is a Marine scientist and can assist. Navigator states that he can receive more education at his OP HD clinic and be transitioned to Home Therapy when the time is right.  Renal Navigator will follow up once patient has been accepted for OP HD.  Alphonzo Cruise, [place] Renal Navigator [phone]

## 2020-01-04 NOTE — Progress Notes (Addendum)
Notified Dr Myna Hidalgo of elevated B/P upon pt's return from dialysis. Held B/P a.m. medications given with scheduled p.m. meds upon pt return to the floor. Awaiting further instruction.  0343 New orders received for Labetolol 10 mg IVP for Systolic > 484 and Diastolic >720. Current B/P 184/109, medication given at this time.  0553 B/P recheck -171/94, will cont to monitor.

## 2020-01-05 ENCOUNTER — Encounter (HOSPITAL_COMMUNITY): Payer: Self-pay | Admitting: Internal Medicine

## 2020-01-05 ENCOUNTER — Encounter (HOSPITAL_COMMUNITY)
Admission: EM | Disposition: A | Discharge status: Home Health | Payer: Self-pay | Source: Home / Self Care | Attending: Internal Medicine

## 2020-01-05 ENCOUNTER — Inpatient Hospital Stay (HOSPITAL_COMMUNITY): Payer: Medicare Other | Admitting: Certified Registered"

## 2020-01-05 DIAGNOSIS — N185 Chronic kidney disease, stage 5: Secondary | ICD-10-CM

## 2020-01-05 HISTORY — PX: AV FISTULA PLACEMENT: SHX1204

## 2020-01-05 LAB — GLUCOSE, CAPILLARY
Glucose-Capillary: 121 mg/dL — ABNORMAL HIGH (ref 70–99)
Glucose-Capillary: 123 mg/dL — ABNORMAL HIGH (ref 70–99)
Glucose-Capillary: 126 mg/dL — ABNORMAL HIGH (ref 70–99)
Glucose-Capillary: 126 mg/dL — ABNORMAL HIGH (ref 70–99)
Glucose-Capillary: 132 mg/dL — ABNORMAL HIGH (ref 70–99)
Glucose-Capillary: 141 mg/dL — ABNORMAL HIGH (ref 70–99)
Glucose-Capillary: 153 mg/dL — ABNORMAL HIGH (ref 70–99)

## 2020-01-05 LAB — MRSA PCR SCREENING: MRSA by PCR: POSITIVE — AB

## 2020-01-05 SURGERY — ARTERIOVENOUS (AV) FISTULA CREATION
Anesthesia: Monitor Anesthesia Care | Site: Arm Lower | Laterality: Left

## 2020-01-05 MED ORDER — SODIUM CHLORIDE 0.9 % IV SOLN
INTRAVENOUS | Status: AC
  Filled 2020-01-05: qty 1.2

## 2020-01-05 MED ORDER — LIDOCAINE-EPINEPHRINE (PF) 1 %-1:200000 IJ SOLN
INTRAMUSCULAR | Status: DC | PRN
  Administered 2020-01-05: 30 mL

## 2020-01-05 MED ORDER — ALBUTEROL SULFATE HFA 108 (90 BASE) MCG/ACT IN AERS
2.0000 | INHALATION_SPRAY | Freq: Four times a day (QID) | RESPIRATORY_TRACT | Status: DC | PRN
  Filled 2020-01-05: qty 6.7

## 2020-01-05 MED ORDER — ONDANSETRON HCL 4 MG/2ML IJ SOLN: 4.0000 mg | Freq: Once | INTRAMUSCULAR | Status: DC | PRN

## 2020-01-05 MED ORDER — PROPOFOL 500 MG/50ML IV EMUL
INTRAVENOUS | Status: DC | PRN
  Administered 2020-01-05: 100 ug/kg/min via INTRAVENOUS

## 2020-01-05 MED ORDER — VITAMIN D (ERGOCALCIFEROL) 1.25 MG (50000 UNIT) PO CAPS: 50000.0000 [IU] | ORAL_CAPSULE | ORAL | Status: DC

## 2020-01-05 MED ORDER — HEPARIN SODIUM (PORCINE) 1000 UNIT/ML IJ SOLN
INTRAMUSCULAR | Status: DC | PRN
  Administered 2020-01-05: 10000 [IU] via INTRAVENOUS

## 2020-01-05 MED ORDER — NEPRO/CARBSTEADY PO LIQD
237.0000 mL | Freq: Two times a day (BID) | ORAL | Status: DC
  Administered 2020-01-06 – 2020-01-07 (×2): 237 mL via ORAL

## 2020-01-05 MED ORDER — LIDOCAINE 2% (20 MG/ML) 5 ML SYRINGE
INTRAMUSCULAR | Status: AC
  Filled 2020-01-05: qty 10

## 2020-01-05 MED ORDER — OXYCODONE HCL 5 MG/5ML PO SOLN: 5.0000 mg | Freq: Once | ORAL | Status: DC | PRN

## 2020-01-05 MED ORDER — DEXMEDETOMIDINE (PRECEDEX) IN NS 20 MCG/5ML (4 MCG/ML) IV SYRINGE
PREFILLED_SYRINGE | INTRAVENOUS | Status: DC | PRN
  Administered 2020-01-05: 20 ug via INTRAVENOUS

## 2020-01-05 MED ORDER — POTASSIUM CHLORIDE CRYS ER 20 MEQ PO TBCR
20.0000 meq | EXTENDED_RELEASE_TABLET | Freq: Every morning | ORAL | Status: DC
  Administered 2020-01-05: 20 meq via ORAL
  Filled 2020-01-05: qty 1

## 2020-01-05 MED ORDER — PHENYLEPHRINE HCL (PRESSORS) 10 MG/ML IV SOLN
INTRAVENOUS | Status: DC | PRN
  Administered 2020-01-05: 100 ug via INTRAVENOUS

## 2020-01-05 MED ORDER — ORAL CARE MOUTH RINSE: 15.0000 mL | Freq: Once | OROMUCOSAL | Status: AC

## 2020-01-05 MED ORDER — CHLORHEXIDINE GLUCONATE 0.12 % MT SOLN
OROMUCOSAL | Status: AC
  Administered 2020-01-05: 15 mL via OROMUCOSAL
  Filled 2020-01-05: qty 15

## 2020-01-05 MED ORDER — LEVOCETIRIZINE DIHYDROCHLORIDE 5 MG PO TABS
5.0000 mg | ORAL_TABLET | Freq: Every day | ORAL | Status: DC
  Filled 2020-01-05: qty 1

## 2020-01-05 MED ORDER — LORATADINE 10 MG PO TABS
10.0000 mg | ORAL_TABLET | Freq: Every day | ORAL | Status: DC
  Administered 2020-01-05 – 2020-01-06 (×2): 10 mg via ORAL
  Filled 2020-01-05 (×2): qty 1

## 2020-01-05 MED ORDER — FENTANYL CITRATE (PF) 100 MCG/2ML IJ SOLN
INTRAMUSCULAR | Status: AC
Start: 2020-01-05 — End: 2020-01-05
  Filled 2020-01-05: qty 2

## 2020-01-05 MED ORDER — PROTAMINE SULFATE 10 MG/ML IV SOLN
INTRAVENOUS | Status: AC
  Filled 2020-01-05: qty 25

## 2020-01-05 MED ORDER — HYDROCODONE-ACETAMINOPHEN 7.5-325 MG PO TABS
1.0000 | ORAL_TABLET | ORAL | Status: DC | PRN
  Administered 2020-01-05 – 2020-01-07 (×7): 1 via ORAL
  Filled 2020-01-05 (×7): qty 1

## 2020-01-05 MED ORDER — TIZANIDINE HCL 4 MG PO TABS
4.0000 mg | ORAL_TABLET | Freq: Three times a day (TID) | ORAL | Status: DC | PRN
  Filled 2020-01-05: qty 1

## 2020-01-05 MED ORDER — PROTAMINE SULFATE 10 MG/ML IV SOLN
INTRAVENOUS | Status: DC | PRN
  Administered 2020-01-05: 50 mg via INTRAVENOUS

## 2020-01-05 MED ORDER — DEXMEDETOMIDINE (PRECEDEX) IN NS 20 MCG/5ML (4 MCG/ML) IV SYRINGE
PREFILLED_SYRINGE | INTRAVENOUS | Status: AC
  Filled 2020-01-05: qty 10

## 2020-01-05 MED ORDER — FENTANYL CITRATE (PF) 100 MCG/2ML IJ SOLN: 25.0000 ug | INTRAMUSCULAR | Status: DC | PRN

## 2020-01-05 MED ORDER — FUROSEMIDE 80 MG PO TABS
120.0000 mg | ORAL_TABLET | Freq: Every day | ORAL | Status: DC
  Administered 2020-01-05: 120 mg via ORAL
  Filled 2020-01-05: qty 1

## 2020-01-05 MED ORDER — FENTANYL CITRATE (PF) 100 MCG/2ML IJ SOLN
INTRAMUSCULAR | Status: DC | PRN
  Administered 2020-01-05 (×2): 25 ug via INTRAVENOUS
  Administered 2020-01-05: 50 ug via INTRAVENOUS

## 2020-01-05 MED ORDER — OXYCODONE HCL 5 MG PO TABS: 5.0000 mg | ORAL_TABLET | Freq: Once | ORAL | Status: DC | PRN

## 2020-01-05 MED ORDER — ADULT MULTIVITAMIN W/MINERALS CH: 1.0000 | ORAL_TABLET | Freq: Every day | ORAL | Status: DC

## 2020-01-05 MED ORDER — MIDAZOLAM HCL 2 MG/2ML IJ SOLN
INTRAMUSCULAR | Status: AC
  Filled 2020-01-05: qty 2

## 2020-01-05 MED ORDER — HYDRALAZINE HCL 50 MG PO TABS
50.0000 mg | ORAL_TABLET | Freq: Three times a day (TID) | ORAL | Status: DC
  Administered 2020-01-05 – 2020-01-07 (×4): 50 mg via ORAL
  Filled 2020-01-05 (×5): qty 1

## 2020-01-05 MED ORDER — GLYCOPYRROLATE PF 0.2 MG/ML IJ SOSY
PREFILLED_SYRINGE | INTRAMUSCULAR | Status: AC
  Filled 2020-01-05: qty 2

## 2020-01-05 MED ORDER — LIDOCAINE 2% (20 MG/ML) 5 ML SYRINGE
INTRAMUSCULAR | Status: DC | PRN
  Administered 2020-01-05: 60 mg via INTRAVENOUS

## 2020-01-05 MED ORDER — ONDANSETRON HCL 4 MG/2ML IJ SOLN
INTRAMUSCULAR | Status: AC
  Filled 2020-01-05: qty 2

## 2020-01-05 MED ORDER — TRAMADOL HCL 50 MG PO TABS
50.0000 mg | ORAL_TABLET | Freq: Three times a day (TID) | ORAL | Status: DC | PRN
  Administered 2020-01-05: 50 mg via ORAL
  Filled 2020-01-05: qty 1

## 2020-01-05 MED ORDER — INSULIN ASPART PROT & ASPART (70-30 MIX) 100 UNIT/ML ~~LOC~~ SUSP
25.0000 [IU] | Freq: Two times a day (BID) | SUBCUTANEOUS | Status: DC
  Administered 2020-01-05 – 2020-01-06 (×2): 25 [IU] via SUBCUTANEOUS
  Filled 2020-01-05: qty 10

## 2020-01-05 MED ORDER — GLYCOPYRROLATE 0.2 MG/ML IJ SOLN
INTRAMUSCULAR | Status: DC | PRN
  Administered 2020-01-05: .1 mg via INTRAVENOUS

## 2020-01-05 MED ORDER — 0.9 % SODIUM CHLORIDE (POUR BTL) OPTIME
TOPICAL | Status: DC | PRN
  Administered 2020-01-05: 1000 mL

## 2020-01-05 MED ORDER — CHLORHEXIDINE GLUCONATE 0.12 % MT SOLN: 15.0000 mL | Freq: Once | OROMUCOSAL | Status: AC

## 2020-01-05 MED ORDER — HYDRALAZINE HCL 50 MG PO TABS
100.0000 mg | ORAL_TABLET | Freq: Four times a day (QID) | ORAL | Status: DC
  Administered 2020-01-05 – 2020-01-06 (×4): 100 mg via ORAL
  Filled 2020-01-05 (×5): qty 2

## 2020-01-05 MED ORDER — MIDAZOLAM HCL 5 MG/5ML IJ SOLN
INTRAMUSCULAR | Status: DC | PRN
  Administered 2020-01-05: 2 mg via INTRAVENOUS

## 2020-01-05 MED ORDER — LIDOCAINE HCL (PF) 1 % IJ SOLN
INTRAMUSCULAR | Status: AC
  Filled 2020-01-05: qty 30

## 2020-01-05 MED ORDER — APIXABAN 5 MG PO TABS
5.0000 mg | ORAL_TABLET | Freq: Two times a day (BID) | ORAL | Status: DC
  Administered 2020-01-05 – 2020-01-07 (×5): 5 mg via ORAL
  Filled 2020-01-05 (×5): qty 1

## 2020-01-05 MED ORDER — LIDOCAINE-EPINEPHRINE (PF) 1 %-1:200000 IJ SOLN
INTRAMUSCULAR | Status: AC
  Filled 2020-01-05: qty 30

## 2020-01-05 MED ORDER — FLUTICASONE PROPIONATE 50 MCG/ACT NA SUSP
1.0000 | Freq: Two times a day (BID) | NASAL | Status: DC
  Administered 2020-01-05 – 2020-01-07 (×3): 1 via NASAL
  Filled 2020-01-05: qty 16

## 2020-01-05 MED ORDER — ACETAMINOPHEN 500 MG PO TABS: 1000.0000 mg | ORAL_TABLET | Freq: Two times a day (BID) | ORAL | Status: DC | PRN

## 2020-01-05 MED ORDER — SODIUM CHLORIDE 0.9 % IV SOLN: INTRAVENOUS | Status: DC

## 2020-01-05 MED ORDER — SODIUM CHLORIDE 0.9 % IV SOLN
INTRAVENOUS | Status: DC | PRN
  Administered 2020-01-05: 500 mL

## 2020-01-05 MED ORDER — HEPARIN SODIUM (PORCINE) 1000 UNIT/ML DIALYSIS
40.0000 [IU]/kg | INTRAMUSCULAR | Status: DC | PRN
  Administered 2020-01-06: 4100 [IU] via INTRAVENOUS_CENTRAL
  Filled 2020-01-05 (×2): qty 5

## 2020-01-05 MED ORDER — PROPOFOL 10 MG/ML IV BOLUS
INTRAVENOUS | Status: DC | PRN
  Administered 2020-01-05: 100 mg via INTRAVENOUS

## 2020-01-05 MED ORDER — CLONIDINE HCL 0.2 MG/24HR TD PTWK: 0.2000 mg | MEDICATED_PATCH | TRANSDERMAL | Status: DC

## 2020-01-05 SURGICAL SUPPLY — 35 items
ARMBAND PINK RESTRICT EXTREMIT (MISCELLANEOUS) ×4 IMPLANT
CANISTER SUCT 3000ML PPV (MISCELLANEOUS) ×2 IMPLANT
CANNULA VESSEL 3MM 2 BLNT TIP (CANNULA) ×2 IMPLANT
CLIP VESOCCLUDE MED 6/CT (CLIP) ×2 IMPLANT
CLIP VESOCCLUDE SM WIDE 6/CT (CLIP) ×2 IMPLANT
COVER PROBE W GEL 5X96 (DRAPES) IMPLANT
COVER WAND RF STERILE (DRAPES) ×1 IMPLANT
DECANTER SPIKE VIAL GLASS SM (MISCELLANEOUS) ×2 IMPLANT
DERMABOND ADVANCED (GAUZE/BANDAGES/DRESSINGS) ×1
DERMABOND ADVANCED .7 DNX12 (GAUZE/BANDAGES/DRESSINGS) ×1 IMPLANT
ELECT REM PT RETURN 9FT ADLT (ELECTROSURGICAL) ×2
ELECTRODE REM PT RTRN 9FT ADLT (ELECTROSURGICAL) ×1 IMPLANT
GLOVE BIO SURGEON STRL SZ 6.5 (GLOVE) ×1 IMPLANT
GLOVE BIO SURGEON STRL SZ7.5 (GLOVE) ×4 IMPLANT
GLOVE BIOGEL PI IND STRL 6.5 (GLOVE) IMPLANT
GLOVE BIOGEL PI IND STRL 7.5 (GLOVE) IMPLANT
GLOVE BIOGEL PI IND STRL 8 (GLOVE) ×1 IMPLANT
GLOVE BIOGEL PI INDICATOR 6.5 (GLOVE) ×3
GLOVE BIOGEL PI INDICATOR 7.5 (GLOVE) ×4
GLOVE BIOGEL PI INDICATOR 8 (GLOVE) ×1
GOWN STRL REUS W/ TWL LRG LVL3 (GOWN DISPOSABLE) ×3 IMPLANT
GOWN STRL REUS W/TWL LRG LVL3 (GOWN DISPOSABLE) ×6
KIT BASIN OR (CUSTOM PROCEDURE TRAY) ×2 IMPLANT
KIT TURNOVER KIT B (KITS) ×2 IMPLANT
NS IRRIG 1000ML POUR BTL (IV SOLUTION) ×2 IMPLANT
PACK CV ACCESS (CUSTOM PROCEDURE TRAY) ×2 IMPLANT
PAD ARMBOARD 7.5X6 YLW CONV (MISCELLANEOUS) ×4 IMPLANT
SPONGE SURGIFOAM ABS GEL 100 (HEMOSTASIS) IMPLANT
SUT PROLENE 6 0 BV (SUTURE) ×3 IMPLANT
SUT VIC AB 3-0 SH 27 (SUTURE) ×1
SUT VIC AB 3-0 SH 27X BRD (SUTURE) ×1 IMPLANT
SUT VICRYL 4-0 PS2 18IN ABS (SUTURE) ×2 IMPLANT
TOWEL GREEN STERILE (TOWEL DISPOSABLE) ×2 IMPLANT
UNDERPAD 30X36 HEAVY ABSORB (UNDERPADS AND DIAPERS) ×2 IMPLANT
WATER STERILE IRR 1000ML POUR (IV SOLUTION) ×2 IMPLANT

## 2020-01-05 NOTE — Interval H&P Note (Signed)
History and Physical Interval Note:  01/05/2020 9:26 AM  [name]  has presented today for surgery, with the diagnosis of END STAGE RENAL DISEASE.  The various methods of treatment have been discussed with the patient and family. After consideration of risks, benefits and other options for treatment, the patient has consented to  Procedure(s): ARTERIOVENOUS (AV) FISTULA VERSUS ARTERIOVENOUS GRAFT (Left) as a surgical intervention.  The patient's history has been reviewed, patient examined, no change in status, stable for surgery.  I have reviewed the patient's chart and labs.  Questions were answered to the patient's satisfaction.     [name]

## 2020-01-05 NOTE — Anesthesia Postprocedure Evaluation (Signed)
Anesthesia Post Note  Patient: [name]  Procedure(s) Performed: LEFT ARM BRACHIOCEPHALIC ARTERIOVENOUS (AV) FISTULA (Left Arm Lower)     Patient location during evaluation: PACU Anesthesia Type: MAC Level of consciousness: awake and alert Pain management: pain level controlled Vital Signs Assessment: post-procedure vital signs reviewed and stable Respiratory status: spontaneous breathing, nonlabored ventilation and respiratory function stable Cardiovascular status: stable and blood pressure returned to baseline Anesthetic complications: no   No complications documented.  Last Vitals:  Vitals:   01/05/20 1132 01/05/20 1240  BP: (!) 162/83 (!) 157/96  Pulse: 77 74  Resp: 20 14  Temp: 36.4 C 36.4 C  SpO2: 93% 96%    Last Pain:  Vitals:   01/05/20 1132  TempSrc:   PainSc: 0-No pain                 Audry Pili

## 2020-01-05 NOTE — Progress Notes (Signed)
Renal Navigator requested update from [facility] on OP HD referral submitted on patient yesterday. Navigator will continue to follow closely.  Alphonzo Cruise, [place] Renal Navigator [phone]

## 2020-01-05 NOTE — Anesthesia Preprocedure Evaluation (Addendum)
Anesthesia Evaluation  Patient identified by MRN, date of birth, ID band Patient awake    Reviewed: Allergy & Precautions, NPO status , Patient's Chart, lab work & pertinent test results, reviewed documented beta blocker date and time   History of Anesthesia Complications Negative for: history of anesthetic complications  Airway Mallampati: III  TM Distance: >3 FB Neck ROM: Full    Dental  (+) Dental Advisory Given, Teeth Intact   Pulmonary sleep apnea ,    Pulmonary exam normal        Cardiovascular hypertension, Pt. on medications and Pt. on home beta blockers +CHF  Normal cardiovascular exam   '19 TTE - moderate LVH. EF 60% to 65%. Grade 1 diastolicdysfunction.  LA was moderately dilated. Trivial PR, mild TR.    Neuro/Psych PSYCHIATRIC DISORDERS Anxiety Depression negative neurological ROS     GI/Hepatic negative GI ROS, Neg liver ROS,   Endo/Other  diabetes, Type 2, Insulin Dependent Obesity   Renal/GU ESRF and DialysisRenal disease     Musculoskeletal negative musculoskeletal ROS (+)   Abdominal   Peds  Hematology  (+) anemia ,  On eliquis    Anesthesia Other Findings Covid test negative   Reproductive/Obstetrics                            Anesthesia Physical Anesthesia Plan  ASA: III  Anesthesia Plan: MAC   Post-op Pain Management:    Induction: Intravenous  PONV Risk Score and Plan: 1 and Propofol infusion and Treatment may vary due to age or medical condition  Airway Management Planned: Natural Airway and Simple Face Mask  Additional Equipment: None  Intra-op Plan:   Post-operative Plan:   Informed Consent: I have reviewed the patients History and Physical, chart, labs and discussed the procedure including the risks, benefits and alternatives for the proposed anesthesia with the patient or authorized representative who has indicated his/her understanding and  acceptance.       Plan Discussed with: CRNA and Anesthesiologist  Anesthesia Plan Comments:        Anesthesia Quick Evaluation

## 2020-01-05 NOTE — Progress Notes (Signed)
PROGRESS NOTE    Brenen [name]  QAS:[id number] DOB: May 13, 1965 DOA: 12/30/2019 PCP: Bartholome Bill, MD   Brief Narrative:  [name] is a 54 y.o. male with medical history significant of chronic kidney disease stage IV, diabetes, hypertension, right leg wound, obstructive sleep apnea on CPAP, hypertensive encephalopathy and adjustment disorder who is being followed by nephrology for advanced renal disease not yet requiring hemodialysis.  Patient was sent from wound care center where he went for his routine appointment today. While there he was noted to have worsening renal function. Patient arrived the ER where his BUN/creatinine were noted to be higher than his baseline.  His renal function showed BUN of 82 and creatinine 10.43.  Previous creatinine was 8.23.  Potassium however is normal at 4.1.  Admitted to the hospital for evaluation by nephrology.  Assessment & Plan:   Principal Problem:   Acute renal failure superimposed on stage 5 chronic kidney disease, not on chronic dialysis (HCC) Active Problems:   CKD (chronic kidney disease), stage V (HCC)   Essential hypertension, malignant   Diabetes mellitus type 2 in nonobese (HCC)   Acute diastolic congestive heart failure (HCC)   Chronic ulcer of right leg (HCC)   AF (paroxysmal atrial fibrillation) (HCC)   ESRD (end stage renal disease) (HCC)   Acute on chronic kidney disease stage V - progressing now ESRD, POA:  - Nephrology following, patient now progressing to ESRD dialysis per their schedule - Tolerated first dialysis on 01/01/2020 without any complication or issue - Status post RIJ dialysis catheter - subsequent dialysis per nephrology schedule. - AV fistula/graft when later this morning with vascular heme, appreciate insight recommendations  Reported paroxysmal A. fib, sinus rhythm since admission:  - Unclear afib burden; reports being started on anticoagulation earlier this year by PCP - Currently normal sinus rhythm;  no indication for anticoagulation given questionable transient or provoked A. fib given our discussion at bedside - Hold anticoagulation for vascular surgery planned 01/07/20  Insulin-dependent diabetes type 2, uncontrolled, hyperglycemia, POA  - Continue sliding scale insulin, hypoglycemic protocol - Home insulin appears to be 70/30 25 units twice daily Lab Results  Component Value Date   HGBA1C 7.5 (H) 12/30/2019   Malignant hypertension, improving:  - Patient reports outpatient hypotension previously holding or skipping home medications which likely caused rebound malignant hypertension as above exacerbating kidney disease - Home regimen includes clonidine patch, Lasix 40 daily, hydralazine 100 every 6 hours - We have restarted patient's home amlodipine - Increase hydralazine to 50 mg every 8 hours 01/05/2020 - Transition from labetalol to carvedilol for more blood pressure control -increase carvedilol 01/04/2020 - Avoid clonidine given concern for rebound hypertension as well as avoid any nephrotoxic's given above  Chronic right lower extremity ulcer:  - Appears well-healing, continue wound care.  - Patient complaining of neuropathic pain in this area, will increase gabapentin to 3 times daily - Wound does not appear to be acute or inflamed, would not administer IV narcotics despite patient's specific request  Obstructive sleep apnea:  - Patient reportedly has been on CPAP - offered to use in hospital if patient agreeable  DVT prophylaxis: Heparin Code Status: Full code Family Communication: None present  Status is: Inpatient  Dispo: The patient is from: Home              Anticipated d/c is to: To be determined              Anticipated d/c date is: Likely 107  to 48 hours pending clinical course              Patient currently not medically stable for discharge given ongoing need for dialysis, safe disposition with dialysis slot.  Consultants:   Neprhology: Dr.  Daylene Katayama  Procedures:   Dialysis permacath placed  Fistula grafting 01/05/2020  Antimicrobials:  None indicated  Subjective: No acute issues or events overnight, patient feels quite well today, somewhat anxious for procedure but otherwise denies nausea, vomiting, diarrhea, constipation, headache, fevers, chills.  Objective: Vitals:   01/04/20 1630 01/04/20 1635 01/04/20 2359 01/05/20 0059  BP: (!) 172/114 (!) 168/109 (!) 170/102 (!) 160/90  Pulse: 83 84 82   Resp: 17 18 18    Temp:  98.2 F (36.8 C) 98.2 F (36.8 C)   TempSrc:  Oral Oral   SpO2:  95% 98%   Weight:  103.5 kg    Height:        Intake/Output Summary (Last 24 hours) at 01/05/2020 0745 Last data filed at 01/05/2020 0538 Gross per 24 hour  Intake --  Output 1200 ml  Net -1200 ml   Filed Weights   01/03/20 2331 01/04/20 1320 01/04/20 1635  Weight: 106.7 kg 104.5 kg 103.5 kg    Examination:  General:  Pleasantly resting in bed, No acute distress. HEENT:  Normocephalic atraumatic.  Sclerae nonicteric, noninjected.  Extraocular movements intact bilaterally. Neck:  Without mass or deformity.  Trachea is midline.  Right IJ dialysis catheter bandage clean dry intact. Lungs:  Clear to auscultate bilaterally without rhonchi, wheeze, or rales. Heart:  Regular rate and rhythm.  Without murmurs, rubs, or gallops. Abdomen:  Soft, nontender, nondistended.  Without guarding or rebound. Extremities: Without cyanosis, clubbing, edema, or obvious deformity. Vascular:  Dorsalis pedis and posterior tibial pulses palpable bilaterally. Skin:  Warm and dry, no erythema, chronic 2 x 5 cm anterior right tibial wound well-healed with granulation tissue, fat pad visible.   Data Reviewed: I have personally reviewed following labs and imaging studies  CBC: Recent Labs  Lab 12/30/19 1023 12/30/19 1023 12/30/19 1731 12/30/19 1923 12/31/19 0500 01/03/20 0212 01/04/20 1300  WBC 10.7*  --   --  12.5* 10.3 9.9 10.6*  HGB  9.4*   < > 9.9* 9.3* 9.3* 8.5* 8.8*  HCT 30.2*   < > 29.0* 29.4* 29.5* 26.9* 28.3*  MCV 90.7  --   --  92.5 91.9 90.6 92.2  PLT 440*  --   --  457* 470* 419* 432*   < > = values in this interval not displayed.   Basic Metabolic Panel: Recent Labs  Lab 12/30/19 1023 12/30/19 1023 12/30/19 1731 12/30/19 1923 12/31/19 0500 01/01/20 0120 01/03/20 0212 01/04/20 1300  NA 135   < > 137  --  136 135 136 137  K 4.1   < > 3.9  --  3.7 3.5 3.6 3.7  CL 103  --   --   --  103 103 102 102  CO2 16*  --   --   --  16* 18* 20* 22  GLUCOSE 184*  --   --   --  158* 164* 186* 131*  BUN 82*  --   --   --  85* 88* 69* 49*  CREATININE 10.43*   < >  --  10.49* 10.50* 10.73* 9.49* 7.41*  CALCIUM 9.1  --   --   --  9.0 8.7* 9.0 9.1  PHOS  --   --   --   --  7.0* 7.3*  --  5.3*   < > = values in this interval not displayed.   GFR: Estimated Creatinine Clearance: 13.2 mL/min (A) (by C-G formula based on SCr of 7.41 mg/dL (H)). Liver Function Tests: Recent Labs  Lab 12/31/19 0500 01/01/20 0120 01/04/20 1300  ALBUMIN 2.6* 2.4* 2.6*   No results for input(s): LIPASE, AMYLASE in the last 168 hours. No results for input(s): AMMONIA in the last 168 hours. Coagulation Profile: No results for input(s): INR, PROTIME in the last 168 hours. Cardiac Enzymes: No results for input(s): CKTOTAL, CKMB, CKMBINDEX, TROPONINI in the last 168 hours. BNP (last 3 results) No results for input(s): PROBNP in the last 8760 hours. HbA1C: No results for input(s): HGBA1C in the last 72 hours. CBG: Recent Labs  Lab 01/03/20 1620 01/03/20 2357 01/04/20 0725 01/04/20 1204 01/05/20 0001  GLUCAP 138* 149* 132* 143* 132*   Lipid Profile: No results for input(s): CHOL, HDL, LDLCALC, TRIG, CHOLHDL, LDLDIRECT in the last 72 hours. Thyroid Function Tests: No results for input(s): TSH, T4TOTAL, FREET4, T3FREE, THYROIDAB in the last 72 hours. Anemia Panel: No results for input(s): VITAMINB12, FOLATE, FERRITIN, TIBC, IRON,  RETICCTPCT in the last 72 hours. Sepsis Labs: Recent Labs  Lab 12/30/19 1721  LATICACIDVEN 0.7    Recent Results (from the past 240 hour(s))  SARS Coronavirus 2 by RT PCR (hospital order, performed in [name] hospital lab) Nasopharyngeal Nasopharyngeal Swab     Status: None   Collection Time: 12/30/19  6:37 PM   Specimen: Nasopharyngeal Swab  Result Value Ref Range Status   SARS Coronavirus 2 NEGATIVE NEGATIVE Final    Comment: (NOTE) SARS-CoV-2 target nucleic acids are NOT DETECTED.  The SARS-CoV-2 RNA is generally detectable in upper and lower respiratory specimens during the acute phase of infection. The lowest concentration of SARS-CoV-2 viral copies this assay can detect is 250 copies / mL. A negative result does not preclude SARS-CoV-2 infection and should not be used as the sole basis for treatment or other patient management decisions.  A negative result may occur with improper specimen collection / handling, submission of specimen other than nasopharyngeal swab, presence of viral mutation(s) within the areas targeted by this assay, and inadequate number of viral copies (<250 copies / mL). A negative result must be combined with clinical observations, patient history, and epidemiological information.  Fact Sheet for Patients:   StrictlyIdeas.no  Fact Sheet for Healthcare Providers: BankingDealers.co.za  This test is not yet approved or  cleared by the [place] FDA and has been authorized for detection and/or diagnosis of SARS-CoV-2 by FDA under an Emergency Use Authorization (EUA).  This EUA will remain in effect (meaning this test can be used) for the duration of the COVID-19 declaration under Section 564(b)(1) of the Act, 21 U.S.C. section 360bbb-3(b)(1), unless the authorization is terminated or revoked sooner.  Performed at [facility], [place] [address] [place], [place] [address]           Radiology Studies: VAS [place] UPPER EXT VEIN MAPPING (PRE-OP AVF)  Result Date: 01/03/2020 UPPER EXTREMITY VEIN MAPPING  Indications: Pre-dialysis access. History: CKD V.  Limitations: IV/tape left forearm Comparison Study: No prior study Performing Technologist: Sharion Dove RVS  Examination Guidelines: A complete evaluation includes B-mode imaging, spectral Doppler, color Doppler, and power Doppler as needed of all accessible portions of each vessel. Bilateral testing is considered an integral part of a complete examination. Limited examinations for reoccurring indications may be performed as noted. +-----------------+-------------+----------+---------+  Right Cephalic   Diameter (cm)Depth (cm)Findings  +-----------------+-------------+----------+---------+ Prox upper arm       0.39        0.55             +-----------------+-------------+----------+---------+ Mid upper arm        0.42        0.17             +-----------------+-------------+----------+---------+ Dist upper arm       0.37        0.16   branching +-----------------+-------------+----------+---------+ Antecubital fossa    0.65        0.27             +-----------------+-------------+----------+---------+ Prox forearm         0.40        0.57   branching +-----------------+-------------+----------+---------+ Mid forearm          0.35        0.41   branching +-----------------+-------------+----------+---------+ Dist forearm         0.33        0.44             +-----------------+-------------+----------+---------+ Wrist                0.25        0.46             +-----------------+-------------+----------+---------+ +-----------------+-------------+----------+-----------------------------------+ Left Cephalic    Diameter (cm)Depth (cm)             Findings               +-----------------+-------------+----------+-----------------------------------+ Prox upper arm       0.41         0.57                                       +-----------------+-------------+----------+-----------------------------------+ Mid upper arm        0.41        0.26                                       +-----------------+-------------+----------+-----------------------------------+ Dist upper arm       0.45        0.25                                       +-----------------+-------------+----------+-----------------------------------+ Antecubital fossa    0.47        0.37                                       +-----------------+-------------+----------+-----------------------------------+ Prox forearm         0.34        0.45   branching and between prox and mid                                               forearm (bandages in way)      +-----------------+-------------+----------+-----------------------------------+ Mid forearm          0.30  0.45                branching              +-----------------+-------------+----------+-----------------------------------+ Dist forearm         0.31        0.40                                       +-----------------+-------------+----------+-----------------------------------+ Wrist                0.23        0.47                branching              +-----------------+-------------+----------+-----------------------------------+ +-----------------+-------------+----------+---------+ Left Basilic     Diameter (cm)Depth (cm)Findings  +-----------------+-------------+----------+---------+ Prox upper arm       0.51        1.85    origin   +-----------------+-------------+----------+---------+ Mid upper arm        0.47        0.91             +-----------------+-------------+----------+---------+ Dist upper arm       0.40        0.49   branching +-----------------+-------------+----------+---------+ Antecubital fossa    0.39        0.18   branching +-----------------+-------------+----------+---------+  Prox forearm         0.35        0.24   branching +-----------------+-------------+----------+---------+ Mid forearm          0.27        0.16             +-----------------+-------------+----------+---------+ Distal forearm       0.20        0.24   branching +-----------------+-------------+----------+---------+ Wrist                0.16        0.27             +-----------------+-------------+----------+---------+ *See table(s) above for measurements and observations.  Diagnosing physician: [name] MD Electronically signed by [name] MD on 01/03/2020 at 2:01:46 PM.    Final     Scheduled Meds: . amLODipine  10 mg Oral Daily  . calcitRIOL  0.25 mcg Oral Q M,W,F  . carvedilol  25 mg Oral BID WC  . Chlorhexidine Gluconate Cloth  6 each Topical Q0600  . [START ON 01/07/2020] darbepoetin (ARANESP) injection - DIALYSIS  40 mcg Intravenous Q Thu-HD  . gabapentin  300 mg Oral Daily  . heparin  5,000 Units Subcutaneous Q8H  . hydrALAZINE  25 mg Oral Q8H  . insulin aspart  0-5 Units Subcutaneous QHS  . insulin aspart  0-6 Units Subcutaneous TID WC  . multivitamin  1 tablet Oral QHS  . sevelamer carbonate  800 mg Oral TID WC    LOS: 6 days   Time spent: 55min  Latha Staunton C Jacinta Penalver, DO [facility]  If 7PM-7AM, please contact night-coverage [email or web address]  01/05/2020, 7:45 AM

## 2020-01-05 NOTE — Progress Notes (Signed)
Initial Nutrition Assessment  DOCUMENTATION CODES:   Obesity unspecified  INTERVENTION:  Provided renal diet education  Continue MVI daily  Once pt's diet is advanced, begin: Nepro Shake po BID, each supplement provides 425 kcal and 19 grams protein   NUTRITION DIAGNOSIS:   Increased nutrient needs related to chronic illness (ESRD on HD) as evidenced by estimated needs.    GOAL:   Patient will meet greater than or equal to 90% of their needs    MONITOR:   PO intake, Supplement acceptance, Labs, Skin, Weight trends, I & O's  REASON FOR ASSESSMENT:   Consult Diet education  ASSESSMENT:   Pt is a new start HD admitted for acute renal failure superimposed on stage 5 CKD. PMH includes CKD stage IV, DM, HTN, R leg wound, OSA on CPAP, hypertensive encephalopathy and adjustment disorder.  Pt unavailable at time of RD visit. Dialysis permacath placed. Pt having fistula grafting today.   No PO intake documented.  Last HD 9/27, net UF 1081ml UOP: 290ml x 24 hours I/O: -4951ml since admit  Labs: phosphorus 5.3 (H), CBGs 132-126-126 Medications: rocaltrol, aranesp, novolog, renavit, renvela  NUTRITION - FOCUSED PHYSICAL EXAM:  Unable to perform at this time; will attempt at follow-up.  Diet Order:   Diet Order            Diet NPO time specified Except for: Sips with Meds  Diet effective midnight                 EDUCATION NEEDS:   Education needs have been addressed  Skin:  Skin Assessment: Skin Integrity Issues: Skin Integrity Issues:: Other (Comment) Other: non-pressure wound R leg  Last BM:  9/27  Height:   Ht Readings from Last 1 Encounters:  12/31/19 5\' 7"  (1.702 m)    Weight:   Wt Readings from Last 1 Encounters:  01/04/20 103.5 kg    Ideal Body Weight:  67.27 kg  BMI:  Body mass index is 35.74 kg/m.  Estimated Nutritional Needs:   Kcal:  2150-2350  Protein:  115-135 grams  Fluid:  1 L + UOP    Larkin Ina, MS, RD,  LDN RD pager number and weekend/on-call pager number located in [place].

## 2020-01-05 NOTE — Progress Notes (Signed)
Patient ID: Mliss Fritz Strehle, male   DOB: [birth year], 54 y.o.   MRN: [id number] [facility] Progress Note   Assessment/ Plan:   1. End stage renal disease: With a history of progressive chronic kidney disease stage V secondary to underlying hypertension and diabetes and now started on dialysis via right IJ [facility] for uremic symptoms.  En route to surgery for left BCF versus first stage BBF today.  Will order for hemodialysis again tomorrow.  Process initiated for outpatient dialysis unit placement. 2.  Hypertension: Blood pressure noted to be elevated at this time, monitor with ultrafiltration on hemodialysis.  On antihypertensive therapy with amlodipine, carvedilol and hydralazine. 3.  Anemia of chronic kidney disease: Without overt blood loss, continue ESA-we will adjust dose.  Iron stores replete-TSAT 31%, ferritin 175. 4.  Secondary hyperparathyroidism: On calcitriol for PTH suppression and sevelamer for phosphorus binding, will continue to follow phosphorus trends. 5.  Paroxysmal atrial fibrillation: Eliquis currently on hold to allow for dialysis access surgery today.  Subjective:   No acute events overnight, a little apprehensive about surgery today.   Objective:   BP (!) 160/90   Pulse 82   Temp 98.2 F (36.8 C) (Oral)   Resp 18   Ht 5\' 7"  (1.702 m)   Wt 103.5 kg   SpO2 98%   BMI 35.74 kg/m   Intake/Output Summary (Last 24 hours) at 01/05/2020 0753 Last data filed at 01/05/2020 0538 Gross per 24 hour  Intake --  Output 1200 ml  Net -1200 ml   Weight change: -2.2 kg  Physical Exam: Gen: Resting flat in bed, watching television CVS: Pulse regular rhythm, normal rate, S1 and S2 normal without any murmur or rub Resp: Clear to auscultation, no rales/rhonchi.  Right IJ TDC in place. Abd: Soft, obese, nontender, bowel sounds normal Ext: No lower extremity edema  Imaging: VAS [place] UPPER EXT VEIN MAPPING (PRE-OP AVF)  Result Date: 01/03/2020 UPPER EXTREMITY VEIN MAPPING   Indications: Pre-dialysis access. History: CKD V.  Limitations: IV/tape left forearm Comparison Study: No prior study Performing Technologist: Sharion Dove RVS  Examination Guidelines: A complete evaluation includes B-mode imaging, spectral Doppler, color Doppler, and power Doppler as needed of all accessible portions of each vessel. Bilateral testing is considered an integral part of a complete examination. Limited examinations for reoccurring indications may be performed as noted. +-----------------+-------------+----------+---------+ Right Cephalic   Diameter (cm)Depth (cm)Findings  +-----------------+-------------+----------+---------+ Prox upper arm       0.39        0.55             +-----------------+-------------+----------+---------+ Mid upper arm        0.42        0.17             +-----------------+-------------+----------+---------+ Dist upper arm       0.37        0.16   branching +-----------------+-------------+----------+---------+ Antecubital fossa    0.65        0.27             +-----------------+-------------+----------+---------+ Prox forearm         0.40        0.57   branching +-----------------+-------------+----------+---------+ Mid forearm          0.35        0.41   branching +-----------------+-------------+----------+---------+ Dist forearm         0.33        0.44             +-----------------+-------------+----------+---------+  Wrist                0.25        0.46             +-----------------+-------------+----------+---------+ +-----------------+-------------+----------+-----------------------------------+ Left Cephalic    Diameter (cm)Depth (cm)             Findings               +-----------------+-------------+----------+-----------------------------------+ Prox upper arm       0.41        0.57                                       +-----------------+-------------+----------+-----------------------------------+ Mid  upper arm        0.41        0.26                                       +-----------------+-------------+----------+-----------------------------------+ Dist upper arm       0.45        0.25                                       +-----------------+-------------+----------+-----------------------------------+ Antecubital fossa    0.47        0.37                                       +-----------------+-------------+----------+-----------------------------------+ Prox forearm         0.34        0.45   branching and between prox and mid                                               forearm (bandages in way)      +-----------------+-------------+----------+-----------------------------------+ Mid forearm          0.30        0.45                branching              +-----------------+-------------+----------+-----------------------------------+ Dist forearm         0.31        0.40                                       +-----------------+-------------+----------+-----------------------------------+ Wrist                0.23        0.47                branching              +-----------------+-------------+----------+-----------------------------------+ +-----------------+-------------+----------+---------+ Left Basilic     Diameter (cm)Depth (cm)Findings  +-----------------+-------------+----------+---------+ Prox upper arm       0.51        1.85    origin   +-----------------+-------------+----------+---------+ Mid upper arm        0.47  0.91             +-----------------+-------------+----------+---------+ Dist upper arm       0.40        0.49   branching +-----------------+-------------+----------+---------+ Antecubital fossa    0.39        0.18   branching +-----------------+-------------+----------+---------+ Prox forearm         0.35        0.24   branching +-----------------+-------------+----------+---------+ Mid forearm           0.27        0.16             +-----------------+-------------+----------+---------+ Distal forearm       0.20        0.24   branching +-----------------+-------------+----------+---------+ Wrist                0.16        0.27             +-----------------+-------------+----------+---------+ *See table(s) above for measurements and observations.  Diagnosing physician: [name] MD Electronically signed by [name] MD on 01/03/2020 at 2:01:46 PM.    Final     Labs: BMET Recent Labs  Lab 12/30/19 1023 12/30/19 1731 12/30/19 1923 12/31/19 0500 01/01/20 0120 01/03/20 0212 01/04/20 1300  NA 135 137  --  136 135 136 137  K 4.1 3.9  --  3.7 3.5 3.6 3.7  CL 103  --   --  103 103 102 102  CO2 16*  --   --  16* 18* 20* 22  GLUCOSE 184*  --   --  158* 164* 186* 131*  BUN 82*  --   --  85* 88* 69* 49*  CREATININE 10.43*  --  10.49* 10.50* 10.73* 9.49* 7.41*  CALCIUM 9.1  --   --  9.0 8.7* 9.0 9.1  PHOS  --   --   --  7.0* 7.3*  --  5.3*   CBC Recent Labs  Lab 12/30/19 1923 12/31/19 0500 01/03/20 0212 01/04/20 1300  WBC 12.5* 10.3 9.9 10.6*  HGB 9.3* 9.3* 8.5* 8.8*  HCT 29.4* 29.5* 26.9* 28.3*  MCV 92.5 91.9 90.6 92.2  PLT 457* 470* 419* 432*    Medications:    . amLODipine  10 mg Oral Daily  . calcitRIOL  0.25 mcg Oral Q M,W,F  . carvedilol  25 mg Oral BID WC  . Chlorhexidine Gluconate Cloth  6 each Topical Q0600  . [START ON 01/07/2020] darbepoetin (ARANESP) injection - DIALYSIS  40 mcg Intravenous Q Thu-HD  . gabapentin  300 mg Oral Daily  . heparin  5,000 Units Subcutaneous Q8H  . hydrALAZINE  50 mg Oral Q8H  . insulin aspart  0-5 Units Subcutaneous QHS  . insulin aspart  0-6 Units Subcutaneous TID WC  . multivitamin  1 tablet Oral QHS  . sevelamer carbonate  800 mg Oral TID WC   Elmarie Shiley, MD 01/05/2020, 7:53 AM

## 2020-01-05 NOTE — Transfer of Care (Signed)
Immediate Anesthesia Transfer of Care Note  Patient: [name]  Procedure(s) Performed: LEFT ARM BRACHIOCEPHALIC ARTERIOVENOUS (AV) FISTULA (Left Arm Lower)  Patient Location: PACU  Anesthesia Type:MAC  Level of Consciousness: drowsy and patient cooperative  Airway & Oxygen Therapy: Patient Spontanous Breathing and Patient connected to face mask oxygen  Post-op Assessment: Report given to RN and Post -op Vital signs reviewed and stable  Post vital signs: Reviewed and stable  Last Vitals:  Vitals Value Taken Time  BP 144/73 01/05/20 1117  Temp    Pulse 77 01/05/20 1118  Resp 18 01/05/20 1118  SpO2 100 % 01/05/20 1118  Vitals shown include unvalidated device data.  Last Pain:  Vitals:   01/05/20 0855  TempSrc:   PainSc: 9       Patients Stated Pain Goal: 2 (31/74/09 9278)  Complications: No complications documented.

## 2020-01-05 NOTE — Op Note (Signed)
    NAMESydney Azure [name]    MRN: [id number] DOB: 05/01/1965    DATE OF OPERATION: 01/05/2020  PREOP DIAGNOSIS:    Stage V chronic kidney disease  POSTOP DIAGNOSIS:    Same  PROCEDURE:    Left brachiocephalic AV fistula  SURGEON: Judeth Cornfield. Scot Dock, MD  ASSIST: Olin Pia, RNFA  ANESTHESIA: Local with sedation  EBL: Minimal  INDICATIONS:    [name] is a 54 y.o. male who presents for new access.  He has a functioning catheter.  FINDINGS:   4 mm upper arm cephalic vein  TECHNIQUE:   The patient was taken to the operating room and sedated by anesthesia.  The left upper extremity was prepped and draped in usual sterile fashion.  After the skin was infiltrated puncture lidocaine a longitudinal incision was made just above the antecubital level.  Here the cephalic vein was dissected free.  Branches were divided between clips and 3-0 silk ties.  The brachial artery was dissected free beneath the fascia.  The patient was heparinized.  The brachial artery was clamped proximally and distally and a longitudinal arteriotomy was made.  The vein was then sewn end-to-side to the artery using continuous 6-0 Prolene suture.  At the completion there was a good thrill in the fistula and a good radial and ulnar signal with a Doppler.  The heparin was partially reversed with protamine.  The wound was then closed with a deep layer of 3-0 Vicryl and the skin closed with 4-0 Vicryl.  Dermabond was applied.  The patient tolerated the procedure well and was transferred to the recovery room in stable condition.  All needle and sponge counts were correct.  Given the complexity of the case a first assistant was necessary in order to expedient the procedure and safely perform the technical aspects of the operation.  [name], MD, FACS [facility]  DATE OF DICTATION:   01/05/2020

## 2020-01-06 ENCOUNTER — Encounter (HOSPITAL_COMMUNITY): Payer: Self-pay | Admitting: Vascular Surgery

## 2020-01-06 DIAGNOSIS — Z794 Long term (current) use of insulin: Secondary | ICD-10-CM

## 2020-01-06 LAB — RENAL FUNCTION PANEL
Albumin: 2.6 g/dL — ABNORMAL LOW (ref 3.5–5.0)
Albumin: 2.9 g/dL — ABNORMAL LOW (ref 3.5–5.0)
Anion gap: 16 — ABNORMAL HIGH (ref 5–15)
Anion gap: 12 (ref 5–15)
BUN: 43 mg/dL — ABNORMAL HIGH (ref 6–20)
BUN: 15 mg/dL (ref 6–20)
CO2: 22 mmol/L (ref 22–32)
CO2: 25 mmol/L (ref 22–32)
Calcium: 9.4 mg/dL (ref 8.9–10.3)
Calcium: 9.2 mg/dL (ref 8.9–10.3)
Chloride: 98 mmol/L (ref 98–111)
Chloride: 96 mmol/L — ABNORMAL LOW (ref 98–111)
Creatinine, Ser: 7.39 mg/dL — ABNORMAL HIGH (ref 0.61–1.24)
Creatinine, Ser: 3.79 mg/dL — ABNORMAL HIGH (ref 0.61–1.24)
GFR calc Af Amer: 9 mL/min — ABNORMAL LOW (ref >60)
GFR calc Af Amer: 20 mL/min — ABNORMAL LOW (ref >60)
GFR calc non Af Amer: 8 mL/min — ABNORMAL LOW (ref >60)
GFR calc non Af Amer: 17 mL/min — ABNORMAL LOW (ref >60)
Glucose, Bld: 96 mg/dL (ref 70–99)
Glucose, Bld: 152 mg/dL — ABNORMAL HIGH (ref 70–99)
Phosphorus: 5.6 mg/dL — ABNORMAL HIGH (ref 2.5–4.6)
Phosphorus: 2.7 mg/dL (ref 2.5–4.6)
Potassium: 3.7 mmol/L (ref 3.5–5.1)
Potassium: 3.3 mmol/L — ABNORMAL LOW (ref 3.5–5.1)
Sodium: 136 mmol/L (ref 135–145)
Sodium: 133 mmol/L — ABNORMAL LOW (ref 135–145)

## 2020-01-06 LAB — CBC
HCT: 28.9 % — ABNORMAL LOW (ref 39.0–52.0)
HCT: 32.2 % — ABNORMAL LOW (ref 39.0–52.0)
Hemoglobin: 8.8 g/dL — ABNORMAL LOW (ref 13.0–17.0)
Hemoglobin: 10.3 g/dL — ABNORMAL LOW (ref 13.0–17.0)
MCH: 28.2 pg (ref 26.0–34.0)
MCH: 28.7 pg (ref 26.0–34.0)
MCHC: 30.4 g/dL (ref 30.0–36.0)
MCHC: 32 g/dL (ref 30.0–36.0)
MCV: 92.6 fL (ref 80.0–100.0)
MCV: 89.7 fL (ref 80.0–100.0)
Platelets: 446 10*3/uL — ABNORMAL HIGH (ref 150–400)
Platelets: 452 10*3/uL — ABNORMAL HIGH (ref 150–400)
RBC: 3.12 MIL/uL — ABNORMAL LOW (ref 4.22–5.81)
RBC: 3.59 MIL/uL — ABNORMAL LOW (ref 4.22–5.81)
RDW: 13.8 % (ref 11.5–15.5)
RDW: 13.6 % (ref 11.5–15.5)
WBC: 10.2 10*3/uL (ref 4.0–10.5)
WBC: 9.5 10*3/uL (ref 4.0–10.5)
nRBC: 0 % (ref 0.0–0.2)
nRBC: 0 % (ref 0.0–0.2)

## 2020-01-06 LAB — GLUCOSE, CAPILLARY
Glucose-Capillary: 88 mg/dL (ref 70–99)
Glucose-Capillary: 94 mg/dL (ref 70–99)
Glucose-Capillary: 92 mg/dL (ref 70–99)
Glucose-Capillary: 147 mg/dL — ABNORMAL HIGH (ref 70–99)
Glucose-Capillary: 74 mg/dL (ref 70–99)
Glucose-Capillary: 93 mg/dL (ref 70–99)

## 2020-01-06 MED ORDER — GLUCOSE BLOOD VI STRP: ORAL_STRIP | 0 refills | Status: DC

## 2020-01-06 MED ORDER — MUPIROCIN 2 % EX OINT
1.0000 "application " | TOPICAL_OINTMENT | Freq: Two times a day (BID) | CUTANEOUS | Status: DC
  Administered 2020-01-06 (×2): 1 via NASAL
  Filled 2020-01-06 (×2): qty 22

## 2020-01-06 MED ORDER — ACCU-CHEK SOFTCLIX LANCETS MISC: 0 refills | Status: DC

## 2020-01-06 MED ORDER — HYDRALAZINE HCL 50 MG PO TABS
50.0000 mg | ORAL_TABLET | Freq: Three times a day (TID) | ORAL | 0 refills | Status: DC
Start: 2020-01-06 — End: 2020-02-02

## 2020-01-06 MED ORDER — CALCITRIOL 0.25 MCG PO CAPS: 0.2500 ug | ORAL_CAPSULE | ORAL | 0 refills | Status: DC

## 2020-01-06 MED ORDER — AMLODIPINE BESYLATE 10 MG PO TABS
10.0000 mg | ORAL_TABLET | Freq: Every day | ORAL | 0 refills | Status: DC
Start: 2020-01-06 — End: 2022-05-24

## 2020-01-06 MED ORDER — RENA-VITE PO TABS: 1.0000 | ORAL_TABLET | Freq: Every day | ORAL | 0 refills | Status: DC

## 2020-01-06 MED ORDER — SEVELAMER CARBONATE 800 MG PO TABS
800.0000 mg | ORAL_TABLET | Freq: Three times a day (TID) | ORAL | 0 refills | Status: DC
Start: 2020-01-06 — End: 2022-03-22

## 2020-01-06 MED ORDER — CALCITRIOL 0.25 MCG PO CAPS
ORAL_CAPSULE | ORAL | Status: AC
  Administered 2020-01-06: 0.25 ug
  Filled 2020-01-06: qty 1

## 2020-01-06 MED ORDER — HEPARIN SODIUM (PORCINE) 1000 UNIT/ML IJ SOLN
INTRAMUSCULAR | Status: AC
  Filled 2020-01-06: qty 4

## 2020-01-06 MED ORDER — CARVEDILOL 25 MG PO TABS: 25.0000 mg | ORAL_TABLET | Freq: Two times a day (BID) | ORAL | 0 refills | Status: DC

## 2020-01-06 NOTE — Plan of Care (Signed)
  Problem: Clinical Measurements: Goal: Will remain free from infection Outcome: Progressing   Problem: Clinical Measurements: Goal: Respiratory complications will improve Outcome: Progressing   Problem: Nutrition: Goal: Adequate nutrition will be maintained Outcome: Progressing

## 2020-01-06 NOTE — Progress Notes (Signed)
Patient ID: [name], male   DOB: 06-22-1965, 54 y.o.   MRN: [id number] [facility] Progress Note   Assessment/ Plan:   1. End stage renal disease: With a history of progressive chronic kidney disease stage V secondary to underlying hypertension and diabetes and now started on dialysis via right IJ [facility] for uremic symptoms.  The process has been initiated for outpatient dialysis unit placement and he is now status post left brachiocephalic fistula creation by Dr. Scot Dock on 9/28.  I will discontinue furosemide/potassium supplement. 2.  Hypertension: Blood pressure noted to be elevated at this time, monitor with ultrafiltration on hemodialysis.  On antihypertensive therapy with amlodipine, carvedilol and hydralazine. 3.  Anemia of chronic kidney disease: Without overt blood loss, continue ESA-we will adjust dose.  Iron stores replete-TSAT 31%, ferritin 175. 4.  Secondary hyperparathyroidism: On calcitriol for PTH suppression and sevelamer for phosphorus binding, will continue to follow phosphorus trends. 5.  Paroxysmal atrial fibrillation: Eliquis restarted again today for CVA prophylaxis.  Subjective:   Without acute events overnight, some minimal left upper arm discomfort as expected postoperatively stop   Objective:   BP (!) 151/89 (BP Location: Right Arm)   Pulse 81   Temp 98.8 F (37.1 C)   Resp 17   Ht 5\' 7"  (1.702 m)   Wt 98.4 kg   SpO2 98%   BMI 33.98 kg/m   Intake/Output Summary (Last 24 hours) at 01/06/2020 0754 Last data filed at 01/06/2020 0314 Gross per 24 hour  Intake 400 ml  Output 710 ml  Net -310 ml   Weight change: -6.1 kg  Physical Exam: Gen: Resting comfortably in bed, working on phone CVS: Pulse regular rhythm, normal rate, S1 and S2 normal without any murmur or rub Resp: Clear to auscultation, no rales/rhonchi.  Right IJ TDC in place. Abd: Soft, obese, nontender, bowel sounds normal Ext: No lower extremity edema.  Palpable thrill left upper  arm BCF  Imaging: No results found.  Labs: BMET Recent Labs  Lab 12/30/19 1023 12/30/19 1731 12/30/19 1923 12/31/19 0500 01/01/20 0120 01/03/20 0212 01/04/20 1300  NA 135 137  --  136 135 136 137  K 4.1 3.9  --  3.7 3.5 3.6 3.7  CL 103  --   --  103 103 102 102  CO2 16*  --   --  16* 18* 20* 22  GLUCOSE 184*  --   --  158* 164* 186* 131*  BUN 82*  --   --  85* 88* 69* 49*  CREATININE 10.43*  --  10.49* 10.50* 10.73* 9.49* 7.41*  CALCIUM 9.1  --   --  9.0 8.7* 9.0 9.1  PHOS  --   --   --  7.0* 7.3*  --  5.3*   CBC Recent Labs  Lab 12/30/19 1923 12/31/19 0500 01/03/20 0212 01/04/20 1300  WBC 12.5* 10.3 9.9 10.6*  HGB 9.3* 9.3* 8.5* 8.8*  HCT 29.4* 29.5* 26.9* 28.3*  MCV 92.5 91.9 90.6 92.2  PLT 457* 470* 419* 432*    Medications:    . amLODipine  10 mg Oral Daily  . apixaban  5 mg Oral BID  . calcitRIOL  0.25 mcg Oral Q M,W,F  . carvedilol  25 mg Oral BID WC  . Chlorhexidine Gluconate Cloth  6 each Topical Q0600  . [START ON 01/09/2020] cloNIDine  0.2 mg Transdermal Q Sat  . [START ON 01/07/2020] darbepoetin (ARANESP) injection - DIALYSIS  40 mcg Intravenous Q Thu-HD  .  feeding supplement (NEPRO CARB STEADY)  237 mL Oral BID BM  . fluticasone  1 spray Each Nare BID  . furosemide  120 mg Oral Daily  . gabapentin  300 mg Oral Daily  . hydrALAZINE  100 mg Oral QID  . hydrALAZINE  50 mg Oral Q8H  . insulin aspart  0-5 Units Subcutaneous QHS  . insulin aspart  0-6 Units Subcutaneous TID WC  . insulin aspart protamine- aspart  25 Units Subcutaneous BID WC  . loratadine  10 mg Oral QHS  . multivitamin  1 tablet Oral QHS  . mupirocin ointment  1 application Nasal BID  . potassium chloride SA  20 mEq Oral q morning - 10a  . sevelamer carbonate  800 mg Oral TID WC  . [START ON 01/10/2020] Vitamin D (Ergocalciferol)  50,000 Units Oral Q Glennie Hawk, MD 01/06/2020, 7:54 AM

## 2020-01-06 NOTE — Progress Notes (Signed)
PROGRESS NOTE    [name]  HFW:[id number] DOB: 1965-06-03 DOA: 12/30/2019 PCP: Bartholome Bill, MD    Chief Complaint  Patient presents with  . Chest Pain    Brief Narrative:  [name] a 54 y.o.malewith medical history significant ofchronic kidney disease stage IV, diabetes, hypertension, right leg wound, obstructive sleep apnea on CPAP, hypertensive encephalopathy and adjustment disorder who is being followed by nephrology for advanced renal disease not yet requiring hemodialysis. Patient was sent from wound care center where he went for his routine appointment today.While there he was noted to have worsening renal function.Patient arrived the ER where his BUN/creatinine were noted to be higher than his baseline. His renal function showed BUN of 82 and creatinine 10.43. Previous creatinine was 8.23. Potassium however is normal at 4.1. Admitted to the hospital for evaluation by nephrology.  Subjective:  Postop day 1 status post left AV fistula placement He is scheduled to have hemodialysis today   Assessment & Plan:   Principal Problem:   Acute renal failure superimposed on stage 5 chronic kidney disease, not on chronic dialysis (HCC) Active Problems:   CKD (chronic kidney disease), stage V (HCC)   Essential hypertension, malignant   Diabetes mellitus type 2 in nonobese (HCC)   Acute diastolic congestive heart failure (HCC)   Chronic ulcer of right leg (HCC)   AF (paroxysmal atrial fibrillation) (HCC)   ESRD (end stage renal disease) ([place])     Acute on chronic kidney disease stage V - progressing now ESRD, POA: -  patient now progressing to ESRD dialysis per their schedule - Status post RIJ dialysis catheter on 9/24, Tolerated first dialysis on 01/01/2020 without any complication or issue - s/p AV fistula on 9/28 by vascular surgery Dr. Scot Dock -Outpatient dialysis arrangement scheduled -Plan per nephrology, nephrology input  appreciated  Reported paroxysmal A. fib, sinus rhythm since admission: - Unclear afib burden; reports being started on anticoagulation earlier this year by PCP - Currently normal sinus rhythm; previous attending discussed with patient at bedside "no indication for anticoagulation given questionable transient or provoked A. Fib", will defer to PCP - anticoagulation held briefly for vascular surgery  01/05/20, resumed on September 29 -Follow-up with PCP  Insulin-dependent diabetes type 2, uncontrolled, hyperglycemia, POA  - Continue sliding scale insulin, hypoglycemic protocol - Home insulin appears to be 70/30 25 units twice daily Recent Labs       Lab Results  Component Value Date   HGBA1C 7.5 (H) 12/30/2019     Malignant hypertension, improving: - Patient reports outpatient hypotension previously holding or skipping home medications which likely caused rebound malignant hypertension as above exacerbating kidney disease -Currently on Coreg, Norvasc, hydralazine, clonidine (he report has been on clonidine patch for the last 3 years) -He developed orthostatic hypotension with feeling dizzy this afternoon after returning from dialysis -Might need to back down blood pressure medication prior to discharge  Chronic right lower extremity ulcer: - Appears well-healing, continue wound care.  - Patient complaining of neuropathic pain in this area, will increase gabapentin to 3 times daily - Wound does not appear to be acute or inflamed, would not administer IV narcotics despite patient's specific request  Obstructive sleep apnea: - Patient reportedly has been on CPAP -reports he was taking off CPAP after 50 pounds of weight loss  Failure to thrive: PT evaluation recommended home health PT     DVT prophylaxis:  apixaban (ELIQUIS) tablet 5 mg   Code Status: Full Family Communication: Wife at bedside  Disposition:   Status is: Inpatient    Dispo: The patient is from: Home               Anticipated d/c is to: Home with home health              Anticipated d/c date is: September 30 if blood pressure improves, and home health set up                Consultants:   Nephrology  Vascular surgery  IR  Procedures:   Hemodialysis     Objective: Vitals:   01/05/20 1950 01/05/20 2348 01/06/20 0131 01/06/20 0717  BP: 113/78 131/81  (!) 151/89  Pulse: 79   81  Resp: 20   17  Temp: 98 F (36.7 C)   98.8 F (37.1 C)  TempSrc: Oral     SpO2: 97%   98%  Weight:  98.4 kg 98.4 kg   Height:        Intake/Output Summary (Last 24 hours) at 01/06/2020 0747 Last data filed at 01/06/2020 0314 Gross per 24 hour  Intake 400 ml  Output 710 ml  Net -310 ml   Filed Weights   01/04/20 1635 01/05/20 2348 01/06/20 0131  Weight: 103.5 kg 98.4 kg 98.4 kg    Examination:  General exam: calm, NAD Respiratory system: Clear to auscultation. Respiratory effort normal. Cardiovascular system: S1 & S2 heard, RRR. No JVD, no murmur, No pedal edema. Gastrointestinal system: Abdomen is nondistended, soft and nontender. No organomegaly or masses felt. Normal bowel sounds heard. Central nervous system: Alert and oriented. No focal neurological deficits. Extremities: Symmetric 5 x 5 power. Skin: Chronic healing anterior shin ulcer Psychiatry: Judgement and insight appear normal. Mood & affect appropriate.     Data Reviewed: I have personally reviewed following labs and imaging studies  CBC: Recent Labs  Lab 12/30/19 1023 12/30/19 1023 12/30/19 1731 12/30/19 1923 12/31/19 0500 01/03/20 0212 01/04/20 1300  WBC 10.7*  --   --  12.5* 10.3 9.9 10.6*  HGB 9.4*   < > 9.9* 9.3* 9.3* 8.5* 8.8*  HCT 30.2*   < > 29.0* 29.4* 29.5* 26.9* 28.3*  MCV 90.7  --   --  92.5 91.9 90.6 92.2  PLT 440*  --   --  457* 470* 419* 432*   < > = values in this interval not displayed.    Basic Metabolic Panel: Recent Labs  Lab 12/30/19 1023 12/30/19 1023 12/30/19 1731 12/30/19 1923  12/31/19 0500 01/01/20 0120 01/03/20 0212 01/04/20 1300  NA 135   < > 137  --  136 135 136 137  K 4.1   < > 3.9  --  3.7 3.5 3.6 3.7  CL 103  --   --   --  103 103 102 102  CO2 16*  --   --   --  16* 18* 20* 22  GLUCOSE 184*  --   --   --  158* 164* 186* 131*  BUN 82*  --   --   --  85* 88* 69* 49*  CREATININE 10.43*   < >  --  10.49* 10.50* 10.73* 9.49* 7.41*  CALCIUM 9.1  --   --   --  9.0 8.7* 9.0 9.1  PHOS  --   --   --   --  7.0* 7.3*  --  5.3*   < > = values in this interval not displayed.    GFR: Estimated Creatinine Clearance: 12.9  mL/min (A) (by C-G formula based on SCr of 7.41 mg/dL (H)).  Liver Function Tests: Recent Labs  Lab 12/31/19 0500 01/01/20 0120 01/04/20 1300  ALBUMIN 2.6* 2.4* 2.6*    CBG: Recent Labs  Lab 01/05/20 1238 01/05/20 1713 01/05/20 1954 01/06/20 0630 01/06/20 0718  GLUCAP 141* 123* 121* 94 88     Recent Results (from the past 240 hour(s))  SARS Coronavirus 2 by RT PCR (hospital order, performed in [facility] hospital lab) Nasopharyngeal Nasopharyngeal Swab     Status: None   Collection Time: 12/30/19  6:37 PM   Specimen: Nasopharyngeal Swab  Result Value Ref Range Status   SARS Coronavirus 2 NEGATIVE NEGATIVE Final    Comment: (NOTE) SARS-CoV-2 target nucleic acids are NOT DETECTED.  The SARS-CoV-2 RNA is generally detectable in upper and lower respiratory specimens during the acute phase of infection. The lowest concentration of SARS-CoV-2 viral copies this assay can detect is 250 copies / mL. A negative result does not preclude SARS-CoV-2 infection and should not be used as the sole basis for treatment or other patient management decisions.  A negative result may occur with improper specimen collection / handling, submission of specimen other than nasopharyngeal swab, presence of viral mutation(s) within the areas targeted by this assay, and inadequate number of viral copies (<250 copies / mL). A negative result must be  combined with clinical observations, patient history, and epidemiological information.  Fact Sheet for Patients:   StrictlyIdeas.no  Fact Sheet for Healthcare Providers: BankingDealers.co.za  This test is not yet approved or  cleared by the [place] FDA and has been authorized for detection and/or diagnosis of SARS-CoV-2 by FDA under an Emergency Use Authorization (EUA).  This EUA will remain in effect (meaning this test can be used) for the duration of the COVID-19 declaration under Section 564(b)(1) of the Act, 21 U.S.C. section 360bbb-3(b)(1), unless the authorization is terminated or revoked sooner.  Performed at [facility], [place] [address], [place], [place] [address]   MRSA PCR Screening     Status: Abnormal   Collection Time: 01/05/20  8:13 AM   Specimen: Nasal Mucosa; Nasopharyngeal  Result Value Ref Range Status   MRSA by PCR POSITIVE (A) NEGATIVE Final    Comment:        The GeneXpert MRSA Assay (FDA approved for NASAL specimens only), is one component of a comprehensive MRSA colonization surveillance program. It is not intended to diagnose MRSA infection nor to guide or monitor treatment for MRSA infections. RESULT CALLED TO, READ BACK BY AND VERIFIED WITH: Foye Clock RN 10:45 01/05/20 ([email or web address]) Performed at [facility], [place] [address] [place], [place] [address]          Radiology Studies: No results found.      Scheduled Meds: . amLODipine  10 mg Oral Daily  . apixaban  5 mg Oral BID  . calcitRIOL  0.25 mcg Oral Q M,W,F  . carvedilol  25 mg Oral BID WC  . Chlorhexidine Gluconate Cloth  6 each Topical Q0600  . [START ON 01/09/2020] cloNIDine  0.2 mg Transdermal Q Sat  . [START ON 01/07/2020] darbepoetin (ARANESP) injection - DIALYSIS  40 mcg Intravenous Q Thu-HD  . feeding supplement (NEPRO CARB STEADY)  237 mL Oral BID BM  . fluticasone  1 spray Each Nare BID  . furosemide  120 mg Oral  Daily  . gabapentin  300 mg Oral Daily  . hydrALAZINE  100 mg Oral QID  .  hydrALAZINE  50 mg Oral Q8H  . insulin aspart  0-5 Units Subcutaneous QHS  . insulin aspart  0-6 Units Subcutaneous TID WC  . insulin aspart protamine- aspart  25 Units Subcutaneous BID WC  . loratadine  10 mg Oral QHS  . multivitamin  1 tablet Oral QHS  . mupirocin ointment  1 application Nasal BID  . potassium chloride SA  20 mEq Oral q morning - 10a  . sevelamer carbonate  800 mg Oral TID WC  . [START ON 01/10/2020] Vitamin D (Ergocalciferol)  50,000 Units Oral Q Sun   Continuous Infusions:   LOS: 7 days   Time spent: 35 mins Greater than 50% of this time was spent in counseling, explanation of diagnosis, planning of further management, and coordination of care.  I have personally reviewed and interpreted on  01/06/2020 daily labs, tele strips, imagings as discussed above under date review session and assessment and plans.  I reviewed all nursing notes, pharmacy notes, consultant notes,  vitals, pertinent old records  I have discussed plan of care as described above with RN , patient and family on 01/06/2020  Voice Recognition /Dragon dictation system was used to create this note, attempts have been made to correct errors. Please contact the author with questions and/or clarifications.   Florencia Reasons, MD PhD FACP [facility]  Available via Epic secure chat 7am-7pm for nonurgent issues Please page for urgent issues To page the attending provider between 7A-7P or the covering provider during after hours 7P-7A, please log into the web site [email or web address] and access using universal [place] password for that web site. If you do not have the password, please call the hospital operator.    01/06/2020, 7:47 AM

## 2020-01-06 NOTE — Discharge Summary (Addendum)
Discharge Summary  [name] PYP:[id number] DOB: 1965/09/17  PCP: [name] Bill, MD  Admit date: 12/30/2019 Discharge date: 01/07/2020  Time spent: 80mins, more than 50% time spent on coordination of care.   Recommendations for Outpatient Follow-up:  1. F/u with PCP within a week  for hospital discharge follow up, repeat cbc/bmp at follow up 2. F/u with nephrology, continue outpatient dialysis Monday Wednesday Friday 3. Follow-up with vascular surgery 4. Follow-up with cardiology 5. Follow-up with wound care center for chronic right lower extremity wound  Discharge Diagnoses:  Active Hospital Problems   Diagnosis Date Noted  . Acute renal failure superimposed on stage 5 chronic kidney disease, not on chronic dialysis ([place]) 07/28/2017  . ESRD (end stage renal disease) ([place]) 01/02/2020  . AF (paroxysmal atrial fibrillation) ([place]) 10/25/2019  . Chronic ulcer of right leg ([place]) 10/25/2019  . Acute diastolic congestive heart failure ([place])   . Diabetes mellitus type 2 in nonobese (HCC)   . Essential hypertension, malignant 06/27/2013  . CKD (chronic kidney disease), stage V ([place]) 03/28/2011    Resolved Hospital Problems  No resolved problems to display.    Discharge Condition: stable  Diet recommendation: Renal/carb modified  Filed Weights   01/06/20 1245 01/06/20 2212 01/07/20 0107  Weight: 101.4 kg 103.7 kg 103.7 kg    History of present illness: (Per admitting MD Dr. Jonelle Sidle)  PCP: [name] Bill, MD   Outpatient Specialists: [facility]  Patient coming from: Home  Chief Complaint: Chest pain or shortness of breath  HPI: [name] is a 54 y.o. male with medical history significant of chronic kidney disease stage III-IV, diabetes, hypertension, right leg wound, obstructive sleep apnea on CPAP, hypertensive encephalopathy and adjustment disorder who is being followed by nephrology for advanced renal disease not yet requiring  hemodialysis.  Patient was sent from wound care center where he went for his routine appointment today.  He has a right leg wound that has been dealing with for the past several months.  Patient said the wound is having a lot of pain to the extent that he is unable to move around.  It usually start on the right leg goes all the way up to his chest wall as well as headache on the same side.  While there he was noted to have worsening renal function.  He mentioned headache.  Patient was noted to be slightly confused and systolic blood pressure was about 117.  Normally it is much higher.  No exposure to COVID-19 and has been vaccinated fully.  Patient arrived the ER where his BUN/creatinine were noted to be higher than his baseline.  Currently creatinine is above 10.  Nephrology consulted and recommend admission.  No immediate need for hemodialysis..  ED Course: Temperature is 97.7, blood pressure 185/103 pulse 95 respiratory 22 oxygen sat 100% on room air.  Venous pH 7.385.  Troponin is 43 lactic acid 0.7 white count 12.5 hemoglobin 9.3 and platelet 457.  COVID-19 screen is negative.  Urinalysis largely within normal.  His hemoglobin A1c is 7.5.  His renal function showed BUN of 82 and creatinine 10.43.  Previous creatinine was 8.23.  Potassium however is normal at 4.1.  Admitted to the hospital for evaluation by nephrology  Hospital Course:  Principal Problem:   Acute renal failure superimposed on stage 5 chronic kidney disease, not on chronic dialysis [facility]) Active Problems:   CKD (chronic kidney disease), stage V ([place])   Essential hypertension, malignant   Diabetes mellitus type  2 in nonobese [facility])   Acute diastolic congestive heart failure (HCC)   Chronic ulcer of right leg (HCC)   AF (paroxysmal atrial fibrillation) (HCC)   ESRD (end stage renal disease) ([place])   Acute on chronic kidney disease stage V - progressing now ESRD, POA, anemia of chronic disease, secondary hyper parathyroidism: -   patient now progressing to ESRD dialysis per their schedule - Status post RIJ dialysis catheter on 9/24, Tolerated first dialysis on 01/01/2020 without any complication or issue - s/p AV fistula on 9/28 by vascular surgery Dr. Scot Dock -Outpatient dialysis accepted at [facility] on a MWF schedule with a seat time of 12:15pm. He should arrive to his appointments at 11:55am. -He started on calcitriol for PTH suppression and Renvela for phos ending -Is cleared to discharge per nephrology, he is to follow-up with nephrology  H/o paroxysmal A. fib, sinus rhythm since admission: - Unclear afib burden; CHADS2 score is 2, with h/o hypertension diabetes ,last seen by [facility]ology at [facility]  in August 2020 - Currently normal sinus rhythm;  - anticoagulation held briefly for vascular surgery  01/05/20, resumed on September 29 -Follow-up with cardiology   Acute on chronic diastolic CHF -Volume being managed by dialysis now, home medication Lasix discontinued -Euvolemic at discharge  Insulin-dependent diabetes type 2, uncontrolled, hyperglycemia, POA  - Continue sliding scale insulin, hypoglycemic protocol - Home insulin appears to be 70/30 25 units twice daily Recent Labs       Lab Results  Component Value Date   HGBA1C 7.5 (H) 12/30/2019    -He is requesting refills on lancet and testing strips  Malignant hypertension, improving: - Patient reports outpatient hypotensionpreviously holding or skipping home medications which likely caused rebound malignant hypertension as above exacerbating kidney disease -he did not received any clonidine patch in the hospital, clonidine patch discontinued at discharge ,patient and family agrees -Is discharged on Coreg, Norvasc, hydralazine,  -Advised patient to check blood pressure at home, as his blood pressure might be normalized since now he is started on dialysis -Follow blood pressure medication adjustment per PCP and  nephrology  Chronic right lower extremity ulcer: - Appears well-healing, wound does not appear to be acute or inflamed , he reports follows wound care clinic every Wednesday  -Continue Neurontin 300 mg daily   Obstructive sleep apnea: - Patient reportedly has been on CPAP -report was taking off CPAP after 50 pounds of weight loss  Obesity: Body mass index is 35.81 kg/m. Diet and life style modification      DVT prophylaxis:  apixaban (ELIQUIS) tablet 5 mg     Procedures:  *  Consultations:  *  Discharge Exam: BP (!) 139/94 (BP Location: Right Arm)   Pulse 85   Temp 98.7 F (37.1 C)   Resp 17   Ht 5\' 7"  (1.702 m)   Wt 103.7 kg   SpO2 99%   BMI 35.81 kg/m   General: * Cardiovascular: * Respiratory: *  Discharge Instructions You were cared for by a hospitalist during your hospital stay. If you have any questions about your discharge medications or the care you received while you were in the hospital after you are discharged, you can call the unit and asked to speak with the hospitalist on call if the hospitalist that took care of you is not available. Once you are discharged, your primary care physician will handle any further medical issues. Please note that NO REFILLS for any discharge medications will be authorized once you  are discharged, as it is imperative that you return to your primary care physician (or establish a relationship with a primary care physician if you do not have one) for your aftercare needs so that they can reassess your need for medications and monitor your lab values.  Discharge Instructions    Diet - low sodium heart healthy   Complete by: As directed    Renal /carb modified diet   Discharge instructions   Complete by: As directed    Please check your blood pressure at home twice a day, bring blood pressure records to PCP and nephrology at hospital discharge follow-up.   Discharge wound care:   Complete by: As directed     Follow-up with wound care clinic   Discharge wound care:   Complete by: As directed    Follow up with wound clinic   Increase activity slowly   Complete by: As directed    Increase activity slowly   Complete by: As directed      Allergies as of 01/07/2020   No Known Allergies     Medication List    STOP taking these medications   cloNIDine 0.2 mg/24hr patch Commonly known as: CATAPRES - Dosed in mg/24 hr   furosemide 80 MG tablet Commonly known as: LASIX   labetalol 200 MG tablet Commonly known as: NORMODYNE   multivitamin with minerals Tabs tablet   potassium chloride SA 20 MEQ tablet Commonly known as: KLOR-CON     TAKE these medications   Accu-Chek Softclix Lancets lancets Use as directed.  For insulin-dependent type 2 diabetes Monitor  glucose 3 times a day   acetaminophen 500 MG tablet Commonly known as: TYLENOL Take 1,000-1,500 mg by mouth 2 (two) times daily as needed (pain).   albuterol 108 (90 Base) MCG/ACT inhaler Commonly known as: VENTOLIN HFA Inhale 2 puffs into the lungs every 6 (six) hours as needed for wheezing or shortness of breath.   amLODipine 10 MG tablet Commonly known as: NORVASC Take 1 tablet (10 mg total) by mouth daily.   calcitRIOL 0.25 MCG capsule Commonly known as: ROCALTROL Take 1 capsule (0.25 mcg total) by mouth every Monday, Wednesday, and Friday. Start taking on: January 08, 2020   carvedilol 25 MG tablet Commonly known as: COREG Take 1 tablet (25 mg total) by mouth 2 (two) times daily with a meal.   Eliquis 5 MG Tabs tablet Generic drug: apixaban Take 5 mg by mouth 2 (two) times daily.   fluticasone 50 MCG/ACT nasal spray Commonly known as: FLONASE Place 1 spray into both nostrils 2 (two) times daily.   gabapentin 300 MG capsule Commonly known as: NEURONTIN Take 1 capsule (300 mg total) by mouth daily.   glucose blood test strip Use as instructed For insulin-dependent type 2 diabetes Monitor  glucose 3 times a  day   hydrALAZINE 50 MG tablet Commonly known as: APRESOLINE Take 1 tablet (50 mg total) by mouth every 8 (eight) hours. What changed:   medication strength  how much to take  when to take this   HYDROcodone-acetaminophen 7.5-325 MG tablet Commonly known as: NORCO Take 1 tablet by mouth every 4 (four) hours as needed.   insulin NPH-regular Human (70-30) 100 UNIT/ML injection Inject 25 Units into the skin 2 (two) times daily before a meal.   levocetirizine 5 MG tablet Commonly known as: XYZAL Take 5 mg by mouth at bedtime.   multivitamin Tabs tablet Take 1 tablet by mouth at bedtime.   sevelamer carbonate  800 MG tablet Commonly known as: RENVELA Take 1 tablet (800 mg total) by mouth 3 (three) times daily with meals.   tiZANidine 4 MG tablet Commonly known as: ZANAFLEX Take 4 mg by mouth every 8 (eight) hours as needed for muscle spasms.   traMADol 50 MG tablet Commonly known as: ULTRAM Take 50 mg by mouth 3 (three) times daily as needed (pain).   Vitamin D (Ergocalciferol) 1.25 MG (50000 UNIT) Caps capsule Commonly known as: DRISDOL Take 50,000 Units by mouth every Sunday.            Discharge Care Instructions  (From admission, onward)         Start     Ordered   01/07/20 0000  Discharge wound care:       Comments: Follow up with wound clinic   01/07/20 0947   01/06/20 0000  Discharge wound care:       Comments: Follow-up with wound care clinic   01/06/20 1448         No Known [place] [place] Follow up on 01/08/2020.   Why: Your outpatient dialysis clinic schedule is MWF at 12:15pm. Please arrive 20 minutes early. ON YOUR FIRST DAY, 01/08/20, please arrive at 11:30am to complete paperwork before your first treatment. Contact information: [address] [place] [address] [phone]        [name] Bill, MD Follow up in 1 week(s).   Specialty: Family Medicine Why: hospital discharge  follow up,  Contact information: [place] [address] [phone]        Elmarie Shiley, MD Follow up.   Specialty: Nephrology Contact information: [place] [address] [phone]        Marina Goodell, MD Follow up.   Specialty: Cardiology Why: for paroxysmal afib Contact information: [address] [facility] [address] [place] [address] [phone]        f/u with wound care clinic Follow up.                The results of significant diagnostics from this hospitalization (including imaging, microbiology, ancillary and laboratory) are listed below for reference.    Significant Diagnostic Studies: DG Chest 2 View  Result Date: 12/30/2019 CLINICAL DATA:  Chest pain, right leg swelling EXAM: CHEST - 2 VIEW COMPARISON:  10/25/2019 FINDINGS: Cardiomegaly. No confluent opacities, effusions or edema. No acute bony abnormality. IMPRESSION: Cardiomegaly.  No active disease. Electronically Signed   By: Rolm Baptise M.D.   On: 12/30/2019 10:46   DG Tibia/Fibula Right  Result Date: 12/30/2019 CLINICAL DATA:  Leg wound pain, anterior X 7 months. EXAM: RIGHT TIBIA AND FIBULA - 2 VIEW COMPARISON:  X-ray right tibia fibula 10/25/2019 FINDINGS: There is no evidence of fracture or aggressive appearing focal bone lesions. No cortical erosion or destruction. Similar appearing cortical irregularity of the distal posterior tibial shaft and malleolus likely representing an old healed fracture or possible sessile exostosis. Soft tissues are unremarkable. IMPRESSION: 1. No acute fracture or dislocation of the bones of the right tibia fibula. 2. No radiographic evidence of osteomyelitis. Electronically Signed   By: Iven Finn M.D.   On: 12/30/2019 15:55   CT Head Wo Contrast  Result Date: 12/30/2019 CLINICAL DATA:  Headache new or worsening. Confusion, slurred speech EXAM: CT HEAD WITHOUT CONTRAST TECHNIQUE: Contiguous axial  images were obtained from the base of the skull through the vertex without intravenous contrast.  COMPARISON:  CT head 10/25/2019 FINDINGS: Brain: No evidence of acute infarction, hemorrhage, hydrocephalus, extra-axial collection or mass lesion/mass effect. Vascular: Negative for hyperdense vessel Skull: Negative Sinuses/Orbits: Retention cyst left maxillary sinus. Right mastoid effusion, improved from the prior CT. Remaining sinuses clear. Negative orbit. Other: None IMPRESSION: Negative CT head Electronically Signed   By: Franchot Gallo M.D.   On: 12/30/2019 16:27   IR Fluoro Guide CV Line Right  Result Date: 01/01/2020 INDICATION: End-stage renal disease, no current access EXAM: ULTRASOUND GUIDANCE FOR VASCULAR ACCESS RIGHT INTERNAL JUGULAR PERMANENT HEMODIALYSIS CATHETER Date:  01/01/2020 01/01/2020 12:03 pm Radiologist:  [name]. Daryll Brod, MD Guidance:  Ultrasound and fluoroscopic FLUOROSCOPY TIME:  Fluoroscopy Time: 0 minutes 42 seconds (31 mGy). MEDICATIONS: Ancef 2 g within 1 hour of the procedure ANESTHESIA/SEDATION: Versed 1.0 mg IV; Fentanyl 25 mcg IV; Moderate Sedation Time:  13 minutes The patient was continuously monitored during the procedure by the interventional radiology nurse under my direct supervision. CONTRAST:  None. COMPLICATIONS: None immediate. PROCEDURE: Informed consent was obtained from the patient following explanation of the procedure, risks, benefits and alternatives. The patient understands, agrees and consents for the procedure. All questions were addressed. A time out was performed. Maximal barrier sterile technique utilized including caps, mask, sterile gowns, sterile gloves, large sterile drape, hand hygiene, and 2% chlorhexidine scrub. Under sterile conditions and local anesthesia, right internal jugular micropuncture venous access was performed with ultrasound. Images were obtained for documentation of the patent right internal jugular vein. A guide wire was inserted followed by  a transitional dilator. Next, a 0.035 guidewire was advanced into the IVC with a 5-French catheter. Measurements were obtained from the right venotomy site to the proximal right atrium. In the right infraclavicular chest, a subcutaneous tunnel was created under sterile conditions and local anesthesia. 1% lidocaine with epinephrine was utilized for this. The 23 cm tip to cuff palindrome catheter was tunneled subcutaneously to the venotomy site and inserted into the SVC/RA junction through a valved peel-away sheath. Position was confirmed with fluoroscopy. Images were obtained for documentation. Blood was aspirated from the catheter followed by saline and heparin flushes. The appropriate volume and strength of heparin was instilled in each lumen. Caps were applied. The catheter was secured at the tunnel site with Gelfoam and a pursestring suture. The venotomy site was closed with subcuticular Vicryl suture. Dermabond was applied to the small right neck incision. A dry sterile dressing was applied. The catheter is ready for use. No immediate complications. IMPRESSION: Ultrasound and fluoroscopically guided right internal jugular tunneled hemodialysis catheter (23 cm tip to cuff palindrome catheter). Electronically Signed   By: [name].  Shick M.D.   On: 01/01/2020 12:13   IR US Guide Vasc Access Right  Result Date: 01/01/2020 INDICATION: End-stage renal disease, no current access EXAM: ULTRASOUND GUIDANCE FOR VASCULAR ACCESS RIGHT INTERNAL JUGULAR PERMANENT HEMODIALYSIS CATHETER Date:  01/01/2020 01/01/2020 12:03 pm Radiologist:  [name]. Daryll Brod, MD Guidance:  Ultrasound and fluoroscopic FLUOROSCOPY TIME:  Fluoroscopy Time: 0 minutes 42 seconds (31 mGy). MEDICATIONS: Ancef 2 g within 1 hour of the procedure ANESTHESIA/SEDATION: Versed 1.0 mg IV; Fentanyl 25 mcg IV; Moderate Sedation Time:  13 minutes The patient was continuously monitored during the procedure by the interventional radiology nurse under my direct  supervision. CONTRAST:  None. COMPLICATIONS: None immediate. PROCEDURE: Informed consent was obtained from the patient following explanation of the procedure, risks, benefits and alternatives. The patient understands, agrees and consents for the procedure. All questions were addressed. A time  out was performed. Maximal barrier sterile technique utilized including caps, mask, sterile gowns, sterile gloves, large sterile drape, hand hygiene, and 2% chlorhexidine scrub. Under sterile conditions and local anesthesia, right internal jugular micropuncture venous access was performed with ultrasound. Images were obtained for documentation of the patent right internal jugular vein. A guide wire was inserted followed by a transitional dilator. Next, a 0.035 guidewire was advanced into the IVC with a 5-French catheter. Measurements were obtained from the right venotomy site to the proximal right atrium. In the right infraclavicular chest, a subcutaneous tunnel was created under sterile conditions and local anesthesia. 1% lidocaine with epinephrine was utilized for this. The 23 cm tip to cuff palindrome catheter was tunneled subcutaneously to the venotomy site and inserted into the SVC/RA junction through a valved peel-away sheath. Position was confirmed with fluoroscopy. Images were obtained for documentation. Blood was aspirated from the catheter followed by saline and heparin flushes. The appropriate volume and strength of heparin was instilled in each lumen. Caps were applied. The catheter was secured at the tunnel site with Gelfoam and a pursestring suture. The venotomy site was closed with subcuticular Vicryl suture. Dermabond was applied to the small right neck incision. A dry sterile dressing was applied. The catheter is ready for use. No immediate complications. IMPRESSION: Ultrasound and fluoroscopically guided right internal jugular tunneled hemodialysis catheter (23 cm tip to cuff palindrome catheter).  Electronically Signed   By: [name].  Shick M.D.   On: 01/01/2020 12:13   VAS [place] UPPER EXT VEIN MAPPING (PRE-OP AVF)  Result Date: 01/03/2020 UPPER EXTREMITY VEIN MAPPING  Indications: Pre-dialysis access. History: CKD V.  Limitations: IV/tape left forearm Comparison Study: No prior study Performing Technologist: Sharion Dove RVS  Examination Guidelines: A complete evaluation includes B-mode imaging, spectral Doppler, color Doppler, and power Doppler as needed of all accessible portions of each vessel. Bilateral testing is considered an integral part of a complete examination. Limited examinations for reoccurring indications may be performed as noted. +-----------------+-------------+----------+---------+ Right Cephalic   Diameter (cm)Depth (cm)Findings  +-----------------+-------------+----------+---------+ Prox upper arm       0.39        0.55             +-----------------+-------------+----------+---------+ Mid upper arm        0.42        0.17             +-----------------+-------------+----------+---------+ Dist upper arm       0.37        0.16   branching +-----------------+-------------+----------+---------+ Antecubital fossa    0.65        0.27             +-----------------+-------------+----------+---------+ Prox forearm         0.40        0.57   branching +-----------------+-------------+----------+---------+ Mid forearm          0.35        0.41   branching +-----------------+-------------+----------+---------+ Dist forearm         0.33        0.44             +-----------------+-------------+----------+---------+ Wrist                0.25        0.46             +-----------------+-------------+----------+---------+ +-----------------+-------------+----------+-----------------------------------+ Left Cephalic    Diameter (cm)Depth (cm)  Findings               +-----------------+-------------+----------+-----------------------------------+  Prox upper arm       0.41        0.57                                       +-----------------+-------------+----------+-----------------------------------+ Mid upper arm        0.41        0.26                                       +-----------------+-------------+----------+-----------------------------------+ Dist upper arm       0.45        0.25                                       +-----------------+-------------+----------+-----------------------------------+ Antecubital fossa    0.47        0.37                                       +-----------------+-------------+----------+-----------------------------------+ Prox forearm         0.34        0.45   branching and between prox and mid                                               forearm (bandages in way)      +-----------------+-------------+----------+-----------------------------------+ Mid forearm          0.30        0.45                branching              +-----------------+-------------+----------+-----------------------------------+ Dist forearm         0.31        0.40                                       +-----------------+-------------+----------+-----------------------------------+ Wrist                0.23        0.47                branching              +-----------------+-------------+----------+-----------------------------------+ +-----------------+-------------+----------+---------+ Left Basilic     Diameter (cm)Depth (cm)Findings  +-----------------+-------------+----------+---------+ Prox upper arm       0.51        1.85    origin   +-----------------+-------------+----------+---------+ Mid upper arm        0.47        0.91             +-----------------+-------------+----------+---------+ Dist upper arm       0.40        0.49   branching +-----------------+-------------+----------+---------+ Antecubital fossa    0.39        0.18   branching  +-----------------+-------------+----------+---------+ Prox forearm  0.35        0.24   branching +-----------------+-------------+----------+---------+ Mid forearm          0.27        0.16             +-----------------+-------------+----------+---------+ Distal forearm       0.20        0.24   branching +-----------------+-------------+----------+---------+ Wrist                0.16        0.27             +-----------------+-------------+----------+---------+ *See table(s) above for measurements and observations.  Diagnosing physician: [name] MD Electronically signed by [name] MD on 01/03/2020 at 2:01:46 PM.    Final     Microbiology: Recent Results (from the past 240 hour(s))  SARS Coronavirus 2 by RT PCR (hospital order, performed in [facility] hospital lab) Nasopharyngeal Nasopharyngeal Swab     Status: None   Collection Time: 12/30/19  6:37 PM   Specimen: Nasopharyngeal Swab  Result Value Ref Range Status   SARS Coronavirus 2 NEGATIVE NEGATIVE Final    Comment: (NOTE) SARS-CoV-2 target nucleic acids are NOT DETECTED.  The SARS-CoV-2 RNA is generally detectable in upper and lower respiratory specimens during the acute phase of infection. The lowest concentration of SARS-CoV-2 viral copies this assay can detect is 250 copies / mL. A negative result does not preclude SARS-CoV-2 infection and should not be used as the sole basis for treatment or other patient management decisions.  A negative result may occur with improper specimen collection / handling, submission of specimen other than nasopharyngeal swab, presence of viral mutation(s) within the areas targeted by this assay, and inadequate number of viral copies (<250 copies / mL). A negative result must be combined with clinical observations, patient history, and epidemiological information.  Fact Sheet for Patients:   StrictlyIdeas.no  Fact Sheet for  Healthcare Providers: BankingDealers.co.za  This test is not yet approved or  cleared by the [place] FDA and has been authorized for detection and/or diagnosis of SARS-CoV-2 by FDA under an Emergency Use Authorization (EUA).  This EUA will remain in effect (meaning this test can be used) for the duration of the COVID-19 declaration under Section 564(b)(1) of the Act, 21 U.S.C. section 360bbb-3(b)(1), unless the authorization is terminated or revoked sooner.  Performed at [facility], [place] [address] [place], [place] [address]   MRSA PCR Screening     Status: Abnormal   Collection Time: 01/05/20  8:13 AM   Specimen: Nasal Mucosa; Nasopharyngeal  Result Value Ref Range Status   MRSA by PCR POSITIVE (A) NEGATIVE Final    Comment:        The GeneXpert MRSA Assay (FDA approved for NASAL specimens only), is one component of a comprehensive MRSA colonization surveillance program. It is not intended to diagnose MRSA infection nor to guide or monitor treatment for MRSA infections. RESULT CALLED TO, READ BACK BY AND VERIFIED WITH: Foye Clock RN 10:45 01/05/20 ([email or web address]) Performed at [facility], [place] [address], [place], [place] [address]      Labs: Basic Metabolic Panel: Recent Labs  Lab 01/01/20 0120 01/01/20 0120 01/03/20 0212 01/04/20 1300 01/06/20 0757 01/06/20 1508 01/07/20 0130  NA 135   < > 136 137 136 133* 136  K 3.5   < > 3.6 3.7 3.7 3.3* 3.7  CL 103   < > 102 102 98 96*  98  CO2 18*   < > 20* 22 22 25 26   GLUCOSE 164*   < > 186* 131* 96 152* 86  BUN 88*   < > 69* 49* 43* 15 29*  CREATININE 10.73*   < > 9.49* 7.41* 7.39* 3.79* 5.25*  CALCIUM 8.7*   < > 9.0 9.1 9.4 9.2 9.4  PHOS 7.3*  --   --  5.3* 5.6* 2.7  --    < > = values in this interval not displayed.   Liver Function Tests: Recent Labs  Lab 01/01/20 0120 01/04/20 1300 01/06/20 0757 01/06/20 1508  ALBUMIN 2.4* 2.6* 2.6* 2.9*   No results for input(s): LIPASE,  AMYLASE in the last 168 hours. No results for input(s): AMMONIA in the last 168 hours. CBC: Recent Labs  Lab 01/03/20 0212 01/04/20 1300 01/06/20 0757 01/06/20 1508 01/07/20 0130  WBC 9.9 10.6* 10.2 9.5 9.8  NEUTROABS  --   --   --   --  6.5  HGB 8.5* 8.8* 8.8* 10.3* 9.5*  HCT 26.9* 28.3* 28.9* 32.2* 30.1*  MCV 90.6 92.2 92.6 89.7 92.9  PLT 419* 432* 446* 452* 450*   Cardiac Enzymes: No results for input(s): CKTOTAL, CKMB, CKMBINDEX, TROPONINI in the last 168 hours. BNP: BNP (last 3 results) Recent Labs    10/25/19 2014  BNP 100.2*    ProBNP (last 3 results) No results for input(s): PROBNP in the last 8760 hours.  CBG: Recent Labs  Lab 01/06/20 1329 01/06/20 1619 01/06/20 2008 01/06/20 2047 01/07/20 0722  GLUCAP 92 147* 74 93 108*       Signed:  Florencia Reasons MD, PhD, FACP  [facility] 01/07/2020, 9:56 AM

## 2020-01-06 NOTE — Procedures (Signed)
Patient seen on Hemodialysis. BP (!) 151/89 (BP Location: Right Arm)   Pulse 81   Temp 98.8 F (37.1 C)   Resp 17   Ht 5\' 7"  (1.702 m)   Wt 98.4 kg   SpO2 98%   BMI 33.98 kg/m   QB 400, UF goal 2L Tolerating treatment without complaints at this time.  Elmarie Shiley MD [facility]. Office # [phone] Pager # [phone] 9:18 AM

## 2020-01-06 NOTE — Evaluation (Signed)
Physical Therapy Evaluation Patient Details Name: [name] MRN: [id number] DOB: [birth year] Today's Date: 01/06/2020   History of Present Illness  Pt is a 54 y.o. M with significant PMH of CKD stage IV, diabetes, hypertension, RLE wound, hypertensive encephalopathy and adjustment disorder who presents with worsening renal dysfunction. S/p RIJ dialysis catheter on 9/24, tolerated first dialysis 01/01/2020, s/p AV fistula 9/28.   Clinical Impression  Prior to admission, pt lives with his spouse and is independent. Pt presents with decreased functional mobility secondary to dizziness (see orthostatic vitals below), decreased endurance, RLE pain, and balance deficits. Pt requiring min assist for bed mobility and transfers; ambulating 30 feet with a walker at a min guard assist level. Would benefit from HHPT to address deficits and maximize functional independence.  Orthostatic Vital Signs:  Supine: 143/96 Sitting: 139/94 Standing: 120/96    Follow Up Recommendations Home health PT;Supervision/Assistance - 24 hour    Equipment Recommendations  Rolling walker with 5" wheels;Wheelchair (measurements PT);Wheelchair cushion (measurements PT)    Recommendations for Other Services       Precautions / Restrictions Precautions Precautions: Fall Restrictions Weight Bearing Restrictions: No      Mobility  Bed Mobility Overal bed mobility: Needs Assistance Bed Mobility: Supine to Sit;Sit to Supine     Supine to sit: Min assist Sit to supine: Supervision   General bed mobility comments: MinA for trunk assist to upright  Transfers Overall transfer level: Needs assistance Equipment used: Rolling walker (2 wheeled) Transfers: Sit to/from Stand Sit to Stand: Min assist         General transfer comment: MinA to boost up to standing position  Ambulation/Gait Ambulation/Gait assistance: Min guard Gait Distance (Feet): 30 Feet Assistive device: Rolling walker (2 wheeled) Gait  Pattern/deviations: Step-through pattern;Decreased stride length;Antalgic Gait velocity: decreased Gait velocity interpretation: <1.8 ft/sec, indicate of risk for recurrent falls General Gait Details: Heavy reliance through arms on walker, cues for sequencing, walker use  Stairs            Wheelchair Mobility    Modified Rankin (Stroke Patients Only)       Balance Overall balance assessment: Needs assistance Sitting-balance support: Feet supported Sitting balance-Leahy Scale: Good     Standing balance support: Bilateral upper extremity supported Standing balance-Leahy Scale: Poor                               Pertinent Vitals/Pain Pain Assessment: Faces Faces Pain Scale: Hurts even more Pain Location: RLE Pain Descriptors / Indicators: Aching;Constant;Grimacing;Guarding Pain Intervention(s): Monitored during session;Limited activity within patient's tolerance    Home Living Family/patient expects to be discharged to:: Private residence Living Arrangements: Spouse/significant other;Children Available Help at Discharge: Family;Available PRN/intermittently Type of Home: House Home Access: Level entry     Home Layout: One level Home Equipment: Cane - single point      Prior Function Level of Independence: Independent               Hand Dominance   Dominant Hand: Right    Extremity/Trunk Assessment   Upper Extremity Assessment Upper Extremity Assessment: Overall WFL for tasks assessed    Lower Extremity Assessment Lower Extremity Assessment: RLE deficits/detail RLE Deficits / Details: Chronic wound on shin. Hip/knee WFL, ankle dorsiflexion 4/5    Cervical / Trunk Assessment Cervical / Trunk Assessment: Normal  Communication   Communication: No difficulties  Cognition Arousal/Alertness: Awake/alert Behavior During Therapy: WFL for tasks assessed/performed Overall Cognitive  Status: Impaired/Different from baseline Area of  Impairment: Problem solving                             Problem Solving: Slow processing;Requires verbal cues        General Comments      Exercises     Assessment/Plan    PT Assessment Patient needs continued PT services  PT Problem List Decreased strength;Decreased activity tolerance;Decreased balance;Decreased mobility;Pain       PT Treatment Interventions DME instruction;Gait training;Stair training;Functional mobility training;Therapeutic activities;Therapeutic exercise;Balance training;Patient/family education    PT Goals (Current goals can be found in the Care Plan section)  Acute Rehab PT Goals Patient Stated Goal: go home PT Goal Formulation: With patient/family Time For Goal Achievement: 01/20/20 Potential to Achieve Goals: Good    Frequency Min 3X/week   Barriers to discharge        Co-evaluation               AM-PAC PT "6 Clicks" Mobility  Outcome Measure Help needed turning from your back to your side while in a flat bed without using bedrails?: None Help needed moving from lying on your back to sitting on the side of a flat bed without using bedrails?: A Little Help needed moving to and from a bed to a chair (including a wheelchair)?: A Little Help needed standing up from a chair using your arms (e.g., wheelchair or bedside chair)?: A Little Help needed to walk in hospital room?: A Little Help needed climbing 3-5 steps with a railing? : A Lot 6 Click Score: 18    End of Session Equipment Utilized During Treatment: Gait belt Activity Tolerance: Patient tolerated treatment well Patient left: in bed;with call bell/phone within reach;with family/visitor present Nurse Communication: Mobility status PT Visit Diagnosis: Pain;Difficulty in walking, not elsewhere classified (R26.2);Unsteadiness on feet (R26.81) Pain - Right/Left: Right Pain - part of body: Leg    Time: 2585-2778 PT Time Calculation (min) (ACUTE ONLY): 29  min   Charges:   PT Evaluation $PT Eval Moderate Complexity: 1 Mod PT Treatments $Gait Training: 8-22 mins        Wyona Almas, PT, DPT Acute Rehabilitation Services Pager [phone] Office [phone]   Deno Etienne 01/06/2020, 5:15 PM

## 2020-01-06 NOTE — Progress Notes (Signed)
Patient has been accepted at [facility] on a MWF schedule with a seat time of 12:15pm. He should arrive to his appointments at 11:55am. On his first day at the clinic, patient needs to arrive at 11:30am in order to complete intake paperwork prior to his first treatment.  Navigator will update patient and treatment team and continue to coordinate in order to assist with smooth transition from hospital to OP HD clinic.  Alphonzo Cruise, [facility] Navigator [phone]

## 2020-01-06 NOTE — Progress Notes (Signed)
   VASCULAR SURGERY ASSESSMENT & PLAN:   POD 1 LEFT BRACHIOCEPHALIC FISTULA: The patient is doing well.  His fistula has an excellent thrill and he has a palpable radial pulse.  Follow-up has already been arranged.  Vascular surgery will be available as needed.  SUBJECTIVE:   No complaints.  PHYSICAL EXAM:   Vitals:   01/05/20 1815 01/05/20 1950 01/05/20 2348 01/06/20 0131  BP: (!) 154/94 113/78 131/81   Pulse: 84 79    Resp: 14 20    Temp: 98.3 F (36.8 C) 98 F (36.7 C)    TempSrc:  Oral    SpO2: 95% 97%    Weight:   98.4 kg 98.4 kg  Height:       Good thrill in left upper arm fistula. Palpable left radial pulse. His incision looks fine.  LABS:   CBG (last 3)  Recent Labs    01/05/20 1713 01/05/20 1954 01/06/20 0630  GLUCAP 123* 121* 94    PROBLEM LIST:    Principal Problem:   Acute renal failure superimposed on stage 5 chronic kidney disease, not on chronic dialysis (HCC) Active Problems:   CKD (chronic kidney disease), stage V (HCC)   Essential hypertension, malignant   Diabetes mellitus type 2 in nonobese (HCC)   Acute diastolic congestive heart failure (HCC)   Chronic ulcer of right leg (HCC)   AF (paroxysmal atrial fibrillation) (HCC)   ESRD (end stage renal disease) (HCC)   CURRENT MEDS:   . amLODipine  10 mg Oral Daily  . apixaban  5 mg Oral BID  . calcitRIOL  0.25 mcg Oral Q M,W,F  . carvedilol  25 mg Oral BID WC  . Chlorhexidine Gluconate Cloth  6 each Topical Q0600  . [START ON 01/09/2020] cloNIDine  0.2 mg Transdermal Q Sat  . [START ON 01/07/2020] darbepoetin (ARANESP) injection - DIALYSIS  40 mcg Intravenous Q Thu-HD  . feeding supplement (NEPRO CARB STEADY)  237 mL Oral BID BM  . fluticasone  1 spray Each Nare BID  . furosemide  120 mg Oral Daily  . gabapentin  300 mg Oral Daily  . hydrALAZINE  100 mg Oral QID  . hydrALAZINE  50 mg Oral Q8H  . insulin aspart  0-5 Units Subcutaneous QHS  . insulin aspart  0-6 Units Subcutaneous TID  WC  . insulin aspart protamine- aspart  25 Units Subcutaneous BID WC  . loratadine  10 mg Oral QHS  . multivitamin  1 tablet Oral QHS  . mupirocin ointment  1 application Nasal BID  . potassium chloride SA  20 mEq Oral q morning - 10a  . sevelamer carbonate  800 mg Oral TID WC  . [START ON 01/10/2020] Vitamin D (Ergocalciferol)  50,000 Units Oral Q Driscilla Grammes Office: [phone] 01/06/2020

## 2020-01-07 LAB — CBC WITH DIFFERENTIAL/PLATELET
Abs Immature Granulocytes: 0.03 10*3/uL (ref 0.00–0.07)
Basophils Absolute: 0.1 10*3/uL (ref 0.0–0.1)
Basophils Relative: 1 %
Eosinophils Absolute: 0.3 10*3/uL (ref 0.0–0.5)
Eosinophils Relative: 4 %
HCT: 30.1 % — ABNORMAL LOW (ref 39.0–52.0)
Hemoglobin: 9.5 g/dL — ABNORMAL LOW (ref 13.0–17.0)
Immature Granulocytes: 0 %
Lymphocytes Relative: 21 %
Lymphs Abs: 2 10*3/uL (ref 0.7–4.0)
MCH: 29.3 pg (ref 26.0–34.0)
MCHC: 31.6 g/dL (ref 30.0–36.0)
MCV: 92.9 fL (ref 80.0–100.0)
Monocytes Absolute: 0.8 10*3/uL (ref 0.1–1.0)
Monocytes Relative: 9 %
Neutro Abs: 6.5 10*3/uL (ref 1.7–7.7)
Neutrophils Relative %: 65 %
Platelets: 450 10*3/uL — ABNORMAL HIGH (ref 150–400)
RBC: 3.24 MIL/uL — ABNORMAL LOW (ref 4.22–5.81)
RDW: 13.6 % (ref 11.5–15.5)
WBC: 9.8 10*3/uL (ref 4.0–10.5)
nRBC: 0 % (ref 0.0–0.2)

## 2020-01-07 LAB — GLUCOSE, CAPILLARY: Glucose-Capillary: 108 mg/dL — ABNORMAL HIGH (ref 70–99)

## 2020-01-07 LAB — BASIC METABOLIC PANEL
Anion gap: 12 (ref 5–15)
BUN: 29 mg/dL — ABNORMAL HIGH (ref 6–20)
CO2: 26 mmol/L (ref 22–32)
Calcium: 9.4 mg/dL (ref 8.9–10.3)
Chloride: 98 mmol/L (ref 98–111)
Creatinine, Ser: 5.25 mg/dL — ABNORMAL HIGH (ref 0.61–1.24)
GFR calc Af Amer: 13 mL/min — ABNORMAL LOW (ref >60)
GFR calc non Af Amer: 12 mL/min — ABNORMAL LOW (ref >60)
Glucose, Bld: 86 mg/dL (ref 70–99)
Potassium: 3.7 mmol/L (ref 3.5–5.1)
Sodium: 136 mmol/L (ref 135–145)

## 2020-01-07 NOTE — Progress Notes (Signed)
Patient ID: [name], male   DOB: 1966/02/07, 54 y.o.   MRN: [id number] Discharge plans canceled yesterday after he developed orthostasis/hypotension on attempted ambulation after dialysis.  Overnight, he has done well and has been able to ambulate around his room without any additional dizziness.  Anticipate discharge home today with continued outpatient dialysis at [facility] on M/W/F schedule. Elmarie Shiley MD [facility]. Office # [phone] Pager # [phone] 8:18 AM

## 2020-01-07 NOTE — Plan of Care (Signed)

## 2020-01-07 NOTE — TOC Progression Note (Addendum)
Transition of Care [facility]) - Progression Note    Patient Details  Name: [name] MRN: [id number] Date of Birth: 08/10/1965  Transition of [facility]) CM/SW Contact  Angelita Ingles, RN Phone Number: [phone]  01/07/2020, 11:21 AM  Clinical Narrative:    CM consulted for [facility] and DME. Wheelchair has been  ordered through [employer]. CM went to room to offer choice and discuss [facility] services and patient has already been discharged. DME has not been delivered and [facility] has not been set up. CM spoke with bedside nurse who states that she was unaware. CM will attempt to contact patient. CM has attempted to call patient with no answer. Will attempt again and document accordingly. CM called number on file which is the patients mothers number [phone]. The mother asked CM to call patient s cell but would not provide cell number. CM called Louie Casa who is listed as spouse. Louie Casa states that the patient is trying to get settled in. CM asked Louie Casa is she could please have patient call the CM back in about 30 minutes. CM will await return call.   1200 CM spoke with [facility] and they will follow up with patient and set up delivery of wheelchair to the home. Patient is not able to get wheelchair and rolling walker due to insurance will only pay for 1 mobility device.   Maud have been set up through [facility]. Agency will contact patient to set up start of services date. No further needs noted at this time. CM will sign off.       Expected Discharge Plan and Services           Expected Discharge Date: 01/07/20                                     Social Determinants of Health (SDOH) Interventions    Readmission Risk Interventions No flowsheet data found.

## 2020-01-07 NOTE — Progress Notes (Addendum)
Physical Therapy Treatment Patient Details Name: [name] MRN: [id number] DOB: 1966-01-21 Today's Date: 01/07/2020    History of Present Illness Pt is a 54 y.o. M with significant PMH of CKD stage IV, diabetes, hypertension, RLE wound, hypertensive encephalopathy and adjustment disorder who presents with worsening renal dysfunction. S/p RIJ dialysis catheter on 9/24, tolerated first dialysis 01/01/2020, s/p AV fistula 9/28.     PT Comments    Patient received in bed, pleasant and cooperative today. Demonstrates significant improvement in functional mobility but does need ongoing cues for problem solving and safety/hand placement. Able to progress gait distance without signs of orthostatics but very limited by pain in LLE. Needed on sitting rest break during gait distance. Education provided on potential use of rollator over RW at home, also encouraged follow-up with skilled wound care services to continue addressing painful tibial wound. Left in bed with all needs met, RN aware of patient status.     Follow Up Recommendations  Home health PT;Supervision/Assistance - 24 hour     Equipment Recommendations  Rolling walker with 5" wheels;Wheelchair (measurements PT);Wheelchair cushion (measurements PT);Other (comment) (may benefit more from rollator than RW if we have one available- will trial next session)    Recommendations for Other Services       Precautions / Restrictions Precautions Precautions: Fall Restrictions Weight Bearing Restrictions: No    Mobility  Bed Mobility Overal bed mobility: Needs Assistance Bed Mobility: Supine to Sit;Sit to Supine     Supine to sit: Modified independent (Device/Increase time);HOB elevated Sit to supine: Modified independent (Device/Increase time);HOB elevated   General bed mobility comments: use of rails, extended time  Transfers Overall transfer level: Needs assistance Equipment used: Rolling walker (2 wheeled) Transfers: Sit to/from  Stand Sit to Stand: Min guard;Min assist         General transfer comment: fluctuates from min guard to very light minA to boost to full upright  Ambulation/Gait Ambulation/Gait assistance: Min guard Gait Distance (Feet): 50 Feet (63f + 135f Assistive device: Rolling walker (2 wheeled) Gait Pattern/deviations: Step-through pattern;Decreased stride length;Antalgic Gait velocity: decreased   General Gait Details: Heavy reliance through arms on walker, cues for sequencing, walker use; slow and steady but limited by pain. no signs of orthostasis with activity.   Stairs             Wheelchair Mobility    Modified Rankin (Stroke Patients Only)       Balance Overall balance assessment: Needs assistance Sitting-balance support: Feet supported Sitting balance-Leahy Scale: Good     Standing balance support: Bilateral upper extremity supported Standing balance-Leahy Scale: Fair                              Cognition Arousal/Alertness: Awake/alert Behavior During Therapy: WFL for tasks assessed/performed;Flat affect Overall Cognitive Status: Impaired/Different from baseline Area of Impairment: Problem solving                             Problem Solving: Slow processing;Requires verbal cues General Comments: ongoing cues for sequencing/hand placement      Exercises      General Comments General comments (skin integrity, edema, etc.): no signs of orthostasis during session- main impairment from pain in LE      Pertinent Vitals/Pain Pain Assessment: Faces Faces Pain Scale: Hurts whole lot Pain Location: RLE Pain Descriptors / Indicators: Aching;Constant;Grimacing;Guarding Pain Intervention(s): Limited activity within patient's  tolerance;Monitored during session;Patient requesting pain meds-RN notified    Home Living                      Prior Function            PT Goals (current goals can now be found in the care plan  section) Acute Rehab PT Goals Patient Stated Goal: go home PT Goal Formulation: With patient/family Time For Goal Achievement: 01/20/20 Potential to Achieve Goals: Good Progress towards PT goals: Progressing toward goals    Frequency    Min 3X/week      PT Plan Current plan remains appropriate    Co-evaluation              AM-PAC PT "6 Clicks" Mobility   Outcome Measure  Help needed turning from your back to your side while in a flat bed without using bedrails?: None Help needed moving from lying on your back to sitting on the side of a flat bed without using bedrails?: None Help needed moving to and from a bed to a chair (including a wheelchair)?: A Little Help needed standing up from a chair using your arms (e.g., wheelchair or bedside chair)?: A Little Help needed to walk in hospital room?: A Little Help needed climbing 3-5 steps with a railing? : A Lot 6 Click Score: 19    End of Session   Activity Tolerance: Patient tolerated treatment well Patient left: in bed;with call bell/phone within reach Nurse Communication: Mobility status;Patient requests pain meds PT Visit Diagnosis: Pain;Difficulty in walking, not elsewhere classified (R26.2);Unsteadiness on feet (R26.81) Pain - Right/Left: Right Pain - part of body: Leg     Time: 0850-0919 PT Time Calculation (min) (ACUTE ONLY): 29 min  Charges:  $Gait Training: 8-22 mins $Self Care/Home Management: 8-22                     Windell Norfolk, DPT, PN1   Supplemental Physical Therapist [place]    Pager [phone] Acute Rehab Office [phone]

## 2020-01-08 ENCOUNTER — Telehealth: Payer: Self-pay | Admitting: Physician Assistant

## 2020-01-08 NOTE — Telephone Encounter (Signed)
Transition of care contact from inpatient facility  Date of discharge: 01/07/20 Date of contact: 01/08/2020 Method: Phone Spoke to: Patient's mother  Patient contacted to discuss transition of care from recent inpatient hospitalization. Patient was admitted to [facility] from 12/30/19-01/07/20 with discharge diagnosis of ESRD, A fib, chronic ulcer, CHF and T2DM. Patient is at dialysis currently but I spoke to his mother.   Medication changes were reviewed. We will follow up with patient at his outpatient dialysis unit.   Anice Paganini, PA-C 01/08/2020, 2:39 PM  [employer]

## 2020-01-12 DIAGNOSIS — Z09 Encounter for follow-up examination after completed treatment for conditions other than malignant neoplasm: Secondary | ICD-10-CM | POA: Insufficient documentation

## 2020-01-13 ENCOUNTER — Encounter (HOSPITAL_BASED_OUTPATIENT_CLINIC_OR_DEPARTMENT_OTHER): Payer: Medicare Other | Attending: Physician Assistant | Admitting: Physician Assistant

## 2020-01-13 DIAGNOSIS — I872 Venous insufficiency (chronic) (peripheral): Secondary | ICD-10-CM | POA: Diagnosis not present

## 2020-01-13 DIAGNOSIS — D1801 Hemangioma of skin and subcutaneous tissue: Secondary | ICD-10-CM | POA: Diagnosis not present

## 2020-01-13 DIAGNOSIS — I890 Lymphedema, not elsewhere classified: Secondary | ICD-10-CM | POA: Insufficient documentation

## 2020-01-13 DIAGNOSIS — L97812 Non-pressure chronic ulcer of other part of right lower leg with fat layer exposed: Secondary | ICD-10-CM | POA: Diagnosis not present

## 2020-01-13 DIAGNOSIS — E11622 Type 2 diabetes mellitus with other skin ulcer: Secondary | ICD-10-CM | POA: Diagnosis not present

## 2020-01-13 NOTE — Progress Notes (Signed)
[name] ([id number]) Visit Report for 01/13/2020 Arrival Information Details Patient Name: Date of Service: [name], [name] 01/13/2020 8:45 A M Medical Record Number: [id number] Patient Account Number: [id number] Date of Birth/Sex: Treating RN: 1965/05/01 (54 y.o. [name], Meta.Reding Primary Care Shalimar Mcclain: Precious Haws Other Clinician: Referring Arnold Depinto: Treating Katilin Raynes/Extender: [name], [name] in Treatment: 23 Visit Information History Since Last Visit Added or deleted any medications: No Patient Arrived: Stretcher Any new allergies or adverse reactions: No Arrival Time: 08:34 Had a fall or experienced change in No Accompanied By: mother activities of daily living that may affect Transfer Assistance: None risk of falls: Patient Identification Verified: Yes Signs or symptoms of abuse/neglect since last visito No Secondary Verification Process Completed: Yes Hospitalized since last visit: Yes Patient Requires Transmission-Based Precautions: No Implantable device outside of the clinic excluding No Patient Has Alerts: No cellular tissue based products placed in the center since last visit: Has Dressing in Place as Prescribed: No Has Compression in Place as Prescribed: No Pain Present Now: Yes Electronic Signature(s) Signed: 01/13/2020 5:54:43 PM By: Deon Pilling Entered By: Deon Pilling on 01/13/2020 08:35:29 -------------------------------------------------------------------------------- Encounter Discharge Information Details Patient Name: Date of Service: [name], [name] 01/13/2020 8:45 A M Medical Record Number: [id number] Patient Account Number: [id number] Date of Birth/Sex: Treating RN: [birth year] (54 y.o. [name]) [name] Primary Care Janyce Ellinger: Precious Haws Other Clinician: Referring Leisa Gault: Treating Naryiah Schley/Extender: [name], [name] in Treatment: 23 Encounter Discharge Information Items Post Procedure Vitals Discharge  Condition: Stable Temperature (F): 97.5 Ambulatory Status: Walker Pulse (bpm): 80 Discharge Destination: Home Respiratory Rate (breaths/min): 18 Transportation: Private Auto Blood Pressure (mmHg): 126/80 Accompanied By: self Schedule Follow-up Appointment: Yes Clinical Summary of Care: Patient Declined Electronic Signature(s) Signed: 01/13/2020 5:53:32 PM By: [name] RN Entered By: [name] on 01/13/2020 09:41:41 -------------------------------------------------------------------------------- Lower Extremity Assessment Details Patient Name: Date of Service: [name], [name] 01/13/2020 8:45 A M Medical Record Number: [id number] Patient Account Number: [id number] Date of Birth/Sex: Treating RN: Jul 06, 1965 (54 y.o. [name] Primary Care Jerrian Mells: Precious Haws Other Clinician: Referring Shakila Mak: Treating Doreather Hoxworth/Extender: [name], [name] in Treatment: 23 Edema Assessment Assessed: [Left: No] [Right: Yes] Edema: [Left: Ye] [Right: s] Calf Left: Right: Point of Measurement: 31 cm From Medial Instep 42.3 cm Ankle Left: Right: Point of Measurement: 8 cm From Medial Instep 24 cm Electronic Signature(s) Signed: 01/13/2020 5:54:43 PM By: Deon Pilling Entered By: Deon Pilling on 01/13/2020 08:50:54 -------------------------------------------------------------------------------- [place] Details Patient Name: Date of Service: [name], [name] 01/13/2020 8:45 A M Medical Record Number: [id number] Patient Account Number: [id number] Date of Birth/Sex: Treating RN: 1965/09/25 (54 y.o. [name] Primary Care Dresden Lozito: Precious Haws Other Clinician: Referring Olesya Wike: Treating Cecile Guevara/Extender: [name], [name] in Treatment: 23 Active Inactive Venous Leg Ulcer Nursing Diagnoses: Knowledge deficit related to disease process and management Potential for venous Insuffiency (use before diagnosis  confirmed) Goals: Patient will maintain optimal edema control Date Initiated: 09/09/2019 Target Resolution Date: 01/27/2020 Goal Status: Active Patient/caregiver will verbalize understanding of disease process and disease management Date Initiated: 09/09/2019 Date Inactivated: 10/14/2019 Target Resolution Date: 10/07/2019 Goal Status: Met Interventions: Assess peripheral edema status every visit. Compression as ordered Provide education on venous insufficiency Treatment Activities: Therapeutic compression applied : 09/09/2019 Notes: Wound/Skin Impairment Nursing Diagnoses: Knowledge deficit related to ulceration/compromised skin integrity Goals: Patient/caregiver will verbalize understanding of skin care regimen Date Initiated: 08/04/2019 Target Resolution Date: 01/27/2020 Goal  Status: Active Ulcer/skin breakdown will have a volume reduction of 30% by week 4 Date Initiated: 08/04/2019 Date Inactivated: 09/09/2019 Target Resolution Date: 09/11/2019 Goal Status: Met Ulcer/skin breakdown will have a volume reduction of 50% by week 8 Date Initiated: 09/09/2019 Date Inactivated: 10/14/2019 Target Resolution Date: 10/07/2019 Goal Status: Unmet Unmet Reason: awaiting reflux studies Ulcer/skin breakdown will have a volume reduction of 80% by week 12 Date Initiated: 10/14/2019 Date Inactivated: 11/18/2019 Target Resolution Date: 11/04/2019 Goal Status: Unmet Unmet Reason: uncontrolled htn Interventions: Assess patient/caregiver ability to obtain necessary supplies Assess patient/caregiver ability to perform ulcer/skin care regimen upon admission and as needed Assess ulceration(s) every visit Notes: Electronic Signature(s) Signed: 01/13/2020 6:14:29 PM By: Baruch Gouty RN, BSN Entered By: Baruch Gouty on 01/13/2020 08:57:53 -------------------------------------------------------------------------------- Pain Assessment Details Patient Name: Date of Service: [name], [name] 01/13/2020 8:45 A  M Medical Record Number: [id number] Patient Account Number: [id number] Date of Birth/Sex: Treating RN: 11/05/1965 (54 y.o. [name] Primary Care Sanjuana Mruk: Precious Haws Other Clinician: Referring Jeremaih Klima: Treating Kelsay Haggard/Extender: [name], [name] in Treatment: 23 Active Problems Location of Pain Severity and Description of Pain Patient Has Paino Yes Site Locations Pain Location: Pain in Ulcers Rate the pain. Current Pain Level: 7 Worst Pain Level: 10 Least Pain Level: 0 Tolerable Pain Level: 8 Pain Management and Medication Current Pain Management: Medication: No Cold Application: No Rest: No Massage: No Activity: No T.E.N.S.: No Heat Application: No Leg drop or elevation: No Is the Current Pain Management Adequate: Adequate How does your wound impact your activities of daily livingo Sleep: No Bathing: No Appetite: No Relationship With Others: No Bladder Continence: No Emotions: No Bowel Continence: No Work: No Toileting: No Drive: No Dressing: No Hobbies: No Electronic Signature(s) Signed: 01/13/2020 5:54:43 PM By: Deon Pilling Entered By: Deon Pilling on 01/13/2020 08:50:45 -------------------------------------------------------------------------------- Patient/Caregiver Education Details Patient Name: Date of Service: [name], [name] 10/6/2021andnbsp8:45 A M Medical Record Number: [id number] Patient Account Number: [id number] Date of Birth/Gender: Treating RN: [birth year] (54 y.o. [name] Primary Care Physician: Precious Haws Other Clinician: Referring Physician: Treating Physician/Extender: Burnard Hawthorne in Treatment: 23 Education Assessment Education Provided To: Patient Education Topics Provided Venous: Methods: Explain/Verbal Responses: Reinforcements needed, State content correctly Wound/Skin Impairment: Methods: Explain/Verbal Responses: Reinforcements needed, State content  correctly Electronic Signature(s) Signed: 01/13/2020 6:14:29 PM By: Baruch Gouty RN, BSN Entered By: Baruch Gouty on 01/13/2020 08:59:43 -------------------------------------------------------------------------------- Wound Assessment Details Patient Name: Date of Service: [name], [name] 01/13/2020 8:45 A M Medical Record Number: [id number] Patient Account Number: [id number] Date of Birth/Sex: Treating RN: 08/27/1965 (54 y.o. [name] Primary Care Koki Buxton: Precious Haws Other Clinician: Referring Markis Langland: Treating Jaianna Nicoll/Extender: [name], [name] in Treatment: 23 Wound Status Wound Number: 2 Primary Venous Leg Ulcer Etiology: Wound Location: Right, Anterior Lower Leg Wound Open Wounding Event: Trauma Status: Date Acquired: 11/30/2019 Comorbid Lymphedema, Sleep Apnea, Arrhythmia, Congestive Heart Failure, Weeks Of Treatment: 6 History: Hypertension, Peripheral Arterial Disease, Type II Diabetes Clustered Wound: No Wound Measurements Length: (cm) 12 Width: (cm) 5.2 Depth: (cm) 0.1 Area: (cm) 49.009 Volume: (cm) 4.901 % Reduction in Area: -828.6% % Reduction in Volume: -828.2% Epithelialization: None Tunneling: No Undermining: No Wound Description Classification: Full Thickness Without Exposed Support Structures Wound Margin: Flat and Intact Exudate Amount: None Present Foul Odor After Cleansing: No Slough/Fibrino Yes Wound Bed Granulation Amount: None Present (0%) Exposed Structure Necrotic Amount: Large (67-100%) Fascia Exposed: No Necrotic Quality: Eschar, Adherent  Slough Fat Layer (Subcutaneous Tissue) Exposed: Yes Tendon Exposed: No Muscle Exposed: No Joint Exposed: No Bone Exposed: No Treatment Notes Wound #2 (Right, Anterior Lower Leg) 1. Cleanse With Wound Cleanser Soap and water 3. Primary Dressing Applied Santyl 4. Secondary Dressing Dry Gauze Roll Gauze 5. Secured With Tape Other (specify in  notes) Notes netting Electronic Signature(s) Signed: 01/13/2020 5:54:43 PM By: Deon Pilling Entered By: Deon Pilling on 01/13/2020 08:51:45 -------------------------------------------------------------------------------- Vitals Details Patient Name: Date of Service: [name], [name] 01/13/2020 8:45 A M Medical Record Number: [id number] Patient Account Number: [id number] Date of Birth/Sex: Treating RN: 1966-01-11 (54 y.o. [name] Primary Care Jeriann Sayres: Precious Haws Other Clinician: Referring Elliet Goodnow: Treating Rosaleah Person/Extender: [name], [name] in Treatment: 23 Vital Signs Time Taken: 08:45 Temperature (F): 97.5 Height (in): 66 Pulse (bpm): 80 Weight (lbs): 256 Respiratory Rate (breaths/min): 18 Body Mass Index (BMI): 41.3 Blood Pressure (mmHg): 126/80 Capillary Blood Glucose (mg/dl): 153 Reference Range: 80 - 120 mg / dl Electronic Signature(s) Signed: 01/13/2020 5:54:43 PM By: Deon Pilling Entered By: Deon Pilling on 01/13/2020 08:50:31

## 2020-01-13 NOTE — Progress Notes (Signed)
Carvey, JAHI ([id number]) Visit Report for 01/13/2020 Chief Complaint Document Details Patient Name: Date of Service: [name], [name] 01/13/2020 8:45 A M Medical Record Number: [id number] Patient Account Number: [id number] Date of Birth/Sex: Treating RN: 1965-08-01 (54 y.o. [name] Primary Care Provider: Precious Haws Other Clinician: Referring Provider: Treating Provider/Extender: [name], [name] in Treatment: 23 Information Obtained from: Patient Chief Complaint 08/04/2019; patient is here for review of the wound on the right lateral lower leg Electronic Signature(s) Signed: 01/13/2020 8:59:16 AM By: Worthy Keeler PA-C Entered By: Worthy Keeler on 01/13/2020 08:59:16 -------------------------------------------------------------------------------- Debridement Details Patient Name: Date of Service: [name], [name] 01/13/2020 8:45 A M Medical Record Number: [id number] Patient Account Number: [id number] Date of Birth/Sex: Treating RN: [birth year] (54 y.o. [name] Primary Care Provider: Precious Haws Other Clinician: Referring Provider: Treating Provider/Extender: Katheran Awe Weeks in Treatment: 23 Debridement Performed for Assessment: Wound #2 Right,Anterior Lower Leg Performed By: Physician Worthy Keeler, PA Debridement Type: Debridement Severity of Tissue Pre Debridement: Fat layer exposed Level of Consciousness (Pre-procedure): Awake and Alert Pre-procedure Verification/Time Out Yes - 09:10 Taken: Start Time: 09:11 Pain Control: Other : benzocaine 20% pain T Area Debrided (L x W): otal 9.5 (cm) x 4.5 (cm) = 42.75 (cm) Tissue and other material debrided: Viable, Non-Viable, Eschar, Subcutaneous Level: Skin/Subcutaneous Tissue Debridement Description: Excisional Instrument: Curette Bleeding: Minimum Hemostasis Achieved: Pressure Procedural Pain: 9 Post Procedural Pain: 5 Response to Treatment: Procedure was tolerated  well Level of Consciousness (Post- Awake and Alert procedure): Post Debridement Measurements of Total Wound Length: (cm) 12 Width: (cm) 5.2 Depth: (cm) 0.1 Volume: (cm) 4.901 Character of Wound/Ulcer Post Debridement: Requires Further Debridement Severity of Tissue Post Debridement: Fat layer exposed Post Procedure Diagnosis Same as Pre-procedure Electronic Signature(s) Signed: 01/13/2020 6:14:29 PM By: Baruch Gouty RN, BSN Signed: 01/13/2020 6:38:49 PM By: Worthy Keeler PA-C Entered By: Baruch Gouty on 01/13/2020 09:18:44 -------------------------------------------------------------------------------- HPI Details Patient Name: Date of Service: [name], [name] 01/13/2020 8:45 A M Medical Record Number: [id number] Patient Account Number: [id number] Date of Birth/Sex: Treating RN: [birth year] (54 y.o. [name] Primary Care Provider: Precious Haws Other Clinician: Referring Provider: Treating Provider/Extender: [name], [name] in Treatment: 23 History of Present Illness HPI Description: ADMISSION 08/04/2019 This is a 54 year old man who traumatized his right lateral lower leg while going [place] an escal[place] at the mall sometime in December 2020. He was referred to vascular surgery and on 04/14/2019 he saw Dr. Wenda Overland. He was felt to have "palpable pulses and normal noninvasive studies" although I cannot see these results. He was back in his primary doctor's office on 07/30/2019 for a nonhealing right lower extremity wound. He was referred to wound care I think in [facility] but his insurance was out of network. A CNS was ordered. X-ray was done that was negative. He has been using wound cleanser and a Band-Aid. Past medical history is actually quite extensive and includes chronic kidney disease stage IV, chronic diastolic heart failure, atrial fibrillation, poorly controlled hypertension, poorly controlled diabetes with peripheral neuropathy and a recent  hemoglobin A1c of 11.5, depression, lymphedema and sleep apnea ABI in our clinic was 1.1 in the right 5/6; this wound was initially traumatized while the patient was going down on an escalator. Arrived in clinic last week with a completely nonviable surface. He required mechanical debridement and we have been using Iodoflex under compression. Apparently the compression wraps  fell down. He also has poorly controlled diabetes 5/15; traumatic/contusion wound to the right lateral lower leg. We have been using Iodoflex to clean at the surface of this which was completely nonviable along with mechanical debridements. He is under compression. 09/02/2019 upon evaluation today patient appears to be doing well with regard to his wound on his right lateral leg. He has been tolerating the dressing changes without complication. Fortunately there is no signs of active infection at this time. No fevers, chills, nausea, vomiting, or diarrhea. With that being said there is some necrotic tissue around the edges of the wound that is and require some sharp debridement today. That was discussed with the patient and his mother in the office today prior to debridement to which she did consent. 09/09/2019 upon evaluation today patient appears to be making progress in regard to his wound. Fortunately the wound is not nearly as deep as what it was. It is measuring a little bit larger due to having to clear some of the edges away but again that is necessary to get this to heal. Fortunately there is no evidence of active infection at this time. No fevers, chills, nausea, vomiting, or diarrhea. 09/16/2019 on evaluation today patient appears to be doing excellent in regard to his wound currently. In fact I do not see anything that we can need to debride today which is great news. The wound bed looks healthy and the edges of the wound look like they are doing great. Overall I feel like we are at a good place and headed in the proper  direction. 09/23/2019 upon evaluation today patient appears to be doing okay in regard to his wound. This is not measuring any smaller it is about the same. With that being said he tells me that his wrap actually slid down he ended up having to take this off he was on a trip and did not have his Velcro compression with him. With that being said I do not see any signs of anything significantly worsening which is good news. 09/30/2019 upon evaluation today patient appears to be doing a little bit better in regard to his leg ulcer. He has been tolerating the dressing changes without complication. Fortunately there is no signs of active infection at this time. No fevers, chills, nausea, vomiting, or diarrhea. 10/14/2019 upon evaluation today patient's wound appears to be healthier. Overall but unfortunately is measuring a little bit bigger. With that being said I do think that we do need to still continue to wrap his leg. Fortunately there is no signs of active infection at this time which is great news. 11/18/2019 upon evaluation today patient actually appears to be doing decently well with regard to his wound. He has been tolerating the dressing changes without complication. Fortunately there is no signs of active infection has been using just Neosporin at this point since he was in the hospital. He did have a venous Doppler study in the hospital though I cannot see the results that was from November 04, 2019. Nonetheless overall I am pleased with the fact the patient does seem to be doing better. 11/25/2019 on evaluation today patient appears to be doing well with regard to his wounds. He has been tolerating the dressing changes without complication. Fortunately there is no signs of active infection at this time. No fevers, chills, nausea, vomiting, or diarrhea. I do believe that he does not appear to be showing any signs of worsening although he also seems to be having some  trouble getting this to proceed in a  good fashion towards healing. 12/02/2019 on evaluation today patient actually appears to be doing not quite as well in regard to his wounds. Both have some slough noted on the surface of the wound. Unfortunately he is continuing to have some issues here with pain as well in the anterior portion which I think is an issue. Fortunately there is no signs of active infection at this time which is great news. No fevers, chills, nausea, vomiting, or diarrhea. 12/30/2019 upon evaluation today patient unfortunately has not been seen in the past month we are just now seeing him back for reevaluation today last time I saw him was August 25. At that point he had a lateral wound on his leg and an anterior wound which which is starting. The lateral has healed unfortunately the anterior looks worse. With that being said the patient unfortunately seems to be having some issues today that I cannot completely explain by just the way the wound appears. The wound itself is not really hot to touch and to be honest I would not necessarily assume infection upon initial inspection. With that being said he is acting very lethargic, was weak walking into the clinic according to his mother who is with him today, his blood pressure is actually in a normal range at around 110/73 which is unusual for him he normally runs extremely high in the 244W for systolic. His blood glucose was 227 here in the clinic today respiratory rate was 18 and his temperature was 97.7. Pulse was right around 70 when I checked this manually. Nonetheless although his vital signs and normal circumstances would be reassuring I am still kind of concerned just because of the way he is acting. I did question him about medications that he has taken he told me that he took 2 Tylenol and tramadol this morning he also tells me he took 2 tramadol last night. With that being said I do not believe that just taking the tramadol is accounting for what is going on as well  based on what I see today. I discussed all this with his mother as well as the patient face-to-face in the office today and subsequently I think I would recommend further evaluation at the ER as I really feel like there is something going on here that I cannot identify just here in the clinic. 01/13/2020 upon evaluation today patient presents for follow-up concerning ongoing issues with his right leg ulcer. After I last saw him 2 weeks ago he was actually sent to the ER where subsequently he ended up being placed on dialysis. He is doing better although he still seems very tired I think still in general he is doing much better. Fortunately there is no signs of active infection at this time. No fevers, chills, nausea, vomiting, or diarrhea. Electronic Signature(s) Signed: 01/13/2020 9:28:24 AM By: Worthy Keeler PA-C Entered By: Worthy Keeler on 01/13/2020 10:27:25 -------------------------------------------------------------------------------- Physical Exam Details Patient Name: Date of Service: [name], [name] 01/13/2020 8:45 A M Medical Record Number: [id number] Patient Account Number: [id number] Date of Birth/Sex: Treating RN: February 20, 1966 (54 y.o. [name] Primary Care Provider: Precious Haws Other Clinician: Referring Provider: Treating Provider/Extender: [name], [name] in Treatment: 23 Constitutional Chronically ill appearing but in no apparent acute distress. Respiratory normal breathing without difficulty. Psychiatric this patient is able to make decisions and demonstrates good insight into disease process. Alert and Oriented x 3. pleasant and  cooperative. Notes Upon inspection patient's wound bed actually showed signs of being extremely dry during the time he was in the hospital it does not appear that this was managed according to what the patient and his mother tell me today. With all that being said I feel like that the patient is currently doing better  than he was at last visit. We are getting need to try to loosen up some of this necrotic tissue to be honest. I did perform sharp debridement to clear some of this away but will get a probably need Santyl to continue this especially with the amount of pain that he experienced with even what I was doing lightly. Electronic Signature(s) Signed: 01/13/2020 9:29:32 AM By: Worthy Keeler PA-C Entered By: Worthy Keeler on 01/13/2020 09:29:31 -------------------------------------------------------------------------------- Physician Orders Details Patient Name: Date of Service: [name], [name] 01/13/2020 8:45 A M Medical Record Number: [id number] Patient Account Number: [id number] Date of Birth/Sex: Treating RN: 08-Jan-1966 (54 y.o. 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 Dressing Change Frequency Wound #2 Right,Anterior Lower Leg Change dressing every day. Skin Barriers/Peri-Wound Care Moisturizing lotion - to leg daily Wound Cleansing May shower and wash wound with soap and water. - use Dial antibacterial soap Primary Wound Dressing Wound #2 Right,Anterior Lower Leg Santyl Ointment - use medihoney if santyl unavailable Secondary Dressing Wound #2 Right,Anterior Lower Leg Kerlix/Rolled Gauze Dry Gauze ABD pad Edema Control Avoid standing for long periods of time Elevate legs to the level of the heart or above for 30 minutes daily and/or  when sitting, a frequency of: - throughout the day Exercise regularly Patient Medications llergies: No Known Drug Allergies A Notifications Medication Indication Start End 01/13/2020 Santyl DOSE topical 250 unit/gram ointment - ointment topical Apply nickel thick daily to the wound bed and then cover with a dressing as directed in clinic Electronic Signature(s) Signed: 01/13/2020 9:31:32 AM By: Worthy Keeler PA-C Entered By: Worthy Keeler on 01/13/2020 09:31:31 -------------------------------------------------------------------------------- Problem List Details Patient Name: Date of Service: [name], [name] 01/13/2020 8:45 A M Medical Record Number: [id number] Patient Account Number: [id number] Date of Birth/Sex: Treating RN: 09/20/1965 (54 y.o. [name] Primary Care Provider: Precious Haws Other Clinician: Referring Provider: Treating Provider/Extender: Katheran Awe Weeks in Treatment: 23 Active Problems ICD-10 Encounter Code Description Active Date MDM Diagnosis S80.811D Abrasion, right lower leg, subsequent encounter 08/04/2019 No Yes L97.812 Non-pressure chronic ulcer of other part of right lower leg with fat layer 08/04/2019 No Yes exposed E11.622 Type 2 diabetes mellitus with other skin ulcer 08/04/2019 No Yes I89.0 Lymphedema, not elsewhere classified 08/04/2019 No Yes I87.2 Venous insufficiency (chronic) (peripheral) 09/30/2019 No Yes Inactive Problems Resolved Problems Electronic Signature(s) Signed: 01/13/2020 8:58:59 AM By: Worthy Keeler PA-C Entered By: Worthy Keeler on 01/13/2020 08:58:59 -------------------------------------------------------------------------------- Progress Note Details Patient Name: Date of Service: [name], [name] 01/13/2020 8:45 A M Medical Record Number: [id number] Patient Account Number: [id number] Date of Birth/Sex: Treating RN: 11/21/1965 (54 y.o. [name] Primary Care Provider: Precious Haws Other  Clinician: Referring Provider: Treating Provider/Extender: [name], [name] in Treatment: 23 Subjective Chief Complaint Information obtained from Patient 08/04/2019; patient is here for review of the wound on the right lateral lower leg History of Present Illness (HPI) ADMISSION 08/04/2019 This is a 54 year old man who traumatized his right lateral lower leg while going [place] an escalator at the mall sometime in December 2020. He was referred to vascular surgery and on 04/14/2019 he saw Dr. Wenda Overland. He was felt to have "palpable pulses and normal noninvasive studies" although I cannot see these results. He was back in his primary doctor's office on 07/30/2019 for a nonhealing right lower extremity wound. He was referred to wound care I think in [facility] but his insurance was out of network. A CNS was ordered. X-ray was done that was negative. He has been using wound cleanser and a Band-Aid. Past medical history is actually quite extensive and includes chronic kidney disease stage IV, chronic diastolic heart failure, atrial fibrillation, poorly controlled hypertension, poorly controlled diabetes with peripheral neuropathy and a recent hemoglobin A1c of 11.5, depression, lymphedema and sleep apnea ABI in our clinic was 1.1 in the right 5/6; this wound was initially traumatized while the patient was going down on an escalator. Arrived in clinic last week with a completely nonviable surface. He required mechanical debridement and we have been using Iodoflex under compression. Apparently the compression wraps fell down. He also has poorly controlled diabetes 5/15; traumatic/contusion wound to the right lateral lower leg. We have been using Iodoflex to clean at the surface of this which was completely nonviable along with mechanical debridements. He is under compression. 09/02/2019 upon evaluation today patient appears to be doing well with regard to his wound on his right lateral leg.  He has been tolerating the dressing changes without complication. Fortunately there is no signs of active infection at this time. No fevers, chills, nausea, vomiting, or diarrhea. With that being said there is some necrotic tissue around the edges of the wound that is and require some sharp debridement today. That was discussed with the patient and his mother in the office today prior to debridement to which she did consent. 09/09/2019 upon evaluation today patient appears to be making progress in regard to his wound. Fortunately the wound is not nearly as deep as what it was. It is measuring a little bit larger due to having to clear some of the edges away but again that is necessary to get this to heal. Fortunately there is no evidence of active infection at this time. No fevers, chills, nausea, vomiting, or diarrhea. 09/16/2019 on evaluation today patient appears to be doing excellent in regard to his wound currently. In fact I do not see anything that we can need to debride today which is great news. The wound bed looks healthy and the edges of the wound look like they are doing great. Overall I feel like we are at a good place and headed in the proper direction. 09/23/2019 upon evaluation today patient appears to be doing okay in regard to his wound. This is not measuring any smaller it is about the same. With that being said he tells me that his wrap actually slid down he ended up having to take this off he was on a trip and did not have his Velcro compression with him. With that being said I do not see any signs of anything significantly worsening which is good news. 09/30/2019 upon evaluation today patient appears  to be doing a little bit better in regard to his leg ulcer. He has been tolerating the dressing changes without complication. Fortunately there is no signs of active infection at this time. No fevers, chills, nausea, vomiting, or diarrhea. 10/14/2019 upon evaluation today patient's wound  appears to be healthier. Overall but unfortunately is measuring a little bit bigger. With that being said I do think that we do need to still continue to wrap his leg. Fortunately there is no signs of active infection at this time which is great news. 11/18/2019 upon evaluation today patient actually appears to be doing decently well with regard to his wound. He has been tolerating the dressing changes without complication. Fortunately there is no signs of active infection has been using just Neosporin at this point since he was in the hospital. He did have a venous Doppler study in the hospital though I cannot see the results that was from November 04, 2019. Nonetheless overall I am pleased with the fact the patient does seem to be doing better. 11/25/2019 on evaluation today patient appears to be doing well with regard to his wounds. He has been tolerating the dressing changes without complication. Fortunately there is no signs of active infection at this time. No fevers, chills, nausea, vomiting, or diarrhea. I do believe that he does not appear to be showing any signs of worsening although he also seems to be having some trouble getting this to proceed in a good fashion towards healing. 12/02/2019 on evaluation today patient actually appears to be doing not quite as well in regard to his wounds. Both have some slough noted on the surface of the wound. Unfortunately he is continuing to have some issues here with pain as well in the anterior portion which I think is an issue. Fortunately there is no signs of active infection at this time which is great news. No fevers, chills, nausea, vomiting, or diarrhea. 12/30/2019 upon evaluation today patient unfortunately has not been seen in the past month we are just now seeing him back for reevaluation today last time I saw him was August 25. At that point he had a lateral wound on his leg and an anterior wound which which is starting. The lateral has healed  unfortunately the anterior looks worse. With that being said the patient unfortunately seems to be having some issues today that I cannot completely explain by just the way the wound appears. The wound itself is not really hot to touch and to be honest I would not necessarily assume infection upon initial inspection. With that being said he is acting very lethargic, was weak walking into the clinic according to his mother who is with him today, his blood pressure is actually in a normal range at around 110/73 which is unusual for him he normally runs extremely high in the 884Z for systolic. His blood glucose was 227 here in the clinic today respiratory rate was 18 and his temperature was 97.7. Pulse was right around 70 when I checked this manually. Nonetheless although his vital signs and normal circumstances would be reassuring I am still kind of concerned just because of the way he is acting. I did question him about medications that he has taken he told me that he took 2 Tylenol and tramadol this morning he also tells me he took 2 tramadol last night. With that being said I do not believe that just taking the tramadol is accounting for what is going on as well based on  what I see today. I discussed all this with his mother as well as the patient face-to-face in the office today and subsequently I think I would recommend further evaluation at the ER as I really feel like there is something going on here that I cannot identify just here in the clinic. 01/13/2020 upon evaluation today patient presents for follow-up concerning ongoing issues with his right leg ulcer. After I last saw him 2 weeks ago he was actually sent to the ER where subsequently he ended up being placed on dialysis. He is doing better although he still seems very tired I think still in general he is doing much better. Fortunately there is no signs of active infection at this time. No fevers, chills, nausea, vomiting, or  diarrhea. Patient History Information obtained from Patient. Family History Cancer - Maternal Grandparents,Paternal Grandparents, Diabetes - Mother,Maternal Grandparents, Heart Disease - Maternal Grandparents,Mother,Siblings, Hypertension - Mother,Siblings,Maternal Grandparents, Kidney Disease - Maternal Grandparents, Seizures - Siblings, No family history of Lung Disease, Stroke, Thyroid Problems, Tuberculosis. Social History Never smoker, Marital Status - Married, Alcohol Use - Never, Drug Use - No History, Caffeine Use - Daily - tea. Medical History Eyes Denies history of Cataracts, Glaucoma, Optic Neuritis Ear/Nose/Mouth/Throat Denies history of Chronic sinus problems/congestion, Middle ear problems Hematologic/Lymphatic Patient has history of Lymphedema Denies history of Anemia, Hemophilia, Human Immunodeficiency Virus, Sickle Cell Disease Respiratory Patient has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

## 2020-01-26 ENCOUNTER — Encounter (HOSPITAL_BASED_OUTPATIENT_CLINIC_OR_DEPARTMENT_OTHER): Payer: Medicare Other | Admitting: Internal Medicine

## 2020-01-26 ENCOUNTER — Other Ambulatory Visit (HOSPITAL_BASED_OUTPATIENT_CLINIC_OR_DEPARTMENT_OTHER): Payer: Self-pay | Admitting: Internal Medicine

## 2020-01-26 ENCOUNTER — Other Ambulatory Visit: Payer: Self-pay

## 2020-01-26 DIAGNOSIS — E11622 Type 2 diabetes mellitus with other skin ulcer: Secondary | ICD-10-CM | POA: Diagnosis not present

## 2020-01-26 NOTE — Progress Notes (Signed)
[name], [name] ([id number]) Visit Report for 01/26/2020 Biopsy Details Patient Name: Date of Service: [name], [name] 01/26/2020 8:45 A M Medical Record Number: [id number] Patient Account Number: [id number] Date of Birth/Sex: Treating RN: [birth year] (54 y.o. [name] Primary Care Provider: Precious [name] Other Clinician: Referring Provider: Treating Provider/Extender: [name] in Treatment: 25 Biopsy Performed for: Wound #2 Right, Anterior Lower Leg Location(s): Wound Bed, Wound Margin Performed By: Physician [name]., MD Tissue Punch: Yes Size (mm): 4 Number of Specimens T aken: 2 Level of Consciousness (Pre-procedure): Awake and Alert Pre-procedure Verification/Time-Out Taken: Yes - 09:25 Pain Control: Lidocaine Injectable Lidocaine Percent: 1% Instrument: Forceps, Other: tri-punch biopsy punch Bleeding: Moderate Hemostasis Achieved: Silver Nitrate Procedural Pain: 2 Post Procedural Pain: 3 Response to Treatment: Procedure was tolerated well Level of Consciousness (Post-procedure): Awake and Alert Post Procedure Diagnosis Same as Pre-procedure Electronic Signature(s) Signed: 01/26/2020 4:22:08 PM By: [name] Ham MD Entered By: [name] on 01/26/2020 10:12:37 -------------------------------------------------------------------------------- Debridement Details Patient Name: Date of Service: [name], [name] 01/26/2020 8:45 A M Medical Record Number: [id number] Patient Account Number: [id number] Date of Birth/Sex: Treating RN: 12/25/1965 (54 y.o. [name] Primary Care Provider: Precious [name] Other Clinician: Referring Provider: Treating Provider/Extender: [name] in Treatment: 25 Debridement Performed for Assessment: Wound #2 Right,Anterior Lower Leg Performed By: Clinician [name] Millin, RN Debridement Type: Chemical/Enzymatic/Mechanical Agent Used: Santyl Severity of Tissue Pre  Debridement: Fat layer exposed Level of Consciousness (Pre-procedure): Awake and Alert Pre-procedure Verification/Time Out Yes - 09:30 Taken: Pain Control: Lidocaine 4% Topical Solution Bleeding: None Response to Treatment: Procedure was tolerated well Level of Consciousness (Post- Awake and Alert procedure): Post Debridement Measurements of Total Wound Length: (cm) 15 Width: (cm) 6.2 Depth: (cm) 0.1 Volume: (cm) 7.304 Character of Wound/Ulcer Post Debridement: Requires Further Debridement Severity of Tissue Post Debridement: Fat layer exposed Post Procedure Diagnosis Same as Pre-procedure Electronic Signature(s) Signed: 01/26/2020 4:22:08 PM By: [name] Ham MD Signed: 01/26/2020 5:04:07 PM By: [name] Entered By: [name] on 01/26/2020 09:30:58 -------------------------------------------------------------------------------- HPI Details Patient Name: Date of Service: [name], [name] 01/26/2020 8:45 A M Medical Record Number: [id number] Patient Account Number: [id number] Date of Birth/Sex: Treating RN: 12-03-1965 (54 y.o. [name] Primary Care Provider: Precious [name] Other Clinician: Referring Provider: Treating Provider/Extender: [name] in Treatment: 25 History of Present Illness HPI Description: ADMISSION 08/04/2019 This is a 54 year old man who traumatized his right lateral lower leg while going up an escalator at the mall sometime in December 2020. He was referred to vascular surgery and on 04/14/2019 he saw Dr. Wenda Overland. He was felt to have "palpable pulses and normal noninvasive studies" although I cannot see these results. He was back in his primary doctor's office on 07/30/2019 for a nonhealing right lower extremity wound. He was referred to wound care I think in [facility] but his insurance was out of network. A CNS was ordered. X-ray was done that was negative. He has been using wound cleanser and a Band-Aid. Past medical  history is actually quite extensive and includes chronic kidney disease stage IV, chronic diastolic heart failure, atrial fibrillation, poorly controlled hypertension, poorly controlled diabetes with peripheral neuropathy and a recent hemoglobin A1c of 11.5, depression, lymphedema and sleep apnea ABI in our clinic was 1.1 in the right 5/6; this wound was initially traumatized while the patient was going down on an escalator. Arrived in clinic last week with a completely nonviable  surface. He required mechanical debridement and we have been using Iodoflex under compression. Apparently the compression wraps fell down. He also has poorly controlled diabetes 5/15; traumatic/contusion wound to the right lateral lower leg. We have been using Iodoflex to clean at the surface of this which was completely nonviable along with mechanical debridements. He is under compression. 09/02/2019 upon evaluation today patient appears to be doing well with regard to his wound on his right lateral leg. He has been tolerating the dressing changes without complication. Fortunately there is no signs of active infection at this time. No fevers, chills, nausea, vomiting, or diarrhea. With that being said there is some necrotic tissue around the edges of the wound that is and require some sharp debridement today. That was discussed with the patient and his mother in the office today prior to debridement to which she did consent. 09/09/2019 upon evaluation today patient appears to be making progress in regard to his wound. Fortunately the wound is not nearly as deep as what it was. It is measuring a little bit larger due to having to clear some of the edges away but again that is necessary to get this to heal. Fortunately there is no evidence of active infection at this time. No fevers, chills, nausea, vomiting, or diarrhea. 09/16/2019 on evaluation today patient appears to be doing excellent in regard to his wound currently. In fact  I do not see anything that we can need to debride today which is great news. The wound bed looks healthy and the edges of the wound look like they are doing great. Overall I feel like we are at a good place and headed in the proper direction. 09/23/2019 upon evaluation today patient appears to be doing okay in regard to his wound. This is not measuring any smaller it is about the same. With that being said he tells me that his wrap actually slid down he ended up having to take this off he was on a trip and did not have his Velcro compression with him. With that being said I do not see any signs of anything significantly worsening which is good news. 09/30/2019 upon evaluation today patient appears to be doing a little bit better in regard to his leg ulcer. He has been tolerating the dressing changes without complication. Fortunately there is no signs of active infection at this time. No fevers, chills, nausea, vomiting, or diarrhea. 10/14/2019 upon evaluation today patient's wound appears to be healthier. Overall but unfortunately is measuring a little bit bigger. With that being said I do think that we do need to still continue to wrap his leg. Fortunately there is no signs of active infection at this time which is great news. 11/18/2019 upon evaluation today patient actually appears to be doing decently well with regard to his wound. He has been tolerating the dressing changes without complication. Fortunately there is no signs of active infection has been using just Neosporin at this point since he was in the hospital. He did have a venous Doppler study in the hospital though I cannot see the results that was from November 04, 2019. Nonetheless overall I am pleased with the fact the patient does seem to be doing better. 11/25/2019 on evaluation today patient appears to be doing well with regard to his wounds. He has been tolerating the dressing changes without complication. Fortunately there is no signs of  active infection at this time. No fevers, chills, nausea, vomiting, or diarrhea. I do believe that he  does not appear to be showing any signs of worsening although he also seems to be having some trouble getting this to proceed in a good fashion towards healing. 12/02/2019 on evaluation today patient actually appears to be doing not quite as well in regard to his wounds. Both have some slough noted on the surface of the wound. Unfortunately he is continuing to have some issues here with pain as well in the anterior portion which I think is an issue. Fortunately there is no signs of active infection at this time which is great news. No fevers, chills, nausea, vomiting, or diarrhea. 12/30/2019 upon evaluation today patient unfortunately has not been seen in the past month we are just now seeing him back for reevaluation today last time I saw him was August 25. At that point he had a lateral wound on his leg and an anterior wound which which is starting. The lateral has healed unfortunately the anterior looks worse. With that being said the patient unfortunately seems to be having some issues today that I cannot completely explain by just the way the wound appears. The wound itself is not really hot to touch and to be honest I would not necessarily assume infection upon initial inspection. With that being said he is acting very lethargic, was weak walking into the clinic according to his mother who is with him today, his blood pressure is actually in a normal range at around 110/73 which is unusual for him he normally runs extremely high in the 637C for systolic. His blood glucose was 227 here in the clinic today respiratory rate was 18 and his temperature was 97.7. Pulse was right around 70 when I checked this manually. Nonetheless although his vital signs and normal circumstances would be reassuring I am still kind of concerned just because of the way he is acting. I did question him about medications that  he has taken he told me that he took 2 Tylenol and tramadol this morning he also tells me he took 2 tramadol last night. With that being said I do not believe that just taking the tramadol is accounting for what is going on as well based on what I see today. I discussed all this with his mother as well as the patient face-to-face in the office today and subsequently I think I would recommend further evaluation at the ER as I really feel like there is something going on here that I cannot identify just here in the clinic. 01/13/2020 upon evaluation today patient presents for follow-up concerning ongoing issues with his right leg ulcer. After I last saw him 2 weeks ago he was actually sent to the ER where subsequently he ended up being placed on dialysis. He is doing better although he still seems very tired I think still in general he is doing much better. Fortunately there is no signs of active infection at this time. No fevers, chills, nausea, vomiting, or diarrhea. 01/26/2020; this is a patient with a large irregular albeit superficial wound on the right anterior lower leg. He has been using Santyl. He has recently been initiated on dialysis after we sent him to the ER earlier this month. He describes the wound is being very painful. By his description this started as a small raised area that rapidly expanded. He does not have a lot of obvious venous issues. Looking back through the records this was described as initially traumatic in April. He has seen vascular surgery and not felt to have an  arterial issue Electronic Signature(s) Signed: 01/26/2020 4:22:08 PM By: [name] Ham MD Entered By: [name] on 01/26/2020 10:21:43 -------------------------------------------------------------------------------- Physical Exam Details Patient Name: Date of Service: [name], [name] 01/26/2020 8:45 A M Medical Record Number: [id number] Patient Account Number: [id number] Date of Birth/Sex: Treating  RN: 04-15-1965 (54 y.o. [name] Primary Care Provider: Precious [name] Other Clinician: Referring Provider: Treating Provider/Extender: Norina Buzzard, Lemar Lofty in Treatment: 25 Constitutional Sitting or standing Blood Pressure is within target range for patient.. Pulse regular and within target range for patient.Marland Kitchen Respirations regular, non-labored and within target range.. Temperature is normal and within the target range for the patient.Marland Kitchen Appears in no distress. Cardiovascular Pedal pulses are palpable. 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 Notes Exam; large irregular wound on the anterior mid tibia. This does not have a lot of depth but is almost 100% covered in nonviable slough. There is some inflammation around the wound very painful. It does not look overtly infected Electronic Signature(s) Signed: 01/26/2020 4:22:08 PM By: [name] Ham MD Entered By: [name] on 01/26/2020 10:23:02 -------------------------------------------------------------------------------- Physician Orders Details Patient Name: Date of Service: [name], [name] 01/26/2020 8:45 A M Medical Record Number: [id number] Patient Account Number: [id number] Date of Birth/Sex: Treating RN: [birth year] (54 y.o. 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 - Wednesday Return A Dressing Change Frequency Wound #2 Right,Anterior Lower  Leg Change dressing every day. Skin Barriers/Peri-Wound Care Moisturizing lotion - to leg daily Wound Cleansing May shower and wash wound with soap and water. - use Dial antibacterial soap Primary Wound Dressing Wound #2 Right,Anterior Lower Leg Santyl Ointment - use medihoney if santyl unavailable Secondary Dressing Wound #2 Right,Anterior Lower Leg Kerlix/Rolled Gauze Dry Gauze ABD pad Edema Control Avoid standing for long periods of time Elevate legs to the level of the heart or above for 30 minutes daily and/or when sitting, a frequency of: - throughout the day Exercise regularly Laboratory Bacteria identified in Tissue by Biopsy culture (MICRO) - biopsy of right leg x2 areas -margin of wounds and center of wound bed. LOINC Code: [id number] Convenience Name: Biopsy specimen culture Electronic Signature(s) Signed: 01/26/2020 4:22:08 PM By: [name] Ham MD Signed: 01/26/2020 5:04:07 PM By: [name] Entered By: [name] on 01/26/2020 09:32:08 -------------------------------------------------------------------------------- Problem List Details Patient Name: Date of Service: [name], [name] 01/26/2020 8:45 A M Medical Record Number: [id number] Patient Account Number: [id number] Date of Birth/Sex: Treating RN: 07-07-1965 (54 y.o. [name] Primary Care Provider: Precious [name] Other Clinician: Referring Provider: Treating Provider/Extender: Norina Buzzard, Lemar Lofty in Treatment: 25 Active Problems ICD-10 Encounter Code Description Active Date MDM Diagnosis S80.811D Abrasion, right lower leg, subsequent encounter 08/04/2019 No Yes L97.812 Non-pressure chronic ulcer of other part of right lower leg with fat layer 08/04/2019 No Yes exposed E11.622 Type 2 diabetes mellitus with other skin ulcer 08/04/2019 No Yes I89.0 Lymphedema, not elsewhere classified 08/04/2019 No Yes I87.2 Venous insufficiency (chronic) (peripheral) 09/30/2019 No Yes Inactive  Problems Resolved Problems Electronic Signature(s) Signed: 01/26/2020 4:22:08 PM By: Dellia Nims,  Legrand Como MD Entered By: [name] on 01/26/2020 10:12:17 -------------------------------------------------------------------------------- Progress Note Details Patient Name: Date of Service: [name], [name] 01/26/2020 8:45 A M Medical Record Number: [id number] Patient Account Number: [id number] Date of Birth/Sex: Treating RN: 1965-09-10 (54 y.o. [name] Primary Care Provider: Precious [name] Other Clinician: Referring Provider: Treating Provider/Extender: Norina Buzzard, Lemar Lofty in Treatment: 25 Subjective History of Present Illness (HPI) ADMISSION 08/04/2019 This is a 54 year old man who traumatized his right lateral lower leg while going [place] an escalator at the mall sometime in December 2020. He was referred to vascular surgery and on 04/14/2019 he saw Dr. Wenda Overland. He was felt to have "palpable pulses and normal noninvasive studies" although I cannot see these results. He was back in his primary doctor's office on 07/30/2019 for a nonhealing right lower extremity wound. He was referred to wound care I think in [facility] but his insurance was out of network. A CNS was ordered. X-ray was done that was negative. He has been using wound cleanser and a Band-Aid. Past medical history is actually quite extensive and includes chronic kidney disease stage IV, chronic diastolic heart failure, atrial fibrillation, poorly controlled hypertension, poorly controlled diabetes with peripheral neuropathy and a recent hemoglobin A1c of 11.5, depression, lymphedema and sleep apnea ABI in our clinic was 1.1 in the right 5/6; this wound was initially traumatized while the patient was going down on an escalator. Arrived in clinic last week with a completely nonviable surface. He required mechanical debridement and we have been using Iodoflex under compression. Apparently the compression wraps fell  down. He also has poorly controlled diabetes 5/15; traumatic/contusion wound to the right lateral lower leg. We have been using Iodoflex to clean at the surface of this which was completely nonviable along with mechanical debridements. He is under compression. 09/02/2019 upon evaluation today patient appears to be doing well with regard to his wound on his right lateral leg. He has been tolerating the dressing changes without complication. Fortunately there is no signs of active infection at this time. No fevers, chills, nausea, vomiting, or diarrhea. With that being said there is some necrotic tissue around the edges of the wound that is and require some sharp debridement today. That was discussed with the patient and his mother in the office today prior to debridement to which she did consent. 09/09/2019 upon evaluation today patient appears to be making progress in regard to his wound. Fortunately the wound is not nearly as deep as what it was. It is measuring a little bit larger due to having to clear some of the edges away but again that is necessary to get this to heal. Fortunately there is no evidence of active infection at this time. No fevers, chills, nausea, vomiting, or diarrhea. 09/16/2019 on evaluation today patient appears to be doing excellent in regard to his wound currently. In fact I do not see anything that we can need to debride today which is great news. The wound bed looks healthy and the edges of the wound look like they are doing great. Overall I feel like we are at a good place and headed in the proper direction. 09/23/2019 upon evaluation today patient appears to be doing okay in regard to his wound. This is not measuring any smaller it is about the same. With that being said he tells me that his wrap actually slid down he ended up having to take this off he was on a trip and did not have  his Velcro compression with him. With that being said I do not see any signs of anything  significantly worsening which is good news. 09/30/2019 upon evaluation today patient appears to be doing a little bit better in regard to his leg ulcer. He has been tolerating the dressing changes without complication. Fortunately there is no signs of active infection at this time. No fevers, chills, nausea, vomiting, or diarrhea. 10/14/2019 upon evaluation today patient's wound appears to be healthier. Overall but unfortunately is measuring a little bit bigger. With that being said I do think that we do need to still continue to wrap his leg. Fortunately there is no signs of active infection at this time which is great news. 11/18/2019 upon evaluation today patient actually appears to be doing decently well with regard to his wound. He has been tolerating the dressing changes without complication. Fortunately there is no signs of active infection has been using just Neosporin at this point since he was in the hospital. He did have a venous Doppler study in the hospital though I cannot see the results that was from November 04, 2019. Nonetheless overall I am pleased with the fact the patient does seem to be doing better. 11/25/2019 on evaluation today patient appears to be doing well with regard to his wounds. He has been tolerating the dressing changes without complication. Fortunately there is no signs of active infection at this time. No fevers, chills, nausea, vomiting, or diarrhea. I do believe that he does not appear to be showing any signs of worsening although he also seems to be having some trouble getting this to proceed in a good fashion towards healing. 12/02/2019 on evaluation today patient actually appears to be doing not quite as well in regard to his wounds. Both have some slough noted on the surface of the wound. Unfortunately he is continuing to have some issues here with pain as well in the anterior portion which I think is an issue. Fortunately there is no signs of active infection at this  time which is great news. No fevers, chills, nausea, vomiting, or diarrhea. 12/30/2019 upon evaluation today patient unfortunately has not been seen in the past month we are just now seeing him back for reevaluation today last time I saw him was August 25. At that point he had a lateral wound on his leg and an anterior wound which which is starting. The lateral has healed unfortunately the anterior looks worse. With that being said the patient unfortunately seems to be having some issues today that I cannot completely explain by just the way the wound appears. The wound itself is not really hot to touch and to be honest I would not necessarily assume infection upon initial inspection. With that being said he is acting very lethargic, was weak walking into the clinic according to his mother who is with him today, his blood pressure is actually in a normal range at around 110/73 which is unusual for him he normally runs extremely high in the 570V for systolic. His blood glucose was 227 here in the clinic today respiratory rate was 18 and his temperature was 97.7. Pulse was right around 70 when I checked this manually. Nonetheless although his vital signs and normal circumstances would be reassuring I am still kind of concerned just because of the way he is acting. I did question him about medications that he has taken he told me that he took 2 Tylenol and tramadol this morning he also tells me he  took 2 tramadol last night. With that being said I do not believe that just taking the tramadol is accounting for what is going on as well based on what I see today. I discussed all this with his mother as well as the patient face-to-face in the office today and subsequently I think I would recommend further evaluation at the ER as I really feel like there is something going on here that I cannot identify just here in the clinic. 01/13/2020 upon evaluation today patient presents for follow-up concerning ongoing  issues with his right leg ulcer. After I last saw him 2 weeks ago he was actually sent to the ER where subsequently he ended up being placed on dialysis. He is doing better although he still seems very tired I think still in general he is doing much better. Fortunately there is no signs of active infection at this time. No fevers, chills, nausea, vomiting, or diarrhea. 01/26/2020; this is a patient with a large irregular albeit superficial wound on the right anterior lower leg. He has been using Santyl. He has recently been initiated on dialysis after we sent him to the ER earlier this month. He describes the wound is being very painful. By his description this started as a small raised area that rapidly expanded. He does not have a lot of obvious venous issues. Looking back through the records this was described as initially traumatic in April. He has seen vascular surgery and not felt to have an arterial issue Objective Constitutional Sitting or standing Blood Pressure is within target range for patient.. Pulse regular and within target range for patient.Marland Kitchen Respirations regular, non-labored and within target range.. Temperature is normal and within the target range for the patient.Marland Kitchen Appears in no distress. Vitals Time Taken: 8:41 AM, Height: 66 in, Source: Stated, Weight: 256 lbs, Source: Stated, BMI: 41.3, Temperature: 98.1 F, Pulse: 82 bpm, Respiratory Rate: 18 breaths/min, Blood Pressure: 131/85 mmHg, Capillary Blood Glucose: 129 mg/dl. General Notes: glucose per pt report this am Cardiovascular Pedal pulses are palpable. 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 General Notes: Exam; large irregular wound on the anterior mid tibia. This does not have a lot of depth but is almost 100% covered in nonviable slough. There is some inflammation around the wound very painful. It does not look overtly infected Integumentary (Hair, Skin) Wound #2 status is Open. Original cause of  wound was Trauma. The wound is located on the Right,Anterior Lower Leg. The wound measures 15cm length x 6.2cm width x 0.1cm depth; 73.042cm^2 area and 7.304cm^3 volume. There is Fat Layer (Subcutaneous Tissue) exposed. There is no tunneling or undermining noted. There is a medium amount of serosanguineous drainage noted. The wound margin is flat and intact. There is medium (34-66%) granulation within the wound bed. There is a medium (34-66%) amount of necrotic tissue within the wound bed including Adherent Slough. 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 There was a Chemical/Enzymatic/Mechanical debridement performed by [name] Millin, RN. after achieving pain control using Lidocaine 4% Topical Solution. Agent used was [employer]. A time out was conducted at 09:30, prior to the start  of the procedure. There was no bleeding. The procedure was tolerated well. Post Debridement Measurements: 15cm length x 6.2cm width x 0.1cm depth; 7.304cm^3 volume. Character of Wound/Ulcer Post Debridement requires further debridement. Severity of Tissue Post Debridement is: Fat layer exposed. Post procedure Diagnosis Wound #2: Same as Pre-Procedure Pre-procedure diagnosis of Wound #2 is a Venous Leg Ulcer located on the Right, Anterior Lower Leg . There was a biopsy performed by [name]., MD. There was a biopsy performed on Wound Bed, Wound Margin. The skin was cleansed and prepped with anti-septic followed by pain control using Lidocaine Injectable: 1%. Utilizing a 4 mm tissue  punch, tissue was removed at its base with the following instrument(s): Forceps and Other. A Moderate amount of bleeding was controlled with Silver Nitrate. A time out was conducted at 09:25, prior to the start of the procedure. The procedure was tolerated well with a pain level of 2 throughout and a pain level of 3 following the procedure. Post procedure Diagnosis Wound #2: Same as Pre-Procedure Plan Follow-up Appointments: Return Appointment in 1 week. - Wednesday Dressing Change Frequency: Wound #2 Right,Anterior Lower Leg: Change dressing every day. Skin Barriers/Peri-Wound Care: Moisturizing lotion - to leg daily Wound Cleansing: May shower and wash wound with soap and water. - use Dial antibacterial soap Primary Wound Dressing: Wound #2 Right,Anterior Lower Leg: Santyl Ointment - use medihoney if santyl unavailable Secondary Dressing: Wound #2 Right,Anterior Lower Leg: Kerlix/Rolled Gauze Dry Gauze ABD pad Edema Control: Avoid standing for long periods of time Elevate legs to the level of the heart or above for 30 minutes daily and/or when sitting, a frequency of: - throughout the day Exercise regularly Laboratory ordered were: Biopsy specimen culture - biopsy of right leg x2 areas -margin of wounds and center of wound bed. 1. Initially traumatic wound on the right anterior lower extremity. This recently rapidly expanded and is become exquisitely painful. 2. Because of the uncertainty of what is because this I went ahead and did a punch biopsy at the margin and the interior of the wound. I doubt this is calciphylaxis as it is not really compatible with a history but that is the major rule out, also inflammatory ulcer etc. 3. If this is negative consider a deep tissue culture 4. I did not change the Santyl-based dressing. 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 [name] Primary Care Provider: Precious [name] Other Clinician: Referring Provider: Treating Provider/Extender: Norina Buzzard, Lemar Lofty in Treatment: 25 Diagnosis Coding ICD-10 Codes Code Description [id number] Abrasion, right lower leg, subsequent encounter L97.812 Non-pressure chronic ulcer of other part of right lower leg with fat layer exposed E11.622 Type 2 diabetes mellitus with other skin ulcer I89.0 Lymphedema, not elsewhere classified I87.2 Venous insufficiency (chronic) (peripheral) Facility Procedures CPT4 Code: [id number] Description: 11104-Punch biopsy of skin (including simple closure, when performed) single lesion ICD-10 Diagnosis Description L97.812 Non-pressure chronic ulcer of other part of right lower leg with fat layer exposed Modifier: Quantity: 2 Physician Procedures : CPT4 Code Description Modifier 11104 Punch biopsy of skin (including simple closure, when performed) single lesion ICD-10 Diagnosis Description L97.812 Non-pressure chronic ulcer of other part of right lower leg with fat layer exposed Quantity: 2 Electronic Signature(s) Signed:  01/26/2020 4:22:08 PM By: [name] Ham MD Entered By: [name] on 01/26/2020 10:24:37

## 2020-01-26 NOTE — Progress Notes (Signed)
[name], [name] ([id number]) Visit Report for 01/26/2020 Arrival Information Details Patient Name: Date of Service: [name], [name] 01/26/2020 8:45 A M Medical Record Number: [id number] Patient Account Number: [id number] Date of Birth/Sex: Treating RN: [birth year] (54 y.o. [name] Primary Care Yaniah Thiemann: Precious Haws Other Clinician: Referring Jeremiah Curci: Treating Liesel Peckenpaugh/Extender: [name] in Treatment: 25 Visit Information History Since Last Visit Added or deleted any medications: No Patient Arrived: Gilford Rile Any new allergies or adverse reactions: No Arrival Time: 08:38 Had a fall or experienced change in No Accompanied By: mother activities of daily living that may affect Transfer Assistance: None risk of falls: Patient Identification Verified: Yes Signs or symptoms of abuse/neglect since last visito No Secondary Verification Process Completed: Yes Hospitalized since last visit: No Patient Requires Transmission-Based Precautions: No Implantable device outside of the clinic excluding No Patient Has Alerts: No cellular tissue based products placed in the center since last visit: Has Dressing in Place as Prescribed: Yes Pain Present Now: Yes Electronic Signature(s) Signed: 01/26/2020 4:56:50 PM By: Baruch Gouty RN, BSN Entered By: Baruch Gouty on 01/26/2020 08:41:37 -------------------------------------------------------------------------------- Encounter Discharge Information Details Patient Name: Date of Service: [name], [name] 01/26/2020 8:45 A M Medical Record Number: [id number] Patient Account Number: [id number] Date of Birth/Sex: Treating RN: [birth year] (53 y.o. [name] Primary Care Leslea Vowles: Precious Haws Other Clinician: Referring Lennard Capek: Treating Gaylord Seydel/Extender: [name] in Treatment: 25 Encounter Discharge Information Items Post Procedure Vitals Discharge Condition: Stable Temperature  (F): 98.1 Ambulatory Status: Walker Pulse (bpm): 82 Discharge Destination: Home Respiratory Rate (breaths/min): 18 Transportation: Private Auto Blood Pressure (mmHg): 131/85 Accompanied By: mother Schedule Follow-up Appointment: Yes Clinical Summary of Care: Patient Declined Electronic Signature(s) Signed: 01/26/2020 11:49:38 AM By: Kela Millin Entered By: Kela Millin on 01/26/2020 09:50:21 -------------------------------------------------------------------------------- Lower Extremity Assessment Details Patient Name: Date of Service: [name], [name] 01/26/2020 8:45 A M Medical Record Number: [id number] Patient Account Number: [id number] Date of Birth/Sex: Treating RN: 1965/04/30 (54 y.o. [name] Primary Care Mical Brun: Precious Haws Other Clinician: Referring Arno Cullers: Treating Tagen Milby/Extender: Norina Buzzard, Lynelle Smoke Weeks in Treatment: 25 Edema Assessment Assessed: [Left: No] [Right: No] Edema: [Left: Ye] [Right: s] Calf Left: Right: Point of Measurement: 31 cm From Medial Instep 39 cm Ankle Left: Right: Point of Measurement: 8 cm From Medial Instep 23.3 cm Vascular Assessment Pulses: Dorsalis Pedis Palpable: [Right:Yes] Electronic Signature(s) Signed: 01/26/2020 4:56:50 PM By: Baruch Gouty RN, BSN Entered By: Baruch Gouty on 01/26/2020 08:49:14 -------------------------------------------------------------------------------- Multi Wound Chart Details Patient Name: Date of Service: [name], [name] 01/26/2020 8:45 A M Medical Record Number: [id number] Patient Account Number: [id number] Date of Birth/Sex: Treating RN: 1965/06/08 (54 y.o. [name] Primary Care Akashdeep Chuba: Precious Haws Other Clinician: Referring Rahmon Heigl: Treating Ingram Onnen/Extender: Norina Buzzard, Lemar Lofty in Treatment: 25 Vital Signs Height(in): 66 Capillary Blood Glucose(mg/dl): 129 Weight(lbs): 256 Pulse(bpm): 65 Body Mass Index(BMI):  41 Blood Pressure(mmHg): 131/85 Temperature(F): 98.1 Respiratory Rate(breaths/min): 18 Photos: [2:No Photos Right, Anterior Lower Leg] [N/A:N/A N/A] Wound Location: [2:Trauma] [N/A:N/A] Wounding Event: [2:Venous Leg Ulcer] [N/A:N/A] Primary Etiology: [2:Lymphedema, Sleep Apnea,] [N/A:N/A] Comorbid History: [2:Arrhythmia, Congestive Heart Failure, Hypertension, Peripheral Arterial Disease, Type II Diabetes 11/30/2019] [N/A:N/A] Date Acquired: [2:7] [N/A:N/A] Weeks of Treatment: [2:Open] [N/A:N/A] Wound Status: [2:15x6.2x0.1] [N/A:N/A] Measurements L x W x D (cm) [2:73.042] [N/A:N/A] A (cm) : rea [0:9.470] [N/A:N/A] Volume (cm) : [2:-1283.90%] [N/A:N/A] % Reduction in Area: [2:-1283.30%] [N/A:N/A] % Reduction in Volume: [2:Full Thickness Without Exposed] [N/A:N/A] Classification: [2:Support Structures  Medium] [N/A:N/A] Exudate A mount: [2:Serosanguineous] [N/A:N/A] Exudate Type: [2:red, brown] [N/A:N/A] Exudate Color: [2:Flat and Intact] [N/A:N/A] Wound Margin: [2:Medium (34-66%)] [N/A:N/A] Granulation A mount: [2:Medium (34-66%)] [N/A:N/A] Necrotic A mount: [2:Fat Layer (Subcutaneous Tissue): Yes N/A] Exposed Structures: [2:Fascia: No Tendon: No Muscle: No Joint: No Bone: No Small (1-33%)] [N/A:N/A] Epithelialization: [2:Chemical/Enzymatic/Mechanical] [N/A:N/A] Debridement: Pre-procedure Verification/Time Out 09:30 [N/A:N/A] Taken: [2:Lidocaine 4% Topical Solution] [N/A:N/A] Pain Control: [2:N/A] [N/A:N/A] Instrument: [2:None] [N/A:N/A] Bleeding: [2:Procedure was tolerated well] [N/A:N/A] Debridement Treatment Response: [2:15x6.2x0.1] [N/A:N/A] Post Debridement Measurements L x W x D (cm) [2:7.304] [N/A:N/A] Post Debridement Volume: (cm) [2:Biopsy] [N/A:N/A] Procedures Performed: [2:Debridement] Treatment Notes Wound #2 (Right, Anterior Lower Leg) 1. Cleanse With Wound Cleanser 3. Primary Dressing Applied Santyl 4. Secondary Dressing ABD Pad Dry Gauze Roll  Gauze 5. Secured With Tape Notes lightly moistened saline gauze over santyl. netting Electronic Signature(s) Signed: 01/26/2020 4:22:08 PM By: Linton Ham MD Signed: 01/26/2020 5:04:07 PM By: Deon Pilling Entered By: Linton Ham on 01/26/2020 10:12:25 -------------------------------------------------------------------------------- Multi-Disciplinary Care Plan Details Patient Name: Date of Service: [name], [name] 01/26/2020 8:45 A M Medical Record Number: [id number] Patient Account Number: [id number] Date of Birth/Sex: Treating RN: [birth year] (54 y.o. [name] Primary Care Floride Hutmacher: Precious Haws Other Clinician: Referring Loden Laurent: Treating Rochella Benner/Extender: [name] in Treatment: 25 Active Inactive Venous Leg Ulcer Nursing Diagnoses: Knowledge deficit related to disease process and management Potential for venous Insuffiency (use before diagnosis confirmed) Goals: Patient will maintain optimal edema control Date Initiated: 09/09/2019 Target Resolution Date: 03/03/2020 Goal Status: Active Patient/caregiver will verbalize understanding of disease process and disease management Date Initiated: 09/09/2019 Date Inactivated: 10/14/2019 Target Resolution Date: 10/07/2019 Goal Status: Met Interventions: Assess peripheral edema status every visit. Compression as ordered Provide education on venous insufficiency Treatment Activities: Therapeutic compression applied : 09/09/2019 Notes: Wound/Skin Impairment Nursing Diagnoses: Knowledge deficit related to ulceration/compromised skin integrity Goals: Patient/caregiver will verbalize understanding of skin care regimen Date Initiated: 08/04/2019 Target Resolution Date: 02/25/2020 Goal Status: Active Ulcer/skin breakdown will have a volume reduction of 30% by week 4 Date Initiated: 08/04/2019 Date Inactivated: 09/09/2019 Target Resolution Date: 09/11/2019 Goal Status: Met Ulcer/skin breakdown will  have a volume reduction of 50% by week 8 Date Initiated: 09/09/2019 Date Inactivated: 10/14/2019 Target Resolution Date: 10/07/2019 Goal Status: Unmet Unmet Reason: awaiting reflux studies Ulcer/skin breakdown will have a volume reduction of 80% by week 12 Date Initiated: 10/14/2019 Date Inactivated: 11/18/2019 Target Resolution Date: 11/04/2019 Goal Status: Unmet Unmet Reason: uncontrolled htn Interventions: Assess patient/caregiver ability to obtain necessary supplies Assess patient/caregiver ability to perform ulcer/skin care regimen upon admission and as needed Assess ulceration(s) every visit Notes: Electronic Signature(s) Signed: 01/26/2020 5:04:07 PM By: Deon Pilling Entered By: Deon Pilling on 01/26/2020 08:51:02 -------------------------------------------------------------------------------- Pain Assessment Details Patient Name: Date of Service: [name], [name] 01/26/2020 8:45 A M Medical Record Number: [id number] Patient Account Number: [id number] Date of Birth/Sex: Treating RN: 10-14-1965 (54 y.o. [name] Primary Care Rozalyn Osland: Precious Haws Other Clinician: Referring Adriel Desrosier: Treating Isidoro Santillana/Extender: [name] in Treatment: 25 Active Problems Location of Pain Severity and Description of Pain Patient Has Paino Yes Patient Has Paino Yes Site Locations Pain Location: Pain in Ulcers With Dressing Change: Yes Duration of the Pain. Constant / Intermittento Constant Rate the pain. Current Pain Level: 8 Worst Pain Level: 10 Least Pain Level: 8 Character of Pain Describe the Pain: Aching, Throbbing Pain Management and Medication Current Pain Management: Medication: Yes Is the Current Pain Management Adequate: Inadequate  How does your wound impact your activities of daily livingo Sleep: Yes Bathing: No Appetite: No Relationship With Others: No Bladder Continence: No Emotions: Yes Bowel Continence: No Drive: Yes Toileting:  No Hobbies: Yes Dressing: No Electronic Signature(s) Signed: 01/26/2020 4:56:50 PM By: Baruch Gouty RN, BSN Entered By: Baruch Gouty on 01/26/2020 08:45:18 -------------------------------------------------------------------------------- Patient/Caregiver Education Details Patient Name: Date of Service: [name], [name] 10/19/2021andnbsp8:45 A M Medical Record Number: [id number] Patient Account Number: [id number] Date of Birth/Gender: Treating RN: 10/27/1965 (54 y.o. [name] Primary Care Physician: Precious Haws Other Clinician: Referring Physician: Treating Physician/Extender: [name] in Treatment: 25 Education Assessment Education Provided To: Patient Education Topics Provided Venous: Handouts: Managing Venous Disease and Related Ulcers Methods: Explain/Verbal Responses: Reinforcements needed Electronic Signature(s) Signed: 01/26/2020 5:04:07 PM By: Deon Pilling Entered By: Deon Pilling on 01/26/2020 08:51:16 -------------------------------------------------------------------------------- Wound Assessment Details Patient Name: Date of Service: [name], [name] 01/26/2020 8:45 A M Medical Record Number: [id number] Patient Account Number: [id number] Date of Birth/Sex: Treating RN: 01-Feb-1966 (54 y.o. [name] Primary Care Anuj Summons: Precious Haws Other Clinician: Referring Tyjai Matuszak: Treating Cassondra Stachowski/Extender: Norina Buzzard, Lemar Lofty in Treatment: 25 Wound Status Wound Number: 2 Primary Venous Leg Ulcer Etiology: Wound Location: Right, Anterior Lower Leg Wound Open Wounding Event: Trauma Status: Date Acquired: 11/30/2019 Comorbid Lymphedema, Sleep Apnea, Arrhythmia, Congestive Heart Failure, Weeks Of Treatment: 7 History: Hypertension, Peripheral Arterial Disease, Type II Diabetes Clustered Wound: No Wound Measurements Length: (cm) 15 Width: (cm) 6.2 Depth: (cm) 0.1 Area: (cm) 73.042 Volume: (cm) 7.304 %  Reduction in Area: -1283.9% % Reduction in Volume: -1283.3% Epithelialization: Small (1-33%) Tunneling: No Undermining: No Wound Description Classification: Full Thickness Without Exposed Support Structures Wound Margin: Flat and Intact Exudate Amount: Medium Exudate Type: Serosanguineous Exudate Color: red, brown Foul Odor After Cleansing: No Slough/Fibrino Yes Wound Bed Granulation Amount: Medium (34-66%) Exposed Structure Necrotic Amount: Medium (34-66%) Fascia Exposed: No Necrotic Quality: Adherent Slough Fat Layer (Subcutaneous Tissue) Exposed: Yes Tendon Exposed: No Muscle Exposed: No Joint Exposed: No Bone Exposed: No Treatment Notes Wound #2 (Right, Anterior Lower Leg) 1. Cleanse With Wound Cleanser 3. Primary Dressing Applied Santyl 4. Secondary Dressing ABD Pad Dry Gauze Roll Gauze 5. Secured With Tape Notes lightly moistened saline gauze over santyl. netting Electronic Signature(s) Signed: 01/26/2020 4:56:50 PM By: Baruch Gouty RN, BSN Entered By: Baruch Gouty on 01/26/2020 08:50:33 -------------------------------------------------------------------------------- Rowe Details Patient Name: Date of Service: [name], [name] 01/26/2020 8:45 A M Medical Record Number: [id number] Patient Account Number: [id number] Date of Birth/Sex: Treating RN: Jan 24, 1966 (54 y.o. [name] Primary Care Paz Winsett: Precious Haws Other Clinician: Referring Paden Senger: Treating Kassondra Geil/Extender: [name] in Treatment: 25 Vital Signs Time Taken: 08:41 Temperature (F): 98.1 Height (in): 66 Pulse (bpm): 82 Source: Stated Respiratory Rate (breaths/min): 18 Weight (lbs): 256 Blood Pressure (mmHg): 131/85 Source: Stated Capillary Blood Glucose (mg/dl): 129 Body Mass Index (BMI): 41.3 Reference Range: 80 - 120 mg / dl Notes glucose per pt report this am Electronic Signature(s) Signed: 01/26/2020 4:56:50 PM By: Baruch Gouty RN,  BSN Entered By: Baruch Gouty on 01/26/2020 08:44:16

## 2020-02-02 ENCOUNTER — Other Ambulatory Visit: Payer: Self-pay

## 2020-02-02 ENCOUNTER — Ambulatory Visit (INDEPENDENT_AMBULATORY_CARE_PROVIDER_SITE_OTHER): Payer: Medicare Other | Admitting: Cardiology

## 2020-02-02 VITALS — BP 102/72 | HR 74 | Ht 67.0 in | Wt 227.0 lb

## 2020-02-02 DIAGNOSIS — Z7901 Long term (current) use of anticoagulants: Secondary | ICD-10-CM

## 2020-02-02 DIAGNOSIS — I10 Essential (primary) hypertension: Secondary | ICD-10-CM | POA: Diagnosis not present

## 2020-02-02 DIAGNOSIS — N186 End stage renal disease: Secondary | ICD-10-CM

## 2020-02-02 DIAGNOSIS — E118 Type 2 diabetes mellitus with unspecified complications: Secondary | ICD-10-CM

## 2020-02-02 DIAGNOSIS — N185 Chronic kidney disease, stage 5: Secondary | ICD-10-CM

## 2020-02-02 DIAGNOSIS — Z7189 Other specified counseling: Secondary | ICD-10-CM

## 2020-02-02 DIAGNOSIS — I480 Paroxysmal atrial fibrillation: Secondary | ICD-10-CM | POA: Diagnosis not present

## 2020-02-02 DIAGNOSIS — I517 Cardiomegaly: Secondary | ICD-10-CM | POA: Insufficient documentation

## 2020-02-02 NOTE — Progress Notes (Signed)
Cardiology Office Note:    Date:  02/02/2020   ID:  [name], DOB [birth year], MRN [id number]  PCP:  Bartholome Bill, MD  Cardiologist:  Buford Dresser, MD  Referring MD: Bartholome Bill, MD   CC: new patient consultation for atrial fibrillation  History of Present Illness:    [name] is a 54 y.o. male with a hx of ESRD on dialysis, paroxysmal atrial fibrillation, type II diabetes, hypertension, OSA on CPAP, chronic diastolic heart failure, chronic lower extremity wound who is seen as a new consult at the request of Bartholome Bill, MD for the evaluation and management of atrial fibrillation.  No other concerns today. Goes to dialysis M/W/F. Tolerates well. No low blood pressures.   He does not recall a history of atrial fibrillation. Was on aspirin for a time, thinks he has been on apixaban for about six months. Thinks he was only recently told about afib. On review of Care Everywhere, appears he had a hospitalization at [facility] 11/01/18 where he was diagnosed with atrial fibrillation with RVR.   No issues with melena/hematochezia. No issues with bleeding during dialysis. Follows with Dr. Posey Pronto at [facility].  Thinks he has had a heart attack while in [place] in 1998. No procedures done. Just remembers feeling like he couldn't breathe, EMS told him he was near death.  Denies chest pain, shortness of breath at rest or with normal exertion. No PND, orthopnea, LE edema or unexpected weight gain. No syncope or palpitations.  Past Medical History:  Diagnosis Date  . Acute kidney injury ([place]) 12/28/2014  . Adjustment disorder with mixed anxiety and depressed mood 12/28/2014  . CPAP (continuous positive airway pressure) dependence   . Diabetes mellitus without complication ([place])   . DM (diabetes mellitus) type II controlled with renal manifestation ([place]) 12/28/2014  . DM (diabetes mellitus) type II controlled, neurological manifestation ([place]) 12/28/2014  .  Encephalopathy, hypertensive 12/28/2014  . Hypertension   . Hypertensive emergency without congestive heart failure 12/27/2014  . Possible Panic disorder 12/28/2014  . Renal disorder   . 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 Sleep apnea     Past Surgical History:  Procedure Laterality Date  . AV FISTULA PLACEMENT Left 01/05/2020   Procedure: LEFT ARM BRACHIOCEPHALIC ARTERIOVENOUS (AV) FISTULA;  Surgeon: Angelia Mould, MD;  Location: [place];  Service: Vascular;  Laterality: Left;  . EMPYEMA DRAINAGE Right 05/17/2017   Procedure: EMPYEMA DRAINAGE AND DECORTICATION;  Surgeon: Grace Isaac, MD;  Location: [place];  Service: Thoracic;  Laterality: Right;  . FLEXIBLE BRONCHOSCOPY  05/17/2017   Procedure: FLEXIBLE BRONCHOSCOPY;  Surgeon: Grace Isaac, MD;  Location: [place];  Service: Thoracic;;  . IR FLUORO GUIDE CV LINE RIGHT  01/01/2020  . IR THORACENTESIS ASP PLEURAL SPACE W/IMG GUIDE  05/17/2017  . IR US GUIDE VASC ACCESS RIGHT  01/01/2020  . NO PAST SURGERIES    . THORACOTOMY/LOBECTOMY Right 05/17/2017   Procedure: RIGHT MINI THORACOTOMY;  Surgeon: Grace Isaac, MD;  Location: [place];  Service: Thoracic;  Laterality: Right;  Marland Kitchen VIDEO ASSISTED THORACOSCOPY (VATS)/EMPYEMA Right 05/17/2017   Procedure: RIGHT VIDEO ASSISTED THORACOSCOPY (VATS)/EMPYEMA;  Surgeon: Grace Isaac, MD;  Location: [facility] OR;  Service: Thoracic;  Laterality: Right;    Current Medications: Current Outpatient Medications on File Prior to Visit  Medication Sig  . Accu-Chek Softclix Lancets lancets Use as directed.  For insulin-dependent type 2 diabetes Monitor  glucose 3 times a day  . acetaminophen (TYLENOL) 500  MG tablet Take 1,000-1,500 mg by mouth 2 (two) times daily as needed (pain).  Marland Kitchen albuterol (PROVENTIL HFA;VENTOLIN HFA) 108 (90 BASE) MCG/ACT inhaler Inhale 2 puffs into the lungs every 6 (six) hours as needed for wheezing or shortness of breath.   Marland Kitchen amLODipine (NORVASC) 10 MG tablet Take 1 tablet  (10 mg total) by mouth daily.  Marland Kitchen apixaban (ELIQUIS) 5 MG TABS tablet Take 5 mg by mouth 2 (two) times daily.  . calcitRIOL (ROCALTROL) 0.25 MCG capsule Take 1 capsule (0.25 mcg total) by mouth every Monday, Wednesday, and Friday.  . carvedilol (COREG) 25 MG tablet Take 1 tablet (25 mg total) by mouth 2 (two) times daily with a meal.  . fluticasone (FLONASE) 50 MCG/ACT nasal spray Place 1 spray into both nostrils 2 (two) times daily.  Marland Kitchen gabapentin (NEURONTIN) 300 MG capsule Take 1 capsule (300 mg total) by mouth daily.  Marland Kitchen glucose blood test strip Use as instructed For insulin-dependent type 2 diabetes Monitor  glucose 3 times a day  . HYDROcodone-acetaminophen (NORCO) 7.5-325 MG tablet Take 1 tablet by mouth every 4 (four) hours as needed.  . insulin NPH-regular Human (70-30) 100 UNIT/ML injection Inject 25 Units into the skin 2 (two) times daily before a meal.   . levocetirizine (XYZAL) 5 MG tablet Take 5 mg by mouth at bedtime.  . multivitamin (RENA-VIT) TABS tablet Take 1 tablet by mouth at bedtime.  . sevelamer carbonate (RENVELA) 800 MG tablet Take 1 tablet (800 mg total) by mouth 3 (three) times daily with meals.  Marland Kitchen tiZANidine (ZANAFLEX) 4 MG tablet Take 4 mg by mouth every 8 (eight) hours as needed for muscle spasms.   . traMADol (ULTRAM) 50 MG tablet Take 50 mg by mouth 3 (three) times daily as needed (pain).   . Vitamin D, Ergocalciferol, (DRISDOL) 1.25 MG (50000 UNIT) CAPS capsule Take 50,000 Units by mouth every Sunday.   No current facility-administered medications on file prior to visit.     Allergies:   Patient has no known allergies.   Social History   Tobacco Use  . Smoking status: Never Smoker  . Smokeless tobacco: Never Used  Vaping Use  . Vaping Use: Never used  Substance Use Topics  . Alcohol use: No  . Drug use: No    Family History: family history includes Hypertension in his mother.  ROS:   Please see the history of present illness.  Additional pertinent  ROS: Constitutional: Negative for chills, fever, night sweats, unintentional weight loss  HENT: Negative for ear pain and hearing loss.   Eyes: Negative for loss of vision and eye pain.  Respiratory: Negative for cough, sputum, wheezing.   Cardiovascular: See HPI. Gastrointestinal: Negative for abdominal pain, melena, and hematochezia.  Genitourinary: Negative for dysuria and hematuria.  Musculoskeletal: Negative for falls and myalgias.  Skin: Negative for itching and rash.  Neurological: Negative for focal weakness, focal sensory changes and loss of consciousness.  Endo/Heme/Allergies: Does not bruise/bleed easily.     EKGs/Labs/Other Studies Reviewed:    The following studies were reviewed today: Echo 05/28/2017 Left ventricle: The cavity size was normal. Wall thickness was  increased in a pattern of moderate LVH. Systolic function was  normal. The estimated ejection fraction was in the range of 60%  to 65%. Wall motion was normal; there were no regional wall  motion abnormalities. Doppler parameters are consistent with  abnormal left ventricular relaxation (grade 1 diastolic  dysfunction).  - Left atrium: The atrium was moderately dilated.  -  Atrial septum: No defect or patent foramen ovale was identified.   EKG:  EKG is personally reviewed.  The ekg ordered today demonstrates NSR at 74 bpm  Recent Labs: 10/25/2019: B Natriuretic Peptide 100.2 10/26/2019: Magnesium 2.3 01/07/2020: BUN 29; Creatinine, Ser 5.25; Hemoglobin 9.5; Platelets 450; Potassium 3.7; Sodium 136  Recent Lipid Panel No results found for: CHOL, TRIG, HDL, CHOLHDL, VLDL, LDLCALC, LDLDIRECT  Physical Exam:    VS:  BP 102/72   Pulse 74   Ht _0  (1.702 m)   Wt 227 lb (103 kg)   SpO2 98%   BMI 35.55 kg/m     Wt Readings from Last 3 Encounters:  02/02/20 227 lb (103 kg)  01/07/20 228 lb 9.9 oz (103.7 kg)  10/27/19 243 lb 11.2 oz (110.5 kg)    GEN: Well nourished, well developed in no acute  distress HEENT: Normal, moist mucous membranes NECK: No JVD CARDIAC: regular rhythm, normal S1 and S2, no rubs or gallops. No murmurs. VASCULAR: Radial and DP pulses 2+ bilaterally. No carotid bruits. LUE AVF with +thrill/bruit RESPIRATORY:  Clear to auscultation without rales, wheezing or rhonchi  ABDOMEN: Soft, non-tender, non-distended MUSCULOSKELETAL:  Ambulates independently SKIN: Warm and dry, no edema. R tibial wound dressed. NEUROLOGIC:  Alert and oriented x 3. No focal neuro deficits noted. PSYCHIATRIC:  Normal affect    ASSESSMENT:    1. AF (paroxysmal atrial fibrillation) ([place])   2. Essential hypertension   3. ESRD (end stage renal disease) ([place])   4. Chronic anticoagulation   5. Type 2 diabetes mellitus with complication, without long-term current use of insulin (H[facility])   6. Cardiac risk counseling   7. Counseling on health promotion and disease prevention    PLAN:    Paroxysmal atrial fibrillation: -CHA2DS2/VAS Stroke Risk Points=3  -discussed that Afib is abnormal rhythm of the top chamber of the heart. It can be triggered by many different things, including stress, infection, surgery, age, etc. The rhythm itself, when not fast, is not life threatening. We make sure the heart rate is not fast, and we talk about the risk of small blood clots forming in the heart. When afib lasts for >48 hours, there is a risk of small clots forming and breaking loose, causing a stroke. We prevent this with blood thinning medication, but these medications also increase risk for bleeding. We discuss the balance between bleeding and stroke with each individual patient. -denies active bleeding. Continue apixaban -continue carvedilol  ESRD, on dialysis: -denies hypotension or chest pain with dialysis  Chronic diastolic heart failure -volume management per dialysis  Hypertension -at goal, denies lows -continue amlodipine, carvedilol, hydralazine  Type II diabetes: -on dialysis, no SGLT2i  or GLP1RA -last A1c 7.5 -data for statins as primary prevention in ESRD is equivocal at best. We discussed briefly today. No aspirin given apixaban  Cardiac risk counseling and prevention recommendations: -recommend heart healthy/Mediterranean diet, with whole grains, fruits, vegetable, fish, lean meats, nuts, and olive oil. Limit salt. -recommend moderate walking, 3-5 times/week for 30-50 minutes each session. Aim for at least 150 minutes.week. Goal should be pace of 3 miles/hours, or walking 1.5 miles in 30 minutes -recommend avoidance of tobacco products. Avoid excess alcohol. -ASCVD risk score: The 10-year ASCVD risk score Mikey Bussing DC Brooke Bonito., et al., 2013) is: 13.4%   Values used to calculate the score:     Age: 89 years     Sex: Male     Is Non-Hispanic African American: Yes  Diabetic: Yes     Tobacco smoker: No     Systolic Blood Pressure: 564 mmHg     Is BP treated: Yes     HDL Cholesterol: 36 MG/DL     Total Cholesterol: 196 MG/DL    Plan for follow up: 1 year or sooner as needed  Buford Dresser, MD, PhD [place]  [facility]    Medication Adjustments/Labs and Tests Ordered: Current medicines are reviewed at length with the patient today.  Concerns regarding medicines are outlined above.  Orders Placed This Encounter  Procedures  . EKG 12-Lead   No orders of the defined types were placed in this encounter.   Patient Instructions  Medication Instructions:  Your Physician recommend you continue on your current medication as directed.    *If you need a refill on your cardiac medications before your next appointment, please call your pharmacy*   Lab Work: None ordered   Testing/Procedures: None ordered    Follow-Up: At [facility], you and your health needs are our priority.  As part of our continuing mission to provide you with exceptional heart care, we have created designated Provider Care Teams.  These Care Teams include your primary  Cardiologist (physician) and Advanced Practice Providers (APPs -  Physician Assistants and Nurse Practitioners) who all work together to provide you with the care you need, when you need it.  We recommend signing up for the patient portal called "MyChart".  Sign up information is provided on this After Visit Summary.  MyChart is used to connect with patients for Virtual Visits (Telemedicine).  Patients are able to view lab/test results, encounter notes, upcoming appointments, etc.  Non-urgent messages can be sent to your provider as well.   To learn more about what you can do with MyChart, go to [email or web address].    Your next appointment:   1 year(s)  The format for your next appointment:   In Person  Provider:   Buford Dresser, MD      Signed, Buford Dresser, MD PhD 02/02/2020     [place]

## 2020-02-02 NOTE — Patient Instructions (Signed)
Medication Instructions:  Your Physician recommend you continue on your current medication as directed.    *If you need a refill on your cardiac medications before your next appointment, please call your pharmacy*   Lab Work: None ordered   Testing/Procedures: None ordered    Follow-Up: At [facility], you and your health needs are our priority.  As part of our continuing mission to provide you with exceptional heart care, we have created designated Provider Care Teams.  These Care Teams include your primary Cardiologist (physician) and Advanced Practice Providers (APPs -  Physician Assistants and Nurse Practitioners) who all work together to provide you with the care you need, when you need it.  We recommend signing up for the patient portal called "MyChart".  Sign up information is provided on this After Visit Summary.  MyChart is used to connect with patients for Virtual Visits (Telemedicine).  Patients are able to view lab/test results, encounter notes, upcoming appointments, etc.  Non-urgent messages can be sent to your provider as well.   To learn more about what you can do with MyChart, go to [email or web address].    Your next appointment:   1 year(s)  The format for your next appointment:   In Person  Provider:   Bridgette Christopher, MD     

## 2020-02-03 ENCOUNTER — Other Ambulatory Visit (HOSPITAL_COMMUNITY): Payer: Medicare Other

## 2020-02-03 ENCOUNTER — Encounter: Payer: Medicare Other | Admitting: Vascular Surgery

## 2020-02-03 ENCOUNTER — Encounter (HOSPITAL_BASED_OUTPATIENT_CLINIC_OR_DEPARTMENT_OTHER): Payer: Medicare Other | Admitting: Physician Assistant

## 2020-02-03 ENCOUNTER — Encounter (HOSPITAL_COMMUNITY): Payer: Medicare Other

## 2020-02-03 DIAGNOSIS — E11622 Type 2 diabetes mellitus with other skin ulcer: Secondary | ICD-10-CM | POA: Diagnosis not present

## 2020-02-03 NOTE — Progress Notes (Signed)
Likes, BRANDONN ([id number]) Visit Report for 02/03/2020 Chief Complaint Document Details Patient Name: Date of Service: [name], [name] 02/03/2020 8:00 A M Medical Record Number: [id number] Patient Account Number: [id number] Date of Birth/Sex: Treating RN: 08-26-1965 (54 y.o. [name] Primary Care Provider: Precious Haws Other Clinician: Referring Provider: Treating Provider/Extender: [name], [name] in Treatment: 26 Information Obtained from: Patient Chief Complaint 08/04/2019; patient is here for review of the wound on the right lateral lower leg Electronic Signature(s) Signed: 02/03/2020 8:12:04 AM By: Worthy Keeler PA-C Entered By: Worthy Keeler on 02/03/2020 08:12:04 -------------------------------------------------------------------------------- Debridement Details Patient Name: Date of Service: [name], [name] 02/03/2020 8:00 A M Medical Record Number: [id number] Patient Account Number: [id number] Date of Birth/Sex: Treating RN: 02/23/1966 (54 y.o. [name] Primary Care Provider: Precious Haws Other Clinician: Referring Provider: Treating Provider/Extender: Katheran Awe Weeks in Treatment: 26 Debridement Performed for Assessment: Wound #2 Right,Anterior Lower Leg Performed By: Clinician Deon Pilling, RN Debridement Type: Chemical/Enzymatic/Mechanical Agent Used: Santyl Severity of Tissue Pre Debridement: Fat layer exposed Level of Consciousness (Pre-procedure): Awake and Alert Pre-procedure Verification/Time Out Yes - 08:55 Taken: Pain Control: Lidocaine 4% Topical Solution Bleeding: None Response to Treatment: Procedure was tolerated well Level of Consciousness (Post- Awake and Alert procedure): Post Debridement Measurements of Total Wound Length: (cm) 16.4 Width: (cm) 7.5 Depth: (cm) 0.1 Volume: (cm) 9.66 Character of Wound/Ulcer Post Debridement: Improved Severity of Tissue Post Debridement: Fat layer  exposed Post Procedure Diagnosis Same as Pre-procedure Electronic Signature(s) Signed: 02/03/2020 4:47:33 PM By: Worthy Keeler PA-C Signed: 02/03/2020 5:36:04 PM By: Baruch Gouty RN, BSN Signed: 02/03/2020 5:36:04 PM By: Baruch Gouty RN, BSN Entered By: Baruch Gouty on 02/03/2020 08:57:34 -------------------------------------------------------------------------------- HPI Details Patient Name: Date of Service: [name], [name] 02/03/2020 8:00 A M Medical Record Number: [id number] Patient Account Number: [id number] Date of Birth/Sex: Treating RN: [birth year] (54 y.o. [name] Primary Care Provider: Precious Haws Other Clinician: Referring Provider: Treating Provider/Extender: [name], [name] in Treatment: 26 History of Present Illness HPI Description: ADMISSION 08/04/2019 This is a 54 year old man who traumatized his right lateral lower leg while going up an escalator at the mall sometime in December 2020. He was referred to vascular surgery and on 04/14/2019 he saw Dr. Wenda Overland. He was felt to have "palpable pulses and normal noninvasive studies" although I cannot see these results. He was back in his primary doctor's office on 07/30/2019 for a nonhealing right lower extremity wound. He was referred to wound care I think in [facility] but his insurance was out of network. A CNS was ordered. X-ray was done that was negative. He has been using wound cleanser and a Band-Aid. Past medical history is actually quite extensive and includes chronic kidney disease stage IV, chronic diastolic heart failure, atrial fibrillation, poorly controlled hypertension, poorly controlled diabetes with peripheral neuropathy and a recent hemoglobin A1c of 11.5, depression, lymphedema and sleep apnea ABI in our clinic was 1.1 in the right 5/6; this wound was initially traumatized while the patient was going down on an escalator. Arrived in clinic last week with a completely  nonviable surface. He required mechanical debridement and we have been using Iodoflex under compression. Apparently the compression wraps fell down. He also has poorly controlled diabetes 5/15; traumatic/contusion wound to the right lateral lower leg. We have been using Iodoflex to clean at the surface of this which was completely nonviable along with mechanical debridements.  He is under compression. 09/02/2019 upon evaluation today patient appears to be doing well with regard to his wound on his right lateral leg. He has been tolerating the dressing changes without complication. Fortunately there is no signs of active infection at this time. No fevers, chills, nausea, vomiting, or diarrhea. With that being said there is some necrotic tissue around the edges of the wound that is and require some sharp debridement today. That was discussed with the patient and his mother in the office today prior to debridement to which she did consent. 09/09/2019 upon evaluation today patient appears to be making progress in regard to his wound. Fortunately the wound is not nearly as deep as what it was. It is measuring a little bit larger due to having to clear some of the edges away but again that is necessary to get this to heal. Fortunately there is no evidence of active infection at this time. No fevers, chills, nausea, vomiting, or diarrhea. 09/16/2019 on evaluation today patient appears to be doing excellent in regard to his wound currently. In fact I do not see anything that we can need to debride today which is great news. The wound bed looks healthy and the edges of the wound look like they are doing great. Overall I feel like we are at a good place and headed in the proper direction. 09/23/2019 upon evaluation today patient appears to be doing okay in regard to his wound. This is not measuring any smaller it is about the same. With that being said he tells me that his wrap actually slid down he ended up having  to take this off he was on a trip and did not have his Velcro compression with him. With that being said I do not see any signs of anything significantly worsening which is good news. 09/30/2019 upon evaluation today patient appears to be doing a little bit better in regard to his leg ulcer. He has been tolerating the dressing changes without complication. Fortunately there is no signs of active infection at this time. No fevers, chills, nausea, vomiting, or diarrhea. 10/14/2019 upon evaluation today patient's wound appears to be healthier. Overall but unfortunately is measuring a little bit bigger. With that being said I do think that we do need to still continue to wrap his leg. Fortunately there is no signs of active infection at this time which is great news. 11/18/2019 upon evaluation today patient actually appears to be doing decently well with regard to his wound. He has been tolerating the dressing changes without complication. Fortunately there is no signs of active infection has been using just Neosporin at this point since he was in the hospital. He did have a venous Doppler study in the hospital though I cannot see the results that was from November 04, 2019. Nonetheless overall I am pleased with the fact the patient does seem to be doing better. 11/25/2019 on evaluation today patient appears to be doing well with regard to his wounds. He has been tolerating the dressing changes without complication. Fortunately there is no signs of active infection at this time. No fevers, chills, nausea, vomiting, or diarrhea. I do believe that he does not appear to be showing any signs of worsening although he also seems to be having some trouble getting this to proceed in a good fashion towards healing. 12/02/2019 on evaluation today patient actually appears to be doing not quite as well in regard to his wounds. Both have some slough noted on the  surface of the wound. Unfortunately he is continuing to have some  issues here with pain as well in the anterior portion which I think is an issue. Fortunately there is no signs of active infection at this time which is great news. No fevers, chills, nausea, vomiting, or diarrhea. 12/30/2019 upon evaluation today patient unfortunately has not been seen in the past month we are just now seeing him back for reevaluation today last time I saw him was August 25. At that point he had a lateral wound on his leg and an anterior wound which which is starting. The lateral has healed unfortunately the anterior looks worse. With that being said the patient unfortunately seems to be having some issues today that I cannot completely explain by just the way the wound appears. The wound itself is not really hot to touch and to be honest I would not necessarily assume infection upon initial inspection. With that being said he is acting very lethargic, was weak walking into the clinic according to his mother who is with him today, his blood pressure is actually in a normal range at around 110/73 which is unusual for him he normally runs extremely high in the 854O for systolic. His blood glucose was 227 here in the clinic today respiratory rate was 18 and his temperature was 97.7. Pulse was right around 70 when I checked this manually. Nonetheless although his vital signs and normal circumstances would be reassuring I am still kind of concerned just because of the way he is acting. I did question him about medications that he has taken he told me that he took 2 Tylenol and tramadol this morning he also tells me he took 2 tramadol last night. With that being said I do not believe that just taking the tramadol is accounting for what is going on as well based on what I see today. I discussed all this with his mother as well as the patient face-to-face in the office today and subsequently I think I would recommend further evaluation at the ER as I really feel like there is something going on  here that I cannot identify just here in the clinic. 01/13/2020 upon evaluation today patient presents for follow-up concerning ongoing issues with his right leg ulcer. After I last saw him 2 weeks ago he was actually sent to the ER where subsequently he ended up being placed on dialysis. He is doing better although he still seems very tired I think still in general he is doing much better. Fortunately there is no signs of active infection at this time. No fevers, chills, nausea, vomiting, or diarrhea. 01/26/2020; this is a patient with a large irregular albeit superficial wound on the right anterior lower leg. He has been using Santyl. He has recently been initiated on dialysis after we sent him to the ER earlier this month. He describes the wound is being very painful. By his description this started as a small raised area that rapidly expanded. He does not have a lot of obvious venous issues. Looking back through the records this was described as initially traumatic in April. He has seen vascular surgery and not felt to have an arterial issue 02/03/2020 upon evaluation today patient actually appears to be doing a little better in regard to his ulcer although it has spread inferiorly the central portion of the wound and towards the upper has actually new skin growth. Fortunately there is no signs of active infection at this time which is great  news. 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 With that being said some of the more significant issues here such as angiosarcoma does not appear to be likely based on pathology report. It seems that were more in the  arterial or possible calciphylaxis realm based on what I am seeing visually. Electronic Signature(s) Signed: 02/03/2020 9:02:53 AM By: Worthy Keeler PA-C Entered By: Worthy Keeler on 02/03/2020 09:02:53 -------------------------------------------------------------------------------- Physical Exam Details Patient Name: Date of Service: [name], [name] 02/03/2020 8:00 A M Medical Record Number: [id number] Patient Account Number: [id number] Date of Birth/Sex: Treating RN: 03/18/1966 (54 y.o. [name] Primary Care Provider: Precious Haws Other Clinician: Referring Provider: Treating Provider/Extender: [name], [name] in Treatment: 83 Constitutional Well-nourished and well-hydrated in no acute distress. Respiratory normal breathing without difficulty. Psychiatric this patient is able to make decisions and demonstrates good insight into disease process. Alert and Oriented x 3. pleasant and cooperative. Notes Upon inspection patient's wound actually showed signs of improvement which is great news it does not appear to be any signs of active infection which is also great news no fevers, chills, nausea, vomiting, or diarrhea. He actually appears to be showing some signs of healing with new skin growth in the central to upper portion of the wound it seems to be expanding in the lower portion of the wound I am hoping the prednisone will help in this regard. Electronic Signature(s) Signed: 02/03/2020 9:03:22 AM By: Worthy Keeler PA-C Entered By: Worthy Keeler on 02/03/2020 09:03:21 -------------------------------------------------------------------------------- Physician Orders Details Patient Name: Date of Service: [name], [name] 02/03/2020 8:00 A M Medical Record Number: [id number] Patient Account Number: [id number] Date of Birth/Sex: Treating RN: 1965-06-30 (54 y.o. [name] Primary Care Provider: Precious Haws Other Clinician: Referring  Provider: Treating Provider/Extender: Katheran Awe Weeks in Treatment: 26 Verbal / Phone Orders: No Diagnosis Coding ICD-10 Coding Code Description S80.811D Abrasion, right lower leg, subsequent encounter L97.812 Non-pressure chronic ulcer of other part of right lower leg with fat layer exposed E11.622 Type 2 diabetes mellitus with other skin ulcer I89.0 Lymphedema, not elsewhere classified I87.2 Venous insufficiency (chronic) (peripheral) Follow-up Appointments ppointment in 1 week. - Wednesday Return A Dressing Change Frequency Wound #2 Right,Anterior Lower Leg Change dressing every day. Skin Barriers/Peri-Wound Care Moisturizing lotion - to leg daily Wound Cleansing May shower and wash wound with soap and water. - use Dial antibacterial soap Primary Wound Dressing Wound #2 Right,Anterior Lower Leg Santyl Ointment - use medihoney if santyl unavailable Secondary Dressing Wound #2 Right,Anterior Lower Leg Kerlix/Rolled Gauze Dry Gauze ABD pad Edema Control Avoid standing for long periods of time Elevate legs to the level of the heart or above for 30 minutes daily and/or when sitting, a frequency of: - throughout the day Exercise regularly Services and Therapies rterial Studies- Bilateral with ABI and TBIs - non healing ulcer right anterior lower leg, vasculitic changes per punch biopsy - (ICD10 L97.812 - Non- A pressure chronic ulcer of other part of right lower leg with fat layer exposed) Patient Medications llergies: No Known Drug Allergies A Notifications Medication Indication Start End 02/03/2020 prednisone DOSE 1 - oral 50 mg tablet - 1 tablet oral taken 1  time per day for 7 days Electronic Signature(s) Signed: 02/03/2020 9:06:28 AM By: Worthy Keeler PA-C Entered By: Worthy Keeler on 02/03/2020 09:06:27 Prescription 02/03/2020 -------------------------------------------------------------------------------- Mcclusky, Levora Angel  PA Patient Name: Provider: Jun 07, 1965 [id number] Date of Birth: NPI#: [name] [id number] Sex: DEA #: [phone] Phone #: License #: [place] Patient Address: [place] [address] [place], [place] [address] [place], [place] [address] [phone] Allergies No Known Drug Allergies Provider's Orders rterial Studies- Bilateral with ABI and TBIs - ICD10: L97.812 - non healing ulcer right anterior lower leg, vasculitic changes per punch biopsy A Hand Signature: Date(s): Electronic Signature(s) Signed: 02/03/2020 4:47:33 PM By: Worthy Keeler PA-C Entered By: Worthy Keeler on 02/03/2020 09:06:28 -------------------------------------------------------------------------------- Problem List Details Patient Name: Date of Service: [name], [name] 02/03/2020 8:00 A M Medical Record Number: [id number] Patient Account Number: [id number] Date of Birth/Sex: Treating RN: Nov 27, 1965 (54 y.o. [name] Primary Care Provider: Precious Haws Other Clinician: Referring Provider: Treating Provider/Extender: Katheran Awe Weeks in Treatment: 26 Active Problems ICD-10 Encounter Code Description Active Date MDM Diagnosis S80.811D Abrasion, right lower leg, subsequent encounter 08/04/2019 No Yes L97.812 Non-pressure chronic ulcer of other part of right lower leg with fat layer 08/04/2019 No Yes exposed E11.622 Type 2 diabetes mellitus with other skin ulcer 08/04/2019 No Yes I89.0 Lymphedema, not elsewhere classified 08/04/2019 No Yes I87.2 Venous insufficiency (chronic) (peripheral) 09/30/2019 No Yes Inactive Problems Resolved Problems Electronic Signature(s) Signed: 02/03/2020 8:12:00 AM By: Worthy Keeler PA-C Entered By: Worthy Keeler on 02/03/2020 08:11:59 -------------------------------------------------------------------------------- Progress Note Details Patient Name: Date of Service: [name], [name]  02/03/2020 8:00 A M Medical Record Number: [id number] Patient Account Number: [id number] Date of Birth/Sex: Treating RN: 28-Jan-1966 (54 y.o. [name] Primary Care Provider: Precious Haws Other Clinician: Referring Provider: Treating Provider/Extender: [name], [name] in Treatment: 26 Subjective Chief Complaint Information obtained from Patient 08/04/2019; patient is here for review of the wound on the right lateral lower leg History of Present Illness (HPI) ADMISSION 08/04/2019 This is a 54 year old man who traumatized his right lateral lower leg while going [place] an escalator at the mall sometime in December 2020. He was referred to vascular surgery and on 04/14/2019 he saw Dr. Wenda Overland. He was felt to have "palpable pulses and normal noninvasive studies" although I cannot see these results. He was back in his primary doctor's office on 07/30/2019 for a nonhealing right lower extremity wound. He was referred to wound care I think in [facility] but his insurance was out of network. A CNS was ordered. X-ray was done that was negative. He has been using wound cleanser and a Band-Aid. Past medical history is actually quite extensive and includes chronic kidney disease stage IV, chronic diastolic heart failure, atrial fibrillation, poorly controlled hypertension, poorly controlled diabetes with peripheral neuropathy and a recent hemoglobin A1c of 11.5, depression, lymphedema and sleep apnea ABI in our clinic was 1.1 in the right 5/6; this wound was initially traumatized while the patient was going down on an escalator. Arrived in clinic last week with a completely nonviable surface. He required mechanical debridement and we have been using Iodoflex under compression. Apparently the compression wraps fell down. He also has poorly controlled diabetes 5/15; traumatic/contusion wound to the right lateral lower leg. We have been using Iodoflex to clean at the surface of this which  was completely nonviable along  with mechanical debridements. He is under compression. 09/02/2019 upon evaluation today patient appears to be doing well with regard to his wound on his right lateral leg. He has been tolerating the dressing changes without complication. Fortunately there is no signs of active infection at this time. No fevers, chills, nausea, vomiting, or diarrhea. With that being said there is some necrotic tissue around the edges of the wound that is and require some sharp debridement today. That was discussed with the patient and his mother in the office today prior to debridement to which she did consent. 09/09/2019 upon evaluation today patient appears to be making progress in regard to his wound. Fortunately the wound is not nearly as deep as what it was. It is measuring a little bit larger due to having to clear some of the edges away but again that is necessary to get this to heal. Fortunately there is no evidence of active infection at this time. No fevers, chills, nausea, vomiting, or diarrhea. 09/16/2019 on evaluation today patient appears to be doing excellent in regard to his wound currently. In fact I do not see anything that we can need to debride today which is great news. The wound bed looks healthy and the edges of the wound look like they are doing great. Overall I feel like we are at a good place and headed in the proper direction. 09/23/2019 upon evaluation today patient appears to be doing okay in regard to his wound. This is not measuring any smaller it is about the same. With that being said he tells me that his wrap actually slid down he ended up having to take this off he was on a trip and did not have his Velcro compression with him. With that being said I do not see any signs of anything significantly worsening which is good news. 09/30/2019 upon evaluation today patient appears to be doing a little bit better in regard to his leg ulcer. He has been tolerating the  dressing changes without complication. Fortunately there is no signs of active infection at this time. No fevers, chills, nausea, vomiting, or diarrhea. 10/14/2019 upon evaluation today patient's wound appears to be healthier. Overall but unfortunately is measuring a little bit bigger. With that being said I do think that we do need to still continue to wrap his leg. Fortunately there is no signs of active infection at this time which is great news. 11/18/2019 upon evaluation today patient actually appears to be doing decently well with regard to his wound. He has been tolerating the dressing changes without complication. Fortunately there is no signs of active infection has been using just Neosporin at this point since he was in the hospital. He did have a venous Doppler study in the hospital though I cannot see the results that was from November 04, 2019. Nonetheless overall I am pleased with the fact the patient does seem to be doing better. 11/25/2019 on evaluation today patient appears to be doing well with regard to his wounds. He has been tolerating the dressing changes without complication. Fortunately there is no signs of active infection at this time. No fevers, chills, nausea, vomiting, or diarrhea. I do believe that he does not appear to be showing any signs of worsening although he also seems to be having some trouble getting this to proceed in a good fashion towards healing. 12/02/2019 on evaluation today patient actually appears to be doing not quite as well in regard to his wounds. Both have some slough  noted on the surface of the wound. Unfortunately he is continuing to have some issues here with pain as well in the anterior portion which I think is an issue. Fortunately there is no signs of active infection at this time which is great news. No fevers, chills, nausea, vomiting, or diarrhea. 12/30/2019 upon evaluation today patient unfortunately has not been seen in the past month we are just now  seeing him back for reevaluation today last time I saw him was August 25. At that point he had a lateral wound on his leg and an anterior wound which which is starting. The lateral has healed unfortunately the anterior looks worse. With that being said the patient unfortunately seems to be having some issues today that I cannot completely explain by just the way the wound appears. The wound itself is not really hot to touch and to be honest I would not necessarily assume infection upon initial inspection. With that being said he is acting very lethargic, was weak walking into the clinic according to his mother who is with him today, his blood pressure is actually in a normal range at around 110/73 which is unusual for him he normally runs extremely high in the 591M for systolic. His blood glucose was 227 here in the clinic today respiratory rate was 18 and his temperature was 97.7. Pulse was right around 70 when I checked this manually. Nonetheless although his vital signs and normal circumstances would be reassuring I am still kind of concerned just because of the way he is acting. I did question him about medications that he has taken he told me that he took 2 Tylenol and tramadol this morning he also tells me he took 2 tramadol last night. With that being said I do not believe that just taking the tramadol is accounting for what is going on as well based on what I see today. I discussed all this with his mother as well as the patient face-to-face in the office today and subsequently I think I would recommend further evaluation at the ER as I really feel like there is something going on here that I cannot identify just here in the clinic. 01/13/2020 upon evaluation today patient presents for follow-up concerning ongoing issues with his right leg ulcer. After I last saw him 2 weeks ago he was actually sent to the ER where subsequently he ended up being placed on dialysis. He is doing better although he  still seems very tired I think still in general he is doing much better. Fortunately there is no signs of active infection at this time. No fevers, chills, nausea, vomiting, or diarrhea. 01/26/2020; this is a patient with a large irregular albeit superficial wound on the right anterior lower leg. He has been using Santyl. He has recently been initiated on dialysis after we sent him to the ER earlier this month. He describes the wound is being very painful. By his description this started as a small raised area that rapidly expanded. He does not have a lot of obvious venous issues. Looking back through the records this was described as initially traumatic in April. He has seen vascular surgery and not felt to have an arterial issue 02/03/2020 upon evaluation today patient actually appears to be doing a little better in regard to his ulcer although it has spread inferiorly the central portion of the wound and towards the upper has actually new skin growth. Fortunately there is no signs of active infection at this time  which is great news. 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 With that being said some of the more significant issues here such as angiosarcoma does not appear to be likely based on pathology report. It seems that were more in the arterial or possible calciphylaxis realm based on what I am seeing visually. Objective Constitutional Well-nourished and well-hydrated in no acute distress. Vitals Time Taken: 8:11 AM, Weight: 256 lbs, Temperature: 97.8 F, Pulse: 83 bpm, Respiratory Rate: 18 breaths/min, Blood Pressure: 147/87 mmHg, Capillary Blood  Glucose: 118 mg/dl. Respiratory normal breathing without difficulty. Psychiatric this patient is able to make decisions and demonstrates good insight into disease process. Alert and Oriented x 3. pleasant and cooperative. General Notes: Upon inspection patient's wound actually showed signs of improvement which is great news it does not appear to be any signs of active infection which is also great news no fevers, chills, nausea, vomiting, or diarrhea. He actually appears to be showing some signs of healing with new skin growth in the central to upper portion of the wound it seems to be expanding in the lower portion of the wound I am hoping the prednisone will help in this regard. Integumentary (Hair, Skin) Wound #2 status is Open. Original cause of wound was Trauma. The wound is located on the Right,Anterior Lower Leg. The wound measures 16.4cm length x 7.5cm width x 0.1cm depth; 96.604cm^2 area and 9.66cm^3 volume. There is Fat Layer (Subcutaneous Tissue) exposed. There is no tunneling or undermining noted. There is a medium amount of serosanguineous drainage noted. The wound margin is flat and intact. There is large (67-100%) granulation within the wound bed. There is a small (1-33%) amount of necrotic tissue within the wound bed including Adherent Slough. 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 There was a Chemical/Enzymatic/Mechanical debridement performed by Deon Pilling, RN. after achieving pain control using Lidocaine 4% T opical Solution. Agent used was  [employer]. A time out was conducted at 08:55, prior to the start of the procedure. There was no bleeding. The procedure was tolerated well. Post Debridement Measurements: 16.4cm length x 7.5cm width x 0.1cm depth; 9.66cm^3 volume. Character of Wound/Ulcer Post Debridement is improved. Severity of Tissue Post Debridement is: Fat layer exposed. Post procedure Diagnosis Wound #2: Same as Pre-Procedure Plan Follow-up Appointments: Return Appointment in 1 week. - Wednesday Dressing Change Frequency: Wound #2 Right,Anterior Lower Leg: Change dressing every day. Skin Barriers/Peri-Wound Care: Moisturizing lotion - to leg daily Wound Cleansing: May shower and wash wound with soap and water. - use Dial antibacterial soap Primary Wound Dressing: Wound #2 Right,Anterior Lower Leg: Santyl Ointment - use medihoney if santyl unavailable Secondary Dressing: Wound #2 Right,Anterior Lower Leg: Kerlix/Rolled Gauze Dry Gauze ABD pad Edema Control: Avoid standing for long periods of time Elevate legs to the level of the heart or above for 30 minutes daily and/or when sitting, a frequency of: -  throughout the day Exercise regularly Services and Therapies ordered were: Arterial Studies- Bilateral with ABI and TBIs - non healing ulcer right anterior lower leg, vasculitic changes per punch biopsy The following medication(s) was prescribed: prednisone oral 50 mg tablet 1 1 tablet oral taken 1 time per day for 7 days starting 02/03/2020 1. I would recommend currently that we going continue with the wound care measures as before and the patient is agreement with the plan. This includes use of the Santyl which seems to be doing well. 2. I am also can recommend at this time that we initiate treatment with prednisone on medicine this into the pharmacy for him. There is no renal adjustment necessary being that he is on hemodialysis at this point. 3. I am also going to suggest that we continue to monitor the patient  closely I would like to see him back in the clinic next week to see how things are. He is in agreement with that plan. Electronic Signature(s) Signed: 02/03/2020 9:06:42 AM By: Worthy Keeler PA-C Entered By: Worthy Keeler on 02/03/2020 09:06:41 -------------------------------------------------------------------------------- SuperBill Details Patient Name: Date of Service: [name], [name] 02/03/2020 Medical Record Number: [id number] Patient Account Number: [id number] Date of Birth/Sex: Treating RN: 01/01/1966 (54 y.o. [name] Primary Care Provider: Precious Haws Other Clinician: Referring Provider: Treating Provider/Extender: [name], [name] in Treatment: 26 Diagnosis Coding ICD-10 Codes Code Description S80.811D Abrasion, right lower leg, subsequent encounter L97.812 Non-pressure chronic ulcer of other part of right lower leg with fat layer exposed E11.622 Type 2 diabetes mellitus with other skin ulcer I89.0 Lymphedema, not elsewhere classified I87.2 Venous insufficiency (chronic) (peripheral) Facility Procedures CPT4 Code: 40981191 Description: [id number] - DEBRIDE W/O ANES NON SELECT Modifier: Quantity: 1 Physician Procedures : CPT4 Code Description Modifier [id number] - WC PHYS LEVEL 4 - EST PT ICD-10 Diagnosis Description S80.811D Abrasion, right lower leg, subsequent encounter L97.812 Non-pressure chronic ulcer of other part of right lower leg with fat layer exposed  E11.622 Type 2 diabetes mellitus with other skin ulcer I89.0 Lymphedema, not elsewhere classified Quantity: 1 Electronic Signature(s) Signed: 02/03/2020 9:07:27 AM By: Worthy Keeler PA-C Entered By: Worthy Keeler on 02/03/2020 09:07:27

## 2020-02-04 NOTE — Progress Notes (Signed)
Biello, [name] ([id number]) Visit Report for 02/03/2020 Arrival Information Details Patient Name: Date of Service: [name] 02/03/2020 8:00 A M Medical Record Number: [id number] Patient Account Number: [id number] Date of Birth/Sex: Treating RN: [birth year] (54 y.o. [name], [name] Primary Care [name]: [name] Other Clinician: Referring [name]: Treating [name]/Extender: [name]: 26 Visit Information History Since Last Visit Added or deleted any medications: No Patient Arrived: Walker Any new allergies or adverse reactions: No Arrival Time: 08:09 Had a fall or experienced change in No Accompanied By: self activities of daily living that may affect Transfer Assistance: None risk of falls: Patient Identification Verified: Yes Signs or symptoms of abuse/neglect since last visito No Secondary Verification Process Completed: Yes Hospitalized since last visit: No Patient Requires Transmission-Based Precautions: No Implantable device outside of the clinic excluding No Patient Has Alerts: No cellular tissue based products placed in the center since last visit: Has Dressing in Place as Prescribed: Yes Pain Present Now: Yes Electronic Signature(s) Signed: 02/04/2020 5:24:43 PM By: Deon Pilling Entered By: Deon Pilling on 02/03/2020 08:11:30 -------------------------------------------------------------------------------- Encounter Discharge Information Details Patient Name: Date of Service: [name] 02/03/2020 8:00 A M Medical Record Number: [id number] Patient Account Number: [id number] Date of Birth/Sex: Treating RN: December 22, 1965 (54 y.o. [name] Primary Care Mikolaj Woolstenhulme: [name] Other Clinician: Referring Adenike Shidler: Treating [name]/Extender: [name]: 26 Encounter Discharge Information Items Post Procedure Vitals Discharge Condition: Stable Temperature (F):  97.8 Ambulatory Status: Walker Pulse (bpm): 83 Discharge Destination: Home Respiratory Rate (breaths/min): 20 Transportation: Private Auto Blood Pressure (mmHg): 147/87 Accompanied By: self Schedule Follow-up Appointment: Yes Clinical Summary of Care: Electronic Signature(s) Signed: 02/04/2020 5:24:43 PM By: Deon Pilling Entered By: Deon Pilling on 02/03/2020 09:05:03 -------------------------------------------------------------------------------- Lower Extremity Assessment Details Patient Name: Date of Service: [name] 02/03/2020 8:00 A M Medical Record Number: [id number] Patient Account Number: [id number] Date of Birth/Sex: Treating RN: [birth year] (54 y.o. [name] Primary Care Marranda Arakelian: [name] Other Clinician: Referring Deserie Dirks: Treating [name]/Extender: [name] Weeks in [name]: 26 Edema Assessment Assessed: [Left: No] [Right: Yes] Edema: [Left: N] [Right: o] Calf Left: Right: Point of Measurement: 31 cm From Medial Instep 41 cm Ankle Left: Right: Point of Measurement: 8 cm From Medial Instep 23.5 cm Vascular Assessment Pulses: Dorsalis Pedis Palpable: [Right:Yes] Electronic Signature(s) Signed: 02/04/2020 5:24:43 PM By: Deon Pilling Entered By: Deon Pilling on 02/03/2020 08:17:13 -------------------------------------------------------------------------------- [place] Details Patient Name: Date of Service: [name] 02/03/2020 8:00 A M Medical Record Number: [id number] Patient Account Number: [id number] Date of Birth/Sex: Treating RN: 21-Apr-1965 (54 y.o. [name] Primary Care [name]: [name] Other Clinician: Referring Ahava Kissoon: Treating [name]/Extender: [name] Weeks in [name]: 26 Active Inactive Venous Leg Ulcer Nursing Diagnoses: Knowledge deficit related to disease process and management Potential for venous Insuffiency (use before  diagnosis confirmed) Goals: Patient will maintain optimal edema control Date Initiated: 09/09/2019 Target Resolution Date: 03/03/2020 Goal Status: Active Patient/caregiver will verbalize understanding of disease process and disease management Date Initiated: 09/09/2019 Date Inactivated: 10/14/2019 Target Resolution Date: 10/07/2019 Goal Status: Met Interventions: Assess peripheral edema status every visit. Compression as ordered Provide education on venous insufficiency [name] Activities: Therapeutic compression applied : 09/09/2019 Notes: Wound/Skin Impairment Nursing Diagnoses: Knowledge deficit related to ulceration/compromised skin integrity Goals: Patient/caregiver will verbalize understanding of skin care regimen Date Initiated: 08/04/2019 Target Resolution Date: 02/25/2020 Goal Status: Active Ulcer/skin  breakdown will have a volume reduction of 30% by week 4 Date Initiated: 08/04/2019 Date Inactivated: 09/09/2019 Target Resolution Date: 09/11/2019 Goal Status: Met Ulcer/skin breakdown will have a volume reduction of 50% by week 8 Date Initiated: 09/09/2019 Date Inactivated: 10/14/2019 Target Resolution Date: 10/07/2019 Goal Status: Unmet Unmet Reason: awaiting reflux studies Ulcer/skin breakdown will have a volume reduction of 80% by week 12 Date Initiated: 10/14/2019 Date Inactivated: 11/18/2019 Target Resolution Date: 11/04/2019 Goal Status: Unmet Unmet Reason: uncontrolled htn Interventions: Assess patient/caregiver ability to obtain necessary supplies Assess patient/caregiver ability to perform ulcer/skin care regimen upon admission and as needed Assess ulceration(s) every visit Notes: Electronic Signature(s) Signed: 02/03/2020 5:36:04 PM By: [name] Gouty RN, BSN Entered By: [name] on 02/03/2020 08:38:40 -------------------------------------------------------------------------------- Pain Assessment Details Patient Name: Date of Service: [name]  02/03/2020 8:00 A M Medical Record Number: [id number] Patient Account Number: [id number] Date of Birth/Sex: Treating RN: 08/28/1965 (54 y.o. [name] Primary Care [name]: [name] Other Clinician: Referring [name]: Treating [name]/Extender: [name] Weeks in [name]: 26 Active Problems Location of Pain Severity and Description of Pain Patient Has Paino Yes Site Locations Pain Location: Pain Location: Pain in Ulcers Rate the pain. Current Pain Level: 9 Worst Pain Level: 10 Least Pain Level: 0 Tolerable Pain Level: 8 Character of Pain Describe the Pain: Burning, Dull, Heavy, Sharp Pain Management and Medication Current Pain Management: Medication: Yes Cold Application: No Rest: Yes Massage: No Activity: No T.E.N.S.: No Heat Application: No Leg drop or elevation: Yes Is the Current Pain Management Adequate: Inadequate How does your wound impact your activities of daily livingo Sleep: Yes Bathing: No Appetite: No Relationship With Others: No Bladder Continence: No Emotions: No Bowel Continence: No Work: No Toileting: No Drive: No Dressing: No Hobbies: Yes Electronic Signature(s) Signed: 02/04/2020 5:24:43 PM By: Deon Pilling Entered By: Deon Pilling on 02/03/2020 08:16:58 -------------------------------------------------------------------------------- Patient/Caregiver Education Details Patient Name: Date of Service: [name] 10/27/2021andnbsp8:00 A M Medical Record Number: [id number] Patient Account Number: [id number] Date of Birth/Gender: Treating RN: 11/27/1965 (54 y.o. [name] Primary Care Physician: [name] Other Clinician: Referring Physician: Treating Physician/Extender: Burnard Hawthorne in [name]: 26 Education Assessment Education Provided To: Patient Education Topics Provided Pain: Methods: Explain/Verbal Responses: Reinforcements needed, State content  correctly Venous: Methods: Explain/Verbal Responses: Reinforcements needed, State content correctly Wound/Skin Impairment: Methods: Explain/Verbal Responses: Reinforcements needed, State content correctly Electronic Signature(s) Signed: 02/03/2020 5:36:04 PM By: [name] Gouty RN, BSN Entered By: [name] on 02/03/2020 08:39:19 -------------------------------------------------------------------------------- Wound Assessment Details Patient Name: Date of Service: [name] 02/03/2020 8:00 A M Medical Record Number: [id number] Patient Account Number: [id number] Date of Birth/Sex: Treating RN: 1966/02/11 (54 y.o. [name] Primary Care Graylyn Bunney: [name] Other Clinician: Referring Velinda Wrobel: Treating Ramond Darnell/Extender: [name] Weeks in [name]: 26 Wound Status Wound Number: 2 Primary Venous Leg Ulcer Etiology: Wound Location: Right, Anterior Lower Leg Wound Open Wounding Event: Trauma Status: Date Acquired: 11/30/2019 Comorbid Lymphedema, Sleep Apnea, Arrhythmia, Congestive Heart Failure, Weeks Of [name]: 9 History: Hypertension, Peripheral Arterial Disease, Type II Diabetes Clustered Wound: No Wound Measurements Length: (cm) 16.4 Width: (cm) 7.5 Depth: (cm) 0.1 Area: (cm) 96.604 Volume: (cm) 9.66 % Reduction in Area: -1730.3% % Reduction in Volume: -1729.5% Epithelialization: Small (1-33%) Tunneling: No Undermining: No Wound Description Classification: Full Thickness Without Exposed Support Structures Wound Margin: Flat and Intact Exudate Amount: Medium Exudate Type: Serosanguineous Exudate Color: red, brown Foul Odor  After Cleansing: No Slough/Fibrino Yes Wound Bed Granulation Amount: Large (67-100%) Exposed Structure Necrotic Amount: Small (1-33%) Fascia Exposed: No Necrotic Quality: Adherent Slough Fat Layer (Subcutaneous Tissue) Exposed: Yes Tendon Exposed: No Muscle Exposed: No Joint Exposed: No Bone  Exposed: No [name] Notes Wound #2 (Right, Anterior Lower Leg) 1. Cleanse With Wound Cleanser 3. Primary Dressing Applied Santyl 4. Secondary Dressing ABD Pad Dry Gauze Roll Gauze Notes netting Electronic Signature(s) Signed: 02/04/2020 5:24:43 PM By: Deon Pilling Entered By: Deon Pilling on 02/03/2020 08:18:55 -------------------------------------------------------------------------------- Vitals Details Patient Name: Date of Service: [name] 02/03/2020 8:00 A M Medical Record Number: [id number] Patient Account Number: [id number] Date of Birth/Sex: Treating RN: December 02, 1965 (54 y.o. [name] Primary Care Renlee Floor: [name] Other Clinician: Referring Jadalee Westcott: Treating Dayona Shaheen/Extender: [name] Weeks in [name]: 26 Vital Signs Time Taken: 08:11 Temperature (F): 97.8 Weight (lbs): 256 Pulse (bpm): 83 Respiratory Rate (breaths/min): 18 Blood Pressure (mmHg): 147/87 Capillary Blood Glucose (mg/dl): 118 Reference Range: 80 - 120 mg / dl Electronic Signature(s) Signed: 02/04/2020 5:24:43 PM By: Deon Pilling Entered By: Deon Pilling on 02/03/2020 08:16:10

## 2020-02-08 ENCOUNTER — Telehealth: Payer: Self-pay | Admitting: Cardiology

## 2020-02-08 NOTE — Telephone Encounter (Signed)
Left detailed message on Jennifer's VM at [facility] letting her know that per Medical Records she will need to fax over request on a letterhead.

## 2020-02-08 NOTE — Telephone Encounter (Signed)
Anderson Malta with [facility] family meds , called in and would like the office notes from 10/28 with Dr Donell Beers faxed over to their office   Fax number [phone] Phone number  [phone]

## 2020-02-10 ENCOUNTER — Other Ambulatory Visit: Payer: Self-pay

## 2020-02-10 ENCOUNTER — Encounter (HOSPITAL_BASED_OUTPATIENT_CLINIC_OR_DEPARTMENT_OTHER): Payer: Medicare Other | Attending: Physician Assistant | Admitting: Physician Assistant

## 2020-02-10 DIAGNOSIS — I5032 Chronic diastolic (congestive) heart failure: Secondary | ICD-10-CM | POA: Insufficient documentation

## 2020-02-10 DIAGNOSIS — N184 Chronic kidney disease, stage 4 (severe): Secondary | ICD-10-CM | POA: Diagnosis not present

## 2020-02-10 DIAGNOSIS — E1140 Type 2 diabetes mellitus with diabetic neuropathy, unspecified: Secondary | ICD-10-CM | POA: Insufficient documentation

## 2020-02-10 DIAGNOSIS — E1122 Type 2 diabetes mellitus with diabetic chronic kidney disease: Secondary | ICD-10-CM | POA: Diagnosis not present

## 2020-02-10 DIAGNOSIS — I890 Lymphedema, not elsewhere classified: Secondary | ICD-10-CM | POA: Diagnosis not present

## 2020-02-10 DIAGNOSIS — I129 Hypertensive chronic kidney disease with stage 1 through stage 4 chronic kidney disease, or unspecified chronic kidney disease: Secondary | ICD-10-CM | POA: Diagnosis not present

## 2020-02-10 DIAGNOSIS — N182 Chronic kidney disease, stage 2 (mild): Secondary | ICD-10-CM | POA: Diagnosis not present

## 2020-02-10 DIAGNOSIS — L97812 Non-pressure chronic ulcer of other part of right lower leg with fat layer exposed: Secondary | ICD-10-CM | POA: Insufficient documentation

## 2020-02-10 DIAGNOSIS — I130 Hypertensive heart and chronic kidney disease with heart failure and stage 1 through stage 4 chronic kidney disease, or unspecified chronic kidney disease: Secondary | ICD-10-CM | POA: Insufficient documentation

## 2020-02-10 DIAGNOSIS — I872 Venous insufficiency (chronic) (peripheral): Secondary | ICD-10-CM | POA: Diagnosis not present

## 2020-02-10 DIAGNOSIS — E11622 Type 2 diabetes mellitus with other skin ulcer: Secondary | ICD-10-CM | POA: Insufficient documentation

## 2020-02-10 DIAGNOSIS — G4730 Sleep apnea, unspecified: Secondary | ICD-10-CM | POA: Diagnosis not present

## 2020-02-10 NOTE — Progress Notes (Addendum)
[name] ([id number]) Visit Report for 02/10/2020 Chief Complaint Document Details Patient Name: Date of Service: [name] 02/10/2020 8:00 A M Medical Record Number: [id number] Patient Account Number: [id number] Date of Birth/Sex: Treating RN: [birth year] (54 y.o. [name] Primary Care Provider: Precious Haws Other Clinician: Referring Provider: Treating Provider/Extender: [name], [name] in Treatment: 27 Information Obtained from: Patient Chief Complaint 08/04/2019; patient is here for review of the wound on the right lateral lower leg Electronic Signature(s) Signed: 02/10/2020 8:06:58 AM By: Worthy Keeler PA-C Entered By: Worthy Keeler on 02/10/2020 08:06:57 -------------------------------------------------------------------------------- Debridement Details Patient Name: Date of Service: [name] 02/10/2020 8:00 A M Medical Record Number: [id number] Patient Account Number: [id number] Date of Birth/Sex: Treating RN: 01-16-1966 (54 y.o. [name] Primary Care Provider: Precious Haws Other Clinician: Referring Provider: Treating Provider/Extender: Katheran Awe Weeks in Treatment: 27 Debridement Performed for Assessment: Wound #2 Right,Anterior Lower Leg Performed By: Physician Worthy Keeler, PA Debridement Type: Debridement Severity of Tissue Pre Debridement: Fat layer exposed Level of Consciousness (Pre-procedure): Awake and Alert Pre-procedure Verification/Time Out Yes - 08:35 Taken: Start Time: 08:36 Pain Control: Other : benzocaine 20% spray T Area Debrided (L x W): otal 2 (cm) x 2 (cm) = 4 (cm) Tissue and other material debrided: Viable, Non-Viable, Slough, Subcutaneous, Slough Level: Skin/Subcutaneous Tissue Debridement Description: Excisional Instrument: Curette Bleeding: Minimum Hemostasis Achieved: Pressure End Time: 08:40 Procedural Pain: 9 Post Procedural Pain: 5 Response to Treatment:  Procedure was tolerated well Level of Consciousness (Post- Awake and Alert procedure): Post Debridement Measurements of Total Wound Length: (cm) 7 Width: (cm) 6.1 Depth: (cm) 1 Volume: (cm) 33.537 Character of Wound/Ulcer Post Debridement: Improved Severity of Tissue Post Debridement: Fat layer exposed Post Procedure Diagnosis Same as Pre-procedure Electronic Signature(s) Signed: 02/10/2020 5:20:58 PM By: Worthy Keeler PA-C Signed: 02/10/2020 5:51:45 PM By: Baruch Gouty RN, BSN Entered By: Baruch Gouty on 02/10/2020 08:40:46 -------------------------------------------------------------------------------- HPI Details Patient Name: Date of Service: [name] 02/10/2020 8:00 A M Medical Record Number: [id number] Patient Account Number: [id number] Date of Birth/Sex: Treating RN: [birth year] (54 y.o. [name] Primary Care Provider: Precious Haws Other Clinician: Referring Provider: Treating Provider/Extender: Katheran Awe Weeks in Treatment: 27 History of Present Illness HPI Description: ADMISSION 08/04/2019 This is a 54 year old man who traumatized his right lateral lower leg while going up an escalator at the mall sometime in December 2020. He was referred to vascular surgery and on 04/14/2019 he saw Dr. Wenda Overland. He was felt to have "palpable pulses and normal noninvasive studies" although I cannot see these results. He was back in his primary doctor's office on 07/30/2019 for a nonhealing right lower extremity wound. He was referred to wound care I think in [facility] but his insurance was out of network. A CNS was ordered. X-ray was done that was negative. He has been using wound cleanser and a Band-Aid. Past medical history is actually quite extensive and includes chronic kidney disease stage IV, chronic diastolic heart failure, atrial fibrillation, poorly controlled hypertension, poorly controlled diabetes with peripheral neuropathy and a recent  hemoglobin A1c of 11.5, depression, lymphedema and sleep apnea ABI in our clinic was 1.1 in the right 5/6; this wound was initially traumatized while the patient was going down on an escalator. Arrived in clinic last week with a completely nonviable surface. He required mechanical debridement and we have been using Iodoflex under compression. Apparently the  compression wraps fell down. He also has poorly controlled diabetes 53/15; traumatic/contusion wound to the right lateral lower leg. We have been using Iodoflex to clean at the surface of this which was completely nonviable along with mechanical debridements. He is under compression. 09/02/2019 upon evaluation today patient appears to be doing well with regard to his wound on his right lateral leg. He has been tolerating the dressing changes without complication. Fortunately there is no signs of active infection at this time. No fevers, chills, nausea, vomiting, or diarrhea. With that being said there is some necrotic tissue around the edges of the wound that is and require some sharp debridement today. That was discussed with the patient and his mother in the office today prior to debridement to which she did consent. 09/09/2019 upon evaluation today patient appears to be making progress in regard to his wound. Fortunately the wound is not nearly as deep as what it was. It is measuring a little bit larger due to having to clear some of the edges away but again that is necessary to get this to heal. Fortunately there is no evidence of active infection at this time. No fevers, chills, nausea, vomiting, or diarrhea. 09/16/2019 on evaluation today patient appears to be doing excellent in regard to his wound currently. In fact I do not see anything that we can need to debride today which is great news. The wound bed looks healthy and the edges of the wound look like they are doing great. Overall I feel like we are at a good place and headed in the proper  direction. 09/23/2019 upon evaluation today patient appears to be doing okay in regard to his wound. This is not measuring any smaller it is about the same. With that being said he tells me that his wrap actually slid down he ended up having to take this off he was on a trip and did not have his Velcro compression with him. With that being said I do not see any signs of anything significantly worsening which is good news. 09/30/2019 upon evaluation today patient appears to be doing a little bit better in regard to his leg ulcer. He has been tolerating the dressing changes without complication. Fortunately there is no signs of active infection at this time. No fevers, chills, nausea, vomiting, or diarrhea. 10/14/2019 upon evaluation today patient's wound appears to be healthier. Overall but unfortunately is measuring a little bit bigger. With that being said I do think that we do need to still continue to wrap his leg. Fortunately there is no signs of active infection at this time which is great news. 11/18/2019 upon evaluation today patient actually appears to be doing decently well with regard to his wound. He has been tolerating the dressing changes without complication. Fortunately there is no signs of active infection has been using just Neosporin at this point since he was in the hospital. He did have a venous Doppler study in the hospital though I cannot see the results that was from November 04, 2019. Nonetheless overall I am pleased with the fact the patient does seem to be doing better. 11/25/2019 on evaluation today patient appears to be doing well with regard to his wounds. He has been tolerating the dressing changes without complication. Fortunately there is no signs of active infection at this time. No fevers, chills, nausea, vomiting, or diarrhea. I do believe that he does not appear to be showing any signs of worsening although he also seems to be  having some trouble getting this to proceed in a  good fashion towards healing. 12/02/2019 on evaluation today patient actually appears to be doing not quite as well in regard to his wounds. Both have some slough noted on the surface of the wound. Unfortunately he is continuing to have some issues here with pain as well in the anterior portion which I think is an issue. Fortunately there is no signs of active infection at this time which is great news. No fevers, chills, nausea, vomiting, or diarrhea. 12/30/2019 upon evaluation today patient unfortunately has not been seen in the past month we are just now seeing him back for reevaluation today last time I saw him was August 25. At that point he had a lateral wound on his leg and an anterior wound which which is starting. The lateral has healed unfortunately the anterior looks worse. With that being said the patient unfortunately seems to be having some issues today that I cannot completely explain by just the way the wound appears. The wound itself is not really hot to touch and to be honest I would not necessarily assume infection upon initial inspection. With that being said he is acting very lethargic, was weak walking into the clinic according to his mother who is with him today, his blood pressure is actually in a normal range at around 110/73 which is unusual for him he normally runs extremely high in the 160F for systolic. His blood glucose was 227 here in the clinic today respiratory rate was 18 and his temperature was 97.7. Pulse was right around 70 when I checked this manually. Nonetheless although his vital signs and normal circumstances would be reassuring I am still kind of concerned just because of the way he is acting. I did question him about medications that he has taken he told me that he took 2 Tylenol and tramadol this morning he also tells me he took 2 tramadol last night. With that being said I do not believe that just taking the tramadol is accounting for what is going on as well  based on what I see today. I discussed all this with his mother as well as the patient face-to-face in the office today and subsequently I think I would recommend further evaluation at the ER as I really feel like there is something going on here that I cannot identify just here in the clinic. 01/13/2020 upon evaluation today patient presents for follow-up concerning ongoing issues with his right leg ulcer. After I last saw him 2 weeks ago he was actually sent to the ER where subsequently he ended up being placed on dialysis. He is doing better although he still seems very tired I think still in general he is doing much better. Fortunately there is no signs of active infection at this time. No fevers, chills, nausea, vomiting, or diarrhea. 01/26/2020; this is a patient with a large irregular albeit superficial wound on the right anterior lower leg. He has been using Santyl. He has recently been initiated on dialysis after we sent him to the ER earlier this month. He describes the wound is being very painful. By his description this started as a small raised area that rapidly expanded. He does not have a lot of obvious venous issues. Looking back through the records this was described as initially traumatic in April. He has seen vascular surgery and not felt to have an arterial issue 02/03/2020 upon evaluation today patient actually appears to be doing a little better  in regard to his ulcer although it has spread inferiorly the central portion of the wound and towards the upper has actually new skin growth. Fortunately there is no signs of active infection at this time which is great news. 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 With that being said some of the more significant issues here such as angiosarcoma does not appear to be likely based on pathology report. It seems that were more in the arterial or possible calciphylaxis realm based on what I am seeing visually. 02/10/2020 on evaluation today patient actually making some progress in regard to his leg ulcer. There is a lot of new skin growth which is excellent he tells me he still having a lot of pain but in general this seems to be showing signs of dramatic improvement which is great news. Overall I think that he is continuing to tolerate the Santyl along with ABD pad and roll gauze to secure. Electronic Signature(s) Signed: 02/10/2020 9:43:08 AM By: Worthy Keeler PA-C Previous Signature: 02/10/2020 8:23:56 AM Version By: Worthy Keeler PA-C Previous Signature: 02/10/2020 8:21:40 AM Version By: Worthy Keeler PA-C Entered By: Worthy Keeler on 02/10/2020 09:43:08 -------------------------------------------------------------------------------- Physical Exam Details Patient Name: Date of Service: [name] 02/10/2020 8:00 A M Medical Record Number: [id number] Patient Account Number: [id number] Date of Birth/Sex: Treating RN: [birth year] (54 y.o. [name] Primary Care Provider: Precious Haws Other Clinician: Referring Provider: Treating Provider/Extender: [name], [name] in Treatment: 27 Constitutional Obese and well-hydrated in no acute distress. Respiratory normal breathing without difficulty. Psychiatric this patient is able to make decisions and demonstrates good insight into disease process. Alert and Oriented x 3. pleasant and cooperative. Notes [facility] patient's wound bed actually showed signs of good granulation at this time. There does not appear to be any evidence of active infection he did have some slough noted I did perform a minimal  sharp debridement to remove some of the necrotic tissue in the biopsy site so that we can packed this better with some alginate and allow it to heal more effectively and quickly. He did have some discomfort with this but fortunately it was not too significant. Post debridement everything appears to be doing better which is great news Electronic Signature(s) Signed: 02/10/2020 9:43:35 AM By: Worthy Keeler PA-C Previous Signature: 02/10/2020 8:24:27 AM Version By: Worthy Keeler PA-C Previous Signature: 02/10/2020 8:22:12 AM Version By: Worthy Keeler PA-C Entered By: Worthy Keeler on 02/10/2020 09:43:34 -------------------------------------------------------------------------------- Physician Orders Details Patient Name: Date of Service: [name] 02/10/2020 8:00 A M Medical Record Number: [id number] Patient Account Number: [id number] Date of Birth/Sex: Treating RN: 20-Nov-1965 (54 y.o. [name] Primary Care Provider: Precious Haws Other Clinician: Referring Provider: Treating Provider/Extender: Katheran Awe Weeks in Treatment: 27 Verbal / Phone Orders: No Diagnosis Coding ICD-10 Coding Code Description S80.811D Abrasion, right lower leg, subsequent encounter L97.812 Non-pressure chronic ulcer of other part of right lower leg with fat layer exposed E11.622 Type 2 diabetes mellitus with other skin ulcer I89.0 Lymphedema, not elsewhere classified I87.2 Venous insufficiency (chronic) (peripheral) Follow-up Appointments ppointment in 1 week. - Wednesday Return A Dressing Change Frequency Wound #2  Right,Anterior Lower Leg Change dressing every day. Wound #3 Right,Proximal,Anterior Lower Leg Change dressing every day. Skin Barriers/Peri-Wound Care Moisturizing lotion - to leg daily Wound Cleansing May shower and wash wound with soap and water. - use Dial antibacterial soap Primary Wound Dressing Wound #2 Right,Anterior Lower Leg lginate - pack strip  lightly into hole medial aspect of ulcer with santyl at base of hole Calcium A Santyl Ointment Wound #3 Right,Proximal,Anterior Lower Leg Santyl Ointment Secondary Dressing Wound #2 Right,Anterior Lower Leg Kerlix/Rolled Gauze Dry Gauze ABD pad Edema Control Avoid standing for long periods of time Elevate legs to the level of the heart or above for 30 minutes daily and/or when sitting, a frequency of: - throughout the day Exercise regularly Patient Medications llergies: No Known Drug Allergies A Notifications Medication Indication Start End prior to debridement 02/10/2020 benzocaine DOSE topical 20 % aerosol - aerosol topical Electronic Signature(s) Signed: 02/10/2020 5:20:58 PM By: Worthy Keeler PA-C Signed: 02/10/2020 5:51:45 PM By: Baruch Gouty RN, BSN Entered By: Baruch Gouty on 02/10/2020 08:44:49 -------------------------------------------------------------------------------- Problem List Details Patient Name: Date of Service: [name] 02/10/2020 8:00 A M Medical Record Number: [id number] Patient Account Number: [id number] Date of Birth/Sex: Treating RN: [birth year] (54 y.o. [name] Primary Care Provider: Precious Haws Other Clinician: Referring Provider: Treating Provider/Extender: Katheran Awe Weeks in Treatment: 27 Active Problems ICD-10 Encounter Code Description Active Date MDM Diagnosis S80.811D Abrasion, right lower leg, subsequent encounter 08/04/2019 No Yes L97.812 Non-pressure chronic ulcer of other part of right lower leg with fat layer 08/04/2019 No Yes exposed E11.622 Type 2 diabetes mellitus with other skin ulcer 08/04/2019 No Yes I89.0 Lymphedema, not elsewhere classified 08/04/2019 No Yes I87.2 Venous insufficiency (chronic) (peripheral) 09/30/2019 No Yes Inactive Problems Resolved Problems Electronic Signature(s) Signed: 02/10/2020 8:06:48 AM By: Worthy Keeler PA-C Entered By: Worthy Keeler on 02/10/2020  08:06:47 -------------------------------------------------------------------------------- Progress Note Details Patient Name: Date of Service: [name] 02/10/2020 8:00 A M Medical Record Number: [id number] Patient Account Number: [id number] Date of Birth/Sex: Treating RN: 05/11/1965 (54 y.o. [name] Primary Care Provider: Precious Haws Other Clinician: Referring Provider: Treating Provider/Extender: [name], [name] in Treatment: 27 Subjective Chief Complaint Information obtained from Patient 08/04/2019; patient is here for review of the wound on the right lateral lower leg History of Present Illness (HPI) ADMISSION 08/04/2019 This is a 54 year old man who traumatized his right lateral lower leg while going [place] the mall sometime in December 2020. He was referred to vascular surgery and on 04/14/2019 he saw Dr. Wenda Overland. He was felt to have "palpable pulses and normal noninvasive studies" although I cannot see these results. He was back in his primary doctor's office on 07/30/2019 for a nonhealing right lower extremity wound. He was referred to wound care I think in [facility] but his insurance was out of network. A CNS was ordered. X-ray was done that was negative. He has been using wound cleanser and a Band-Aid. Past medical history is actually quite extensive and includes chronic kidney disease stage IV, chronic diastolic heart failure, atrial fibrillation, poorly controlled hypertension, poorly controlled diabetes with peripheral neuropathy and a recent hemoglobin A1c of 11.5, depression, lymphedema and sleep apnea ABI in our clinic was 1.1 in the right 5/6; this wound was initially traumatized while the patient was going down on an escalator. Arrived in clinic last week with a completely nonviable surface. He required mechanical debridement and we  have been using Iodoflex under compression. Apparently the compression wraps fell down. He  also has poorly controlled diabetes 5/15; traumatic/contusion wound to the right lateral lower leg. We have been using Iodoflex to clean at the surface of this which was completely nonviable along with mechanical debridements. He is under compression. 09/02/2019 upon evaluation today patient appears to be doing well with regard to his wound on his right lateral leg. He has been tolerating the dressing changes without complication. Fortunately there is no signs of active infection at this time. No fevers, chills, nausea, vomiting, or diarrhea. With that being said there is some necrotic tissue around the edges of the wound that is and require some sharp debridement today. That was discussed with the patient and his mother in the office today prior to debridement to which she did consent. 09/09/2019 upon evaluation today patient appears to be making progress in regard to his wound. Fortunately the wound is not nearly as deep as what it was. It is measuring a little bit larger due to having to clear some of the edges away but again that is necessary to get this to heal. Fortunately there is no evidence of active infection at this time. No fevers, chills, nausea, vomiting, or diarrhea. 09/16/2019 on evaluation today patient appears to be doing excellent in regard to his wound currently. In fact I do not see anything that we can need to debride today which is great news. The wound bed looks healthy and the edges of the wound look like they are doing great. Overall I feel like we are at a good place and headed in the proper direction. 09/23/2019 upon evaluation today patient appears to be doing okay in regard to his wound. This is not measuring any smaller it is about the same. With that being said he tells me that his wrap actually slid down he ended up having to take this off he was on a trip and did not have his Velcro compression with him. With that being said I do not see any signs of anything significantly  worsening which is good news. 09/30/2019 upon evaluation today patient appears to be doing a little bit better in regard to his leg ulcer. He has been tolerating the dressing changes without complication. Fortunately there is no signs of active infection at this time. No fevers, chills, nausea, vomiting, or diarrhea. 10/14/2019 upon evaluation today patient's wound appears to be healthier. Overall but unfortunately is measuring a little bit bigger. With that being said I do think that we do need to still continue to wrap his leg. Fortunately there is no signs of active infection at this time which is great news. 11/18/2019 upon evaluation today patient actually appears to be doing decently well with regard to his wound. He has been tolerating the dressing changes without complication. Fortunately there is no signs of active infection has been using just Neosporin at this point since he was in the hospital. He did have a venous Doppler study in the hospital though I cannot see the results that was from November 04, 2019. Nonetheless overall I am pleased with the fact the patient does seem to be doing better. 11/25/2019 on evaluation today patient appears to be doing well with regard to his wounds. He has been tolerating the dressing changes without complication. Fortunately there is no signs of active infection at this time. No fevers, chills, nausea, vomiting, or diarrhea. I do believe that he does not appear to be showing any  signs of worsening although he also seems to be having some trouble getting this to proceed in a good fashion towards healing. 12/02/2019 on evaluation today patient actually appears to be doing not quite as well in regard to his wounds. Both have some slough noted on the surface of the wound. Unfortunately he is continuing to have some issues here with pain as well in the anterior portion which I think is an issue. Fortunately there is no signs of active infection at this time which is  great news. No fevers, chills, nausea, vomiting, or diarrhea. 12/30/2019 upon evaluation today patient unfortunately has not been seen in the past month we are just now seeing him back for reevaluation today last time I saw him was August 25. At that point he had a lateral wound on his leg and an anterior wound which which is starting. The lateral has healed unfortunately the anterior looks worse. With that being said the patient unfortunately seems to be having some issues today that I cannot completely explain by just the way the wound appears. The wound itself is not really hot to touch and to be honest I would not necessarily assume infection upon initial inspection. With that being said he is acting very lethargic, was weak walking into the clinic according to his mother who is with him today, his blood pressure is actually in a normal range at around 110/73 which is unusual for him he normally runs extremely high in the 518A for systolic. His blood glucose was 227 here in the clinic today respiratory rate was 18 and his temperature was 97.7. Pulse was right around 70 when I checked this manually. Nonetheless although his vital signs and normal circumstances would be reassuring I am still kind of concerned just because of the way he is acting. I did question him about medications that he has taken he told me that he took 2 Tylenol and tramadol this morning he also tells me he took 2 tramadol last night. With that being said I do not believe that just taking the tramadol is accounting for what is going on as well based on what I see today. I discussed all this with his mother as well as the patient face-to-face in the office today and subsequently I think I would recommend further evaluation at the ER as I really feel like there is something going on here that I cannot identify just here in the clinic. 01/13/2020 upon evaluation today patient presents for follow-up concerning ongoing issues with his  right leg ulcer. After I last saw him 2 weeks ago he was actually sent to the ER where subsequently he ended up being placed on dialysis. He is doing better although he still seems very tired I think still in general he is doing much better. Fortunately there is no signs of active infection at this time. No fevers, chills, nausea, vomiting, or diarrhea. 01/26/2020; this is a patient with a large irregular albeit superficial wound on the right anterior lower leg. He has been using Santyl. He has recently been initiated on dialysis after we sent him to the ER earlier this month. He describes the wound is being very painful. By his description this started as a small raised area that rapidly expanded. He does not have a lot of obvious venous issues. Looking back through the records this was described as initially traumatic in April. He has seen vascular surgery and not felt to have an arterial issue 02/03/2020 upon evaluation today  patient actually appears to be doing a little better in regard to his ulcer although it has spread inferiorly the central portion of the wound and towards the upper has actually new skin growth. Fortunately there is no signs of active infection at this time which is great news. 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 With that being said some of the more significant issues here such as angiosarcoma does not appear to be likely based on pathology report. It seems that were more in the arterial or possible calciphylaxis realm based on what I am seeing visually. 02/10/2020 on evaluation today patient actually making some progress in regard  to his leg ulcer. There is a lot of new skin growth which is excellent he tells me he still having a lot of pain but in general this seems to be showing signs of dramatic improvement which is great news. Overall I think that he is continuing to tolerate the Santyl along with ABD pad and roll gauze to secure. Objective Constitutional Obese and well-hydrated in no acute distress. Vitals Time Taken: 8:05 AM, Weight: 256 lbs, Temperature: 98.2 F, Pulse: 79 bpm, Respiratory Rate: 18 breaths/min, Blood Pressure: 162/98 mmHg, Capillary Blood Glucose: 78 mg/dl. General Notes: glucose per pt report Respiratory normal breathing without difficulty. Psychiatric this patient is able to make decisions and demonstrates good insight into disease process. Alert and Oriented x 3. pleasant and cooperative. General Notes: [facility] patient's wound bed actually showed signs of good granulation at this time. There does not appear to be any evidence of active infection he did have some slough noted I did perform a minimal sharp debridement to remove some of the necrotic tissue in the biopsy site so that we can packed this better with some alginate and allow it to heal more effectively and quickly. He did have some discomfort with this but fortunately it was not too significant. Post debridement everything appears to be doing better which is great news Integumentary (Hair, Skin) Wound #2 status is Open. Original cause of wound was Trauma. The wound is located on the Right,Anterior Lower Leg. The wound measures 7cm length x 6.1cm width x 1cm depth; 33.537cm^2 area and 33.537cm^3 volume. There is Fat Layer (Subcutaneous Tissue) exposed. There is no tunneling or undermining noted. There is a medium amount of purulent drainage noted. The wound margin is flat and intact. There is medium (34-66%) pink granulation within the wound bed. There is a medium (34-66%) amount of necrotic tissue within the wound bed including  Adherent Slough. Wound #3 status is Open. Original cause of wound was Gradually Appeared. The wound is located on the Right,Proximal,Anterior Lower Leg. The wound measures 1.5cm length x 3cm width x 0.1cm depth; 3.534cm^2 area and 0.353cm^3 volume. There is Fat Layer (Subcutaneous Tissue) exposed. There is no tunneling or undermining noted. There is a medium amount of serosanguineous drainage noted. The wound margin is flat and intact. There is medium (34-66%) red granulation within the wound bed. There is a medium (34-66%) amount of necrotic tissue within the wound bed including Adherent Slough. 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 There was a Excisional Skin/Subcutaneous Tissue Debridement with a total area of 4 sq cm performed by Worthy Keeler, PA. With the following instrument(s): Curette to remove Viable and Non-Viable tissue/material. Material removed includes Subcutaneous Tissue and Slough and after achieving pain control using Other (benzocaine 20% spray). No specimens were taken. A time out was conducted at 08:35, prior to the start of the procedure. A Minimum amount of bleeding was controlled with Pressure. The procedure was tolerated well with a pain level of 9 throughout and a pain level of 5 following the procedure. Post Debridement Measurements: 7cm length x 6.1cm width x 1cm depth; 33.537cm^3 volume. Character of Wound/Ulcer Post Debridement is improved. Severity of Tissue Post Debridement is: Fat layer exposed. Post procedure Diagnosis Wound #2: Same  as Pre-Procedure Plan Follow-up Appointments: Return Appointment in 1 week. - Wednesday Dressing Change Frequency: Wound #2 Right,Anterior Lower Leg: Change dressing every day. Wound #3 Right,Proximal,Anterior Lower Leg: Change dressing every day. Skin Barriers/Peri-Wound Care: Moisturizing lotion - to leg daily Wound Cleansing: May shower and wash wound with soap and water. - use Dial antibacterial soap Primary Wound Dressing: Wound #2 Right,Anterior Lower Leg: Calcium Alginate - pack strip lightly into hole medial aspect of ulcer with santyl at base of hole Santyl Ointment Wound #3 Right,Proximal,Anterior Lower Leg: Santyl Ointment Secondary Dressing: Wound #2 Right,Anterior Lower Leg: Kerlix/Rolled Gauze Dry Gauze ABD pad Edema Control: Avoid standing for long periods of time Elevate legs to the level of the heart or above for 30 minutes daily and/or when sitting, a frequency of: - throughout the day Exercise regularly The following medication(s) was prescribed: benzocaine topical 20 % aerosol aerosol topical for prior to debridement was prescribed at facility 1. I would recommend currently that we go and continue with the wound care measures as before and the patient is in agreement with that plan. We will have him continue to place the Santyl although on the 2 biopsy sites to also pack a little bit of plain alginate and behind to fill the space so that he does not have any empty space/vacuum here to delay healing. 2. Also recommend he continue covering the ABD pad and roll gauze. 3. I would also recommend he continue to elevate his legs much as possible. We will see patient back for reevaluation in 1 week here in the clinic. If anything worsens or changes patient will contact our office for additional recommendations. Electronic Signature(s) Signed: 02/10/2020 9:44:09 AM By: Worthy Keeler PA-C Entered By: Worthy Keeler on 02/10/2020  09:44:09 -------------------------------------------------------------------------------- SuperBill Details Patient Name: Date of Service: [name] 02/10/2020 Medical Record Number: [id number] Patient Account Number: [id number] Date of Birth/Sex: Treating RN: 1965-12-02 (54 y.o. [name] Primary Care Provider: Precious Haws Other Clinician: Referring Provider: Treating Provider/Extender: [name], [name] in Treatment: 27 Diagnosis Coding ICD-10 Codes Code Description S80.811D Abrasion, right lower leg, subsequent encounter L97.812 Non-pressure chronic ulcer of other part of right lower leg with fat layer exposed E11.622 Type 2 diabetes mellitus with other skin ulcer I89.0 Lymphedema, not elsewhere classified I87.2 Venous insufficiency (chronic) (peripheral) Facility Procedures CPT4 Code: 62831517 Description: 11042 - DEB SUBQ TISSUE 20 SQ CM/< ICD-10 Diagnosis Description L97.812 Non-pressure chronic ulcer of other part of right  lower leg with fat layer exp Modifier: osed Quantity: 1 Physician Procedures Electronic Signature(s) Signed: 02/10/2020 9:44:21 AM By: Worthy Keeler PA-C Entered By: Worthy Keeler on 02/10/2020 09:44:18

## 2020-02-11 NOTE — Progress Notes (Signed)
[name], [name] ([id number]) Visit Report for 02/10/2020 Arrival Information Details Patient Name: Date of Service: [name], [name] 02/10/2020 8:00 A M Medical Record Number: [id number] Patient Account Number: [id number] Date of Birth/Sex: Treating RN: 14-Jul-1965 (54 y.o. [name] Primary Care Jamillah Camilo: [name] Other Clinician: Referring Nakina Spatz: Treating Devrin Monforte/Extender: Katheran Awe [name] in Treatment: 27 Visit Information History Since Last Visit Added or deleted any medications: No Patient Arrived: Ambulatory Any new allergies or adverse reactions: No Arrival Time: 08:02 Had a fall or experienced change in No Accompanied By: mother activities of daily living that may affect Transfer Assistance: None risk of falls: Patient Identification Verified: Yes Signs or symptoms of abuse/neglect since last visito No Secondary Verification Process Completed: Yes Hospitalized since last visit: No Patient Requires Transmission-Based Precautions: No Implantable device outside of the clinic excluding No Patient Has Alerts: No cellular tissue based products placed in the center since last visit: Has Dressing in Place as Prescribed: Yes Pain Present Now: Yes Electronic Signature(s) Signed: 02/11/2020 5:54:11 PM By: Levan Hurst RN, BSN Entered By: Levan Hurst on 02/10/2020 08:05:42 -------------------------------------------------------------------------------- Encounter Discharge Information Details Patient Name: Date of Service: [name], [name] 02/10/2020 8:00 A M Medical Record Number: [id number] Patient Account Number: [id number] Date of Birth/Sex: Treating RN: 1965/11/11 (54 y.o. [name] Primary Care Tyjanae Bartek: [name] Other Clinician: Referring [name]: Treating [name]/Extender: [name], [name] in Treatment: 27 Encounter Discharge Information Items Post Procedure Vitals Discharge Condition: Stable Temperature (F):  98.2 Ambulatory Status: Walker Pulse (bpm): 79 Discharge Destination: Home Respiratory Rate (breaths/min): 18 Transportation: Private Auto Blood Pressure (mmHg): 162/98 Accompanied By: mother Schedule Follow-up Appointment: Yes Clinical Summary of Care: Electronic Signature(s) Signed: 02/10/2020 5:31:28 PM By: Deon Pilling Entered By: Deon Pilling on 02/10/2020 09:09:34 -------------------------------------------------------------------------------- Lower Extremity Assessment Details Patient Name: Date of Service: [name], [name] 02/10/2020 8:00 A M Medical Record Number: [id number] Patient Account Number: [id number] Date of Birth/Sex: Treating RN: 1965/08/18 (54 y.o. [name] Primary Care Noel Rodier: [name] Other Clinician: Referring Laporsche [name]: Treating [name]/Extender: [name], [name] in Treatment: 27 Edema Assessment Assessed: [Left: No] [Right: No] Edema: [Left: N] [Right: o] Calf Left: Right: Point of Measurement: 31 cm From Medial Instep 40 cm Ankle Left: Right: Point of Measurement: 8 cm From Medial Instep 23.2 cm Vascular Assessment Pulses: Dorsalis Pedis Palpable: [Right:Yes] Electronic Signature(s) Signed: 02/11/2020 5:54:11 PM By: Levan Hurst RN, BSN Entered By: Levan Hurst on 02/10/2020 08:12:48 -------------------------------------------------------------------------------- [place] Details Patient Name: Date of Service: [name], [name] 02/10/2020 8:00 A M Medical Record Number: [id number] Patient Account Number: [id number] Date of Birth/Sex: Treating RN: [birth year] (54 y.o. [name] Primary Care [name]: [name] Other Clinician: Referring [name]: Treating [name]/Extender: [name], [name] in Treatment: 27 Active Inactive Venous Leg Ulcer Nursing Diagnoses: Knowledge deficit related to disease process and management Potential for venous Insuffiency (use  before diagnosis confirmed) Goals: Patient will maintain optimal edema control Date Initiated: 09/09/2019 Target Resolution Date: 03/03/2020 Goal Status: Active Patient/caregiver will verbalize understanding of disease process and disease management Date Initiated: 09/09/2019 Date Inactivated: 10/14/2019 Target Resolution Date: 10/07/2019 Goal Status: Met Interventions: Assess peripheral edema status every visit. Compression as ordered Provide education on venous insufficiency Treatment Activities: Therapeutic compression applied : 09/09/2019 Notes: Wound/Skin Impairment Nursing Diagnoses: Knowledge deficit related to ulceration/compromised skin integrity Goals: Patient/caregiver will verbalize understanding of skin care regimen Date Initiated: 08/04/2019 Target Resolution Date: 02/25/2020  Goal Status: Active Ulcer/skin breakdown will have a volume reduction of 30% by week 4 Date Initiated: 08/04/2019 Date Inactivated: 09/09/2019 Target Resolution Date: 09/11/2019 Goal Status: Met Ulcer/skin breakdown will have a volume reduction of 50% by week 8 Date Initiated: 09/09/2019 Date Inactivated: 10/14/2019 Target Resolution Date: 10/07/2019 Goal Status: Unmet Unmet Reason: awaiting reflux studies Ulcer/skin breakdown will have a volume reduction of 80% by week 12 Date Initiated: 10/14/2019 Date Inactivated: 11/18/2019 Target Resolution Date: 11/04/2019 Goal Status: Unmet Unmet Reason: uncontrolled htn Interventions: Assess patient/caregiver ability to obtain necessary supplies Assess patient/caregiver ability to perform ulcer/skin care regimen upon admission and as needed Assess ulceration(s) every visit Notes: Electronic Signature(s) Signed: 02/10/2020 5:51:45 PM By: [name] Gouty RN, BSN Entered By: [name] on 02/10/2020 08:36:13 -------------------------------------------------------------------------------- Pain Assessment Details Patient Name: Date of Service: [name], [name]  02/10/2020 8:00 A M Medical Record Number: [id number] Patient Account Number: [id number] Date of Birth/Sex: Treating RN: 1965/05/05 (54 y.o. [name] Primary Care Tinzlee Craker: [name] Other Clinician: Referring Andrika Peraza: Treating Kesler Wickham/Extender: [name], [name] in Treatment: 27 Active Problems Location of Pain Severity and Description of Pain Patient Has Paino Yes Site Locations Pain Location: Pain Location: Pain in Ulcers With Dressing Change: Yes Duration of the Pain. Constant / Intermittento Constant Rate the pain. Current Pain Level: 9 Character of Pain Describe the Pain: Throbbing Pain Management and Medication Current Pain Management: Medication: Yes Cold Application: No Rest: No Massage: No Activity: No T.E.N.S.: No Heat Application: No Leg drop or elevation: No Is the Current Pain Management Adequate: Adequate How does your wound impact your activities of daily livingo Sleep: No Bathing: No Appetite: No Relationship With Others: No Bladder Continence: No Emotions: No Bowel Continence: No Work: No Toileting: No Drive: No Dressing: No Hobbies: No Engineer, maintenance) Signed: 02/11/2020 5:54:11 PM By: Levan Hurst RN, BSN Entered By: Levan Hurst on 02/10/2020 08:07:05 -------------------------------------------------------------------------------- Patient/Caregiver Education Details Patient Name: Date of Service: [name], [name] 11/3/2021andnbsp8:00 A M Medical Record Number: [id number] Patient Account Number: [id number] Date of Birth/Gender: Treating RN: 1965/04/10 (54 y.o. [name] Primary Care Physician: [name] Other Clinician: Referring Physician: Treating Physician/Extender: Burnard Hawthorne in Treatment: 27 Education Assessment Education Provided To: Patient Education Topics Provided Venous: Methods: Explain/Verbal Responses: Reinforcements needed, State content  correctly Wound Debridement: Methods: Explain/Verbal Responses: Reinforcements needed, State content correctly Wound/Skin Impairment: Methods: Explain/Verbal Responses: Reinforcements needed, State content correctly Electronic Signature(s) Signed: 02/10/2020 5:51:45 PM By: [name] Gouty RN, BSN Entered By: [name] on 02/10/2020 08:36:41 -------------------------------------------------------------------------------- Wound Assessment Details Patient Name: Date of Service: [name], [name] 02/10/2020 8:00 [place] Record Number: [id number] Patient Account Number: [id number] Date of Birth/Sex: Treating RN: [birth year] (54 y.o. [name] Primary Care Aldena Worm: [name] Other Clinician: Referring Brinson Tozzi: Treating Goldman Birchall/Extender: [name], [name] in Treatment: 27 Wound Status Wound Number: 2 Primary Venous Leg Ulcer Etiology: Wound Location: Right, Anterior Lower Leg Wound Open Wounding Event: Trauma Status: Date Acquired: 11/30/2019 Comorbid Lymphedema, Sleep Apnea, Arrhythmia, Congestive Heart Failure, [name] Of Treatment: 10 History: Hypertension, Peripheral Arterial Disease, Type II Diabetes Clustered Wound: No Wound Measurements Length: (cm) 7 Width: (cm) 6.1 Depth: (cm) 1 Area: (cm) 33.537 Volume: (cm) 33.537 % Reduction in Area: -535.4% % Reduction in Volume: -6251.7% Epithelialization: Small (1-33%) Tunneling: No Undermining: No Wound Description Classification: Full Thickness Without Exposed Support Structures Wound Margin: Flat and Intact Exudate Amount: Medium Exudate Type: Purulent Exudate Color:  yellow, brown, green Foul Odor After Cleansing: No Slough/Fibrino Yes Wound Bed Granulation Amount: Medium (34-66%) Exposed Structure Granulation Quality: Pink Fascia Exposed: No Necrotic Amount: Medium (34-66%) Fat Layer (Subcutaneous Tissue) Exposed: Yes Necrotic Quality: Adherent Slough Tendon Exposed: No Muscle  Exposed: No Joint Exposed: No Bone Exposed: No Treatment Notes Wound #2 (Right, Anterior Lower Leg) 1. Cleanse With Wound Cleanser 3. Primary Dressing Applied Calcium Alginate Santyl 4. Secondary Dressing ABD Pad Roll Gauze 5. Secured With Medipore tape Notes Horticulturist, commercial) Signed: 02/11/2020 5:54:11 PM By: Levan Hurst RN, BSN Entered By: Levan Hurst on 02/10/2020 08:11:08 -------------------------------------------------------------------------------- Wound Assessment Details Patient Name: Date of Service: [name], [name] 02/10/2020 8:00 A M Medical Record Number: [id number] Patient Account Number: [id number] Date of Birth/Sex: Treating RN: 1966/03/28 (54 y.o. [name] Primary Care Lynetta Tomczak: [name] Other Clinician: Referring Jossue Rubenstein: Treating Dasja Brase/Extender: [name], [name] in Treatment: 27 Wound Status Wound Number: 3 Primary Venous Leg Ulcer Etiology: Wound Location: Right, Proximal, Anterior Lower Leg Wound Open Wounding Event: Gradually Appeared Status: Date Acquired: 02/10/2020 Comorbid Lymphedema, Sleep Apnea, Arrhythmia, Congestive Heart Failure, [name] Of Treatment: 0 History: Hypertension, Peripheral Arterial Disease, Type II Diabetes Clustered Wound: No Wound Measurements Length: (cm) 1.5 Width: (cm) 3 Depth: (cm) 0.1 Area: (cm) 3.534 Volume: (cm) 0.353 % Reduction in Area: % Reduction in Volume: Epithelialization: None Tunneling: No Undermining: No Wound Description Classification: Full Thickness Without Exposed Support Structures Wound Margin: Flat and Intact Exudate Amount: Medium Exudate Type: Serosanguineous Exudate Color: red, brown Foul Odor After Cleansing: No Slough/Fibrino Yes Wound Bed Granulation Amount: Medium (34-66%) Exposed Structure Granulation Quality: Red Fascia Exposed: No Necrotic Amount: Medium (34-66%) Fat Layer (Subcutaneous Tissue) Exposed: Yes Necrotic  Quality: Adherent Slough Tendon Exposed: No Muscle Exposed: No Joint Exposed: No Bone Exposed: No Treatment Notes Wound #3 (Right, Proximal, Anterior Lower Leg) 1. Cleanse With Wound Cleanser 3. Primary Dressing Applied Santyl 4. Secondary Dressing ABD Pad Roll Gauze 5. Secured With Medipore tape Notes netting. Electronic Signature(s) Signed: 02/11/2020 5:54:11 PM By: Levan Hurst RN, BSN Signed: 02/11/2020 5:54:11 PM By: Levan Hurst RN, BSN Entered By: Levan Hurst on 02/10/2020 08:10:33 -------------------------------------------------------------------------------- Vitals Details Patient Name: Date of Service: [name], [name] 02/10/2020 8:00 A M Medical Record Number: [id number] Patient Account Number: [id number] Date of Birth/Sex: Treating RN: 21-Mar-1966 (54 y.o. [name] Primary Care Johntavius Shepard: [name] Other Clinician: Referring Isella Slatten: Treating Kawana Hegel/Extender: [name], [name] in Treatment: 27 Vital Signs Time Taken: 08:05 Temperature (F): 98.2 Weight (lbs): 256 Pulse (bpm): 79 Respiratory Rate (breaths/min): 18 Blood Pressure (mmHg): 162/98 Capillary Blood Glucose (mg/dl): 78 Reference Range: 80 - 120 mg / dl Notes glucose per pt report Electronic Signature(s) Signed: 02/11/2020 5:54:11 PM By: Levan Hurst RN, BSN Entered By: Levan Hurst on 02/10/2020 08:06:39

## 2020-02-17 ENCOUNTER — Other Ambulatory Visit: Payer: Self-pay

## 2020-02-17 ENCOUNTER — Encounter (HOSPITAL_COMMUNITY): Payer: Medicare Other

## 2020-02-17 ENCOUNTER — Encounter (HOSPITAL_BASED_OUTPATIENT_CLINIC_OR_DEPARTMENT_OTHER): Payer: Medicare Other | Admitting: Physician Assistant

## 2020-02-17 DIAGNOSIS — G4730 Sleep apnea, unspecified: Secondary | ICD-10-CM | POA: Diagnosis not present

## 2020-02-17 DIAGNOSIS — I5032 Chronic diastolic (congestive) heart failure: Secondary | ICD-10-CM | POA: Diagnosis not present

## 2020-02-17 DIAGNOSIS — I129 Hypertensive chronic kidney disease with stage 1 through stage 4 chronic kidney disease, or unspecified chronic kidney disease: Secondary | ICD-10-CM | POA: Diagnosis not present

## 2020-02-17 DIAGNOSIS — N184 Chronic kidney disease, stage 4 (severe): Secondary | ICD-10-CM | POA: Diagnosis not present

## 2020-02-17 DIAGNOSIS — I872 Venous insufficiency (chronic) (peripheral): Secondary | ICD-10-CM | POA: Diagnosis not present

## 2020-02-17 DIAGNOSIS — E1122 Type 2 diabetes mellitus with diabetic chronic kidney disease: Secondary | ICD-10-CM | POA: Diagnosis not present

## 2020-02-17 DIAGNOSIS — L97812 Non-pressure chronic ulcer of other part of right lower leg with fat layer exposed: Secondary | ICD-10-CM | POA: Diagnosis not present

## 2020-02-17 DIAGNOSIS — E1140 Type 2 diabetes mellitus with diabetic neuropathy, unspecified: Secondary | ICD-10-CM | POA: Diagnosis not present

## 2020-02-17 DIAGNOSIS — E11622 Type 2 diabetes mellitus with other skin ulcer: Secondary | ICD-10-CM | POA: Diagnosis not present

## 2020-02-17 DIAGNOSIS — N182 Chronic kidney disease, stage 2 (mild): Secondary | ICD-10-CM | POA: Diagnosis not present

## 2020-02-17 DIAGNOSIS — I890 Lymphedema, not elsewhere classified: Secondary | ICD-10-CM | POA: Diagnosis not present

## 2020-02-17 DIAGNOSIS — I130 Hypertensive heart and chronic kidney disease with heart failure and stage 1 through stage 4 chronic kidney disease, or unspecified chronic kidney disease: Secondary | ICD-10-CM | POA: Diagnosis not present

## 2020-02-17 NOTE — Progress Notes (Addendum)
Frankowski, LAYDON ([id number]) Visit Report for 02/17/2020 Chief Complaint Document Details Patient Name: Date of Service: [name], [name] 02/17/2020 8:00 A M Medical Record Number: [id number] Patient Account Number: [id number] Date of Birth/Sex: Treating RN: [birth year] (54 y.o. [name] Primary Care Provider: Precious Haws Other Clinician: Referring Provider: Treating Provider/Extender: [name], [name] in Treatment: 28 Information Obtained from: Patient Chief Complaint 08/04/2019; patient is here for review of the wound on the right lateral lower leg Electronic Signature(s) Signed: 02/17/2020 8:17:51 AM By: Worthy Keeler PA-C Entered By: Worthy Keeler on 02/17/2020 08:17:51 -------------------------------------------------------------------------------- Debridement Details Patient Name: Date of Service: [name], [name] 02/17/2020 8:00 A M Medical Record Number: [id number] Patient Account Number: [id number] Date of Birth/Sex: Treating RN: [birth year] (54 y.o. [name] Primary Care Provider: Precious Haws Other Clinician: Referring Provider: Treating Provider/Extender: Katheran Awe Weeks in Treatment: 28 Debridement Performed for Assessment: Wound #2 Right,Anterior Lower Leg Performed By: Clinician Baruch Gouty, RN Debridement Type: Chemical/Enzymatic/Mechanical Agent Used: Santyl Severity of Tissue Pre Debridement: Fat layer exposed Level of Consciousness (Pre-procedure): Awake and Alert Pre-procedure Verification/Time Out Yes - 08:30 Taken: Bleeding: None Response to Treatment: Procedure was tolerated well Level of Consciousness (Post- Awake and Alert procedure): Post Debridement Measurements of Total Wound Length: (cm) 7 Width: (cm) 55 Depth: (cm) 1 Volume: (cm) 302.378 Character of Wound/Ulcer Post Debridement: Requires Further Debridement Severity of Tissue Post Debridement: Fat layer exposed Post Procedure  Diagnosis Same as Pre-procedure Electronic Signature(s) Signed: 02/17/2020 5:47:40 PM By: Worthy Keeler PA-C Signed: 02/17/2020 5:57:36 PM By: Baruch Gouty RN, BSN Entered By: Baruch Gouty on 02/17/2020 08:30:21 -------------------------------------------------------------------------------- Debridement Details Patient Name: Date of Service: [name], [name] 02/17/2020 8:00 A M Medical Record Number: [id number] Patient Account Number: [id number] Date of Birth/Sex: Treating RN: May 01, 1965 (54 y.o. [name] Primary Care Provider: Precious Haws Other Clinician: Referring Provider: Treating Provider/Extender: Katheran Awe Weeks in Treatment: 28 Debridement Performed for Assessment: Wound #3 Right,Proximal,Anterior Lower Leg Performed By: Clinician Baruch Gouty, RN Debridement Type: Chemical/Enzymatic/Mechanical Agent Used: Santyl Severity of Tissue Pre Debridement: Fat layer exposed Level of Consciousness (Pre-procedure): Awake and Alert Pre-procedure Verification/Time Out Yes - 08:30 Taken: Bleeding: None Response to Treatment: Procedure was tolerated well Level of Consciousness (Post- Awake and Alert procedure): Post Debridement Measurements of Total Wound Length: (cm) 1 Width: (cm) 3.5 Depth: (cm) 0.1 Volume: (cm) 0.275 Character of Wound/Ulcer Post Debridement: Requires Further Debridement Severity of Tissue Post Debridement: Fat layer exposed Post Procedure Diagnosis Same as Pre-procedure Electronic Signature(s) Signed: 02/17/2020 5:47:40 PM By: Worthy Keeler PA-C Signed: 02/17/2020 5:57:36 PM By: Baruch Gouty RN, BSN Entered By: Baruch Gouty on 02/17/2020 08:31:39 -------------------------------------------------------------------------------- HPI Details Patient Name: Date of Service: [name], [name] 02/17/2020 8:00 A M Medical Record Number: [id number] Patient Account Number: [id number] Date of Birth/Sex: Treating  RN: 08/19/1965 (54 y.o. [name] Primary Care Provider: Precious Haws Other Clinician: Referring Provider: Treating Provider/Extender: [name], [name] in Treatment: 28 History of Present Illness HPI Description: ADMISSION 08/04/2019 This is a 54 year old man who traumatized his right lateral lower leg while going up an escalator at the mall sometime in December 2020. He was referred to vascular surgery and on 04/14/2019 he saw Dr. Wenda Overland. He was felt to have "palpable pulses and normal noninvasive studies" although I cannot see these results. He was back in his primary doctor's office on 07/30/2019 for a nonhealing  right lower extremity wound. He was referred to wound care I think in [facility] but his insurance was out of network. A CNS was ordered. X-ray was done that was negative. He has been using wound cleanser and a Band-Aid. Past medical history is actually quite extensive and includes chronic kidney disease stage IV, chronic diastolic heart failure, atrial fibrillation, poorly controlled hypertension, poorly controlled diabetes with peripheral neuropathy and a recent hemoglobin A1c of 11.5, depression, lymphedema and sleep apnea ABI in our clinic was 1.1 in the right 5/6; this wound was initially traumatized while the patient was going down on an escalator. Arrived in clinic last week with a completely nonviable surface. He required mechanical debridement and we have been using Iodoflex under compression. Apparently the compression wraps fell down. He also has poorly controlled diabetes 5/15; traumatic/contusion wound to the right lateral lower leg. We have been using Iodoflex to clean at the surface of this which was completely nonviable along with mechanical debridements. He is under compression. 09/02/2019 upon evaluation today patient appears to be doing well with regard to his wound on his right lateral leg. He has been tolerating the dressing  changes without complication. Fortunately there is no signs of active infection at this time. No fevers, chills, nausea, vomiting, or diarrhea. With that being said there is some necrotic tissue around the edges of the wound that is and require some sharp debridement today. That was discussed with the patient and his mother in the office today prior to debridement to which she did consent. 09/09/2019 upon evaluation today patient appears to be making progress in regard to his wound. Fortunately the wound is not nearly as deep as what it was. It is measuring a little bit larger due to having to clear some of the edges away but again that is necessary to get this to heal. Fortunately there is no evidence of active infection at this time. No fevers, chills, nausea, vomiting, or diarrhea. 09/16/2019 on evaluation today patient appears to be doing excellent in regard to his wound currently. In fact I do not see anything that we can need to debride today which is great news. The wound bed looks healthy and the edges of the wound look like they are doing great. Overall I feel like we are at a good place and headed in the proper direction. 09/23/2019 upon evaluation today patient appears to be doing okay in regard to his wound. This is not measuring any smaller it is about the same. With that being said he tells me that his wrap actually slid down he ended up having to take this off he was on a trip and did not have his Velcro compression with him. With that being said I do not see any signs of anything significantly worsening which is good news. 09/30/2019 upon evaluation today patient appears to be doing a little bit better in regard to his leg ulcer. He has been tolerating the dressing changes without complication. Fortunately there is no signs of active infection at this time. No fevers, chills, nausea, vomiting, or diarrhea. 10/14/2019 upon evaluation today patient's wound appears to be healthier. Overall but  unfortunately is measuring a little bit bigger. With that being said I do think that we do need to still continue to wrap his leg. Fortunately there is no signs of active infection at this time which is great news. 11/18/2019 upon evaluation today patient actually appears to be doing decently well with regard to his wound. He  has been tolerating the dressing changes without complication. Fortunately there is no signs of active infection has been using just Neosporin at this point since he was in the hospital. He did have a venous Doppler study in the hospital though I cannot see the results that was from November 04, 2019. Nonetheless overall I am pleased with the fact the patient does seem to be doing better. 11/25/2019 on evaluation today patient appears to be doing well with regard to his wounds. He has been tolerating the dressing changes without complication. Fortunately there is no signs of active infection at this time. No fevers, chills, nausea, vomiting, or diarrhea. I do believe that he does not appear to be showing any signs of worsening although he also seems to be having some trouble getting this to proceed in a good fashion towards healing. 12/02/2019 on evaluation today patient actually appears to be doing not quite as well in regard to his wounds. Both have some slough noted on the surface of the wound. Unfortunately he is continuing to have some issues here with pain as well in the anterior portion which I think is an issue. Fortunately there is no signs of active infection at this time which is great news. No fevers, chills, nausea, vomiting, or diarrhea. 12/30/2019 upon evaluation today patient unfortunately has not been seen in the past month we are just now seeing him back for reevaluation today last time I saw him was August 25. At that point he had a lateral wound on his leg and an anterior wound which which is starting. The lateral has healed unfortunately the anterior looks worse. With  that being said the patient unfortunately seems to be having some issues today that I cannot completely explain by just the way the wound appears. The wound itself is not really hot to touch and to be honest I would not necessarily assume infection upon initial inspection. With that being said he is acting very lethargic, was weak walking into the clinic according to his mother who is with him today, his blood pressure is actually in a normal range at around 110/73 which is unusual for him he normally runs extremely high in the 601U for systolic. His blood glucose was 227 here in the clinic today respiratory rate was 18 and his temperature was 97.7. Pulse was right around 70 when I checked this manually. Nonetheless although his vital signs and normal circumstances would be reassuring I am still kind of concerned just because of the way he is acting. I did question him about medications that he has taken he told me that he took 2 Tylenol and tramadol this morning he also tells me he took 2 tramadol last night. With that being said I do not believe that just taking the tramadol is accounting for what is going on as well based on what I see today. I discussed all this with his mother as well as the patient face-to-face in the office today and subsequently I think I would recommend further evaluation at the ER as I really feel like there is something going on here that I cannot identify just here in the clinic. 01/13/2020 upon evaluation today patient presents for follow-up concerning ongoing issues with his right leg ulcer. After I last saw him 2 weeks ago he was actually sent to the ER where subsequently he ended up being placed on dialysis. He is doing better although he still seems very tired I think still in general he is doing  much better. Fortunately there is no signs of active infection at this time. No fevers, chills, nausea, vomiting, or diarrhea. 01/26/2020; this is a patient with a large  irregular albeit superficial wound on the right anterior lower leg. He has been using Santyl. He has recently been initiated on dialysis after we sent him to the ER earlier this month. He describes the wound is being very painful. By his description this started as a small raised area that rapidly expanded. He does not have a lot of obvious venous issues. Looking back through the records this was described as initially traumatic in April. He has seen vascular surgery and not felt to have an arterial issue 02/03/2020 upon evaluation today patient actually appears to be doing a little better in regard to his ulcer although it has spread inferiorly the central portion of the wound and towards the upper has actually new skin growth. Fortunately there is no signs of active infection at this time which is great news. 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 With that being said some of the more significant issues here such as angiosarcoma does not appear to be likely based on pathology report. It seems that were more in the arterial or possible calciphylaxis realm based on what I am seeing visually. 02/10/2020 on evaluation today patient actually making some progress in regard to his leg ulcer. There is a lot of new skin growth which is excellent he tells me he still having a lot of pain but in general this seems to be showing signs of dramatic improvement which is great news. Overall I think that he is continuing to tolerate the Santyl along with ABD pad and roll gauze to secure. 02/17/2020 on evaluation today patient appears to be doing well in regard to his wound.  He has been tolerating the dressing changes without complication. Fortunately there is no signs of active infection at this time which is great news and overall very pleased with where he stands today. Electronic Signature(s) Signed: 02/17/2020 8:32:52 AM By: Worthy Keeler PA-C Entered By: Worthy Keeler on 02/17/2020 08:32:52 -------------------------------------------------------------------------------- Physical Exam Details Patient Name: Date of Service: [name], [name] 02/17/2020 8:00 A M Medical Record Number: [id number] Patient Account Number: [id number] Date of Birth/Sex: Treating RN: February 18, 1966 (54 y.o. [name] Primary Care Provider: Precious Haws Other Clinician: Referring Provider: Treating Provider/Extender: [name], [name] in Treatment: 35 Constitutional Well-nourished and well-hydrated in no acute distress. Respiratory normal breathing without difficulty. Psychiatric this patient is able to make decisions and demonstrates good insight into disease process. Alert and Oriented x 3. pleasant and cooperative. Notes Patient's wound bed actually showed signs of good granulation epithelization at this point there does not appear to be any evidence of active infection which is great news and overall I feel like he is making good progress. He still using the Santyl I think that still appropriate. Electronic Signature(s) Signed: 02/17/2020 8:33:08 AM By: Worthy Keeler PA-C Entered By: Worthy Keeler on 02/17/2020 08:33:08 -------------------------------------------------------------------------------- Physician Orders Details Patient Name: Date of Service: [name], [name] 02/17/2020 8:00 A M Medical Record Number: [id number] Patient Account Number: [id number] Date of Birth/Sex: Treating RN: 1965-09-18 (54 y.o. M)  Baruch Gouty Primary Care Provider: Precious Haws Other Clinician: Referring Provider: Treating Provider/Extender: [name], [name] in Treatment: 34 Verbal / Phone Orders: No Diagnosis Coding ICD-10 Coding Code Description S80.811D Abrasion, right lower leg, subsequent encounter L97.812 Non-pressure chronic ulcer of other part of right lower leg with fat layer exposed E11.622 Type 2 diabetes mellitus with other skin ulcer I89.0 Lymphedema, not elsewhere classified I87.2 Venous insufficiency (chronic) (peripheral) Follow-up Appointments ppointment in 1 week. - Wednesday Return A Dressing Change Frequency Wound #2 Right,Anterior Lower Leg Change dressing every day. Wound #3 Right,Proximal,Anterior Lower Leg Change dressing every day. Skin Barriers/Peri-Wound Care Moisturizing lotion - to leg daily Wound Cleansing May shower and wash wound with soap and water. - use Dial antibacterial soap Primary Wound Dressing Wound #2 Right,Anterior Lower Leg lginate - pack strip lightly into hole medial aspect of ulcer with santyl at base of hole Calcium A Santyl Ointment Wound #3 Right,Proximal,Anterior Lower Leg Santyl Ointment Secondary Dressing Wound #2 Right,Anterior Lower Leg Kerlix/Rolled Gauze Dry Gauze ABD pad Edema Control Avoid standing for long periods of time Elevate legs to the level of the heart or above for 30 minutes daily and/or when sitting, a frequency of: - throughout the day Exercise regularly Electronic Signature(s) Signed: 02/17/2020 5:47:40 PM By: Worthy Keeler PA-C Signed: 02/17/2020 5:57:36 PM By: Baruch Gouty RN, BSN Entered By: Baruch Gouty on 02/17/2020 08:34:17 -------------------------------------------------------------------------------- Problem List Details Patient Name: Date of Service: [name], [name] 02/17/2020 8:00 A M Medical Record Number: [id number] Patient Account Number: [id number] Date of Birth/Sex: Treating RN: 1965-09-09 (54 y.o. [name] Primary Care Provider: Precious Haws Other Clinician: Referring Provider: Treating  Provider/Extender: [name], [name] in Treatment: 28 Active Problems ICD-10 Encounter Code Description Active Date MDM Diagnosis S80.811D Abrasion, right lower leg, subsequent encounter 08/04/2019 No Yes L97.812 Non-pressure chronic ulcer of other part of right lower leg with fat layer 08/04/2019 No Yes exposed E11.622 Type 2 diabetes mellitus with other skin ulcer 08/04/2019 No Yes I89.0 Lymphedema, not elsewhere classified 08/04/2019 No Yes I87.2 Venous insufficiency (chronic) (peripheral) 09/30/2019 No Yes Inactive Problems Resolved Problems Electronic Signature(s) Signed: 02/17/2020 8:17:45 AM By: Worthy Keeler PA-C Entered By: Worthy Keeler on 02/17/2020 08:17:45 -------------------------------------------------------------------------------- Progress Note Details Patient Name: Date of Service: [name], [name] 02/17/2020 8:00 A M Medical Record Number: [id number] Patient Account Number: [id number] Date of Birth/Sex: Treating RN: [birth year] (54 y.o. [name] Primary Care Provider: Precious Haws Other Clinician: Referring Provider: Treating Provider/Extender: [name], [name] in Treatment: 28 Subjective Chief Complaint Information obtained from Patient 08/04/2019; patient is here for review of the wound on the right lateral lower leg History of Present Illness (HPI) ADMISSION 08/04/2019 This is a 54 year old man who traumatized his right lateral lower leg while going [place] an escalator at the mall sometime in December 2020. He was referred to vascular surgery and on 04/14/2019 he saw Dr. Wenda Overland. He was felt to have "palpable pulses and normal noninvasive studies" although I cannot see these results. He was back in his primary doctor's office on 07/30/2019 for a nonhealing right lower extremity wound. He was referred to wound care I think in [facility] but his insurance was out of network. A CNS was ordered. X-ray was done that was  negative. He has been using wound cleanser and a Band-Aid. Past medical history is actually quite extensive and includes chronic kidney disease stage IV, chronic diastolic heart  failure, atrial fibrillation, poorly controlled hypertension, poorly controlled diabetes with peripheral neuropathy and a recent hemoglobin A1c of 11.5, depression, lymphedema and sleep apnea ABI in our clinic was 1.1 in the right 5/6; this wound was initially traumatized while the patient was going down on an escalator. Arrived in clinic last week with a completely nonviable surface. He required mechanical debridement and we have been using Iodoflex under compression. Apparently the compression wraps fell down. He also has poorly controlled diabetes 5/15; traumatic/contusion wound to the right lateral lower leg. We have been using Iodoflex to clean at the surface of this which was completely nonviable along with mechanical debridements. He is under compression. 09/02/2019 upon evaluation today patient appears to be doing well with regard to his wound on his right lateral leg. He has been tolerating the dressing changes without complication. Fortunately there is no signs of active infection at this time. No fevers, chills, nausea, vomiting, or diarrhea. With that being said there is some necrotic tissue around the edges of the wound that is and require some sharp debridement today. That was discussed with the patient and his mother in the office today prior to debridement to which she did consent. 09/09/2019 upon evaluation today patient appears to be making progress in regard to his wound. Fortunately the wound is not nearly as deep as what it was. It is measuring a little bit larger due to having to clear some of the edges away but again that is necessary to get this to heal. Fortunately there is no evidence of active infection at this time. No fevers, chills, nausea, vomiting, or diarrhea. 09/16/2019 on evaluation today patient  appears to be doing excellent in regard to his wound currently. In fact I do not see anything that we can need to debride today which is great news. The wound bed looks healthy and the edges of the wound look like they are doing great. Overall I feel like we are at a good place and headed in the proper direction. 09/23/2019 upon evaluation today patient appears to be doing okay in regard to his wound. This is not measuring any smaller it is about the same. With that being said he tells me that his wrap actually slid down he ended up having to take this off he was on a trip and did not have his Velcro compression with him. With that being said I do not see any signs of anything significantly worsening which is good news. 09/30/2019 upon evaluation today patient appears to be doing a little bit better in regard to his leg ulcer. He has been tolerating the dressing changes without complication. Fortunately there is no signs of active infection at this time. No fevers, chills, nausea, vomiting, or diarrhea. 10/14/2019 upon evaluation today patient's wound appears to be healthier. Overall but unfortunately is measuring a little bit bigger. With that being said I do think that we do need to still continue to wrap his leg. Fortunately there is no signs of active infection at this time which is great news. 11/18/2019 upon evaluation today patient actually appears to be doing decently well with regard to his wound. He has been tolerating the dressing changes without complication. Fortunately there is no signs of active infection has been using just Neosporin at this point since he was in the hospital. He did have a venous Doppler study in the hospital though I cannot see the results that was from November 04, 2019. Nonetheless overall I am pleased with the  fact the patient does seem to be doing better. 11/25/2019 on evaluation today patient appears to be doing well with regard to his wounds. He has been tolerating the  dressing changes without complication. Fortunately there is no signs of active infection at this time. No fevers, chills, nausea, vomiting, or diarrhea. I do believe that he does not appear to be showing any signs of worsening although he also seems to be having some trouble getting this to proceed in a good fashion towards healing. 12/02/2019 on evaluation today patient actually appears to be doing not quite as well in regard to his wounds. Both have some slough noted on the surface of the wound. Unfortunately he is continuing to have some issues here with pain as well in the anterior portion which I think is an issue. Fortunately there is no signs of active infection at this time which is great news. No fevers, chills, nausea, vomiting, or diarrhea. 12/30/2019 upon evaluation today patient unfortunately has not been seen in the past month we are just now seeing him back for reevaluation today last time I saw him was August 25. At that point he had a lateral wound on his leg and an anterior wound which which is starting. The lateral has healed unfortunately the anterior looks worse. With that being said the patient unfortunately seems to be having some issues today that I cannot completely explain by just the way the wound appears. The wound itself is not really hot to touch and to be honest I would not necessarily assume infection upon initial inspection. With that being said he is acting very lethargic, was weak walking into the clinic according to his mother who is with him today, his blood pressure is actually in a normal range at around 110/73 which is unusual for him he normally runs extremely high in the 703J for systolic. His blood glucose was 227 here in the clinic today respiratory rate was 18 and his temperature was 97.7. Pulse was right around 70 when I checked this manually. Nonetheless although his vital signs and normal circumstances would be reassuring I am still kind of concerned just  because of the way he is acting. I did question him about medications that he has taken he told me that he took 2 Tylenol and tramadol this morning he also tells me he took 2 tramadol last night. With that being said I do not believe that just taking the tramadol is accounting for what is going on as well based on what I see today. I discussed all this with his mother as well as the patient face-to-face in the office today and subsequently I think I would recommend further evaluation at the ER as I really feel like there is something going on here that I cannot identify just here in the clinic. 01/13/2020 upon evaluation today patient presents for follow-up concerning ongoing issues with his right leg ulcer. After I last saw him 2 weeks ago he was actually sent to the ER where subsequently he ended up being placed on dialysis. He is doing better although he still seems very tired I think still in general he is doing much better. Fortunately there is no signs of active infection at this time. No fevers, chills, nausea, vomiting, or diarrhea. 01/26/2020; this is a patient with a large irregular albeit superficial wound on the right anterior lower leg. He has been using Santyl. He has recently been initiated on dialysis after we sent him to the ER earlier  this month. He describes the wound is being very painful. By his description this started as a small raised area that rapidly expanded. He does not have a lot of obvious venous issues. Looking back through the records this was described as initially traumatic in April. He has seen vascular surgery and not felt to have an arterial issue 02/03/2020 upon evaluation today patient actually appears to be doing a little better in regard to his ulcer although it has spread inferiorly the central portion of the wound and towards the upper has actually new skin growth. Fortunately there is no signs of active infection at this time which is great news. 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 With that being said some of the more significant issues here such as angiosarcoma does not appear to be likely based on pathology report. It seems that were more in the arterial or possible calciphylaxis realm based on what I am seeing visually. 02/10/2020 on evaluation today patient actually making some progress in regard to his leg ulcer. There is a lot of new skin growth which is excellent he tells me he still having a lot of pain but in general this seems to be showing signs of dramatic improvement which is great news. Overall I think that he is continuing to tolerate the Santyl along with ABD pad and roll gauze to secure. 02/17/2020 on evaluation today patient appears to be doing well in regard to his wound. He has been tolerating the dressing changes without complication. Fortunately there is no signs of active infection at this time which is great news and overall very pleased with where he stands today. Objective Constitutional Well-nourished and well-hydrated in no acute distress. Vitals Time Taken: 8:13 AM, Weight: 256 lbs, Temperature: 98.2 F, Pulse: 74 bpm, Respiratory Rate: 18 breaths/min, Blood Pressure: 129/85 mmHg, Capillary Blood Glucose: 138 mg/dl. General Notes: glucose per pt report Respiratory normal breathing without difficulty. Psychiatric this patient is able to make decisions and demonstrates good insight into disease process. Alert and Oriented x 3. pleasant and cooperative. General Notes: Patient's wound bed actually showed signs of good granulation epithelization at this point there does not  appear to be any evidence of active infection which is great news and overall I feel like he is making good progress. He still using the Santyl I think that still appropriate. Integumentary (Hair, Skin) Wound #2 status is Open. Original cause of wound was Trauma. The wound is located on the Right,Anterior Lower Leg. The wound measures 7cm length x 5.5cm width x 1cm depth; 30.238cm^2 area and 30.238cm^3 volume. There is Fat Layer (Subcutaneous Tissue) exposed. There is no tunneling or undermining noted. There is a medium amount of serosanguineous drainage noted. The wound margin is flat and intact. There is medium (34-66%) pink granulation within the wound bed. There is a medium (34-66%) amount of necrotic tissue within the wound bed including Adherent Slough. Wound #3 status is Open. Original cause of wound was Gradually Appeared. The wound is located on the Right,Proximal,Anterior Lower Leg. The wound measures 1cm length x 3.5cm width x 0.1cm depth; 2.749cm^2 area and 0.275cm^3 volume. There is Fat Layer (Subcutaneous Tissue) exposed. There is no tunneling or undermining noted. There is a medium amount  of serosanguineous drainage noted. The wound margin is flat and intact. There is large (67-100%) red granulation within the wound bed. There is a small (1-33%) amount of necrotic tissue within the wound bed including Adherent Slough. 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 There was a Chemical/Enzymatic/Mechanical debridement performed by Baruch Gouty, RN.Marland Kitchen Agent used  was [employer]. A time out was conducted at 08:30, prior to the start of the procedure. There was no bleeding. The procedure was tolerated well. Post Debridement Measurements: 7cm length x 55cm width x 1cm depth; 302.378cm^3 volume. Character of Wound/Ulcer Post Debridement requires further debridement. Severity of Tissue Post Debridement is: Fat layer exposed. Post procedure Diagnosis Wound #2: Same as Pre-Procedure Wound #3 Pre-procedure diagnosis of Wound #3 is a Venous Leg Ulcer located on the Right,Proximal,Anterior Lower Leg .Severity of Tissue Pre Debridement is: Fat layer exposed. There was a Chemical/Enzymatic/Mechanical debridement performed by Baruch Gouty, RN.Marland Kitchen Agent used was [employer]. A time out was conducted at 08:30, prior to the start of the procedure. There was no bleeding. The procedure was tolerated well. Post Debridement Measurements: 1cm length x 3.5cm width x 0.1cm depth; 0.275cm^3 volume. Character of Wound/Ulcer Post Debridement requires further debridement. Severity of Tissue Post Debridement is: Fat layer exposed. Post procedure Diagnosis Wound #3: Same as Pre-Procedure Plan Follow-up Appointments: Return Appointment in 1 week. - Wednesday Dressing Change Frequency: Wound #2 Right,Anterior Lower Leg: Change dressing every day. Wound #3 Right,Proximal,Anterior Lower Leg: Change dressing every day. Skin Barriers/Peri-Wound Care: Moisturizing lotion - to leg daily Wound Cleansing: May shower and wash wound with soap and water. - use Dial antibacterial soap Primary Wound Dressing: Wound #2 Right,Anterior Lower Leg: Calcium Alginate - pack strip lightly into hole medial aspect of ulcer with santyl at base of hole Santyl Ointment Wound #3 Right,Proximal,Anterior Lower Leg: Santyl Ointment Secondary Dressing: Wound #2 Right,Anterior Lower Leg: Kerlix/Rolled Gauze Dry Gauze ABD pad Edema Control: Avoid standing for long periods of time Elevate legs to the level of  the heart or above for 30 minutes daily and/or when sitting, a frequency of: - throughout the day Exercise regularly 1. I would recommend currently that we continue with the Santyl I think that still the best option for the patient. 2. I would also recommend patient continue to monitor for any signs of worsening infection obviously right now I see no evidence of infection whatsoever but we will keep a close eye on things in this regard. 3. I would also recommend he continue to elevate his legs much as possible try to keep edema under good control. We will see patient back for reevaluation in 1 week here in the clinic. If anything worsens or changes patient will contact our office for additional recommendations. Electronic Signature(s) Signed: 02/17/2020 5:47:40 PM By: Worthy Keeler PA-C Signed: 02/17/2020 5:57:36 PM By: Baruch Gouty RN, BSN Previous Signature: 02/17/2020 8:33:34 AM Version By: Worthy Keeler PA-C Entered By: Baruch Gouty on 02/17/2020 08:34:32 -------------------------------------------------------------------------------- SuperBill Details Patient Name: Date of Service: [name], [name] 02/17/2020 Medical Record Number: [id number] Patient Account Number: [id number] Date of Birth/Sex: Treating RN: March 05, 1966 (54 y.o. [name] Primary  Care Provider: Precious Haws Other Clinician: Referring Provider: Treating Provider/Extender: [name], [name] in Treatment: 28 Diagnosis Coding ICD-10 Codes Code Description S80.811D Abrasion, right lower leg, subsequent encounter L97.812 Non-pressure chronic ulcer of other part of right lower leg with fat layer exposed E11.622 Type 2 diabetes mellitus with other skin ulcer I89.0 Lymphedema, not elsewhere classified I87.2 Venous insufficiency (chronic) (peripheral) Facility Procedures CPT4 Code: [id number] Description: [phone] - DEBRIDE W/O ANES NON SELECT Modifier: Quantity: 1 Physician Procedures :  CPT4 Code Description Modifier [id number] - WC PHYS LEVEL 3 - EST PT ICD-10 Diagnosis Description S80.811D Abrasion, right lower leg, subsequent encounter L97.812 Non-pressure chronic ulcer of other part of right lower leg with fat layer exposed  E11.622 Type 2 diabetes mellitus with other skin ulcer I89.0 Lymphedema, not elsewhere classified Quantity: 1 Electronic Signature(s) Signed: 02/17/2020 5:47:40 PM By: Worthy Keeler PA-C Signed: 02/17/2020 5:57:36 PM By: Baruch Gouty RN, BSN Previous Signature: 02/17/2020 8:33:47 AM Version By: Worthy Keeler PA-C Entered By: Baruch Gouty on 02/17/2020 08:34:55

## 2020-02-18 NOTE — Progress Notes (Signed)
Krass, NIAM ([id number]) Visit Report for 02/17/2020 Arrival Information Details Patient Name: Date of Service: [name], [name] 02/17/2020 8:00 A M Medical Record Number: [id number] Patient Account Number: [id number] Date of Birth/Sex: Treating RN: 1965/08/11 (54 y.o. [name] Primary Care [name]: Precious Haws Other Clinician: Referring Ranvir Renovato: Treating [name]: [name], [name] in Treatment: 28 Visit Information History Since Last Visit Added or deleted any medications: No Patient Arrived: Cane Any new allergies or adverse reactions: No Arrival Time: 08:12 Had a fall or experienced change in No Accompanied By: mother activities of daily living that may affect Transfer Assistance: None risk of falls: Patient Identification Verified: Yes Signs or symptoms of abuse/neglect since last visito No Secondary Verification Process Completed: Yes Hospitalized since last visit: No Patient Requires Transmission-Based Precautions: No Implantable device outside of the clinic excluding No Patient Has Alerts: No cellular tissue based products placed in the center since last visit: Has Dressing in Place as Prescribed: Yes Pain Present Now: Yes Electronic Signature(s) Signed: 02/18/2020 8:28:44 AM By: Levan Hurst RN, BSN Entered By: Levan Hurst on 02/17/2020 08:13:17 -------------------------------------------------------------------------------- Encounter Discharge Information Details Patient Name: Date of Service: [name], [name] 02/17/2020 8:00 A M Medical Record Number: [id number] Patient Account Number: [id number] Date of Birth/Sex: Treating RN: 08/29/1965 (54 y.o. [name]) [name] Primary Care Ersie Savino: Precious Haws Other Clinician: Referring Tanna Loeffler: Treating [name]/Extender: [name], [name] in Treatment: 28 Encounter Discharge Information Items Post Procedure Vitals Discharge Condition: Stable Temperature (F):  98.2 Ambulatory Status: Cane Pulse (bpm): 74 Discharge Destination: Home Respiratory Rate (breaths/min): 18 Transportation: Private Auto Blood Pressure (mmHg): 129/85 Accompanied By: self Schedule Follow-up Appointment: Yes Clinical Summary of Care: Patient Declined Electronic Signature(s) Signed: 02/17/2020 5:51:14 PM By: [name] RN Entered By: [name] on 02/17/2020 08:50:01 -------------------------------------------------------------------------------- Lower Extremity Assessment Details Patient Name: Date of Service: [name], [name] 02/17/2020 8:00 A M Medical Record Number: [id number] Patient Account Number: [id number] Date of Birth/Sex: Treating RN: 18-Aug-1965 (54 y.o. [name] Primary Care Rino Hosea: Precious Haws Other Clinician: Referring Colbi Schiltz: Treating [name]/Extender: [name], [name] in Treatment: 28 Edema Assessment Assessed: [Left: No] [Right: No] Edema: [Left: Ye] [Right: s] Calf Left: Right: Point of Measurement: 31 cm From Medial Instep 40 cm Ankle Left: Right: Point of Measurement: 8 cm From Medial Instep 23 cm Vascular Assessment Pulses: Dorsalis Pedis Palpable: [Right:Yes] Electronic Signature(s) Signed: 02/18/2020 8:28:44 AM By: Levan Hurst RN, BSN Entered By: Levan Hurst on 02/17/2020 08:18:19 -------------------------------------------------------------------------------- [place] Details Patient Name: Date of Service: [name], [name] 02/17/2020 8:00 A M Medical Record Number: [id number] Patient Account Number: [id number] Date of Birth/Sex: Treating RN: 1966-02-09 (54 y.o. [name] Primary Care Annalena Piatt: Precious Haws Other Clinician: Referring Valentina Alcoser: Treating [name]/Extender: [name], [name] in Treatment: 28 Active Inactive Venous Leg Ulcer Nursing Diagnoses: Knowledge deficit related to disease process and management Potential for venous  Insuffiency (use before diagnosis confirmed) Goals: Patient will maintain optimal edema control Date Initiated: 09/09/2019 Target Resolution Date: 03/03/2020 Goal Status: Active Patient/caregiver will verbalize understanding of disease process and disease management Date Initiated: 09/09/2019 Date Inactivated: 10/14/2019 Target Resolution Date: 10/07/2019 Goal Status: Met Interventions: Assess peripheral edema status every visit. Compression as ordered Provide education on venous insufficiency Treatment Activities: Therapeutic compression applied : 09/09/2019 Notes: Wound/Skin Impairment Nursing Diagnoses: Knowledge deficit related to ulceration/compromised skin integrity Goals: Patient/caregiver will verbalize understanding of skin care regimen Date Initiated: 08/04/2019 Target  Resolution Date: 02/25/2020 Goal Status: Active Ulcer/skin breakdown will have a volume reduction of 30% by week 4 Date Initiated: 08/04/2019 Date Inactivated: 09/09/2019 Target Resolution Date: 09/11/2019 Goal Status: Met Ulcer/skin breakdown will have a volume reduction of 50% by week 8 Date Initiated: 09/09/2019 Date Inactivated: 10/14/2019 Target Resolution Date: 10/07/2019 Goal Status: Unmet Unmet Reason: awaiting reflux studies Ulcer/skin breakdown will have a volume reduction of 80% by week 12 Date Initiated: 10/14/2019 Date Inactivated: 11/18/2019 Target Resolution Date: 11/04/2019 Goal Status: Unmet Unmet Reason: uncontrolled htn Interventions: Assess patient/caregiver ability to obtain necessary supplies Assess patient/caregiver ability to perform ulcer/skin care regimen upon admission and as needed Assess ulceration(s) every visit Notes: Electronic Signature(s) Signed: 02/17/2020 5:57:36 PM By: Baruch Gouty RN, BSN Entered By: Baruch Gouty on 02/17/2020 08:26:57 -------------------------------------------------------------------------------- Pain Assessment Details Patient Name: Date of  Service: [name], [name] 02/17/2020 8:00 A M Medical Record Number: [id number] Patient Account Number: [id number] Date of Birth/Sex: Treating RN: [birth year] (54 y.o. [name] Primary Care Rosmarie Esquibel: Precious Haws Other Clinician: Referring [name]: Treating Helen Cuff/Extender: [name], [name] in Treatment: 28 Active Problems Location of Pain Severity and Description of Pain Patient Has Paino Yes Site Locations Pain Location: Pain Location: Pain in Ulcers With Dressing Change: Yes Duration of the Pain. Constant / Intermittento Intermittent Rate the pain. Current Pain Level: 3 Character of Pain Describe the Pain: Dull Pain Management and Medication Current Pain Management: Medication: Yes Cold Application: No Rest: No Massage: No Activity: No T.E.N.S.: No Heat Application: No Leg drop or elevation: No Is the Current Pain Management Adequate: Adequate How does your wound impact your activities of daily livingo Sleep: No Bathing: No Appetite: No Relationship With Others: No Bladder Continence: No Emotions: No Bowel Continence: No Work: No Toileting: No Drive: No Dressing: No Hobbies: No Electronic Signature(s) Signed: 02/18/2020 8:28:44 AM By: Levan Hurst RN, BSN Entered By: Levan Hurst on 02/17/2020 08:13:56 -------------------------------------------------------------------------------- Patient/Caregiver Education Details Patient Name: Date of Service: [name], [name] 11/10/2021andnbsp8:00 A M Medical Record Number: [id number] Patient Account Number: [id number] Date of Birth/Gender: Treating RN: Dec 03, 1965 (54 y.o. [name] Primary Care Physician: Precious Haws Other Clinician: Referring Physician: Treating Physician/Extender: Burnard Hawthorne in Treatment: 28 Education Assessment Education Provided To: Patient Education Topics Provided Venous: Methods: Explain/Verbal Responses: Reinforcements  needed, State content correctly Wound/Skin Impairment: Methods: Explain/Verbal Responses: Reinforcements needed, State content correctly Electronic Signature(s) Signed: 02/17/2020 5:57:36 PM By: Baruch Gouty RN, BSN Entered By: Baruch Gouty on 02/17/2020 08:27:20 -------------------------------------------------------------------------------- Wound Assessment Details Patient Name: Date of Service: [name], [name] 02/17/2020 8:00 A M Medical Record Number: [id number] Patient Account Number: [id number] Date of Birth/Sex: Treating RN: 1965-04-27 (54 y.o. [name] Primary Care Linsy Ehresman: Precious Haws Other Clinician: Referring Mikaella Escalona: Treating Jamisen Hawes/Extender: [name], [name] in Treatment: 28 Wound Status Wound Number: 2 Primary Venous Leg Ulcer Etiology: Wound Location: Right, Anterior Lower Leg Wound Open Wounding Event: Trauma Status: Date Acquired: 11/30/2019 Comorbid Lymphedema, Sleep Apnea, Arrhythmia, Congestive Heart Failure, Weeks Of Treatment: 11 History: Hypertension, Peripheral Arterial Disease, Type II Diabetes Clustered Wound: No Wound Measurements Length: (cm) 7 Width: (cm) 5.5 Depth: (cm) 1 Area: (cm) 30.238 Volume: (cm) 30.238 % Reduction in Area: -472.9% % Reduction in Volume: -5626.9% Epithelialization: Small (1-33%) Tunneling: No Undermining: No Wound Description Classification: Full Thickness Without Exposed Support Structures Wound Margin: Flat and Intact Exudate Amount: Medium Exudate Type: Serosanguineous Exudate Color: red, brown Foul Odor After Cleansing: No  Slough/Fibrino Yes Wound Bed Granulation Amount: Medium (34-66%) Exposed Structure Granulation Quality: Pink Fascia Exposed: No Necrotic Amount: Medium (34-66%) Fat Layer (Subcutaneous Tissue) Exposed: Yes Necrotic Quality: Adherent Slough Tendon Exposed: No Muscle Exposed: No Joint Exposed: No Bone Exposed: No Treatment Notes Wound #2 (Right,  Anterior Lower Leg) 1. Cleanse With Wound Cleanser 2. Periwound Care Moisturizing lotion 3. Primary Dressing Applied Calcium Alginate Santyl 4. Secondary Dressing ABD Pad Dry Gauze Roll Gauze 5. Secured With Recruitment consultant) Signed: 02/18/2020 8:28:44 AM By: Levan Hurst RN, BSN Signed: 02/18/2020 8:28:44 AM By: Levan Hurst RN, BSN Entered By: Levan Hurst on 02/17/2020 08:19:47 -------------------------------------------------------------------------------- Wound Assessment Details Patient Name: Date of Service: [name], [name] 02/17/2020 8:00 A M Medical Record Number: [id number] Patient Account Number: [id number] Date of Birth/Sex: Treating RN: [birth year] (54 y.o. [name] Primary Care Lyrik Buresh: Precious Haws Other Clinician: Referring Traeton Bordas: Treating Axten Pascucci/Extender: [name], [name] in Treatment: 28 Wound Status Wound Number: 3 Primary Venous Leg Ulcer Etiology: Wound Location: Right, Proximal, Anterior Lower Leg Wound Open Wounding Event: Gradually Appeared Status: Date Acquired: 02/10/2020 Comorbid Lymphedema, Sleep Apnea, Arrhythmia, Congestive Heart Failure, Weeks Of Treatment: 1 History: Hypertension, Peripheral Arterial Disease, Type II Diabetes Clustered Wound: No Wound Measurements Length: (cm) 1 Width: (cm) 3.5 Depth: (cm) 0.1 Area: (cm) 2.749 Volume: (cm) 0.275 % Reduction in Area: 22.2% % Reduction in Volume: 22.1% Epithelialization: Medium (34-66%) Tunneling: No Undermining: No Wound Description Classification: Full Thickness Without Exposed Support Struct Wound Margin: Flat and Intact Exudate Amount: Medium Exudate Type: Serosanguineous Exudate Color: red, brown ures Foul Odor After Cleansing: No Slough/Fibrino Yes Wound Bed Granulation Amount: Large (67-100%) Exposed Structure Granulation Quality: Red Fascia Exposed: No Necrotic Amount: Small (1-33%) Fat Layer (Subcutaneous Tissue)  Exposed: Yes Necrotic Quality: Adherent Slough Tendon Exposed: No Muscle Exposed: No Joint Exposed: No Bone Exposed: No Treatment Notes Wound #3 (Right, Proximal, Anterior Lower Leg) 1. Cleanse With Wound Cleanser 2. Periwound Care Moisturizing lotion 3. Primary Dressing Applied Calcium Alginate Santyl 4. Secondary Dressing ABD Pad Dry Gauze Roll Gauze 5. Secured With Recruitment consultant) Signed: 02/18/2020 8:28:44 AM By: Levan Hurst RN, BSN Entered By: Levan Hurst on 02/17/2020 08:20:02 -------------------------------------------------------------------------------- Vitals Details Patient Name: Date of Service: [name], [name] 02/17/2020 8:00 A M Medical Record Number: [id number] Patient Account Number: [id number] Date of Birth/Sex: Treating RN: Feb 08, 1966 (54 y.o. [name] Primary Care Adoni Greenough: Precious Haws Other Clinician: Referring Kashayla Ungerer: Treating Geneal Huebert/Extender: [name], [name] in Treatment: 28 Vital Signs Time Taken: 08:13 Temperature (F): 98.2 Weight (lbs): 256 Pulse (bpm): 74 Respiratory Rate (breaths/min): 18 Blood Pressure (mmHg): 129/85 Capillary Blood Glucose (mg/dl): 138 Reference Range: 80 - 120 mg / dl Notes glucose per pt report Electronic Signature(s) Signed: 02/18/2020 8:28:44 AM By: Levan Hurst RN, BSN Entered By: Levan Hurst on 02/17/2020 08:13:40

## 2020-02-24 ENCOUNTER — Encounter (HOSPITAL_BASED_OUTPATIENT_CLINIC_OR_DEPARTMENT_OTHER): Payer: Medicare Other | Admitting: Physician Assistant

## 2020-02-24 ENCOUNTER — Other Ambulatory Visit: Payer: Self-pay

## 2020-02-24 DIAGNOSIS — I129 Hypertensive chronic kidney disease with stage 1 through stage 4 chronic kidney disease, or unspecified chronic kidney disease: Secondary | ICD-10-CM | POA: Diagnosis not present

## 2020-02-24 DIAGNOSIS — N184 Chronic kidney disease, stage 4 (severe): Secondary | ICD-10-CM | POA: Diagnosis not present

## 2020-02-24 DIAGNOSIS — E11622 Type 2 diabetes mellitus with other skin ulcer: Secondary | ICD-10-CM | POA: Diagnosis not present

## 2020-02-24 DIAGNOSIS — I5032 Chronic diastolic (congestive) heart failure: Secondary | ICD-10-CM | POA: Diagnosis not present

## 2020-02-24 DIAGNOSIS — I872 Venous insufficiency (chronic) (peripheral): Secondary | ICD-10-CM | POA: Diagnosis not present

## 2020-02-24 DIAGNOSIS — I130 Hypertensive heart and chronic kidney disease with heart failure and stage 1 through stage 4 chronic kidney disease, or unspecified chronic kidney disease: Secondary | ICD-10-CM | POA: Diagnosis not present

## 2020-02-24 DIAGNOSIS — E1140 Type 2 diabetes mellitus with diabetic neuropathy, unspecified: Secondary | ICD-10-CM | POA: Diagnosis not present

## 2020-02-24 DIAGNOSIS — I890 Lymphedema, not elsewhere classified: Secondary | ICD-10-CM | POA: Diagnosis not present

## 2020-02-24 DIAGNOSIS — E1122 Type 2 diabetes mellitus with diabetic chronic kidney disease: Secondary | ICD-10-CM | POA: Diagnosis not present

## 2020-02-24 DIAGNOSIS — G4730 Sleep apnea, unspecified: Secondary | ICD-10-CM | POA: Diagnosis not present

## 2020-02-24 DIAGNOSIS — L97812 Non-pressure chronic ulcer of other part of right lower leg with fat layer exposed: Secondary | ICD-10-CM | POA: Diagnosis not present

## 2020-02-24 DIAGNOSIS — N182 Chronic kidney disease, stage 2 (mild): Secondary | ICD-10-CM | POA: Diagnosis not present

## 2020-02-24 NOTE — Progress Notes (Addendum)
Curbow, LEIF ([id number]) Visit Report for 02/24/2020 Chief Complaint Document Details Patient Name: Date of Service: [name], [name] 02/24/2020 8:00 A M Medical Record Number: [id number] Patient Account Number: [id number] Date of Birth/Sex: Treating RN: December 10, 1965 (54 y.o. [name] Primary Care Provider: Precious Haws Other Clinician: Referring Provider: Treating Provider/Extender: [name], [name] in Treatment: 29 Information Obtained from: Patient Chief Complaint 08/04/2019; patient is here for review of the wound on the right lateral lower leg Electronic Signature(s) Signed: 02/24/2020 7:53:30 AM By: Worthy Keeler PA-C Entered By: Worthy Keeler on 02/24/2020 07:53:30 -------------------------------------------------------------------------------- Debridement Details Patient Name: Date of Service: [name], [name] 02/24/2020 8:00 A M Medical Record Number: [id number] Patient Account Number: [id number] Date of Birth/Sex: Treating RN: 04-Sep-1965 (54 y.o. [name] Primary Care Provider: Precious Haws Other Clinician: Referring Provider: Treating Provider/Extender: Katheran Awe Weeks in Treatment: 29 Debridement Performed for Assessment: Wound #2 Right,Anterior Lower Leg Performed By: Jake Church, RN Debridement Type: Chemical/Enzymatic/Mechanical Agent Used: Santyl Severity of Tissue Pre Debridement: Fat layer exposed Level of Consciousness (Pre-procedure): Awake and Alert Pre-procedure Verification/Time Out Yes - 08:23 Taken: Bleeding: None Response to Treatment: Procedure was tolerated well Level of Consciousness (Post- Awake and Alert procedure): Post Debridement Measurements of Total Wound Length: (cm) 8.5 Width: (cm) 5.5 Depth: (cm) 0.7 Volume: (cm) 25.702 Character of Wound/Ulcer Post Debridement: Requires Further Debridement Severity of Tissue Post Debridement: Fat layer exposed Post Procedure  Diagnosis Same as Pre-procedure Electronic Signature(s) Signed: 02/24/2020 5:04:51 PM By: Worthy Keeler PA-C Signed: 02/25/2020 2:58:18 PM By: Baruch Gouty RN, BSN Entered By: Baruch Gouty on 02/24/2020 08:24:45 -------------------------------------------------------------------------------- HPI Details Patient Name: Date of Service: [name], [name] 02/24/2020 8:00 A M Medical Record Number: [id number] Patient Account Number: [id number] Date of Birth/Sex: Treating RN: October 12, 1965 (54 y.o. [name] Primary Care Provider: Precious Haws Other Clinician: Referring Provider: Treating Provider/Extender: Katheran Awe Weeks in Treatment: 29 History of Present Illness HPI Description: ADMISSION 08/04/2019 This is a 54 year old man who traumatized his right lateral lower leg while going up an escalator at the mall sometime in December 2020. He was referred to vascular surgery and on 04/14/2019 he saw Dr. Wenda Overland. He was felt to have "palpable pulses and normal noninvasive studies" although I cannot see these results. He was back in his primary doctor's office on 07/30/2019 for a nonhealing right lower extremity wound. He was referred to wound care I think in [facility] but his insurance was out of network. A CNS was ordered. X-ray was done that was negative. He has been using wound cleanser and a Band-Aid. Past medical history is actually quite extensive and includes chronic kidney disease stage IV, chronic diastolic heart failure, atrial fibrillation, poorly controlled hypertension, poorly controlled diabetes with peripheral neuropathy and a recent hemoglobin A1c of 11.5, depression, lymphedema and sleep apnea ABI in our clinic was 1.1 in the right 5/6; this wound was initially traumatized while the patient was going down on an escalator. Arrived in clinic last week with a completely nonviable surface. He required mechanical debridement and we have been using Iodoflex  under compression. Apparently the compression wraps fell down. He also has poorly controlled diabetes 5/15; traumatic/contusion wound to the right lateral lower leg. We have been using Iodoflex to clean at the surface of this which was completely nonviable along with mechanical debridements. He is under compression. 09/02/2019 upon evaluation today patient appears to be doing  well with regard to his wound on his right lateral leg. He has been tolerating the dressing changes without complication. Fortunately there is no signs of active infection at this time. No fevers, chills, nausea, vomiting, or diarrhea. With that being said there is some necrotic tissue around the edges of the wound that is and require some sharp debridement today. That was discussed with the patient and his mother in the office today prior to debridement to which she did consent. 09/09/2019 upon evaluation today patient appears to be making progress in regard to his wound. Fortunately the wound is not nearly as deep as what it was. It is measuring a little bit larger due to having to clear some of the edges away but again that is necessary to get this to heal. Fortunately there is no evidence of active infection at this time. No fevers, chills, nausea, vomiting, or diarrhea. 09/16/2019 on evaluation today patient appears to be doing excellent in regard to his wound currently. In fact I do not see anything that we can need to debride today which is great news. The wound bed looks healthy and the edges of the wound look like they are doing great. Overall I feel like we are at a good place and headed in the proper direction. 09/23/2019 upon evaluation today patient appears to be doing okay in regard to his wound. This is not measuring any smaller it is about the same. With that being said he tells me that his wrap actually slid down he ended up having to take this off he was on a trip and did not have his Velcro compression with  him. With that being said I do not see any signs of anything significantly worsening which is good news. 09/30/2019 upon evaluation today patient appears to be doing a little bit better in regard to his leg ulcer. He has been tolerating the dressing changes without complication. Fortunately there is no signs of active infection at this time. No fevers, chills, nausea, vomiting, or diarrhea. 10/14/2019 upon evaluation today patient's wound appears to be healthier. Overall but unfortunately is measuring a little bit bigger. With that being said I do think that we do need to still continue to wrap his leg. Fortunately there is no signs of active infection at this time which is great news. 11/18/2019 upon evaluation today patient actually appears to be doing decently well with regard to his wound. He has been tolerating the dressing changes without complication. Fortunately there is no signs of active infection has been using just Neosporin at this point since he was in the hospital. He did have a venous Doppler study in the hospital though I cannot see the results that was from November 04, 2019. Nonetheless overall I am pleased with the fact the patient does seem to be doing better. 11/25/2019 on evaluation today patient appears to be doing well with regard to his wounds. He has been tolerating the dressing changes without complication. Fortunately there is no signs of active infection at this time. No fevers, chills, nausea, vomiting, or diarrhea. I do believe that he does not appear to be showing any signs of worsening although he also seems to be having some trouble getting this to proceed in a good fashion towards healing. 12/02/2019 on evaluation today patient actually appears to be doing not quite as well in regard to his wounds. Both have some slough noted on the surface of the wound. Unfortunately he is continuing to have some issues here  with pain as well in the anterior portion which I think is an issue.  Fortunately there is no signs of active infection at this time which is great news. No fevers, chills, nausea, vomiting, or diarrhea. 12/30/2019 upon evaluation today patient unfortunately has not been seen in the past month we are just now seeing him back for reevaluation today last time I saw him was August 25. At that point he had a lateral wound on his leg and an anterior wound which which is starting. The lateral has healed unfortunately the anterior looks worse. With that being said the patient unfortunately seems to be having some issues today that I cannot completely explain by just the way the wound appears. The wound itself is not really hot to touch and to be honest I would not necessarily assume infection upon initial inspection. With that being said he is acting very lethargic, was weak walking into the clinic according to his mother who is with him today, his blood pressure is actually in a normal range at around 110/73 which is unusual for him he normally runs extremely high in the 272Z for systolic. His blood glucose was 227 here in the clinic today respiratory rate was 18 and his temperature was 97.7. Pulse was right around 70 when I checked this manually. Nonetheless although his vital signs and normal circumstances would be reassuring I am still kind of concerned just because of the way he is acting. I did question him about medications that he has taken he told me that he took 2 Tylenol and tramadol this morning he also tells me he took 2 tramadol last night. With that being said I do not believe that just taking the tramadol is accounting for what is going on as well based on what I see today. I discussed all this with his mother as well as the patient face-to-face in the office today and subsequently I think I would recommend further evaluation at the ER as I really feel like there is something going on here that I cannot identify just here in the clinic. 01/13/2020 upon evaluation  today patient presents for follow-up concerning ongoing issues with his right leg ulcer. After I last saw him 2 weeks ago he was actually sent to the ER where subsequently he ended up being placed on dialysis. He is doing better although he still seems very tired I think still in general he is doing much better. Fortunately there is no signs of active infection at this time. No fevers, chills, nausea, vomiting, or diarrhea. 01/26/2020; this is a patient with a large irregular albeit superficial wound on the right anterior lower leg. He has been using Santyl. He has recently been initiated on dialysis after we sent him to the ER earlier this month. He describes the wound is being very painful. By his description this started as a small raised area that rapidly expanded. He does not have a lot of obvious venous issues. Looking back through the records this was described as initially traumatic in April. He has seen vascular surgery and not felt to have an arterial issue 02/03/2020 upon evaluation today patient actually appears to be doing a little better in regard to his ulcer although it has spread inferiorly the central portion of the wound and towards the upper has actually new skin growth. Fortunately there is no signs of active infection at this time which is great news. 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 With that being said some of the more significant issues here such as angiosarcoma does not appear to be likely based on pathology report. It seems that were more in the arterial or possible calciphylaxis realm based on what I am  seeing visually. 02/10/2020 on evaluation today patient actually making some progress in regard to his leg ulcer. There is a lot of new skin growth which is excellent he tells me he still having a lot of pain but in general this seems to be showing signs of dramatic improvement which is great news. Overall I think that he is continuing to tolerate the Santyl along with ABD pad and roll gauze to secure. 02/17/2020 on evaluation today patient appears to be doing well in regard to his wound. He has been tolerating the dressing changes without complication. Fortunately there is no signs of active infection at this time which is great news and overall very pleased with where he stands today. 02/24/2020 upon evaluation today patient appears to be doing very well with regard to his leg ulcer. He has been tolerating the dressing changes with the Santyl without complication. Fortunately there is no signs of active infection at this time. No fevers, chills, nausea, vomiting, or diarrhea. Electronic Signature(s) Signed: 02/24/2020 8:27:19 AM By: Worthy Keeler PA-C Entered By: Worthy Keeler on 02/24/2020 08:27:18 -------------------------------------------------------------------------------- Physical Exam Details Patient Name: Date of Service: [name], [name] 02/24/2020 8:00 A M Medical Record Number: [id number] Patient Account Number: [id number] Date of Birth/Sex: Treating RN: 04-06-1966 (54 y.o. [name] Primary Care Provider: Precious Haws Other Clinician: Referring Provider: Treating Provider/Extender: [name], [name] in Treatment: 9 Constitutional Well-nourished and well-hydrated in no acute distress. Respiratory normal breathing without difficulty. Psychiatric this patient is able to make decisions and demonstrates good insight into disease process. Alert and Oriented x 3. pleasant and cooperative. Notes Since wound actually showed signs of what appears to be a  stopping of the leading and progressing edge of the wound inferiorly. 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 Now we just have to get the area to heal unfortunately it seems to be making good progress here. Electronic Signature(s) Signed: 02/24/2020 8:27:44 AM By: Worthy Keeler PA-C Entered By: Worthy Keeler on 02/24/2020 08:27:43 -------------------------------------------------------------------------------- Physician Orders Details Patient Name: Date of Service: [name], [name] 02/24/2020 8:00 A M Medical Record Number: [id number] Patient Account Number: [id number] Date of Birth/Sex: Treating RN: [birth year] (54 y.o. [name] Primary Care Provider: Other Clinician: Precious Haws Referring Provider: Treating Provider/Extender: Katheran Awe Weeks in Treatment: 29 Verbal / Phone Orders: No Diagnosis Coding ICD-10 Coding Code Description S80.811D Abrasion, right lower leg, subsequent encounter L97.812 Non-pressure chronic ulcer of other part of right lower leg with fat layer exposed E11.622 Type 2 diabetes mellitus with other skin ulcer I89.0 Lymphedema, not elsewhere classified I87.2 Venous insufficiency (chronic) (peripheral) Follow-up Appointments Return Appointment in 2 weeks. Dressing Change Frequency Wound #2 Right,Anterior Lower Leg Change dressing every day. Skin Barriers/Peri-Wound Care Moisturizing lotion - to leg daily Wound Cleansing May shower and wash wound with soap and water. - use Dial antibacterial soap Primary  Wound Dressing Wound #2 Right,Anterior Lower Leg lginate - pack strip lightly into hole medial aspect of ulcer with santyl at base of hole Calcium A Santyl Ointment Secondary Dressing Wound #2 Right,Anterior Lower Leg Kerlix/Rolled Gauze Dry Gauze ABD pad Edema Control Avoid standing for long periods of time Elevate legs  to the level of the heart or above for 30 minutes daily and/or when sitting, a frequency of: - throughout the day Exercise regularly Electronic Signature(s) Signed: 02/24/2020 5:04:51 PM By: Worthy Keeler PA-C Entered By: Worthy Keeler on 02/24/2020 08:29:12 -------------------------------------------------------------------------------- Problem List Details Patient Name: Date of Service: [name], [name] 02/24/2020 8:00 A M Medical Record Number: [id number] Patient Account Number: [id number] Date of Birth/Sex: Treating RN: Nov 11, 1965 (54 y.o. [name] Primary Care Provider: Precious Haws Other Clinician: Referring Provider: Treating Provider/Extender: Katheran Awe Weeks in Treatment: 29 Active Problems ICD-10 Encounter Code Description Active Date MDM Diagnosis S80.811D Abrasion, right lower leg, subsequent encounter 08/04/2019 No Yes L97.812 Non-pressure chronic ulcer of other part of right lower leg with fat layer 08/04/2019 No Yes exposed E11.622 Type 2 diabetes mellitus with other skin ulcer 08/04/2019 No Yes I89.0 Lymphedema, not elsewhere classified 08/04/2019 No Yes I87.2 Venous insufficiency (chronic) (peripheral) 09/30/2019 No Yes Inactive Problems Resolved Problems Electronic Signature(s) Signed: 02/24/2020 7:53:24 AM By: Worthy Keeler PA-C Entered By: Worthy Keeler on 02/24/2020 07:53:24 -------------------------------------------------------------------------------- Progress Note Details Patient Name: Date of Service: [name], [name] 02/24/2020 8:00 A M Medical Record Number: [id number] Patient Account Number: [id number] Date of Birth/Sex: Treating RN: 09-30-1965 (54 y.o. [name] Primary Care Provider: Precious Haws Other Clinician: Referring Provider: Treating Provider/Extender: [name], [name] in Treatment: 29 Subjective Chief Complaint Information obtained from Patient 08/04/2019; patient is here for  review of the wound on the right lateral lower leg History of Present Illness (HPI) ADMISSION 08/04/2019 This is a 54 year old man who traumatized his right lateral lower leg while going up an escalator at the mall sometime in December 2020. He was referred to vascular surgery and on 04/14/2019 he saw Dr. Wenda Overland. He was felt to have "palpable pulses and normal noninvasive studies" although I cannot see these results. He was back in his primary doctor's office on 07/30/2019 for a nonhealing right lower extremity wound. He was referred to wound care I think in [facility] but his insurance was out of network. A CNS was ordered. X-ray was done that was negative. He has been using wound cleanser and a Band-Aid. Past medical history is actually quite extensive and includes chronic kidney disease stage IV, chronic diastolic heart failure, atrial fibrillation, poorly controlled hypertension, poorly controlled diabetes with peripheral neuropathy and a recent hemoglobin A1c of 11.5, depression, lymphedema and sleep apnea ABI in our clinic was 1.1 in the right 5/6; this wound was initially traumatized while the patient was going down on an escalator. Arrived in clinic last week with a completely nonviable surface. He required mechanical debridement and we have been using Iodoflex under compression. Apparently the compression wraps fell down. He also has poorly controlled diabetes 5/15; traumatic/contusion wound to the right lateral lower leg. We have been using Iodoflex to clean at the surface of this which was completely nonviable along with mechanical debridements. He is under compression. 09/02/2019 upon evaluation today patient appears to be doing well with regard to his wound on his right lateral leg. He has been tolerating the dressing changes without complication. Fortunately there is  no signs of active infection at this time. No fevers, chills, nausea, vomiting, or diarrhea. With that being said there  is some necrotic tissue around the edges of the wound that is and require some sharp debridement today. That was discussed with the patient and his mother in the office today prior to debridement to which she did consent. 09/09/2019 upon evaluation today patient appears to be making progress in regard to his wound. Fortunately the wound is not nearly as deep as what it was. It is measuring a little bit larger due to having to clear some of the edges away but again that is necessary to get this to heal. Fortunately there is no evidence of active infection at this time. No fevers, chills, nausea, vomiting, or diarrhea. 09/16/2019 on evaluation today patient appears to be doing excellent in regard to his wound currently. In fact I do not see anything that we can need to debride today which is great news. The wound bed looks healthy and the edges of the wound look like they are doing great. Overall I feel like we are at a good place and headed in the proper direction. 09/23/2019 upon evaluation today patient appears to be doing okay in regard to his wound. This is not measuring any smaller it is about the same. With that being said he tells me that his wrap actually slid down he ended up having to take this off he was on a trip and did not have his Velcro compression with him. With that being said I do not see any signs of anything significantly worsening which is good news. 09/30/2019 upon evaluation today patient appears to be doing a little bit better in regard to his leg ulcer. He has been tolerating the dressing changes without complication. Fortunately there is no signs of active infection at this time. No fevers, chills, nausea, vomiting, or diarrhea. 10/14/2019 upon evaluation today patient's wound appears to be healthier. Overall but unfortunately is measuring a little bit bigger. With that being said I do think that we do need to still continue to wrap his leg. Fortunately there is no signs of active  infection at this time which is great news. 11/18/2019 upon evaluation today patient actually appears to be doing decently well with regard to his wound. He has been tolerating the dressing changes without complication. Fortunately there is no signs of active infection has been using just Neosporin at this point since he was in the hospital. He did have a venous Doppler study in the hospital though I cannot see the results that was from November 04, 2019. Nonetheless overall I am pleased with the fact the patient does seem to be doing better. 11/25/2019 on evaluation today patient appears to be doing well with regard to his wounds. He has been tolerating the dressing changes without complication. Fortunately there is no signs of active infection at this time. No fevers, chills, nausea, vomiting, or diarrhea. I do believe that he does not appear to be showing any signs of worsening although he also seems to be having some trouble getting this to proceed in a good fashion towards healing. 12/02/2019 on evaluation today patient actually appears to be doing not quite as well in regard to his wounds. Both have some slough noted on the surface of the wound. Unfortunately he is continuing to have some issues here with pain as well in the anterior portion which I think is an issue. Fortunately there is no signs of active infection  at this time which is great news. No fevers, chills, nausea, vomiting, or diarrhea. 12/30/2019 upon evaluation today patient unfortunately has not been seen in the past month we are just now seeing him back for reevaluation today last time I saw him was August 25. At that point he had a lateral wound on his leg and an anterior wound which which is starting. The lateral has healed unfortunately the anterior looks worse. With that being said the patient unfortunately seems to be having some issues today that I cannot completely explain by just the way the wound appears. The wound itself is not  really hot to touch and to be honest I would not necessarily assume infection upon initial inspection. With that being said he is acting very lethargic, was weak walking into the clinic according to his mother who is with him today, his blood pressure is actually in a normal range at around 110/73 which is unusual for him he normally runs extremely high in the 950D for systolic. His blood glucose was 227 here in the clinic today respiratory rate was 18 and his temperature was 97.7. Pulse was right around 70 when I checked this manually. Nonetheless although his vital signs and normal circumstances would be reassuring I am still kind of concerned just because of the way he is acting. I did question him about medications that he has taken he told me that he took 2 Tylenol and tramadol this morning he also tells me he took 2 tramadol last night. With that being said I do not believe that just taking the tramadol is accounting for what is going on as well based on what I see today. I discussed all this with his mother as well as the patient face-to-face in the office today and subsequently I think I would recommend further evaluation at the ER as I really feel like there is something going on here that I cannot identify just here in the clinic. 01/13/2020 upon evaluation today patient presents for follow-up concerning ongoing issues with his right leg ulcer. After I last saw him 2 weeks ago he was actually sent to the ER where subsequently he ended up being placed on dialysis. He is doing better although he still seems very tired I think still in general he is doing much better. Fortunately there is no signs of active infection at this time. No fevers, chills, nausea, vomiting, or diarrhea. 01/26/2020; this is a patient with a large irregular albeit superficial wound on the right anterior lower leg. He has been using Santyl. He has recently been initiated on dialysis after we sent him to the ER earlier this  month. He describes the wound is being very painful. By his description this started as a small raised area that rapidly expanded. He does not have a lot of obvious venous issues. Looking back through the records this was described as initially traumatic in April. He has seen vascular surgery and not felt to have an arterial issue 02/03/2020 upon evaluation today patient actually appears to be doing a little better in regard to his ulcer although it has spread inferiorly the central portion of the wound and towards the upper has actually new skin growth. Fortunately there is no signs of active infection at this time which is great news. 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 With that being said some of the more significant issues here such as angiosarcoma does not appear to be likely based on pathology report. It seems that were more in the arterial or possible calciphylaxis realm based on what I am seeing visually. 02/10/2020 on evaluation today patient actually making some progress in regard to his leg ulcer. There is a lot of new skin growth which is excellent he tells me he still having a lot of pain but in general this seems to be showing signs of dramatic improvement which is great news. Overall I think that he is continuing to tolerate the Santyl along with ABD pad and roll gauze to secure. 02/17/2020 on evaluation today patient appears to be doing well in regard to his wound. He has been tolerating the dressing changes without complication. Fortunately there is no signs of active infection at this time which is great news and overall very pleased with  where he stands today. 02/24/2020 upon evaluation today patient appears to be doing very well with regard to his leg ulcer. He has been tolerating the dressing changes with the Santyl without complication. Fortunately there is no signs of active infection at this time. No fevers, chills, nausea, vomiting, or diarrhea. Objective Constitutional Well-nourished and well-hydrated in no acute distress. Vitals Time Taken: 7:58 AM, Weight: 256 lbs, Temperature: 98.2 F, Pulse: 92 bpm, Respiratory Rate: 18 breaths/min, Blood Pressure: 157/96 mmHg, Capillary Blood Glucose: 121 mg/dl. Respiratory normal breathing without difficulty. Psychiatric this patient is able to make decisions and demonstrates good insight into disease process. Alert and Oriented x 3. pleasant and cooperative. General Notes: Since wound actually showed signs of what appears to be a stopping of the leading and progressing edge of the wound inferiorly. 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 Now we just have to get the area to heal unfortunately it seems to be making good progress here. Integumentary (Hair, Skin) Wound #2 status is Open. Original cause of wound was Trauma. The wound is located on the Right,Anterior Lower Leg. The wound measures 8.5cm length x 5.5cm width x 0.7cm depth; 36.717cm^2 area and 25.702cm^3 volume. There is Fat Layer (Subcutaneous Tissue) exposed. There is no tunneling or undermining noted. There is a medium amount of serosanguineous drainage noted. The wound margin is flat and intact. There is medium (34-66%) pink granulation within the wound bed. There is a medium (34-66%) amount of necrotic tissue within the wound bed including Adherent Slough. Wound #3 status is Healed - Epithelialized. Original cause of wound was Gradually Appeared. The wound is located on the Right,Proximal,Anterior Lower Leg. The wound  measures 0cm length x 0cm width x 0cm depth; 0cm^2 area and 0cm^3 volume. There is no tunneling or undermining noted. There is a none present amount of drainage noted. The wound margin is flat and intact. There is no granulation within the wound bed. There is no necrotic tissue within the wound bed. 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 There was a Chemical/Enzymatic/Mechanical debridement performed by [name], RN.Marland Kitchen Agent used was [employer]. A time out was conducted at 08:23, prior to the start of the procedure. There was no bleeding. The procedure was tolerated well. Post Debridement Measurements: 8.5cm length x 5.5cm width x 0.7cm depth; 25.702cm^3 volume. Character of Wound/Ulcer Post Debridement requires further debridement. Severity of Tissue Post Debridement is: Fat layer exposed. Post procedure Diagnosis Wound #2: Same as Pre-Procedure Plan Follow-up Appointments: Return Appointment in 2 weeks. Dressing Change Frequency: Wound #2 Right,Anterior Lower Leg: Change dressing every day. Skin Barriers/Peri-Wound Care: Moisturizing lotion - to leg daily Wound Cleansing: May shower and wash wound with soap and water. - use Dial antibacterial soap Primary Wound Dressing: Wound #2 Right,Anterior Lower Leg: Calcium Alginate - pack strip lightly into hole medial aspect of ulcer with santyl at base of hole Santyl Ointment Secondary Dressing: Wound #2 Right,Anterior Lower Leg: Kerlix/Rolled Gauze Dry Gauze ABD pad Edema Control: Avoid standing for long periods  of time Elevate legs to the level of the heart or above for 30 minutes daily and/or when sitting, a frequency of: - throughout the day Exercise regularly 1. Would recommend at this time that we have the patient go ahead and continue to elevate his legs much as possible and use the Santyl I think the Annitta Needs is doing a great job I see no reason to change at this point. 2. I am also can recommend patient continue to monitor for any signs of infection I see nothing right now but obviously that could change at any point. 3. I am also going to recommend the patient continue to ambulate and move as he can in order to help clear out any fluid fortunately he does not seem to be too swollen at this point with edema buildup in the leg which is great news. We will see patient back for reevaluation in 2 weeks here in the clinic. If anything worsens or changes patient will contact our office for additional recommendations. Electronic Signature(s) Signed: 02/24/2020 8:29:25 AM By: Worthy Keeler PA-C Previous Signature: 02/24/2020 8:28:41 AM Version By: Worthy Keeler PA-C Entered By: Worthy Keeler on 02/24/2020 84:13:24 -------------------------------------------------------------------------------- SuperBill Details Patient Name: Date of Service: [name], [name] 02/24/2020 Medical Record Number: [id number] Patient Account Number: [id number] Date of Birth/Sex: Treating RN: May 19, 1965 (54 y.o. [name] Primary Care Provider: Precious Haws Other Clinician: Referring Provider: Treating Provider/Extender: [name], [name] in Treatment: 29 Diagnosis Coding ICD-10 Codes Code Description S80.811D Abrasion, right lower leg, subsequent encounter L97.812 Non-pressure chronic ulcer of other part of right lower leg with fat layer exposed E11.622 Type 2 diabetes mellitus with other skin ulcer I89.0 Lymphedema, not elsewhere classified I87.2 Venous insufficiency (chronic)  (peripheral) Facility Procedures CPT4 Code: [id number] Description: [phone] - DEBRIDE W/O ANES NON SELECT Modifier: Quantity: 1 Physician Procedures : CPT4 Code Description Modifier [id number] - WC PHYS LEVEL 3 - EST PT ICD-10 Diagnosis Description S80.811D Abrasion, right lower leg, subsequent encounter L97.812 Non-pressure chronic ulcer of other part of right lower leg with fat layer exposed  E11.622 Type 2 diabetes mellitus with other skin ulcer I89.0 Lymphedema, not elsewhere classified Quantity: 1 Electronic Signature(s) Signed: 02/24/2020 8:29:39 AM By: Worthy Keeler PA-C Entered By: Worthy Keeler on 02/24/2020 08:29:39

## 2020-02-25 ENCOUNTER — Ambulatory Visit (HOSPITAL_COMMUNITY)
Admission: RE | Admit: 2020-02-25 | Discharge: 2020-02-25 | Disposition: A | Discharge status: Home / Self Care | Payer: Medicare Other | Source: Ambulatory Visit | Attending: Vascular Surgery | Admitting: Vascular Surgery

## 2020-02-25 ENCOUNTER — Other Ambulatory Visit: Payer: Self-pay

## 2020-02-25 ENCOUNTER — Ambulatory Visit (HOSPITAL_COMMUNITY)
Admission: RE | Admit: 2020-02-25 | Discharge: 2020-02-25 | Disposition: A | Discharge status: Home / Self Care | Payer: Medicare Other | Source: Ambulatory Visit | Attending: Physician Assistant | Admitting: Physician Assistant

## 2020-02-25 ENCOUNTER — Ambulatory Visit (INDEPENDENT_AMBULATORY_CARE_PROVIDER_SITE_OTHER)
Admission: RE | Admit: 2020-02-25 | Discharge: 2020-02-25 | Disposition: A | Discharge status: Home / Self Care | Payer: Medicare Other | Source: Ambulatory Visit | Attending: Vascular Surgery | Admitting: Vascular Surgery

## 2020-02-25 ENCOUNTER — Ambulatory Visit (INDEPENDENT_AMBULATORY_CARE_PROVIDER_SITE_OTHER): Payer: Self-pay | Admitting: Physician Assistant

## 2020-02-25 ENCOUNTER — Other Ambulatory Visit (HOSPITAL_COMMUNITY): Payer: Self-pay | Admitting: Physician Assistant

## 2020-02-25 VITALS — BP 133/89 | HR 82 | Temp 97.7°F | Resp 18 | Ht 67.0 in | Wt 229.0 lb

## 2020-02-25 DIAGNOSIS — L98491 Non-pressure chronic ulcer of skin of other sites limited to breakdown of skin: Secondary | ICD-10-CM | POA: Diagnosis present

## 2020-02-25 DIAGNOSIS — L98499 Non-pressure chronic ulcer of skin of other sites with unspecified severity: Secondary | ICD-10-CM

## 2020-02-25 DIAGNOSIS — N185 Chronic kidney disease, stage 5: Secondary | ICD-10-CM | POA: Diagnosis not present

## 2020-02-25 DIAGNOSIS — N186 End stage renal disease: Secondary | ICD-10-CM

## 2020-02-25 NOTE — Progress Notes (Signed)
    Postoperative Access Visit   History of Present Illness   [name] is a 54 y.o. year old male who presents for postoperative follow-up for: left brachiocephalic arteriovenous fistula creation by Dr. Scot Dock 01/05/20.  The patient's incision is healed.  The patient denies steal symptoms.  He is currently dialyzing via R IJ TDC on a MWF schedule.    He is also here for evaluation of R shin wound.  He had a large wound after kicking the corner of his bed that has been healing with wound care provided by the wound clinic.  Recent biopsy was negative for calciphylaxis.  He denies any claudication, rest pain, or non healing wounds of R foot.  He is an insulin dependent diabetic.   Physical Examination   Vitals:   02/25/20 1503  BP: 133/89  Pulse: 82  Resp: 18  Temp: 97.7 F (36.5 C)  TempSrc: Temporal  SpO2: 99%  Weight: 229 lb (103.9 kg)  Height: 5\' 7"  (1.702 m)   Body mass index is 35.87 kg/m.  left arm Incision is healed, palpable radial pulse, hand grip is 5/5, sensation in digits is intact, palpable thrill, bruit can be auscultated   R shin with superficial pretibial wound, no sign of infection; palpable DP pulse    Medical Decision Making   [name] is a 54 y.o. year old male who presents s/p left brachiocephalic arteriovenous fistula   Patent brachiocephalic fistula without signs or symptoms of steal syndrome  The patient's access will be ready for use 04/05/20  The patient's tunneled dialysis catheter can be removed when Nephrology is comfortable with the performance of the fistula  R ABI within normal limits with 2+ R DP pulse; continue current wound care; may consider adding knee high compression to speed healing  The patient may follow up on a prn basis   [name] Ligas PA-C [facility] Office: [phone]  Clinic MD: Scot Dock

## 2020-02-25 NOTE — Progress Notes (Addendum)
[name], [name] ([id number]) Visit Report for 02/24/2020 Arrival Information Details Patient Name: Date of Service: [name], [name] 02/24/2020 8:00 A M Medical Record Number: [id number] Patient Account Number: [id number] Date of Birth/Sex: Treating RN: 04-18-1965 (54 y.o. [name] Primary Care Lashai Grosch: Precious Haws Other Clinician: Referring Alycea Segoviano: Treating Mahamud Metts/Extender: Katheran Awe Weeks in Treatment: 29 Visit Information History Since Last Visit Added or deleted any medications: No Patient Arrived: Ambulatory Any new allergies or adverse reactions: No Arrival Time: 07:56 Had a fall or experienced change in No Accompanied By: mother activities of daily living that may affect Transfer Assistance: None risk of falls: Patient Identification Verified: Yes Signs or symptoms of abuse/neglect since last visito No Secondary Verification Process Completed: Yes Hospitalized since last visit: No Patient Requires Transmission-Based Precautions: No Implantable device outside of the clinic excluding No Patient Has Alerts: Yes cellular tissue based products placed in the center Patient Alerts: R ABI =1.19, TBI = .93 since last visit: L ABI = 1.29, TBI = .86 Has Dressing in Place as Prescribed: Yes Pain Present Now: Yes Electronic Signature(s) Signed: 03/01/2020 2:07:57 PM By: Baruch Gouty RN, BSN Previous Signature: 02/24/2020 11:19:23 AM Version By: Sandre Kitty Entered By: Baruch Gouty on 03/01/2020 13:14:44 -------------------------------------------------------------------------------- Encounter Discharge Information Details Patient Name: Date of Service: [name], [name] 02/24/2020 8:00 A M Medical Record Number: [id number] Patient Account Number: [id number] Date of Birth/Sex: Treating RN: 1966/01/26 (54 y.o. [name] Primary Care Ysenia Filice: Precious Haws Other Clinician: Referring Rehman Levinson: Treating Dylann Layne/Extender: [name], [name] in Treatment: 29 Encounter Discharge Information Items Post Procedure Vitals Discharge Condition: Stable Temperature (F): 98.2 Ambulatory Status: Ambulatory Pulse (bpm): 98 Discharge Destination: Home Respiratory Rate (breaths/min): 18 Transportation: Private Auto Blood Pressure (mmHg): 157/96 Accompanied By: mother Schedule Follow-up Appointment: Yes Clinical Summary of Care: Patient Declined Electronic Signature(s) Signed: 02/24/2020 5:02:10 PM By: [name] RN Entered By: [name] on 02/24/2020 08:44:11 -------------------------------------------------------------------------------- Lower Extremity Assessment Details Patient Name: Date of Service: [name], [name] 02/24/2020 8:00 A M Medical Record Number: [id number] Patient Account Number: [id number] Date of Birth/Sex: Treating RN: 10-03-1965 (54 y.o. [name] Primary Care Sonam Wandel: Precious Haws Other Clinician: Referring Demarquis Osley: Treating Kearney Evitt/Extender: [name], [name] in Treatment: 29 Edema Assessment Assessed: [Left: No] [Right: No] Edema: [Left: Ye] [Right: s] Calf Left: Right: Point of Measurement: 31 cm From Medial Instep 38 cm Ankle Left: Right: Point of Measurement: 8 cm From Medial Instep 23 cm Vascular Assessment Pulses: Dorsalis Pedis Palpable: [Right:Yes] Electronic Signature(s) Signed: 02/24/2020 5:17:00 PM By: Levan Hurst RN, BSN Entered By: Levan Hurst on 02/24/2020 08:06:48 -------------------------------------------------------------------------------- Multi-Disciplinary Care Plan Details Patient Name: Date of Service: [name], [name] 02/24/2020 8:00 A M Medical Record Number: [id number] Patient Account Number: [id number] Date of Birth/Sex: Treating RN: 10/21/1965 (54 y.o. [name] Primary Care Alfonse Garringer: Precious Haws Other Clinician: Referring Elgin Carn: Treating Worth Kober/Extender: [name], [name]  in Treatment: 29 Active Inactive Venous Leg Ulcer Nursing Diagnoses: Knowledge deficit related to disease process and management Potential for venous Insuffiency (use before diagnosis confirmed) Goals: Patient will maintain optimal edema control Date Initiated: 09/09/2019 Target Resolution Date: 03/03/2020 Goal Status: Active Patient/caregiver will verbalize understanding of disease process and disease management Date Initiated: 09/09/2019 Date Inactivated: 10/14/2019 Target Resolution Date: 10/07/2019 Goal Status: Met Interventions: Assess peripheral edema status every visit. Compression as ordered Provide education on venous insufficiency Treatment Activities: Therapeutic compression applied : 09/09/2019 Notes:  Wound/Skin Impairment Nursing Diagnoses: Knowledge deficit related to ulceration/compromised skin integrity Goals: Patient/caregiver will verbalize understanding of skin care regimen Date Initiated: 08/04/2019 Target Resolution Date: 03/23/2020 Goal Status: Active Ulcer/skin breakdown will have a volume reduction of 30% by week 4 Date Initiated: 08/04/2019 Date Inactivated: 09/09/2019 Target Resolution Date: 09/11/2019 Goal Status: Met Ulcer/skin breakdown will have a volume reduction of 50% by week 8 Date Initiated: 09/09/2019 Date Inactivated: 10/14/2019 Target Resolution Date: 10/07/2019 Goal Status: Unmet Unmet Reason: awaiting reflux studies Ulcer/skin breakdown will have a volume reduction of 80% by week 12 Date Initiated: 10/14/2019 Date Inactivated: 11/18/2019 Target Resolution Date: 11/04/2019 Goal Status: Unmet Unmet Reason: uncontrolled htn Interventions: Assess patient/caregiver ability to obtain necessary supplies Assess patient/caregiver ability to perform ulcer/skin care regimen upon admission and as needed Assess ulceration(s) every visit Notes: Electronic Signature(s) Signed: 02/25/2020 2:58:18 PM By: Baruch Gouty RN, BSN Entered By: Baruch Gouty on  02/24/2020 08:21:13 -------------------------------------------------------------------------------- Pain Assessment Details Patient Name: Date of Service: [name], [name] 02/24/2020 8:00 A M Medical Record Number: [id number] Patient Account Number: [id number] Date of Birth/Sex: Treating RN: 16-Aug-1965 (54 y.o. [name] Primary Care Acasia Skilton: Precious Haws Other Clinician: Referring Sparkle Aube: Treating Tinley Rought/Extender: [name], [name] in Treatment: 29 Active Problems Location of Pain Severity and Description of Pain Patient Has Paino Yes Site Locations Rate the pain. Rate the pain. Current Pain Level: 7 Pain Management and Medication Current Pain Management: Electronic Signature(s) Signed: 02/24/2020 11:19:23 AM By: Sandre Kitty Signed: 02/25/2020 2:58:18 PM By: Baruch Gouty RN, BSN Entered By: Sandre Kitty on 02/24/2020 07:58:43 -------------------------------------------------------------------------------- Patient/Caregiver Education Details Patient Name: Date of Service: [name], [name] 11/17/2021andnbsp8:00 A M Medical Record Number: [id number] Patient Account Number: [id number] Date of Birth/Gender: Treating RN: [birth year] (54 y.o. [name] Primary Care Physician: Precious Haws Other Clinician: Referring Physician: Treating Physician/Extender: Burnard Hawthorne in Treatment: 29 Education Assessment Education Provided To: Patient Education Topics Provided Venous: Methods: Explain/Verbal Responses: Reinforcements needed, State content correctly Wound/Skin Impairment: Methods: Explain/Verbal Responses: Reinforcements needed, State content correctly Electronic Signature(s) Signed: 02/25/2020 2:58:18 PM By: Baruch Gouty RN, BSN Entered By: Baruch Gouty on 02/24/2020 08:21:37 -------------------------------------------------------------------------------- Wound Assessment Details Patient Name:  Date of Service: [name], [name] 02/24/2020 8:00 A M Medical Record Number: [id number] Patient Account Number: [id number] Date of Birth/Sex: Treating RN: Aug 24, 1965 (54 y.o. [name] Primary Care Mirabella Hilario: Precious Haws Other Clinician: Referring Jeyli Zwicker: Treating Jessika Rothery/Extender: [name], [name] in Treatment: 29 Wound Status Wound Number: 2 Primary Venous Leg Ulcer Etiology: Wound Location: Right, Anterior Lower Leg Wound Open Wounding Event: Trauma Status: Date Acquired: 11/30/2019 Comorbid Lymphedema, Sleep Apnea, Arrhythmia, Congestive Heart Failure, Weeks Of Treatment: 12 History: Hypertension, Peripheral Arterial Disease, Type II Diabetes Clustered Wound: No Photos Wound Measurements Length: (cm) 8.5 Width: (cm) 5.5 Depth: (cm) 0.7 Area: (cm) 36.717 Volume: (cm) 25.702 % Reduction in Area: -595.7% % Reduction in Volume: -4767.8% Epithelialization: Small (1-33%) Tunneling: No Undermining: No Wound Description Classification: Full Thickness Without Exposed Support Structures Wound Margin: Flat and Intact Exudate Amount: Medium Exudate Type: Serosanguineous Exudate Color: red, brown Foul Odor After Cleansing: No Slough/Fibrino Yes Wound Bed Granulation Amount: Medium (34-66%) Exposed Structure Granulation Quality: Pink Fascia Exposed: No Necrotic Amount: Medium (34-66%) Fat Layer (Subcutaneous Tissue) Exposed: Yes Necrotic Quality: Adherent Slough Tendon Exposed: No Muscle Exposed: No Joint Exposed: No Bone Exposed: No Treatment Notes Wound #2 (Right, Anterior Lower Leg) 1. Cleanse With Wound Cleanser 2. Periwound  Care Moisturizing lotion 3. Primary Dressing Applied Calcium Alginate Ag Santyl 4. Secondary Dressing Dry Gauze Roll Gauze 5. Secured With Tape Notes Horticulturist, commercial) Signed: 02/25/2020 2:58:18 PM By: Baruch Gouty RN, BSN Signed: 02/25/2020 3:37:29 PM By: Mikeal Hawthorne  EMT/HBOT/SD Previous Signature: 02/24/2020 5:17:00 PM Version By: Levan Hurst RN, BSN Entered By: Mikeal Hawthorne on 02/25/2020 09:01:50 -------------------------------------------------------------------------------- Wound Assessment Details Patient Name: Date of Service: [name], [name] 02/24/2020 8:00 A M Medical Record Number: [id number] Patient Account Number: [id number] Date of Birth/Sex: Treating RN: 1965-07-10 (54 y.o. [name] Primary Care Desire Fulp: Precious Haws Other Clinician: Referring Yoceline Bazar: Treating Koralee Wedeking/Extender: [name], [name] in Treatment: 29 Wound Status Wound Number: 3 Primary Venous Leg Ulcer Etiology: Wound Location: Right, Proximal, Anterior Lower Leg Wound Healed - Epithelialized Wounding Event: Gradually Appeared Status: Date Acquired: 02/10/2020 Comorbid Lymphedema, Sleep Apnea, Arrhythmia, Congestive Heart Failure, Weeks Of Treatment: 2 History: Hypertension, Peripheral Arterial Disease, Type II Diabetes Clustered Wound: No Wound Measurements Length: (cm) Width: (cm) Depth: (cm) Area: (cm) Volume: (cm) 0 % Reduction in Area: 100% 0 % Reduction in Volume: 100% 0 Epithelialization: Large (67-100%) 0 Tunneling: No 0 Undermining: No Wound Description Classification: Full Thickness Without Exposed Support Structures Wound Margin: Flat and Intact Exudate Amount: None Present Foul Odor After Cleansing: No Slough/Fibrino No Wound Bed Granulation Amount: None Present (0%) Exposed Structure Necrotic Amount: None Present (0%) Fascia Exposed: No Fat Layer (Subcutaneous Tissue) Exposed: No Tendon Exposed: No Muscle Exposed: No Joint Exposed: No Bone Exposed: No Electronic Signature(s) Signed: 02/24/2020 5:17:00 PM By: Levan Hurst RN, BSN Signed: 02/25/2020 2:58:18 PM By: Baruch Gouty RN, BSN Entered By: Levan Hurst on 02/24/2020  94:80:16 -------------------------------------------------------------------------------- Bliss Details Patient Name: Date of Service: [name], [name] 02/24/2020 8:00 A M Medical Record Number: [id number] Patient Account Number: [id number] Date of Birth/Sex: Treating RN: 1965/04/12 (54 y.o. [name] Primary Care Torianna Junio: Precious Haws Other Clinician: Referring Alsie Younes: Treating Deeanne Deininger/Extender: [name], [name] in Treatment: 29 Vital Signs Time Taken: 07:58 Temperature (F): 98.2 Weight (lbs): 256 Pulse (bpm): 92 Respiratory Rate (breaths/min): 18 Blood Pressure (mmHg): 157/96 Capillary Blood Glucose (mg/dl): 121 Reference Range: 80 - 120 mg / dl Electronic Signature(s) Signed: 02/24/2020 11:19:23 AM By: Sandre Kitty Entered By: Sandre Kitty on 02/24/2020 07:58:50

## 2020-03-09 ENCOUNTER — Other Ambulatory Visit: Payer: Self-pay

## 2020-03-09 ENCOUNTER — Encounter (HOSPITAL_BASED_OUTPATIENT_CLINIC_OR_DEPARTMENT_OTHER): Payer: Medicare Other | Attending: Physician Assistant | Admitting: Physician Assistant

## 2020-03-09 DIAGNOSIS — I5032 Chronic diastolic (congestive) heart failure: Secondary | ICD-10-CM | POA: Diagnosis not present

## 2020-03-09 DIAGNOSIS — E1122 Type 2 diabetes mellitus with diabetic chronic kidney disease: Secondary | ICD-10-CM | POA: Diagnosis not present

## 2020-03-09 DIAGNOSIS — E1151 Type 2 diabetes mellitus with diabetic peripheral angiopathy without gangrene: Secondary | ICD-10-CM | POA: Insufficient documentation

## 2020-03-09 DIAGNOSIS — Z992 Dependence on renal dialysis: Secondary | ICD-10-CM | POA: Diagnosis not present

## 2020-03-09 DIAGNOSIS — C9110 Chronic lymphocytic leukemia of B-cell type not having achieved remission: Secondary | ICD-10-CM | POA: Insufficient documentation

## 2020-03-09 DIAGNOSIS — I872 Venous insufficiency (chronic) (peripheral): Secondary | ICD-10-CM | POA: Insufficient documentation

## 2020-03-09 DIAGNOSIS — I890 Lymphedema, not elsewhere classified: Secondary | ICD-10-CM | POA: Insufficient documentation

## 2020-03-09 DIAGNOSIS — N184 Chronic kidney disease, stage 4 (severe): Secondary | ICD-10-CM | POA: Insufficient documentation

## 2020-03-09 DIAGNOSIS — I130 Hypertensive heart and chronic kidney disease with heart failure and stage 1 through stage 4 chronic kidney disease, or unspecified chronic kidney disease: Secondary | ICD-10-CM | POA: Insufficient documentation

## 2020-03-09 DIAGNOSIS — I4891 Unspecified atrial fibrillation: Secondary | ICD-10-CM | POA: Insufficient documentation

## 2020-03-09 DIAGNOSIS — L97812 Non-pressure chronic ulcer of other part of right lower leg with fat layer exposed: Secondary | ICD-10-CM | POA: Insufficient documentation

## 2020-03-09 DIAGNOSIS — E11622 Type 2 diabetes mellitus with other skin ulcer: Secondary | ICD-10-CM | POA: Diagnosis not present

## 2020-03-09 NOTE — Progress Notes (Addendum)
Quintanar, GEN ([id number]) Visit Report for 03/09/2020 Chief Complaint Document Details Patient Name: Date of Service: [name], [name] 03/09/2020 8:00 A M Medical Record Number: [id number] Patient Account Number: [id number] Date of Birth/Sex: Treating RN: 22-May-1965 (54 y.o. [name] Primary Care Provider: Precious Haws Other Clinician: Referring Provider: Treating Provider/Extender: Katheran Awe Weeks in Treatment: 31 Information Obtained from: Patient Chief Complaint 08/04/2019; patient is here for review of the wound on the right lateral lower leg Electronic Signature(s) Signed: 03/09/2020 8:20:43 AM By: Worthy Keeler PA-C Entered By: Worthy Keeler on 03/09/2020 08:20:43 -------------------------------------------------------------------------------- Debridement Details Patient Name: Date of Service: [name], [name] 03/09/2020 8:00 A M Medical Record Number: [id number] Patient Account Number: [id number] Date of Birth/Sex: Treating RN: [birth year] (54 y.o. [name], [name] Primary Care Provider: Precious Haws Other Clinician: Referring Provider: Treating Provider/Extender: Katheran Awe Weeks in Treatment: 31 Debridement Performed for Assessment: Wound #2 Right,Anterior Lower Leg Performed By: Physician Worthy Keeler, PA Debridement Type: Chemical/Enzymatic/Mechanical Agent Used: Santyl Severity of Tissue Pre Debridement: Fat layer exposed Level of Consciousness (Pre-procedure): Awake and Alert Pre-procedure Verification/Time Out Yes - 08:45 Taken: Bleeding: None Response to Treatment: Procedure was tolerated well Level of Consciousness (Post- Awake and Alert procedure): Post Debridement Measurements of Total Wound Length: (cm) 5.3 Width: (cm) 4.1 Depth: (cm) 0.1 Volume: (cm) 1.707 Character of Wound/Ulcer Post Debridement: Improved Severity of Tissue Post Debridement: Fat layer exposed Post Procedure Diagnosis Same as  Pre-procedure Electronic Signature(s) Signed: 03/09/2020 4:34:22 PM By: Worthy Keeler PA-C Signed: 03/10/2020 5:27:06 PM By: Rhae Hammock RN Entered By: Rhae Hammock on 03/09/2020 08:46:17 -------------------------------------------------------------------------------- HPI Details Patient Name: Date of Service: [name], [name] 03/09/2020 8:00 A M Medical Record Number: [id number] Patient Account Number: [id number] Date of Birth/Sex: Treating RN: [birth year] (54 y.o. [name] Primary Care Provider: Precious Haws Other Clinician: Referring Provider: Treating Provider/Extender: Katheran Awe Weeks in Treatment: 31 History of Present Illness HPI Description: ADMISSION 08/04/2019 This is a 54 year old man who traumatized his right lateral lower leg while going [place] an escalator at the mall sometime in December 2020. He was referred to vascular surgery and on 04/14/2019 he saw Dr. Wenda Overland. He was felt to have "palpable pulses and normal noninvasive studies" although I cannot see these results. He was back in his primary doctor's office on 07/30/2019 for a nonhealing right lower extremity wound. He was referred to wound care I think in [facility] but his insurance was out of network. A CNS was ordered. X-ray was done that was negative. He has been using wound cleanser and a Band-Aid. Past medical history is actually quite extensive and includes chronic kidney disease stage IV, chronic diastolic heart failure, atrial fibrillation, poorly controlled hypertension, poorly controlled diabetes with peripheral neuropathy and a recent hemoglobin A1c of 11.5, depression, lymphedema and sleep apnea ABI in our clinic was 1.1 in the right 5/6; this wound was initially traumatized while the patient was going down on an escalator. Arrived in clinic last week with a completely nonviable surface. He required mechanical debridement and we have been using Iodoflex under compression.  Apparently the compression wraps fell down. He also has poorly controlled diabetes 5/15; traumatic/contusion wound to the right lateral lower leg. We have been using Iodoflex to clean at the surface of this which was completely nonviable along with mechanical debridements. He is under compression. 09/02/2019 upon evaluation today patient appears to be doing well with  regard to his wound on his right lateral leg. He has been tolerating the dressing changes without complication. Fortunately there is no signs of active infection at this time. No fevers, chills, nausea, vomiting, or diarrhea. With that being said there is some necrotic tissue around the edges of the wound that is and require some sharp debridement today. That was discussed with the patient and his mother in the office today prior to debridement to which she did consent. 09/09/2019 upon evaluation today patient appears to be making progress in regard to his wound. Fortunately the wound is not nearly as deep as what it was. It is measuring a little bit larger due to having to clear some of the edges away but again that is necessary to get this to heal. Fortunately there is no evidence of active infection at this time. No fevers, chills, nausea, vomiting, or diarrhea. 09/16/2019 on evaluation today patient appears to be doing excellent in regard to his wound currently. In fact I do not see anything that we can need to debride today which is great news. The wound bed looks healthy and the edges of the wound look like they are doing great. Overall I feel like we are at a good place and headed in the proper direction. 09/23/2019 upon evaluation today patient appears to be doing okay in regard to his wound. This is not measuring any smaller it is about the same. With that being said he tells me that his wrap actually slid down he ended up having to take this off he was on a trip and did not have his Velcro compression with him. With that being said I  do not see any signs of anything significantly worsening which is good news. 09/30/2019 upon evaluation today patient appears to be doing a little bit better in regard to his leg ulcer. He has been tolerating the dressing changes without complication. Fortunately there is no signs of active infection at this time. No fevers, chills, nausea, vomiting, or diarrhea. 10/14/2019 upon evaluation today patient's wound appears to be healthier. Overall but unfortunately is measuring a little bit bigger. With that being said I do think that we do need to still continue to wrap his leg. Fortunately there is no signs of active infection at this time which is great news. 11/18/2019 upon evaluation today patient actually appears to be doing decently well with regard to his wound. He has been tolerating the dressing changes without complication. Fortunately there is no signs of active infection has been using just Neosporin at this point since he was in the hospital. He did have a venous Doppler study in the hospital though I cannot see the results that was from November 04, 2019. Nonetheless overall I am pleased with the fact the patient does seem to be doing better. 11/25/2019 on evaluation today patient appears to be doing well with regard to his wounds. He has been tolerating the dressing changes without complication. Fortunately there is no signs of active infection at this time. No fevers, chills, nausea, vomiting, or diarrhea. I do believe that he does not appear to be showing any signs of worsening although he also seems to be having some trouble getting this to proceed in a good fashion towards healing. 12/02/2019 on evaluation today patient actually appears to be doing not quite as well in regard to his wounds. Both have some slough noted on the surface of the wound. Unfortunately he is continuing to have some issues here with pain  as well in the anterior portion which I think is an issue. Fortunately there is  no signs of active infection at this time which is great news. No fevers, chills, nausea, vomiting, or diarrhea. 12/30/2019 upon evaluation today patient unfortunately has not been seen in the past month we are just now seeing him back for reevaluation today last time I saw him was August 25. At that point he had a lateral wound on his leg and an anterior wound which which is starting. The lateral has healed unfortunately the anterior looks worse. With that being said the patient unfortunately seems to be having some issues today that I cannot completely explain by just the way the wound appears. The wound itself is not really hot to touch and to be honest I would not necessarily assume infection upon initial inspection. With that being said he is acting very lethargic, was weak walking into the clinic according to his mother who is with him today, his blood pressure is actually in a normal range at around 110/73 which is unusual for him he normally runs extremely high in the 580D for systolic. His blood glucose was 227 here in the clinic today respiratory rate was 18 and his temperature was 97.7. Pulse was right around 70 when I checked this manually. Nonetheless although his vital signs and normal circumstances would be reassuring I am still kind of concerned just because of the way he is acting. I did question him about medications that he has taken he told me that he took 2 Tylenol and tramadol this morning he also tells me he took 2 tramadol last night. With that being said I do not believe that just taking the tramadol is accounting for what is going on as well based on what I see today. I discussed all this with his mother as well as the patient face-to-face in the office today and subsequently I think I would recommend further evaluation at the ER as I really feel like there is something going on here that I cannot identify just here in the clinic. 01/13/2020 upon evaluation today patient  presents for follow-up concerning ongoing issues with his right leg ulcer. After I last saw him 2 weeks ago he was actually sent to the ER where subsequently he ended up being placed on dialysis. He is doing better although he still seems very tired I think still in general he is doing much better. Fortunately there is no signs of active infection at this time. No fevers, chills, nausea, vomiting, or diarrhea. 01/26/2020; this is a patient with a large irregular albeit superficial wound on the right anterior lower leg. He has been using Santyl. He has recently been initiated on dialysis after we sent him to the ER earlier this month. He describes the wound is being very painful. By his description this started as a small raised area that rapidly expanded. He does not have a lot of obvious venous issues. Looking back through the records this was described as initially traumatic in April. He has seen vascular surgery and not felt to have an arterial issue 02/03/2020 upon evaluation today patient actually appears to be doing a little better in regard to his ulcer although it has spread inferiorly the central portion of the wound and towards the upper has actually new skin growth. Fortunately there is no signs of active infection at this time which is great news. 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 With that being said some of the more significant issues here such as angiosarcoma does not appear to be likely based on pathology report. It seems that were more in the arterial or possible calciphylaxis realm based on what I am seeing visually. 02/10/2020 on  evaluation today patient actually making some progress in regard to his leg ulcer. There is a lot of new skin growth which is excellent he tells me he still having a lot of pain but in general this seems to be showing signs of dramatic improvement which is great news. Overall I think that he is continuing to tolerate the Santyl along with ABD pad and roll gauze to secure. 02/17/2020 on evaluation today patient appears to be doing well in regard to his wound. He has been tolerating the dressing changes without complication. Fortunately there is no signs of active infection at this time which is great news and overall very pleased with where he stands today. 02/24/2020 upon evaluation today patient appears to be doing very well with regard to his leg ulcer. He has been tolerating the dressing changes with the Santyl without complication. Fortunately there is no signs of active infection at this time. No fevers, chills, nausea, vomiting, or diarrhea. 03/09/2020 upon evaluation today patient appears to be doing very well in regard to his ulcer. He has been tolerating the dressing changes without complication. Fortunately there is no signs of active infection at this time. Overall I think he is making great progress there is no depth to the wound and overall the patient seems to be feeling much better from a pain perspective as well. I am very pleased on all that he is doing so well. Electronic Signature(s) Signed: 03/09/2020 8:48:26 AM By: Worthy Keeler PA-C Entered By: Worthy Keeler on 03/09/2020 08:48:25 -------------------------------------------------------------------------------- Physical Exam Details Patient Name: Date of Service: [name], [name] 03/09/2020 8:00 A M Medical Record Number: [id number] Patient Account Number: [id number] Date of Birth/Sex: Treating RN: Oct 09, 1965 (54 y.o. [name] Primary Care Provider: Precious Haws Other Clinician: Referring Provider: Treating  Provider/Extender: [name], [name] in Treatment: 60 Constitutional Well-nourished and well-hydrated in no acute distress. Respiratory normal breathing without difficulty. Psychiatric this patient is able to make decisions and demonstrates good insight into disease process. Alert and Oriented x 3. pleasant and cooperative. Notes Patient's wound bed actually showed signs of good granulation epithelization. I do believe the Annitta Needs is doing a great job for that reason I would recommend that we continue with such with the current measures he is in agreement with the plan as is his mother who comes with him to his appointments at this point as well. Electronic Signature(s) Signed: 03/09/2020 8:48:40 AM By: Worthy Keeler PA-C Entered By: Worthy Keeler on 03/09/2020 08:48:40 -------------------------------------------------------------------------------- Physician Orders Details Patient Name: Date of Service: [name], [name] 03/09/2020 8:00 A M Medical Record Number: [id number] Patient Account Number: [id number] Date of Birth/Sex: Treating RN: 1965/05/05 (54 y.o. Erie Noe Primary Care Provider: Precious Haws Other Clinician: Referring Provider: Treating Provider/Extender: Katheran Awe Weeks in Treatment: 82 Verbal / Phone Orders: No Diagnosis Coding ICD-10 Coding Code Description S80.811D Abrasion, right lower leg, subsequent encounter L97.812 Non-pressure chronic ulcer of other part of  right lower leg with fat layer exposed E11.622 Type 2 diabetes mellitus with other skin ulcer I89.0 Lymphedema, not elsewhere classified I87.2 Venous insufficiency (chronic) (peripheral) Follow-up Appointments Return Appointment in 2 weeks. Dressing Change Frequency Wound #2 Right,Anterior Lower Leg Change dressing every day. Skin Barriers/Peri-Wound Care Moisturizing lotion - to leg daily Wound Cleansing May shower and wash wound with soap and water. -  use Dial antibacterial soap Primary Wound Dressing Wound #2 Right,Anterior Lower Leg Santyl Ointment Secondary Dressing Wound #2 Right,Anterior Lower Leg Kerlix/Rolled Gauze Dry Gauze ABD pad Edema Control Avoid standing for long periods of time Elevate legs to the level of the heart or above for 30 minutes daily and/or when sitting, a frequency of: - throughout the day Exercise regularly Electronic Signature(s) Signed: 03/09/2020 4:34:22 PM By: Worthy Keeler PA-C Signed: 03/10/2020 5:27:06 PM By: Rhae Hammock RN Entered By: Rhae Hammock on 03/09/2020 08:46:54 -------------------------------------------------------------------------------- Problem List Details Patient Name: Date of Service: [name], [name] 03/09/2020 8:00 A M Medical Record Number: [id number] Patient Account Number: [id number] Date of Birth/Sex: Treating RN: [birth year] (54 y.o. [name] Primary Care Provider: Precious Haws Other Clinician: Referring Provider: Treating Provider/Extender: Katheran Awe Weeks in Treatment: 31 Active Problems ICD-10 Encounter Encounter Code Description Active Date MDM Diagnosis S80.811D Abrasion, right lower leg, subsequent encounter 08/04/2019 No Yes L97.812 Non-pressure chronic ulcer of other part of right lower leg with fat layer 08/04/2019 No Yes exposed E11.622 Type 2 diabetes mellitus with other skin ulcer 08/04/2019 No Yes I89.0 Lymphedema, not elsewhere classified 08/04/2019 No Yes I87.2 Venous insufficiency (chronic) (peripheral) 09/30/2019 No Yes Inactive Problems Resolved Problems Electronic Signature(s) Signed: 03/09/2020 8:20:38 AM By: Worthy Keeler PA-C Entered By: Worthy Keeler on 03/09/2020 08:20:38 -------------------------------------------------------------------------------- Progress Note Details Patient Name: Date of Service: [name], [name] 03/09/2020 8:00 A M Medical Record Number: [id number] Patient Account Number:  [id number] Date of Birth/Sex: Treating RN: 01-19-1966 (54 y.o. [name] Primary Care Provider: Precious Haws Other Clinician: Referring Provider: Treating Provider/Extender: Katheran Awe Weeks in Treatment: 31 Subjective Chief Complaint Information obtained from Patient 08/04/2019; patient is here for review of the wound on the right lateral lower leg History of Present Illness (HPI) ADMISSION 08/04/2019 This is a 54 year old man who traumatized his right lateral lower leg while going [place] an escalator at the mall sometime in December 2020. He was referred to vascular surgery and on 04/14/2019 he saw Dr. Wenda Overland. He was felt to have "palpable pulses and normal noninvasive studies" although I cannot see these results. He was back in his primary doctor's office on 07/30/2019 for a nonhealing right lower extremity wound. He was referred to wound care I think in [facility] [place] but his insurance was out of network. A CNS was ordered. X-ray was done that was negative. He has been using wound cleanser and a Band-Aid. Past medical history is actually quite extensive and includes chronic kidney disease stage IV, chronic diastolic heart failure, atrial fibrillation, poorly controlled hypertension, poorly controlled diabetes with peripheral neuropathy and a recent hemoglobin A1c of 11.5, depression, lymphedema and sleep apnea ABI in our clinic was 1.1 in the right 5/6; this wound was initially traumatized while the patient was going down on an escalator. Arrived in clinic last week with a completely nonviable surface. He required mechanical debridement and we have been using Iodoflex under compression. Apparently the compression wraps fell down. He also has poorly controlled diabetes 5/15; traumatic/contusion wound to the  right lateral lower leg. We have been using Iodoflex to clean at the surface of this which was completely nonviable along with mechanical debridements. He is under  compression. 09/02/2019 upon evaluation today patient appears to be doing well with regard to his wound on his right lateral leg. He has been tolerating the dressing changes without complication. Fortunately there is no signs of active infection at this time. No fevers, chills, nausea, vomiting, or diarrhea. With that being said there is some necrotic tissue around the edges of the wound that is and require some sharp debridement today. That was discussed with the patient and his mother in the office today prior to debridement to which she did consent. 09/09/2019 upon evaluation today patient appears to be making progress in regard to his wound. Fortunately the wound is not nearly as deep as what it was. It is measuring a little bit larger due to having to clear some of the edges away but again that is necessary to get this to heal. Fortunately there is no evidence of active infection at this time. No fevers, chills, nausea, vomiting, or diarrhea. 09/16/2019 on evaluation today patient appears to be doing excellent in regard to his wound currently. In fact I do not see anything that we can need to debride today which is great news. The wound bed looks healthy and the edges of the wound look like they are doing great. Overall I feel like we are at a good place and headed in the proper direction. 09/23/2019 upon evaluation today patient appears to be doing okay in regard to his wound. This is not measuring any smaller it is about the same. With that being said he tells me that his wrap actually slid down he ended up having to take this off he was on a trip and did not have his Velcro compression with him. With that being said I do not see any signs of anything significantly worsening which is good news. 09/30/2019 upon evaluation today patient appears to be doing a little bit better in regard to his leg ulcer. He has been tolerating the dressing changes without complication. Fortunately there is no signs of  active infection at this time. No fevers, chills, nausea, vomiting, or diarrhea. 10/14/2019 upon evaluation today patient's wound appears to be healthier. Overall but unfortunately is measuring a little bit bigger. With that being said I do think that we do need to still continue to wrap his leg. Fortunately there is no signs of active infection at this time which is great news. 11/18/2019 upon evaluation today patient actually appears to be doing decently well with regard to his wound. He has been tolerating the dressing changes without complication. Fortunately there is no signs of active infection has been using just Neosporin at this point since he was in the hospital. He did have a venous Doppler study in the hospital though I cannot see the results that was from November 04, 2019. Nonetheless overall I am pleased with the fact the patient does seem to be doing better. 11/25/2019 on evaluation today patient appears to be doing well with regard to his wounds. He has been tolerating the dressing changes without complication. Fortunately there is no signs of active infection at this time. No fevers, chills, nausea, vomiting, or diarrhea. I do believe that he does not appear to be showing any signs of worsening although he also seems to be having some trouble getting this to proceed in a good fashion towards healing. 12/02/2019  on evaluation today patient actually appears to be doing not quite as well in regard to his wounds. Both have some slough noted on the surface of the wound. Unfortunately he is continuing to have some issues here with pain as well in the anterior portion which I think is an issue. Fortunately there is no signs of active infection at this time which is great news. No fevers, chills, nausea, vomiting, or diarrhea. 12/30/2019 upon evaluation today patient unfortunately has not been seen in the past month we are just now seeing him back for reevaluation today last time I saw him was August  25. At that point he had a lateral wound on his leg and an anterior wound which which is starting. The lateral has healed unfortunately the anterior looks worse. With that being said the patient unfortunately seems to be having some issues today that I cannot completely explain by just the way the wound appears. The wound itself is not really hot to touch and to be honest I would not necessarily assume infection upon initial inspection. With that being said he is acting very lethargic, was weak walking into the clinic according to his mother who is with him today, his blood pressure is actually in a normal range at around 110/73 which is unusual for him he normally runs extremely high in the 295A for systolic. His blood glucose was 227 here in the clinic today respiratory rate was 18 and his temperature was 97.7. Pulse was right around 70 when I checked this manually. Nonetheless although his vital signs and normal circumstances would be reassuring I am still kind of concerned just because of the way he is acting. I did question him about medications that he has taken he told me that he took 2 Tylenol and tramadol this morning he also tells me he took 2 tramadol last night. With that being said I do not believe that just taking the tramadol is accounting for what is going on as well based on what I see today. I discussed all this with his mother as well as the patient face-to-face in the office today and subsequently I think I would recommend further evaluation at the ER as I really feel like there is something going on here that I cannot identify just here in the clinic. 01/13/2020 upon evaluation today patient presents for follow-up concerning ongoing issues with his right leg ulcer. After I last saw him 2 weeks ago he was actually sent to the ER where subsequently he ended up being placed on dialysis. He is doing better although he still seems very tired I think still in general he is doing much  better. Fortunately there is no signs of active infection at this time. No fevers, chills, nausea, vomiting, or diarrhea. 01/26/2020; this is a patient with a large irregular albeit superficial wound on the right anterior lower leg. He has been using Santyl. He has recently been initiated on dialysis after we sent him to the ER earlier this month. He describes the wound is being very painful. By his description this started as a small raised area that rapidly expanded. He does not have a lot of obvious venous issues. Looking back through the records this was described as initially traumatic in April. He has seen vascular surgery and not felt to have an arterial issue 02/03/2020 upon evaluation today patient actually appears to be doing a little better in regard to his ulcer although it has spread inferiorly the central portion of  the wound and towards the upper has actually new skin growth. Fortunately there is no signs of active infection at this time which is great news. 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 With that being said some of the more significant issues here such as angiosarcoma does not appear to be likely based on pathology report. It seems that were more in the arterial or possible calciphylaxis realm based on what I am seeing visually. 02/10/2020 on evaluation today patient actually making some progress in regard to his leg ulcer. There is a lot of new skin growth which is excellent he tells me he still having a lot of pain but in general this seems to be showing signs of dramatic improvement which is great news. Overall I think that he is  continuing to tolerate the Santyl along with ABD pad and roll gauze to secure. 02/17/2020 on evaluation today patient appears to be doing well in regard to his wound. He has been tolerating the dressing changes without complication. Fortunately there is no signs of active infection at this time which is great news and overall very pleased with where he stands today. 02/24/2020 upon evaluation today patient appears to be doing very well with regard to his leg ulcer. He has been tolerating the dressing changes with the Santyl without complication. Fortunately there is no signs of active infection at this time. No fevers, chills, nausea, vomiting, or diarrhea. 03/09/2020 upon evaluation today patient appears to be doing very well in regard to his ulcer. He has been tolerating the dressing changes without complication. Fortunately there is no signs of active infection at this time. Overall I think he is making great progress there is no depth to the wound and overall the patient seems to be feeling much better from a pain perspective as well. I am very pleased on all that he is doing so well. Objective Constitutional Well-nourished and well-hydrated in no acute distress. Vitals Time Taken: 8:19 AM, Weight: 256 lbs, Temperature: 97.8 F, Pulse: 81 bpm, Respiratory Rate: 20 breaths/min, Blood Pressure: 133/86 mmHg, Capillary Blood Glucose: 119 mg/dl. Respiratory normal breathing without difficulty. Psychiatric this patient is able to make decisions and demonstrates good insight into disease process. Alert and Oriented x 3. pleasant and cooperative. General Notes: Patient's wound bed actually showed signs of good granulation epithelization. I do believe the Annitta Needs is doing a great job for that reason I would recommend that we continue with such with the current measures he is in agreement with the plan as is his mother who comes with him to his appointments at this point as well. Integumentary (Hair,  Skin) Wound #2 status is Open. Original cause of wound was Trauma. The wound is located on the Right,Anterior Lower Leg. The wound measures 5.3cm length x 4.1cm width x 0.1cm depth; 17.067cm^2 area and 1.707cm^3 volume. There is Fat Layer (Subcutaneous Tissue) exposed. There is no tunneling or undermining noted. There is a medium amount of serosanguineous drainage noted. The wound margin is flat and intact. There is large (67-100%) pink granulation within the wound bed. There is a small (1-33%) amount of necrotic tissue within the wound bed including Adherent Slough. 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 There was a Chemical/Enzymatic/Mechanical debridement performed by Worthy Keeler, PA.Marland Kitchen Agent used was [employer]. A time out was conducted at 08:45, prior to the start of the procedure. There was no bleeding. The procedure was tolerated well. Post Debridement Measurements: 5.3cm length x 4.1cm width x 0.1cm depth; 1.707cm^3 volume. Character of Wound/Ulcer Post Debridement is improved. Severity of Tissue Post Debridement is: Fat layer exposed. Post procedure Diagnosis Wound #2: Same as Pre-Procedure Plan Follow-up Appointments: Return Appointment in 2 weeks. Dressing Change Frequency: Wound #2 Right,Anterior Lower Leg: Change dressing every day. Skin Barriers/Peri-Wound Care: Moisturizing lotion - to leg daily Wound Cleansing: May shower and wash wound with soap and water. - use Dial antibacterial soap Primary Wound Dressing: Wound #2 Right,Anterior Lower  Leg: Santyl Ointment Secondary Dressing: Wound #2 Right,Anterior Lower Leg: Kerlix/Rolled Gauze Dry Gauze ABD pad Edema Control: Avoid standing for long periods of time Elevate legs to the level of the heart or above for 30 minutes daily and/or when sitting, a frequency of: - throughout the day Exercise regularly 1. I recommend currently that we continue with the wound care measures as before and the patient is in agreement with the plan. This includes the use of a continuation of a Santyl dressing to the wound bed followed by Kerlix and roll gauze and an ABD pad. 2. The patient should elevate his legs much as possible try to keep edema under good control. 3. I have no limitations on his exercise or activity I think he can walk and do what he wants as much as he wants. We will see patient back for reevaluation in 2 weeks here in the clinic. If anything worsens or changes patient will contact our office for additional recommendations. Electronic Signature(s) Signed: 03/09/2020 8:49:38 AM By: Worthy Keeler PA-C Entered By: Worthy Keeler on 03/09/2020 08:49:37 -------------------------------------------------------------------------------- SuperBill Details Patient Name: Date of Service: [name], [name] 03/09/2020 Medical Record Number: [id number] Patient Account Number: [id number] Date of Birth/Sex: Treating RN: [birth year] (54 y.o. [name], [name] Primary Care Provider: Precious Haws Other Clinician: Referring Provider: Treating Provider/Extender: [name], [name] in Treatment: 31 Diagnosis Coding ICD-10 Codes Code Description [phone] Abrasion, right lower leg, subsequent encounter L97.812 Non-pressure chronic ulcer of other part of right lower leg with fat layer exposed E11.622 Type 2 diabetes mellitus with other skin ulcer I89.0 Lymphedema, not elsewhere classified I87.2 Venous insufficiency (chronic) (peripheral) Facility Procedures CPT4 Code:  [id number] Description: [phone] - DEBRIDE W/O ANES NON SELECT Modifier: Quantity: 1 Physician Procedures : CPT4 Code Description Modifier [id number] - WC PHYS LEVEL 3 - EST PT ICD-10 Diagnosis Description S80.811D Abrasion, right lower leg, subsequent encounter L97.812 Non-pressure chronic ulcer of other part of right lower leg with fat layer exposed  E11.622 Type 2 diabetes mellitus with other skin ulcer I89.0 Lymphedema, not elsewhere classified Quantity: 1 Electronic Signature(s) Signed: 03/09/2020 8:49:52 AM By: Worthy Keeler PA-C Entered By: Worthy Keeler on 03/09/2020 08:49:52

## 2020-03-23 ENCOUNTER — Encounter (HOSPITAL_BASED_OUTPATIENT_CLINIC_OR_DEPARTMENT_OTHER): Payer: Medicare Other | Admitting: Physician Assistant

## 2020-03-23 ENCOUNTER — Other Ambulatory Visit: Payer: Self-pay

## 2020-03-23 DIAGNOSIS — E11622 Type 2 diabetes mellitus with other skin ulcer: Secondary | ICD-10-CM | POA: Diagnosis not present

## 2020-03-23 NOTE — Progress Notes (Signed)
[name] ([id number]) Visit Report for 03/23/2020 Arrival Information Details Patient Name: Date of Service: [name] 03/23/2020 8:00 A M Medical Record Number: [id number] Patient Account Number: [id number] Date of Birth/Sex: Treating [name]: 08-18-1965 (53 y.o. [name], [name] Primary Care [name]: [name] [name] Visit Information History Since Last Visit Added or deleted any medications: No Patient Arrived: Ambulatory Any new allergies or adverse reactions: No Arrival Time: 08:24 Had a fall or experienced change in No Accompanied By: self activities of daily living that may affect Transfer Assistance: None risk of falls: Patient Identification Verified: Yes Signs or symptoms of abuse/neglect since last visito No Secondary Verification Process Completed: Yes Hospitalized since last visit: No Patient Requires Transmission-Based Precautions: No Implantable device outside of the clinic excluding No Patient Has Alerts: Yes cellular tissue based products placed in the center Patient Alerts: R ABI =1.19, TBI = .93 since last visit: L ABI = 1.29, TBI = .86 Has Dressing in Place as Prescribed: Yes Pain Present Now: No Electronic Signature(s) Signed: 03/23/2020 9:59:17 AM By: [name] Entered By: [name] on 03/23/2020 08:24:49 -------------------------------------------------------------------------------- Encounter Discharge Information Details Patient Name: Date of Service: [name] 03/23/2020 8:00 A M Medical Record Number: [id number] Patient Account Number: [id number] Date of Birth/Sex: Treating [name]: December 21, 1965 (54 y.o. [name]) [name] Primary Care [name]: [name] [name] [name] in Treatment: 33 Encounter Discharge Information Items  Post Procedure Vitals Discharge Condition: Stable Temperature (F): 98.4 Ambulatory Status: Ambulatory Pulse (bpm): 74 Discharge Destination: Home Respiratory Rate (breaths/min): 18 Transportation: Private Auto Blood Pressure (mmHg): 143/94 Accompanied By: self Schedule Follow-up Appointment: Yes Clinical Summary of Care: Patient Declined Electronic Signature(s) Signed: 03/23/2020 5:05:31 PM By: [name] Coria [name] Entered By: [name] on 03/23/2020 09:18:01 -------------------------------------------------------------------------------- Lower Extremity Assessment Details Patient Name: Date of Service: [name] 03/23/2020 8:00 A M Medical Record Number: [id number] Patient Account Number: [id number] Date of Birth/Sex: Treating [name]: 1966-04-07 (54 y.o. [name], [name] Primary Care [name]: [name] [name] [name] in Treatment: 33 Edema Assessment Assessed: [Left: No] [Right: No] Edema: [Left: N] [Right: o] Calf Left: Right: Point of Measurement: 31 cm From Medial Instep 42 cm Ankle Left: Right: Point of Measurement: 8 cm From Medial Instep 25 cm Vascular Assessment Pulses: Dorsalis Pedis Palpable: [Right:Yes] Posterior Tibial Palpable: [Right:Yes] Electronic Signature(s) Signed: 03/23/2020 9:59:17 AM By: [name] Entered By: [name] on 03/23/2020 08:28:54 -------------------------------------------------------------------------------- [place] Details Patient Name: Date of Service: [name] 03/23/2020 8:00 A M Medical Record Number: [id number] Patient Account Number: [id number] Date of Birth/Sex: Treating [name]: [birth year] (54 y.o. [name] Primary Care Tatijana Bierly: [name] [name] [name] in Treatment: 33 Active  Inactive Venous Leg Ulcer Nursing Diagnoses: Knowledge deficit related to disease process and management Potential for venous Insuffiency (use before diagnosis confirmed) Goals: Patient will maintain optimal edema control Date Initiated: 09/09/2019 Target Resolution Date: 04/07/2020 Goal Status: Active Patient/caregiver will verbalize understanding of disease process and disease management Date Initiated: 09/09/2019 Date Inactivated: 10/14/2019 Target Resolution Date: 10/07/2019 Goal Status: Met Interventions: Assess peripheral edema status every visit. Compression as ordered Provide education on venous insufficiency Treatment Activities: Therapeutic compression applied : 09/09/2019 Notes: Wound/Skin Impairment Nursing Diagnoses: Knowledge deficit related  to ulceration/compromised skin integrity Goals: Patient/caregiver will verbalize understanding of skin care regimen Date Initiated: 08/04/2019 Target Resolution Date: 04/20/2020 Goal Status: Active Ulcer/skin breakdown will have a volume reduction of 30% by week 4 Date Initiated: 08/04/2019 Date Inactivated: 09/09/2019 Target Resolution Date: 09/11/2019 Goal Status: Met Ulcer/skin breakdown will have a volume reduction of 50% by week 8 Date Initiated: 09/09/2019 Date Inactivated: 10/14/2019 Target Resolution Date: 10/07/2019 Goal Status: Unmet Unmet Reason: awaiting reflux studies Ulcer/skin breakdown will have a volume reduction of 80% by week 12 Date Initiated: 10/14/2019 Date Inactivated: 11/18/2019 Target Resolution Date: 11/04/2019 Goal Status: Unmet Unmet Reason: uncontrolled htn Interventions: Assess patient/caregiver ability to obtain necessary supplies Assess patient/caregiver ability to perform ulcer/skin care regimen upon admission and as needed Assess ulceration(s) every visit Notes: Electronic Signature(s) Signed: 03/23/2020 5:29:15 PM By: [name] Entered By: [name] on 03/23/2020  09:03:26 -------------------------------------------------------------------------------- Pain Assessment Details Patient Name: Date of Service: [name] 03/23/2020 8:00 A M Medical Record Number: [id number] Patient Account Number: [id number] Date of Birth/Sex: Treating [name]: [birth year] (54 y.o. [name] Primary Care Mekisha Bittel: [name] [name] Clinician: Referring Santiago Stenzel: Treating Braedan Meuth/Extender: [name], [name] [name] in Treatment: 33 Active Problems Location of Pain Severity and Description of Pain Patient Has Paino Yes Site Locations With Dressing Change: Yes Duration of the Pain. Constant / Intermittento Intermittent Rate the pain. Current Pain Level: 5 Worst Pain Level: 10 Least Pain Level: 0 Tolerable Pain Level: 7 Character of Pain Describe the Pain: Aching Pain Management and Medication Current Pain Management: Medication: Yes Cold Application: No Rest: Yes Massage: No Activity: No T.E.N.S.: No Heat Application: No Leg drop or elevation: No Is the Current Pain Management Adequate: Adequate How does your wound impact your activities of daily livingo Sleep: No Bathing: No Appetite: No Relationship With Others: No Bladder Continence: No Emotions: No Bowel Continence: No Work: No Toileting: No Drive: No Dressing: No Hobbies: No Electronic Signature(s) Signed: 03/23/2020 9:59:17 AM By: [name] Entered By: [name] on 03/23/2020 08:27:46 -------------------------------------------------------------------------------- Patient/Caregiver Education Details Patient Name: Date of Service: [name] 12/15/2021andnbsp8:00 A M Medical Record Number: [id number] Patient Account Number: [id number] Date of Birth/Gender: Treating [name]: 06-15-1965 (54 y.o. [name] Primary Care Physician: [name] [name] Clinician: Referring Physician: Treating Physician/Extender: Burnard Hawthorne in Treatment: 35 Education Assessment Education Provided To: Patient Education Topics Provided Wound/Skin Impairment: Methods: Explain/Verbal Responses: Reinforcements needed, State content correctly Electronic Signature(s) Signed: 03/23/2020 5:29:15 PM By: [name] Entered By: [name] on 03/23/2020 09:03:57 -------------------------------------------------------------------------------- Wound Assessment Details Patient Name: Date of Service: [name] 03/23/2020 8:00 A M Medical Record Number: [id number] Patient Account Number: [id number] Date of Birth/Sex: Treating [name]: Sep 11, 1965 (54 y.o. [name], [name] Primary Care Kegan Shepardson: [name] [name] Clinician: Referring Carron Mcmurry: Treating Charlene Detter/Extender: [name], [name] [name] in Treatment: 33 Wound Status Wound Number: 2 Primary Venous Leg Ulcer Etiology: Wound Location: Right, Anterior Lower Leg Wound Open Wounding Event: Trauma Status: Date Acquired: 11/30/2019 Comorbid Lymphedema, Sleep Apnea, Arrhythmia, Congestive Heart Failure, [name] Of Treatment: 16 History: Hypertension, Peripheral Arterial Disease, Type II Diabetes Clustered Wound: No Wound Measurements Length: (cm) 1.2 Width: (cm) 1.3 Depth: (cm) 0.1 Area: (cm) 1.225 Volume: (cm) 0.123 % Reduction in Area: 76.8% % Reduction in Volume: 76.7% Epithelialization: Large (67-100%) Tunneling: No Undermining: No Wound Description Classification: Full Thickness Without Exposed Support Structures Wound Margin: Flat and Intact Exudate Amount: Medium  Exudate Type: Serosanguineous Exudate Color: red, brown Foul Odor After Cleansing: No Slough/Fibrino Yes Wound Bed Granulation Amount: Large (67-100%) Exposed Structure Granulation Quality: Pink Fascia Exposed: No Necrotic Amount: Small (1-33%) Fat Layer (Subcutaneous Tissue) Exposed: Yes Necrotic Quality: Adherent Slough Tendon Exposed: No Muscle  Exposed: No Joint Exposed: No Bone Exposed: No Treatment Notes Wound #2 (Lower Leg) Wound Laterality: Right, Anterior Cleanser Peri-Wound Care Topical Primary Dressing Santyl Ointment Discharge Instruction: Apply nickel thick amount to wound bed as instructed Secondary Dressing ABD Pad, 8x10 Discharge Instruction: Apply over primary dressing as directed. Secured With [employer], 4.5x3.1 (in/yd) Discharge Instruction: Secure with Kerlix as directed. Compression Wrap Compression Stockings Add-Ons Electronic Signature(s) Signed: 03/23/2020 9:59:17 AM By: [name] Entered By: [name] on 03/23/2020 08:33:30 -------------------------------------------------------------------------------- Vitals Details Patient Name: Date of Service: [name] 03/23/2020 8:00 A M Medical Record Number: [id number] Patient Account Number: [id number] Date of Birth/Sex: Treating [name]: 1965/12/09 (54 y.o. [name], [name] Primary Care Vergia Chea: [name] [name] Clinician: Referring Natasha Burda: Treating Mystique Bjelland/Extender: [name], [name] [name] in Treatment: 33 Vital Signs Time Taken: 08:24 Temperature (F): 98.4 Weight (lbs): 256 Pulse (bpm): 74 Respiratory Rate (breaths/min): 18 Blood Pressure (mmHg): 143/94 Capillary Blood Glucose (mg/dl): 130 Reference Range: 80 - 120 mg / dl Electronic Signature(s) Signed: 03/23/2020 9:59:17 AM By: [name] Entered By: [name] on 03/23/2020 08:27:02

## 2020-03-23 NOTE — Progress Notes (Addendum)
Battaglini, SHAMARR ([id number]) Visit Report for 03/23/2020 Chief Complaint Document Details Patient Name: Date of Service: [name], [name] 03/23/2020 8:00 A M Medical Record Number: [id number] Patient Account Number: [id number] Date of Birth/Sex: Treating RN: 07-13-1965 (54 y.o. [name] Primary Care Provider: Precious Haws Other Clinician: Referring Provider: Treating Provider/Extender: Katheran Awe Weeks in Treatment: 31 Information Obtained from: Patient Chief Complaint 08/04/2019; patient is here for review of the wound on the right lateral lower leg Electronic Signature(s) Signed: 03/23/2020 8:27:42 AM By: Worthy Keeler PA-C Entered By: Worthy Keeler on 03/23/2020 08:27:42 -------------------------------------------------------------------------------- Debridement Details Patient Name: Date of Service: [name], [name] 03/23/2020 8:00 A M Medical Record Number: [id number] Patient Account Number: [id number] Date of Birth/Sex: Treating RN: [birth year] (54 y.o. [name] Primary Care Provider: Precious Haws Other Clinician: Referring Provider: Treating Provider/Extender: Katheran Awe Weeks in Treatment: 33 Debridement Performed for Assessment: Wound #2 Right,Anterior Lower Leg Performed By: Jake Church, RN Debridement Type: Chemical/Enzymatic/Mechanical Agent Used: Santyl Severity of Tissue Pre Debridement: Fat layer exposed Level of Consciousness (Pre-procedure): Awake and Alert Pre-procedure Verification/Time Out Yes - 09:05 Taken: Bleeding: None Response to Treatment: Procedure was tolerated well Level of Consciousness (Post- Awake and Alert procedure): Post Debridement Measurements of Total Wound Length: (cm) 1.2 Width: (cm) 1.3 Depth: (cm) 0.1 Volume: (cm) 0.123 Character of Wound/Ulcer Post Debridement: Improved Severity of Tissue Post Debridement: Fat layer exposed Post Procedure Diagnosis Same as  Pre-procedure Electronic Signature(s) Signed: 03/23/2020 1:05:14 PM By: Worthy Keeler PA-C Signed: 03/23/2020 5:29:15 PM By: Baruch Gouty RN, BSN Entered By: Baruch Gouty on 03/23/2020 09:05:47 -------------------------------------------------------------------------------- HPI Details Patient Name: Date of Service: [name], [name] 03/23/2020 8:00 A M Medical Record Number: [id number] Patient Account Number: [id number] Date of Birth/Sex: Treating RN: 08/14/1965 (54 y.o. [name] Primary Care Provider: Precious Haws Other Clinician: Referring Provider: Treating Provider/Extender: Katheran Awe Weeks in Treatment: 71 History of Present Illness HPI Description: ADMISSION 08/04/2019 This is a 54 year old man who traumatized his right lateral lower leg while going up an escalator at the mall sometime in December 2020. He was referred to vascular surgery and on 04/14/2019 he saw Dr. Wenda Overland. He was felt to have "palpable pulses and normal noninvasive studies" although I cannot see these results. He was back in his primary doctor's office on 07/30/2019 for a nonhealing right lower extremity wound. He was referred to wound care I think in [facility] but his insurance was out of network. A CNS was ordered. X-ray was done that was negative. He has been using wound cleanser and a Band-Aid. Past medical history is actually quite extensive and includes chronic kidney disease stage IV, chronic diastolic heart failure, atrial fibrillation, poorly controlled hypertension, poorly controlled diabetes with peripheral neuropathy and a recent hemoglobin A1c of 11.5, depression, lymphedema and sleep apnea ABI in our clinic was 1.1 in the right 5/6; this wound was initially traumatized while the patient was going down on an escalator. Arrived in clinic last week with a completely nonviable surface. He required mechanical debridement and we have been using Iodoflex under compression.  Apparently the compression wraps fell down. He also has poorly controlled diabetes 5/15; traumatic/contusion wound to the right lateral lower leg. We have been using Iodoflex to clean at the surface of this which was completely nonviable along with mechanical debridements. He is under compression. 09/02/2019 upon evaluation today patient appears to be doing well with  regard to his wound on his right lateral leg. He has been tolerating the dressing changes without complication. Fortunately there is no signs of active infection at this time. No fevers, chills, nausea, vomiting, or diarrhea. With that being said there is some necrotic tissue around the edges of the wound that is and require some sharp debridement today. That was discussed with the patient and his mother in the office today prior to debridement to which she did consent. 09/09/2019 upon evaluation today patient appears to be making progress in regard to his wound. Fortunately the wound is not nearly as deep as what it was. It is measuring a little bit larger due to having to clear some of the edges away but again that is necessary to get this to heal. Fortunately there is no evidence of active infection at this time. No fevers, chills, nausea, vomiting, or diarrhea. 09/16/2019 on evaluation today patient appears to be doing excellent in regard to his wound currently. In fact I do not see anything that we can need to debride today which is great news. The wound bed looks healthy and the edges of the wound look like they are doing great. Overall I feel like we are at a good place and headed in the proper direction. 09/23/2019 upon evaluation today patient appears to be doing okay in regard to his wound. This is not measuring any smaller it is about the same. With that being said he tells me that his wrap actually slid down he ended up having to take this off he was on a trip and did not have his Velcro compression with him. With that being said I  do not see any signs of anything significantly worsening which is good news. 09/30/2019 upon evaluation today patient appears to be doing a little bit better in regard to his leg ulcer. He has been tolerating the dressing changes without complication. Fortunately there is no signs of active infection at this time. No fevers, chills, nausea, vomiting, or diarrhea. 10/14/2019 upon evaluation today patient's wound appears to be healthier. Overall but unfortunately is measuring a little bit bigger. With that being said I do think that we do need to still continue to wrap his leg. Fortunately there is no signs of active infection at this time which is great news. 11/18/2019 upon evaluation today patient actually appears to be doing decently well with regard to his wound. He has been tolerating the dressing changes without complication. Fortunately there is no signs of active infection has been using just Neosporin at this point since he was in the hospital. He did have a venous Doppler study in the hospital though I cannot see the results that was from November 04, 2019. Nonetheless overall I am pleased with the fact the patient does seem to be doing better. 11/25/2019 on evaluation today patient appears to be doing well with regard to his wounds. He has been tolerating the dressing changes without complication. Fortunately there is no signs of active infection at this time. No fevers, chills, nausea, vomiting, or diarrhea. I do believe that he does not appear to be showing any signs of worsening although he also seems to be having some trouble getting this to proceed in a good fashion towards healing. 12/02/2019 on evaluation today patient actually appears to be doing not quite as well in regard to his wounds. Both have some slough noted on the surface of the wound. Unfortunately he is continuing to have some issues here with pain  as well in the anterior portion which I think is an issue. Fortunately there is  no signs of active infection at this time which is great news. No fevers, chills, nausea, vomiting, or diarrhea. 12/30/2019 upon evaluation today patient unfortunately has not been seen in the past month we are just now seeing him back for reevaluation today last time I saw him was August 25. At that point he had a lateral wound on his leg and an anterior wound which which is starting. The lateral has healed unfortunately the anterior looks worse. With that being said the patient unfortunately seems to be having some issues today that I cannot completely explain by just the way the wound appears. The wound itself is not really hot to touch and to be honest I would not necessarily assume infection upon initial inspection. With that being said he is acting very lethargic, was weak walking into the clinic according to his mother who is with him today, his blood pressure is actually in a normal range at around 110/73 which is unusual for him he normally runs extremely high in the 510C for systolic. His blood glucose was 227 here in the clinic today respiratory rate was 18 and his temperature was 97.7. Pulse was right around 70 when I checked this manually. Nonetheless although his vital signs and normal circumstances would be reassuring I am still kind of concerned just because of the way he is acting. I did question him about medications that he has taken he told me that he took 2 Tylenol and tramadol this morning he also tells me he took 2 tramadol last night. With that being said I do not believe that just taking the tramadol is accounting for what is going on as well based on what I see today. I discussed all this with his mother as well as the patient face-to-face in the office today and subsequently I think I would recommend further evaluation at the ER as I really feel like there is something going on here that I cannot identify just here in the clinic. 01/13/2020 upon evaluation today patient  presents for follow-up concerning ongoing issues with his right leg ulcer. After I last saw him 2 weeks ago he was actually sent to the ER where subsequently he ended up being placed on dialysis. He is doing better although he still seems very tired I think still in general he is doing much better. Fortunately there is no signs of active infection at this time. No fevers, chills, nausea, vomiting, or diarrhea. 01/26/2020; this is a patient with a large irregular albeit superficial wound on the right anterior lower leg. He has been using Santyl. He has recently been initiated on dialysis after we sent him to the ER earlier this month. He describes the wound is being very painful. By his description this started as a small raised area that rapidly expanded. He does not have a lot of obvious venous issues. Looking back through the records this was described as initially traumatic in April. He has seen vascular surgery and not felt to have an arterial issue 02/03/2020 upon evaluation today patient actually appears to be doing a little better in regard to his ulcer although it has spread inferiorly the central portion of the wound and towards the upper has actually new skin growth. Fortunately there is no signs of active infection at this time which is great news. 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 With that being said some of the more significant issues here such as angiosarcoma does not appear to be likely based on pathology report. It seems that were more in the arterial or possible calciphylaxis realm based on what I am seeing visually. 02/10/2020 on  evaluation today patient actually making some progress in regard to his leg ulcer. There is a lot of new skin growth which is excellent he tells me he still having a lot of pain but in general this seems to be showing signs of dramatic improvement which is great news. Overall I think that he is continuing to tolerate the Santyl along with ABD pad and roll gauze to secure. 02/17/2020 on evaluation today patient appears to be doing well in regard to his wound. He has been tolerating the dressing changes without complication. Fortunately there is no signs of active infection at this time which is great news and overall very pleased with where he stands today. 02/24/2020 upon evaluation today patient appears to be doing very well with regard to his leg ulcer. He has been tolerating the dressing changes with the Santyl without complication. Fortunately there is no signs of active infection at this time. No fevers, chills, nausea, vomiting, or diarrhea. 03/09/2020 upon evaluation today patient appears to be doing very well in regard to his ulcer. He has been tolerating the dressing changes without complication. Fortunately there is no signs of active infection at this time. Overall I think he is making great progress there is no depth to the wound and overall the patient seems to be feeling much better from a pain perspective as well. I am very pleased on all that he is doing so well. 03/23/2020 on evaluation today patient appears to be doing well with regard to his lower extremity ulcer. This is showing signs of excellent improvement he has just a very small area on the inferior portion of the wound that is still open. He still has pain but not nearly as bad as it was. Electronic Signature(s) Signed: 03/23/2020 9:08:02 AM By: Worthy Keeler PA-C Entered By: Worthy Keeler on 03/23/2020 09:08:02 -------------------------------------------------------------------------------- Physical Exam Details Patient  Name: Date of Service: [name], [name] 03/23/2020 8:00 A M Medical Record Number: [id number] Patient Account Number: [id number] Date of Birth/Sex: Treating RN: [birth year] (54 y.o. [name] Primary Care Provider: Precious Haws Other Clinician: Referring Provider: Treating Provider/Extender: [name], [name] in Treatment: 42 Constitutional Well-nourished and well-hydrated in no acute distress. Respiratory normal breathing without difficulty. Psychiatric this patient is able to make decisions and demonstrates good insight into disease process. Alert and Oriented x 3. pleasant and cooperative. Notes Patient's wound bed actually showed signs of good granulation at this time. There was minimal slough noted I remove this which is mechanical debridement with saline and gauze he tolerated that without complication. Electronic Signature(s) Signed: 03/23/2020 9:08:24 AM By: Worthy Keeler PA-C Entered By: Worthy Keeler on 03/23/2020 09:08:23 -------------------------------------------------------------------------------- Physician Orders Details Patient Name: Date of Service: [name], [name] 03/23/2020 8:00 A M Medical Record Number: [id number] Patient Account Number: [id number] Date of Birth/Sex: Treating RN: [birth year] (54 y.o. [name] Primary Care Provider: Precious Haws Other Clinician: Referring Provider: Treating Provider/Extender: [name], [name]  in Treatment: 33 Verbal / Phone Orders: No Diagnosis Coding ICD-10 Coding Code Description S80.811D Abrasion, right lower leg, subsequent encounter L97.812 Non-pressure chronic ulcer of other part of right lower leg with fat layer exposed E11.622 Type 2 diabetes mellitus with other skin ulcer I89.0 Lymphedema, not elsewhere classified I87.2 Venous insufficiency (chronic) (peripheral) Follow-up Appointments Return Appointment in 1 week. Bathing/ Shower/ Hygiene May shower and wash  wound with soap and water. Edema Control - Lymphedema / SCD / Other Bilateral Lower Extremities Elevate legs to the level of the heart or above for 30 minutes daily and/or when sitting, a frequency of: Avoid standing for long periods of time. Exercise regularly Moisturize legs daily. Wound Treatment Wound #2 - Lower Leg Wound Laterality: Right, Anterior Prim Dressing: Santyl Ointment 1 x Per Day ary Discharge Instructions: Apply nickel thick amount to wound bed as instructed Secondary Dressing: ABD Pad, 8x10 1 x Per Day Discharge Instructions: Apply over primary dressing as directed. Secured With: [employer], 4.5x3.1 (in/yd) 1 x Per Day Discharge Instructions: Secure with Kerlix as directed. Electronic Signature(s) Signed: 03/23/2020 1:05:14 PM By: Worthy Keeler PA-C Signed: 03/23/2020 5:29:15 PM By: Baruch Gouty RN, BSN Entered By: Baruch Gouty on 03/23/2020 09:08:52 -------------------------------------------------------------------------------- Problem List Details Patient Name: Date of Service: [name], [name] 03/23/2020 8:00 A M Medical Record Number: [id number] Patient Account Number: [id number] Date of Birth/Sex: Treating RN: 1965-05-07 (54 y.o. [name] Primary Care Provider: Precious Haws Other Clinician: Referring Provider: Treating Provider/Extender: Katheran Awe Weeks in Treatment: 33 Active Problems ICD-10 Encounter Code Description Active Date MDM Diagnosis S80.811D Abrasion, right lower leg, subsequent encounter 08/04/2019 No Yes L97.812 Non-pressure chronic ulcer of other part of right lower leg with fat layer 08/04/2019 No Yes exposed E11.622 Type 2 diabetes mellitus with other skin ulcer 08/04/2019 No Yes I89.0 Lymphedema, not elsewhere classified 08/04/2019 No Yes I87.2 Venous insufficiency (chronic) (peripheral) 09/30/2019 No Yes Inactive Problems Resolved Problems Electronic Signature(s) Signed: 03/23/2020 8:27:37  AM By: Worthy Keeler PA-C Entered By: Worthy Keeler on 03/23/2020 08:27:37 -------------------------------------------------------------------------------- Progress Note Details Patient Name: Date of Service: [name], [name] 03/23/2020 8:00 A M Medical Record Number: [id number] Patient Account Number: [id number] Date of Birth/Sex: Treating RN: [birth year] (54 y.o. [name] Primary Care Provider: Precious Haws Other Clinician: Referring Provider: Treating Provider/Extender: Katheran Awe Weeks in Treatment: 70 Subjective Chief Complaint Information obtained from Patient 08/04/2019; patient is here for review of the wound on the right lateral lower leg History of Present Illness (HPI) ADMISSION 08/04/2019 This is a 54 year old man who traumatized his right lateral lower leg while going up an escalator at the mall sometime in December 2020. He was referred to vascular surgery and on 04/14/2019 he saw Dr. Wenda Overland. He was felt to have "palpable pulses and normal noninvasive studies" although I cannot see these results. He was back in his primary doctor's office on 07/30/2019 for a nonhealing right lower extremity wound. He was referred to wound care I think in [facility] but his insurance was out of network. A CNS was ordered. X-ray was done that was negative. He has been using wound cleanser and a Band-Aid. Past medical history is actually quite extensive and includes chronic kidney disease stage IV, chronic diastolic heart failure, atrial fibrillation, poorly controlled hypertension, poorly controlled diabetes with peripheral neuropathy and a recent hemoglobin A1c of 11.5, depression, lymphedema and sleep apnea ABI in our clinic was 1.1 in the  right 5/6; this wound was initially traumatized while the patient was going down on an escalator. Arrived in clinic last week with a completely nonviable surface. He required mechanical debridement and we have been using Iodoflex  under compression. Apparently the compression wraps fell down. He also has poorly controlled diabetes 5/15; traumatic/contusion wound to the right lateral lower leg. We have been using Iodoflex to clean at the surface of this which was completely nonviable along with mechanical debridements. He is under compression. 09/02/2019 upon evaluation today patient appears to be doing well with regard to his wound on his right lateral leg. He has been tolerating the dressing changes without complication. Fortunately there is no signs of active infection at this time. No fevers, chills, nausea, vomiting, or diarrhea. With that being said there is some necrotic tissue around the edges of the wound that is and require some sharp debridement today. That was discussed with the patient and his mother in the office today prior to debridement to which she did consent. 09/09/2019 upon evaluation today patient appears to be making progress in regard to his wound. Fortunately the wound is not nearly as deep as what it was. It is measuring a little bit larger due to having to clear some of the edges away but again that is necessary to get this to heal. Fortunately there is no evidence of active infection at this time. No fevers, chills, nausea, vomiting, or diarrhea. 09/16/2019 on evaluation today patient appears to be doing excellent in regard to his wound currently. In fact I do not see anything that we can need to debride today which is great news. The wound bed looks healthy and the edges of the wound look like they are doing great. Overall I feel like we are at a good place and headed in the proper direction. 09/23/2019 upon evaluation today patient appears to be doing okay in regard to his wound. This is not measuring any smaller it is about the same. With that being said he tells me that his wrap actually slid down he ended up having to take this off he was on a trip and did not have his Velcro compression with  him. With that being said I do not see any signs of anything significantly worsening which is good news. 09/30/2019 upon evaluation today patient appears to be doing a little bit better in regard to his leg ulcer. He has been tolerating the dressing changes without complication. Fortunately there is no signs of active infection at this time. No fevers, chills, nausea, vomiting, or diarrhea. 10/14/2019 upon evaluation today patient's wound appears to be healthier. Overall but unfortunately is measuring a little bit bigger. With that being said I do think that we do need to still continue to wrap his leg. Fortunately there is no signs of active infection at this time which is great news. 11/18/2019 upon evaluation today patient actually appears to be doing decently well with regard to his wound. He has been tolerating the dressing changes without complication. Fortunately there is no signs of active infection has been using just Neosporin at this point since he was in the hospital. He did have a venous Doppler study in the hospital though I cannot see the results that was from November 04, 2019. Nonetheless overall I am pleased with the fact the patient does seem to be doing better. 11/25/2019 on evaluation today patient appears to be doing well with regard to his wounds. He has been tolerating the dressing changes  without complication. Fortunately there is no signs of active infection at this time. No fevers, chills, nausea, vomiting, or diarrhea. I do believe that he does not appear to be showing any signs of worsening although he also seems to be having some trouble getting this to proceed in a good fashion towards healing. 12/02/2019 on evaluation today patient actually appears to be doing not quite as well in regard to his wounds. Both have some slough noted on the surface of the wound. Unfortunately he is continuing to have some issues here with pain as well in the anterior portion which I think is an issue.  Fortunately there is no signs of active infection at this time which is great news. No fevers, chills, nausea, vomiting, or diarrhea. 12/30/2019 upon evaluation today patient unfortunately has not been seen in the past month we are just now seeing him back for reevaluation today last time I saw him was August 25. At that point he had a lateral wound on his leg and an anterior wound which which is starting. The lateral has healed unfortunately the anterior looks worse. With that being said the patient unfortunately seems to be having some issues today that I cannot completely explain by just the way the wound appears. The wound itself is not really hot to touch and to be honest I would not necessarily assume infection upon initial inspection. With that being said he is acting very lethargic, was weak walking into the clinic according to his mother who is with him today, his blood pressure is actually in a normal range at around 110/73 which is unusual for him he normally runs extremely high in the 811B for systolic. His blood glucose was 227 here in the clinic today respiratory rate was 18 and his temperature was 97.7. Pulse was right around 70 when I checked this manually. Nonetheless although his vital signs and normal circumstances would be reassuring I am still kind of concerned just because of the way he is acting. I did question him about medications that he has taken he told me that he took 2 Tylenol and tramadol this morning he also tells me he took 2 tramadol last night. With that being said I do not believe that just taking the tramadol is accounting for what is going on as well based on what I see today. I discussed all this with his mother as well as the patient face-to-face in the office today and subsequently I think I would recommend further evaluation at the ER as I really feel like there is something going on here that I cannot identify just here in the clinic. 01/13/2020 upon evaluation  today patient presents for follow-up concerning ongoing issues with his right leg ulcer. After I last saw him 2 weeks ago he was actually sent to the ER where subsequently he ended up being placed on dialysis. He is doing better although he still seems very tired I think still in general he is doing much better. Fortunately there is no signs of active infection at this time. No fevers, chills, nausea, vomiting, or diarrhea. 01/26/2020; this is a patient with a large irregular albeit superficial wound on the right anterior lower leg. He has been using Santyl. He has recently been initiated on dialysis after we sent him to the ER earlier this month. He describes the wound is being very painful. By his description this started as a small raised area that rapidly expanded. He does not have a lot of obvious  venous issues. Looking back through the records this was described as initially traumatic in April. He has seen vascular surgery and not felt to have an arterial issue 02/03/2020 upon evaluation today patient actually appears to be doing a little better in regard to his ulcer although it has spread inferiorly the central portion of the wound and towards the upper has actually new skin growth. Fortunately there is no signs of active infection at this time which is great news. 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 With that being said some of the more significant issues here such as angiosarcoma does not appear to be likely based on pathology report. It seems that were more in the arterial or possible calciphylaxis realm based on what I am  seeing visually. 02/10/2020 on evaluation today patient actually making some progress in regard to his leg ulcer. There is a lot of new skin growth which is excellent he tells me he still having a lot of pain but in general this seems to be showing signs of dramatic improvement which is great news. Overall I think that he is continuing to tolerate the Santyl along with ABD pad and roll gauze to secure. 02/17/2020 on evaluation today patient appears to be doing well in regard to his wound. He has been tolerating the dressing changes without complication. Fortunately there is no signs of active infection at this time which is great news and overall very pleased with where he stands today. 02/24/2020 upon evaluation today patient appears to be doing very well with regard to his leg ulcer. He has been tolerating the dressing changes with the Santyl without complication. Fortunately there is no signs of active infection at this time. No fevers, chills, nausea, vomiting, or diarrhea. 03/09/2020 upon evaluation today patient appears to be doing very well in regard to his ulcer. He has been tolerating the dressing changes without complication. Fortunately there is no signs of active infection at this time. Overall I think he is making great progress there is no depth to the wound and overall the patient seems to be feeling much better from a pain perspective as well. I am very pleased on all that he is doing so well. 03/23/2020 on evaluation today patient appears to be doing well with regard to his lower extremity ulcer. This is showing signs of excellent improvement he has just a very small area on the inferior portion of the wound that is still open. He still has pain but not nearly as bad as it was. Objective Constitutional Well-nourished and well-hydrated in no acute distress. Vitals Time Taken: 8:24 AM, Weight: 256 lbs, Temperature: 98.4 F, Pulse: 74 bpm, Respiratory Rate: 18 breaths/min, Blood  Pressure: 143/94 mmHg, Capillary Blood Glucose: 130 mg/dl. Respiratory normal breathing without difficulty. Psychiatric this patient is able to make decisions and demonstrates good insight into disease process. Alert and Oriented x 3. pleasant and cooperative. General Notes: Patient's wound bed actually showed signs of good granulation at this time. There was minimal slough noted I remove this which is mechanical debridement with saline and gauze he tolerated that without complication. Integumentary (Hair, Skin) Wound #2 status is Open. Original cause of wound was Trauma. The wound is located on the Right,Anterior Lower Leg.  The wound measures 1.2cm length x 1.3cm width x 0.1cm depth; 1.225cm^2 area and 0.123cm^3 volume. There is Fat Layer (Subcutaneous Tissue) exposed. There is no tunneling or undermining noted. There is a medium amount of serosanguineous drainage noted. The wound margin is flat and intact. There is large (67-100%) pink granulation within the wound bed. There is a small (1-33%) amount of necrotic tissue within the wound bed including Adherent Slough. 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 There was a Chemical/Enzymatic/Mechanical debridement performed by [name], RN.Marland Kitchen Agent used was [employer]. A time out was conducted at 09:05, prior to the start of the procedure. There was no bleeding. The procedure was tolerated well. Post Debridement Measurements: 1.2cm length x 1.3cm width x 0.1cm depth; 0.123cm^3 volume. Character of Wound/Ulcer Post  Debridement is improved. Severity of Tissue Post Debridement is: Fat layer exposed. Post procedure Diagnosis Wound #2: Same as Pre-Procedure Plan Follow-up Appointments: Return Appointment in 1 week. Bathing/ Shower/ Hygiene: May shower and wash wound with soap and water. Edema Control - Lymphedema / SCD / Other: Elevate legs to the level of the heart or above for 30 minutes daily and/or when sitting, a frequency of: Avoid standing for long periods of time. Exercise regularly Moisturize legs daily. WOUND #2: - Lower Leg Wound Laterality: Right, Anterior Prim Dressing: Santyl Ointment 1 x Per Day/ ary Discharge Instructions: Apply nickel thick amount to wound bed as instructed Secondary Dressing: ABD Pad, 8x10 1 x Per Day/ Discharge Instructions: Apply over primary dressing as directed. Secured With: [employer], 4.5x3.1 (in/yd) 1 x Per Day/ Discharge Instructions: Secure with Kerlix as directed. 1. Would recommend currently been going continue with the wound care measures as before and the patient is in agreement with the plan. This is including the Santyl which seems to be doing an excellent job for him. 2. I would recommend as well that we continue with the ABD pad followed by roll gauze to secure in place to be changed daily. The patient is doing excellent with this currently. We will see patient back for reevaluation in 1 week here in the clinic. If anything worsens or changes patient will contact our office for additional recommendations. Electronic Signature(s) Signed: 03/23/2020 9:11:04 AM By: Worthy Keeler PA-C Entered By: Worthy Keeler on 03/23/2020 09:11:04 -------------------------------------------------------------------------------- SuperBill Details Patient Name: Date of Service: [name], [name] 03/23/2020 Medical Record Number: [id number] Patient Account Number: [id number] Date of Birth/Sex: Treating RN: [birth year] (54 y.o. [name] Primary  Care Provider: Precious Haws Other Clinician: Referring Provider: Treating Provider/Extender: [name], [name] in Treatment: 33 Diagnosis Coding ICD-10 Codes Code Description S80.811D Abrasion, right lower leg, subsequent encounter L97.812 Non-pressure chronic ulcer of other part of right lower leg with fat layer exposed E11.622 Type 2 diabetes mellitus with other skin ulcer I89.0 Lymphedema, not elsewhere classified I87.2 Venous insufficiency (chronic) (peripheral) Facility Procedures CPT4 Code: [id number] Description: 76283 - DEBRIDE W/O ANES NON SELECT Modifier: Quantity: 1 Physician Procedures : CPT4 Code Description Modifier [id number] - WC PHYS LEVEL 3 - EST PT ICD-10 Diagnosis Description S80.811D Abrasion, right lower leg, subsequent encounter W73.710  Non-pressure chronic ulcer of other part of right lower leg with fat layer exposed  E11.622 Type 2 diabetes mellitus with other skin ulcer I89.0 Lymphedema, not elsewhere classified Quantity: 1 Electronic Signature(s) Signed: 03/23/2020 9:11:28 AM By: Worthy Keeler PA-C Entered By: Worthy Keeler on 03/23/2020 09:11:27

## 2020-03-29 ENCOUNTER — Encounter (HOSPITAL_BASED_OUTPATIENT_CLINIC_OR_DEPARTMENT_OTHER): Payer: Medicare Other | Admitting: Internal Medicine

## 2020-03-29 ENCOUNTER — Other Ambulatory Visit: Payer: Self-pay

## 2020-03-29 DIAGNOSIS — E11622 Type 2 diabetes mellitus with other skin ulcer: Secondary | ICD-10-CM | POA: Diagnosis not present

## 2020-03-30 NOTE — Progress Notes (Signed)
[name], [name] ([id number]) Visit Report for 03/29/2020 Arrival Information Details Patient Name: Date of Service: [name], [name] 03/29/2020 7:30 A M Medical Record Number: [id number] Patient Account Number: [id number] Date of Birth/Sex: Treating RN: 04/29/1965 (54 y.o. [name]) [name] Primary Care Jowell Bossi: Precious Haws Other Clinician: Referring Che Rachal: Treating Kaleena Corrow/Extender: [name] in Treatment: 62 Visit Information History Since Last Visit All ordered tests and consults were completed: No Patient Arrived: Ambulatory Added or deleted any medications: No Arrival Time: 07:55 Any new allergies or adverse reactions: No Accompanied By: self Had a fall or experienced change in No Transfer Assistance: None activities of daily living that may affect Patient Identification Verified: Yes risk of falls: Secondary Verification Process Completed: Yes Signs or symptoms of abuse/neglect since last visito No Patient Requires Transmission-Based Precautions: No Hospitalized since last visit: No Patient Has Alerts: Yes Implantable device outside of the clinic excluding No Patient Alerts: R ABI =1.19, TBI = .93 cellular tissue based products placed in the center L ABI = 1.29, TBI = .86 since last visit: Has Dressing in Place as Prescribed: Yes Pain Present Now: Yes Electronic Signature(s) Signed: 03/30/2020 5:14:06 PM By: [name] RN Entered By: [name] on 03/29/2020 07:56:27 -------------------------------------------------------------------------------- Clinic Level of Care Assessment Details Patient Name: Date of Service: [name], [name] 03/29/2020 7:30 A M Medical Record Number: [id number] Patient Account Number: [id number] Date of Birth/Sex: Treating RN: [birth year] (54 y.o. [name], [name] Primary Care Chryl Holten: Precious Haws Other Clinician: Referring Signe Tackitt: Treating Terris Bodin/Extender: [name] in Treatment:  34 Clinic Level of Care Assessment Items TOOL 4 Quantity Score X- 1 0 Use when only an EandM is performed on FOLLOW-UP visit ASSESSMENTS - Nursing Assessment / Reassessment X- 1 10 Reassessment of Co-morbidities (includes updates in patient status) X- 1 5 Reassessment of Adherence to Treatment Plan ASSESSMENTS - Wound and Skin A ssessment / Reassessment X - Simple Wound Assessment / Reassessment - one wound 1 5 _0  - 0 Complex Wound Assessment / Reassessment - multiple wounds X- 1 10 Dermatologic / Skin Assessment (not related to wound area) ASSESSMENTS - Focused Assessment X- 1 5 Circumferential Edema Measurements - multi extremities X- 1 10 Nutritional Assessment / Counseling / Intervention X- 1 5 Lower Extremity Assessment (monofilament, tuning fork, pulses) _1  - 0 Peripheral Arterial Disease Assessment (using hand held doppler) ASSESSMENTS - Ostomy and/or Continence Assessment and Care _2  - 0 Incontinence Assessment and Management _3  - 0 Ostomy Care Assessment and Management (repouching, etc.) PROCESS - Coordination of Care X - Simple Patient / Family Education for ongoing care 1 15 _4  - 0 Complex (extensive) Patient / Family Education for ongoing care X- 1 10 Staff obtains Programmer, systems, Records, T Results / Process Orders est _5  - 0 Staff telephones HHA, Nursing Homes / Clarify orders / etc _6  - 0 Routine Transfer to another Facility (non-emergent condition) _7  - 0 Routine Hospital Admission (non-emergent condition) _8  - 0 New Admissions / Biomedical engineer / Ordering NPWT Apligraf, etc. , _9  - 0 Emergency Hospital Admission (emergent condition) X- 1 10 Simple Discharge Coordination _10  - 0 Complex (extensive) Discharge Coordination PROCESS - Special Needs _11  - 0 Pediatric / Minor Patient Management _12  - 0 Isolation Patient Management _13  - 0 Hearing / Language / Visual special needs _14  - 0 Assessment of Community assistance (transportation, D/C  planning, etc.) _15  - 0 Additional assistance / Altered mentation _16  - 0 Support Surface(s) Assessment (bed, cushion, seat, etc.) INTERVENTIONS -  Wound Cleansing / Measurement X - Simple Wound Cleansing - one wound 1 5 _0  - 0 Complex Wound Cleansing - multiple wounds X- 1 5 Wound Imaging (photographs - any number of wounds) _1  - 0 Wound Tracing (instead of photographs) X- 1 5 Simple Wound Measurement - one wound _2  - 0 Complex Wound Measurement - multiple wounds INTERVENTIONS - Wound Dressings X - Small Wound Dressing one or multiple wounds 1 10 _3  - 0 Medium Wound Dressing one or multiple wounds _4  - 0 Large Wound Dressing one or multiple wounds <YQMVHQIONGEXBMWU>_1<\/LKGMWNUUVOZDGUYQ>_0  - 0 Application of Medications - topical <HKVQQVZDGLOVFIEP>_3<\/IRJJOACZYSAYTKZS>_0  - 0 Application of Medications - injection INTERVENTIONS - Miscellaneous _7  - 0 External ear exam _8  - 0 Specimen Collection (cultures, biopsies, blood, body fluids, etc.) _9  - 0 Specimen(s) / Culture(s) sent or taken to Lab for analysis _10  - 0 Patient Transfer (multiple staff / Civil Service fast streamer / Similar devices) _11  - 0 Simple Staple / Suture removal (25 or less) _12  - 0 Complex Staple / Suture removal (26 or more) _13  - 0 Hypo / Hyperglycemic Management (close monitor of Blood Glucose) _14  - 0 Ankle / Brachial Index (ABI) - do not check if billed separately X- 1 5 Vital Signs Has the patient been seen at the hospital within the last three years: Yes Total Score: 115 Level Of Care: New/Established - Level 3 Electronic Signature(s) Signed: 03/30/2020 5:16:00 PM By: Rhae Hammock RN Entered By: Rhae Hammock on 03/29/2020 08:20:16 -------------------------------------------------------------------------------- Encounter Discharge Information Details Patient Name: Date of Service: [name], [name] 03/29/2020 7:30 A M Medical Record Number: [id number] Patient Account Number: [id number] Date of Birth/Sex: Treating RN: 1966-02-16 (54 y.o. [name] Primary Care Vivyan Biggers:  Precious Haws Other Clinician: Referring Mariaha Ellington: Treating Toriann Spadoni/Extender: [name] in Treatment: 34 Encounter Discharge Information Items Discharge Condition: Stable Ambulatory Status: Cane Discharge Destination: Home Transportation: Private Auto Accompanied By: self Schedule Follow-up Appointment: Yes Clinical Summary of Care: Electronic Signature(s) Signed: 03/29/2020 5:55:39 PM By: Deon Pilling Entered By: Deon Pilling on 03/29/2020 08:55:29 -------------------------------------------------------------------------------- Lower Extremity Assessment Details Patient Name: Date of Service: [name], [name] 03/29/2020 7:30 A M Medical Record Number: [id number] Patient Account Number: [id number] Date of Birth/Sex: Treating RN: 25-Sep-1965 (54 y.o. [name]) [name] Primary Care Chaslyn Eisen: Precious Haws Other Clinician: Referring Xzaiver Vayda: Treating Cian Costanzo/Extender: Norina Buzzard, [name] in Treatment: 34 Edema Assessment Assessed: Shirlyn Goltz: No] [Right: No] Edema: [Left: N] [Right: o] Calf Left: Right: Point of Measurement: 31 cm From Medial Instep 42 cm Ankle Left: Right: Point of Measurement: 8 cm From Medial Instep 24 cm Electronic Signature(s) Signed: 03/30/2020 5:14:06 PM By: [name] RN Entered By: [name] on 03/29/2020 07:59:41 -------------------------------------------------------------------------------- Multi Wound Chart Details Patient Name: Date of Service: [name], [name] 03/29/2020 7:30 A M Medical Record Number: [id number] Patient Account Number: [id number] Date of Birth/Sex: Treating RN: 26-Nov-1965 (54 y.o. [name], [name] Primary Care Shaden Lacher: Precious Haws Other Clinician: Referring Sharene Krikorian: Treating Deshana Rominger/Extender: Norina Buzzard, Lemar Lofty in Treatment: 34 Vital Signs Height(in): Pulse(bpm): 80 Weight(lbs): 256 Blood Pressure(mmHg): 123/85 Body Mass Index(BMI): Temperature(F):  98.5 Respiratory Rate(breaths/min): 18 Photos: [2:No Photos Right, Anterior Lower Leg] [N/A:N/A N/A] Wound Location: [2:Trauma] [N/A:N/A] Wounding Event: [2:Venous Leg Ulcer] [N/A:N/A] Primary Etiology: [2:Lymphedema, Sleep Apnea,] [N/A:N/A] Comorbid History: [2:Arrhythmia, Congestive Heart Failure, Hypertension, Peripheral Arterial Disease, Type II Diabetes 11/30/2019] [N/A:N/A] Date Acquired: [2:16] [N/A:N/A] Weeks of Treatment: [2:Open] [N/A:N/A] Wound Status: [2:1x0.8x0.1] [N/A:N/A] Measurements L x W x D (cm) [2:0.628] [N/A:N/A]  A (cm) : rea [2:0.063] [N/A:N/A] Volume (cm) : [2:88.10%] [N/A:N/A] % Reduction in Area: [2:88.10%] [N/A:N/A] % Reduction in Volume: [2:Full Thickness Without Exposed] [N/A:N/A] Classification: [2:Support Structures Medium] [N/A:N/A] Exudate Amount: [2:Serosanguineous] [N/A:N/A] Exudate Type: [2:red, brown] [N/A:N/A] Exudate Color: [2:Flat and Intact] [N/A:N/A] Wound Margin: [2:Large (67-100%)] [N/A:N/A] Granulation Amount: [2:Pink] [N/A:N/A] Granulation Quality: [2:Small (1-33%)] [N/A:N/A] Necrotic Amount: [2:Fat Layer (Subcutaneous Tissue): Yes N/A] Exposed Structures: [2:Fascia: No Tendon: No Muscle: No Joint: No Bone: No Large (67-100%)] [N/A:N/A] Treatment Notes Electronic Signature(s) Signed: 03/29/2020 5:57:42 PM By: Linton Ham MD Signed: 03/30/2020 5:16:00 PM By: Rhae Hammock RN Entered By: Linton Ham on 03/29/2020 08:20:58 -------------------------------------------------------------------------------- Multi-Disciplinary Care Plan Details Patient Name: Date of Service: [name], [name] 03/29/2020 7:30 A M Medical Record Number: [id number] Patient Account Number: [id number] Date of Birth/Sex: Treating RN: 02-23-1966 (54 y.o. [name], [name] Primary Care Annora Guderian: Precious Haws Other Clinician: Referring Jaimya Feliciano: Treating Tejal Monroy/Extender: [name] in Treatment: 34 Active Inactive Venous  Leg Ulcer Nursing Diagnoses: Knowledge deficit related to disease process and management Potential for venous Insuffiency (use before diagnosis confirmed) Goals: Patient will maintain optimal edema control Date Initiated: 09/09/2019 Target Resolution Date: 05/05/2020 Goal Status: Active Patient/caregiver will verbalize understanding of disease process and disease management Date Initiated: 09/09/2019 Date Inactivated: 10/14/2019 Target Resolution Date: 10/07/2019 Goal Status: Met Interventions: Assess peripheral edema status every visit. Compression as ordered Provide education on venous insufficiency Treatment Activities: Therapeutic compression applied : 09/09/2019 Notes: Wound/Skin Impairment Nursing Diagnoses: Knowledge deficit related to ulceration/compromised skin integrity Goals: Patient/caregiver will verbalize understanding of skin care regimen Date Initiated: 08/04/2019 Target Resolution Date: 04/20/2020 Goal Status: Active Ulcer/skin breakdown will have a volume reduction of 30% by week 4 Date Initiated: 08/04/2019 Date Inactivated: 09/09/2019 Target Resolution Date: 09/11/2019 Goal Status: Met Ulcer/skin breakdown will have a volume reduction of 50% by week 8 Date Initiated: 09/09/2019 Date Inactivated: 10/14/2019 Target Resolution Date: 10/07/2019 Goal Status: Unmet Unmet Reason: awaiting reflux studies Ulcer/skin breakdown will have a volume reduction of 80% by week 12 Date Initiated: 10/14/2019 Date Inactivated: 11/18/2019 Target Resolution Date: 11/04/2019 Goal Status: Unmet Unmet Reason: uncontrolled htn Interventions: Assess patient/caregiver ability to obtain necessary supplies Assess patient/caregiver ability to perform ulcer/skin care regimen upon admission and as needed Assess ulceration(s) every visit Notes: Electronic Signature(s) Signed: 03/30/2020 5:16:00 PM By: Rhae Hammock RN Entered By: Rhae Hammock on 03/29/2020  07:51:06 -------------------------------------------------------------------------------- Pain Assessment Details Patient Name: Date of Service: [name], [name] 03/29/2020 7:30 A M Medical Record Number: [id number] Patient Account Number: [id number] Date of Birth/Sex: Treating RN: Apr 30, 1965 (54 y.o. [name] Primary Care Ruben Mahler: Precious Haws Other Clinician: Referring Valborg Friar: Treating Donshay Lupinski/Extender: Hosie Poisson Weeks in Treatment: 34 Active Problems Location of Pain Severity and Description of Pain Patient Has Paino Yes Site Locations With Dressing Change: Yes Duration of the Pain. Constant / Intermittento Intermittent How Long Does it Lasto Hours: Minutes: 15 Rate the pain. Current Pain Level: 3 Worst Pain Level: 7 Least Pain Level: 0 Tolerable Pain Level: 5 Character of Pain Describe the Pain: Burning Pain Management and Medication Current Pain Management: Medication: Yes Cold Application: No Rest: Yes Massage: No Activity: No T.E.N.S.: No Heat Application: No Leg drop or elevation: No Is the Current Pain Management Adequate: Inadequate How does your wound impact your activities of daily livingo Sleep: No Bathing: No Appetite: No Relationship With Others: No Bladder Continence: No Emotions: No Bowel Continence: No Work: No Toileting: No Drive: No Dressing: No Hobbies:  No Electronic Signature(s) Signed: 03/30/2020 5:14:06 PM By: [name] RN Entered By: [name] on 03/29/2020 07:58:12 -------------------------------------------------------------------------------- Patient/Caregiver Education Details Patient Name: Date of Service: [name], [name] 12/21/2021andnbsp7:30 A M Medical Record Number: [id number] Patient Account Number: [id number] Date of Birth/Gender: Treating RN: Jan 01, 1966 (54 y.o. [name], Oracle Primary Care Physician: Precious Haws Other Clinician: Referring Physician: Treating  Physician/Extender: [name] in Treatment: 32 Education Assessment Education Provided To: Patient Education Topics Provided Venous: Methods: Explain/Verbal Responses: State content correctly Electronic Signature(s) Signed: 03/30/2020 5:16:00 PM By: Rhae Hammock RN Entered By: Rhae Hammock on 03/29/2020 07:51:22 -------------------------------------------------------------------------------- Wound Assessment Details Patient Name: Date of Service: [name], [name] 03/29/2020 7:30 A M Medical Record Number: [id number] Patient Account Number: [id number] Date of Birth/Sex: Treating RN: [birth year] (54 y.o. [name]) [name] Primary Care Colisha Redler: Precious Haws Other Clinician: Referring Delrick Dehart: Treating Jayke Caul/Extender: Norina Buzzard, Lynelle Smoke Weeks in Treatment: 34 Wound Status Wound Number: 2 Primary Venous Leg Ulcer Etiology: Wound Location: Right, Anterior Lower Leg Wound Open Wounding Event: Trauma Status: Date Acquired: 11/30/2019 Comorbid Lymphedema, Sleep Apnea, Arrhythmia, Congestive Heart Failure, Weeks Of Treatment: 16 History: Hypertension, Peripheral Arterial Disease, Type II Diabetes Clustered Wound: No Wound Measurements Length: (cm) 1 Width: (cm) 0.8 Depth: (cm) 0.1 Area: (cm) 0.628 Volume: (cm) 0.063 % Reduction in Area: 88.1% % Reduction in Volume: 88.1% Epithelialization: Large (67-100%) Tunneling: No Undermining: No Wound Description Classification: Full Thickness Without Exposed Support Structures Wound Margin: Flat and Intact Exudate Amount: Medium Exudate Type: Serosanguineous Exudate Color: red, brown Foul Odor After Cleansing: No Slough/Fibrino Yes Wound Bed Granulation Amount: Large (67-100%) Exposed Structure Granulation Quality: Pink Fascia Exposed: No Necrotic Amount: Small (1-33%) Fat Layer (Subcutaneous Tissue) Exposed: Yes Necrotic Quality: Adherent Slough Tendon Exposed: No Muscle  Exposed: No Joint Exposed: No Bone Exposed: No Treatment Notes Wound #2 (Lower Leg) Wound Laterality: Right, Anterior Cleanser Wound Cleanser Discharge Instruction: Cleanse the wound with wound cleanser prior to applying a clean dressing using gauze sponges, not tissue or cotton balls. Peri-Wound Care Topical Primary Dressing KerraCel Ag Gelling Fiber Dressing, 2x2 in (silver alginate) Discharge Instruction: Apply silver alginate to wound bed as instructed Secondary Dressing Woven Gauze Sponge, Non-Sterile 4x4 in Discharge Instruction: Apply over primary dressing as directed. ABD Pad, 5x9 Discharge Instruction: Apply over primary dressing as directed. Secured With Elastic Bandage 4 inch (ACE bandage) Discharge Instruction: Secure with ACE bandage as directed. Kerlix Roll Sterile, 4.5x3.1 (in/yd) Discharge Instruction: Secure with Kerlix as directed. Wrap from bottom of ankle to bend of knee (not too loose). Paper Tape, 2x10 (in/yd) Discharge Instruction: Secure dressing with tape as directed. Compression Wrap Compression Stockings Add-Ons Electronic Signature(s) Signed: 03/30/2020 5:14:06 PM By: [name] RN Entered By: [name] on 03/29/2020 08:00:06 -------------------------------------------------------------------------------- Vitals Details Patient Name: Date of Service: [name], [name] 03/29/2020 7:30 A M Medical Record Number: [id number] Patient Account Number: [id number] Date of Birth/Sex: Treating RN: Jun 14, 1965 (54 y.o. [name]) [name] Primary Care Arrington Yohe: Precious Haws Other Clinician: Referring Regnald Bowens: Treating Teela Narducci/Extender: Norina Buzzard, Lemar Lofty in Treatment: 34 Vital Signs Time Taken: 07:56 Temperature (F): 98.5 Weight (lbs): 256 Pulse (bpm): 80 Respiratory Rate (breaths/min): 18 Blood Pressure (mmHg): 123/85 Reference Range: 80 - 120 mg / dl Electronic Signature(s) Signed: 03/30/2020 5:14:06 PM By: [name]  RN Entered By: [name] on 03/29/2020 07:56:50

## 2020-03-30 NOTE — Progress Notes (Signed)
Keach, MATISSE ([id number]) Visit Report for 03/29/2020 HPI Details Patient Name: Date of Service: [name], [name] 03/29/2020 7:30 A M Medical Record Number: [id number] Patient Account Number: [id number] Date of Birth/Sex: Treating RN: 08-18-1965 (54 y.o. [name], [name] Primary Care Provider: Precious Haws Other Clinician: Referring Provider: Treating Provider/Extender: [name] in Treatment: 34 History of Present Illness HPI Description: ADMISSION 08/04/2019 This is a 54 year old man who traumatized his right lateral lower leg while going up an escalator at the mall sometime in December 2020. He was referred to vascular surgery and on 04/14/2019 he saw Dr. Wenda Overland. He was felt to have "palpable pulses and normal noninvasive studies" although I cannot see these results. He was back in his primary doctor's office on 07/30/2019 for a nonhealing right lower extremity wound. He was referred to wound care I think in [facility] but his insurance was out of network. A CNS was ordered. X-ray was done that was negative. He has been using wound cleanser and a Band-Aid. Past medical history is actually quite extensive and includes chronic kidney disease stage IV, chronic diastolic heart failure, atrial fibrillation, poorly controlled hypertension, poorly controlled diabetes with peripheral neuropathy and a recent hemoglobin A1c of 11.5, depression, lymphedema and sleep apnea ABI in our clinic was 1.1 in the right 5/6; this wound was initially traumatized while the patient was going down on an escalator. Arrived in clinic last week with a completely nonviable surface. He required mechanical debridement and we have been using Iodoflex under compression. Apparently the compression wraps fell down. He also has poorly controlled diabetes 5/15; traumatic/contusion wound to the right lateral lower leg. We have been using Iodoflex to clean at the surface of this which was completely  nonviable along with mechanical debridements. He is under compression. 09/02/2019 upon evaluation today patient appears to be doing well with regard to his wound on his right lateral leg. He has been tolerating the dressing changes without complication. Fortunately there is no signs of active infection at this time. No fevers, chills, nausea, vomiting, or diarrhea. With that being said there is some necrotic tissue around the edges of the wound that is and require some sharp debridement today. That was discussed with the patient and his mother in the office today prior to debridement to which she did consent. 09/09/2019 upon evaluation today patient appears to be making progress in regard to his wound. Fortunately the wound is not nearly as deep as what it was. It is measuring a little bit larger due to having to clear some of the edges away but again that is necessary to get this to heal. Fortunately there is no evidence of active infection at this time. No fevers, chills, nausea, vomiting, or diarrhea. 09/16/2019 on evaluation today patient appears to be doing excellent in regard to his wound currently. In fact I do not see anything that we can need to debride today which is great news. The wound bed looks healthy and the edges of the wound look like they are doing great. Overall I feel like we are at a good place and headed in the proper direction. 09/23/2019 upon evaluation today patient appears to be doing okay in regard to his wound. This is not measuring any smaller it is about the same. With that being said he tells me that his wrap actually slid down he ended up having to take this off he was on a trip and did not have his Velcro compression with  him. With that being said I do not see any signs of anything significantly worsening which is good news. 09/30/2019 upon evaluation today patient appears to be doing a little bit better in regard to his leg ulcer. He has been tolerating the dressing changes  without complication. Fortunately there is no signs of active infection at this time. No fevers, chills, nausea, vomiting, or diarrhea. 10/14/2019 upon evaluation today patient's wound appears to be healthier. Overall but unfortunately is measuring a little bit bigger. With that being said I do think that we do need to still continue to wrap his leg. Fortunately there is no signs of active infection at this time which is great news. 11/18/2019 upon evaluation today patient actually appears to be doing decently well with regard to his wound. He has been tolerating the dressing changes without complication. Fortunately there is no signs of active infection has been using just Neosporin at this point since he was in the hospital. He did have a venous Doppler study in the hospital though I cannot see the results that was from November 04, 2019. Nonetheless overall I am pleased with the fact the patient does seem to be doing better. 11/25/2019 on evaluation today patient appears to be doing well with regard to his wounds. He has been tolerating the dressing changes without complication. Fortunately there is no signs of active infection at this time. No fevers, chills, nausea, vomiting, or diarrhea. I do believe that he does not appear to be showing any signs of worsening although he also seems to be having some trouble getting this to proceed in a good fashion towards healing. 12/02/2019 on evaluation today patient actually appears to be doing not quite as well in regard to his wounds. Both have some slough noted on the surface of the wound. Unfortunately he is continuing to have some issues here with pain as well in the anterior portion which I think is an issue. Fortunately there is no signs of active infection at this time which is great news. No fevers, chills, nausea, vomiting, or diarrhea. 12/30/2019 upon evaluation today patient unfortunately has not been seen in the past month we are just now seeing him back  for reevaluation today last time I saw him was August 25. At that point he had a lateral wound on his leg and an anterior wound which which is starting. The lateral has healed unfortunately the anterior looks worse. With that being said the patient unfortunately seems to be having some issues today that I cannot completely explain by just the way the wound appears. The wound itself is not really hot to touch and to be honest I would not necessarily assume infection upon initial inspection. With that being said he is acting very lethargic, was weak walking into the clinic according to his mother who is with him today, his blood pressure is actually in a normal range at around 110/73 which is unusual for him he normally runs extremely high in the 528U for systolic. His blood glucose was 227 here in the clinic today respiratory rate was 18 and his temperature was 97.7. Pulse was right around 70 when I checked this manually. Nonetheless although his vital signs and normal circumstances would be reassuring I am still kind of concerned just because of the way he is acting. I did question him about medications that he has taken he told me that he took 2 Tylenol and tramadol this morning he also tells me he took 2 tramadol last  night. With that being said I do not believe that just taking the tramadol is accounting for what is going on as well based on what I see today. I discussed all this with his mother as well as the patient face-to-face in the office today and subsequently I think I would recommend further evaluation at the ER as I really feel like there is something going on here that I cannot identify just here in the clinic. 01/13/2020 upon evaluation today patient presents for follow-up concerning ongoing issues with his right leg ulcer. After I last saw him 2 weeks ago he was actually sent to the ER where subsequently he ended up being placed on dialysis. He is doing better although he still seems very  tired I think still in general he is doing much better. Fortunately there is no signs of active infection at this time. No fevers, chills, nausea, vomiting, or diarrhea. 01/26/2020; this is a patient with a large irregular albeit superficial wound on the right anterior lower leg. He has been using Santyl. He has recently been initiated on dialysis after we sent him to the ER earlier this month. He describes the wound is being very painful. By his description this started as a small raised area that rapidly expanded. He does not have a lot of obvious venous issues. Looking back through the records this was described as initially traumatic in April. He has seen vascular surgery and not felt to have an arterial issue 02/03/2020 upon evaluation today patient actually appears to be doing a little better in regard to his ulcer although it has spread inferiorly the central portion of the wound and towards the upper has actually new skin growth. Fortunately there is no signs of active infection at this time which is great news. 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 With that being said some of the more significant issues here such as angiosarcoma does not appear to be likely based on pathology report. It seems that were more in the arterial or possible calciphylaxis realm based on what I am seeing visually. 02/10/2020 on evaluation today patient actually making some progress in regard to his leg ulcer. There is a lot of new skin growth which is excellent he tells me he still having a lot of pain but in general this seems to be showing signs of dramatic improvement  which is great news. Overall I think that he is continuing to tolerate the Santyl along with ABD pad and roll gauze to secure. 02/17/2020 on evaluation today patient appears to be doing well in regard to his wound. He has been tolerating the dressing changes without complication. Fortunately there is no signs of active infection at this time which is great news and overall very pleased with where he stands today. 02/24/2020 upon evaluation today patient appears to be doing very well with regard to his leg ulcer. He has been tolerating the dressing changes with the Santyl without complication. Fortunately there is no signs of active infection at this time. No fevers, chills, nausea, vomiting, or diarrhea. 03/09/2020 upon evaluation today patient appears to be doing very well in regard to his ulcer. He has been tolerating the dressing changes without complication. Fortunately there is no signs  of active infection at this time. Overall I think he is making great progress there is no depth to the wound and overall the patient seems to be feeling much better from a pain perspective as well. I am very pleased on all that he is doing so well. 03/23/2020 on evaluation today patient appears to be doing well with regard to his lower extremity ulcer. This is showing signs of excellent improvement he has just a very small area on the inferior portion of the wound that is still open. He still has pain but not nearly as bad as it was. 03/29/2020; this patient I do not usually see. He has a history of an extensive wound on the right anterior tibia area he has been using Santyl and is on Ace wrap at home with an ABD over the area. He changes the dressing himself. He still has 1 small area left at the most distal part of this wound almost all the rest of it is healed with scar tissue. In looking at the overall wound area he did have a few blisters which often means excessive fluid Electronic Signature(s) Signed:  03/29/2020 5:57:42 PM By: Linton Ham MD Entered By: Linton Ham on 03/29/2020 08:23:14 -------------------------------------------------------------------------------- Physical Exam Details Patient Name: Date of Service: [name], [name] 03/29/2020 7:30 A M Medical Record Number: [id number] Patient Account Number: [id number] Date of Birth/Sex: Treating RN: 1965-06-01 (54 y.o. Erie Noe Primary Care Provider: Precious Haws Other Clinician: Referring Provider: Treating Provider/Extender: Norina Buzzard, Lemar Lofty in Treatment: 34 Constitutional Sitting or standing Blood Pressure is within target range for patient.. Pulse regular and within target range for patient.Marland Kitchen Respirations regular, non-labored and within target range.. Temperature is normal and within the target range for the patient.Marland Kitchen Appears in no distress. Cardiovascular Pedal pulses palpable. Not a lot of swelling in this leg.. Notes Wound exam; only 1 small open area in the most distal part of this wound. He does have blisters in the superior medial part of this wound x3 Electronic Signature(s) Signed: 03/29/2020 5:57:42 PM By: Linton Ham MD Entered By: Linton Ham on 03/29/2020 08:24:44 -------------------------------------------------------------------------------- Physician Orders Details Patient Name: Date of Service: [name], [name] 03/29/2020 7:30 A M Medical Record Number: [id number] Patient Account Number: [id number] Date of Birth/Sex: Treating RN: Oct 07, 1965 (54 y.o. Erie Noe Primary Care Provider: Precious Haws Other Clinician: Referring Provider: Treating Provider/Extender: [name] in Treatment: 20 Verbal / Phone Orders: No Diagnosis Coding Follow-up Appointments Return Appointment in 2 weeks. Bathing/ Shower/ Hygiene May shower and wash wound with soap and water. Edema Control - Lymphedema / SCD / Other Bilateral Lower  Extremities Elevate legs to the level of the heart or above for 30 minutes daily and/or when sitting, a frequency of: Avoid standing for long periods of time. Exercise regularly Moisturize legs daily. Wound Treatment Wound #2 - Lower Leg Wound Laterality: Right, Anterior Cleanser: Wound Cleanser Every Other Day/30 Days Discharge Instructions: Cleanse the wound with wound cleanser prior to applying a clean dressing using gauze sponges, not tissue or cotton balls. Prim Dressing: KerraCel Ag Gelling Fiber Dressing, 2x2 in (silver alginate) (DME) (Generic) Every Other Day/30 Days ary Discharge Instructions: Apply silver alginate to wound bed as instructed Secondary Dressing: Woven Gauze Sponge, Non-Sterile 4x4 in (DME) (Generic) Every Other Day/30 Days Discharge Instructions: Apply over primary dressing as directed. Secondary Dressing: ABD Pad, 5x9 (DME) Every Other Day/30 Days Discharge Instructions: Apply over primary dressing as directed. Secured With:  Elastic Bandage 4 inch (ACE bandage) (DME) (Generic) Every Other Day/30 Days Discharge Instructions: Secure with ACE bandage as directed. Secured With: [employer], 4.5x3.1 (in/yd) (DME) (Generic) Every Other Day/30 Days Discharge Instructions: Secure with Kerlix as directed. Wrap from bottom of ankle to bend of knee (not too loose). Secured With: Paper Tape, 2x10 (in/yd) (DME) (Generic) Every Other Day/30 Days Discharge Instructions: Secure dressing with tape as directed. Electronic Signature(s) Signed: 03/29/2020 5:57:42 PM By: Linton Ham MD Signed: 03/30/2020 5:16:00 PM By: Rhae Hammock RN Entered By: Rhae Hammock on 03/29/2020 08:23:16 -------------------------------------------------------------------------------- Problem List Details Patient Name: Date of Service: [name], [name] 03/29/2020 7:30 A M Medical Record Number: [id number] Patient Account Number: [id number] Date of Birth/Sex: Treating RN: 1966/02/11  (54 y.o. 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 mellitus with other skin ulcer 08/04/2019 No Yes I89.0 Lymphedema, not elsewhere classified 08/04/2019 No Yes I87.2 Venous insufficiency (chronic) (peripheral) 09/30/2019 No Yes Inactive Problems Resolved Problems Electronic Signature(s) Signed: 03/29/2020 5:57:42 PM By: Linton Ham MD Entered By: Linton Ham on 03/29/2020 08:20:42 -------------------------------------------------------------------------------- Progress Note Details Patient Name: Date of Service: [name], [name] 03/29/2020 7:30 A M Medical Record Number: [id number] Patient Account Number: [id number] Date of Birth/Sex: Treating RN: 1965-10-10 (54 y.o. [name], [name] Primary Care Provider: Precious Haws Other Clinician: Referring Provider: Treating Provider/Extender: Norina Buzzard, Lemar Lofty in Treatment: 34 Subjective History of Present Illness (HPI) ADMISSION 08/04/2019 This is a 54 year old man who traumatized his right lateral lower leg while going [place] an escalator at the mall sometime in December 2020. He was referred to vascular surgery and on 04/14/2019 he saw Dr. Wenda Overland. He was felt to have "palpable pulses and normal noninvasive studies" although I cannot see these results. He was back in his primary doctor's office on 07/30/2019 for a nonhealing right lower extremity wound. He was referred to wound care I think in [facility] but his insurance was out of network. A CNS was ordered. X-ray was done that was negative. He has been using  wound cleanser and a Band-Aid. Past medical history is actually quite extensive and includes chronic kidney disease stage IV, chronic diastolic heart failure, atrial fibrillation, poorly controlled hypertension, poorly controlled diabetes with peripheral neuropathy and a recent hemoglobin A1c of 11.5, depression, lymphedema and sleep apnea ABI in our clinic was 1.1 in the right 5/6; this wound was initially traumatized while the patient was going down on an escalator. Arrived in clinic last week with a completely nonviable surface. He required mechanical debridement and we have been using Iodoflex under compression. Apparently the compression wraps fell down. He also has poorly controlled diabetes 5/15; traumatic/contusion wound to the right lateral lower leg. We have been using Iodoflex to clean at the surface of this which was completely nonviable along with mechanical debridements. He is under compression. 09/02/2019 upon evaluation today patient appears to be doing well with regard to his wound on his right lateral leg. He has been tolerating the dressing changes without complication. Fortunately there is no signs of active infection at this time. No fevers, chills, nausea, vomiting, or diarrhea. With that being said there is some necrotic  tissue around the edges of the wound that is and require some sharp debridement today. That was discussed with the patient and his mother in the office today prior to debridement to which she did consent. 09/09/2019 upon evaluation today patient appears to be making progress in regard to his wound. Fortunately the wound is not nearly as deep as what it was. It is measuring a little bit larger due to having to clear some of the edges away but again that is necessary to get this to heal. Fortunately there is no evidence of active infection at this time. No fevers, chills, nausea, vomiting, or diarrhea. 09/16/2019 on evaluation today patient appears to be doing  excellent in regard to his wound currently. In fact I do not see anything that we can need to debride today which is great news. The wound bed looks healthy and the edges of the wound look like they are doing great. Overall I feel like we are at a good place and headed in the proper direction. 09/23/2019 upon evaluation today patient appears to be doing okay in regard to his wound. This is not measuring any smaller it is about the same. With that being said he tells me that his wrap actually slid down he ended up having to take this off he was on a trip and did not have his Velcro compression with him. With that being said I do not see any signs of anything significantly worsening which is good news. 09/30/2019 upon evaluation today patient appears to be doing a little bit better in regard to his leg ulcer. He has been tolerating the dressing changes without complication. Fortunately there is no signs of active infection at this time. No fevers, chills, nausea, vomiting, or diarrhea. 10/14/2019 upon evaluation today patient's wound appears to be healthier. Overall but unfortunately is measuring a little bit bigger. With that being said I do think that we do need to still continue to wrap his leg. Fortunately there is no signs of active infection at this time which is great news. 11/18/2019 upon evaluation today patient actually appears to be doing decently well with regard to his wound. He has been tolerating the dressing changes without complication. Fortunately there is no signs of active infection has been using just Neosporin at this point since he was in the hospital. He did have a venous Doppler study in the hospital though I cannot see the results that was from November 04, 2019. Nonetheless overall I am pleased with the fact the patient does seem to be doing better. 11/25/2019 on evaluation today patient appears to be doing well with regard to his wounds. He has been tolerating the dressing changes  without complication. Fortunately there is no signs of active infection at this time. No fevers, chills, nausea, vomiting, or diarrhea. I do believe that he does not appear to be showing any signs of worsening although he also seems to be having some trouble getting this to proceed in a good fashion towards healing. 12/02/2019 on evaluation today patient actually appears to be doing not quite as well in regard to his wounds. Both have some slough noted on the surface of the wound. Unfortunately he is continuing to have some issues here with pain as well in the anterior portion which I think is an issue. Fortunately there is no signs of active infection at this time which is great news. No fevers, chills, nausea, vomiting, or diarrhea. 12/30/2019 upon evaluation today patient unfortunately has not been  seen in the past month we are just now seeing him back for reevaluation today last time I saw him was August 25. At that point he had a lateral wound on his leg and an anterior wound which which is starting. The lateral has healed unfortunately the anterior looks worse. With that being said the patient unfortunately seems to be having some issues today that I cannot completely explain by just the way the wound appears. The wound itself is not really hot to touch and to be honest I would not necessarily assume infection upon initial inspection. With that being said he is acting very lethargic, was weak walking into the clinic according to his mother who is with him today, his blood pressure is actually in a normal range at around 110/73 which is unusual for him he normally runs extremely high in the 016W for systolic. His blood glucose was 227 here in the clinic today respiratory rate was 18 and his temperature was 97.7. Pulse was right around 70 when I checked this manually. Nonetheless although his vital signs and normal circumstances would be reassuring I am still kind of concerned just because of the way  he is acting. I did question him about medications that he has taken he told me that he took 2 Tylenol and tramadol this morning he also tells me he took 2 tramadol last night. With that being said I do not believe that just taking the tramadol is accounting for what is going on as well based on what I see today. I discussed all this with his mother as well as the patient face-to-face in the office today and subsequently I think I would recommend further evaluation at the ER as I really feel like there is something going on here that I cannot identify just here in the clinic. 01/13/2020 upon evaluation today patient presents for follow-up concerning ongoing issues with his right leg ulcer. After I last saw him 2 weeks ago he was actually sent to the ER where subsequently he ended up being placed on dialysis. He is doing better although he still seems very tired I think still in general he is doing much better. Fortunately there is no signs of active infection at this time. No fevers, chills, nausea, vomiting, or diarrhea. 01/26/2020; this is a patient with a large irregular albeit superficial wound on the right anterior lower leg. He has been using Santyl. He has recently been initiated on dialysis after we sent him to the ER earlier this month. He describes the wound is being very painful. By his description this started as a small raised area that rapidly expanded. He does not have a lot of obvious venous issues. Looking back through the records this was described as initially traumatic in April. He has seen vascular surgery and not felt to have an arterial issue 02/03/2020 upon evaluation today patient actually appears to be doing a little better in regard to his ulcer although it has spread inferiorly the central portion of the wound and towards the upper has actually new skin growth. Fortunately there is no signs of active infection at this time which is great news. 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 With that being said some  of the more significant issues here such as angiosarcoma does not appear to be likely based on pathology report. It seems that were more in the arterial or possible calciphylaxis realm based on what I am seeing visually. 02/10/2020 on evaluation today patient actually making some progress in regard to his leg ulcer. There is a lot of new skin growth which is excellent he tells me he still having a lot of pain but in general this seems to be showing signs of dramatic improvement which is great news. Overall I think that he is continuing to tolerate the Santyl along with ABD pad and roll gauze to secure. 02/17/2020 on evaluation today patient appears to be doing well in regard to his wound. He has been tolerating the dressing changes without complication. Fortunately there is no signs of active infection at this time which is great news and overall very pleased with where he stands today. 02/24/2020 upon evaluation today patient appears to be doing very well with regard to his leg ulcer. He has been tolerating the dressing changes with the Santyl without complication. Fortunately there is no signs of active infection at this time. No fevers, chills, nausea, vomiting, or diarrhea. 03/09/2020 upon evaluation today patient appears to be doing very well in regard to his ulcer. He has been tolerating the dressing changes without complication. Fortunately there is no signs of active infection at this time. Overall I think he is making great progress there is no depth to the wound and overall the patient seems to be feeling much  better from a pain perspective as well. I am very pleased on all that he is doing so well. 03/23/2020 on evaluation today patient appears to be doing well with regard to his lower extremity ulcer. This is showing signs of excellent improvement he has just a very small area on the inferior portion of the wound that is still open. He still has pain but not nearly as bad as it was. 03/29/2020; this patient I do not usually see. He has a history of an extensive wound on the right anterior tibia area he has been using Santyl and is on Ace wrap at home with an ABD over the area. He changes the dressing himself. He still has 1 small area left at the most distal part of this wound almost all the rest of it is healed with scar tissue. In looking at the overall wound area he did have a few blisters which often means excessive fluid Objective Constitutional Sitting or standing Blood Pressure is within target range for patient.. Pulse regular and within target range for patient.Marland Kitchen Respirations regular, non-labored and within target range.. Temperature is normal and within the target range for the patient.Marland Kitchen Appears in no distress. Vitals Time Taken: 7:56 AM, Weight: 256 lbs, Temperature: 98.5 F, Pulse: 80 bpm, Respiratory Rate: 18 breaths/min, Blood Pressure: 123/85 mmHg. Cardiovascular Pedal pulses palpable. Not a lot of swelling in this leg.. General Notes: Wound exam; only 1 small open area in the most distal part of this wound. He does have blisters in the superior medial part of this wound x3 Integumentary (Hair, Skin) Wound #2 status is Open. Original cause of wound was Trauma. The wound is located on the Right,Anterior Lower Leg. The wound measures 1cm length x 0.8cm width x 0.1cm depth; 0.628cm^2 area and 0.063cm^3 volume. There is Fat Layer (Subcutaneous Tissue) exposed. There is no tunneling or undermining noted. There is a medium amount of serosanguineous drainage noted. The wound margin  is flat and  intact. There is large (67-100%) pink granulation within the wound bed. There is a small (1-33%) amount of necrotic tissue within the wound bed including Adherent Slough. 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 Bathing/ Shower/ Hygiene: May shower and wash wound with soap and water. Edema Control - Lymphedema / SCD / Other: Elevate legs to the level of the heart or above for 30 minutes daily and/or when sitting, a frequency of: Avoid standing for long periods of time. Exercise regularly Moisturize legs daily. WOUND #2: - Lower Leg Wound Laterality: Right, Anterior Cleanser: Wound Cleanser Every Other Day/30 Days Discharge Instructions: Cleanse the wound with wound cleanser prior to applying a clean dressing using gauze sponges, not tissue or cotton balls. Prim Dressing: KerraCel Ag Gelling Fiber Dressing, 2x2 in (silver alginate) (DME) (Generic) Every Other Day/30 Days ary Discharge Instructions: Apply silver alginate to wound bed as instructed Secondary Dressing: Woven Gauze Sponge, Non-Sterile 4x4 in (DME) (Generic) Every Other Day/30 Days Discharge Instructions: Apply over primary dressing as directed. Secondary Dressing: ABD Pad, 5x9 (DME) Every Other Day/30 Days Discharge Instructions: Apply over primary dressing as directed. Secured With: Elastic Bandage 4 inch (ACE bandage) (DME) (Generic) Every Other Day/30 Days Discharge Instructions: Secure with ACE bandage as directed. Secured With: [employer], 4.5x3.1 (in/yd) (DME) (Generic) Every Other Day/30 Days Discharge Instructions: Secure with Kerlix as directed. Wrap from bottom of ankle to bend of knee (not too  loose). Secured With: Paper T ape, 2x10 (in/yd) (DME) (Generic) Every Other Day/30 Days Discharge Instructions: Secure dressing with tape as directed. 1. I change the primary dressing to silver alginate hopefully to close this over. There did not seem to be any obvious reason for Santyl to continue 2. He has blisters in this healed area. This may mean excessive fluid. I did not see any obvious signs of systemic fluid overload [on dialysis] wondering whether he simply is not using compression Office manager) Signed: 03/29/2020 5:57:42 PM By: Linton Ham MD Entered By: Linton Ham on 03/29/2020 08:26:05 -------------------------------------------------------------------------------- SuperBill Details Patient Name: Date of Service: [name], [name] 03/29/2020 Medical Record Number: [id number] Patient Account Number: [id number] Date of Birth/Sex: Treating RN: March 12, 1966 (54 y.o. [name], [name] Primary Care Provider: Precious Haws Other Clinician: Referring Provider: Treating Provider/Extender: Norina Buzzard, Lemar Lofty in Treatment: 34 Diagnosis Coding ICD-10 Codes Code Description [phone] Abrasion, right lower leg, subsequent encounter L97.812 Non-pressure chronic ulcer of other part of right lower leg with fat layer exposed E11.622 Type 2 diabetes mellitus with other skin ulcer I89.0 Lymphedema, not elsewhere classified I87.2 Venous insufficiency (chronic) (peripheral) Facility Procedures CPT4 Code: [id number] Description: 99213 - WOUND CARE VISIT-LEV 3 EST PT Modifier: Quantity: 1 Physician Procedures : CPT4 Code Description Modifier [id number] 99213 - WC PHYS LEVEL 3 - EST PT ICD-10 Diagnosis Description A12.878 Non-pressure chronic ulcer of other part of right lower leg with fat layer exposed S80.811D Abrasion, right lower leg, subsequent encounter Quantity: 1 Electronic Signature(s) Signed: 03/29/2020 5:57:42 PM By: Linton Ham MD Entered  By: Linton Ham on 03/29/2020 67:67:20

## 2020-04-04 ENCOUNTER — Encounter: Payer: Self-pay | Admitting: Cardiology

## 2020-04-11 DIAGNOSIS — N2581 Secondary hyperparathyroidism of renal origin: Secondary | ICD-10-CM | POA: Diagnosis not present

## 2020-04-11 DIAGNOSIS — N186 End stage renal disease: Secondary | ICD-10-CM | POA: Diagnosis not present

## 2020-04-11 DIAGNOSIS — Z992 Dependence on renal dialysis: Secondary | ICD-10-CM | POA: Diagnosis not present

## 2020-04-12 ENCOUNTER — Encounter (HOSPITAL_BASED_OUTPATIENT_CLINIC_OR_DEPARTMENT_OTHER): Payer: Medicare Other | Admitting: Internal Medicine

## 2020-04-12 DIAGNOSIS — H34832 Tributary (branch) retinal vein occlusion, left eye, with macular edema: Secondary | ICD-10-CM | POA: Diagnosis not present

## 2020-04-12 DIAGNOSIS — H43823 Vitreomacular adhesion, bilateral: Secondary | ICD-10-CM | POA: Diagnosis not present

## 2020-04-13 ENCOUNTER — Other Ambulatory Visit: Payer: Self-pay

## 2020-04-13 ENCOUNTER — Encounter (HOSPITAL_BASED_OUTPATIENT_CLINIC_OR_DEPARTMENT_OTHER): Payer: Medicare Other | Attending: Physician Assistant | Admitting: Physician Assistant

## 2020-04-13 DIAGNOSIS — Z992 Dependence on renal dialysis: Secondary | ICD-10-CM | POA: Diagnosis not present

## 2020-04-13 DIAGNOSIS — E1140 Type 2 diabetes mellitus with diabetic neuropathy, unspecified: Secondary | ICD-10-CM | POA: Insufficient documentation

## 2020-04-13 DIAGNOSIS — I130 Hypertensive heart and chronic kidney disease with heart failure and stage 1 through stage 4 chronic kidney disease, or unspecified chronic kidney disease: Secondary | ICD-10-CM | POA: Insufficient documentation

## 2020-04-13 DIAGNOSIS — I5032 Chronic diastolic (congestive) heart failure: Secondary | ICD-10-CM | POA: Diagnosis not present

## 2020-04-13 DIAGNOSIS — N184 Chronic kidney disease, stage 4 (severe): Secondary | ICD-10-CM | POA: Diagnosis not present

## 2020-04-13 DIAGNOSIS — Z872 Personal history of diseases of the skin and subcutaneous tissue: Secondary | ICD-10-CM | POA: Insufficient documentation

## 2020-04-13 DIAGNOSIS — E1122 Type 2 diabetes mellitus with diabetic chronic kidney disease: Secondary | ICD-10-CM | POA: Diagnosis not present

## 2020-04-13 DIAGNOSIS — S80811A Abrasion, right lower leg, initial encounter: Secondary | ICD-10-CM | POA: Diagnosis not present

## 2020-04-13 DIAGNOSIS — I872 Venous insufficiency (chronic) (peripheral): Secondary | ICD-10-CM | POA: Diagnosis not present

## 2020-04-13 DIAGNOSIS — N186 End stage renal disease: Secondary | ICD-10-CM | POA: Diagnosis not present

## 2020-04-13 DIAGNOSIS — Z09 Encounter for follow-up examination after completed treatment for conditions other than malignant neoplasm: Secondary | ICD-10-CM | POA: Diagnosis not present

## 2020-04-13 DIAGNOSIS — L97812 Non-pressure chronic ulcer of other part of right lower leg with fat layer exposed: Secondary | ICD-10-CM | POA: Diagnosis not present

## 2020-04-13 DIAGNOSIS — I890 Lymphedema, not elsewhere classified: Secondary | ICD-10-CM | POA: Insufficient documentation

## 2020-04-13 DIAGNOSIS — N2581 Secondary hyperparathyroidism of renal origin: Secondary | ICD-10-CM | POA: Diagnosis not present

## 2020-04-13 NOTE — Progress Notes (Signed)
[name], [name] ([id number]) Visit Report for 04/13/2020 Arrival Information Details Patient Name: Date of Service: [name], [name] 04/13/2020 8:00 A M Medical Record Number: [id number] Patient Account Number: [id number] Date of Birth/Sex: Treating RN: 1966-03-10 (55 y.o. [name] Primary Care Tyneka Scafidi: Precious Haws Other Clinician: Referring Ilanna Deihl: Treating Deamber Buckhalter/Extender: Katheran Awe Weeks in Treatment: 71 Visit Information History Since Last Visit Added or deleted any medications: No Patient Arrived: Ambulatory Any new allergies or adverse reactions: No Arrival Time: 08:27 Had a fall or experienced change in No Accompanied By: self activities of daily living that may affect Transfer Assistance: None risk of falls: Patient Identification Verified: Yes Signs or symptoms of abuse/neglect since last visito No Secondary Verification Process Completed: Yes Hospitalized since last visit: No Patient Requires Transmission-Based Precautions: No Implantable device outside of the clinic excluding No Patient Has Alerts: Yes cellular tissue based products placed in the center Patient Alerts: R ABI =1.19, TBI = .93 since last visit: L ABI = 1.29, TBI = .86 Has Dressing in Place as Prescribed: Yes Pain Present Now: No Electronic Signature(s) Signed: 04/13/2020 11:11:03 AM By: Sandre Kitty Entered By: Sandre Kitty on 04/13/2020 08:28:15 -------------------------------------------------------------------------------- Clinic Level of Care Assessment Details Patient Name: Date of Service: [name], [name] 04/13/2020 8:00 A M Medical Record Number: [id number] Patient Account Number: [id number] Date of Birth/Sex: Treating RN: 1965/05/12 (55 y.o. [name] Primary Care Lonzy Mato: Precious Haws Other Clinician: Referring Mickal Meno: Treating Ludivina Guymon/Extender: [name], [name] in Treatment: 36 Clinic Level of Care Assessment Items TOOL 4  Quantity Score X- 1 0 Use when only an EandM is performed on FOLLOW-UP visit ASSESSMENTS - Nursing Assessment / Reassessment X- 1 10 Reassessment of Co-morbidities (includes updates in patient status) X- 1 5 Reassessment of Adherence to Treatment Plan ASSESSMENTS - Wound and Skin A ssessment / Reassessment X - Simple Wound Assessment / Reassessment - one wound 1 5 []  - 0 Complex Wound Assessment / Reassessment - multiple wounds []  - 0 Dermatologic / Skin Assessment (not related to wound area) ASSESSMENTS - Focused Assessment []  - 0 Circumferential Edema Measurements - multi extremities []  - 0 Nutritional Assessment / Counseling / Intervention X- 1 5 Lower Extremity Assessment (monofilament, tuning fork, pulses) []  - 0 Peripheral Arterial Disease Assessment (using hand held doppler) ASSESSMENTS - Ostomy and/or Continence Assessment and Care []  - 0 Incontinence Assessment and Management []  - 0 Ostomy Care Assessment and Management (repouching, etc.) PROCESS - Coordination of Care X - Simple Patient / Family Education for ongoing care 1 15 []  - 0 Complex (extensive) Patient / Family Education for ongoing care X- 1 10 Staff obtains Programmer, systems, Records, T Results / Process Orders est []  - 0 Staff telephones HHA, Nursing Homes / Clarify orders / etc []  - 0 Routine Transfer to another Facility (non-emergent condition) []  - 0 Routine Hospital Admission (non-emergent condition) []  - 0 New Admissions / Biomedical engineer / Ordering NPWT Apligraf, etc. , []  - 0 Emergency Hospital Admission (emergent condition) X- 1 10 Simple Discharge Coordination []  - 0 Complex (extensive) Discharge Coordination PROCESS - Special Needs []  - 0 Pediatric / Minor Patient Management []  - 0 Isolation Patient Management []  - 0 Hearing / Language / Visual special needs []  - 0 Assessment of Community assistance (transportation, D/C planning, etc.) []  - 0 Additional assistance / Altered  mentation []  - 0 Support Surface(s) Assessment (bed, cushion, seat, etc.) INTERVENTIONS - Wound Cleansing / Measurement X - Simple  Wound Cleansing - one wound 1 5 []  - 0 Complex Wound Cleansing - multiple wounds X- 1 5 Wound Imaging (photographs - any number of wounds) []  - 0 Wound Tracing (instead of photographs) X- 1 5 Simple Wound Measurement - one wound []  - 0 Complex Wound Measurement - multiple wounds INTERVENTIONS - Wound Dressings []  - 0 Small Wound Dressing one or multiple wounds []  - 0 Medium Wound Dressing one or multiple wounds []  - 0 Large Wound Dressing one or multiple wounds []  - 0 Application of Medications - topical []  - 0 Application of Medications - injection INTERVENTIONS - Miscellaneous []  - 0 External ear exam []  - 0 Specimen Collection (cultures, biopsies, blood, body fluids, etc.) []  - 0 Specimen(s) / Culture(s) sent or taken to Lab for analysis []  - 0 Patient Transfer (multiple staff / Civil Service fast streamer / Similar devices) []  - 0 Simple Staple / Suture removal (25 or less) []  - 0 Complex Staple / Suture removal (26 or more) []  - 0 Hypo / Hyperglycemic Management (close monitor of Blood Glucose) []  - 0 Ankle / Brachial Index (ABI) - do not check if billed separately X- 1 5 Vital Signs Has the patient been seen at the hospital within the last three years: Yes Total Score: 80 Level Of Care: New/Established - Level 3 Electronic Signature(s) Signed: 04/13/2020 5:18:45 PM By: Levan Hurst RN, BSN Entered By: Levan Hurst on 04/13/2020 10:03:34 -------------------------------------------------------------------------------- Encounter Discharge Information Details Patient Name: Date of Service: [name], [name] 04/13/2020 8:00 A M Medical Record Number: [id number] Patient Account Number: [id number] Date of Birth/Sex: Treating RN: 1965-11-17 (55 y.o. [name] Primary Care Caylynn Minchew: Precious Haws Other Clinician: Referring Countess Biebel: Treating  Ames Hoban/Extender: [name], [name] in Treatment: 36 Encounter Discharge Information Items Discharge Condition: Stable Ambulatory Status: Ambulatory Discharge Destination: Home Transportation: Private Auto Accompanied By: alone Schedule Follow-up Appointment: Yes Clinical Summary of Care: Patient Declined Electronic Signature(s) Signed: 04/13/2020 5:18:45 PM By: Levan Hurst RN, BSN Entered By: Levan Hurst on 04/13/2020 10:04:01 -------------------------------------------------------------------------------- Lower Extremity Assessment Details Patient Name: Date of Service: [name], [name] 04/13/2020 8:00 A M Medical Record Number: [id number] Patient Account Number: [id number] Date of Birth/Sex: Treating RN: 03/19/1966 (55 y.o. [name] Primary Care Karee Forge: Precious Haws Other Clinician: Referring Alijah Hyde: Treating Ledora Delker/Extender: [name], [name] in Treatment: 36 Edema Assessment Assessed: [Left: No] [Right: Yes] Edema: [Left: N] [Right: o] Calf Left: Right: Point of Measurement: 31 cm From Medial Instep 40.5 cm Ankle Left: Right: Point of Measurement: 8 cm From Medial Instep 24 cm Electronic Signature(s) Signed: 04/13/2020 5:17:23 PM By: Deon Pilling Entered By: Deon Pilling on 04/13/2020 08:41:56 -------------------------------------------------------------------------------- Multi-Disciplinary Care Plan Details Patient Name: Date of Service: [name], [name] 04/13/2020 8:00 A M Medical Record Number: [id number] Patient Account Number: [id number] Date of Birth/Sex: Treating RN: Apr 30, 1965 (55 y.o. [name] Primary Care Bernadene Garside: Precious Haws Other Clinician: Referring Arath Kaigler: Treating Jacarra Bobak/Extender: [name], [name] in Treatment: 54 Active Inactive Electronic Signature(s) Signed: 04/13/2020 5:18:45 PM By: Levan Hurst RN, BSN Entered By: Levan Hurst on 04/13/2020  10:02:43 -------------------------------------------------------------------------------- Pain Assessment Details Patient Name: Date of Service: [name], [name] 04/13/2020 8:00 A M Medical Record Number: [id number] Patient Account Number: [id number] Date of Birth/Sex: Treating RN: 1965-11-09 (55 y.o. [name] Primary Care Borna Wessinger: Precious Haws Other Clinician: Referring Davelle Anselmi: Treating Antavius Sperbeck/Extender: [name], [name] in Treatment: 36 Active Problems Location of Pain  Severity and Description of Pain Patient Has Paino No Site Locations Pain Management and Medication Current Pain Management: Electronic Signature(s) Signed: 04/13/2020 11:11:03 AM By: Sandre Kitty Signed: 04/13/2020 5:18:45 PM By: Levan Hurst RN, BSN Entered By: Sandre Kitty on 04/13/2020 08:28:40 -------------------------------------------------------------------------------- Patient/Caregiver Education Details Patient Name: Date of Service: [name], [name] 1/5/2022andnbsp8:00 A M Medical Record Number: [id number] Patient Account Number: [id number] Date of Birth/Gender: Treating RN: Jul 26, 1965 (55 y.o. [name] Primary Care Physician: Precious Haws Other Clinician: Referring Physician: Treating Physician/Extender: Burnard Hawthorne in Treatment: 76 Education Assessment Education Provided To: Patient Education Topics Provided Wound/Skin Impairment: Methods: Explain/Verbal Responses: State content correctly Electronic Signature(s) Signed: 04/13/2020 5:18:45 PM By: Levan Hurst RN, BSN Entered By: Levan Hurst on 04/13/2020 10:02:56 -------------------------------------------------------------------------------- Wound Assessment Details Patient Name: Date of Service: [name], [name] 04/13/2020 8:00 A M Medical Record Number: [id number] Patient Account Number: [id number] Date of Birth/Sex: Treating RN: Jan 22, 1966 (55 y.o. [name] Primary Care Gedeon Brandow: Precious Haws Other Clinician: Referring Tiegan Terpstra: Treating Inaki Vantine/Extender: [name], [name] in Treatment: 36 Wound Status Wound Number: 2 Primary Etiology: Venous Leg Ulcer Wound Location: Right, Anterior Lower Leg Wound Status: Open Wounding Event: Trauma Date Acquired: 11/30/2019 Weeks Of Treatment: 19 Clustered Wound: No Wound Measurements Length: (cm) Width: (cm) Depth: (cm) Area: (cm) Volume: (cm) 0 % Reduction in Area: 100% 0 % Reduction in Volume: 100% 0 0 0 Wound Description Classification: Full Thickness Without Exposed Support Structur es Electronic Signature(s) Signed: 04/13/2020 11:11:03 AM By: Sandre Kitty Signed: 04/13/2020 5:18:45 PM By: Levan Hurst RN, BSN Entered By: Sandre Kitty on 04/13/2020 08:30:33 -------------------------------------------------------------------------------- Vitals Details Patient Name: Date of Service: [name], [name] 04/13/2020 8:00 A M Medical Record Number: [id number] Patient Account Number: [id number] Date of Birth/Sex: Treating RN: 1966-02-08 (55 y.o. [name] Primary Care Payal Stanforth: Precious Haws Other Clinician: Referring Yukari Flax: Treating Jessey Stehlin/Extender: [name], [name] in Treatment: 36 Vital Signs Time Taken: 08:28 Temperature (F): 98.0 Weight (lbs): 256 Pulse (bpm): 84 Respiratory Rate (breaths/min): 18 Blood Pressure (mmHg): 171/89 Reference Range: 80 - 120 mg / dl Electronic Signature(s) Signed: 04/13/2020 11:11:03 AM By: Sandre Kitty Entered By: Sandre Kitty on 04/13/2020 08:28:33

## 2020-04-13 NOTE — Progress Notes (Addendum)
[name], [name] ([id number]) Visit Report for 04/13/2020 Chief Complaint Document Details Patient Name: Date of Service: [name], [name] 04/13/2020 8:00 A M Medical Record Number: [id number] Patient Account Number: [id number] Date of Birth/Sex: Treating RN: 07/10/1965 (55 y.o. [name] Primary Care Provider: Precious Haws Other Clinician: Referring Provider: Treating Provider/Extender: Katheran Awe Weeks in Treatment: 36 Information Obtained from: Patient Chief Complaint 08/04/2019; patient is here for review of the wound on the right lateral lower leg Electronic Signature(s) Signed: 04/13/2020 8:17:27 AM By: Worthy Keeler PA-C Entered By: Worthy Keeler on 04/13/2020 08:17:27 -------------------------------------------------------------------------------- HPI Details Patient Name: Date of Service: [name], [name] 04/13/2020 8:00 A M Medical Record Number: [id number] Patient Account Number: [id number] Date of Birth/Sex: Treating RN: [birth year] (55 y.o. [name] Primary Care Provider: Precious Haws Other Clinician: Referring Provider: Treating Provider/Extender: Katheran Awe Weeks in Treatment: 36 History of Present Illness HPI Description: ADMISSION 08/04/2019 This is a 55 year old man who traumatized his right lateral lower leg while going up an escalator at the mall sometime in December 2020. He was referred to vascular surgery and on 04/14/2019 he saw Dr. Wenda Overland. He was felt to have "palpable pulses and normal noninvasive studies" although I cannot see these results. He was back in his primary doctor's office on 07/30/2019 for a nonhealing right lower extremity wound. He was referred to wound care I think in [facility] but his insurance was out of network. A CNS was ordered. X-ray was done that was negative. He has been using wound cleanser and a Band-Aid. Past medical history is actually quite extensive and includes chronic kidney disease  stage IV, chronic diastolic heart failure, atrial fibrillation, poorly controlled hypertension, poorly controlled diabetes with peripheral neuropathy and a recent hemoglobin A1c of 11.5, depression, lymphedema and sleep apnea ABI in our clinic was 1.1 in the right 5/6; this wound was initially traumatized while the patient was going down on an escalator. Arrived in clinic last week with a completely nonviable surface. He required mechanical debridement and we have been using Iodoflex under compression. Apparently the compression wraps fell down. He also has poorly controlled diabetes 5/15; traumatic/contusion wound to the right lateral lower leg. We have been using Iodoflex to clean at the surface of this which was completely nonviable along with mechanical debridements. He is under compression. 09/02/2019 upon evaluation today patient appears to be doing well with regard to his wound on his right lateral leg. He has been tolerating the dressing changes without complication. Fortunately there is no signs of active infection at this time. No fevers, chills, nausea, vomiting, or diarrhea. With that being said there is some necrotic tissue around the edges of the wound that is and require some sharp debridement today. That was discussed with the patient and his mother in the office today prior to debridement to which she did consent. 09/09/2019 upon evaluation today patient appears to be making progress in regard to his wound. Fortunately the wound is not nearly as deep as what it was. It is measuring a little bit larger due to having to clear some of the edges away but again that is necessary to get this to heal. Fortunately there is no evidence of active infection at this time. No fevers, chills, nausea, vomiting, or diarrhea. 09/16/2019 on evaluation today patient appears to be doing excellent in regard to his wound currently. In fact I do not see anything that we can need to debride  today which is great  news. The wound bed looks healthy and the edges of the wound look like they are doing great. Overall I feel like we are at a good place and headed in the proper direction. 09/23/2019 upon evaluation today patient appears to be doing okay in regard to his wound. This is not measuring any smaller it is about the same. With that being said he tells me that his wrap actually slid down he ended up having to take this off he was on a trip and did not have his Velcro compression with him. With that being said I do not see any signs of anything significantly worsening which is good news. 09/30/2019 upon evaluation today patient appears to be doing a little bit better in regard to his leg ulcer. He has been tolerating the dressing changes without complication. Fortunately there is no signs of active infection at this time. No fevers, chills, nausea, vomiting, or diarrhea. 10/14/2019 upon evaluation today patient's wound appears to be healthier. Overall but unfortunately is measuring a little bit bigger. With that being said I do think that we do need to still continue to wrap his leg. Fortunately there is no signs of active infection at this time which is great news. 11/18/2019 upon evaluation today patient actually appears to be doing decently well with regard to his wound. He has been tolerating the dressing changes without complication. Fortunately there is no signs of active infection has been using just Neosporin at this point since he was in the hospital. He did have a venous Doppler study in the hospital though I cannot see the results that was from November 04, 2019. Nonetheless overall I am pleased with the fact the patient does seem to be doing better. 11/25/2019 on evaluation today patient appears to be doing well with regard to his wounds. He has been tolerating the dressing changes without complication. Fortunately there is no signs of active infection at this time. No fevers, chills, nausea, vomiting, or  diarrhea. I do believe that he does not appear to be showing any signs of worsening although he also seems to be having some trouble getting this to proceed in a good fashion towards healing. 12/02/2019 on evaluation today patient actually appears to be doing not quite as well in regard to his wounds. Both have some slough noted on the surface of the wound. Unfortunately he is continuing to have some issues here with pain as well in the anterior portion which I think is an issue. Fortunately there is no signs of active infection at this time which is great news. No fevers, chills, nausea, vomiting, or diarrhea. 12/30/2019 upon evaluation today patient unfortunately has not been seen in the past month we are just now seeing him back for reevaluation today last time I saw him was August 25. At that point he had a lateral wound on his leg and an anterior wound which which is starting. The lateral has healed unfortunately the anterior looks worse. With that being said the patient unfortunately seems to be having some issues today that I cannot completely explain by just the way the wound appears. The wound itself is not really hot to touch and to be honest I would not necessarily assume infection upon initial inspection. With that being said he is acting very lethargic, was weak walking into the clinic according to his mother who is with him today, his blood pressure is actually in a normal range at around 110/73 which is  unusual for him he normally runs extremely high in the 161W for systolic. His blood glucose was 227 here in the clinic today respiratory rate was 18 and his temperature was 97.7. Pulse was right around 70 when I checked this manually. Nonetheless although his vital signs and normal circumstances would be reassuring I am still kind of concerned just because of the way he is acting. I did question him about medications that he has taken he told me that he took 2 Tylenol and tramadol this  morning he also tells me he took 2 tramadol last night. With that being said I do not believe that just taking the tramadol is accounting for what is going on as well based on what I see today. I discussed all this with his mother as well as the patient face-to-face in the office today and subsequently I think I would recommend further evaluation at the ER as I really feel like there is something going on here that I cannot identify just here in the clinic. 01/13/2020 upon evaluation today patient presents for follow-up concerning ongoing issues with his right leg ulcer. After I last saw him 2 weeks ago he was actually sent to the ER where subsequently he ended up being placed on dialysis. He is doing better although he still seems very tired I think still in general he is doing much better. Fortunately there is no signs of active infection at this time. No fevers, chills, nausea, vomiting, or diarrhea. 01/26/2020; this is a patient with a large irregular albeit superficial wound on the right anterior lower leg. He has been using Santyl. He has recently been initiated on dialysis after we sent him to the ER earlier this month. He describes the wound is being very painful. By his description this started as a small raised area that rapidly expanded. He does not have a lot of obvious venous issues. Looking back through the records this was described as initially traumatic in April. He has seen vascular surgery and not felt to have an arterial issue 02/03/2020 upon evaluation today patient actually appears to be doing a little better in regard to his ulcer although it has spread inferiorly the central portion of the wound and towards the upper has actually new skin growth. Fortunately there is no signs of active infection at this time which is great news. 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 With that being said some of the more significant issues here such as angiosarcoma does not appear to be likely based on pathology report. It seems that were more in the arterial or possible calciphylaxis realm based on what I am seeing visually. 02/10/2020 on evaluation today patient actually making some progress in regard to his leg ulcer. There is a lot of new skin growth which is excellent he tells me he still having a lot of pain but in general this seems to be showing signs of dramatic improvement which is great news. Overall I think that he is continuing to tolerate the Santyl along with ABD pad and roll gauze to secure. 02/17/2020 on evaluation today patient appears to be doing well in regard to his wound. He has been tolerating the dressing changes without complication. Fortunately there is  no signs of active infection at this time which is great news and overall very pleased with where he stands today. 02/24/2020 upon evaluation today patient appears to be doing very well with regard to his leg ulcer. He has been tolerating the dressing changes with the Santyl without complication. Fortunately there is no signs of active infection at this time. No fevers, chills, nausea, vomiting, or diarrhea. 03/09/2020 upon evaluation today patient appears to be doing very well in regard to his ulcer. He has been tolerating the dressing changes without complication. Fortunately there is no signs of active infection at this time. Overall I think he is making great progress there is no depth to the wound and overall the patient seems to be feeling much better from a pain perspective as well. I am very pleased on all that he is doing so well. 03/23/2020 on evaluation today  patient appears to be doing well with regard to his lower extremity ulcer. This is showing signs of excellent improvement he has just a very small area on the inferior portion of the wound that is still open. He still has pain but not nearly as bad as it was. 03/29/2020; this patient I do not usually see. He has a history of an extensive wound on the right anterior tibia area he has been using Santyl and is on Ace wrap at home with an ABD over the area. He changes the dressing himself. He still has 1 small area left at the most distal part of this wound almost all the rest of it is healed with scar tissue. In looking at the overall wound area he did have a few blisters which often means excessive fluid 04/13/2020 on evaluation today patient appears to be doing well with regard to his leg in fact this appears to be completely healed which is great news. Fortunately there is no signs of active infection at this time which is also great news. No fevers, chills, nausea, vomiting, or diarrhea. Electronic Signature(s) Signed: 04/13/2020 9:09:13 AM By: Worthy Keeler PA-C Entered By: Worthy Keeler on 04/13/2020 09:09:12 -------------------------------------------------------------------------------- Physical Exam Details Patient Name: Date of Service: [name], [name] 04/13/2020 8:00 A M Medical Record Number: [id number] Patient Account Number: [id number] Date of Birth/Sex: Treating RN: Feb 04, 1966 (55 y.o. [name] Primary Care Provider: Precious Haws Other Clinician: Referring Provider: Treating Provider/Extender: [name], [name] in Treatment: 5 Constitutional Well-nourished and well-hydrated in no acute distress. Respiratory normal breathing without difficulty. Psychiatric this patient is able to make decisions and demonstrates good insight into disease process. Alert and Oriented x 3. pleasant and cooperative. Notes Upon inspection patient's wound bed actually showed  signs of good epithelization there does not appear to anything open and overall he seems to be doing quite well. Fortunately there is no evidence of systemic infection at this time which is also good news. Electronic Signature(s) Signed: 04/13/2020 9:09:27 AM By: Worthy Keeler PA-C Entered By: Worthy Keeler on 04/13/2020 09:09:27 -------------------------------------------------------------------------------- Physician Orders Details Patient Name: Date of Service: [name], [name] 04/13/2020 8:00 A M Medical Record Number: [id number] Patient Account Number: [id number] Date of Birth/Sex: Treating RN: November 14, 1965 (55 y.o. [name] Primary Care Provider: Precious Haws Other Clinician: Referring Provider: Treating Provider/Extender: Katheran Awe Weeks in Treatment: 41 Verbal / Phone Orders: No Diagnosis Coding ICD-10 Coding Code Description S80.811D Abrasion, right lower leg, subsequent encounter L97.812 Non-pressure chronic ulcer of other part of right lower  leg with fat layer exposed E11.622 Type 2 diabetes mellitus with other skin ulcer I89.0 Lymphedema, not elsewhere classified I87.2 Venous insufficiency (chronic) (peripheral) Discharge From [facility] Discharge from [place] Edema Control - Lymphedema / SCD / Other Bilateral Lower Extremities Elevate legs to the level of the heart or above for 30 minutes daily and/or when sitting, a frequency of: - throughout the day Avoid standing for long periods of time. Exercise regularly Moisturize legs daily. Additional Orders / Instructions Other: - Apply Betadine to dark area on right lower leg daily Electronic Signature(s) Signed: 04/13/2020 4:44:38 PM By: Worthy Keeler PA-C Signed: 04/13/2020 5:18:45 PM By: Levan Hurst RN, BSN Entered By: Levan Hurst on 04/13/2020 09:08:37 -------------------------------------------------------------------------------- Problem List Details Patient Name: Date of  Service: [name], [name] 04/13/2020 8:00 A M Medical Record Number: [id number] Patient Account Number: [id number] Date of Birth/Sex: Treating RN: Sep 14, 1965 (55 y.o. [name] Primary Care Provider: Precious Haws Other Clinician: Referring Provider: Treating Provider/Extender: Katheran Awe Weeks in Treatment: 36 Active Problems ICD-10 Encounter Code Description Active Date MDM Diagnosis S80.811D Abrasion, right lower leg, subsequent encounter 08/04/2019 No Yes L97.812 Non-pressure chronic ulcer of other part of right lower leg with fat layer 08/04/2019 No Yes exposed E11.622 Type 2 diabetes mellitus with other skin ulcer 08/04/2019 No Yes I89.0 Lymphedema, not elsewhere classified 08/04/2019 No Yes I87.2 Venous insufficiency (chronic) (peripheral) 09/30/2019 No Yes Inactive Problems Resolved Problems Electronic Signature(s) Signed: 04/13/2020 8:17:19 AM By: Worthy Keeler PA-C Entered By: Worthy Keeler on 04/13/2020 08:17:19 -------------------------------------------------------------------------------- Progress Note Details Patient Name: Date of Service: [name], [name] 04/13/2020 8:00 A M Medical Record Number: [id number] Patient Account Number: [id number] Date of Birth/Sex: Treating RN: 1965-12-07 (55 y.o. [name] Primary Care Provider: Precious Haws Other Clinician: Referring Provider: Treating Provider/Extender: Katheran Awe Weeks in Treatment: 36 Subjective Chief Complaint Information obtained from Patient 08/04/2019; patient is here for review of the wound on the right lateral lower leg History of Present Illness (HPI) ADMISSION 08/04/2019 This is a 55 year old man who traumatized his right lateral lower leg while going [place] an escalator at the mall sometime in December 2020. He was referred to vascular surgery and on 04/14/2019 he saw Dr. Wenda Overland. He was felt to have "palpable pulses and normal noninvasive studies" although I  cannot see these results. He was back in his primary doctor's office on 07/30/2019 for a nonhealing right lower extremity wound. He was referred to wound care I think in [facility] but his insurance was out of network. A CNS was ordered. X-ray was done that was negative. He has been using wound cleanser and a Band-Aid. Past medical history is actually quite extensive and includes chronic kidney disease stage IV, chronic diastolic heart failure, atrial fibrillation, poorly controlled hypertension, poorly controlled diabetes with peripheral neuropathy and a recent hemoglobin A1c of 11.5, depression, lymphedema and sleep apnea ABI in our clinic was 1.1 in the right 5/6; this wound was initially traumatized while the patient was going down on an escalator. Arrived in clinic last week with a completely nonviable surface. He required mechanical debridement and we have been using Iodoflex under compression. Apparently the compression wraps fell down. He also has poorly controlled diabetes 5/15; traumatic/contusion wound to the right lateral lower leg. We have been using Iodoflex to clean at the surface of this which was completely nonviable along with mechanical debridements. He is under compression. 09/02/2019 upon evaluation today  patient appears to be doing well with regard to his wound on his right lateral leg. He has been tolerating the dressing changes without complication. Fortunately there is no signs of active infection at this time. No fevers, chills, nausea, vomiting, or diarrhea. With that being said there is some necrotic tissue around the edges of the wound that is and require some sharp debridement today. That was discussed with the patient and his mother in the office today prior to debridement to which she did consent. 09/09/2019 upon evaluation today patient appears to be making progress in regard to his wound. Fortunately the wound is not nearly as deep as what it was. It is measuring a  little bit larger due to having to clear some of the edges away but again that is necessary to get this to heal. Fortunately there is no evidence of active infection at this time. No fevers, chills, nausea, vomiting, or diarrhea. 09/16/2019 on evaluation today patient appears to be doing excellent in regard to his wound currently. In fact I do not see anything that we can need to debride today which is great news. The wound bed looks healthy and the edges of the wound look like they are doing great. Overall I feel like we are at a good place and headed in the proper direction. 09/23/2019 upon evaluation today patient appears to be doing okay in regard to his wound. This is not measuring any smaller it is about the same. With that being said he tells me that his wrap actually slid down he ended up having to take this off he was on a trip and did not have his Velcro compression with him. With that being said I do not see any signs of anything significantly worsening which is good news. 09/30/2019 upon evaluation today patient appears to be doing a little bit better in regard to his leg ulcer. He has been tolerating the dressing changes without complication. Fortunately there is no signs of active infection at this time. No fevers, chills, nausea, vomiting, or diarrhea. 10/14/2019 upon evaluation today patient's wound appears to be healthier. Overall but unfortunately is measuring a little bit bigger. With that being said I do think that we do need to still continue to wrap his leg. Fortunately there is no signs of active infection at this time which is great news. 11/18/2019 upon evaluation today patient actually appears to be doing decently well with regard to his wound. He has been tolerating the dressing changes without complication. Fortunately there is no signs of active infection has been using just Neosporin at this point since he was in the hospital. He did have a venous Doppler study in the hospital  though I cannot see the results that was from November 04, 2019. Nonetheless overall I am pleased with the fact the patient does seem to be doing better. 11/25/2019 on evaluation today patient appears to be doing well with regard to his wounds. He has been tolerating the dressing changes without complication. Fortunately there is no signs of active infection at this time. No fevers, chills, nausea, vomiting, or diarrhea. I do believe that he does not appear to be showing any signs of worsening although he also seems to be having some trouble getting this to proceed in a good fashion towards healing. 12/02/2019 on evaluation today patient actually appears to be doing not quite as well in regard to his wounds. Both have some slough noted on the surface of the wound. Unfortunately he is  continuing to have some issues here with pain as well in the anterior portion which I think is an issue. Fortunately there is no signs of active infection at this time which is great news. No fevers, chills, nausea, vomiting, or diarrhea. 12/30/2019 upon evaluation today patient unfortunately has not been seen in the past month we are just now seeing him back for reevaluation today last time I saw him was August 25. At that point he had a lateral wound on his leg and an anterior wound which which is starting. The lateral has healed unfortunately the anterior looks worse. With that being said the patient unfortunately seems to be having some issues today that I cannot completely explain by just the way the wound appears. The wound itself is not really hot to touch and to be honest I would not necessarily assume infection upon initial inspection. With that being said he is acting very lethargic, was weak walking into the clinic according to his mother who is with him today, his blood pressure is actually in a normal range at around 110/73 which is unusual for him he normally runs extremely high in the 660Y for systolic. His blood  glucose was 227 here in the clinic today respiratory rate was 18 and his temperature was 97.7. Pulse was right around 70 when I checked this manually. Nonetheless although his vital signs and normal circumstances would be reassuring I am still kind of concerned just because of the way he is acting. I did question him about medications that he has taken he told me that he took 2 Tylenol and tramadol this morning he also tells me he took 2 tramadol last night. With that being said I do not believe that just taking the tramadol is accounting for what is going on as well based on what I see today. I discussed all this with his mother as well as the patient face-to-face in the office today and subsequently I think I would recommend further evaluation at the ER as I really feel like there is something going on here that I cannot identify just here in the clinic. 01/13/2020 upon evaluation today patient presents for follow-up concerning ongoing issues with his right leg ulcer. After I last saw him 2 weeks ago he was actually sent to the ER where subsequently he ended up being placed on dialysis. He is doing better although he still seems very tired I think still in general he is doing much better. Fortunately there is no signs of active infection at this time. No fevers, chills, nausea, vomiting, or diarrhea. 01/26/2020; this is a patient with a large irregular albeit superficial wound on the right anterior lower leg. He has been using Santyl. He has recently been initiated on dialysis after we sent him to the ER earlier this month. He describes the wound is being very painful. By his description this started as a small raised area that rapidly expanded. He does not have a lot of obvious venous issues. Looking back through the records this was described as initially traumatic in April. He has seen vascular surgery and not felt to have an arterial issue 02/03/2020 upon evaluation today patient actually appears  to be doing a little better in regard to his ulcer although it has spread inferiorly the central portion of the wound and towards the upper has actually new skin growth. Fortunately there is no signs of active infection at this time which is great news. 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 With that being said some of the more significant issues here such as angiosarcoma does not appear to be likely based on pathology report. It seems that were more in the arterial or possible calciphylaxis realm based on what I am seeing visually. 02/10/2020 on evaluation today patient actually making some progress in regard to his leg ulcer. There is a lot of new skin growth which is excellent he tells me he still having a lot of pain but in general this seems to be showing signs of dramatic improvement which is great news. Overall I think that he is continuing to tolerate the Santyl along with ABD pad and roll gauze to secure. 02/17/2020 on evaluation today patient appears to be doing well in regard to his wound. He has been tolerating the dressing changes without complication. Fortunately there is no signs of active infection at this time which is great news and overall very pleased with where he stands today. 02/24/2020 upon evaluation today patient appears to be doing very well with regard to his leg ulcer. He has been tolerating the dressing changes with the Santyl without complication. Fortunately there is no signs of active infection at this time. No fevers, chills, nausea, vomiting, or diarrhea. 03/09/2020 upon evaluation today patient appears to be doing very well  in regard to his ulcer. He has been tolerating the dressing changes without complication. Fortunately there is no signs of active infection at this time. Overall I think he is making great progress there is no depth to the wound and overall the patient seems to be feeling much better from a pain perspective as well. I am very pleased on all that he is doing so well. 03/23/2020 on evaluation today patient appears to be doing well with regard to his lower extremity ulcer. This is showing signs of excellent improvement he has just a very small area on the inferior portion of the wound that is still open. He still has pain but not nearly as bad as it was. 03/29/2020; this patient I do not usually see. He has a history of an extensive wound on the right anterior tibia area he has been using Santyl and is on Ace wrap at home with an ABD over the area. He changes the dressing himself. He still has 1 small area left at the most distal part of this wound almost all the rest of it is healed with scar tissue. In looking at the overall wound area he did have a few blisters which often means excessive fluid 04/13/2020 on evaluation today patient appears to be doing well with regard to his leg in fact this appears to be completely healed which is great news. Fortunately there is no signs of active infection at this time which is also great news. No fevers, chills, nausea, vomiting, or diarrhea. Objective Constitutional Well-nourished and well-hydrated in no acute distress. Vitals Time Taken: 8:28 AM, Weight: 256 lbs, Temperature: 98.0 F, Pulse: 84 bpm, Respiratory Rate: 18 breaths/min, Blood Pressure: 171/89 mmHg. Respiratory normal breathing without difficulty. Psychiatric this patient is able to make decisions and demonstrates good insight into disease process. Alert and Oriented x 3. pleasant and cooperative. General Notes: Upon inspection patient's wound bed actually showed signs of good  epithelization there  does not appear to anything open and overall he seems to be doing quite well. Fortunately there is no evidence of systemic infection at this time which is also good news. Integumentary (Hair, Skin) Wound #2 status is Open. Original cause of wound was Trauma. The wound is located on the Right,Anterior Lower Leg. The wound measures 0cm length x 0cm width x 0cm depth; 0cm^2 area and 0cm^3 volume. 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 Exercise regularly Moisturize legs daily. Additional Orders / Instructions: Other: - Apply Betadine to dark area on right lower leg daily 1. Would recommend currently that we going discontinue wound care services as the patient appears to be completely healed he is in agreement with that plan. 2. I am also can recommend currently that we have the patient continue to monitor for any signs of opening he did have a blister which is somewhat dried out there is nothing appearing to be under it right now this is around the medial aspect of his right ankle. With that being said I would recommend Betadine over this area and subsequently just doing this daily to help dry it up but otherwise unlikely there is any specific care or medication that is necessary. We will see the patient back for follow-up visit as needed. Electronic Signature(s) Signed: 04/13/2020 9:10:09 AM By: Worthy Keeler PA-C Entered By: Worthy Keeler on 04/13/2020  09:10:09 -------------------------------------------------------------------------------- SuperBill Details Patient Name: Date of Service: [name], [name] 04/13/2020 Medical Record Number: [id number] Patient Account Number: [id number] Date of Birth/Sex: Treating RN: 26-Apr-1965 (55 y.o. [name] Primary Care Provider: Precious Haws Other Clinician: Referring Provider: Treating Provider/Extender: [name], [name] in Treatment: 36 Diagnosis Coding ICD-10 Codes Code Description [phone] Abrasion, right lower leg, subsequent encounter L97.812 Non-pressure chronic ulcer of other part of right lower leg with fat layer exposed E11.622 Type 2 diabetes mellitus with other skin ulcer I89.0 Lymphedema, not elsewhere classified I87.2 Venous insufficiency (chronic) (peripheral) Facility Procedures CPT4 Code: [id number] Description: 99213 - WOUND CARE VISIT-LEV 3 EST PT Modifier: Quantity: 1 Physician Procedures : CPT4 Code Description Modifier [id number] - WC PHYS LEVEL 3 - EST PT ICD-10 Diagnosis Description S80.811D Abrasion, right lower leg, subsequent encounter L97.812 Non-pressure chronic ulcer of other part of right lower leg with fat layer exposed  E11.622 Type 2 diabetes mellitus with other skin ulcer I89.0 Lymphedema, not elsewhere classified Quantity: 1 Electronic Signature(s) Signed: 04/13/2020 4:44:38 PM By: Worthy Keeler PA-C Signed: 04/13/2020 5:18:45 PM By: Levan Hurst RN, BSN Previous Signature: 04/13/2020 9:10:22 AM Version By: Worthy Keeler PA-C Entered By: Levan Hurst on 04/13/2020 10:03:43

## 2020-04-15 DIAGNOSIS — Z992 Dependence on renal dialysis: Secondary | ICD-10-CM | POA: Diagnosis not present

## 2020-04-15 DIAGNOSIS — N186 End stage renal disease: Secondary | ICD-10-CM | POA: Diagnosis not present

## 2020-04-15 DIAGNOSIS — N2581 Secondary hyperparathyroidism of renal origin: Secondary | ICD-10-CM | POA: Diagnosis not present

## 2020-04-18 DIAGNOSIS — N186 End stage renal disease: Secondary | ICD-10-CM | POA: Diagnosis not present

## 2020-04-18 DIAGNOSIS — N2581 Secondary hyperparathyroidism of renal origin: Secondary | ICD-10-CM | POA: Diagnosis not present

## 2020-04-18 DIAGNOSIS — Z992 Dependence on renal dialysis: Secondary | ICD-10-CM | POA: Diagnosis not present

## 2020-04-20 DIAGNOSIS — N186 End stage renal disease: Secondary | ICD-10-CM | POA: Diagnosis not present

## 2020-04-20 DIAGNOSIS — N2581 Secondary hyperparathyroidism of renal origin: Secondary | ICD-10-CM | POA: Diagnosis not present

## 2020-04-20 DIAGNOSIS — Z992 Dependence on renal dialysis: Secondary | ICD-10-CM | POA: Diagnosis not present

## 2020-04-22 DIAGNOSIS — N186 End stage renal disease: Secondary | ICD-10-CM | POA: Diagnosis not present

## 2020-04-22 DIAGNOSIS — Z992 Dependence on renal dialysis: Secondary | ICD-10-CM | POA: Diagnosis not present

## 2020-04-22 DIAGNOSIS — N2581 Secondary hyperparathyroidism of renal origin: Secondary | ICD-10-CM | POA: Diagnosis not present

## 2020-04-25 DIAGNOSIS — N186 End stage renal disease: Secondary | ICD-10-CM | POA: Diagnosis not present

## 2020-04-25 DIAGNOSIS — Z992 Dependence on renal dialysis: Secondary | ICD-10-CM | POA: Diagnosis not present

## 2020-04-25 DIAGNOSIS — N2581 Secondary hyperparathyroidism of renal origin: Secondary | ICD-10-CM | POA: Diagnosis not present

## 2020-04-27 DIAGNOSIS — N2581 Secondary hyperparathyroidism of renal origin: Secondary | ICD-10-CM | POA: Diagnosis not present

## 2020-04-27 DIAGNOSIS — N186 End stage renal disease: Secondary | ICD-10-CM | POA: Diagnosis not present

## 2020-04-27 DIAGNOSIS — Z992 Dependence on renal dialysis: Secondary | ICD-10-CM | POA: Diagnosis not present

## 2020-04-29 DIAGNOSIS — N2581 Secondary hyperparathyroidism of renal origin: Secondary | ICD-10-CM | POA: Diagnosis not present

## 2020-04-29 DIAGNOSIS — Z992 Dependence on renal dialysis: Secondary | ICD-10-CM | POA: Diagnosis not present

## 2020-04-29 DIAGNOSIS — N186 End stage renal disease: Secondary | ICD-10-CM | POA: Diagnosis not present

## 2020-05-02 DIAGNOSIS — N2581 Secondary hyperparathyroidism of renal origin: Secondary | ICD-10-CM | POA: Diagnosis not present

## 2020-05-02 DIAGNOSIS — Z992 Dependence on renal dialysis: Secondary | ICD-10-CM | POA: Diagnosis not present

## 2020-05-02 DIAGNOSIS — N186 End stage renal disease: Secondary | ICD-10-CM | POA: Diagnosis not present

## 2020-05-04 DIAGNOSIS — Z992 Dependence on renal dialysis: Secondary | ICD-10-CM | POA: Diagnosis not present

## 2020-05-04 DIAGNOSIS — N2581 Secondary hyperparathyroidism of renal origin: Secondary | ICD-10-CM | POA: Diagnosis not present

## 2020-05-04 DIAGNOSIS — N186 End stage renal disease: Secondary | ICD-10-CM | POA: Diagnosis not present

## 2020-05-06 DIAGNOSIS — N2581 Secondary hyperparathyroidism of renal origin: Secondary | ICD-10-CM | POA: Diagnosis not present

## 2020-05-06 DIAGNOSIS — Z992 Dependence on renal dialysis: Secondary | ICD-10-CM | POA: Diagnosis not present

## 2020-05-06 DIAGNOSIS — N186 End stage renal disease: Secondary | ICD-10-CM | POA: Diagnosis not present

## 2020-05-09 DIAGNOSIS — N2581 Secondary hyperparathyroidism of renal origin: Secondary | ICD-10-CM | POA: Diagnosis not present

## 2020-05-09 DIAGNOSIS — I129 Hypertensive chronic kidney disease with stage 1 through stage 4 chronic kidney disease, or unspecified chronic kidney disease: Secondary | ICD-10-CM | POA: Diagnosis not present

## 2020-05-09 DIAGNOSIS — N186 End stage renal disease: Secondary | ICD-10-CM | POA: Diagnosis not present

## 2020-05-09 DIAGNOSIS — Z992 Dependence on renal dialysis: Secondary | ICD-10-CM | POA: Diagnosis not present

## 2020-05-10 DIAGNOSIS — H34832 Tributary (branch) retinal vein occlusion, left eye, with macular edema: Secondary | ICD-10-CM | POA: Diagnosis not present

## 2020-05-10 DIAGNOSIS — E113313 Type 2 diabetes mellitus with moderate nonproliferative diabetic retinopathy with macular edema, bilateral: Secondary | ICD-10-CM | POA: Diagnosis not present

## 2020-05-10 DIAGNOSIS — H35033 Hypertensive retinopathy, bilateral: Secondary | ICD-10-CM | POA: Diagnosis not present

## 2020-05-10 DIAGNOSIS — H43823 Vitreomacular adhesion, bilateral: Secondary | ICD-10-CM | POA: Diagnosis not present

## 2020-05-11 ENCOUNTER — Other Ambulatory Visit: Payer: Self-pay

## 2020-05-11 DIAGNOSIS — Z992 Dependence on renal dialysis: Secondary | ICD-10-CM | POA: Diagnosis not present

## 2020-05-11 DIAGNOSIS — N2581 Secondary hyperparathyroidism of renal origin: Secondary | ICD-10-CM | POA: Diagnosis not present

## 2020-05-11 DIAGNOSIS — N186 End stage renal disease: Secondary | ICD-10-CM | POA: Diagnosis not present

## 2020-05-12 DIAGNOSIS — N186 End stage renal disease: Secondary | ICD-10-CM | POA: Diagnosis not present

## 2020-05-12 DIAGNOSIS — Z452 Encounter for adjustment and management of vascular access device: Secondary | ICD-10-CM | POA: Diagnosis not present

## 2020-05-12 DIAGNOSIS — Z992 Dependence on renal dialysis: Secondary | ICD-10-CM | POA: Diagnosis not present

## 2020-05-13 DIAGNOSIS — N186 End stage renal disease: Secondary | ICD-10-CM | POA: Diagnosis not present

## 2020-05-13 DIAGNOSIS — Z992 Dependence on renal dialysis: Secondary | ICD-10-CM | POA: Diagnosis not present

## 2020-05-13 DIAGNOSIS — N2581 Secondary hyperparathyroidism of renal origin: Secondary | ICD-10-CM | POA: Diagnosis not present

## 2020-05-16 DIAGNOSIS — N2581 Secondary hyperparathyroidism of renal origin: Secondary | ICD-10-CM | POA: Diagnosis not present

## 2020-05-16 DIAGNOSIS — N186 End stage renal disease: Secondary | ICD-10-CM | POA: Diagnosis not present

## 2020-05-16 DIAGNOSIS — Z992 Dependence on renal dialysis: Secondary | ICD-10-CM | POA: Diagnosis not present

## 2020-05-18 DIAGNOSIS — N186 End stage renal disease: Secondary | ICD-10-CM | POA: Diagnosis not present

## 2020-05-18 DIAGNOSIS — N2581 Secondary hyperparathyroidism of renal origin: Secondary | ICD-10-CM | POA: Diagnosis not present

## 2020-05-18 DIAGNOSIS — Z992 Dependence on renal dialysis: Secondary | ICD-10-CM | POA: Diagnosis not present

## 2020-05-20 DIAGNOSIS — Z992 Dependence on renal dialysis: Secondary | ICD-10-CM | POA: Diagnosis not present

## 2020-05-20 DIAGNOSIS — N186 End stage renal disease: Secondary | ICD-10-CM | POA: Diagnosis not present

## 2020-05-20 DIAGNOSIS — N2581 Secondary hyperparathyroidism of renal origin: Secondary | ICD-10-CM | POA: Diagnosis not present

## 2020-05-23 DIAGNOSIS — N186 End stage renal disease: Secondary | ICD-10-CM | POA: Diagnosis not present

## 2020-05-23 DIAGNOSIS — Z992 Dependence on renal dialysis: Secondary | ICD-10-CM | POA: Diagnosis not present

## 2020-05-23 DIAGNOSIS — N2581 Secondary hyperparathyroidism of renal origin: Secondary | ICD-10-CM | POA: Diagnosis not present

## 2020-05-25 DIAGNOSIS — N2581 Secondary hyperparathyroidism of renal origin: Secondary | ICD-10-CM | POA: Diagnosis not present

## 2020-05-25 DIAGNOSIS — N186 End stage renal disease: Secondary | ICD-10-CM | POA: Diagnosis not present

## 2020-05-25 DIAGNOSIS — Z992 Dependence on renal dialysis: Secondary | ICD-10-CM | POA: Diagnosis not present

## 2020-05-27 DIAGNOSIS — Z992 Dependence on renal dialysis: Secondary | ICD-10-CM | POA: Diagnosis not present

## 2020-05-27 DIAGNOSIS — N186 End stage renal disease: Secondary | ICD-10-CM | POA: Diagnosis not present

## 2020-05-27 DIAGNOSIS — N2581 Secondary hyperparathyroidism of renal origin: Secondary | ICD-10-CM | POA: Diagnosis not present

## 2020-05-30 DIAGNOSIS — Z992 Dependence on renal dialysis: Secondary | ICD-10-CM | POA: Diagnosis not present

## 2020-05-30 DIAGNOSIS — N2581 Secondary hyperparathyroidism of renal origin: Secondary | ICD-10-CM | POA: Diagnosis not present

## 2020-05-30 DIAGNOSIS — N186 End stage renal disease: Secondary | ICD-10-CM | POA: Diagnosis not present

## 2020-06-01 DIAGNOSIS — N186 End stage renal disease: Secondary | ICD-10-CM | POA: Diagnosis not present

## 2020-06-01 DIAGNOSIS — N2581 Secondary hyperparathyroidism of renal origin: Secondary | ICD-10-CM | POA: Diagnosis not present

## 2020-06-01 DIAGNOSIS — Z992 Dependence on renal dialysis: Secondary | ICD-10-CM | POA: Diagnosis not present

## 2020-06-03 DIAGNOSIS — N2581 Secondary hyperparathyroidism of renal origin: Secondary | ICD-10-CM | POA: Diagnosis not present

## 2020-06-03 DIAGNOSIS — Z992 Dependence on renal dialysis: Secondary | ICD-10-CM | POA: Diagnosis not present

## 2020-06-03 DIAGNOSIS — N186 End stage renal disease: Secondary | ICD-10-CM | POA: Diagnosis not present

## 2020-06-06 DIAGNOSIS — N186 End stage renal disease: Secondary | ICD-10-CM | POA: Diagnosis not present

## 2020-06-06 DIAGNOSIS — N2581 Secondary hyperparathyroidism of renal origin: Secondary | ICD-10-CM | POA: Diagnosis not present

## 2020-06-06 DIAGNOSIS — Z992 Dependence on renal dialysis: Secondary | ICD-10-CM | POA: Diagnosis not present

## 2020-06-06 DIAGNOSIS — I129 Hypertensive chronic kidney disease with stage 1 through stage 4 chronic kidney disease, or unspecified chronic kidney disease: Secondary | ICD-10-CM | POA: Diagnosis not present

## 2020-06-07 DIAGNOSIS — H43823 Vitreomacular adhesion, bilateral: Secondary | ICD-10-CM | POA: Diagnosis not present

## 2020-06-07 DIAGNOSIS — H34832 Tributary (branch) retinal vein occlusion, left eye, with macular edema: Secondary | ICD-10-CM | POA: Diagnosis not present

## 2020-06-08 DIAGNOSIS — N2581 Secondary hyperparathyroidism of renal origin: Secondary | ICD-10-CM | POA: Diagnosis not present

## 2020-06-08 DIAGNOSIS — N186 End stage renal disease: Secondary | ICD-10-CM | POA: Diagnosis not present

## 2020-06-08 DIAGNOSIS — Z992 Dependence on renal dialysis: Secondary | ICD-10-CM | POA: Diagnosis not present

## 2020-06-08 DIAGNOSIS — D631 Anemia in chronic kidney disease: Secondary | ICD-10-CM | POA: Diagnosis not present

## 2020-06-08 DIAGNOSIS — D689 Coagulation defect, unspecified: Secondary | ICD-10-CM | POA: Diagnosis not present

## 2020-06-13 DIAGNOSIS — N2581 Secondary hyperparathyroidism of renal origin: Secondary | ICD-10-CM | POA: Diagnosis not present

## 2020-06-13 DIAGNOSIS — D631 Anemia in chronic kidney disease: Secondary | ICD-10-CM | POA: Diagnosis not present

## 2020-06-13 DIAGNOSIS — Z992 Dependence on renal dialysis: Secondary | ICD-10-CM | POA: Diagnosis not present

## 2020-06-13 DIAGNOSIS — D689 Coagulation defect, unspecified: Secondary | ICD-10-CM | POA: Diagnosis not present

## 2020-06-13 DIAGNOSIS — N186 End stage renal disease: Secondary | ICD-10-CM | POA: Diagnosis not present

## 2020-06-15 DIAGNOSIS — D631 Anemia in chronic kidney disease: Secondary | ICD-10-CM | POA: Diagnosis not present

## 2020-06-15 DIAGNOSIS — N2581 Secondary hyperparathyroidism of renal origin: Secondary | ICD-10-CM | POA: Diagnosis not present

## 2020-06-15 DIAGNOSIS — D689 Coagulation defect, unspecified: Secondary | ICD-10-CM | POA: Diagnosis not present

## 2020-06-15 DIAGNOSIS — Z992 Dependence on renal dialysis: Secondary | ICD-10-CM | POA: Diagnosis not present

## 2020-06-15 DIAGNOSIS — N186 End stage renal disease: Secondary | ICD-10-CM | POA: Diagnosis not present

## 2020-06-17 DIAGNOSIS — Z992 Dependence on renal dialysis: Secondary | ICD-10-CM | POA: Diagnosis not present

## 2020-06-17 DIAGNOSIS — N186 End stage renal disease: Secondary | ICD-10-CM | POA: Diagnosis not present

## 2020-06-17 DIAGNOSIS — D689 Coagulation defect, unspecified: Secondary | ICD-10-CM | POA: Diagnosis not present

## 2020-06-17 DIAGNOSIS — N2581 Secondary hyperparathyroidism of renal origin: Secondary | ICD-10-CM | POA: Diagnosis not present

## 2020-06-17 DIAGNOSIS — D631 Anemia in chronic kidney disease: Secondary | ICD-10-CM | POA: Diagnosis not present

## 2020-06-20 DIAGNOSIS — D509 Iron deficiency anemia, unspecified: Secondary | ICD-10-CM | POA: Diagnosis not present

## 2020-06-20 DIAGNOSIS — Z992 Dependence on renal dialysis: Secondary | ICD-10-CM | POA: Diagnosis not present

## 2020-06-20 DIAGNOSIS — N186 End stage renal disease: Secondary | ICD-10-CM | POA: Diagnosis not present

## 2020-06-20 DIAGNOSIS — D689 Coagulation defect, unspecified: Secondary | ICD-10-CM | POA: Diagnosis not present

## 2020-06-20 DIAGNOSIS — N2581 Secondary hyperparathyroidism of renal origin: Secondary | ICD-10-CM | POA: Diagnosis not present

## 2020-06-22 DIAGNOSIS — D689 Coagulation defect, unspecified: Secondary | ICD-10-CM | POA: Diagnosis not present

## 2020-06-22 DIAGNOSIS — N2581 Secondary hyperparathyroidism of renal origin: Secondary | ICD-10-CM | POA: Diagnosis not present

## 2020-06-22 DIAGNOSIS — Z992 Dependence on renal dialysis: Secondary | ICD-10-CM | POA: Diagnosis not present

## 2020-06-22 DIAGNOSIS — N186 End stage renal disease: Secondary | ICD-10-CM | POA: Diagnosis not present

## 2020-06-22 DIAGNOSIS — D509 Iron deficiency anemia, unspecified: Secondary | ICD-10-CM | POA: Diagnosis not present

## 2020-06-24 DIAGNOSIS — D689 Coagulation defect, unspecified: Secondary | ICD-10-CM | POA: Diagnosis not present

## 2020-06-24 DIAGNOSIS — N2581 Secondary hyperparathyroidism of renal origin: Secondary | ICD-10-CM | POA: Diagnosis not present

## 2020-06-24 DIAGNOSIS — Z992 Dependence on renal dialysis: Secondary | ICD-10-CM | POA: Diagnosis not present

## 2020-06-24 DIAGNOSIS — N186 End stage renal disease: Secondary | ICD-10-CM | POA: Diagnosis not present

## 2020-06-24 DIAGNOSIS — D509 Iron deficiency anemia, unspecified: Secondary | ICD-10-CM | POA: Diagnosis not present

## 2020-06-27 DIAGNOSIS — D689 Coagulation defect, unspecified: Secondary | ICD-10-CM | POA: Diagnosis not present

## 2020-06-27 DIAGNOSIS — D631 Anemia in chronic kidney disease: Secondary | ICD-10-CM | POA: Diagnosis not present

## 2020-06-27 DIAGNOSIS — Z992 Dependence on renal dialysis: Secondary | ICD-10-CM | POA: Diagnosis not present

## 2020-06-27 DIAGNOSIS — N2581 Secondary hyperparathyroidism of renal origin: Secondary | ICD-10-CM | POA: Diagnosis not present

## 2020-06-27 DIAGNOSIS — N186 End stage renal disease: Secondary | ICD-10-CM | POA: Diagnosis not present

## 2020-06-29 DIAGNOSIS — N2581 Secondary hyperparathyroidism of renal origin: Secondary | ICD-10-CM | POA: Diagnosis not present

## 2020-06-29 DIAGNOSIS — D631 Anemia in chronic kidney disease: Secondary | ICD-10-CM | POA: Diagnosis not present

## 2020-06-29 DIAGNOSIS — D689 Coagulation defect, unspecified: Secondary | ICD-10-CM | POA: Diagnosis not present

## 2020-06-29 DIAGNOSIS — Z992 Dependence on renal dialysis: Secondary | ICD-10-CM | POA: Diagnosis not present

## 2020-06-29 DIAGNOSIS — N186 End stage renal disease: Secondary | ICD-10-CM | POA: Diagnosis not present

## 2020-07-01 DIAGNOSIS — Z992 Dependence on renal dialysis: Secondary | ICD-10-CM | POA: Diagnosis not present

## 2020-07-01 DIAGNOSIS — N186 End stage renal disease: Secondary | ICD-10-CM | POA: Diagnosis not present

## 2020-07-01 DIAGNOSIS — D631 Anemia in chronic kidney disease: Secondary | ICD-10-CM | POA: Diagnosis not present

## 2020-07-01 DIAGNOSIS — D689 Coagulation defect, unspecified: Secondary | ICD-10-CM | POA: Diagnosis not present

## 2020-07-01 DIAGNOSIS — N2581 Secondary hyperparathyroidism of renal origin: Secondary | ICD-10-CM | POA: Diagnosis not present

## 2020-07-04 DIAGNOSIS — N2581 Secondary hyperparathyroidism of renal origin: Secondary | ICD-10-CM | POA: Diagnosis not present

## 2020-07-04 DIAGNOSIS — D689 Coagulation defect, unspecified: Secondary | ICD-10-CM | POA: Diagnosis not present

## 2020-07-04 DIAGNOSIS — Z992 Dependence on renal dialysis: Secondary | ICD-10-CM | POA: Diagnosis not present

## 2020-07-04 DIAGNOSIS — D631 Anemia in chronic kidney disease: Secondary | ICD-10-CM | POA: Diagnosis not present

## 2020-07-04 DIAGNOSIS — N186 End stage renal disease: Secondary | ICD-10-CM | POA: Diagnosis not present

## 2020-07-05 DIAGNOSIS — H34832 Tributary (branch) retinal vein occlusion, left eye, with macular edema: Secondary | ICD-10-CM | POA: Diagnosis not present

## 2020-07-05 DIAGNOSIS — H43823 Vitreomacular adhesion, bilateral: Secondary | ICD-10-CM | POA: Diagnosis not present

## 2020-07-06 DIAGNOSIS — Z992 Dependence on renal dialysis: Secondary | ICD-10-CM | POA: Diagnosis not present

## 2020-07-06 DIAGNOSIS — N2581 Secondary hyperparathyroidism of renal origin: Secondary | ICD-10-CM | POA: Diagnosis not present

## 2020-07-06 DIAGNOSIS — D689 Coagulation defect, unspecified: Secondary | ICD-10-CM | POA: Diagnosis not present

## 2020-07-06 DIAGNOSIS — D631 Anemia in chronic kidney disease: Secondary | ICD-10-CM | POA: Diagnosis not present

## 2020-07-06 DIAGNOSIS — N186 End stage renal disease: Secondary | ICD-10-CM | POA: Diagnosis not present

## 2020-07-07 DIAGNOSIS — Z992 Dependence on renal dialysis: Secondary | ICD-10-CM | POA: Diagnosis not present

## 2020-07-07 DIAGNOSIS — N186 End stage renal disease: Secondary | ICD-10-CM | POA: Diagnosis not present

## 2020-07-07 DIAGNOSIS — I129 Hypertensive chronic kidney disease with stage 1 through stage 4 chronic kidney disease, or unspecified chronic kidney disease: Secondary | ICD-10-CM | POA: Diagnosis not present

## 2020-07-08 DIAGNOSIS — N2581 Secondary hyperparathyroidism of renal origin: Secondary | ICD-10-CM | POA: Diagnosis not present

## 2020-07-08 DIAGNOSIS — Z992 Dependence on renal dialysis: Secondary | ICD-10-CM | POA: Diagnosis not present

## 2020-07-08 DIAGNOSIS — D689 Coagulation defect, unspecified: Secondary | ICD-10-CM | POA: Diagnosis not present

## 2020-07-08 DIAGNOSIS — N186 End stage renal disease: Secondary | ICD-10-CM | POA: Diagnosis not present

## 2020-07-11 DIAGNOSIS — N2581 Secondary hyperparathyroidism of renal origin: Secondary | ICD-10-CM | POA: Diagnosis not present

## 2020-07-11 DIAGNOSIS — N186 End stage renal disease: Secondary | ICD-10-CM | POA: Diagnosis not present

## 2020-07-11 DIAGNOSIS — Z992 Dependence on renal dialysis: Secondary | ICD-10-CM | POA: Diagnosis not present

## 2020-07-11 DIAGNOSIS — D631 Anemia in chronic kidney disease: Secondary | ICD-10-CM | POA: Diagnosis not present

## 2020-07-11 DIAGNOSIS — D689 Coagulation defect, unspecified: Secondary | ICD-10-CM | POA: Diagnosis not present

## 2020-07-13 DIAGNOSIS — D631 Anemia in chronic kidney disease: Secondary | ICD-10-CM | POA: Diagnosis not present

## 2020-07-13 DIAGNOSIS — Z992 Dependence on renal dialysis: Secondary | ICD-10-CM | POA: Diagnosis not present

## 2020-07-13 DIAGNOSIS — N186 End stage renal disease: Secondary | ICD-10-CM | POA: Diagnosis not present

## 2020-07-13 DIAGNOSIS — N2581 Secondary hyperparathyroidism of renal origin: Secondary | ICD-10-CM | POA: Diagnosis not present

## 2020-07-13 DIAGNOSIS — D689 Coagulation defect, unspecified: Secondary | ICD-10-CM | POA: Diagnosis not present

## 2020-07-15 DIAGNOSIS — N2581 Secondary hyperparathyroidism of renal origin: Secondary | ICD-10-CM | POA: Diagnosis not present

## 2020-07-15 DIAGNOSIS — Z992 Dependence on renal dialysis: Secondary | ICD-10-CM | POA: Diagnosis not present

## 2020-07-15 DIAGNOSIS — D689 Coagulation defect, unspecified: Secondary | ICD-10-CM | POA: Diagnosis not present

## 2020-07-15 DIAGNOSIS — N186 End stage renal disease: Secondary | ICD-10-CM | POA: Diagnosis not present

## 2020-07-15 DIAGNOSIS — D631 Anemia in chronic kidney disease: Secondary | ICD-10-CM | POA: Diagnosis not present

## 2020-07-18 DIAGNOSIS — N2581 Secondary hyperparathyroidism of renal origin: Secondary | ICD-10-CM | POA: Diagnosis not present

## 2020-07-18 DIAGNOSIS — D689 Coagulation defect, unspecified: Secondary | ICD-10-CM | POA: Diagnosis not present

## 2020-07-18 DIAGNOSIS — Z992 Dependence on renal dialysis: Secondary | ICD-10-CM | POA: Diagnosis not present

## 2020-07-18 DIAGNOSIS — N186 End stage renal disease: Secondary | ICD-10-CM | POA: Diagnosis not present

## 2020-07-18 DIAGNOSIS — D631 Anemia in chronic kidney disease: Secondary | ICD-10-CM | POA: Diagnosis not present

## 2020-07-20 DIAGNOSIS — N2581 Secondary hyperparathyroidism of renal origin: Secondary | ICD-10-CM | POA: Diagnosis not present

## 2020-07-20 DIAGNOSIS — Z992 Dependence on renal dialysis: Secondary | ICD-10-CM | POA: Diagnosis not present

## 2020-07-20 DIAGNOSIS — D689 Coagulation defect, unspecified: Secondary | ICD-10-CM | POA: Diagnosis not present

## 2020-07-20 DIAGNOSIS — N186 End stage renal disease: Secondary | ICD-10-CM | POA: Diagnosis not present

## 2020-07-20 DIAGNOSIS — D631 Anemia in chronic kidney disease: Secondary | ICD-10-CM | POA: Diagnosis not present

## 2020-07-22 DIAGNOSIS — I132 Hypertensive heart and chronic kidney disease with heart failure and with stage 5 chronic kidney disease, or end stage renal disease: Secondary | ICD-10-CM | POA: Diagnosis not present

## 2020-07-22 DIAGNOSIS — Z992 Dependence on renal dialysis: Secondary | ICD-10-CM | POA: Diagnosis not present

## 2020-07-22 DIAGNOSIS — E1122 Type 2 diabetes mellitus with diabetic chronic kidney disease: Secondary | ICD-10-CM | POA: Diagnosis not present

## 2020-07-22 DIAGNOSIS — N186 End stage renal disease: Secondary | ICD-10-CM | POA: Diagnosis not present

## 2020-07-22 DIAGNOSIS — E1142 Type 2 diabetes mellitus with diabetic polyneuropathy: Secondary | ICD-10-CM | POA: Diagnosis not present

## 2020-07-22 DIAGNOSIS — Z794 Long term (current) use of insulin: Secondary | ICD-10-CM | POA: Diagnosis not present

## 2020-07-22 DIAGNOSIS — N2581 Secondary hyperparathyroidism of renal origin: Secondary | ICD-10-CM | POA: Diagnosis not present

## 2020-07-22 DIAGNOSIS — D689 Coagulation defect, unspecified: Secondary | ICD-10-CM | POA: Diagnosis not present

## 2020-07-22 DIAGNOSIS — D631 Anemia in chronic kidney disease: Secondary | ICD-10-CM | POA: Diagnosis not present

## 2020-07-22 DIAGNOSIS — B351 Tinea unguium: Secondary | ICD-10-CM | POA: Diagnosis not present

## 2020-07-25 DIAGNOSIS — Z992 Dependence on renal dialysis: Secondary | ICD-10-CM | POA: Diagnosis not present

## 2020-07-25 DIAGNOSIS — N186 End stage renal disease: Secondary | ICD-10-CM | POA: Diagnosis not present

## 2020-07-25 DIAGNOSIS — E1129 Type 2 diabetes mellitus with other diabetic kidney complication: Secondary | ICD-10-CM | POA: Diagnosis not present

## 2020-07-25 DIAGNOSIS — N2581 Secondary hyperparathyroidism of renal origin: Secondary | ICD-10-CM | POA: Diagnosis not present

## 2020-07-25 DIAGNOSIS — D689 Coagulation defect, unspecified: Secondary | ICD-10-CM | POA: Diagnosis not present

## 2020-07-25 DIAGNOSIS — D509 Iron deficiency anemia, unspecified: Secondary | ICD-10-CM | POA: Diagnosis not present

## 2020-07-27 DIAGNOSIS — D689 Coagulation defect, unspecified: Secondary | ICD-10-CM | POA: Diagnosis not present

## 2020-07-27 DIAGNOSIS — E1129 Type 2 diabetes mellitus with other diabetic kidney complication: Secondary | ICD-10-CM | POA: Diagnosis not present

## 2020-07-27 DIAGNOSIS — Z992 Dependence on renal dialysis: Secondary | ICD-10-CM | POA: Diagnosis not present

## 2020-07-27 DIAGNOSIS — N186 End stage renal disease: Secondary | ICD-10-CM | POA: Diagnosis not present

## 2020-07-27 DIAGNOSIS — N2581 Secondary hyperparathyroidism of renal origin: Secondary | ICD-10-CM | POA: Diagnosis not present

## 2020-07-27 DIAGNOSIS — D509 Iron deficiency anemia, unspecified: Secondary | ICD-10-CM | POA: Diagnosis not present

## 2020-07-29 DIAGNOSIS — N186 End stage renal disease: Secondary | ICD-10-CM | POA: Diagnosis not present

## 2020-07-29 DIAGNOSIS — N2581 Secondary hyperparathyroidism of renal origin: Secondary | ICD-10-CM | POA: Diagnosis not present

## 2020-07-29 DIAGNOSIS — Z992 Dependence on renal dialysis: Secondary | ICD-10-CM | POA: Diagnosis not present

## 2020-07-29 DIAGNOSIS — E1129 Type 2 diabetes mellitus with other diabetic kidney complication: Secondary | ICD-10-CM | POA: Diagnosis not present

## 2020-07-29 DIAGNOSIS — D689 Coagulation defect, unspecified: Secondary | ICD-10-CM | POA: Diagnosis not present

## 2020-07-29 DIAGNOSIS — D509 Iron deficiency anemia, unspecified: Secondary | ICD-10-CM | POA: Diagnosis not present

## 2020-08-01 DIAGNOSIS — N2581 Secondary hyperparathyroidism of renal origin: Secondary | ICD-10-CM | POA: Diagnosis not present

## 2020-08-01 DIAGNOSIS — N186 End stage renal disease: Secondary | ICD-10-CM | POA: Diagnosis not present

## 2020-08-01 DIAGNOSIS — D509 Iron deficiency anemia, unspecified: Secondary | ICD-10-CM | POA: Diagnosis not present

## 2020-08-01 DIAGNOSIS — D689 Coagulation defect, unspecified: Secondary | ICD-10-CM | POA: Diagnosis not present

## 2020-08-01 DIAGNOSIS — D631 Anemia in chronic kidney disease: Secondary | ICD-10-CM | POA: Diagnosis not present

## 2020-08-01 DIAGNOSIS — Z992 Dependence on renal dialysis: Secondary | ICD-10-CM | POA: Diagnosis not present

## 2020-08-02 DIAGNOSIS — E113393 Type 2 diabetes mellitus with moderate nonproliferative diabetic retinopathy without macular edema, bilateral: Secondary | ICD-10-CM | POA: Diagnosis not present

## 2020-08-02 DIAGNOSIS — H34832 Tributary (branch) retinal vein occlusion, left eye, with macular edema: Secondary | ICD-10-CM | POA: Diagnosis not present

## 2020-08-02 DIAGNOSIS — H43823 Vitreomacular adhesion, bilateral: Secondary | ICD-10-CM | POA: Diagnosis not present

## 2020-08-02 DIAGNOSIS — H35033 Hypertensive retinopathy, bilateral: Secondary | ICD-10-CM | POA: Diagnosis not present

## 2020-08-03 DIAGNOSIS — D509 Iron deficiency anemia, unspecified: Secondary | ICD-10-CM | POA: Diagnosis not present

## 2020-08-03 DIAGNOSIS — N2581 Secondary hyperparathyroidism of renal origin: Secondary | ICD-10-CM | POA: Diagnosis not present

## 2020-08-03 DIAGNOSIS — Z992 Dependence on renal dialysis: Secondary | ICD-10-CM | POA: Diagnosis not present

## 2020-08-03 DIAGNOSIS — D689 Coagulation defect, unspecified: Secondary | ICD-10-CM | POA: Diagnosis not present

## 2020-08-03 DIAGNOSIS — D631 Anemia in chronic kidney disease: Secondary | ICD-10-CM | POA: Diagnosis not present

## 2020-08-03 DIAGNOSIS — N186 End stage renal disease: Secondary | ICD-10-CM | POA: Diagnosis not present

## 2020-08-05 DIAGNOSIS — N2581 Secondary hyperparathyroidism of renal origin: Secondary | ICD-10-CM | POA: Diagnosis not present

## 2020-08-05 DIAGNOSIS — D689 Coagulation defect, unspecified: Secondary | ICD-10-CM | POA: Diagnosis not present

## 2020-08-05 DIAGNOSIS — D509 Iron deficiency anemia, unspecified: Secondary | ICD-10-CM | POA: Diagnosis not present

## 2020-08-05 DIAGNOSIS — Z992 Dependence on renal dialysis: Secondary | ICD-10-CM | POA: Diagnosis not present

## 2020-08-05 DIAGNOSIS — D631 Anemia in chronic kidney disease: Secondary | ICD-10-CM | POA: Diagnosis not present

## 2020-08-05 DIAGNOSIS — N186 End stage renal disease: Secondary | ICD-10-CM | POA: Diagnosis not present

## 2020-08-06 DIAGNOSIS — N186 End stage renal disease: Secondary | ICD-10-CM | POA: Diagnosis not present

## 2020-08-06 DIAGNOSIS — Z992 Dependence on renal dialysis: Secondary | ICD-10-CM | POA: Diagnosis not present

## 2020-08-06 DIAGNOSIS — I129 Hypertensive chronic kidney disease with stage 1 through stage 4 chronic kidney disease, or unspecified chronic kidney disease: Secondary | ICD-10-CM | POA: Diagnosis not present

## 2020-08-08 DIAGNOSIS — D509 Iron deficiency anemia, unspecified: Secondary | ICD-10-CM | POA: Diagnosis not present

## 2020-08-08 DIAGNOSIS — N186 End stage renal disease: Secondary | ICD-10-CM | POA: Diagnosis not present

## 2020-08-08 DIAGNOSIS — D689 Coagulation defect, unspecified: Secondary | ICD-10-CM | POA: Diagnosis not present

## 2020-08-08 DIAGNOSIS — Z992 Dependence on renal dialysis: Secondary | ICD-10-CM | POA: Diagnosis not present

## 2020-08-08 DIAGNOSIS — N2581 Secondary hyperparathyroidism of renal origin: Secondary | ICD-10-CM | POA: Diagnosis not present

## 2020-08-08 DIAGNOSIS — D631 Anemia in chronic kidney disease: Secondary | ICD-10-CM | POA: Diagnosis not present

## 2020-08-10 DIAGNOSIS — D689 Coagulation defect, unspecified: Secondary | ICD-10-CM | POA: Diagnosis not present

## 2020-08-10 DIAGNOSIS — D631 Anemia in chronic kidney disease: Secondary | ICD-10-CM | POA: Diagnosis not present

## 2020-08-10 DIAGNOSIS — D509 Iron deficiency anemia, unspecified: Secondary | ICD-10-CM | POA: Diagnosis not present

## 2020-08-10 DIAGNOSIS — N2581 Secondary hyperparathyroidism of renal origin: Secondary | ICD-10-CM | POA: Diagnosis not present

## 2020-08-10 DIAGNOSIS — N186 End stage renal disease: Secondary | ICD-10-CM | POA: Diagnosis not present

## 2020-08-10 DIAGNOSIS — Z992 Dependence on renal dialysis: Secondary | ICD-10-CM | POA: Diagnosis not present

## 2020-08-12 DIAGNOSIS — Z992 Dependence on renal dialysis: Secondary | ICD-10-CM | POA: Diagnosis not present

## 2020-08-12 DIAGNOSIS — D509 Iron deficiency anemia, unspecified: Secondary | ICD-10-CM | POA: Diagnosis not present

## 2020-08-12 DIAGNOSIS — D631 Anemia in chronic kidney disease: Secondary | ICD-10-CM | POA: Diagnosis not present

## 2020-08-12 DIAGNOSIS — N186 End stage renal disease: Secondary | ICD-10-CM | POA: Diagnosis not present

## 2020-08-12 DIAGNOSIS — N2581 Secondary hyperparathyroidism of renal origin: Secondary | ICD-10-CM | POA: Diagnosis not present

## 2020-08-12 DIAGNOSIS — D689 Coagulation defect, unspecified: Secondary | ICD-10-CM | POA: Diagnosis not present

## 2020-08-15 DIAGNOSIS — N2581 Secondary hyperparathyroidism of renal origin: Secondary | ICD-10-CM | POA: Diagnosis not present

## 2020-08-15 DIAGNOSIS — D509 Iron deficiency anemia, unspecified: Secondary | ICD-10-CM | POA: Diagnosis not present

## 2020-08-15 DIAGNOSIS — Z992 Dependence on renal dialysis: Secondary | ICD-10-CM | POA: Diagnosis not present

## 2020-08-15 DIAGNOSIS — D689 Coagulation defect, unspecified: Secondary | ICD-10-CM | POA: Diagnosis not present

## 2020-08-15 DIAGNOSIS — N186 End stage renal disease: Secondary | ICD-10-CM | POA: Diagnosis not present

## 2020-08-15 DIAGNOSIS — D631 Anemia in chronic kidney disease: Secondary | ICD-10-CM | POA: Diagnosis not present

## 2020-08-17 DIAGNOSIS — N2581 Secondary hyperparathyroidism of renal origin: Secondary | ICD-10-CM | POA: Diagnosis not present

## 2020-08-17 DIAGNOSIS — Z992 Dependence on renal dialysis: Secondary | ICD-10-CM | POA: Diagnosis not present

## 2020-08-17 DIAGNOSIS — D509 Iron deficiency anemia, unspecified: Secondary | ICD-10-CM | POA: Diagnosis not present

## 2020-08-17 DIAGNOSIS — D631 Anemia in chronic kidney disease: Secondary | ICD-10-CM | POA: Diagnosis not present

## 2020-08-17 DIAGNOSIS — N186 End stage renal disease: Secondary | ICD-10-CM | POA: Diagnosis not present

## 2020-08-17 DIAGNOSIS — D689 Coagulation defect, unspecified: Secondary | ICD-10-CM | POA: Diagnosis not present

## 2020-08-19 DIAGNOSIS — D689 Coagulation defect, unspecified: Secondary | ICD-10-CM | POA: Diagnosis not present

## 2020-08-19 DIAGNOSIS — D631 Anemia in chronic kidney disease: Secondary | ICD-10-CM | POA: Diagnosis not present

## 2020-08-19 DIAGNOSIS — Z992 Dependence on renal dialysis: Secondary | ICD-10-CM | POA: Diagnosis not present

## 2020-08-19 DIAGNOSIS — N186 End stage renal disease: Secondary | ICD-10-CM | POA: Diagnosis not present

## 2020-08-19 DIAGNOSIS — D509 Iron deficiency anemia, unspecified: Secondary | ICD-10-CM | POA: Diagnosis not present

## 2020-08-19 DIAGNOSIS — N2581 Secondary hyperparathyroidism of renal origin: Secondary | ICD-10-CM | POA: Diagnosis not present

## 2020-08-22 DIAGNOSIS — Z992 Dependence on renal dialysis: Secondary | ICD-10-CM | POA: Diagnosis not present

## 2020-08-22 DIAGNOSIS — D689 Coagulation defect, unspecified: Secondary | ICD-10-CM | POA: Diagnosis not present

## 2020-08-22 DIAGNOSIS — N2581 Secondary hyperparathyroidism of renal origin: Secondary | ICD-10-CM | POA: Diagnosis not present

## 2020-08-22 DIAGNOSIS — N186 End stage renal disease: Secondary | ICD-10-CM | POA: Diagnosis not present

## 2020-08-22 DIAGNOSIS — D509 Iron deficiency anemia, unspecified: Secondary | ICD-10-CM | POA: Diagnosis not present

## 2020-08-22 DIAGNOSIS — E1129 Type 2 diabetes mellitus with other diabetic kidney complication: Secondary | ICD-10-CM | POA: Diagnosis not present

## 2020-08-24 DIAGNOSIS — Z992 Dependence on renal dialysis: Secondary | ICD-10-CM | POA: Diagnosis not present

## 2020-08-24 DIAGNOSIS — N2581 Secondary hyperparathyroidism of renal origin: Secondary | ICD-10-CM | POA: Diagnosis not present

## 2020-08-24 DIAGNOSIS — N186 End stage renal disease: Secondary | ICD-10-CM | POA: Diagnosis not present

## 2020-08-24 DIAGNOSIS — E1129 Type 2 diabetes mellitus with other diabetic kidney complication: Secondary | ICD-10-CM | POA: Diagnosis not present

## 2020-08-24 DIAGNOSIS — D509 Iron deficiency anemia, unspecified: Secondary | ICD-10-CM | POA: Diagnosis not present

## 2020-08-24 DIAGNOSIS — D689 Coagulation defect, unspecified: Secondary | ICD-10-CM | POA: Diagnosis not present

## 2020-08-26 DIAGNOSIS — Z992 Dependence on renal dialysis: Secondary | ICD-10-CM | POA: Diagnosis not present

## 2020-08-26 DIAGNOSIS — N2581 Secondary hyperparathyroidism of renal origin: Secondary | ICD-10-CM | POA: Diagnosis not present

## 2020-08-26 DIAGNOSIS — E1129 Type 2 diabetes mellitus with other diabetic kidney complication: Secondary | ICD-10-CM | POA: Diagnosis not present

## 2020-08-26 DIAGNOSIS — D689 Coagulation defect, unspecified: Secondary | ICD-10-CM | POA: Diagnosis not present

## 2020-08-26 DIAGNOSIS — N186 End stage renal disease: Secondary | ICD-10-CM | POA: Diagnosis not present

## 2020-08-26 DIAGNOSIS — D509 Iron deficiency anemia, unspecified: Secondary | ICD-10-CM | POA: Diagnosis not present

## 2020-08-29 DIAGNOSIS — N186 End stage renal disease: Secondary | ICD-10-CM | POA: Diagnosis not present

## 2020-08-29 DIAGNOSIS — D509 Iron deficiency anemia, unspecified: Secondary | ICD-10-CM | POA: Diagnosis not present

## 2020-08-29 DIAGNOSIS — D689 Coagulation defect, unspecified: Secondary | ICD-10-CM | POA: Diagnosis not present

## 2020-08-29 DIAGNOSIS — N2581 Secondary hyperparathyroidism of renal origin: Secondary | ICD-10-CM | POA: Diagnosis not present

## 2020-08-29 DIAGNOSIS — Z992 Dependence on renal dialysis: Secondary | ICD-10-CM | POA: Diagnosis not present

## 2020-08-29 DIAGNOSIS — D631 Anemia in chronic kidney disease: Secondary | ICD-10-CM | POA: Diagnosis not present

## 2020-08-31 DIAGNOSIS — N2581 Secondary hyperparathyroidism of renal origin: Secondary | ICD-10-CM | POA: Diagnosis not present

## 2020-08-31 DIAGNOSIS — D509 Iron deficiency anemia, unspecified: Secondary | ICD-10-CM | POA: Diagnosis not present

## 2020-08-31 DIAGNOSIS — D631 Anemia in chronic kidney disease: Secondary | ICD-10-CM | POA: Diagnosis not present

## 2020-08-31 DIAGNOSIS — D689 Coagulation defect, unspecified: Secondary | ICD-10-CM | POA: Diagnosis not present

## 2020-08-31 DIAGNOSIS — N186 End stage renal disease: Secondary | ICD-10-CM | POA: Diagnosis not present

## 2020-08-31 DIAGNOSIS — Z992 Dependence on renal dialysis: Secondary | ICD-10-CM | POA: Diagnosis not present

## 2020-09-02 DIAGNOSIS — D509 Iron deficiency anemia, unspecified: Secondary | ICD-10-CM | POA: Diagnosis not present

## 2020-09-02 DIAGNOSIS — D631 Anemia in chronic kidney disease: Secondary | ICD-10-CM | POA: Diagnosis not present

## 2020-09-02 DIAGNOSIS — Z992 Dependence on renal dialysis: Secondary | ICD-10-CM | POA: Diagnosis not present

## 2020-09-02 DIAGNOSIS — N186 End stage renal disease: Secondary | ICD-10-CM | POA: Diagnosis not present

## 2020-09-02 DIAGNOSIS — D689 Coagulation defect, unspecified: Secondary | ICD-10-CM | POA: Diagnosis not present

## 2020-09-02 DIAGNOSIS — N2581 Secondary hyperparathyroidism of renal origin: Secondary | ICD-10-CM | POA: Diagnosis not present

## 2020-09-05 DIAGNOSIS — N2581 Secondary hyperparathyroidism of renal origin: Secondary | ICD-10-CM | POA: Diagnosis not present

## 2020-09-05 DIAGNOSIS — Z992 Dependence on renal dialysis: Secondary | ICD-10-CM | POA: Diagnosis not present

## 2020-09-05 DIAGNOSIS — D689 Coagulation defect, unspecified: Secondary | ICD-10-CM | POA: Diagnosis not present

## 2020-09-05 DIAGNOSIS — N186 End stage renal disease: Secondary | ICD-10-CM | POA: Diagnosis not present

## 2020-09-06 DIAGNOSIS — N186 End stage renal disease: Secondary | ICD-10-CM | POA: Diagnosis not present

## 2020-09-06 DIAGNOSIS — I129 Hypertensive chronic kidney disease with stage 1 through stage 4 chronic kidney disease, or unspecified chronic kidney disease: Secondary | ICD-10-CM | POA: Diagnosis not present

## 2020-09-06 DIAGNOSIS — Z992 Dependence on renal dialysis: Secondary | ICD-10-CM | POA: Diagnosis not present

## 2020-09-06 DIAGNOSIS — H34832 Tributary (branch) retinal vein occlusion, left eye, with macular edema: Secondary | ICD-10-CM | POA: Diagnosis not present

## 2020-09-06 DIAGNOSIS — H43823 Vitreomacular adhesion, bilateral: Secondary | ICD-10-CM | POA: Diagnosis not present

## 2020-09-07 DIAGNOSIS — N2581 Secondary hyperparathyroidism of renal origin: Secondary | ICD-10-CM | POA: Diagnosis not present

## 2020-09-07 DIAGNOSIS — D689 Coagulation defect, unspecified: Secondary | ICD-10-CM | POA: Diagnosis not present

## 2020-09-07 DIAGNOSIS — D631 Anemia in chronic kidney disease: Secondary | ICD-10-CM | POA: Diagnosis not present

## 2020-09-07 DIAGNOSIS — Z992 Dependence on renal dialysis: Secondary | ICD-10-CM | POA: Diagnosis not present

## 2020-09-07 DIAGNOSIS — N186 End stage renal disease: Secondary | ICD-10-CM | POA: Diagnosis not present

## 2020-09-09 DIAGNOSIS — D631 Anemia in chronic kidney disease: Secondary | ICD-10-CM | POA: Diagnosis not present

## 2020-09-09 DIAGNOSIS — D689 Coagulation defect, unspecified: Secondary | ICD-10-CM | POA: Diagnosis not present

## 2020-09-09 DIAGNOSIS — Z992 Dependence on renal dialysis: Secondary | ICD-10-CM | POA: Diagnosis not present

## 2020-09-09 DIAGNOSIS — N186 End stage renal disease: Secondary | ICD-10-CM | POA: Diagnosis not present

## 2020-09-09 DIAGNOSIS — N2581 Secondary hyperparathyroidism of renal origin: Secondary | ICD-10-CM | POA: Diagnosis not present

## 2020-09-12 DIAGNOSIS — N2581 Secondary hyperparathyroidism of renal origin: Secondary | ICD-10-CM | POA: Diagnosis not present

## 2020-09-12 DIAGNOSIS — Z992 Dependence on renal dialysis: Secondary | ICD-10-CM | POA: Diagnosis not present

## 2020-09-12 DIAGNOSIS — D689 Coagulation defect, unspecified: Secondary | ICD-10-CM | POA: Diagnosis not present

## 2020-09-12 DIAGNOSIS — N186 End stage renal disease: Secondary | ICD-10-CM | POA: Diagnosis not present

## 2020-09-12 DIAGNOSIS — D631 Anemia in chronic kidney disease: Secondary | ICD-10-CM | POA: Diagnosis not present

## 2020-09-14 DIAGNOSIS — D689 Coagulation defect, unspecified: Secondary | ICD-10-CM | POA: Diagnosis not present

## 2020-09-14 DIAGNOSIS — D631 Anemia in chronic kidney disease: Secondary | ICD-10-CM | POA: Diagnosis not present

## 2020-09-14 DIAGNOSIS — N186 End stage renal disease: Secondary | ICD-10-CM | POA: Diagnosis not present

## 2020-09-14 DIAGNOSIS — N2581 Secondary hyperparathyroidism of renal origin: Secondary | ICD-10-CM | POA: Diagnosis not present

## 2020-09-14 DIAGNOSIS — Z992 Dependence on renal dialysis: Secondary | ICD-10-CM | POA: Diagnosis not present

## 2020-09-16 DIAGNOSIS — Z992 Dependence on renal dialysis: Secondary | ICD-10-CM | POA: Diagnosis not present

## 2020-09-16 DIAGNOSIS — D631 Anemia in chronic kidney disease: Secondary | ICD-10-CM | POA: Diagnosis not present

## 2020-09-16 DIAGNOSIS — N2581 Secondary hyperparathyroidism of renal origin: Secondary | ICD-10-CM | POA: Diagnosis not present

## 2020-09-16 DIAGNOSIS — N186 End stage renal disease: Secondary | ICD-10-CM | POA: Diagnosis not present

## 2020-09-16 DIAGNOSIS — D689 Coagulation defect, unspecified: Secondary | ICD-10-CM | POA: Diagnosis not present

## 2020-09-19 DIAGNOSIS — D509 Iron deficiency anemia, unspecified: Secondary | ICD-10-CM | POA: Diagnosis not present

## 2020-09-19 DIAGNOSIS — N2581 Secondary hyperparathyroidism of renal origin: Secondary | ICD-10-CM | POA: Diagnosis not present

## 2020-09-19 DIAGNOSIS — N186 End stage renal disease: Secondary | ICD-10-CM | POA: Diagnosis not present

## 2020-09-19 DIAGNOSIS — Z992 Dependence on renal dialysis: Secondary | ICD-10-CM | POA: Diagnosis not present

## 2020-09-19 DIAGNOSIS — E1129 Type 2 diabetes mellitus with other diabetic kidney complication: Secondary | ICD-10-CM | POA: Diagnosis not present

## 2020-09-19 DIAGNOSIS — D689 Coagulation defect, unspecified: Secondary | ICD-10-CM | POA: Diagnosis not present

## 2020-09-21 DIAGNOSIS — Z992 Dependence on renal dialysis: Secondary | ICD-10-CM | POA: Diagnosis not present

## 2020-09-21 DIAGNOSIS — N2581 Secondary hyperparathyroidism of renal origin: Secondary | ICD-10-CM | POA: Diagnosis not present

## 2020-09-21 DIAGNOSIS — E1129 Type 2 diabetes mellitus with other diabetic kidney complication: Secondary | ICD-10-CM | POA: Diagnosis not present

## 2020-09-21 DIAGNOSIS — N186 End stage renal disease: Secondary | ICD-10-CM | POA: Diagnosis not present

## 2020-09-21 DIAGNOSIS — D509 Iron deficiency anemia, unspecified: Secondary | ICD-10-CM | POA: Diagnosis not present

## 2020-09-21 DIAGNOSIS — D689 Coagulation defect, unspecified: Secondary | ICD-10-CM | POA: Diagnosis not present

## 2020-09-23 DIAGNOSIS — Z992 Dependence on renal dialysis: Secondary | ICD-10-CM | POA: Diagnosis not present

## 2020-09-23 DIAGNOSIS — D689 Coagulation defect, unspecified: Secondary | ICD-10-CM | POA: Diagnosis not present

## 2020-09-23 DIAGNOSIS — D509 Iron deficiency anemia, unspecified: Secondary | ICD-10-CM | POA: Diagnosis not present

## 2020-09-23 DIAGNOSIS — E1129 Type 2 diabetes mellitus with other diabetic kidney complication: Secondary | ICD-10-CM | POA: Diagnosis not present

## 2020-09-23 DIAGNOSIS — N2581 Secondary hyperparathyroidism of renal origin: Secondary | ICD-10-CM | POA: Diagnosis not present

## 2020-09-23 DIAGNOSIS — N186 End stage renal disease: Secondary | ICD-10-CM | POA: Diagnosis not present

## 2020-09-26 DIAGNOSIS — Z992 Dependence on renal dialysis: Secondary | ICD-10-CM | POA: Diagnosis not present

## 2020-09-26 DIAGNOSIS — D689 Coagulation defect, unspecified: Secondary | ICD-10-CM | POA: Diagnosis not present

## 2020-09-26 DIAGNOSIS — N186 End stage renal disease: Secondary | ICD-10-CM | POA: Diagnosis not present

## 2020-09-26 DIAGNOSIS — N2581 Secondary hyperparathyroidism of renal origin: Secondary | ICD-10-CM | POA: Diagnosis not present

## 2020-09-26 DIAGNOSIS — D631 Anemia in chronic kidney disease: Secondary | ICD-10-CM | POA: Diagnosis not present

## 2020-09-28 DIAGNOSIS — Z992 Dependence on renal dialysis: Secondary | ICD-10-CM | POA: Diagnosis not present

## 2020-09-28 DIAGNOSIS — D689 Coagulation defect, unspecified: Secondary | ICD-10-CM | POA: Diagnosis not present

## 2020-09-28 DIAGNOSIS — N2581 Secondary hyperparathyroidism of renal origin: Secondary | ICD-10-CM | POA: Diagnosis not present

## 2020-09-28 DIAGNOSIS — D631 Anemia in chronic kidney disease: Secondary | ICD-10-CM | POA: Diagnosis not present

## 2020-09-28 DIAGNOSIS — N186 End stage renal disease: Secondary | ICD-10-CM | POA: Diagnosis not present

## 2020-09-29 DIAGNOSIS — N2581 Secondary hyperparathyroidism of renal origin: Secondary | ICD-10-CM | POA: Diagnosis not present

## 2020-09-29 DIAGNOSIS — N186 End stage renal disease: Secondary | ICD-10-CM | POA: Diagnosis not present

## 2020-09-29 DIAGNOSIS — D689 Coagulation defect, unspecified: Secondary | ICD-10-CM | POA: Diagnosis not present

## 2020-09-29 DIAGNOSIS — D631 Anemia in chronic kidney disease: Secondary | ICD-10-CM | POA: Diagnosis not present

## 2020-09-29 DIAGNOSIS — Z992 Dependence on renal dialysis: Secondary | ICD-10-CM | POA: Diagnosis not present

## 2020-10-03 DIAGNOSIS — Z992 Dependence on renal dialysis: Secondary | ICD-10-CM | POA: Diagnosis not present

## 2020-10-03 DIAGNOSIS — D689 Coagulation defect, unspecified: Secondary | ICD-10-CM | POA: Diagnosis not present

## 2020-10-03 DIAGNOSIS — N186 End stage renal disease: Secondary | ICD-10-CM | POA: Diagnosis not present

## 2020-10-03 DIAGNOSIS — N2581 Secondary hyperparathyroidism of renal origin: Secondary | ICD-10-CM | POA: Diagnosis not present

## 2020-10-04 DIAGNOSIS — H43823 Vitreomacular adhesion, bilateral: Secondary | ICD-10-CM | POA: Diagnosis not present

## 2020-10-04 DIAGNOSIS — H34832 Tributary (branch) retinal vein occlusion, left eye, with macular edema: Secondary | ICD-10-CM | POA: Diagnosis not present

## 2020-10-04 DIAGNOSIS — E113393 Type 2 diabetes mellitus with moderate nonproliferative diabetic retinopathy without macular edema, bilateral: Secondary | ICD-10-CM | POA: Diagnosis not present

## 2020-10-05 DIAGNOSIS — D689 Coagulation defect, unspecified: Secondary | ICD-10-CM | POA: Diagnosis not present

## 2020-10-05 DIAGNOSIS — Z992 Dependence on renal dialysis: Secondary | ICD-10-CM | POA: Diagnosis not present

## 2020-10-05 DIAGNOSIS — N186 End stage renal disease: Secondary | ICD-10-CM | POA: Diagnosis not present

## 2020-10-05 DIAGNOSIS — N2581 Secondary hyperparathyroidism of renal origin: Secondary | ICD-10-CM | POA: Diagnosis not present

## 2020-10-06 DIAGNOSIS — I129 Hypertensive chronic kidney disease with stage 1 through stage 4 chronic kidney disease, or unspecified chronic kidney disease: Secondary | ICD-10-CM | POA: Diagnosis not present

## 2020-10-06 DIAGNOSIS — N186 End stage renal disease: Secondary | ICD-10-CM | POA: Diagnosis not present

## 2020-10-06 DIAGNOSIS — Z992 Dependence on renal dialysis: Secondary | ICD-10-CM | POA: Diagnosis not present

## 2020-10-07 DIAGNOSIS — Z992 Dependence on renal dialysis: Secondary | ICD-10-CM | POA: Diagnosis not present

## 2020-10-07 DIAGNOSIS — D689 Coagulation defect, unspecified: Secondary | ICD-10-CM | POA: Diagnosis not present

## 2020-10-07 DIAGNOSIS — N2581 Secondary hyperparathyroidism of renal origin: Secondary | ICD-10-CM | POA: Diagnosis not present

## 2020-10-07 DIAGNOSIS — N186 End stage renal disease: Secondary | ICD-10-CM | POA: Diagnosis not present

## 2020-10-10 DIAGNOSIS — D689 Coagulation defect, unspecified: Secondary | ICD-10-CM | POA: Diagnosis not present

## 2020-10-10 DIAGNOSIS — N186 End stage renal disease: Secondary | ICD-10-CM | POA: Diagnosis not present

## 2020-10-10 DIAGNOSIS — Z992 Dependence on renal dialysis: Secondary | ICD-10-CM | POA: Diagnosis not present

## 2020-10-10 DIAGNOSIS — N2581 Secondary hyperparathyroidism of renal origin: Secondary | ICD-10-CM | POA: Diagnosis not present

## 2020-10-12 DIAGNOSIS — D689 Coagulation defect, unspecified: Secondary | ICD-10-CM | POA: Diagnosis not present

## 2020-10-12 DIAGNOSIS — N2581 Secondary hyperparathyroidism of renal origin: Secondary | ICD-10-CM | POA: Diagnosis not present

## 2020-10-12 DIAGNOSIS — Z992 Dependence on renal dialysis: Secondary | ICD-10-CM | POA: Diagnosis not present

## 2020-10-12 DIAGNOSIS — N186 End stage renal disease: Secondary | ICD-10-CM | POA: Diagnosis not present

## 2020-10-14 DIAGNOSIS — N186 End stage renal disease: Secondary | ICD-10-CM | POA: Diagnosis not present

## 2020-10-14 DIAGNOSIS — D689 Coagulation defect, unspecified: Secondary | ICD-10-CM | POA: Diagnosis not present

## 2020-10-14 DIAGNOSIS — Z992 Dependence on renal dialysis: Secondary | ICD-10-CM | POA: Diagnosis not present

## 2020-10-14 DIAGNOSIS — N2581 Secondary hyperparathyroidism of renal origin: Secondary | ICD-10-CM | POA: Diagnosis not present

## 2020-10-17 DIAGNOSIS — Z992 Dependence on renal dialysis: Secondary | ICD-10-CM | POA: Diagnosis not present

## 2020-10-17 DIAGNOSIS — D631 Anemia in chronic kidney disease: Secondary | ICD-10-CM | POA: Diagnosis not present

## 2020-10-17 DIAGNOSIS — D689 Coagulation defect, unspecified: Secondary | ICD-10-CM | POA: Diagnosis not present

## 2020-10-17 DIAGNOSIS — N2581 Secondary hyperparathyroidism of renal origin: Secondary | ICD-10-CM | POA: Diagnosis not present

## 2020-10-17 DIAGNOSIS — N186 End stage renal disease: Secondary | ICD-10-CM | POA: Diagnosis not present

## 2020-10-19 DIAGNOSIS — N186 End stage renal disease: Secondary | ICD-10-CM | POA: Diagnosis not present

## 2020-10-19 DIAGNOSIS — D689 Coagulation defect, unspecified: Secondary | ICD-10-CM | POA: Diagnosis not present

## 2020-10-19 DIAGNOSIS — N2581 Secondary hyperparathyroidism of renal origin: Secondary | ICD-10-CM | POA: Diagnosis not present

## 2020-10-19 DIAGNOSIS — Z992 Dependence on renal dialysis: Secondary | ICD-10-CM | POA: Diagnosis not present

## 2020-10-19 DIAGNOSIS — D631 Anemia in chronic kidney disease: Secondary | ICD-10-CM | POA: Diagnosis not present

## 2020-10-21 DIAGNOSIS — D689 Coagulation defect, unspecified: Secondary | ICD-10-CM | POA: Diagnosis not present

## 2020-10-21 DIAGNOSIS — N2581 Secondary hyperparathyroidism of renal origin: Secondary | ICD-10-CM | POA: Diagnosis not present

## 2020-10-21 DIAGNOSIS — Z992 Dependence on renal dialysis: Secondary | ICD-10-CM | POA: Diagnosis not present

## 2020-10-21 DIAGNOSIS — D631 Anemia in chronic kidney disease: Secondary | ICD-10-CM | POA: Diagnosis not present

## 2020-10-21 DIAGNOSIS — N186 End stage renal disease: Secondary | ICD-10-CM | POA: Diagnosis not present

## 2020-10-26 DIAGNOSIS — N186 End stage renal disease: Secondary | ICD-10-CM | POA: Diagnosis not present

## 2020-10-26 DIAGNOSIS — E1129 Type 2 diabetes mellitus with other diabetic kidney complication: Secondary | ICD-10-CM | POA: Diagnosis not present

## 2020-10-26 DIAGNOSIS — D509 Iron deficiency anemia, unspecified: Secondary | ICD-10-CM | POA: Diagnosis not present

## 2020-10-26 DIAGNOSIS — N2581 Secondary hyperparathyroidism of renal origin: Secondary | ICD-10-CM | POA: Diagnosis not present

## 2020-10-26 DIAGNOSIS — D689 Coagulation defect, unspecified: Secondary | ICD-10-CM | POA: Diagnosis not present

## 2020-10-26 DIAGNOSIS — Z992 Dependence on renal dialysis: Secondary | ICD-10-CM | POA: Diagnosis not present

## 2020-10-28 DIAGNOSIS — D509 Iron deficiency anemia, unspecified: Secondary | ICD-10-CM | POA: Diagnosis not present

## 2020-10-28 DIAGNOSIS — Z992 Dependence on renal dialysis: Secondary | ICD-10-CM | POA: Diagnosis not present

## 2020-10-28 DIAGNOSIS — N2581 Secondary hyperparathyroidism of renal origin: Secondary | ICD-10-CM | POA: Diagnosis not present

## 2020-10-28 DIAGNOSIS — N186 End stage renal disease: Secondary | ICD-10-CM | POA: Diagnosis not present

## 2020-10-28 DIAGNOSIS — D689 Coagulation defect, unspecified: Secondary | ICD-10-CM | POA: Diagnosis not present

## 2020-10-28 DIAGNOSIS — E1129 Type 2 diabetes mellitus with other diabetic kidney complication: Secondary | ICD-10-CM | POA: Diagnosis not present

## 2020-10-31 DIAGNOSIS — Z992 Dependence on renal dialysis: Secondary | ICD-10-CM | POA: Diagnosis not present

## 2020-10-31 DIAGNOSIS — N186 End stage renal disease: Secondary | ICD-10-CM | POA: Diagnosis not present

## 2020-10-31 DIAGNOSIS — D689 Coagulation defect, unspecified: Secondary | ICD-10-CM | POA: Diagnosis not present

## 2020-10-31 DIAGNOSIS — D631 Anemia in chronic kidney disease: Secondary | ICD-10-CM | POA: Diagnosis not present

## 2020-10-31 DIAGNOSIS — N2581 Secondary hyperparathyroidism of renal origin: Secondary | ICD-10-CM | POA: Diagnosis not present

## 2020-11-02 DIAGNOSIS — Z992 Dependence on renal dialysis: Secondary | ICD-10-CM | POA: Diagnosis not present

## 2020-11-02 DIAGNOSIS — D631 Anemia in chronic kidney disease: Secondary | ICD-10-CM | POA: Diagnosis not present

## 2020-11-02 DIAGNOSIS — D689 Coagulation defect, unspecified: Secondary | ICD-10-CM | POA: Diagnosis not present

## 2020-11-02 DIAGNOSIS — N186 End stage renal disease: Secondary | ICD-10-CM | POA: Diagnosis not present

## 2020-11-02 DIAGNOSIS — N2581 Secondary hyperparathyroidism of renal origin: Secondary | ICD-10-CM | POA: Diagnosis not present

## 2020-11-04 DIAGNOSIS — Z992 Dependence on renal dialysis: Secondary | ICD-10-CM | POA: Diagnosis not present

## 2020-11-04 DIAGNOSIS — N186 End stage renal disease: Secondary | ICD-10-CM | POA: Diagnosis not present

## 2020-11-04 DIAGNOSIS — D631 Anemia in chronic kidney disease: Secondary | ICD-10-CM | POA: Diagnosis not present

## 2020-11-04 DIAGNOSIS — D689 Coagulation defect, unspecified: Secondary | ICD-10-CM | POA: Diagnosis not present

## 2020-11-04 DIAGNOSIS — N2581 Secondary hyperparathyroidism of renal origin: Secondary | ICD-10-CM | POA: Diagnosis not present

## 2020-11-06 DIAGNOSIS — N186 End stage renal disease: Secondary | ICD-10-CM | POA: Diagnosis not present

## 2020-11-06 DIAGNOSIS — Z992 Dependence on renal dialysis: Secondary | ICD-10-CM | POA: Diagnosis not present

## 2020-11-06 DIAGNOSIS — I129 Hypertensive chronic kidney disease with stage 1 through stage 4 chronic kidney disease, or unspecified chronic kidney disease: Secondary | ICD-10-CM | POA: Diagnosis not present

## 2020-11-07 DIAGNOSIS — R52 Pain, unspecified: Secondary | ICD-10-CM | POA: Diagnosis not present

## 2020-11-07 DIAGNOSIS — D631 Anemia in chronic kidney disease: Secondary | ICD-10-CM | POA: Diagnosis not present

## 2020-11-07 DIAGNOSIS — D509 Iron deficiency anemia, unspecified: Secondary | ICD-10-CM | POA: Diagnosis not present

## 2020-11-07 DIAGNOSIS — N186 End stage renal disease: Secondary | ICD-10-CM | POA: Diagnosis not present

## 2020-11-07 DIAGNOSIS — D689 Coagulation defect, unspecified: Secondary | ICD-10-CM | POA: Diagnosis not present

## 2020-11-07 DIAGNOSIS — N2581 Secondary hyperparathyroidism of renal origin: Secondary | ICD-10-CM | POA: Diagnosis not present

## 2020-11-07 DIAGNOSIS — Z992 Dependence on renal dialysis: Secondary | ICD-10-CM | POA: Diagnosis not present

## 2020-11-09 DIAGNOSIS — R52 Pain, unspecified: Secondary | ICD-10-CM | POA: Diagnosis not present

## 2020-11-09 DIAGNOSIS — N186 End stage renal disease: Secondary | ICD-10-CM | POA: Diagnosis not present

## 2020-11-09 DIAGNOSIS — D631 Anemia in chronic kidney disease: Secondary | ICD-10-CM | POA: Diagnosis not present

## 2020-11-09 DIAGNOSIS — D509 Iron deficiency anemia, unspecified: Secondary | ICD-10-CM | POA: Diagnosis not present

## 2020-11-09 DIAGNOSIS — D689 Coagulation defect, unspecified: Secondary | ICD-10-CM | POA: Diagnosis not present

## 2020-11-09 DIAGNOSIS — Z992 Dependence on renal dialysis: Secondary | ICD-10-CM | POA: Diagnosis not present

## 2020-11-09 DIAGNOSIS — N2581 Secondary hyperparathyroidism of renal origin: Secondary | ICD-10-CM | POA: Diagnosis not present

## 2020-11-11 DIAGNOSIS — N2581 Secondary hyperparathyroidism of renal origin: Secondary | ICD-10-CM | POA: Diagnosis not present

## 2020-11-11 DIAGNOSIS — R52 Pain, unspecified: Secondary | ICD-10-CM | POA: Diagnosis not present

## 2020-11-11 DIAGNOSIS — Z992 Dependence on renal dialysis: Secondary | ICD-10-CM | POA: Diagnosis not present

## 2020-11-11 DIAGNOSIS — D631 Anemia in chronic kidney disease: Secondary | ICD-10-CM | POA: Diagnosis not present

## 2020-11-11 DIAGNOSIS — N186 End stage renal disease: Secondary | ICD-10-CM | POA: Diagnosis not present

## 2020-11-11 DIAGNOSIS — D689 Coagulation defect, unspecified: Secondary | ICD-10-CM | POA: Diagnosis not present

## 2020-11-11 DIAGNOSIS — D509 Iron deficiency anemia, unspecified: Secondary | ICD-10-CM | POA: Diagnosis not present

## 2020-11-14 DIAGNOSIS — D631 Anemia in chronic kidney disease: Secondary | ICD-10-CM | POA: Diagnosis not present

## 2020-11-14 DIAGNOSIS — D509 Iron deficiency anemia, unspecified: Secondary | ICD-10-CM | POA: Diagnosis not present

## 2020-11-14 DIAGNOSIS — N186 End stage renal disease: Secondary | ICD-10-CM | POA: Diagnosis not present

## 2020-11-14 DIAGNOSIS — R52 Pain, unspecified: Secondary | ICD-10-CM | POA: Diagnosis not present

## 2020-11-14 DIAGNOSIS — Z992 Dependence on renal dialysis: Secondary | ICD-10-CM | POA: Diagnosis not present

## 2020-11-14 DIAGNOSIS — N2581 Secondary hyperparathyroidism of renal origin: Secondary | ICD-10-CM | POA: Diagnosis not present

## 2020-11-14 DIAGNOSIS — D689 Coagulation defect, unspecified: Secondary | ICD-10-CM | POA: Diagnosis not present

## 2020-11-16 DIAGNOSIS — R52 Pain, unspecified: Secondary | ICD-10-CM | POA: Diagnosis not present

## 2020-11-16 DIAGNOSIS — N2581 Secondary hyperparathyroidism of renal origin: Secondary | ICD-10-CM | POA: Diagnosis not present

## 2020-11-16 DIAGNOSIS — Z992 Dependence on renal dialysis: Secondary | ICD-10-CM | POA: Diagnosis not present

## 2020-11-16 DIAGNOSIS — D689 Coagulation defect, unspecified: Secondary | ICD-10-CM | POA: Diagnosis not present

## 2020-11-16 DIAGNOSIS — D631 Anemia in chronic kidney disease: Secondary | ICD-10-CM | POA: Diagnosis not present

## 2020-11-16 DIAGNOSIS — N186 End stage renal disease: Secondary | ICD-10-CM | POA: Diagnosis not present

## 2020-11-16 DIAGNOSIS — D509 Iron deficiency anemia, unspecified: Secondary | ICD-10-CM | POA: Diagnosis not present

## 2020-11-18 DIAGNOSIS — D509 Iron deficiency anemia, unspecified: Secondary | ICD-10-CM | POA: Diagnosis not present

## 2020-11-18 DIAGNOSIS — R52 Pain, unspecified: Secondary | ICD-10-CM | POA: Diagnosis not present

## 2020-11-18 DIAGNOSIS — N186 End stage renal disease: Secondary | ICD-10-CM | POA: Diagnosis not present

## 2020-11-18 DIAGNOSIS — D689 Coagulation defect, unspecified: Secondary | ICD-10-CM | POA: Diagnosis not present

## 2020-11-18 DIAGNOSIS — Z992 Dependence on renal dialysis: Secondary | ICD-10-CM | POA: Diagnosis not present

## 2020-11-18 DIAGNOSIS — D631 Anemia in chronic kidney disease: Secondary | ICD-10-CM | POA: Diagnosis not present

## 2020-11-18 DIAGNOSIS — N2581 Secondary hyperparathyroidism of renal origin: Secondary | ICD-10-CM | POA: Diagnosis not present

## 2020-11-21 DIAGNOSIS — N2581 Secondary hyperparathyroidism of renal origin: Secondary | ICD-10-CM | POA: Diagnosis not present

## 2020-11-21 DIAGNOSIS — D509 Iron deficiency anemia, unspecified: Secondary | ICD-10-CM | POA: Diagnosis not present

## 2020-11-21 DIAGNOSIS — D689 Coagulation defect, unspecified: Secondary | ICD-10-CM | POA: Diagnosis not present

## 2020-11-21 DIAGNOSIS — Z992 Dependence on renal dialysis: Secondary | ICD-10-CM | POA: Diagnosis not present

## 2020-11-21 DIAGNOSIS — N186 End stage renal disease: Secondary | ICD-10-CM | POA: Diagnosis not present

## 2020-11-21 DIAGNOSIS — R52 Pain, unspecified: Secondary | ICD-10-CM | POA: Diagnosis not present

## 2020-11-21 DIAGNOSIS — D631 Anemia in chronic kidney disease: Secondary | ICD-10-CM | POA: Diagnosis not present

## 2020-11-22 DIAGNOSIS — B353 Tinea pedis: Secondary | ICD-10-CM | POA: Insufficient documentation

## 2020-11-22 DIAGNOSIS — I129 Hypertensive chronic kidney disease with stage 1 through stage 4 chronic kidney disease, or unspecified chronic kidney disease: Secondary | ICD-10-CM | POA: Diagnosis not present

## 2020-11-22 DIAGNOSIS — M545 Low back pain, unspecified: Secondary | ICD-10-CM | POA: Diagnosis not present

## 2020-11-22 DIAGNOSIS — E1122 Type 2 diabetes mellitus with diabetic chronic kidney disease: Secondary | ICD-10-CM | POA: Diagnosis not present

## 2020-11-22 DIAGNOSIS — E1142 Type 2 diabetes mellitus with diabetic polyneuropathy: Secondary | ICD-10-CM | POA: Diagnosis not present

## 2020-11-23 DIAGNOSIS — N2581 Secondary hyperparathyroidism of renal origin: Secondary | ICD-10-CM | POA: Diagnosis not present

## 2020-11-23 DIAGNOSIS — D689 Coagulation defect, unspecified: Secondary | ICD-10-CM | POA: Diagnosis not present

## 2020-11-23 DIAGNOSIS — D631 Anemia in chronic kidney disease: Secondary | ICD-10-CM | POA: Diagnosis not present

## 2020-11-23 DIAGNOSIS — R52 Pain, unspecified: Secondary | ICD-10-CM | POA: Diagnosis not present

## 2020-11-23 DIAGNOSIS — D509 Iron deficiency anemia, unspecified: Secondary | ICD-10-CM | POA: Diagnosis not present

## 2020-11-23 DIAGNOSIS — Z992 Dependence on renal dialysis: Secondary | ICD-10-CM | POA: Diagnosis not present

## 2020-11-23 DIAGNOSIS — N186 End stage renal disease: Secondary | ICD-10-CM | POA: Diagnosis not present

## 2020-11-25 DIAGNOSIS — Z992 Dependence on renal dialysis: Secondary | ICD-10-CM | POA: Diagnosis not present

## 2020-11-25 DIAGNOSIS — D509 Iron deficiency anemia, unspecified: Secondary | ICD-10-CM | POA: Diagnosis not present

## 2020-11-25 DIAGNOSIS — N2581 Secondary hyperparathyroidism of renal origin: Secondary | ICD-10-CM | POA: Diagnosis not present

## 2020-11-25 DIAGNOSIS — D631 Anemia in chronic kidney disease: Secondary | ICD-10-CM | POA: Diagnosis not present

## 2020-11-25 DIAGNOSIS — D689 Coagulation defect, unspecified: Secondary | ICD-10-CM | POA: Diagnosis not present

## 2020-11-25 DIAGNOSIS — R52 Pain, unspecified: Secondary | ICD-10-CM | POA: Diagnosis not present

## 2020-11-25 DIAGNOSIS — N186 End stage renal disease: Secondary | ICD-10-CM | POA: Diagnosis not present

## 2020-11-27 IMAGING — CT CT HEAD W/O CM
4 series · 16 of 47 positions shown, 18 images · non-contrast
Comparison: CT head 04/04/2017

CLINICAL DATA: Weakness for 2 weeks, high blood pressure the

EXAM:
CT HEAD WITHOUT CONTRAST
TECHNIQUE: Contiguous axial images were obtained from the base of the skull
through the vertex without intravenous contrast.

[Series 3: head wo · axial · 0.49mm/px · z∈[-138,-18]mm · 7 of 34 slices shown, 9 images]
[im 5/34  brain]
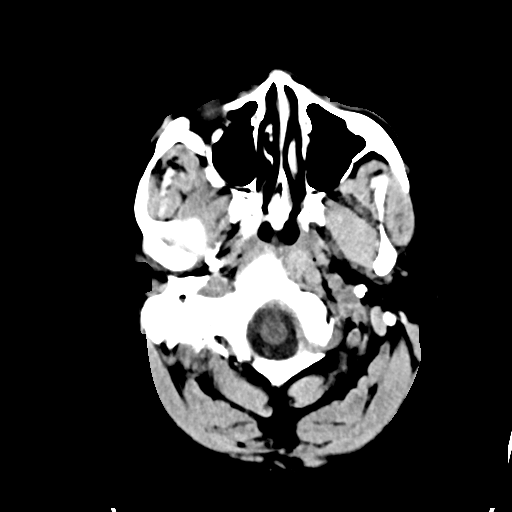
[im 5/34  bone]
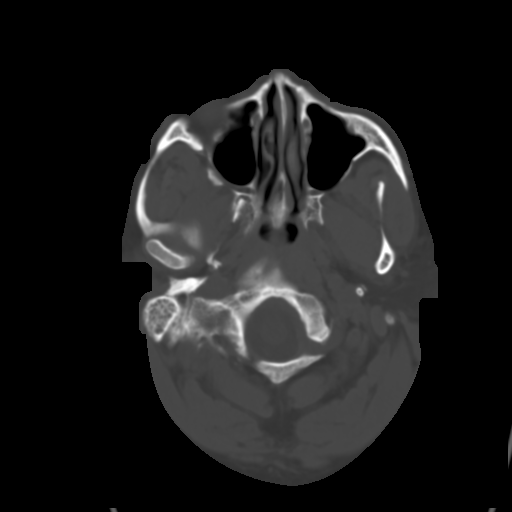
[im 9/34  brain]
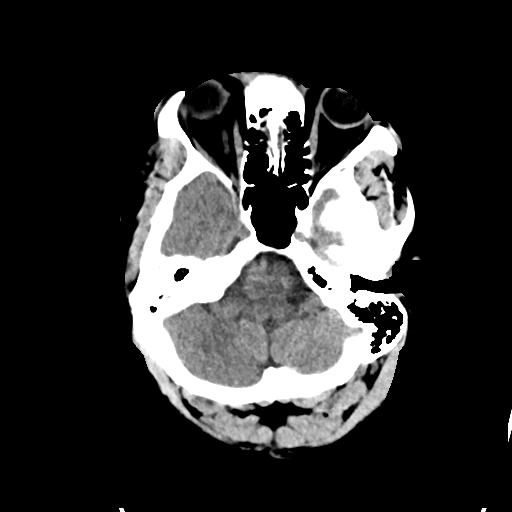
[im 13/34  brain]
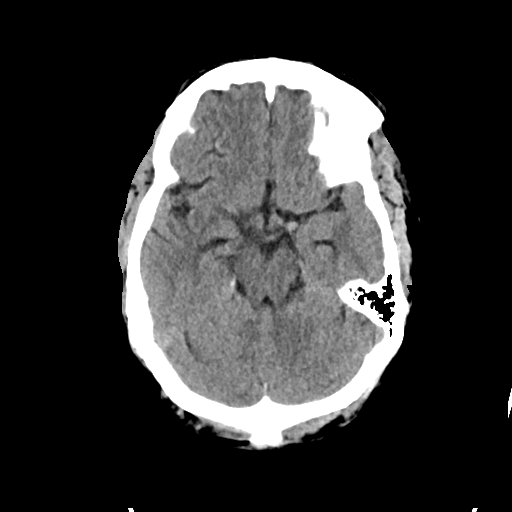
[im 17/34  brain]
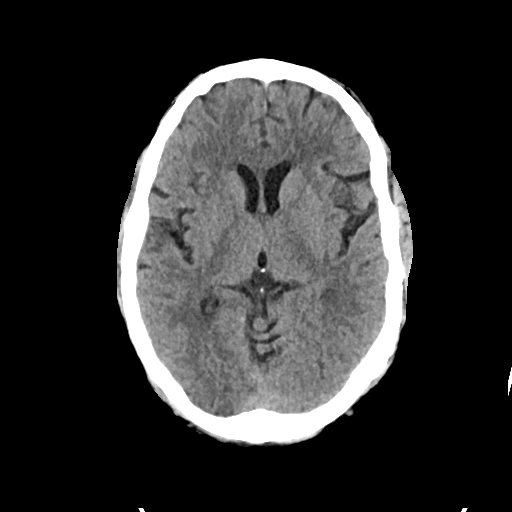
[im 21/34  brain]
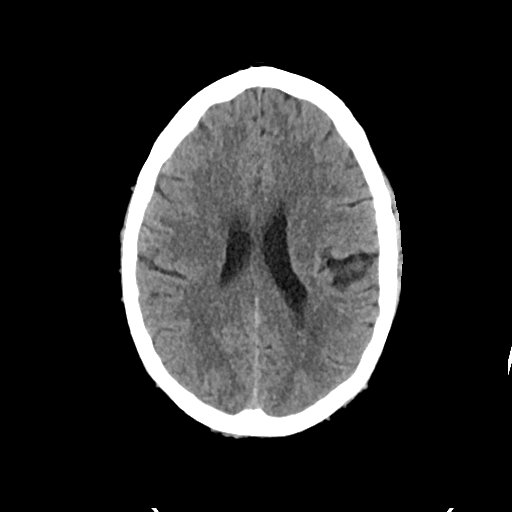
[im 21/34  bone]
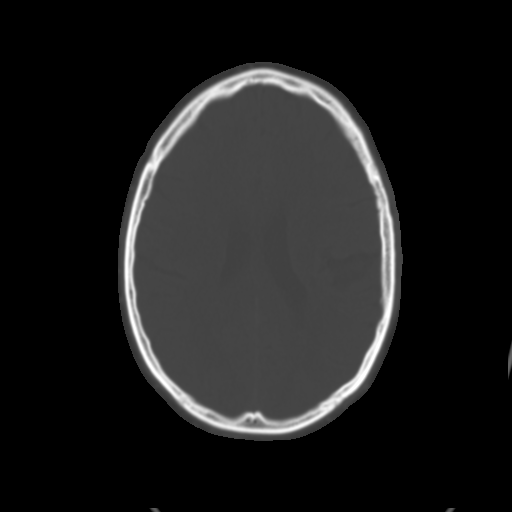
[im 25/34  brain]
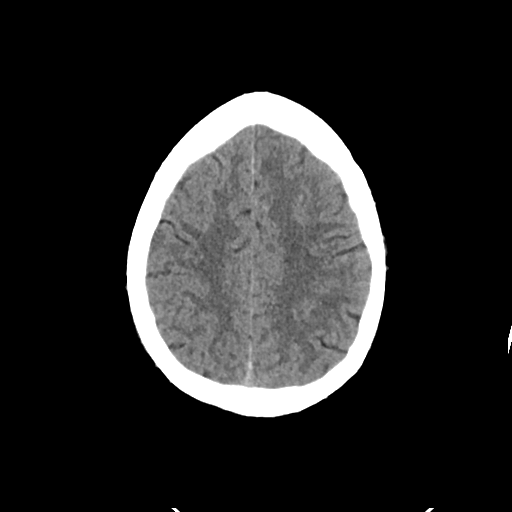
[im 29/34  brain]
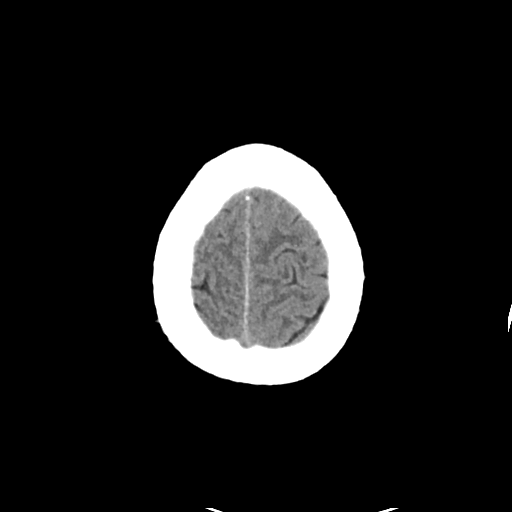

[Series 4: head bone · axial · 0.49mm/px · z∈[-142,-110]mm · 3 of 84 slices shown]
[im 9/84  bone]
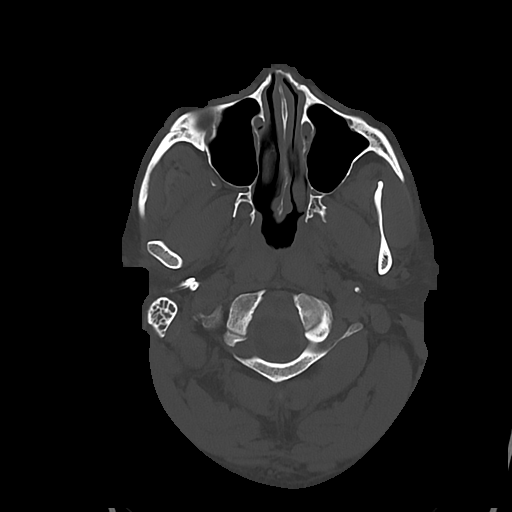
[im 17/84  bone]
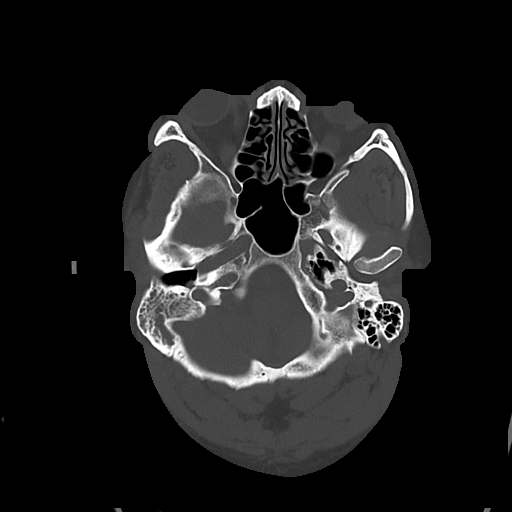
[im 25/84  bone]
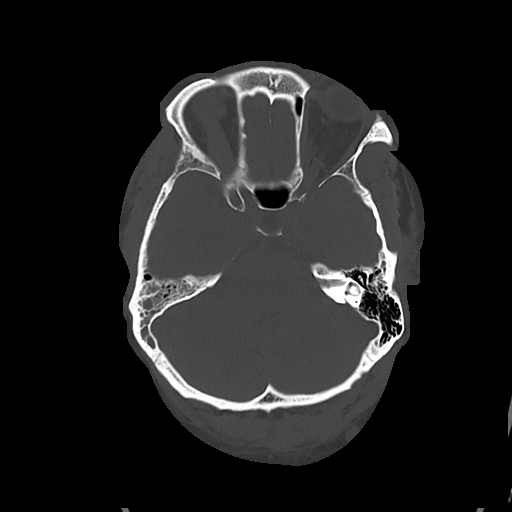

[Series 5: cor soft · coronal · 0.37mm/px · 3 of 95 slices shown]
[im 32/95  brain]
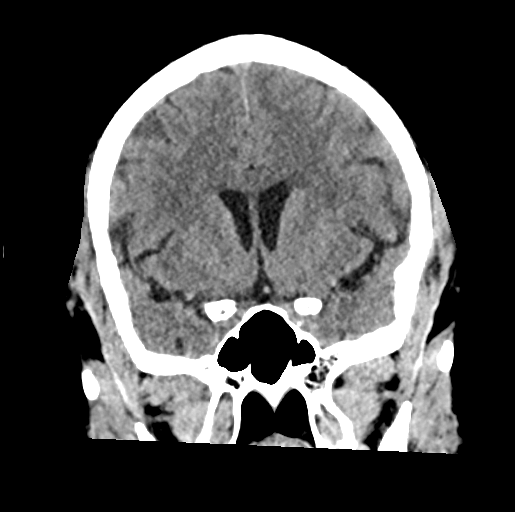
[im 42/95  brain]
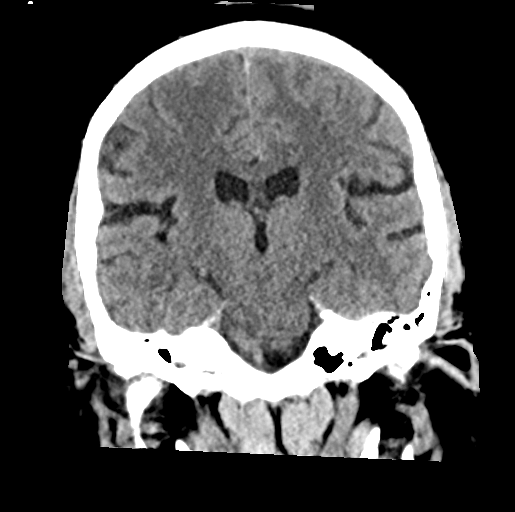
[im 53/95  brain]
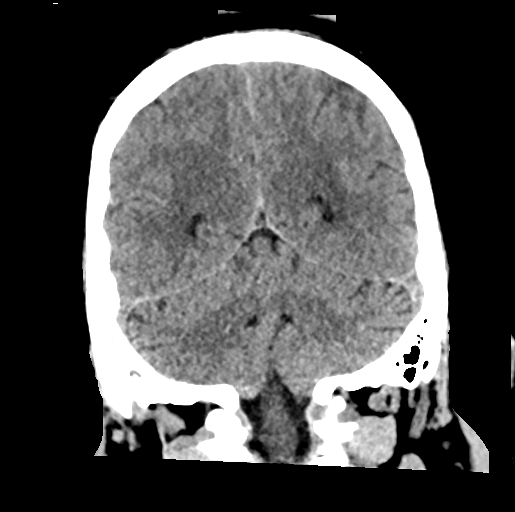

[Series 6: sag soft · sagittal · 0.37mm/px · 3 of 64 slices shown]
[im 22/64  brain]
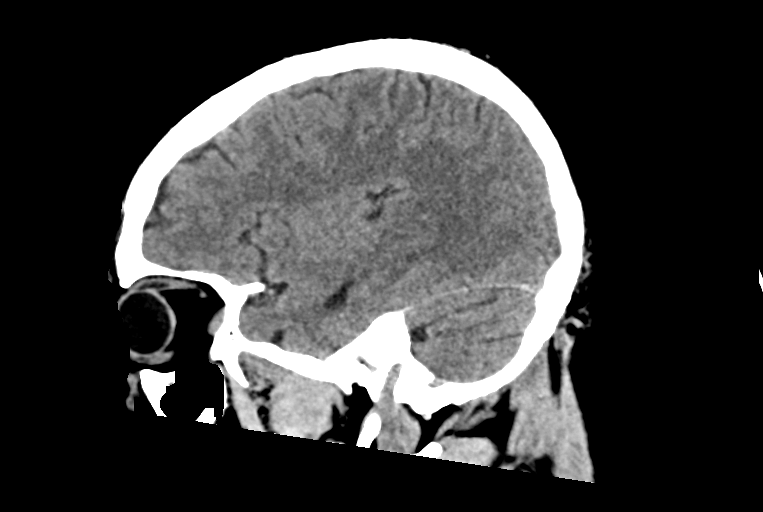
[im 32/64  brain]
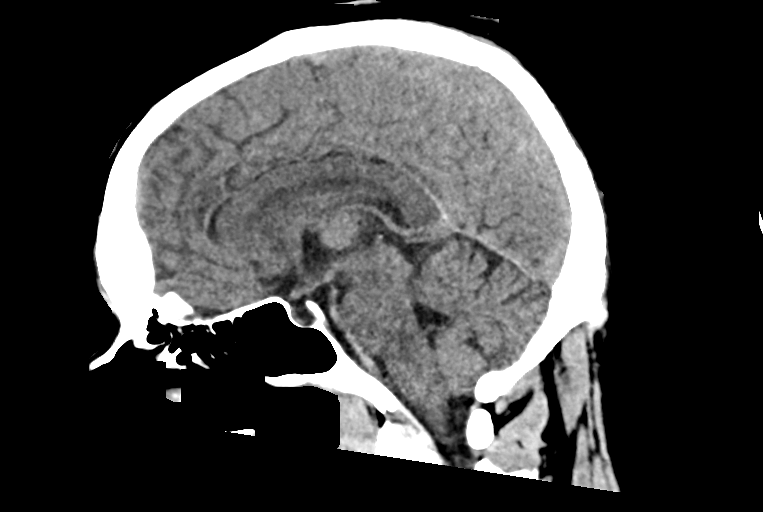
[im 43/64  brain]
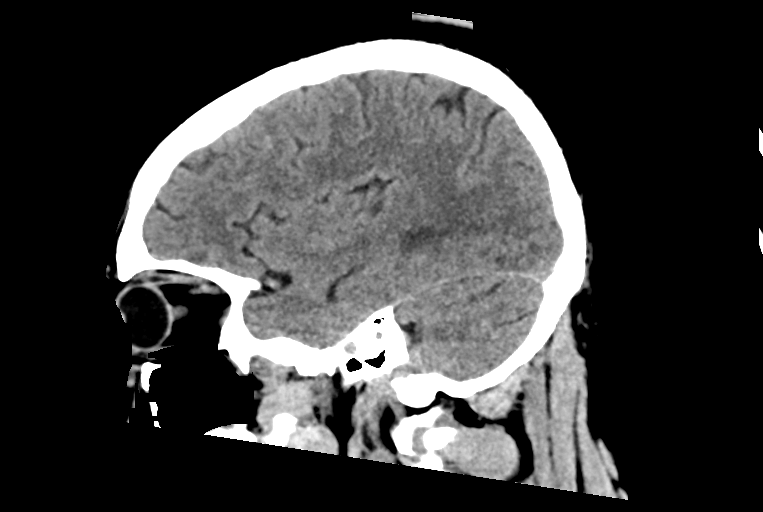

[16 of 47 positions shown; findings below may reference images not displayed]

FINDINGS: Brain: No evidence of acute infarction, hemorrhage, hydrocephalus,
extra-axial collection or mass lesion/mass effect. Symmetric
prominence of the ventricles, cisterns and sulci compatible with
mild senescent parenchymal volume loss. Patchy areas of white matter
hypoattenuation are most compatible with chronic microvascular
angiopathy.

Vascular: Atherosclerotic calcification of the carotid siphons. No
hyperdense vessel.

Skull: No calvarial fracture or suspicious osseous lesion. No scalp
swelling or hematoma.

Sinuses/Orbits: Minimal thickening in the maxillary and ethmoid
sinuses. Right concha bullosa. Right mastoid and middle ear effusion
is noted. Left mastoid air cells are well aerated. Pneumatization of
the petrous apices including opacified right petrous apex. Included
orbital structures are unremarkable.

Other: None
IMPRESSION: 1. No acute intracranial abnormality.
2. Mild senescent parenchymal volume loss and chronic microvascular
angiopathy.
3. Right mastoid and middle ear effusion including opacification of
the pneumatized right petrous apex. Correlate for clinical features
of otomastoiditis.

## 2020-11-28 DIAGNOSIS — D631 Anemia in chronic kidney disease: Secondary | ICD-10-CM | POA: Diagnosis not present

## 2020-11-28 DIAGNOSIS — N2581 Secondary hyperparathyroidism of renal origin: Secondary | ICD-10-CM | POA: Diagnosis not present

## 2020-11-28 DIAGNOSIS — Z992 Dependence on renal dialysis: Secondary | ICD-10-CM | POA: Diagnosis not present

## 2020-11-28 DIAGNOSIS — R52 Pain, unspecified: Secondary | ICD-10-CM | POA: Diagnosis not present

## 2020-11-28 DIAGNOSIS — D509 Iron deficiency anemia, unspecified: Secondary | ICD-10-CM | POA: Diagnosis not present

## 2020-11-28 DIAGNOSIS — D689 Coagulation defect, unspecified: Secondary | ICD-10-CM | POA: Diagnosis not present

## 2020-11-28 DIAGNOSIS — N186 End stage renal disease: Secondary | ICD-10-CM | POA: Diagnosis not present

## 2020-11-29 DIAGNOSIS — E1122 Type 2 diabetes mellitus with diabetic chronic kidney disease: Secondary | ICD-10-CM | POA: Diagnosis not present

## 2020-11-29 DIAGNOSIS — R9439 Abnormal result of other cardiovascular function study: Secondary | ICD-10-CM | POA: Diagnosis not present

## 2020-11-29 DIAGNOSIS — I4891 Unspecified atrial fibrillation: Secondary | ICD-10-CM | POA: Diagnosis not present

## 2020-11-29 DIAGNOSIS — Z7901 Long term (current) use of anticoagulants: Secondary | ICD-10-CM | POA: Diagnosis not present

## 2020-11-30 DIAGNOSIS — D509 Iron deficiency anemia, unspecified: Secondary | ICD-10-CM | POA: Diagnosis not present

## 2020-11-30 DIAGNOSIS — D689 Coagulation defect, unspecified: Secondary | ICD-10-CM | POA: Diagnosis not present

## 2020-11-30 DIAGNOSIS — N186 End stage renal disease: Secondary | ICD-10-CM | POA: Diagnosis not present

## 2020-11-30 DIAGNOSIS — Z992 Dependence on renal dialysis: Secondary | ICD-10-CM | POA: Diagnosis not present

## 2020-11-30 DIAGNOSIS — N2581 Secondary hyperparathyroidism of renal origin: Secondary | ICD-10-CM | POA: Diagnosis not present

## 2020-11-30 DIAGNOSIS — D631 Anemia in chronic kidney disease: Secondary | ICD-10-CM | POA: Diagnosis not present

## 2020-11-30 DIAGNOSIS — R52 Pain, unspecified: Secondary | ICD-10-CM | POA: Diagnosis not present

## 2020-12-02 DIAGNOSIS — N186 End stage renal disease: Secondary | ICD-10-CM | POA: Diagnosis not present

## 2020-12-02 DIAGNOSIS — N2581 Secondary hyperparathyroidism of renal origin: Secondary | ICD-10-CM | POA: Diagnosis not present

## 2020-12-02 DIAGNOSIS — D509 Iron deficiency anemia, unspecified: Secondary | ICD-10-CM | POA: Diagnosis not present

## 2020-12-02 DIAGNOSIS — Z992 Dependence on renal dialysis: Secondary | ICD-10-CM | POA: Diagnosis not present

## 2020-12-02 DIAGNOSIS — D631 Anemia in chronic kidney disease: Secondary | ICD-10-CM | POA: Diagnosis not present

## 2020-12-02 DIAGNOSIS — R52 Pain, unspecified: Secondary | ICD-10-CM | POA: Diagnosis not present

## 2020-12-02 DIAGNOSIS — D689 Coagulation defect, unspecified: Secondary | ICD-10-CM | POA: Diagnosis not present

## 2020-12-05 DIAGNOSIS — D631 Anemia in chronic kidney disease: Secondary | ICD-10-CM | POA: Diagnosis not present

## 2020-12-05 DIAGNOSIS — D509 Iron deficiency anemia, unspecified: Secondary | ICD-10-CM | POA: Diagnosis not present

## 2020-12-05 DIAGNOSIS — D689 Coagulation defect, unspecified: Secondary | ICD-10-CM | POA: Diagnosis not present

## 2020-12-05 DIAGNOSIS — N2581 Secondary hyperparathyroidism of renal origin: Secondary | ICD-10-CM | POA: Diagnosis not present

## 2020-12-05 DIAGNOSIS — R52 Pain, unspecified: Secondary | ICD-10-CM | POA: Diagnosis not present

## 2020-12-05 DIAGNOSIS — Z992 Dependence on renal dialysis: Secondary | ICD-10-CM | POA: Diagnosis not present

## 2020-12-05 DIAGNOSIS — N186 End stage renal disease: Secondary | ICD-10-CM | POA: Diagnosis not present

## 2020-12-07 DIAGNOSIS — E1122 Type 2 diabetes mellitus with diabetic chronic kidney disease: Secondary | ICD-10-CM | POA: Diagnosis not present

## 2020-12-07 DIAGNOSIS — I129 Hypertensive chronic kidney disease with stage 1 through stage 4 chronic kidney disease, or unspecified chronic kidney disease: Secondary | ICD-10-CM | POA: Diagnosis not present

## 2020-12-07 DIAGNOSIS — I132 Hypertensive heart and chronic kidney disease with heart failure and with stage 5 chronic kidney disease, or end stage renal disease: Secondary | ICD-10-CM | POA: Diagnosis not present

## 2020-12-07 DIAGNOSIS — N186 End stage renal disease: Secondary | ICD-10-CM | POA: Diagnosis not present

## 2020-12-07 DIAGNOSIS — I5032 Chronic diastolic (congestive) heart failure: Secondary | ICD-10-CM | POA: Diagnosis not present

## 2020-12-07 DIAGNOSIS — Z794 Long term (current) use of insulin: Secondary | ICD-10-CM | POA: Diagnosis not present

## 2020-12-07 DIAGNOSIS — Z992 Dependence on renal dialysis: Secondary | ICD-10-CM | POA: Diagnosis not present

## 2020-12-07 DIAGNOSIS — R9439 Abnormal result of other cardiovascular function study: Secondary | ICD-10-CM | POA: Diagnosis not present

## 2020-12-15 DIAGNOSIS — S86009A Unspecified injury of unspecified Achilles tendon, initial encounter: Secondary | ICD-10-CM | POA: Diagnosis not present

## 2020-12-19 DIAGNOSIS — N2581 Secondary hyperparathyroidism of renal origin: Secondary | ICD-10-CM | POA: Diagnosis not present

## 2020-12-19 DIAGNOSIS — Z992 Dependence on renal dialysis: Secondary | ICD-10-CM | POA: Diagnosis not present

## 2020-12-19 DIAGNOSIS — D631 Anemia in chronic kidney disease: Secondary | ICD-10-CM | POA: Diagnosis not present

## 2020-12-19 DIAGNOSIS — N186 End stage renal disease: Secondary | ICD-10-CM | POA: Diagnosis not present

## 2020-12-19 DIAGNOSIS — D689 Coagulation defect, unspecified: Secondary | ICD-10-CM | POA: Diagnosis not present

## 2020-12-21 DIAGNOSIS — D689 Coagulation defect, unspecified: Secondary | ICD-10-CM | POA: Diagnosis not present

## 2020-12-21 DIAGNOSIS — N186 End stage renal disease: Secondary | ICD-10-CM | POA: Diagnosis not present

## 2020-12-21 DIAGNOSIS — D631 Anemia in chronic kidney disease: Secondary | ICD-10-CM | POA: Diagnosis not present

## 2020-12-21 DIAGNOSIS — Z992 Dependence on renal dialysis: Secondary | ICD-10-CM | POA: Diagnosis not present

## 2020-12-21 DIAGNOSIS — N2581 Secondary hyperparathyroidism of renal origin: Secondary | ICD-10-CM | POA: Diagnosis not present

## 2020-12-23 DIAGNOSIS — D631 Anemia in chronic kidney disease: Secondary | ICD-10-CM | POA: Diagnosis not present

## 2020-12-23 DIAGNOSIS — N2581 Secondary hyperparathyroidism of renal origin: Secondary | ICD-10-CM | POA: Diagnosis not present

## 2020-12-23 DIAGNOSIS — D689 Coagulation defect, unspecified: Secondary | ICD-10-CM | POA: Diagnosis not present

## 2020-12-23 DIAGNOSIS — Z992 Dependence on renal dialysis: Secondary | ICD-10-CM | POA: Diagnosis not present

## 2020-12-23 DIAGNOSIS — N186 End stage renal disease: Secondary | ICD-10-CM | POA: Diagnosis not present

## 2020-12-26 DIAGNOSIS — Z992 Dependence on renal dialysis: Secondary | ICD-10-CM | POA: Diagnosis not present

## 2020-12-26 DIAGNOSIS — N186 End stage renal disease: Secondary | ICD-10-CM | POA: Diagnosis not present

## 2020-12-26 DIAGNOSIS — N2581 Secondary hyperparathyroidism of renal origin: Secondary | ICD-10-CM | POA: Diagnosis not present

## 2020-12-26 DIAGNOSIS — D689 Coagulation defect, unspecified: Secondary | ICD-10-CM | POA: Diagnosis not present

## 2020-12-26 DIAGNOSIS — D509 Iron deficiency anemia, unspecified: Secondary | ICD-10-CM | POA: Diagnosis not present

## 2020-12-28 DIAGNOSIS — D689 Coagulation defect, unspecified: Secondary | ICD-10-CM | POA: Diagnosis not present

## 2020-12-28 DIAGNOSIS — N186 End stage renal disease: Secondary | ICD-10-CM | POA: Diagnosis not present

## 2020-12-28 DIAGNOSIS — Z992 Dependence on renal dialysis: Secondary | ICD-10-CM | POA: Diagnosis not present

## 2020-12-28 DIAGNOSIS — D509 Iron deficiency anemia, unspecified: Secondary | ICD-10-CM | POA: Diagnosis not present

## 2020-12-28 DIAGNOSIS — N2581 Secondary hyperparathyroidism of renal origin: Secondary | ICD-10-CM | POA: Diagnosis not present

## 2020-12-29 DIAGNOSIS — Z992 Dependence on renal dialysis: Secondary | ICD-10-CM | POA: Diagnosis not present

## 2020-12-29 DIAGNOSIS — N186 End stage renal disease: Secondary | ICD-10-CM | POA: Diagnosis not present

## 2020-12-29 DIAGNOSIS — D509 Iron deficiency anemia, unspecified: Secondary | ICD-10-CM | POA: Diagnosis not present

## 2020-12-29 DIAGNOSIS — N2581 Secondary hyperparathyroidism of renal origin: Secondary | ICD-10-CM | POA: Diagnosis not present

## 2020-12-29 DIAGNOSIS — D689 Coagulation defect, unspecified: Secondary | ICD-10-CM | POA: Diagnosis not present

## 2021-01-02 DIAGNOSIS — D689 Coagulation defect, unspecified: Secondary | ICD-10-CM | POA: Diagnosis not present

## 2021-01-02 DIAGNOSIS — D631 Anemia in chronic kidney disease: Secondary | ICD-10-CM | POA: Diagnosis not present

## 2021-01-02 DIAGNOSIS — N186 End stage renal disease: Secondary | ICD-10-CM | POA: Diagnosis not present

## 2021-01-02 DIAGNOSIS — N2581 Secondary hyperparathyroidism of renal origin: Secondary | ICD-10-CM | POA: Diagnosis not present

## 2021-01-02 DIAGNOSIS — D509 Iron deficiency anemia, unspecified: Secondary | ICD-10-CM | POA: Diagnosis not present

## 2021-01-02 DIAGNOSIS — Z992 Dependence on renal dialysis: Secondary | ICD-10-CM | POA: Diagnosis not present

## 2021-01-04 DIAGNOSIS — Z992 Dependence on renal dialysis: Secondary | ICD-10-CM | POA: Diagnosis not present

## 2021-01-04 DIAGNOSIS — N186 End stage renal disease: Secondary | ICD-10-CM | POA: Diagnosis not present

## 2021-01-04 DIAGNOSIS — D631 Anemia in chronic kidney disease: Secondary | ICD-10-CM | POA: Diagnosis not present

## 2021-01-04 DIAGNOSIS — D509 Iron deficiency anemia, unspecified: Secondary | ICD-10-CM | POA: Diagnosis not present

## 2021-01-04 DIAGNOSIS — D689 Coagulation defect, unspecified: Secondary | ICD-10-CM | POA: Diagnosis not present

## 2021-01-04 DIAGNOSIS — N2581 Secondary hyperparathyroidism of renal origin: Secondary | ICD-10-CM | POA: Diagnosis not present

## 2021-01-05 DIAGNOSIS — R52 Pain, unspecified: Secondary | ICD-10-CM | POA: Diagnosis not present

## 2021-01-05 DIAGNOSIS — S86012D Strain of left Achilles tendon, subsequent encounter: Secondary | ICD-10-CM | POA: Diagnosis not present

## 2021-01-06 DIAGNOSIS — I129 Hypertensive chronic kidney disease with stage 1 through stage 4 chronic kidney disease, or unspecified chronic kidney disease: Secondary | ICD-10-CM | POA: Diagnosis not present

## 2021-01-06 DIAGNOSIS — N2581 Secondary hyperparathyroidism of renal origin: Secondary | ICD-10-CM | POA: Diagnosis not present

## 2021-01-06 DIAGNOSIS — D631 Anemia in chronic kidney disease: Secondary | ICD-10-CM | POA: Diagnosis not present

## 2021-01-06 DIAGNOSIS — D509 Iron deficiency anemia, unspecified: Secondary | ICD-10-CM | POA: Diagnosis not present

## 2021-01-06 DIAGNOSIS — N186 End stage renal disease: Secondary | ICD-10-CM | POA: Diagnosis not present

## 2021-01-06 DIAGNOSIS — D689 Coagulation defect, unspecified: Secondary | ICD-10-CM | POA: Diagnosis not present

## 2021-01-06 DIAGNOSIS — Z992 Dependence on renal dialysis: Secondary | ICD-10-CM | POA: Diagnosis not present

## 2021-01-16 DIAGNOSIS — D631 Anemia in chronic kidney disease: Secondary | ICD-10-CM | POA: Diagnosis not present

## 2021-01-16 DIAGNOSIS — N186 End stage renal disease: Secondary | ICD-10-CM | POA: Diagnosis not present

## 2021-01-16 DIAGNOSIS — Z992 Dependence on renal dialysis: Secondary | ICD-10-CM | POA: Diagnosis not present

## 2021-01-16 DIAGNOSIS — D509 Iron deficiency anemia, unspecified: Secondary | ICD-10-CM | POA: Diagnosis not present

## 2021-01-16 DIAGNOSIS — D689 Coagulation defect, unspecified: Secondary | ICD-10-CM | POA: Diagnosis not present

## 2021-01-16 DIAGNOSIS — N2581 Secondary hyperparathyroidism of renal origin: Secondary | ICD-10-CM | POA: Diagnosis not present

## 2021-01-18 DIAGNOSIS — D631 Anemia in chronic kidney disease: Secondary | ICD-10-CM | POA: Diagnosis not present

## 2021-01-18 DIAGNOSIS — Z992 Dependence on renal dialysis: Secondary | ICD-10-CM | POA: Diagnosis not present

## 2021-01-18 DIAGNOSIS — D509 Iron deficiency anemia, unspecified: Secondary | ICD-10-CM | POA: Diagnosis not present

## 2021-01-18 DIAGNOSIS — D689 Coagulation defect, unspecified: Secondary | ICD-10-CM | POA: Diagnosis not present

## 2021-01-18 DIAGNOSIS — N186 End stage renal disease: Secondary | ICD-10-CM | POA: Diagnosis not present

## 2021-01-18 DIAGNOSIS — N2581 Secondary hyperparathyroidism of renal origin: Secondary | ICD-10-CM | POA: Diagnosis not present

## 2021-01-20 DIAGNOSIS — D509 Iron deficiency anemia, unspecified: Secondary | ICD-10-CM | POA: Diagnosis not present

## 2021-01-20 DIAGNOSIS — D689 Coagulation defect, unspecified: Secondary | ICD-10-CM | POA: Diagnosis not present

## 2021-01-20 DIAGNOSIS — N186 End stage renal disease: Secondary | ICD-10-CM | POA: Diagnosis not present

## 2021-01-20 DIAGNOSIS — N2581 Secondary hyperparathyroidism of renal origin: Secondary | ICD-10-CM | POA: Diagnosis not present

## 2021-01-20 DIAGNOSIS — D631 Anemia in chronic kidney disease: Secondary | ICD-10-CM | POA: Diagnosis not present

## 2021-01-20 DIAGNOSIS — Z992 Dependence on renal dialysis: Secondary | ICD-10-CM | POA: Diagnosis not present

## 2021-01-23 DIAGNOSIS — D689 Coagulation defect, unspecified: Secondary | ICD-10-CM | POA: Diagnosis not present

## 2021-01-23 DIAGNOSIS — D509 Iron deficiency anemia, unspecified: Secondary | ICD-10-CM | POA: Diagnosis not present

## 2021-01-23 DIAGNOSIS — Z992 Dependence on renal dialysis: Secondary | ICD-10-CM | POA: Diagnosis not present

## 2021-01-23 DIAGNOSIS — N186 End stage renal disease: Secondary | ICD-10-CM | POA: Diagnosis not present

## 2021-01-23 DIAGNOSIS — N2581 Secondary hyperparathyroidism of renal origin: Secondary | ICD-10-CM | POA: Diagnosis not present

## 2021-01-25 DIAGNOSIS — N2581 Secondary hyperparathyroidism of renal origin: Secondary | ICD-10-CM | POA: Diagnosis not present

## 2021-01-25 DIAGNOSIS — N186 End stage renal disease: Secondary | ICD-10-CM | POA: Diagnosis not present

## 2021-01-25 DIAGNOSIS — D689 Coagulation defect, unspecified: Secondary | ICD-10-CM | POA: Diagnosis not present

## 2021-01-25 DIAGNOSIS — Z992 Dependence on renal dialysis: Secondary | ICD-10-CM | POA: Diagnosis not present

## 2021-01-25 DIAGNOSIS — D509 Iron deficiency anemia, unspecified: Secondary | ICD-10-CM | POA: Diagnosis not present

## 2021-01-27 DIAGNOSIS — D509 Iron deficiency anemia, unspecified: Secondary | ICD-10-CM | POA: Diagnosis not present

## 2021-01-27 DIAGNOSIS — Z992 Dependence on renal dialysis: Secondary | ICD-10-CM | POA: Diagnosis not present

## 2021-01-27 DIAGNOSIS — D689 Coagulation defect, unspecified: Secondary | ICD-10-CM | POA: Diagnosis not present

## 2021-01-27 DIAGNOSIS — N186 End stage renal disease: Secondary | ICD-10-CM | POA: Diagnosis not present

## 2021-01-27 DIAGNOSIS — N2581 Secondary hyperparathyroidism of renal origin: Secondary | ICD-10-CM | POA: Diagnosis not present

## 2021-01-30 DIAGNOSIS — N186 End stage renal disease: Secondary | ICD-10-CM | POA: Diagnosis not present

## 2021-01-30 DIAGNOSIS — D509 Iron deficiency anemia, unspecified: Secondary | ICD-10-CM | POA: Diagnosis not present

## 2021-01-30 DIAGNOSIS — Z992 Dependence on renal dialysis: Secondary | ICD-10-CM | POA: Diagnosis not present

## 2021-01-30 DIAGNOSIS — D631 Anemia in chronic kidney disease: Secondary | ICD-10-CM | POA: Diagnosis not present

## 2021-01-30 DIAGNOSIS — D689 Coagulation defect, unspecified: Secondary | ICD-10-CM | POA: Diagnosis not present

## 2021-01-30 DIAGNOSIS — N2581 Secondary hyperparathyroidism of renal origin: Secondary | ICD-10-CM | POA: Diagnosis not present

## 2021-02-01 DIAGNOSIS — D631 Anemia in chronic kidney disease: Secondary | ICD-10-CM | POA: Diagnosis not present

## 2021-02-01 DIAGNOSIS — D509 Iron deficiency anemia, unspecified: Secondary | ICD-10-CM | POA: Diagnosis not present

## 2021-02-01 DIAGNOSIS — N186 End stage renal disease: Secondary | ICD-10-CM | POA: Diagnosis not present

## 2021-02-01 DIAGNOSIS — N2581 Secondary hyperparathyroidism of renal origin: Secondary | ICD-10-CM | POA: Diagnosis not present

## 2021-02-01 DIAGNOSIS — D689 Coagulation defect, unspecified: Secondary | ICD-10-CM | POA: Diagnosis not present

## 2021-02-01 DIAGNOSIS — Z992 Dependence on renal dialysis: Secondary | ICD-10-CM | POA: Diagnosis not present

## 2021-02-02 DIAGNOSIS — S86012D Strain of left Achilles tendon, subsequent encounter: Secondary | ICD-10-CM | POA: Diagnosis not present

## 2021-02-03 DIAGNOSIS — N186 End stage renal disease: Secondary | ICD-10-CM | POA: Diagnosis not present

## 2021-02-03 DIAGNOSIS — D631 Anemia in chronic kidney disease: Secondary | ICD-10-CM | POA: Diagnosis not present

## 2021-02-03 DIAGNOSIS — N2581 Secondary hyperparathyroidism of renal origin: Secondary | ICD-10-CM | POA: Diagnosis not present

## 2021-02-03 DIAGNOSIS — D509 Iron deficiency anemia, unspecified: Secondary | ICD-10-CM | POA: Diagnosis not present

## 2021-02-03 DIAGNOSIS — D689 Coagulation defect, unspecified: Secondary | ICD-10-CM | POA: Diagnosis not present

## 2021-02-03 DIAGNOSIS — Z992 Dependence on renal dialysis: Secondary | ICD-10-CM | POA: Diagnosis not present

## 2021-02-06 DIAGNOSIS — N2581 Secondary hyperparathyroidism of renal origin: Secondary | ICD-10-CM | POA: Diagnosis not present

## 2021-02-06 DIAGNOSIS — I129 Hypertensive chronic kidney disease with stage 1 through stage 4 chronic kidney disease, or unspecified chronic kidney disease: Secondary | ICD-10-CM | POA: Diagnosis not present

## 2021-02-06 DIAGNOSIS — Z992 Dependence on renal dialysis: Secondary | ICD-10-CM | POA: Diagnosis not present

## 2021-02-06 DIAGNOSIS — D689 Coagulation defect, unspecified: Secondary | ICD-10-CM | POA: Diagnosis not present

## 2021-02-06 DIAGNOSIS — N186 End stage renal disease: Secondary | ICD-10-CM | POA: Diagnosis not present

## 2021-02-06 DIAGNOSIS — D509 Iron deficiency anemia, unspecified: Secondary | ICD-10-CM | POA: Diagnosis not present

## 2021-02-08 DIAGNOSIS — N2581 Secondary hyperparathyroidism of renal origin: Secondary | ICD-10-CM | POA: Diagnosis not present

## 2021-02-08 DIAGNOSIS — D631 Anemia in chronic kidney disease: Secondary | ICD-10-CM | POA: Diagnosis not present

## 2021-02-08 DIAGNOSIS — N186 End stage renal disease: Secondary | ICD-10-CM | POA: Diagnosis not present

## 2021-02-08 DIAGNOSIS — Z992 Dependence on renal dialysis: Secondary | ICD-10-CM | POA: Diagnosis not present

## 2021-02-08 DIAGNOSIS — D509 Iron deficiency anemia, unspecified: Secondary | ICD-10-CM | POA: Diagnosis not present

## 2021-02-08 DIAGNOSIS — D689 Coagulation defect, unspecified: Secondary | ICD-10-CM | POA: Diagnosis not present

## 2021-02-10 DIAGNOSIS — D689 Coagulation defect, unspecified: Secondary | ICD-10-CM | POA: Diagnosis not present

## 2021-02-10 DIAGNOSIS — N2581 Secondary hyperparathyroidism of renal origin: Secondary | ICD-10-CM | POA: Diagnosis not present

## 2021-02-10 DIAGNOSIS — D631 Anemia in chronic kidney disease: Secondary | ICD-10-CM | POA: Diagnosis not present

## 2021-02-10 DIAGNOSIS — D509 Iron deficiency anemia, unspecified: Secondary | ICD-10-CM | POA: Diagnosis not present

## 2021-02-10 DIAGNOSIS — N186 End stage renal disease: Secondary | ICD-10-CM | POA: Diagnosis not present

## 2021-02-10 DIAGNOSIS — Z992 Dependence on renal dialysis: Secondary | ICD-10-CM | POA: Diagnosis not present

## 2021-02-13 DIAGNOSIS — N2581 Secondary hyperparathyroidism of renal origin: Secondary | ICD-10-CM | POA: Diagnosis not present

## 2021-02-13 DIAGNOSIS — D689 Coagulation defect, unspecified: Secondary | ICD-10-CM | POA: Diagnosis not present

## 2021-02-13 DIAGNOSIS — N186 End stage renal disease: Secondary | ICD-10-CM | POA: Diagnosis not present

## 2021-02-13 DIAGNOSIS — Z992 Dependence on renal dialysis: Secondary | ICD-10-CM | POA: Diagnosis not present

## 2021-02-13 DIAGNOSIS — D631 Anemia in chronic kidney disease: Secondary | ICD-10-CM | POA: Diagnosis not present

## 2021-02-13 DIAGNOSIS — D509 Iron deficiency anemia, unspecified: Secondary | ICD-10-CM | POA: Diagnosis not present

## 2021-02-15 DIAGNOSIS — Z992 Dependence on renal dialysis: Secondary | ICD-10-CM | POA: Diagnosis not present

## 2021-02-15 DIAGNOSIS — N2581 Secondary hyperparathyroidism of renal origin: Secondary | ICD-10-CM | POA: Diagnosis not present

## 2021-02-15 DIAGNOSIS — N186 End stage renal disease: Secondary | ICD-10-CM | POA: Diagnosis not present

## 2021-02-15 DIAGNOSIS — D689 Coagulation defect, unspecified: Secondary | ICD-10-CM | POA: Diagnosis not present

## 2021-02-15 DIAGNOSIS — D509 Iron deficiency anemia, unspecified: Secondary | ICD-10-CM | POA: Diagnosis not present

## 2021-02-15 DIAGNOSIS — D631 Anemia in chronic kidney disease: Secondary | ICD-10-CM | POA: Diagnosis not present

## 2021-02-17 DIAGNOSIS — N2581 Secondary hyperparathyroidism of renal origin: Secondary | ICD-10-CM | POA: Diagnosis not present

## 2021-02-17 DIAGNOSIS — Z992 Dependence on renal dialysis: Secondary | ICD-10-CM | POA: Diagnosis not present

## 2021-02-17 DIAGNOSIS — D631 Anemia in chronic kidney disease: Secondary | ICD-10-CM | POA: Diagnosis not present

## 2021-02-17 DIAGNOSIS — D509 Iron deficiency anemia, unspecified: Secondary | ICD-10-CM | POA: Diagnosis not present

## 2021-02-17 DIAGNOSIS — D689 Coagulation defect, unspecified: Secondary | ICD-10-CM | POA: Diagnosis not present

## 2021-02-17 DIAGNOSIS — N186 End stage renal disease: Secondary | ICD-10-CM | POA: Diagnosis not present

## 2021-02-20 DIAGNOSIS — B351 Tinea unguium: Secondary | ICD-10-CM | POA: Diagnosis not present

## 2021-02-20 DIAGNOSIS — N186 End stage renal disease: Secondary | ICD-10-CM | POA: Diagnosis not present

## 2021-02-20 DIAGNOSIS — D689 Coagulation defect, unspecified: Secondary | ICD-10-CM | POA: Diagnosis not present

## 2021-02-20 DIAGNOSIS — Z992 Dependence on renal dialysis: Secondary | ICD-10-CM | POA: Diagnosis not present

## 2021-02-20 DIAGNOSIS — Z794 Long term (current) use of insulin: Secondary | ICD-10-CM | POA: Diagnosis not present

## 2021-02-20 DIAGNOSIS — E1142 Type 2 diabetes mellitus with diabetic polyneuropathy: Secondary | ICD-10-CM | POA: Diagnosis not present

## 2021-02-20 DIAGNOSIS — D509 Iron deficiency anemia, unspecified: Secondary | ICD-10-CM | POA: Diagnosis not present

## 2021-02-20 DIAGNOSIS — N2581 Secondary hyperparathyroidism of renal origin: Secondary | ICD-10-CM | POA: Diagnosis not present

## 2021-02-22 DIAGNOSIS — N186 End stage renal disease: Secondary | ICD-10-CM | POA: Diagnosis not present

## 2021-02-22 DIAGNOSIS — Z992 Dependence on renal dialysis: Secondary | ICD-10-CM | POA: Diagnosis not present

## 2021-02-22 DIAGNOSIS — D689 Coagulation defect, unspecified: Secondary | ICD-10-CM | POA: Diagnosis not present

## 2021-02-22 DIAGNOSIS — D509 Iron deficiency anemia, unspecified: Secondary | ICD-10-CM | POA: Diagnosis not present

## 2021-02-22 DIAGNOSIS — N2581 Secondary hyperparathyroidism of renal origin: Secondary | ICD-10-CM | POA: Diagnosis not present

## 2021-02-24 DIAGNOSIS — Z992 Dependence on renal dialysis: Secondary | ICD-10-CM | POA: Diagnosis not present

## 2021-02-24 DIAGNOSIS — D689 Coagulation defect, unspecified: Secondary | ICD-10-CM | POA: Diagnosis not present

## 2021-02-24 DIAGNOSIS — D509 Iron deficiency anemia, unspecified: Secondary | ICD-10-CM | POA: Diagnosis not present

## 2021-02-24 DIAGNOSIS — N186 End stage renal disease: Secondary | ICD-10-CM | POA: Diagnosis not present

## 2021-02-24 DIAGNOSIS — N2581 Secondary hyperparathyroidism of renal origin: Secondary | ICD-10-CM | POA: Diagnosis not present

## 2021-02-26 DIAGNOSIS — D509 Iron deficiency anemia, unspecified: Secondary | ICD-10-CM | POA: Diagnosis not present

## 2021-02-26 DIAGNOSIS — D689 Coagulation defect, unspecified: Secondary | ICD-10-CM | POA: Diagnosis not present

## 2021-02-26 DIAGNOSIS — Z992 Dependence on renal dialysis: Secondary | ICD-10-CM | POA: Diagnosis not present

## 2021-02-26 DIAGNOSIS — N186 End stage renal disease: Secondary | ICD-10-CM | POA: Diagnosis not present

## 2021-02-26 DIAGNOSIS — N2581 Secondary hyperparathyroidism of renal origin: Secondary | ICD-10-CM | POA: Diagnosis not present

## 2021-02-26 DIAGNOSIS — D631 Anemia in chronic kidney disease: Secondary | ICD-10-CM | POA: Diagnosis not present

## 2021-02-28 DIAGNOSIS — N186 End stage renal disease: Secondary | ICD-10-CM | POA: Diagnosis not present

## 2021-02-28 DIAGNOSIS — D509 Iron deficiency anemia, unspecified: Secondary | ICD-10-CM | POA: Diagnosis not present

## 2021-02-28 DIAGNOSIS — N2581 Secondary hyperparathyroidism of renal origin: Secondary | ICD-10-CM | POA: Diagnosis not present

## 2021-02-28 DIAGNOSIS — D631 Anemia in chronic kidney disease: Secondary | ICD-10-CM | POA: Diagnosis not present

## 2021-02-28 DIAGNOSIS — D689 Coagulation defect, unspecified: Secondary | ICD-10-CM | POA: Diagnosis not present

## 2021-02-28 DIAGNOSIS — Z992 Dependence on renal dialysis: Secondary | ICD-10-CM | POA: Diagnosis not present

## 2021-03-06 DIAGNOSIS — N2581 Secondary hyperparathyroidism of renal origin: Secondary | ICD-10-CM | POA: Diagnosis not present

## 2021-03-06 DIAGNOSIS — D631 Anemia in chronic kidney disease: Secondary | ICD-10-CM | POA: Diagnosis not present

## 2021-03-06 DIAGNOSIS — D509 Iron deficiency anemia, unspecified: Secondary | ICD-10-CM | POA: Diagnosis not present

## 2021-03-06 DIAGNOSIS — N186 End stage renal disease: Secondary | ICD-10-CM | POA: Diagnosis not present

## 2021-03-06 DIAGNOSIS — D689 Coagulation defect, unspecified: Secondary | ICD-10-CM | POA: Diagnosis not present

## 2021-03-06 DIAGNOSIS — Z992 Dependence on renal dialysis: Secondary | ICD-10-CM | POA: Diagnosis not present

## 2021-03-08 DIAGNOSIS — I129 Hypertensive chronic kidney disease with stage 1 through stage 4 chronic kidney disease, or unspecified chronic kidney disease: Secondary | ICD-10-CM | POA: Diagnosis not present

## 2021-03-08 DIAGNOSIS — N186 End stage renal disease: Secondary | ICD-10-CM | POA: Diagnosis not present

## 2021-03-08 DIAGNOSIS — N2581 Secondary hyperparathyroidism of renal origin: Secondary | ICD-10-CM | POA: Diagnosis not present

## 2021-03-08 DIAGNOSIS — D689 Coagulation defect, unspecified: Secondary | ICD-10-CM | POA: Diagnosis not present

## 2021-03-08 DIAGNOSIS — Z992 Dependence on renal dialysis: Secondary | ICD-10-CM | POA: Diagnosis not present

## 2021-03-08 DIAGNOSIS — D631 Anemia in chronic kidney disease: Secondary | ICD-10-CM | POA: Diagnosis not present

## 2021-03-08 DIAGNOSIS — D509 Iron deficiency anemia, unspecified: Secondary | ICD-10-CM | POA: Diagnosis not present

## 2021-03-10 DIAGNOSIS — U071 COVID-19: Secondary | ICD-10-CM | POA: Diagnosis not present

## 2021-03-13 DIAGNOSIS — D631 Anemia in chronic kidney disease: Secondary | ICD-10-CM | POA: Diagnosis not present

## 2021-03-13 DIAGNOSIS — N186 End stage renal disease: Secondary | ICD-10-CM | POA: Diagnosis not present

## 2021-03-13 DIAGNOSIS — D689 Coagulation defect, unspecified: Secondary | ICD-10-CM | POA: Diagnosis not present

## 2021-03-13 DIAGNOSIS — N2581 Secondary hyperparathyroidism of renal origin: Secondary | ICD-10-CM | POA: Diagnosis not present

## 2021-03-13 DIAGNOSIS — Z992 Dependence on renal dialysis: Secondary | ICD-10-CM | POA: Diagnosis not present

## 2021-03-13 DIAGNOSIS — D509 Iron deficiency anemia, unspecified: Secondary | ICD-10-CM | POA: Diagnosis not present

## 2021-03-14 ENCOUNTER — Ambulatory Visit (HOSPITAL_BASED_OUTPATIENT_CLINIC_OR_DEPARTMENT_OTHER): Payer: Medicare Other | Admitting: Cardiology

## 2021-03-15 DIAGNOSIS — N2581 Secondary hyperparathyroidism of renal origin: Secondary | ICD-10-CM | POA: Diagnosis not present

## 2021-03-15 DIAGNOSIS — D689 Coagulation defect, unspecified: Secondary | ICD-10-CM | POA: Diagnosis not present

## 2021-03-15 DIAGNOSIS — Z992 Dependence on renal dialysis: Secondary | ICD-10-CM | POA: Diagnosis not present

## 2021-03-15 DIAGNOSIS — D509 Iron deficiency anemia, unspecified: Secondary | ICD-10-CM | POA: Diagnosis not present

## 2021-03-15 DIAGNOSIS — D631 Anemia in chronic kidney disease: Secondary | ICD-10-CM | POA: Diagnosis not present

## 2021-03-15 DIAGNOSIS — N186 End stage renal disease: Secondary | ICD-10-CM | POA: Diagnosis not present

## 2021-03-17 DIAGNOSIS — N186 End stage renal disease: Secondary | ICD-10-CM | POA: Diagnosis not present

## 2021-03-17 DIAGNOSIS — D631 Anemia in chronic kidney disease: Secondary | ICD-10-CM | POA: Diagnosis not present

## 2021-03-17 DIAGNOSIS — D689 Coagulation defect, unspecified: Secondary | ICD-10-CM | POA: Diagnosis not present

## 2021-03-17 DIAGNOSIS — Z992 Dependence on renal dialysis: Secondary | ICD-10-CM | POA: Diagnosis not present

## 2021-03-17 DIAGNOSIS — D509 Iron deficiency anemia, unspecified: Secondary | ICD-10-CM | POA: Diagnosis not present

## 2021-03-17 DIAGNOSIS — N2581 Secondary hyperparathyroidism of renal origin: Secondary | ICD-10-CM | POA: Diagnosis not present

## 2021-03-20 DIAGNOSIS — D509 Iron deficiency anemia, unspecified: Secondary | ICD-10-CM | POA: Diagnosis not present

## 2021-03-20 DIAGNOSIS — Z992 Dependence on renal dialysis: Secondary | ICD-10-CM | POA: Diagnosis not present

## 2021-03-20 DIAGNOSIS — N186 End stage renal disease: Secondary | ICD-10-CM | POA: Diagnosis not present

## 2021-03-20 DIAGNOSIS — D689 Coagulation defect, unspecified: Secondary | ICD-10-CM | POA: Diagnosis not present

## 2021-03-20 DIAGNOSIS — N2581 Secondary hyperparathyroidism of renal origin: Secondary | ICD-10-CM | POA: Diagnosis not present

## 2021-03-20 DIAGNOSIS — D631 Anemia in chronic kidney disease: Secondary | ICD-10-CM | POA: Diagnosis not present

## 2021-03-22 DIAGNOSIS — D689 Coagulation defect, unspecified: Secondary | ICD-10-CM | POA: Diagnosis not present

## 2021-03-22 DIAGNOSIS — N186 End stage renal disease: Secondary | ICD-10-CM | POA: Diagnosis not present

## 2021-03-22 DIAGNOSIS — N2581 Secondary hyperparathyroidism of renal origin: Secondary | ICD-10-CM | POA: Diagnosis not present

## 2021-03-22 DIAGNOSIS — Z992 Dependence on renal dialysis: Secondary | ICD-10-CM | POA: Diagnosis not present

## 2021-03-22 DIAGNOSIS — D631 Anemia in chronic kidney disease: Secondary | ICD-10-CM | POA: Diagnosis not present

## 2021-03-22 DIAGNOSIS — D509 Iron deficiency anemia, unspecified: Secondary | ICD-10-CM | POA: Diagnosis not present

## 2021-03-24 DIAGNOSIS — D631 Anemia in chronic kidney disease: Secondary | ICD-10-CM | POA: Diagnosis not present

## 2021-03-24 DIAGNOSIS — Z992 Dependence on renal dialysis: Secondary | ICD-10-CM | POA: Diagnosis not present

## 2021-03-24 DIAGNOSIS — D689 Coagulation defect, unspecified: Secondary | ICD-10-CM | POA: Diagnosis not present

## 2021-03-24 DIAGNOSIS — N186 End stage renal disease: Secondary | ICD-10-CM | POA: Diagnosis not present

## 2021-03-24 DIAGNOSIS — D509 Iron deficiency anemia, unspecified: Secondary | ICD-10-CM | POA: Diagnosis not present

## 2021-03-24 DIAGNOSIS — N2581 Secondary hyperparathyroidism of renal origin: Secondary | ICD-10-CM | POA: Diagnosis not present

## 2021-03-27 DIAGNOSIS — N2581 Secondary hyperparathyroidism of renal origin: Secondary | ICD-10-CM | POA: Diagnosis not present

## 2021-03-27 DIAGNOSIS — D509 Iron deficiency anemia, unspecified: Secondary | ICD-10-CM | POA: Diagnosis not present

## 2021-03-27 DIAGNOSIS — Z992 Dependence on renal dialysis: Secondary | ICD-10-CM | POA: Diagnosis not present

## 2021-03-27 DIAGNOSIS — N186 End stage renal disease: Secondary | ICD-10-CM | POA: Diagnosis not present

## 2021-03-27 DIAGNOSIS — D631 Anemia in chronic kidney disease: Secondary | ICD-10-CM | POA: Diagnosis not present

## 2021-03-27 DIAGNOSIS — D689 Coagulation defect, unspecified: Secondary | ICD-10-CM | POA: Diagnosis not present

## 2021-03-29 DIAGNOSIS — D509 Iron deficiency anemia, unspecified: Secondary | ICD-10-CM | POA: Diagnosis not present

## 2021-03-29 DIAGNOSIS — D689 Coagulation defect, unspecified: Secondary | ICD-10-CM | POA: Diagnosis not present

## 2021-03-29 DIAGNOSIS — Z992 Dependence on renal dialysis: Secondary | ICD-10-CM | POA: Diagnosis not present

## 2021-03-29 DIAGNOSIS — N2581 Secondary hyperparathyroidism of renal origin: Secondary | ICD-10-CM | POA: Diagnosis not present

## 2021-03-29 DIAGNOSIS — D631 Anemia in chronic kidney disease: Secondary | ICD-10-CM | POA: Diagnosis not present

## 2021-03-29 DIAGNOSIS — N186 End stage renal disease: Secondary | ICD-10-CM | POA: Diagnosis not present

## 2021-03-31 DIAGNOSIS — D631 Anemia in chronic kidney disease: Secondary | ICD-10-CM | POA: Diagnosis not present

## 2021-03-31 DIAGNOSIS — D689 Coagulation defect, unspecified: Secondary | ICD-10-CM | POA: Diagnosis not present

## 2021-03-31 DIAGNOSIS — N186 End stage renal disease: Secondary | ICD-10-CM | POA: Diagnosis not present

## 2021-03-31 DIAGNOSIS — Z992 Dependence on renal dialysis: Secondary | ICD-10-CM | POA: Diagnosis not present

## 2021-03-31 DIAGNOSIS — N2581 Secondary hyperparathyroidism of renal origin: Secondary | ICD-10-CM | POA: Diagnosis not present

## 2021-03-31 DIAGNOSIS — D509 Iron deficiency anemia, unspecified: Secondary | ICD-10-CM | POA: Diagnosis not present

## 2021-04-05 DIAGNOSIS — Z992 Dependence on renal dialysis: Secondary | ICD-10-CM | POA: Diagnosis not present

## 2021-04-05 DIAGNOSIS — D689 Coagulation defect, unspecified: Secondary | ICD-10-CM | POA: Diagnosis not present

## 2021-04-05 DIAGNOSIS — N186 End stage renal disease: Secondary | ICD-10-CM | POA: Diagnosis not present

## 2021-04-05 DIAGNOSIS — D509 Iron deficiency anemia, unspecified: Secondary | ICD-10-CM | POA: Diagnosis not present

## 2021-04-05 DIAGNOSIS — N2581 Secondary hyperparathyroidism of renal origin: Secondary | ICD-10-CM | POA: Diagnosis not present

## 2021-04-05 DIAGNOSIS — D631 Anemia in chronic kidney disease: Secondary | ICD-10-CM | POA: Diagnosis not present

## 2021-04-07 DIAGNOSIS — N2581 Secondary hyperparathyroidism of renal origin: Secondary | ICD-10-CM | POA: Diagnosis not present

## 2021-04-07 DIAGNOSIS — D509 Iron deficiency anemia, unspecified: Secondary | ICD-10-CM | POA: Diagnosis not present

## 2021-04-07 DIAGNOSIS — D631 Anemia in chronic kidney disease: Secondary | ICD-10-CM | POA: Diagnosis not present

## 2021-04-07 DIAGNOSIS — N186 End stage renal disease: Secondary | ICD-10-CM | POA: Diagnosis not present

## 2021-04-07 DIAGNOSIS — D689 Coagulation defect, unspecified: Secondary | ICD-10-CM | POA: Diagnosis not present

## 2021-04-07 DIAGNOSIS — Z992 Dependence on renal dialysis: Secondary | ICD-10-CM | POA: Diagnosis not present

## 2021-04-08 DIAGNOSIS — Z992 Dependence on renal dialysis: Secondary | ICD-10-CM | POA: Diagnosis not present

## 2021-04-08 DIAGNOSIS — N186 End stage renal disease: Secondary | ICD-10-CM | POA: Diagnosis not present

## 2021-04-08 DIAGNOSIS — I129 Hypertensive chronic kidney disease with stage 1 through stage 4 chronic kidney disease, or unspecified chronic kidney disease: Secondary | ICD-10-CM | POA: Diagnosis not present

## 2021-05-02 DIAGNOSIS — Z992 Dependence on renal dialysis: Secondary | ICD-10-CM | POA: Diagnosis not present

## 2021-05-02 DIAGNOSIS — N186 End stage renal disease: Secondary | ICD-10-CM | POA: Diagnosis not present

## 2021-05-02 DIAGNOSIS — I871 Compression of vein: Secondary | ICD-10-CM | POA: Diagnosis not present

## 2021-05-02 DIAGNOSIS — T82858A Stenosis of vascular prosthetic devices, implants and grafts, initial encounter: Secondary | ICD-10-CM | POA: Diagnosis not present

## 2021-05-09 DIAGNOSIS — I129 Hypertensive chronic kidney disease with stage 1 through stage 4 chronic kidney disease, or unspecified chronic kidney disease: Secondary | ICD-10-CM | POA: Diagnosis not present

## 2021-05-09 DIAGNOSIS — Z992 Dependence on renal dialysis: Secondary | ICD-10-CM | POA: Diagnosis not present

## 2021-05-09 DIAGNOSIS — N186 End stage renal disease: Secondary | ICD-10-CM | POA: Diagnosis not present

## 2021-06-06 DIAGNOSIS — I129 Hypertensive chronic kidney disease with stage 1 through stage 4 chronic kidney disease, or unspecified chronic kidney disease: Secondary | ICD-10-CM | POA: Diagnosis not present

## 2021-06-06 DIAGNOSIS — Z992 Dependence on renal dialysis: Secondary | ICD-10-CM | POA: Diagnosis not present

## 2021-06-06 DIAGNOSIS — N186 End stage renal disease: Secondary | ICD-10-CM | POA: Diagnosis not present

## 2021-07-03 ENCOUNTER — Emergency Department (HOSPITAL_COMMUNITY)
Admission: EM | Admit: 2021-07-03 | Discharge: 2021-07-04 | Disposition: A | Discharge status: Home / Self Care | Payer: Medicare Other | Attending: Emergency Medicine | Admitting: Emergency Medicine

## 2021-07-03 ENCOUNTER — Emergency Department (HOSPITAL_COMMUNITY): Payer: Medicare Other

## 2021-07-03 ENCOUNTER — Other Ambulatory Visit: Payer: Self-pay

## 2021-07-03 ENCOUNTER — Encounter (HOSPITAL_COMMUNITY): Payer: Self-pay | Admitting: Emergency Medicine

## 2021-07-03 DIAGNOSIS — E871 Hypo-osmolality and hyponatremia: Secondary | ICD-10-CM | POA: Diagnosis not present

## 2021-07-03 DIAGNOSIS — Z20822 Contact with and (suspected) exposure to covid-19: Secondary | ICD-10-CM | POA: Diagnosis not present

## 2021-07-03 DIAGNOSIS — R079 Chest pain, unspecified: Secondary | ICD-10-CM | POA: Diagnosis not present

## 2021-07-03 DIAGNOSIS — Z7901 Long term (current) use of anticoagulants: Secondary | ICD-10-CM | POA: Diagnosis not present

## 2021-07-03 DIAGNOSIS — E878 Other disorders of electrolyte and fluid balance, not elsewhere classified: Secondary | ICD-10-CM | POA: Insufficient documentation

## 2021-07-03 DIAGNOSIS — R7309 Other abnormal glucose: Secondary | ICD-10-CM | POA: Insufficient documentation

## 2021-07-03 DIAGNOSIS — I120 Hypertensive chronic kidney disease with stage 5 chronic kidney disease or end stage renal disease: Secondary | ICD-10-CM | POA: Insufficient documentation

## 2021-07-03 DIAGNOSIS — D631 Anemia in chronic kidney disease: Secondary | ICD-10-CM | POA: Insufficient documentation

## 2021-07-03 DIAGNOSIS — R059 Cough, unspecified: Secondary | ICD-10-CM | POA: Diagnosis not present

## 2021-07-03 DIAGNOSIS — Z79899 Other long term (current) drug therapy: Secondary | ICD-10-CM | POA: Diagnosis not present

## 2021-07-03 DIAGNOSIS — N186 End stage renal disease: Secondary | ICD-10-CM | POA: Diagnosis not present

## 2021-07-03 DIAGNOSIS — R519 Headache, unspecified: Secondary | ICD-10-CM | POA: Insufficient documentation

## 2021-07-03 DIAGNOSIS — Z992 Dependence on renal dialysis: Secondary | ICD-10-CM | POA: Insufficient documentation

## 2021-07-03 LAB — BASIC METABOLIC PANEL
Anion gap: 11 (ref 5–15)
BUN: 38 mg/dL — ABNORMAL HIGH (ref 6–20)
CO2: 26 mmol/L (ref 22–32)
Calcium: 9.4 mg/dL (ref 8.9–10.3)
Chloride: 97 mmol/L — ABNORMAL LOW (ref 98–111)
Creatinine, Ser: 11.41 mg/dL — ABNORMAL HIGH (ref 0.61–1.24)
GFR, Estimated: 5 mL/min — ABNORMAL LOW (ref >60)
Glucose, Bld: 135 mg/dL — ABNORMAL HIGH (ref 70–99)
Potassium: 3.9 mmol/L (ref 3.5–5.1)
Sodium: 134 mmol/L — ABNORMAL LOW (ref 135–145)

## 2021-07-03 LAB — CBC
HCT: 35.3 % — ABNORMAL LOW (ref 39.0–52.0)
Hemoglobin: 11.7 g/dL — ABNORMAL LOW (ref 13.0–17.0)
MCH: 31.9 pg (ref 26.0–34.0)
MCHC: 33.1 g/dL (ref 30.0–36.0)
MCV: 96.2 fL (ref 80.0–100.0)
Platelets: 381 10*3/uL (ref 150–400)
RBC: 3.67 MIL/uL — ABNORMAL LOW (ref 4.22–5.81)
RDW: 13.6 % (ref 11.5–15.5)
WBC: 10.2 10*3/uL (ref 4.0–10.5)
nRBC: 0 % (ref 0.0–0.2)

## 2021-07-03 LAB — TROPONIN I (HIGH SENSITIVITY)
Troponin I (High Sensitivity): 52 ng/L — ABNORMAL HIGH (ref <18)
Troponin I (High Sensitivity): 55 ng/L — ABNORMAL HIGH (ref <18)

## 2021-07-03 LAB — HEPATITIS B SURFACE ANTIGEN: Hepatitis B Surface Ag: NONREACTIVE

## 2021-07-03 LAB — HEPATITIS B SURFACE ANTIBODY,QUALITATIVE: Hep B S Ab: REACTIVE — AB

## 2021-07-03 MED ORDER — HEPARIN SODIUM (PORCINE) 1000 UNIT/ML DIALYSIS
4100.0000 [IU] | Freq: Once | INTRAMUSCULAR | Status: DC
  Filled 2021-07-03: qty 5

## 2021-07-03 MED ORDER — SODIUM CHLORIDE 0.9 % IV SOLN: 100.0000 mL | INTRAVENOUS | Status: DC | PRN

## 2021-07-03 MED ORDER — CARVEDILOL 12.5 MG PO TABS
25.0000 mg | ORAL_TABLET | Freq: Two times a day (BID) | ORAL | Status: DC
  Administered 2021-07-03: 25 mg via ORAL
  Filled 2021-07-03: qty 2

## 2021-07-03 MED ORDER — APIXABAN 5 MG PO TABS
5.0000 mg | ORAL_TABLET | Freq: Two times a day (BID) | ORAL | Status: DC
  Administered 2021-07-03: 5 mg via ORAL
  Filled 2021-07-03: qty 1

## 2021-07-03 MED ORDER — PENTAFLUOROPROP-TETRAFLUOROETH EX AERO: 1.0000 "application " | INHALATION_SPRAY | CUTANEOUS | Status: DC | PRN

## 2021-07-03 MED ORDER — LIDOCAINE-PRILOCAINE 2.5-2.5 % EX CREA: 1.0000 "application " | TOPICAL_CREAM | CUTANEOUS | Status: DC | PRN

## 2021-07-03 MED ORDER — CHLORHEXIDINE GLUCONATE CLOTH 2 % EX PADS: 6.0000 | MEDICATED_PAD | Freq: Every day | CUTANEOUS | Status: DC

## 2021-07-03 MED ORDER — LIDOCAINE HCL (PF) 1 % IJ SOLN: 5.0000 mL | INTRAMUSCULAR | Status: DC | PRN

## 2021-07-03 MED ORDER — AMLODIPINE BESYLATE 5 MG PO TABS
10.0000 mg | ORAL_TABLET | Freq: Every day | ORAL | Status: DC
  Administered 2021-07-03: 10 mg via ORAL
  Filled 2021-07-03: qty 2

## 2021-07-03 NOTE — ED Provider Notes (Signed)
?[facility] ?Provider Note ? ? ?CSN: [id number] ?Arrival date & time: 07/03/21  1036 ? ?  ? ?History ? ?Chief Complaint  ?Patient presents with  ? Chest Pain  ? Cough  ? Headache  ? ? ?[name] is a 56 y.o. male. ? ?HPI ?Patient presenting from home after being unable to go to usual dialysis treatment this morning.  He complains of headache and cough.  Cough is worsening.  No sputum production.  Ongoing symptoms for several weeks.  He is chronically ill with numerous medical problems.  He has end-stage renal disease, hypertension, sleep apnea, and panic disorder. ?  ? ?Home Medications ?Prior to Admission medications   ?Medication Sig Start Date End Date Taking? Authorizing Provider  ?Accu-Chek Softclix Lancets lancets Use as directed. ? ?For insulin-dependent type 2 diabetes ?Monitor  glucose 3 times a day 01/06/20   Florencia Reasons, MD  ?acetaminophen (TYLENOL) 500 MG tablet Take 1,000-1,500 mg by mouth 2 (two) times daily as needed (pain).    [provider]  ?albuterol (PROVENTIL HFA;VENTOLIN HFA) 108 (90 BASE) MCG/ACT inhaler Inhale 2 puffs into the lungs every 6 (six) hours as needed for wheezing or shortness of breath.     [provider]  ?amLODipine (NORVASC) 10 MG tablet Take 1 tablet (10 mg total) by mouth daily. 01/06/20   Florencia Reasons, MD  ?apixaban (ELIQUIS) 5 MG TABS tablet Take 5 mg by mouth 2 (two) times daily.    [provider]  ?calcitRIOL (ROCALTROL) 0.25 MCG capsule Take 1 capsule (0.25 mcg total) by mouth every Monday, Wednesday, and Friday. 01/08/20   Florencia Reasons, MD  ?carvedilol (COREG) 25 MG tablet Take 1 tablet (25 mg total) by mouth 2 (two) times daily with a meal. 01/06/20   Florencia Reasons, MD  ?fluticasone (FLONASE) 50 MCG/ACT nasal spray Place 1 spray into both nostrils 2 (two) times daily. 10/06/19   [provider]  ?gabapentin (NEURONTIN) 300 MG capsule Take 1 capsule (300 mg total) by mouth daily. 10/28/19   Georgette Shell,  MD  ?glucose blood test strip Use as instructed ?For insulin-dependent type 2 diabetes ?Monitor  glucose 3 times a day 01/06/20   Florencia Reasons, MD  ?HYDROcodone-acetaminophen [facility]) 7.5-325 MG tablet Take 1 tablet by mouth every 4 (four) hours as needed. ?Patient not taking: Reported on 02/25/2020 12/11/19   [provider]  ?insulin NPH-regular Human (70-30) 100 UNIT/ML injection Inject 25 Units into the skin 2 (two) times daily before a meal.     [provider]  ?levocetirizine (XYZAL) 5 MG tablet Take 5 mg by mouth at bedtime. 10/06/19   [provider]  ?multivitamin (RENA-VIT) TABS tablet Take 1 tablet by mouth at bedtime. 01/06/20   Florencia Reasons, MD  ?sevelamer carbonate (RENVELA) 800 MG tablet Take 1 tablet (800 mg total) by mouth 3 (three) times daily with meals. 01/06/20   Florencia Reasons, MD  ?tiZANidine (ZANAFLEX) 4 MG tablet Take 4 mg by mouth every 8 (eight) hours as needed for muscle spasms.  04/08/17   [provider]  ?traMADol (ULTRAM) 50 MG tablet Take 50 mg by mouth 3 (three) times daily as needed (pain).  08/17/19   [provider]  ?Vitamin D, Ergocalciferol, (DRISDOL) 1.25 MG (50000 UNIT) CAPS capsule Take 50,000 Units by mouth every Sunday. 08/17/19   [provider]  ?   ? ?Allergies    ?Patient has no known allergies.   ? ?Review of Systems   ?  Review of Systems ? ?Physical Exam ?Updated Vital Signs ?BP (!) 192/113 (BP Location: Right Arm)   Pulse 95   Temp 98.7 ?F (37.1 ?C) (Oral)   Resp 15   SpO2 96%  ?Physical Exam ?Vitals and nursing note reviewed.  ?Constitutional:   ?   Appearance: He is well-developed. He is not ill-appearing.  ?HENT:  ?   Head: Normocephalic and atraumatic.  ?   Right Ear: External ear normal.  ?   Left Ear: External ear normal.  ?Eyes:  ?   Conjunctiva/sclera: Conjunctivae normal.  ?   Pupils: Pupils are equal, round, and reactive to light.  ?Neck:  ?   Trachea: Phonation normal.  ?Cardiovascular:  ?   Rate and Rhythm: Normal rate  and regular rhythm.  ?   Heart sounds: Normal heart sounds.  ?   Comments: Fistula left upper arm with normal pulsation ?Pulmonary:  ?   Effort: Pulmonary effort is normal. No respiratory distress.  ?   Breath sounds: No stridor.  ?   Comments: Somewhat decreased breath sounds bilaterally right greater than left.  No audible wheezes rales or rhonchi.  There is no increased work of breathing. ?Abdominal:  ?   General: There is no distension.  ?   Palpations: Abdomen is soft.  ?   Tenderness: There is no abdominal tenderness.  ?Musculoskeletal:     ?   General: Normal range of motion.  ?   Cervical back: Normal range of motion and neck supple.  ?Skin: ?   General: Skin is warm and dry.  ?Neurological:  ?   Mental Status: He is alert and oriented to person, place, and time.  ?   Cranial Nerves: No cranial nerve deficit.  ?   Sensory: No sensory deficit.  ?   Motor: No abnormal muscle tone.  ?   Coordination: Coordination normal.  ?Psychiatric:     ?   Mood and Affect: Mood normal.     ?   Behavior: Behavior normal.     ?   Thought Content: Thought content normal.     ?   Judgment: Judgment normal.  ? ? ?ED Results / Procedures / Treatments   ?Labs ?(all labs ordered are listed, but only abnormal results are displayed) ?Labs Reviewed  ?BASIC METABOLIC PANEL - Abnormal; Notable for the following components:  ?    Result Value  ? Sodium 134 (*)   ? Chloride 97 (*)   ? Glucose, Bld 135 (*)   ? BUN 38 (*)   ? Creatinine, Ser 11.41 (*)   ? GFR, Estimated 5 (*)   ? All other components within normal limits  ?CBC - Abnormal; Notable for the following components:  ? RBC 3.67 (*)   ? Hemoglobin 11.7 (*)   ? HCT 35.3 (*)   ? All other components within normal limits  ?TROPONIN I (HIGH SENSITIVITY) - Abnormal; Notable for the following components:  ? Troponin I (High Sensitivity) 52 (*)   ? All other components within normal limits  ?TROPONIN I (HIGH SENSITIVITY) - Abnormal; Notable for the following components:  ? Troponin I  (High Sensitivity) 55 (*)   ? All other components within normal limits  ? ? ?EKG ?EKG Interpretation ? ?Date/Time:  Monday July 03 2021 10:46:18 EDT ?Ventricular Rate:  100 ?PR Interval:  164 ?QRS Duration: 80 ?QT Interval:  360 ?QTC Calculation: 464 ?R Axis:   26 ?Text Interpretation: Sinus rhythm with occasional Premature ventricular complexes Otherwise  normal ECG When compared with ECG of 30-Dec-2019 10:15, PREVIOUS ECG IS PRESENT Since last tracing QT has shortened Otherwise no significant change Confirmed by Daleen Bo [id number]) on 07/03/2021 3:40:36 PM ? ?Radiology ?DG Chest 2 View ? ?Result Date: 07/03/2021 ?CLINICAL DATA:  Chest pain, cough EXAM: CHEST - 2 VIEW COMPARISON:  Previous studies including the examination of 12/30/2019 FINDINGS: Cardiac size is within normal limits. Thoracic aorta is tortuous and ectatic. There are no signs of alveolar pulmonary edema. There is no focal pulmonary consolidation. There is subtle increase in interstitial markings in the right parahilar region and right lower lung fields. There is no pleural effusion or pneumothorax. IMPRESSION: There are no signs of alveolar pulmonary edema or focal pulmonary consolidation. There is slight prominence of interstitial markings in the right parahilar region and right lower lung fields suggesting possible interstitial pneumonia. Less likely possibility would be asymmetric interstitial pulmonary edema. Electronically Signed   By: Elmer Picker M.D.   On: 07/03/2021 11:10   ? ?Procedures ?Procedures  ? ? ?Medications Ordered in ED ?Medications  ?Chlorhexidine Gluconate Cloth 2 % PADS 6 each (has no administration in time range)  ?amLODipine (NORVASC) tablet 10 mg (10 mg Oral Given 07/03/21 1623)  ?apixaban (ELIQUIS) tablet 5 mg (has no administration in time range)  ?carvedilol (COREG) tablet 25 mg (25 mg Oral Given 07/03/21 1622)  ? ? ?ED Course/ Medical Decision Making/ A&P ?Clinical Course as of 07/03/21 1709  ?Mon Jul 03, 2021   ?1553 Case discussed with nephrology, Dr. Joelyn Oms who plans on dialyzing the patient and sending him back to the ED for reassessment and possible discharge. [EW]  ?  ?Clinical Course User Index ?[EW] Eulis Foster,

## 2021-07-03 NOTE — ED Notes (Signed)
Patient is resting comfortably. 

## 2021-07-03 NOTE — ED Provider Notes (Addendum)
Care of patient assumed from Dr. Eulis Foster at 5 PM.  This patient missed dialysis today.  He has had some chest pain and shortness of breath that is likely secondary to fluid overload.  Plan is for dialysis and return to the ED for reevaluation. ?Physical Exam  ?BP (!) 192/113 (BP Location: Right Arm)   Pulse 95   Temp 98.7 ?F (37.1 ?C) (Oral)   Resp 15   SpO2 96%  ? ?Physical Exam ?Constitutional:   ?   General: He is sleeping. He is not in acute distress. ?   Appearance: Normal appearance. He is well-developed. He is not ill-appearing, toxic-appearing or diaphoretic.  ?Cardiovascular:  ?   Rate and Rhythm: Normal rate and regular rhythm.  ?Pulmonary:  ?   Effort: Pulmonary effort is normal. No tachypnea, accessory muscle usage or respiratory distress.  ? ? ?Procedures  ?Procedures ? ?ED Course / MDM  ? ?Clinical Course as of 07/03/21 1709  ?Mon Jul 03, 2021  ?1553 Case discussed with nephrology, Dr. Joelyn Oms who plans on dialyzing the patient and sending him back to the ED for reassessment and possible discharge. [EW]  ?  ?Clinical Course User Index ?[EW] Daleen Bo, MD  ? ?Medical Decision Making ?Amount and/or Complexity of Data Reviewed ?Labs: ordered. ?Radiology: ordered. ? ?Risk ?Prescription drug management. ? ? ?On initial assessment, patient sleeping.  His breathing is unlabored and his SPO2 is normal on room air.  On reassessment, patient awake.  He denies any worsening of symptoms.  He is breathing remains unlabored.  He was able to eat.  Home medications have been ordered.  Patient remained in the ED awaiting dialysis throughout remainder of shift.  Patient was transported to dialysis.  He will return to the ED for reevaluation following HD. ? ? ? ? ?  ?Godfrey Pick, MD ?07/04/21 [phone] ? ?

## 2021-07-03 NOTE — Progress Notes (Signed)
Camdon Lobo is a 56 Y/O male with ESRD on hemodialysis MWF at [facility]. PMH: Hypertension, diabetes, obstructive sleep apnea, chronic diastolic CHF, paroxysmal atrial fibrillation on Apixiban, leg ulcer, SHPT, Anemia of ESRD. He is usually compliant with dialysis but missed HD this AM due to cough, feeling too poorly to attend HD. He says he has had cough for several days, subjective fevers, general malaise. He presented to ED this AM for evaluation. Labs unremarkable for ESRD patient. WBC 10.2 HGB 11.7. CXR without evidence of pulmonary edema but there is slight prominence of interstitial markings ?in the right parahilar region and right lower lung fields suggesting possible interstitial pneumonia. We will dialyze patient today and consult formally if patient is admitted.  ? ?HD Orders: [facility] MWF ?4:15 hrs 180NRe 500/Autoflow 1.5 104.5 kg 2.0K/2.0 Ca UF profile 2 AVF ?-Heparin 4100 units IV TIW ?-Venofer '50mg'$  IV q week ?-Mircera 30 mcg IV q 2 weeks (last dose 06/28/2021) ?-Sensipar 180 mg PO TIW ? ? ?Juanell Fairly NPC ?[place] ?[phone] ? ?

## 2021-07-03 NOTE — ED Triage Notes (Signed)
Patient coming from home, complaint of headache and cough for several weeks, chest pain and shob that have started over last few days. Pt too weak to go to dialysis treatment this am. ?

## 2021-07-04 LAB — RESP PANEL BY RT-PCR (FLU A&B, COVID) ARPGX2
Influenza A by PCR: NEGATIVE
Influenza B by PCR: NEGATIVE
SARS Coronavirus 2 by RT PCR: NEGATIVE

## 2021-07-04 NOTE — ED Provider Notes (Signed)
Patient returned from dialysis.  States he feels well.  Hemodynamically stable.  Will discharge home.  Patient advised to continue his regularly scheduled dialysis. ?  ?Merryl Hacker, MD ?07/04/21 [phone] ? ?

## 2021-07-04 NOTE — ED Notes (Signed)
Patient discharge instructions reviewed with the patient. The patient verbalized understanding of instructions. Patient discharged. 

## 2021-07-04 NOTE — Discharge Instructions (Signed)
Follow-up with dialysis on your regular schedule. ?

## 2021-07-05 LAB — HEPATITIS B SURFACE ANTIBODY, QUANTITATIVE: Hep B S AB Quant (Post): >1000 m[IU]/mL (ref >9.9)

## 2021-07-07 DIAGNOSIS — I129 Hypertensive chronic kidney disease with stage 1 through stage 4 chronic kidney disease, or unspecified chronic kidney disease: Secondary | ICD-10-CM | POA: Diagnosis not present

## 2021-07-07 DIAGNOSIS — N186 End stage renal disease: Secondary | ICD-10-CM | POA: Diagnosis not present

## 2021-07-07 DIAGNOSIS — Z992 Dependence on renal dialysis: Secondary | ICD-10-CM | POA: Diagnosis not present

## 2021-09-19 ENCOUNTER — Other Ambulatory Visit: Payer: Self-pay | Admitting: Internal Medicine

## 2021-09-19 ENCOUNTER — Ambulatory Visit
Admission: RE | Admit: 2021-09-19 | Discharge: 2021-09-19 | Disposition: A | Discharge status: Home / Self Care | Payer: Medicare Other | Source: Ambulatory Visit | Attending: Internal Medicine | Admitting: Internal Medicine

## 2021-09-19 DIAGNOSIS — R059 Cough, unspecified: Secondary | ICD-10-CM

## 2021-11-02 ENCOUNTER — Encounter (HOSPITAL_BASED_OUTPATIENT_CLINIC_OR_DEPARTMENT_OTHER): Payer: Self-pay | Admitting: Cardiology

## 2021-11-02 ENCOUNTER — Ambulatory Visit (HOSPITAL_BASED_OUTPATIENT_CLINIC_OR_DEPARTMENT_OTHER): Payer: Medicare Other | Admitting: Cardiology

## 2021-11-02 VITALS — BP 142/100 | HR 87 | Ht 67.0 in | Wt 238.1 lb

## 2021-11-02 DIAGNOSIS — I10 Essential (primary) hypertension: Secondary | ICD-10-CM | POA: Diagnosis not present

## 2021-11-02 DIAGNOSIS — Z992 Dependence on renal dialysis: Secondary | ICD-10-CM

## 2021-11-02 DIAGNOSIS — Z794 Long term (current) use of insulin: Secondary | ICD-10-CM

## 2021-11-02 DIAGNOSIS — Z7901 Long term (current) use of anticoagulants: Secondary | ICD-10-CM | POA: Diagnosis not present

## 2021-11-02 DIAGNOSIS — E1122 Type 2 diabetes mellitus with diabetic chronic kidney disease: Secondary | ICD-10-CM

## 2021-11-02 DIAGNOSIS — I480 Paroxysmal atrial fibrillation: Secondary | ICD-10-CM

## 2021-11-02 DIAGNOSIS — N186 End stage renal disease: Secondary | ICD-10-CM

## 2021-11-02 NOTE — Patient Instructions (Signed)

## 2021-11-02 NOTE — Progress Notes (Signed)
Cardiology Office Note:    Date:  11/02/2021   ID:  [name], DOB Sep 09, 1965, MRN [id number]  PCP:  Bartholome Bill, MD  Cardiologist:  Buford Dresser, MD  Referring MD: Bartholome Bill, MD   CC: follow up  History of Present Illness:    [name] is a 56 y.o. male with a hx of ESRD on dialysis, paroxysmal atrial fibrillation, type II diabetes, hypertension, OSA on CPAP, chronic diastolic heart failure, chronic lower extremity wound who is seen for follow up. I initially met him 02/02/20 as a new consult at the request of Bartholome Bill, MD for the evaluation and management of atrial fibrillation.  Cardiac history: Hospitalization at [facility] 11/01/18 where he was diagnosed with atrial fibrillation with RVR. Thinks he has had a heart attack while in [place] in 1998. No procedures done. Just remembers feeling like he couldn't breathe, EMS told him he was near death.  Follows with Dr. Posey Pronto at [facility].  Today: Nonproductive cough today but otherwise doing well. Was told he had "walking pneumonia" about 3 mos ago, cough has persisted. Told he might have acid reflux, but medication has not improved this. Cough is worse when laying down at night, but no shortness of breath with it. Recently had his dry weight adjusted, but this hasn't changed symptoms. Tolerating HD on M/W/F, only one low blood pressure. Hasn't taken his BP meds yet today. No chest pain or shortness of breath. Trying to get a kidney transplant through [facility].   Has not felt afib, did have symptoms previously. Tolerating apixaban, no significant bleeding issues.   Denies chest pain, shortness of breath at rest or with normal exertion. No PND, orthopnea, LE edema or unexpected weight gain. No syncope or palpitations.   Past Medical History:  Diagnosis Date   Acute kidney injury ([place]) 12/28/2014   Adjustment disorder with mixed anxiety and depressed mood 12/28/2014   CPAP (continuous positive  airway pressure) dependence    Diabetes mellitus without complication ([place])    DM (diabetes mellitus) type II controlled with renal manifestation ([place]) 12/28/2014   DM (diabetes mellitus) type II controlled, neurological manifestation ([place]) 12/28/2014   Encephalopathy, hypertensive 12/28/2014   Hypertension    Hypertensive emergency without congestive heart failure 12/27/2014   Possible Panic disorder 12/28/2014   Renal disorder    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   Sleep apnea     Past Surgical History:  Procedure Laterality Date   AV FISTULA PLACEMENT Left 01/05/2020   Procedure: LEFT ARM BRACHIOCEPHALIC ARTERIOVENOUS (AV) FISTULA;  Surgeon: Angelia Mould, MD;  Location: [place];  Service: Vascular;  Laterality: Left;   EMPYEMA DRAINAGE Right 05/17/2017   Procedure: EMPYEMA DRAINAGE AND DECORTICATION;  Surgeon: Grace Isaac, MD;  Location: [place];  Service: Thoracic;  Laterality: Right;   FLEXIBLE BRONCHOSCOPY  05/17/2017   Procedure: FLEXIBLE BRONCHOSCOPY;  Surgeon: Grace Isaac, MD;  Location: [place];  Service: Thoracic;;   IR FLUORO GUIDE CV LINE RIGHT  01/01/2020   IR THORACENTESIS ASP PLEURAL SPACE W/IMG GUIDE  05/17/2017   IR US GUIDE VASC ACCESS RIGHT  01/01/2020   NO PAST SURGERIES     THORACOTOMY/LOBECTOMY Right 05/17/2017   Procedure: RIGHT MINI THORACOTOMY;  Surgeon: Grace Isaac, MD;  Location: [place];  Service: Thoracic;  Laterality: Right;   VIDEO ASSISTED THORACOSCOPY (VATS)/EMPYEMA Right 05/17/2017   Procedure: RIGHT VIDEO ASSISTED THORACOSCOPY (VATS)/EMPYEMA;  Surgeon: Grace Isaac, MD;  Location: [place];  Service: Thoracic;  Laterality: Right;    Current Medications: Current Outpatient Medications on File Prior to Visit  Medication Sig   Accu-Chek Softclix Lancets lancets Use as directed.  For insulin-dependent type 2 diabetes Monitor  glucose 3 times a day   acetaminophen (TYLENOL) 500 MG tablet Take 1,000-1,500 mg by mouth 2 (two) times daily as  needed (pain).   albuterol (PROVENTIL HFA;VENTOLIN HFA) 108 (90 BASE) MCG/ACT inhaler Inhale 2 puffs into the lungs every 6 (six) hours as needed for wheezing or shortness of breath.    amLODipine (NORVASC) 10 MG tablet Take 1 tablet (10 mg total) by mouth daily.   apixaban (ELIQUIS) 5 MG TABS tablet Take 5 mg by mouth 2 (two) times daily.   calcitRIOL (ROCALTROL) 0.25 MCG capsule Take 1 capsule (0.25 mcg total) by mouth every Monday, Wednesday, and Friday.   carvedilol (COREG) 25 MG tablet Take 1 tablet (25 mg total) by mouth 2 (two) times daily with a meal.   fluticasone (FLONASE) 50 MCG/ACT nasal spray Place 1 spray into both nostrils 2 (two) times daily.   gabapentin (NEURONTIN) 300 MG capsule Take 1 capsule (300 mg total) by mouth daily.   glucose blood test strip Use as instructed For insulin-dependent type 2 diabetes Monitor  glucose 3 times a day   HYDROcodone-acetaminophen (NORCO) 7.5-325 MG tablet Take 1 tablet by mouth every 4 (four) hours as needed.   insulin NPH-regular Human (70-30) 100 UNIT/ML injection Inject 25 Units into the skin 2 (two) times daily before a meal.    levocetirizine (XYZAL) 5 MG tablet Take 5 mg by mouth at bedtime.   multivitamin (RENA-VIT) TABS tablet Take 1 tablet by mouth at bedtime.   sevelamer carbonate (RENVELA) 800 MG tablet Take 1 tablet (800 mg total) by mouth 3 (three) times daily with meals.   tiZANidine (ZANAFLEX) 4 MG tablet Take 4 mg by mouth every 8 (eight) hours as needed for muscle spasms.    traMADol (ULTRAM) 50 MG tablet Take 50 mg by mouth 3 (three) times daily as needed (pain).    Vitamin D, Ergocalciferol, (DRISDOL) 1.25 MG (50000 UNIT) CAPS capsule Take 50,000 Units by mouth every Sunday.   No current facility-administered medications on file prior to visit.     Allergies:   Patient has no known allergies.   Social History   Tobacco Use   Smoking status: Never   Smokeless tobacco: Never  Vaping Use   Vaping Use: Never used   Substance Use Topics   Alcohol use: No   Drug use: No    Family History: family history includes Hypertension in his mother.  ROS:   Please see the history of present illness.  Additional pertinent ROS otherwise unremarkable.  EKGs/Labs/Other Studies Reviewed:    The following studies were reviewed today: Echo 05/28/2017 Left ventricle: The cavity size was normal. Wall thickness was    increased in a pattern of moderate LVH. Systolic function was    normal. The estimated ejection fraction was in the range of 60%    to 65%. Wall motion was normal; there were no regional wall    motion abnormalities. Doppler parameters are consistent with    abnormal left ventricular relaxation (grade 1 diastolic    dysfunction).  - Left atrium: The atrium was moderately dilated.  - Atrial septum: No defect or patent foramen ovale was identified.   EKG:  EKG is personally reviewed. 11/02/21 NSR at 87 bpm, borderline LVH  Recent Labs: 07/03/2021: BUN 38; Creatinine, Ser 11.41;  Hemoglobin 11.7; Platelets 381; Potassium 3.9; Sodium 134  Recent Lipid Panel No results found for: "CHOL", "TRIG", "HDL", "CHOLHDL", "VLDL", "LDLCALC", "LDLDIRECT"  Physical Exam:    VS:  BP (!) 142/100 (BP Location: Right Arm, Patient Position: Sitting, Cuff Size: Large)   Pulse 87   Ht _0  (1.702 m)   Wt 238 lb 1.6 oz (108 kg)   BMI 37.29 kg/m     Wt Readings from Last 3 Encounters:  11/02/21 238 lb 1.6 oz (108 kg)  02/25/20 229 lb (103.9 kg)  02/02/20 227 lb (103 kg)    GEN: Well nourished, well developed in no acute distress HEENT: Normal, moist mucous membranes NECK: No JVD CARDIAC: regular rhythm, normal S1 and S2, no rubs or gallops. No murmur. VASCULAR: Radial and DP pulses 2+ bilaterally. No carotid bruits. LUE AVF with +thrill/bruit RESPIRATORY:  Clear to auscultation without rales, wheezing or rhonchi  ABDOMEN: Soft, non-tender, non-distended MUSCULOSKELETAL:  Ambulates independently SKIN: Warm  and dry, no edema NEUROLOGIC:  Alert and oriented x 3. No focal neuro deficits noted. PSYCHIATRIC:  Normal affect    ASSESSMENT:    1. AF (paroxysmal atrial fibrillation) ([place])   2. ESRD (end stage renal disease) ([place])   3. Long term current use of anticoagulant   4. Essential hypertension   5. Type 2 diabetes mellitus with chronic kidney disease on chronic dialysis, with long-term current use of insulin (HCC)     PLAN:    Paroxysmal atrial fibrillation: -CHA2DS2/VAS Stroke Risk Points=3  -denies active bleeding. Continue apixaban -continue carvedilol  Hypertension: -has not taken meds yet today. Endorses only one hypotensive issue with dialysis. Management per nephrology, continue amlodipine/carvedilol  ESRD, on dialysis: -denies chest pain with dialysis  Type II diabetes: -on dialysis, no SGLT2i or GLP1RA -on insulin -data for statins as primary prevention in ESRD is equivocal at best. We discussed previously. No aspirin given apixaban  Cardiac risk counseling and prevention recommendations: -recommend heart healthy/Mediterranean diet, with whole grains, fruits, vegetable, fish, lean meats, nuts, and olive oil. Limit salt. -recommend moderate walking, 3-5 times/week for 30-50 minutes each session. Aim for at least 150 minutes.week. Goal should be pace of 3 miles/hours, or walking 1.5 miles in 30 minutes -recommend avoidance of tobacco products. Avoid excess alcohol. -ASCVD risk score: The 10-year ASCVD risk score (Arnett DK, et al., 2019) is: 23.8%   Values used to calculate the score:     Age: 74 years     Sex: Male     Is Non-Hispanic African American: Yes     Diabetic: Yes     Tobacco smoker: No     Systolic Blood Pressure: 831 mmHg     Is BP treated: Yes     HDL Cholesterol: 39 MG/DL     Total Cholesterol: 185 MG/DL    Plan for follow up: 1 year or sooner as needed  Buford Dresser, MD, PhD [place]  [facility]    Medication Adjustments/Labs  and Tests Ordered: Current medicines are reviewed at length with the patient today.  Concerns regarding medicines are outlined above.  Orders Placed This Encounter  Procedures   EKG 12-Lead   No orders of the defined types were placed in this encounter.   Patient Instructions  Medication Instructions:  Your Physician recommend you continue on your current medication as directed.    *If you need a refill on your cardiac medications before your next appointment, please call your pharmacy*   Lab Work: None ordered  today   Testing/Procedures: None ordered today   Follow-Up: At [facility], you and your health needs are our priority.  As part of our continuing mission to provide you with exceptional heart care, we have created designated Provider Care Teams.  These Care Teams include your primary Cardiologist (physician) and Advanced Practice Providers (APPs -  Physician Assistants and Nurse Practitioners) who all work together to provide you with the care you need, when you need it.  We recommend signing up for the patient portal called "MyChart".  Sign up information is provided on this After Visit Summary.  MyChart is used to connect with patients for Virtual Visits (Telemedicine).  Patients are able to view lab/test results, encounter notes, upcoming appointments, etc.  Non-urgent messages can be sent to your provider as well.   To learn more about what you can do with MyChart, go to [email or web address].    Your next appointment:   1 year(s)  The format for your next appointment:   In Person  Provider:   Buford Dresser, MD{          Signed, Buford Dresser, MD PhD 11/02/2021     [place]

## 2021-11-27 ENCOUNTER — Ambulatory Visit: Payer: Medicare Other | Admitting: Podiatry

## 2021-11-30 ENCOUNTER — Ambulatory Visit: Payer: Medicare Other | Admitting: Physician Assistant

## 2021-11-30 ENCOUNTER — Other Ambulatory Visit: Payer: Self-pay

## 2021-11-30 VITALS — BP 151/96 | HR 75 | Temp 97.6°F | Resp 20 | Ht 67.0 in | Wt 237.0 lb

## 2021-11-30 DIAGNOSIS — N186 End stage renal disease: Secondary | ICD-10-CM

## 2021-11-30 NOTE — Progress Notes (Signed)
Office Note     CC:  follow up Requesting Provider:  Bartholome Bill, MD  HPI: [name] is a 56 y.o. 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  This was created by Dr. Scot [name] in September 2021.  [name] has had outflow venoplasty at [facility] 2-3 times since then.  [name] states the aneurysmal area is painful especially when cannulating.  This pain is moderate to severe and lasts about 24 hours after dialysis.  [name] also experiences a lot of itching in this area and is concerned [name] will cause it to bleed by itching.  [name] would prefer that this area be revised.  [name] denies any overlying ulcerations.  Fistula is functioning well.  [name] dialyzes on a Monday Wednesday Friday schedule at the [facility] location in [place].   Past Medical History:  Diagnosis Date   Acute kidney injury ([place]) 12/28/2014   Adjustment disorder with mixed anxiety and depressed mood 12/28/2014   CPAP (continuous positive airway pressure) dependence    Diabetes mellitus without complication ([place])    DM (diabetes mellitus) type II controlled with renal manifestation ([place]) 12/28/2014   DM (diabetes mellitus) type II controlled, neurological manifestation ([place]) 12/28/2014   Encephalopathy, hypertensive 12/28/2014   Hypertension    Hypertensive emergency without congestive heart failure 12/27/2014   Possible Panic disorder 12/28/2014   Renal disorder    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   Sleep apnea     Past Surgical History:  Procedure Laterality Date   AV FISTULA PLACEMENT Left 01/05/2020   Procedure: LEFT ARM BRACHIOCEPHALIC ARTERIOVENOUS (AV) FISTULA;  Surgeon: [name] Mould, MD;  Location: [place];  Service: Vascular;  Laterality: Left;   EMPYEMA DRAINAGE Right 05/17/2017   Procedure: EMPYEMA DRAINAGE AND DECORTICATION;  Surgeon: [name] Isaac, MD;  Location: [place];  Service: Thoracic;  Laterality: Right;   FLEXIBLE BRONCHOSCOPY   05/17/2017   Procedure: FLEXIBLE BRONCHOSCOPY;  Surgeon: [name] Isaac, MD;  Location: [place];  Service: Thoracic;;   IR FLUORO GUIDE CV LINE RIGHT  01/01/2020   IR THORACENTESIS ASP PLEURAL SPACE W/IMG GUIDE  05/17/2017   IR US GUIDE VASC ACCESS RIGHT  01/01/2020   NO PAST SURGERIES     THORACOTOMY/LOBECTOMY Right 05/17/2017   Procedure: RIGHT MINI THORACOTOMY;  Surgeon: [name] Isaac, MD;  Location: [place];  Service: Thoracic;  Laterality: Right;   VIDEO ASSISTED THORACOSCOPY (VATS)/EMPYEMA Right 05/17/2017   Procedure: RIGHT VIDEO ASSISTED THORACOSCOPY (VATS)/EMPYEMA;  Surgeon: [name] Isaac, MD;  [facility];  Service: Thoracic;  Laterality: Right;    Social History   Socioeconomic History   Marital status: Married    Spouse name: Not on file   Number of children: Not on file   Years of education: Not on file   Highest education level: Not on file  Occupational History   Not on file  Tobacco Use   Smoking status: Never    Passive exposure: Never   Smokeless tobacco: Never  Vaping Use   Vaping Use: Never used  Substance and Sexual Activity   Alcohol use: No   Drug use: No   Sexual activity: Not on file  Other Topics Concern   Not on file  Social History Narrative   Not on file   Social Determinants of Health   Financial Resource Strain: Not on file  Food Insecurity: Not on file  Transportation Needs: Not on file  Physical Activity: Not on file  Stress: Not on file  Social Connections: Not on file  Intimate Partner Violence: Not on file    Family History  Problem Relation Age of Onset   Hypertension Mother     Current Outpatient Medications  Medication Sig Dispense Refill   Accu-Chek Softclix Lancets lancets Use as directed.  For insulin-dependent type 2 diabetes Monitor  glucose 3 times a day 100 each 0   acetaminophen (TYLENOL) 500 MG tablet Take 1,000-1,500 mg by mouth 2 (two) times daily as needed (pain).     albuterol (PROVENTIL HFA;VENTOLIN  HFA) 108 (90 BASE) MCG/ACT inhaler Inhale 2 puffs into the lungs every 6 (six) hours as needed for wheezing or shortness of breath.      amLODipine (NORVASC) 10 MG tablet Take 1 tablet (10 mg total) by mouth daily. 30 tablet 0   apixaban (ELIQUIS) 5 MG TABS tablet Take 5 mg by mouth 2 (two) times daily.     calcitRIOL (ROCALTROL) 0.25 MCG capsule Take 1 capsule (0.25 mcg total) by mouth every Monday, Wednesday, and Friday. 12 capsule 0   carvedilol (COREG) 25 MG tablet Take 1 tablet (25 mg total) by mouth 2 (two) times daily with a meal. 60 tablet 0   fluticasone (FLONASE) 50 MCG/ACT nasal spray Place 1 spray into both nostrils 2 (two) times daily.     gabapentin (NEURONTIN) 300 MG capsule Take 1 capsule (300 mg total) by mouth daily. 30 capsule 0   glucose blood test strip Use as instructed For insulin-dependent type 2 diabetes Monitor  glucose 3 times a day 100 each 0   HYDROcodone-acetaminophen (NORCO) 7.5-325 MG tablet Take 1 tablet by mouth every 4 (four) hours as needed.     insulin NPH-regular Human (70-30) 100 UNIT/ML injection Inject 25 Units into the skin 2 (two) times daily before a meal.      levocetirizine (XYZAL) 5 MG tablet Take 5 mg by mouth at bedtime.     multivitamin (RENA-VIT) TABS tablet Take 1 tablet by mouth at bedtime. 30 tablet 0   sevelamer carbonate (RENVELA) 800 MG tablet Take 1 tablet (800 mg total) by mouth 3 (three) times daily with meals. 90 tablet 0   tiZANidine (ZANAFLEX) 4 MG tablet Take 4 mg by mouth every 8 (eight) hours as needed for muscle spasms.   0   traMADol (ULTRAM) 50 MG tablet Take 50 mg by mouth 3 (three) times daily as needed (pain).      Vitamin D, Ergocalciferol, (DRISDOL) 1.25 MG (50000 UNIT) CAPS capsule Take 50,000 Units by mouth every Sunday.     No current facility-administered medications for this visit.    No Known Allergies   REVIEW OF SYSTEMS:   _0  denotes positive finding, _1  denotes negative finding Cardiac  Comments:  Chest  pain or chest pressure:    Shortness of breath upon exertion:    Short of breath when lying flat:    Irregular heart rhythm:        Vascular    Pain in calf, thigh, or hip brought on by ambulation:    Pain in feet at night that wakes you up from your sleep:     Blood clot in your veins:    Leg swelling:         Pulmonary    Oxygen at home:    Productive cough:     Wheezing:         Neurologic    Sudden weakness in arms or legs:  Sudden numbness in arms or legs:     Sudden onset of difficulty speaking or slurred speech:    Temporary loss of vision in one eye:     Problems with dizziness:         Gastrointestinal    Blood in stool:     Vomited blood:         Genitourinary    Burning when urinating:     Blood in urine:        Psychiatric    Major depression:         Hematologic    Bleeding problems:    Problems with blood clotting too easily:        Skin    Rashes or ulcers:        Constitutional    Fever or chills:      PHYSICAL EXAMINATION:  Vitals:   11/30/21 1045  BP: (!) 151/96  Pulse: 75  Resp: 20  Temp: 97.6 F (36.4 C)  TempSrc: Temporal  SpO2: 98%  Weight: 237 lb (107.5 kg)  Height: _0  (1.702 m)    General:  WDWN in NAD; vital signs documented above Gait: Not observed HENT: WNL, normocephalic Pulmonary: normal non-labored breathing , without Rales, rhonchi,  wheezing Cardiac: regular HR Abdomen: soft, NT, no masses Skin: without rashes Vascular Exam/Pulses:  Right Left  Radial 2+ (normal) 2+ (normal)   Extremities: Palpable thrill throughout left arm brachiocephalic fistula; large aneurysmal area near the arterial anastomosis without overlying wounds Musculoskeletal: no muscle wasting or atrophy  Neurologic: A&O X 3;  No focal weakness or paresthesias are detected Psychiatric:  The pt has Normal affect.   ASSESSMENT/PLAN:: 56 y.o. 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  No overlying wounds or risk for spontaneous hemorrhage.  I offered the patient 2 options.  One would be to leave the fistula alone and allow dialysis to continue to access aneurysmal area.  Option 2 would be to revise the aneurysmal area by plication.  Patient reports severe pain in this area that last 24 hours after cannulation.  [name] also reports a lot of itching in this area and [name] is concerned [name] will eventually cause a wound by itching over time.  [name] would prefer surgical revision.  [name] is aware there is risk of need for additional procedures, thrombosis of fistula, and possible TDC placement if there is not enough of a access stone after surgery.  Plan will be for revision of left arm brachiocephalic fistula by plication and possible TDC placement.  This will be performed by Dr. Scot [name] on a nondialysis day in the near future.   Dagoberto Ligas, PA-C [facility] [phone]  Clinic MD:   [name]

## 2021-11-30 NOTE — H&P (View-Only) (Signed)
Office Note     CC:  follow up Requesting Provider:  Bartholome Bill, MD  HPI: [name] is a 56 y.o. 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  This was created by Dr. Scot Dock in September 2021.  He has had outflow venoplasty at [facility] 2-3 times since then.  He states the aneurysmal area is painful especially when cannulating.  This pain is moderate to severe and lasts about 24 hours after dialysis.  He also experiences a lot of itching in this area and is concerned he will cause it to bleed by itching.  He would prefer that this area be revised.  He denies any overlying ulcerations.  Fistula is functioning well.  He dialyzes on a Monday Wednesday Friday schedule at the [facility] location in [place].   Past Medical History:  Diagnosis Date   Acute kidney injury ([place]) 12/28/2014   Adjustment disorder with mixed anxiety and depressed mood 12/28/2014   CPAP (continuous positive airway pressure) dependence    Diabetes mellitus without complication ([place])    DM (diabetes mellitus) type II controlled with renal manifestation ([place]) 12/28/2014   DM (diabetes mellitus) type II controlled, neurological manifestation ([place]) 12/28/2014   Encephalopathy, hypertensive 12/28/2014   Hypertension    Hypertensive emergency without congestive heart failure 12/27/2014   Possible Panic disorder 12/28/2014   Renal disorder    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   Sleep apnea     Past Surgical History:  Procedure Laterality Date   AV FISTULA PLACEMENT Left 01/05/2020   Procedure: LEFT ARM BRACHIOCEPHALIC ARTERIOVENOUS (AV) FISTULA;  Surgeon: Angelia Mould, MD;  Location: [place];  Service: Vascular;  Laterality: Left;   EMPYEMA DRAINAGE Right 05/17/2017   Procedure: EMPYEMA DRAINAGE AND DECORTICATION;  Surgeon: Grace Isaac, MD;  Location: [place];  Service: Thoracic;  Laterality: Right;   FLEXIBLE BRONCHOSCOPY   05/17/2017   Procedure: FLEXIBLE BRONCHOSCOPY;  Surgeon: Grace Isaac, MD;  Location: [place];  Service: Thoracic;;   IR FLUORO GUIDE CV LINE RIGHT  01/01/2020   IR THORACENTESIS ASP PLEURAL SPACE W/IMG GUIDE  05/17/2017   IR US GUIDE VASC ACCESS RIGHT  01/01/2020   NO PAST SURGERIES     THORACOTOMY/LOBECTOMY Right 05/17/2017   Procedure: RIGHT MINI THORACOTOMY;  Surgeon: Grace Isaac, MD;  Location: [place];  Service: Thoracic;  Laterality: Right;   VIDEO ASSISTED THORACOSCOPY (VATS)/EMPYEMA Right 05/17/2017   Procedure: RIGHT VIDEO ASSISTED THORACOSCOPY (VATS)/EMPYEMA;  Surgeon: Grace Isaac, MD;  Location[facility];  Service: Thoracic;  Laterality: Right;    Social History   Socioeconomic History   Marital status: Married    Spouse name: Not on file   Number of children: Not on file   Years of education: Not on file   Highest education level: Not on file  Occupational History   Not on file  Tobacco Use   Smoking status: Never    Passive exposure: Never   Smokeless tobacco: Never  Vaping Use   Vaping Use: Never used  Substance and Sexual Activity   Alcohol use: No   Drug use: No   Sexual activity: Not on file  Other Topics Concern   Not on file  Social History Narrative   Not on file   Social Determinants of Health   Financial Resource Strain: Not on file  Food Insecurity: Not on file  Transportation Needs: Not on file  Physical Activity: Not on file  Stress: Not on file  Social Connections: Not on file  Intimate Partner Violence: Not on file    Family History  Problem Relation Age of Onset   Hypertension Mother     Current Outpatient Medications  Medication Sig Dispense Refill   Accu-Chek Softclix Lancets lancets Use as directed.  For insulin-dependent type 2 diabetes Monitor  glucose 3 times a day 100 each 0   acetaminophen (TYLENOL) 500 MG tablet Take 1,000-1,500 mg by mouth 2 (two) times daily as needed (pain).     albuterol (PROVENTIL HFA;VENTOLIN  HFA) 108 (90 BASE) MCG/ACT inhaler Inhale 2 puffs into the lungs every 6 (six) hours as needed for wheezing or shortness of breath.      amLODipine (NORVASC) 10 MG tablet Take 1 tablet (10 mg total) by mouth daily. 30 tablet 0   apixaban (ELIQUIS) 5 MG TABS tablet Take 5 mg by mouth 2 (two) times daily.     calcitRIOL (ROCALTROL) 0.25 MCG capsule Take 1 capsule (0.25 mcg total) by mouth every Monday, Wednesday, and Friday. 12 capsule 0   carvedilol (COREG) 25 MG tablet Take 1 tablet (25 mg total) by mouth 2 (two) times daily with a meal. 60 tablet 0   fluticasone (FLONASE) 50 MCG/ACT nasal spray Place 1 spray into both nostrils 2 (two) times daily.     gabapentin (NEURONTIN) 300 MG capsule Take 1 capsule (300 mg total) by mouth daily. 30 capsule 0   glucose blood test strip Use as instructed For insulin-dependent type 2 diabetes Monitor  glucose 3 times a day 100 each 0   HYDROcodone-acetaminophen (NORCO) 7.5-325 MG tablet Take 1 tablet by mouth every 4 (four) hours as needed.     insulin NPH-regular Human (70-30) 100 UNIT/ML injection Inject 25 Units into the skin 2 (two) times daily before a meal.      levocetirizine (XYZAL) 5 MG tablet Take 5 mg by mouth at bedtime.     multivitamin (RENA-VIT) TABS tablet Take 1 tablet by mouth at bedtime. 30 tablet 0   sevelamer carbonate (RENVELA) 800 MG tablet Take 1 tablet (800 mg total) by mouth 3 (three) times daily with meals. 90 tablet 0   tiZANidine (ZANAFLEX) 4 MG tablet Take 4 mg by mouth every 8 (eight) hours as needed for muscle spasms.   0   traMADol (ULTRAM) 50 MG tablet Take 50 mg by mouth 3 (three) times daily as needed (pain).      Vitamin D, Ergocalciferol, (DRISDOL) 1.25 MG (50000 UNIT) CAPS capsule Take 50,000 Units by mouth every Sunday.     No current facility-administered medications for this visit.    No Known Allergies   REVIEW OF SYSTEMS:   _0  denotes positive finding, _1  denotes negative finding Cardiac  Comments:  Chest  pain or chest pressure:    Shortness of breath upon exertion:    Short of breath when lying flat:    Irregular heart rhythm:        Vascular    Pain in calf, thigh, or hip brought on by ambulation:    Pain in feet at night that wakes you up from your sleep:     Blood clot in your veins:    Leg swelling:         Pulmonary    Oxygen at home:    Productive cough:     Wheezing:         Neurologic    Sudden weakness in arms or legs:  Sudden numbness in arms or legs:     Sudden onset of difficulty speaking or slurred speech:    Temporary loss of vision in one eye:     Problems with dizziness:         Gastrointestinal    Blood in stool:     Vomited blood:         Genitourinary    Burning when urinating:     Blood in urine:        Psychiatric    Major depression:         Hematologic    Bleeding problems:    Problems with blood clotting too easily:        Skin    Rashes or ulcers:        Constitutional    Fever or chills:      PHYSICAL EXAMINATION:  Vitals:   11/30/21 1045  BP: (!) 151/96  Pulse: 75  Resp: 20  Temp: 97.6 F (36.4 C)  TempSrc: Temporal  SpO2: 98%  Weight: 237 lb (107.5 kg)  Height: _0  (1.702 m)    General:  WDWN in NAD; vital signs documented above Gait: Not observed HENT: WNL, normocephalic Pulmonary: normal non-labored breathing , without Rales, rhonchi,  wheezing Cardiac: regular HR Abdomen: soft, NT, no masses Skin: without rashes Vascular Exam/Pulses:  Right Left  Radial 2+ (normal) 2+ (normal)   Extremities: Palpable thrill throughout left arm brachiocephalic fistula; large aneurysmal area near the arterial anastomosis without overlying wounds Musculoskeletal: no muscle wasting or atrophy  Neurologic: A&O X 3;  No focal weakness or paresthesias are detected Psychiatric:  The pt has Normal affect.   ASSESSMENT/PLAN:: 56 y.o. 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  No overlying wounds or risk for spontaneous hemorrhage.  I offered the patient 2 options.  One would be to leave the fistula alone and allow dialysis to continue to access aneurysmal area.  Option 2 would be to revise the aneurysmal area by plication.  Patient reports severe pain in this area that last 24 hours after cannulation.  He also reports a lot of itching in this area and he is concerned he will eventually cause a wound by itching over time.  He would prefer surgical revision.  He is aware there is risk of need for additional procedures, thrombosis of fistula, and possible TDC placement if there is not enough of a access stone after surgery.  Plan will be for revision of left arm brachiocephalic fistula by plication and possible TDC placement.  This will be performed by Dr. Scot Dock on a nondialysis day in the near future.   Dagoberto Ligas, PA-C [facility] [phone]  Clinic MD:   Scot Dock

## 2021-12-07 ENCOUNTER — Other Ambulatory Visit: Payer: Self-pay

## 2021-12-07 ENCOUNTER — Encounter (HOSPITAL_COMMUNITY): Payer: Self-pay | Admitting: Vascular Surgery

## 2021-12-07 NOTE — Progress Notes (Addendum)
Mr. Isensee denies chest pian or shortness of breath. Patient denies having any s/s of Covid in his household, also denies any known exposure to Covid.   Mr.Runyon has type II diabetes. Patient reports that am blood sugars run around 120. I instructed Mr. Stuller to take 10 units on Monday dinner dose, no 70/30 Insulin the morning of surgery. I instructed patient to check CBG after awaking and every 2 hours until arrival  to the hospital. I Instructed patient if CBG is less than 70 to take 4 Glucose Tablets or 1 tube of Glucose Gel or 1/2 cup of a clear juice. Recheck CBG in 15 minutes if CBG is not over 70 call, pre- op desk at [phone] for further instructions.  Mr Brower's PCP is Dr. Precious Haws, cardiologist is Dr. Neva Seat.

## 2021-12-11 NOTE — Anesthesia Preprocedure Evaluation (Addendum)
Anesthesia Evaluation  Patient identified by MRN, date of birth, ID band Patient awake    Airway Mallampati: II       Dental   Pulmonary    breath sounds clear to auscultation       Cardiovascular hypertension, +CHF   Rhythm:Regular Rate:Normal     Neuro/Psych PSYCHIATRIC DISORDERS  Neuromuscular disease    GI/Hepatic GERD  ,  Endo/Other  diabetes  Renal/GU Renal disease     Musculoskeletal   Abdominal   Peds  Hematology   Anesthesia Other Findings   Reproductive/Obstetrics                           Anesthesia Physical Anesthesia Plan  ASA: 3  Anesthesia Plan: MAC   Post-op Pain Management:    Induction: Intravenous  PONV Risk Score and Plan: 3 and Treatment may vary due to age or medical condition, Ondansetron and Propofol infusion  Airway Management Planned: Nasal Cannula and Simple Face Mask  Additional Equipment:   Intra-op Plan:   Post-operative Plan:   Informed Consent: I have reviewed the patients History and Physical, chart, labs and discussed the procedure including the risks, benefits and alternatives for the proposed anesthesia with the patient or authorized representative who has indicated his/her understanding and acceptance.     Dental advisory given  Plan Discussed with: Anesthesiologist and CRNA  Anesthesia Plan Comments:       Anesthesia Quick Evaluation

## 2021-12-12 ENCOUNTER — Ambulatory Visit: Payer: Medicare Other | Admitting: Podiatry

## 2021-12-12 ENCOUNTER — Ambulatory Visit (HOSPITAL_COMMUNITY): Payer: Medicare Other

## 2021-12-12 ENCOUNTER — Other Ambulatory Visit: Payer: Self-pay

## 2021-12-12 ENCOUNTER — Ambulatory Visit (HOSPITAL_BASED_OUTPATIENT_CLINIC_OR_DEPARTMENT_OTHER): Payer: Medicare Other | Admitting: Anesthesiology

## 2021-12-12 ENCOUNTER — Encounter (HOSPITAL_COMMUNITY): Payer: Self-pay | Admitting: Vascular Surgery

## 2021-12-12 ENCOUNTER — Ambulatory Visit (HOSPITAL_COMMUNITY): Payer: Medicare Other | Admitting: Anesthesiology

## 2021-12-12 ENCOUNTER — Ambulatory Visit (HOSPITAL_COMMUNITY)
Admission: RE | Admit: 2021-12-12 | Discharge: 2021-12-12 | Disposition: A | Discharge status: Home / Self Care | Payer: Medicare Other | Attending: Vascular Surgery | Admitting: Vascular Surgery

## 2021-12-12 ENCOUNTER — Encounter (HOSPITAL_COMMUNITY)
Admission: RE | Disposition: A | Discharge status: Home / Self Care | Payer: Self-pay | Source: Home / Self Care | Attending: Vascular Surgery

## 2021-12-12 DIAGNOSIS — Z992 Dependence on renal dialysis: Secondary | ICD-10-CM | POA: Insufficient documentation

## 2021-12-12 DIAGNOSIS — T82510A Breakdown (mechanical) of surgically created arteriovenous fistula, initial encounter: Secondary | ICD-10-CM | POA: Diagnosis present

## 2021-12-12 DIAGNOSIS — N186 End stage renal disease: Secondary | ICD-10-CM

## 2021-12-12 DIAGNOSIS — Z794 Long term (current) use of insulin: Secondary | ICD-10-CM

## 2021-12-12 DIAGNOSIS — E1122 Type 2 diabetes mellitus with diabetic chronic kidney disease: Secondary | ICD-10-CM | POA: Diagnosis not present

## 2021-12-12 DIAGNOSIS — I770 Arteriovenous fistula, acquired: Secondary | ICD-10-CM

## 2021-12-12 DIAGNOSIS — N189 Chronic kidney disease, unspecified: Secondary | ICD-10-CM | POA: Diagnosis not present

## 2021-12-12 DIAGNOSIS — I132 Hypertensive heart and chronic kidney disease with heart failure and with stage 5 chronic kidney disease, or end stage renal disease: Secondary | ICD-10-CM

## 2021-12-12 DIAGNOSIS — T82898A Other specified complication of vascular prosthetic devices, implants and grafts, initial encounter: Secondary | ICD-10-CM | POA: Diagnosis not present

## 2021-12-12 DIAGNOSIS — I509 Heart failure, unspecified: Secondary | ICD-10-CM | POA: Diagnosis not present

## 2021-12-12 DIAGNOSIS — N185 Chronic kidney disease, stage 5: Secondary | ICD-10-CM | POA: Diagnosis not present

## 2021-12-12 DIAGNOSIS — Y832 Surgical operation with anastomosis, bypass or graft as the cause of abnormal reaction of the patient, or of later complication, without mention of misadventure at the time of the procedure: Secondary | ICD-10-CM | POA: Diagnosis not present

## 2021-12-12 DIAGNOSIS — I5033 Acute on chronic diastolic (congestive) heart failure: Secondary | ICD-10-CM | POA: Diagnosis not present

## 2021-12-12 DIAGNOSIS — Z8249 Family history of ischemic heart disease and other diseases of the circulatory system: Secondary | ICD-10-CM | POA: Insufficient documentation

## 2021-12-12 HISTORY — DX: Pneumonia, unspecified organism: J18.9

## 2021-12-12 HISTORY — DX: Heart failure, unspecified: I50.9

## 2021-12-12 HISTORY — PX: INSERTION OF DIALYSIS CATHETER: SHX1324

## 2021-12-12 HISTORY — PX: REVISON OF ARTERIOVENOUS FISTULA: SHX6074

## 2021-12-12 HISTORY — DX: Gastro-esophageal reflux disease without esophagitis: K21.9

## 2021-12-12 LAB — GLUCOSE, CAPILLARY
Glucose-Capillary: 142 mg/dL — ABNORMAL HIGH (ref 70–99)
Glucose-Capillary: 152 mg/dL — ABNORMAL HIGH (ref 70–99)

## 2021-12-12 LAB — SURGICAL PCR SCREEN
MRSA, PCR: NEGATIVE
Staphylococcus aureus: NEGATIVE

## 2021-12-12 LAB — POCT I-STAT, CHEM 8
BUN: 69 mg/dL — ABNORMAL HIGH (ref 6–20)
Calcium, Ion: 1.11 mmol/L — ABNORMAL LOW (ref 1.15–1.40)
Chloride: 98 mmol/L (ref 98–111)
Creatinine, Ser: 15.1 mg/dL — ABNORMAL HIGH (ref 0.61–1.24)
Glucose, Bld: 150 mg/dL — ABNORMAL HIGH (ref 70–99)
HCT: 33 % — ABNORMAL LOW (ref 39.0–52.0)
Hemoglobin: 11.2 g/dL — ABNORMAL LOW (ref 13.0–17.0)
Potassium: 3.8 mmol/L (ref 3.5–5.1)
Sodium: 136 mmol/L (ref 135–145)
TCO2: 27 mmol/L (ref 22–32)

## 2021-12-12 SURGERY — REVISON OF ARTERIOVENOUS FISTULA
Anesthesia: Monitor Anesthesia Care | Site: Neck | Laterality: Right

## 2021-12-12 MED ORDER — CHLORHEXIDINE GLUCONATE 4 % EX LIQD: 60.0000 mL | Freq: Once | CUTANEOUS | Status: DC

## 2021-12-12 MED ORDER — CHLORHEXIDINE GLUCONATE 0.12 % MT SOLN: 15.0000 mL | Freq: Once | OROMUCOSAL | Status: AC

## 2021-12-12 MED ORDER — HEPARIN SODIUM (PORCINE) 1000 UNIT/ML IJ SOLN
INTRAMUSCULAR | Status: AC
  Filled 2021-12-12: qty 10

## 2021-12-12 MED ORDER — DEXAMETHASONE SODIUM PHOSPHATE 10 MG/ML IJ SOLN
INTRAMUSCULAR | Status: DC | PRN
  Administered 2021-12-12: 4 mg via INTRAVENOUS

## 2021-12-12 MED ORDER — HEPARIN 6000 UNIT IRRIGATION SOLUTION
Status: DC | PRN
  Administered 2021-12-12: 1

## 2021-12-12 MED ORDER — LIDOCAINE 2% (20 MG/ML) 5 ML SYRINGE
INTRAMUSCULAR | Status: AC
  Filled 2021-12-12: qty 5

## 2021-12-12 MED ORDER — PROTAMINE SULFATE 10 MG/ML IV SOLN
INTRAVENOUS | Status: DC | PRN
  Administered 2021-12-12: 50 mg via INTRAVENOUS

## 2021-12-12 MED ORDER — FENTANYL CITRATE (PF) 250 MCG/5ML IJ SOLN
INTRAMUSCULAR | Status: AC
  Filled 2021-12-12: qty 5

## 2021-12-12 MED ORDER — MIDAZOLAM HCL 2 MG/2ML IJ SOLN
INTRAMUSCULAR | Status: AC
  Filled 2021-12-12: qty 2

## 2021-12-12 MED ORDER — CHLORHEXIDINE GLUCONATE 0.12 % MT SOLN
OROMUCOSAL | Status: AC
  Administered 2021-12-12: 15 mL via OROMUCOSAL
  Filled 2021-12-12: qty 15

## 2021-12-12 MED ORDER — HEPARIN SODIUM (PORCINE) 1000 UNIT/ML IJ SOLN
INTRAMUSCULAR | Status: DC | PRN
  Administered 2021-12-12: 10000 [IU] via INTRAVENOUS

## 2021-12-12 MED ORDER — LIDOCAINE-EPINEPHRINE (PF) 1 %-1:200000 IJ SOLN
INTRAMUSCULAR | Status: DC | PRN
  Administered 2021-12-12: 15 mL

## 2021-12-12 MED ORDER — HEPARIN SODIUM (PORCINE) 1000 UNIT/ML IJ SOLN
INTRAMUSCULAR | Status: DC | PRN
  Administered 2021-12-12: 1000 [IU]

## 2021-12-12 MED ORDER — PROPOFOL 10 MG/ML IV BOLUS
INTRAVENOUS | Status: DC | PRN
  Administered 2021-12-12: 150 mg via INTRAVENOUS
  Administered 2021-12-12: 50 mg via INTRAVENOUS

## 2021-12-12 MED ORDER — 0.9 % SODIUM CHLORIDE (POUR BTL) OPTIME
TOPICAL | Status: DC | PRN
  Administered 2021-12-12: 1000 mL

## 2021-12-12 MED ORDER — OXYCODONE-ACETAMINOPHEN 5-325 MG PO TABS: 1.0000 | ORAL_TABLET | ORAL | 0 refills | Status: DC | PRN

## 2021-12-12 MED ORDER — PHENYLEPHRINE 80 MCG/ML (10ML) SYRINGE FOR IV PUSH (FOR BLOOD PRESSURE SUPPORT)
PREFILLED_SYRINGE | INTRAVENOUS | Status: AC
  Filled 2021-12-12: qty 10

## 2021-12-12 MED ORDER — LIDOCAINE 2% (20 MG/ML) 5 ML SYRINGE
INTRAMUSCULAR | Status: DC | PRN
  Administered 2021-12-12: 100 mg via INTRAVENOUS

## 2021-12-12 MED ORDER — LIDOCAINE HCL (PF) 1 % IJ SOLN
INTRAMUSCULAR | Status: AC
  Filled 2021-12-12: qty 30

## 2021-12-12 MED ORDER — FENTANYL CITRATE (PF) 100 MCG/2ML IJ SOLN
INTRAMUSCULAR | Status: DC | PRN
  Administered 2021-12-12 (×2): 25 ug via INTRAVENOUS

## 2021-12-12 MED ORDER — ONDANSETRON HCL 4 MG/2ML IJ SOLN
INTRAMUSCULAR | Status: AC
  Filled 2021-12-12: qty 2

## 2021-12-12 MED ORDER — PHENYLEPHRINE 80 MCG/ML (10ML) SYRINGE FOR IV PUSH (FOR BLOOD PRESSURE SUPPORT)
PREFILLED_SYRINGE | INTRAVENOUS | Status: DC | PRN
  Administered 2021-12-12: 80 ug via INTRAVENOUS
  Administered 2021-12-12: 160 ug via INTRAVENOUS
  Administered 2021-12-12: 80 ug via INTRAVENOUS

## 2021-12-12 MED ORDER — ONDANSETRON HCL 4 MG/2ML IJ SOLN
INTRAMUSCULAR | Status: DC | PRN
  Administered 2021-12-12: 4 mg via INTRAVENOUS

## 2021-12-12 MED ORDER — HYDROMORPHONE HCL 1 MG/ML IJ SOLN
0.2500 mg | INTRAMUSCULAR | Status: DC | PRN
  Administered 2021-12-12: 0.5 mg via INTRAVENOUS

## 2021-12-12 MED ORDER — CEFAZOLIN SODIUM-DEXTROSE 2-4 GM/100ML-% IV SOLN
2.0000 g | INTRAVENOUS | Status: AC
  Administered 2021-12-12: 2 g via INTRAVENOUS
  Filled 2021-12-12: qty 100

## 2021-12-12 MED ORDER — LACTATED RINGERS IV SOLN: INTRAVENOUS | Status: DC

## 2021-12-12 MED ORDER — SODIUM CHLORIDE 0.9 % IV SOLN: INTRAVENOUS | Status: DC

## 2021-12-12 MED ORDER — HEPARIN SODIUM (PORCINE) 1000 UNIT/ML IJ SOLN
INTRAMUSCULAR | Status: AC
Start: 2021-12-12 — End: ?
  Filled 2021-12-12: qty 10

## 2021-12-12 MED ORDER — HEPARIN 6000 UNIT IRRIGATION SOLUTION
Status: AC
  Filled 2021-12-12: qty 500

## 2021-12-12 MED ORDER — MIDAZOLAM HCL 5 MG/5ML IJ SOLN
INTRAMUSCULAR | Status: DC | PRN
  Administered 2021-12-12: 2 mg via INTRAVENOUS

## 2021-12-12 MED ORDER — ORAL CARE MOUTH RINSE: 15.0000 mL | Freq: Once | OROMUCOSAL | Status: AC

## 2021-12-12 MED ORDER — HYDROMORPHONE HCL 1 MG/ML IJ SOLN
INTRAMUSCULAR | Status: AC
  Filled 2021-12-12: qty 1

## 2021-12-12 MED ORDER — DEXAMETHASONE SODIUM PHOSPHATE 10 MG/ML IJ SOLN
INTRAMUSCULAR | Status: AC
Start: 2021-12-12 — End: ?
  Filled 2021-12-12: qty 1

## 2021-12-12 MED ORDER — LIDOCAINE-EPINEPHRINE (PF) 1 %-1:200000 IJ SOLN
INTRAMUSCULAR | Status: AC
  Filled 2021-12-12: qty 30

## 2021-12-12 SURGICAL SUPPLY — 58 items
ARMBAND PINK RESTRICT EXTREMIT (MISCELLANEOUS) ×2 IMPLANT
BAG COUNTER SPONGE SURGICOUNT (BAG) ×4 IMPLANT
BAG DECANTER FOR FLEXI CONT (MISCELLANEOUS) ×2 IMPLANT
BIOPATCH RED 1 DISK 7.0 (GAUZE/BANDAGES/DRESSINGS) ×2 IMPLANT
CANISTER SUCT 3000ML PPV (MISCELLANEOUS) ×2 IMPLANT
CANNULA VESSEL 3MM 2 BLNT TIP (CANNULA) ×2 IMPLANT
CATH PALINDROME-P 19CM W/VT (CATHETERS) IMPLANT
CATH PALINDROME-P 23CM W/VT (CATHETERS) IMPLANT
CATH PALINDROME-P 28CM W/VT (CATHETERS) IMPLANT
CHLORAPREP W/TINT 26 (MISCELLANEOUS) ×2 IMPLANT
CLIP VESOCCLUDE MED 6/CT (CLIP) ×2 IMPLANT
CLIP VESOCCLUDE SM WIDE 6/CT (CLIP) ×2 IMPLANT
COVER PROBE W GEL 5X96 (DRAPES) IMPLANT
COVER SURGICAL LIGHT HANDLE (MISCELLANEOUS) ×2 IMPLANT
DERMABOND ADVANCED (GAUZE/BANDAGES/DRESSINGS) ×2
DERMABOND ADVANCED .7 DNX12 (GAUZE/BANDAGES/DRESSINGS) ×2 IMPLANT
DRAPE C-ARM 42X72 X-RAY (DRAPES) ×2 IMPLANT
DRAPE CHEST BREAST 15X10 FENES (DRAPES) ×2 IMPLANT
DRSG COVADERM 4X6 (GAUZE/BANDAGES/DRESSINGS) IMPLANT
ELECT REM PT RETURN 9FT ADLT (ELECTROSURGICAL) ×2
ELECTRODE REM PT RTRN 9FT ADLT (ELECTROSURGICAL) ×2 IMPLANT
GAUZE 4X4 16PLY ~~LOC~~+RFID DBL (SPONGE) ×2 IMPLANT
GLOVE BIO SURGEON STRL SZ7.5 (GLOVE) ×4 IMPLANT
GLOVE BIOGEL PI IND STRL 8 (GLOVE) ×4 IMPLANT
GLOVE SURG POLY ORTHO LF SZ7.5 (GLOVE) IMPLANT
GLOVE SURG UNDER LTX SZ8 (GLOVE) ×4 IMPLANT
GOWN STRL REUS W/ TWL LRG LVL3 (GOWN DISPOSABLE) ×10 IMPLANT
GOWN STRL REUS W/TWL LRG LVL3 (GOWN DISPOSABLE) ×10
KIT BASIN OR (CUSTOM PROCEDURE TRAY) ×4 IMPLANT
KIT PALINDROME-P 55CM (CATHETERS) IMPLANT
KIT TURNOVER KIT B (KITS) ×4 IMPLANT
NDL 18GX1X1/2 (RX/OR ONLY) (NEEDLE) ×2 IMPLANT
NDL HYPO 25GX1X1/2 BEV (NEEDLE) ×2 IMPLANT
NEEDLE 18GX1X1/2 (RX/OR ONLY) (NEEDLE) ×2 IMPLANT
NEEDLE HYPO 25GX1X1/2 BEV (NEEDLE) ×2 IMPLANT
NS IRRIG 1000ML POUR BTL (IV SOLUTION) ×4 IMPLANT
PACK BASIC III (CUSTOM PROCEDURE TRAY)
PACK CV ACCESS (CUSTOM PROCEDURE TRAY) ×2 IMPLANT
PACK SRG BSC III STRL LF ECLPS (CUSTOM PROCEDURE TRAY) ×2 IMPLANT
PAD ARMBOARD 7.5X6 YLW CONV (MISCELLANEOUS) ×8 IMPLANT
SET MICROPUNCTURE 5F STIFF (MISCELLANEOUS) IMPLANT
SLING ARM FOAM STRAP LRG (SOFTGOODS) IMPLANT
SLING ARM FOAM STRAP MED (SOFTGOODS) IMPLANT
SPONGE SURGIFOAM ABS GEL 100 (HEMOSTASIS) IMPLANT
SUT ETHILON 3 0 PS 1 (SUTURE) ×2 IMPLANT
SUT MNCRL AB 4-0 PS2 18 (SUTURE) ×4 IMPLANT
SUT PROLENE 5 0 C 1 24 (SUTURE) IMPLANT
SUT PROLENE 6 0 BV (SUTURE) ×2 IMPLANT
SUT VIC AB 3-0 SH 27 (SUTURE) ×2
SUT VIC AB 3-0 SH 27X BRD (SUTURE) ×2 IMPLANT
SYR 10ML LL (SYRINGE) ×2 IMPLANT
SYR 20ML LL LF (SYRINGE) ×4 IMPLANT
SYR 5ML LL (SYRINGE) ×4 IMPLANT
SYR CONTROL 10ML LL (SYRINGE) ×2 IMPLANT
TOWEL GREEN STERILE (TOWEL DISPOSABLE) ×6 IMPLANT
TOWEL GREEN STERILE FF (TOWEL DISPOSABLE) ×2 IMPLANT
UNDERPAD 30X36 HEAVY ABSORB (UNDERPADS AND DIAPERS) ×2 IMPLANT
WATER STERILE IRR 1000ML POUR (IV SOLUTION) ×4 IMPLANT

## 2021-12-12 NOTE — Interval H&P Note (Signed)
History and Physical Interval Note:  12/12/2021 7:03 AM  [name]  has presented today for surgery, with the diagnosis of ESRD.  The various methods of treatment have been discussed with the patient and family. After consideration of risks, benefits and other options for treatment, the patient has consented to  Procedure(s): REVISION OF LEFT ARM FISTULA BY PLICATION (Left) as a surgical intervention.  The patient's history has been reviewed, patient examined, no change in status, stable for surgery.  I have reviewed the patient's chart and labs.  Questions were answered to the patient's satisfaction.     [name]

## 2021-12-12 NOTE — Anesthesia Postprocedure Evaluation (Signed)
Anesthesia Post Note  Patient: [name]  Procedure(s) Performed: REVISION OF LEFT ARM FISTULA BY PLICATION (Left: Arm Upper) INSERTION OF RIGHT INTERNAL JUGULAR DIALYSIS CATHETER (Right: Neck)     Patient location during evaluation: PACU Anesthesia Type: General Level of consciousness: awake Pain management: pain level controlled Vital Signs Assessment: post-procedure vital signs reviewed and stable Respiratory status: spontaneous breathing Cardiovascular status: stable Postop Assessment: no apparent nausea or vomiting Anesthetic complications: no   No notable events documented.  Last Vitals:  Vitals:   12/12/21 1100 12/12/21 1115  BP: (!) 179/104 (!) 171/90  Pulse: 80 78  Resp: 13 15  Temp:  36.4 C  SpO2: 93% 95%    Last Pain:  Vitals:   12/12/21 1115  PainSc: 0-No pain                 Lynnmarie Lovett

## 2021-12-12 NOTE — Transfer of Care (Signed)
Immediate Anesthesia Transfer of Care Note  Patient: [name]  Procedure(s) Performed: REVISION OF LEFT ARM FISTULA BY PLICATION (Left: Arm Upper) INSERTION OF RIGHT INTERNAL JUGULAR DIALYSIS CATHETER (Right: Neck)  Patient Location: PACU  Anesthesia Type:General  Level of Consciousness: awake, alert  and oriented  Airway & Oxygen Therapy: Patient Spontanous Breathing and Patient connected to face mask oxygen  Post-op Assessment: Report given to RN and Post -op Vital signs reviewed and stable  Post vital signs: Reviewed and stable  Last Vitals:  Vitals Value Taken Time  BP 167/99 12/12/21 1000  Temp 36.3 C 12/12/21 1000  Pulse 82 12/12/21 1000  Resp 17 12/12/21 1000  SpO2 99 % 12/12/21 1000    Last Pain:  Vitals:   12/12/21 1000  PainSc: 0-No pain         Complications: No notable events documented.

## 2021-12-12 NOTE — Anesthesia Procedure Notes (Signed)
Procedure Name: LMA Insertion Date/Time: 12/12/2021 7:44 AM  Performed by: Genelle Bal, CRNAPre-anesthesia Checklist: Patient identified, Emergency Drugs available, Suction available and Patient being monitored Patient Re-evaluated:Patient Re-evaluated prior to induction Oxygen Delivery Method: Circle system utilized Preoxygenation: Pre-oxygenation with 100% oxygen Induction Type: IV induction Ventilation: Mask ventilation without difficulty LMA: LMA inserted LMA Size: 5.0 Number of attempts: 2 Airway Equipment and Method: Bite block Placement Confirmation: positive ETCO2 Tube secured with: Tape Dental Injury: Teeth and Oropharynx as per pre-operative assessment

## 2021-12-12 NOTE — Progress Notes (Addendum)
Dr. Nyoka Cowden aware of pt's elevated BP Verbal order to recheck in 10 minutes. BP rechecked at 0710-193/105.pulse 75. Dr. Nyoka Cowden notified by Juanda Chance CRNA of recheck.

## 2021-12-12 NOTE — Progress Notes (Signed)
Paged Dr. Nyoka Cowden to inform her that pt's BP is 195/110 this am. Pulse 79 . Pt  took Amlodipine and Coreg this am.

## 2021-12-12 NOTE — Op Note (Signed)
    NAMEChrystian Cupples [name]    MRN: [id number] DOB: 1965/12/20    DATE OF OPERATION: 12/12/2021  PREOP DIAGNOSIS:    Aneurysmal left upper arm fistula  POSTOP DIAGNOSIS:    Same  PROCEDURE:    Ultrasound-guided placement of right IJ tunneled 23 cm dialysis catheter Plication of left brachiocephalic fistula with revision  SURGEON: Judeth Cornfield. Scot Dock, MD  ASSIST: Arlee Muslim, PA  ANESTHESIA: General  EBL: Minimal  INDICATIONS:    [name] is a 56 y.o. male who presented with an aneurysmal proximal fistula which was painful.  He presents for plication.  I did not think did be room to cannulate the fistula therefore I also recommended placement of a catheter  FINDINGS:   Excellent thrill at the completion of the procedure.  Radial and ulnar signal with the Doppler.  TECHNIQUE:   The patient was taken to the operating room and received a general anesthetic.  Both IJ's appear to be patent using the SonoSite.  The neck and upper chest were prepped and draped in usual sterile fashion.  Under ultrasound guidance, after the skin was anesthetized, I cannulated the right IJ with a micropuncture needle and a micropuncture sheath was introduced over a wire.  I then advanced the J-wire into the right atrium.  The exit site for the catheter was selected and a stab incision was made.  The catheter was brought through the tunnel.  The tract over the wire was dilated and then the dilator and peel-away sheath were advanced over the wire and the wire and dilator removed.  The catheter was passed through the peel-away sheath and positioned at the caval atrial junction.  Both ports withdrew easily with and flushed with heparinized saline and filled with concentrated heparin.  The catheter was secured at its exit site with a 3-0 nylon suture.  The IJ cannulation site was closed with a 4-0 Monocryl.  Sterile dressing was applied.  Attention was turned to the left arm.  A fishmouth incision was outlined  and I infiltrated 1% lidocaine with epinephrine.  An ellipse of skin was excised down to the aneurysm.  The aneurysm was dissected free circumferentially.  I was able to get control proximally and distally and the patient was heparinized.  The fistula was clamped proximally and distally.  I then removed a large ellipse of aneurysmal AV fistula along the lateral aspect of the fistula.  This was then sewn back using two 5-0 Prolene sutures.  The suture line at this point was somewhat lateral.  I used some 5-0 Prolene's to tack this further laterally so that the fistula could be used in 6 weeks.  This would allow cannulation without hitting the suture line.  Hemostasis was obtained in the wound.  The wound was closed with a deep layer of 3-0 Vicryl and the skin closed with 4-0 Monocryl.  Dermabond was applied.  The patient tolerated the procedure well was transferred to the recovery room in stable condition.  All needle and sponge counts were correct.   Given the complexity of the case a first assistant was necessary in order to expedient the procedure and safely perform the technical aspects of the operation.  [name] Mayo, MD, FACS [facility]  DATE OF DICTATION:   12/12/2021

## 2021-12-13 ENCOUNTER — Encounter (HOSPITAL_COMMUNITY): Payer: Self-pay | Admitting: Vascular Surgery

## 2021-12-14 ENCOUNTER — Telehealth: Payer: Self-pay | Admitting: Vascular Surgery

## 2021-12-14 NOTE — Telephone Encounter (Signed)
-----   Message from Angelia Mould, MD sent at 12/12/2021  9:53 AM EDT ----- Regarding: charge and f/u  PROCEDURE:   Ultrasound-guided placement of right IJ tunneled 23 cm dialysis catheter Plication of left brachiocephalic fistula with revision  SURGEON: Judeth Cornfield. Scot Dock, MD  ASSIST: Arlee Muslim, PA  This patient needs a follow-up visit in 6 weeks before they begin using the fistula.  CD

## 2021-12-20 ENCOUNTER — Emergency Department (HOSPITAL_COMMUNITY)
Admission: EM | Admit: 2021-12-20 | Discharge: 2021-12-20 | Disposition: A | Discharge status: Home / Self Care | Payer: Medicare Other | Attending: Emergency Medicine | Admitting: Emergency Medicine

## 2021-12-20 ENCOUNTER — Emergency Department (HOSPITAL_COMMUNITY): Payer: Medicare Other

## 2021-12-20 ENCOUNTER — Other Ambulatory Visit: Payer: Self-pay

## 2021-12-20 ENCOUNTER — Encounter (HOSPITAL_COMMUNITY): Payer: Self-pay

## 2021-12-20 DIAGNOSIS — M542 Cervicalgia: Secondary | ICD-10-CM | POA: Insufficient documentation

## 2021-12-20 DIAGNOSIS — Z7901 Long term (current) use of anticoagulants: Secondary | ICD-10-CM | POA: Diagnosis not present

## 2021-12-20 DIAGNOSIS — Z7984 Long term (current) use of oral hypoglycemic drugs: Secondary | ICD-10-CM | POA: Diagnosis not present

## 2021-12-20 DIAGNOSIS — E1122 Type 2 diabetes mellitus with diabetic chronic kidney disease: Secondary | ICD-10-CM | POA: Insufficient documentation

## 2021-12-20 DIAGNOSIS — I132 Hypertensive heart and chronic kidney disease with heart failure and with stage 5 chronic kidney disease, or end stage renal disease: Secondary | ICD-10-CM | POA: Diagnosis not present

## 2021-12-20 DIAGNOSIS — Z992 Dependence on renal dialysis: Secondary | ICD-10-CM | POA: Insufficient documentation

## 2021-12-20 DIAGNOSIS — H8110 Benign paroxysmal vertigo, unspecified ear: Secondary | ICD-10-CM

## 2021-12-20 DIAGNOSIS — N186 End stage renal disease: Secondary | ICD-10-CM | POA: Insufficient documentation

## 2021-12-20 DIAGNOSIS — Z794 Long term (current) use of insulin: Secondary | ICD-10-CM | POA: Diagnosis not present

## 2021-12-20 DIAGNOSIS — Z79899 Other long term (current) drug therapy: Secondary | ICD-10-CM | POA: Diagnosis not present

## 2021-12-20 DIAGNOSIS — I509 Heart failure, unspecified: Secondary | ICD-10-CM | POA: Insufficient documentation

## 2021-12-20 DIAGNOSIS — T829XXA Unspecified complication of cardiac and vascular prosthetic device, implant and graft, initial encounter: Secondary | ICD-10-CM

## 2021-12-20 LAB — CBC WITH DIFFERENTIAL/PLATELET
Abs Immature Granulocytes: 0.05 10*3/uL (ref 0.00–0.07)
Basophils Absolute: 0.1 10*3/uL (ref 0.0–0.1)
Basophils Relative: 1 %
Eosinophils Absolute: 0.3 10*3/uL (ref 0.0–0.5)
Eosinophils Relative: 3 %
HCT: 33 % — ABNORMAL LOW (ref 39.0–52.0)
Hemoglobin: 10.9 g/dL — ABNORMAL LOW (ref 13.0–17.0)
Immature Granulocytes: 1 %
Lymphocytes Relative: 27 %
Lymphs Abs: 2.8 10*3/uL (ref 0.7–4.0)
MCH: 31.6 pg (ref 26.0–34.0)
MCHC: 33 g/dL (ref 30.0–36.0)
MCV: 95.7 fL (ref 80.0–100.0)
Monocytes Absolute: 0.5 10*3/uL (ref 0.1–1.0)
Monocytes Relative: 5 %
Neutro Abs: 6.7 10*3/uL (ref 1.7–7.7)
Neutrophils Relative %: 63 %
Platelets: 386 10*3/uL (ref 150–400)
RBC: 3.45 MIL/uL — ABNORMAL LOW (ref 4.22–5.81)
RDW: 13 % (ref 11.5–15.5)
WBC: 10.3 10*3/uL (ref 4.0–10.5)
nRBC: 0 % (ref 0.0–0.2)

## 2021-12-20 LAB — I-STAT CHEM 8, ED
BUN: 53 mg/dL — ABNORMAL HIGH (ref 6–20)
Calcium, Ion: 1.12 mmol/L — ABNORMAL LOW (ref 1.15–1.40)
Chloride: 97 mmol/L — ABNORMAL LOW (ref 98–111)
Creatinine, Ser: 14.1 mg/dL — ABNORMAL HIGH (ref 0.61–1.24)
Glucose, Bld: 231 mg/dL — ABNORMAL HIGH (ref 70–99)
HCT: 34 % — ABNORMAL LOW (ref 39.0–52.0)
Hemoglobin: 11.6 g/dL — ABNORMAL LOW (ref 13.0–17.0)
Potassium: 4 mmol/L (ref 3.5–5.1)
Sodium: 133 mmol/L — ABNORMAL LOW (ref 135–145)
TCO2: 27 mmol/L (ref 22–32)

## 2021-12-20 LAB — CBG MONITORING, ED: Glucose-Capillary: 224 mg/dL — ABNORMAL HIGH (ref 70–99)

## 2021-12-20 LAB — BASIC METABOLIC PANEL WITH GFR
Anion gap: 15 (ref 5–15)
BUN: 58 mg/dL — ABNORMAL HIGH (ref 6–20)
CO2: 27 mmol/L (ref 22–32)
Calcium: 9.6 mg/dL (ref 8.9–10.3)
Chloride: 92 mmol/L — ABNORMAL LOW (ref 98–111)
Creatinine, Ser: 12.72 mg/dL — ABNORMAL HIGH (ref 0.61–1.24)
GFR, Estimated: 4 mL/min — ABNORMAL LOW (ref >60)
Glucose, Bld: 230 mg/dL — ABNORMAL HIGH (ref 70–99)
Potassium: 3.9 mmol/L (ref 3.5–5.1)
Sodium: 134 mmol/L — ABNORMAL LOW (ref 135–145)

## 2021-12-20 LAB — TROPONIN I (HIGH SENSITIVITY)
Troponin I (High Sensitivity): 40 ng/L — ABNORMAL HIGH (ref <18)
Troponin I (High Sensitivity): 37 ng/L — ABNORMAL HIGH (ref <18)

## 2021-12-20 MED ORDER — IOHEXOL 350 MG/ML SOLN
100.0000 mL | Freq: Once | INTRAVENOUS | Status: AC | PRN
  Administered 2021-12-20: 75 mL via INTRAVENOUS

## 2021-12-20 NOTE — Progress Notes (Signed)
Pt seen and examined. TDC appears to be function appropriately.  CT reviewed, TDC without surrounding inflammation. Physical exam benign. No concern for infection at this time.   Appears well-placed. Unsure why he is having such lethargy. Does not appear to be catheter related.   [name]   Full note to follow.

## 2021-12-20 NOTE — ED Triage Notes (Signed)
Complains of weakness since yesterday.  Was suppose to go to dialysis but they sent him here to rule out infection of hemodialysis catheter in right chest.  Patient has tenderness around hemodialysis catheter that was placed last week.

## 2021-12-20 NOTE — Discharge Instructions (Signed)
There is no evidence of stroke or complication of your dialysis catheter. Call your dialysis center to see if you can be dialyzed tomorrow.  Return to the ED with worsening symptoms.

## 2021-12-20 NOTE — ED Provider Notes (Signed)
Patient signed out to me at 1600 by Dr. Wyvonnia Dusky. pending vascular recommendations.  In short this is a 56 year old male with a past medical history of ESRD with right chest wall dialysis catheter the presented to the emergency department with vertigo.  Patient had stroke work-up performed that showed no evidence of ischemia as the cause of his vertigo.  This is likely secondary to peripheral causes.  Patient also was noted to have swelling around his dialysis catheter site.  On my exam, patient has no active vertigo, he does have some mild swelling around his catheter site without any erythema or warmth making cellulitis less likely.  Patient is pending vascular evaluation for recommendations regarding the swelling of his catheter.   Ottie Glazier, DO 12/20/21 2053

## 2021-12-20 NOTE — ED Provider Triage Note (Signed)
Emergency Medicine Provider Triage Evaluation Note  [name] , a 56 y.o. male  was evaluated in triage.  Pt complains of fatigue, weakness and pain around a tunneled dialysis catheter site on the right.  He is here with his mother.  He has had dizziness and nausea.  That started this morning.  He complains of pain in his neck and anterior chest wall.  He reports that the other day when he went to dialysis there was a clot at the end of the catheter that had to be flushed.  He denies chest pain or shortness of breath.  He has pain in the right side of his neck.  Review of Systems  Positive: Neck pain Negative: fever  Physical Exam  Ht '5\' 7"'$  (1.702 m)   Wt 105.2 kg   BMI 36.34 kg/m  Gen:   Awake, no distress   Resp:  Normal effort  MSK:   Moves extremities without difficulty  Other:  Tenderness along the catheter.  There is obvious supraclavicular swelling, area of Dermabond placed on the neck is not appear infected.  Medical Decision Making  Medically screening exam initiated at 11:58 AM.  Appropriate orders placed.  [name] was informed that the remainder of the evaluation will be completed by another provider, this initial triage assessment does not replace that evaluation, and the importance of remaining in the ED until their evaluation is complete.  Work-up initiated   [name] Mail, PA-C 12/20/21 1200

## 2021-12-20 NOTE — ED Provider Notes (Signed)
[facility] EMERGENCY DEPARTMENT Provider Note   CSN: [id number] Arrival date & time: 12/20/21  1104     History  Chief Complaint  Patient presents with   Weakness    [name] is a 56 y.o. male.  Patient with history of ESRD on dialysis, hypertension, diabetes, sleep apnea, CHF presenting with right-sided neck pain and dizziness with onset this morning.  States he was not able to go to dialysis today because of the way he was feeling.  His PCP referred him to dialysis and they sent him here.  He describes pain to the right side of his neck near the site of a IJ tunneled catheter was placed on September 5.  He states the pain is worse when he rotates his head.  Has not had a fever.  Does have some room spinning dizziness that comes and goes as well as a headache.  Denies any fevers, chills, nausea or vomiting.  No chest pain.  Mild shortness of breath.  No cough.  Did go to dialysis on Monday and stated his catheter was clotted at that time. There is not been any bleeding or drainage from the site. Have any weakness to his arms or his legs.  No facial droop.  No difficulty speaking or difficulty swallowing.  The history is provided by the patient.  Weakness Associated symptoms: dizziness and headaches   Associated symptoms: no abdominal pain, no cough, no dysuria, no nausea, no shortness of breath and no vomiting        Home Medications Prior to Admission medications   Medication Sig Start Date End Date Taking? Authorizing Provider  Accu-Chek Softclix Lancets lancets Use as directed.  For insulin-dependent type 2 diabetes Monitor  glucose 3 times a day 01/06/20   Florencia Reasons, MD  acetaminophen (TYLENOL) 500 MG tablet Take 1,000-1,500 mg by mouth 2 (two) times daily as needed (pain).    [provider]  albuterol (PROVENTIL HFA;VENTOLIN HFA) 108 (90 BASE) MCG/ACT inhaler Inhale 2 puffs into the lungs every 6 (six) hours as needed for wheezing or  shortness of breath.     [provider]  amLODipine (NORVASC) 10 MG tablet Take 1 tablet (10 mg total) by mouth daily. 01/06/20   Florencia Reasons, MD  apixaban (ELIQUIS) 5 MG TABS tablet Take 5 mg by mouth 2 (two) times daily.    [provider]  calcitRIOL (ROCALTROL) 0.25 MCG capsule Take 1 capsule (0.25 mcg total) by mouth every Monday, Wednesday, and Friday. 01/08/20   Florencia Reasons, MD  carvedilol (COREG) 25 MG tablet Take 1 tablet (25 mg total) by mouth 2 (two) times daily with a meal. 01/06/20   Florencia Reasons, MD  fluticasone [facility]) 50 MCG/ACT nasal spray Place 1 spray into both nostrils 2 (two) times daily. 10/06/19   [provider]  gabapentin (NEURONTIN) 300 MG capsule Take 1 capsule (300 mg total) by mouth daily. Patient taking differently: Take 300 mg by mouth 2 (two) times daily. 10/28/19   Georgette Shell, MD  glucose blood test strip Use as instructed For insulin-dependent type 2 diabetes Monitor  glucose 3 times a day 01/06/20   Florencia Reasons, MD  HYDROcodone-acetaminophen (NORCO) 7.5-325 MG tablet Take 1 tablet by mouth every 4 (four) hours as needed. 12/11/19   [provider]  insulin NPH-regular Human (70-30) 100 UNIT/ML injection Inject 15 Units into the skin 2 (two) times daily. Ac breakfast and supper    [provider]  levocetirizine (  XYZAL) 5 MG tablet Take 5 mg by mouth at bedtime. 10/06/19   [provider]  multivitamin (RENA-VIT) TABS tablet Take 1 tablet by mouth at bedtime. 01/06/20   Florencia Reasons, MD  oxyCODONE-acetaminophen (PERCOCET) 5-325 MG tablet Take 1-2 tablets by mouth every 4 (four) hours as needed for severe pain. 12/12/21 12/12/22  Angelia Mould, MD  sevelamer carbonate (RENVELA) 800 MG tablet Take 1 tablet (800 mg total) by mouth 3 (three) times daily with meals. 01/06/20   Florencia Reasons, MD  tiZANidine (ZANAFLEX) 4 MG tablet Take 4 mg by mouth every 8 (eight) hours as needed for muscle spasms.  04/08/17   [provider]  traMADol (ULTRAM) 50 MG tablet Take 50 mg by mouth 3 (three) times daily as needed (pain).  08/17/19   [provider]  Vitamin D, Ergocalciferol, (DRISDOL) 1.25 MG (50000 UNIT) CAPS capsule Take 50,000 Units by mouth every Sunday. 08/17/19   [provider]      Allergies    Patient has no known allergies.    Review of Systems   Review of Systems  Respiratory:  Negative for cough, chest tightness and shortness of breath.   Gastrointestinal:  Negative for abdominal pain, nausea and vomiting.  Genitourinary:  Negative for dysuria and hematuria.  Musculoskeletal:  Positive for neck pain.  Skin:  Positive for wound. Negative for rash.  Neurological:  Positive for dizziness, weakness and headaches.    all other systems are negative except as noted in the HPI and PMH.   Physical Exam Updated Vital Signs BP (!) 152/85   Pulse 72   Temp 97.7 F (36.5 C) (Oral)   Resp 15   Ht '5\' 7"'$  (1.702 m)   Wt 105.2 kg   SpO2 97%   BMI 36.34 kg/m  Physical Exam Vitals and nursing note reviewed.  Constitutional:      General: He is not in acute distress.    Appearance: He is well-developed.  HENT:     Head: Normocephalic and atraumatic.     Mouth/Throat:     Pharynx: No oropharyngeal exudate.  Eyes:     Conjunctiva/sclera: Conjunctivae normal.     Pupils: Pupils are equal, round, and reactive to light.  Neck:     Comments: No meningismus.  There is swelling and tenderness along the tunneled IJ catheter without overlying erythema.  There is some supraclavicular swelling. Cardiovascular:     Rate and Rhythm: Normal rate and regular rhythm.     Heart sounds: Normal heart sounds. No murmur heard. Pulmonary:     Effort: Pulmonary effort is normal. No respiratory distress.     Breath sounds: Normal breath sounds.  Abdominal:     Palpations: Abdomen is soft.     Tenderness: There is no abdominal tenderness. There is no guarding or rebound.  Musculoskeletal:         General: No tenderness. Normal range of motion.     Cervical back: Normal range of motion and neck supple.  Skin:    General: Skin is warm.  Neurological:     Mental Status: He is alert and oriented to person, place, and time.     Cranial Nerves: No cranial nerve deficit.     Motor: No abnormal muscle tone.     Coordination: Coordination normal.     Comments:  5/5 strength throughout. CN 2-12 intact.Equal grip strength.  No ataxia on finger-to-nose.  No appreciable nystagmus.  No pronator drift.  5/5 strength throughout  Psychiatric:        Behavior: Behavior normal.     ED Results / Procedures / Treatments   Labs (all labs ordered are listed, but only abnormal results are displayed) Labs Reviewed  BASIC METABOLIC PANEL - Abnormal; Notable for the following components:      Result Value   Sodium 134 (*)    Chloride 92 (*)    Glucose, Bld 230 (*)    BUN 58 (*)    Creatinine, Ser 12.72 (*)    GFR, Estimated 4 (*)    All other components within normal limits  CBC WITH DIFFERENTIAL/PLATELET - Abnormal; Notable for the following components:   RBC 3.45 (*)    Hemoglobin 10.9 (*)    HCT 33.0 (*)    All other components within normal limits  I-STAT CHEM 8, ED - Abnormal; Notable for the following components:   Sodium 133 (*)    Chloride 97 (*)    BUN 53 (*)    Creatinine, Ser 14.10 (*)    Glucose, Bld 231 (*)    Calcium, Ion 1.12 (*)    Hemoglobin 11.6 (*)    HCT 34.0 (*)    All other components within normal limits  CBG MONITORING, ED - Abnormal; Notable for the following components:   Glucose-Capillary 224 (*)    All other components within normal limits  TROPONIN I (HIGH SENSITIVITY)    EKG EKG Interpretation  Date/Time:  Wednesday December 20 2021 12:18:30 EDT Ventricular Rate:  72 PR Interval:  176 QRS Duration: 88 QT Interval:  454 QTC Calculation: 497 R Axis:   -21 Text Interpretation: Normal sinus rhythm Nonspecific T wave abnormality Prolonged QT Abnormal  ECG When compared with ECG of 03-Jul-2021 10:46, PREVIOUS ECG IS PRESENT No significant change was found Confirmed by Ezequiel Essex [phone]) on 12/20/2021 12:36:31 PM  Radiology DG Chest Portable 1 View  Result Date: 12/20/2021 CLINICAL DATA:  Shortness of breath and neck pain EXAM: PORTABLE CHEST 1 VIEW COMPARISON:  12/12/2021 FINDINGS: Low lung volumes. Moderate left pleural effusion, new from prior. No pneumothorax. Right-sided tunneled central venous catheter with the tip in the mid to lower SVC. Unchanged cardiac and mediastinal contours compared to chest radiograph 12/12/2021 when accounting for differences in lung volumes. Nonspecific hazy left basilar pulmonary opacity. IMPRESSION: New left lower lobe small pleural effusion. Hazy left lower basilar pulmonary opacity may represent atelectasis in the setting of low lung volumes no pleural effusion, but superimposed infection is difficult to exclude. Electronically Signed   By: Marin Roberts M.D.   On: 12/20/2021 15:50   CT ANGIO HEAD NECK W WO CM  Addendum Date: 12/20/2021   ADDENDUM REPORT: 12/20/2021 15:00 ADDENDUM: Transcription error under the upper chest findings. Should state small branching opacities in the included upper lungs. These may be infectious/inflammatory. Electronically Signed   By: Macy Mis M.D.   On: 12/20/2021 15:00   Result Date: 12/20/2021 CLINICAL DATA:  Cerebral vasospasm suspected EXAM: CT ANGIOGRAPHY HEAD AND NECK TECHNIQUE: Multidetector CT imaging of the head and neck was performed using the standard protocol during bolus administration of intravenous contrast. Multiplanar CT image reconstructions and MIPs were obtained to evaluate the vascular anatomy. Carotid stenosis measurements (when applicable) are obtained utilizing NASCET criteria, using the distal internal carotid diameter as the denominator. RADIATION DOSE REDUCTION: This exam was performed according to the departmental dose-optimization program which  includes automated exposure control, adjustment of the mA and/or kV according to patient size and/or use of  iterative reconstruction technique. CONTRAST:  49m OMNIPAQUE IOHEXOL 350 MG/ML SOLN COMPARISON:  CT head 2021 FINDINGS: CT HEAD Brain: There is no acute intracranial hemorrhage, mass effect, or edema. Gray-white differentiation is preserved. There is no extra-axial fluid collection. Ventricles and sulci are within normal limits in size and configuration. Patchy hypoattenuation in the supratentorial white matter is nonspecific but may reflect mild chronic microvascular ischemic changes. Vascular: No hyperdense vessel. Skull: Calvarium is unremarkable. Sinuses/Orbits: No acute finding. Other: None. Review of the MIP images confirms the above findings CTA NECK Aortic arch: Unremarkable.  Great vessel origins are patent. Right carotid system: Patent. Trace calcified plaque along the proximal internal carotid. There is no stenosis. Left carotid system:   Patent. No stenosis. Vertebral arteries: Dominant left vertebral artery is patent. The proximal right vertebral artery is difficult to visualize due to artifact from adjacent venous collateral enhancement. Remainder appears patent. No stenosis identified. Skeleton: No significant osseous abnormality. Other neck: Unremarkable. Upper chest: Right tunneled dialysis catheter. Small ridging opacities in the glued upper lungs. Review of the MIP images confirms the above findings CTA HEAD Anterior circulation: Intracranial internal carotid arteries are patent with mild calcified plaque. Anterior cerebral arteries are patent. Left A1 ACA is dominant. Middle cerebral arteries are patent. Posterior circulation: Intracranial vertebral arteries, basilar artery, and posterior cerebral arteries are patent. Fetal origin of the right posterior cerebral. Venous sinuses: Patent as allowed by contrast bolus timing. Review of the MIP images confirms the above findings IMPRESSION: No  acute intracranial abnormality. No large vessel occlusion, hemodynamically significant stenosis, or evidence of dissection. Numerous venous collaterals in the right chest and posterior lower neck. Electronically Signed: By: PMacy MisM.D. On: 12/20/2021 14:30    Procedures Procedures    Medications Ordered in ED Medications - No data to display  ED Course/ Medical Decision Making/ A&P                           Medical Decision Making Amount and/or Complexity of Data Reviewed Labs: ordered. Decision-making details documented in ED Course. Radiology: ordered and independent interpretation performed. Decision-making details documented in ED Course. ECG/medicine tests: ordered and independent interpretation performed. Decision-making details documented in ED Course.  Risk Prescription drug management.  Pain along right IJ catheter as well as dizziness since this morning. No fever overlying erythema.  Neurological exam is nonfocal.  No ataxia finger-to-nose.  No nystagmus.  No chest pain.  Does feel mildly short of breath and is due for dialysis today.  He is not hypoxic and his lungs are clear.  Does not appear to have overlying infection of the tunneled dialysis catheter.  CTAs obtained to rule out any vascular obstruction or stenosis.  Results reviewed and interpreted by me.  CTA is negative for any stenosis or occlusion.  Discussed with Dr. RVirl Cageyof vascular surgery.  He will evaluate the catheter but does not like it requires replacement at this time.  Labs are reassuring with normal potassium and no need for emergent dialysis.  MRI negative for acute CVA.  No vertigo on recheck. Denies CP. Does have some subjective SOB but no hypoxia or increased work of breathing. He did miss dialysis today. CXR with small pleural effusion, low suspicion for PNA. Results reviewed and interpreted by me.  Awaiting vascular surgery evaluation at shift change. Troponin pending as well though low  suspicion for ACS. Anticipate discharge home after vascular surgery evaluation. Patient to call dialysis center  to see if he can be dialyzed tomorrow.  Dr. Ernesto Rutherford to assume care.         Final Clinical Impression(s) / ED Diagnoses Final diagnoses:  None    Rx / DC Orders ED Discharge Orders     None         Saladin Petrelli, Annie Main, MD 12/20/21 [phone]

## 2021-12-21 NOTE — Consult Note (Signed)
Hospital Consult    Reason for Consult: Pain over right-sided tunneled dialysis catheter Requesting Physician: ED MRN #:  [id number]  History of Present Illness: This is a 56 y.o. male with recent tunneled dialysis catheter placed on 12/12/2021.,  Per patient and his mother, he has had significant malaise since that time.  Denies fever, denies chills.  Notes waxing and waning pain over the catheter site, some pain in the shoulder and in the upper chest.  No drainage.  Catheter functioning appropriately at dialysis  Past Medical History:  Diagnosis Date   Acute kidney injury ([place]) 12/28/2014   Adjustment disorder with mixed anxiety and depressed mood 12/28/2014   CHF (congestive heart failure) ([facility])    CPAP (continuous positive airway pressure) dependence    Diabetes mellitus without complication ([place])    DM (diabetes mellitus) type II controlled with renal manifestation ([place]) 12/28/2014   DM (diabetes mellitus) type II controlled, neurological manifestation ([place]) 12/28/2014   Encephalopathy, hypertensive 12/28/2014   GERD (gastroesophageal reflux disease)    Hypertension    Hypertensive emergency without congestive heart failure 12/27/2014   Pneumonia    Possible Panic disorder 12/28/2014   Renal disorder    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   Sleep apnea     Past Surgical History:  Procedure Laterality Date   AV FISTULA PLACEMENT Left 01/05/2020   Procedure: LEFT ARM BRACHIOCEPHALIC ARTERIOVENOUS (AV) FISTULA;  Surgeon: Angelia Mould, MD;  Location: [facility];  Service: Vascular;  Laterality: Left;   EMPYEMA DRAINAGE Right 05/17/2017   Procedure: EMPYEMA DRAINAGE AND DECORTICATION;  Surgeon: Grace Isaac, MD;  Location: [facility];  Service: Thoracic;  Laterality: Right;   FLEXIBLE BRONCHOSCOPY  05/17/2017   Procedure: FLEXIBLE BRONCHOSCOPY;  Surgeon: Grace Isaac, MD;  Location: [facility];  Service: Thoracic;;   INSERTION OF DIALYSIS CATHETER Right 12/12/2021   Procedure:  INSERTION OF RIGHT INTERNAL JUGULAR DIALYSIS CATHETER;  Surgeon: Angelia Mould, MD;  Location: [facility];  Service: Vascular;  Laterality: Right;   IR FLUORO GUIDE CV LINE RIGHT  01/01/2020   IR THORACENTESIS ASP PLEURAL SPACE W/IMG GUIDE  05/17/2017   IR US GUIDE VASC ACCESS RIGHT  01/01/2020   NO PAST SURGERIES     REVISON OF ARTERIOVENOUS FISTULA Left 12/12/2021   Procedure: REVISION OF LEFT ARM FISTULA BY PLICATION;  Surgeon: Angelia Mould, MD;  Location: [facility];  Service: Vascular;  Laterality: Left;   THORACOTOMY/LOBECTOMY Right 05/17/2017   Procedure: RIGHT MINI THORACOTOMY;  Surgeon: Grace Isaac, MD;  Location: [facility];  Service: Thoracic;  Laterality: Right;   VIDEO ASSISTED THORACOSCOPY (VATS)/EMPYEMA Right 05/17/2017   Procedure: RIGHT VIDEO ASSISTED THORACOSCOPY (VATS)/EMPYEMA;  Surgeon: Grace Isaac, MD;  Location[facility];  Service: Thoracic;  Laterality: Right;    No Known Allergies  Prior to Admission medications   Medication Sig Start Date End Date Taking? Authorizing Provider  Accu-Chek Softclix Lancets lancets Use as directed.  For insulin-dependent type 2 diabetes Monitor  glucose 3 times a day 01/06/20   Florencia Reasons, MD  acetaminophen (TYLENOL) 500 MG tablet Take 1,000-1,500 mg by mouth 2 (two) times daily as needed (pain).    [provider]  albuterol (PROVENTIL HFA;VENTOLIN HFA) 108 (90 BASE) MCG/ACT inhaler Inhale 2 puffs into the lungs every 6 (six) hours as needed for wheezing or shortness of breath.     [provider]  amLODipine (NORVASC) 10 MG tablet Take 1 tablet (10 mg total) by mouth daily. 01/06/20  Florencia Reasons, MD  apixaban (ELIQUIS) 5 MG TABS tablet Take 5 mg by mouth 2 (two) times daily.    [provider]  calcitRIOL (ROCALTROL) 0.25 MCG capsule Take 1 capsule (0.25 mcg total) by mouth every Monday, Wednesday, and Friday. 01/08/20   Florencia Reasons, MD  carvedilol (COREG) 25 MG tablet Take 1 tablet (25 mg total) by mouth 2 (two)  times daily with a meal. 01/06/20   Florencia Reasons, MD  fluticasone [facility]) 50 MCG/ACT nasal spray Place 1 spray into both nostrils 2 (two) times daily. 10/06/19   [provider]  gabapentin (NEURONTIN) 300 MG capsule Take 1 capsule (300 mg total) by mouth daily. Patient taking differently: Take 300 mg by mouth 2 (two) times daily. 10/28/19   Georgette Shell, MD  glucose blood test strip Use as instructed For insulin-dependent type 2 diabetes Monitor  glucose 3 times a day 01/06/20   Florencia Reasons, MD  HYDROcodone-acetaminophen (NORCO) 7.5-325 MG tablet Take 1 tablet by mouth every 4 (four) hours as needed. 12/11/19   [provider]  insulin NPH-regular Human (70-30) 100 UNIT/ML injection Inject 15 Units into the skin 2 (two) times daily. Ac breakfast and supper    [provider]  levocetirizine (XYZAL) 5 MG tablet Take 5 mg by mouth at bedtime. 10/06/19   [provider]  multivitamin (RENA-VIT) TABS tablet Take 1 tablet by mouth at bedtime. 01/06/20   Florencia Reasons, MD  oxyCODONE-acetaminophen (PERCOCET) 5-325 MG tablet Take 1-2 tablets by mouth every 4 (four) hours as needed for severe pain. 12/12/21 12/12/22  Angelia Mould, MD  sevelamer carbonate (RENVELA) 800 MG tablet Take 1 tablet (800 mg total) by mouth 3 (three) times daily with meals. 01/06/20   Florencia Reasons, MD  tiZANidine (ZANAFLEX) 4 MG tablet Take 4 mg by mouth every 8 (eight) hours as needed for muscle spasms.  04/08/17   [provider]  traMADol (ULTRAM) 50 MG tablet Take 50 mg by mouth 3 (three) times daily as needed (pain).  08/17/19   [provider]  Vitamin D, Ergocalciferol, (DRISDOL) 1.25 MG (50000 UNIT) CAPS capsule Take 50,000 Units by mouth every Sunday. 08/17/19   [provider]    Social History   Socioeconomic History   Marital status: Married    Spouse name: Not on file   Number of children: Not on file   Years of education: Not on file   Highest education level:  Not on file  Occupational History   Not on file  Tobacco Use   Smoking status: Never    Passive exposure: Never   Smokeless tobacco: Never  Vaping Use   Vaping Use: Never used  Substance and Sexual Activity   Alcohol use: No   Drug use: No   Sexual activity: Not on file  Other Topics Concern   Not on file  Social History Narrative   Not on file   Social Determinants of Health   Financial Resource Strain: Not on file  Food Insecurity: Not on file  Transportation Needs: Not on file  Physical Activity: Not on file  Stress: Not on file  Social Connections: Not on file  Intimate Partner Violence: Not on file   Family History  Problem Relation Age of Onset   Hypertension Mother     ROS: Otherwise negative unless mentioned in HPI  Physical Examination  Vitals:   12/20/21 2044 12/20/21 2045  BP:    Pulse:    Resp: 15 13  Temp:    SpO2:     Body mass index is 36.34 kg/m.  General:  WDWN in NAD Gait: Not observed HENT: WNL, normocephalic Pulmonary: normal non-labored breathing Cardiac: regular Abdomen:  soft, NT/ND, no masses Skin: without rashes Vascular Exam/Pulses: 2 rihgt radial pulse Extremities: without ischemic changes, without Gangrene , without cellulitis; without open wounds;  Musculoskeletal: no muscle wasting or atrophy  Neurologic: A&O X 3;  No focal weakness or paresthesias are detected; speech is fluent/normal Psychiatric:  The pt has Normal affect. Lymph:  Unremarkable  CBC    Component Value Date/Time   WBC 10.3 12/20/2021 1206   RBC 3.45 (L) 12/20/2021 1206   HGB 11.6 (L) 12/20/2021 1224   HCT 34.0 (L) 12/20/2021 1224   PLT 386 12/20/2021 1206   MCV 95.7 12/20/2021 1206   MCH 31.6 12/20/2021 1206   MCHC 33.0 12/20/2021 1206   RDW 13.0 12/20/2021 1206   LYMPHSABS 2.8 12/20/2021 1206   MONOABS 0.5 12/20/2021 1206   EOSABS 0.3 12/20/2021 1206   BASOSABS 0.1 12/20/2021 1206    BMET    Component Value Date/Time   NA 133 (L)  12/20/2021 1224   K 4.0 12/20/2021 1224   CL 97 (L) 12/20/2021 1224   CO2 27 12/20/2021 1206   GLUCOSE 231 (H) 12/20/2021 1224   BUN 53 (H) 12/20/2021 1224   CREATININE 14.10 (H) 12/20/2021 1224   CALCIUM 9.6 12/20/2021 1206   GFRNONAA 4 (L) 12/20/2021 1206   GFRAA 13 (L) 01/07/2020 0130    COAGS: Lab Results  Component Value Date   INR 1.14 05/16/2017   INR 1.12 12/27/2014     Non-Invasive Vascular Imaging:    Present Intervention, unless it is so low that I cannot do a TCAR carotid risk associated   ASSESSMENT/PLAN: This is a 56 y.o. male with history of end-stage renal disease status post right-sided tunneled HD catheter placed on 12/12/2021.  CT was reviewed, demonstrating the catheter in the correct place.  There is no overlying soft tissue swelling or edema concerning for infection.  The catheter is functioning appropriately.  I do not have an etiology for the patient's shoulder pain or chest pain.  The pain surrounding the tunneled dialysis catheter waxes and wanes which is common.    I had a long conversation with Tamon regarding the above.  Being that the catheter is functioning appropriately, I recommended leaving it in place.  He agreed.   Cassandria Santee MD [facility] [phone] 12/21/2021  4:14 PM

## 2021-12-28 ENCOUNTER — Ambulatory Visit: Payer: Medicare Other | Admitting: Podiatry

## 2021-12-28 DIAGNOSIS — M7750 Other enthesopathy of unspecified foot: Secondary | ICD-10-CM

## 2022-01-11 ENCOUNTER — Ambulatory Visit (INDEPENDENT_AMBULATORY_CARE_PROVIDER_SITE_OTHER): Payer: Medicare Other

## 2022-01-11 ENCOUNTER — Ambulatory Visit (INDEPENDENT_AMBULATORY_CARE_PROVIDER_SITE_OTHER): Payer: Medicare Other | Admitting: Podiatry

## 2022-01-11 ENCOUNTER — Ambulatory Visit: Payer: Medicare Other | Admitting: Podiatry

## 2022-01-11 DIAGNOSIS — M7750 Other enthesopathy of unspecified foot: Secondary | ICD-10-CM

## 2022-01-11 DIAGNOSIS — M778 Other enthesopathies, not elsewhere classified: Secondary | ICD-10-CM

## 2022-01-11 DIAGNOSIS — M7752 Other enthesopathy of left foot: Secondary | ICD-10-CM

## 2022-01-11 DIAGNOSIS — S86012A Strain of left Achilles tendon, initial encounter: Secondary | ICD-10-CM

## 2022-01-11 DIAGNOSIS — M7751 Other enthesopathy of right foot: Secondary | ICD-10-CM

## 2022-01-11 DIAGNOSIS — M722 Plantar fascial fibromatosis: Secondary | ICD-10-CM | POA: Diagnosis not present

## 2022-01-11 NOTE — Progress Notes (Signed)
Subjective:  Patient ID: [name], male    DOB: 03-24-1966,  MRN: [id number] HPI Chief Complaint  Patient presents with   Foot Pain    Left tendon pain- injury a couple of years ago. Patient never followed up with a provider.   Right heel pain- injury when patient was a child. Pain is sharp and occasionally.     56 y.o. male presents with the above complaint.   ROS: Denies fever chills nausea vomit muscle aches pains calf pain back pain chest pain shortness of breath.  Injury to his left Achilles occurred in his home January or February 2023.  Was seen by orthopedics at urgent care who put him in a cam boot that he stated for 2 months he then saw a male podiatrist locally and was told that there was nothing that can be done to work on the Achilles.  Past Medical History:  Diagnosis Date   Acute kidney injury ([place]) 12/28/2014   Adjustment disorder with mixed anxiety and depressed mood 12/28/2014   CHF (congestive heart failure) ([facility])    CPAP (continuous positive airway pressure) dependence    Diabetes mellitus without complication ([place])    DM (diabetes mellitus) type II controlled with renal manifestation ([place]) 12/28/2014   DM (diabetes mellitus) type II controlled, neurological manifestation ([facility]) 12/28/2014   Encephalopathy, hypertensive 12/28/2014   GERD (gastroesophageal reflux disease)    Hypertension    Hypertensive emergency without congestive heart failure 12/27/2014   Pneumonia    Possible Panic disorder 12/28/2014   Renal disorder    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   Sleep apnea    Past Surgical History:  Procedure Laterality Date   AV FISTULA PLACEMENT Left 01/05/2020   Procedure: LEFT ARM BRACHIOCEPHALIC ARTERIOVENOUS (AV) FISTULA;  Surgeon: Angelia Mould, MD;  Location: [facility];  Service: Vascular;  Laterality: Left;   EMPYEMA DRAINAGE Right 05/17/2017   Procedure: EMPYEMA DRAINAGE AND DECORTICATION;  Surgeon: Grace Isaac, MD;  Location: [facility];   Service: Thoracic;  Laterality: Right;   FLEXIBLE BRONCHOSCOPY  05/17/2017   Procedure: FLEXIBLE BRONCHOSCOPY;  Surgeon: Grace Isaac, MD;  Location: [facility];  Service: Thoracic;;   INSERTION OF DIALYSIS CATHETER Right 12/12/2021   Procedure: INSERTION OF RIGHT INTERNAL JUGULAR DIALYSIS CATHETER;  Surgeon: Angelia Mould, MD;  Location: [facility];  Service: Vascular;  Laterality: Right;   IR FLUORO GUIDE CV LINE RIGHT  01/01/2020   IR THORACENTESIS ASP PLEURAL SPACE W/IMG GUIDE  05/17/2017   IR US GUIDE VASC ACCESS RIGHT  01/01/2020   NO PAST SURGERIES     REVISON OF ARTERIOVENOUS FISTULA Left 12/12/2021   Procedure: REVISION OF LEFT ARM FISTULA BY PLICATION;  Surgeon: Angelia Mould, MD;  Location: [facility];  Service: Vascular;  Laterality: Left;   THORACOTOMY/LOBECTOMY Right 05/17/2017   Procedure: RIGHT MINI THORACOTOMY;  Surgeon: Grace Isaac, MD;  Location: [facility];  Service: Thoracic;  Laterality: Right;   VIDEO ASSISTED THORACOSCOPY (VATS)/EMPYEMA Right 05/17/2017   Procedure: RIGHT VIDEO ASSISTED THORACOSCOPY (VATS)/EMPYEMA;  Surgeon: Grace Isaac, MD;  Location[facility];  Service: Thoracic;  Laterality: Right;    Current Outpatient Medications:    Accu-Chek Softclix Lancets lancets, Use as directed.  For insulin-dependent type 2 diabetes Monitor  glucose 3 times a day, Disp: 100 each, Rfl: 0   acetaminophen (TYLENOL) 500 MG tablet, Take 1,000-1,500 mg by mouth 2 (two) times daily as needed (pain)., Disp: , Rfl:    albuterol (PROVENTIL HFA;VENTOLIN HFA) 108 (  90 BASE) MCG/ACT inhaler, Inhale 2 puffs into the lungs every 6 (six) hours as needed for wheezing or shortness of breath. , Disp: , Rfl:    amLODipine (NORVASC) 10 MG tablet, Take 1 tablet (10 mg total) by mouth daily., Disp: 30 tablet, Rfl: 0   apixaban (ELIQUIS) 5 MG TABS tablet, Take 5 mg by mouth 2 (two) times daily., Disp: , Rfl:    calcitRIOL (ROCALTROL) 0.25 MCG capsule, Take 1 capsule (0.25 mcg total) by mouth  every Monday, Wednesday, and Friday., Disp: 12 capsule, Rfl: 0   carvedilol (COREG) 25 MG tablet, Take 1 tablet (25 mg total) by mouth 2 (two) times daily with a meal., Disp: 60 tablet, Rfl: 0   fluticasone (FLONASE) 50 MCG/ACT nasal spray, Place 1 spray into both nostrils 2 (two) times daily., Disp: , Rfl:    gabapentin (NEURONTIN) 300 MG capsule, Take 1 capsule (300 mg total) by mouth daily. (Patient taking differently: Take 300 mg by mouth 2 (two) times daily.), Disp: 30 capsule, Rfl: 0   glucose blood test strip, Use as instructed For insulin-dependent type 2 diabetes Monitor  glucose 3 times a day, Disp: 100 each, Rfl: 0   HYDROcodone-acetaminophen (NORCO) 7.5-325 MG tablet, Take 1 tablet by mouth every 4 (four) hours as needed., Disp: , Rfl:    insulin NPH-regular Human (70-30) 100 UNIT/ML injection, Inject 15 Units into the skin 2 (two) times daily. Ac breakfast and supper, Disp: , Rfl:    levocetirizine (XYZAL) 5 MG tablet, Take 5 mg by mouth at bedtime., Disp: , Rfl:    multivitamin (RENA-VIT) TABS tablet, Take 1 tablet by mouth at bedtime., Disp: 30 tablet, Rfl: 0   oxyCODONE-acetaminophen (PERCOCET) 5-325 MG tablet, Take 1-2 tablets by mouth every 4 (four) hours as needed for severe pain., Disp: 20 tablet, Rfl: 0   sevelamer carbonate (RENVELA) 800 MG tablet, Take 1 tablet (800 mg total) by mouth 3 (three) times daily with meals., Disp: 90 tablet, Rfl: 0   tiZANidine (ZANAFLEX) 4 MG tablet, Take 4 mg by mouth every 8 (eight) hours as needed for muscle spasms. , Disp: , Rfl: 0   traMADol (ULTRAM) 50 MG tablet, Take 50 mg by mouth 3 (three) times daily as needed (pain). , Disp: , Rfl:    Vitamin D, Ergocalciferol, (DRISDOL) 1.25 MG (50000 UNIT) CAPS capsule, Take 50,000 Units by mouth every Sunday., Disp: , Rfl:   No Known Allergies Review of Systems Objective:  There were no vitals filed for this visit.  General: Well developed, nourished, in no acute distress, alert and oriented x3    Dermatological: Skin is warm, dry and supple bilateral. Nails x 10 are well maintained; remaining integument appears unremarkable at this time. There are no open sores, no preulcerative lesions, no rash or signs of infection present.  Vascular: Dorsalis Pedis artery and Posterior Tibial artery pedal pulses are 2/4 bilateral with immedate capillary fill time. Pedal hair growth present. No varicosities and no lower extremity edema present bilateral.   Neruologic: Grossly intact via light touch bilateral. Vibratory intact via tuning fork bilateral. Protective threshold with Semmes Wienstein monofilament intact to all pedal sites bilateral. Patellar and Achilles deep tendon reflexes 2+ bilateral. No Babinski or clonus noted bilateral.   Musculoskeletal: No gross boney pedal deformities bilateral. No pain, crepitus, or limitation noted with foot and ankle range of motion bilateral. Muscular strength 5/5 in all groups tested bilateral.  Achilles tendon is exquisitely tender and painful with irregular margins on palpation.  He does have good plantarflexion against resistance 5/5.  Is moderately tender for him.  He also has tenderness on palpation of the Achilles at its insertion site on his calcaneus.  Contralateral foot demonstrates pain on palpation medial calcaneal tubercle.  No pain on medial-lateral compression of the calcaneus.  Gait: Unassisted, Nonantalgic.    Radiographs:  Radiographs taken today demonstrate an osseously mature individual the left foot does demonstrate severe thickening of the Achilles tendon and near obliteration of the margins of the tendon due to excessive scar tissue and soft tissue edema.  Contralateral foot demonstrate soft tissue increase in density of plantar fascial Caney insertion site no acute findings identified.  Achilles tendon appears relatively normal in this view.  Assessment & Plan:   Assessment: Probable tear of the traumatic injury to his Achilles  tendon left.  Plan fasciitis right.  Plan: I injected the plantar fascia right with 20 mg Kenalog 5 mg Marcaine point maximal tenderness.  We are requesting an MRI without contrast for surgical planning as well as differential diagnoses to his left Achilles.  At this point last radiograph was performed today with interpretation.      Orit Sanville T. [place], [place]

## 2022-01-15 ENCOUNTER — Ambulatory Visit: Payer: Medicare Other | Admitting: Podiatry

## 2022-01-23 NOTE — Progress Notes (Unsigned)
    Postoperative Access Visit   History of Present Illness   [name] is a 56 y.o. year old male who presents for postoperative follow-up for: Ultrasound-guided placement of right IJ tunneled 23 cm dialysis catheter and Plication of left brachiocephalic fistula with revision by Dr. Scot Dock on 12/12/21. This was for aneurysm of AV fistula.  The patient's wounds are *** healed.  The patient notes *** steal symptoms.  The patient is *** able to complete their activities of daily living.  The patient's current symptoms are: ***.   Physical Examination  There were no vitals filed for this visit. There is no height or weight on file to calculate BMI.  left arm Incision is *** healed, *** radial pulse, hand grip is ***/5, sensation in digits is *** intact, ***palpable thrill, bruit can *** be auscultated     Medical Decision Making   [name] is a 56 y.o. year old male who presents s/p Ultrasound-guided placement of right IJ tunneled 23 cm dialysis catheter and Plication of left brachiocephalic fistula with revision by Dr. Scot Dock on 12/12/21. This was for aneurysm of AV fistula.  Patent *** without signs or symptoms of steal syndrome The patient's access will be ready for use *** ***The patient's tunneled dialysis catheter can be removed when Nephrology is comfortable with the performance of the *** The patient may follow up on a prn basis   [name] Caldwell, PA-C [facility]: [facility] MD: Donzetta Matters

## 2022-01-25 ENCOUNTER — Ambulatory Visit (INDEPENDENT_AMBULATORY_CARE_PROVIDER_SITE_OTHER): Payer: Medicare Other | Admitting: Physician Assistant

## 2022-01-25 VITALS — BP 198/103 | HR 75 | Temp 97.0°F | Resp 20 | Ht 67.0 in | Wt 234.2 lb

## 2022-01-25 DIAGNOSIS — N186 End stage renal disease: Secondary | ICD-10-CM

## 2022-01-26 ENCOUNTER — Other Ambulatory Visit: Payer: Self-pay

## 2022-01-26 DIAGNOSIS — N186 End stage renal disease: Secondary | ICD-10-CM

## 2022-01-28 ENCOUNTER — Ambulatory Visit
Admission: RE | Admit: 2022-01-28 | Discharge: 2022-01-28 | Disposition: A | Discharge status: Home / Self Care | Payer: Medicare Other | Source: Ambulatory Visit | Attending: Podiatry | Admitting: Podiatry

## 2022-01-28 DIAGNOSIS — S86012A Strain of left Achilles tendon, initial encounter: Secondary | ICD-10-CM

## 2022-02-06 NOTE — Progress Notes (Unsigned)
Postoperative Access Visit   History of Present Illness   [name] is a 56 y.o. year old male who presents for postoperative follow-up for: Ultrasound-guided placement of right IJ tunneled 23 cm dialysis catheter and Plication of left brachiocephalic fistula with revision by Dr. Scot Dock on 12/12/21. This was for aneurysm of AV fistula. The patient's wounds are well healed.  The patient notes mild steal symptoms with intermittent coldness of his left hand that occurs only at dialysis. I saw patient several weeks ago in the office and he was overall doing well. The fistula however was rather pulsatile and advised patient that they could attempt cannulation of the fistula or he could continue using his [name] and return for duplex evaluation. He is here for his follow up with duplex.   He currently dialyzes via right IJ TDC at [facility] on MWF  Physical Examination   Vitals:   02/08/22 0947  BP: (!) 162/91  Pulse: 76  Resp: 20  Temp: (!) 97.1 F (36.2 C)  TempSrc: Temporal  SpO2: 98%  Weight: 232 lb 4.8 oz (105.4 kg)  Height: '5\' 7"'$  (1.702 m)   Body mass index is 36.38 kg/m.  left arm Incision is well healed, 2+ radial pulse, hand grip is 5/5, sensation in digits is intact, palpable thrill, bruit can be auscultated    Non invasive vascular lab: 02/08/22 Findings:  +--------------------+----------+-----------------+--------+  AVF                PSV (cm/s)Flow Vol (mL/min)Comments  +--------------------+----------+-----------------+--------+  Native artery inflow   312          1495                 +--------------------+----------+-----------------+--------+  AVF Anastomosis        461                               +--------------------+----------+-----------------+--------+     +------------+---------+-------------+---------+---------------------------  ----+  OUTFLOW VEIN   PSV   Diameter (cm)  Depth             Describe                             (cm/s)                 (cm)                                     +------------+---------+-------------+---------+---------------------------  ----+  Shoulder      283       0.90       0.59                                     +------------+---------+-------------+---------+---------------------------  ----+  Prox UA        263       1.06       0.22         partially-occlusive         +------------+---------+-------------+---------+---------------------------  ----+  Mid UA         121       1.43       0.37       prox-mid segment  mildly  tortuous               +------------+---------+-------------+---------+---------------------------  ----+  Dist UA        78        1.95       0.48                                     +------------+---------+-------------+---------+---------------------------  ----+  AC Fossa       211       1.93       0.53                                     +------------+---------+-------------+---------+---------------------------  ----+     Summary:  Patent arteriovenous fistula.    Medical Decision Making   [name] is a 56 y.o. year old male who presents s/p Ultrasound-guided placement of right IJ tunneled 23 cm dialysis catheter and Plication of left brachiocephalic fistula with revision by Dr. Scot Dock on 12/12/21. This was for aneurysm of AV fistula.Duplex today shows patent left AV fistula with good volume flow. There is small area of partial occlusion noted which I suspect is secondary to area of plication. Clinically the fistula still has some pulsatility to it otherwise has good thrill. I have recommended allowing fistula to be accessed. If there are any issues with cannulation or function of fistula he will need Fistulogram.   Patent is without signs or symptoms of steal syndrome The patient's access will be ready for use immediately The  patient's tunneled dialysis catheter can be removed when Nephrology is comfortable with the performance of the left AV fistula The patient may follow up on a prn basis   Karoline Caldwell, PA-C [facility]: [phone]  Clinic MD: Scot Dock

## 2022-02-08 ENCOUNTER — Ambulatory Visit (HOSPITAL_COMMUNITY)
Admission: RE | Admit: 2022-02-08 | Discharge: 2022-02-08 | Disposition: A | Discharge status: Home / Self Care | Payer: Medicare Other | Source: Ambulatory Visit | Attending: Vascular Surgery | Admitting: Vascular Surgery

## 2022-02-08 ENCOUNTER — Ambulatory Visit: Payer: Medicare Other | Admitting: Physician Assistant

## 2022-02-08 VITALS — BP 162/91 | HR 76 | Temp 97.1°F | Resp 20 | Ht 67.0 in | Wt 232.3 lb

## 2022-02-08 DIAGNOSIS — N186 End stage renal disease: Secondary | ICD-10-CM

## 2022-02-12 ENCOUNTER — Telehealth: Payer: Self-pay | Admitting: *Deleted

## 2022-02-12 NOTE — Telephone Encounter (Signed)
-----   Message from Garrel Ridgel, [place] sent at 02/08/2022  7:22 AM EDT ----- Severe tendinosis. Refer to PT

## 2022-03-18 ENCOUNTER — Inpatient Hospital Stay (HOSPITAL_COMMUNITY)
Admission: EM | Admit: 2022-03-18 | Discharge: 2022-03-22 | DRG: 640 | Disposition: A | Discharge status: Home / Self Care | Payer: Medicare Other | Attending: Internal Medicine | Admitting: Internal Medicine

## 2022-03-18 ENCOUNTER — Emergency Department (HOSPITAL_COMMUNITY): Payer: Medicare Other

## 2022-03-18 DIAGNOSIS — E785 Hyperlipidemia, unspecified: Secondary | ICD-10-CM | POA: Diagnosis present

## 2022-03-18 DIAGNOSIS — E119 Type 2 diabetes mellitus without complications: Secondary | ICD-10-CM | POA: Diagnosis present

## 2022-03-18 DIAGNOSIS — Z1152 Encounter for screening for COVID-19: Secondary | ICD-10-CM | POA: Diagnosis not present

## 2022-03-18 DIAGNOSIS — I5032 Chronic diastolic (congestive) heart failure: Secondary | ICD-10-CM | POA: Diagnosis present

## 2022-03-18 DIAGNOSIS — J810 Acute pulmonary edema: Secondary | ICD-10-CM | POA: Diagnosis present

## 2022-03-18 DIAGNOSIS — E669 Obesity, unspecified: Secondary | ICD-10-CM | POA: Diagnosis present

## 2022-03-18 DIAGNOSIS — I480 Paroxysmal atrial fibrillation: Secondary | ICD-10-CM | POA: Diagnosis present

## 2022-03-18 DIAGNOSIS — Z794 Long term (current) use of insulin: Secondary | ICD-10-CM

## 2022-03-18 DIAGNOSIS — D631 Anemia in chronic kidney disease: Secondary | ICD-10-CM | POA: Diagnosis present

## 2022-03-18 DIAGNOSIS — J9601 Acute respiratory failure with hypoxia: Secondary | ICD-10-CM | POA: Diagnosis present

## 2022-03-18 DIAGNOSIS — Z992 Dependence on renal dialysis: Secondary | ICD-10-CM

## 2022-03-18 DIAGNOSIS — I132 Hypertensive heart and chronic kidney disease with heart failure and with stage 5 chronic kidney disease, or end stage renal disease: Secondary | ICD-10-CM | POA: Diagnosis present

## 2022-03-18 DIAGNOSIS — Z6835 Body mass index (BMI) 35.0-35.9, adult: Secondary | ICD-10-CM

## 2022-03-18 DIAGNOSIS — M898X9 Other specified disorders of bone, unspecified site: Secondary | ICD-10-CM | POA: Diagnosis present

## 2022-03-18 DIAGNOSIS — E8770 Fluid overload, unspecified: Secondary | ICD-10-CM | POA: Insufficient documentation

## 2022-03-18 DIAGNOSIS — G4733 Obstructive sleep apnea (adult) (pediatric): Secondary | ICD-10-CM | POA: Diagnosis present

## 2022-03-18 DIAGNOSIS — E66812 Obesity, class 2: Secondary | ICD-10-CM | POA: Diagnosis present

## 2022-03-18 DIAGNOSIS — I10 Essential (primary) hypertension: Secondary | ICD-10-CM | POA: Diagnosis present

## 2022-03-18 DIAGNOSIS — E1169 Type 2 diabetes mellitus with other specified complication: Secondary | ICD-10-CM | POA: Diagnosis present

## 2022-03-18 DIAGNOSIS — K219 Gastro-esophageal reflux disease without esophagitis: Secondary | ICD-10-CM | POA: Diagnosis present

## 2022-03-18 DIAGNOSIS — N179 Acute kidney failure, unspecified: Secondary | ICD-10-CM | POA: Diagnosis present

## 2022-03-18 DIAGNOSIS — E1122 Type 2 diabetes mellitus with diabetic chronic kidney disease: Secondary | ICD-10-CM | POA: Diagnosis present

## 2022-03-18 DIAGNOSIS — E8779 Other fluid overload: Secondary | ICD-10-CM | POA: Diagnosis present

## 2022-03-18 DIAGNOSIS — N186 End stage renal disease: Secondary | ICD-10-CM | POA: Diagnosis present

## 2022-03-18 DIAGNOSIS — Z7901 Long term (current) use of anticoagulants: Secondary | ICD-10-CM | POA: Diagnosis not present

## 2022-03-18 DIAGNOSIS — Z91158 Patient's noncompliance with renal dialysis for other reason: Secondary | ICD-10-CM

## 2022-03-18 DIAGNOSIS — J449 Chronic obstructive pulmonary disease, unspecified: Secondary | ICD-10-CM | POA: Diagnosis present

## 2022-03-18 DIAGNOSIS — Z79899 Other long term (current) drug therapy: Secondary | ICD-10-CM

## 2022-03-18 DIAGNOSIS — J189 Pneumonia, unspecified organism: Secondary | ICD-10-CM

## 2022-03-18 DIAGNOSIS — Z8249 Family history of ischemic heart disease and other diseases of the circulatory system: Secondary | ICD-10-CM | POA: Diagnosis not present

## 2022-03-18 LAB — CBC WITH DIFFERENTIAL/PLATELET
Abs Immature Granulocytes: 0.08 10*3/uL — ABNORMAL HIGH (ref 0.00–0.07)
Basophils Absolute: 0 10*3/uL (ref 0.0–0.1)
Basophils Relative: 0 %
Eosinophils Absolute: 0.1 10*3/uL (ref 0.0–0.5)
Eosinophils Relative: 1 %
HCT: 33.2 % — ABNORMAL LOW (ref 39.0–52.0)
Hemoglobin: 11 g/dL — ABNORMAL LOW (ref 13.0–17.0)
Immature Granulocytes: 1 %
Lymphocytes Relative: 14 %
Lymphs Abs: 1.6 10*3/uL (ref 0.7–4.0)
MCH: 31.3 pg (ref 26.0–34.0)
MCHC: 33.1 g/dL (ref 30.0–36.0)
MCV: 94.3 fL (ref 80.0–100.0)
Monocytes Absolute: 0.6 10*3/uL (ref 0.1–1.0)
Monocytes Relative: 5 %
Neutro Abs: 9.1 10*3/uL — ABNORMAL HIGH (ref 1.7–7.7)
Neutrophils Relative %: 79 %
Platelets: 403 10*3/uL — ABNORMAL HIGH (ref 150–400)
RBC: 3.52 MIL/uL — ABNORMAL LOW (ref 4.22–5.81)
RDW: 13.2 % (ref 11.5–15.5)
WBC: 11.5 10*3/uL — ABNORMAL HIGH (ref 4.0–10.5)
nRBC: 0 % (ref 0.0–0.2)

## 2022-03-18 LAB — BASIC METABOLIC PANEL
Anion gap: 14 (ref 5–15)
BUN: 75 mg/dL — ABNORMAL HIGH (ref 6–20)
CO2: 22 mmol/L (ref 22–32)
Calcium: 9.5 mg/dL (ref 8.9–10.3)
Chloride: 99 mmol/L (ref 98–111)
Creatinine, Ser: 14.48 mg/dL — ABNORMAL HIGH (ref 0.61–1.24)
GFR, Estimated: 4 mL/min — ABNORMAL LOW (ref >60)
Glucose, Bld: 157 mg/dL — ABNORMAL HIGH (ref 70–99)
Potassium: 4.2 mmol/L (ref 3.5–5.1)
Sodium: 135 mmol/L (ref 135–145)

## 2022-03-18 LAB — BRAIN NATRIURETIC PEPTIDE: B Natriuretic Peptide: 527.8 pg/mL — ABNORMAL HIGH (ref 0.0–100.0)

## 2022-03-18 LAB — LACTIC ACID, PLASMA: Lactic Acid, Venous: 1.2 mmol/L (ref 0.5–1.9)

## 2022-03-18 MED ORDER — FUROSEMIDE 10 MG/ML IJ SOLN: 40.0000 mg | Freq: Once | INTRAMUSCULAR | Status: DC

## 2022-03-18 MED ORDER — HYDROCOD POLI-CHLORPHE POLI ER 10-8 MG/5ML PO SUER
5.0000 mL | Freq: Once | ORAL | Status: AC
  Administered 2022-03-18: 5 mL via ORAL
  Filled 2022-03-18: qty 5

## 2022-03-18 NOTE — H&P (Signed)
History and Physical    [name] KZL:[id number] DOB: December 25, 1965 DOA: 03/18/2022  PCP: Bartholome Bill, MD  Patient coming from: home I have personally briefly reviewed patient's old medical records in [place]  Chief Complaint: cough x 6 months sob x 4days progressive  HPI: [name] is a 56 y.o. male with medical history significant of  ESRD on dialysis, paroxysmal atrial fibrillation, type II diabetes, hypertension, OSA no longer on CPAP, chronic diastolic heart failure ,GERD who presents to ED with complaint of cough x 6 months and sob progressive over the last 4 days. Patient notes associated chest pain , fever /chills/ n/v/ sick contacts or recent URI. He does noted chronic nonproductive cough that he is being evaluated for by pcp.   ED Course:  Patient on arrival noted to have sat of 86 % on ra in triage improved to mid 90's with [facility]  Vitals: Afeb, bp 205/120, hr 97, rr 30, sat 88% , 2L [place] 99%  TJQ:ZESPQ rhythm with pac Wbc: 11.5, hgb 11, plt 403  Na 135, K 4.3, gly 157, cr 14.48 Bnp 527 Lactic  1.2 CTPA MPRESSION: 1. No pulmonary embolism. 2. Extensive bilateral centrilobular nodular infiltrates in keeping with acute infection or, less likely given their distribution, aspiration in the acute setting. 3. Mild superimposed interstitial pulmonary edema and trace bilateral pleural effusions. 4. Mild cardiomegaly with left ventricular hypertrophy. Moderate coronary artery calcification. 5. Mild mediastinal and bilateral hilar lymphadenopathy, possibly reactive in nature. 6. 8 mm nodule within the right minor fissure, possibly representing intrapulmonary lymph node  Tx lasix 40 mg iv x 1, tussionex Respiratory panel neg Review of Systems: As per HPI otherwise 10 point review of systems negative.   Past Medical History:  Diagnosis Date   Acute kidney injury ([place]) 12/28/2014   Adjustment disorder with mixed anxiety and depressed mood 12/28/2014   CHF  (congestive heart failure) (HCC)    CPAP (continuous positive airway pressure) dependence    Diabetes mellitus without complication ([place])    DM (diabetes mellitus) type II controlled with renal manifestation ([place]) 12/28/2014   DM (diabetes mellitus) type II controlled, neurological manifestation ([place]) 12/28/2014   Encephalopathy, hypertensive 12/28/2014   GERD (gastroesophageal reflux disease)    Hypertension    Hypertensive emergency without congestive heart failure 12/27/2014   Pneumonia    Possible Panic disorder 12/28/2014   Renal disorder    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   Sleep apnea     Past Surgical History:  Procedure Laterality Date   AV FISTULA PLACEMENT Left 01/05/2020   Procedure: LEFT ARM BRACHIOCEPHALIC ARTERIOVENOUS (AV) FISTULA;  Surgeon: Angelia Mould, MD;  Location: [place];  Service: Vascular;  Laterality: Left;   EMPYEMA DRAINAGE Right 05/17/2017   Procedure: EMPYEMA DRAINAGE AND DECORTICATION;  Surgeon: Grace Isaac, MD;  Location: [place];  Service: Thoracic;  Laterality: Right;   FLEXIBLE BRONCHOSCOPY  05/17/2017   Procedure: FLEXIBLE BRONCHOSCOPY;  Surgeon: Grace Isaac, MD;  Location: [place];  Service: Thoracic;;   INSERTION OF DIALYSIS CATHETER Right 12/12/2021   Procedure: INSERTION OF RIGHT INTERNAL JUGULAR DIALYSIS CATHETER;  Surgeon: Angelia Mould, MD;  Location: [place];  Service: Vascular;  Laterality: Right;   IR FLUORO GUIDE CV LINE RIGHT  01/01/2020   IR THORACENTESIS ASP PLEURAL SPACE W/IMG GUIDE  05/17/2017   IR US GUIDE VASC ACCESS RIGHT  01/01/2020   NO PAST SURGERIES     REVISON OF ARTERIOVENOUS FISTULA Left 12/12/2021   Procedure:  REVISION OF LEFT ARM FISTULA BY PLICATION;  Surgeon: Angelia Mould, MD;  Location: [place];  Service: Vascular;  Laterality: Left;   THORACOTOMY/LOBECTOMY Right 05/17/2017   Procedure: RIGHT MINI THORACOTOMY;  Surgeon: Grace Isaac, MD;  Location: [place];  Service: Thoracic;  Laterality:  Right;   VIDEO ASSISTED THORACOSCOPY (VATS)/EMPYEMA Right 05/17/2017   Procedure: RIGHT VIDEO ASSISTED THORACOSCOPY (VATS)/EMPYEMA;  Surgeon: Grace Isaac, MD;  Location: [place];  Service: Thoracic;  Laterality: Right;     reports that he has never smoked. He has never been exposed to tobacco smoke. He has never used smokeless tobacco. He reports that he does not drink alcohol and does not use drugs.  No Known Allergies  Family History  Problem Relation Age of Onset   Hypertension Mother     Prior to Admission medications   Medication Sig Start Date End Date Taking? Authorizing Provider  Accu-Chek Softclix Lancets lancets Use as directed.  For insulin-dependent type 2 diabetes Monitor  glucose 3 times a day 01/06/20   Florencia Reasons, MD  acetaminophen (TYLENOL) 500 MG tablet Take 1,000-1,500 mg by mouth 2 (two) times daily as needed (pain).    [provider]  albuterol (PROVENTIL HFA;VENTOLIN HFA) 108 (90 BASE) MCG/ACT inhaler Inhale 2 puffs into the lungs every 6 (six) hours as needed for wheezing or shortness of breath.     [provider]  amLODipine (NORVASC) 10 MG tablet Take 1 tablet (10 mg total) by mouth daily. 01/06/20   Florencia Reasons, MD  apixaban (ELIQUIS) 5 MG TABS tablet Take 5 mg by mouth 2 (two) times daily.    [provider]  calcitRIOL (ROCALTROL) 0.25 MCG capsule Take 1 capsule (0.25 mcg total) by mouth every Monday, Wednesday, and Friday. 01/08/20   Florencia Reasons, MD  carvedilol (COREG) 25 MG tablet Take 1 tablet (25 mg total) by mouth 2 (two) times daily with a meal. 01/06/20   Florencia Reasons, MD  fluticasone [facility]) 50 MCG/ACT nasal spray Place 1 spray into both nostrils 2 (two) times daily. 10/06/19   [provider]  gabapentin (NEURONTIN) 300 MG capsule Take 1 capsule (300 mg total) by mouth daily. Patient taking differently: Take 300 mg by mouth 2 (two) times daily. 10/28/19   Georgette Shell, MD  glucose blood test strip Use as  instructed For insulin-dependent type 2 diabetes Monitor  glucose 3 times a day 01/06/20   Florencia Reasons, MD  HYDROcodone-acetaminophen (NORCO) 7.5-325 MG tablet Take 1 tablet by mouth every 4 (four) hours as needed. 12/11/19   [provider]  insulin NPH-regular Human (70-30) 100 UNIT/ML injection Inject 15 Units into the skin 2 (two) times daily. Ac breakfast and supper    [provider]  levocetirizine (XYZAL) 5 MG tablet Take 5 mg by mouth at bedtime. 10/06/19   [provider]  multivitamin (RENA-VIT) TABS tablet Take 1 tablet by mouth at bedtime. 01/06/20   Florencia Reasons, MD  oxyCODONE-acetaminophen (PERCOCET) 5-325 MG tablet Take 1-2 tablets by mouth every 4 (four) hours as needed for severe pain. 12/12/21 12/12/22  Angelia Mould, MD  sevelamer carbonate (RENVELA) 800 MG tablet Take 1 tablet (800 mg total) by mouth 3 (three) times daily with meals. 01/06/20   Florencia Reasons, MD  tiZANidine (ZANAFLEX) 4 MG tablet Take 4 mg by mouth every 8 (eight) hours as needed for muscle spasms.  04/08/17   [provider]  traMADol (ULTRAM) 50 MG tablet Take 50 mg by  mouth 3 (three) times daily as needed (pain).  08/17/19   [provider]  Vitamin D, Ergocalciferol, (DRISDOL) 1.25 MG (50000 UNIT) CAPS capsule Take 50,000 Units by mouth every Sunday. 08/17/19   [provider]    Physical Exam: Vitals:   03/18/22 1804 03/18/22 1806 03/18/22 1952 03/18/22 2100  BP: (!) 205/120  (!) 179/104 132/60  Pulse: 97 94 91 62  Resp: (!) _0 Temp: 98.7 F (37.1 C)     SpO2: (!) 88% 99% 100% 93%    Constitutional: NAD, calm, comfortable Vitals:   03/18/22 1804 03/18/22 1806 03/18/22 1952 03/18/22 2100  BP: (!) 205/120  (!) 179/104 132/60  Pulse: 97 94 91 62  Resp: (!) _1 Temp: 98.7 F (37.1 C)     SpO2: (!) 88% 99% 100% 93%   Eyes: PERRL, lids and conjunctivae normal ENMT: Mucous membranes are moist. Posterior pharynx clear of any exudate or  lesions.Normal dentition.  Neck: normal, supple, no masses, no thyromegaly Respiratory: diminished throughout, no wheezing, no crackles. Normal respiratory effort. No accessory muscle use.  Cardiovascular: Regular rate and rhythm, no murmurs / rubs / gallops. No extremity edema. 2+ pedal pulses. No carotid bruits.  Abdomen: no tenderness, no masses palpated. No hepatosplenomegaly. Bowel sounds positive.  Musculoskeletal: no clubbing / cyanosis. No joint deformity upper and lower extremities. Good ROM, no contractures. Normal muscle tone.  Skin: no rashes, lesions, ulcers. No induration Neurologic: CN 2-12 grossly intact. Sensation intact, Strength 5/5 in all 4.  Psychiatric: Normal judgment and insight. Alert and oriented x 3. Normal mood.    Labs on Admission: I have personally reviewed following labs and imaging studies  CBC: Recent Labs  Lab 03/18/22 1816  WBC 11.5*  NEUTROABS 9.1*  HGB 11.0*  HCT 33.2*  MCV 94.3  PLT 599*   Basic Metabolic Panel: Recent Labs  Lab 03/18/22 1816  NA 135  K 4.2  CL 99  CO2 22  GLUCOSE 157*  BUN 75*  CREATININE 14.48*  CALCIUM 9.5   GFR: CrCl cannot be calculated (Unknown ideal weight.). Liver Function Tests: No results for input(s): "AST", "ALT", "ALKPHOS", "BILITOT", "PROT", "ALBUMIN" in the last 168 hours. No results for input(s): "LIPASE", "AMYLASE" in the last 168 hours. No results for input(s): "AMMONIA" in the last 168 hours. Coagulation Profile: No results for input(s): "INR", "PROTIME" in the last 168 hours. Cardiac Enzymes: No results for input(s): "CKTOTAL", "CKMB", "CKMBINDEX", "TROPONINI" in the last 168 hours. BNP (last 3 results) No results for input(s): "PROBNP" in the last 8760 hours. HbA1C: No results for input(s): "HGBA1C" in the last 72 hours. CBG: No results for input(s): "GLUCAP" in the last 168 hours. Lipid Profile: No results for input(s): "CHOL", "HDL", "LDLCALC", "TRIG", "CHOLHDL", "LDLDIRECT" in the  last 72 hours. Thyroid Function Tests: No results for input(s): "TSH", "T4TOTAL", "FREET4", "T3FREE", "THYROIDAB" in the last 72 hours. Anemia Panel: No results for input(s): "VITAMINB12", "FOLATE", "FERRITIN", "TIBC", "IRON", "RETICCTPCT" in the last 72 hours. Urine analysis:    Component Value Date/Time   COLORURINE STRAW (A) 12/30/2019 1721   APPEARANCEUR CLEAR 12/30/2019 1721   LABSPEC 1.011 12/30/2019 1721   PHURINE 5.0 12/30/2019 1721   GLUCOSEU 50 (A) 12/30/2019 1721   HGBUR NEGATIVE 12/30/2019 1721   BILIRUBINUR NEGATIVE 12/30/2019 1721   KETONESUR NEGATIVE 12/30/2019 1721   PROTEINUR 100 (A) 12/30/2019 1721   UROBILINOGEN 0.2 03/26/2014 1626   NITRITE NEGATIVE 12/30/2019 1721   LEUKOCYTESUR NEGATIVE  12/30/2019 1721    Radiological Exams on Admission: DG Chest 2 View  Result Date: 03/18/2022 CLINICAL DATA:  Shortness of breath. EXAM: CHEST - 2 VIEW COMPARISON:  12/20/2021 FINDINGS: Previous dialysis catheter is been removed. The heart is mildly enlarged. Small pleural effusions with fluid in the fissures. There is pulmonary edema with septal thickening peripherally. No confluent consolidation. No pneumothorax. No acute osseous findings. IMPRESSION: Findings consistent with CHF. Cardiomegaly with pulmonary edema and small pleural effusions. Electronically Signed   By: Keith Rake M.D.   On: 03/18/2022 18:38    EKG: Independently reviewed. See above  Assessment/Plan  -CAP, atypical bacterial with acute hypoxic respiratory failure -patient with increase wbc and  Opacities on CT  -ctx/ azithromycin, de-escalate as able  -pulmonary toilet  -urine ag, sputum, f/u on culture data    -full respiratory panel  -wean O2 as able  -if patient has no improvement consider pulmonary consult   Fluid overload  -in setting of ESRD on  HD MWF ,Chronic diastolic heart failure with possible exacerbation -due to being ill missed last HD session  -HD in am  -renal consulted  -  lasix iv bid , HD to manage fluid in am  - continue carvediolol   Uncontrolled HTN  -in setting of fluid overload -resume home regimen ( Carvedilol, Amlodpine) - HD in am  -prn anti-hypertensive medications  Paroxysmal atrial fibrillation -currently in sinus  - continue Eliquis,Carvediolol  DMII - last A1c 7.6  -repeat pending for am  - iss/fs / resume NPH -renal ada /diet   OSA  -no longer on CPAP   GERD  -ppi   DVT prophylaxis: Eliquis Code Status:full Family Communication:  [place] (Mother) [phone] (Mobile)  At bedside Disposition Plan: patient  expected to be admitted greater than 2 midnights  Consults called:  Dr Augustin Coupe , Nephrology  Admission status: progressive care    Clance Boll MD [facility]   If 7PM-7AM, please contact night-coverage [email or web address] Password [facility]  03/18/2022, 10:24 PM

## 2022-03-18 NOTE — ED Notes (Signed)
ED Provider at bedside. 

## 2022-03-18 NOTE — ED Triage Notes (Signed)
Patient here for evaluation of nonproductive cough and shortness that started approximately six months ago. Patient's room air saturation in triage 86% on room air, placed on 2L [place]. Patient is alert, oriented, and in no apparent distress at this time.

## 2022-03-18 NOTE — ED Provider Triage Note (Signed)
Emergency Medicine Provider Triage Evaluation Note  [name] , a 56 y.o. male  was evaluated in triage.  Pt complains of worsening shortness of breath over the past 6 months.  Patient complains of a dry cough which is worse at night.  Patient states he sleeps on pillows at night.  Patient states that he has been told to try over-the-counter medication with little to no relief.  He states that his symptoms have been worsening.  He denies chest pain, lower extremity swelling.  Patient is a Monday Wednesday Friday dialysis patient and missed his dialysis session on Friday due to not feeling well.  Patient also had history of remote MI in 1998.  Review of Systems  Positive: As above Negative: As above  Physical Exam  BP (!) 205/120 (BP Location: Right Arm)   Pulse 94   Temp 98.7 F (37.1 C)   Resp (!) 30   SpO2 99%  Gen:   Awake, no distress   Resp:  Normal effort  MSK:   Moves extremities without difficulty  Other:    Medical Decision Making  Medically screening exam initiated at 6:09 PM.  Appropriate orders placed.  [name] was informed that the remainder of the evaluation will be completed by another provider, this initial triage assessment does not replace that evaluation, and the importance of remaining in the ED until their evaluation is complete.  Patient initially hypoxic with saturations in the 80s. Patient placed on 3 lpm O2 via [place]   Dorothyann Peng, [place] 03/18/22 1810

## 2022-03-18 NOTE — ED Provider Notes (Signed)
[facility] EMERGENCY DEPARTMENT Provider Note   CSN: [id number] Arrival date & time: 03/18/22  1756     History  Chief Complaint  Patient presents with   Shortness of Breath    [name] is a 56 y.o. male with a past medical history of ESRD MWF dialysis, uncontrolled hypertension, type 2 diabetes, COPD and other comorbidities presenting today due to shortness of breath.  He reports that he has had a cough for the past month but over the past couple of days he has become increasingly dyspneic.  Has no history of PE/DVT.  No recent travel or surgery.  He has paroxysmal A-fib for which she is anticoagulated with Eliquis.  He is a Monday Wednesday Friday dialysis patient and reports missing his treatment on Friday due to his cough and not feeling well.  Reports that the cough is occasionally productive of clear sputum.  He reports being on 3 years of hemodialysis and still makes urine.   Shortness of Breath Associated symptoms: no fever        Home Medications Prior to Admission medications   Medication Sig Start Date End Date Taking? Authorizing Provider  Accu-Chek Softclix Lancets lancets Use as directed.  For insulin-dependent type 2 diabetes Monitor  glucose 3 times a day 01/06/20   Florencia Reasons, MD  acetaminophen (TYLENOL) 500 MG tablet Take 1,000-1,500 mg by mouth 2 (two) times daily as needed (pain).    [provider]  albuterol (PROVENTIL HFA;VENTOLIN HFA) 108 (90 BASE) MCG/ACT inhaler Inhale 2 puffs into the lungs every 6 (six) hours as needed for wheezing or shortness of breath.     [provider]  amLODipine (NORVASC) 10 MG tablet Take 1 tablet (10 mg total) by mouth daily. 01/06/20   Florencia Reasons, MD  apixaban (ELIQUIS) 5 MG TABS tablet Take 5 mg by mouth 2 (two) times daily.    [provider]  calcitRIOL (ROCALTROL) 0.25 MCG capsule Take 1 capsule (0.25 mcg total) by mouth every Monday, Wednesday, and Friday. 01/08/20   Florencia Reasons,  MD  carvedilol (COREG) 25 MG tablet Take 1 tablet (25 mg total) by mouth 2 (two) times daily with a meal. 01/06/20   Florencia Reasons, MD  fluticasone [facility]) 50 MCG/ACT nasal spray Place 1 spray into both nostrils 2 (two) times daily. 10/06/19   [provider]  gabapentin (NEURONTIN) 300 MG capsule Take 1 capsule (300 mg total) by mouth daily. Patient taking differently: Take 300 mg by mouth 2 (two) times daily. 10/28/19   Georgette Shell, MD  glucose blood test strip Use as instructed For insulin-dependent type 2 diabetes Monitor  glucose 3 times a day 01/06/20   Florencia Reasons, MD  HYDROcodone-acetaminophen (NORCO) 7.5-325 MG tablet Take 1 tablet by mouth every 4 (four) hours as needed. 12/11/19   [provider]  insulin NPH-regular Human (70-30) 100 UNIT/ML injection Inject 15 Units into the skin 2 (two) times daily. Ac breakfast and supper    [provider]  levocetirizine (XYZAL) 5 MG tablet Take 5 mg by mouth at bedtime. 10/06/19   [provider]  multivitamin (RENA-VIT) TABS tablet Take 1 tablet by mouth at bedtime. 01/06/20   Florencia Reasons, MD  oxyCODONE-acetaminophen (PERCOCET) 5-325 MG tablet Take 1-2 tablets by mouth every 4 (four) hours as needed for severe pain. 12/12/21 12/12/22  Angelia Mould, MD  sevelamer carbonate (RENVELA) 800 MG tablet Take 1 tablet (800 mg total) by mouth 3 (three) times daily  with meals. 01/06/20   Florencia Reasons, MD  tiZANidine (ZANAFLEX) 4 MG tablet Take 4 mg by mouth every 8 (eight) hours as needed for muscle spasms.  04/08/17   [provider]  traMADol (ULTRAM) 50 MG tablet Take 50 mg by mouth 3 (three) times daily as needed (pain).  08/17/19   [provider]  Vitamin D, Ergocalciferol, (DRISDOL) 1.25 MG (50000 UNIT) CAPS capsule Take 50,000 Units by mouth every Sunday. 08/17/19   [provider]      Allergies    Patient has no known allergies.    Review of Systems   Review of Systems  Constitutional:   Negative for fever.  HENT:  Positive for congestion.   Respiratory:  Positive for shortness of breath.   Cardiovascular:  Positive for leg swelling.    Physical Exam Updated Vital Signs BP (!) 179/104   Pulse 91   Temp 98.7 F (37.1 C)   Resp 18   SpO2 100%  Physical Exam Vitals and nursing note reviewed.  Constitutional:      General: He is not in acute distress.    Appearance: Normal appearance. He is ill-appearing. He is not diaphoretic.  HENT:     Head: Normocephalic and atraumatic.  Eyes:     General: No scleral icterus.    Conjunctiva/sclera: Conjunctivae normal.  Cardiovascular:     Rate and Rhythm: Normal rate. Rhythm irregular.  Pulmonary:     Effort: Tachypnea and accessory muscle usage (After coughing bouts) present. No respiratory distress.     Breath sounds: Decreased breath sounds present. No wheezing or rhonchi.  Musculoskeletal:     Right lower leg: No edema.     Left lower leg: No edema.  Skin:    General: Skin is warm and dry.     Findings: No rash.  Neurological:     Mental Status: He is alert.  Psychiatric:        Mood and Affect: Mood normal.     ED Results / Procedures / Treatments   Labs (all labs ordered are listed, but only abnormal results are displayed) Labs Reviewed  BASIC METABOLIC PANEL - Abnormal; Notable for the following components:      Result Value   Glucose, Bld 157 (*)    BUN 75 (*)    Creatinine, Ser 14.48 (*)    GFR, Estimated 4 (*)    All other components within normal limits  CBC WITH DIFFERENTIAL/PLATELET - Abnormal; Notable for the following components:   WBC 11.5 (*)    RBC 3.52 (*)    Hemoglobin 11.0 (*)    HCT 33.2 (*)    Platelets 403 (*)    Neutro Abs 9.1 (*)    Abs Immature Granulocytes 0.08 (*)    All other components within normal limits  BRAIN NATRIURETIC PEPTIDE - Abnormal; Notable for the following components:   B Natriuretic Peptide 527.8 (*)    All other components within normal limits     EKG EKG Interpretation  Date/Time:  Sunday March 18 2022 18:07:11 EST Ventricular Rate:  95 PR Interval:  158 QRS Duration: 86 QT Interval:  374 QTC Calculation: 469 R Axis:   -20 Text Interpretation: Sinus rhythm with Premature atrial complexes Possible Left atrial enlargement Borderline ECG When compared with ECG of 20-Dec-2021 12:18, No significant change since last tracing Confirmed by Aletta Edouard [phone]) on 03/18/2022 8:38:57 PM  Radiology DG Chest 2 View  Result Date: 03/18/2022 CLINICAL DATA:  Shortness of breath.  EXAM: CHEST - 2 VIEW COMPARISON:  12/20/2021 FINDINGS: Previous dialysis catheter is been removed. The heart is mildly enlarged. Small pleural effusions with fluid in the fissures. There is pulmonary edema with septal thickening peripherally. No confluent consolidation. No pneumothorax. No acute osseous findings. IMPRESSION: Findings consistent with CHF. Cardiomegaly with pulmonary edema and small pleural effusions. Electronically Signed   By: Keith Rake M.D.   On: 03/18/2022 18:38    Procedures Procedures   Medications Ordered in ED Medications - No data to display  ED Course/ Medical Decision Making/ A&P Clinical Course as of 03/18/22 2221  Nancy Fetter [birth year]  4549 56 year old male end-stage renal disease dialysis Monday Wednesday Friday missed Friday and is here with cough shortness of breath.  He said he had a line in that was recently removed and they are using his fistula now.  Was hypoxic at triage and improved on nasal cannula.  Will review with nephrology regarding getting dialysis. [MB]    Clinical Course User Index [MB] Hayden Rasmussen, MD                           Medical Decision Making Amount and/or Complexity of Data Reviewed Labs: ordered. Radiology: ordered.  Risk Prescription drug management. Decision regarding hospitalization.   Unfortunate 56 year old presenting today due to cough and shortness of breath. The emergent  differential diagnosis of chest pain includes: Acute coronary syndrome, pericarditis, aortic dissection, pulmonary embolism, tension pneumothorax, and esophageal rupture.  This is not an exhaustive differential.    Past Medical History / Co-morbidities / Social History: ESRD on dialysis, uncontrolled hypertension, type 2 diabetes   Additional history: I reviewed patient's chart and see that his last cardiac echo was in 2019 with an EF of 60 to 65%.   Physical Exam: Pertinent physical exam findings include Decreased lung sounds in all lung fields No appreciable lower extremity edema A-fib, normal rate Pleural icterus Overall chronically ill-appearing  Lab Tests: I ordered, and personally interpreted labs.  The pertinent results include: BNP 527.8 Creatinine 14.48, GFR 4, normal potassium   Imaging Studies: Chest x-ray ordered in triage.  I viewed and interpreted this and agree that patient likely has some degree of pulmonary edema   Cardiac Monitoring:  The patient was maintained on a cardiac monitor.  I viewed and interpreted the cardiac monitored which showed an underlying rhythm of: A-fib with a normal rate   Medications: I ordered medication including Tussidex   Consultations Obtained: I spoke with Dr. Augustin Coupe with nephrology who has the patient on the list for dialysis tomorrow morning.   MDM/Disposition: This is a 56 year old male presenting today due to cough and shortness of breath.  Cough has been going on for months but has been acutely worsening and now he is having shortness of breath.  He is a Monday Wednesday Friday dialysis patient and missed dialysis on Friday.  When he arrived to the emergency department his room air oxygen saturation was in the mid 80s.  He required 2 L of oxygen to stay above 90.  He does not wear oxygen at baseline.  On my assessment patient had oxygen saturations in the low 90s on room air while resting however it dropped to upper 80s during  coughing fits.  At this time I believe patient needs admission for his acute on chronic renal failure resulting in fluid overload and pulmonary edema.  Question concomitant PE, after speaking with hospitalist this was ordered  as well.  Patient admitted to Dr. Marcello Moores.    I discussed this case with my attending physician Dr. Melina Copa who cosigned this note including patient's presenting symptoms, physical exam, and planned diagnostics and interventions. Attending physician stated agreement with plan or made changes to plan which were implemented.      Final Clinical Impression(s) / ED Diagnoses Final diagnoses:  Acute renal failure superimposed on chronic kidney disease, on chronic dialysis, unspecified acute renal failure type ([place])  Acute pulmonary edema [facility])    Rx / DC Orders ED Discharge Orders     None      Admit to Dr. Emi Holes, Cecilio Asper, PA-C 03/18/22 2226    Hayden Rasmussen, MD 03/19/22 [id number]

## 2022-03-19 ENCOUNTER — Encounter (HOSPITAL_COMMUNITY): Payer: Self-pay | Admitting: Internal Medicine

## 2022-03-19 ENCOUNTER — Inpatient Hospital Stay (HOSPITAL_COMMUNITY): Payer: Medicare Other

## 2022-03-19 ENCOUNTER — Other Ambulatory Visit: Payer: Self-pay

## 2022-03-19 DIAGNOSIS — J810 Acute pulmonary edema: Secondary | ICD-10-CM | POA: Insufficient documentation

## 2022-03-19 DIAGNOSIS — E8770 Fluid overload, unspecified: Secondary | ICD-10-CM | POA: Diagnosis not present

## 2022-03-19 LAB — RESPIRATORY PANEL BY PCR

## 2022-03-19 LAB — COMPREHENSIVE METABOLIC PANEL
ALT: 8 U/L (ref 0–44)
AST: 12 U/L — ABNORMAL LOW (ref 15–41)
Albumin: 2.9 g/dL — ABNORMAL LOW (ref 3.5–5.0)
Alkaline Phosphatase: 77 U/L (ref 38–126)
Anion gap: 17 — ABNORMAL HIGH (ref 5–15)
BUN: 79 mg/dL — ABNORMAL HIGH (ref 6–20)
CO2: 21 mmol/L — ABNORMAL LOW (ref 22–32)
Calcium: 9.3 mg/dL (ref 8.9–10.3)
Chloride: 95 mmol/L — ABNORMAL LOW (ref 98–111)
Creatinine, Ser: 15.25 mg/dL — ABNORMAL HIGH (ref 0.61–1.24)
GFR, Estimated: 3 mL/min — ABNORMAL LOW (ref >60)
Glucose, Bld: 180 mg/dL — ABNORMAL HIGH (ref 70–99)
Potassium: 4.1 mmol/L (ref 3.5–5.1)
Sodium: 133 mmol/L — ABNORMAL LOW (ref 135–145)
Total Bilirubin: 0.6 mg/dL (ref 0.3–1.2)
Total Protein: 6.5 g/dL (ref 6.5–8.1)

## 2022-03-19 LAB — BLOOD GAS, VENOUS
Acid-base deficit: 0.8 mmol/L (ref 0.0–2.0)
Bicarbonate: 24.9 mmol/L (ref 20.0–28.0)
O2 Saturation: 79.6 %
Patient temperature: 37
pCO2, Ven: 44 mmHg (ref 44–60)
pH, Ven: 7.36 (ref 7.25–7.43)
pO2, Ven: 49 mmHg — ABNORMAL HIGH (ref 32–45)

## 2022-03-19 LAB — CBC WITH DIFFERENTIAL/PLATELET
Abs Immature Granulocytes: 0.06 10*3/uL (ref 0.00–0.07)
Basophils Absolute: 0.1 10*3/uL (ref 0.0–0.1)
Basophils Relative: 0 %
Eosinophils Absolute: 0.1 10*3/uL (ref 0.0–0.5)
Eosinophils Relative: 1 %
HCT: 31.1 % — ABNORMAL LOW (ref 39.0–52.0)
Hemoglobin: 10.3 g/dL — ABNORMAL LOW (ref 13.0–17.0)
Immature Granulocytes: 1 %
Lymphocytes Relative: 18 %
Lymphs Abs: 2 10*3/uL (ref 0.7–4.0)
MCH: 31.5 pg (ref 26.0–34.0)
MCHC: 33.1 g/dL (ref 30.0–36.0)
MCV: 95.1 fL (ref 80.0–100.0)
Monocytes Absolute: 0.6 10*3/uL (ref 0.1–1.0)
Monocytes Relative: 6 %
Neutro Abs: 8.6 10*3/uL — ABNORMAL HIGH (ref 1.7–7.7)
Neutrophils Relative %: 74 %
Platelets: 373 10*3/uL (ref 150–400)
RBC: 3.27 MIL/uL — ABNORMAL LOW (ref 4.22–5.81)
RDW: 13.2 % (ref 11.5–15.5)
WBC: 11.5 10*3/uL — ABNORMAL HIGH (ref 4.0–10.5)
nRBC: 0 % (ref 0.0–0.2)

## 2022-03-19 LAB — HEPATITIS B SURFACE ANTIGEN: Hepatitis B Surface Ag: NONREACTIVE

## 2022-03-19 LAB — HIV ANTIBODY (ROUTINE TESTING W REFLEX): HIV Screen 4th Generation wRfx: NONREACTIVE

## 2022-03-19 LAB — URINALYSIS, ROUTINE W REFLEX MICROSCOPIC
Bacteria, UA: NONE SEEN
Bilirubin Urine: NEGATIVE
Glucose, UA: 150 mg/dL — AB
Ketones, ur: NEGATIVE mg/dL
Leukocytes,Ua: NEGATIVE
Nitrite: NEGATIVE
Protein, ur: 100 mg/dL — AB
Specific Gravity, Urine: 1.013 (ref 1.005–1.030)
pH: 7 (ref 5.0–8.0)

## 2022-03-19 LAB — LACTIC ACID, PLASMA: Lactic Acid, Venous: 1.3 mmol/L (ref 0.5–1.9)

## 2022-03-19 LAB — STREP PNEUMONIAE URINARY ANTIGEN: Strep Pneumo Urinary Antigen: NEGATIVE

## 2022-03-19 LAB — RESP PANEL BY RT-PCR (FLU A&B, COVID) ARPGX2
Influenza A by PCR: NEGATIVE
Influenza B by PCR: NEGATIVE
SARS Coronavirus 2 by RT PCR: NEGATIVE

## 2022-03-19 LAB — PHOSPHORUS: Phosphorus: 7.3 mg/dL — ABNORMAL HIGH (ref 2.5–4.6)

## 2022-03-19 LAB — PROCALCITONIN: Procalcitonin: 0.38 ng/mL

## 2022-03-19 LAB — C-REACTIVE PROTEIN: CRP: 12.8 mg/dL — ABNORMAL HIGH (ref <1.0)

## 2022-03-19 LAB — FERRITIN: Ferritin: 600 ng/mL — ABNORMAL HIGH (ref 24–336)

## 2022-03-19 MED ORDER — LORATADINE 10 MG PO TABS
10.0000 mg | ORAL_TABLET | Freq: Every day | ORAL | Status: DC
  Administered 2022-03-19 – 2022-03-22 (×4): 10 mg via ORAL
  Filled 2022-03-19 (×4): qty 1

## 2022-03-19 MED ORDER — CINACALCET HCL 30 MG PO TABS
180.0000 mg | ORAL_TABLET | ORAL | Status: DC
  Administered 2022-03-21: 180 mg via ORAL
  Filled 2022-03-19: qty 6

## 2022-03-19 MED ORDER — CALCIUM ACETATE (PHOS BINDER) 667 MG PO CAPS
667.0000 mg | ORAL_CAPSULE | Freq: Three times a day (TID) | ORAL | Status: DC
  Administered 2022-03-19 – 2022-03-22 (×7): 667 mg via ORAL
  Filled 2022-03-19 (×7): qty 1

## 2022-03-19 MED ORDER — GABAPENTIN 300 MG PO CAPS
300.0000 mg | ORAL_CAPSULE | Freq: Every day | ORAL | Status: DC
  Administered 2022-03-19 – 2022-03-22 (×4): 300 mg via ORAL
  Filled 2022-03-19 (×4): qty 1

## 2022-03-19 MED ORDER — HYDRALAZINE HCL 20 MG/ML IJ SOLN
20.0000 mg | Freq: Once | INTRAMUSCULAR | Status: AC
  Administered 2022-03-19: 20 mg via INTRAVENOUS
  Filled 2022-03-19: qty 1

## 2022-03-19 MED ORDER — ACETAMINOPHEN 325 MG PO TABS
650.0000 mg | ORAL_TABLET | Freq: Four times a day (QID) | ORAL | Status: DC | PRN
  Administered 2022-03-19: 650 mg via ORAL
  Filled 2022-03-19 (×2): qty 2

## 2022-03-19 MED ORDER — IOHEXOL 350 MG/ML SOLN
75.0000 mL | Freq: Once | INTRAVENOUS | Status: AC | PRN
  Administered 2022-03-19: 75 mL via INTRAVENOUS

## 2022-03-19 MED ORDER — HEPARIN SODIUM (PORCINE) 1000 UNIT/ML DIALYSIS
4000.0000 [IU] | Freq: Once | INTRAMUSCULAR | Status: AC
  Administered 2022-03-19: 4000 [IU] via INTRAVENOUS_CENTRAL
  Filled 2022-03-19: qty 4

## 2022-03-19 MED ORDER — CARVEDILOL 25 MG PO TABS
25.0000 mg | ORAL_TABLET | Freq: Two times a day (BID) | ORAL | Status: DC
  Administered 2022-03-19 – 2022-03-22 (×6): 25 mg via ORAL
  Filled 2022-03-19: qty 2
  Filled 2022-03-19 (×4): qty 1
  Filled 2022-03-19: qty 2

## 2022-03-19 MED ORDER — RENA-VITE PO TABS
1.0000 | ORAL_TABLET | Freq: Every day | ORAL | Status: DC
  Administered 2022-03-19 – 2022-03-21 (×3): 1 via ORAL
  Filled 2022-03-19 (×4): qty 1

## 2022-03-19 MED ORDER — CHLORHEXIDINE GLUCONATE CLOTH 2 % EX PADS
6.0000 | MEDICATED_PAD | Freq: Every day | CUTANEOUS | Status: DC
  Administered 2022-03-20 – 2022-03-22 (×3): 6 via TOPICAL

## 2022-03-19 MED ORDER — AMLODIPINE BESYLATE 10 MG PO TABS
10.0000 mg | ORAL_TABLET | Freq: Every day | ORAL | Status: DC
  Administered 2022-03-19 – 2022-03-22 (×4): 10 mg via ORAL
  Filled 2022-03-19 (×2): qty 1
  Filled 2022-03-19: qty 2
  Filled 2022-03-19: qty 1

## 2022-03-19 MED ORDER — APIXABAN 5 MG PO TABS
5.0000 mg | ORAL_TABLET | Freq: Two times a day (BID) | ORAL | Status: DC
  Administered 2022-03-19 – 2022-03-22 (×7): 5 mg via ORAL
  Filled 2022-03-19 (×7): qty 1

## 2022-03-19 MED ORDER — DOXERCALCIFEROL 4 MCG/2ML IV SOLN: 7.0000 ug | INTRAVENOUS | Status: DC

## 2022-03-19 NOTE — Progress Notes (Signed)
Heart Failure Navigator Progress Note  Assessed for Heart & Vascular TOC clinic readiness.  Patient does not meet criteria due to ESRD on hemodialysis.   Navigator will sign off at this time.    Shalen Petrak, BSN, RN Heart Failure Nurse Navigator Secure Chat Only   

## 2022-03-19 NOTE — ED Notes (Signed)
Patient transported to CT 

## 2022-03-19 NOTE — Consult Note (Signed)
Renal Service Consult Note [facility]  [place] 03/19/2022 Sol Blazing, MD Requesting Physician: Dr. Dwyane Dee  Reason for Consult: ESRD pt w/ SOB, CAP HPI: The patient is a 56 y.o. year-old w/ PMH as below who presented to ED c/o cough for 6 mos and SOB x 4 days. MIssed Friday HD due to feeling "bad". Compliance is normally good w/ HD. CXR and CTA chest done and IV abx started w/ nasal O2. We are asked to see for dialysis.   Pt missed Friday d/t feeling sick. On HD 3 yrs, drives himself to HD. Lives w/ dtr and GF. Main c/o's have been SOB and chronic nonproductive cough. CXR shows mild / early pulm edema. CTA chest showed no PE and extensive centrilobular nodular infiltrates suggesting acute infection.    ROS - denies CP, no joint pain, no HA, no blurry vision, no rash, no diarrhea, no nausea/ vomiting, no dysuria, no difficulty voiding   Past Medical History  Past Medical History:  Diagnosis Date   Acute kidney injury ([place]) 12/28/2014   Adjustment disorder with mixed anxiety and depressed mood 12/28/2014   CHF (congestive heart failure) ([facility])    CPAP (continuous positive airway pressure) dependence    Diabetes mellitus without complication ([place])    DM (diabetes mellitus) type II controlled with renal manifestation ([place]) 12/28/2014   DM (diabetes mellitus) type II controlled, neurological manifestation ([place]) 12/28/2014   Encephalopathy, hypertensive 12/28/2014   GERD (gastroesophageal reflux disease)    Hypertension    Hypertensive emergency without congestive heart failure 12/27/2014   Pneumonia    Possible Panic disorder 12/28/2014   Renal disorder    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   Sleep apnea    Past Surgical History  Past Surgical History:  Procedure Laterality Date   AV FISTULA PLACEMENT Left 01/05/2020   Procedure: LEFT ARM BRACHIOCEPHALIC ARTERIOVENOUS (AV) FISTULA;  Surgeon: Angelia Mould, MD;  Location: [place];  Service: Vascular;   Laterality: Left;   EMPYEMA DRAINAGE Right 05/17/2017   Procedure: EMPYEMA DRAINAGE AND DECORTICATION;  Surgeon: Grace Isaac, MD;  Location: [facility];  Service: Thoracic;  Laterality: Right;   FLEXIBLE BRONCHOSCOPY  05/17/2017   Procedure: FLEXIBLE BRONCHOSCOPY;  Surgeon: Grace Isaac, MD;  Location: [place];  Service: Thoracic;;   INSERTION OF DIALYSIS CATHETER Right 12/12/2021   Procedure: INSERTION OF RIGHT INTERNAL JUGULAR DIALYSIS CATHETER;  Surgeon: Angelia Mould, MD;  Location: [place];  Service: Vascular;  Laterality: Right;   IR FLUORO GUIDE CV LINE RIGHT  01/01/2020   IR THORACENTESIS ASP PLEURAL SPACE W/IMG GUIDE  05/17/2017   IR US GUIDE VASC ACCESS RIGHT  01/01/2020   NO PAST SURGERIES     REVISON OF ARTERIOVENOUS FISTULA Left 12/12/2021   Procedure: REVISION OF LEFT ARM FISTULA BY PLICATION;  Surgeon: Angelia Mould, MD;  Location: [place];  Service: Vascular;  Laterality: Left;   THORACOTOMY/LOBECTOMY Right 05/17/2017   Procedure: RIGHT MINI THORACOTOMY;  Surgeon: Grace Isaac, MD;  Location: [facility];  Service: Thoracic;  Laterality: Right;   VIDEO ASSISTED THORACOSCOPY (VATS)/EMPYEMA Right 05/17/2017   Procedure: RIGHT VIDEO ASSISTED THORACOSCOPY (VATS)/EMPYEMA;  Surgeon: Grace Isaac, MD;  Location: [facility];  Service: Thoracic;  Laterality: Right;   Family History  Family History  Problem Relation Age of Onset   Hypertension Mother    Social History  reports that he has never smoked. He has never been exposed to tobacco smoke. He has never used smokeless tobacco.  He reports that he does not drink alcohol and does not use drugs. Allergies No Known Allergies Home medications Prior to Admission medications   Medication Sig Start Date End Date Taking? Authorizing Provider  acetaminophen (TYLENOL) 500 MG tablet Take 1,000-1,500 mg by mouth 2 (two) times daily as needed (pain).   Yes [provider]  ADVAIR HFA 115-21 MCG/ACT inhaler Inhale 1 puff into  the lungs 2 (two) times daily. 03/15/22  Yes [provider]  albuterol (PROVENTIL HFA;VENTOLIN HFA) 108 (90 BASE) MCG/ACT inhaler Inhale 2 puffs into the lungs every 6 (six) hours as needed for wheezing or shortness of breath.    Yes [provider]  amLODipine (NORVASC) 10 MG tablet Take 1 tablet (10 mg total) by mouth daily. 01/06/20  Yes Florencia Reasons, MD  apixaban (ELIQUIS) 5 MG TABS tablet Take 5 mg by mouth 2 (two) times daily.   Yes [provider]  benzonatate (TESSALON) 100 MG capsule Take 100 mg by mouth 3 (three) times daily as needed for cough. 03/15/22  Yes [provider]  calcitRIOL (ROCALTROL) 0.25 MCG capsule Take 1 capsule (0.25 mcg total) by mouth every Monday, Wednesday, and Friday. 01/08/20  Yes Florencia Reasons, MD  Calcium Acetate 667 MG TABS Take 667 mg by mouth with breakfast, with lunch, and with evening meal.   Yes [provider]  carvedilol (COREG) 25 MG tablet Take 1 tablet (25 mg total) by mouth 2 (two) times daily with a meal. 01/06/20  Yes Florencia Reasons, MD  cetirizine (ZYRTEC) 10 MG tablet Take 10 mg by mouth daily. 03/15/22  Yes [provider]  fluticasone (FLONASE) 50 MCG/ACT nasal spray Place 1 spray into both nostrils 2 (two) times daily. 10/06/19  Yes [provider]  gabapentin (NEURONTIN) 300 MG capsule Take 1 capsule (300 mg total) by mouth daily. Patient taking differently: Take 300 mg by mouth 2 (two) times daily. 10/28/19  Yes Georgette Shell, MD  insulin NPH-regular Human (70-30) 100 UNIT/ML injection Inject 15 Units into the skin 2 (two) times daily. Ac breakfast and supper   Yes [provider]  multivitamin (RENA-VIT) TABS tablet Take 1 tablet by mouth at bedtime. 01/06/20  Yes Florencia Reasons, MD  tiZANidine (ZANAFLEX) 4 MG tablet Take 4 mg by mouth every 8 (eight) hours as needed for muscle spasms.  04/08/17  Yes [provider]  traMADol (ULTRAM) 50 MG tablet Take 50 mg by mouth daily as needed  (pain). 08/17/19  Yes [provider]  Accu-Chek Softclix Lancets lancets Use as directed.  For insulin-dependent type 2 diabetes Monitor  glucose 3 times a day 01/06/20   Florencia Reasons, MD  glucose blood test strip Use as instructed For insulin-dependent type 2 diabetes Monitor  glucose 3 times a day 01/06/20   Florencia Reasons, MD  oxyCODONE-acetaminophen (PERCOCET) 5-325 MG tablet Take 1-2 tablets by mouth every 4 (four) hours as needed for severe pain. Patient not taking: Reported on 03/18/2022 12/12/21 12/12/22  Angelia Mould, MD  sevelamer carbonate (RENVELA) 800 MG tablet Take 1 tablet (800 mg total) by mouth 3 (three) times daily with meals. Patient not taking: Reported on 03/18/2022 01/06/20   Florencia Reasons, MD  Vitamin D, Ergocalciferol, (DRISDOL) 1.25 MG (50000 UNIT) CAPS capsule Take 50,000 Units by mouth every Sunday. 08/17/19   [provider]     Vitals:   03/19/22 0645 03/19/22 0700 03/19/22 0800 03/19/22 0954  BP: (!) 194/107 (!) 200/105 (!) 199/96 (!) 184/99  Pulse: 94 95 96   Resp: (!) _0 Temp:   98 F (36.7 C)   TempSrc:      SpO2: 92% 94% 94%   Weight:      Height:       Exam Gen alert, no distress No rash, cyanosis or gangrene Sclera anicteric, throat clear  No jvd or bruits Chest mild bilat rales at bases, no wheezing RRR no MRG Abd soft ntnd no mass or ascites +bs GU normal male MS no joint effusions or deformity Ext no LE or UE edema, no wounds or ulcers Neuro is alert, Ox 3 , nf    LUA AVF+bruit   Home meds include - norvasc 10, eliquis , phoslo 1 ac, coreg 25 bid, neurontin 300 qd, renavite, percocet prn, prns/ vits/ supps   CXR - IMPRESSION: Findings consistent with CHF. Cardiomegaly with pulmonary edema and small pleural effusions.   OP HD: SW MWF 4h 32mn   500/1.5   103.5kg  2/2 bath  Hep 4100   LUA AVF - high wt gains - last HD 12/6 , post wt 104.7 - hectorol 7 ug IV tiw - sensipar 180 po tiw - no esa, last  Hb11.0   Assessment/ Plan: AHRF - CXR + pulm edema and CTA showing multifocal PNA. IV abx per pmd. Plan HD asap this afternoon w/ max UF as tolerated.  ESRD- on HD MWF. Missed HD last Friday. HD today as above.  BP/ vol - BP's high, vol overloaded, max UF w/ HD  MBD ckd - CCa in range, add on phos. Cont IV vdra and sensipar and phoslo as binder.  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 Follow DM2 - per pmd      RKelly Splinter MD 03/19/2022, 10:16 AM Recent Labs  Lab 03/18/22 1816 03/19/22 0305  HGB 11.0* 10.3*  ALBUMIN  --  2.9*  CALCIUM 9.5 9.3  CREATININE 14.48* 15.25*  K 4.2 4.1   Inpatient medications:  amLODipine  10 mg Oral Daily   apixaban  5 mg Oral BID   calcium acetate  667 mg Oral TID with meals   carvedilol  25 mg Oral BID WC   gabapentin  300 mg Oral Daily   loratadine  10 mg Oral Daily   multivitamin  1 tablet Oral QHS    acetaminophen

## 2022-03-19 NOTE — Progress Notes (Signed)
PROGRESS NOTE    [name]  DXA:[id number] DOB: 12/09/1965 DOA: 03/18/2022  PCP: Bartholome Bill, MD   Brief Narrative:  This 56 years old male with PMH significant for ESRD on hemodialysis, paroxysmal A-fib, type 2 diabetes, hypertension, OSA no longer on CPAP, chronic diastolic heart failure, GERD presented in the ED with complaints of intermittent cough for the last 6 months and progressive shortness of breath over the last 4 days.  Patient also reports having associated chest pain, denies any fever, chills, nausea, vomiting, sick contact or recent URI.  He describes cough is chronic non productive that has been evaluated by PCP in the past.  Patient was hypoxic on arrival Spo2 86% on room air which improved to 90% with 2 L supplemental oxygen.  CTA chest ruled out PE but shows extensive bilateral pulmonary infiltrate could be aspiration.  Patient was admitted for further evaluation.  Assessment & Plan:   Principal Problem:   Fluid overload  Acute hypoxic respiratory failure: Community-acquired pneumonia,  atypical: Patient presented with chronic nonproductive cough associated with shortness of breath.   He presented with leukocytosis and pulmonary infiltrate on CT chest. Continue empiric antibiotics (ceftriaxone and Zithromax ).   De-escalate antibiotics as able. Continue pulmonary toilet, Follow-up urinary antigen , Legionella and Strep Pneumococci, Follow-up blood cultures. Follow-up respiratory panel. Continue supplemental oxygen and wean as tolerated.  Fluid overload: In the setting of ESRD on hemodialysis MWF. Chronic diastolic CHF with possible exacerbation: Patient has missed hemodialysis.  Nephrology is consulted , renal ultrasound was obtained.   Patient is going to have hemodialysis today.. Continue Coreg 25 mg bid.  Uncontrolled hypertension: In the setting of fluid overload.  Continue carvedilol, amlodipine. Continue hydralazine as needed.  Paroxysmal  A-fib: Currently in sinus rhythm. Continue Eliquis and carvedilol.  Diabetes mellitus type 2: Last hemoglobin A1c 7.6. Resume NPH  renal diet.  OSA: No longer on CPAP.  GERD:  Continue pantoprazole.    DVT prophylaxis: Eliquis Code Status: Full Code Family Communication: No family at bed side Disposition Plan:   Status is: Inpatient Remains inpatient appropriate because: Patient admitted for chronic nonproductive cough associated progressive shortness of breath.  Patient has missed hemodialysis,  Nephrology is consulted.   Consultants:  Nephrology  Procedures: None  Antimicrobials:  Anti-infectives (From admission, onward)    None      Subjective: Patient was seen and examined at bedside.  Overnight events noted. Patient reports doing much better.  Patient is lying comfortably with 2 L of supplemental oxygen.   Objective: Vitals:   03/19/22 0800 03/19/22 0954 03/19/22 1015 03/19/22 1045  BP: (!) 199/96 (!) 184/99 (!) 124/104 134/80  Pulse: 96  93 72  Resp: 17  (!) 23 17  Temp: 98 F (36.7 C)     TempSrc:      SpO2: 94%  95% 95%  Weight:      Height:       No intake or output data in the 24 hours ending 03/19/22 1053 Filed Weights   03/19/22 0453  Weight: 105.2 kg    Examination:  General exam: Appears comfortable, not in any acute distress.  Deconditioned Respiratory system: CTA bilaterally, respiratory effort normal, no accessory muscle use, RR 15 Cardiovascular system: S1 & S2 heard regular rate and rhythm, no murmur. Gastrointestinal system: Abdomen is soft, non tender, non distended, BS+ Central nervous system: Alert and oriented x 3. No focal neurological deficits. Extremities: No edema, No cyanosis, No clubbing Skin: No rashes, lesions  or ulcers Psychiatry: Judgement and insight appear normal. Mood & affect appropriate.     Data Reviewed: I have personally reviewed following labs and imaging studies  CBC: Recent Labs  Lab  03/18/22 1816 03/19/22 0305  WBC 11.5* 11.5*  NEUTROABS 9.1* 8.6*  HGB 11.0* 10.3*  HCT 33.2* 31.1*  MCV 94.3 95.1  PLT 403* 098   Basic Metabolic Panel: Recent Labs  Lab 03/18/22 1816 03/19/22 0305  NA 135 133*  K 4.2 4.1  CL 99 95*  CO2 22 21*  GLUCOSE 157* 180*  BUN 75* 79*  CREATININE 14.48* 15.25*  CALCIUM 9.5 9.3   GFR: Estimated Creatinine Clearance: 6.3 mL/min (A) (by C-G formula based on SCr of 15.25 mg/dL (H)). Liver Function Tests: Recent Labs  Lab 03/19/22 0305  AST 12*  ALT 8  ALKPHOS 77  BILITOT 0.6  PROT 6.5  ALBUMIN 2.9*   No results for input(s): "LIPASE", "AMYLASE" in the last 168 hours. No results for input(s): "AMMONIA" in the last 168 hours. Coagulation Profile: No results for input(s): "INR", "PROTIME" in the last 168 hours. Cardiac Enzymes: No results for input(s): "CKTOTAL", "CKMB", "CKMBINDEX", "TROPONINI" in the last 168 hours. BNP (last 3 results) No results for input(s): "PROBNP" in the last 8760 hours. HbA1C: No results for input(s): "HGBA1C" in the last 72 hours. CBG: No results for input(s): "GLUCAP" in the last 168 hours. Lipid Profile: No results for input(s): "CHOL", "HDL", "LDLCALC", "TRIG", "CHOLHDL", "LDLDIRECT" in the last 72 hours. Thyroid Function Tests: No results for input(s): "TSH", "T4TOTAL", "FREET4", "T3FREE", "THYROIDAB" in the last 72 hours. Anemia Panel: Recent Labs    03/19/22 0305  FERRITIN 600*   Sepsis Labs: Recent Labs  Lab 03/18/22 2302 03/19/22 0049 03/19/22 0305  PROCALCITON  --   --  0.38  LATICACIDVEN 1.2 1.3  --     Recent Results (from the past 240 hour(s))  Resp Panel by RT-PCR (Flu A&B, Covid) Anterior Nasal Swab     Status: None   Collection Time: 03/18/22 10:51 PM   Specimen: Anterior Nasal Swab  Result Value Ref Range Status   SARS Coronavirus 2 by RT PCR NEGATIVE NEGATIVE Final    Comment: (NOTE) SARS-CoV-2 target nucleic acids are NOT DETECTED.  The SARS-CoV-2 RNA is  generally detectable in upper respiratory specimens during the acute phase of infection. The lowest concentration of SARS-CoV-2 viral copies this assay can detect is 138 copies/mL. A negative result does not preclude SARS-Cov-2 infection and should not be used as the sole basis for treatment or other patient management decisions. A negative result may occur with  improper specimen collection/handling, submission of specimen other than nasopharyngeal swab, presence of viral mutation(s) within the areas targeted by this assay, and inadequate number of viral copies(<138 copies/mL). A negative result must be combined with clinical observations, patient history, and epidemiological information. The expected result is Negative.  Fact Sheet for Patients:  EntrepreneurPulse.com.au  Fact Sheet for Healthcare Providers:  IncredibleEmployment.be  This test is no t yet approved or cleared by the [place] FDA and  has been authorized for detection and/or diagnosis of SARS-CoV-2 by FDA under an Emergency Use Authorization (EUA). This EUA will remain  in effect (meaning this test can be used) for the duration of the COVID-19 declaration under Section 564(b)(1) of the Act, 21 U.S.C.section 360bbb-3(b)(1), unless the authorization is terminated  or revoked sooner.       Influenza A by PCR NEGATIVE NEGATIVE Final   Influenza B by  PCR NEGATIVE NEGATIVE Final    Comment: (NOTE) The Xpert Xpress SARS-CoV-2/FLU/RSV plus assay is intended as an aid in the diagnosis of influenza from Nasopharyngeal swab specimens and should not be used as a sole basis for treatment. Nasal washings and aspirates are unacceptable for Xpert Xpress SARS-CoV-2/FLU/RSV testing.  Fact Sheet for Patients: EntrepreneurPulse.com.au  Fact Sheet for Healthcare Providers: IncredibleEmployment.be  This test is not yet approved or cleared by the [place] FDA and has been authorized for detection and/or diagnosis of SARS-CoV-2 by FDA under an Emergency Use Authorization (EUA). This EUA will remain in effect (meaning this test can be used) for the duration of the COVID-19 declaration under Section 564(b)(1) of the Act, 21 U.S.C. section 360bbb-3(b)(1), unless the authorization is terminated or revoked.  Performed at [facility], [place] [address] [place], [place] [address]   Respiratory (~20 pathogens) panel by PCR     Status: None   Collection Time: 03/18/22 10:51 PM   Specimen: Peripheral; Respiratory  Result Value Ref Range Status   Adenovirus NOT DETECTED NOT DETECTED Final   Coronavirus 229E NOT DETECTED NOT DETECTED Final    Comment: (NOTE) The Coronavirus on the Respiratory Panel, DOES NOT test for the novel  Coronavirus (2019 nCoV)    Coronavirus HKU1 NOT DETECTED NOT DETECTED Final   Coronavirus NL63 NOT DETECTED NOT DETECTED Final   Coronavirus OC43 NOT DETECTED NOT DETECTED Final   Metapneumovirus NOT DETECTED NOT DETECTED Final   Rhinovirus / Enterovirus NOT DETECTED NOT DETECTED Final   Influenza A NOT DETECTED NOT DETECTED Final   Influenza B NOT DETECTED NOT DETECTED Final   Parainfluenza Virus 1 NOT DETECTED NOT DETECTED Final   Parainfluenza Virus 2 NOT DETECTED NOT DETECTED Final   Parainfluenza Virus 3 NOT DETECTED NOT DETECTED Final   Parainfluenza Virus 4 NOT DETECTED NOT DETECTED Final   Respiratory Syncytial Virus NOT DETECTED NOT DETECTED Final   Bordetella pertussis NOT DETECTED NOT DETECTED Final   Bordetella Parapertussis NOT DETECTED NOT DETECTED Final   Chlamydophila pneumoniae NOT DETECTED NOT DETECTED Final   Mycoplasma pneumoniae NOT DETECTED NOT DETECTED Final    Comment: Performed at [facility], [place]. [address] [place], [place] [address]  Blood culture (routine x 2)     Status: None (Preliminary result)   Collection Time: 03/18/22 11:02 PM   Specimen: BLOOD RIGHT ARM  Result  Value Ref Range Status   Specimen Description BLOOD RIGHT ARM  Final   Special Requests   Final    BOTTLES DRAWN AEROBIC AND ANAEROBIC Blood Culture adequate volume   Culture   Final    NO GROWTH < 12 HOURS Performed at [facility], [place] [address] [place], [place] [address]    Report Status PENDING  Incomplete  Blood culture (routine x 2)     Status: None (Preliminary result)   Collection Time: 03/19/22  3:05 AM   Specimen: BLOOD  Result Value Ref Range Status   Specimen Description BLOOD RIGHT ANTECUBITAL  Final   Special Requests   Final    BOTTLES DRAWN AEROBIC AND ANAEROBIC Blood Culture results may not be optimal due to an excessive volume of blood received in culture bottles   Culture   Final    NO GROWTH < 12 HOURS Performed at [facility], [place] [address], [place], [place] [address]    Report Status PENDING  Incomplete    Radiology Studies: CT Angio Chest PE W  and/or Wo Contrast  Result Date: 03/19/2022 CLINICAL DATA:  Pulmonary embolism (PE) suspected, high prob. Cough, dyspnea. EXAM: CT ANGIOGRAPHY CHEST WITH CONTRAST TECHNIQUE: Multidetector CT imaging of the chest was performed using the standard protocol during bolus administration of intravenous contrast. Multiplanar CT image reconstructions and MIPs were obtained to evaluate the vascular anatomy. RADIATION DOSE REDUCTION: This exam was performed according to the departmental dose-optimization program which includes automated exposure control, adjustment of the mA and/or kV according to patient size and/or use of iterative reconstruction technique. CONTRAST:  64m OMNIPAQUE IOHEXOL 350 MG/ML SOLN COMPARISON:  None Available. FINDINGS: Cardiovascular: Adequate opacification of the pulmonary arterial tree. No intraluminal filling defect identified through the segmental level to suggest acute pulmonary embolism. Central pulmonary arteries are of normal caliber. Moderate coronary artery calcification. Mild  cardiomegaly with left ventricular hypertrophy. No pericardial effusion. Minimal atherosclerotic calcification within the thoracic aorta. No aortic aneurysm. Mediastinum/Nodes: There are multiple mildly enlarged mediastinal and bilateral hilar lymph nodes, possibly reactive in nature, measuring up to 15 mm within the right paratracheal region. The esophagus is unremarkable. Visualized thyroid is unremarkable. Lungs/Pleura: There are extensive bilateral centrilobular nodular infiltrates in keeping with acute infection or, less likely given their distribution, aspiration in the acute setting. 8 mm nodule within the right minor fissure may represent intrapulmonary lymph node. There is mild superimposed interstitial pulmonary edema and trace bilateral pleural effusions are present. Mild bibasilar atelectasis. No pneumothorax. No central obstructing lesion. Upper Abdomen: No acute abnormality. Musculoskeletal: No chest wall abnormality. No acute or significant osseous findings. Review of the MIP images confirms the above findings. IMPRESSION: 1. No pulmonary embolism. 2. Extensive bilateral centrilobular nodular infiltrates in keeping with acute infection or, less likely given their distribution, aspiration in the acute setting. 3. Mild superimposed interstitial pulmonary edema and trace bilateral pleural effusions. 4. Mild cardiomegaly with left ventricular hypertrophy. Moderate coronary artery calcification. 5. Mild mediastinal and bilateral hilar lymphadenopathy, possibly reactive in nature. 6. 8 mm nodule within the right minor fissure, possibly representing intrapulmonary lymph node. Aortic Atherosclerosis (ICD10-I70.0). Electronically Signed   By: AFidela SalisburyM.D.   On: 03/19/2022 00:56   DG Chest 2 View  Result Date: 03/18/2022 CLINICAL DATA:  Shortness of breath. EXAM: CHEST - 2 VIEW COMPARISON:  12/20/2021 FINDINGS: Previous dialysis catheter is been removed. The heart is mildly enlarged. Small pleural  effusions with fluid in the fissures. There is pulmonary edema with septal thickening peripherally. No confluent consolidation. No pneumothorax. No acute osseous findings. IMPRESSION: Findings consistent with CHF. Cardiomegaly with pulmonary edema and small pleural effusions. Electronically Signed   By: MKeith RakeM.D.   On: 03/18/2022 18:38    Scheduled Meds:  amLODipine  10 mg Oral Daily   apixaban  5 mg Oral BID   calcium acetate  667 mg Oral TID with meals   carvedilol  25 mg Oral BID WC   Chlorhexidine Gluconate Cloth  6 each Topical Q0600   gabapentin  300 mg Oral Daily   loratadine  10 mg Oral Daily   multivitamin  1 tablet Oral QHS   Continuous Infusions:   LOS: 1 day    Time spent: 50 mins    Travarus Trudo, MD [facility]   If 7PM-7AM, please contact night-coverage

## 2022-03-19 NOTE — Procedures (Signed)
   I was present at this dialysis session, have reviewed the session itself and made  appropriate changes Kelly Splinter MD [place] pager [phone]   03/19/2022, 3:11 PM

## 2022-03-19 NOTE — Progress Notes (Signed)
   03/19/22 1657  Vitals  Temp 98.5 F (36.9 C)  Temp Source Oral  BP (!) 145/89  MAP (mmHg) 106  BP Location Right Wrist  BP Method Automatic  Patient Position (if appropriate) Lying  Pulse Rate 73  Pulse Rate Source Monitor  ECG Heart Rate 75  Resp 16  Oxygen Therapy  SpO2 95 %  O2 Device Nasal Cannula  O2 Flow Rate (L/min) 2 L/min  Pulse Oximetry Type Continuous   Received patient in bed to unit.  Alert and oriented.  Informed consent signed and in chart.   Treatment initiated: 1357 Treatment completed: 1626  Patient tolerated well.  Transported back to the room  Alert, without acute distress.  Hand-off given to patient's nurse.   Access used: AVF Access issues:  infiltrated d/t pt's constant arm movement and then rolling over on his side and laying almost his full body wt on access arm as he is running on tx despite being educated and redirected on the importance of not doing that  Total UF removed: 2199 ml Medication(s) given: Heparin 4000 units bolus Post HD VS: see above Post HD weight: UTA, pt on stretcher, stated felt too weak to stand to weigh   [name]

## 2022-03-20 LAB — LEGIONELLA PNEUMOPHILA SEROGP 1 UR AG: L. pneumophila Serogp 1 Ur Ag: NEGATIVE

## 2022-03-20 LAB — GLUCOSE, CAPILLARY: Glucose-Capillary: 161 mg/dL — ABNORMAL HIGH (ref 70–99)

## 2022-03-20 LAB — HEPATITIS B SURFACE ANTIBODY, QUANTITATIVE: Hep B S AB Quant (Post): >1000 m[IU]/mL (ref >9.9)

## 2022-03-20 MED ORDER — CHLORHEXIDINE GLUCONATE CLOTH 2 % EX PADS
6.0000 | MEDICATED_PAD | Freq: Every day | CUTANEOUS | Status: DC
  Administered 2022-03-20: 6 via TOPICAL

## 2022-03-20 MED ORDER — DOXERCALCIFEROL 4 MCG/2ML IV SOLN
5.0000 ug | INTRAVENOUS | Status: DC
  Administered 2022-03-21: 5 ug via INTRAVENOUS
  Filled 2022-03-20: qty 4

## 2022-03-20 MED ORDER — GUAIFENESIN-DM 100-10 MG/5ML PO SYRP
15.0000 mL | ORAL_SOLUTION | Freq: Four times a day (QID) | ORAL | Status: DC | PRN
  Administered 2022-03-20: 15 mL via ORAL
  Filled 2022-03-20: qty 15

## 2022-03-20 NOTE — Progress Notes (Signed)
Pt receives out-pt HD at [facility] on MWF. Will assist as needed.   Donna Silverman Renal Navigator [phone] 

## 2022-03-20 NOTE — Progress Notes (Signed)
PROGRESS NOTE    [name]  ZOX:[id number] DOB: 12-31-1965 DOA: 03/18/2022  PCP: Bartholome Bill, MD   Brief Narrative:  This 56 years old male with PMH significant for ESRD on hemodialysis, paroxysmal A-fib, type 2 diabetes, hypertension, OSA no longer on CPAP, chronic diastolic heart failure, GERD presented in the ED with complaints of intermittent cough for the last 6 months and progressive shortness of breath for last 4 days.  Patient also reports having associated chest pain, denies any fever, chills, nausea, vomiting, sick contact or recent URI.  He describes cough is chronic,  non productive that has been evaluated by PCP in the past.  Patient was hypoxic on arrival Spo2 86% on room air which improved to 90% with 2 L supplemental oxygen.  CTA chest ruled out PE but shows extensive bilateral pulmonary infiltrate could be aspiration or atypical.  Patient was admitted for further evaluation.  Assessment & Plan:   Principal Problem:   Fluid overload Active Problems:   Acute renal failure superimposed on chronic kidney disease, on chronic dialysis ([place])   Acute pulmonary edema (HCC)  Acute hypoxic respiratory failure: Community-acquired pneumonia,  atypical: Patient presented with chronic nonproductive cough associated with shortness of breath.   He presented with leukocytosis and pulmonary infiltrate on CT chest. Continue empiric antibiotics (Ceftriaxone and Zithromax ).   De-escalate antibiotics as able. Continue pulmonary toilet, Urinary strep pneumo antigen negative, Legionella pending Follow-up blood cultures.  Respiratory panel negative. Continue supplemental oxygen and wean as tolerated.  Fluid overload: ESRD on hemodialysis: In the setting of ESRD on hemodialysis MWF. Chronic diastolic CHF with possible exacerbation: Patient has missed hemodialysis.  Nephrology is consulted , renal ultrasound was obtained.   Chest x-ray showed findings consistent with pulmonary  edema, CHF Patient underwent hemodialysis yesterday.  Next hemodialysis 12/13. Continue Coreg 25 mg bid.  Uncontrolled hypertension: In the setting of fluid overload.  Continue carvedilol, amlodipine. Continue hydralazine as needed. BP is now controlled.  Paroxysmal A-fib: Currently in sinus rhythm. HR well controlled. Continue Eliquis and carvedilol.  Diabetes mellitus type 2: Last hemoglobin A1c 7.6. Resume NPH  renal diet.  OSA: No longer on CPAP.  GERD:  Continue pantoprazole.  Anemia of chronic disease: H/H remains stable.   There is no signs of any obvious bleeding.  DVT prophylaxis: Eliquis Code Status: Full Code Family Communication: No family at bed side. Disposition Plan:   Status is: Inpatient Remains inpatient appropriate because: Patient admitted for chronic nonproductive cough associated progressive shortness of breath.  Patient has missed hemodialysis,  Nephrology is consulted.  Underwent hemodialysis getting IV antibiotics for atypical pneumonia.   Consultants:  Nephrology  Procedures: None  Antimicrobials:  Anti-infectives (From admission, onward)    None      Subjective: Patient was seen and examined at bedside.  Overnight events noted. Patient reports doing much better, patient was lying comfortably with 2 L of supplemental oxygen. He still reports having cough, but shortness of breath is improving.   Objective: Vitals:   03/19/22 1949 03/20/22 0014 03/20/22 0737 03/20/22 1110  BP:  (!) 141/81 (!) 159/99 (!) 109/96  Pulse:  69 72 71  Resp:  '20 20 18  '$ Temp: 98.5 F (36.9 C) (!) 97.5 F (36.4 C) 97.7 F (36.5 C) 97.7 F (36.5 C)  TempSrc:  Oral Oral Oral  SpO2:  97% 99% 98%  Weight:      Height:        Intake/Output Summary (Last 24 hours) at 03/20/2022  1120 Last data filed at 03/20/2022 0739 Gross per 24 hour  Intake 240 ml  Output 2399 ml  Net -2159 ml   Filed Weights   03/19/22 0453  Weight: 105.2 kg     Examination:  General exam: Appears comfortable, not in any acute distress, deconditioned. Respiratory system: Decreased breath sounds, respiratory effort normal, no accessory muscle use, RR 15 Cardiovascular system: S1-S2 heard, regular rate and rhythm, no murmur. Gastrointestinal system: Abdomen is soft, non tender, non distended, BS+ Central nervous system: Alert and oriented x 3.  No focal neurological deficits. Extremities: No edema, No cyanosis, No clubbing Skin: No rashes, lesions or ulcers Psychiatry: Judgement and insight appear normal. Mood & affect appropriate.     Data Reviewed: I have personally reviewed following labs and imaging studies  CBC: Recent Labs  Lab 03/18/22 1816 03/19/22 0305  WBC 11.5* 11.5*  NEUTROABS 9.1* 8.6*  HGB 11.0* 10.3*  HCT 33.2* 31.1*  MCV 94.3 95.1  PLT 403* 993   Basic Metabolic Panel: Recent Labs  Lab 03/18/22 1816 03/19/22 0305 03/19/22 1343  NA 135 133*  --   K 4.2 4.1  --   CL 99 95*  --   CO2 22 21*  --   GLUCOSE 157* 180*  --   BUN 75* 79*  --   CREATININE 14.48* 15.25*  --   CALCIUM 9.5 9.3  --   PHOS  --   --  7.3*   GFR: Estimated Creatinine Clearance: 6.3 mL/min (A) (by C-G formula based on SCr of 15.25 mg/dL (H)). Liver Function Tests: Recent Labs  Lab 03/19/22 0305  AST 12*  ALT 8  ALKPHOS 77  BILITOT 0.6  PROT 6.5  ALBUMIN 2.9*   No results for input(s): "LIPASE", "AMYLASE" in the last 168 hours. No results for input(s): "AMMONIA" in the last 168 hours. Coagulation Profile: No results for input(s): "INR", "PROTIME" in the last 168 hours. Cardiac Enzymes: No results for input(s): "CKTOTAL", "CKMB", "CKMBINDEX", "TROPONINI" in the last 168 hours. BNP (last 3 results) No results for input(s): "PROBNP" in the last 8760 hours. HbA1C: No results for input(s): "HGBA1C" in the last 72 hours. CBG: Recent Labs  Lab 03/20/22 1112  GLUCAP 161*   Lipid Profile: No results for input(s): "CHOL",  "HDL", "LDLCALC", "TRIG", "CHOLHDL", "LDLDIRECT" in the last 72 hours. Thyroid Function Tests: No results for input(s): "TSH", "T4TOTAL", "FREET4", "T3FREE", "THYROIDAB" in the last 72 hours. Anemia Panel: Recent Labs    03/19/22 0305  FERRITIN 600*   Sepsis Labs: Recent Labs  Lab 03/18/22 2302 03/19/22 0049 03/19/22 0305  PROCALCITON  --   --  0.38  LATICACIDVEN 1.2 1.3  --     Recent Results (from the past 240 hour(s))  Resp Panel by RT-PCR (Flu A&B, Covid) Anterior Nasal Swab     Status: None   Collection Time: 03/18/22 10:51 PM   Specimen: Anterior Nasal Swab  Result Value Ref Range Status   SARS Coronavirus 2 by RT PCR NEGATIVE NEGATIVE Final    Comment: (NOTE) SARS-CoV-2 target nucleic acids are NOT DETECTED.  The SARS-CoV-2 RNA is generally detectable in upper respiratory specimens during the acute phase of infection. The lowest concentration of SARS-CoV-2 viral copies this assay can detect is 138 copies/mL. A negative result does not preclude SARS-Cov-2 infection and should not be used as the sole basis for treatment or other patient management decisions. A negative result may occur with  improper specimen collection/handling, submission of specimen other than  nasopharyngeal swab, presence of viral mutation(s) within the areas targeted by this assay, and inadequate number of viral copies(<138 copies/mL). A negative result must be combined with clinical observations, patient history, and epidemiological information. The expected result is Negative.  Fact Sheet for Patients:  EntrepreneurPulse.com.au  Fact Sheet for Healthcare Providers:  IncredibleEmployment.be  This test is no t yet approved or cleared by the [place] FDA and  has been authorized for detection and/or diagnosis of SARS-CoV-2 by FDA under an Emergency Use Authorization (EUA). This EUA will remain  in effect (meaning this test can be used) for the duration  of the COVID-19 declaration under Section 564(b)(1) of the Act, 21 U.S.C.section 360bbb-3(b)(1), unless the authorization is terminated  or revoked sooner.       Influenza A by PCR NEGATIVE NEGATIVE Final   Influenza B by PCR NEGATIVE NEGATIVE Final    Comment: (NOTE) The Xpert Xpress SARS-CoV-2/FLU/RSV plus assay is intended as an aid in the diagnosis of influenza from Nasopharyngeal swab specimens and should not be used as a sole basis for treatment. Nasal washings and aspirates are unacceptable for Xpert Xpress SARS-CoV-2/FLU/RSV testing.  Fact Sheet for Patients: EntrepreneurPulse.com.au  Fact Sheet for Healthcare Providers: IncredibleEmployment.be  This test is not yet approved or cleared by the [place] FDA and has been authorized for detection and/or diagnosis of SARS-CoV-2 by FDA under an Emergency Use Authorization (EUA). This EUA will remain in effect (meaning this test can be used) for the duration of the COVID-19 declaration under Section 564(b)(1) of the Act, 21 U.S.C. section 360bbb-3(b)(1), unless the authorization is terminated or revoked.  Performed at [facility], [place] [address], [place], [place] [address]   Respiratory (~20 pathogens) panel by PCR     Status: None   Collection Time: 03/18/22 10:51 PM   Specimen: Peripheral; Respiratory  Result Value Ref Range Status   Adenovirus NOT DETECTED NOT DETECTED Final   Coronavirus 229E NOT DETECTED NOT DETECTED Final    Comment: (NOTE) The Coronavirus on the Respiratory Panel, DOES NOT test for the novel  Coronavirus (2019 nCoV)    Coronavirus HKU1 NOT DETECTED NOT DETECTED Final   Coronavirus NL63 NOT DETECTED NOT DETECTED Final   Coronavirus OC43 NOT DETECTED NOT DETECTED Final   Metapneumovirus NOT DETECTED NOT DETECTED Final   Rhinovirus / Enterovirus NOT DETECTED NOT DETECTED Final   Influenza A NOT DETECTED NOT DETECTED Final   Influenza B NOT DETECTED  NOT DETECTED Final   Parainfluenza Virus 1 NOT DETECTED NOT DETECTED Final   Parainfluenza Virus 2 NOT DETECTED NOT DETECTED Final   Parainfluenza Virus 3 NOT DETECTED NOT DETECTED Final   Parainfluenza Virus 4 NOT DETECTED NOT DETECTED Final   Respiratory Syncytial Virus NOT DETECTED NOT DETECTED Final   Bordetella pertussis NOT DETECTED NOT DETECTED Final   Bordetella Parapertussis NOT DETECTED NOT DETECTED Final   Chlamydophila pneumoniae NOT DETECTED NOT DETECTED Final   Mycoplasma pneumoniae NOT DETECTED NOT DETECTED Final    Comment: Performed at [facility], [place]. [address], [place], [place] [address]  Blood culture (routine x 2)     Status: None (Preliminary result)   Collection Time: 03/18/22 11:02 PM   Specimen: BLOOD RIGHT ARM  Result Value Ref Range Status   Specimen Description BLOOD RIGHT ARM  Final   Special Requests   Final    BOTTLES DRAWN AEROBIC AND ANAEROBIC Blood Culture adequate volume   Culture   Final    NO  GROWTH < 12 HOURS Performed at [facility] [address], [place], [place] [address]    Report Status PENDING  Incomplete  Blood culture (routine x 2)     Status: None (Preliminary result)   Collection Time: 03/19/22  3:05 AM   Specimen: BLOOD  Result Value Ref Range Status   Specimen Description BLOOD RIGHT ANTECUBITAL  Final   Special Requests   Final    BOTTLES DRAWN AEROBIC AND ANAEROBIC Blood Culture results may not be optimal due to an excessive volume of blood received in culture bottles   Culture   Final    NO GROWTH < 12 HOURS Performed at [facility], [place] [address], [place], [place] [address]    Report Status PENDING  Incomplete    Radiology Studies: CT Angio Chest PE W and/or Wo Contrast  Result Date: 03/19/2022 CLINICAL DATA:  Pulmonary embolism (PE) suspected, high prob. Cough, dyspnea. EXAM: CT ANGIOGRAPHY CHEST WITH CONTRAST TECHNIQUE: Multidetector CT imaging of the chest was performed using the standard  protocol during bolus administration of intravenous contrast. Multiplanar CT image reconstructions and MIPs were obtained to evaluate the vascular anatomy. RADIATION DOSE REDUCTION: This exam was performed according to the departmental dose-optimization program which includes automated exposure control, adjustment of the mA and/or kV according to patient size and/or use of iterative reconstruction technique. CONTRAST:  71m OMNIPAQUE IOHEXOL 350 MG/ML SOLN COMPARISON:  None Available. FINDINGS: Cardiovascular: Adequate opacification of the pulmonary arterial tree. No intraluminal filling defect identified through the segmental level to suggest acute pulmonary embolism. Central pulmonary arteries are of normal caliber. Moderate coronary artery calcification. Mild cardiomegaly with left ventricular hypertrophy. No pericardial effusion. Minimal atherosclerotic calcification within the thoracic aorta. No aortic aneurysm. Mediastinum/Nodes: There are multiple mildly enlarged mediastinal and bilateral hilar lymph nodes, possibly reactive in nature, measuring up to 15 mm within the right paratracheal region. The esophagus is unremarkable. Visualized thyroid is unremarkable. Lungs/Pleura: There are extensive bilateral centrilobular nodular infiltrates in keeping with acute infection or, less likely given their distribution, aspiration in the acute setting. 8 mm nodule within the right minor fissure may represent intrapulmonary lymph node. There is mild superimposed interstitial pulmonary edema and trace bilateral pleural effusions are present. Mild bibasilar atelectasis. No pneumothorax. No central obstructing lesion. Upper Abdomen: No acute abnormality. Musculoskeletal: No chest wall abnormality. No acute or significant osseous findings. Review of the MIP images confirms the above findings. IMPRESSION: 1. No pulmonary embolism. 2. Extensive bilateral centrilobular nodular infiltrates in keeping with acute infection or,  less likely given their distribution, aspiration in the acute setting. 3. Mild superimposed interstitial pulmonary edema and trace bilateral pleural effusions. 4. Mild cardiomegaly with left ventricular hypertrophy. Moderate coronary artery calcification. 5. Mild mediastinal and bilateral hilar lymphadenopathy, possibly reactive in nature. 6. 8 mm nodule within the right minor fissure, possibly representing intrapulmonary lymph node. Aortic Atherosclerosis (ICD10-I70.0). Electronically Signed   By: AFidela SalisburyM.D.   On: 03/19/2022 00:56   DG Chest 2 View  Result Date: 03/18/2022 CLINICAL DATA:  Shortness of breath. EXAM: CHEST - 2 VIEW COMPARISON:  12/20/2021 FINDINGS: Previous dialysis catheter is been removed. The heart is mildly enlarged. Small pleural effusions with fluid in the fissures. There is pulmonary edema with septal thickening peripherally. No confluent consolidation. No pneumothorax. No acute osseous findings. IMPRESSION: Findings consistent with CHF. Cardiomegaly with pulmonary edema and small pleural effusions. Electronically Signed   By: MKeith RakeM.D.   On: 03/18/2022 18:38  Scheduled Meds:  amLODipine  10 mg Oral Daily   apixaban  5 mg Oral BID   calcium acetate  667 mg Oral TID with meals   carvedilol  25 mg Oral BID WC   Chlorhexidine Gluconate Cloth  6 each Topical Q0600   Chlorhexidine Gluconate Cloth  6 each Topical Q0600   [START ON 03/21/2022] cinacalcet  180 mg Oral Q M,W,F-HD   [START ON 03/21/2022] doxercalciferol  5 mcg Intravenous Q M,W,F-HD   gabapentin  300 mg Oral Daily   loratadine  10 mg Oral Daily   multivitamin  1 tablet Oral QHS   Continuous Infusions:   LOS: 2 days    Time spent: 35 mins    Amor Hyle, MD [facility]   If 7PM-7AM, please contact night-coverage

## 2022-03-20 NOTE — Progress Notes (Signed)
Order received to transfer patient to [phone].  Patient made aware and in agreement.  Report called to receiving RN.  4E telemetry box removed and placed at nurses' station.  Patient and his belongings taken in wheelchair to new room.

## 2022-03-20 NOTE — Progress Notes (Signed)
[facility] Progress Note   Subjective:   Seen in room, had 2.2L UF with HD yesterday and had mild infiltration of AVF. Reports breathing is better but not at baseline, still requiring O2. No CP, dizziness or headache  Objective Vitals:   03/19/22 1730 03/19/22 1949 03/20/22 0014 03/20/22 0737  BP: 134/88  (!) 141/81 (!) 159/99  Pulse: 73  69 72  Resp: '12  20 20  '$ Temp:  98.5 F (36.9 C) (!) 97.5 F (36.4 C) 97.7 F (36.5 C)  TempSrc:   Oral Oral  SpO2: 97%  97% 99%  Weight:      Height:       Physical Exam General: Alert male in NAD Heart: RRR, no murmurs, rubs or gallops Lungs: CTA bilaterally without wheezing, rhonchi or rales. On O2 via [place] Abdomen: Soft, non-distended, +BS Extremities: No edema b/l lower extremities Dialysis Access:  LUE AVF+ bruit, mild edema distally  Additional Objective Labs: Basic Metabolic Panel: Recent Labs  Lab 03/18/22 1816 03/19/22 0305 03/19/22 1343  NA 135 133*  --   K 4.2 4.1  --   CL 99 95*  --   CO2 22 21*  --   GLUCOSE 157* 180*  --   BUN 75* 79*  --   CREATININE 14.48* 15.25*  --   CALCIUM 9.5 9.3  --   PHOS  --   --  7.3*   Liver Function Tests: Recent Labs  Lab 03/19/22 0305  AST 12*  ALT 8  ALKPHOS 77  BILITOT 0.6  PROT 6.5  ALBUMIN 2.9*   No results for input(s): "LIPASE", "AMYLASE" in the last 168 hours. CBC: Recent Labs  Lab 03/18/22 1816 03/19/22 0305  WBC 11.5* 11.5*  NEUTROABS 9.1* 8.6*  HGB 11.0* 10.3*  HCT 33.2* 31.1*  MCV 94.3 95.1  PLT 403* 373   Blood Culture    Component Value Date/Time   SDES BLOOD RIGHT ANTECUBITAL 03/19/2022 0305   SPECREQUEST  03/19/2022 0305    BOTTLES DRAWN AEROBIC AND ANAEROBIC Blood Culture results may not be optimal due to an excessive volume of blood received in culture bottles   CULT  03/19/2022 0305    NO GROWTH < 12 HOURS Performed at [facility] [address], [place], [place] [address]    REPTSTATUS PENDING 03/19/2022 9735     Cardiac Enzymes: No results for input(s): "CKTOTAL", "CKMB", "CKMBINDEX", "TROPONINI" in the last 168 hours. CBG: No results for input(s): "GLUCAP" in the last 168 hours. Iron Studies:  Recent Labs    03/19/22 0305  FERRITIN 600*   '@lablastinr3'$ @ Studies/Results: CT Angio Chest PE W and/or Wo Contrast  Result Date: 03/19/2022 CLINICAL DATA:  Pulmonary embolism (PE) suspected, high prob. Cough, dyspnea. EXAM: CT ANGIOGRAPHY CHEST WITH CONTRAST TECHNIQUE: Multidetector CT imaging of the chest was performed using the standard protocol during bolus administration of intravenous contrast. Multiplanar CT image reconstructions and MIPs were obtained to evaluate the vascular anatomy. RADIATION DOSE REDUCTION: This exam was performed according to the departmental dose-optimization program which includes automated exposure control, adjustment of the mA and/or kV according to patient size and/or use of iterative reconstruction technique. CONTRAST:  38m OMNIPAQUE IOHEXOL 350 MG/ML SOLN COMPARISON:  None Available. FINDINGS: Cardiovascular: Adequate opacification of the pulmonary arterial tree. No intraluminal filling defect identified through the segmental level to suggest acute pulmonary embolism. Central pulmonary arteries are of normal caliber. Moderate coronary artery calcification. Mild cardiomegaly with left ventricular hypertrophy. No pericardial effusion. Minimal  atherosclerotic calcification within the thoracic aorta. No aortic aneurysm. Mediastinum/Nodes: There are multiple mildly enlarged mediastinal and bilateral hilar lymph nodes, possibly reactive in nature, measuring up to 15 mm within the right paratracheal region. The esophagus is unremarkable. Visualized thyroid is unremarkable. Lungs/Pleura: There are extensive bilateral centrilobular nodular infiltrates in keeping with acute infection or, less likely given their distribution, aspiration in the acute setting. 8 mm nodule within the right  minor fissure may represent intrapulmonary lymph node. There is mild superimposed interstitial pulmonary edema and trace bilateral pleural effusions are present. Mild bibasilar atelectasis. No pneumothorax. No central obstructing lesion. Upper Abdomen: No acute abnormality. Musculoskeletal: No chest wall abnormality. No acute or significant osseous findings. Review of the MIP images confirms the above findings. IMPRESSION: 1. No pulmonary embolism. 2. Extensive bilateral centrilobular nodular infiltrates in keeping with acute infection or, less likely given their distribution, aspiration in the acute setting. 3. Mild superimposed interstitial pulmonary edema and trace bilateral pleural effusions. 4. Mild cardiomegaly with left ventricular hypertrophy. Moderate coronary artery calcification. 5. Mild mediastinal and bilateral hilar lymphadenopathy, possibly reactive in nature. 6. 8 mm nodule within the right minor fissure, possibly representing intrapulmonary lymph node. Aortic Atherosclerosis (ICD10-I70.0). Electronically Signed   By: Fidela Salisbury M.D.   On: 03/19/2022 00:56   DG Chest 2 View  Result Date: 03/18/2022 CLINICAL DATA:  Shortness of breath. EXAM: CHEST - 2 VIEW COMPARISON:  12/20/2021 FINDINGS: Previous dialysis catheter is been removed. The heart is mildly enlarged. Small pleural effusions with fluid in the fissures. There is pulmonary edema with septal thickening peripherally. No confluent consolidation. No pneumothorax. No acute osseous findings. IMPRESSION: Findings consistent with CHF. Cardiomegaly with pulmonary edema and small pleural effusions. Electronically Signed   By: Keith Rake M.D.   On: 03/18/2022 18:38   Medications:   amLODipine  10 mg Oral Daily   apixaban  5 mg Oral BID   calcium acetate  667 mg Oral TID with meals   carvedilol  25 mg Oral BID WC   Chlorhexidine Gluconate Cloth  6 each Topical Q0600   [START ON 03/21/2022] cinacalcet  180 mg Oral Q M,W,F-HD    [START ON 03/21/2022] doxercalciferol  7 mcg Intravenous Q M,W,F-HD   gabapentin  300 mg Oral Daily   loratadine  10 mg Oral Daily   multivitamin  1 tablet Oral QHS    Dialysis Orders: SW MWF 4h 37mn   500/1.5   103.5kg  2/2 bath  Hep 4100   LUA AVF - high wt gains - last HD 12/6 , post wt 104.7 - hectorol 7 ug IV tiw - sensipar 180 po tiw - no esa, last Hb11.0  Assessment/Plan: AHRF - CXR + pulm edema and CTA showing multifocal PNA. IV abx per pmd. Had HD yesterday with UF 2.2L, breathing better but still not at baseline.  ESRD- on HD MWF. Missed HD last Friday. HD yesterday as above. High census, likely cannot fit in an extra dialysis treatment today but will plan for first shift tomorrow.   BP/ vol - BP's high, vol overloaded, max UF w/ HD  MBD ckd - CCa high, phos elevated. Decreased IV vdra and continued sensipar and phoslo as binder.  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 Follow DM2 - per pmd  SAnice Paganini PA-C 03/20/2022, 10:52 AM  [facility] Pager: [phone]

## 2022-03-21 DIAGNOSIS — E785 Hyperlipidemia, unspecified: Secondary | ICD-10-CM

## 2022-03-21 DIAGNOSIS — E669 Obesity, unspecified: Secondary | ICD-10-CM

## 2022-03-21 DIAGNOSIS — I480 Paroxysmal atrial fibrillation: Secondary | ICD-10-CM

## 2022-03-21 DIAGNOSIS — E1169 Type 2 diabetes mellitus with other specified complication: Secondary | ICD-10-CM

## 2022-03-21 LAB — BASIC METABOLIC PANEL
Anion gap: 16 — ABNORMAL HIGH (ref 5–15)
BUN: 76 mg/dL — ABNORMAL HIGH (ref 6–20)
CO2: 24 mmol/L (ref 22–32)
Calcium: 9.6 mg/dL (ref 8.9–10.3)
Chloride: 96 mmol/L — ABNORMAL LOW (ref 98–111)
Creatinine, Ser: 13.64 mg/dL — ABNORMAL HIGH (ref 0.61–1.24)
GFR, Estimated: 4 mL/min — ABNORMAL LOW (ref >60)
Glucose, Bld: 130 mg/dL — ABNORMAL HIGH (ref 70–99)
Potassium: 4.2 mmol/L (ref 3.5–5.1)
Sodium: 136 mmol/L (ref 135–145)

## 2022-03-21 LAB — PHOSPHORUS: Phosphorus: 6.9 mg/dL — ABNORMAL HIGH (ref 2.5–4.6)

## 2022-03-21 LAB — CBC
HCT: 27.5 % — ABNORMAL LOW (ref 39.0–52.0)
HCT: 28.3 % — ABNORMAL LOW (ref 39.0–52.0)
Hemoglobin: 9.5 g/dL — ABNORMAL LOW (ref 13.0–17.0)
Hemoglobin: 9.7 g/dL — ABNORMAL LOW (ref 13.0–17.0)
MCH: 32.3 pg (ref 26.0–34.0)
MCH: 32.1 pg (ref 26.0–34.0)
MCHC: 34.5 g/dL (ref 30.0–36.0)
MCHC: 34.3 g/dL (ref 30.0–36.0)
MCV: 93.5 fL (ref 80.0–100.0)
MCV: 93.7 fL (ref 80.0–100.0)
Platelets: 362 10*3/uL (ref 150–400)
Platelets: 364 10*3/uL (ref 150–400)
RBC: 2.94 MIL/uL — ABNORMAL LOW (ref 4.22–5.81)
RBC: 3.02 MIL/uL — ABNORMAL LOW (ref 4.22–5.81)
RDW: 13.2 % (ref 11.5–15.5)
RDW: 13.2 % (ref 11.5–15.5)
WBC: 8.4 10*3/uL (ref 4.0–10.5)
WBC: 8.3 10*3/uL (ref 4.0–10.5)
nRBC: 0 % (ref 0.0–0.2)
nRBC: 0 % (ref 0.0–0.2)

## 2022-03-21 LAB — RENAL FUNCTION PANEL
Albumin: 2.8 g/dL — ABNORMAL LOW (ref 3.5–5.0)
Anion gap: 13 (ref 5–15)
BUN: 75 mg/dL — ABNORMAL HIGH (ref 6–20)
CO2: 24 mmol/L (ref 22–32)
Calcium: 9.5 mg/dL (ref 8.9–10.3)
Chloride: 98 mmol/L (ref 98–111)
Creatinine, Ser: 14.89 mg/dL — ABNORMAL HIGH (ref 0.61–1.24)
GFR, Estimated: 3 mL/min — ABNORMAL LOW (ref >60)
Glucose, Bld: 129 mg/dL — ABNORMAL HIGH (ref 70–99)
Phosphorus: 6.9 mg/dL — ABNORMAL HIGH (ref 2.5–4.6)
Potassium: 4.3 mmol/L (ref 3.5–5.1)
Sodium: 135 mmol/L (ref 135–145)

## 2022-03-21 LAB — BORDETELLA PERTUSSIS PCR
B parapertussis, DNA: NEGATIVE
B pertussis, DNA: NEGATIVE

## 2022-03-21 LAB — MAGNESIUM: Magnesium: 2.8 mg/dL — ABNORMAL HIGH (ref 1.7–2.4)

## 2022-03-21 MED ORDER — LIDOCAINE HCL (PF) 1 % IJ SOLN: 5.0000 mL | INTRAMUSCULAR | Status: DC | PRN

## 2022-03-21 MED ORDER — HEPARIN SODIUM (PORCINE) 1000 UNIT/ML DIALYSIS
2000.0000 [IU] | Freq: Once | INTRAMUSCULAR | Status: DC
  Administered 2022-03-21: 2000 [IU] via INTRAVENOUS_CENTRAL

## 2022-03-21 MED ORDER — ONDANSETRON HCL 4 MG/2ML IJ SOLN
4.0000 mg | Freq: Four times a day (QID) | INTRAMUSCULAR | Status: DC | PRN
  Filled 2022-03-21: qty 2

## 2022-03-21 MED ORDER — HEPARIN SODIUM (PORCINE) 1000 UNIT/ML IJ SOLN
INTRAMUSCULAR | Status: AC
  Filled 2022-03-21: qty 2

## 2022-03-21 MED ORDER — ALTEPLASE 2 MG IJ SOLR: 2.0000 mg | Freq: Once | INTRAMUSCULAR | Status: DC | PRN

## 2022-03-21 MED ORDER — LIDOCAINE-PRILOCAINE 2.5-2.5 % EX CREA: 1.0000 | TOPICAL_CREAM | CUTANEOUS | Status: DC | PRN

## 2022-03-21 MED ORDER — ANTICOAGULANT SODIUM CITRATE 4% (200MG/5ML) IV SOLN: 5.0000 mL | Status: DC | PRN

## 2022-03-21 MED ORDER — PENTAFLUOROPROP-TETRAFLUOROETH EX AERO: 1.0000 | INHALATION_SPRAY | CUTANEOUS | Status: DC | PRN

## 2022-03-21 MED ORDER — MELATONIN 5 MG PO TABS
5.0000 mg | ORAL_TABLET | Freq: Every evening | ORAL | Status: DC | PRN
  Administered 2022-03-21: 5 mg via ORAL
  Filled 2022-03-21: qty 1

## 2022-03-21 MED ORDER — HEPARIN SODIUM (PORCINE) 1000 UNIT/ML DIALYSIS: 1000.0000 [IU] | INTRAMUSCULAR | Status: DC | PRN

## 2022-03-21 NOTE — Progress Notes (Signed)
  Progress Note   Patient: [name] GEX:[id number] DOB: 1965-10-12 DOA: 03/18/2022     3 DOS: the patient was seen and examined on 03/21/2022   Brief hospital course: Mr. Cowens was admitted to the hospital with the working diagnosis of acute hypoxemic respiratory failure due to volume overload.   56 yo male with the past medical history of hypertension, paroxysmal atrial fibrillation, W1UU, diastolic heart failure and ESRD on who presented with dyspnea. Reported 4 days of progressive dyspnea. On his initial physical examination his 02 saturation was 86% on room air, blood pressure 205/120, HR 97, RR 30 and 02 saturation 88% on room air, lungs with decreased breath sounds with no wheezing, heart with S1 and S2 present and rhythmic, with no gallops, abdomen with no distention and no lower extremity edema.   Na 135, K 4,3 Cl 99, bicarbonate 22, glucose 157, bun 75 cr 14,4  BNP 527  Wbc 11,5 hgb 11,0 plt 403  Sars covid 19 negative Influenza negative   Chest radiograph with bilateral interstitial infiltrates, with cephalization of the vasculature.  CT chest with bilateral ground glass opacities, with bilateral atelectasis at bases with small right pleural effusion.  Negative for pulmonary embolism.   Assessment and Plan: ESRD (end stage renal disease) ([place]) Volume overload in the setting of ESRD His volume has improved with ultrafiltration  Acute hypoxemic respiratory failure due to acute pulmonary edema due to ERSD.  Oxygenation is 96 to 04% on room air. Clinically pneumonia has been ruled out (noted mild elevation of procalcitonin but other clinical or clear radiographic evidence of pulmonary infection, will continue to hold on antibiotic therapy).   AF (paroxysmal atrial fibrillation) (HCC) Continue rate control with carvedilol and anticoagulation with apixaban.  Continue telemetry monitoring   Essential hypertension Continue blood pressure control with amlodipine and carvedilol.   Continue close monitoring blood pressure.   Type 2 diabetes mellitus with hyperlipidemia (HCC) Fasting glucose this monitoring 129, plan to continue to hold on insulin therapy.   Class 2 obesity Calculated BMI is 35,18        Subjective: patient with improvement in cough and dyspnea, no chest pain, no nausea or vomiting.   Physical Exam: Vitals:   03/21/22 1200 03/21/22 1214 03/21/22 1252 03/21/22 1502  BP: 132/70 113/71  (!) 132/90  Pulse: 68 71  64  Resp: 18 16    Temp:  98 F (36.7 C)  98.5 F (36.9 C)  TempSrc:  Oral  Oral  SpO2: 92% 97%    Weight:   101.9 kg   Height:       Neurology awake and alert ENT with mild pallor Cardiovascular with S1 and S2 present and rhythmic with no gallops or murmurs Respiratory with no rales or wheezing, no rhonchi Abdomen with no distention No lower extremity edema   Data Reviewed   Family Communication:  no family at the bedside   Disposition: Status is: Inpatient   Planned Discharge Destination: Home      Author: Tawni Millers, MD 03/21/2022 4:17 PM  For on call review [email or web address].

## 2022-03-21 NOTE — Assessment & Plan Note (Signed)
Continue rate control with carvedilol and anticoagulation with apixaban.  Continue telemetry monitoring

## 2022-03-21 NOTE — Progress Notes (Addendum)
Received patient in bed.Alert and oriented x 4. Denies pain.Vitals stable.  Access used : Left upper arm fistula that works well .  Duration of treatment : 3 hours and 30 minutes.  Medicines given. Heparin 2000 unit bolus at the start of treatment.                              Hectorol 73mg.  Fluid removed; 3.4liters.  Hemodialysis treatment issue:None.  Hands off to the floor nurse.

## 2022-03-21 NOTE — Assessment & Plan Note (Signed)
Volume overload in the setting of ESRD His volume has improved with ultrafiltration Fluid balance is negative at the time of his discharge -4,920 ml.   Acute hypoxemic respiratory failure due to acute pulmonary edema due to ERSD.  Oxygenation is 98% on room air.  Clinically pneumonia has been ruled out (noted mild elevation of procalcitonin but no other clinical definite radiographic evidence of pulmonary infection).

## 2022-03-21 NOTE — Assessment & Plan Note (Signed)
>>  ASSESSMENT AND PLAN FOR ESSENTIAL HYPERTENSION WRITTEN ON 03/22/2022  9:24 AM BY Tawni Millers, MD  Continue blood pressure control with amlodipine and carvedilol.  Systolic blood pressure 478 to 140 mmHg.

## 2022-03-21 NOTE — Progress Notes (Signed)
[facility] Progress Note   Subjective:   Seen prior to HD. Was on room air overnight. Breathing improved but not quite to baseline. No CP, dizziness or nausea.   Objective Vitals:   03/20/22 1110 03/20/22 1747 03/20/22 2107 03/21/22 0500  BP: (!) 109/96 121/68 119/64 121/65  Pulse: 71 70 71 70  Resp: '18 18 19 18  '$ Temp: 97.7 F (36.5 C) 98.3 F (36.8 C) 97.9 F (36.6 C) 98.1 F (36.7 C)  TempSrc: Oral Oral Oral Oral  SpO2: 98% 91% 91% 91%  Weight:   108.7 kg   Height:       Physical Exam General: Alert male in NAD Heart: RRR, no murmurs, rubs or gallops Lungs: CTA bilaterally without wheezing, rhonchi or rales. Abdomen: Soft, non-distended, +BS Extremities: No edema b/l lower extremities Dialysis Access:  LUE AVF+ bruit  Additional Objective Labs: Basic Metabolic Panel: Recent Labs  Lab 03/19/22 0305 03/19/22 1343 03/21/22 0416 03/21/22 0715  NA 133*  --  136 135  K 4.1  --  4.2 4.3  CL 95*  --  96* 98  CO2 21*  --  24 24  GLUCOSE 180*  --  130* 129*  BUN 79*  --  76* 75*  CREATININE 15.25*  --  13.64* 14.89*  CALCIUM 9.3  --  9.6 9.5  PHOS  --  7.3* 6.9* 6.9*   Liver Function Tests: Recent Labs  Lab 03/19/22 0305 03/21/22 0715  AST 12*  --   ALT 8  --   ALKPHOS 77  --   BILITOT 0.6  --   PROT 6.5  --   ALBUMIN 2.9* 2.8*   No results for input(s): "LIPASE", "AMYLASE" in the last 168 hours. CBC: Recent Labs  Lab 03/18/22 1816 03/19/22 0305 03/21/22 0416 03/21/22 0715  WBC 11.5* 11.5* 8.4 8.3  NEUTROABS 9.1* 8.6*  --   --   HGB 11.0* 10.3* 9.5* 9.7*  HCT 33.2* 31.1* 27.5* 28.3*  MCV 94.3 95.1 93.5 93.7  PLT 403* 373 362 364   Blood Culture    Component Value Date/Time   SDES BLOOD RIGHT ANTECUBITAL 03/19/2022 0305   SPECREQUEST  03/19/2022 0305    BOTTLES DRAWN AEROBIC AND ANAEROBIC Blood Culture results may not be optimal due to an excessive volume of blood received in culture bottles   CULT  03/19/2022 0305    NO  GROWTH 1 DAY Performed at [facility] [address] [place], [place] [address]    REPTSTATUS PENDING 03/19/2022 4696    Cardiac Enzymes: No results for input(s): "CKTOTAL", "CKMB", "CKMBINDEX", "TROPONINI" in the last 168 hours. CBG: Recent Labs  Lab 03/20/22 1112  GLUCAP 161*   Iron Studies:  Recent Labs    03/19/22 0305  FERRITIN 600*   '@lablastinr3'$ @ Studies/Results: No results found. Medications:   amLODipine  10 mg Oral Daily   apixaban  5 mg Oral BID   calcium acetate  667 mg Oral TID with meals   carvedilol  25 mg Oral BID WC   Chlorhexidine Gluconate Cloth  6 each Topical Q0600   Chlorhexidine Gluconate Cloth  6 each Topical Q0600   cinacalcet  180 mg Oral Q M,W,F-HD   doxercalciferol  5 mcg Intravenous Q M,W,F-HD   gabapentin  300 mg Oral Daily   loratadine  10 mg Oral Daily   multivitamin  1 tablet Oral QHS    Outpatient Dialysis Orders: SW MWF 4h 47mn   500/1.5   103.5kg  2/2 bath  Hep 4100   LUA AVF - high wt gains - last HD 12/6 , post wt 104.7 - hectorol 7 ug IV tiw - sensipar 180 po tiw - no esa, last Hb11.0  Assessment/Plan: AHRF - CXR + pulm edema and CTA showing multifocal PNA. IV abx per pmd. Breathing improving, further UF with HD today ESRD- on HD MWF. Missed HD last Friday, had it here Monday, resume MWF schedule BP/ vol - BP's improved, vol overloaded, max UF w/ HD  MBD ckd - CCa high, phos elevated. Decreased IV vdra and continued sensipar and phoslo as binder.  Anemia esrd - Hb 11 > 9.7 here, not on esa at OP unit. Will start ESA with next HD if still not to goal.  DM2 - per pmd  Anice Paganini, PA-C 03/21/2022, 8:20 AM  [facility] Pager: [phone]

## 2022-03-21 NOTE — Assessment & Plan Note (Signed)
Continue blood pressure control with amlodipine and carvedilol.  Systolic blood pressure 820 to 140 mmHg.

## 2022-03-21 NOTE — Plan of Care (Signed)
  Problem: Health Behavior/Discharge Planning: Goal: Ability to manage health-related needs will improve Outcome: Progressing   

## 2022-03-21 NOTE — Assessment & Plan Note (Signed)
Calculated BMI is 35,18

## 2022-03-21 NOTE — Assessment & Plan Note (Addendum)
His glucose remained stable during his hospitalization. At the time of his discharge his fasting glucose is 127 mg/dl. Plan to continue insulin therapy at home with close follow up on capillary glucose.

## 2022-03-21 NOTE — Hospital Course (Signed)
[name] was admitted to the hospital with the working diagnosis of acute hypoxemic respiratory failure due to volume overload.   56 yo male with the past medical history of hypertension, paroxysmal atrial fibrillation, E5UD, diastolic heart failure and ESRD on who presented with dyspnea. Reported 4 days of progressive dyspnea. On his initial physical examination his 02 saturation was 86% on room air, blood pressure 205/120, HR 97, RR 30 and 02 saturation 88% on room air, lungs with decreased breath sounds with no wheezing, heart with S1 and S2 present and rhythmic, with no gallops, abdomen with no distention and no lower extremity edema.   Na 135, K 4,3 Cl 99, bicarbonate 22, glucose 157, bun 75 cr 14,4  BNP 527  Wbc 11,5 hgb 11,0 plt 403  Sars covid 19 negative Influenza negative   Chest radiograph with bilateral interstitial infiltrates, with cephalization of the vasculature.  CT chest with bilateral ground glass opacities, with bilateral atelectasis at bases with small right pleural effusion.  Negative for pulmonary embolism.

## 2022-03-22 LAB — BASIC METABOLIC PANEL
Anion gap: 13 (ref 5–15)
BUN: 44 mg/dL — ABNORMAL HIGH (ref 6–20)
CO2: 29 mmol/L (ref 22–32)
Calcium: 9 mg/dL (ref 8.9–10.3)
Chloride: 95 mmol/L — ABNORMAL LOW (ref 98–111)
Creatinine, Ser: 10.59 mg/dL — ABNORMAL HIGH (ref 0.61–1.24)
GFR, Estimated: 5 mL/min — ABNORMAL LOW (ref >60)
Glucose, Bld: 127 mg/dL — ABNORMAL HIGH (ref 70–99)
Potassium: 4.2 mmol/L (ref 3.5–5.1)
Sodium: 137 mmol/L (ref 135–145)

## 2022-03-22 LAB — CBC
HCT: 30.5 % — ABNORMAL LOW (ref 39.0–52.0)
Hemoglobin: 10.5 g/dL — ABNORMAL LOW (ref 13.0–17.0)
MCH: 33 pg (ref 26.0–34.0)
MCHC: 34.4 g/dL (ref 30.0–36.0)
MCV: 95.9 fL (ref 80.0–100.0)
Platelets: 405 10*3/uL — ABNORMAL HIGH (ref 150–400)
RBC: 3.18 MIL/uL — ABNORMAL LOW (ref 4.22–5.81)
RDW: 13.1 % (ref 11.5–15.5)
WBC: 8 10*3/uL (ref 4.0–10.5)
nRBC: 0 % (ref 0.0–0.2)

## 2022-03-22 MED ORDER — ONDANSETRON 4 MG PO TBDP
4.0000 mg | ORAL_TABLET | Freq: Three times a day (TID) | ORAL | Status: DC | PRN
  Administered 2022-03-22: 4 mg via ORAL
  Filled 2022-03-22: qty 1

## 2022-03-22 NOTE — Progress Notes (Signed)
DISCHARGE NOTE HOME [name] to be discharged Home per MD order. Discussed prescriptions and follow up appointments with the patient. Prescriptions given to patient; medication list explained in detail. Patient verbalized understanding.  Skin clean, dry and intact without evidence of skin break down, no evidence of skin tears noted. IV catheter discontinued intact. Site without signs and symptoms of complications. Dressing and pressure applied. Pt denies pain at the site currently. No complaints noted.  Patient free of lines, drains, and wounds.   An After Visit Summary (AVS) was printed and given to the patient. Patient escorted via wheelchair, and discharged home via private auto.  Arlyss Repress, RN

## 2022-03-22 NOTE — TOC Transition Note (Signed)
Transition of Care [facility]) - CM/SW Discharge Note   Patient Details  Name: [name] MRN: [id number] Date of Birth: Oct 20, 1965  [facility] CM/SW Contact:  Tom-Johnson, Renea Ee, RN Phone Number: 03/22/2022, 10:01 AM   Clinical Narrative:     Patient is scheduled for discharge today. Hospital f/u info on AVS. No TOC needs or recommendations noted. Family to transport at discharge. No further TOC needs noted.     Final next level of care: Home/Self Care Barriers to Discharge: Barriers Resolved   Patient Goals and CMS Choice Patient states their goals for this hospitalization and ongoing recovery are:: To return home CMS Medicare.gov Compare Post Acute Care list provided to:: Patient Choice offered to / list presented to : NA  Discharge Placement                Patient to be transferred to facility by: Family      Discharge Plan and Services                DME Arranged: N/A DME Agency: NA       HH Arranged: NA HH Agency: NA        Social Determinants of Health (SDOH) Interventions Transportation Interventions: Intervention Not Indicated, Inpatient TOC, Patient Resources (Friends/Family)   Readmission Risk Interventions     No data to display

## 2022-03-22 NOTE — Care Management Important Message (Signed)
Important Message  Patient Details  Name: [name] MRN: [id number] Date of Birth: [birth year]   Medicare Important Message Given:  Yes     Florence Antonelli 03/22/2022, 11:40 AM

## 2022-03-22 NOTE — Progress Notes (Signed)
Pt d/c to home today. Contacted [facility] to advise clinic of pt's d/c and that pt should resume care tomorrow.   Melven Sartorius Renal Navigator [phone]

## 2022-03-22 NOTE — Progress Notes (Signed)
[facility] Progress Note   Subjective:   Pt seen in room, on room air, still has a cough but no SOB. Denies CP, dizziness and nausea.   Objective Vitals:   03/21/22 1502 03/21/22 2042 03/22/22 0325 03/22/22 1004  BP: (!) 132/90 (!) 144/94 138/86 110/72  Pulse: 64 71 72 66  Resp:  '17 16 18  '$ Temp: 98.5 F (36.9 C) 98.1 F (36.7 C) 98.4 F (36.9 C) 97.7 F (36.5 C)  TempSrc: Oral Oral Oral Oral  SpO2:  98% 92% 96%  Weight:  103.2 kg    Height:       Physical Exam General: Alert male in NAD Heart: RRR, no murmurs, rubs or gallops Lungs: CTA bilaterally without wheezing, rhonchi or rales. Abdomen: Soft, non-distended, +BS Extremities: No edema b/l lower extremities Dialysis Access:  LUE AVF+ bruit     Additional Objective Labs: Basic Metabolic Panel: Recent Labs  Lab 03/19/22 1343 03/21/22 0416 03/21/22 0715 03/22/22 0701  NA  --  136 135 137  K  --  4.2 4.3 4.2  CL  --  96* 98 95*  CO2  --  '24 24 29  '$ GLUCOSE  --  130* 129* 127*  BUN  --  76* 75* 44*  CREATININE  --  13.64* 14.89* 10.59*  CALCIUM  --  9.6 9.5 9.0  PHOS 7.3* 6.9* 6.9*  --    Liver Function Tests: Recent Labs  Lab 03/19/22 0305 03/21/22 0715  AST 12*  --   ALT 8  --   ALKPHOS 77  --   BILITOT 0.6  --   PROT 6.5  --   ALBUMIN 2.9* 2.8*   No results for input(s): "LIPASE", "AMYLASE" in the last 168 hours. CBC: Recent Labs  Lab 03/18/22 1816 03/19/22 0305 03/21/22 0416 03/21/22 0715 03/22/22 0701  WBC 11.5* 11.5* 8.4 8.3 8.0  NEUTROABS 9.1* 8.6*  --   --   --   HGB 11.0* 10.3* 9.5* 9.7* 10.5*  HCT 33.2* 31.1* 27.5* 28.3* 30.5*  MCV 94.3 95.1 93.5 93.7 95.9  PLT 403* 373 362 364 405*   Blood Culture    Component Value Date/Time   SDES BLOOD RIGHT ANTECUBITAL 03/19/2022 0305   SPECREQUEST  03/19/2022 0305    BOTTLES DRAWN AEROBIC AND ANAEROBIC Blood Culture results may not be optimal due to an excessive volume of blood received in culture bottles   CULT   03/19/2022 0305    NO GROWTH 2 DAYS Performed at [facility], [place] [address] [place], [place] [address]    REPTSTATUS PENDING 03/19/2022 6045    Cardiac Enzymes: No results for input(s): "CKTOTAL", "CKMB", "CKMBINDEX", "TROPONINI" in the last 168 hours. CBG: Recent Labs  Lab 03/20/22 1112  GLUCAP 161*   Iron Studies: No results for input(s): "IRON", "TIBC", "TRANSFERRIN", "FERRITIN" in the last 72 hours. '@lablastinr3'$ @ Studies/Results: No results found. Medications:   amLODipine  10 mg Oral Daily   apixaban  5 mg Oral BID   calcium acetate  667 mg Oral TID with meals   carvedilol  25 mg Oral BID WC   Chlorhexidine Gluconate Cloth  6 each Topical Q0600   cinacalcet  180 mg Oral Q M,W,F-HD   doxercalciferol  5 mcg Intravenous Q M,W,F-HD   gabapentin  300 mg Oral Daily   loratadine  10 mg Oral Daily   multivitamin  1 tablet Oral QHS    Dialysis Orders: SW MWF 4h 67mn   500/1.5  103.5kg  2/2 bath  Hep 4100   LUA AVF - high wt gains - last HD 12/6 , post wt 104.7 - hectorol 7 ug IV tiw - sensipar 180 po tiw - no esa, last Hb11.0  Assessment/Plan: AHRF - CXR + pulm edema and CTA showing multifocal PNA. IV abx per pmd. Breathing improving, back to his EDW ESRD- on HD MWF. Missed HD last Friday, had it here Monday, resume MWF schedule BP/ vol - improved, appears to be close to euvolemic MBD ckd - CCa high, phos elevated. Decreased IV vdra and continued sensipar and phoslo as binder.  Anemia esrd - Hb 10.5, not on ESA outpatient DM2 - per pmd  Anice Paganini, PA-C 03/22/2022, 10:05 AM  [facility] Pager: [phone]

## 2022-03-22 NOTE — Care Management Important Message (Signed)
Important Message  Patient Details  Name: [name] MRN: [id number] Date of Birth: [birth year]   Medicare Important Message Given:  Yes  Patient left prior to IM delivery will mail IM to the patient  home address.    Tiffney Haughton 03/22/2022, 2:22 PM

## 2022-03-22 NOTE — Discharge Summary (Signed)
Physician Discharge Summary   Patient: [name] MRN: [id number] DOB: November 09, 1965  Admit date:     03/18/2022  Discharge date: 03/22/22  Discharge Physician: [name]   PCP: [name] Bill, MD   Recommendations at discharge:    Patient will continue renal replacement therapy as outpatient, adjustments to hemodialysis treatments per nephrology.   Discharge Diagnoses: Active Problems:   ESRD (end stage renal disease) (HCC)   AF (paroxysmal atrial fibrillation) (HCC)   Essential hypertension   Type 2 diabetes mellitus with hyperlipidemia (HCC)   Class 2 obesity  Resolved Problems:   * No resolved hospital problems. [facility] Course: [name] was admitted to the hospital with the working diagnosis of acute hypoxemic respiratory failure due to volume overload.   56 yo male with the past medical history of hypertension, paroxysmal atrial fibrillation, D2KG, diastolic heart failure and ESRD on who presented with dyspnea. Reported 4 days of progressive dyspnea. On his initial physical examination his 02 saturation was 86% on room air, blood pressure 205/120, HR 97, RR 30 and 02 saturation 88% on room air, lungs with decreased breath sounds with no wheezing, heart with S1 and S2 present and rhythmic, with no gallops, abdomen with no distention and no lower extremity edema.   Na 135, K 4,3 Cl 99, bicarbonate 22, glucose 157, bun 75 cr 14,4  BNP 527  Wbc 11,5 hgb 11,0 plt 403  Sars covid 19 negative Influenza negative   Chest radiograph with bilateral interstitial infiltrates, with cephalization of the vasculature.  CT chest with bilateral ground glass opacities, with bilateral atelectasis at bases with small right pleural effusion.  Negative for pulmonary embolism.   Assessment and Plan: ESRD (end stage renal disease) ([place]) Volume overload in the setting of ESRD His volume has improved with ultrafiltration Fluid balance is negative at the time of his discharge  -4,920 ml.   Acute hypoxemic respiratory failure due to acute pulmonary edema due to ERSD.  Oxygenation is 98% on room air.  Clinically pneumonia has been ruled out (noted mild elevation of procalcitonin but no other clinical definite radiographic evidence of pulmonary infection).   AF (paroxysmal atrial fibrillation) (HCC) Continue rate control with carvedilol and anticoagulation with apixaban.   Essential hypertension Continue blood pressure control with amlodipine and carvedilol.  Systolic blood pressure 254 to 140 mmHg.   Type 2 diabetes mellitus with hyperlipidemia ([place]) His glucose remained stable during his hospitalization. At the time of his discharge his fasting glucose is 127 mg/dl. Plan to continue insulin therapy at home with close follow up on capillary glucose.   Class 2 obesity Calculated BMI is 35,18         Consultants: nephrology  Procedures performed: none   Disposition: Home Diet recommendation:  Cardiac and Carb modified diet DISCHARGE MEDICATION: Allergies as of 03/22/2022   No Known Allergies      Medication List     STOP taking these medications    oxyCODONE-acetaminophen 5-325 MG tablet Commonly known as: Percocet   sevelamer carbonate 800 MG tablet Commonly known as: RENVELA       TAKE these medications    Accu-Chek Softclix Lancets lancets Use as directed.  For insulin-dependent type 2 diabetes Monitor  glucose 3 times a day   acetaminophen 500 MG tablet Commonly known as: TYLENOL Take 1,000-1,500 mg by mouth 2 (two) times daily as needed (pain).   Advair HFA 115-21 MCG/ACT inhaler Generic drug: fluticasone-salmeterol Inhale 1 puff into the lungs 2 (  two) times daily.   albuterol 108 (90 Base) MCG/ACT inhaler Commonly known as: VENTOLIN HFA Inhale 2 puffs into the lungs every 6 (six) hours as needed for wheezing or shortness of breath.   amLODipine 10 MG tablet Commonly known as: NORVASC Take 1 tablet (10 mg total)  by mouth daily.   benzonatate 100 MG capsule Commonly known as: TESSALON Take 100 mg by mouth 3 (three) times daily as needed for cough.   calcitRIOL 0.25 MCG capsule Commonly known as: ROCALTROL Take 1 capsule (0.25 mcg total) by mouth every Monday, Wednesday, and Friday.   Calcium Acetate 667 MG Tabs Take 667 mg by mouth with breakfast, with lunch, and with evening meal.   carvedilol 25 MG tablet Commonly known as: COREG Take 1 tablet (25 mg total) by mouth 2 (two) times daily with a meal.   cetirizine 10 MG tablet Commonly known as: ZYRTEC Take 10 mg by mouth daily.   Eliquis 5 MG Tabs tablet Generic drug: apixaban Take 5 mg by mouth 2 (two) times daily.   fluticasone 50 MCG/ACT nasal spray Commonly known as: FLONASE Place 1 spray into both nostrils 2 (two) times daily.   gabapentin 300 MG capsule Commonly known as: NEURONTIN Take 1 capsule (300 mg total) by mouth daily. What changed: when to take this   glucose blood test strip Use as instructed For insulin-dependent type 2 diabetes Monitor  glucose 3 times a day   insulin NPH-regular Human (70-30) 100 UNIT/ML injection Inject 15 Units into the skin 2 (two) times daily. Ac breakfast and supper   multivitamin Tabs tablet Take 1 tablet by mouth at bedtime.   tiZANidine 4 MG tablet Commonly known as: ZANAFLEX Take 4 mg by mouth every 8 (eight) hours as needed for muscle spasms.   traMADol 50 MG tablet Commonly known as: ULTRAM Take 50 mg by mouth daily as needed (pain).   Vitamin D (Ergocalciferol) 1.25 MG (50000 UNIT) Caps capsule Commonly known as: DRISDOL Take 50,000 Units by mouth every Sunday.        Discharge Exam: Filed Weights   03/21/22 0820 03/21/22 1252 03/21/22 2042  Weight: 103.5 kg 101.9 kg 103.2 kg   BP 138/86 (BP Location: Right Arm)   Pulse 72   Temp 98.4 F (36.9 C) (Oral)   Resp 16   Ht '5\' 7"'$  (1.702 m)   Wt 103.2 kg   SpO2 92%   BMI 35.63 kg/m   Patient is feeling  better, no dyspnea or chest pain, no cough.   Neurology awake and alert ENT with mild pallor Cardiovascular with S1 and S2 present and rhythmic with no gallops rubs or murmurs Respiratory with no rales or wheezing, no rhonchi Abdomen with no distention  No lower extremity edema   Condition at discharge: stable  The results of significant diagnostics from this hospitalization (including imaging, microbiology, ancillary and laboratory) are listed below for reference.   Imaging Studies: CT Angio Chest PE W and/or Wo Contrast  Result Date: 03/19/2022 CLINICAL DATA:  Pulmonary embolism (PE) suspected, high prob. Cough, dyspnea. EXAM: CT ANGIOGRAPHY CHEST WITH CONTRAST TECHNIQUE: Multidetector CT imaging of the chest was performed using the standard protocol during bolus administration of intravenous contrast. Multiplanar CT image reconstructions and MIPs were obtained to evaluate the vascular anatomy. RADIATION DOSE REDUCTION: This exam was performed according to the departmental dose-optimization program which includes automated exposure control, adjustment of the mA and/or kV according to patient size and/or use of iterative reconstruction technique. CONTRAST:  69m OMNIPAQUE IOHEXOL 350 MG/ML SOLN COMPARISON:  None Available. FINDINGS: Cardiovascular: Adequate opacification of the pulmonary arterial tree. No intraluminal filling defect identified through the segmental level to suggest acute pulmonary embolism. Central pulmonary arteries are of normal caliber. Moderate coronary artery calcification. Mild cardiomegaly with left ventricular hypertrophy. No pericardial effusion. Minimal atherosclerotic calcification within the thoracic aorta. No aortic aneurysm. Mediastinum/Nodes: There are multiple mildly enlarged mediastinal and bilateral hilar lymph nodes, possibly reactive in nature, measuring up to 15 mm within the right paratracheal region. The esophagus is unremarkable. Visualized thyroid is  unremarkable. Lungs/Pleura: There are extensive bilateral centrilobular nodular infiltrates in keeping with acute infection or, less likely given their distribution, aspiration in the acute setting. 8 mm nodule within the right minor fissure may represent intrapulmonary lymph node. There is mild superimposed interstitial pulmonary edema and trace bilateral pleural effusions are present. Mild bibasilar atelectasis. No pneumothorax. No central obstructing lesion. Upper Abdomen: No acute abnormality. Musculoskeletal: No chest wall abnormality. No acute or significant osseous findings. Review of the MIP images confirms the above findings. IMPRESSION: 1. No pulmonary embolism. 2. Extensive bilateral centrilobular nodular infiltrates in keeping with acute infection or, less likely given their distribution, aspiration in the acute setting. 3. Mild superimposed interstitial pulmonary edema and trace bilateral pleural effusions. 4. Mild cardiomegaly with left ventricular hypertrophy. Moderate coronary artery calcification. 5. Mild mediastinal and bilateral hilar lymphadenopathy, possibly reactive in nature. 6. 8 mm nodule within the right minor fissure, possibly representing intrapulmonary lymph node. Aortic Atherosclerosis (ICD10-I70.0). Electronically Signed   By: AFidela SalisburyM.D.   On: 03/19/2022 00:56   DG Chest 2 View  Result Date: 03/18/2022 CLINICAL DATA:  Shortness of breath. EXAM: CHEST - 2 VIEW COMPARISON:  12/20/2021 FINDINGS: Previous dialysis catheter is been removed. The heart is mildly enlarged. Small pleural effusions with fluid in the fissures. There is pulmonary edema with septal thickening peripherally. No confluent consolidation. No pneumothorax. No acute osseous findings. IMPRESSION: Findings consistent with CHF. Cardiomegaly with pulmonary edema and small pleural effusions. Electronically Signed   By: MKeith RakeM.D.   On: 03/18/2022 18:38    Microbiology: Results for orders placed or  performed during the hospital encounter of 03/18/22  Resp Panel by RT-PCR (Flu A&B, Covid) Anterior Nasal Swab     Status: None   Collection Time: 03/18/22 10:51 PM   Specimen: Anterior Nasal Swab  Result Value Ref Range Status   SARS Coronavirus 2 by RT PCR NEGATIVE NEGATIVE Final    Comment: (NOTE) SARS-CoV-2 target nucleic acids are NOT DETECTED.  The SARS-CoV-2 RNA is generally detectable in upper respiratory specimens during the acute phase of infection. The lowest concentration of SARS-CoV-2 viral copies this assay can detect is 138 copies/mL. A negative result does not preclude SARS-Cov-2 infection and should not be used as the sole basis for treatment or other patient management decisions. A negative result may occur with  improper specimen collection/handling, submission of specimen other than nasopharyngeal swab, presence of viral mutation(s) within the areas targeted by this assay, and inadequate number of viral copies(<138 copies/mL). A negative result must be combined with clinical observations, patient history, and epidemiological information. The expected result is Negative.  Fact Sheet for Patients:  hEntrepreneurPulse.com.au Fact Sheet for Healthcare Providers:  hIncredibleEmployment.be This test is no t yet approved or cleared by the [place] and  has been authorized for detection and/or diagnosis of SARS-CoV-2 by FDA under an Emergency Use Authorization (EUA). This EUA will remain  in effect (meaning this test can be used) for the duration of the COVID-19 declaration under Section 564(b)(1) of the Act, 21 U.S.C.section 360bbb-3(b)(1), unless the authorization is terminated  or revoked sooner.       Influenza A by PCR NEGATIVE NEGATIVE Final   Influenza B by PCR NEGATIVE NEGATIVE Final    Comment: (NOTE) The Xpert Xpress SARS-CoV-2/FLU/RSV plus assay is intended as an aid in the diagnosis of influenza from  Nasopharyngeal swab specimens and should not be used as a sole basis for treatment. Nasal washings and aspirates are unacceptable for Xpert Xpress SARS-CoV-2/FLU/RSV testing.  Fact Sheet for Patients: EntrepreneurPulse.com.au  Fact Sheet for Healthcare Providers: IncredibleEmployment.be  This test is not yet approved or cleared by the [place] FDA and has been authorized for detection and/or diagnosis of SARS-CoV-2 by FDA under an Emergency Use Authorization (EUA). This EUA will remain in effect (meaning this test can be used) for the duration of the COVID-19 declaration under Section 564(b)(1) of the Act, 21 U.S.C. section 360bbb-3(b)(1), unless the authorization is terminated or revoked.  Performed at [facility], [place] [address], [place], [place] [address]   Respiratory (~20 pathogens) panel by PCR     Status: None   Collection Time: 03/18/22 10:51 PM   Specimen: Peripheral; Respiratory  Result Value Ref Range Status   Adenovirus NOT DETECTED NOT DETECTED Final   Coronavirus 229E NOT DETECTED NOT DETECTED Final    Comment: (NOTE) The Coronavirus on the Respiratory Panel, DOES NOT test for the novel  Coronavirus (2019 nCoV)    Coronavirus HKU1 NOT DETECTED NOT DETECTED Final   Coronavirus NL63 NOT DETECTED NOT DETECTED Final   Coronavirus OC43 NOT DETECTED NOT DETECTED Final   Metapneumovirus NOT DETECTED NOT DETECTED Final   Rhinovirus / Enterovirus NOT DETECTED NOT DETECTED Final   Influenza A NOT DETECTED NOT DETECTED Final   Influenza B NOT DETECTED NOT DETECTED Final   Parainfluenza Virus 1 NOT DETECTED NOT DETECTED Final   Parainfluenza Virus 2 NOT DETECTED NOT DETECTED Final   Parainfluenza Virus 3 NOT DETECTED NOT DETECTED Final   Parainfluenza Virus 4 NOT DETECTED NOT DETECTED Final   Respiratory Syncytial Virus NOT DETECTED NOT DETECTED Final   Bordetella pertussis NOT DETECTED NOT DETECTED Final   Bordetella  Parapertussis NOT DETECTED NOT DETECTED Final   Chlamydophila pneumoniae NOT DETECTED NOT DETECTED Final   Mycoplasma pneumoniae NOT DETECTED NOT DETECTED Final    Comment: Performed at [facility], [place]. [address] [place], [place] [address]  Bordetella pertussis PCR     Status: None   Collection Time: 03/18/22 10:51 PM   Specimen: Nasopharyngeal  Result Value Ref Range Status   B pertussis, DNA Negative Negative Final   B parapertussis, DNA Negative Negative Final    Comment: (NOTE) This test was developed and its performance characteristics determined by [employer]. It has not been cleared or approved by the U.S. Food and Drug Administration. The FDA has determined that such clearance or approval is not necessary. This test is used for clinical purposes. It should not be regarded as investigational or research. Performed At: [facility] [place], [place] [id number] Rush Farmer MD GM:[id number]   Blood culture (routine x 2)     Status: None (Preliminary result)   Collection Time: 03/18/22 11:02 PM   Specimen: BLOOD RIGHT ARM  Result Value Ref Range Status   Specimen Description BLOOD RIGHT ARM  Final   Special Requests  Final    BOTTLES DRAWN AEROBIC AND ANAEROBIC Blood Culture adequate volume   Culture   Final    NO GROWTH 3 DAYS Performed at [name], [place] [address] [place], [place] [address]    Report Status PENDING  Incomplete  Blood culture (routine x 2)     Status: None (Preliminary result)   Collection Time: 03/19/22  3:05 AM   Specimen: BLOOD  Result Value Ref Range Status   Specimen Description BLOOD RIGHT ANTECUBITAL  Final   Special Requests   Final    BOTTLES DRAWN AEROBIC AND ANAEROBIC Blood Culture results may not be optimal due to an excessive volume of blood received in culture bottles   Culture   Final    NO GROWTH 2 DAYS Performed at [facility], [place] [address] [place],  [address]     Report Status PENDING  Incomplete    Labs: CBC: Recent Labs  Lab 03/18/22 1816 03/19/22 0305 03/21/22 0416 03/21/22 0715 03/22/22 0701  WBC 11.5* 11.5* 8.4 8.3 8.0  NEUTROABS 9.1* 8.6*  --   --   --   HGB 11.0* 10.3* 9.5* 9.7* 10.5*  HCT 33.2* 31.1* 27.5* 28.3* 30.5*  MCV 94.3 95.1 93.5 93.7 95.9  PLT 403* 373 362 364 836*   Basic Metabolic Panel: Recent Labs  Lab 03/18/22 1816 03/19/22 0305 03/19/22 1343 03/21/22 0416 03/21/22 0715 03/22/22 0701  NA 135 133*  --  136 135 137  K 4.2 4.1  --  4.2 4.3 4.2  CL 99 95*  --  96* 98 95*  CO2 22 21*  --  '24 24 29  '$ GLUCOSE 157* 180*  --  130* 129* 127*  BUN 75* 79*  --  76* 75* 44*  CREATININE 14.48* 15.25*  --  13.64* 14.89* 10.59*  CALCIUM 9.5 9.3  --  9.6 9.5 9.0  MG  --   --   --  2.8*  --   --   PHOS  --   --  7.3* 6.9* 6.9*  --    Liver Function Tests: Recent Labs  Lab 03/19/22 0305 03/21/22 0715  AST 12*  --   ALT 8  --   ALKPHOS 77  --   BILITOT 0.6  --   PROT 6.5  --   ALBUMIN 2.9* 2.8*   CBG: Recent Labs  Lab 03/20/22 1112  GLUCAP 161*    Discharge time spent: greater than 30 minutes.  Signed: Tawni Millers, MD [facility] 03/22/2022

## 2022-03-23 LAB — CULTURE, BLOOD (ROUTINE X 2)
Culture: NO GROWTH
Special Requests: ADEQUATE

## 2022-03-24 LAB — CULTURE, BLOOD (ROUTINE X 2): Culture: NO GROWTH

## 2022-04-26 ENCOUNTER — Encounter: Payer: Self-pay | Admitting: Podiatry

## 2022-04-26 ENCOUNTER — Ambulatory Visit (INDEPENDENT_AMBULATORY_CARE_PROVIDER_SITE_OTHER): Payer: Medicare Other | Admitting: Podiatry

## 2022-04-26 VITALS — BP 215/127 | HR 87

## 2022-04-26 DIAGNOSIS — M7662 Achilles tendinitis, left leg: Secondary | ICD-10-CM

## 2022-04-26 DIAGNOSIS — M79676 Pain in unspecified toe(s): Secondary | ICD-10-CM

## 2022-04-26 DIAGNOSIS — B351 Tinea unguium: Secondary | ICD-10-CM | POA: Diagnosis not present

## 2022-04-26 DIAGNOSIS — E1142 Type 2 diabetes mellitus with diabetic polyneuropathy: Secondary | ICD-10-CM

## 2022-04-26 NOTE — Progress Notes (Signed)
He presents today for follow-up of his Achilles tendinosis that he states is really the same have not heard back any from anybody regarding the MRI he is also requesting a nail trim states that he cannot really do it.  Objective: Vital signs stable he is alert and oriented x 3 pulses are palpable.  Still has tenderness on palpation of his left Achilles tendon at its insertion site.  MRI did demonstrate Achilles tendinosis severe in nature.  I had ordered physical therapy but obviously they never called him.  His nails are long thick yellow dystrophic onychomycotic.  Assessment: Pain in limb secondary to chronic Achilles tendinitis his left and painful elongated toenails.  Plan: Discussed etiology pathology and surgical therapies at this point I debrided his toenails 1 through 5 bilaterally.  And we referred him to physical therapy

## 2022-05-22 ENCOUNTER — Inpatient Hospital Stay (HOSPITAL_COMMUNITY)
Admission: EM | Admit: 2022-05-22 | Discharge: 2022-05-24 | DRG: 291 | Disposition: A | Discharge status: Home / Self Care | Payer: Medicare Other | Source: Ambulatory Visit | Attending: Internal Medicine | Admitting: Internal Medicine

## 2022-05-22 ENCOUNTER — Emergency Department (HOSPITAL_COMMUNITY): Payer: Medicare Other

## 2022-05-22 DIAGNOSIS — R0602 Shortness of breath: Secondary | ICD-10-CM | POA: Diagnosis present

## 2022-05-22 DIAGNOSIS — Z86718 Personal history of other venous thrombosis and embolism: Secondary | ICD-10-CM

## 2022-05-22 DIAGNOSIS — J9811 Atelectasis: Secondary | ICD-10-CM | POA: Diagnosis not present

## 2022-05-22 DIAGNOSIS — I77810 Thoracic aortic ectasia: Secondary | ICD-10-CM | POA: Diagnosis present

## 2022-05-22 DIAGNOSIS — Z794 Long term (current) use of insulin: Secondary | ICD-10-CM | POA: Diagnosis not present

## 2022-05-22 DIAGNOSIS — E1122 Type 2 diabetes mellitus with diabetic chronic kidney disease: Secondary | ICD-10-CM | POA: Diagnosis present

## 2022-05-22 DIAGNOSIS — N186 End stage renal disease: Secondary | ICD-10-CM | POA: Diagnosis present

## 2022-05-22 DIAGNOSIS — Z7682 Awaiting organ transplant status: Secondary | ICD-10-CM | POA: Diagnosis not present

## 2022-05-22 DIAGNOSIS — N2581 Secondary hyperparathyroidism of renal origin: Secondary | ICD-10-CM | POA: Diagnosis present

## 2022-05-22 DIAGNOSIS — J811 Chronic pulmonary edema: Secondary | ICD-10-CM | POA: Diagnosis not present

## 2022-05-22 DIAGNOSIS — R0902 Hypoxemia: Secondary | ICD-10-CM

## 2022-05-22 DIAGNOSIS — E1165 Type 2 diabetes mellitus with hyperglycemia: Secondary | ICD-10-CM | POA: Diagnosis present

## 2022-05-22 DIAGNOSIS — I5030 Unspecified diastolic (congestive) heart failure: Secondary | ICD-10-CM

## 2022-05-22 DIAGNOSIS — I132 Hypertensive heart and chronic kidney disease with heart failure and with stage 5 chronic kidney disease, or end stage renal disease: Secondary | ICD-10-CM | POA: Diagnosis present

## 2022-05-22 DIAGNOSIS — Z79899 Other long term (current) drug therapy: Secondary | ICD-10-CM | POA: Diagnosis not present

## 2022-05-22 DIAGNOSIS — Z992 Dependence on renal dialysis: Secondary | ICD-10-CM

## 2022-05-22 DIAGNOSIS — J810 Acute pulmonary edema: Secondary | ICD-10-CM | POA: Diagnosis present

## 2022-05-22 DIAGNOSIS — E1142 Type 2 diabetes mellitus with diabetic polyneuropathy: Secondary | ICD-10-CM | POA: Diagnosis present

## 2022-05-22 DIAGNOSIS — I2489 Other forms of acute ischemic heart disease: Secondary | ICD-10-CM | POA: Diagnosis present

## 2022-05-22 DIAGNOSIS — I480 Paroxysmal atrial fibrillation: Secondary | ICD-10-CM | POA: Diagnosis present

## 2022-05-22 DIAGNOSIS — D631 Anemia in chronic kidney disease: Secondary | ICD-10-CM | POA: Diagnosis present

## 2022-05-22 DIAGNOSIS — G4733 Obstructive sleep apnea (adult) (pediatric): Secondary | ICD-10-CM | POA: Diagnosis present

## 2022-05-22 DIAGNOSIS — I5032 Chronic diastolic (congestive) heart failure: Secondary | ICD-10-CM

## 2022-05-22 DIAGNOSIS — E119 Type 2 diabetes mellitus without complications: Secondary | ICD-10-CM

## 2022-05-22 DIAGNOSIS — J90 Pleural effusion, not elsewhere classified: Secondary | ICD-10-CM | POA: Diagnosis present

## 2022-05-22 DIAGNOSIS — K219 Gastro-esophageal reflux disease without esophagitis: Secondary | ICD-10-CM | POA: Diagnosis present

## 2022-05-22 DIAGNOSIS — I5A Non-ischemic myocardial injury (non-traumatic): Secondary | ICD-10-CM | POA: Diagnosis present

## 2022-05-22 DIAGNOSIS — I160 Hypertensive urgency: Secondary | ICD-10-CM | POA: Diagnosis present

## 2022-05-22 DIAGNOSIS — J9601 Acute respiratory failure with hypoxia: Secondary | ICD-10-CM | POA: Diagnosis not present

## 2022-05-22 DIAGNOSIS — I10 Essential (primary) hypertension: Secondary | ICD-10-CM | POA: Diagnosis present

## 2022-05-22 DIAGNOSIS — Z8249 Family history of ischemic heart disease and other diseases of the circulatory system: Secondary | ICD-10-CM | POA: Diagnosis not present

## 2022-05-22 DIAGNOSIS — I5033 Acute on chronic diastolic (congestive) heart failure: Secondary | ICD-10-CM | POA: Diagnosis present

## 2022-05-22 DIAGNOSIS — R7989 Other specified abnormal findings of blood chemistry: Secondary | ICD-10-CM | POA: Diagnosis present

## 2022-05-22 LAB — CBC WITH DIFFERENTIAL/PLATELET
Abs Immature Granulocytes: 0.03 10*3/uL (ref 0.00–0.07)
Basophils Absolute: 0.1 10*3/uL (ref 0.0–0.1)
Basophils Relative: 1 %
Eosinophils Absolute: 0.2 10*3/uL (ref 0.0–0.5)
Eosinophils Relative: 3 %
HCT: 34.5 % — ABNORMAL LOW (ref 39.0–52.0)
Hemoglobin: 11.1 g/dL — ABNORMAL LOW (ref 13.0–17.0)
Immature Granulocytes: 0 %
Lymphocytes Relative: 29 %
Lymphs Abs: 2.6 10*3/uL (ref 0.7–4.0)
MCH: 30.9 pg (ref 26.0–34.0)
MCHC: 32.2 g/dL (ref 30.0–36.0)
MCV: 96.1 fL (ref 80.0–100.0)
Monocytes Absolute: 0.6 10*3/uL (ref 0.1–1.0)
Monocytes Relative: 7 %
Neutro Abs: 5.4 10*3/uL (ref 1.7–7.7)
Neutrophils Relative %: 60 %
Platelets: 384 10*3/uL (ref 150–400)
RBC: 3.59 MIL/uL — ABNORMAL LOW (ref 4.22–5.81)
RDW: 14.2 % (ref 11.5–15.5)
WBC: 8.9 10*3/uL (ref 4.0–10.5)
nRBC: 0 % (ref 0.0–0.2)

## 2022-05-22 LAB — COMPREHENSIVE METABOLIC PANEL
ALT: 9 U/L (ref 0–44)
AST: 15 U/L (ref 15–41)
Albumin: 3.2 g/dL — ABNORMAL LOW (ref 3.5–5.0)
Alkaline Phosphatase: 62 U/L (ref 38–126)
Anion gap: 13 (ref 5–15)
BUN: 30 mg/dL — ABNORMAL HIGH (ref 6–20)
CO2: 28 mmol/L (ref 22–32)
Calcium: 9.9 mg/dL (ref 8.9–10.3)
Chloride: 96 mmol/L — ABNORMAL LOW (ref 98–111)
Creatinine, Ser: 8.58 mg/dL — ABNORMAL HIGH (ref 0.61–1.24)
GFR, Estimated: 7 mL/min — ABNORMAL LOW (ref >60)
Glucose, Bld: 120 mg/dL — ABNORMAL HIGH (ref 70–99)
Potassium: 3.5 mmol/L (ref 3.5–5.1)
Sodium: 137 mmol/L (ref 135–145)
Total Bilirubin: 0.3 mg/dL (ref 0.3–1.2)
Total Protein: 6.9 g/dL (ref 6.5–8.1)

## 2022-05-22 LAB — TROPONIN I (HIGH SENSITIVITY)
Troponin I (High Sensitivity): 114 ng/L (ref <18)
Troponin I (High Sensitivity): 103 ng/L (ref <18)

## 2022-05-22 LAB — BRAIN NATRIURETIC PEPTIDE: B Natriuretic Peptide: 450.2 pg/mL — ABNORMAL HIGH (ref 0.0–100.0)

## 2022-05-22 MED ORDER — CARVEDILOL 25 MG PO TABS
25.0000 mg | ORAL_TABLET | Freq: Two times a day (BID) | ORAL | Status: DC
  Administered 2022-05-23 – 2022-05-24 (×3): 25 mg via ORAL
  Filled 2022-05-22 (×2): qty 1
  Filled 2022-05-22: qty 2

## 2022-05-22 MED ORDER — MOMETASONE FURO-FORMOTEROL FUM 200-5 MCG/ACT IN AERO
2.0000 | INHALATION_SPRAY | Freq: Two times a day (BID) | RESPIRATORY_TRACT | Status: DC
  Administered 2022-05-23: 2 via RESPIRATORY_TRACT
  Filled 2022-05-22: qty 8.8

## 2022-05-22 MED ORDER — IOHEXOL 350 MG/ML SOLN
150.0000 mL | Freq: Once | INTRAVENOUS | Status: AC | PRN
  Administered 2022-05-22: 150 mL via INTRAVENOUS

## 2022-05-22 MED ORDER — GABAPENTIN 300 MG PO CAPS
300.0000 mg | ORAL_CAPSULE | Freq: Every day | ORAL | Status: DC
  Administered 2022-05-23 – 2022-05-24 (×2): 300 mg via ORAL
  Filled 2022-05-22 (×4): qty 1

## 2022-05-22 MED ORDER — PANTOPRAZOLE SODIUM 40 MG PO TBEC
40.0000 mg | DELAYED_RELEASE_TABLET | Freq: Every day | ORAL | Status: DC
  Administered 2022-05-23 – 2022-05-24 (×2): 40 mg via ORAL
  Filled 2022-05-22 (×2): qty 1

## 2022-05-22 MED ORDER — ALBUTEROL SULFATE (2.5 MG/3ML) 0.083% IN NEBU: 3.0000 mL | INHALATION_SOLUTION | Freq: Four times a day (QID) | RESPIRATORY_TRACT | Status: DC | PRN

## 2022-05-22 MED ORDER — INSULIN ASPART 100 UNIT/ML IJ SOLN
0.0000 [IU] | Freq: Three times a day (TID) | INTRAMUSCULAR | Status: DC
  Administered 2022-05-23 – 2022-05-24 (×2): 1 [IU] via SUBCUTANEOUS

## 2022-05-22 MED ORDER — CHLORHEXIDINE GLUCONATE CLOTH 2 % EX PADS
6.0000 | MEDICATED_PAD | Freq: Every day | CUTANEOUS | Status: DC
  Administered 2022-05-23 – 2022-05-24 (×2): 6 via TOPICAL

## 2022-05-22 MED ORDER — HEPARIN SODIUM (PORCINE) 1000 UNIT/ML DIALYSIS
40.0000 [IU]/kg | INTRAMUSCULAR | Status: DC | PRN
  Administered 2022-05-22: 4200 [IU] via INTRAVENOUS_CENTRAL
  Filled 2022-05-22 (×2): qty 5

## 2022-05-22 MED ORDER — NITROGLYCERIN 0.4 MG SL SUBL
0.4000 mg | SUBLINGUAL_TABLET | SUBLINGUAL | Status: DC | PRN
  Administered 2022-05-22: 0.4 mg via SUBLINGUAL
  Filled 2022-05-22: qty 1

## 2022-05-22 MED ORDER — FUROSEMIDE 10 MG/ML IJ SOLN
60.0000 mg | Freq: Once | INTRAMUSCULAR | Status: AC
  Administered 2022-05-22: 60 mg via INTRAVENOUS
  Filled 2022-05-22: qty 6

## 2022-05-22 MED ORDER — AMLODIPINE BESYLATE 10 MG PO TABS
10.0000 mg | ORAL_TABLET | Freq: Every day | ORAL | Status: DC
  Administered 2022-05-23 – 2022-05-24 (×2): 10 mg via ORAL
  Filled 2022-05-22 (×2): qty 1
  Filled 2022-05-22: qty 2

## 2022-05-22 MED ORDER — RENA-VITE PO TABS
1.0000 | ORAL_TABLET | Freq: Every day | ORAL | Status: DC
  Administered 2022-05-23: 1 via ORAL
  Filled 2022-05-22: qty 1

## 2022-05-22 MED ORDER — VITAMIN D (ERGOCALCIFEROL) 1.25 MG (50000 UNIT) PO CAPS: 50000.0000 [IU] | ORAL_CAPSULE | ORAL | Status: DC

## 2022-05-22 MED ORDER — SEVELAMER CARBONATE 800 MG PO TABS
1600.0000 mg | ORAL_TABLET | Freq: Three times a day (TID) | ORAL | Status: DC
  Administered 2022-05-23 – 2022-05-24 (×4): 1600 mg via ORAL
  Filled 2022-05-22 (×4): qty 2

## 2022-05-22 MED ORDER — HEPARIN SODIUM (PORCINE) 5000 UNIT/ML IJ SOLN
5000.0000 [IU] | Freq: Three times a day (TID) | INTRAMUSCULAR | Status: DC
  Administered 2022-05-23 – 2022-05-24 (×4): 5000 [IU] via SUBCUTANEOUS
  Filled 2022-05-22 (×4): qty 1

## 2022-05-22 NOTE — Consult Note (Addendum)
Reason for Consult: Volume overload in patient with end-stage renal disease Referring Physician: Teressa Lower MD (EDP)  HPI:  57 year old man with past medical history significant for type 2 diabetes mellitus, hypertension, obstructive sleep apnea on CPAP, diastolic heart failure, paroxysmal atrial fibrillation and end-stage renal disease on hemodialysis on a Monday/Wednesday/Friday schedule.  Presented to the emergency room today with progressively worsening shortness of breath in spite of efforts at getting to his dry weight at dialysis.  He denies any fevers, chills, cough, sputum production, hemoptysis, wheezing, nausea, vomiting or diarrhea.  He denies any dysuria, urgency, frequency, flank pain or hematuria and reports producing about 12 ounces of urine a day.  His last dialysis treatment was yesterday with predialysis weight of 108.8 kg (goal 3.8 L) and got down to 105.7 kg.  Adherence has been decent with 1 truncated dialysis treatment on 2/7 when he ran for 2 hours and 45 minutes.  Chest x-ray here today showed evidence of bilateral pulmonary opacities consistent with edema as well as small pleural effusion.  CT angiogram without PE.  Dialysis prescription: [facility], 4 hours 15 minutes, BFR 400/DFR 600, EDW 105 kg, 2K/2.0 calcium, left upper arm AV fistula, 15 G needles, heparin 4100 unit bolus, Venofer 100 mg 3 times weekly, Hectorol 7 mcg 3 times weekly, Sensipar 180 mg 3 times weekly  Past Medical History:  Diagnosis Date   Acute kidney injury ([place]) 12/28/2014   Adjustment disorder with mixed anxiety and depressed mood 12/28/2014   CHF (congestive heart failure) (H[facility])    CPAP (continuous positive airway pressure) dependence    Diabetes mellitus without complication ([place])    DM (diabetes mellitus) type II controlled with renal manifestation ([place]) 12/28/2014   DM (diabetes mellitus) type II controlled, neurological manifestation ([place]) 12/28/2014    Encephalopathy, hypertensive 12/28/2014   GERD (gastroesophageal reflux disease)    Hypertension    Hypertensive emergency without congestive heart failure 12/27/2014   Pneumonia    Possible Panic disorder 12/28/2014   Renal disorder    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   Sleep apnea     Past Surgical History:  Procedure Laterality Date   AV FISTULA PLACEMENT Left 01/05/2020   Procedure: LEFT ARM BRACHIOCEPHALIC ARTERIOVENOUS (AV) FISTULA;  Surgeon: Angelia Mould, MD;  Location: [place];  Service: Vascular;  Laterality: Left;   EMPYEMA DRAINAGE Right 05/17/2017   Procedure: EMPYEMA DRAINAGE AND DECORTICATION;  Surgeon: Grace Isaac, MD;  Location: [place];  Service: Thoracic;  Laterality: Right;   FLEXIBLE BRONCHOSCOPY  05/17/2017   Procedure: FLEXIBLE BRONCHOSCOPY;  Surgeon: Grace Isaac, MD;  Location: [place];  Service: Thoracic;;   INSERTION OF DIALYSIS CATHETER Right 12/12/2021   Procedure: INSERTION OF RIGHT INTERNAL JUGULAR DIALYSIS CATHETER;  Surgeon: Angelia Mould, MD;  Location: [place];  Service: Vascular;  Laterality: Right;   IR FLUORO GUIDE CV LINE RIGHT  01/01/2020   IR THORACENTESIS ASP PLEURAL SPACE W/IMG GUIDE  05/17/2017   IR US GUIDE VASC ACCESS RIGHT  01/01/2020   NO PAST SURGERIES     REVISON OF ARTERIOVENOUS FISTULA Left 12/12/2021   Procedure: REVISION OF LEFT ARM FISTULA BY PLICATION;  Surgeon: Angelia Mould, MD;  Location: [place];  Service: Vascular;  Laterality: Left;   THORACOTOMY/LOBECTOMY Right 05/17/2017   Procedure: RIGHT MINI THORACOTOMY;  Surgeon: Grace Isaac, MD;  Location: [place];  Service: Thoracic;  Laterality: Right;   VIDEO ASSISTED THORACOSCOPY (VATS)/EMPYEMA Right 05/17/2017   Procedure: RIGHT VIDEO ASSISTED  THORACOSCOPY (VATS)/EMPYEMA;  Surgeon: Grace Isaac, MD;  Location: [facility];  Service: Thoracic;  Laterality: Right;    Family History  Problem Relation Age of Onset   Hypertension Mother     Social History:   reports that he has never smoked. He has never been exposed to tobacco smoke. He has never used smokeless tobacco. He reports that he does not drink alcohol and does not use drugs.  Allergies: No Known Allergies  Medications: I have reviewed the patient's current medications. Scheduled:  [START ON 05/23/2022] Chlorhexidine Gluconate Cloth  6 each Topical Q0600      Latest Ref Rng & Units 05/22/2022   10:56 AM 03/22/2022    7:01 AM 03/21/2022    7:15 AM  BMP  Glucose 70 - 99 mg/dL 120  127  129   BUN 6 - 20 mg/dL 30  44  75   Creatinine 0.61 - 1.24 mg/dL 8.58  10.59  14.89   Sodium 135 - 145 mmol/L 137  137  135   Potassium 3.5 - 5.1 mmol/L 3.5  4.2  4.3   Chloride 98 - 111 mmol/L 96  95  98   CO2 22 - 32 mmol/L 28  29  24   $ Calcium 8.9 - 10.3 mg/dL 9.9  9.0  9.5       Latest Ref Rng & Units 05/22/2022   10:56 AM 03/22/2022    7:01 AM 03/21/2022    7:15 AM  CBC  WBC 4.0 - 10.5 K/uL 8.9  8.0  8.3   Hemoglobin 13.0 - 17.0 g/dL 11.1  10.5  9.7   Hematocrit 39.0 - 52.0 % 34.5  30.5  28.3   Platelets 150 - 400 K/uL 384  405  364    CT Angio Chest PE W and/or Wo Contrast  Result Date: 05/22/2022 CLINICAL DATA:  PE suspected.  High probability EXAM: CT ANGIOGRAPHY CHEST WITH CONTRAST TECHNIQUE: Multidetector CT imaging of the chest was performed using the standard protocol during bolus administration of intravenous contrast. Multiplanar CT image reconstructions and MIPs were obtained to evaluate the vascular anatomy. RADIATION DOSE REDUCTION: This exam was performed according to the departmental dose-optimization program which includes automated exposure control, adjustment of the mA and/or kV according to patient size and/or use of iterative reconstruction technique. CONTRAST:  169m OMNIPAQUE IOHEXOL 350 MG/ML SOLN COMPARISON:  None Available. FINDINGS: Cardiovascular: Technique is suboptimal in the aorta is more dense than the pulmonary arteries. Repeat exam was performed with some  improvement. With this caveat, there are no filling defects within the pulmonary arteries to suggest acute pulmonary embolism. Coronary artery calcification and aortic atherosclerotic calcification. Mediastinum/Nodes: Moderate enlarged mediastinal lymph nodes. For example RIGHT lower paratracheal node measures 15 mm. Prevascular node measures 14 mm. No axillary adenopathy. Lungs/Pleura: Diffuse ground-glass densities throughout the lungs. There is interlobular septal thickening. Bilateral pleural effusions. Effusions are mild. Findings suggests a pulmonary edema pattern. Upper Abdomen: Limited view of the liver, kidneys, pancreas are unremarkable. Normal adrenal glands. Musculoskeletal: No aggressive osseous lesion. Review of the MIP images confirms the above findings. IMPRESSION: 1. No evidence of acute pulmonary embolism. 2. Diffuse ground-glass densities and interlobular septal thickening suggests pulmonary edema. 3. Bilateral small pleural effusions. 4. Mediastinal lymphadenopathy is favored reactive. 5. Aortic atherosclerosis. Aortic Atherosclerosis (ICD10-I70.0). Electronically Signed   By: SSuzy BouchardM.D.   On: 05/22/2022 14:21   DG Chest Portable 1 View  Result Date: 05/22/2022 CLINICAL DATA:  Dyspnea, shortness of breath. EXAM:  PORTABLE CHEST 1 VIEW COMPARISON:  Chest radiograph dated 03/18/2022. FINDINGS: The heart is enlarged. Moderate bilateral interstitial opacities are noted. There is a small right pleural effusion. No significant left pleural effusion. No pneumothorax. Degenerative changes are seen in the spine. IMPRESSION: Cardiomegaly with moderate bilateral interstitial opacities and small right pleural effusion. Findings may represent pulmonary edema or atypical infection. Electronically Signed   By: Zerita Boers M.D.   On: 05/22/2022 11:11    Review of Systems  Constitutional:  Negative for appetite change, chills, fatigue and fever.  HENT:  Negative for nosebleeds, sinus pressure,  sore throat and trouble swallowing.   Eyes:  Negative for photophobia and visual disturbance.  Respiratory:  Positive for shortness of breath. Negative for cough, chest tightness and wheezing.   Cardiovascular:  Negative for chest pain and leg swelling.  Gastrointestinal:  Negative for abdominal pain, diarrhea, nausea and vomiting.  Genitourinary:  Negative for difficulty urinating, dysuria, hematuria and urgency.  Musculoskeletal:  Negative for arthralgias, joint swelling and myalgias.  Skin:  Negative for pallor and wound.  Neurological:  Negative for dizziness, speech difficulty, weakness and light-headedness.   Blood pressure (!) 175/109, pulse 75, temperature 98 F (36.7 C), temperature source Oral, resp. rate 16, height 5' 7"$  (1.702 m), weight 105.2 kg, SpO2 94 %. Physical Exam Vitals and nursing note reviewed.  Constitutional:      General: He is not in acute distress.    Appearance: He is well-developed. He is obese. He is not ill-appearing or toxic-appearing.  HENT:     Head: Normocephalic and atraumatic.     Mouth/Throat:     Mouth: Mucous membranes are moist.     Pharynx: Oropharynx is clear.  Eyes:     Extraocular Movements: Extraocular movements intact.     Pupils: Pupils are equal, round, and reactive to light.  Neck:     Vascular: No JVD.  Cardiovascular:     Rate and Rhythm: Normal rate and regular rhythm.     Pulses: Normal pulses.     Heart sounds: No murmur heard. Pulmonary:     Effort: Pulmonary effort is normal.     Breath sounds: Examination of the right-lower field reveals rales. Examination of the left-lower field reveals rales. Rales present.  Abdominal:     General: Bowel sounds are normal.     Palpations: Abdomen is soft. There is no mass.  Musculoskeletal:        General: Normal range of motion.     Cervical back: Neck supple.     Right lower leg: Edema present.     Left lower leg: Edema present.     Comments: Trace edema over both ankles.  Left  upper arm AV fistula that is hyper pulsatile  Skin:    General: Skin is warm and dry.     Findings: No rash.  Neurological:     General: No focal deficit present.     Mental Status: He is alert and oriented to person, place, and time.     Assessment/Plan: 1.  Acute hypoxic respiratory failure: Secondary to volume overload/pulmonary edema seen on chest x-ray.  The plan is to undertake emergent hemodialysis for volume unloading and possibly revise his estimated dry weight.  We discussed sodium restriction and the need for limiting intradialytic weight gain. 2.  End-stage renal disease: Usually on a Monday/Wednesday/Friday dialysis schedule at [facility] with decent adherence.  Will undertake emergent dialysis today for volume unloading and revisit EDW. 3.  Hypertension: Significantly elevated blood pressures noted likely as a result of his volume status, monitor with ultrafiltration/hemodialysis. 4.  Anemia of chronic disease: Hemoglobin and hematocrit noted to be marginally low, reassess with ultrafiltration on hemodialysis for possible correction as volume status. 5.  Secondary hyperparathyroidism: Continue phosphorus binders and will restart his Cinacalcet and VDRA with dialysis.  Charlotta Lapaglia K. 05/22/2022, 3:19 PM

## 2022-05-22 NOTE — ED Notes (Signed)
Transfer of care report given to hemodialysis RN, they will give a rolling call.

## 2022-05-22 NOTE — ED Provider Notes (Signed)
[facility] Provider Note  CSN: [id number] Arrival date & time: 05/22/22 1021  Chief Complaint(s) Shortness of Breath  HPI [name] is a 57 y.o. male with PMH CHF, T2DM, HTN, ESRD on hemodialysis Monday Wednesday Friday with last dialysis session yesterday who presents emergency department for evaluation of shortness of breath and cough.  Patient states that since hospital discharge for COVID-19 with hypoxia in December 2024 he has had some mild shortness of breath but over the last few days his symptoms have significantly worsened.  Endorses lateral chest wall pain with coughing but denies hemoptysis.  Patient found to be hypoxic on room air to 88% and this was also seen here in the emergency department.  Patient maintaining oxygen saturations well on 2 L.   Past Medical History Past Medical History:  Diagnosis Date   Acute kidney injury ([place]) 12/28/2014   Adjustment disorder with mixed anxiety and depressed mood 12/28/2014   CHF (congestive heart failure) (HCC)    CPAP (continuous positive airway pressure) dependence    Diabetes mellitus without complication ([place])    DM (diabetes mellitus) type II controlled with renal manifestation ([place]) 12/28/2014   DM (diabetes mellitus) type II controlled, neurological manifestation ([place]) 12/28/2014   Encephalopathy, hypertensive 12/28/2014   GERD (gastroesophageal reflux disease)    Hypertension    Hypertensive emergency without congestive heart failure 12/27/2014   Pneumonia    Possible Panic disorder 12/28/2014   Renal disorder    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   Sleep apnea    Patient Active Problem List   Diagnosis Date Noted   Acute hypoxemic respiratory failure ([place]) 05/22/2022   (HFpEF) heart failure with preserved ejection fraction ([place]) 05/22/2022   Acute pulmonary edema ([place]) 03/19/2022   Fluid overload 03/18/2022   Tinea pedis of both feet 11/22/2020   Enlarged heart 02/02/2020    Hospital discharge follow-up 01/12/2020   ESRD (end stage renal disease) ([place]) 01/02/2020   Hypertensive urgency 10/25/2019   Chronic ulcer of right leg ([place]) 10/25/2019   Right otitis media with effusion 10/25/2019   AF (paroxysmal atrial fibrillation) ([place]) 10/25/2019   Bilateral lower extremity edema 10/06/2019   Eustachian tube dysfunction, right 10/06/2019   Chronic anticoagulation 12/11/2018   Elevated troponin 11/02/2018   Diabetic polyneuropathy associated with type 2 diabetes mellitus ([place]) 11/13/2017   Acute renal failure superimposed on chronic kidney disease, on chronic dialysis ([place]) 07/28/2017   Right sided Flank pain 07/27/2017   Weakness 07/27/2017   Myalgia 07/27/2017   Pneumonia of right lung due to infectious organism    Pleural effusion    Uncontrolled hypertension    Post-operative pain    Type 2 diabetes mellitus with hyperlipidemia (HCC)    Acute diastolic congestive heart failure (HCC)    Encephalopathy    Stage 4 chronic kidney disease ([place])    SIRS (systemic inflammatory response syndrome) ([place])    Chest tube in place    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[place])    Postoperative pain    Pleural effusion on right 05/17/2017   Empyema ([place]) 05/16/2017   Chronic depression 09/20/2015   Recurrent major depressive disorder, in partial remission ([place]) 09/20/2015   Microalbuminuria 01/20/2015   Acute kidney injury ([place]) 12/28/2014   Type 2 diabetes mellitus with hyperglycemia, with long-term current use of insulin ([place]) 12/28/2014   DM (diabetes mellitus) type II controlled, neurological manifestation ([place]) 12/28/2014   Adjustment disorder with mixed anxiety and depressed mood 12/28/2014   Possible Panic disorder 12/28/2014  Hypertensive emergency without congestive heart failure 12/27/2014   Anxiety 12/23/2014   Hyponatremia 06/27/2013   Essential hypertension, malignant 06/27/2013   Hyperglycemia 06/26/2013   DKA (diabetic ketoacidoses) 06/26/2013   Class 2 obesity  05/14/2012   Sleep apnea 05/14/2012   Severe uncontrolled hypertension 03/31/2011   Hypokalemia 03/28/2011   Essential hypertension 03/28/2011   CKD (chronic kidney disease), stage V ([place]) 03/28/2011   Home Medication(s) Prior to Admission medications   Medication Sig Start Date End Date Taking? Authorizing Provider  acetaminophen (TYLENOL) 500 MG tablet Take 1,000-1,500 mg by mouth 2 (two) times daily as needed (pain).   Yes [provider]  ADVAIR HFA 115-21 MCG/ACT inhaler Inhale 1 puff into the lungs 2 (two) times daily. 03/15/22  Yes [provider]  albuterol (PROVENTIL HFA;VENTOLIN HFA) 108 (90 BASE) MCG/ACT inhaler Inhale 2 puffs into the lungs every 6 (six) hours as needed for wheezing or shortness of breath.    Yes [provider]  amLODipine (NORVASC) 10 MG tablet Take 1 tablet (10 mg total) by mouth daily. 01/06/20  Yes Florencia Reasons, MD  benzonatate (TESSALON) 100 MG capsule Take 100 mg by mouth 3 (three) times daily as needed for cough. 03/15/22  Yes [provider]  carvedilol (COREG) 25 MG tablet Take 1 tablet (25 mg total) by mouth 2 (two) times daily with a meal. 01/06/20  Yes Florencia Reasons, MD  cetirizine (ZYRTEC) 10 MG tablet Take 10 mg by mouth daily. 03/15/22  Yes [provider]  gabapentin (NEURONTIN) 300 MG capsule Take 1 capsule (300 mg total) by mouth daily. Patient taking differently: Take 300 mg by mouth 2 (two) times daily. 10/28/19  Yes Georgette Shell, MD  insulin aspart protamine - aspart (NOVOLOG MIX 70/30 FLEXPEN) (70-30) 100 UNIT/ML FlexPen Inject 15-30 Units into the skin 2 (two) times daily.   Yes [provider]  multivitamin (RENA-VIT) TABS tablet Take 1 tablet by mouth at bedtime. 01/06/20  Yes Florencia Reasons, MD  pantoprazole (PROTONIX) 40 MG tablet Take 40 mg by mouth daily. 04/26/22  Yes [provider]  sevelamer (RENAGEL) 800 MG tablet Take 1,600 mg by mouth 3 (three) times daily with meals.   Yes  [provider]  sevelamer carbonate (RENVELA) 800 MG tablet Take 1,600-2,400 mg by mouth 3 (three) times daily with meals.   Yes [provider]  tiZANidine (ZANAFLEX) 4 MG tablet Take 4 mg by mouth every 8 (eight) hours as needed for muscle spasms.  04/08/17  Yes [provider]  Accu-Chek Softclix Lancets lancets Use as directed.  For insulin-dependent type 2 diabetes Monitor  glucose 3 times a day 01/06/20   Florencia Reasons, MD  calcitRIOL (ROCALTROL) 0.25 MCG capsule Take 1 capsule (0.25 mcg total) by mouth every Monday, Wednesday, and Friday. 01/08/20   Florencia Reasons, MD  glucose blood test strip Use as instructed For insulin-dependent type 2 diabetes Monitor  glucose 3 times a day 01/06/20   Florencia Reasons, MD  Vitamin D, Ergocalciferol, (DRISDOL) 1.25 MG (50000 UNIT) CAPS capsule Take 50,000 Units by mouth every Sunday. 08/17/19   [provider]  Past Surgical History Past Surgical History:  Procedure Laterality Date   AV FISTULA PLACEMENT Left 01/05/2020   Procedure: LEFT ARM BRACHIOCEPHALIC ARTERIOVENOUS (AV) FISTULA;  Surgeon: Angelia Mould, MD;  Location: [facility];  Service: Vascular;  Laterality: Left;   EMPYEMA DRAINAGE Right 05/17/2017   Procedure: EMPYEMA DRAINAGE AND DECORTICATION;  Surgeon: Grace Isaac, MD;  Location: [facility] OR;  Service: Thoracic;  Laterality: Right;   FLEXIBLE BRONCHOSCOPY  05/17/2017   Procedure: FLEXIBLE BRONCHOSCOPY;  Surgeon: Grace Isaac, MD;  Location: [facility];  Service: Thoracic;;   INSERTION OF DIALYSIS CATHETER Right 12/12/2021   Procedure: INSERTION OF RIGHT INTERNAL JUGULAR DIALYSIS CATHETER;  Surgeon: Angelia Mould, MD;  Location: [facility];  Service: Vascular;  Laterality: Right;   IR FLUORO GUIDE CV LINE RIGHT  01/01/2020   IR THORACENTESIS ASP PLEURAL SPACE W/IMG GUIDE  05/17/2017   IR US  GUIDE VASC ACCESS RIGHT  01/01/2020   NO PAST SURGERIES     REVISON OF ARTERIOVENOUS FISTULA Left 12/12/2021   Procedure: REVISION OF LEFT ARM FISTULA BY PLICATION;  Surgeon: Angelia Mould, MD;  Location: [facility] OR;  Service: Vascular;  Laterality: Left;   THORACOTOMY/LOBECTOMY Right 05/17/2017   Procedure: RIGHT MINI THORACOTOMY;  Surgeon: Grace Isaac, MD;  Location: [facility];  Service: Thoracic;  Laterality: Right;   VIDEO ASSISTED THORACOSCOPY (VATS)/EMPYEMA Right 05/17/2017   Procedure: RIGHT VIDEO ASSISTED THORACOSCOPY (VATS)/EMPYEMA;  Surgeon: Grace Isaac, MD;  Location: [facility];  Service: Thoracic;  Laterality: Right;   Family History Family History  Problem Relation Age of Onset   Hypertension Mother     Social History Social History   Tobacco Use   Smoking status: Never    Passive exposure: Never   Smokeless tobacco: Never  Vaping Use   Vaping Use: Never used  Substance Use Topics   Alcohol use: No   Drug use: No   Allergies Patient has no known allergies.  Review of Systems Review of Systems  Respiratory:  Positive for shortness of breath.   Cardiovascular:  Positive for chest pain.    Physical Exam Vital Signs  I have reviewed the triage vital signs BP (!) 172/110 (BP Location: Right Arm)   Pulse 77   Temp 97.9 F (36.6 C)   Resp 20   Ht 5' 7"$  (1.702 m)   Wt 105.2 kg   SpO2 94%   BMI 36.34 kg/m   Physical Exam Constitutional:      General: He is not in acute distress.    Appearance: Normal appearance.  HENT:     Head: Normocephalic and atraumatic.     Nose: No congestion or rhinorrhea.  Eyes:     General:        Right eye: No discharge.        Left eye: No discharge.     Extraocular Movements: Extraocular movements intact.     Pupils: Pupils are equal, round, and reactive to light.  Cardiovascular:     Rate and Rhythm: Normal rate and regular rhythm.     Heart sounds: No murmur heard. Pulmonary:     Effort: No respiratory distress.      Breath sounds: No wheezing or rales.  Abdominal:     General: There is no distension.     Tenderness: There is no abdominal tenderness.  Musculoskeletal:        General: Normal range of motion.     Cervical back: Normal range of motion.  Skin:  General: Skin is warm and dry.  Neurological:     General: No focal deficit present.     Mental Status: He is alert.     ED Results and Treatments Labs (all labs ordered are listed, but only abnormal results are displayed) Labs Reviewed  COMPREHENSIVE METABOLIC PANEL - Abnormal; Notable for the following components:      Result Value   Chloride 96 (*)    Glucose, Bld 120 (*)    BUN 30 (*)    Creatinine, Ser 8.58 (*)    Albumin 3.2 (*)    GFR, Estimated 7 (*)    All other components within normal limits  CBC WITH DIFFERENTIAL/PLATELET - Abnormal; Notable for the following components:   RBC 3.59 (*)    Hemoglobin 11.1 (*)    HCT 34.5 (*)    All other components within normal limits  BRAIN NATRIURETIC PEPTIDE - Abnormal; Notable for the following components:   B Natriuretic Peptide 450.2 (*)    All other components within normal limits  TROPONIN I (HIGH SENSITIVITY) - Abnormal; Notable for the following components:   Troponin I (High Sensitivity) 114 (*)    All other components within normal limits  TROPONIN I (HIGH SENSITIVITY) - Abnormal; Notable for the following components:   Troponin I (High Sensitivity) 103 (*)    All other components within normal limits  HEPATITIS B SURFACE ANTIGEN  HEPATITIS B SURFACE ANTIBODY, QUANTITATIVE  HEMOGLOBIN A1C  CBC  BASIC METABOLIC PANEL  MAGNESIUM  PHOSPHORUS                                                                                                                          Radiology CT Angio Chest PE W and/or Wo Contrast  Result Date: 05/22/2022 CLINICAL DATA:  PE suspected.  High probability EXAM: CT ANGIOGRAPHY CHEST WITH CONTRAST TECHNIQUE: Multidetector CT imaging of the  chest was performed using the standard protocol during bolus administration of intravenous contrast. Multiplanar CT image reconstructions and MIPs were obtained to evaluate the vascular anatomy. RADIATION DOSE REDUCTION: This exam was performed according to the departmental dose-optimization program which includes automated exposure control, adjustment of the mA and/or kV according to patient size and/or use of iterative reconstruction technique. CONTRAST:  13m OMNIPAQUE IOHEXOL 350 MG/ML SOLN COMPARISON:  None Available. FINDINGS: Cardiovascular: Technique is suboptimal in the aorta is more dense than the pulmonary arteries. Repeat exam was performed with some improvement. With this caveat, there are no filling defects within the pulmonary arteries to suggest acute pulmonary embolism. Coronary artery calcification and aortic atherosclerotic calcification. Mediastinum/Nodes: Moderate enlarged mediastinal lymph nodes. For example RIGHT lower paratracheal node measures 15 mm. Prevascular node measures 14 mm. No axillary adenopathy. Lungs/Pleura: Diffuse ground-glass densities throughout the lungs. There is interlobular septal thickening. Bilateral pleural effusions. Effusions are mild. Findings suggests a pulmonary edema pattern. Upper Abdomen: Limited view of the liver, kidneys, pancreas are unremarkable. Normal adrenal glands. Musculoskeletal: No aggressive osseous lesion. Review of  the MIP images confirms the above findings. IMPRESSION: 1. No evidence of acute pulmonary embolism. 2. Diffuse ground-glass densities and interlobular septal thickening suggests pulmonary edema. 3. Bilateral small pleural effusions. 4. Mediastinal lymphadenopathy is favored reactive. 5. Aortic atherosclerosis. Aortic Atherosclerosis (ICD10-I70.0). Electronically Signed   By: Suzy Bouchard M.D.   On: 05/22/2022 14:21   DG Chest Portable 1 View  Result Date: 05/22/2022 CLINICAL DATA:  Dyspnea, shortness of breath. EXAM: PORTABLE  CHEST 1 VIEW COMPARISON:  Chest radiograph dated 03/18/2022. FINDINGS: The heart is enlarged. Moderate bilateral interstitial opacities are noted. There is a small right pleural effusion. No significant left pleural effusion. No pneumothorax. Degenerative changes are seen in the spine. IMPRESSION: Cardiomegaly with moderate bilateral interstitial opacities and small right pleural effusion. Findings may represent pulmonary edema or atypical infection. Electronically Signed   By: Zerita Boers M.D.   On: 05/22/2022 11:11    Pertinent labs & imaging results that were available during my care of the patient were reviewed by me and considered in my medical decision making (see MDM for details).  Medications Ordered in ED Medications  nitroGLYCERIN (NITROSTAT) SL tablet 0.4 mg (0.4 mg Sublingual Given 05/22/22 1205)  Chlorhexidine Gluconate Cloth 2 % PADS 6 each (has no administration in time range)  heparin injection 4,200 Units (has no administration in time range)  heparin injection 5,000 Units (has no administration in time range)  mometasone-formoterol (DULERA) 200-5 MCG/ACT inhaler 2 puff (has no administration in time range)  albuterol (VENTOLIN HFA) 108 (90 Base) MCG/ACT inhaler 2 puff (has no administration in time range)  amLODipine (NORVASC) tablet 10 mg (has no administration in time range)  carvedilol (COREG) tablet 25 mg (has no administration in time range)  gabapentin (NEURONTIN) capsule 300 mg (has no administration in time range)  multivitamin (RENA-VIT) tablet 1 tablet (has no administration in time range)  pantoprazole (PROTONIX) EC tablet 40 mg (has no administration in time range)  sevelamer carbonate (RENVELA) tablet 1,600-2,400 mg (has no administration in time range)  Vitamin D (Ergocalciferol) (DRISDOL) 1.25 MG (50000 UNIT) capsule 50,000 Units (has no administration in time range)  insulin aspart (novoLOG) injection 0-6 Units (has no administration in time range)  iohexol  (OMNIPAQUE) 350 MG/ML injection 150 mL (150 mLs Intravenous Contrast Given 05/22/22 1406)  furosemide (LASIX) injection 60 mg (60 mg Intravenous Given 05/22/22 1453)                                                                                                                                     Procedures .Critical Care  Performed by: Teressa Lower, MD Authorized by: Teressa Lower, MD   Critical care provider statement:    Critical care time (minutes):  30   Critical care was necessary to treat or prevent imminent or life-threatening deterioration of the following conditions:  Respiratory failure   Critical care was time spent personally by me on the following  activities:  Development of treatment plan with patient or surrogate, discussions with consultants, evaluation of patient's response to treatment, examination of patient, ordering and review of laboratory studies, ordering and review of radiographic studies, ordering and performing treatments and interventions, pulse oximetry, re-evaluation of patient's condition and review of old charts   (including critical care time)  Medical Decision Making / ED Course   This patient presents to the ED for concern of shortness of breath, this involves an extensive number of treatment options, and is a complaint that carries with it a high risk of complications and morbidity.  The differential diagnosis includes CHF exacerbation, COPD exacerbation, fluid overload, PE, pneumonia  MDM: Patient seen emergency room for evaluation of shortness of breath and hypoxia.  Physical exam with tachypnea but is otherwise largely unremarkable.  Laboratory evaluation with a hemoglobin of 11.1, BUN 30, creatinine 8.58 consistent with his history of ESRD on hemodialysis.  BNP elevated to 450.2, initial high-sensitivity troponin 114 but downtrending on delta troponin to 103.  Chest x-ray with mild interstitial opacities consistent with infection versus pulmonary  edema.  Follow-up CT PE study obtained given his history of upper extremity DVT and new hypoxia here in the emergency department that was reassuringly negative for PE but does show evidence of pulmonary edema.  Patient does make a small amount of urine and thus Lasix therapy initiated.  I spoke with Dr. Posey Pronto of nephrology who will help arrange inpatient dialysis.  Patient currently with a persistent new oxygen requirement requiring 2 L nasal cannula and thus he will require hospitalization for fluid overload and new oxygen requirement.   Additional history obtained:  -External records from outside source obtained and reviewed including: Chart review including previous notes, labs, imaging, consultation notes   Lab Tests: -I ordered, reviewed, and interpreted labs.   The pertinent results include:   Labs Reviewed  COMPREHENSIVE METABOLIC PANEL - Abnormal; Notable for the following components:      Result Value   Chloride 96 (*)    Glucose, Bld 120 (*)    BUN 30 (*)    Creatinine, Ser 8.58 (*)    Albumin 3.2 (*)    GFR, Estimated 7 (*)    All other components within normal limits  CBC WITH DIFFERENTIAL/PLATELET - Abnormal; Notable for the following components:   RBC 3.59 (*)    Hemoglobin 11.1 (*)    HCT 34.5 (*)    All other components within normal limits  BRAIN NATRIURETIC PEPTIDE - Abnormal; Notable for the following components:   B Natriuretic Peptide 450.2 (*)    All other components within normal limits  TROPONIN I (HIGH SENSITIVITY) - Abnormal; Notable for the following components:   Troponin I (High Sensitivity) 114 (*)    All other components within normal limits  TROPONIN I (HIGH SENSITIVITY) - Abnormal; Notable for the following components:   Troponin I (High Sensitivity) 103 (*)    All other components within normal limits  HEPATITIS B SURFACE ANTIGEN  HEPATITIS B SURFACE ANTIBODY, QUANTITATIVE  HEMOGLOBIN A1C  CBC  BASIC METABOLIC PANEL  MAGNESIUM  PHOSPHORUS        Imaging Studies ordered: I ordered imaging studies including Chest x-ray, CT PE I independently visualized and interpreted imaging. I agree with the radiologist interpretation   Medicines ordered and prescription drug management: Meds ordered this encounter  Medications   nitroGLYCERIN (NITROSTAT) SL tablet 0.4 mg   iohexol (OMNIPAQUE) 350 MG/ML injection 150 mL   furosemide (LASIX) injection 60  mg   Chlorhexidine Gluconate Cloth 2 % PADS 6 each   heparin injection 4,200 Units   heparin injection 5,000 Units   mometasone-formoterol (DULERA) 200-5 MCG/ACT inhaler 2 puff   albuterol (VENTOLIN HFA) 108 (90 Base) MCG/ACT inhaler 2 puff   amLODipine (NORVASC) tablet 10 mg   carvedilol (COREG) tablet 25 mg   gabapentin (NEURONTIN) capsule 300 mg   multivitamin (RENA-VIT) tablet 1 tablet   pantoprazole (PROTONIX) EC tablet 40 mg   sevelamer carbonate (RENVELA) tablet 1,600-2,400 mg   Vitamin D (Ergocalciferol) (DRISDOL) 1.25 MG (50000 UNIT) capsule 50,000 Units   insulin aspart (novoLOG) injection 0-6 Units    Order Specific Question:   Correction coverage:    Answer:   Very Sensitive (ESRD/Dialysis)    Order Specific Question:   CBG < 70:    Answer:   Implement Hypoglycemia Standing Orders and refer to Hypoglycemia Standing Orders sidebar report    Order Specific Question:   CBG 70 - 120:    Answer:   0 units    Order Specific Question:   CBG 121 - 150:    Answer:   0 units    Order Specific Question:   CBG 151 - 200:    Answer:   1 unit    Order Specific Question:   CBG 201-250:    Answer:   2 units    Order Specific Question:   CBG 251-300:    Answer:   3 units    Order Specific Question:   CBG 301-350:    Answer:   4 units    Order Specific Question:   CBG 351-400:    Answer:   5 units    Order Specific Question:   CBG > 400    Answer:   Give 6 units and call MD    -I have reviewed the patients home medicines and have made adjustments as needed  Critical  interventions Supplemental oxygen  Consultations Obtained: I requested consultation with the nephrologist on-call Dr. Posey Pronto,  and discussed lab and imaging findings as well as pertinent plan - they recommend: Inpatient dialysis   Cardiac Monitoring: The patient was maintained on a cardiac monitor.  I personally viewed and interpreted the cardiac monitored which showed an underlying rhythm of: NSR  Social Determinants of Health:  Factors impacting patients care include: none   Reevaluation: After the interventions noted above, I reevaluated the patient and found that they have :improved  Co morbidities that complicate the patient evaluation  Past Medical History:  Diagnosis Date   Acute kidney injury ([place]) 12/28/2014   Adjustment disorder with mixed anxiety and depressed mood 12/28/2014   CHF (congestive heart failure) (HCC)    CPAP (continuous positive airway pressure) dependence    Diabetes mellitus without complication ([place])    DM (diabetes mellitus) type II controlled with renal manifestation ([place]) 12/28/2014   DM (diabetes mellitus) type II controlled, neurological manifestation ([place]) 12/28/2014   Encephalopathy, hypertensive 12/28/2014   GERD (gastroesophageal reflux disease)    Hypertension    Hypertensive emergency without congestive heart failure 12/27/2014   Pneumonia    Possible Panic disorder 12/28/2014   Renal disorder    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

## 2022-05-22 NOTE — ED Triage Notes (Signed)
Pt BIB [facility] from PCP. Pt reports worsening SOB for x2 day & left side CP. SOB worsened more after dialysis yesterday (05/21/2022). MWF schedule- pt reports compliance. Pt denied taking AM medications. Endorses productive cough with thick clear sputum, sharp pain in left chest. Dialysis reports clot in port- left side restriction. PCP gave 146m ASA. No meds with EMS. Pt currently on 2lpm [place], will desat to 88% on RA.

## 2022-05-22 NOTE — Hospital Course (Addendum)
[name] is a 57 y.o. male with pertinent PMH of  HFpEF, paroxysmal A-fib, T2DM, HTN, ESRD on HD M/W/F who presented with progressive dyspnea and cough for the past week and is admitted for acute hypoxemic respiratory failure secondary to pulmonary edema, on hospital day 2     Acute hypoxemic respiratory failure Pulmonary edema Patient presented with oxygen saturations of 88 on room air with significant pulmonary edema on imaging.  He was seen by nephrology in the ED and taken for emergent dialysis.  5 L was taken off during emergent dialysis and he was dialyzed again the next day on his regular schedule.  After that he was on room air without any issues.  Repeat chest x-ray showed some mild left basilar atelectasis but no other significant pulmonary edema.  It is overall difficult to tell whether his pulmonary edema is caused by his hypertension or his hypertension is caused by fluid overload but this has happened a few times over the past several months and he will need careful monitoring outpatient to ensure this does not continue to happen.     ESRD on HD M/W/F Patient is followed by [facility] and consistently goes to dialysis.  He is also on the kidney transplant list at [facility].  Recently his dry weight has been fluctuating.  Estimated dry weight here of 99.1 kg.  He will continue on his regular hemodialysis schedule after discharge.   Severe hypertension, resolved Elevated troponin Presented with blood pressure 201/106 on admission. Troponins were elevated at 114 but trended down to 103 and EKG did not show any signs of ischemia.  With fluid removal patient's blood pressure normalized and he was normotensive during exam today. Patient is noted to have multiple different prescriptions including some from [place] recently that include losartan, nifedipine, labetalol, including other GDMT like spironolactone in the past.  After talking with pharmacy he had not recall picking up  several of these prescriptions.  He responded pretty well to amlodipine 10 mg daily and carvedilol 25 mg twice daily.  He was discharged on these medications and we we will defer further antihypertensive changes to his outpatient nephrologist.   HFpEF Last echo readily available to [place] was in 2019 which showed EF of 60 to [id number], grade 1 diastolic dysfunction, and moderately dilated left atrium.   Patient follows with Dr. Harrell Gave.  Repeat echo here shows LVEF 50 to 55% with low normal LV function and severe asymmetric LVH of the basal septal segment with grade 1 diastolic dysfunction of the LV.  There is also mildly reduced RV systolic function and mild dilation of the ascending aorta measuring 38 mm.  Patient was continued on carvedilol 25 mg twice daily during admission.  Recommend to continue to follow-up with Dr. Harrell Gave.   Pleuritic chest pain with no evidence of pulmonary embolism on CTA of the chest Patient had pleuritic chest pain on the left side with elevated troponins as above and no pulmonary embolism on CTA of the chest.  He does have a history of chest trauma with resultant empyema that required VATS.  Chest pain improved during admission.   T2DM with neuropathy A1c 6.4. He is on insulin 70/30 and uses 15 units twice daily with some noted hypoglycemic symptoms at home.  He did not require much sliding scale insulin during his admission.  We discharged him on Humalog 70/30 with a plan to use 5-10 units if his blood sugars are high.  He was taking gabapentin 600 mg  twice daily when he came in which is well above the recommended maximum dose for ESRD patients.  We continued him on gabapentin 300 mg daily on discharge.   Paroxysmal A-fib Noted history of paroxysmal A-fib with an event few years ago of A-fib with RVR and had been on Eliquis without issue but was recently told to stop taking this by provider. CHA2DS2-VASc score of 3.  Recommended that he discuss resuming this medication with  his PCP and he was continued on carvedilol 25 mg twice daily.  Remained in normal sinus rhythm.   Possible COPD/asthma No documented history of the PFTs to support diagnosis of COPD or asthma but patient does have prescription for Advair and albuterol.  Pharmacy did clarify that he has not picked these up so we discontinued these on discharge.

## 2022-05-22 NOTE — Progress Notes (Signed)
Pre Hemodialysis Tx: Pt having SOB, denies chest pain at this time, comfortable on 2L [place], breathing expected to improve with TX, pt stable to begin tx.   05/22/22 1948  Vitals  Temp 97.9 F (36.6 C)  Pulse Rate 75  Resp 19  BP (!) 193/96  SpO2 94 %  O2 Device Nasal Cannula  Weight  (unable to obtain pt in stretcher)  Type of Weight Pre-Dialysis  Oxygen Therapy  O2 Flow Rate (L/min) 2 L/min  Pre Treatment  Is pt a NEW START this admission?  No  Vascular access used during treatment Fistula  Patient is receiving dialysis in a chair No  Hemodialysis Consent Verified Yes  Hemodialysis Standing Orders Initiated Yes  ECG (Telemetry) Monitor On Yes  Prime Ordered Normal Saline  Length of  DialysisTreatment -hour(s) 4 Hour(s)  Dialysis mode HD  Dialyzer Revaclear 400  Dialysate 3K  Dialysis Anticoagulation Heparin bolus  Dialysate Flow Ordered 300  Blood Flow Rate Ordered 400 mL/min  Ultrafiltration Goal 5000 Liters  Dialysis Blood Pressure Support Ordered Normal Saline

## 2022-05-22 NOTE — ED Notes (Signed)
5W and HD notified about pt's ready bed and his transfer to HD.

## 2022-05-22 NOTE — ED Notes (Signed)
ED TO INPATIENT HANDOFF REPORT  ED Nurse Name and Phone #: Duanne Guess RN [phone]  S Name/Age/Gender [name] 57 y.o. male Room/Bed: 028C/028C  Code Status   Code Status: Prior  Home/SNF/Other Home Patient oriented to: self, place, time, and situation Is this baseline? Yes   Triage Complete: Triage complete  Chief Complaint Acute hypoxemic respiratory failure ([place]) [J96.01]  Triage Note Pt BIB [facility] from PCP. Pt reports worsening SOB for x2 day & left side CP. SOB worsened more after dialysis yesterday (05/21/2022). MWF schedule- pt reports compliance. Pt denied taking AM medications. Endorses productive cough with thick clear sputum, sharp pain in left chest. Dialysis reports clot in port- left side restriction. PCP gave 113m ASA. No meds with EMS. Pt currently on 2lpm [place], will desat to 88% on RA.     Allergies No Known Allergies  Level of Care/Admitting Diagnosis ED Disposition     ED Disposition  Admit   Condition  --   Comment  [id number]  Level of Care: Progressive [102]  Admit to Progressive based on following criteria: NEPHROLOGY stable condition requiring close monitoring for AKI, requiring Hemodialysis or Peritoneal Dialysis either from expected electrolyte imbalance, acidosis, or fluid overload that can be managed by NIPPV or high flow oxygen.  [facility] to [facility] level of care is available:: Yes  Covid Evaluation: Asymptomatic - no recent exposure (last 10 days) testing not required  Diagnosis: Acute hypoxemic respiratory failure ([facility] [[id number] Admitting Physician: WAngelica Pou[[place] Attending Physician: WAngelica Pou[[id number] Certification:: I certify this patient will need inpatient services for at least 2 midnights  Estimated Length of Stay: 2          B Medical/Surgery History Past Medical History:  Diagnosis Date   Acute kidney injury  ([facility] 12/28/2014   Adjustment disorder with mixed anxiety and depressed mood 12/28/2014   CHF (congestive heart failure) ([facility]    CPAP (continuous positive airway pressure) dependence    Diabetes mellitus without complication ([facility]    DM (diabetes mellitus) type II controlled with renal manifestation ([facility] 12/28/2014   DM (diabetes mellitus) type II controlled, neurological manifestation ([facility] 12/28/2014   Encephalopathy, hypertensive 12/28/2014   GERD (gastroesophageal reflux disease)    Hypertension    Hypertensive emergency without congestive heart failure 12/27/2014   Pneumonia    Possible Panic disorder 12/28/2014   Renal disorder    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   Sleep apnea    Past Surgical History:  Procedure Laterality Date   AV FISTULA PLACEMENT Left 01/05/2020   Procedure: LEFT ARM BRACHIOCEPHALIC ARTERIOVENOUS (AV) FISTULA;  Surgeon: DAngelia Mould MD;  [facility]  Service: Vascular;  Laterality: Left;   EMPYEMA DRAINAGE Right 05/17/2017   Procedure: EMPYEMA DRAINAGE AND DECORTICATION;  Surgeon: GGrace Isaac MD;  [facility]  Service: Thoracic;  Laterality: Right;   FLEXIBLE BRONCHOSCOPY  05/17/2017   Procedure: FLEXIBLE BRONCHOSCOPY;  Surgeon: GGrace Isaac MD;  Location: [facility]  Service: Thoracic;;   INSERTION OF DIALYSIS CATHETER Right 12/12/2021   Procedure: INSERTION OF RIGHT INTERNAL JUGULAR DIALYSIS CATHETER;  Surgeon: DAngelia Mould MD;  [facility];  Service: Vascular;  Laterality: Right;   IR FLUORO GUIDE CV LINE RIGHT  01/01/2020   IR THORACENTESIS ASP PLEURAL SPACE W/IMG GUIDE  05/17/2017   IR [place] VASC ACCESS RIGHT  01/01/2020   NO PAST SURGERIES  REVISON OF ARTERIOVENOUS FISTULA Left 12/12/2021   Procedure: REVISION OF LEFT ARM FISTULA BY PLICATION;  Surgeon: Angelia Mould, MD;  Location: [place];  Service: Vascular;  Laterality: Left;   THORACOTOMY/LOBECTOMY Right 05/17/2017   Procedure: RIGHT MINI  THORACOTOMY;  Surgeon: Grace Isaac, MD;  Location: [place];  Service: Thoracic;  Laterality: Right;   VIDEO ASSISTED THORACOSCOPY (VATS)/EMPYEMA Right 05/17/2017   Procedure: RIGHT VIDEO ASSISTED THORACOSCOPY (VATS)/EMPYEMA;  Surgeon: Grace Isaac, MD;  Location: [place];  Service: Thoracic;  Laterality: Right;     A IV Location/Drains/Wounds Patient Lines/Drains/Airways Status     Active Line/Drains/Airways     Name Placement date Placement time Site Days   Peripheral IV 05/22/22 18 G Right Antecubital 05/22/22  1159  Antecubital  less than 1   Fistula / Graft Left Upper arm Arteriovenous fistula 12/12/21  0941  Upper arm  161   Y Chest Tube 1 and 2 05/17/17  1640  -- 1831            Intake/Output Last 24 hours No intake or output data in the 24 hours ending 05/22/22 1727  Labs/Imaging Results for orders placed or performed during the hospital encounter of 05/22/22 (from the past 48 hour(s))  Comprehensive metabolic panel     Status: Abnormal   Collection Time: 05/22/22 10:56 AM  Result Value Ref Range   Sodium 137 135 - 145 mmol/L   Potassium 3.5 3.5 - 5.1 mmol/L   Chloride 96 (L) 98 - 111 mmol/L   CO2 28 22 - 32 mmol/L   Glucose, Bld 120 (H) 70 - 99 mg/dL    Comment: Glucose reference range applies only to samples taken after fasting for at least 8 hours.   BUN 30 (H) 6 - 20 mg/dL   Creatinine, Ser 8.58 (H) 0.61 - 1.24 mg/dL   Calcium 9.9 8.9 - 10.3 mg/dL   Total Protein 6.9 6.5 - 8.1 g/dL   Albumin 3.2 (L) 3.5 - 5.0 g/dL   AST 15 15 - 41 U/L   ALT 9 0 - 44 U/L   Alkaline Phosphatase 62 38 - 126 U/L   Total Bilirubin 0.3 0.3 - 1.2 mg/dL   GFR, Estimated 7 (L) >60 mL/min    Comment: (NOTE) Calculated using the CKD-EPI Creatinine Equation (2021)    Anion gap 13 5 - 15    Comment: Performed at [facility] [address] [place], [place] [address]  Troponin I (High Sensitivity)     Status: Abnormal   Collection Time: 05/22/22 10:56 AM  Result Value  Ref Range   Troponin I (High Sensitivity) 114 (HH) <18 ng/L    Comment: CRITICAL RESULT CALLED TO, READ BACK BY AND VERIFIED WITH C.Mariadelaluz [place]ug[place],RN 1147 05/22/22 CLARK,S (NOTE) Elevated high sensitivity troponin I (hsTnI) values and significant  changes across serial measurements may suggest ACS but many other  chronic and acute conditions are known to elevate hsTnI results.  Refer to the "Links" section for chest pain algorithms and additional  guidance. Performed at [facility], [place] [address], [place], [place] [address]   CBC with Differential     Status: Abnormal   Collection Time: 05/22/22 10:56 AM  Result Value Ref Range   WBC 8.9 4.0 - 10.5 K/uL   RBC 3.59 (L) 4.22 - 5.81 MIL/uL   Hemoglobin 11.1 (L) 13.0 - 17.0 g/dL   HCT 34.5 (L) 39.0 - 52.0 %   MCV 96.1 80.0 - 100.0  fL   MCH 30.9 26.0 - 34.0 pg   MCHC 32.2 30.0 - 36.0 g/dL   RDW 14.2 11.5 - 15.5 %   Platelets 384 150 - 400 K/uL   nRBC 0.0 0.0 - 0.2 %   Neutrophils Relative % 60 %   Neutro Abs 5.4 1.7 - 7.7 K/uL   Lymphocytes Relative 29 %   Lymphs Abs 2.6 0.7 - 4.0 K/uL   Monocytes Relative 7 %   Monocytes Absolute 0.6 0.1 - 1.0 K/uL   Eosinophils Relative 3 %   Eosinophils Absolute 0.2 0.0 - 0.5 K/uL   Basophils Relative 1 %   Basophils Absolute 0.1 0.0 - 0.1 K/uL   Immature Granulocytes 0 %   Abs Immature Granulocytes 0.03 0.00 - 0.07 K/uL    Comment: Performed at [facility] [address], [place], [place] [address]  Brain natriuretic peptide     Status: Abnormal   Collection Time: 05/22/22 10:56 AM  Result Value Ref Range   B Natriuretic Peptide 450.2 (H) 0.0 - 100.0 pg/mL    Comment: Performed at [facility] [address] [place], [place] [address]  Troponin I (High Sensitivity)     Status: Abnormal   Collection Time: 05/22/22  1:18 PM  Result Value Ref Range   Troponin I (High Sensitivity) 103 (HH) <18 ng/L    Comment: CRITICAL VALUE NOTED. VALUE IS CONSISTENT WITH PREVIOUSLY  REPORTED/CALLED VALUE (NOTE) Elevated high sensitivity troponin I (hsTnI) values and significant  changes across serial measurements may suggest ACS but many other  chronic and acute conditions are known to elevate hsTnI results.  Refer to the "Links" section for chest pain algorithms and additional  guidance. Performed at [facility], [place] [address] [place],  [address]    CT Angio Chest PE W and/or Wo Contrast  Result Date: 05/22/2022 CLINICAL DATA:  PE suspected.  High probability EXAM: CT ANGIOGRAPHY CHEST WITH CONTRAST TECHNIQUE: Multidetector CT imaging of the chest was performed using the standard protocol during bolus administration of intravenous contrast. Multiplanar CT image reconstructions and MIPs were obtained to evaluate the vascular anatomy. RADIATION DOSE REDUCTION: This exam was performed according to the departmental dose-optimization program which includes automated exposure control, adjustment of the mA and/or kV according to patient size and/or use of iterative reconstruction technique. CONTRAST:  151m OMNIPAQUE IOHEXOL 350 MG/ML SOLN COMPARISON:  None Available. FINDINGS: Cardiovascular: Technique is suboptimal in the aorta is more dense than the pulmonary arteries. Repeat exam was performed with some improvement. With this caveat, there are no filling defects within the pulmonary arteries to suggest acute pulmonary embolism. Coronary artery calcification and aortic atherosclerotic calcification. Mediastinum/Nodes: Moderate enlarged mediastinal lymph nodes. For example RIGHT lower paratracheal node measures 15 mm. Prevascular node measures 14 mm. No axillary adenopathy. Lungs/Pleura: Diffuse ground-glass densities throughout the lungs. There is interlobular septal thickening. Bilateral pleural effusions. Effusions are mild. Findings suggests a pulmonary edema pattern. Upper Abdomen: Limited view of the liver, kidneys, pancreas are unremarkable. Normal adrenal  glands. Musculoskeletal: No aggressive osseous lesion. Review of the MIP images confirms the above findings. IMPRESSION: 1. No evidence of acute pulmonary embolism. 2. Diffuse ground-glass densities and interlobular septal thickening suggests pulmonary edema. 3. Bilateral small pleural effusions. 4. Mediastinal lymphadenopathy is favored reactive. 5. Aortic atherosclerosis. Aortic Atherosclerosis (ICD10-I70.0). Electronically Signed   By: SSuzy BouchardM.D.   On: 05/22/2022 14:21   DG Chest Portable 1 View  Result Date: 05/22/2022 CLINICAL DATA:  Dyspnea, shortness of breath. EXAM: PORTABLE CHEST 1 VIEW COMPARISON:  Chest radiograph dated 03/18/2022. FINDINGS: The heart is enlarged. Moderate bilateral interstitial opacities are noted. There is a small right pleural effusion. No significant left pleural effusion. No pneumothorax. Degenerative changes are seen in the spine. IMPRESSION: Cardiomegaly with moderate bilateral interstitial opacities and small right pleural effusion. Findings may represent pulmonary edema or atypical infection. Electronically Signed   By: Zerita Boers M.D.   On: 05/22/2022 11:11    Pending Labs Unresulted Labs (From admission, onward)     Start     Ordered   05/22/22 1506  Hepatitis B surface antigen  (New Admission Hemo Labs (Hepatitis B))  Once,   URGENT        05/22/22 1506   05/22/22 1506  Hepatitis B surface antibody,quantitative  (New Admission Hemo Labs (Hepatitis B))  Once,   URGENT        05/22/22 1506            Vitals/Pain Today's Vitals   05/22/22 1316 05/22/22 1330 05/22/22 1500 05/22/22 1704  BP: (!) 175/109 (!) 190/98 (!) 207/103   Pulse: 75 76 75   Resp: 16 18 (!) 23   Temp: 98 F (36.7 C)   97.6 F (36.4 C)  TempSrc: Oral   Oral  SpO2: 94% 97% 93%   Weight:      Height:      PainSc:        Isolation Precautions No active isolations  Medications Medications  nitroGLYCERIN (NITROSTAT) SL tablet 0.4 mg (0.4 mg Sublingual Given  05/22/22 1205)  Chlorhexidine Gluconate Cloth 2 % PADS 6 each (has no administration in time range)  iohexol (OMNIPAQUE) 350 MG/ML injection 150 mL (150 mLs Intravenous Contrast Given 05/22/22 1406)  furosemide (LASIX) injection 60 mg (60 mg Intravenous Given 05/22/22 1453)    Mobility walks     Focused Assessments Renal Assessment Handoff:  Hemodialysis Schedule: Hemodialysis Schedule: Monday/Wednesday/Friday Last Hemodialysis date and time: 05/21/2022   Restricted appendage: left arm   R Recommendations: See Admitting Provider Note  Report given to:   Additional Notes: Emergent dialysis likely to happen after 6pm per dialysis unit

## 2022-05-22 NOTE — ED Notes (Signed)
This RN spoke with dialysis, most likely to be done after 1800.

## 2022-05-22 NOTE — ED Notes (Signed)
Transport here to take pt up to HD.

## 2022-05-22 NOTE — H&P (Cosign Needed Addendum)
Date: 05/22/2022               Patient Name:  [name] MRN: [id number]  DOB: [birth year] Age / Sex: 57 y.o., male   PCP: Bartholome Bill, MD         Medical Service: Internal Medicine Teaching Service         Attending Physician: Dr. Jimmye Norman Elaina Pattee, MD      First Contact: Dr. Johny Blamer, DO Pager [phone]    Second Contact: Dr. Delene Ruffini, MD Pager [phone]         After Hours (After 5p/  First Contact Pager: [phone]  weekends / holidays): Second Contact Pager: [phone]   SUBJECTIVE   Chief Complaint: Dyspnea  History of Present Illness:  [name] is a 56 year old male with past medical history of HFpEF, paroxysmal A-fib, T2DM, HTN, ESRD on HD M/W/F who presents with progressive shortness of breath and cough for the past week.  He has had similar episodes a few times over the last 3 months that required hospitalization.  He states that about a week ago he started to feel more short of breath and was coughing up foamy clear/white sputum.  He also notes some mild left-sided chest wall pain associated with the coughing and foamy urine.  Otherwise he denies any associated symptoms including fever, chills, chest pain, sore throat, nausea, vomiting, dysuria, hematuria, constipation, diarrhea, or lower extremity edema.  He has not missed any recent dialysis sessions including dialysis yesterday.  He does still make a small amount of urine, about 12 ounces a day.  He does note that a few times they have been shorter than usual but not significantly.  He is currently on the kidney transplant list through [facility] and follows with [place] [facility].  He has had his phosphate binder changed recently and has Renvela on his medication list but otherwise denies any new or stopped medications.  He does not wear oxygen at home.  ED Course: Was seen with above complaints and was found to be hypoxemic with saturations of 88% on room air with good response on 2 L nasal cannula.   He was afebrile, hypertensive with systolics over [id number], no leukocytosis no severe electrolyte derangements, BNP elevated at 450 which is lower than prior values, troponin 114 trended to 103, chest x-ray showed diffuse pulmonary edema which was confirmed on CTA of the chest which did not show any evidence of pulmonary embolism.  EKG showed that he was in sinus rhythm without signs of ischemia.  Nephrology was consulted as well as internal medicine and patient was given nitro to help with the pulmonary edema as well as his high blood pressure.  Also given a dose of Lasix 60 mg in the ED.  Meds:  Advair Albuterol Amlodipine 10 mg daily Benzonatate Calcitriol Carvedilol 25 mg twice daily Cetirizine Gabapentin 600 mg twice daily Insulin 70/30 15-30 units twice daily (usually 15 units with feeling of low blood sugars at home) Pantoprazole 40 mg daily Renvela 1600 mg 3 times daily Tizanidine Vitamin D  Previously was on Eliquis but was recently told to stop taking this  Past Medical History Past Medical History:  Diagnosis Date   Acute kidney injury ([place]) 12/28/2014   Adjustment disorder with mixed anxiety and depressed mood 12/28/2014   CHF (congestive heart failure) (HCC)    CPAP (continuous positive airway pressure) dependence    Diabetes mellitus without complication ([place])    DM (diabetes mellitus) type II controlled with  renal manifestation ([place]) 12/28/2014   DM (diabetes mellitus) type II controlled, neurological manifestation ([place]) 12/28/2014   Encephalopathy, hypertensive 12/28/2014   GERD (gastroesophageal reflux disease)    Hypertension    Hypertensive emergency without congestive heart failure 12/27/2014   Pneumonia    Possible Panic disorder 12/28/2014   Renal disorder    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   Sleep apnea      Past Surgical History:  Procedure Laterality Date   AV FISTULA PLACEMENT Left 01/05/2020   Procedure: LEFT ARM BRACHIOCEPHALIC ARTERIOVENOUS (AV)  FISTULA;  Surgeon: Angelia Mould, MD;  Location: [place];  Service: Vascular;  Laterality: Left;   EMPYEMA DRAINAGE Right 05/17/2017   Procedure: EMPYEMA DRAINAGE AND DECORTICATION;  Surgeon: Grace Isaac, MD;  Location: [facility];  Service: Thoracic;  Laterality: Right;   FLEXIBLE BRONCHOSCOPY  05/17/2017   Procedure: FLEXIBLE BRONCHOSCOPY;  Surgeon: Grace Isaac, MD;  Location: [place];  Service: Thoracic;;   INSERTION OF DIALYSIS CATHETER Right 12/12/2021   Procedure: INSERTION OF RIGHT INTERNAL JUGULAR DIALYSIS CATHETER;  Surgeon: Angelia Mould, MD;  Location: [place];  Service: Vascular;  Laterality: Right;   IR FLUORO GUIDE CV LINE RIGHT  01/01/2020   IR THORACENTESIS ASP PLEURAL SPACE W/IMG GUIDE  05/17/2017   IR US GUIDE VASC ACCESS RIGHT  01/01/2020   NO PAST SURGERIES     REVISON OF ARTERIOVENOUS FISTULA Left 12/12/2021   Procedure: REVISION OF LEFT ARM FISTULA BY PLICATION;  Surgeon: Angelia Mould, MD;  Location: [place];  Service: Vascular;  Laterality: Left;   THORACOTOMY/LOBECTOMY Right 05/17/2017   Procedure: RIGHT MINI THORACOTOMY;  Surgeon: Grace Isaac, MD;  Location: [place];  Service: Thoracic;  Laterality: Right;   VIDEO ASSISTED THORACOSCOPY (VATS)/EMPYEMA Right 05/17/2017   Procedure: RIGHT VIDEO ASSISTED THORACOSCOPY (VATS)/EMPYEMA;  Surgeon: Grace Isaac, MD;  Location: [facility] OR;  Service: Thoracic;  Laterality: Right;    Social:  Level of Function: Independent in IADLs and ADLs PCP: Could not remember name Substances: Denies prior current use of tobacco, alcohol, or drugs.  Family History:  Family History  Problem Relation Age of Onset   Hypertension Mother      Allergies: Allergies as of 05/22/2022   (No Known Allergies)    Review of Systems: A complete ROS was negative except as per HPI.   OBJECTIVE:   Physical Exam: Blood pressure (!) 175/109, pulse 75, temperature 98 F (36.7 C), temperature source Oral, resp. rate 16, height  5' 7"$  (1.702 m), weight 105.2 kg, SpO2 94 %.  Constitutional: Middle-age male who appears uncomfortable. In no acute distress. HENT: Normocephalic, atraumatic,  Eyes: Sclera non-icteric, EOM intact Cardio:Regular rate and rhythm. 2+ bilateral radial and dorsalis pedis  pulses. Pulm: Diminished lung sounds predominantly in the bases bilaterally with crackles.  Abdomen: Soft, non-tender, non-distended, positive bowel sounds. [id number] for extremity edema. Skin:Warm and dry. Neuro:Alert and oriented x3. No focal deficit noted. Psych:Pleasant mood and affect.  Labs: CBC    Component Value Date/Time   WBC 8.9 05/22/2022 1056   RBC 3.59 (L) 05/22/2022 1056   HGB 11.1 (L) 05/22/2022 1056   HCT 34.5 (L) 05/22/2022 1056   PLT 384 05/22/2022 1056   MCV 96.1 05/22/2022 1056   MCH 30.9 05/22/2022 1056   MCHC 32.2 05/22/2022 1056   RDW 14.2 05/22/2022 1056   LYMPHSABS 2.6 05/22/2022 1056   MONOABS 0.6 05/22/2022 1056   EOSABS 0.2 05/22/2022 1056   BASOSABS 0.1 05/22/2022 1056  CMP     Component Value Date/Time   NA 137 05/22/2022 1056   K 3.5 05/22/2022 1056   CL 96 (L) 05/22/2022 1056   CO2 28 05/22/2022 1056   GLUCOSE 120 (H) 05/22/2022 1056   BUN 30 (H) 05/22/2022 1056   CREATININE 8.58 (H) 05/22/2022 1056   CALCIUM 9.9 05/22/2022 1056   PROT 6.9 05/22/2022 1056   ALBUMIN 3.2 (L) 05/22/2022 1056   AST 15 05/22/2022 1056   ALT 9 05/22/2022 1056   ALKPHOS 62 05/22/2022 1056   BILITOT 0.3 05/22/2022 1056   GFRNONAA 7 (L) 05/22/2022 1056   GFRAA 13 (L) 01/07/2020 0130    Imaging: CT Angio Chest PE W and/or Wo Contrast Result Date: 05/22/2022 IMPRESSION: 1. No evidence of acute pulmonary embolism. 2. Diffuse ground-glass densities and interlobular septal thickening suggests pulmonary edema. 3. Bilateral small pleural effusions. 4. Mediastinal lymphadenopathy is favored reactive. 5. Aortic atherosclerosis. Aortic Atherosclerosis (ICD10-I70.0). Electronically Signed   By:  Suzy Bouchard M.D.   On: 05/22/2022 14:21   DG Chest Portable 1 View Result Date: 05/22/2022 IMPRESSION: Cardiomegaly with moderate bilateral interstitial opacities and small right pleural effusion. Findings may represent pulmonary edema or atypical infection. Electronically Signed   By: Zerita Boers M.D.   On: 05/22/2022 11:11     EKG: personally reviewed my interpretation is normal sinus rhythm with prolonged QT of 483 ms. Prior EKG consistent on 03/18/2022  ASSESSMENT & PLAN:   Assessment & Plan by Problem: Principal Problem:   Acute hypoxemic respiratory failure (HCC) Active Problems:   Severe uncontrolled hypertension   Type 2 diabetes mellitus with hyperglycemia, with long-term current use of insulin (HCC)   Pleural effusion   Hypertensive urgency   AF (paroxysmal atrial fibrillation) (HCC)   ESRD (end stage renal disease) (HCC)   Diabetic polyneuropathy associated with type 2 diabetes mellitus (HCC)   Elevated troponin   Acute pulmonary edema (HCC)   (HFpEF) heart failure with preserved ejection fraction (HCC)   [name] is a 57 y.o. male with pertinent PMH of  HFpEF, paroxysmal A-fib, T2DM, HTN, ESRD on HD M/W/F who presented with progressive dyspnea and cough for the past week and is admitted for acute hypoxemic respiratory failure secondary to pulmonary edema.  Acute hypoxemic respiratory failure Pulmonary edema Patient has had at least 2 admissions recently for very similar issues.  He has been treated with fluid removal by dialysis and ultrafiltration with benefit.  Last admission in 03/2022 he was net negative nearly 5 L. He is satting well on 2 L nasal cannula and will go for emergent dialysis.  He was given nitroglycerin sublingual in the ED but did not require nitro drip.  He also has a history of empyema after trauma that required VATS procedure.  He does have small pleural effusions here but no signs of acute infection at this time. - Emergent dialysis for  volume removal - Supplemental O2 as needed with target 92+ - Strict ins and outs  ESRD on HD M/W/F Patient is followed by [place] [facility] and consistently attends dialysis.  As above he has had issues with acute pulmonary edema and hypoxemic respiratory failure over the last few months.  He is also on the kidney transplant list at [facility].  Nephrology was consulted in the ED, recently his EDW has been fluctuating and plan is to nail down his EDW during this admission. - Hemodialysis per nephrology  Severe hypertension Elevated troponin Presented with blood pressure 201/106 and received  sublingual nitroglycerin in the ED with some benefit.  Blood pressure most recently 179/90.  Troponins were elevated at 114 but trended down to 103 and EKG did not show any signs of ischemia.  Blood pressure mostl likely elevated in the setting of fluid overload with demand ischemia in the setting of fluid overload on end-stage renal disease. Update patient is noted to have multiple different prescriptions including some from [place] recently that include losartan, nifedipine, labetalol, including other GDMT like spironolactone in the past.  We will try to clarify this today along with nephrology. - Restart home medications of amlodipine 10 mg daily and carvedilol 25 mg twice daily - Will defer further antihypertensive decisions to nephrology but we are happy to help if needed - Cardiac monitoring  HFpEF Patient presents in fluid overload with history of HFpEF with GDMT limited by renal disease.  Last echo readily available to [place] was in 2019 which showed EF of 60 to 123456, grade 1 diastolic dysfunction, and moderately dilated left atrium.  BNP here of 450 which is slightly lower than prior admission for fluid overload.  Will remove fluid with HD as above.  Patient follows with Dr. Harrell Gave. - Continue carvedilol 25 mg twice daily  Pleuritic chest pain with no evidence of pulmonary embolism on CTA of the  chest Patient has pleuritic chest pain on the left side with elevated troponins as above and no pulmonary embolism on CTA of the chest.  He does have a history of chest trauma with resultant empyema that required VATS.  We will further characterize this today to see if this is a chronic issue since then or new and secondary to his cough or a symptom of his pulmonary edema and or pleural effusions.  No evidence of lung parenchyma infection and low suspicion for parapneumonic effusion or infected pleural effusion.  T2DM with neuropathy Last A1c available from 08/2021 10.5.  He is on insulin 70/30 and uses 15 units twice daily with some noted symptoms of low blood sugar.  We will evaluate this further as we watch his sugars during his admission. - SSI - Gabapentin 300 mg daily  Paroxysmal A-fib Noted history of paroxysmal A-fib with an event few years ago of A-fib with RVR and had been on Eliquis without issue but was recently told to stop taking this by provider, whose name he cannot recall, nor reason for cessation.  He has a normal sinus rhythm here and we will continue his carvedilol as above.  CHA2DS2-VASc score of 3.  We will dive deeper into this tomorrow and unless he has had a good reason to stop taking it we will restart his Eliquis. - Carvedilol 25 mg twice daily  Possible COPD/asthma No documented history of the PFTs to support diagnosis of COPD or asthma but patient does take Advair and albuterol inhaler at home.  We will continue these during admission and will attempt to clarify his pulmonary diagnosis prior to discharge.  Diet: Renal VTE: Heparin IVF: None, Code: Full  Dispo: Admit patient to Inpatient with expected length of stay greater than 2 midnights.  Signed: Johny Blamer, DO Internal Medicine Resident PGY-1  05/22/2022, 7:17 PM   Dr. Johny Blamer, DO Pager [phone]

## 2022-05-23 ENCOUNTER — Inpatient Hospital Stay (HOSPITAL_COMMUNITY): Payer: Medicare Other

## 2022-05-23 LAB — BASIC METABOLIC PANEL
Anion gap: 13 (ref 5–15)
BUN: 17 mg/dL (ref 6–20)
CO2: 28 mmol/L (ref 22–32)
Calcium: 9.8 mg/dL (ref 8.9–10.3)
Chloride: 91 mmol/L — ABNORMAL LOW (ref 98–111)
Creatinine, Ser: 5.79 mg/dL — ABNORMAL HIGH (ref 0.61–1.24)
GFR, Estimated: 11 mL/min — ABNORMAL LOW (ref >60)
Glucose, Bld: 154 mg/dL — ABNORMAL HIGH (ref 70–99)
Potassium: 3.3 mmol/L — ABNORMAL LOW (ref 3.5–5.1)
Sodium: 132 mmol/L — ABNORMAL LOW (ref 135–145)

## 2022-05-23 LAB — HEMOGLOBIN A1C
Hgb A1c MFr Bld: 6.4 % — ABNORMAL HIGH (ref 4.8–5.6)
Mean Plasma Glucose: 136.98 mg/dL

## 2022-05-23 LAB — CBC
HCT: 36.1 % — ABNORMAL LOW (ref 39.0–52.0)
Hemoglobin: 12.1 g/dL — ABNORMAL LOW (ref 13.0–17.0)
MCH: 30.9 pg (ref 26.0–34.0)
MCHC: 33.5 g/dL (ref 30.0–36.0)
MCV: 92.3 fL (ref 80.0–100.0)
Platelets: 386 10*3/uL (ref 150–400)
RBC: 3.91 MIL/uL — ABNORMAL LOW (ref 4.22–5.81)
RDW: 14.2 % (ref 11.5–15.5)
WBC: 8.6 10*3/uL (ref 4.0–10.5)
nRBC: 0 % (ref 0.0–0.2)

## 2022-05-23 LAB — GLUCOSE, CAPILLARY
Glucose-Capillary: 111 mg/dL — ABNORMAL HIGH (ref 70–99)
Glucose-Capillary: 175 mg/dL — ABNORMAL HIGH (ref 70–99)
Glucose-Capillary: 134 mg/dL — ABNORMAL HIGH (ref 70–99)

## 2022-05-23 LAB — HEPATITIS B SURFACE ANTIGEN: Hepatitis B Surface Ag: NONREACTIVE

## 2022-05-23 LAB — PHOSPHORUS: Phosphorus: 4.1 mg/dL (ref 2.5–4.6)

## 2022-05-23 LAB — MAGNESIUM: Magnesium: 2 mg/dL (ref 1.7–2.4)

## 2022-05-23 MED ORDER — PENTAFLUOROPROP-TETRAFLUOROETH EX AERO: 1.0000 | INHALATION_SPRAY | CUTANEOUS | Status: DC | PRN

## 2022-05-23 MED ORDER — HEPARIN SODIUM (PORCINE) 1000 UNIT/ML DIALYSIS: 1000.0000 [IU] | INTRAMUSCULAR | Status: DC | PRN

## 2022-05-23 MED ORDER — LIDOCAINE-PRILOCAINE 2.5-2.5 % EX CREA: 1.0000 | TOPICAL_CREAM | CUTANEOUS | Status: DC | PRN

## 2022-05-23 MED ORDER — LIDOCAINE HCL (PF) 1 % IJ SOLN: 5.0000 mL | INTRAMUSCULAR | Status: DC | PRN

## 2022-05-23 MED ORDER — TIZANIDINE HCL 2 MG PO TABS: 2.0000 mg | ORAL_TABLET | Freq: Every day | ORAL | Status: DC

## 2022-05-23 MED ORDER — ALTEPLASE 2 MG IJ SOLR: 2.0000 mg | Freq: Once | INTRAMUSCULAR | Status: DC | PRN

## 2022-05-23 MED ORDER — TIZANIDINE HCL 2 MG PO TABS
2.0000 mg | ORAL_TABLET | Freq: Once | ORAL | Status: AC
  Administered 2022-05-23: 2 mg via ORAL
  Filled 2022-05-23: qty 1

## 2022-05-23 NOTE — Progress Notes (Signed)
Pt receives out-pt HD at [facility] on MWF. Will assist as needed.   Melven Sartorius Renal Navigator [phone]

## 2022-05-23 NOTE — Progress Notes (Cosign Needed)
HD#1 Subjective:   Summary: [name] is a 57 y.o. male with pertinent PMH of  HFpEF, paroxysmal A-fib, T2DM, HTN, ESRD on HD M/W/F who presented with progressive dyspnea and cough for the past week and is admitted for acute hypoxemic respiratory failure secondary to pulmonary edema.   Overnight Events: Got emergent dialysis and was stable overnight.  Patient was seen in dialysis today.  He is doing well and much improved overall.  He does still have about some cough but is not having any issues breathing.  He denies any other new or worsening issues.  Objective:  Vital signs in last 24 hours: Vitals:   05/23/22 1251 05/23/22 1448 05/23/22 1505 05/23/22 1530  BP:  (!) 153/103 124/75 (!) 145/94  Pulse:  72 67   Resp:  17 14   Temp: 97.8 F (36.6 C) 98.9 F (37.2 C)    TempSrc:  Oral    SpO2:  98% 100%   Weight:  101.1 kg    Height:       Supplemental O2: Room Air SpO2: 100 % O2 Flow Rate (L/min): 2 L/min FiO2 (%): 100 %   Physical Exam:  Constitutional: Middle-age male. In no acute distress. Cardio:Regular rate and rhythm. 2+ bilateral radial and dorsalis pedis pulses. Pulm: Rales heard in the mid lung fields but predominantly clear to auscultation. Abdomen: Soft, non-tender, non-distended, positive bowel sounds. [id number] for extremity edema. Skin:Warm and dry. Neuro:Alert and oriented x3. No focal deficit noted. Psych:Pleasant mood and affect.  Filed Weights   05/22/22 1030 05/23/22 1448  Weight: 105.2 kg 101.1 kg     Intake/Output Summary (Last 24 hours) at 05/23/2022 1543 Last data filed at 05/23/2022 0019 Gross per 24 hour  Intake --  Output 5000 ml  Net -5000 ml   Net IO Since Admission: -5,000 mL [05/23/22 1543]  Pertinent Labs:    Latest Ref Rng & Units 05/23/2022    2:54 AM 05/22/2022   10:56 AM 03/22/2022    7:01 AM  CBC  WBC 4.0 - 10.5 K/uL 8.6  8.9  8.0   Hemoglobin 13.0 - 17.0 g/dL 12.1  11.1  10.5   Hematocrit 39.0 - 52.0 % 36.1   34.5  30.5   Platelets 150 - 400 K/uL 386  384  405        Latest Ref Rng & Units 05/23/2022    2:54 AM 05/22/2022   10:56 AM 03/22/2022    7:01 AM  CMP  Glucose 70 - 99 mg/dL 154  120  127   BUN 6 - 20 mg/dL 17  30  44   Creatinine 0.61 - 1.24 mg/dL 5.79  8.58  10.59   Sodium 135 - 145 mmol/L 132  137  137   Potassium 3.5 - 5.1 mmol/L 3.3  3.5  4.2   Chloride 98 - 111 mmol/L 91  96  95   CO2 22 - 32 mmol/L 28  28  29   $ Calcium 8.9 - 10.3 mg/dL 9.8  9.9  9.0   Total Protein 6.5 - 8.1 g/dL  6.9    Total Bilirubin 0.3 - 1.2 mg/dL  0.3    Alkaline Phos 38 - 126 U/L  62    AST 15 - 41 U/L  15    ALT 0 - 44 U/L  9      Assessment/Plan:   Principal Problem:   Acute hypoxemic respiratory failure (HCC) Active Problems:   Essential hypertension   Type  2 diabetes mellitus with hyperglycemia, with long-term current use of insulin (HCC)   Pleural effusion   Hypertensive urgency   AF (paroxysmal atrial fibrillation) (HCC)   ESRD (end stage renal disease) ([place])   Diabetic polyneuropathy associated with type 2 diabetes mellitus (HCC)   Elevated troponin   Acute pulmonary edema (HCC)   (HFpEF) heart failure with preserved ejection fraction [facility])   Patient Summary: [name] is a 57 y.o. male with pertinent PMH of  HFpEF, paroxysmal A-fib, T2DM, HTN, ESRD on HD M/W/F who presented with progressive dyspnea and cough for the past week and is admitted for acute hypoxemic respiratory failure secondary to pulmonary edema, on hospital day 1   Acute hypoxemic respiratory failure Pulmonary edema Patient is much improved this morning after emergent dialysis last night and undergoing dialysis again today to be back on schedule.  5 L was taken off during emergent dialysis yesterday and another 3 L to be taken off today.  We were able to titrate his oxygen down to room air.  We will get a repeat chest x-ray tonight as there were some focal rales on exam to see if there is anything focal on  imaging after improvement of pulmonary edema. -HD today and back on regular schedule - Chest x-ray   ESRD on HD M/W/F Patient is followed by [place] [facility] and consistently because dialysis. He has had issues with acute pulmonary edema and hypoxemic respiratory failure over the last few months.  He is also on the kidney transplant list at [facility].  Goal is to clarify his EDW during admission. - Hemodialysis per nephrology   Severe hypertension, resolved Elevated troponin Presented with blood pressure 201/106 on admission. Troponins were elevated at 114 but trended down to 103 and EKG did not show any signs of ischemia.  With fluid removal patient's blood pressure normalized and he was normotensive during exam today. Patient is noted to have multiple different prescriptions including some from [place] recently that include losartan, nifedipine, labetalol, including other GDMT like spironolactone in the past.  After talking with pharmacy he had not recall picking up several of these prescriptions.  We will work with nephrology to come up with the most optimal hypertensive medications prior to discharge. - Continue amlodipine 10 mg daily and carvedilol 25 mg twice daily - Will defer further antihypertensive decisions to nephrology but we are happy to help if needed   HFpEF Last echo readily available to [place] was in 2019 which showed EF of 60 to 123456, grade 1 diastolic dysfunction, and moderately dilated left atrium.   Patient follows with Dr. Harrell Gave. - Repeat echo here - Continue carvedilol 25 mg twice daily   Pleuritic chest pain with no evidence of pulmonary embolism on CTA of the chest Patient had pleuritic chest pain on the left side with elevated troponins as above and no pulmonary embolism on CTA of the chest.  He does have a history of chest trauma with resultant empyema that required VATS.  He did have some focal rales on exam today and we will further characterize with a repeat  chest x-ray today.  T2DM with neuropathy A1c 6.4.  He is on insulin 70/30 and uses 15 units twice daily with some noted symptoms of low blood sugar.  We will evaluate this further as we watch his sugars during his admission. - SSI - Gabapentin 300 mg daily   Paroxysmal A-fib Noted history of paroxysmal A-fib with an event few years ago of A-fib with  RVR and had been on Eliquis without issue but was recently told to stop taking this by provider. CHA2DS2-VASc score of 3. Will discuss tomorrow with patient and plan to restart eliquis prior to discharge. - Carvedilol 25 mg twice daily   Possible COPD/asthma No documented history of the PFTs to support diagnosis of COPD or asthma but patient does have prescription for Advair and albuterol.  Pharmacy did clarify that he has not picked these up so we will discontinue these.  Diet: Renal IVF: None, VTE: Heparin Code: Full  Dispo: Anticipated discharge to Home in 1-3 days pending further improvement and stabilization of acute hypoxemic respiratory failure.   Johny Blamer, DO Internal Medicine Resident PGY-1 Pager: [phone]  Please contact the on call pager after 5 pm and on weekends at [phone].

## 2022-05-23 NOTE — Progress Notes (Signed)
[facility] Progress Note   Subjective:    Seen and examined patient at bedside. He received emergent dialysis yesterday and tolerated a net UF 5L. He reports his breathing has improved and now on 2L [place]. Discussed with Dr. Posey Pronto. Plan for HD again this afternoon.  Objective Vitals:   05/23/22 0400 05/23/22 0736 05/23/22 0814 05/23/22 0905  BP: (!) 173/96  (!) 186/95   Pulse: 75     Resp: 19     Temp: 98.4 F (36.9 C)   99 F (37.2 C)  TempSrc: Oral     SpO2: 98% 94%    Weight:      Height:       Physical Exam General: Awake, alert, on 2L [place], NAD Heart: S1 and S2; No MGRs Lungs: Coarse/diminished lower bilaterally and clear in uppers Abdomen: Large, soft, and non-tender Extremities: No LE edema Dialysis Access: L AVF (+) B/T   Filed Weights   05/22/22 1030  Weight: 105.2 kg    Intake/Output Summary (Last 24 hours) at 05/23/2022 0931 Last data filed at 05/23/2022 0019 Gross per 24 hour  Intake --  Output 5000 ml  Net -5000 ml    Additional Objective Labs: Basic Metabolic Panel: Recent Labs  Lab 05/22/22 1056 05/23/22 0254  NA 137 132*  K 3.5 3.3*  CL 96* 91*  CO2 28 28  GLUCOSE 120* 154*  BUN 30* 17  CREATININE 8.58* 5.79*  CALCIUM 9.9 9.8  PHOS  --  4.1   Liver Function Tests: Recent Labs  Lab 05/22/22 1056  AST 15  ALT 9  ALKPHOS 62  BILITOT 0.3  PROT 6.9  ALBUMIN 3.2*   No results for input(s): "LIPASE", "AMYLASE" in the last 168 hours. CBC: Recent Labs  Lab 05/22/22 1056 05/23/22 0254  WBC 8.9 8.6  NEUTROABS 5.4  --   HGB 11.1* 12.1*  HCT 34.5* 36.1*  MCV 96.1 92.3  PLT 384 386   Blood Culture    Component Value Date/Time   SDES BLOOD RIGHT ANTECUBITAL 03/19/2022 0305   SPECREQUEST  03/19/2022 0305    BOTTLES DRAWN AEROBIC AND ANAEROBIC Blood Culture results may not be optimal due to an excessive volume of blood received in culture bottles   CULT  03/19/2022 0305    NO GROWTH 5 DAYS Performed at [facility], [place] [address] [place], [place] [address]    REPTSTATUS 03/24/2022 FINAL 03/19/2022 0305    Cardiac Enzymes: No results for input(s): "CKTOTAL", "CKMB", "CKMBINDEX", "TROPONINI" in the last 168 hours. CBG: Recent Labs  Lab 05/23/22 0804  GLUCAP 111*   Iron Studies: No results for input(s): "IRON", "TIBC", "TRANSFERRIN", "FERRITIN" in the last 72 hours. Lab Results  Component Value Date   INR 1.14 05/16/2017   INR 1.12 12/27/2014   Studies/Results: CT Angio Chest PE W and/or Wo Contrast  Result Date: 05/22/2022 CLINICAL DATA:  PE suspected.  High probability EXAM: CT ANGIOGRAPHY CHEST WITH CONTRAST TECHNIQUE: Multidetector CT imaging of the chest was performed using the standard protocol during bolus administration of intravenous contrast. Multiplanar CT image reconstructions and MIPs were obtained to evaluate the vascular anatomy. RADIATION DOSE REDUCTION: This exam was performed according to the departmental dose-optimization program which includes automated exposure control, adjustment of the mA and/or kV according to patient size and/or use of iterative reconstruction technique. CONTRAST:  163m OMNIPAQUE IOHEXOL 350 MG/ML SOLN COMPARISON:  None Available. FINDINGS: Cardiovascular: Technique is suboptimal in the aorta is more dense than the  pulmonary arteries. Repeat exam was performed with some improvement. With this caveat, there are no filling defects within the pulmonary arteries to suggest acute pulmonary embolism. Coronary artery calcification and aortic atherosclerotic calcification. Mediastinum/Nodes: Moderate enlarged mediastinal lymph nodes. For example RIGHT lower paratracheal node measures 15 mm. Prevascular node measures 14 mm. No axillary adenopathy. Lungs/Pleura: Diffuse ground-glass densities throughout the lungs. There is interlobular septal thickening. Bilateral pleural effusions. Effusions are mild. Findings suggests a pulmonary edema pattern. Upper Abdomen:  Limited view of the liver, kidneys, pancreas are unremarkable. Normal adrenal glands. Musculoskeletal: No aggressive osseous lesion. Review of the MIP images confirms the above findings. IMPRESSION: 1. No evidence of acute pulmonary embolism. 2. Diffuse ground-glass densities and interlobular septal thickening suggests pulmonary edema. 3. Bilateral small pleural effusions. 4. Mediastinal lymphadenopathy is favored reactive. 5. Aortic atherosclerosis. Aortic Atherosclerosis (ICD10-I70.0). Electronically Signed   By: Suzy Bouchard M.D.   On: 05/22/2022 14:21   DG Chest Portable 1 View  Result Date: 05/22/2022 CLINICAL DATA:  Dyspnea, shortness of breath. EXAM: PORTABLE CHEST 1 VIEW COMPARISON:  Chest radiograph dated 03/18/2022. FINDINGS: The heart is enlarged. Moderate bilateral interstitial opacities are noted. There is a small right pleural effusion. No significant left pleural effusion. No pneumothorax. Degenerative changes are seen in the spine. IMPRESSION: Cardiomegaly with moderate bilateral interstitial opacities and small right pleural effusion. Findings may represent pulmonary edema or atypical infection. Electronically Signed   By: Zerita Boers M.D.   On: 05/22/2022 11:11    Medications:   amLODipine  10 mg Oral Daily   carvedilol  25 mg Oral BID WC   Chlorhexidine Gluconate Cloth  6 each Topical Q0600   gabapentin  300 mg Oral Daily   heparin  5,000 Units Subcutaneous Q8H   insulin aspart  0-6 Units Subcutaneous TID WC   mometasone-formoterol  2 puff Inhalation BID   multivitamin  1 tablet Oral QHS   pantoprazole  40 mg Oral Daily   sevelamer carbonate  1,600 mg Oral TID WC   [START ON 05/27/2022] Vitamin D (Ergocalciferol)  50,000 Units Oral Q Sun    Dialysis Orders: [facility], 4 hours 15 minutes, BFR 400/DFR 600, EDW 105 kg, 2K/2.0 calcium, left upper arm AV fistula, 15 G needles, heparin 4100 unit bolus, Venofer 100 mg 3 times weekly, Hectorol 7 mcg 3  times weekly, Sensipar 180 mg 3 times weekly   Assessment/Plan: 1.  Acute hypoxic respiratory failure: Secondary to volume overload/pulmonary edema seen on chest x-ray. Received emergent hemodialysis yesterday and will order HD again today for ongoing fluid removal.  Will assess post HD standing weight to make adjustments to EDW if needed. We discussed sodium restriction and the need for limiting intradialytic weight gain. 2.  End-stage renal disease: Usually on a Monday/Wednesday/Friday dialysis schedule at [facility] with decent adherence. Received HD yesterday and plan for HD again today (see above). Noted K+ 3.3, will use 3K bath today. Monitor trend. 3.  Hypertension: Significantly elevated blood pressures noted likely as a result of his volume status, monitor with ultrafiltration/hemodialysis. Continue Amlodipine and Carvedilol for now and will also review his outpatient records to verify home regimen. 4.  Anemia of chronic disease: Hgb now at goal. No indication for Fe and ESA at this time. 5.  Secondary hyperparathyroidism: Continue phosphorus binders and will restart his Cinacalcet and VDRA with dialysis.  Tobie Poet, NP [facility] 05/23/2022,9:31 AM  LOS: 1 day

## 2022-05-23 NOTE — Progress Notes (Signed)
Received patient in bed to unit.  Alert and oriented.  Informed consent signed and in chart.   TX duration: 2.2hrs Pt ended tx with 4mns left on the machine; CTobie Poet NP notified.  Patient tolerated well.  Transported back to the room  Alert, without acute distress.  Hand-off given to patient's nurse.   Access used: L AVF Access issues: None  Total UF removed: 20074mMedication(s) given: None  Post HD weight: 99.1kg   05/23/22 1736  Vitals  Temp 98.6 F (37 C)  Temp Source Oral  BP (!) 129/91  MAP (mmHg) 104  BP Location Right Arm  BP Method Automatic  Patient Position (if appropriate) Lying  Pulse Rate 76  Pulse Rate Source Monitor  ECG Heart Rate 74  Resp (!) 23  Oxygen Therapy  SpO2 95 %  O2 Device Room Air  During Treatment Monitoring  Blood Flow Rate (mL/min) 400 mL/min  Arterial Pressure (mmHg) -160 mmHg  Venous Pressure (mmHg) 300 mmHg  TMP (mmHg) 5 mmHg  Ultrafiltration Rate (mL/min) 1266 mL/min  Dialysate Flow Rate (mL/min) 300 ml/min  HD Safety Checks Performed Yes  Intra-Hemodialysis Comments Tx completed;See progress note (txt ended early per patient request. rn notified)  Post Treatment  Dialyzer Clearance Lightly streaked  Duration of HD Treatment -hour(s) 2.2 hour(s)  Liters Processed 55.9  Fluid Removed (mL) 2000 mL  AVG/AVF Arterial Site Held (minutes) 7 minutes  AVG/AVF Venous Site Held (minutes) 7 minutes  Fistula / Graft Left Upper arm Arteriovenous fistula  Placement Date/Time: 12/12/21 0941   Placed prior to admission: No  Orientation: Left  Access Location: Upper arm  Access Type: Arteriovenous fistula  Site Condition No complications  Fistula / Graft Assessment Present;Thrill;Bruit  Status Deaccessed     [facility]

## 2022-05-24 ENCOUNTER — Other Ambulatory Visit (HOSPITAL_COMMUNITY): Payer: Self-pay

## 2022-05-24 ENCOUNTER — Inpatient Hospital Stay (HOSPITAL_COMMUNITY): Payer: Medicare Other

## 2022-05-24 DIAGNOSIS — I5033 Acute on chronic diastolic (congestive) heart failure: Secondary | ICD-10-CM

## 2022-05-24 LAB — BASIC METABOLIC PANEL
Anion gap: 14 (ref 5–15)
BUN: 26 mg/dL — ABNORMAL HIGH (ref 6–20)
CO2: 29 mmol/L (ref 22–32)
Calcium: 9.9 mg/dL (ref 8.9–10.3)
Chloride: 89 mmol/L — ABNORMAL LOW (ref 98–111)
Creatinine, Ser: 6.9 mg/dL — ABNORMAL HIGH (ref 0.61–1.24)
GFR, Estimated: 9 mL/min — ABNORMAL LOW (ref >60)
Glucose, Bld: 133 mg/dL — ABNORMAL HIGH (ref 70–99)
Potassium: 3.3 mmol/L — ABNORMAL LOW (ref 3.5–5.1)
Sodium: 132 mmol/L — ABNORMAL LOW (ref 135–145)

## 2022-05-24 LAB — CBC
HCT: 36.1 % — ABNORMAL LOW (ref 39.0–52.0)
Hemoglobin: 12.1 g/dL — ABNORMAL LOW (ref 13.0–17.0)
MCH: 31 pg (ref 26.0–34.0)
MCHC: 33.5 g/dL (ref 30.0–36.0)
MCV: 92.6 fL (ref 80.0–100.0)
Platelets: 364 10*3/uL (ref 150–400)
RBC: 3.9 MIL/uL — ABNORMAL LOW (ref 4.22–5.81)
RDW: 14.2 % (ref 11.5–15.5)
WBC: 6.7 10*3/uL (ref 4.0–10.5)
nRBC: 0 % (ref 0.0–0.2)

## 2022-05-24 LAB — ECHOCARDIOGRAM COMPLETE
Area-P 1/2: 2.75 cm2
Height: 67 in
S' Lateral: 3.3 cm
Weight: 3492.09 oz

## 2022-05-24 LAB — GLUCOSE, CAPILLARY
Glucose-Capillary: 147 mg/dL — ABNORMAL HIGH (ref 70–99)
Glucose-Capillary: 187 mg/dL — ABNORMAL HIGH (ref 70–99)

## 2022-05-24 LAB — MAGNESIUM: Magnesium: 2.2 mg/dL (ref 1.7–2.4)

## 2022-05-24 LAB — HEPATITIS B SURFACE ANTIBODY, QUANTITATIVE: Hep B S AB Quant (Post): >1000 m[IU]/mL

## 2022-05-24 MED ORDER — POTASSIUM CHLORIDE CRYS ER 20 MEQ PO TBCR
40.0000 meq | EXTENDED_RELEASE_TABLET | Freq: Once | ORAL | Status: AC
  Administered 2022-05-24: 40 meq via ORAL
  Filled 2022-05-24: qty 2

## 2022-05-24 MED ORDER — INSULIN LISPRO PROT & LISPRO (75-25 MIX) 100 UNIT/ML KWIKPEN
5.0000 [IU] | PEN_INJECTOR | Freq: Two times a day (BID) | SUBCUTANEOUS | 11 refills | Status: DC
  Filled 2022-05-24: qty 15, 75d supply, fill #0

## 2022-05-24 MED ORDER — PANTOPRAZOLE SODIUM 40 MG PO TBEC
40.0000 mg | DELAYED_RELEASE_TABLET | Freq: Every day | ORAL | 0 refills | Status: DC
  Filled 2022-05-24: qty 30, 30d supply, fill #0

## 2022-05-24 MED ORDER — INSULIN PEN NEEDLE 32G X 4 MM MISC
0 refills | Status: DC
  Filled 2022-05-24: qty 100, 30d supply, fill #0

## 2022-05-24 MED ORDER — SEVELAMER CARBONATE 800 MG PO TABS
1600.0000 mg | ORAL_TABLET | Freq: Three times a day (TID) | ORAL | 0 refills | Status: DC
  Filled 2022-05-24: qty 270, 30d supply, fill #0

## 2022-05-24 MED ORDER — GABAPENTIN 300 MG PO CAPS
300.0000 mg | ORAL_CAPSULE | Freq: Every day | ORAL | 0 refills | Status: DC
  Filled 2022-05-24: qty 30, 30d supply, fill #0

## 2022-05-24 MED ORDER — CARVEDILOL 25 MG PO TABS
25.0000 mg | ORAL_TABLET | Freq: Two times a day (BID) | ORAL | 0 refills | Status: DC
  Filled 2022-05-24: qty 60, 30d supply, fill #0

## 2022-05-24 MED ORDER — TIZANIDINE HCL 4 MG PO TABS
4.0000 mg | ORAL_TABLET | Freq: Three times a day (TID) | ORAL | 0 refills | Status: DC | PRN
  Filled 2022-05-24: qty 90, 30d supply, fill #0

## 2022-05-24 MED ORDER — RENA-VITE PO TABS
1.0000 | ORAL_TABLET | Freq: Every day | ORAL | 0 refills | Status: AC
  Filled 2022-05-24: qty 30, 30d supply, fill #0

## 2022-05-24 MED ORDER — AMLODIPINE BESYLATE 10 MG PO TABS
10.0000 mg | ORAL_TABLET | Freq: Every day | ORAL | 0 refills | Status: DC
  Filled 2022-05-24: qty 30, 30d supply, fill #0

## 2022-05-24 NOTE — Discharge Summary (Signed)
Name: [name] MRN: [id number] DOB: [birth year] 57 y.o. PCP: Bartholome Bill, MD  Date of Admission: 05/22/2022 10:21 AM Date of Discharge: 05/24/2022 Attending Physician: Dr. Jimmye Norman  Discharge Diagnosis: Principal Problem:   Acute hypoxemic respiratory failure ([place]) Active Problems:   Severe uncontrolled hypertension   Type 2 diabetes mellitus with hyperglycemia, with long-term current use of insulin (HCC)   Pleural effusion   Acute on chronic diastolic heart failure (HCC)   Hypertensive urgency   AF (paroxysmal atrial fibrillation) (HCC)   ESRD (end stage renal disease) ([place])   Diabetic polyneuropathy associated with type 2 diabetes mellitus (HCC)   Elevated troponin   Acute pulmonary edema ([place])    Discharge Medications: Allergies as of 05/24/2022   No Known Allergies      Medication List     STOP taking these medications    Advair HFA 115-21 MCG/ACT inhaler Generic drug: fluticasone-salmeterol   albuterol 108 (90 Base) MCG/ACT inhaler Commonly known as: VENTOLIN HFA   losartan 25 MG tablet Commonly known as: COZAAR   NovoLOG Mix 70/30 FlexPen (70-30) 100 UNIT/ML FlexPen Generic drug: insulin aspart protamine - aspart Replaced by: HumaLOG Mix 75/25 KwikPen (75-25) 100 UNIT/ML Kwikpen       TAKE these medications    Accu-Chek Softclix Lancets lancets Use as directed.  For insulin-dependent type 2 diabetes Monitor  glucose 3 times a day   acetaminophen 500 MG tablet Commonly known as: TYLENOL Take 1,000-1,500 mg by mouth 2 (two) times daily as needed (pain).   amLODipine 10 MG tablet Commonly known as: NORVASC Take 1 tablet (10 mg total) by mouth daily.   BD Pen Needle Nano U/F 32G X 4 MM Misc Generic drug: Insulin Pen Needle Use as directed up to four times daily   benzonatate 100 MG capsule Commonly known as: TESSALON Take 100 mg by mouth 3 (three) times daily as needed for cough.   calcitRIOL 0.25 MCG capsule Commonly known as:  ROCALTROL Take 1 capsule (0.25 mcg total) by mouth every Monday, Wednesday, and Friday.   carvedilol 25 MG tablet Commonly known as: COREG Take 1 tablet (25 mg total) by mouth 2 (two) times daily with a meal.   cetirizine 10 MG tablet Commonly known as: ZYRTEC Take 10 mg by mouth daily.   gabapentin 300 MG capsule Commonly known as: NEURONTIN Take 1 capsule (300 mg total) by mouth daily. What changed: when to take this   glucose blood test strip Use as instructed For insulin-dependent type 2 diabetes Monitor  glucose 3 times a day   HumaLOG Mix 75/25 KwikPen (75-25) 100 UNIT/ML Kwikpen Generic drug: Insulin Lispro Prot & Lispro Inject 5-10 Units into the skin 2 (two) times daily with a meal. Replaces: NovoLOG Mix 70/30 FlexPen (70-30) 100 UNIT/ML FlexPen   multivitamin Tabs tablet Take 1 tablet by mouth at bedtime.   pantoprazole 40 MG tablet Commonly known as: PROTONIX Take 1 tablet (40 mg total) by mouth daily.   sevelamer carbonate 800 MG tablet Commonly known as: RENVELA Take 2-3 tablets (1,600-2,400 mg total) by mouth 3 (three) times daily with meals.   tiZANidine 4 MG tablet Commonly known as: ZANAFLEX Take 1 tablet (4 mg total) by mouth every 8 (eight) hours as needed for muscle spasms.   Vitamin D (Ergocalciferol) 1.25 MG (50000 UNIT) Caps capsule Commonly known as: DRISDOL Take 50,000 Units by mouth every Sunday.        Disposition and follow-up:   [name] was discharged from  [facility] in Good condition.  At the hospital follow up visit please address:  1.  Follow-up:  a.  Please ensure that the patient continues to go to dialysis and is taking all the medications prescribed to him and no extra medications.  He has told us that his family took all of his medications from his home and brought them to his PCP office on the day he was admitted.  Please ensure he has a good understanding of his medications.    b.  Follow-up on  his insulin needs as he did not require much insulin at all here and had noted hypoglycemic symptoms at home.  We decreased his insulin doses on discharge to help prevent lows.   c.  Ensure that he has a good understanding of his fluid restriction as he thought he was restricted to 12 ounces of fluid a day.  2.  Labs / imaging needed at time of follow-up: BMP, CBC  3.  Pending labs/ test needing follow-up: None  Follow-up Appointments: Patient will follow-up with his PCP and continue to go to dialysis on his regular schedule.  Hospital Course by problem list: [name] is a 57 y.o. male with pertinent PMH of  HFpEF, paroxysmal A-fib, T2DM, HTN, ESRD on HD M/W/F who presented with progressive dyspnea and cough for the past week and is admitted for acute hypoxemic respiratory failure secondary to pulmonary edema, on hospital day 2     Acute hypoxemic respiratory failure Pulmonary edema Patient presented with oxygen saturations of 88 on room air with significant pulmonary edema on imaging.  He was seen by nephrology in the ED and taken for emergent dialysis.  5 L was taken off during emergent dialysis and he was dialyzed again the next day on his regular schedule.  After that he was on room air without any issues.  Repeat chest x-ray showed some mild left basilar atelectasis but no other significant pulmonary edema.  It is overall difficult to tell whether his pulmonary edema is caused by his hypertension or his hypertension is caused by fluid overload but this has happened a few times over the past several months and he will need careful monitoring outpatient to ensure this does not continue to happen.     ESRD on HD M/W/F Patient is followed by [facility] and consistently goes to dialysis.  He is also on the kidney transplant list at [facility].  Recently his dry weight has been fluctuating.  Estimated dry weight here of 99.1 kg.  He will continue on his regular hemodialysis schedule  after discharge.   Severe hypertension, resolved Elevated troponin Presented with blood pressure 201/106 on admission. Troponins were elevated at 114 but trended down to 103 and EKG did not show any signs of ischemia.  With fluid removal patient's blood pressure normalized and he was normotensive during exam today. Patient is noted to have multiple different prescriptions including some from [place] recently that include losartan, nifedipine, labetalol, including other GDMT like spironolactone in the past.  After talking with pharmacy he had not recall picking up several of these prescriptions.  He responded pretty well to amlodipine 10 mg daily and carvedilol 25 mg twice daily.  He was discharged on these medications and we we will defer further antihypertensive changes to his outpatient nephrologist.   HFpEF Last echo readily available to [place] was in 2019 which showed EF of 60 to [id number], grade 1 diastolic dysfunction, and moderately dilated left atrium.  Patient follows with Dr. Harrell Gave.  Repeat echo here shows LVEF 50 to 55% with low normal LV function and severe asymmetric LVH of the basal septal segment with grade 1 diastolic dysfunction of the LV.  There is also mildly reduced RV systolic function and mild dilation of the ascending aorta measuring 38 mm.  Patient was continued on carvedilol 25 mg twice daily during admission.  Recommend to continue to follow-up with Dr. Harrell Gave.   Pleuritic chest pain with no evidence of pulmonary embolism on CTA of the chest Patient had pleuritic chest pain on the left side with elevated troponins as above and no pulmonary embolism on CTA of the chest.  He does have a history of chest trauma with resultant empyema that required VATS.  Chest pain improved during admission.   T2DM with neuropathy A1c 6.4. He is on insulin 70/30 and uses 15 units twice daily with some noted hypoglycemic symptoms at home.  He did not require much sliding scale insulin during  his admission.  We discharged him on Humalog 70/30 with a plan to use 5-10 units if his blood sugars are high.  He was taking gabapentin 600 mg twice daily when he came in which is well above the recommended maximum dose for ESRD patients.  We continued him on gabapentin 300 mg daily on discharge.   Paroxysmal A-fib Noted history of paroxysmal A-fib with an event few years ago of A-fib with RVR and had been on Eliquis without issue but was recently told to stop taking this by provider. CHA2DS2-VASc score of 3.  Recommended that he discuss resuming this medication with his PCP and he was continued on carvedilol 25 mg twice daily.  Remained in normal sinus rhythm.   Possible COPD/asthma No documented history of the PFTs to support diagnosis of COPD or asthma but patient does have prescription for Advair and albuterol.  Pharmacy did clarify that he has not picked these up so we discontinued these on discharge.   Discharge Subjective: Patient is doing well today without any difficulty breathing.  He still has a little bit of a lingering cough but is not coughing much up.  He denies any new or worsening issues and is ready to go home.  Discharge Exam:   BP (!) 129/92 (BP Location: Left Arm)   Pulse 69   Temp 97.9 F (36.6 C) (Oral)   Resp 14   Ht 5' 7"$  (1.702 m)   Wt 99 kg   SpO2 95%   BMI 34.18 kg/m  Constitutional: Middle-age male. In no acute distress. Cardio:Regular rate and rhythm. 2+ bilateral radial and dorsalis pedis pulses. Pulm: Clear to auscultation bilaterally, breathing comfortably on room air [id number] for extremity edema. Skin:Warm and dry. Neuro:Alert and oriented x3. No focal deficit noted. Psych:Pleasant mood and affect.  Pertinent Labs, Studies, and Procedures:     Latest Ref Rng & Units 05/24/2022    5:24 AM 05/23/2022    2:54 AM 05/22/2022   10:56 AM  CBC  WBC 4.0 - 10.5 K/uL 6.7  8.6  8.9   Hemoglobin 13.0 - 17.0 g/dL 12.1  12.1  11.1   Hematocrit 39.0 - 52.0 %  36.1  36.1  34.5   Platelets 150 - 400 K/uL 364  386  384        Latest Ref Rng & Units 05/24/2022    5:24 AM 05/23/2022    2:54 AM 05/22/2022   10:56 AM  CMP  Glucose 70 - 99 mg/dL 133  154  120   BUN 6 - 20 mg/dL 26  17  30   $ Creatinine 0.61 - 1.24 mg/dL 6.90  5.79  8.58   Sodium 135 - 145 mmol/L 132  132  137   Potassium 3.5 - 5.1 mmol/L 3.3  3.3  3.5   Chloride 98 - 111 mmol/L 89  91  96   CO2 22 - 32 mmol/L 29  28  28   $ Calcium 8.9 - 10.3 mg/dL 9.9  9.8  9.9   Total Protein 6.5 - 8.1 g/dL   6.9   Total Bilirubin 0.3 - 1.2 mg/dL   0.3   Alkaline Phos 38 - 126 U/L   62   AST 15 - 41 U/L   15   ALT 0 - 44 U/L   9     DG CHEST PORT 1 VIEW  Result Date: 05/23/2022 CLINICAL DATA:  Cough EXAM: PORTABLE CHEST 1 VIEW COMPARISON:  Chest radiograph and CT 05/22/2022 FINDINGS: Mild left basilar atelectasis or infiltrate. Lungs are otherwise clear. No pneumothorax or pleural effusion. Cardiac size within normal limits. Pulmonary vascularity is normal. No acute bone abnormality. IMPRESSION: 1. Mild left basilar atelectasis or infiltrate. Electronically Signed   By: Fidela Salisbury M.D.   On: 05/23/2022 20:24   CT Angio Chest PE W and/or Wo Contrast  Result Date: 05/22/2022 CLINICAL DATA:  PE suspected.  High probability EXAM: CT ANGIOGRAPHY CHEST WITH CONTRAST TECHNIQUE: Multidetector CT imaging of the chest was performed using the standard protocol during bolus administration of intravenous contrast. Multiplanar CT image reconstructions and MIPs were obtained to evaluate the vascular anatomy. RADIATION DOSE REDUCTION: This exam was performed according to the departmental dose-optimization program which includes automated exposure control, adjustment of the mA and/or kV according to patient size and/or use of iterative reconstruction technique. CONTRAST:  122m OMNIPAQUE IOHEXOL 350 MG/ML SOLN COMPARISON:  None Available. FINDINGS: Cardiovascular: Technique is suboptimal in the aorta is more dense  than the pulmonary arteries. Repeat exam was performed with some improvement. With this caveat, there are no filling defects within the pulmonary arteries to suggest acute pulmonary embolism. Coronary artery calcification and aortic atherosclerotic calcification. Mediastinum/Nodes: Moderate enlarged mediastinal lymph nodes. For example RIGHT lower paratracheal node measures 15 mm. Prevascular node measures 14 mm. No axillary adenopathy. Lungs/Pleura: Diffuse ground-glass densities throughout the lungs. There is interlobular septal thickening. Bilateral pleural effusions. Effusions are mild. Findings suggests a pulmonary edema pattern. Upper Abdomen: Limited view of the liver, kidneys, pancreas are unremarkable. Normal adrenal glands. Musculoskeletal: No aggressive osseous lesion. Review of the MIP images confirms the above findings. IMPRESSION: 1. No evidence of acute pulmonary embolism. 2. Diffuse ground-glass densities and interlobular septal thickening suggests pulmonary edema. 3. Bilateral small pleural effusions. 4. Mediastinal lymphadenopathy is favored reactive. 5. Aortic atherosclerosis. Aortic Atherosclerosis (ICD10-I70.0). Electronically Signed   By: SSuzy BouchardM.D.   On: 05/22/2022 14:21   DG Chest Portable 1 View  Result Date: 05/22/2022 CLINICAL DATA:  Dyspnea, shortness of breath. EXAM: PORTABLE CHEST 1 VIEW COMPARISON:  Chest radiograph dated 03/18/2022. FINDINGS: The heart is enlarged. Moderate bilateral interstitial opacities are noted. There is a small right pleural effusion. No significant left pleural effusion. No pneumothorax. Degenerative changes are seen in the spine. IMPRESSION: Cardiomegaly with moderate bilateral interstitial opacities and small right pleural effusion. Findings may represent pulmonary edema or atypical infection. Electronically Signed   By: TZerita BoersM.D.   On: 05/22/2022 11:11     Discharge Instructions: Discharge  Instructions     Call MD for:   difficulty breathing, headache or visual disturbances   Complete by: As directed    Call MD for:  extreme fatigue   Complete by: As directed    Call MD for:  persistant dizziness or light-headedness   Complete by: As directed    Call MD for:  persistant nausea and vomiting   Complete by: As directed    Call MD for:  severe uncontrolled pain   Complete by: As directed    Call MD for:  temperature >100.4   Complete by: As directed    Diet - low sodium heart healthy   Complete by: As directed    Increase activity slowly   Complete by: As directed        Signed: Johny Blamer, DO 05/24/2022, 3:20 PM   Pager: [phone]

## 2022-05-24 NOTE — Discharge Instructions (Addendum)
[name],  You were recently admitted to [facility] for fluid buildup in your lungs.   Continue taking your home medications with the following changes  Start taking these medications at these doses and talk to your primary care provider and nephrologist before changing any of them Amlodipine 10 mg daily Carvedilol 25 mg twice a day Calcitriol 0.25 mcg every Monday, Wednesday, and Friday  Gabapentin 300 mg daily Tizanidine 4 mg every 8 hours as needed.  We recommend you discuss this with your primary care provider and nephrologist to see if you need to decrease the dose Rena-Vite vitamins daily Pantoprazole 40 mg daily Renvela 1600 mg 3 times a day with meals Vitamin D 50,000 units every Sunday Please monitor your blood sugars at home and if they are above 180 please take 5-10 units of the Novolog.  Please review this with your primary care provider.  While you were in the hospital you required very little insulin.  Your new estimated dry weight is 99.1 kg.  This may be adjusted by your nephrologist and dialysis but this was your weight after dialysis yesterday.   You should seek further medical care if have any return of your symptoms or any new or worsening symptoms.  We recommend that you see your primary care doctor in about a week to make sure that you continue to improve. We are so glad that you are feeling better.  Sincerely, Johny Blamer, DO

## 2022-05-24 NOTE — TOC Benefit Eligibility Note (Signed)
Patient Teacher, English as a foreign language completed.    The patient is currently admitted and upon discharge could be taking Eliquis 5 mg.  The current 30 day co-pay is $11.20.   The patient is insured through [place], [place] Patient Advocate Specialist [place] Patient Advocate Team Direct Number: [phone]  Fax: [phone]

## 2022-05-24 NOTE — Progress Notes (Signed)
  Echocardiogram 2D Echocardiogram has been performed.  [name] M 05/24/2022, 8:59 AM

## 2022-05-24 NOTE — Progress Notes (Addendum)
[facility] Progress Note   Subjective:    Seen and examined patient at bedside. Feeling much better. He is now on RA and BP is currently controlled. He denies SOB, CP, and N/V. Patient s/o 68mn early yesterday d/t cramping and nausea. Noted UF 2L. Patient stable for discharge from renal standpoint.  Objective Vitals:   05/23/22 2010 05/24/22 0004 05/24/22 0300 05/24/22 0722  BP: (!) 174/101 118/83 (!) 151/91 (!) 153/94  Pulse: 74 70 66 71  Resp: 18 17 17 20  $ Temp: 98.7 F (37.1 C) 97.8 F (36.6 C) 98.5 F (36.9 C) 97.7 F (36.5 C)  TempSrc: Oral Oral Oral Oral  SpO2: 98% 97% 95% 93%  Weight:      Height:       Physical Exam General: Awake, alert, on RA, NAD Heart: S1 and S2; No MGRs Lungs: Diminished in lower and clear in uppers; No wheezing, rales, or rhonchi Abdomen: Large, soft, and non-tender Extremities: No LE edema Dialysis Access: L AVF (+) B/T   Filed Weights   05/22/22 1030 05/23/22 1448 05/23/22 1747  Weight: 105.2 kg 101.1 kg 99 kg    Intake/Output Summary (Last 24 hours) at 05/24/2022 0905 Last data filed at 05/23/2022 1736 Gross per 24 hour  Intake --  Output 2000 ml  Net -2000 ml    Additional Objective Labs: Basic Metabolic Panel: Recent Labs  Lab 05/22/22 1056 05/23/22 0254 05/24/22 0524  NA 137 132* 132*  K 3.5 3.3* 3.3*  CL 96* 91* 89*  CO2 28 28 29  $ GLUCOSE 120* 154* 133*  BUN 30* 17 26*  CREATININE 8.58* 5.79* 6.90*  CALCIUM 9.9 9.8 9.9  PHOS  --  4.1  --    Liver Function Tests: Recent Labs  Lab 05/22/22 1056  AST 15  ALT 9  ALKPHOS 62  BILITOT 0.3  PROT 6.9  ALBUMIN 3.2*   No results for input(s): "LIPASE", "AMYLASE" in the last 168 hours. CBC: Recent Labs  Lab 05/22/22 1056 05/23/22 0254 05/24/22 0524  WBC 8.9 8.6 6.7  NEUTROABS 5.4  --   --   HGB 11.1* 12.1* 12.1*  HCT 34.5* 36.1* 36.1*  MCV 96.1 92.3 92.6  PLT 384 386 364   Blood Culture    Component Value Date/Time   SDES BLOOD RIGHT  ANTECUBITAL 03/19/2022 0305   SPECREQUEST  03/19/2022 0305    BOTTLES DRAWN AEROBIC AND ANAEROBIC Blood Culture results may not be optimal due to an excessive volume of blood received in culture bottles   CULT  03/19/2022 0305    NO GROWTH 5 DAYS Performed at [facility] [place] [id number]   REPTSTATUS 03/24/2022 FINAL 03/19/2022 0305    Cardiac Enzymes: No results for input(s): "CKTOTAL", "CKMB", "CKMBINDEX", "TROPONINI" in the last 168 hours. CBG: Recent Labs  Lab 05/23/22 0804 05/23/22 1201 05/23/22 2013 05/24/22 0724  GLUCAP 111* 175* 134* 147*   Iron Studies: No results for input(s): "IRON", "TIBC", "TRANSFERRIN", "FERRITIN" in the last 72 hours. Lab Results  Component Value Date   INR 1.14 05/16/2017   INR 1.12 12/27/2014   Studies/Results: DG CHEST PORT 1 VIEW  Result Date: 05/23/2022 CLINICAL DATA:  Cough EXAM: PORTABLE CHEST 1 VIEW COMPARISON:  Chest radiograph and CT 05/22/2022 FINDINGS: Mild left basilar atelectasis or infiltrate. Lungs are otherwise clear. No pneumothorax or pleural effusion. Cardiac size within normal limits. Pulmonary vascularity is normal. No acute bone abnormality. IMPRESSION: 1. Mild left basilar atelectasis  or infiltrate. Electronically Signed   By: Fidela Salisbury M.D.   On: 05/23/2022 20:24   CT Angio Chest PE W and/or Wo Contrast  Result Date: 05/22/2022 CLINICAL DATA:  PE suspected.  High probability EXAM: CT ANGIOGRAPHY CHEST WITH CONTRAST TECHNIQUE: Multidetector CT imaging of the chest was performed using the standard protocol during bolus administration of intravenous contrast. Multiplanar CT image reconstructions and MIPs were obtained to evaluate the vascular anatomy. RADIATION DOSE REDUCTION: This exam was performed according to the departmental dose-optimization program which includes automated exposure control, adjustment of the mA and/or kV according to patient size and/or use of iterative  reconstruction technique. CONTRAST:  181m OMNIPAQUE IOHEXOL 350 MG/ML SOLN COMPARISON:  None Available. FINDINGS: Cardiovascular: Technique is suboptimal in the aorta is more dense than the pulmonary arteries. Repeat exam was performed with some improvement. With this caveat, there are no filling defects within the pulmonary arteries to suggest acute pulmonary embolism. Coronary artery calcification and aortic atherosclerotic calcification. Mediastinum/Nodes: Moderate enlarged mediastinal lymph nodes. For example RIGHT lower paratracheal node measures 15 mm. Prevascular node measures 14 mm. No axillary adenopathy. Lungs/Pleura: Diffuse ground-glass densities throughout the lungs. There is interlobular septal thickening. Bilateral pleural effusions. Effusions are mild. Findings suggests a pulmonary edema pattern. Upper Abdomen: Limited view of the liver, kidneys, pancreas are unremarkable. Normal adrenal glands. Musculoskeletal: No aggressive osseous lesion. Review of the MIP images confirms the above findings. IMPRESSION: 1. No evidence of acute pulmonary embolism. 2. Diffuse ground-glass densities and interlobular septal thickening suggests pulmonary edema. 3. Bilateral small pleural effusions. 4. Mediastinal lymphadenopathy is favored reactive. 5. Aortic atherosclerosis. Aortic Atherosclerosis (ICD10-I70.0). Electronically Signed   By: SSuzy BouchardM.D.   On: 05/22/2022 14:21   DG Chest Portable 1 View  Result Date: 05/22/2022 CLINICAL DATA:  Dyspnea, shortness of breath. EXAM: PORTABLE CHEST 1 VIEW COMPARISON:  Chest radiograph dated 03/18/2022. FINDINGS: The heart is enlarged. Moderate bilateral interstitial opacities are noted. There is a small right pleural effusion. No significant left pleural effusion. No pneumothorax. Degenerative changes are seen in the spine. IMPRESSION: Cardiomegaly with moderate bilateral interstitial opacities and small right pleural effusion. Findings may represent pulmonary  edema or atypical infection. Electronically Signed   By: TZerita BoersM.D.   On: 05/22/2022 11:11    Medications:   amLODipine  10 mg Oral Daily   carvedilol  25 mg Oral BID WC   Chlorhexidine Gluconate Cloth  6 each Topical Q0600   gabapentin  300 mg Oral Daily   heparin  5,000 Units Subcutaneous Q8H   insulin aspart  0-6 Units Subcutaneous TID WC   multivitamin  1 tablet Oral QHS   pantoprazole  40 mg Oral Daily   sevelamer carbonate  1,600 mg Oral TID WC   [START ON 05/27/2022] Vitamin D (Ergocalciferol)  50,000 Units Oral Q Sun    [facility] 15 minutes, BFR 400/DFR 600, EDW 105 kg, 2K/2.0 calcium, left upper arm AV fistula, 15 G needles, heparin 4100 unit bolus, Venofer 100 mg 3 times weekly, Hectorol 7 mcg 3 times weekly, Sensipar 180 mg 3 times weekly    Assessment/Plan: 1.  Acute hypoxic respiratory failure: Secondary to volume overload/pulmonary edema seen on chest x-ray. Received HD 2/13 and 2/14. Patient feeling much better. He is now on RA and BP is now controlled. Noted post HD weight was 99.1kg. Will lower EDW at discharge. We discussed sodium restriction and the need for limiting intradialytic weight gain. 2.  End-stage renal disease: Usually on a Monday/Wednesday/Friday dialysis schedule at [facility] with decent adherence. Received HD 2/13 and 2/14 (see above). K+ is 3.3-ordered 28mq KCL X 1. Monitor trends 3.  Hypertension: Now controlled after HD. Continue Amlodipine and Carvedilol for now. 4.  Anemia of chronic disease: Hgb now at goal. No indication for Fe and ESA at this time. 5.  Secondary hyperparathyroidism: Continue phosphorus binders and will restart his Cinacalcet and VDRA with dialysis. 6. Dispo - okay for discharge from renal standpoint. Patient can resume HD tomorrow at his outpatient kidney center.  CTobie Poet NP [facility] 05/24/2022,9:05 AM  LOS: 2 days

## 2022-05-24 NOTE — Progress Notes (Signed)
D/C order noted. Contacted [facility] to advise clinic of pt's d/c today and that pt should resume care tomorrow.   Melven Sartorius Renal Navigator [phone]

## 2022-07-17 ENCOUNTER — Ambulatory Visit (INDEPENDENT_AMBULATORY_CARE_PROVIDER_SITE_OTHER): Payer: Medicare Other | Admitting: Allergy & Immunology

## 2022-07-17 ENCOUNTER — Encounter: Payer: Self-pay | Admitting: Allergy & Immunology

## 2022-07-17 ENCOUNTER — Ambulatory Visit (INDEPENDENT_AMBULATORY_CARE_PROVIDER_SITE_OTHER): Payer: Medicare Other | Admitting: Podiatry

## 2022-07-17 ENCOUNTER — Other Ambulatory Visit: Payer: Self-pay

## 2022-07-17 VITALS — BP 162/98

## 2022-07-17 VITALS — BP 142/86 | HR 75 | Temp 97.4°F | Resp 18 | Ht 66.93 in | Wt 225.8 lb

## 2022-07-17 DIAGNOSIS — E119 Type 2 diabetes mellitus without complications: Secondary | ICD-10-CM

## 2022-07-17 DIAGNOSIS — J4540 Moderate persistent asthma, uncomplicated: Secondary | ICD-10-CM | POA: Diagnosis not present

## 2022-07-17 DIAGNOSIS — M2141 Flat foot [pes planus] (acquired), right foot: Secondary | ICD-10-CM

## 2022-07-17 DIAGNOSIS — B353 Tinea pedis: Secondary | ICD-10-CM | POA: Diagnosis not present

## 2022-07-17 DIAGNOSIS — M79676 Pain in unspecified toe(s): Secondary | ICD-10-CM

## 2022-07-17 DIAGNOSIS — E1142 Type 2 diabetes mellitus with diabetic polyneuropathy: Secondary | ICD-10-CM

## 2022-07-17 DIAGNOSIS — J310 Chronic rhinitis: Secondary | ICD-10-CM | POA: Diagnosis not present

## 2022-07-17 DIAGNOSIS — B351 Tinea unguium: Secondary | ICD-10-CM | POA: Diagnosis not present

## 2022-07-17 DIAGNOSIS — M2142 Flat foot [pes planus] (acquired), left foot: Secondary | ICD-10-CM

## 2022-07-17 MED ORDER — KETOCONAZOLE 2 % EX CREA: TOPICAL_CREAM | CUTANEOUS | 1 refills | Status: DC

## 2022-07-17 MED ORDER — FLUTICASONE-SALMETEROL 115-21 MCG/ACT IN AERO: 2.0000 | INHALATION_SPRAY | Freq: Two times a day (BID) | RESPIRATORY_TRACT | 5 refills | Status: DC

## 2022-07-17 MED ORDER — LEVOCETIRIZINE DIHYDROCHLORIDE 5 MG PO TABS: 5.0000 mg | ORAL_TABLET | Freq: Every evening | ORAL | 5 refills | Status: DC

## 2022-07-17 NOTE — Patient Instructions (Signed)
Athlete's Foot Athlete's foot (tinea pedis) is a fungal infection of the skin on your feet. It often occurs on the skin that is between or underneath the toes. It can also occur on the soles of your feet. The infection can spread from person to person (is contagious). It can also spread when a person's bare feet come in contact with the fungus on shower floors or on items such as shoes. What are the causes? This condition is caused by a fungus that grows in warm, moist places. You can get athlete's foot by sharing shoes, shower stalls, towels, and wet floors with someone who is infected. Not washing your feet or changing your socks often enough can also lead to athlete's foot. What increases the risk? This condition is more likely to develop in: Men. People who have a weak body defense system (immune system). People who have diabetes. People who use public showers, such as at a gym. People who wear heavy-duty shoes, such as industrial or military shoes. Seasons with warm, humid weather. What are the signs or symptoms? Symptoms of this condition include: Itchy areas between your toes or on the soles of your feet. White, flaky, or scaly areas between your toes or on the soles of your feet. Very itchy small blisters between your toes or on the soles of your feet. Small cuts in your skin. These cuts can become infected. Thick or discolored toenails. How is this diagnosed? This condition may be diagnosed with a physical exam and a review of your medical history. Your health care provider may also take a skin or toenail sample to examine under a microscope. How is this treated? This condition is treated with antifungal medicines. These may be applied as powders, ointments, or creams. In severe cases, an oral antifungal medicine may be given. Follow these instructions at home: Medicines Apply or take over-the-counter and prescription medicines only as told by your health care provider. Apply your  antifungal medicine as told by your health care provider. Do not stop using the antifungal even if your condition improves. Foot care Do not scratch your feet. Keep your feet dry: Wear cotton or wool socks. Change your socks every day or if they become wet. Wear shoes that allow air to flow, such as sandals or canvas tennis shoes. Wash and dry your feet, including the area between your toes. Also, wash and dry your feet: Every day or as told by your health care provider. After exercising. General instructions Do not let others use towels, shoes, nail clippers, or other personal items that touch your feet. Protect your feet by wearing sandals in wet areas, such as locker rooms and shared showers. Keep all follow-up visits. This is important. If you have diabetes, keep your blood sugar under control. Contact a health care provider if: You have a fever. You have swelling, soreness, warmth, or redness in your foot. Your feet are not getting better with treatment. Your symptoms get worse. You have new symptoms. You have severe pain. Summary Athlete's foot (tinea pedis) is a fungal infection of the skin on your feet. It often occurs on skin that is between or underneath the toes. This condition is caused by a fungus that grows in warm, moist places. Symptoms include white, flaky, or scaly areas between your toes or on the soles of your feet. This condition is treated with antifungal medicines. Keep your feet clean. Always dry them thoroughly. This information is not intended to replace advice given to you by   your health care provider. Make sure you discuss any questions you have with your health care provider. Document Revised: 07/17/2020 Document Reviewed: 07/17/2020 Elsevier Patient Education  2023 Elsevier Inc.  

## 2022-07-17 NOTE — Patient Instructions (Addendum)
1. Moderate persistent asthma, uncomplicated - Lung testing was in the mid 60% range and it did not improve with the nebulizer treatment. - I am not sure what to make of this, but since you did feel better on the Advair, we are going to get you back on this.  - Spacer sample and demonstration provided. - Daily controller medication(s): Advair 115/93mcg two puffs twice daily with spacer - Prior to physical activity: albuterol 2 puffs 10-15 minutes before physical activity. - Rescue medications: albuterol 4 puffs every 4-6 hours as needed - Asthma control goals:  * Full participation in all desired activities (may need albuterol before activity) * Albuterol use two time or less a week on average (not counting use with activity) * Cough interfering with sleep two time or less a month * Oral steroids no more than once a year * No hospitalizations  2. Chronic rhinitis - We could not do testing today because of your insurance.  - We are going to statr a medication called Xyzal to see if this helps control your allergy symptoms. - We will test you in 1-2 weeks.   3. Return in about 4 weeks (around 08/14/2022) for SKIN TESTING. You can have the follow up appointment with Dr. Dellis Anes or a Nurse Practicioner (our Nurse Practitioners are excellent and always have Physician oversight!).    Please inform us of any Emergency Department visits, hospitalizations, or changes in symptoms. Call us before going to the ED for breathing or allergy symptoms since we might be able to fit you in for a sick visit. Feel free to contact us anytime with any questions, problems, or concerns.  It was a pleasure to meet you and your family today!  Websites that have reliable patient information: 1. American Academy of Asthma, Allergy, and Immunology: www.aaaai.org 2. Food Allergy Research and Education (FARE): foodallergy.org 3. Mothers of Asthmatics: http://www.asthmacommunitynetwork.org 4. American College of  Allergy, Asthma, and Immunology: www.acaai.org   COVID-19 Vaccine Information can be found at: PodExchange.[email or web address] For questions related to vaccine distribution or appointments, please email [email or web address] or call [phone].   We realize that you might be concerned about having an allergic reaction to the COVID19 vaccines. To help with that concern, WE ARE OFFERING THE COVID19 VACCINES IN OUR OFFICE! Ask the front desk for dates!     "Like" [place] on Facebook and Instagram for our latest updates!      A healthy democracy works best when [employer] participate! Make sure you are registered to vote! If you have moved or changed any of your contact information, you will need to get this updated before voting!  In some cases, you MAY be able to register to vote online: [email or web address]

## 2022-07-17 NOTE — Progress Notes (Unsigned)
NEW PATIENT  Date of Service/Encounter:  07/17/22  Consult requested by: [name] AuBoyd, [name] Lamonica, MD   Assessment:   Moderate persistent asthma, uncomplicated  History of right-sided complicated pneumonia and effusion - s/p VATS and decortication   Chronic rhinitis - planning for testing at the next visit  Difficult to control hypertension  ESRD - on diaylsis and on the transplant list  Insulin-dependent diabetes mellitus  On beta blocker therapy   It is rather difficult to say what is causing his chronic cough.  I am tentatively labeling it as moderate persistent asthma, but I think we will know more after he has been on the Advair consistently at the next visit.  He does report that he was better with the Advair on board, but the history is not the greatest.  He certainly could have some residual restriction from his complicated pneumonia and effusion in 2019.  He also has a very complicated history including end-stage renal disease and hypertension.  We will work on adequately controlling his breathing status before doing significant the next visit.  Family in agreement with the plan.  Plan/Recommendations:   1. Moderate persistent asthma, uncomplicated - Lung testing was in the mid 60% range and it did not improve with the nebulizer treatment. - I am not sure what to make of this, but since you did feel better on the Advair, we are going to get you back on this.  - Spacer sample and demonstration provided. - Daily controller medication(s): Advair 115/1921mcg two puffs twice daily with spacer - Prior to physical activity: albuterol 2 puffs 10-15 minutes before physical activity. - Rescue medications: albuterol 4 puffs every 4-6 hours as needed - Asthma control goals:  * Full participation in all desired activities (may need albuterol before activity) * Albuterol use two time or less a week on average (not counting use with activity) * Cough interfering with sleep two time or  less a month * Oral steroids no more than once a year * No hospitalizations  2. Chronic rhinitis - We could not do testing today because of your insurance.  - We are going to start a medication called Xyzal to see if this helps control your allergy symptoms. - We will test you in 1-2 weeks.   3. Return in about 4 weeks (around 08/14/2022) for SKIN TESTING. You can have the follow up appointment with Dr. Dellis [name] or a Nurse Practicioner (our Nurse Practitioners are excellent and always have Physician oversight!).   This note in its entirety was forwarded to the Provider who requested this consultation.  Subjective:   [name] is a 57 y.o. male presenting today for evaluation of  Chief Complaint  Patient presents with   Cough    Constant coughing almost a year to this day - tried cough medication and acid reflux medication    Allergic Rhinitis          [name] has a history of the following: Patient Active Problem List   Diagnosis Date Noted   Acute hypoxemic respiratory failure 05/22/2022   Acute pulmonary edema 03/19/2022   Fluid overload 03/18/2022   Tinea pedis of both feet 11/22/2020   Enlarged heart 02/02/2020   Hospital discharge follow-up 01/12/2020   ESRD (end stage renal disease) 01/02/2020   Hypertensive urgency 10/25/2019   Chronic ulcer of right leg 10/25/2019   Right otitis media with effusion 10/25/2019   AF (paroxysmal atrial fibrillation) 10/25/2019   Bilateral lower extremity edema 10/06/2019  Eustachian tube dysfunction, right 10/06/2019   Chronic anticoagulation 12/11/2018   Elevated troponin 11/02/2018   Diabetic polyneuropathy associated with type 2 diabetes mellitus 11/13/2017   Acute renal failure superimposed on chronic kidney disease, on chronic dialysis 07/28/2017   Right sided Flank pain 07/27/2017   Weakness 07/27/2017   Myalgia 07/27/2017   Pneumonia of right lung due to infectious organism    Pleural effusion    Uncontrolled  hypertension    Post-operative pain    Type 2 diabetes mellitus with hyperlipidemia    Acute on chronic diastolic heart failure    Encephalopathy    Stage 4 chronic kidney disease    SIRS (systemic inflammatory response syndrome)    Chest tube in place    Empyema lung    Postoperative pain    Pleural effusion on right 05/17/2017   Empyema 05/16/2017   Chronic depression 09/20/2015   Recurrent major depressive disorder, in partial remission 09/20/2015   Microalbuminuria 01/20/2015   Acute kidney injury 12/28/2014   Type 2 diabetes mellitus with hyperglycemia, with long-term current use of insulin 12/28/2014   DM (diabetes mellitus) type II controlled, neurological manifestation (HCC) 12/28/2014   Adjustment disorder with mixed anxiety and depressed mood 12/28/2014   Possible Panic disorder 12/28/2014   Hypertensive emergency without congestive heart failure 12/27/2014   Anxiety 12/23/2014   Class 2 obesity 05/14/2012   Sleep apnea 05/14/2012   Severe uncontrolled hypertension 03/28/2011    History obtained from: chart review and patient and mother.  [name] was referred by [name] Au, MD.     Primary CARE provider: Virl Son, MD Cardiology: [name] Speck, MD Transplant surgeon: [name] Sails, MD Nephrologist: [name] Cake, MD   [name] is a 57 y.o. male presenting for an evaluation of shortness of breath . [place]. [name] is originally from somewhere near [place]. He was born in [place] and worked in [place] for a period of time before moving here.  He has a cough that started when he contracted PNA around 8-9 months. He was in the hospital at the time. He has had PNA around 3 times in his lifetime. [name] is here today and denies that was a sickly child. He was starting to get sick in his 30s. He thinks that this was around the same time that he had hypertension. At the end of the visit, he shares a story of some kind of chest procedure he had  shortly after moving there in 2017 and thinks that the coughing started at this time.  He notes that he was admitted to the hospital in February 2024 for hypoxia and pulmonary edema.  He had 5 L of fluid emergently dialyzed.  His breathing status improved.  It looks like his Advair was discontinued because they had not been picked up from the pharmacy.   Asthma/Respiratory Symptom History: He has bene on several inahlers in the past. Albuterol has been helpful. He does not have an up to date inaherl right now. He was removed from this in December. He is unsure why he was taken off of it.  He reports feeling SOB daily but it depends on what he is doing. Physical activity might make it worse, especially with carrying something or gong up steps. He was on Advair for a time. He was using it and felt better. But they removed it when he was in the hospital. IT is unclear why this was done. Coughing has worsened a bit since  stopping the Advair.   Review of his history shows several ED visits and hospitalizations for hypertensive urgencies and renal disease. There is an ED visits in 2010 for chest pain and an ED visit in 2012 for respiratory distress. This was at [facility] in [place]. There is an admission in March 2015 at [facility] for DKA. So I think that he was living in [place] earlier than he reported today (he told me that he moved here from [place] in 2017). There are several other admissions shortly thereafter for hyperglycemia. I do see an admission in February 2019 for a pleural effusion. He did have a chest tube placed for drainage of a parapneumonic effusion following a complicated right-sided pneumonia.  However, there is no mention of an asthma history.  He does have albuterol but was discharged on this from that visit, which might be the first time that he was ever prescribed albuterol.  Allergic Rhinitis Symptom History: He does have a lot of ocular itching. He has a history ofo a  blood clot in the left eye with fluid building. He gets injections. He sees Dr. Maeola Sarah at [facility]. He has not tried antihistamines. He has tried some eye drops. He has never been allergy tested in the past.   GERD Symptom History: He is on Protonix. This seems to be working well to control his symptoms.   He has ESRD. He is on dialysis three times weekly. He started doing dialysis around 3 years ago.   Otherwise, there is no history of other atopic diseases, including drug allergies, stinging insect allergies, or contact dermatitis. There is no significant infectious history. Vaccinations are up to date.    Past Medical History: Patient Active Problem List   Diagnosis Date Noted   Acute hypoxemic respiratory failure 05/22/2022   Acute pulmonary edema 03/19/2022   Fluid overload 03/18/2022   Tinea pedis of both feet 11/22/2020   Enlarged heart 02/02/2020   Hospital discharge follow-up 01/12/2020   ESRD (end stage renal disease) 01/02/2020   Hypertensive urgency 10/25/2019   Chronic ulcer of right leg 10/25/2019   Right otitis media with effusion 10/25/2019   AF (paroxysmal atrial fibrillation) 10/25/2019   Bilateral lower extremity edema 10/06/2019   Eustachian tube dysfunction, right 10/06/2019   Chronic anticoagulation 12/11/2018   Elevated troponin 11/02/2018   Diabetic polyneuropathy associated with type 2 diabetes mellitus 11/13/2017   Acute renal failure superimposed on chronic kidney disease, on chronic dialysis 07/28/2017   Right sided Flank pain 07/27/2017   Weakness 07/27/2017   Myalgia 07/27/2017   Pneumonia of right lung due to infectious organism    Pleural effusion    Uncontrolled hypertension    Post-operative pain    Type 2 diabetes mellitus with hyperlipidemia    Acute on chronic diastolic heart failure    Encephalopathy    Stage 4 chronic kidney disease    SIRS (systemic inflammatory response syndrome)    Chest tube in place     Empyema lung    Postoperative pain    Pleural effusion on right 05/17/2017   Empyema 05/16/2017   Chronic depression 09/20/2015   Recurrent major depressive disorder, in partial remission 09/20/2015   Microalbuminuria 01/20/2015   Acute kidney injury 12/28/2014   Type 2 diabetes mellitus with hyperglycemia, with long-term current use of insulin 12/28/2014   DM (diabetes mellitus) type II controlled, neurological manifestation (HCC) 12/28/2014   Adjustment disorder with mixed anxiety and depressed mood 12/28/2014  Possible Panic disorder 12/28/2014   Hypertensive emergency without congestive heart failure 12/27/2014   Anxiety 12/23/2014   Class 2 obesity 05/14/2012   Sleep apnea 05/14/2012   Severe uncontrolled hypertension 03/28/2011    Medication List:  Allergies as of 07/17/2022   No Known Allergies      Medication List        Accurate as of July 17, 2022 12:25 PM. If you have any questions, ask your nurse or doctor.          Accu-Chek Softclix Lancets lancets Use as directed.  For insulin-dependent type 2 diabetes Monitor  glucose 3 times a day   acetaminophen 500 MG tablet Commonly known as: TYLENOL Take 1,000-1,500 mg by mouth 2 (two) times daily as needed (pain).   amLODipine 10 MG tablet Commonly known as: NORVASC Take 1 tablet (10 mg total) by mouth daily.   BD Pen Needle Nano U/F 32G X 4 MM Misc Generic drug: Insulin Pen Needle Use as directed up to four times daily   benzonatate 100 MG capsule Commonly known as: TESSALON Take 100 mg by mouth 3 (three) times daily as needed for cough.   calcitRIOL 0.25 MCG capsule Commonly known as: ROCALTROL Take 1 capsule (0.25 mcg total) by mouth every Monday, Wednesday, and Friday.   carvedilol 25 MG tablet Commonly known as: COREG Take 1 tablet (25 mg total) by mouth 2 (two) times daily with a meal.   cetirizine 10 MG tablet Commonly known as: ZYRTEC Take 10 mg by mouth daily.   fluticasone-salmeterol  115-21 MCG/ACT inhaler Commonly known as: Advair HFA Inhale 2 puffs into the lungs 2 (two) times daily. Started by: Alfonse Spruce, MD   gabapentin 300 MG capsule Commonly known as: NEURONTIN Take 1 capsule (300 mg total) by mouth daily.   glucose blood test strip Use as instructed For insulin-dependent type 2 diabetes Monitor  glucose 3 times a day   HumaLOG Mix 75/25 KwikPen (75-25) 100 UNIT/ML Kwikpen Generic drug: Insulin Lispro Prot & Lispro Inject 5-10 Units into the skin 2 (two) times daily with a meal.   levocetirizine 5 MG tablet Commonly known as: XYZAL Take 1 tablet (5 mg total) by mouth every evening. Started by: Alfonse Spruce, MD   multivitamin Tabs tablet Take 1 tablet by mouth at bedtime.   pantoprazole 40 MG tablet Commonly known as: PROTONIX Take 1 tablet (40 mg total) by mouth daily.   sevelamer carbonate 800 MG tablet Commonly known as: RENVELA Take 2-3 tablets (1,600-2,400 mg total) by mouth 3 (three) times daily with meals.   tiZANidine 4 MG tablet Commonly known as: ZANAFLEX Take 1 tablet (4 mg total) by mouth every 8 (eight) hours as needed for muscle spasms.   Vitamin D (Ergocalciferol) 1.25 MG (50000 UNIT) Caps capsule Commonly known as: DRISDOL Take 50,000 Units by mouth every Sunday.        Birth History: non-contributory  Developmental History: non-contributory  Past Surgical History: Past Surgical History:  Procedure Laterality Date   AV FISTULA PLACEMENT Left 01/05/2020   Procedure: LEFT ARM BRACHIOCEPHALIC ARTERIOVENOUS (AV) FISTULA;  Surgeon: Chuck Hint, MD;  Location: [facility] OR;  Service: Vascular;  Laterality: Left;   EMPYEMA DRAINAGE Right 05/17/2017   Procedure: EMPYEMA DRAINAGE AND DECORTICATION;  Surgeon: Delight Ovens, MD;  Location[facility] OR;  Service: Thoracic;  Laterality: Right;   FLEXIBLE BRONCHOSCOPY  05/17/2017   Procedure: FLEXIBLE BRONCHOSCOPY;  Surgeon: Delight Ovens, MD;  Location: [facility]  OR;  Service: Thoracic;;   INSERTION OF DIALYSIS CATHETER Right 12/12/2021   Procedure: INSERTION OF RIGHT INTERNAL JUGULAR DIALYSIS CATHETER;  Surgeon: Chuck Hint, MD;  Location: [facility] OR;  Service: Vascular;  Laterality: Right;   IR FLUORO GUIDE CV LINE RIGHT  01/01/2020   IR THORACENTESIS ASP PLEURAL SPACE W/IMG GUIDE  05/17/2017   IR US GUIDE VASC ACCESS RIGHT  01/01/2020   NO PAST SURGERIES     REVISON OF ARTERIOVENOUS FISTULA Left 12/12/2021   Procedure: REVISION OF LEFT ARM FISTULA BY PLICATION;  Surgeon: Chuck Hint, MD;  Location: [facility] OR;  Service: Vascular;  Laterality: Left;   THORACOTOMY/LOBECTOMY Right 05/17/2017   Procedure: RIGHT MINI THORACOTOMY;  Surgeon: Delight Ovens, MD;  Location: [facility] OR;  Service: Thoracic;  Laterality: Right;   VIDEO ASSISTED THORACOSCOPY (VATS)/EMPYEMA Right 05/17/2017   Procedure: RIGHT VIDEO ASSISTED THORACOSCOPY (VATS)/EMPYEMA;  Surgeon: Delight Ovens, MD;  Location: [facility] OR;  Service: Thoracic;  Laterality: Right;     Family History: Family History  Problem Relation Age of Onset   Hypertension Mother      Social History: Glennie lives at home with his family.  They live in a house.  There is no water damage.  They have gas heating and central cooling.  There is a dog inside of the home.  There is no tobacco exposure.  There is no HEPA filter.  There is no fume, chemical, or dust exposure.  He has never been a smoker, but was exposed to secondhand smoke from his mother-in-law.   Review of Systems  Constitutional: Negative.  Negative for chills, fever, malaise/fatigue and weight loss.  HENT:  Positive for congestion. Negative for ear discharge, ear pain and sinus pain.   Eyes:  Negative for pain, discharge and redness.  Respiratory:  Positive for cough, sputum production and shortness of breath. Negative for wheezing.   Cardiovascular: Negative.  Negative for chest pain and palpitations.  Gastrointestinal:  Negative for  abdominal pain, constipation, diarrhea, heartburn, nausea and vomiting.  Skin: Negative.  Negative for itching and rash.  Neurological:  Negative for dizziness and headaches.  Endo/Heme/Allergies:  Positive for environmental allergies. Does not bruise/bleed easily.       Objective:   Blood pressure (!) 142/86, pulse 75, temperature (!) 97.4 F (36.3 C), resp. rate 18, height 5' 6.93" (1.7 m), weight 225 lb 12.8 oz (102.4 kg), SpO2 99 %. Body mass index is 35.44 kg/m.     Physical Exam Vitals reviewed.  Constitutional:      Appearance: He is well-developed.  HENT:     Head: Normocephalic and atraumatic.     Right Ear: Tympanic membrane, ear canal and external ear normal. No drainage, swelling or tenderness. Tympanic membrane is not injected, scarred, erythematous, retracted or bulging.     Left Ear: Tympanic membrane, ear canal and external ear normal. No drainage, swelling or tenderness. Tympanic membrane is not injected, scarred, erythematous, retracted or bulging.     Nose: No nasal deformity, septal deviation, mucosal edema or rhinorrhea.     Right Turbinates: Enlarged, swollen and pale.     Left Turbinates: Enlarged, swollen and pale.     Right Sinus: No maxillary sinus tenderness or frontal sinus tenderness.     Left Sinus: No maxillary sinus tenderness or frontal sinus tenderness.     Mouth/Throat:     Lips: Pink.     Mouth: Mucous membranes are moist. Mucous membranes are not pale and not dry.  Pharynx: Uvula midline.  Eyes:     General:        Right eye: No discharge.        Left eye: No discharge.     Conjunctiva/sclera: Conjunctivae normal.     Right eye: Right conjunctiva is not injected. No chemosis.    Left eye: Left conjunctiva is not injected. No chemosis.    Pupils: Pupils are equal, round, and reactive to light.  Cardiovascular:     Rate and Rhythm: Normal rate and regular rhythm.     Heart sounds: Normal heart sounds.  Pulmonary:     Effort:  Pulmonary effort is normal. No tachypnea, accessory muscle usage or respiratory distress.     Breath sounds: Examination of the right-upper field reveals decreased breath sounds and rhonchi. Examination of the left-upper field reveals decreased breath sounds and rhonchi. Examination of the right-middle field reveals decreased breath sounds and rhonchi. Examination of the left-middle field reveals decreased breath sounds and rhonchi. Examination of the right-lower field reveals rhonchi. Examination of the left-lower field reveals rhonchi. Decreased breath sounds and rhonchi present. No wheezing or rales.  Chest:     Chest wall: No tenderness.  Abdominal:     Tenderness: There is no abdominal tenderness. There is no guarding or rebound.  Lymphadenopathy:     Head:     Right side of head: No submandibular, tonsillar or occipital adenopathy.     Left side of head: No submandibular, tonsillar or occipital adenopathy.     Cervical: No cervical adenopathy.  Skin:    Coloration: Skin is not pale.     Findings: No abrasion, erythema, petechiae or rash. Rash is not papular, urticarial or vesicular.  Neurological:     Mental Status: He is alert.  Psychiatric:        Behavior: Behavior is cooperative.      Diagnostic studies:    Spirometry: results abnormal (FEV1: 1.70/61%, FVC: 2.24/64%, FEV1/FVC: 76%).    Spirometry consistent with possible restrictive disease. Albuterol/Atrovent nebulizer treatment given in clinic with no improvement in values at all.  Allergy Studies: none          Malachi Bonds, MD [facility]

## 2022-07-22 ENCOUNTER — Encounter: Payer: Self-pay | Admitting: Podiatry

## 2022-07-22 NOTE — Progress Notes (Signed)
Subjective: [name] presents today for diabetic foot evaluation.  Patient relates 5 year h/o diabetes.  Patient denies any h/o foot wounds.  Patient has been diagnosed with neuropathy.  Risk factors: diabetes, neuropathy, history of foot/leg ulcer, HTN, CHF, hyperlipidemia, diabetic renal disease.  PCP is Verlon Au, MD.  Past Medical History:  Diagnosis Date   Acute kidney injury 12/28/2014   Adjustment disorder with mixed anxiety and depressed mood 12/28/2014   CHF (congestive heart failure)    CPAP (continuous positive airway pressure) dependence    Diabetes mellitus without complication    DM (diabetes mellitus) type II controlled with renal manifestation 12/28/2014   DM (diabetes mellitus) type II controlled, neurological manifestation 12/28/2014   Encephalopathy, hypertensive 12/28/2014   GERD (gastroesophageal reflux disease)    Hypertension    Hypertensive emergency without congestive heart failure 12/27/2014   Pneumonia    Possible Panic disorder 12/28/2014   Renal disorder    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   Sleep apnea     Patient Active Problem List   Diagnosis Date Noted   Acute hypoxemic respiratory failure 05/22/2022   Acute pulmonary edema 03/19/2022   Fluid overload 03/18/2022   Tinea pedis of both feet 11/22/2020   Enlarged heart 02/02/2020   Hospital discharge follow-up 01/12/2020   ESRD (end stage renal disease) 01/02/2020   Hypertensive urgency 10/25/2019   Chronic ulcer of right leg 10/25/2019   Right otitis media with effusion 10/25/2019   AF (paroxysmal atrial fibrillation) 10/25/2019   Bilateral lower extremity edema 10/06/2019   Eustachian tube dysfunction, right 10/06/2019   Chronic anticoagulation 12/11/2018   Elevated troponin 11/02/2018   Diabetic polyneuropathy associated with type 2 diabetes mellitus 11/13/2017   Acute renal failure superimposed on chronic kidney disease, on chronic dialysis 07/28/2017   Right  sided Flank pain 07/27/2017   Weakness 07/27/2017   Myalgia 07/27/2017   Pneumonia of right lung due to infectious organism    Pleural effusion    Uncontrolled hypertension    Post-operative pain    Type 2 diabetes mellitus with hyperlipidemia    Acute on chronic diastolic heart failure    Encephalopathy    Stage 4 chronic kidney disease    SIRS (systemic inflammatory response syndrome)    Chest tube in place    Empyema lung    Postoperative pain    Pleural effusion on right 05/17/2017   Empyema 05/16/2017   Chronic depression 09/20/2015   Recurrent major depressive disorder, in partial remission 09/20/2015   Microalbuminuria 01/20/2015   Acute kidney injury 12/28/2014   Type 2 diabetes mellitus with hyperglycemia, with long-term current use of insulin 12/28/2014   DM (diabetes mellitus) type II controlled, neurological manifestation (HCC) 12/28/2014   Adjustment disorder with mixed anxiety and depressed mood 12/28/2014   Possible Panic disorder 12/28/2014   Hypertensive emergency without congestive heart failure 12/27/2014   Anxiety 12/23/2014   Class 2 obesity 05/14/2012   Sleep apnea 05/14/2012   Class 2 severe obesity due to excess calories with serious comorbidity and body mass index (BMI) of 38.0 to 38.9 in adult 05/14/2012   Severe uncontrolled hypertension 03/28/2011    Past Surgical History:  Procedure Laterality Date   AV FISTULA PLACEMENT Left 01/05/2020   Procedure: LEFT ARM BRACHIOCEPHALIC ARTERIOVENOUS (AV) FISTULA;  Surgeon: Chuck Hint, MD;  Location: [facility] OR;  Service: Vascular;  Laterality: Left;   EMPYEMA DRAINAGE Right 05/17/2017   Procedure: EMPYEMA DRAINAGE AND DECORTICATION;  Surgeon: Delight Ovens, MD;  Location: [facility] OR;  Service: Thoracic;  Laterality: Right;   FLEXIBLE BRONCHOSCOPY  05/17/2017   Procedure: FLEXIBLE BRONCHOSCOPY;  Surgeon: Delight Ovens, MD;  Location: [facility] OR;  Service: Thoracic;;   INSERTION OF DIALYSIS CATHETER Right  12/12/2021   Procedure: INSERTION OF RIGHT INTERNAL JUGULAR DIALYSIS CATHETER;  Surgeon: Chuck Hint, MD;  Location: [facility] OR;  Service: Vascular;  Laterality: Right;   IR FLUORO GUIDE CV LINE RIGHT  01/01/2020   IR THORACENTESIS ASP PLEURAL SPACE W/IMG GUIDE  05/17/2017   IR US GUIDE VASC ACCESS RIGHT  01/01/2020   NO PAST SURGERIES     REVISON OF ARTERIOVENOUS FISTULA Left 12/12/2021   Procedure: REVISION OF LEFT ARM FISTULA BY PLICATION;  Surgeon: Chuck Hint, MD;  Location: [facility] OR;  Service: Vascular;  Laterality: Left;   THORACOTOMY/LOBECTOMY Right 05/17/2017   Procedure: RIGHT MINI THORACOTOMY;  Surgeon: Delight Ovens, MD;  Location: [facility] OR;  Service: Thoracic;  Laterality: Right;   VIDEO ASSISTED THORACOSCOPY (VATS)/EMPYEMA Right 05/17/2017   Procedure: RIGHT VIDEO ASSISTED THORACOSCOPY (VATS)/EMPYEMA;  Surgeon: Delight Ovens, MD;  Location: [facility] OR;  Service: Thoracic;  Laterality: Right;    Current Outpatient Medications on File Prior to Visit  Medication Sig Dispense Refill   Accu-Chek Softclix Lancets lancets Use as directed.  For insulin-dependent type 2 diabetes Monitor  glucose 3 times a day 100 each 0   acetaminophen (TYLENOL) 500 MG tablet Take 1,000-1,500 mg by mouth 2 (two) times daily as needed (pain).     amLODipine (NORVASC) 10 MG tablet Take 1 tablet (10 mg total) by mouth daily. 30 tablet 0   benzonatate (TESSALON) 100 MG capsule Take 100 mg by mouth 3 (three) times daily as needed for cough.     calcitRIOL (ROCALTROL) 0.25 MCG capsule Take 1 capsule (0.25 mcg total) by mouth every Monday, Wednesday, and Friday. 12 capsule 0   carvedilol (COREG) 25 MG tablet Take 1 tablet (25 mg total) by mouth 2 (two) times daily with a meal. 60 tablet 0   cetirizine (ZYRTEC) 10 MG tablet Take 10 mg by mouth daily.     gabapentin (NEURONTIN) 300 MG capsule Take 1 capsule (300 mg total) by mouth daily. 30 capsule 0   glucose blood test strip Use as instructed For  insulin-dependent type 2 diabetes Monitor  glucose 3 times a day 100 each 0   Insulin Lispro Prot & Lispro (HUMALOG 75/25 MIX) (75-25) 100 UNIT/ML Kwikpen Inject 5-10 Units into the skin 2 (two) times daily with a meal. 15 mL 11   Insulin Pen Needle 32G X 4 MM MISC Use as directed up to four times daily 100 each 0   multivitamin (RENA-VIT) TABS tablet Take 1 tablet by mouth at bedtime. 30 tablet 0   pantoprazole (PROTONIX) 40 MG tablet Take 1 tablet (40 mg total) by mouth daily. 30 tablet 0   sevelamer carbonate (RENVELA) 800 MG tablet Take 2-3 tablets (1,600-2,400 mg total) by mouth 3 (three) times daily with meals. 270 tablet 0   tiZANidine (ZANAFLEX) 4 MG tablet Take 1 tablet (4 mg total) by mouth every 8 (eight) hours as needed for muscle spasms. 90 tablet 0   Vitamin D, Ergocalciferol, (DRISDOL) 1.25 MG (50000 UNIT) CAPS capsule Take 50,000 Units by mouth every Sunday.     [DISCONTINUED] insulin aspart protamine - aspart (NOVOLOG MIX 70/30 FLEXPEN) (70-30) 100 UNIT/ML FlexPen Inject 15-30 Units into the skin 2 (two) times daily.     No current  facility-administered medications on file prior to visit.     No Known Allergies  Social History   Occupational History   Not on file  Tobacco Use   Smoking status: Never    Passive exposure: Never   Smokeless tobacco: Never  Vaping Use   Vaping Use: Never used  Substance and Sexual Activity   Alcohol use: No   Drug use: No   Sexual activity: Not on file    Family History  Problem Relation Age of Onset   Hypertension Mother     Immunization History  Administered Date(s) Administered   Hepatitis B, PED/ADOLESCENT 01/27/2020   Influenza Split 01/11/2016   Influenza, Quadrivalent, Recombinant, Inj, Pf 01/08/2020   Influenza,inj,Quad PF,6+ Mos 12/28/2014, 01/11/2016, 01/24/2017, 02/21/2018, 01/22/2019   Influenza-Unspecified 12/28/2014   PFIZER(Purple Top)SARS-COV-2 Vaccination 06/22/2019, 07/14/2019, 04/12/2020   Pneumococcal  Polysaccharide-23 06/27/2013, 01/07/2015    Objective: Vitals:   07/17/22 1406  BP: (!) 162/98    [name] is a pleasant 57 y.o. male obese in NAD. AAO X 3.  Vascular Examination: Capillary refill time immediate b/l. Vascular status intact b/l with palpable pedal pulses. Pedal hair present b/l. No edema. No pain with calf compression b/l. Skin temperature gradient WNL b/l. No ischemia or gangrene noted b/l LE. No cyanosis or clubbing noted b/l LE.  Neurological Examination: Sensation grossly intact b/l with 10 gram monofilament. Vibratory sensation intact b/l. Protective sensation intact 5/5 intact bilaterally with 10g monofilament b/l. Vibratory sensation intact b/l.  Dermatological Examination: Pedal skin with normal turgor, texture and tone b/l.  No open wounds. No interdigital macerations.   Toenails 1-5 b/l thick, discolored, elongated with subungual debris and pain on dorsal palpation.   Pedal skin is warm and supple b/l LE. No open wounds b/l LE. No interdigital macerations noted b/l LE. Toenails 1-5 b/l elongated, discolored, dystrophic, thickened, crumbly with subungual debris and tenderness to dorsal palpation. Diffuse scaling noted peripherally and plantarly b/l feet.  No interdigital macerations.  No blisters, no weeping. No signs of secondary bacterial infection noted.  Musculoskeletal Examination: Normal muscle strength 5/5 to all lower extremity muscle groups bilaterally. Pes planus deformity noted bilateral LE.Marland Kitchen No pain, crepitus or joint limitation noted with ROM b/l LE.  Patient ambulates independently without assistive aids.  Radiographs: None  Last A1c:      Latest Ref Rng & Units 05/23/2022    2:54 AM  Hemoglobin A1C  Hemoglobin-A1c 4.8 - 5.6 % 6.4    Footwear Assessment: Does the patient wear appropriate shoes? Yes. Does the patient need inserts/orthotics? No.  Lab Results  Component Value Date   HGBA1C 6.4 (H) 05/23/2022   Assessment: 1. Pain  due to onychomycosis of toenail   2. Tinea pedis of both feet   3. Pes planus of both feet   4. Diabetic polyneuropathy associated with type 2 diabetes mellitus   5. Encounter for diabetic foot exam     ADA Risk Categorization: Low Risk:  Patient has all of the following: Intact protective sensation No prior foot ulcer  No severe deformity Pedal pulses present  Plan: -Patient was evaluated and treated. All patient's and/or POA's questions/concerns answered on today's visit. -Diabetic foot examination performed today. -Discussed and educated patient on diabetic foot care, especially with  regards to the vascular, neurological and musculoskeletal systems. -Toenails 1-5 b/l were debrided in length and girth with sterile nail nippers and dremel without iatrogenic bleeding.  -For tinea pedis, Rx sent to pharmacy for Ketoconazole Cream 2% to be  applied once daily for six weeks. -Patient/POA to call should there be question/concern in the interim.  Return in about 3 months (around 10/16/2022).  Freddie Breech, DPM

## 2022-07-29 NOTE — Patient Instructions (Incomplete)
1. Moderate persistent asthma, uncomplicated - Daily controller medication(s): Increase Advair 115/22mcg two puffs twice daily with spacer. Reviewed proper technique. See picture below - Prior to physical activity: albuterol 2 puffs 10-15 minutes before physical activity. - Rescue medications: albuterol 4 puffs every 4-6 hours as needed for cough, wheeze, tightness in chest, or shortness of breath OR albuterol via nebulizer using 1 unit dose every 4-6 hours as needed for cough, wheeze, tightness in chest, or shortness of breath. Refill of albuterol for nebulizer sent  - Asthma control goals:  * Full participation in all desired activities (may need albuterol before activity) * Albuterol use two time or less a week on average (not counting use with activity) * Cough interfering with sleep two time or less a month * Oral steroids no more than once a year * No hospitalizations    2. Chronic rhinitis - skin testing today was not done due to frequent albuterol use with symptoms - Continue Xyzal to see if this helps control your allergy symptoms. - start avoidance measures as below   3. Schedule an appointment in 4-6 weeks with [facility] skin testing to environmental alleriges. Remember to remain off all antihistamines 3 days prior to this appointment

## 2022-07-31 ENCOUNTER — Ambulatory Visit (INDEPENDENT_AMBULATORY_CARE_PROVIDER_SITE_OTHER): Payer: Medicare Other | Admitting: Family

## 2022-07-31 ENCOUNTER — Encounter: Payer: Self-pay | Admitting: Family

## 2022-07-31 VITALS — BP 150/90 | HR 80 | Temp 97.7°F | Resp 16 | Wt 229.0 lb

## 2022-07-31 DIAGNOSIS — J4540 Moderate persistent asthma, uncomplicated: Secondary | ICD-10-CM

## 2022-07-31 DIAGNOSIS — J310 Chronic rhinitis: Secondary | ICD-10-CM

## 2022-07-31 MED ORDER — ALBUTEROL SULFATE (2.5 MG/3ML) 0.083% IN NEBU: 2.5000 mg | INHALATION_SOLUTION | Freq: Four times a day (QID) | RESPIRATORY_TRACT | 1 refills | Status: DC | PRN

## 2022-07-31 NOTE — Progress Notes (Signed)
[address] [place] [address] Dept: [phone]  FOLLOW UP NOTE  Patient ID: [name], male    DOB: Feb 02, 1966  Age: 57 y.o. MRN: [id number] Date of Office Visit: 07/31/2022  Assessment  Chief Complaint: Allergy Testing (environmental)  HPI [name] is a 57 year old male who presents today for skin testing to environmental allergens.  He was last seen on July 17, 2022 by Dr. Dellis Anes for moderate persistent asthma and chronic rhinitis.  He has a history of right-sided complicated pneumonia and effusion-status post VATS and decortication, difficult to control hypertension,  end-stage renal disease-on dialysis and on the transplant list, insulin-dependent diabetes mellitus, and on beta-blocker therapy.  He denies any new diagnosis or surgery since his last office visit.  His mom is here with him today and helps provide history.  Moderate persistent asthma: There appears to be some confusion as to how frequently he should be using his Advair 115/21 mcg inhaler.  He first reports that he was doing 4 puffs and then changed to 1 puff twice a day.  He reports since starting Advair that he is still coughing when around certain things such as certain seasonings or certain smells.  The cough does wake him up at night and he does have shortness of breath when coughing a lot.  He also has tightness in his chest after coughing a lot.  He denies fever,chills, and wheezing.   He has been using his albuterol inhaler once or twice a day.  He then said that he basically uses his albuterol when he has dialysis and has to wear a mask and lay back in the chair.  He is requesting albuterol for his nebulizer.  He reports the cough started after the first time he had pneumonia.  He cannot remember when this was.  His mom thinks that his cough started after having surgery due to the infection on his back/side in 2017.  Since his last office visit he has not made any trips to the emergency room or urgent  care due to breathing problems and has not required any systemic steroids.  Chronic rhinitis: He is currently taking Xyzal and reports that this does not make him sleepy.  He denies rhinorrhea, nasal congestion, and postnasal drip.  He has not had any sinus infections since we last saw him.     Drug Allergies:  No Known Allergies  Review of Systems: Review of Systems  Constitutional:  Negative for chills and fever.  HENT:         Denies rhinorrhea, nasal congestion, and postnasal drip  Eyes:        Reports itchy watery eyes sometimes  Respiratory:  Positive for cough and shortness of breath. Negative for wheezing.        Reports cough and shortness of breath.  The cough does wake him up at night.  Denies wheezing and tightness in chest  Cardiovascular:  Negative for chest pain and palpitations.  Gastrointestinal:        Denies heartburn and reflux symptoms.  His mom reports that he was given medications for this in the past and it did not work.  His mom also reports that his primary care physician told him that he did not have reflux.  Skin:  Positive for itching. Negative for rash.       Reports itchy skin sometimes  Neurological:  Negative for headaches.     Physical Exam: BP (!) 150/90   Pulse 80   Temp  97.7 F (36.5 C) (Temporal)   Resp 16   Wt 229 lb (103.9 kg)   SpO2 95%   BMI 35.94 kg/m    Physical Exam Exam conducted with a chaperone present.  Constitutional:      Appearance: Normal appearance.  HENT:     Head: Normocephalic and atraumatic.     Comments: Pharynx normal. Eyes normal. Ears normal. Nose normal    Right Ear: Tympanic membrane, ear canal and external ear normal.     Left Ear: Tympanic membrane, ear canal and external ear normal.     Nose: Nose normal.     Mouth/Throat:     Mouth: Mucous membranes are moist.     Pharynx: Oropharynx is clear.  Eyes:     Conjunctiva/sclera: Conjunctivae normal.  Cardiovascular:     Rate and Rhythm: Regular  rhythm.     Heart sounds: Normal heart sounds.  Pulmonary:     Effort: Pulmonary effort is normal.     Breath sounds: Normal breath sounds.  Musculoskeletal:     Cervical back: Neck supple.  Skin:    General: Skin is warm.  Neurological:     Mental Status: He is alert and oriented to person, place, and time.  Psychiatric:        Mood and Affect: Mood normal.        Behavior: Behavior normal.     Diagnostics:  none  Assessment and Plan: 1. Moderate persistent asthma, uncomplicated   2. Chronic rhinitis     Meds ordered this encounter  Medications   albuterol (PROVENTIL) (2.5 MG/3ML) 0.083% nebulizer solution    Sig: Take 3 mLs (2.5 mg total) by nebulization every 6 (six) hours as needed for wheezing or shortness of breath.    Dispense:  75 mL    Refill:  1    Patient Instructions  1. Moderate persistent asthma, uncomplicated - Daily controller medication(s): Increase Advair 115/37mcg two puffs twice daily with spacer. Reviewed proper technique. See picture below - Prior to physical activity: albuterol 2 puffs 10-15 minutes before physical activity. - Rescue medications: albuterol 4 puffs every 4-6 hours as needed for cough, wheeze, tightness in chest, or shortness of breath OR albuterol via nebulizer using 1 unit dose every 4-6 hours as needed for cough, wheeze, tightness in chest, or shortness of breath. Refill of albuterol for nebulizer sent  - Asthma control goals:  * Full participation in all desired activities (may need albuterol before activity) * Albuterol use two time or less a week on average (not counting use with activity) * Cough interfering with sleep two time or less a month * Oral steroids no more than once a year * No hospitalizations    2. Chronic rhinitis - skin testing today was not done due to frequent albuterol use with symptoms - Continue Xyzal to see if this helps control your allergy symptoms. - start avoidance measures as below   3.  Schedule an appointment in 4-6 weeks with [facility] skin testing to environmental alleriges. Remember to remain off all antihistamines 3 days prior to this appointment        Return in about 4 weeks (around 08/28/2022), or if symptoms worsen or fail to improve, for skin testing-environment.    Thank you for the opportunity to care for this patient.  Please do not hesitate to contact me with questions.  Nehemiah Settle, FNP [facility]

## 2022-08-06 IMAGING — CR DG CHEST 2V
2 series · 2 of 2 positions shown · non-contrast
Comparison: Previous studies including the examination of
12/30/2019

CLINICAL DATA: Chest pain, cough

EXAM:
CHEST - 2 VIEW

[chest pa]
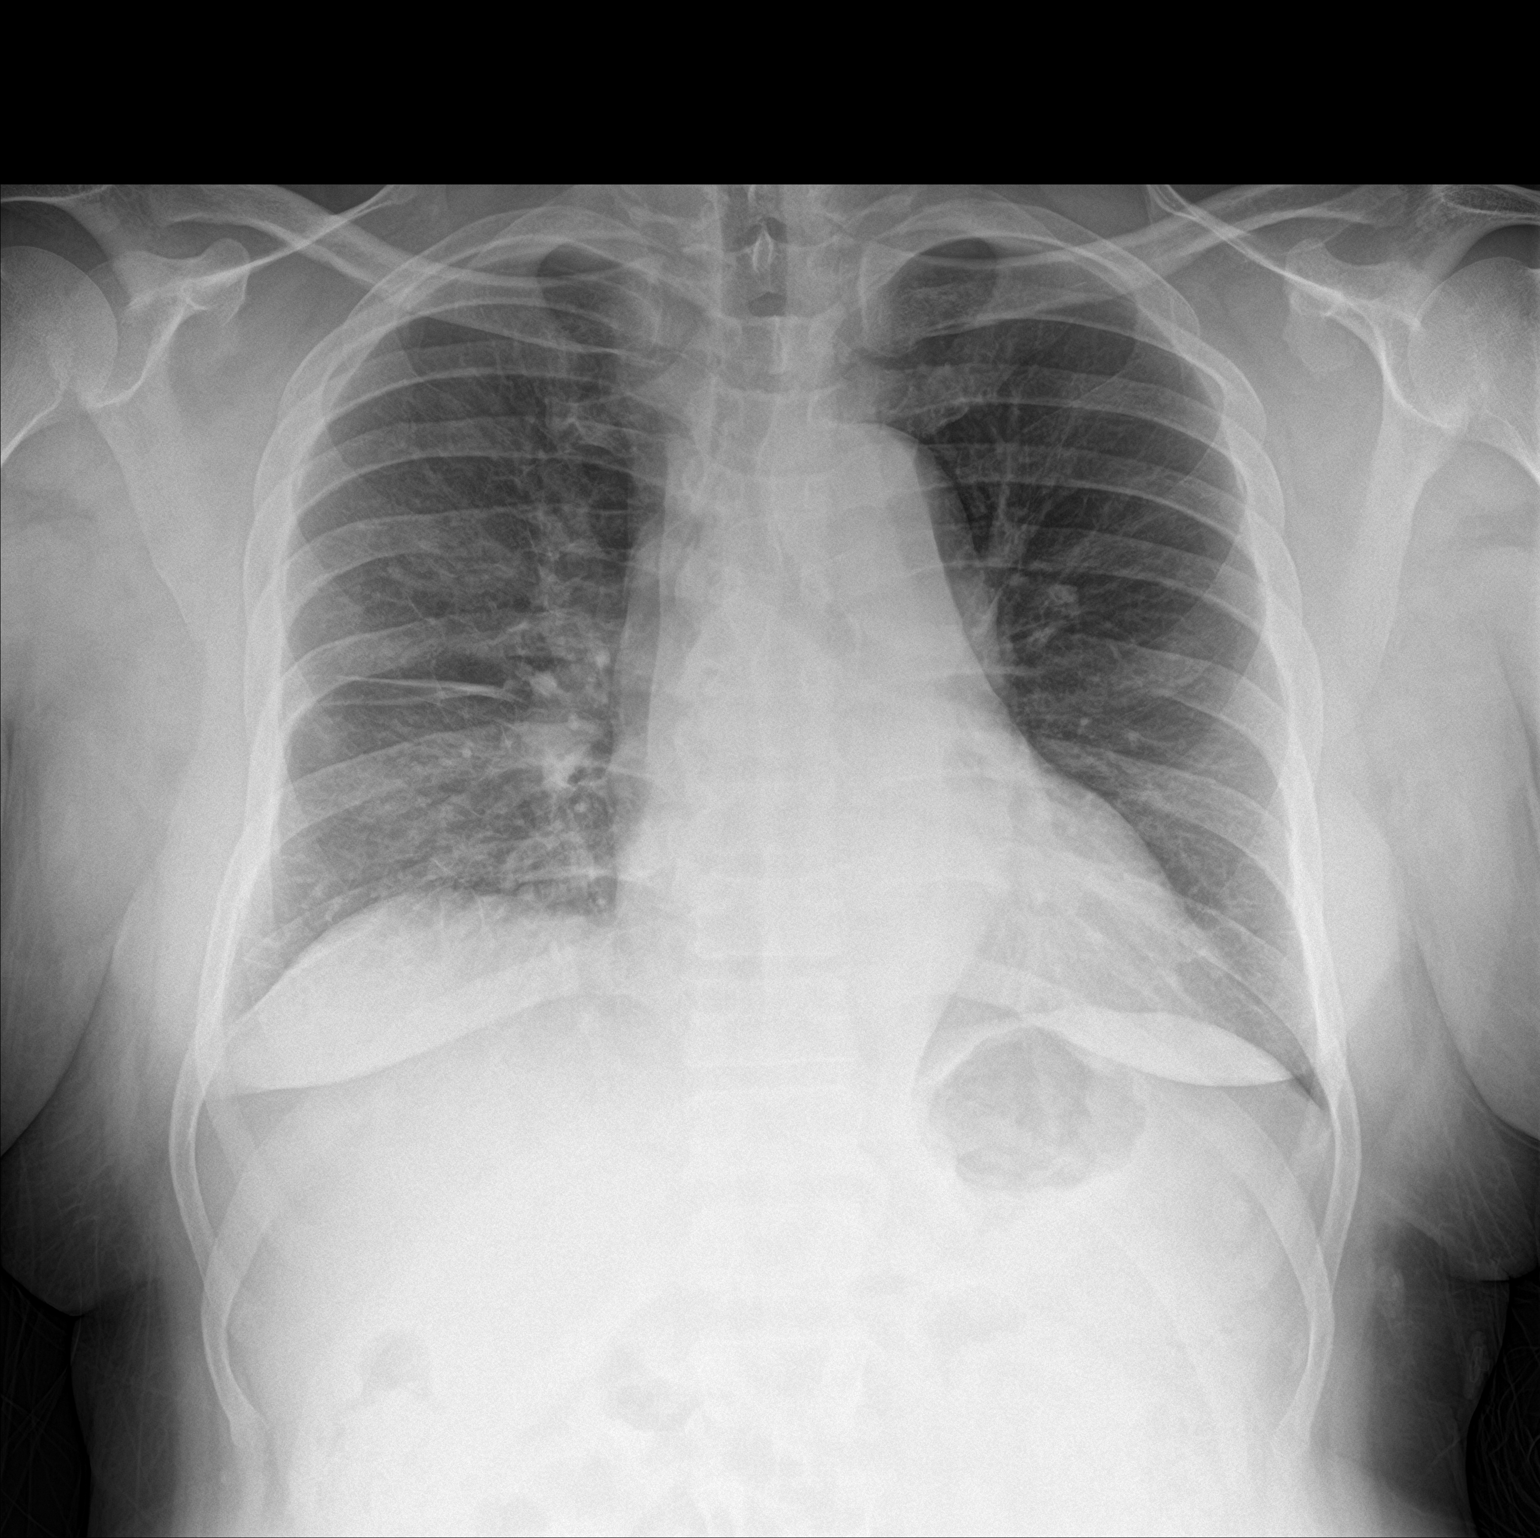

[chest lat]
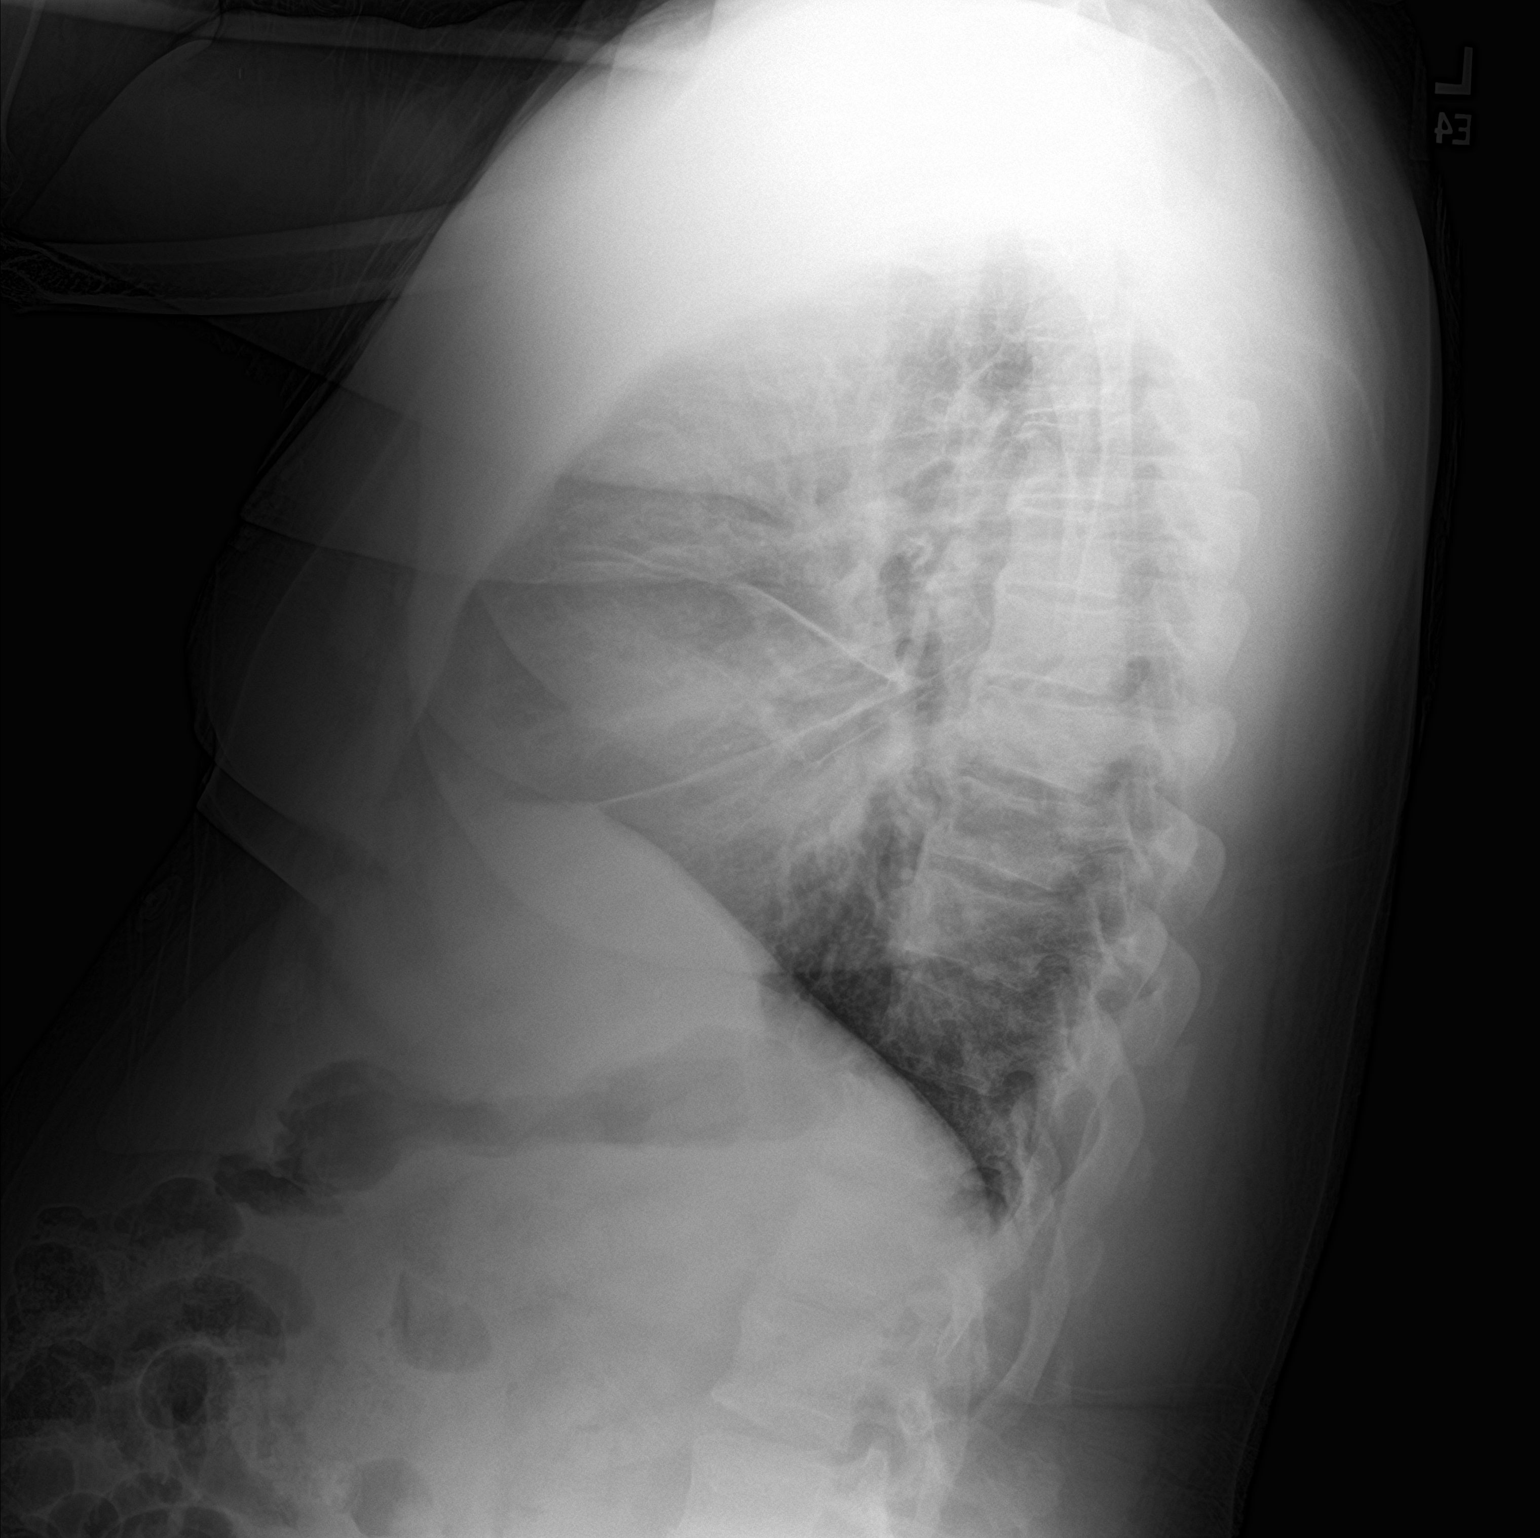

[2 of 2 positions shown; findings below may reference images not displayed]

FINDINGS: Cardiac size is within normal limits. Thoracic aorta is tortuous and
ectatic. There are no signs of alveolar pulmonary edema. There is no
focal pulmonary consolidation. There is subtle increase in
interstitial markings in the right parahilar region and right lower
lung fields. There is no pleural effusion or pneumothorax.
IMPRESSION: There are no signs of alveolar pulmonary edema or focal pulmonary
consolidation. There is slight prominence of interstitial markings
in the right parahilar region and right lower lung fields suggesting
possible interstitial pneumonia. Less likely possibility would be
asymmetric interstitial pulmonary edema.

## 2022-08-27 NOTE — Patient Instructions (Incomplete)
1. Moderate persistent asthma, uncomplicated - Daily controller medication(s): Continue Advair 115/27mcg two puffs twice daily with spacer. Reviewed proper technique. See picture below - Prior to physical activity: albuterol 2 puffs 10-15 minutes before physical activity. - Rescue medications: albuterol 4 puffs every 4-6 hours as needed for cough, wheeze, tightness in chest, or shortness of breath OR albuterol via nebulizer using 1 unit dose every 4-6 hours as needed for cough, wheeze, tightness in chest, or shortness of breath. Refill of albuterol for nebulizer sent  - Asthma control goals:  * Full participation in all desired activities (may need albuterol before activity) * Albuterol use two time or less a week on average (not counting use with activity) * Cough interfering with sleep two time or less a month * Oral steroids no more than once a year * No hospitalizations    2. Chronic rhinitis - skin testing today was  - Continue Xyzal to see if this helps control your allergy symptoms. - start avoidance measures as below   3. Schedule an appointment in

## 2022-08-28 ENCOUNTER — Other Ambulatory Visit: Payer: Self-pay

## 2022-08-28 ENCOUNTER — Ambulatory Visit (INDEPENDENT_AMBULATORY_CARE_PROVIDER_SITE_OTHER): Payer: Medicare Other | Admitting: Family

## 2022-08-28 ENCOUNTER — Encounter: Payer: Self-pay | Admitting: Family

## 2022-08-28 VITALS — BP 170/100 | HR 87 | Temp 97.9°F | Resp 20 | Wt 238.9 lb

## 2022-08-28 DIAGNOSIS — J310 Chronic rhinitis: Secondary | ICD-10-CM | POA: Diagnosis not present

## 2022-08-28 DIAGNOSIS — J4540 Moderate persistent asthma, uncomplicated: Secondary | ICD-10-CM

## 2022-08-28 NOTE — Progress Notes (Signed)
Date of Service/Encounter:  08/28/22  Allergy testing appointment   Initial visit on 07/17/22, seen for moderate persistent asthma and chronic rhinitis.  Please see that note for additional details.  Moderate persistent asthma: He continues to use Advair 115/21 mcg 2 puffs twice a day with spacer and albuterol as needed.  He does feel like his breathing is better since starting the Advair.  He continues to have a productive cough with clear mucus and is not sure where the cough comes from.  He has had this cough for years.  He mentions that he can go all day without coughing and then he will possibly be around something that causes him to cough.  He sometimes has shortness of breath when he has dialysis and is wearing a mask.  He denies wheezing, tightness in chest and nocturnal awakenings due to breathing problems.  He uses his his albuterol maybe once or twice a week.  Since his last office visit he has not required any systemic steroids or made any trips to the emergency room or urgent care due to breathing problems.  Chronic rhinitis: He reports that he has been off all antihistamines for the past 3 days.  He denies heartburn or reflux symptoms. He thinks that he does take pantoprazole 40 mg once a day.   Today reports for allergy diagnostic testing:   Airborne Adult Perc - 08/28/22 0906     Time Antigen Placed 0912    Allergen Manufacturer Waynette Buttery    Location Back    Number of Test 58    1. Control-Buffer 50% Glycerol Negative    2. Control-Histamine 1 mg/ml 3+    3. Albumin saline Negative    4. Bahia Negative    5. French Southern Territ[place] Negative    6. Johnson Negative    7. Kentucky Blue Negative    8. Meadow Fescue Negative    9. Perennial Rye Negative    10. Sweet Vernal Omitted    11. Timothy Negative    12. Cocklebur Negative    13. Burweed Marshelder Negative    14. Ragweed, short Negative    15. Ragweed, Giant Negative    16. Plantain,  English Negative    17. Lamb's Quarters  Negative    18. Sheep Sorrell Negative    19. Rough Pigweed Negative    20. Marsh Elder, Rough Negative    21. Mugwort, Common Negative    22. Ash mix Negative    23. Birch mix Negative    24. Beech American Negative    25. Box, Elder Negative    26. Cedar, red Negative    27. Cottonwood, Guinea-Bissau Negative    28. Elm mix Negative    29. Hickory Negative    30. Maple mix Negative    31. Oak, [place] mix Negative    32. Pecan Pollen Negative    33. Pine mix Negative    34. Sycamore Eastern Negative    35. Walnut, Black Pollen Negative    36. Alternaria alternata Negative    37. Cladosporium Herbarum Negative    38. Aspergillus mix Negative    39. Penicillium mix Negative    40. Bipolaris sorokiniana (Helminthosporium) Negative    41. Drechslera spicifera (Curvularia) Negative    42. Mucor plumbeus Negative    43. Fusarium moniliforme Negative    44. Aureobasidium pullulans (pullulara) Negative    45. Rhizopus oryzae Negative    46. Botrytis cinera Negative    47. Epicoccum nigrum Negative  48. Phoma betae Negative    49. Candida Albicans Negative    50. Trichophyton mentagrophytes Negative    51. Mite, D Farinae  5,000 AU/ml Negative    52. Mite, D Pteronyssinus  5,000 AU/ml Negative    53. Cat Hair 10,000 BAU/ml Negative    54.  Dog Epithelia Negative    55. Mixed Feathers Negative    56. Horse Epithelia Negative    57. Cockroach, German Negative    58. Mouse Negative    59. Tobacco Leaf Negative              DIAGNOSTICS:  Skin Testing: Environmental allergy panel. Adequate positive and negative controls Results discussed with patient/family.   Allergy testing results were read and interpreted by myself, documented by clinical staff.  Patient provided with copy of allergy testing along with avoidance measures when indicated.   1. Moderate persistent asthma, uncomplicated - Daily controller medication(s): Continue Advair 115/80mcg two puffs twice daily  with spacer. Reviewed proper technique. See picture below - Prior to physical activity: albuterol 2 puffs 10-15 minutes before physical activity. - Rescue medications: albuterol 4 puffs every 4-6 hours as needed for cough, wheeze, tightness in chest, or shortness of breath OR albuterol via nebulizer using 1 unit dose every 4-6 hours as needed for cough, wheeze, tightness in chest, or shortness of breath. Refill of albuterol for nebulizer sent  - Asthma control goals:  * Full participation in all desired activities (may need albuterol before activity) * Albuterol use two time or less a week on average (not counting use with activity) * Cough interfering with sleep two time or less a month * Oral steroids no more than once a year * No hospitalizations    2. Chronic rhinitis - skin prick testing today was negative with adequate controls. Unable to do intradermals in right arm due to quantity needed and space available with tattoo size. 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 We will order lab work to check for environmental allergies. We will call you with results once they are back - Continue Xyzal to see if this helps control your allergy symptoms. - start avoidance measures as below    Your blood pressure is elevated today while in the office. Please schedule an appointment with your primary care physician to discuss. 3. Schedule an appointment in 4-6 weeks with Dr. Awanda Mink, FNP [facility]

## 2022-08-31 LAB — ALLERGENS W/TOTAL IGE AREA 2

## 2022-08-31 NOTE — Progress Notes (Signed)
Please let [name] know that his lab work came back negative to environmental allergies. His skin test on 08/28/22 was negative to environmental allergies also. Please keep your follow up appointment on 09/25/22 @ 9:30 AM with Dr. Dellis Anes.

## 2022-09-25 ENCOUNTER — Other Ambulatory Visit: Payer: Self-pay

## 2022-09-25 ENCOUNTER — Encounter: Payer: Self-pay | Admitting: Allergy & Immunology

## 2022-09-25 ENCOUNTER — Ambulatory Visit (INDEPENDENT_AMBULATORY_CARE_PROVIDER_SITE_OTHER): Payer: Medicare Other | Admitting: Allergy & Immunology

## 2022-09-25 VITALS — BP 162/90 | HR 76 | Temp 98.5°F | Resp 14 | Ht 66.0 in | Wt 242.2 lb

## 2022-09-25 DIAGNOSIS — B999 Unspecified infectious disease: Secondary | ICD-10-CM | POA: Diagnosis not present

## 2022-09-25 DIAGNOSIS — J310 Chronic rhinitis: Secondary | ICD-10-CM | POA: Diagnosis not present

## 2022-09-25 DIAGNOSIS — J4540 Moderate persistent asthma, uncomplicated: Secondary | ICD-10-CM

## 2022-09-25 DIAGNOSIS — J4541 Moderate persistent asthma with (acute) exacerbation: Secondary | ICD-10-CM

## 2022-09-25 MED ORDER — LEVOCETIRIZINE DIHYDROCHLORIDE 5 MG PO TABS: 5.0000 mg | ORAL_TABLET | Freq: Every evening | ORAL | 5 refills | Status: DC

## 2022-09-25 MED ORDER — ALBUTEROL SULFATE (2.5 MG/3ML) 0.083% IN NEBU: 2.5000 mg | INHALATION_SOLUTION | Freq: Four times a day (QID) | RESPIRATORY_TRACT | 1 refills | Status: DC | PRN

## 2022-09-25 MED ORDER — PREDNISONE 10 MG PO TABS: ORAL_TABLET | ORAL | 0 refills | Status: DC

## 2022-09-25 MED ORDER — ALBUTEROL SULFATE HFA 108 (90 BASE) MCG/ACT IN AERS: 2.0000 | INHALATION_SPRAY | Freq: Four times a day (QID) | RESPIRATORY_TRACT | 1 refills | Status: AC | PRN

## 2022-09-25 MED ORDER — BREZTRI AEROSPHERE 160-9-4.8 MCG/ACT IN AERO: 2.0000 | INHALATION_SPRAY | Freq: Two times a day (BID) | RESPIRATORY_TRACT | 5 refills | Status: DC

## 2022-09-25 NOTE — Addendum Note (Signed)
Addended by: Philipp Deputy on: 09/25/2022 11:32 AM   Modules accepted: Orders

## 2022-09-25 NOTE — Patient Instructions (Addendum)
1. Moderate persistent asthma with acute exacerbation - Lung testing looked bad, but it did improve with the albuterol treatment.  - Start the prednisone burst that I sent in and complete the entire course.  - We are going to change you from Advair to [facility] (contains THREE medications to help with lung function). - One of the medications in [place] can cause urinary retention (meaning it wil lbe hard for you to pee), so if this becomes a problem, let us know. - Most patients tolerate it just fine, however.  - Daily controller medication(s): Start Breztri two puffs twice daily with spacer. Reviewed proper technique. See picture below - Prior to physical activity: albuterol 2 puffs 10-15 minutes before physical activity. - Rescue medications: albuterol 4 puffs every 4-6 hours as needed for cough, wheeze, tightness in chest, or shortness of breath OR albuterol via nebulizer using 1 unit dose every 4-6 hours as needed for cough, wheeze, tightness in chest, or shortness of breath. Refill of albuterol for nebulizer sent - Asthma control goals:  * Full participation in all desired activities (may need albuterol before activity) * Albuterol use two time or less a week on average (not counting use with activity) * Cough interfering with sleep two time or less a month * Oral steroids no more than once a year * No hospitalizations      2. Chronic non-allergic rhinitis - Skin and blood work was negative. - Continue Xyzal to see if this helps control your allergy symptoms.  3. Recurrent infections  - We will obtain some screening labs to evaluate your immune system.  - Labs to evaluate the quantitative [facility]) aspects of your immune system: IgG/IgA/IgM, CBC with differential - Labs to evaluate the qualitative (HOW WELL THEY WORK) aspects of your immune system: CH50, Pneumococcal titers, Tetanus titers, Diphtheria titers - We may consider immunizations with Pneumovax and Tdap to challenge your  immune system, and then obtain repeat titers in 4-6 weeks.  - We will call you in 1-2 weeks with the results of the testing.   4. Return in about 2 months (around 11/25/2022). You can have the follow up appointment with Dr. Dellis Anes or a Nurse Practicioner (our Nurse Practitioners are excellent and always have Physician oversight!).    Please inform us of any Emergency Department visits, hospitalizations, or changes in symptoms. Call us before going to the ED for breathing or allergy symptoms since we might be able to fit you in for a sick visit. Feel free to contact us anytime with any questions, problems, or concerns.  It was a pleasure to see you and your family again today!  Websites that have reliable patient information: 1. American Academy of Asthma, Allergy, and Immunology: www.aaaai.org 2. Food Allergy Research and Education (FARE): foodallergy.org 3. Mothers of Asthmatics: http://www.asthmacommunitynetwork.org 4. American College of Allergy, Asthma, and Immunology: www.acaai.org   COVID-19 Vaccine Information can be found at: [email or web address] For questions related to vaccine distribution or appointments, please email [email or web address] or call [phone].   We realize that you might be concerned about having an allergic reaction to the COVID19 vaccines. To help with that concern, WE ARE OFFERING THE COVID19 VACCINES IN OUR OFFICE! Ask the front desk for dates!     "Like" [place] on Facebook and Instagram for our latest updates!      A healthy democracy works best when [employer] participate! Make sure you are registered to vote! If you have moved or changed any of your contact information, you  will need to get this updated before voting!  In some cases, you MAY be able to register to vote online: [email or web address]

## 2022-09-25 NOTE — Progress Notes (Signed)
FOLLOW UP  Date of Service/Encounter:  09/25/22   Assessment:   Moderate persistent asthma, uncomplicated   History of right-sided complicated pneumonia and effusion - s/p VATS and decortication    Chronic non-allergic rhinitis  Recurrent infections - getting immune workup   Difficult to control hypertension   ESRD - on diaylsis and on the transplant list   Insulin-dependent diabetes mellitus   On beta blocker therapy   [name]'s breathing looks much worse today.  He does report compliance with his Advair, but we are going to change him to a stronger asthma medication for triple therapy coverage of his breathing.  Sample of Breztri provided and prescription sent in.  We are also getting some labs in case we decide to start an injectable asthma medication.  Patient in agreement with the plan.  We did discuss some more history including multiple pneumonias.  We reviewed the hospitalization from 2019, which consisted of a complicated pneumonia and drainage.  He also had pneumonia in early 2023 as well as late 2023.  He has had a chronic cough since the pneumonia in 2019.  Although his history of renal insufficiency can certainly lead to a secondary immunodeficiency, we are going to do some screening labs to make sure were not dealing with something more serious that might require immunoglobulin replacement or prophylactic antibiotics.  Plan/Recommendations:   1. Moderate persistent asthma with acute exacerbation - Lung testing looked bad, but it did improve with the albuterol treatment.  - Start the prednisone burst that I sent in and complete the entire course.  - We are going to change you from Advair to [facility] (contains THREE medications to help with lung function). - One of the medications in [place] can cause urinary retention (meaning it wil lbe hard for you to pee), so if this becomes a problem, let us know. - Most patients tolerate it just fine, however.  - Daily  controller medication(s): Start Breztri two puffs twice daily with spacer. Reviewed proper technique. See picture below - Prior to physical activity: albuterol 2 puffs 10-15 minutes before physical activity. - Rescue medications: albuterol 4 puffs every 4-6 hours as needed for cough, wheeze, tightness in chest, or shortness of breath OR albuterol via nebulizer using 1 unit dose every 4-6 hours as needed for cough, wheeze, tightness in chest, or shortness of breath. Refill of albuterol for nebulizer sent - Asthma control goals:  * Full participation in all desired activities (may need albuterol before activity) * Albuterol use two time or less a week on average (not counting use with activity) * Cough interfering with sleep two time or less a month * Oral steroids no more than once a year * No hospitalizations  2. Chronic non-allergic rhinitis - Skin and blood work was negative. - Continue Xyzal to see if this helps control your allergy symptoms.  3. Recurrent infections  - We will obtain some screening labs to evaluate your immune system.  - Labs to evaluate the quantitative [facility]) aspects of your immune system: IgG/IgA/IgM, CBC with differential - Labs to evaluate the qualitative (HOW WELL THEY WORK) aspects of your immune system: CH50, Pneumococcal titers, Tetanus titers, Diphtheria titers - We may consider immunizations with Pneumovax and Tdap to challenge your immune system, and then obtain repeat titers in 4-6 weeks.  - We will call you in 1-2 weeks with the results of the testing.   4. Return in about 2 months (around 11/25/2022). You can have the follow up  appointment with Dr. Dellis Anes or a Nurse Practicioner (our Nurse Practitioners are excellent and always have Physician oversight!).   Subjective:   [name] is a 57 y.o. male presenting today for follow up of  Chief Complaint  Patient presents with   Asthma    No issues    Cough    Constant cough     [name]  has a history of the following: Patient Active Problem List   Diagnosis Date Noted   Acute hypoxemic respiratory failure (HCC) 05/22/2022   Acute pulmonary edema (HCC) 03/19/2022   Fluid overload 03/18/2022   Tinea pedis of both feet 11/22/2020   Enlarged heart 02/02/2020   Hospital discharge follow-up 01/12/2020   ESRD (end stage renal disease) (HCC) 01/02/2020   Hypertensive urgency 10/25/2019   Chronic ulcer of right leg (HCC) 10/25/2019   Right otitis media with effusion 10/25/2019   AF (paroxysmal atrial fibrillation) (HCC) 10/25/2019   Bilateral lower extremity edema 10/06/2019   Eustachian tube dysfunction, right 10/06/2019   Chronic anticoagulation 12/11/2018   Elevated troponin 11/02/2018   Diabetic polyneuropathy associated with type 2 diabetes mellitus (HCC) 11/13/2017   Acute renal failure superimposed on chronic kidney disease, on chronic dialysis (HCC) 07/28/2017   Right sided Flank pain 07/27/2017   Weakness 07/27/2017   Myalgia 07/27/2017   Pneumonia of right lung due to infectious organism    Pleural effusion    Uncontrolled hypertension    Post-operative pain    Type 2 diabetes mellitus with hyperlipidemia (HCC)    Acute on chronic diastolic heart failure (HCC)    Encephalopathy    Stage 4 chronic kidney disease (HCC)    SIRS (systemic inflammatory response syndrome) (HCC)    Chest tube in place    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HCC)    Postoperative pain    Pleural effusion on right 05/17/2017   Empyema (HCC) 05/16/2017   Chronic depression 09/20/2015   Recurrent major depressive disorder, in partial remission (HCC) 09/20/2015   Microalbuminuria 01/20/2015   Acute kidney injury (HCC) 12/28/2014   Type 2 diabetes mellitus with hyperglycemia, with long-term current use of insulin (HCC) 12/28/2014   DM (diabetes mellitus) type II controlled, neurological manifestation (HCC) 12/28/2014   Adjustment disorder with mixed anxiety and depressed mood 12/28/2014   Possible Panic  disorder 12/28/2014   Hypertensive emergency without congestive heart failure 12/27/2014   Anxiety 12/23/2014   Class 2 obesity 05/14/2012   Sleep apnea 05/14/2012   Class 2 severe obesity due to excess calories with serious comorbidity and body mass index (BMI) of 38.0 to 38.9 in adult [facility]) 05/14/2012   Severe uncontrolled hypertension 03/28/2011    History obtained from: chart review and patient and mother.  [name] is a 57 y.o. male presenting for a follow up visit.  He was last seen in May 2024 by Nehemiah Settle.  She did testing at the time and it was negative.  Blood work was negative as well.  He was doing well on Advair 2 puffs twice daily of the 115/21 mcg dosing.  Since last visit, he has done well.   Asthma/Respiratory Symptom History: He remains on the Advair two puffs twice daily. He has not bene using his albuterol too often. He has not used it this week, but he did use it last week. He thinks that he maybe used it once or twice. He is coughing nearly nightly.  He does think that the addition of the Advair helped with his breathing. He has not  needed the other inhaler at all this week.  He is surprised on the mention that his breathing looks worse today.  Review of his symptoms show that he has actually been coughing chronically since even before Christmas of 2023 when he had COVID pneumonia. He was diagnosed with PNA and COVID on Christmas Day before we met him. This was December 2023. Then he also had PNA in early 2023. He also referencs a surgery from 2019 which involved draining his chest. He tells me that there was "jelly" in his chest.   There is a hospitalization in February 2019. This is when he was diagnosed with a complicated parapneumonic effusions.  He never had an immune workup after this. He has a Pneumovax from September 2016.  He is fairly good about keeping on top of his routine vaccinations.  Allergic Rhinitis Symptom History: He remains on the Xyzal 5 mg daily  for his nonallergic rhinitis.  This seems to be working fairly well.  He has not been on antibiotics since we saw him.  He remains on the transplant list.  He gets dialysis 3 times a week on Mondays, Wednesdays, and Fridays.  He has an appointment next week with his transplant team.  Otherwise, there have been no changes to his past medical history, surgical history, family history, or social history.    Review of Systems  Constitutional: Negative.  Negative for chills, fever, malaise/fatigue and weight loss.  HENT:  Positive for congestion. Negative for ear discharge, ear pain and sinus pain.   Eyes:  Negative for pain, discharge and redness.  Respiratory:  Positive for cough, sputum production and shortness of breath. Negative for wheezing.   Cardiovascular: Negative.  Negative for chest pain and palpitations.  Gastrointestinal:  Negative for abdominal pain, constipation, diarrhea, heartburn, nausea and vomiting.  Skin: Negative.  Negative for itching and rash.  Neurological:  Negative for dizziness and headaches.  Endo/Heme/Allergies:  Negative for environmental allergies. Does not bruise/bleed easily.       Objective:   Blood pressure (!) 162/90, pulse 76, temperature 98.5 F (36.9 C), resp. rate 14, height 5\' 6"  (1.676 m), weight 242 lb 3.2 oz (109.9 kg), SpO2 97 %. Body mass index is 39.09 kg/m.    Physical Exam Vitals reviewed.  Constitutional:      Appearance: He is well-developed.     Comments: Not a great historian.  HENT:     Head: Normocephalic and atraumatic.     Right Ear: Tympanic membrane, ear canal and external ear normal. No drainage, swelling or tenderness. Tympanic membrane is not injected, scarred, erythematous, retracted or bulging.     Left Ear: Tympanic membrane, ear canal and external ear normal. No drainage, swelling or tenderness. Tympanic membrane is not injected, scarred, erythematous, retracted or bulging.     Nose: No nasal deformity, septal  deviation, mucosal edema or rhinorrhea.     Right Turbinates: Enlarged, swollen and pale.     Left Turbinates: Enlarged, swollen and pale.     Right Sinus: No maxillary sinus tenderness or frontal sinus tenderness.     Left Sinus: No maxillary sinus tenderness or frontal sinus tenderness.     Comments: No polyps.    Mouth/Throat:     Lips: Pink.     Mouth: Mucous membranes are moist. Mucous membranes are not pale and not dry.     Pharynx: Uvula midline.  Eyes:     General:        Right eye: No discharge.  Left eye: No discharge.     Conjunctiva/sclera: Conjunctivae normal.     Right eye: Right conjunctiva is not injected. No chemosis.    Left eye: Left conjunctiva is not injected. No chemosis.    Pupils: Pupils are equal, round, and reactive to light.  Cardiovascular:     Rate and Rhythm: Normal rate and regular rhythm.     Heart sounds: Normal heart sounds.  Pulmonary:     Effort: Pulmonary effort is normal. No tachypnea, accessory muscle usage or respiratory distress.     Breath sounds: Transmitted upper airway sounds present. Examination of the right-upper field reveals decreased breath sounds and rhonchi. Examination of the left-upper field reveals decreased breath sounds and rhonchi. Examination of the right-middle field reveals decreased breath sounds and rhonchi. Examination of the left-middle field reveals decreased breath sounds and rhonchi. Examination of the right-lower field reveals rhonchi. Examination of the left-lower field reveals rhonchi. Decreased breath sounds and rhonchi present. No wheezing or rales.  Chest:     Chest wall: No tenderness.  Abdominal:     Tenderness: There is no abdominal tenderness. There is no guarding or rebound.  Lymphadenopathy:     Head:     Right side of head: No submandibular, tonsillar or occipital adenopathy.     Left side of head: No submandibular, tonsillar or occipital adenopathy.     Cervical: No cervical adenopathy.  Skin:     Coloration: Skin is not pale.     Findings: No abrasion, erythema, petechiae or rash. Rash is not papular, urticarial or vesicular.  Neurological:     Mental Status: He is alert.  Psychiatric:        Behavior: Behavior is cooperative.      Diagnostic studies:    Spirometry: results abnormal (FEV1: 1.28/46%, FVC: 1.84/52%, FEV1/FVC: 70%).    Spirometry consistent with possible restrictive disease.  4 puffs of Xopenex given in clinic with minimal improvement.  Immune labs sent today as well.      Malachi Bonds, MD  [facility]

## 2022-09-26 LAB — STREP PNEUMONIAE 23 SEROTYPES IGG

## 2022-09-26 LAB — CBC WITH DIFFERENTIAL
EOS (ABSOLUTE): 0.2 10*3/uL (ref 0.0–0.4)
Monocytes Absolute: 0.5 10*3/uL (ref 0.1–0.9)

## 2022-09-26 LAB — COMPLEMENT, TOTAL

## 2022-09-27 LAB — STREP PNEUMONIAE 23 SEROTYPES IGG

## 2022-09-27 LAB — CBC WITH DIFFERENTIAL
Basos: 0 %
Hematocrit: 34.2 % — ABNORMAL LOW (ref 37.5–51.0)
Immature Grans (Abs): 0 10*3/uL (ref 0.0–0.1)
Immature Granulocytes: 0 %
MCHC: 32.5 g/dL (ref 31.5–35.7)
MCV: 94 fL (ref 79–97)
RDW: 13.2 % (ref 11.6–15.4)
WBC: 8.2 10*3/uL (ref 3.4–10.8)

## 2022-09-27 LAB — DIPHTHERIA / TETANUS ANTIBODY PANEL: Tetanus Ab, IgG: 0.44 IU/mL (ref <0.10)

## 2022-09-27 LAB — IGG, IGA, IGM: IgM (Immunoglobulin M), Srm: 40 mg/dL (ref 20–172)

## 2022-09-29 LAB — IGG, IGA, IGM
IgA/Immunoglobulin A, Serum: 340 mg/dL (ref 90–386)
IgG (Immunoglobin G), Serum: 1439 mg/dL (ref 603–1613)

## 2022-09-29 LAB — CBC WITH DIFFERENTIAL
Basophils Absolute: 0 10*3/uL (ref 0.0–0.2)
Eos: 3 %
Hemoglobin: 11.1 g/dL — ABNORMAL LOW (ref 13.0–17.7)
Lymphocytes Absolute: 1.8 10*3/uL (ref 0.7–3.1)
Lymphs: 22 %
MCH: 30.6 pg (ref 26.6–33.0)
Monocytes: 6 %
Neutrophils Absolute: 5.6 10*3/uL (ref 1.4–7.0)
Neutrophils: 69 %
RBC: 3.63 x10E6/uL — ABNORMAL LOW (ref 4.14–5.80)

## 2022-09-29 LAB — STREP PNEUMONIAE 23 SEROTYPES IGG
Pneumo Ab Type 1*: 0.1 ug/mL — ABNORMAL LOW (ref >1.3)
Pneumo Ab Type 12 (12F)*: 0.1 ug/mL — ABNORMAL LOW (ref >1.3)
Pneumo Ab Type 2*: <0.2 ug/mL — ABNORMAL LOW (ref >1.3)
Pneumo Ab Type 20*: 0.3 ug/mL — ABNORMAL LOW (ref >1.3)
Pneumo Ab Type 22 (22F)*: 0.2 ug/mL — ABNORMAL LOW (ref >1.3)
Pneumo Ab Type 26 (6B)*: <0.1 ug/mL — ABNORMAL LOW (ref >1.3)
Pneumo Ab Type 34 (10A)*: 1.2 ug/mL — ABNORMAL LOW (ref >1.3)
Pneumo Ab Type 51 (7F)*: 0.8 ug/mL — ABNORMAL LOW (ref >1.3)

## 2022-09-29 LAB — DIPHTHERIA / TETANUS ANTIBODY PANEL: Diphtheria Ab: 0.51 IU/mL (ref <0.10)

## 2022-10-23 IMAGING — CR DG CHEST 2V
2 series · 2 of 2 positions shown · non-contrast
Comparison: 07/03/2021

CLINICAL DATA: Cough for 1 month.

EXAM:
CHEST - 2 VIEW

[w chest pa]
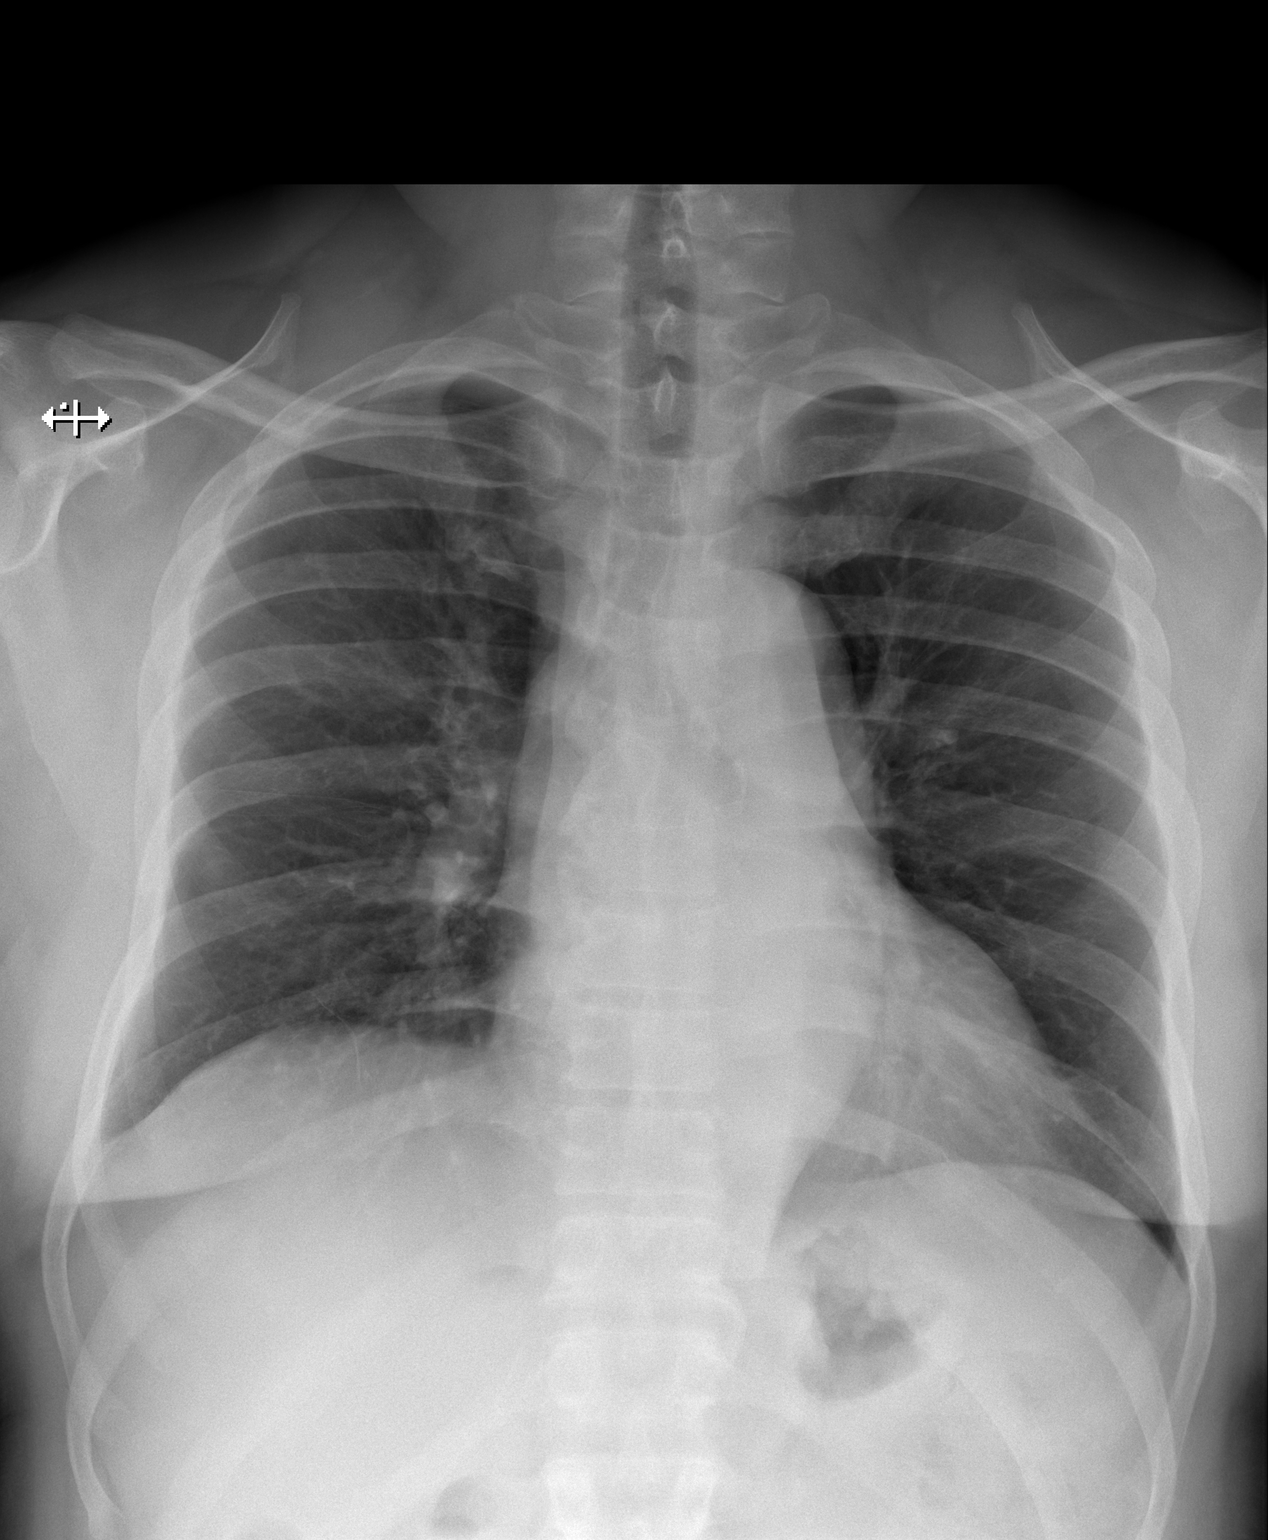

[w chest lat]
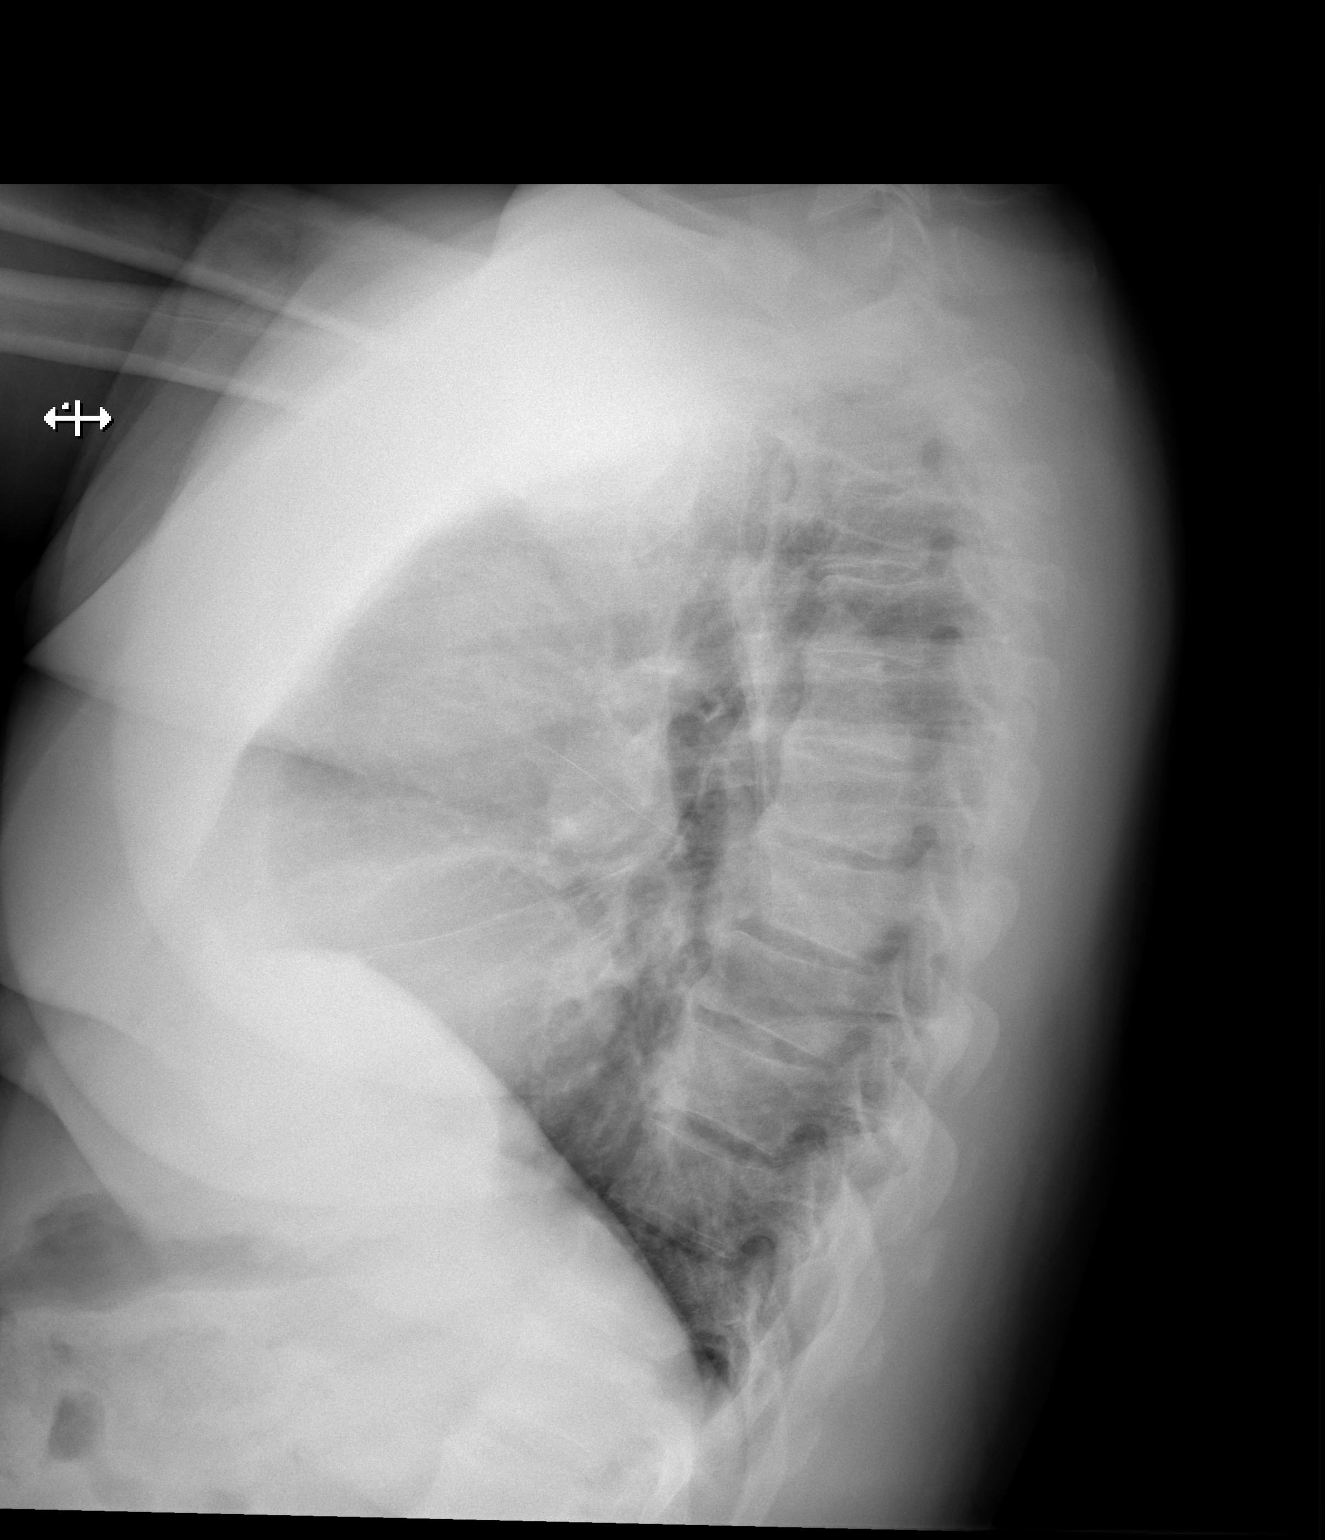

[2 of 2 positions shown; findings below may reference images not displayed]

FINDINGS: Heart size is normal. Tortuosity of thoracic aorta remains stable.
Both lungs are clear.
IMPRESSION: No active cardiopulmonary disease.

## 2022-10-25 ENCOUNTER — Emergency Department (HOSPITAL_COMMUNITY): Payer: Medicare Other

## 2022-10-25 ENCOUNTER — Other Ambulatory Visit: Payer: Self-pay

## 2022-10-25 ENCOUNTER — Inpatient Hospital Stay (HOSPITAL_COMMUNITY)
Admission: EM | Admit: 2022-10-25 | Discharge: 2022-10-27 | DRG: 640 | Disposition: A | Discharge status: Home / Self Care | Payer: Medicare Other | Attending: Family Medicine | Admitting: Family Medicine

## 2022-10-25 ENCOUNTER — Encounter (HOSPITAL_BASED_OUTPATIENT_CLINIC_OR_DEPARTMENT_OTHER): Payer: Medicare Other | Attending: Internal Medicine | Admitting: Internal Medicine

## 2022-10-25 ENCOUNTER — Encounter (HOSPITAL_COMMUNITY): Payer: Self-pay

## 2022-10-25 DIAGNOSIS — E1122 Type 2 diabetes mellitus with diabetic chronic kidney disease: Secondary | ICD-10-CM | POA: Diagnosis present

## 2022-10-25 DIAGNOSIS — Z794 Long term (current) use of insulin: Secondary | ICD-10-CM

## 2022-10-25 DIAGNOSIS — Z992 Dependence on renal dialysis: Secondary | ICD-10-CM | POA: Diagnosis not present

## 2022-10-25 DIAGNOSIS — Z8249 Family history of ischemic heart disease and other diseases of the circulatory system: Secondary | ICD-10-CM

## 2022-10-25 DIAGNOSIS — I5032 Chronic diastolic (congestive) heart failure: Secondary | ICD-10-CM | POA: Diagnosis present

## 2022-10-25 DIAGNOSIS — E871 Hypo-osmolality and hyponatremia: Secondary | ICD-10-CM | POA: Diagnosis present

## 2022-10-25 DIAGNOSIS — I480 Paroxysmal atrial fibrillation: Secondary | ICD-10-CM | POA: Diagnosis present

## 2022-10-25 DIAGNOSIS — E8770 Fluid overload, unspecified: Principal | ICD-10-CM | POA: Diagnosis present

## 2022-10-25 DIAGNOSIS — E119 Type 2 diabetes mellitus without complications: Secondary | ICD-10-CM

## 2022-10-25 DIAGNOSIS — N2581 Secondary hyperparathyroidism of renal origin: Secondary | ICD-10-CM | POA: Diagnosis present

## 2022-10-25 DIAGNOSIS — E1140 Type 2 diabetes mellitus with diabetic neuropathy, unspecified: Secondary | ICD-10-CM | POA: Diagnosis present

## 2022-10-25 DIAGNOSIS — I132 Hypertensive heart and chronic kidney disease with heart failure and with stage 5 chronic kidney disease, or end stage renal disease: Secondary | ICD-10-CM | POA: Diagnosis not present

## 2022-10-25 DIAGNOSIS — I1A0 Resistant hypertension: Secondary | ICD-10-CM | POA: Diagnosis present

## 2022-10-25 DIAGNOSIS — Z91158 Patient's noncompliance with renal dialysis for other reason: Secondary | ICD-10-CM | POA: Diagnosis not present

## 2022-10-25 DIAGNOSIS — G4733 Obstructive sleep apnea (adult) (pediatric): Secondary | ICD-10-CM | POA: Diagnosis present

## 2022-10-25 DIAGNOSIS — I5030 Unspecified diastolic (congestive) heart failure: Secondary | ICD-10-CM | POA: Diagnosis present

## 2022-10-25 DIAGNOSIS — N186 End stage renal disease: Secondary | ICD-10-CM | POA: Diagnosis present

## 2022-10-25 DIAGNOSIS — Z6838 Body mass index (BMI) 38.0-38.9, adult: Secondary | ICD-10-CM | POA: Diagnosis not present

## 2022-10-25 DIAGNOSIS — D631 Anemia in chronic kidney disease: Secondary | ICD-10-CM | POA: Diagnosis not present

## 2022-10-25 DIAGNOSIS — J4540 Moderate persistent asthma, uncomplicated: Secondary | ICD-10-CM | POA: Diagnosis not present

## 2022-10-25 DIAGNOSIS — J9601 Acute respiratory failure with hypoxia: Secondary | ICD-10-CM | POA: Diagnosis present

## 2022-10-25 DIAGNOSIS — Z79899 Other long term (current) drug therapy: Secondary | ICD-10-CM | POA: Diagnosis not present

## 2022-10-25 DIAGNOSIS — K219 Gastro-esophageal reflux disease without esophagitis: Secondary | ICD-10-CM | POA: Diagnosis present

## 2022-10-25 DIAGNOSIS — I10 Essential (primary) hypertension: Secondary | ICD-10-CM | POA: Diagnosis present

## 2022-10-25 DIAGNOSIS — E669 Obesity, unspecified: Secondary | ICD-10-CM | POA: Diagnosis not present

## 2022-10-25 DIAGNOSIS — Z8616 Personal history of COVID-19: Secondary | ICD-10-CM

## 2022-10-25 DIAGNOSIS — R0902 Hypoxemia: Principal | ICD-10-CM

## 2022-10-25 DIAGNOSIS — R0602 Shortness of breath: Secondary | ICD-10-CM | POA: Diagnosis present

## 2022-10-25 LAB — CBC
HCT: 35.1 % — ABNORMAL LOW (ref 39.0–52.0)
Hemoglobin: 11.5 g/dL — ABNORMAL LOW (ref 13.0–17.0)
MCH: 31.5 pg (ref 26.0–34.0)
MCHC: 32.8 g/dL (ref 30.0–36.0)
MCV: 96.2 fL (ref 80.0–100.0)
Platelets: 341 10*3/uL (ref 150–400)
RBC: 3.65 MIL/uL — ABNORMAL LOW (ref 4.22–5.81)
RDW: 13.4 % (ref 11.5–15.5)
WBC: 11.1 10*3/uL — ABNORMAL HIGH (ref 4.0–10.5)
nRBC: 0 % (ref 0.0–0.2)

## 2022-10-25 LAB — RENAL FUNCTION PANEL
Albumin: 2.9 g/dL — ABNORMAL LOW (ref 3.5–5.0)
Anion gap: 17 — ABNORMAL HIGH (ref 5–15)
BUN: 74 mg/dL — ABNORMAL HIGH (ref 6–20)
CO2: 21 mmol/L — ABNORMAL LOW (ref 22–32)
Calcium: 9.1 mg/dL (ref 8.9–10.3)
Chloride: 94 mmol/L — ABNORMAL LOW (ref 98–111)
Creatinine, Ser: 13.36 mg/dL — ABNORMAL HIGH (ref 0.61–1.24)
GFR, Estimated: 4 mL/min — ABNORMAL LOW (ref >60)
Glucose, Bld: 117 mg/dL — ABNORMAL HIGH (ref 70–99)
Phosphorus: 8.3 mg/dL — ABNORMAL HIGH (ref 2.5–4.6)
Potassium: 4 mmol/L (ref 3.5–5.1)
Sodium: 132 mmol/L — ABNORMAL LOW (ref 135–145)

## 2022-10-25 LAB — BASIC METABOLIC PANEL
Anion gap: 15 (ref 5–15)
BUN: 71 mg/dL — ABNORMAL HIGH (ref 6–20)
CO2: 21 mmol/L — ABNORMAL LOW (ref 22–32)
Calcium: 9.3 mg/dL (ref 8.9–10.3)
Chloride: 94 mmol/L — ABNORMAL LOW (ref 98–111)
Creatinine, Ser: 12.93 mg/dL — ABNORMAL HIGH (ref 0.61–1.24)
GFR, Estimated: 4 mL/min — ABNORMAL LOW (ref >60)
Glucose, Bld: 168 mg/dL — ABNORMAL HIGH (ref 70–99)
Potassium: 4.2 mmol/L (ref 3.5–5.1)
Sodium: 130 mmol/L — ABNORMAL LOW (ref 135–145)

## 2022-10-25 LAB — SARS CORONAVIRUS 2 BY RT PCR: SARS Coronavirus 2 by RT PCR: NEGATIVE

## 2022-10-25 LAB — TROPONIN I (HIGH SENSITIVITY)
Troponin I (High Sensitivity): 92 ng/L — ABNORMAL HIGH (ref <18)
Troponin I (High Sensitivity): 94 ng/L — ABNORMAL HIGH (ref <18)

## 2022-10-25 LAB — GLUCOSE, CAPILLARY: Glucose-Capillary: 115 mg/dL — ABNORMAL HIGH (ref 70–99)

## 2022-10-25 LAB — HEPATITIS B SURFACE ANTIGEN: Hepatitis B Surface Ag: NONREACTIVE

## 2022-10-25 MED ORDER — LIDOCAINE-PRILOCAINE 2.5-2.5 % EX CREA: 1.0000 | TOPICAL_CREAM | CUTANEOUS | Status: DC | PRN

## 2022-10-25 MED ORDER — RENA-VITE PO TABS
1.0000 | ORAL_TABLET | Freq: Every day | ORAL | Status: DC
  Administered 2022-10-26 (×2): 1 via ORAL
  Filled 2022-10-25 (×2): qty 1

## 2022-10-25 MED ORDER — PENTAFLUOROPROP-TETRAFLUOROETH EX AERO
INHALATION_SPRAY | CUTANEOUS | Status: AC
  Filled 2022-10-25: qty 30

## 2022-10-25 MED ORDER — CARVEDILOL 25 MG PO TABS
25.0000 mg | ORAL_TABLET | Freq: Two times a day (BID) | ORAL | Status: DC
  Administered 2022-10-26 – 2022-10-27 (×4): 25 mg via ORAL
  Filled 2022-10-25 (×4): qty 1

## 2022-10-25 MED ORDER — LORATADINE 10 MG PO TABS
10.0000 mg | ORAL_TABLET | Freq: Every evening | ORAL | Status: DC
  Administered 2022-10-26 – 2022-10-27 (×3): 10 mg via ORAL
  Filled 2022-10-25 (×3): qty 1

## 2022-10-25 MED ORDER — CHLORHEXIDINE GLUCONATE CLOTH 2 % EX PADS
6.0000 | MEDICATED_PAD | Freq: Every day | CUTANEOUS | Status: DC
  Administered 2022-10-26 – 2022-10-27 (×2): 6 via TOPICAL

## 2022-10-25 MED ORDER — TRAMADOL HCL 50 MG PO TABS
50.0000 mg | ORAL_TABLET | Freq: Two times a day (BID) | ORAL | Status: DC | PRN
  Administered 2022-10-26 (×3): 50 mg via ORAL
  Filled 2022-10-25 (×3): qty 1

## 2022-10-25 MED ORDER — BUDESON-GLYCOPYRROL-FORMOTEROL 160-9-4.8 MCG/ACT IN AERO: 2.0000 | INHALATION_SPRAY | Freq: Two times a day (BID) | RESPIRATORY_TRACT | Status: DC

## 2022-10-25 MED ORDER — HEPARIN SODIUM (PORCINE) 1000 UNIT/ML DIALYSIS: 1000.0000 [IU] | INTRAMUSCULAR | Status: DC | PRN

## 2022-10-25 MED ORDER — HEPARIN SODIUM (PORCINE) 5000 UNIT/ML IJ SOLN
5000.0000 [IU] | Freq: Three times a day (TID) | INTRAMUSCULAR | Status: DC
  Administered 2022-10-26 – 2022-10-27 (×5): 5000 [IU] via SUBCUTANEOUS
  Filled 2022-10-25 (×5): qty 1

## 2022-10-25 MED ORDER — ALBUTEROL SULFATE (2.5 MG/3ML) 0.083% IN NEBU: 2.5000 mg | INHALATION_SOLUTION | Freq: Four times a day (QID) | RESPIRATORY_TRACT | Status: DC | PRN

## 2022-10-25 MED ORDER — ALTEPLASE 2 MG IJ SOLR: 2.0000 mg | Freq: Once | INTRAMUSCULAR | Status: DC | PRN

## 2022-10-25 MED ORDER — LIDOCAINE HCL (PF) 1 % IJ SOLN: 5.0000 mL | INTRAMUSCULAR | Status: DC | PRN

## 2022-10-25 MED ORDER — SEVELAMER CARBONATE 800 MG PO TABS
1600.0000 mg | ORAL_TABLET | Freq: Three times a day (TID) | ORAL | Status: DC
  Administered 2022-10-26 – 2022-10-27 (×7): 1600 mg via ORAL
  Filled 2022-10-25 (×6): qty 2

## 2022-10-25 MED ORDER — ANTICOAGULANT SODIUM CITRATE 4% (200MG/5ML) IV SOLN: 5.0000 mL | Status: DC | PRN

## 2022-10-25 MED ORDER — AMLODIPINE BESYLATE 10 MG PO TABS
10.0000 mg | ORAL_TABLET | Freq: Every day | ORAL | Status: DC
  Administered 2022-10-26 – 2022-10-27 (×2): 10 mg via ORAL
  Filled 2022-10-25 (×2): qty 1

## 2022-10-25 MED ORDER — PENTAFLUOROPROP-TETRAFLUOROETH EX AERO: 1.0000 | INHALATION_SPRAY | CUTANEOUS | Status: DC | PRN

## 2022-10-25 MED ORDER — GABAPENTIN 300 MG PO CAPS
300.0000 mg | ORAL_CAPSULE | Freq: Every day | ORAL | Status: DC
  Administered 2022-10-26 – 2022-10-27 (×2): 300 mg via ORAL
  Filled 2022-10-25 (×2): qty 1

## 2022-10-25 MED ORDER — HEPARIN SODIUM (PORCINE) 1000 UNIT/ML IJ SOLN
INTRAMUSCULAR | Status: AC
  Administered 2022-10-25: 2200 [IU] via INTRAVENOUS_CENTRAL
  Filled 2022-10-25: qty 3

## 2022-10-25 MED ORDER — INSULIN ASPART 100 UNIT/ML IJ SOLN
0.0000 [IU] | Freq: Three times a day (TID) | INTRAMUSCULAR | Status: DC
  Administered 2022-10-27: 1 [IU] via SUBCUTANEOUS

## 2022-10-25 MED ORDER — UMECLIDINIUM BROMIDE 62.5 MCG/ACT IN AEPB
1.0000 | INHALATION_SPRAY | Freq: Every day | RESPIRATORY_TRACT | Status: DC
  Administered 2022-10-26 – 2022-10-27 (×2): 1 via RESPIRATORY_TRACT
  Filled 2022-10-25: qty 7

## 2022-10-25 MED ORDER — MOMETASONE FURO-FORMOTEROL FUM 200-5 MCG/ACT IN AERO
2.0000 | INHALATION_SPRAY | Freq: Two times a day (BID) | RESPIRATORY_TRACT | Status: DC
  Administered 2022-10-26 – 2022-10-27 (×4): 2 via RESPIRATORY_TRACT
  Filled 2022-10-25: qty 8.8

## 2022-10-25 MED ORDER — HEPARIN SODIUM (PORCINE) 1000 UNIT/ML DIALYSIS: 20.0000 [IU]/kg | INTRAMUSCULAR | Status: DC | PRN

## 2022-10-25 NOTE — Assessment & Plan Note (Addendum)
Patient on MWF dialysis but missed session (7/17) due to pain in left leg and increased cough. ~4L off yesterday in HD. - Nephrology following, appreciate assistance - Renvela 3 times daily - RFP ordered, awaiting results  - EKG ordered, waiting for results

## 2022-10-25 NOTE — Assessment & Plan Note (Addendum)
Patients last echo was 05/2022 which showed an EF of 50 to 55% and findings consistent with diastolic dysfunction. On carvedilol 25 mg BID.  - consider repeat echo if shortness of breath does not resolve after dialysis for concern of worsening HF

## 2022-10-25 NOTE — Assessment & Plan Note (Addendum)
History of resistant hypertension. Missed dialysis session on 7/17.  Home medications include Norvasc 10 mg and Coreg 25 mg. Patient BP have downtrended from admission.  - Continue home medications

## 2022-10-25 NOTE — ED Provider Notes (Signed)
[place] EMERGENCY DEPARTMENT AT [facility] Provider Note   CSN: [id number] Arrival date & time: 10/25/22  1059     History  Chief Complaint  Patient presents with   Shortness of Breath    [name] is a 57 y.o. male.  The history is provided by the patient, a relative and medical records. No language interpreter was used.  Shortness of Breath Severity:  Moderate Onset quality:  Gradual Duration:  2 days Timing:  Constant Progression:  Waxing and waning Chronicity:  Recurrent Context: URI (cough)   Relieved by:  Nothing Worsened by:  Nothing Associated symptoms: cough   Associated symptoms: no abdominal pain, no chest pain, no diaphoresis, no fever, no headaches, no neck pain, no rash, no sputum production and no vomiting   Risk factors: no hx of PE/DVT        Home Medications Prior to Admission medications   Medication Sig Start Date End Date Taking? Authorizing Provider  Accu-Chek Softclix Lancets lancets Use as directed.  For insulin-dependent type 2 diabetes Monitor  glucose 3 times a day 01/06/20   Albertine Grates, MD  acetaminophen (TYLENOL) 500 MG tablet Take 1,000-1,500 mg by mouth 2 (two) times daily as needed (pain).    [provider]  albuterol (PROVENTIL) (2.5 MG/3ML) 0.083% nebulizer solution Take 3 mLs (2.5 mg total) by nebulization every 6 (six) hours as needed for wheezing or shortness of breath. 09/25/22   Alfonse Spruce, MD  albuterol (VENTOLIN HFA) 108 (90 Base) MCG/ACT inhaler Inhale 2 puffs into the lungs every 6 (six) hours as needed for wheezing or shortness of breath. 09/25/22   Alfonse Spruce, MD  amLODipine (NORVASC) 10 MG tablet Take 1 tablet (10 mg total) by mouth daily. 05/24/22   Rocky Morel, DO  benzonatate (TESSALON) 100 MG capsule Take 100 mg by mouth 3 (three) times daily as needed for cough. 03/15/22   [provider]  Budeson-Glycopyrrol-Formoterol (BREZTRI AEROSPHERE) 160-9-4.8 MCG/ACT AERO  Inhale 2 puffs into the lungs in the morning and at bedtime. 09/25/22 10/25/22  Alfonse Spruce, MD  calcitRIOL (ROCALTROL) 0.25 MCG capsule Take 1 capsule (0.25 mcg total) by mouth every Monday, Wednesday, and Friday. 01/08/20   Albertine Grates, MD  carvedilol (COREG) 25 MG tablet Take 1 tablet (25 mg total) by mouth 2 (two) times daily with a meal. 05/24/22   Rocky Morel, DO  gabapentin (NEURONTIN) 300 MG capsule Take 1 capsule (300 mg total) by mouth daily. 05/24/22   Rocky Morel, DO  glucose blood test strip Use as instructed For insulin-dependent type 2 diabetes Monitor  glucose 3 times a day 01/06/20   Albertine Grates, MD  Insulin Lispro Prot & Lispro (HUMALOG 75/25 MIX) (75-25) 100 UNIT/ML Kwikpen Inject 5-10 Units into the skin 2 (two) times daily with a meal. 05/24/22   Rocky Morel, DO  Insulin Pen Needle 32G X 4 MM MISC Use as directed up to four times daily 05/24/22   Rocky Morel, DO  ketoconazole (NIZORAL) 2 % cream Apply to both feet and between toes once daily for 6 weeks. 07/17/22   Freddie Breech, DPM  levocetirizine (XYZAL) 5 MG tablet Take 1 tablet (5 mg total) by mouth every evening. 09/25/22   Alfonse Spruce, MD  multivitamin (RENA-VIT) TABS tablet Take 1 tablet by mouth at bedtime. 05/24/22   Rocky Morel, DO  pantoprazole (PROTONIX) 40 MG tablet Take 1 tablet (40 mg total) by mouth daily. 05/24/22   Rocky Morel,  DO  predniSONE (DELTASONE) 10 MG tablet Take two tablets (20mg ) twice daily for three days, then one tablet (10mg ) twice daily for three days, then STOP. 09/25/22   Alfonse Spruce, MD  sevelamer carbonate (RENVELA) 800 MG tablet Take 2-3 tablets (1,600-2,400 mg total) by mouth 3 (three) times daily with meals. 05/24/22   Rocky Morel, DO  tiZANidine (ZANAFLEX) 4 MG tablet Take 1 tablet (4 mg total) by mouth every 8 (eight) hours as needed for muscle spasms. 05/24/22   Rocky Morel, DO  Vitamin D, Ergocalciferol, (DRISDOL) 1.25 MG (50000 UNIT) CAPS  capsule Take 50,000 Units by mouth every Sunday. 08/17/19   [provider]  insulin aspart protamine - aspart (NOVOLOG MIX 70/30 FLEXPEN) (70-30) 100 UNIT/ML FlexPen Inject 15-30 Units into the skin 2 (two) times daily.  05/24/22  [provider]      Allergies    Patient has no known allergies.    Review of Systems   Review of Systems  Constitutional:  Positive for fatigue. Negative for chills, diaphoresis and fever.  HENT:  Negative for congestion.   Respiratory:  Positive for cough, chest tightness and shortness of breath. Negative for sputum production.   Cardiovascular:  Negative for chest pain.  Gastrointestinal:  Negative for abdominal pain, constipation, diarrhea, nausea and vomiting.  Genitourinary:  Negative for dysuria and flank pain.  Musculoskeletal:  Negative for back pain, neck pain and neck stiffness.  Skin:  Positive for wound (leg wound unchanged from prior). Negative for rash.  Neurological:  Negative for weakness, light-headedness, numbness and headaches.  Psychiatric/Behavioral:  Negative for agitation and confusion.   All other systems reviewed and are negative.   Physical Exam Updated Vital Signs BP (!) 197/99 (BP Location: Right Arm)   Pulse 94   Temp 98.8 F (37.1 C) (Oral)   Resp 20   Ht 5\' 6"  (1.676 m)   Wt 109.9 kg   SpO2 97%   BMI 39.11 kg/m  Physical Exam Vitals and nursing note reviewed.  Constitutional:      General: He is not in acute distress.    Appearance: He is well-developed. He is not ill-appearing, toxic-appearing or diaphoretic.  HENT:     Head: Normocephalic and atraumatic.  Eyes:     Extraocular Movements: Extraocular movements intact.     Conjunctiva/sclera: Conjunctivae normal.     Pupils: Pupils are equal, round, and reactive to light.  Cardiovascular:     Rate and Rhythm: Normal rate and regular rhythm.     Heart sounds: No murmur heard. Pulmonary:     Effort: Pulmonary effort is normal. No tachypnea or  respiratory distress.     Breath sounds: Rales present.  Chest:     Chest wall: No tenderness.  Abdominal:     Palpations: Abdomen is soft.     Tenderness: There is no abdominal tenderness.  Musculoskeletal:        General: No swelling.     Cervical back: Neck supple.     Right lower leg: No tenderness.     Left lower leg: No tenderness.  Skin:    General: Skin is warm and dry.     Capillary Refill: Capillary refill takes less than 2 seconds.     Findings: No erythema.  Neurological:     General: No focal deficit present.     Mental Status: He is alert.  Psychiatric:        Mood and Affect: Mood normal.     ED  Results / Procedures / Treatments   Labs (all labs ordered are listed, but only abnormal results are displayed) Labs Reviewed  BASIC METABOLIC PANEL - Abnormal; Notable for the following components:      Result Value   Sodium 130 (*)    Chloride 94 (*)    CO2 21 (*)    Glucose, Bld 168 (*)    BUN 71 (*)    Creatinine, Ser 12.93 (*)    GFR, Estimated 4 (*)    All other components within normal limits  CBC - Abnormal; Notable for the following components:   WBC 11.1 (*)    RBC 3.65 (*)    Hemoglobin 11.5 (*)    HCT 35.1 (*)    All other components within normal limits  TROPONIN I (HIGH SENSITIVITY) - Abnormal; Notable for the following components:   Troponin I (High Sensitivity) 92 (*)    All other components within normal limits  SARS CORONAVIRUS 2 BY RT PCR  TROPONIN I (HIGH SENSITIVITY)    EKG EKG Interpretation Date/Time:  Thursday October 25 2022 10:58:38 EDT Ventricular Rate:  97 PR Interval:  168 QRS Duration:  82 QT Interval:  378 QTC Calculation: 480 R Axis:   -5  Text Interpretation: Normal sinus rhythm Possible Left atrial enlargement Prolonged QT Abnormal ECG When compared with ECG of 22-May-2022 10:44, PREVIOUS ECG IS PRESENT when compared to prior, similar appearance. No STEMI Confirmed by Theda Belfast ([id number]) on 10/25/2022 12:24:45  PM  Radiology DG Chest Port 1 View  Result Date: 10/25/2022 CLINICAL DATA:  141880 SOB (shortness of breath) [id number] EXAM: PORTABLE CHEST 1 VIEW COMPARISON:  CXR 05/23/22 FINDINGS: No pleural effusion. No pneumothorax. Cardiomegaly. There are prominent bilateral interstitial opacities that are nonspecific and could represent pulmonary edema or multifocal infection. No radiographically apparent displaced rib fractures. Visualized upper abdomen is unremarkable. IMPRESSION: Cardiomegaly with prominent bilateral interstitial opacities that are nonspecific and could represent pulmonary edema or multifocal infection. Electronically Signed   By: Lorenza Cambridge M.D.   On: 10/25/2022 12:44    Procedures Procedures    Medications Ordered in ED Medications - No data to display  ED Course/ Medical Decision Making/ A&P                             Medical Decision Making Amount and/or Complexity of Data Reviewed Labs: ordered. Radiology: ordered.  Risk Decision regarding hospitalization.    [name] Florence is a 57 y.o. male with a past medical history significant for hypertension, ESRD on dialysis MWF, diabetes, GERD, CHF, previous pneumonia who presents with hypoxia and shortness of breath.  Patient reports that he missed dialysis yesterday because he was feeling ill and short of breath.  He then went to his chronic wound care visit today and he was so short of breath they could not help him.  Patient was found to be hypoxic on arrival to the emergency department.  Patient said that he chronically has a cough and that does not seem much different.  Denies fevers or chills.  Denies any chest pain palpitations or abdominal pain.  Denies nausea, vomiting, constipation, or diarrhea.  He does make some urine still and reports no changes.  He denies new leg pain has chronic pain in his left shin wound.  He reports it has not changed.  He denies any new swelling in his legs.  He is not sure if he is fluid  overloaded but  he missed dialysis yesterday.  Patient oxygen saturations are in the mid 80s on arrival and is now on 3 L to maintain oxygen saturations in the low 90s.  He had some faint rales on exam.  No murmur on my initial exam.  Abdomen nontender.  Patient does have pulses in lower extremities.  Left shin wound was examined and he has no surrounding erythema tenderness or crepitance.  Rest of the exam unremarkable.  Will get labs, EKG, and a chest x-ray.  Will likely need to talk to nephrology as he may need dialysis as he missed yesterday and is now hypoxic and short of breath.  Will make sure there is no pneumonia or other abnormality.  Anticipate admission for his hypoxia.  I spoke with nephrology who agree he needs dialysis.  They will put orders in for dialysis and agree with medicine admission.  They agreed on holding on antibiotics as he is afebrile and this seems less likely to be pneumonia.  Medicine will admit for further management.         Final Clinical Impression(s) / ED Diagnoses Final diagnoses:  Hypoxia   \Clinical Impression: 1. Hypoxia     Disposition: Admit  This note was prepared with assistance of Dragon voice recognition software. Occasional wrong-word or sound-a-like substitutions may have occurred due to the inherent limitations of voice recognition software.      Huy Majid, Canary Brim, MD 10/25/22 1409

## 2022-10-25 NOTE — Assessment & Plan Note (Addendum)
Patient reports taking insulin at home, but currently checks his blood sugars and has not needed much.  Patient has associated polyneuropathy.  Home medications include Humalog 75/25 mix 5 to 10 units twice daily, gabapentin 300 mg daily. - CBG checks -Very sensitive SSI - gabapentin 300 mg daily

## 2022-10-25 NOTE — Assessment & Plan Note (Signed)
Patient reports taking insulin at home, but currently checks his blood sugars and has not needed much.  Patient has associated polyneuropathy. - CBG checks - SSI - gabapentin 300 mg daily

## 2022-10-25 NOTE — ED Notes (Signed)
Pt transported to dialysis

## 2022-10-25 NOTE — Assessment & Plan Note (Signed)
Patient has a history of resistant hypertension. Patient missed dialysis session on 7/17.  Patient's home medications include Norvasc 10 mg and Coreg 25 mg. - Continue Norvasc 10 mg and Coreg 25 mg

## 2022-10-25 NOTE — Consult Note (Signed)
ESRD Consult Note  Requesting provider: Dr. Rush Landmark  Reason for consult: ESRD, provision of dialysis  Assessment/Recommendations:  ESRD -outpatient HD orders: [facility], MWF.  4 hours 15 minutes. EDW 103.5 kg.  2K/2 calcium.  Flow rates: 400/autoflow 1.5.  LUE AVF, 15-gauge needles.  Meds: Venofer 50 mg once weekly, Hectorol 5 mcg every treatment, Sensipar 180 mg q. treatment.  Heparin 4100 units bolus -HD today, HD tomorrow per MWF schedule  SOB, hypoxia -possible pulm edema vs PNA -covid panel pending -UF as tolerated  Volume/ hypertension  -will attempt to optimize volume status via HD  Anemia of Chronic Kidney Disease -Hemoglobin 11.5, acceptable -Transfuse PRN for Hgb <7  Secondary Hyperparathyroidism/Hyperphosphatemia -Resume Hectorol and Sensipar.  Check PO4.  Renal diet  Hyponatremia -Managing with HD and UF  Recommendations were discussed with the primary team. Outside records were reviewed in the care of this patient. Labs and imaging were independently interpreted to aid in clinical decision making. Findings and plan were conveyed to the primary team.  Anthony Sar, MD [facility]  History of Present Illness: [name] is a/an 57 y.o. male with a past medical history of ESRD, DM2, hypertension, HFpEF, chronic wounds, paroxysmal A-fib who presents with shortness of breath, he reports that this has been going on for some time but progressively worsening.  O2 sats were in the mid 80s on arrival.  Chest x-ray with pulmonary edema versus multifocal pneumonia.  Missed dialysis yesterday due to feeling ill and short of breath. Denies chest pain or worsening swelling.   Medications:  No current facility-administered medications for this encounter.   Current Outpatient Medications  Medication Sig Dispense Refill   Accu-Chek Softclix Lancets lancets Use as directed.  For insulin-dependent type 2 diabetes Monitor  glucose 3 times a day (Patient taking  differently: 1 each by Other route See admin instructions. Use as directed.  For insulin-dependent type 2 diabetes Monitor  glucose 3 times a day) 100 each 0   albuterol (PROVENTIL) (2.5 MG/3ML) 0.083% nebulizer solution Take 3 mLs (2.5 mg total) by nebulization every 6 (six) hours as needed for wheezing or shortness of breath. 75 mL 1   albuterol (VENTOLIN HFA) 108 (90 Base) MCG/ACT inhaler Inhale 2 puffs into the lungs every 6 (six) hours as needed for wheezing or shortness of breath. 18 g 1   amLODipine (NORVASC) 10 MG tablet Take 1 tablet (10 mg total) by mouth daily. 30 tablet 0   Budeson-Glycopyrrol-Formoterol (BREZTRI AEROSPHERE) 160-9-4.8 MCG/ACT AERO Inhale 2 puffs into the lungs in the morning and at bedtime. 1 each 5   calcitRIOL (ROCALTROL) 0.25 MCG capsule Take 1 capsule (0.25 mcg total) by mouth every Monday, Wednesday, and Friday. 12 capsule 0   carvedilol (COREG) 25 MG tablet Take 1 tablet (25 mg total) by mouth 2 (two) times daily with a meal. 60 tablet 0   doxycycline (VIBRAMYCIN) 100 MG capsule Take 100 mg by mouth 2 (two) times daily.     gabapentin (NEURONTIN) 300 MG capsule Take 1 capsule (300 mg total) by mouth daily. 30 capsule 0   glucose blood test strip Use as instructed For insulin-dependent type 2 diabetes Monitor  glucose 3 times a day (Patient taking differently: 1 each by Other route See admin instructions. Use as instructed For insulin-dependent type 2 diabetes Monitor  glucose 3 times a day) 100 each 0   Insulin Lispro Prot & Lispro (HUMALOG 75/25 MIX) (75-25) 100 UNIT/ML Kwikpen Inject 5-10 Units into the skin 2 (two)  times daily with a meal. (Patient taking differently: Inject 15 Units into the skin 2 (two) times daily with a meal.) 15 mL 11   Insulin Pen Needle 32G X 4 MM MISC Use as directed up to four times daily (Patient taking differently: 1 each by Other route 4 (four) times daily.) 100 each 0   levocetirizine (XYZAL) 5 MG tablet Take 1 tablet (5 mg total)  by mouth every evening. 30 tablet 5   multivitamin (RENA-VIT) TABS tablet Take 1 tablet by mouth at bedtime. 30 tablet 0   pantoprazole (PROTONIX) 40 MG tablet Take 1 tablet (40 mg total) by mouth daily. 30 tablet 0   sevelamer carbonate (RENVELA) 800 MG tablet Take 2-3 tablets (1,600-2,400 mg total) by mouth 3 (three) times daily with meals. 270 tablet 0   tiZANidine (ZANAFLEX) 4 MG tablet Take 1 tablet (4 mg total) by mouth every 8 (eight) hours as needed for muscle spasms. 90 tablet 0   traMADol (ULTRAM) 50 MG tablet Take 50 mg by mouth 2 (two) times daily as needed.     Vitamin D, Ergocalciferol, (DRISDOL) 1.25 MG (50000 UNIT) CAPS capsule Take 50,000 Units by mouth 3 (three) times a week.     ketoconazole (NIZORAL) 2 % cream Apply to both feet and between toes once daily for 6 weeks. (Patient not taking: Reported on 10/25/2022) 60 g 1   predniSONE (DELTASONE) 10 MG tablet Take two tablets (20mg ) twice daily for three days, then one tablet (10mg ) twice daily for three days, then STOP. (Patient not taking: Reported on 10/25/2022) 18 tablet 0     ALLERGIES Patient has no known allergies.  MEDICAL HISTORY Past Medical History:  Diagnosis Date   Acute kidney injury (HCC) 12/28/2014   Adjustment disorder with mixed anxiety and depressed mood 12/28/2014   Asthma    CHF (congestive heart failure) (HCC)    CPAP (continuous positive airway pressure) dependence    Diabetes mellitus without complication (HCC)    DM (diabetes mellitus) type II controlled with renal manifestation (HCC) 12/28/2014   DM (diabetes mellitus) type II controlled, neurological manifestation (HCC) 12/28/2014   Encephalopathy, hypertensive 12/28/2014   GERD (gastroesophageal reflux disease)    Hypertension    Hypertensive emergency without congestive heart failure 12/27/2014   Pneumonia    Possible Panic disorder 12/28/2014   Renal disorder    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   Sleep apnea      SOCIAL  HISTORY Social History   Socioeconomic History   Marital status: Married    Spouse name: Not on file   Number of children: Not on file   Years of education: Not on file   Highest education level: Not on file  Occupational History   Not on file  Tobacco Use   Smoking status: Never    Passive exposure: Never   Smokeless tobacco: Never  Vaping Use   Vaping status: Never Used  Substance and Sexual Activity   Alcohol use: No   Drug use: No   Sexual activity: Not on file  Other Topics Concern   Not on file  Social History Narrative   Not on file   Social Determinants of Health   Financial Resource Strain: Not on file  Food Insecurity: No Food Insecurity (03/19/2022)   Hunger Vital Sign    Worried About Running Out of Food in the Last Year: Never true    Ran Out of Food in the Last Year: Never true  Transportation Needs: No Transportation Needs (  03/19/2022)   PRAPARE - Administrator, Civil Service (Medical): No    Lack of Transportation (Non-Medical): No  Physical Activity: Not on file  Stress: Not on file  Social Connections: Unknown (08/22/2021)   [facility]   Social Network    Social Network: Not on file  Intimate Partner Violence: Not At Risk (03/19/2022)   Humiliation, Afraid, Rape, and Kick questionnaire    Fear of Current or Ex-Partner: No    Emotionally Abused: No    Physically Abused: No    Sexually Abused: No     FAMILY HISTORY Family History  Problem Relation Age of Onset   Hypertension Mother      Review of Systems: 12 systems were reviewed and negative except per HPI  Physical Exam: Vitals:   10/25/22 1400 10/25/22 1411  BP: (!) 195/112 (!) 195/110  Pulse:  96  Resp: (!) 22 16  Temp:    SpO2:  100%   No intake/output data recorded. No intake or output data in the 24 hours ending 10/25/22 1420 General: well-appearing, no acute distress HEENT: anicteric sclera, MMM CV: normal rate, no murmurs, no edema Lungs:  fine crackles bibasilar, bilateral chest rise, normal wob Abd: soft, non-tender, non-distended Skin: no visible lesions or rashes Msk: trace edema b/l Les, dressings in place Neuro: normal speech, no gross focal deficits  Dialysis access: LUE AVF +b/t  Test Results Reviewed Lab Results  Component Value Date   NA 130 (L) 10/25/2022   K 4.2 10/25/2022   CL 94 (L) 10/25/2022   CO2 21 (L) 10/25/2022   BUN 71 (H) 10/25/2022   CREATININE 12.93 (H) 10/25/2022   CALCIUM 9.3 10/25/2022   ALBUMIN 3.2 (L) 05/22/2022   PHOS 4.1 05/23/2022    I have reviewed relevant outside healthcare records
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
## 2022-10-25 NOTE — Assessment & Plan Note (Signed)
>>  ASSESSMENT AND PLAN FOR SEVERE HYPERTENSION WRITTEN ON 10/26/2022 12:32 PM BY Penne Lash, MD  History of resistant hypertension. Missed dialysis session on 7/17.  Home medications include Norvasc 10 mg and Coreg 25 mg. Patient BP have downtrended from admission.  - Continue home medications

## 2022-10-25 NOTE — ED Triage Notes (Signed)
Pt sent from wound clinic for SOB, dizziness and HA started last night. Pt eupneic at this time, but has 02 on right now.
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
## 2022-10-25 NOTE — Procedures (Signed)
HD Note:  Some information was entered later than the data was gathered due to patient care needs. The stated time with the data is accurate.  Received patient in bed to unit.   Alert and oriented.   Informed consent signed and in chart.    Access used: Upper Left arm fistula Access issues: Cannulation was difficult, with 3 sticks necessary.  The fistula sounded and felt WDL.  The cannulations found clotting in one area in the lower portion of the fistula.  Hand-off given to oncoming dialysis nurse.   Dejha King L. Dareen Piano, RN Kidney Dialysis Unit.

## 2022-10-25 NOTE — Assessment & Plan Note (Signed)
Patient presented to the ED with shortness of breath found to be hypoxic with O2 sat 88% patient started on 3 L nasal cannula satting in the high 90s. X-ray showed concern for pulmonary edema versus multifocal infiltrate. - home Breztri, albuterol prn - Consider respiratory panel if patients oxygenation does not improve after dialysis - continue to monitor

## 2022-10-25 NOTE — Hospital Course (Signed)
[name] is a 57 y.o.male with a history of ESRD on dialysis, resistant hypertension, DMII, HFpEF who was admitted to the medicine teaching Service at [facility] for acute respiratory failure. His hospital course is detailed below:   Acute Hypoxic Respiratoy Failure Patient presented to ED with increased shortness of breath from missing dialysis session. Patient was dialyzed during admission, and reported increased improvement. Patient able to ambulate with no dyspnea and saturations 95-100%.   ESRD on Dialysis Patient has history of ESRD on MWF dialysis.  Patient received a another dialysis session due to missing dialysis on Wednesday.  Per nephrology, patient is stable for discharge and will follow MWF schedule.   HFpEF Patient last echo was 05/2022 which showed ejection fraction of 50 to 55% and findings consistent with diastolic dysfunction. Patient received home dose of Coreg (25 mg twice daily) during admission and was discharged on these medications.  DMII Patient discharged on home insulin recommended to follow-up with PCP regarding diabetes management.  Left leg wound  Patient had had left leg wound upon arrival which was bandaged.  He was seen by wound who recommended he follow-up outpatient with wound care center.  He states he was prescribed doxycycline and for the wound.  Doxycycline was discontinued during his admission and he was instructed to restart and finish course upon discharge.  Other chronic conditions were medically managed with home medications and formulary alternatives as necessary (HTN)  PCP Follow-up Recommendations: Patient taking insulin at home, did not require much in the hospital. Consider adjusting dosage Patient has a wound on Left leg for which he sees wound clinic. He was prescribed doxycycline for this prior to admission. Follow up on wound and adherence to Abx.   Patient on Dulera and Incruse Ellipta daily, follow up regarding respiratory function   Patient has resistant hypertension and is also managed by nephrologist, consider optimizing antihypertensives

## 2022-10-25 NOTE — Assessment & Plan Note (Addendum)
Patient presented to the ED with shortness of breath found to be hypoxic with O2 sat 88% patient started on 3 L nasal cannula satting in the high 90s. Patient states breathing is much better today. Was on 2L [place], though unsure if needed as pulse ox was off.  - Daily weights - Dulera twice daily with Incruse Ellipta daily, albuterol prn - continuous pulse ox - wean O2 as tolerated

## 2022-10-25 NOTE — Assessment & Plan Note (Signed)
>>  ASSESSMENT AND PLAN FOR SEVERE HYPERTENSION WRITTEN ON 10/25/2022  5:34 PM BY Penne Lash, MD  Patient has a history of resistant hypertension. Patient missed dialysis session on 7/17.  Patient's home medications include Norvasc 10 mg and Coreg 25 mg. - Continue Norvasc 10 mg and Coreg 25 mg

## 2022-10-25 NOTE — Assessment & Plan Note (Signed)
Patient on MWF dialysis but missed this session yesterday (7/17) due to pain in left leg and increased cough.  Per chart review patient has missed dialysis in the past for similar reasons.  Nephrology consulted who recommended dialysis tonight and another session tomorrow - RNP after dialysis

## 2022-10-26 ENCOUNTER — Inpatient Hospital Stay (HOSPITAL_COMMUNITY): Payer: Medicare Other

## 2022-10-26 DIAGNOSIS — J9601 Acute respiratory failure with hypoxia: Secondary | ICD-10-CM | POA: Diagnosis not present

## 2022-10-26 LAB — RENAL FUNCTION PANEL
Albumin: 2.8 g/dL — ABNORMAL LOW (ref 3.5–5.0)
Anion gap: 11 (ref 5–15)
BUN: 43 mg/dL — ABNORMAL HIGH (ref 6–20)
CO2: 28 mmol/L (ref 22–32)
Calcium: 9.3 mg/dL (ref 8.9–10.3)
Chloride: 95 mmol/L — ABNORMAL LOW (ref 98–111)
Creatinine, Ser: 8.76 mg/dL — ABNORMAL HIGH (ref 0.61–1.24)
GFR, Estimated: 7 mL/min — ABNORMAL LOW (ref >60)
Glucose, Bld: 144 mg/dL — ABNORMAL HIGH (ref 70–99)
Phosphorus: 6.3 mg/dL — ABNORMAL HIGH (ref 2.5–4.6)
Potassium: 4.2 mmol/L (ref 3.5–5.1)
Sodium: 134 mmol/L — ABNORMAL LOW (ref 135–145)

## 2022-10-26 LAB — GLUCOSE, CAPILLARY
Glucose-Capillary: 139 mg/dL — ABNORMAL HIGH (ref 70–99)
Glucose-Capillary: 144 mg/dL — ABNORMAL HIGH (ref 70–99)
Glucose-Capillary: 111 mg/dL — ABNORMAL HIGH (ref 70–99)
Glucose-Capillary: 116 mg/dL — ABNORMAL HIGH (ref 70–99)

## 2022-10-26 LAB — CBC
HCT: 35.4 % — ABNORMAL LOW (ref 39.0–52.0)
Hemoglobin: 11.6 g/dL — ABNORMAL LOW (ref 13.0–17.0)
MCH: 30.9 pg (ref 26.0–34.0)
MCHC: 32.8 g/dL (ref 30.0–36.0)
MCV: 94.4 fL (ref 80.0–100.0)
Platelets: 326 10*3/uL (ref 150–400)
RBC: 3.75 MIL/uL — ABNORMAL LOW (ref 4.22–5.81)
RDW: 13.3 % (ref 11.5–15.5)
WBC: 8.8 10*3/uL (ref 4.0–10.5)
nRBC: 0 % (ref 0.0–0.2)

## 2022-10-26 NOTE — Progress Notes (Signed)
   10/26/22 1806  Vitals  Temp 97.7 F (36.5 C)  Pulse Rate 86  Resp 19  BP (!) 153/95  SpO2 93 %  Post Treatment  Dialyzer Clearance Clear  Duration of HD Treatment -hour(s) 4 hour(s)  Hemodialysis Intake (mL) 0 mL  Liters Processed 96  Fluid Removed (mL) 4000 mL  Tolerated HD Treatment Yes  AVG/AVF Arterial Site Held (minutes) 8 minutes  AVG/AVF Venous Site Held (minutes) 8 minutes   Received patient in bed to unit.  Alert and oriented.  Informed consent signed and in chart.   TX duration:4hrs  Patient tolerated well.  Transported back to the room  Alert, without acute distress.  Hand-off given to patient's nurse.   Access used: LUA AVF Access issues: none  Total UF removed: 4L Medication(s) given: none    Na'Shaminy T Madelena Maturin [facility]

## 2022-10-26 NOTE — Assessment & Plan Note (Signed)
Patient presented to the ED with shortness of breath found to be hypoxic with O2 sat 88%. Patients pulse ox continues to be between high 80s- mid 90s on RA. Pulse Oximetry check while ambulating was fluctuating from 80% to 87% on R/A. Patient tolerated with no DOE. Nephrology does not believe he needs more dialysis.  - set up home oxygen  - continue Dulera twice daily with Incruse Ellipta daily, albuterol prn - continuous pulse ox

## 2022-10-26 NOTE — Assessment & Plan Note (Signed)
Patients last echo was 05/2022 which showed an EF of 50 to 55% and findings consistent with diastolic dysfunction. On carvedilol 25 mg BID.  - continue home medications

## 2022-10-26 NOTE — Progress Notes (Signed)
FMTS Brief Progress Note  S: Messaged by nursing patient interested in discharge tonight. Went to bedside with Dr. Fatima Blank. Patient states he is feeling a lot better in terms of his breathing after his HD session. Got 4 L off   O: BP (!) 149/100 (BP Location: Right Arm)   Pulse 85   Temp 98.2 F (36.8 C) (Oral)   Resp 18   Ht 5\' 6"  (1.676 m)   Wt 104.7 kg   SpO2 91%   BMI 37.26 kg/m    General: Well appearing, NAD, awake, alert, responsive to questions Head: Normocephalic atraumatic CV: Regular rate  Respiratory: Clear to ausculation bilaterally in anterior lung fields, chest rises symmetrically,  no increased work of breathing, saturating from 88-90 on room air Abdomen: Soft, non-tender, non-distended, normoactive bowel sounds  Extremities: Moves upper and lower extremities freely, no edema in LE  A/P: Acute hypoxic respiratory failure Patient saturating from 88 to 90% on room air. States that his breathing is much better. We discussed that given he is saturating in low 90s/high 80s on room air we should watch overnight and obtain ambulatory pulse ox in AM. Patient is agreeable to this.  Levin Erp, MD 10/26/2022, 7:58 PM PGY-3, [facility] Night Resident  Please page [phone] with questions.

## 2022-10-26 NOTE — Progress Notes (Signed)
[facility] NEPHROLOGY PROGRESS NOTE  Assessment/ Plan:  # ESRD -outpatient HD orders: [facility], MWF.  4 hours 15 minutes. EDW 103.5 kg.  2K/2 calcium.  Flow rates: 400/autoflow 1.5.  LUE AVF, 15-gauge needles.  Meds: Venofer 50 mg once weekly, Hectorol 5 mcg every treatment, Sensipar 180 mg q. treatment.  Heparin 4100 units bolus -s/p HD yesterday with 4 L UF, regular HD today per MWF schedule.   #SOB, hypoxia, possible pulm edema vs PNA, better with HD -covid negative. -UF as tolerated   # Volume/ hypertension  -will attempt to optimize volume status via HD   #Anemia of Chronic Kidney Disease -Hemoglobin at goal.    #Secondary Hyperparathyroidism/Hyperphosphatemia -Resume Hectorol and Sensipar.  Renvela w/meal, renal diet.   #Hyponatremia -Managing with HD and UF -fluid restriction.  Subjective:  Seen and examined. Completed HD last night, sob better with 4 L UF, plan for HD today.  Objective Vital signs in last 24 hours: Vitals:   10/26/22 0503 10/26/22 0731 10/26/22 1106 10/26/22 1140  BP: (!) 142/88 (!) 157/96 (!) 152/96   Pulse: 77 77 79   Resp: 19 19 18    Temp: 99.6 F (37.6 C) 98.8 F (37.1 C) 98 F (36.7 C)   TempSrc: Oral Oral Oral   SpO2: 94% 96%  96%  Weight: 104.9 kg     Height:       Weight change:   Intake/Output Summary (Last 24 hours) at 10/26/2022 1313 Last data filed at 10/26/2022 0900 Gross per 24 hour  Intake 118 ml  Output 4000 ml  Net -3882 ml       Labs: RENAL PANEL Recent Labs  Lab 10/25/22 1215 10/25/22 1547  NA 130* 132*  K 4.2 4.0  CL 94* 94*  CO2 21* 21*  GLUCOSE 168* 117*  BUN 71* 74*  CREATININE 12.93* 13.36*  CALCIUM 9.3 9.1  PHOS  --  8.3*  ALBUMIN  --  2.9*    Liver Function Tests: Recent Labs  Lab 10/25/22 1547  ALBUMIN 2.9*   No results for input(s): "LIPASE", "AMYLASE" in the last 168 hours. No results for input(s): "AMMONIA" in the last 168 hours. CBC: Recent Labs    03/19/22 0305  03/21/22 0416 05/23/22 0254 05/24/22 0524 09/25/22 1056 10/25/22 1215 10/26/22 0028  HGB 10.3*   < > 12.1* 12.1* 11.1* 11.5* 11.6*  MCV 95.1   < > 92.3 92.6 94 96.2 94.4  FERRITIN 600*  --   --   --   --   --   --    < > = values in this interval not displayed.    Cardiac Enzymes: No results for input(s): "CKTOTAL", "CKMB", "CKMBINDEX", "TROPONINI" in the last 168 hours. CBG: Recent Labs  Lab 10/25/22 2356 10/26/22 0535 10/26/22 1104  GLUCAP 115* 139* 144*    Iron Studies: No results for input(s): "IRON", "TIBC", "TRANSFERRIN", "FERRITIN" in the last 72 hours. Studies/Results: DG Chest Port 1 View  Result Date: 10/25/2022 CLINICAL DATA:  141880 SOB (shortness of breath) 141880 EXAM: PORTABLE CHEST 1 VIEW COMPARISON:  CXR 05/23/22 FINDINGS: No pleural effusion. No pneumothorax. Cardiomegaly. There are prominent bilateral interstitial opacities that are nonspecific and could represent pulmonary edema or multifocal infection. No radiographically apparent displaced rib fractures. Visualized upper abdomen is unremarkable. IMPRESSION: Cardiomegaly with prominent bilateral interstitial opacities that are nonspecific and could represent pulmonary edema or multifocal infection. Electronically Signed   By: Lorenza Cambridge M.D.   On: 10/25/2022 12:44  Medications: Infusions:   Scheduled Medications:  amLODipine  10 mg Oral Daily   carvedilol  25 mg Oral BID WC   Chlorhexidine Gluconate Cloth  6 each Topical Q0600   gabapentin  300 mg Oral Daily   heparin  5,000 Units Subcutaneous Q8H   insulin aspart  0-6 Units Subcutaneous TID WC   loratadine  10 mg Oral QPM   mometasone-formoterol  2 puff Inhalation BID   And   umeclidinium bromide  1 puff Inhalation Daily   multivitamin  1 tablet Oral QHS   sevelamer carbonate  1,600-2,400 mg Oral TID WC    have reviewed scheduled and prn medications.  Physical Exam: General:NAD, comfortable Heart:RRR, s1s2 nl Lungs:clear b/l, no  crackle Abdomen:soft, Non-tender, non-distended Extremities:No edema Dialysis Access: LUE AVF +b/t.   Macoy Rodwell Prasad Willadeen Colantuono 10/26/2022,1:13 PM  LOS: 1 day

## 2022-10-26 NOTE — Consult Note (Addendum)
WOC Nurse Consult Note: Reason for Consult: Consult requested for left leg wound. Wound type: Full thickness wound from hitting an object to left anterior shin; 100% yellow, 5X3cm and lower area separated by a narrow bridge of skin in between, .3X.3cm, 100% yellow; appearance is consistent with probable yellow-brown Iodosorb ointment.  Pt states he was seen at the outpatient wound care center this week and they applied a dressing and told him to leave it in place until his next visit. I was unable to locate the wound center treatment notes in the EMR, however, it does indicate he has another appointment scheduled for 7/25 and he was seen yesterday.  Dressing procedure/placement/frequency: Topical treatment orders provided for bedside nurses to perform as follows: Leave left leg dressing in place; Pt is followed by the outpatient wound care center and they have placed Iodosorb, which is not available in the [facility] formulary, and Pt was told to leave it in place until he returns on 7/25.  Pt states he will follow-up with the outpatient wound care center after discharge.  Please re-consult if further assistance is needed.  Thank-you,  Cammie Mcgee MSN, RN, CWOCN, [place], CNS [phone]

## 2022-10-26 NOTE — TOC Initial Note (Signed)
Transition of Care [facility]) - Initial/Assessment Note    Patient Details  Name: [name] MRN: [id number] Date of Birth: 17-Apr-1965  Transition of [facility]) CM/SW Contact:    Leone Haven, RN Phone Number: 10/26/2022, 11:26 AM  Clinical Narrative:                 From home with wife, ,he has PCP and insurance on file, he states he currently does not have any HH service in place at this time or DME.  He states his mother, [place] Whisenant, his daughter or his SIL will transport him home at [employer], he states his family is his support system.  Pta he is self ambulatory.   Expected Discharge Plan: Home/Self Care Barriers to Discharge: Continued Medical Work up   Patient Goals and CMS Choice Patient states their goals for this hospitalization and ongoing recovery are:: return home with wife   Choice offered to / list presented to : NA      Expected Discharge Plan and Services   Discharge Planning Services: CM Consult Post Acute Care Choice: NA Living arrangements for the past 2 months: Single Family Home                 DME Arranged: N/A DME Agency: NA       HH Arranged: NA          Prior Living Arrangements/Services Living arrangements for the past 2 months: Single Family Home Lives with:: Spouse Patient language and need for interpreter reviewed:: Yes Do you feel safe going back to the place where you live?: Yes      Need for Family Participation in Patient Care: Yes (Comment) Care giver support system in place?: Yes (comment)   Criminal Activity/Legal Involvement Pertinent to Current Situation/Hospitalization: No - Comment as needed  Activities of Daily Living      Permission Sought/Granted Permission sought to share information with : Case Manager Permission granted to share information with : Yes, Verbal Permission Granted              Emotional Assessment Appearance:: Appears stated age Attitude/Demeanor/Rapport: Engaged Affect (typically  observed): Appropriate Orientation: : Oriented to Place, Oriented to  Time, Oriented to Situation, Oriented to Self Alcohol / Substance Use: Not Applicable Psych Involvement: No (comment)  Admission diagnosis:  SOB (shortness of breath) [R06.02] Hypoxia [R09.02] Patient Active Problem List   Diagnosis Date Noted   Acute respiratory failure with hypoxia (HCC) 05/22/2022   (HFpEF) heart failure with preserved ejection fraction (HCC) 05/22/2022   Acute pulmonary edema (HCC) 03/19/2022   Fluid overload 03/18/2022   Tinea pedis of both feet 11/22/2020   Enlarged heart 02/02/2020   ESRD (end stage renal disease) on dialysis (HCC) 01/02/2020   Hypertensive urgency 10/25/2019   Chronic ulcer of right leg (HCC) 10/25/2019   AF (paroxysmal atrial fibrillation) (HCC) 10/25/2019   Bilateral lower extremity edema 10/06/2019   Eustachian tube dysfunction, right 10/06/2019   Chronic anticoagulation 12/11/2018   Elevated troponin 11/02/2018   Diabetic polyneuropathy associated with type 2 diabetes mellitus (HCC) 11/13/2017   Acute renal failure superimposed on chronic kidney disease, on chronic dialysis (HCC) 07/28/2017   Right sided Flank pain 07/27/2017   Weakness 07/27/2017   Myalgia 07/27/2017   Pneumonia of right lung due to infectious organism    Pleural effusion    HTN (hypertension)    Post-operative pain    DMII (diabetes mellitus, type 2) (HCC)    Acute on chronic diastolic  heart failure (HCC)    Encephalopathy    SIRS (systemic inflammatory response syndrome) (HCC)    Chest tube in place    Empyema lung [facility])    Postoperative pain    Pleural effusion on right 05/17/2017   Empyema (HCC) 05/16/2017   Chronic depression 09/20/2015   Recurrent major depressive disorder, in partial remission (HCC) 09/20/2015   Microalbuminuria 01/20/2015   Acute kidney injury (HCC) 12/28/2014   Type 2 diabetes mellitus with hyperglycemia, with long-term current use of insulin (HCC) 12/28/2014   DM  (diabetes mellitus) type II controlled, neurological manifestation (HCC) 12/28/2014   Adjustment disorder with mixed anxiety and depressed mood 12/28/2014   Possible Panic disorder 12/28/2014   Hypertensive emergency without congestive heart failure 12/27/2014   Anxiety 12/23/2014   Class 2 obesity 05/14/2012   Sleep apnea 05/14/2012   Class 2 severe obesity due to excess calories with serious comorbidity and body mass index (BMI) of 38.0 to 38.9 in adult [facility]) 05/14/2012   Severe uncontrolled hypertension 03/28/2011   PCP:  Patient, No Pcp Per Pharmacy:   [facility] [address] [place] - [address] [place] [address] Phone: [phone] Fax: [phone]  [facility] [address] [place] [address] Phone: [phone] Fax: [phone]     Social Determinants of Health (SDOH) Social History: SDOH Screenings   Food Insecurity: No Food Insecurity (03/19/2022)  Housing: Low Risk  (03/19/2022)  Transportation Needs: No Transportation Needs (03/19/2022)  Utilities: Not At Risk (03/19/2022)  Social Connections: Unknown (08/22/2021)   Received from [facility]  Tobacco Use: Low Risk  (10/25/2022)   SDOH Interventions:     Readmission Risk Interventions     No data to display

## 2022-10-26 NOTE — Progress Notes (Signed)
FMTS Brief Progress Note  S:Went to assess patient at HD with Dr. Fatima Blank. Patient just completed his HD session and had about 4 L taken off. He does not states he has a huge improvement in shortness of breath. Was saturating in high 90s on 6 L [place]. I was able to titrate patietn off O2 and he was saturating in mid 90s with no work of breathing.  He states he has a wound on his left anterior leg which he has been going to wound care for. States he is having some increased pain. Has not gotten his home tramadol yet.   O: BP (!) 168/92 (BP Location: Right Arm)   Pulse 94   Temp 98.2 F (36.8 C) (Oral)   Resp 16   Ht 5\' 6"  (1.676 m)   Wt 109 kg   SpO2 94%   BMI 38.79 kg/m    General: NAD, awake, alert, responsive to questions Head: Normocephalic atraumatic CV: Regular rate and rhythm  Respiratory: Clear to ausculation bilaterally on anterior lung exam, no iWOB off of oxygen saturating in mid 90s Abdomen: Soft, non-tender, non-distended, normoactive bowel sounds  Extremities: Moves upper and lower extremities freely, no edema in LE, L leg wound with bandage with serosanguinous/pus strikethrough, slightly warmer than other leg, no surrounding erythema or tenderness, NVI  A/P: Acute Hypoxic Respiratory Failure Seems somewhat improved after HD session and was able to wean oxygen. Will monitor needs overnight. Trops 92>94. Awaiting repeat EKG-messaged RN.   HTN Slowly improving after HD.   Left leg wound Monitor for systemic signs of infection or erythema. Does have some warmth and strikethrough. Discussed with RN to given patient home tramadol.  Levin Erp, MD 10/26/2022, 2:30 AM PGY-3, [facility] Night Resident  Please page [phone] with questions.

## 2022-10-26 NOTE — Progress Notes (Addendum)
Daily Progress Note Intern Pager: [phone]  Patient name: [name] Medical record number: [id number] Date of birth: 1965/09/11 Age: 57 y.o. Gender: male  Primary Care Provider: Patient, No Pcp Per Consultants: Nephrology  Code Status: Full  Pt Overview and Major Events to Date:  7/18- admitted, HD  7/19-   Assessment and Plan: Patient is a 57 yo M with pertitent PMHx of ESRD on diaylsis, HTN, HFpEF, DMII admitted for acute respiratory failure with hypoxia. Patient reports breathing is improved from yesterday.  [facility]     * (Principal) Acute respiratory failure with hypoxia [facility])     Patient presented to the ED with shortness of breath found to be  hypoxic with O2 sat 88% patient started on 3 L nasal cannula satting in  the high 90s. Patient states breathing is much better today. Was on 2L [place],  though unsure if needed as pulse ox was off.  - Daily weights - Dulera twice daily with Incruse Ellipta daily, albuterol prn - continuous pulse ox - wean O2 as tolerated        HTN (hypertension)     History of resistant hypertension. Missed dialysis session on 7/17.   Home medications include Norvasc 10 mg and Coreg 25 mg. Patient BP have  downtrended from admission.  - Continue home medications        DMII (diabetes mellitus, type 2) (HCC)     Patient reports taking insulin at home, but currently checks his blood  sugars and has not needed much.  Patient has associated polyneuropathy.   Home medications include Humalog 75/25 mix 5 to 10 units twice daily,  gabapentin 300 mg daily. - CBG checks -Very sensitive SSI - gabapentin 300 mg daily        ESRD (end stage renal disease) on dialysis [facility])     Patient on MWF dialysis but missed session (7/17) due to pain in left  leg and increased cough. ~4L off yesterday in HD. - Nephrology following, appreciate assistance - Renvela 3 times daily - RFP ordered, awaiting results  - EKG ordered, waiting for results          (HFpEF) heart failure with preserved ejection fraction (HCC)     Patients last echo was 05/2022 which showed an EF of 50 to 55% and  findings consistent with diastolic dysfunction. On carvedilol 25 mg BID.  - continue home medications     FEN/GI: Renal diet PPx: SubQ heparin Dispo:Home pending improvement  Subjective:  Patient reports he is feeling much better today. Reports decrease in shortness of breath. He states his leg is still hurting him but its not unbearable. No other complaints at this time   Objective: Temp:  [98 F (36.7 C)-99.6 F (37.6 C)] 98 F (36.7 C) (07/19 1106) Pulse Rate:  [77-96] 79 (07/19 1106) Resp:  [16-36] 18 (07/19 1106) BP: (105-202)/(88-118) 152/96 (07/19 1106) SpO2:  [85 %-100 %] 96 % (07/19 1140) Weight:  [104.9 kg-109 kg] 104.9 kg (07/19 0503) Physical Exam: General: resting comfortably in bed Cardiovascular: RRR, no M/R/G Respiratory: Crackles bilaterally, though improved from yesterday. On 2L [place]. NWOB Abdomen: NTND Extremities: Bandage to L leg wound, upon removal wound is covered in yellow-brown ointment   Laboratory: Most recent CBC Lab Results  Component Value Date   WBC 8.8 10/26/2022   HGB 11.6 (L) 10/26/2022   HCT 35.4 (L) 10/26/2022   MCV 94.4 10/26/2022   PLT 326 10/26/2022  Most recent BMP    Latest Ref Rng & Units 10/25/2022    3:47 PM  BMP  Glucose 70 - 99 mg/dL 324   BUN 6 - 20 mg/dL 74   Creatinine 4.01 - 1.24 mg/dL 02.72   Sodium 536 - 644 mmol/L 132   Potassium 3.5 - 5.1 mmol/L 4.0   Chloride 98 - 111 mmol/L 94   CO2 22 - 32 mmol/L 21   Calcium 8.9 - 10.3 mg/dL 9.1      Imaging/Diagnostic Tests: No new imaging   Penne Lash, MD 10/26/2022, 12:36 PM  PGY-1, [facility] FPTS Intern pager: [phone], text pages welcome Secure chat group [facility] Teaching Service

## 2022-10-27 ENCOUNTER — Other Ambulatory Visit (HOSPITAL_COMMUNITY): Payer: Self-pay

## 2022-10-27 DIAGNOSIS — J9601 Acute respiratory failure with hypoxia: Secondary | ICD-10-CM | POA: Diagnosis not present

## 2022-10-27 LAB — RENAL FUNCTION PANEL
Albumin: 3 g/dL — ABNORMAL LOW (ref 3.5–5.0)
Anion gap: 14 (ref 5–15)
BUN: 27 mg/dL — ABNORMAL HIGH (ref 6–20)
CO2: 27 mmol/L (ref 22–32)
Calcium: 9.8 mg/dL (ref 8.9–10.3)
Chloride: 92 mmol/L — ABNORMAL LOW (ref 98–111)
Creatinine, Ser: 6.15 mg/dL — ABNORMAL HIGH (ref 0.61–1.24)
GFR, Estimated: 10 mL/min — ABNORMAL LOW (ref >60)
Glucose, Bld: 154 mg/dL — ABNORMAL HIGH (ref 70–99)
Phosphorus: 4.8 mg/dL — ABNORMAL HIGH (ref 2.5–4.6)
Potassium: 3.9 mmol/L (ref 3.5–5.1)
Sodium: 133 mmol/L — ABNORMAL LOW (ref 135–145)

## 2022-10-27 LAB — GLUCOSE, CAPILLARY
Glucose-Capillary: 147 mg/dL — ABNORMAL HIGH (ref 70–99)
Glucose-Capillary: 146 mg/dL — ABNORMAL HIGH (ref 70–99)
Glucose-Capillary: 178 mg/dL — ABNORMAL HIGH (ref 70–99)

## 2022-10-27 LAB — HEPATITIS B SURFACE ANTIBODY, QUANTITATIVE: Hep B S AB Quant (Post): 1996 m[IU]/mL

## 2022-10-27 MED ORDER — BREZTRI AEROSPHERE 160-9-4.8 MCG/ACT IN AERO
2.0000 | INHALATION_SPRAY | Freq: Two times a day (BID) | RESPIRATORY_TRACT | 5 refills | Status: AC
  Filled 2022-10-27: qty 10.7, 30d supply, fill #0

## 2022-10-27 NOTE — Discharge Instructions (Addendum)
Dear [name],  Thank you for letting us participate in your care. You were hospitalized for increased shortness of breath and diagnosed with Acute respiratory failure with hypoxia (HCC). You were treated with dialysis and showed great improvement in your breathing.   POST-HOSPITAL & CARE INSTRUCTIONS Please make an appointment with your PCP to discuss your insulin dosage and management of diabetes Go to your follow up appointments (listed below)   DOCTOR'S APPOINTMENT   Future Appointments  Date Time Provider Department Center  11/01/2022  2:30 PM Camelia Phenes, DO [facility]  11/28/2022  2:30 PM Freddie Breech, DPM [facility]  11/29/2022 10:30 AM Alfonse Spruce, MD [facility] None     Take care and be well!  Family Medicine Teaching Service Inpatient Team [place]  [facility]  [address] [place], [place] [address] [phone]      Take your insulin based on your blood sugars, not just what is on the pen.  Oxygen was delivered to your home.

## 2022-10-27 NOTE — Progress Notes (Signed)
Pulse Oximetry check at rest was 93 % on R/A. Pulse Oximetry check while ambulating was fluctuating from 80% to 87% on R/A. Patient tolerated with no DOE.

## 2022-10-27 NOTE — Progress Notes (Signed)
[name]  Assessment/ Plan:  # ESRD -outpatient HD orders: [facility], MWF.  4 hours 15 minutes. EDW 103.5 kg.  2K/2 calcium.  Flow rates: 400/autoflow 1.5.  LUE AVF, 15-gauge needles.  Meds: Venofer 50 mg once weekly, Hectorol 5 mcg every treatment, Sensipar 180 mg q. treatment.  Heparin 4100 units bolus -s/p HD on 7/18 and 7/19 with 4 L UF in each HD.  Significant clinical improvement.  Plan for next dialysis on Monday.  #SOB, hypoxia, possible pulm edema vs PNA, better with HD -covid negative. Clinically improved with dialysis.  He is on room air.   # Volume/ hypertension  -will attempt to optimize volume status via HD   #Anemia of Chronic Kidney Disease -Hemoglobin at goal.    #Secondary Hyperparathyroidism/Hyperphosphatemia -Resume Hectorol and Sensipar.  Renvela w/meal, renal diet.   #Hyponatremia -Managing with HD and UF -fluid restriction.  Ok to discharge from renal perspective.  Subjective:  Seen and examined.  Reports feeling much better.  Denies nausea, vomiting, chest pain, shortness of breath.  He is on room air.  Tolerated dialysis well yesterday with 4 L UF. Objective Vital signs in last 24 hours: Vitals:   10/27/22 0632 10/27/22 0740 10/27/22 0742 10/27/22 0757  BP:  (!) 163/103 (!) 163/103   Pulse: 77 81 78   Resp:  18 18   Temp:  98.2 F (36.8 C) 98.2 F (36.8 C)   TempSrc:  Oral Oral   SpO2: 91% 94% 97% 95%  Weight:      Height:       Weight change: -5.2 kg  Intake/Output Summary (Last 24 hours) at 10/27/2022 0918 Last data filed at 10/27/2022 0900 Gross per 24 hour  Intake 120 ml  Output 4000 ml  Net -3880 ml       Labs: RENAL PANEL Recent Labs  Lab 10/25/22 1215 10/25/22 1547 10/26/22 1035 10/27/22 0306  NA 130* 132* 134* 133*  K 4.2 4.0 4.2 3.9  CL 94* 94* 95* 92*  CO2 21* 21* 28 27  GLUCOSE 168* 117* 144* 154*  BUN 71* 74* 43* 27*  CREATININE 12.93* 13.36* 8.76* 6.15*  CALCIUM 9.3 9.1  9.3 9.8  PHOS  --  8.3* 6.3* 4.8*  ALBUMIN  --  2.9* 2.8* 3.0*    Liver Function Tests: Recent Labs  Lab 10/25/22 1547 10/26/22 1035 10/27/22 0306  ALBUMIN 2.9* 2.8* 3.0*   No results for input(s): "LIPASE", "AMYLASE" in the last 168 hours. No results for input(s): "AMMONIA" in the last 168 hours. CBC: Recent Labs    03/19/22 0305 03/21/22 0416 05/23/22 0254 05/24/22 0524 09/25/22 1056 10/25/22 1215 10/26/22 0028  HGB 10.3*   < > 12.1* 12.1* 11.1* 11.5* 11.6*  MCV 95.1   < > 92.3 92.6 94 96.2 94.4  FERRITIN 600*  --   --   --   --   --   --    < > = values in this interval not displayed.    Cardiac Enzymes: No results for input(s): "CKTOTAL", "CKMB", "CKMBINDEX", "TROPONINI" in the last 168 hours. CBG: Recent Labs  Lab 10/26/22 0535 10/26/22 1104 10/26/22 1833 10/26/22 2102 10/27/22 0636  GLUCAP 139* 144* 111* 116* 147*    Iron Studies: No results for input(s): "IRON", "TIBC", "TRANSFERRIN", "FERRITIN" in the last 72 hours. Studies/Results: DG Chest Port 1 View  Result Date: 10/26/2022 CLINICAL DATA:  Shortness of breath EXAM: PORTABLE CHEST 1 VIEW COMPARISON:  10/25/2022 FINDINGS: Cardiac enlargement. Shallow  inspiration. Mild perihilar infiltration is similar to prior study, possible mild edema or early multifocal pneumonia. No progression. No pleural effusions. No pneumothorax. Mediastinal contours appear intact. Calcified and tortuous aorta. IMPRESSION: Cardiac enlargement with perihilar infiltration similar to prior study. Electronically Signed   By: Burman Nieves M.D.   On: 10/26/2022 19:26   DG Chest Port 1 View  Result Date: 10/25/2022 CLINICAL DATA:  141880 SOB (shortness of breath) 141880 EXAM: PORTABLE CHEST 1 VIEW COMPARISON:  CXR 05/23/22 FINDINGS: No pleural effusion. No pneumothorax. Cardiomegaly. There are prominent bilateral interstitial opacities that are nonspecific and could represent pulmonary edema or multifocal infection. No radiographically  apparent displaced rib fractures. Visualized upper abdomen is unremarkable. IMPRESSION: Cardiomegaly with prominent bilateral interstitial opacities that are nonspecific and could represent pulmonary edema or multifocal infection. Electronically Signed   By: Lorenza Cambridge M.D.   On: 10/25/2022 12:44    Medications: Infusions:   Scheduled Medications:  amLODipine  10 mg Oral Daily   carvedilol  25 mg Oral BID WC   Chlorhexidine Gluconate Cloth  6 each Topical Q0600   gabapentin  300 mg Oral Daily   heparin  5,000 Units Subcutaneous Q8H   insulin aspart  0-6 Units Subcutaneous TID WC   loratadine  10 mg Oral QPM   mometasone-formoterol  2 puff Inhalation BID   And   umeclidinium bromide  1 puff Inhalation Daily   multivitamin  1 tablet Oral QHS   sevelamer carbonate  1,600-2,400 mg Oral TID WC    have reviewed scheduled and prn medications.  Physical Exam: General:NAD, comfortable Heart:RRR, s1s2 nl Lungs:clear b/l, no crackle Abdomen:soft, Non-tender, non-distended Extremities:No edema Dialysis Access: LUE AVF +b/t.   Tamma Brigandi Prasad Kervens Roper 10/27/2022,9:18 AM  LOS: 2 days

## 2022-10-27 NOTE — Progress Notes (Signed)
SATURATION QUALIFICATIONS: (This note is used to comply with regulatory documentation for home oxygen)  Patient Saturations on Room Air at Rest = 93%  Patient Saturations on Room Air while Ambulating = 95%  Patient Saturations on Room Air at Rest after ambulating= 99%  Please briefly explain why patient needs home oxygen: Patient does not qualify for home oxygen.

## 2022-10-27 NOTE — Plan of Care (Signed)

## 2022-10-27 NOTE — Plan of Care (Signed)
  Problem: Education: Goal: Ability to describe self-care measures that may prevent or decrease complications (Diabetes Survival Skills Education) will improve Outcome: Progressing   Problem: Coping: Goal: Ability to adjust to condition or change in health will improve Outcome: Progressing   Problem: Metabolic: Goal: Ability to maintain appropriate glucose levels will improve Outcome: Progressing   Problem: Nutritional: Goal: Maintenance of adequate nutrition will improve Outcome: Progressing   Problem: Clinical Measurements: Goal: Ability to maintain clinical measurements within normal limits will improve Outcome: Progressing Goal: Will remain free from infection Outcome: Progressing Goal: Respiratory complications will improve Outcome: Progressing Goal: Cardiovascular complication will be avoided Outcome: Progressing

## 2022-10-27 NOTE — Progress Notes (Signed)
[name], [name] ([id number]) [id number] Page 1 of 4 Visit Report for 10/25/2022 Abuse Risk Screen Details Patient Name: Date of Service: [name], [name] 10/25/2022 9:30 A M Medical Record Number: [id number] Patient Account Number: [id number] Date of Birth/Sex: Treating RN: 1965-04-29 (57 y.o. [name] Primary Care [name]: [name] Other Clinician: Referring [name]: Treating [name]/Extender: [name], [name] in Treatment: 0 Abuse Risk Screen Items Answer ABUSE RISK SCREEN: Has anyone close to you tried to hurt or harm you recentlyo No Do you feel uncomfortable with anyone in your familyo No Has anyone forced you do things that you didnt want to doo No Electronic Signature(s) Signed: 10/25/2022 6:18:14 PM By: Shawn Stall RN, BSN Entered By: Shawn Stall on 10/25/2022 10:25:20 -------------------------------------------------------------------------------- Activities of Daily Living Details Patient Name: Date of Service: [name], [name] 10/25/2022 9:30 A M Medical Record Number: [id number] Patient Account Number: [id number] Date of Birth/Sex: Treating RN: 24-Oct-1965 (57 y.o. [name] Primary Care [name]: [name] Other Clinician: Referring [name]: Treating [name]/Extender: [name], [name] in Treatment: 0 Activities of Daily Living Items Answer Activities of Daily Living (Please select one for each item) Drive Automobile Completely Able T Medications ake Completely Able Use T elephone Completely Able Care for Appearance Completely Able Use T oilet Completely Able Bath / Shower Completely Able Dress Self Completely Able Feed Self Completely Able Walk Completely Able Get In / Out Bed Completely Able Housework Completely Able Prepare Meals Completely Able Handle Money Completely Able Shop for Self Completely Able Electronic Signature(s) Signed: 10/25/2022 6:18:14 PM By:  Shawn Stall RN, BSN Entered By: Shawn Stall on 10/25/2022 10:25:25 [name], [name] Bash ([id number]) [id number] Page 2 of 4 -------------------------------------------------------------------------------- Education Screening Details Patient Name: Date of Service: [name], [name] 10/25/2022 9:30 A M Medical Record Number: [id number] Patient Account Number: [id number] Date of Birth/Sex: Treating RN: March 29, 1966 (57 y.o. [name] Primary Care Jwan Hornbaker: [name] Other Clinician: Referring [name]: Treating [name]/Extender: [name] in Treatment: 0 Primary Learner Assessed: Patient Learning Preferences/Education Level/Primary Language Learning Preference: Explanation, Demonstration, Printed Material Highest Education Level: High School Preferred Language: English Cognitive Barrier Language Barrier: No Translator Needed: No Memory Deficit: No Emotional Barrier: No Cultural/Religious Beliefs Affecting Medical Care: No Physical Barrier Impaired Vision: No Impaired Hearing: No Decreased Hand dexterity: No Knowledge/Comprehension Knowledge Level: High Comprehension Level: High Ability to understand written instructions: High Ability to understand verbal instructions: High Motivation Anxiety Level: Calm Cooperation: Cooperative Education Importance: Acknowledges Need Interest in Health Problems: Asks Questions Perception: Coherent Willingness to Engage in Self-Management High Activities: Readiness to Engage in Self-Management High Activities: Electronic Signature(s) Signed: 10/25/2022 6:18:14 PM By: Shawn Stall RN, BSN Entered By: Shawn Stall on 10/25/2022 10:25:30 -------------------------------------------------------------------------------- Fall Risk Assessment Details Patient Name: Date of Service: [name], [name] 10/25/2022 9:30 A M Medical Record Number: [id number] Patient Account Number:  [id number] Date of Birth/Sex: Treating RN: 12/05/1965 (56 y.o. [name] Primary Care [name]: [name] Other Clinician: Referring [name]: Treating [name]/Extender: [name], [name] in Treatment: 0 Fall Risk Assessment Items Have you had 2 or more falls in the last 12 monthso 0 No [name], [name] ([id number]) [phone] [id number] Page 3 of 4 Have you had any fall that resulted in injury in the last 12 monthso 0 No FALLS RISK SCREEN History of falling - immediate or within 3 months 0 No Secondary diagnosis (Do you have 2 or more medical diagnoseso) 0 No Ambulatory aid  None/bed rest/wheelchair/nurse 0 Yes Crutches/cane/walker 0 No Furniture 0 No Intravenous therapy Access/Saline/Heparin Lock 0 No Gait/Transferring Normal/ bed rest/ wheelchair 0 Yes Weak (short steps with or without shuffle, stooped but able to lift head while walking, may seek 0 No support from furniture) Impaired (short steps with shuffle, may have difficulty arising from chair, head down, impaired 0 No balance) Mental Status Oriented to own ability 0 Yes Electronic Signature(s) Signed: 10/25/2022 6:18:14 PM By: Shawn Stall RN, BSN Entered By: Shawn Stall on 10/25/2022 10:25:36 -------------------------------------------------------------------------------- Foot Assessment Details Patient Name: Date of Service: [name], [name] 10/25/2022 9:30 A M Medical Record Number: [id number] Patient Account Number: [id number] Date of Birth/Sex: Treating RN: 07/10/1965 (57 y.o. [name] Primary Care Stana Bayon: [name] Other Clinician: Referring [name]: Treating [name]/Extender: [name], [name] in Treatment: 0 Foot Assessment Items Site Locations + = Sensation present, - = Sensation absent, C = Callus, U = Ulcer R = Redness, W = Warmth, M = Maceration, PU = Pre-ulcerative lesion F = Fissure, S = Swelling, D =  Dryness Assessment Right: Left: Other Deformity: No No Prior Foot Ulcer: No No Prior Amputation: No No Charcot Joint: No No Ambulatory Status: Ambulatory Without Help Gait: Steady [name], [name] ([id number]) [id number] Page 4 of 4 Electronic Signature(s) Signed: 10/25/2022 6:18:14 PM By: Shawn Stall RN, BSN Entered By: Shawn Stall on 10/25/2022 10:25:45 -------------------------------------------------------------------------------- Nutrition Risk Screening Details Patient Name: Date of Service: [name], [name] 10/25/2022 9:30 A M Medical Record Number: [id number] Patient Account Number: [id number] Date of Birth/Sex: Treating RN: 08-11-1965 (57 y.o. [name] Primary Care [name]: [name] Other Clinician: Referring [name]: Treating Fedra Lanter/Extender: [name], [name] in Treatment: 0 Height (in): Weight (lbs): Body Mass Index (BMI): Nutrition Risk Screening Items Score Screening NUTRITION RISK SCREEN: I have an illness or condition that made me change the kind and/or amount of food I eat 2 Yes I eat fewer than two meals per day 0 No I eat few fruits and vegetables, or milk products 0 No I have three or more drinks of beer, liquor or wine almost every day 0 No I have tooth or mouth problems that make it hard for me to eat 0 No I don't always have enough money to buy the food I need 0 No I eat alone most of the time 0 No I take three or more different prescribed or over-the-counter drugs a day 1 Yes Without wanting to, I have lost or gained 10 pounds in the last six months 0 No I am not always physically able to shop, cook and/or feed myself 0 No Nutrition Protocols Good Risk Protocol Provide education on elevated blood Moderate Risk Protocol 0 sugars and impact on wound healing, as applicable High Risk Proctocol Risk Level: Moderate Risk Score: 3 Electronic Signature(s) Signed: 10/25/2022 6:18:14 PM By:  Shawn Stall RN, BSN Entered By: Shawn Stall on 10/25/2022 10:25:41

## 2022-10-27 NOTE — Discharge Summary (Addendum)
Family Medicine Teaching [facility] Discharge Summary  Patient name: [name] Medical record number: [id number] Date of birth: 08-30-1965 Age: 57 y.o. Gender: male Date of Admission: 10/25/2022  Date of Discharge: 10/27/22  Admitting Physician: Penne Lash, MD  Primary Care Provider: Patient, No Pcp Per Consultants: Nephrology   Indication for Hospitalization: acute respiratory failure with hypoxia   Brief Hospital Course:  Lamarkus Noxon is a 57 y.o.male with a history of ESRD on dialysis, resistant hypertension, DMII, HFpEF who was admitted to the medicine teaching Service at [facility] for acute respiratory failure. His hospital course is detailed below:   Acute Hypoxic Respiratoy Failure Patient presented to ED with increased shortness of breath from missing dialysis session. Patient was dialyzed during admission, and reported increased improvement. Patient able to ambulate with no dyspnea and saturations 95-100%.   ESRD on Dialysis Patient has history of ESRD on MWF dialysis.  Patient received a another dialysis session due to missing dialysis on Wednesday.  Per nephrology, patient is stable for discharge and will follow MWF schedule.   HFpEF Patient last echo was 05/2022 which showed ejection fraction of 50 to 55% and findings consistent with diastolic dysfunction. Patient received home dose of Coreg (25 mg twice daily) during admission and was discharged on these medications.  DMII Patient discharged on home insulin recommended to follow-up with PCP regarding diabetes management.  Left leg wound  Patient had had left leg wound upon arrival which was bandaged.  He was seen by wound who recommended he follow-up outpatient with wound care center.  He states he was prescribed doxycycline and for the wound.  Doxycycline was discontinued during his admission and he was instructed to restart and finish course upon discharge.  Other chronic conditions were medically managed  with home medications and formulary alternatives as necessary (HTN)  PCP Follow-up Recommendations: Patient taking insulin at home, did not require much in the hospital. Consider adjusting dosage Patient has a wound on Left leg for which he sees wound clinic. He was prescribed doxycycline for this prior to admission. Follow up on wound and adherence to Abx.   Patient on Dulera and Incruse Ellipta daily, follow up regarding respiratory function  Patient has resistant hypertension and is also managed by nephrologist, consider optimizing antihypertensives    Discharge Diagnoses/Problem List:  Principal Problem:   Acute respiratory failure with hypoxia (HCC) Active Problems:   DMII (diabetes mellitus, type 2) (HCC)   Disposition: Home  Discharge Condition: Stable  Discharge Exam:  General: Well-appearing, NAD, resting comfortably in bed Cardiovascular: RRR, no M/R/G Respiratory: Slight crackle heard in bilateral lung, no increased work of breathing Abdomen: Soft nontender nondistended Extremities: 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, no edema in lower extremities   Significant Procedures: None  Significant Labs and Imaging:  Recent Labs  Lab 10/26/22 0028  WBC 8.8  HGB 11.6*  HCT 35.4*  PLT 326   Recent Labs  Lab 10/26/22 1035 10/27/22 0306  NA 134* 133*  K 4.2 3.9  CL 95* 92*  CO2 28 27  GLUCOSE 144* 154*  BUN 43* 27*  CREATININE 8.76* 6.15*  CALCIUM 9.3 9.8  PHOS 6.3* 4.8*  ALBUMIN 2.8* 3.0*     Results/Tests Pending at Time of Discharge: None  Discharge Medications:  Allergies as of 10/27/2022   No Known Allergies      Medication List     STOP taking these medications    ketoconazole 2 % cream Commonly known as: NIZORAL   pantoprazole 40  MG tablet Commonly known as: PROTONIX   predniSONE 10 MG tablet Commonly known as: DELTASONE   tiZANidine 4 MG tablet Commonly known as: ZANAFLEX       TAKE these medications    Accu-Chek Softclix  Lancets lancets Use as directed.  For insulin-dependent type 2 diabetes Monitor  glucose 3 times a day What changed:  how much to take how to take this when to take this   albuterol (2.5 MG/3ML) 0.083% nebulizer solution Commonly known as: PROVENTIL Take 3 mLs (2.5 mg total) by nebulization every 6 (six) hours as needed for wheezing or shortness of breath.   albuterol 108 (90 Base) MCG/ACT inhaler Commonly known as: Ventolin HFA Inhale 2 puffs into the lungs every 6 (six) hours as needed for wheezing or shortness of breath.   amLODipine 10 MG tablet Commonly known as: NORVASC Take 1 tablet (10 mg total) by mouth daily.   BD Pen Needle Nano U/F 32G X 4 MM Misc Generic drug: Insulin Pen Needle Use as directed up to four times daily What changed:  how much to take how to take this when to take this   Breztri Aerosphere 160-9-4.8 MCG/ACT Aero Generic drug: Budeson-Glycopyrrol-Formoterol Inhale 2 puffs into the lungs in the morning and at bedtime.   calcitRIOL 0.25 MCG capsule Commonly known as: ROCALTROL Take 1 capsule (0.25 mcg total) by mouth every Monday, Wednesday, and Friday.   carvedilol 25 MG tablet Commonly known as: COREG Take 1 tablet (25 mg total) by mouth 2 (two) times daily with a meal.   doxycycline 100 MG capsule Commonly known as: VIBRAMYCIN Take 100 mg by mouth 2 (two) times daily.   gabapentin 300 MG capsule Commonly known as: NEURONTIN Take 1 capsule (300 mg total) by mouth daily.   glucose blood test strip Use as instructed For insulin-dependent type 2 diabetes Monitor  glucose 3 times a day What changed:  how much to take how to take this when to take this   HumaLOG Mix 75/25 KwikPen (75-25) 100 UNIT/ML KwikPen Generic drug: Insulin Lispro Prot & Lispro Inject 5-10 Units into the skin 2 (two) times daily with a meal. What changed: how much to take   levocetirizine 5 MG tablet Commonly known as: XYZAL Take 1 tablet (5 mg total) by  mouth every evening.   multivitamin Tabs tablet Take 1 tablet by mouth at bedtime.   sevelamer carbonate 800 MG tablet Commonly known as: RENVELA Take 2-3 tablets (1,600-2,400 mg total) by mouth 3 (three) times daily with meals.   traMADol 50 MG tablet Commonly known as: ULTRAM Take 50 mg by mouth 2 (two) times daily as needed.   Vitamin D (Ergocalciferol) 1.25 MG (50000 UNIT) Caps capsule Commonly known as: DRISDOL Take 50,000 Units by mouth 3 (three) times a week.        Discharge Instructions: Please refer to Patient Instructions section of EMR for full details.  Patient was counseled important signs and symptoms that should prompt return to medical care, changes in medications, dietary instructions, activity restrictions, and follow up appointments.   Follow-Up Appointments:   Glendale Chard, DO 10/27/2022, 4:33 PM PGY-2, [facility]

## 2022-10-28 NOTE — Discharge Planning (Signed)
[facility] Patient Discharge Orders- [facility] CLINIC: [place] [place]  Patient's name: [name] Admit/DC Dates: 10/25/2022 - 10/27/2022  Discharge Diagnoses: Acute hypoxic respiratory failure   ESRD on dialysis  Aranesp: Given: No   Date and amount of last dose: N/A  Last Hgb: 11.6 PRBC's Given: No Date/# of units: N/A ESA dose for discharge: none IV Iron dose at discharge: venofer 50mg  weekly  Heparin change: No  EDW Change: No New EDW: N/A  Bath Change: No  Access intervention/Change: No Details:  Hectorol/Calcitriol change: No  Discharge Labs: Calcium 9.8 Phosphorus 4.8 Albumin 3.0 K+ 3.9  IV Antibiotics: No Details:  On Coumadin?: No Last INR: Next INR: Managed By:   OTHER/APPTS/LAB ORDERS:    D/C Meds to be reconciled by nurse after every discharge.  Completed By: Rogers Blocker, PA-C 10/28/2022, 10:53 AM  [facility] Pager: [phone]    Reviewed by: MD:______ RN_______

## 2022-10-29 NOTE — Progress Notes (Signed)
Boydstun, Lorraine ([id number]) [id number] Page 1 of 12 Visit Report for 10/25/2022 Chief Complaint Document Details Patient Name: Date of Service: [name], [name] 10/25/2022 9:30 A M Medical Record Number: [id number] Patient Account Number: [id number] Date of Birth/Sex: Treating RN: [birth year] (57 y.o. M) Primary Care Provider: Virl Son Other Clinician: Referring Provider: Treating Provider/Extender: Wardell Heath, Alease Frame in Treatment: 0 Information Obtained from: Patient Chief Complaint 08/04/2019; patient is here for review of the wound on the right lateral lower leg 10/25/2022; patient arrives in clinic today for review of a wound on the left anterior mid tibia area that has been present for approximately 2 months Electronic Signature(s) Signed: 10/29/2022 4:18:32 AM By: Baltazar Najjar MD Entered By: Baltazar Najjar on 10/25/2022 10:37:25 -------------------------------------------------------------------------------- Debridement Details Patient Name: Date of Service: [name], [name] 10/25/2022 9:30 A M Medical Record Number: [id number] Patient Account Number: [id number] Date of Birth/Sex: Treating RN: 06/17/1965 (57 y.o. [name] Primary Care Provider: Virl Son Other Clinician: Referring Provider: Treating Provider/Extender: Wardell Heath, Alease Frame in Treatment: 0 Debridement Performed for Assessment: Wound #4 Left,Anterior Lower Leg Performed By: Physician Maxwell Caul., MD Debridement Type: Chemical/Enzymatic/Mechanical Agent Used: gauze and wound cleanser Severity of Tissue Pre Debridement: Limited to breakdown of skin Level of Consciousness (Pre-procedure): Awake and Alert Pre-procedure Verification/Time Out No Taken: Percent of Wound Bed Debrided: Bleeding: None Response to Treatment: Procedure was tolerated well Level of Consciousness (Post- Awake and Alert procedure): Post Debridement Measurements of  Total Wound Length: (cm) 4.5 Width: (cm) 2.5 Depth: (cm) 0.1 Volume: (cm) 0.884 Character of Wound/Ulcer Post Debridement: Requires Further Debridement Severity of Tissue Post Debridement: Limited to breakdown of skin Post Procedure Diagnosis Same as Pre-procedure Electronic Signature(s) Signed: 10/25/2022 6:18:14 PM By: Shawn Stall RN, BSN Signed: 10/29/2022 4:18:32 AM By: Baltazar Najjar MD Trabucco, Jove ([id number]) AM By: Baltazar Najjar MD [phone].pdf Page 2 of 12 Signed: 10/29/2022 4:18:32 Entered By: Shawn Stall on 10/25/2022 10:23:03 -------------------------------------------------------------------------------- HPI Details Patient Name: Date of Service: [name], [name] 10/25/2022 9:30 A M Medical Record Number: [id number] Patient Account Number: [id number] Date of Birth/Sex: Treating RN: 05-19-1965 (57 y.o. M) Primary Care Provider: Virl Son Other Clinician: Referring Provider: Treating Provider/Extender: Wardell Heath, Alease Frame in Treatment: 0 History of Present Illness HPI Description: ADMISSION 08/04/2019 This is a 57 year old man who traumatized his right lateral lower leg while going up an escalator at the mall sometime in December 2020. He was referred to vascular surgery and on 04/14/2019 he saw Dr. Raynelle Fanning. He was felt to have "palpable pulses and normal noninvasive studies" although I cannot see these results. He was back in his primary doctor's office on 07/30/2019 for a nonhealing right lower extremity wound. He was referred to wound care I think in [facility] but his insurance was out of network. A CNS was ordered. X-ray was done that was negative. He has been using wound cleanser and a Band-Aid. Past medical history is actually quite extensive and includes chronic kidney disease stage IV, chronic diastolic heart failure, atrial fibrillation, poorly controlled hypertension, poorly controlled diabetes with peripheral  neuropathy and a recent hemoglobin A1c of 11.5, depression, lymphedema and sleep apnea ABI in our clinic was 1.1 in the right 5/6; this wound was initially traumatized while the patient was going down on an escalator. Arrived in clinic last week with a completely nonviable surface. He required mechanical debridement and we have been using Iodoflex under compression. Apparently the compression wraps fell down.  He also has poorly controlled diabetes 5/15; traumatic/contusion wound to the right lateral lower leg. We have been using Iodoflex to clean at the surface of this which was completely nonviable along with mechanical debridements. He is under compression. 09/02/2019 upon evaluation today patient appears to be doing well with regard to his wound on his right lateral leg. He has been tolerating the dressing changes without complication. Fortunately there is no signs of active infection at this time. No fevers, chills, nausea, vomiting, or diarrhea. With that being said there is some necrotic tissue around the edges of the wound that is and require some sharp debridement today. That was discussed with the patient and his mother in the office today prior to debridement to which she did consent. 09/09/2019 upon evaluation today patient appears to be making progress in regard to his wound. Fortunately the wound is not nearly as deep as what it was. It is measuring a little bit larger due to having to clear some of the edges away but again that is necessary to get this to heal. Fortunately there is no evidence of active infection at this time. No fevers, chills, nausea, vomiting, or diarrhea. 09/16/2019 on evaluation today patient appears to be doing excellent in regard to his wound currently. In fact I do not see anything that we can need to debride today which is great news. The wound bed looks healthy and the edges of the wound look like they are doing great. Overall I feel like we are at a good place and  headed in the proper direction. 09/23/2019 upon evaluation today patient appears to be doing okay in regard to his wound. This is not measuring any smaller it is about the same. With that being said he tells me that his wrap actually slid down he ended up having to take this off he was on a trip and did not have his Velcro compression with him. With that being said I do not see any signs of anything significantly worsening which is good news. 09/30/2019 upon evaluation today patient appears to be doing a little bit better in regard to his leg ulcer. He has been tolerating the dressing changes without complication. Fortunately there is no signs of active infection at this time. No fevers, chills, nausea, vomiting, or diarrhea. 10/14/2019 upon evaluation today patient's wound appears to be healthier. Overall but unfortunately is measuring a little bit bigger. With that being said I do think that we do need to still continue to wrap his leg. Fortunately there is no signs of active infection at this time which is great news. 11/18/2019 upon evaluation today patient actually appears to be doing decently well with regard to his wound. He has been tolerating the dressing changes without complication. Fortunately there is no signs of active infection has been using just Neosporin at this point since he was in the hospital. He did have a venous Doppler study in the hospital though I cannot see the results that was from November 04, 2019. Nonetheless overall I am pleased with the fact the patient does seem to be doing better. 11/25/2019 on evaluation today patient appears to be doing well with regard to his wounds. He has been tolerating the dressing changes without complication. Fortunately there is no signs of active infection at this time. No fevers, chills, nausea, vomiting, or diarrhea. I do believe that he does not appear to be showing any signs of worsening although he also seems to be having some trouble getting  this to proceed in a good fashion towards healing. 12/02/2019 on evaluation today patient actually appears to be doing not quite as well in regard to his wounds. Both have some slough noted on the surface of the wound. Unfortunately he is continuing to have some issues here with pain as well in the anterior portion which I think is an issue. Fortunately there is no signs of active infection at this time which is great news. No fevers, chills, nausea, vomiting, or diarrhea. 12/30/2019 upon evaluation today patient unfortunately has not been seen in the past month we are just now seeing him back for reevaluation today last time I saw him was August 25. At that point he had a lateral wound on his leg and an anterior wound which which is starting. The lateral has healed unfortunately the anterior looks worse. With that being said the patient unfortunately seems to be having some issues today that I cannot completely explain by just the way the wound appears. The wound itself is not really hot to touch and to be honest I would not necessarily assume infection upon initial inspection. With that being said he is acting very lethargic, was weak walking into the clinic according to his mother who is with him today, his blood pressure is actually in a normal range at around 110/73 which is unusual for him he normally runs extremely high in the 180s for systolic. His blood glucose was 227 here in the clinic today respiratory rate was 18 and his temperature was 97.7. Pulse was right around 70 when I checked this manually. Nonetheless although his vital signs and normal circumstances would be reassuring I am still kind of concerned just because of the way he is acting. I did question him about medications that he has taken he told me that he took 2 Tylenol and tramadol this morning he also tells me he took 2 tramadol last night. With that being said I do not believe that just taking the tramadol is accounting for what  is going on as well based on what I see today. I discussed all this with his mother as well as the patient face-to-face in the [place], Phares ([id number]) [id number] Page 3 of 12 office today and subsequently I think I would recommend further evaluation at the ER as I really feel like there is something going on here that I cannot identify just here in the clinic. 01/13/2020 upon evaluation today patient presents for follow-up concerning ongoing issues with his right leg ulcer. After I last saw him 2 weeks ago he was actually sent to the ER where subsequently he ended up being placed on dialysis. He is doing better although he still seems very tired I think still in general he is doing much better. Fortunately there is no signs of active infection at this time. No fevers, chills, nausea, vomiting, or diarrhea. 01/26/2020; this is a patient with a large irregular albeit superficial wound on the right anterior lower leg. He has been using Santyl. He has recently been initiated on dialysis after we sent him to the ER earlier this month. He describes the wound is being very painful. By his description this started as a small raised area that rapidly expanded. He does not have a lot of obvious venous issues. Looking back through the records this was described as initially traumatic in April. He has seen vascular surgery and not felt to have an arterial issue 02/03/2020 upon evaluation today patient actually appears to be doing  a little better in regard to his ulcer although it has spread inferiorly the central portion of the wound and towards the upper has actually new skin growth. Fortunately there is no signs of active infection at this time which is great news. 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 With that being said some of the more significant issues here such as angiosarcoma does not appear to be likely based on pathology report. It seems that were more in the arterial or possible calciphylaxis realm based on what I am seeing visually. 02/10/2020 on evaluation today patient actually making some progress in regard to his leg ulcer. There is a lot of new skin growth which is excellent he tells me he still having a lot of pain but in general this seems to be showing signs of dramatic improvement which is great news. Overall I think that he is continuing to tolerate the Santyl along with ABD pad and roll gauze to secure. 02/17/2020 on evaluation today patient appears to be doing well in regard to his wound. He has been tolerating the dressing changes without complication. Fortunately there is no signs of active infection at this time which is great news and overall very pleased with where he stands today. 02/24/2020 upon evaluation today patient appears to be doing very well with regard to his leg ulcer. He has been tolerating the dressing changes with the Santyl without complication. Fortunately there is no signs of active infection at this time. No fevers, chills, nausea, vomiting, or diarrhea. 03/09/2020 upon evaluation today patient appears to be doing very well in regard to his ulcer. He has been tolerating the dressing changes without complication. Fortunately there is no signs of active infection at this time. Overall I think he is making great progress there is no depth to the wound and overall the patient seems to be feeling much better from a pain perspective as well. I am very pleased on all that he is doing so well. 03/23/2020 on evaluation today patient appears to be doing well with regard to his lower  extremity ulcer. This is showing signs of excellent improvement he has just a very small area on the inferior portion of the wound that is still open. He still has pain but not nearly as bad as it was. 03/29/2020; this patient I do not usually see. He has a history of an extensive wound on the right anterior tibia area he has been using Santyl and is on Ace wrap at home with an ABD over the area. He changes the dressing himself. He still has 1 small area left at the most distal part of this wound almost all the rest of it is healed with scar tissue. In looking at the overall wound area he did have a few blisters which often means excessive fluid 04/13/2020 on evaluation today patient appears to be doing well with regard to his leg in fact this appears to be completely healed which is great news. Fortunately there is no signs of active infection at this time which is also great news. No fevers, chills, nausea, vomiting, or diarrhea. READMISSION 10/25/2022 This is a 57 year old man that we had extensively in the clinic from April 2021 to January 2022. He  had a traumatic wound on the right anterior lower leg in the setting of type 2 diabetes chronic venous insufficiency and lymphedema. His ABIs on this assessment were 1.1. At that point he had stage IV chronic renal failure he has since gone on to start dialysis. The wound we are currently looking at is a wound on the left anterior lower leg. The patient says this started sometime in May when he hit the leg on a bed frame. Not clear exactly what they are doing for this currently but they were instructed by their primary doctor to use antibacterial soap and topical antibiotics. He is also on doxycycline. The wound is not healing and they are here for a review of this. The patient complains of pain in the area but I could not get a history of claudication. He arrived in our clinic walking accompanied by his mother. His O2 sat was 77% on room air. He did not  have any specific complaints of shortness of breath or chest pain. [facility]. As noted he is on dialysis. He is a non-smoker Psychologist, prison and probation services) Signed: 10/29/2022 4:18:32 AM By: Baltazar Najjar MD Entered By: Baltazar Najjar on 10/25/2022 10:42:14 -------------------------------------------------------------------------------- Physical Exam Details Patient Name: Date of Service: [name], [name] 10/25/2022 9:30 A M Medical Record Number: [id number] Patient Account Number: [id number] Date of Birth/Sex: Treating RN: 01/28/1966 (57 y.o. M) Primary Care Provider: Virl Son Other Clinician: Referring Provider: Treating Provider/Extender: Wardell Heath, Alease Frame in Treatment: 0 Constitutional Patient is hypertensive.Marland Kitchen Respirations regular, non-labored and within target range.. Temperature is normal and within the target range for the patient.. Patient does not look to be in any distress whatsoever. Respiratory Work of breathing appears normal. Dullness at both lung bases right greater than left and decreased air entry. No wheezing his work of breathing appears Dhanani, Tiburcio Bash ([id number]) [id number] Page 4 of 12 normal. Decreased air entry at both bases with dullness to percussion right greater than left. No wheezing. Cardiovascular Blowing systolic murmur. JVP is elevated no sacral edema. No gallops minimal lower extremity edema. Pedal pulses palpable at the dorsalis pedis and posterior tibia. His foot is warm papillary refill is difficult to visualize. Notes Wound exam; the area in question is on the left anterior mid tibia small area with subcutaneous tissue noted but the rest of this is covered in black eschar. Very tender around the area. No clear evidence of infection Electronic Signature(s) Signed: 10/29/2022 4:18:32 AM By: Baltazar Najjar MD Entered By: Baltazar Najjar on  10/25/2022 10:45:14 -------------------------------------------------------------------------------- Physician Orders Details Patient Name: Date of Service: [name], [name] 10/25/2022 9:30 A M Medical Record Number: [id number] Patient Account Number: [id number] Date of Birth/Sex: Treating RN: 1966-01-30 (57 y.o. [name] Primary Care Provider: Virl Son Other Clinician: Referring Provider: Treating Provider/Extender: Wardell Heath, Alease Frame in Treatment: 0 Verbal / Phone Orders: No Diagnosis Coding Follow-up Appointments ppointment in 1 week. - Dr. Mikey Bussing 230pm 11/01/2022 Room 7 Thursday Return A Other: - [facility] will call you to set up an appointment for vascular testing. Go to the ED for respiratory failure, SHOB, requiring oxygen in clinic. Anesthetic (In clinic) Topical Lidocaine 5% applied to wound bed Bathing/ Shower/ Hygiene May shower and wash wound with soap and water. - with dressing changes. Edema Control - Lymphedema / SCD / Other Elevate legs to the level of the heart or above for 30 minutes  daily and/or when sitting for 3-4 times a day throughout the day. Avoid standing for long periods of time. Exercise regularly Moisturize legs daily. Additional Orders / Instructions Follow Nutritious Diet - closely monitor blood sugar. increase protein levels. Wound Treatment Wound #4 - Lower Leg Wound Laterality: Left, Anterior Cleanser: Wound Cleanser (DME) (Generic) 1 x Per Day/30 Days Discharge Instructions: Cleanse the wound with wound cleanser prior to applying a clean dressing using gauze sponges, not tissue or cotton balls. Cleanser: [place] Ancillary Kit - 15 Day Supply (DME) (Generic) 1 x Per Day/30 Days Discharge Instructions: Use supplies as instructed; Kit contains: (15) Saline Bullets; (15) 3x3 Gauze; 15 pr Gloves Peri-Wound Care: Skin Prep (DME) (Generic) 1 x Per Day/30 Days Discharge Instructions: Use skin prep as directed Prim Dressing:  Iodosorb Gel 10 (gm) Tube 1 x Per Day/30 Days ary Discharge Instructions: Apply to wound bed. Use iodoflex in clinic. Secondary Dressing: Zetuvit Plus Silicone Border Dressing 4x4 (in/in) (DME) (Generic) 1 x Per Day/30 Days Discharge Instructions: Apply silicone border over primary dressing as directed. Services and Therapies rterial Studies- Bilateral - VVS formal arterial studies ABIs with TBIs related to wound to left leg. ICD 10 E11.622 CPT code A Patient Medications Milhouse, Yonathan ([id number]) [id number] Page 5 of 12 llergies: No Known Drug Allergies A Notifications Medication Indication Start End applied prior to 10/25/2022 lidocaine debridement. DOSE topical 5 % cream - cream topical once daily Electronic Signature(s) Signed: 10/25/2022 6:18:14 PM By: Shawn Stall RN, BSN Signed: 10/29/2022 4:18:32 AM By: Baltazar Najjar MD Entered By: Shawn Stall on 10/25/2022 10:26:32 Prescription 10/25/2022 -------------------------------------------------------------------------------- Aikey, Dustin Folks MD Patient Name: Provider: 1965-05-29 [id number] Date of Birth: NPI#: [id number] Sex: DEA #: [id number] Phone #: License #: [id number] UPN: Patient Address: [address] [facility] [place], [place] [address] [address] [place], [place] [address] [phone] Allergies No Known Drug Allergies Provider's Orders rterial Studies- Bilateral - VVS formal arterial studies ABIs with TBIs related to wound to left leg. ICD 10 E11.622 CPT code A Hand Signature: Date(s): Electronic Signature(s) Signed: 10/25/2022 6:18:14 PM By: Shawn Stall RN, BSN Signed: 10/29/2022 4:18:32 AM By: Baltazar Najjar MD Entered By: Shawn Stall on 10/25/2022 10:26:33 -------------------------------------------------------------------------------- Problem List Details Patient Name: Date of Service: [name],  [name] 10/25/2022 9:30 A M Medical Record Number: [id number] Patient Account Number: [id number] Date of Birth/Sex: Treating RN: Aug 12, 1965 (57 y.o. M) Primary Care Provider: Virl Son Other Clinician: Referring Provider: Treating Provider/Extender: Tawanna Cooler in Treatment: 0 Active Problems ICD-10 Encounter Code Description Active Date MDM Diagnosis Fritzler, Kayode ([id number]) [id number] Page 6 of 12 [id number] Non-pressure chronic ulcer of other part of left lower leg with other specified 10/25/2022 No Yes severity E11.622 Type 2 diabetes mellitus with other skin ulcer 10/25/2022 No Yes E11.22 Type 2 diabetes mellitus with diabetic chronic kidney disease 10/25/2022 No Yes R09.02 Hypoxemia 10/25/2022 No Yes Inactive Problems Resolved Problems Electronic Signature(s) Signed: 10/29/2022 4:18:32 AM By: Baltazar Najjar MD Entered By: Baltazar Najjar on 10/25/2022 10:37:47 -------------------------------------------------------------------------------- Progress Note Details Patient Name: Date of Service: [name], [name] 10/25/2022 9:30 A M Medical Record Number: [id number] Patient Account Number: [id number] Date of Birth/Sex: Treating RN: 09-01-1965 (57 y.o. M) Primary Care Provider: Virl Son Other Clinician: Referring Provider: Treating Provider/Extender: Wardell Heath, Alease Frame in Treatment: 0 Subjective Chief Complaint Information obtained from Patient 08/04/2019; patient is here for review of the wound on the right  lateral lower leg 10/25/2022; patient arrives in clinic today for review of a wound on the left anterior mid tibia area that has been present for approximately 2 months History of Present Illness (HPI) ADMISSION 08/04/2019 This is a 57 year old man who traumatized his right lateral lower leg while going [place] an escalator at the mall sometime in December 2020. He was referred to vascular surgery and on 04/14/2019 he  saw Dr. Raynelle Fanning. He was felt to have "palpable pulses and normal noninvasive studies" although I cannot see these results. He was back in his primary doctor's office on 07/30/2019 for a nonhealing right lower extremity wound. He was referred to wound care I think in [facility] but his insurance was out of network. A CNS was ordered. X-ray was done that was negative. He has been using wound cleanser and a Band-Aid. Past medical history is actually quite extensive and includes chronic kidney disease stage IV, chronic diastolic heart failure, atrial fibrillation, poorly controlled hypertension, poorly controlled diabetes with peripheral neuropathy and a recent hemoglobin A1c of 11.5, depression, lymphedema and sleep apnea ABI in our clinic was 1.1 in the right 5/6; this wound was initially traumatized while the patient was going down on an escalator. Arrived in clinic last week with a completely nonviable surface. He required mechanical debridement and we have been using Iodoflex under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 Daily  - tea; Financial Concerns: No; Food, Clothing or Shelter Needs: No; Support System Lacking: No; Transportation Concerns: No Psychologist, prison and probation services) Signed: 10/25/2022 6:18:14 PM By: Shawn Stall RN, BSN Signed: 10/29/2022 4:18:32 AM By: Baltazar Najjar MD Entered By: Shawn Stall on 10/25/2022 10:25:15 -------------------------------------------------------------------------------- SuperBill Details Patient Name: Date of Service: [name], [name] 10/25/2022 Medical Record Number: [id number] Patient Account Number: [id number] Date of Birth/Sex: Treating RN: [birth year] (57 y.o. [name] Primary Care Provider: Virl Son Other Clinician: Referring Provider: Treating Provider/Extender: Wardell Heath, Alease Frame in Treatment: 0 Barren, Tiburcio Bash ([id number]) [id number] Page 12 of 12 Diagnosis Coding ICD-10 Codes Code Description [id number] Non-pressure chronic ulcer of other part of left lower leg with other specified severity E11.622 Type 2 diabetes mellitus with other skin ulcer E11.22 Type 2 diabetes mellitus with diabetic chronic kidney disease R09.02 Hypoxemia Facility Procedures : CPT4 Code: 35573220  Description: [phone] - WOUND CARE VISIT-LEV 5 EST PT Modifier: Quantity: 1 Physician Procedures : CPT4 Code Description Modifier [id number] [phone] - WC PHYS LEVEL 5 - EST PT ICD-10 Diagnosis Description 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verity E11.622 Type 2 diabetes mellitus with other skin ulcer  E11.22 Type 2 diabetes mellitus with diabetic chronic kidney disease R09.02 Hypoxemia Quantity: 1 Electronic Signature(s) Signed: 10/29/2022 4:18:32 AM By: Baltazar Najjar MD Entered By: Baltazar Najjar on 10/25/2022 10:49:23

## 2022-10-29 NOTE — Progress Notes (Signed)
[name], [name] ([id number]) [id number] Page 1 of 10 Visit Report for 10/25/2022 Allergy List Details Patient Name: Date of Service: [name], [name] 10/25/2022 9:30 A M Medical Record Number: [id number] Patient Account Number: [id number] Date of Birth/Sex: Treating RN: Jul 13, 1965 (57 y.o. [name] Primary Care Kendrew Paci: Virl Son Other Clinician: Referring Colleene Swarthout: Treating Ernie Kasler/Extender: Wardell Heath, Babette Relic Weeks in Treatment: 0 Allergies Active Allergies No Known Drug Allergies Allergy Notes Electronic Signature(s) Signed: 10/25/2022 6:18:14 PM By: Shawn Stall RN, BSN Entered By: Shawn Stall on 10/25/2022 10:25:09 -------------------------------------------------------------------------------- Arrival Information Details Patient Name: Date of Service: [name], [name] 10/25/2022 9:30 A M Medical Record Number: [id number] Patient Account Number: [id number] Date of Birth/Sex: Treating RN: August 06, 1965 (57 y.o. [name] Primary Care Felicity Penix: Virl Son Other Clinician: Referring Preeya Cleckley: Treating Denetria Luevanos/Extender: Tawanna Cooler in Treatment: 0 Visit Information Patient Arrived: Ambulatory Arrival Time: 09:43 Accompanied By: mother Transfer Assistance: None Patient Identification Verified: Yes Secondary Verification Process Completed: Yes Patient Requires Transmission-Based Precautions: No Patient Has Alerts: Yes Patient Alerts: ABI in clinic L [place] 7/24 History Since Last Visit Added or deleted any medications: No Any new allergies or adverse reactions: No Had a fall or experienced change in activities of daily living that may affect risk of falls: No Signs or symptoms of abuse/neglect since last visito No Hospitalized since last visit: No Implantable device outside of the clinic excluding cellular tissue based products placed in the center since last visit: No Has Dressing in Place as Prescribed:  No Electronic Signature(s) Signed: 10/25/2022 6:18:14 PM By: Shawn Stall RN, BSN Entered By: Shawn Stall on 10/25/2022 10:24:39 Laviolette, [name] ([id number]) [id number] Page 2 of 10 -------------------------------------------------------------------------------- Clinic Level of Care Assessment Details Patient Name: Date of Service: [name], [name] 10/25/2022 9:30 A M Medical Record Number: [id number] Patient Account Number: [id number] Date of Birth/Sex: Treating RN: 30-Sep-1965 (57 y.o. [name] Primary Care Cesily Cuoco: Virl Son Other Clinician: Referring Rachele Lamaster: Treating Maisha Bogen/Extender: Wardell Heath, Alease Frame in Treatment: 0 Clinic Level of Care Assessment Items TOOL 2 Quantity Score X- 1 0 Use when only an EandM is performed on the INITIAL visit ASSESSMENTS - Nursing Assessment / Reassessment X- 1 20 General Physical Exam (combine w/ comprehensive assessment (listed just below) when performed on new pt. evals) X- 1 25 Comprehensive Assessment (HX, ROS, Risk Assessments, Wounds Hx, etc.) ASSESSMENTS - Wound and Skin A ssessment / Reassessment X - Simple Wound Assessment / Reassessment - one wound 1 5 []  - 0 Complex Wound Assessment / Reassessment - multiple wounds X- 1 10 Dermatologic / Skin Assessment (not related to wound area) ASSESSMENTS - Ostomy and/or Continence Assessment and Care []  - 0 Incontinence Assessment and Management []  - 0 Ostomy Care Assessment and Management (repouching, etc.) PROCESS - Coordination of Care X - Simple Patient / Family Education for ongoing care 1 15 []  - 0 Complex (extensive) Patient / Family Education for ongoing care X- 1 10 Staff obtains Chiropractor, Records, T Results / Process Orders est []  - 0 Staff telephones HHA, Nursing Homes / Clarify orders / etc []  - 0 Routine Transfer to another Facility (non-emergent condition) []  - 0 Routine Hospital Admission (non-emergent condition) X-  1 15 New Admissions / Manufacturing engineer / Ordering NPWT Apligraf, etc. , X- 1 20 Emergency Hospital Admission (emergent condition) X- 1 10 Simple Discharge Coordination []  - 0 Complex (extensive) Discharge Coordination PROCESS - Special Needs []  - 0 Pediatric / Minor Patient Management []  -  0 Isolation Patient Management []  - 0 Hearing / Language / Visual special needs []  - 0 Assessment of Community assistance (transportation, D/C planning, etc.) []  - 0 Additional assistance / Altered mentation []  - 0 Support Surface(s) Assessment (bed, cushion, seat, etc.) INTERVENTIONS - Wound Cleansing / Measurement X- 1 5 Wound Imaging (photographs - any number of wounds) []  - 0 Wound Tracing (instead of photographs) X- 1 5 Simple Wound Measurement - one wound []  - 0 Complex Wound Measurement - multiple wounds X- 1 5 Simple Wound Cleansing - one wound []  - 0 Complex Wound Cleansing - multiple wounds INTERVENTIONS - Wound Dressings X - Small Wound Dressing one or multiple wounds 1 10 Tritch, Loran ([id number]) [id number] Page 3 of 10 []  - 0 Medium Wound Dressing one or multiple wounds []  - 0 Large Wound Dressing one or multiple wounds []  - 0 Application of Medications - injection INTERVENTIONS - Miscellaneous []  - 0 External ear exam []  - 0 Specimen Collection (cultures, biopsies, blood, body fluids, etc.) []  - 0 Specimen(s) / Culture(s) sent or taken to Lab for analysis []  - 0 Patient Transfer (multiple staff / Nurse, adult / Similar devices) []  - 0 Simple Staple / Suture removal (25 or less) []  - 0 Complex Staple / Suture removal (26 or more) []  - 0 Hypo / Hyperglycemic Management (close monitor of Blood Glucose) X- 1 15 Ankle / Brachial Index (ABI) - do not check if billed separately Has the patient been seen at the hospital within the last three years: Yes Total Score: 170 Level Of Care: New/Established - Level 5 Electronic  Signature(s) Signed: 10/25/2022 6:18:14 PM By: Shawn Stall RN, BSN Entered By: Shawn Stall on 10/25/2022 10:27:26 -------------------------------------------------------------------------------- Encounter Discharge Information Details Patient Name: Date of Service: [name], [name] 10/25/2022 9:30 A M Medical Record Number: [id number] Patient Account Number: [id number] Date of Birth/Sex: Treating RN: [birth year] (57 y.o. [name] Primary Care Jinna Weinman: Virl Son Other Clinician: Referring Dalan Cowger: Treating Julieth Tugman/Extender: Tawanna Cooler in Treatment: 0 Encounter Discharge Information Items Post Procedure Vitals Discharge Condition: Stable Temperature (F): 98.5 Ambulatory Status: Ambulatory Pulse (bpm): 96 Discharge Destination: Emergency Room Respiratory Rate (breaths/min): 20 Telephoned: No Blood Pressure (mmHg): 189/97 Orders Sent: Yes Accompanied By: mother Schedule Follow-up Appointment: Yes Clinical Summary of Care: Notes Per Harue Pribble remove 02 to RA sats slowly dropping and holding at 87%. Per Dr. Leanord Hawking no need to go via 911 EMS, advise mother and patient to go to the ED related to [facility] and fluid overload. Electronic Signature(s) Signed: 10/25/2022 6:18:14 PM By: Shawn Stall RN, BSN Entered By: Shawn Stall on 10/25/2022 10:31:00 -------------------------------------------------------------------------------- Lower Extremity Assessment Details Patient Name: Date of Service: [name], [name] 10/25/2022 9:30 A M Delman, [name] ([id number]) [id number] Page 4 of 10 Medical Record Number: [id number] Patient Account Number: [id number] Date of Birth/Sex: Treating RN: 1965-08-29 (57 y.o. [name] Primary Care Levin [name]: Virl Son Other Clinician: Referring Cymone Yeske: Treating Kimmy Totten/Extender: Wardell Heath, Babette Relic Weeks in Treatment: 0 Edema Assessment Assessed: Kyra Searles: Yes] [Right: No] Edema: [Left:  N] [Right: o] Calf Left: Right: Point of Measurement: 36 cm From Medial Instep 42.5 cm Ankle Left: Right: Point of Measurement: 12 cm From Medial Instep 22.6 cm Knee To Floor Left: Right: From Medial Instep 45 cm Vascular Assessment Pulses: Dorsalis Pedis Palpable: [Left:Yes] Posterior Tibial Palpable: [Left:Yes] Extremity colors, hair growth, and conditions: Extremity Color: [Left:Normal] Hair Growth on Extremity: [Left:No] Temperature of Extremity: [Left:Warm] Capillary Refill: [Left:< 3 seconds]  Dependent Rubor: [Left:No] Blanched when Elevated: [Left:No No] Toe Nail Assessment Left: Right: Thick: Yes Discolored: Yes Deformed: Yes Improper Length and Hygiene: No Notes L ABI Emily in clinic. Electronic Signature(s) Signed: 10/25/2022 6:18:14 PM By: Shawn Stall RN, BSN Entered By: Shawn Stall on 10/25/2022 10:25:50 -------------------------------------------------------------------------------- Multi Wound Chart Details Patient Name: Date of Service: [name], [name] 10/25/2022 9:30 A M Medical Record Number: [id number] Patient Account Number: [id number] Date of Birth/Sex: Treating RN: 07-29-1965 (57 y.o. M) Primary Care Japheth Diekman: Virl Son Other Clinician: Referring Dabney Schanz: Treating Adlene Adduci/Extender: Wardell Heath, Babette Relic Weeks in Treatment: 0 Vital Signs Height(in): Pulse(bpm): 98 Weight(lbs): Blood Pressure(mmHg): 189/97 [name], [name] ([id number]) [id number] Page 5 of 10 Body Mass Index(BMI): Temperature(F): 98.5 Respiratory Rate(breaths/min): 16 [4:Photos:] [N/A:N/A] Left, Anterior Lower Leg N/A N/A Wound Location: Gradually Appeared N/A N/A Wounding Event: Diabetic Wound/Ulcer of the Lower N/A N/A Primary Etiology: Extremity Venous Leg Ulcer N/A N/A Secondary Etiology: Lymphedema, Sleep Apnea, N/A N/A Comorbid History: Arrhythmia, Congestive Heart Failure, Hypertension, Peripheral Arterial Disease, Type II  Diabetes, End Stage Renal Disease 08/10/2022 N/A N/A Date Acquired: 0 N/A N/A Weeks of Treatment: Open N/A N/A Wound Status: No N/A N/A Wound Recurrence: 4.5x2.5x0.1 N/A N/A Measurements L x W x D (cm) 8.836 N/A N/A A (cm) : rea 0.884 N/A N/A Volume (cm) : Unable to visualize wound bed N/A N/A Classification: Medium N/A N/A Exudate A mount: Serosanguineous N/A N/A Exudate Type: red, brown N/A N/A Exudate Color: Distinct, outline attached N/A N/A Wound Margin: N/A N/A N/A Necrotic A mount: Eschar, Adherent Slough N/A N/A Necrotic Tissue: Fascia: No N/A N/A Exposed Structures: Fat Layer (Subcutaneous Tissue): No Tendon: No Muscle: No Joint: No Bone: No None N/A N/A Epithelialization: Chemical/Enzymatic/Mechanical N/A N/A Debridement: N/A N/A N/A Instrument: None N/A N/A Bleeding: Debridement Treatment Response: Procedure was tolerated well N/A N/A Post Debridement Measurements L x 4.5x2.5x0.1 N/A N/A W x D (cm) 0.884 N/A N/A Post Debridement Volume: (cm) Excoriation: No N/A N/A Periwound Skin Texture: Induration: No Callus: No Crepitus: No Rash: No Scarring: No Maceration: No N/A N/A Periwound Skin Moisture: Dry/Scaly: No Atrophie Blanche: No N/A N/A Periwound Skin Color: Cyanosis: No Ecchymosis: No Erythema: No Hemosiderin Staining: No Mottled: No Pallor: No Rubor: No Debridement N/A N/A Procedures Performed: Treatment Notes Wound #4 (Lower Leg) Wound Laterality: Left, Anterior Cleanser Wound Cleanser Discharge Instruction: Cleanse the wound with wound cleanser prior to applying a clean dressing using gauze sponges, not tissue or cotton balls. Byram Ancillary Kit - 15 Day Supply Discharge Instruction: Use supplies as instructed; Kit contains: (15) Saline Bullets; (15) 3x3 Gauze; 15 pr Gloves [name], [name] ([id number]) [phone].pdf Page 6 of 10 Peri-Wound Care Skin Prep Discharge Instruction: Use skin prep as  directed Topical Primary Dressing Iodosorb Gel 10 (gm) Tube Discharge Instruction: Apply to wound bed. Use iodoflex in clinic. Secondary Dressing Zetuvit Plus Silicone Border Dressing 4x4 (in/in) Discharge Instruction: Apply silicone border over primary dressing as directed. Secured With Compression Wrap Compression Stockings Facilities manager) Signed: 10/29/2022 4:18:32 AM By: Baltazar Najjar MD Entered By: Baltazar Najjar on 10/25/2022 10:36:11 -------------------------------------------------------------------------------- Multi-Disciplinary Care Plan Details Patient Name: Date of Service: [name], [name] 10/25/2022 9:30 A M Medical Record Number: [id number] Patient Account Number: [id number] Date of Birth/Sex: Treating RN: October 23, 1965 (57 y.o. [name] Primary Care Farron Watrous: Virl Son Other Clinician: Referring Tahjanae Blankenburg: Treating Shakora Nordquist/Extender: Tawanna Cooler in Treatment: 0 Active Inactive Necrotic Tissue Nursing Diagnoses: Impaired tissue integrity related to necrotic/devitalized tissue Goals: Necrotic/devitalized  tissue will be minimized in the wound bed Date Initiated: 10/25/2022 Target Resolution Date: 11/16/2022 Goal Status: Active Interventions: Assess patient pain level pre-, during and post procedure and prior to discharge Treatment Activities: Enzymatic debridement : 10/25/2022 T ordered outside of clinic : 10/25/2022 est Notes: Orientation to the Wound Care Program Nursing Diagnoses: Knowledge deficit related to the wound healing center program Goals: Patient/caregiver will verbalize understanding of the Wound Healing Center Program Date Initiated: 10/25/2022 Target Resolution Date: 11/16/2022 Goal Status: Active Bornhorst, Nissan ([id number]) [id number] Page 7 of 10 Interventions: Provide education on orientation to the wound center Notes: Wound/Skin Impairment Nursing Diagnoses: Knowledge  deficit related to ulceration/compromised skin integrity Goals: Patient/caregiver will verbalize understanding of skin care regimen Date Initiated: 10/25/2022 Target Resolution Date: 11/16/2022 Goal Status: Active Ulcer/skin breakdown will heal within 14 weeks Date Initiated: 10/25/2022 Target Resolution Date: 11/16/2022 Goal Status: Active Interventions: Assess patient/caregiver ability to perform ulcer/skin care regimen upon admission and as needed Assess ulceration(s) every visit Provide education on ulcer and skin care Treatment Activities: Skin care regimen initiated : 10/25/2022 Topical wound management initiated : 10/25/2022 Notes: Electronic Signature(s) Signed: 10/25/2022 6:18:14 PM By: Shawn Stall RN, BSN Entered By: Shawn Stall on 10/25/2022 10:26:40 -------------------------------------------------------------------------------- Pain Assessment Details Patient Name: Date of Service: [name], [name] 10/25/2022 9:30 A M Medical Record Number: [id number] Patient Account Number: [id number] Date of Birth/Sex: Treating RN: Oct 04, 1965 (57 y.o. [name] Primary Care Sherrye Puga: Virl Son Other Clinician: Referring Aaryan Essman: Treating Tamica Covell/Extender: Wardell Heath, Alease Frame in Treatment: 0 Active Problems Location of Pain Severity and Description of Pain Patient Has Paino Yes Site Locations Pain Location: Pain in Ulcers Rate the pain. Current Pain Level: 10 Pain Management and Medication Current Pain Management: Medication: No Cold Application: No [name], [name] ([id number]) [id number] Page 8 of 10 Rest: No Massage: No Activity: No T.E.N.S.: No Heat Application: No Leg drop or elevation: No Is the Current Pain Management Adequate: Adequate How does your wound impact your activities of daily livingo Sleep: No Bathing: No Appetite: No Relationship With Others: No Bladder Continence: No Emotions: No Bowel Continence:  No Work: No Toileting: No Drive: No Dressing: No Hobbies: No Psychologist, prison and probation services) Signed: 10/25/2022 6:18:14 PM By: Shawn Stall RN, BSN Entered By: Shawn Stall on 10/25/2022 10:26:10 -------------------------------------------------------------------------------- Patient/Caregiver Education Details Patient Name: Date of Service: [name], [name] 7/18/2024andnbsp9:30 A M Medical Record Number: [id number] Patient Account Number: [id number] Date of Birth/Gender: Treating RN: [birth year] (57 y.o. [name] Primary Care Physician: Virl Son Other Clinician: Referring Physician: Treating Physician/Extender: Tawanna Cooler in Treatment: 0 Education Assessment Education Provided To: Patient Education Topics Provided Wound/Skin Impairment: Handouts: Caring for Your Ulcer Methods: Explain/Verbal Responses: Reinforcements needed Electronic Signature(s) Signed: 10/25/2022 6:18:14 PM By: Shawn Stall RN, BSN Entered By: Shawn Stall on 10/25/2022 10:26:46 -------------------------------------------------------------------------------- Wound Assessment Details Patient Name: Date of Service: [name], [name] 10/25/2022 9:30 A M Medical Record Number: [id number] Patient Account Number: [id number] Date of Birth/Sex: Treating RN: Mar 28, 1966 (57 y.o. [name] Primary Care Diontae Route: Virl Son Other Clinician: Referring Dejae Bernet: Treating Hank Walling/Extender: Wardell Heath, Babette Relic Weeks in Treatment: 0 Wound Status Wound Number: 4 Primary Diabetic Wound/Ulcer of the Lower Extremity Etiology: Wound Location: Left, Anterior Lower Leg Secondary Venous Leg Ulcer Wounding Event: Gradually Appeared Etiology: Date Acquired: 08/10/2022 [name], [name] ([id number]) [id number] Page 9 of 10 Date Acquired: 08/10/2022 Wound Open Weeks Of Treatment: 0 Status: Clustered Wound: No Comorbid Lymphedema, Sleep Apnea, Arrhythmia,  Congestive Heart  Failure, History: Hypertension, Peripheral Arterial Disease, Type II Diabetes, End Stage Renal Disease Photos Wound Measurements Length: (cm) 4.5 Width: (cm) 2.5 Depth: (cm) 0.1 Area: (cm) 8.836 Volume: (cm) 0.884 % Reduction in Area: % Reduction in Volume: Epithelialization: None Tunneling: No Undermining: No Wound Description Classification: Unable to visualize wound bed Wound Margin: Distinct, outline attached Exudate Amount: Medium Exudate Type: Serosanguineous Exudate Color: red, brown Foul Odor After Cleansing: No Slough/Fibrino No Wound Bed Necrotic Amount: Large (67-100%) Exposed Structure Necrotic Quality: Eschar, Adherent Slough Fascia Exposed: No Fat Layer (Subcutaneous Tissue) Exposed: No Tendon Exposed: No Muscle Exposed: No Joint Exposed: No Bone Exposed: No Periwound Skin Texture Texture Color No Abnormalities Noted: No No Abnormalities Noted: No Callus: No Atrophie Blanche: No Crepitus: No Cyanosis: No Excoriation: No Ecchymosis: No Induration: No Erythema: No Rash: No Hemosiderin Staining: No Scarring: No Mottled: No Pallor: No Moisture Rubor: No No Abnormalities Noted: No Dry / Scaly: No Maceration: No Treatment Notes Wound #4 (Lower Leg) Wound Laterality: Left, Anterior Cleanser Wound Cleanser Discharge Instruction: Cleanse the wound with wound cleanser prior to applying a clean dressing using gauze sponges, not tissue or cotton balls. Byram Ancillary Kit - 15 Day Supply Discharge Instruction: Use supplies as instructed; Kit contains: (15) Saline Bullets; (15) 3x3 Gauze; 15 pr Gloves Peri-Wound Care Skin Prep Discharge Instruction: Use skin prep as directed Topical Primary Dressing [name], [name] ([id number]) [id number] Page 10 of 10 Iodosorb Gel 10 (gm) Tube Discharge Instruction: Apply to wound bed. Use iodoflex in clinic. Secondary Dressing Zetuvit Plus Silicone Border Dressing 4x4  (in/in) Discharge Instruction: Apply silicone border over primary dressing as directed. Secured With Compression Wrap Compression Stockings Facilities manager) Signed: 10/25/2022 6:18:14 PM By: Shawn Stall RN, BSN Entered By: Shawn Stall on 10/25/2022 10:26:02 -------------------------------------------------------------------------------- Vitals Details Patient Name: Date of Service: [name], [name] 10/25/2022 9:30 A M Medical Record Number: [id number] Patient Account Number: [id number] Date of Birth/Sex: Treating RN: 06-08-1965 (57 y.o. [name] Primary Care Ario Mcdiarmid: Virl Son Other Clinician: Referring Satchel Heidinger: Treating Kima Malenfant/Extender: Wardell Heath, Alease Frame in Treatment: 0 Vital Signs Time Taken: 09:57 Temperature (F): 98.5 Source: Stated Pulse (bpm): 98 Source: Stated Respiratory Rate (breaths/min): 16 Blood Pressure (mmHg): 189/97 Reference Range: 80 - 120 mg / dl Airway Pulse Oximetry (%): 77 Inhaled Oxygen Concentration (%): 0 Notes patient c/o SHOB since starting tramadol and doxycyline. patient has not taken his BP medication. Made Perle Gibbon aware of SHOB with sats 77% on RA. Iyan Flett given an order to place on 2L [place] 02. Applied the 02 sats increased only to 88%. Increased the 02 to 3L sats to 90%. Loana Salvaggio made aware and agreed to 3L see patient first for wound care. Sats now have increased to 93%. Electronic Signature(s) Signed: 10/25/2022 6:18:14 PM By: Shawn Stall RN, BSN Entered By: Shawn Stall on 10/25/2022 10:25:05

## 2022-11-01 ENCOUNTER — Encounter (HOSPITAL_BASED_OUTPATIENT_CLINIC_OR_DEPARTMENT_OTHER): Payer: Medicare Other | Attending: Internal Medicine | Admitting: Internal Medicine

## 2022-11-01 DIAGNOSIS — N186 End stage renal disease: Secondary | ICD-10-CM | POA: Insufficient documentation

## 2022-11-01 DIAGNOSIS — G4730 Sleep apnea, unspecified: Secondary | ICD-10-CM | POA: Insufficient documentation

## 2022-11-01 DIAGNOSIS — Z992 Dependence on renal dialysis: Secondary | ICD-10-CM | POA: Insufficient documentation

## 2022-11-01 DIAGNOSIS — I132 Hypertensive heart and chronic kidney disease with heart failure and with stage 5 chronic kidney disease, or end stage renal disease: Secondary | ICD-10-CM | POA: Diagnosis not present

## 2022-11-01 DIAGNOSIS — E1142 Type 2 diabetes mellitus with diabetic polyneuropathy: Secondary | ICD-10-CM | POA: Diagnosis not present

## 2022-11-01 DIAGNOSIS — E11622 Type 2 diabetes mellitus with other skin ulcer: Secondary | ICD-10-CM | POA: Diagnosis not present

## 2022-11-01 DIAGNOSIS — R0902 Hypoxemia: Secondary | ICD-10-CM | POA: Insufficient documentation

## 2022-11-01 DIAGNOSIS — L97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verity: Secondary | ICD-10-CM | POA: Diagnosis not present

## 2022-11-01 DIAGNOSIS — I5032 Chronic diastolic (congestive) heart failure: Secondary | ICD-10-CM | POA: Insufficient documentation

## 2022-11-01 DIAGNOSIS — E1122 Type 2 diabetes mellitus with diabetic chronic kidney disease: Secondary | ICD-10-CM | POA: Diagnosis not present

## 2022-11-01 NOTE — Progress Notes (Signed)
[name], [name] ([id number]) [id number] Page 1 of 10 Visit Report for 11/01/2022 Chief Complaint Document Details Patient Name: Date of Service: [name] 11/01/2022 2:30 PM Medical Record Number: [id number] Patient Account Number: [id number] Date of Birth/Sex: Treating RN: [birth year] (57 y.o. M) Primary Care Provider: PA [name], NO Other Clinician: Referring Provider: Treating Provider/Extender: [name] in Treatment: 1 Information Obtained from: Patient Chief Complaint 08/04/2019; patient is here for review of the wound on the right lateral lower leg 10/25/2022; patient arrives in clinic today for review of a wound on the left anterior mid tibia area that has been present for approximately 2 months Electronic Signature(s) Signed: 11/01/2022 3:41:47 PM By: [name] Corwin DO Entered By: [name] on 11/01/2022 15:28:26 -------------------------------------------------------------------------------- HPI Details Patient Name: Date of Service: [name] 11/01/2022 2:30 PM Medical Record Number: [id number] Patient Account Number: [id number] Date of Birth/Sex: Treating RN: [name] (57 y.o. M) Primary Care Provider: PA [name], NO Other Clinician: Referring Provider: Treating Provider/Extender: [name] in Treatment: 1 History of Present Illness HPI Description: ADMISSION 08/04/2019 This is a 57 year old man who traumatized his right lateral lower leg while going [place] an escalator at the mall sometime in December 2020. He was referred to vascular surgery and on 04/14/2019 he saw Dr. Raynelle [name]. He was felt to have "palpable pulses and normal noninvasive studies" although I cannot see these results. He was back in his primary doctor's office on 07/30/2019 for a nonhealing right lower extremity wound. He was referred to wound care I think in [facility] but his insurance was out of network. A CNS was  ordered. X-ray was done that was negative. He has been using wound cleanser and a Band-Aid. Past medical history is actually quite extensive and includes chronic kidney disease stage IV, chronic diastolic heart failure, atrial fibrillation, poorly controlled hypertension, poorly controlled diabetes with peripheral neuropathy and a recent hemoglobin A1c of 11.5, depression, lymphedema and sleep apnea ABI in our clinic was 1.1 in the right 5/6; this wound was initially traumatized while the patient was going down on an escalator. Arrived in clinic last week with a completely nonviable surface. He required mechanical debridement and we have been using Iodoflex under compression. Apparently the compression wraps fell down. He also has poorly controlled diabetes 5/15; traumatic/contusion wound to the right lateral lower leg. We have been using Iodoflex to clean at the surface of this which was completely nonviable along with mechanical debridements. He is under compression. 09/02/2019 upon evaluation today patient appears to be doing well with regard to his wound on his right lateral leg. He has been tolerating the dressing changes without complication. Fortunately there is no signs of active infection at this time. No fevers, chills, nausea, vomiting, or diarrhea. With that being said there is some necrotic tissue around the edges of the wound that is and require some sharp debridement today. That was discussed with the patient and his mother in the office today prior to debridement to which she did consent. 09/09/2019 upon evaluation today patient appears to be making progress in regard to his wound. Fortunately the wound is not nearly as deep as what it was. It is measuring a little bit larger due to having to clear some of the edges away but again that is necessary to get this to heal. Fortunately there is no evidence of active infection at this time. No fevers, chills, nausea, vomiting, or  diarrhea. 09/16/2019 on evaluation today patient  appears to be doing excellent in regard to his wound currently. In fact I do not see anything that we can need to debride today which is great news. The wound bed looks healthy and the edges of the wound look like they are doing great. Overall I feel like we are at a good place [name] ([id number]) [id number].pdf Page 2 of 10 and headed in the proper direction. 09/23/2019 upon evaluation today patient appears to be doing okay in regard to his wound. This is not measuring any smaller it is about the same. With that being said he tells me that his wrap actually slid down he ended up having to take this off he was on a trip and did not have his Velcro compression with him. With that being said I do not see any signs of anything significantly worsening which is good news. 09/30/2019 upon evaluation today patient appears to be doing a little bit better in regard to his leg ulcer. He has been tolerating the dressing changes without complication. Fortunately there is no signs of active infection at this time. No fevers, chills, nausea, vomiting, or diarrhea. 10/14/2019 upon evaluation today patient's wound appears to be healthier. Overall but unfortunately is measuring a little bit bigger. With that being said I do think that we do need to still continue to wrap his leg. Fortunately there is no signs of active infection at this time which is great news. 11/18/2019 upon evaluation today patient actually appears to be doing decently well with regard to his wound. He has been tolerating the dressing changes without complication. Fortunately there is no signs of active infection has been using just Neosporin at this point since he was in the hospital. He did have a venous Doppler study in the hospital though I cannot see the results that was from November 04, 2019. Nonetheless overall I am pleased with the fact the patient does seem to be doing  better. 11/25/2019 on evaluation today patient appears to be doing well with regard to his wounds. He has been tolerating the dressing changes without complication. Fortunately there is no signs of active infection at this time. No fevers, chills, nausea, vomiting, or diarrhea. I do believe that he does not appear to be showing any signs of worsening although he also seems to be having some trouble getting this to proceed in a good fashion towards healing. 12/02/2019 on evaluation today patient actually appears to be doing not quite as well in regard to his wounds. Both have some slough noted on the surface of the wound. Unfortunately he is continuing to have some issues here with pain as well in the anterior portion which I think is an issue. Fortunately there is no signs of active infection at this time which is great news. No fevers, chills, nausea, vomiting, or diarrhea. 12/30/2019 upon evaluation today patient unfortunately has not been seen in the past month we are just now seeing him back for reevaluation today last time I saw him was August 25. At that point he had a lateral wound on his leg and an anterior wound which which is starting. The lateral has healed unfortunately the anterior looks worse. With that being said the patient unfortunately seems to be having some issues today that I cannot completely explain by just the way the wound appears. The wound itself is not really hot to touch and to be honest I would not necessarily assume infection upon initial inspection. With that being said he is acting  very lethargic, was weak walking into the clinic according to his mother who is with him today, his blood pressure is actually in a normal range at around 110/73 which is unusual for him he normally runs extremely high in the 180s for systolic. His blood glucose was 227 here in the clinic today respiratory rate was 18 and his temperature was 97.7. Pulse was right around 70 when I checked this  manually. Nonetheless although his vital signs and normal circumstances would be reassuring I am still kind of concerned just because of the way he is acting. I did question him about medications that he has taken he told me that he took 2 Tylenol and tramadol this morning he also tells me he took 2 tramadol last night. With that being said I do not believe that just taking the tramadol is accounting for what is going on as well based on what I see today. I discussed all this with his mother as well as the patient face-to-face in the office today and subsequently I think I would recommend further evaluation at the ER as I really feel like there is something going on here that I cannot identify just here in the clinic. 01/13/2020 upon evaluation today patient presents for follow-up concerning ongoing issues with his right leg ulcer. After I last saw him 2 [name] ago he was actually sent to the ER where subsequently he ended up being placed on dialysis. He is doing better although he still seems very tired I think still in general he is doing much better. Fortunately there is no signs of active infection at this time. No fevers, chills, nausea, vomiting, or diarrhea. 01/26/2020; this is a patient with a large irregular albeit superficial wound on the right anterior lower leg. He has been using Santyl. He has recently been initiated on dialysis after we sent him to the ER earlier this month. He describes the wound is being very painful. By his description this started as a small raised area that rapidly expanded. He does not have a lot of obvious venous issues. Looking back through the records this was described as initially traumatic in [name]. He has seen vascular surgery and not felt to have an arterial issue 02/03/2020 upon evaluation today patient actually appears to be doing a little better in regard to his ulcer although it has spread inferiorly the central portion of the wound and towards the upper  has actually new skin growth. Fortunately there is no signs of active infection at this time which is great news. 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 With that being said some of the more significant issues here such as angiosarcoma does not appear to be likely based on pathology report. It seems that were more in the arterial or possible calciphylaxis realm based on what I am seeing visually. 02/10/2020 on evaluation today patient actually making some progress in regard to his leg ulcer. There is a lot of new skin growth which is excellent he tells me he still having a lot of pain but in general this seems to be showing signs of dramatic improvement which is great news. Overall I think that he is continuing to tolerate the Santyl along with ABD pad and  roll gauze to secure. 02/17/2020 on evaluation today patient appears to be doing well in regard to his wound. He has been tolerating the dressing changes without complication. Fortunately there is no signs of active infection at this time which is great news and overall very pleased with where he stands today. 02/24/2020 upon evaluation today patient appears to be doing very well with regard to his leg ulcer. He has been tolerating the dressing changes with the Santyl without complication. Fortunately there is no signs of active infection at this time. No fevers, chills, nausea, vomiting, or diarrhea. 03/09/2020 upon evaluation today patient appears to be doing very well in regard to his ulcer. He has been tolerating the dressing changes without complication. Fortunately there is no signs of active infection  at this time. Overall I think he is making great progress there is no depth to the wound and overall the patient seems to be feeling much better from a pain perspective as well. I am very pleased on all that he is doing so well. 03/23/2020 on evaluation today patient appears to be doing well with regard to his lower extremity ulcer. This is showing signs of excellent improvement he has just a very small area on the inferior portion of the wound that is still open. He still has pain but not nearly as bad as it was. 03/29/2020; this patient I do not usually see. He has a history of an extensive wound on the right anterior tibia area he has been using Santyl and is on Ace wrap at home with an ABD over the area. He changes the dressing himself. He still has 1 small area left at the most distal part of this wound almost all the rest of it is healed with scar tissue. In looking at the overall wound area he did have a few blisters which often means excessive fluid 04/13/2020 on evaluation today patient appears to be doing well with regard to his leg in fact this appears to be completely healed which is great news. Fortunately there is no signs of active infection at this time which is also great news. No fevers, chills, nausea, vomiting, or diarrhea. READMISSION 10/25/2022 This is a 57 year old man that we had extensively in the clinic from [name] 2021 to January 2022. He had a traumatic wound on the right anterior lower leg in the setting of type 2 diabetes chronic venous insufficiency and lymphedema. His ABIs on this assessment were 1.1. At that point he had stage IV chronic renal failure he has since gone on to start dialysis. The wound we are currently looking at is a wound on the left anterior lower leg. The patient says this started sometime in May when he hit the leg on a bed frame. Not clear exactly what they are doing for this currently but they were instructed by their primary doctor to use  antibacterial soap and topical antibiotics. He is also on doxycycline. The wound is not healing and they are here for a review of this. The patient complains of pain in the area but I could not get a history of claudication. He arrived in our clinic walking accompanied by his mother. His O2 sat was 77% on room air. He did not have any specific complaints of shortness of breath or chest pain. He tells me he was at [facility] roughly 2 years ago for a thorough pulmonary review. As noted he is on dialysis. He is a non-smoker [name], [name] ([id number]) [phone].pdf Page 3 of  10 7/25; patient presents for follow-up. He has not obtained his ABIs yet. We gave him the number to call vein and vascular to schedule as the order was sent last week. He has been using Iodoflex to the wound bed on the left anterior leg. He has no issues or complaints. At last clinic visit it was recommended he go to the ED due to his oxygen saturation and he was admitted and had an additional session of dialysis. He feels well today. Electronic Signature(s) Signed: 11/01/2022 3:41:47 PM By: [name] Corwin DO Entered By: [name] on 11/01/2022 15:32:04 -------------------------------------------------------------------------------- Physical Exam Details Patient Name: Date of Service: [name] 11/01/2022 2:30 PM Medical Record Number: [id number] Patient Account Number: [id number] Date of Birth/Sex: Treating RN: [birth year] [name] (57 y.o. M) Primary Care Provider: PA [name], NO Other Clinician: Referring Provider: Treating Provider/Extender: [name] in Treatment: 1 Constitutional respirations regular, non-labored and within target range for patient.. Cardiovascular 2+ dorsalis pedis/posterior tibialis pulses. Psychiatric pleasant and cooperative. Notes T the left anterior mid tibia there is an open wound with nonviable tissue and granulation tissue surrounded by  black eschar. No signs of surrounding soft o tissue infection. Electronic Signature(s) Signed: 11/01/2022 3:41:47 PM By: [name] Corwin DO Entered By: [name] on 11/01/2022 15:32:38 -------------------------------------------------------------------------------- Physician Orders Details Patient Name: Date of Service: [name] 11/01/2022 2:30 PM Medical Record Number: [id number] Patient Account Number: [id number] Date of Birth/Sex: Treating RN: [name] (57 y.o. [name] Primary Care Provider: PA [name], NO Other Clinician: Referring Provider: Treating Provider/Extender: Dorise Bullion [name] in Treatment: 1 Verbal / Phone Orders: No Diagnosis Coding Follow-up Appointments ppointment in 1 week. - Dr. Mikey Bussing Room 7 Tuesday 11/13/22 @ 2:00pm Return A Other: - Call Vein and Vascular to schedule appointment for vascular studies [phone] Anesthetic (In clinic) Topical Lidocaine 5% applied to wound bed Bathing/ Shower/ Hygiene May shower and wash wound with soap and water. - with dressing changes. Edema Control - Lymphedema / SCD / Other Simer, Lacy ([id number]) [id number] Page 4 of 10 Elevate legs to the level of the heart or above for 30 minutes daily and/or when sitting for 3-4 times a day throughout the day. Avoid standing for long periods of time. Exercise regularly Moisturize legs daily. Additional Orders / Instructions Follow Nutritious Diet - closely monitor blood sugar. increase protein levels. Wound Treatment Wound #4 - Lower Leg Wound Laterality: Left, Anterior Cleanser: Wound Cleanser (Generic) 1 x Per Day/30 Days Discharge Instructions: Cleanse the wound with wound cleanser prior to applying a clean dressing using gauze sponges, not tissue or cotton balls. Cleanser: [place]ram Ancillary Kit - 15 Day Supply (Generic) 1 x Per Day/30 Days Discharge Instructions: Use supplies as instructed; Kit contains:  (15) Saline Bullets; (15) 3x3 Gauze; 15 pr Gloves Peri-Wound Care: Skin Prep (Generic) 1 x Per Day/30 Days Discharge Instructions: Use skin prep as directed Prim Dressing: Iodosorb Gel 10 (gm) Tube 1 x Per Day/30 Days ary Discharge Instructions: Apply to wound bed. Use iodoflex in clinic. Secondary Dressing: Zetuvit Plus Silicone Border Dressing 4x4 (in/in) (Generic) 1 x Per Day/30 Days Discharge Instructions: Apply silicone border over primary dressing as directed. Patient Medications llergies: No Known Drug Allergies A Notifications Medication Indication Start End 11/01/2022 lidocaine DOSE topical 5 % ointment - ointment topical once daily Electronic Signature(s) Signed: 11/01/2022 3:41:47 PM By: [name] Corwin DO Previous Signature: 11/01/2022 3:19:04 PM Version By: [name] Corwin DO Entered By: [name] on 11/01/2022 15:32:48 --------------------------------------------------------------------------------  Problem List Details Patient Name: Date of Service: [name] RD, Rene Kocher [name] 11/01/2022 2:30 PM Medical Record Number: [id number] Patient Account Number: [id number] Date of Birth/Sex: Treating RN: [birth year][name] (57 y.o. M) Primary Care Provider: PA [name], NO Other Clinician: Referring Provider: Treating Provider/Extender: [name] Boots, Babette Relic [name] in Treatment: 1 Active Problems ICD-10 Encounter Code Description Active Date MDM Diagnosis 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 10/25/2022 No Yes severity E11.622 Type 2 diabetes mellitus with other skin ulcer 10/25/2022 No Yes E11.22 Type 2 diabetes mellitus with diabetic chronic kidney disease 10/25/2022 No Yes [name], [name] ([id number]) [id number] Page 5 of 10 R09.02 Hypoxemia 10/25/2022 No Yes Inactive Problems Resolved Problems Electronic Signature(s) Signed: 11/01/2022 3:41:47 PM By: [name] Corwin DO Entered By: [name] on  11/01/2022 15:28:13 -------------------------------------------------------------------------------- Progress Note Details Patient Name: Date of Service: [name] 11/01/2022 2:30 PM Medical Record Number: [id number] Patient Account Number: [id number] Date of Birth/Sex: Treating RN: [birth year], [name] (57 y.o. M) Primary Care Provider: PA [name], NO Other Clinician: Referring Provider: Treating Provider/Extender: [name] in Treatment: 1 Subjective Chief Complaint Information obtained from Patient 08/04/2019; patient is here for review of the wound on the right lateral lower leg 10/25/2022; patient arrives in clinic today for review of a wound on the left anterior mid tibia area that has been present for approximately 2 months History of Present Illness (HPI) ADMISSION 08/04/2019 This is a 57 year old man who traumatized his right lateral lower leg while going up an escalator at the mall sometime in December 2020. He was referred to vascular surgery and on 04/14/2019 he saw Dr. Raynelle [name]. He was felt to have "palpable pulses and normal noninvasive studies" although I cannot see these results. He was back in his primary doctor's office on 07/30/2019 for a nonhealing right lower extremity wound. He was referred to wound care I think in [facility] but his insurance was out of network. A CNS was ordered. X-ray was done that was negative. He has been using wound cleanser and a Band-Aid. Past medical history is actually quite extensive and includes chronic kidney disease stage IV, chronic diastolic heart failure, atrial fibrillation, poorly controlled hypertension, poorly controlled diabetes with peripheral neuropathy and a recent hemoglobin A1c of 11.5, depression, lymphedema and sleep apnea ABI in our clinic was 1.1 in the right 5/6; this wound was initially traumatized while the patient was going down on an escalator. Arrived in clinic last week with a completely nonviable  surface. He required mechanical debridement and we have been using Iodoflex under compression. Apparently the compression wraps fell down. He also has poorly controlled diabetes 5/15; traumatic/contusion wound to the right lateral lower leg. We have been using Iodoflex to clean at the surface of this which was completely nonviable along with mechanical debridements. He is under compression. 09/02/2019 upon evaluation today patient appears to be doing well with regard to his wound on his right lateral leg. He has been tolerating the dressing changes without complication. Fortunately there is no signs of active infection at this time. No fevers, chills, nausea, vomiting, or diarrhea. With that being said there is some necrotic tissue around the edges of the wound that is and require some sharp debridement today. That was discussed with the patient and his mother in the office today prior to debridement to which she did consent. 09/09/2019 upon evaluation today patient appears to be making progress in regard to his wound. Fortunately the wound is  not nearly as deep as what it was. It is measuring a little bit larger due to having to clear some of the edges away but again that is necessary to get this to heal. Fortunately there is no evidence of active infection at this time. No fevers, chills, nausea, vomiting, or diarrhea. 09/16/2019 on evaluation today patient appears to be doing excellent in regard to his wound currently. In fact I do not see anything that we can need to debride today which is great news. The wound bed looks healthy and the edges of the wound look like they are doing great. Overall I feel like we are at a good place and headed in the proper direction. 09/23/2019 upon evaluation today patient appears to be doing okay in regard to his wound. This is not measuring any smaller it is about the same. With that being said he tells me that his wrap actually slid down he ended up having to take  this off he was on a trip and did not have his Velcro compression with him. With that being said I do not see any signs of anything significantly worsening which is good news. 09/30/2019 upon evaluation today patient appears to be doing a little bit better in regard to his leg ulcer. He has been tolerating the dressing changes without complication. Fortunately there is no signs of active infection at this time. No fevers, chills, nausea, vomiting, or diarrhea. 10/14/2019 upon evaluation today patient's wound appears to be healthier. Overall but unfortunately is measuring a little bit bigger. With that being said I do think that we do need to still continue to wrap his leg. Fortunately there is no signs of active infection at this time which is great news. 11/18/2019 upon evaluation today patient actually appears to be doing decently well with regard to his wound. He has been tolerating the dressing changes without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

## 2022-11-01 NOTE — Progress Notes (Signed)
[name], [name] ([id number]) [id number] Page 1 of 9 Visit Report for 11/01/2022 Arrival Information Details Patient Name: Date of Service: [name], [name] 11/01/2022 2:30 PM Medical Record Number: [id number] Patient Account Number: [id number] Date of Birth/Sex: Treating RN: 31-Mar-1966 (57 y.o. M) Primary Care [name]: PA [name], NO Other Clinician: Referring [name]: Treating [name]/Extender: [name], [name] in Treatment: 1 Visit Information History Since Last Visit Added or deleted any medications: No Patient Arrived: Ambulatory Any new allergies or adverse reactions: No Arrival Time: 14:33 Had a fall or experienced change in No Accompanied By: mother activities of daily living that may affect Transfer Assistance: None risk of falls: Patient Identification Verified: Yes Signs or symptoms of abuse/neglect since last visito No Secondary Verification Process Completed: Yes Hospitalized since last visit: No Patient Requires Transmission-Based Precautions: No Implantable device outside of the clinic excluding No Patient Has Alerts: Yes cellular tissue based products placed in the center Patient Alerts: ABI in clinic L [name] 7/24 since last visit: DO NOT USE LEFT ARM Has Dressing in Place as Prescribed: Yes Pain Present Now: Yes Electronic Signature(s) Signed: 11/01/2022 4:36:58 PM By: Thayer Dallas Entered By: Thayer Dallas on 11/01/2022 14:39:17 -------------------------------------------------------------------------------- Clinic Level of Care Assessment Details Patient Name: Date of Service: [name], [name] 11/01/2022 2:30 PM Medical Record Number: [id number] Patient Account Number: [id number] Date of Birth/Sex: Treating RN: 1965/12/22 (57 y.o. [name] Primary Care [name]: PA TIENT, NO Other Clinician: Referring Kloie Whiting: Treating Travone Georg/Extender: Dorise Bullion [name] in Treatment: 1 Clinic Level of  Care Assessment Items TOOL 4 Quantity Score X- 1 0 Use when only an EandM is performed on FOLLOW-UP visit ASSESSMENTS - Nursing Assessment / Reassessment X- 1 10 Reassessment of Co-morbidities (includes updates in patient status) X- 1 5 Reassessment of Adherence to Treatment Plan ASSESSMENTS - Wound and Skin A ssessment / Reassessment X - Simple Wound Assessment / Reassessment - one wound 1 5 []  - 0 Complex Wound Assessment / Reassessment - multiple wounds []  - 0 Dermatologic / Skin Assessment (not related to wound area) ASSESSMENTS - Focused Assessment X- 1 5 Circumferential Edema Measurements - multi extremities []  - 0 Nutritional Assessment / Counseling / Intervention [name], [name] ([id number]) [id number] Page 2 of 9 []  - 0 Lower Extremity Assessment (monofilament, tuning fork, pulses) []  - 0 Peripheral Arterial Disease Assessment (using hand held doppler) ASSESSMENTS - Ostomy and/or Continence Assessment and Care []  - 0 Incontinence Assessment and Management []  - 0 Ostomy Care Assessment and Management (repouching, etc.) PROCESS - Coordination of Care X - Simple Patient / Family Education for ongoing care 1 15 []  - 0 Complex (extensive) Patient / Family Education for ongoing care X- 1 10 Staff obtains Chiropractor, Records, T Results / Process Orders est []  - 0 Staff telephones HHA, Nursing Homes / Clarify orders / etc []  - 0 Routine Transfer to another Facility (non-emergent condition) []  - 0 Routine Hospital Admission (non-emergent condition) []  - 0 New Admissions / Manufacturing engineer / Ordering NPWT Apligraf, etc. , []  - 0 Emergency Hospital Admission (emergent condition) []  - 0 Simple Discharge Coordination []  - 0 Complex (extensive) Discharge Coordination PROCESS - Special Needs []  - 0 Pediatric / Minor Patient Management []  - 0 Isolation Patient Management []  - 0 Hearing / Language / Visual special needs []  -  0 Assessment of Community assistance (transportation, D/C planning, etc.) []  - 0 Additional assistance / Altered mentation []  - 0 Support Surface(s) Assessment (bed, cushion, seat, etc.) INTERVENTIONS -  Wound Cleansing / Measurement X - Simple Wound Cleansing - one wound 1 5 []  - 0 Complex Wound Cleansing - multiple wounds X- 1 5 Wound Imaging (photographs - any number of wounds) []  - 0 Wound Tracing (instead of photographs) X- 1 5 Simple Wound Measurement - one wound []  - 0 Complex Wound Measurement - multiple wounds INTERVENTIONS - Wound Dressings []  - 0 Small Wound Dressing one or multiple wounds X- 1 15 Medium Wound Dressing one or multiple wounds []  - 0 Large Wound Dressing one or multiple wounds []  - 0 Application of Medications - topical []  - 0 Application of Medications - injection INTERVENTIONS - Miscellaneous []  - 0 External ear exam []  - 0 Specimen Collection (cultures, biopsies, blood, body fluids, etc.) []  - 0 Specimen(s) / Culture(s) sent or taken to Lab for analysis []  - 0 Patient Transfer (multiple staff / Nurse, adult / Similar devices) []  - 0 Simple Staple / Suture removal (25 or less) []  - 0 Complex Staple / Suture removal (26 or more) []  - 0 Hypo / Hyperglycemic Management (close monitor of Blood Glucose) [name], [name] ([id number]) [id number] Page 3 of 9 []  - 0 Ankle / Brachial Index (ABI) - do not check if billed separately X- 1 5 Vital Signs Has the patient been seen at the hospital within the last three years: Yes Total Score: 85 Level Of Care: New/Established - Level 3 Electronic Signature(s) Signed: 11/01/2022 4:00:15 PM By: [name] Pulling RN, BSN Entered By: [name] on 11/01/2022 15:23:09 -------------------------------------------------------------------------------- Encounter Discharge Information Details Patient Name: Date of Service: [name], [name] 11/01/2022 2:30 PM Medical Record Number:  [id number] Patient Account Number: [id number] Date of Birth/Sex: Treating RN: [birth year] (57 y.o. [name] Primary Care [name]: PA [name], NO Other Clinician: Referring [name]: Treating [name]/Extender: Moshe Cipro in Treatment: 1 Encounter Discharge Information Items Discharge Condition: Stable Ambulatory Status: Ambulatory Discharge Destination: Home Transportation: Private Auto Accompanied By: family Schedule Follow-up Appointment: Yes Clinical Summary of Care: Patient Declined Electronic Signature(s) Signed: 11/01/2022 4:00:15 PM By: [name] Pulling RN, BSN Entered By: [name] on 11/01/2022 15:23:58 -------------------------------------------------------------------------------- Lower Extremity Assessment Details Patient Name: Date of Service: [name], [name] 11/01/2022 2:30 PM Medical Record Number: [id number] Patient Account Number: [id number] Date of Birth/Sex: Treating RN: July 05, 1965 (57 y.o. M) Primary Care Madiline Saffran: PA [name], NO Other Clinician: Referring Zenaida Tesar: Treating Andrey Mccaskill/Extender: [name], [name] in Treatment: 1 Edema Assessment Assessed: [Left: No] [Right: No] Edema: [Left: N] [Right: o] Calf Left: Right: Point of Measurement: 36 cm From Medial Instep 40.5 cm Ankle Left: Right: Point of Measurement: 12 cm From Medial Instep 22.4 cm Vascular Assessment [name], [name] ([id number]) [Right:[id number] Page 4 of 9] Extremity colors, hair growth, and conditions: Extremity Color: [Left:Normal] Hair Growth on Extremity: [Left:No] Temperature of Extremity: [Left:Warm] Capillary Refill: [Left:< 3 seconds] Dependent Rubor: [Left:No No] Electronic Signature(s) Signed: 11/01/2022 4:36:58 PM By: Thayer Dallas Entered By: Thayer Dallas on 11/01/2022 14:42:40 -------------------------------------------------------------------------------- Multi Wound Chart Details Patient Name:  Date of Service: [name], [name] 11/01/2022 2:30 PM Medical Record Number: [id number] Patient Account Number: [id number] Date of Birth/Sex: Treating RN: 1965/12/03 (57 y.o. M) Primary Care Sabra Sessler: PA [name], NO Other Clinician: Referring Rumeal Cullipher: Treating Jheri Mitter/Extender: [name], [name] in Treatment: 1 Vital Signs Height(in): Pulse(bpm): 80 Weight(lbs): Blood Pressure(mmHg): 170/106 Body Mass Index(BMI): Temperature(F): 97.8 Respiratory Rate(breaths/min): 18 [4:Photos:] [N/A:N/A] Left, Anterior Lower Leg N/A N/A Wound Location: Gradually Appeared N/A N/A Wounding Event: Diabetic Wound/Ulcer of  the Lower N/A N/A Primary Etiology: Extremity Venous Leg Ulcer N/A N/A Secondary Etiology: Lymphedema, Sleep Apnea, N/A N/A Comorbid History: Arrhythmia, Congestive Heart Failure, Hypertension, Peripheral Arterial Disease, Type II Diabetes, End Stage Renal Disease 08/10/2022 N/A N/A Date Acquired: 1 N/A N/A [name] of Treatment: Open N/A N/A Wound Status: No N/A N/A Wound Recurrence: 4.2x2.2x0.1 N/A N/A Measurements L x W x D (cm) 7.257 N/A N/A A (cm) : rea 0.726 N/A N/A Volume (cm) : 17.90% N/A N/A % Reduction in A rea: 17.90% N/A N/A % Reduction in Volume: Unable to visualize wound bed N/A N/A Classification: Medium N/A N/A Exudate A [name]: Serosanguineous N/A N/A Exudate Type: red, brown N/A N/A Exudate Color: Distinct, outline attached N/A N/A Wound Margin: None Present (0%) N/A N/A Granulation A [name]: Large (67-100%) N/A N/A Necrotic A [name]: Eschar, Adherent Slough N/A N/A Necrotic Tissue: Fascia: No N/A N/A Exposed Structures: Fat Layer (Subcutaneous Tissue): No Tendon: No Muscle: No [name], [name] ([id number]) [id number] Page 5 of 9 Joint: No Bone: No None N/A N/A Epithelialization: Excoriation: No N/A N/A Periwound Skin Texture: Induration: No Callus: No Crepitus: No Rash: No Scarring:  No Maceration: No N/A N/A Periwound Skin Moisture: Dry/Scaly: No Atrophie Blanche: No N/A N/A Periwound Skin Color: Cyanosis: No Ecchymosis: No Erythema: No Hemosiderin Staining: No Mottled: No Pallor: No Rubor: No Treatment Notes Wound #4 (Lower Leg) Wound Laterality: Left, Anterior Cleanser Wound Cleanser Discharge Instruction: Cleanse the wound with wound cleanser prior to applying a clean dressing using gauze sponges, not tissue or cotton balls. Byram Ancillary Kit - 15 Day Supply Discharge Instruction: Use supplies as instructed; Kit contains: (15) Saline Bullets; (15) 3x3 Gauze; 15 pr Gloves Peri-Wound Care Skin Prep Discharge Instruction: Use skin prep as directed Topical Primary Dressing Iodosorb Gel 10 (gm) Tube Discharge Instruction: Apply to wound bed. Use iodoflex in clinic. Secondary Dressing Zetuvit Plus Silicone Border Dressing 4x4 (in/in) Discharge Instruction: Apply silicone border over primary dressing as directed. Secured With Compression Wrap Compression Stockings Facilities manager) Signed: 11/01/2022 3:41:47 PM By: Geralyn Corwin DO Entered By: Geralyn Corwin on 11/01/2022 15:28:19 -------------------------------------------------------------------------------- Multi-Disciplinary Care Plan Details Patient Name: Date of Service: [name], [name] 11/01/2022 2:30 PM Medical Record Number: [id number] Patient Account Number: [id number] Date of Birth/Sex: Treating RN: 1965-04-14 (57 y.o. [name] Primary Care Demica Zook: PA [name], [place] Other Clinician: Referring Darin Arndt: Treating Fryda Molenda/Extender: Dorise Bullion [name] in Treatment: 1 Active Inactive [name], [name] ([id number]) [id number] Page 6 of 9 Necrotic Tissue Nursing Diagnoses: Impaired tissue integrity related to necrotic/devitalized tissue Goals: Necrotic/devitalized tissue will be minimized in the wound bed Date Initiated:  10/25/2022 Target Resolution Date: 11/16/2022 Goal Status: Active Interventions: Assess patient pain level pre-, during and post procedure and prior to discharge Treatment Activities: Enzymatic debridement : 10/25/2022 T ordered outside of clinic : 10/25/2022 est Notes: Orientation to the Wound Care Program Nursing Diagnoses: Knowledge deficit related to the wound healing center program Goals: Patient/caregiver will verbalize understanding of the Wound Healing Center Program Date Initiated: 10/25/2022 Target Resolution Date: 11/16/2022 Goal Status: Active Interventions: Provide education on orientation to the wound center Notes: Wound/Skin Impairment Nursing Diagnoses: Knowledge deficit related to ulceration/compromised skin integrity Goals: Patient/caregiver will verbalize understanding of skin care regimen Date Initiated: 10/25/2022 Target Resolution Date: 11/16/2022 Goal Status: Active Ulcer/skin breakdown will heal within 14 [name] Date Initiated: 10/25/2022 Target Resolution Date: 11/16/2022 Goal Status: Active Interventions: Assess patient/caregiver ability to perform ulcer/skin care regimen upon admission and as needed Assess ulceration(s) every  visit Provide education on ulcer and skin care Treatment Activities: Skin care regimen initiated : 10/25/2022 Topical wound management initiated : 10/25/2022 Notes: Electronic Signature(s) Signed: 11/01/2022 4:00:15 PM By: [name] Pulling RN, BSN Entered By: [name] on 11/01/2022 14:50:54 -------------------------------------------------------------------------------- Pain Assessment Details Patient Name: Date of Service: [name], [name] 11/01/2022 2:30 PM Medical Record Number: [id number] Patient Account Number: [id number] Date of Birth/Sex: Treating RN: 12/04/1965 (57 y.o. M) Primary Care Haeven Nickle: PA [name], NO Other Clinician: Referring Dorea Duff: Treating Nolia Tschantz/Extender: Dorise Bullion [name], Brodrick  ([id number]) [id number] Page 7 of 9 [name] in Treatment: 1 Active Problems Location of Pain Severity and Description of Pain Patient Has Paino Yes Site Locations Pain Location: Pain in Ulcers Rate the pain. Current Pain Level: 7 Pain Management and Medication Current Pain Management: Electronic Signature(s) Signed: 11/01/2022 4:36:58 PM By: Thayer Dallas Entered By: Thayer Dallas on 11/01/2022 14:36:06 -------------------------------------------------------------------------------- Patient/Caregiver Education Details Patient Name: Date of Service: [name], [name] 7/25/2024andnbsp2:30 PM Medical Record Number: [id number] Patient Account Number: [id number] Date of Birth/Gender: Treating RN: 05/13/1965 (57 y.o. [name] Primary Care Physician: PA [name], [place] Other Clinician: Referring Physician: Treating Physician/Extender: Moshe Cipro in Treatment: 1 Education Assessment Education Provided To: Patient Education Topics Provided Wound/Skin Impairment: Methods: Explain/Verbal Responses: State content correctly Electronic Signature(s) Signed: 11/01/2022 4:00:15 PM By: [name] Pulling RN, BSN Entered By: [name] on 11/01/2022 14:51:29 [name], [name] Bash ([id number]) [id number] Page 8 of 9 -------------------------------------------------------------------------------- Wound Assessment Details Patient Name: Date of Service: [name], [name] 11/01/2022 2:30 PM Medical Record Number: [id number] Patient Account Number: [id number] Date of Birth/Sex: Treating RN: [birth year] (57 y.o. M) Primary Care Sarah Baez: PA TIENT, NO Other Clinician: Referring Annalisia Ingber: Treating Lillieann Pavlich/Extender: [name], [name] in Treatment: 1 Wound Status Wound Number: 4 Primary Diabetic Wound/Ulcer of the Lower Extremity Etiology: Wound Location: Left, Anterior Lower Leg Secondary Venous Leg Ulcer Wounding  Event: Gradually Appeared Etiology: Date Acquired: 08/10/2022 Wound Open [name] Of Treatment: 1 Status: Clustered Wound: No Comorbid Lymphedema, Sleep Apnea, Arrhythmia, Congestive Heart Failure, History: Hypertension, Peripheral Arterial Disease, Type II Diabetes, End Stage Renal Disease Photos Wound Measurements Length: (cm) 4.2 Width: (cm) 2.2 Depth: (cm) 0.1 Area: (cm) 7.257 Volume: (cm) 0.726 % Reduction in Area: 17.9% % Reduction in Volume: 17.9% Epithelialization: None Tunneling: No Undermining: No Wound Description Classification: Unable to visualize wound bed Wound Margin: Distinct, outline attached Exudate Amount: Medium Exudate Type: Serosanguineous Exudate Color: red, brown Foul Odor After Cleansing: No Slough/Fibrino No Wound Bed Granulation Amount: None Present (0%) Exposed Structure Necrotic Amount: Large (67-100%) Fascia Exposed: No Necrotic Quality: Eschar, Adherent Slough Fat Layer (Subcutaneous Tissue) Exposed: No Tendon Exposed: No Muscle Exposed: No Joint Exposed: No Bone Exposed: No Periwound Skin Texture Texture Color No Abnormalities Noted: No No Abnormalities Noted: No Callus: No Atrophie Blanche: No Crepitus: No Cyanosis: No Excoriation: No Ecchymosis: No Induration: No Erythema: No Rash: No Hemosiderin Staining: No Scarring: No Mottled: No Pallor: No Moisture Rubor: No No Abnormalities Noted: No Dry / Scaly: No [name], [name] ([id number]) [id number] Page 9 of 9 Maceration: No Treatment Notes Wound #4 (Lower Leg) Wound Laterality: Left, Anterior Cleanser Wound Cleanser Discharge Instruction: Cleanse the wound with wound cleanser prior to applying a clean dressing using gauze sponges, not tissue or cotton balls. Byram Ancillary Kit - 15 Day Supply Discharge Instruction: Use supplies as instructed; Kit contains: (15) Saline Bullets; (15) 3x3 Gauze; 15 pr Gloves Peri-Wound Care Skin Prep Discharge  Instruction: Use skin prep  as directed Topical Primary Dressing Iodosorb Gel 10 (gm) Tube Discharge Instruction: Apply to wound bed. Use iodoflex in clinic. Secondary Dressing Zetuvit Plus Silicone Border Dressing 4x4 (in/in) Discharge Instruction: Apply silicone border over primary dressing as directed. Secured With Compression Wrap Compression Stockings Facilities manager) Signed: 11/01/2022 4:36:58 PM By: Thayer Dallas Entered By: Thayer Dallas on 11/01/2022 14:47:40 -------------------------------------------------------------------------------- Vitals Details Patient Name: Date of Service: [name], [name] 11/01/2022 2:30 PM Medical Record Number: [id number] Patient Account Number: [id number] Date of Birth/Sex: Treating RN: 10/21/1965 (57 y.o. M) Primary Care Armiyah Capron: PA TIENT, NO Other Clinician: Referring Eimy Plaza: Treating Lumina Gitto/Extender: [name], [name] in Treatment: 1 Vital Signs Time Taken: 14:36 Temperature (F): 97.8 Pulse (bpm): 80 Respiratory Rate (breaths/min): 18 Blood Pressure (mmHg): 170/106 Reference Range: 80 - 120 mg / dl Electronic Signature(s) Signed: 11/01/2022 4:36:58 PM By: Thayer Dallas Entered By: Thayer Dallas on 11/01/2022 14:38:43

## 2022-11-07 ENCOUNTER — Other Ambulatory Visit (HOSPITAL_COMMUNITY): Payer: Self-pay | Admitting: Internal Medicine

## 2022-11-07 DIAGNOSIS — E11622 Type 2 diabetes mellitus with other skin ulcer: Secondary | ICD-10-CM

## 2022-11-07 DIAGNOSIS — L97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verity: Secondary | ICD-10-CM

## 2022-11-08 ENCOUNTER — Ambulatory Visit (HOSPITAL_COMMUNITY)
Admission: RE | Admit: 2022-11-08 | Discharge: 2022-11-08 | Disposition: A | Discharge status: Home / Self Care | Payer: Medicare Other | Source: Ambulatory Visit | Attending: Vascular Surgery | Admitting: Vascular Surgery

## 2022-11-08 DIAGNOSIS — L97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verity: Secondary | ICD-10-CM

## 2022-11-08 DIAGNOSIS — E11622 Type 2 diabetes mellitus with other skin ulcer: Secondary | ICD-10-CM | POA: Insufficient documentation

## 2022-11-08 DIAGNOSIS — L98499 Non-pressure chronic ulcer of skin of other sites with unspecified severity: Secondary | ICD-10-CM | POA: Insufficient documentation

## 2022-11-08 LAB — VAS US ABI WITH/WO TBI: Left ABI: 1.28

## 2022-11-13 ENCOUNTER — Encounter (HOSPITAL_BASED_OUTPATIENT_CLINIC_OR_DEPARTMENT_OTHER): Payer: Medicare Other | Admitting: Internal Medicine

## 2022-11-13 DIAGNOSIS — E1122 Type 2 diabetes mellitus with diabetic chronic kidney disease: Secondary | ICD-10-CM | POA: Insufficient documentation

## 2022-11-13 DIAGNOSIS — R0902 Hypoxemia: Secondary | ICD-10-CM | POA: Insufficient documentation

## 2022-11-13 DIAGNOSIS — I872 Venous insufficiency (chronic) (peripheral): Secondary | ICD-10-CM | POA: Insufficient documentation

## 2022-11-13 DIAGNOSIS — I87312 Chronic venous hypertension (idiopathic) with ulcer of left lower extremity: Secondary | ICD-10-CM | POA: Insufficient documentation

## 2022-11-13 DIAGNOSIS — I4891 Unspecified atrial fibrillation: Secondary | ICD-10-CM | POA: Insufficient documentation

## 2022-11-13 DIAGNOSIS — I5032 Chronic diastolic (congestive) heart failure: Secondary | ICD-10-CM | POA: Insufficient documentation

## 2022-11-13 DIAGNOSIS — L97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verity: Secondary | ICD-10-CM | POA: Insufficient documentation

## 2022-11-13 DIAGNOSIS — I890 Lymphedema, not elsewhere classified: Secondary | ICD-10-CM | POA: Insufficient documentation

## 2022-11-13 DIAGNOSIS — G4730 Sleep apnea, unspecified: Secondary | ICD-10-CM | POA: Insufficient documentation

## 2022-11-13 DIAGNOSIS — I132 Hypertensive heart and chronic kidney disease with heart failure and with stage 5 chronic kidney disease, or end stage renal disease: Secondary | ICD-10-CM | POA: Insufficient documentation

## 2022-11-13 DIAGNOSIS — Z992 Dependence on renal dialysis: Secondary | ICD-10-CM | POA: Insufficient documentation

## 2022-11-13 DIAGNOSIS — E1142 Type 2 diabetes mellitus with diabetic polyneuropathy: Secondary | ICD-10-CM | POA: Insufficient documentation

## 2022-11-13 DIAGNOSIS — E1151 Type 2 diabetes mellitus with diabetic peripheral angiopathy without gangrene: Secondary | ICD-10-CM | POA: Insufficient documentation

## 2022-11-13 DIAGNOSIS — E11622 Type 2 diabetes mellitus with other skin ulcer: Secondary | ICD-10-CM | POA: Insufficient documentation

## 2022-11-13 DIAGNOSIS — N186 End stage renal disease: Secondary | ICD-10-CM | POA: Insufficient documentation

## 2022-11-25 ENCOUNTER — Emergency Department (HOSPITAL_COMMUNITY): Payer: Medicare Other

## 2022-11-25 ENCOUNTER — Other Ambulatory Visit: Payer: Self-pay

## 2022-11-25 ENCOUNTER — Emergency Department (HOSPITAL_COMMUNITY)
Admission: EM | Admit: 2022-11-25 | Discharge: 2022-11-26 | Disposition: A | Discharge status: Home / Self Care | Payer: Medicare Other | Attending: Emergency Medicine | Admitting: Emergency Medicine

## 2022-11-25 DIAGNOSIS — I509 Heart failure, unspecified: Secondary | ICD-10-CM | POA: Diagnosis not present

## 2022-11-25 DIAGNOSIS — X58XXXS Exposure to other specified factors, sequela: Secondary | ICD-10-CM | POA: Insufficient documentation

## 2022-11-25 DIAGNOSIS — N186 End stage renal disease: Secondary | ICD-10-CM | POA: Diagnosis not present

## 2022-11-25 DIAGNOSIS — E119 Type 2 diabetes mellitus without complications: Secondary | ICD-10-CM | POA: Diagnosis not present

## 2022-11-25 DIAGNOSIS — Z992 Dependence on renal dialysis: Secondary | ICD-10-CM | POA: Diagnosis not present

## 2022-11-25 DIAGNOSIS — M79605 Pain in left leg: Secondary | ICD-10-CM | POA: Diagnosis not present

## 2022-11-25 DIAGNOSIS — S81802S Unspecified open wound, left lower leg, sequela: Secondary | ICD-10-CM | POA: Diagnosis not present

## 2022-11-25 DIAGNOSIS — J45909 Unspecified asthma, uncomplicated: Secondary | ICD-10-CM | POA: Diagnosis not present

## 2022-11-25 DIAGNOSIS — S8992XS Unspecified injury of left lower leg, sequela: Secondary | ICD-10-CM | POA: Diagnosis present

## 2022-11-25 DIAGNOSIS — I132 Hypertensive heart and chronic kidney disease with heart failure and with stage 5 chronic kidney disease, or end stage renal disease: Secondary | ICD-10-CM | POA: Insufficient documentation

## 2022-11-25 DIAGNOSIS — R21 Rash and other nonspecific skin eruption: Secondary | ICD-10-CM | POA: Diagnosis not present

## 2022-11-25 LAB — CBC
HCT: 33.9 % — ABNORMAL LOW (ref 39.0–52.0)
Hemoglobin: 10.7 g/dL — ABNORMAL LOW (ref 13.0–17.0)
MCH: 30.3 pg (ref 26.0–34.0)
MCHC: 31.6 g/dL (ref 30.0–36.0)
MCV: 96 fL (ref 80.0–100.0)
Platelets: 429 10*3/uL — ABNORMAL HIGH (ref 150–400)
RBC: 3.53 MIL/uL — ABNORMAL LOW (ref 4.22–5.81)
RDW: 13.4 % (ref 11.5–15.5)
WBC: 8.5 10*3/uL (ref 4.0–10.5)
nRBC: 0 % (ref 0.0–0.2)

## 2022-11-25 LAB — BASIC METABOLIC PANEL
Anion gap: 17 — ABNORMAL HIGH (ref 5–15)
BUN: 34 mg/dL — ABNORMAL HIGH (ref 6–20)
CO2: 27 mmol/L (ref 22–32)
Calcium: 10 mg/dL (ref 8.9–10.3)
Chloride: 92 mmol/L — ABNORMAL LOW (ref 98–111)
Creatinine, Ser: 9.12 mg/dL — ABNORMAL HIGH (ref 0.61–1.24)
GFR, Estimated: 6 mL/min — ABNORMAL LOW (ref >60)
Glucose, Bld: 180 mg/dL — ABNORMAL HIGH (ref 70–99)
Potassium: 3.8 mmol/L (ref 3.5–5.1)
Sodium: 136 mmol/L (ref 135–145)

## 2022-11-25 LAB — CBG MONITORING, ED: Glucose-Capillary: 182 mg/dL — ABNORMAL HIGH (ref 70–99)

## 2022-11-25 LAB — I-STAT CG4 LACTIC ACID, ED: Lactic Acid, Venous: 1.7 mmol/L (ref 0.5–1.9)

## 2022-11-25 MED ORDER — CARVEDILOL 12.5 MG PO TABS
25.0000 mg | ORAL_TABLET | Freq: Once | ORAL | Status: AC
  Administered 2022-11-25: 25 mg via ORAL
  Filled 2022-11-25: qty 2

## 2022-11-25 MED ORDER — AMLODIPINE BESYLATE 5 MG PO TABS
10.0000 mg | ORAL_TABLET | Freq: Once | ORAL | Status: AC
  Administered 2022-11-25: 10 mg via ORAL
  Filled 2022-11-25: qty 2

## 2022-11-25 MED ORDER — LIDOCAINE HCL URETHRAL/MUCOSAL 2 % EX GEL
1.0000 | Freq: Once | CUTANEOUS | Status: AC
  Administered 2022-11-25: 1 via TOPICAL
  Filled 2022-11-25: qty 11

## 2022-11-25 MED ORDER — ACETAMINOPHEN 500 MG PO TABS: 1000.0000 mg | ORAL_TABLET | Freq: Four times a day (QID) | ORAL | Status: DC | PRN

## 2022-11-25 MED ORDER — OXYCODONE HCL 5 MG PO TABS: 5.0000 mg | ORAL_TABLET | ORAL | 0 refills | Status: DC | PRN

## 2022-11-25 MED ORDER — OXYCODONE HCL 5 MG PO TABS
5.0000 mg | ORAL_TABLET | Freq: Once | ORAL | Status: AC
  Administered 2022-11-25: 5 mg via ORAL
  Filled 2022-11-25: qty 1

## 2022-11-25 NOTE — ED Provider Notes (Signed)
[place] EMERGENCY DEPARTMENT AT [facility] Provider Note  MDM   HPI/ROS:  [name] is a 57 y.o. male with ESRD presenting with chief complaint of sore on his left leg.  Patient endorses a sore on his left shin and that his been progressively enlarging over the past 2 months.  He was in dialysis yesterday but had to terminate his session early because his leg started to hurt him while he was undergoing dialysis.  He was instructed by his dialysis nurse to report to the emergency department to get checked out.  Denies fevers but does endorse "hot and cold flashes."  Denies chest pain, shortness of breath, palpitations.  Physical exam is notable for: - Overall well-appearing, no acute distress - Approximately 8 x 4 cm ulcer on the left shin.  No erythematous borders, purulent drainage, warmth.  On my initial evaluation, patient is:  -Vital signs stable. Patient afebrile, hemodynamically stable, and non-toxic appearing. -Additional history obtained from chart review  Given the patient's history and physical exam, differential diagnosis includes but is not limited to soft tissue ulceration, cellulitis, osteomyelitis, etc.  Interpretations, interventions, and the patient's course of care are documented below.    Workup obtained in triage includes basic labs and lactic acid, suggestive of ESRD but no acute findings.  No leukocytosis.  Lactic acid within normal limits.  X-ray ordered to evaluate for osteomyelitis.  Resulted within normal limits.  Topical lidocaine administered for pain control.  Upon reassessment, patient resting much more comfortably.  Given the patient is afebrile, vital signs are stable, workup is unremarkable, and pain is now well-controlled, deemed safe and stable for discharge.  Called in prescriptions for pain medicine.  Instructed to follow-up with wound care and primary care provider.  No additional concerns voiced at this time.  Disposition:  I  discussed the plan for discharge with the patient and/or their surrogate at bedside prior to discharge and they were in agreement with the plan and verbalized understanding of the return precautions provided. All questions answered to the best of my ability. Ultimately, the patient was discharged in stable condition with stable vital signs. I am reassured that they are capable of close follow up and good social support at home.   Clinical Impression:  1. Left leg pain   2. Wound of left leg, sequela     Rx / DC Orders ED Discharge Orders          Ordered    lidocaine (XYLOCAINE) 2 % jelly  Daily,   Status:  Discontinued        11/26/22 0002    doxycycline (VIBRAMYCIN) 100 MG capsule  2 times daily        11/26/22 0004    lidocaine (XYLOCAINE) 2 % jelly  Daily        11/26/22 0004    oxyCODONE (ROXICODONE) 5 MG immediate release tablet  Every 4 hours PRN        11/25/22 2358            The plan for this patient was discussed with Dr. Dalene Seltzer, who voiced agreement and who oversaw evaluation and treatment of this patient.   Clinical Complexity A medically appropriate history, review of systems, and physical exam was performed.  My independent interpretations of EKG, labs, and radiology are documented in the ED course above.   Click here for ABCD2, HEART and other calculatorsREFRESH Note before signing   Patient's presentation is most consistent with acute complicated illness /  injury requiring diagnostic workup.  Medical Decision Making Amount and/or Complexity of Data Reviewed Labs: ordered. Radiology: ordered.    HPI/ROS      See MDM section for pertinent HPI and ROS. A complete ROS was performed with pertinent positives/negatives noted above.   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   DM (diabetes mellitus) type II controlled, neurological manifestation (HCC) 12/28/2014   Encephalopathy, hypertensive 12/28/2014   GERD (gastroesophageal reflux disease)    Hypertension    Hypertensive emergency without congestive heart failure 12/27/2014   Pneumonia    Possible Panic disorder 12/28/2014   Renal disorder    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   Sleep apnea     Past Surgical History:  Procedure Laterality Date   AV FISTULA PLACEMENT Left 01/05/2020   Procedure: LEFT ARM BRACHIOCEPHALIC ARTERIOVENOUS (AV) FISTULA;  Surgeon: Chuck Hint, MD;  Location: [facility] OR;  Service: Vascular;  Laterality: Left;   EMPYEMA DRAINAGE Right 05/17/2017   Procedure: EMPYEMA DRAINAGE AND DECORTICATION;  Surgeon: Delight Ovens, MD;  Location: [facility] OR;  Service: Thoracic;  Laterality: Right;   FLEXIBLE BRONCHOSCOPY  05/17/2017   Procedure: FLEXIBLE BRONCHOSCOPY;  Surgeon: Delight Ovens, MD;  Location: [facility] OR;  Service: Thoracic;;   INSERTION OF DIALYSIS CATHETER Right 12/12/2021   Procedure: INSERTION OF RIGHT INTERNAL JUGULAR DIALYSIS CATHETER;  Surgeon: Chuck Hint, MD;  Location: [facility] OR;  Service: Vascular;  Laterality: Right;   IR FLUORO GUIDE CV LINE RIGHT  01/01/2020   IR THORACENTESIS ASP PLEURAL SPACE W/IMG GUIDE  05/17/2017   IR US GUIDE VASC ACCESS RIGHT  01/01/2020   NO PAST SURGERIES     REVISON OF ARTERIOVENOUS FISTULA Left 12/12/2021   Procedure: REVISION OF LEFT ARM FISTULA BY PLICATION;  Surgeon: Chuck Hint, MD;  Location: [facility] OR;  Service: Vascular;  Laterality: Left;   THORACOTOMY/LOBECTOMY Right 05/17/2017   Procedure: RIGHT MINI THORACOTOMY;  Surgeon: Delight Ovens, MD;  Location: [facility] OR;  Service: Thoracic;  Laterality: Right;   VIDEO ASSISTED THORACOSCOPY (VATS)/EMPYEMA Right  05/17/2017   Procedure: RIGHT VIDEO ASSISTED THORACOSCOPY (VATS)/EMPYEMA;  Surgeon: Delight Ovens, MD;  Location: [facility] OR;  Service: Thoracic;  Laterality: Right;      Physical Exam   Vitals:   11/25/22 2135 11/25/22 2145 11/25/22 2215 11/25/22 2345  BP: (!) 209/117 (!) 207/115 (!) 197/108 (!) 198/109  Pulse: 87 69 86 81  Resp: (!) 31 20 20 10   Temp:      TempSrc:      SpO2: 100% 95% 96% 95%  Weight:      Height:        Physical Exam Vitals and nursing note reviewed.  Constitutional:      General: He is not in acute distress.    Appearance: He is well-developed.  HENT:     Head: Normocephalic and atraumatic.  Eyes:     Conjunctiva/sclera: Conjunctivae normal.  Cardiovascular:     Rate and Rhythm: Normal rate and regular rhythm.     Heart sounds: No murmur heard. Pulmonary:     Effort: Pulmonary effort is normal. No respiratory distress.  Breath sounds: Normal breath sounds. No decreased breath sounds.  Abdominal:     Palpations: Abdomen is soft.     Tenderness: There is no abdominal tenderness.  Musculoskeletal:        General: No swelling.     Cervical back: Neck supple.     Right lower leg: No edema.     Left lower leg: No edema.  Skin:    General: Skin is warm and dry.     Capillary Refill: Capillary refill takes less than 2 seconds.     Findings: Rash present.  Neurological:     Mental Status: He is alert.  Psychiatric:        Mood and Affect: Mood normal.     Starleen Arms, MD Department of Emergency Medicine   Please note that this documentation was produced with the assistance of voice-to-text technology and may contain errors.    Dyanne Iha, MD 11/26/22 Reola Mosher    Alvira Monday, MD 11/27/22 [phone]

## 2022-11-25 NOTE — ED Triage Notes (Signed)
Pt. Stated, I have a sore on my lower left lower Leg that is getting bigger and bigger, they told me to come here. Today Ive been SOB  today.  Had dialysis Friday and Saturday 1/2 time due to the pain in his left leg

## 2022-11-25 NOTE — ED Provider Notes (Incomplete)
[place] EMERGENCY DEPARTMENT AT [facility] Provider Note  MDM   HPI/ROS:  [name] is a 57 y.o. male with ESRD presenting with chief complaint of sore on his left leg.  Patient endorses a sore on his left shin and that his been progressively enlarging over the past 2 months.  He was in dialysis yesterday but had to terminate his session early because his leg started to hurt him while he was undergoing dialysis.  He was instructed by his dialysis nurse to report to the emergency department to get checked out.  Denies fevers but does endorse "hot and cold flashes."  Denies chest pain, shortness of breath, palpitations.  Physical exam is notable for: - Overall well-appearing, no acute distress - Approximately 8 x 4 cm ulcer on the left shin.  No erythematous borders, purulent drainage, warmth.  On my initial evaluation, patient is:  -Vital signs stable. Patient afebrile, hemodynamically stable, and non-toxic appearing. -Additional history obtained from chart review  Given the patient's history and physical exam, differential diagnosis includes but is not limited to soft tissue ulceration, cellulitis, osteomyelitis, etc.  Interpretations, interventions, and the patient's course of care are documented below.    Workup obtained in triage includes basic labs and lactic acid, suggestive of ESRD but no acute findings.  No leukocytosis.  Lactic acid within normal limits.  Disposition:  I discussed the plan for discharge with the patient and/or their surrogate at bedside prior to discharge and they were in agreement with the plan and verbalized understanding of the return precautions provided. All questions answered to the best of my ability. Ultimately, the patient was discharged in stable condition with stable vital signs. I am reassured that they are capable of close follow up and good social support at home.   Clinical Impression: No diagnosis found.  Rx / DC Orders ED  Discharge Orders     None       The plan for this patient was discussed with Dr. Dalene Seltzer, who voiced agreement and who oversaw evaluation and treatment of this patient.   Clinical Complexity A medically appropriate history, review of systems, and physical exam was performed.  My independent interpretations of EKG, labs, and radiology are documented in the ED course above.   Click here for ABCD2, HEART and other calculatorsREFRESH Note before signing   Patient's presentation is most consistent with acute complicated illness / injury requiring diagnostic workup.  Medical Decision Making Amount and/or Complexity of Data Reviewed Labs: ordered. Radiology: ordered.    HPI/ROS      See MDM section for pertinent HPI and ROS. A complete ROS was performed with pertinent positives/negatives noted above.   Past Medical History:  Diagnosis Date  . Acute kidney injury (HCC) 12/28/2014  . Adjustment disorder with mixed anxiety and depressed mood 12/28/2014  . Asthma   . CHF (congestive heart failure) (HCC)   . CPAP (continuous positive airway pressure) dependence   . Diabetes mellitus without complication (HCC)   . 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 DM (diabetes mellitus) type II controlled, neurological manifestation (HCC) 12/28/2014  . Encephalopathy, hypertensive 12/28/2014  . GERD (gastroesophageal reflux disease)   . Hypertension   . Hypertensive emergency without congestive heart failure 12/27/2014  . Pneumonia   . Possible Panic disorder 12/28/2014  . Renal disorder   . 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 Sleep apnea     Past Surgical History:  Procedure Laterality Date  .  AV FISTULA PLACEMENT Left 01/05/2020   Procedure: LEFT ARM BRACHIOCEPHALIC ARTERIOVENOUS (AV) FISTULA;  Surgeon: Chuck Hint, MD;  Location: [facility] OR;  Service: Vascular;  Laterality: Left;  . EMPYEMA DRAINAGE Right 05/17/2017   Procedure:  EMPYEMA DRAINAGE AND DECORTICATION;  Surgeon: Delight Ovens, MD;  Location: [facility] OR;  Service: Thoracic;  Laterality: Right;  . FLEXIBLE BRONCHOSCOPY  05/17/2017   Procedure: FLEXIBLE BRONCHOSCOPY;  Surgeon: Delight Ovens, MD;  Location: [facility] OR;  Service: Thoracic;;  . INSERTION OF DIALYSIS CATHETER Right 12/12/2021   Procedure: INSERTION OF RIGHT INTERNAL JUGULAR DIALYSIS CATHETER;  Surgeon: Chuck Hint, MD;  Location: [facility] OR;  Service: Vascular;  Laterality: Right;  . IR FLUORO GUIDE CV LINE RIGHT  01/01/2020  . IR THORACENTESIS ASP PLEURAL SPACE W/IMG GUIDE  05/17/2017  . IR US GUIDE VASC ACCESS RIGHT  01/01/2020  . NO PAST SURGERIES    . REVISON OF ARTERIOVENOUS FISTULA Left 12/12/2021   Procedure: REVISION OF LEFT ARM FISTULA BY PLICATION;  Surgeon: Chuck Hint, MD;  Location: [facility] OR;  Service: Vascular;  Laterality: Left;  . THORACOTOMY/LOBECTOMY Right 05/17/2017   Procedure: RIGHT MINI THORACOTOMY;  Surgeon: Delight Ovens, MD;  Location: [facility] OR;  Service: Thoracic;  Laterality: Right;  Marland Kitchen VIDEO ASSISTED THORACOSCOPY (VATS)/EMPYEMA Right 05/17/2017   Procedure: RIGHT VIDEO ASSISTED THORACOSCOPY (VATS)/EMPYEMA;  Surgeon: Delight Ovens, MD;  Location: [facility] OR;  Service: Thoracic;  Laterality: Right;      Physical Exam   Vitals:   11/25/22 1447 11/25/22 1506  BP:  (!) 168/112  Pulse:  93  Resp:  16  Temp:  98.2 F (36.8 C)  TempSrc:  Oral  SpO2:  97%  Weight: 105.2 kg   Height: 5\' 7"  (1.702 m)     Physical Exam   Procedures    Procedures   Starleen Arms, MD Department of Emergency Medicine   Please note that this documentation was produced with the assistance of voice-to-text technology and may contain errors.

## 2022-11-25 NOTE — Discharge Instructions (Addendum)
You were seen today for a wound. While you were here we monitored your vitals, preformed a physical exam, and checked labs and an x-ray of your leg. These were all reassuring and there is no indication for any further testing or intervention in the emergency department at this time.   Things to do:  - Follow up with your primary care provider within the next 1-2 weeks -Please be sure to follow-up with wound care outpatient - Pick up your pain medicine prescription at [facility] on [address].  Please be sure to take this medication as prescribed  Return to the emergency department if you have any new or worsening symptoms including fevers, chest pain, shortness of breath, or if you have any other concerns.

## 2022-11-26 MED ORDER — LIDOCAINE HCL URETHRAL/MUCOSAL 2 % EX GEL: 1.0000 | Freq: Every day | CUTANEOUS | 0 refills | Status: DC

## 2022-11-26 MED ORDER — DOXYCYCLINE HYCLATE 100 MG PO CAPS
100.0000 mg | ORAL_CAPSULE | Freq: Two times a day (BID) | ORAL | 0 refills | Status: DC
Start: 2022-11-26 — End: 2022-12-03

## 2022-11-28 ENCOUNTER — Ambulatory Visit: Payer: Medicare Other | Admitting: Podiatry

## 2022-11-28 DIAGNOSIS — Z91199 Patient's noncompliance with other medical treatment and regimen due to unspecified reason: Secondary | ICD-10-CM

## 2022-11-29 ENCOUNTER — Encounter: Payer: Self-pay | Admitting: Nephrology

## 2022-11-29 ENCOUNTER — Ambulatory Visit: Payer: Medicare Other | Admitting: Allergy & Immunology

## 2022-11-30 ENCOUNTER — Encounter (HOSPITAL_COMMUNITY): Payer: Self-pay | Admitting: Emergency Medicine

## 2022-11-30 ENCOUNTER — Other Ambulatory Visit: Payer: Self-pay

## 2022-11-30 ENCOUNTER — Emergency Department (HOSPITAL_COMMUNITY): Payer: Medicare Other

## 2022-11-30 ENCOUNTER — Inpatient Hospital Stay (HOSPITAL_COMMUNITY)
Admission: EM | Admit: 2022-11-30 | Discharge: 2022-12-03 | DRG: 638 | Disposition: A | Discharge status: Home / Self Care | Payer: Medicare Other | Attending: Family Medicine | Admitting: Family Medicine

## 2022-11-30 DIAGNOSIS — Z794 Long term (current) use of insulin: Secondary | ICD-10-CM

## 2022-11-30 DIAGNOSIS — Z8701 Personal history of pneumonia (recurrent): Secondary | ICD-10-CM

## 2022-11-30 DIAGNOSIS — R9431 Abnormal electrocardiogram [ECG] [EKG]: Secondary | ICD-10-CM

## 2022-11-30 DIAGNOSIS — Z79899 Other long term (current) drug therapy: Secondary | ICD-10-CM

## 2022-11-30 DIAGNOSIS — E1165 Type 2 diabetes mellitus with hyperglycemia: Secondary | ICD-10-CM | POA: Diagnosis present

## 2022-11-30 DIAGNOSIS — S81802A Unspecified open wound, left lower leg, initial encounter: Secondary | ICD-10-CM | POA: Diagnosis present

## 2022-11-30 DIAGNOSIS — F4323 Adjustment disorder with mixed anxiety and depressed mood: Secondary | ICD-10-CM | POA: Diagnosis present

## 2022-11-30 DIAGNOSIS — K219 Gastro-esophageal reflux disease without esophagitis: Secondary | ICD-10-CM | POA: Diagnosis present

## 2022-11-30 DIAGNOSIS — I132 Hypertensive heart and chronic kidney disease with heart failure and with stage 5 chronic kidney disease, or end stage renal disease: Secondary | ICD-10-CM | POA: Diagnosis present

## 2022-11-30 DIAGNOSIS — L97221 Non-pressure chronic ulcer of left calf limited to breakdown of skin: Secondary | ICD-10-CM | POA: Diagnosis present

## 2022-11-30 DIAGNOSIS — E669 Obesity, unspecified: Secondary | ICD-10-CM | POA: Diagnosis present

## 2022-11-30 DIAGNOSIS — M898X9 Other specified disorders of bone, unspecified site: Secondary | ICD-10-CM | POA: Diagnosis present

## 2022-11-30 DIAGNOSIS — R112 Nausea with vomiting, unspecified: Secondary | ICD-10-CM

## 2022-11-30 DIAGNOSIS — J4540 Moderate persistent asthma, uncomplicated: Secondary | ICD-10-CM | POA: Diagnosis present

## 2022-11-30 DIAGNOSIS — L97929 Non-pressure chronic ulcer of unspecified part of left lower leg with unspecified severity: Secondary | ICD-10-CM | POA: Diagnosis not present

## 2022-11-30 DIAGNOSIS — I5032 Chronic diastolic (congestive) heart failure: Secondary | ICD-10-CM | POA: Diagnosis present

## 2022-11-30 DIAGNOSIS — Z6834 Body mass index (BMI) 34.0-34.9, adult: Secondary | ICD-10-CM

## 2022-11-30 DIAGNOSIS — Z992 Dependence on renal dialysis: Secondary | ICD-10-CM | POA: Diagnosis not present

## 2022-11-30 DIAGNOSIS — L299 Pruritus, unspecified: Secondary | ICD-10-CM | POA: Diagnosis present

## 2022-11-30 DIAGNOSIS — D631 Anemia in chronic kidney disease: Secondary | ICD-10-CM | POA: Diagnosis present

## 2022-11-30 DIAGNOSIS — R7881 Bacteremia: Secondary | ICD-10-CM | POA: Insufficient documentation

## 2022-11-30 DIAGNOSIS — I1A0 Resistant hypertension: Secondary | ICD-10-CM | POA: Diagnosis present

## 2022-11-30 DIAGNOSIS — B965 Pseudomonas (aeruginosa) (mallei) (pseudomallei) as the cause of diseases classified elsewhere: Secondary | ICD-10-CM | POA: Diagnosis not present

## 2022-11-30 DIAGNOSIS — G4733 Obstructive sleep apnea (adult) (pediatric): Secondary | ICD-10-CM | POA: Diagnosis present

## 2022-11-30 DIAGNOSIS — S81802S Unspecified open wound, left lower leg, sequela: Secondary | ICD-10-CM | POA: Diagnosis not present

## 2022-11-30 DIAGNOSIS — E1122 Type 2 diabetes mellitus with diabetic chronic kidney disease: Secondary | ICD-10-CM | POA: Diagnosis present

## 2022-11-30 DIAGNOSIS — E119 Type 2 diabetes mellitus without complications: Secondary | ICD-10-CM

## 2022-11-30 DIAGNOSIS — Z8249 Family history of ischemic heart disease and other diseases of the circulatory system: Secondary | ICD-10-CM | POA: Diagnosis not present

## 2022-11-30 DIAGNOSIS — E11622 Type 2 diabetes mellitus with other skin ulcer: Principal | ICD-10-CM | POA: Diagnosis present

## 2022-11-30 DIAGNOSIS — N186 End stage renal disease: Secondary | ICD-10-CM | POA: Diagnosis not present

## 2022-11-30 LAB — COMPREHENSIVE METABOLIC PANEL
ALT: 11 U/L (ref 0–44)
AST: 14 U/L — ABNORMAL LOW (ref 15–41)
Albumin: 2.8 g/dL — ABNORMAL LOW (ref 3.5–5.0)
Alkaline Phosphatase: 76 U/L (ref 38–126)
Anion gap: 15 (ref 5–15)
BUN: 18 mg/dL (ref 6–20)
CO2: 28 mmol/L (ref 22–32)
Calcium: 9.2 mg/dL (ref 8.9–10.3)
Chloride: 92 mmol/L — ABNORMAL LOW (ref 98–111)
Creatinine, Ser: 5.62 mg/dL — ABNORMAL HIGH (ref 0.61–1.24)
GFR, Estimated: 11 mL/min — ABNORMAL LOW (ref >60)
Glucose, Bld: 161 mg/dL — ABNORMAL HIGH (ref 70–99)
Potassium: 3.2 mmol/L — ABNORMAL LOW (ref 3.5–5.1)
Sodium: 135 mmol/L (ref 135–145)
Total Bilirubin: 0.5 mg/dL (ref 0.3–1.2)
Total Protein: 7.4 g/dL (ref 6.5–8.1)

## 2022-11-30 LAB — C-REACTIVE PROTEIN: CRP: 12.5 mg/dL — ABNORMAL HIGH (ref <1.0)

## 2022-11-30 LAB — CBC WITH DIFFERENTIAL/PLATELET
Abs Immature Granulocytes: 0.02 10*3/uL (ref 0.00–0.07)
Basophils Absolute: 0.1 10*3/uL (ref 0.0–0.1)
Basophils Relative: 1 %
Eosinophils Absolute: 0.2 10*3/uL (ref 0.0–0.5)
Eosinophils Relative: 2 %
HCT: 34.3 % — ABNORMAL LOW (ref 39.0–52.0)
Hemoglobin: 11 g/dL — ABNORMAL LOW (ref 13.0–17.0)
Immature Granulocytes: 0 %
Lymphocytes Relative: 26 %
Lymphs Abs: 1.9 10*3/uL (ref 0.7–4.0)
MCH: 31 pg (ref 26.0–34.0)
MCHC: 32.1 g/dL (ref 30.0–36.0)
MCV: 96.6 fL (ref 80.0–100.0)
Monocytes Absolute: 0.5 10*3/uL (ref 0.1–1.0)
Monocytes Relative: 7 %
Neutro Abs: 4.7 10*3/uL (ref 1.7–7.7)
Neutrophils Relative %: 64 %
Platelets: 465 10*3/uL — ABNORMAL HIGH (ref 150–400)
RBC: 3.55 MIL/uL — ABNORMAL LOW (ref 4.22–5.81)
RDW: 13.3 % (ref 11.5–15.5)
WBC: 7.4 10*3/uL (ref 4.0–10.5)
nRBC: 0 % (ref 0.0–0.2)

## 2022-11-30 LAB — TROPONIN I (HIGH SENSITIVITY)
Troponin I (High Sensitivity): 69 ng/L — ABNORMAL HIGH (ref <18)
Troponin I (High Sensitivity): 74 ng/L — ABNORMAL HIGH (ref <18)

## 2022-11-30 LAB — HEMOGLOBIN A1C
Hgb A1c MFr Bld: 7.2 % — ABNORMAL HIGH (ref 4.8–5.6)
Mean Plasma Glucose: 159.94 mg/dL

## 2022-11-30 LAB — GLUCOSE, CAPILLARY: Glucose-Capillary: 166 mg/dL — ABNORMAL HIGH (ref 70–99)

## 2022-11-30 LAB — I-STAT CG4 LACTIC ACID, ED
Lactic Acid, Venous: 1 mmol/L (ref 0.5–1.9)
Lactic Acid, Venous: 1.2 mmol/L (ref 0.5–1.9)

## 2022-11-30 LAB — SEDIMENTATION RATE: Sed Rate: 106 mm/hr — ABNORMAL HIGH (ref 0–16)

## 2022-11-30 MED ORDER — PROCHLORPERAZINE MALEATE 5 MG PO TABS: 5.0000 mg | ORAL_TABLET | Freq: Four times a day (QID) | ORAL | Status: DC | PRN

## 2022-11-30 MED ORDER — HEPARIN SODIUM (PORCINE) 5000 UNIT/ML IJ SOLN
5000.0000 [IU] | Freq: Three times a day (TID) | INTRAMUSCULAR | Status: DC
  Administered 2022-11-30 – 2022-12-03 (×8): 5000 [IU] via SUBCUTANEOUS
  Filled 2022-11-30 (×8): qty 1

## 2022-11-30 MED ORDER — GABAPENTIN 300 MG PO CAPS
300.0000 mg | ORAL_CAPSULE | Freq: Every day | ORAL | Status: DC
  Administered 2022-11-30 – 2022-12-02 (×3): 300 mg via ORAL
  Filled 2022-11-30 (×4): qty 1

## 2022-11-30 MED ORDER — CALCITRIOL 0.25 MCG PO CAPS: 0.2500 ug | ORAL_CAPSULE | ORAL | Status: DC

## 2022-11-30 MED ORDER — CETIRIZINE HCL 10 MG PO TABS
10.0000 mg | ORAL_TABLET | Freq: Every day | ORAL | Status: DC
  Administered 2022-12-01 – 2022-12-02 (×2): 10 mg via ORAL
  Filled 2022-11-30 (×2): qty 1

## 2022-11-30 MED ORDER — LEVOCETIRIZINE DIHYDROCHLORIDE 5 MG PO TABS: 5.0000 mg | ORAL_TABLET | Freq: Every evening | ORAL | Status: DC

## 2022-11-30 MED ORDER — ACETAMINOPHEN 325 MG PO TABS
650.0000 mg | ORAL_TABLET | Freq: Four times a day (QID) | ORAL | Status: DC
  Administered 2022-11-30 – 2022-12-03 (×11): 650 mg via ORAL
  Filled 2022-11-30 (×11): qty 2

## 2022-11-30 MED ORDER — OXYCODONE HCL 5 MG PO TABS
5.0000 mg | ORAL_TABLET | ORAL | Status: DC | PRN
  Administered 2022-11-30 – 2022-12-02 (×10): 5 mg via ORAL
  Filled 2022-11-30 (×11): qty 1

## 2022-11-30 MED ORDER — SEVELAMER CARBONATE 800 MG PO TABS
2400.0000 mg | ORAL_TABLET | Freq: Three times a day (TID) | ORAL | Status: DC
  Administered 2022-12-01 – 2022-12-02 (×6): 2400 mg via ORAL
  Filled 2022-11-30 (×6): qty 3

## 2022-11-30 MED ORDER — PIPERACILLIN-TAZOBACTAM IN DEX 2-0.25 GM/50ML IV SOLN
2.2500 g | Freq: Three times a day (TID) | INTRAVENOUS | Status: DC
  Administered 2022-11-30 – 2022-12-03 (×8): 2.25 g via INTRAVENOUS
  Filled 2022-11-30 (×10): qty 50

## 2022-11-30 MED ORDER — CARVEDILOL 25 MG PO TABS
25.0000 mg | ORAL_TABLET | Freq: Two times a day (BID) | ORAL | Status: DC
  Administered 2022-12-01 – 2022-12-03 (×5): 25 mg via ORAL
  Filled 2022-11-30: qty 1
  Filled 2022-11-30 (×4): qty 2

## 2022-11-30 MED ORDER — CAMPHOR-MENTHOL 0.5-0.5 % EX LOTN
TOPICAL_LOTION | Freq: Two times a day (BID) | CUTANEOUS | Status: DC
  Filled 2022-11-30: qty 222

## 2022-11-30 MED ORDER — LORAZEPAM 0.5 MG PO TABS: 0.5000 mg | ORAL_TABLET | Freq: Four times a day (QID) | ORAL | Status: DC | PRN

## 2022-11-30 MED ORDER — INSULIN ASPART 100 UNIT/ML IJ SOLN
0.0000 [IU] | Freq: Three times a day (TID) | INTRAMUSCULAR | Status: DC
  Administered 2022-12-02: 1 [IU] via SUBCUTANEOUS

## 2022-11-30 MED ORDER — AMLODIPINE BESYLATE 10 MG PO TABS
10.0000 mg | ORAL_TABLET | Freq: Every day | ORAL | Status: DC
  Administered 2022-11-30 – 2022-12-03 (×4): 10 mg via ORAL
  Filled 2022-11-30: qty 2
  Filled 2022-11-30: qty 1
  Filled 2022-11-30 (×2): qty 2

## 2022-11-30 MED ORDER — RENA-VITE PO TABS
1.0000 | ORAL_TABLET | Freq: Every day | ORAL | Status: DC
  Administered 2022-11-30 – 2022-12-02 (×3): 1 via ORAL
  Filled 2022-11-30 (×3): qty 1

## 2022-11-30 MED ORDER — ALBUTEROL SULFATE (2.5 MG/3ML) 0.083% IN NEBU: 2.5000 mg | INHALATION_SOLUTION | Freq: Four times a day (QID) | RESPIRATORY_TRACT | Status: DC | PRN

## 2022-11-30 MED ORDER — FENTANYL CITRATE PF 50 MCG/ML IJ SOSY
75.0000 ug | PREFILLED_SYRINGE | Freq: Once | INTRAMUSCULAR | Status: AC
  Administered 2022-11-30: 75 ug via INTRAVENOUS
  Filled 2022-11-30: qty 2

## 2022-11-30 MED ORDER — VITAMIN D (ERGOCALCIFEROL) 1.25 MG (50000 UNIT) PO CAPS
50000.0000 [IU] | ORAL_CAPSULE | ORAL | Status: DC
  Filled 2022-11-30: qty 1

## 2022-11-30 MED ORDER — DIPHENHYDRAMINE HCL 25 MG PO CAPS
25.0000 mg | ORAL_CAPSULE | Freq: Once | ORAL | Status: AC
  Administered 2022-11-30: 25 mg via ORAL
  Filled 2022-11-30: qty 1

## 2022-11-30 MED ORDER — OXYCODONE HCL 5 MG PO TABS
5.0000 mg | ORAL_TABLET | Freq: Once | ORAL | Status: AC
  Administered 2022-11-30: 5 mg via ORAL
  Filled 2022-11-30: qty 1

## 2022-11-30 NOTE — ED Provider Notes (Signed)
[place] EMERGENCY DEPARTMENT AT [facility] Provider Note   CSN: [id number] Arrival date & time: 11/30/22  1505     History  Chief Complaint  Patient presents with   Shortness of Breath   Leg infection    [name] is a 58 y.o. male.  Patient is a 57 year old male with past medical history of ESRD on dialysis, resistant hypertension, DMII, HFpEF, presenting today for pain to wound present on his left lower leg.  He notes that is been present for months, but worsening pain over the last few days.  He has felt feverish at home but not taken his temperature.  He admits to associated nausea and vomiting as well as decreased appetite.  He notes shortened dialysis sessions recently due to not been able to tolerate because of his pain.  He was able to undergo a full dialysis session this morning.  He denies any current chest pain or shortness of breath.  He denies any significant abdominal distention.  He was sent home after his visit on 8/18 with doxycycline and oxycodone.  His last dose of pain medication was yesterday.  Per dialysis, he was noted to have a positive blood culture for Pseudomonas last week.  He has received 2 doses of IV antibiotics for this since that culture resulted.     Home Medications Prior to Admission medications   Medication Sig Start Date End Date Taking? Authorizing Provider  albuterol (PROVENTIL) (2.5 MG/3ML) 0.083% nebulizer solution Take 3 mLs (2.5 mg total) by nebulization every 6 (six) hours as needed for wheezing or shortness of breath. 09/25/22  Yes Alfonse Spruce, MD  albuterol (VENTOLIN HFA) 108 (90 Base) MCG/ACT inhaler Inhale 2 puffs into the lungs every 6 (six) hours as needed for wheezing or shortness of breath. 09/25/22  Yes Alfonse Spruce, MD  amLODipine (NORVASC) 10 MG tablet Take 1 tablet (10 mg total) by mouth daily. 05/24/22  Yes Rocky Morel, DO  calcitRIOL (ROCALTROL) 0.25 MCG capsule Take 1 capsule (0.25 mcg total)  by mouth every Monday, Wednesday, and Friday. 01/08/20  Yes Albertine Grates, MD  carvedilol (COREG) 25 MG tablet Take 1 tablet (25 mg total) by mouth 2 (two) times daily with a meal. 05/24/22  Yes Rocky Morel, DO  doxycycline (VIBRAMYCIN) 100 MG capsule Take 1 capsule (100 mg total) by mouth 2 (two) times daily for 10 days. 11/26/22 12/06/22 Yes Alvira Monday, MD  gabapentin (NEURONTIN) 600 MG tablet Take 600 mg by mouth daily.   Yes [provider]  Insulin Lispro Prot & Lispro (HUMALOG 75/25 MIX) (75-25) 100 UNIT/ML Kwikpen Inject 5-10 Units into the skin 2 (two) times daily with a meal. Patient taking differently: Inject 15 Units into the skin 2 (two) times daily with a meal. 05/24/22  Yes Rocky Morel, DO  levocetirizine (XYZAL) 5 MG tablet Take 1 tablet (5 mg total) by mouth every evening. 09/25/22  Yes Alfonse Spruce, MD  lidocaine (XYLOCAINE) 2 % jelly Apply 1 Application topically daily. 11/26/22  Yes Alvira Monday, MD  multivitamin (RENA-VIT) TABS tablet Take 1 tablet by mouth at bedtime. 05/24/22  Yes Rocky Morel, DO  ondansetron (ZOFRAN-ODT) 4 MG disintegrating tablet Take 4 mg by mouth daily as needed for nausea or vomiting. 07/10/22  Yes [provider]  oxyCODONE (ROXICODONE) 5 MG immediate release tablet Take 1 tablet (5 mg total) by mouth every 4 (four) hours as needed for severe pain. 11/25/22  Yes Dyanne Iha, MD  sevelamer carbonate (  RENVELA) 800 MG tablet Take 2-3 tablets (1,600-2,400 mg total) by mouth 3 (three) times daily with meals. 05/24/22  Yes Rocky Morel, DO  traMADol (ULTRAM) 50 MG tablet Take 50 mg by mouth 2 (two) times daily as needed for moderate pain. 10/23/22  Yes [provider]  Vitamin D, Ergocalciferol, (DRISDOL) 1.25 MG (50000 UNIT) CAPS capsule Take 50,000 Units by mouth See admin instructions. Take one tablet by mouth on Monday, Wednesdays and Fridays per patient 08/17/19  Yes [provider]  Accu-Chek Softclix  Lancets lancets Use as directed.  For insulin-dependent type 2 diabetes Monitor  glucose 3 times a day Patient taking differently: 1 each by Other route See admin instructions. Use as directed.  For insulin-dependent type 2 diabetes Monitor  glucose 3 times a day 01/06/20   Albertine Grates, MD  gabapentin (NEURONTIN) 300 MG capsule Take 1 capsule (300 mg total) by mouth daily. Patient not taking: Reported on 11/30/2022 05/24/22   Rocky Morel, DO  glucose blood test strip Use as instructed For insulin-dependent type 2 diabetes Monitor  glucose 3 times a day Patient taking differently: 1 each by Other route See admin instructions. Use as instructed For insulin-dependent type 2 diabetes Monitor  glucose 3 times a day 01/06/20   Albertine Grates, MD  Insulin Pen Needle 32G X 4 MM MISC Use as directed up to four times daily Patient taking differently: 1 each by Other route 4 (four) times daily. 05/24/22   Rocky Morel, DO  insulin aspart protamine - aspart (NOVOLOG MIX 70/30 FLEXPEN) (70-30) 100 UNIT/ML FlexPen Inject 15-30 Units into the skin 2 (two) times daily.  05/24/22  [provider]      Allergies    Patient has no known allergies.    Review of Systems   Negative except as noted above in HPI  Physical Exam Updated Vital Signs BP (!) 187/108 (BP Location: Right Arm)   Pulse 77   Temp 98.2 F (36.8 C) (Oral)   Resp 19   SpO2 94%  Physical Exam Vitals and nursing note reviewed.  Constitutional:      General: He is not in acute distress.    Appearance: He is well-developed.  HENT:     Head: Normocephalic and atraumatic.  Eyes:     Extraocular Movements: Extraocular movements intact.     Conjunctiva/sclera: Conjunctivae normal.     Pupils: Pupils are equal, round, and reactive to light.  Cardiovascular:     Rate and Rhythm: Normal rate and regular rhythm.     Heart sounds: No murmur heard. Pulmonary:     Effort: Pulmonary effort is normal. No respiratory distress.      Breath sounds: Normal breath sounds. No wheezing or rales.     Comments: Saturating well on room air Abdominal:     General: There is no distension.     Palpations: Abdomen is soft.     Tenderness: There is no abdominal tenderness. There is no right CVA tenderness, left CVA tenderness or guarding.  Musculoskeletal:        General: No swelling or tenderness.     Cervical back: Neck supple.     Comments: 2+ pedal pulses bilaterally.  Skin:    General: Skin is warm and dry.     Capillary Refill: Capillary refill takes less than 2 seconds.     Comments: Skin ulceration approximately 8 x 4 cm noted to the anterior aspect of the left lower leg with some faint surrounding erythema and  significant tenderness as pictured in the chart.  Neurological:     Mental Status: He is alert and oriented to person, place, and time.  Psychiatric:        Mood and Affect: Mood normal.     ED Results / Procedures / Treatments   Labs (all labs ordered are listed, but only abnormal results are displayed) Labs Reviewed  COMPREHENSIVE METABOLIC PANEL - Abnormal; Notable for the following components:      Result Value   Potassium 3.2 (*)    Chloride 92 (*)    Glucose, Bld 161 (*)    Creatinine, Ser 5.62 (*)    Albumin 2.8 (*)    AST 14 (*)    GFR, Estimated 11 (*)    All other components within normal limits  CBC WITH DIFFERENTIAL/PLATELET - Abnormal; Notable for the following components:   RBC 3.55 (*)    Hemoglobin 11.0 (*)    HCT 34.3 (*)    Platelets 465 (*)    All other components within normal limits  SEDIMENTATION RATE - Abnormal; Notable for the following components:   Sed Rate 106 (*)    All other components within normal limits  C-REACTIVE PROTEIN - Abnormal; Notable for the following components:   CRP 12.5 (*)    All other components within normal limits  HEMOGLOBIN A1C - Abnormal; Notable for the following components:   Hgb A1c MFr Bld 7.2 (*)    All other components within normal  limits  GLUCOSE, CAPILLARY - Abnormal; Notable for the following components:   Glucose-Capillary 166 (*)    All other components within normal limits  TROPONIN I (HIGH SENSITIVITY) - Abnormal; Notable for the following components:   Troponin I (High Sensitivity) 69 (*)    All other components within normal limits  TROPONIN I (HIGH SENSITIVITY) - Abnormal; Notable for the following components:   Troponin I (High Sensitivity) 74 (*)    All other components within normal limits  CULTURE, BLOOD (ROUTINE X 2)  CULTURE, BLOOD (ROUTINE X 2)  RENAL FUNCTION PANEL  I-STAT CG4 LACTIC ACID, ED  I-STAT CG4 LACTIC ACID, ED    EKG None  Radiology DG Tibia/Fibula Left Port  Result Date: 11/30/2022 CLINICAL DATA:  Trauma, pain EXAM: PORTABLE LEFT TIBIA AND FIBULA - 2 VIEW COMPARISON:  11/25/2022 FINDINGS: No fracture or dislocation of the left tibia or fibula. Soft tissue edema anteriorly. Vascular calcinosis. IMPRESSION: No fracture or dislocation of the left tibia or fibula. Soft tissue edema anteriorly. Electronically Signed   By: Jearld Lesch M.D.   On: 11/30/2022 18:23   DG Chest Portable 1 View  Result Date: 11/30/2022 CLINICAL DATA:  Shortness of breath EXAM: PORTABLE CHEST 1 VIEW COMPARISON:  X-ray 11/25/2022 FINDINGS: Underinflation. Borderline cardiopericardial silhouette with some prominence of the central vasculature. No consolidation, pneumothorax or effusion. No edema. Overlapping cardiac leads. Degenerative changes seen along the spine. IMPRESSION: Enlarged cardiopericardial silhouette with some vascular congestion. Electronically Signed   By: Karen Kays M.D.   On: 11/30/2022 18:22      Medications Ordered in ED Medications  heparin injection 5,000 Units (5,000 Units Subcutaneous Given 11/30/22 2320)  oxyCODONE (Oxy IR/ROXICODONE) immediate release tablet 5 mg (5 mg Oral Given 11/30/22 2316)  acetaminophen (TYLENOL) tablet 650 mg (650 mg Oral Given 11/30/22 2016)  amLODipine  (NORVASC) tablet 10 mg (10 mg Oral Given 11/30/22 2316)  carvedilol (COREG) tablet 25 mg (has no administration in time range)  calcitRIOL (ROCALTROL) capsule 0.25 mcg (has  no administration in time range)  sevelamer carbonate (RENVELA) tablet 2,400 mg (has no administration in time range)  insulin aspart (novoLOG) injection 0-6 Units (has no administration in time range)  multivitamin (RENA-VIT) tablet 1 tablet (1 tablet Oral Given 11/30/22 2320)  Vitamin D (Ergocalciferol) (DRISDOL) 1.25 MG (50000 UNIT) capsule 50,000 Units (has no administration in time range)  gabapentin (NEURONTIN) capsule 300 mg (300 mg Oral Given 11/30/22 2317)  albuterol (PROVENTIL) (2.5 MG/3ML) 0.083% nebulizer solution 2.5 mg (has no administration in time range)  piperacillin-tazobactam (ZOSYN) IVPB 2.25 g (2.25 g Intravenous New Bag/Given 11/30/22 2023)  LORazepam (ATIVAN) tablet 0.5 mg (has no administration in time range)  cetirizine (ZYRTEC) tablet 10 mg (has no administration in time range)  camphor-menthol (SARNA) lotion (has no administration in time range)  oxyCODONE (Oxy IR/ROXICODONE) immediate release tablet 5 mg (5 mg Oral Given 11/30/22 1628)  fentaNYL (SUBLIMAZE) injection 75 mcg (75 mcg Intravenous Given 11/30/22 1758)  diphenhydrAMINE (BENADRYL) capsule 25 mg (25 mg Oral Given 11/30/22 2317)    ED Course/ Medical Decision Making/ A&P                                Medical Decision Making Problems Addressed: Nausea and vomiting, unspecified vomiting type: complicated acute illness or injury  Amount and/or Complexity of Data Reviewed Independent Historian: caregiver External Data Reviewed: labs, radiology and notes. Labs: ordered. Radiology: ordered and independent interpretation performed.  Risk Prescription drug management. Decision regarding hospitalization.    Patient is a 57 year old male with past medical history of ESRD on dialysis, resistant hypertension, DMII, HFpEF, presenting today  for pain to wound present on his left lower leg.  On exam, patient is alert and oriented.  He is saturating well on room air.  He is in regular rate and rhythm.  His left leg does show an ulceration with mild surrounding erythema as further pictured in the chart with significant tenderness.  Presentation concerning for wound infection, cellulitis, erysipelas, osteomyelitis, and bacteremia.  He does have 2+ pedal pulses bilaterally, decreased concern for vascular compromise.  He has no calf tenderness, low suspicion for DVT contributing to his current pain.  EKG reveals sinus rhythm at 82 bpm without any acute ST or T wave abnormalities.  Lab work reveals normal WBC count, mild anemia with a hemoglobin of 11, mild hypokalemia at 3.2, chronically elevated creatinine at 5.62.  CRP is increased at 12.5.  Troponin is increased at 74.  Sed rate is increased at 106.  Lactic acid is normal.  Chest x-ray shows some vascular congestion.  X-ray of the left tib-fib reveals soft tissue edema but no further abnormalities.  Given concern for bacteremia from the dialysis center, and that they have already given him 2 doses of IV antibiotics with persistent nausea, vomiting, chills, and worsening pain, believe the patient has now failed outpatient treatment.  Discussed case with family medicine team who is in agreement with plan for admission for concern of bacteremia.  He is hemodynamically stable and appropriate for transfer to the floor.   Final Clinical Impression(s) / ED Diagnoses Final diagnoses:  Wound of left lower extremity, sequela  Nausea and vomiting, unspecified vomiting type  Prolonged QT interval    Rx / DC Orders ED Discharge Orders     None         Rhys Martini, DO 12/01/22 3295    Pricilla Loveless, MD 12/01/22 1539

## 2022-11-30 NOTE — Assessment & Plan Note (Deleted)
Present for about 4 months with acute worsening pain over the past 2 days plus some chills. Has completed courses of doxycycline in the past, most recently was getting IV ceftazidine due to blood cultures that grew pseudomonas. Afebrile, no leukocytosis. -admit to FMTS, med-surg, attending Dr Linwood Dibbles -Zosyn managed by pharmacy -Tylenol q6hrs scheduled -oxycodone 5mg  q4hr PRN -consult to wound care -PT eval and treat - ID consult in AM -follow up blood cultures

## 2022-11-30 NOTE — Assessment & Plan Note (Addendum)
Has been receiving some shortened sessions s/t to leg pain. Full session Friday.  - Consult nephrology tomorrow AM  - Continue sevelamer, calcitriol, renavit, gabapentin  - Renal diet

## 2022-11-30 NOTE — H&P (Cosign Needed Addendum)
Hospital Admission History and Physical Service Pager: [phone]  Patient name: [name] Medical record number: [id number] Date of Birth: 1965-12-22 Age: 57 y.o. Gender: male  Primary Care Provider: Vladimir Crofts, FNP Consultants: none Code Status: Full  Preferred Emergency Contact:  Contact Information     Name Relation Home Work Mobile   [name],[name] Mother [phone]   [name]   [phone]      Other Contacts     Name Relation Home Work Mobile   [name],[name] Sister   [phone]        Chief Complaint: LLE pain  Assessment and Plan: [name] is a 57 y.o. male presenting with a LLE pain related to a chronic wound. Differential for this patient's presentation of this includes cellulitis vs osteomyelitis vs additional sepsis. Other sources of infection could be dialysis fistula; however, does not appear infected and no pain. Patient afebrile, no leukocytosis, normal lactic acid and hemodynamically stable, making sepsis less likely. Blood cultures from outside facility grew Pseudomonas.   Assessment & Plan Leg wound, left Diabetic wound, poorly healing. Failed outpatient treatment. S/p doxycyline and IV ceftazidine outpatient.  -admit to FMTS, med-surg, attending Dr [name] -Zosyn managed by pharmacy -Tylenol q6hrs scheduled -oxycodone 5mg  q4hr PRN -consult to wound care -PT eval and treat - ID consult in AM -follow up blood cultures  Type 2 diabetes mellitus with hyperglycemia, with long-term current use of insulin (HCC) Home regimen: Humalog 75/25 5-10 units BID.  - Patient with poor po intake s/t nausea, holding humalog  - Very sensitive SSI  - CBG AC, and at bedtime  - A1c  Chronic kidney disease on chronic dialysis (HCC) Has been receiving some shortened sessions s/t to leg pain. Full session Friday.  - Consult nephrology tomorrow AM  - Continue sevelamer, calcitriol, renavit, gabapentin  - Renal  diet     Chronic and Stable Conditions: HFpEF: Continue carvedilol  HTN: Continue amlodipine  Asthma: Breztri    FEN/GI: renal/carb modified VTE Prophylaxis: heparin  Disposition: admit to med-surg, Dr [name] attending  History of Present Illness:  [name] is a 57 y.o. male presenting with LLE pain. Patient reports increasing pain and chills for the past 2 days. He also reports nausea, and vomiting. LLE wound has been present for several months since he bumped into a bed post. He been treated with antibiotics multiple times for this wound. He was experiencing increasing pain during dialysis about 1 week ago. Blood cultures drawn at that time grew pseudomonas. Pt was started on IV antibiotics (Ceftazidine?) and has received 2 out of 6 doses so far. Pt states he completed his 2 most recent HD sessions (today and Wednesday) but stopped early last Saturday due to severe LLE pain.    In the ED, the patient has been hypertensive and afebrile. Labs were notable for leukocyte count and lactic acid WNL. Troponin was elevated to 69. Blood cultures were collected. Pt received oxycodone 5mg  for pain and was put on 2L [place] for comfort, this was discontinued due to oxygen saturations in the high 90s.  Review Of Systems: Per HPI with the following additions:   Pertinent Past Medical History: ESRD CHF DM II GERD HTN Asthma Sleep apnea Afib (pt states cardiologist d/c'd eliquis last year)  Remainder reviewed in history tab.   Pertinent Past Surgical History: AV fistula placement    Remainder reviewed in history tab.  Pertinent Social History: Tobacco use: no Alcohol use: no Other Substance use:  no Lives with wife per chart review  Pertinent Family History:  Remainder reviewed in history tab.   Important Outpatient Medications: Albuterol PRN Amlodipine 10mg  Breztri  Carvedilol 25mg  BID Doxycycline 100mg  BID Gabapentin 300mg  daily Humulog 75/25 5-10u BID Sevelamer Tramadol  50mg  BID PRN  Remainder reviewed in medication history.   Objective: BP (!) 173/93 (BP Location: Right Arm)   Pulse 75   Temp 98.3 F (36.8 C) (Oral)   Resp 18   SpO2 99%  Exam: General: Middle aged male, sitting up in hospital bed, in NAD Eyes: conjunctiva clear, EOM intact ENTM: moist mucus membranes Neck: supple Cardiovascular: RRR, normal S1/S2, no murmurs, rubs, gallops Respiratory: CTAB, normal WOB on RA, no wheezes, rales, rhonchi Gastrointestinal: normal bowel sounds, soft, nontender, nondistended MSK: dressing in place over LLE wound, clean and dry (see photo taken prior to dressing placement) Derm: see photo Neuro: moving all 4 extremities spontaneously, no focal deficits Psych: appropriate mood and affect    Labs:  CBC BMET  Recent Labs  Lab 11/30/22 1531  WBC 7.4  HGB 11.0*  HCT 34.3*  PLT 465*   Recent Labs  Lab 11/30/22 1531  NA 135  K 3.2*  CL 92*  CO2 28  BUN 18  CREATININE 5.62*  GLUCOSE 161*  CALCIUM 9.2    Pertinent additional labs  Troponin 69 Lactic acid  1.2 EKG: Sinus rhythm, normal p waves, no ST elevation or depression, no peaked T waves   Imaging Studies Performed:  CXR:  Enlarged cardiopericardial silhouette with some vascular congestion   Tib/Fib x-ray: No fracture or dislocation of the left tibia or fibula. Soft tissue edema anteriorly.   [name] Bender, MD 11/30/2022, 5:00 PM PGY-1, [facility]  FPTS Intern pager: [phone], text pages welcome Secure chat group [facility] Teaching Service   Upper Level Attestation I have seen and examined the patient with the resident . I agree with the history, physical, and assessment above with any necessary edits.   [name] Mola, MD  Family Medicine Teaching Service

## 2022-11-30 NOTE — Assessment & Plan Note (Signed)
>>  ASSESSMENT AND PLAN FOR TYPE 2 DIABETES MELLITUS WITH HYPERGLYCEMIA, WITH LONG-TERM CURRENT USE OF INSULIN (HCC) WRITTEN ON 11/30/2022 10:47 PM BY Lockie Mola, MD  Home regimen: Humalog 75/25 5-10 units BID.  - Patient with poor po intake s/t nausea, holding humalog  - Very sensitive SSI  - CBG AC, and at bedtime  - A1c

## 2022-11-30 NOTE — Assessment & Plan Note (Signed)
>>  ASSESSMENT AND PLAN FOR CHRONIC KIDNEY DISEASE ON CHRONIC DIALYSIS (HCC) WRITTEN ON 11/30/2022 10:47 PM BY Lockie Mola, MD  Has been receiving some shortened sessions s/t to leg pain. Full session Friday.  - Consult nephrology tomorrow AM  - Continue sevelamer, calcitriol, renavit, gabapentin  - Renal diet

## 2022-11-30 NOTE — ED Triage Notes (Signed)
Pt reports feeling shob and having n/v all week this week. Went to dialysis today, did receive full treatment, and was sent here afterwards for eval of infection to left lower leg that is worsening.  Pt reports fever/chills at home and is lethargic at time of triage.

## 2022-11-30 NOTE — Progress Notes (Signed)
FMTS Brief Progress Note  S: Resting comfortably in bed.  Patient does report itching to his back that has been there since before he arrived to the hospital.  Also complaining of pain around his leg wound   O: BP (!) 187/108 (BP Location: Right Arm)   Pulse 77   Temp 98.2 F (36.8 C) (Oral)   Resp 19   SpO2 94%   Gen: no acute distress LLE: wound is bandaged, C/D/I, no drainage noted Skin: No rash or erythema noted to back.  Some excoriations noted to the lower back from scratching  A/P: Leg wound - on zosyn - Previously on IV ceftaz for ? +pseudomonas on blood cx, this was considered failed treatment - wound care consult in the AM - ID consult in the AM for abx assistance - f/u blood cultures - pain management - oxycodone 5mg  q4h PRN, tylenol q6h scheduled  Itching - Sarna lotion and Benadryl 25mg  for itching on back  T2DM - Holding home humalog due to poor PO intake - very sensitive SSI - A1C, CBG AC and at bedtime  CKD on chronic dialysis - consult nephrology tomorrow AM - continue sevelamer, calcitriol, renavit, gabapentin  - Orders reviewed. Labs for AM ordered (RFP), which was adjusted as needed. - If condition changes, plan includes pain control, nausea medications as needed, adjusting diabetic regimen.   Para March, DO 11/30/2022, 11:00 PM PGY-1, [facility] Family Medicine Night Resident  Please page [phone] with questions.

## 2022-11-30 NOTE — ED Notes (Signed)
O2 at 2LPM [place] initiated for comfort. O2 at 95% on RA. Will continue to monitor.

## 2022-11-30 NOTE — ED Notes (Signed)
ED TO INPATIENT HANDOFF REPORT  ED Nurse Name and Phone #: Sheilah Mins [phone]  S Name/Age/Gender Tiburcio Bash Noffke 57 y.o. male Room/Bed: 019C/019C  Code Status   Code Status: Full Code  Home/SNF/Other Home Patient oriented to: self, place, time, and situation Is this baseline? Yes   Triage Complete: Triage complete  Chief Complaint Leg wound, left [S81.802A]  Triage Note Pt reports feeling shob and having n/v all week this week. Went to dialysis today, did receive full treatment, and was sent here afterwards for eval of infection to left lower leg that is worsening.  Pt reports fever/chills at home and is lethargic at time of triage.     Allergies No Known Allergies  Level of Care/Admitting Diagnosis ED Disposition     ED Disposition  Admit   Condition  --   Comment  Hospital Area: [facility] [[id number]]  Level of Care: Med-Surg [16]  May admit patient to [facility] or [facility] if equivalent level of care is available:: Yes  Covid Evaluation: Asymptomatic - no recent exposure (last 10 days) testing not required  Diagnosis: Leg wound, left [147829]  Admitting Physician: Lorayne Bender [[id number]]  Attending Physician: Billey Co [[id number]]  Certification:: I certify this patient will need inpatient services for at least 2 midnights  Expected Medical Readiness: 12/02/2022          B Medical/Surgery History Past Medical History:  Diagnosis Date   Acute kidney injury (HCC) 12/28/2014   Adjustment disorder with mixed anxiety and depressed mood 12/28/2014   Asthma    CHF (congestive heart failure) (HCC)    CPAP (continuous positive airway pressure) dependence    Diabetes mellitus without complication (HCC)    DM (diabetes mellitus) type II controlled with renal manifestation (HCC) 12/28/2014   DM (diabetes mellitus) type II controlled, neurological manifestation (HCC) 12/28/2014   Encephalopathy, hypertensive 12/28/2014   GERD  (gastroesophageal reflux disease)    Hypertension    Hypertensive emergency without congestive heart failure 12/27/2014   Pneumonia    Possible Panic disorder 12/28/2014   Renal disorder    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   Sleep apnea    Past Surgical History:  Procedure Laterality Date   AV FISTULA PLACEMENT Left 01/05/2020   Procedure: LEFT ARM BRACHIOCEPHALIC ARTERIOVENOUS (AV) FISTULA;  Surgeon: Chuck Hint, MD;  Location: [facility] OR;  Service: Vascular;  Laterality: Left;   EMPYEMA DRAINAGE Right 05/17/2017   Procedure: EMPYEMA DRAINAGE AND DECORTICATION;  Surgeon: Delight Ovens, MD;  Location: [facility] OR;  Service: Thoracic;  Laterality: Right;   FLEXIBLE BRONCHOSCOPY  05/17/2017   Procedure: FLEXIBLE BRONCHOSCOPY;  Surgeon: Delight Ovens, MD;  Location: [facility] OR;  Service: Thoracic;;   INSERTION OF DIALYSIS CATHETER Right 12/12/2021   Procedure: INSERTION OF RIGHT INTERNAL JUGULAR DIALYSIS CATHETER;  Surgeon: Chuck Hint, MD;  Location: [facility] OR;  Service: Vascular;  Laterality: Right;   IR FLUORO GUIDE CV LINE RIGHT  01/01/2020   IR THORACENTESIS ASP PLEURAL SPACE W/IMG GUIDE  05/17/2017   IR US GUIDE VASC ACCESS RIGHT  01/01/2020   NO PAST SURGERIES     REVISON OF ARTERIOVENOUS FISTULA Left 12/12/2021   Procedure: REVISION OF LEFT ARM FISTULA BY PLICATION;  Surgeon: Chuck Hint, MD;  Location: [facility] OR;  Service: Vascular;  Laterality: Left;   THORACOTOMY/LOBECTOMY Right 05/17/2017   Procedure: RIGHT MINI THORACOTOMY;  Surgeon: Delight Ovens, MD;  Location: [facility] OR;  Service: Thoracic;  Laterality: Right;   VIDEO ASSISTED  THORACOSCOPY (VATS)/EMPYEMA Right 05/17/2017   Procedure: RIGHT VIDEO ASSISTED THORACOSCOPY (VATS)/EMPYEMA;  Surgeon: Delight Ovens, MD;  Location: [facility] OR;  Service: Thoracic;  Laterality: Right;     A IV Location/Drains/Wounds Patient Lines/Drains/Airways Status     Active Line/Drains/Airways     Name Placement date Placement time Site  Days   Peripheral IV 11/30/22 20 G Anterior;Right Forearm 11/30/22  1747  Forearm  less than 1   Fistula / Graft Left Upper arm Arteriovenous fistula 12/12/21  0941  Upper arm  353   Y Chest Tube 1 and 2 05/17/17  1640  -- 2023   Wound / Incision (Open or Dehisced) 10/26/22 Other (Comment) Pretibial Distal;Left Anterior Lower leg/ Shin 10/26/22  0021  Pretibial  35            Intake/Output Last 24 hours No intake or output data in the 24 hours ending 11/30/22 1849  Labs/Imaging Results for orders placed or performed during the hospital encounter of 11/30/22 (from the past 48 hour(s))  Comprehensive metabolic panel     Status: Abnormal   Collection Time: 11/30/22  3:31 PM  Result Value Ref Range   Sodium 135 135 - 145 mmol/L   Potassium 3.2 (L) 3.5 - 5.1 mmol/L   Chloride 92 (L) 98 - 111 mmol/L   CO2 28 22 - 32 mmol/L   Glucose, Bld 161 (H) 70 - 99 mg/dL    Comment: Glucose reference range applies only to samples taken after fasting for at least 8 hours.   BUN 18 6 - 20 mg/dL   Creatinine, Ser 9.52 (H) 0.61 - 1.24 mg/dL   Calcium 9.2 8.9 - 84.1 mg/dL   Total Protein 7.4 6.5 - 8.1 g/dL   Albumin 2.8 (L) 3.5 - 5.0 g/dL   AST 14 (L) 15 - 41 U/L   ALT 11 0 - 44 U/L   Alkaline Phosphatase 76 38 - 126 U/L   Total Bilirubin 0.5 0.3 - 1.2 mg/dL   GFR, Estimated 11 (L) >60 mL/min    Comment: (NOTE) Calculated using the CKD-EPI Creatinine Equation (2021)    Anion gap 15 5 - 15    Comment: Performed at [facility], [address] [place], [place] [address]  CBC with Differential     Status: Abnormal   Collection Time: 11/30/22  3:31 PM  Result Value Ref Range   WBC 7.4 4.0 - 10.5 K/uL   RBC 3.55 (L) 4.22 - 5.81 MIL/uL   Hemoglobin 11.0 (L) 13.0 - 17.0 g/dL   HCT 10.2 (L) 72.5 - 36.6 %   MCV 96.6 80.0 - 100.0 fL   MCH 31.0 26.0 - 34.0 pg   MCHC 32.1 30.0 - 36.0 g/dL   RDW 44.0 34.7 - 42.5 %   Platelets 465 (H) 150 - 400 K/uL   nRBC 0.0 0.0 - 0.2 %   Neutrophils  Relative % 64 %   Neutro Abs 4.7 1.7 - 7.7 K/uL   Lymphocytes Relative 26 %   Lymphs Abs 1.9 0.7 - 4.0 K/uL   Monocytes Relative 7 %   Monocytes Absolute 0.5 0.1 - 1.0 K/uL   Eosinophils Relative 2 %   Eosinophils Absolute 0.2 0.0 - 0.5 K/uL   Basophils Relative 1 %   Basophils Absolute 0.1 0.0 - 0.1 K/uL   Immature Granulocytes 0 %   Abs Immature Granulocytes 0.02 0.00 - 0.07 K/uL    Comment: Performed at [facility], [address], [place], [place] [address]  Troponin I (High Sensitivity)     Status: Abnormal   Collection Time: 11/30/22  3:31 PM  Result Value Ref Range   Troponin I (High Sensitivity) 69 (H) <18 ng/L    Comment: (NOTE) Elevated high sensitivity troponin I (hsTnI) values and significant  changes across serial measurements may suggest ACS but many other  chronic and acute conditions are known to elevate hsTnI results.  Refer to the "Links" section for chest pain algorithms and additional  guidance. Performed at [facility], [address] [place], [place] [address]   I-Stat Lactic Acid, ED     Status: None   Collection Time: 11/30/22  3:36 PM  Result Value Ref Range   Lactic Acid, Venous 1.0 0.5 - 1.9 mmol/L  Sedimentation rate     Status: Abnormal   Collection Time: 11/30/22  5:26 PM  Result Value Ref Range   Sed Rate 106 (H) 0 - 16 mm/hr    Comment: Performed at [facility], [address] [place], [place] [address]  C-reactive protein     Status: Abnormal   Collection Time: 11/30/22  5:26 PM  Result Value Ref Range   CRP 12.5 (H) <1.0 mg/dL    Comment: Performed at [facility], [address] [place], [place] [address]  Troponin I (High Sensitivity)     Status: Abnormal   Collection Time: 11/30/22  5:26 PM  Result Value Ref Range   Troponin I (High Sensitivity) 74 (H) <18 ng/L    Comment: (NOTE) Elevated high sensitivity troponin I (hsTnI) values and significant  changes across serial measurements may suggest ACS but  many other  chronic and acute conditions are known to elevate hsTnI results.  Refer to the "Links" section for chest pain algorithms and additional  guidance. Performed at [facility], [address], [place], [place] [address]   I-Stat Lactic Acid, ED     Status: None   Collection Time: 11/30/22  5:34 PM  Result Value Ref Range   Lactic Acid, Venous 1.2 0.5 - 1.9 mmol/L   DG Tibia/Fibula Left Port  Result Date: 11/30/2022 CLINICAL DATA:  Trauma, pain EXAM: PORTABLE LEFT TIBIA AND FIBULA - 2 VIEW COMPARISON:  11/25/2022 FINDINGS: No fracture or dislocation of the left tibia or fibula. Soft tissue edema anteriorly. Vascular calcinosis. IMPRESSION: No fracture or dislocation of the left tibia or fibula. Soft tissue edema anteriorly. Electronically Signed   By: Jearld Lesch M.D.   On: 11/30/2022 18:23   DG Chest Portable 1 View  Result Date: 11/30/2022 CLINICAL DATA:  Shortness of breath EXAM: PORTABLE CHEST 1 VIEW COMPARISON:  X-ray 11/25/2022 FINDINGS: Underinflation. Borderline cardiopericardial silhouette with some prominence of the central vasculature. No consolidation, pneumothorax or effusion. No edema. Overlapping cardiac leads. Degenerative changes seen along the spine. IMPRESSION: Enlarged cardiopericardial silhouette with some vascular congestion. Electronically Signed   By: Karen Kays M.D.   On: 11/30/2022 18:22    Pending Labs Unresulted Labs (From admission, onward)     Start     Ordered   11/30/22 1644  Blood culture (routine x 2)  BLOOD CULTURE X 2,   R (with STAT occurrences)      11/30/22 1644   Signed and Held  Renal function panel  Tomorrow morning,   R        Signed and Held   Signed and Held  Hemoglobin A1c  Once,   R  Comments: To assess prior glycemic control    Signed and Held            Vitals/Pain Today's Vitals   11/30/22 1645 11/30/22 1730 11/30/22 1759 11/30/22 1815  BP: (!) 176/99 (!) 155/99  (!) 180/96  Pulse: 78 72  72  Resp:  (!) 21 15  15   Temp:      TempSrc:      SpO2: 97% 100%  91%  PainSc:   10-Worst pain ever     Isolation Precautions No active isolations  Medications Medications  oxyCODONE (Oxy IR/ROXICODONE) immediate release tablet 5 mg (5 mg Oral Given 11/30/22 1628)  fentaNYL (SUBLIMAZE) injection 75 mcg (75 mcg Intravenous Given 11/30/22 1758)    Mobility walks     Focused Assessments Wound assessment   R Recommendations: See Admitting Provider Note  Report given to:   Additional Notes:

## 2022-11-30 NOTE — Assessment & Plan Note (Addendum)
Diabetic wound, poorly healing. Failed outpatient treatment. S/p doxycyline and IV ceftazidine outpatient.  -admit to [facility], med-surg, attending Dr Linwood Dibbles -Zosyn managed by pharmacy -Tylenol q6hrs scheduled -oxycodone 5mg  q4hr PRN -consult to wound care -PT eval and treat - ID consult in AM -follow up blood cultures

## 2022-11-30 NOTE — Progress Notes (Signed)
Pharmacy Antibiotic Note  [name] is a 57 y.o. male admitted on 11/30/2022 with  L lower leg wound infection .  Pharmacy has been consulted for Zosyn dosing.  Noted that pt is HD pt - has been receiving Ceftazidime 2gm after HD (started 8/21) and received a dose post-HD today for pseudomonas in blood culture as outpt - cannot see this in Epic.  Plan: Zosyn 2.25gm IV q8h Will f/u HD schedule/tolerance, micro data, and pt's clinical condition     Temp (24hrs), Avg:98.5 F (36.9 C), Min:98.3 F (36.8 C), Max:98.6 F (37 C)  Recent Labs  Lab 11/25/22 1502 11/25/22 1515 11/30/22 1531 11/30/22 1536 11/30/22 1734  WBC 8.5  --  7.4  --   --   CREATININE 9.12*  --  5.62*  --   --   LATICACIDVEN  --  1.7  --  1.0 1.2    Estimated Creatinine Clearance: 17.2 mL/min (A) (by C-G formula based on SCr of 5.62 mg/dL (H)).    No Known Allergies  Antimicrobials this admission: 8/21 Ceftazidime >> 8/23 (pta med at outpt HD) 8/23 Zosyn >>   Microbiology results: 8/23 BCx:   Thank you for allowing pharmacy to be a part of this patient's care.  Christoper Fabian, PharmD, BCPS Please see amion for complete clinical pharmacist phone list 11/30/2022 7:26 PM

## 2022-11-30 NOTE — Assessment & Plan Note (Addendum)
Home regimen: Humalog 75/25 5-10 units BID.  - Patient with poor po intake s/t nausea, holding humalog  - Very sensitive SSI  - CBG AC, and at bedtime  - A1c

## 2022-11-30 NOTE — ED Notes (Signed)
Dialysis RN called at this time and states that pt blood culture was done last week and has positive results for pseudomonas. Ceftazidine 2gms started every other day after dialysis with first treatment on Wednesday and 2nd antibiotic treatment today. Pt also finished a full dialysis session today. Dr.Goldston notified of this conversation. Will continue to monitor.

## 2022-12-01 DIAGNOSIS — R112 Nausea with vomiting, unspecified: Secondary | ICD-10-CM

## 2022-12-01 DIAGNOSIS — R7881 Bacteremia: Secondary | ICD-10-CM

## 2022-12-01 DIAGNOSIS — L97929 Non-pressure chronic ulcer of unspecified part of left lower leg with unspecified severity: Secondary | ICD-10-CM

## 2022-12-01 DIAGNOSIS — S81802S Unspecified open wound, left lower leg, sequela: Secondary | ICD-10-CM

## 2022-12-01 DIAGNOSIS — B965 Pseudomonas (aeruginosa) (mallei) (pseudomallei) as the cause of diseases classified elsewhere: Secondary | ICD-10-CM | POA: Insufficient documentation

## 2022-12-01 DIAGNOSIS — R9431 Abnormal electrocardiogram [ECG] [EKG]: Secondary | ICD-10-CM | POA: Diagnosis not present

## 2022-12-01 HISTORY — DX: Bacteremia: R78.81

## 2022-12-01 LAB — RENAL FUNCTION PANEL
Albumin: 2.6 g/dL — ABNORMAL LOW (ref 3.5–5.0)
Anion gap: 15 (ref 5–15)
BUN: 25 mg/dL — ABNORMAL HIGH (ref 6–20)
CO2: 27 mmol/L (ref 22–32)
Calcium: 9.5 mg/dL (ref 8.9–10.3)
Chloride: 91 mmol/L — ABNORMAL LOW (ref 98–111)
Creatinine, Ser: 7.03 mg/dL — ABNORMAL HIGH (ref 0.61–1.24)
GFR, Estimated: 9 mL/min — ABNORMAL LOW (ref >60)
Glucose, Bld: 120 mg/dL — ABNORMAL HIGH (ref 70–99)
Phosphorus: 6.3 mg/dL — ABNORMAL HIGH (ref 2.5–4.6)
Potassium: 3.3 mmol/L — ABNORMAL LOW (ref 3.5–5.1)
Sodium: 133 mmol/L — ABNORMAL LOW (ref 135–145)

## 2022-12-01 LAB — GLUCOSE, CAPILLARY
Glucose-Capillary: 131 mg/dL — ABNORMAL HIGH (ref 70–99)
Glucose-Capillary: 137 mg/dL — ABNORMAL HIGH (ref 70–99)
Glucose-Capillary: 141 mg/dL — ABNORMAL HIGH (ref 70–99)
Glucose-Capillary: 151 mg/dL — ABNORMAL HIGH (ref 70–99)

## 2022-12-01 MED ORDER — HYDROMORPHONE HCL 1 MG/ML IJ SOLN
0.5000 mg | Freq: Once | INTRAMUSCULAR | Status: AC
  Administered 2022-12-01: 0.5 mg via INTRAVENOUS
  Filled 2022-12-01: qty 1

## 2022-12-01 MED ORDER — CINACALCET HCL 30 MG PO TABS
180.0000 mg | ORAL_TABLET | ORAL | Status: DC
  Administered 2022-12-03: 180 mg via ORAL
  Filled 2022-12-01 (×2): qty 6

## 2022-12-01 MED ORDER — DOXERCALCIFEROL 4 MCG/2ML IV SOLN
7.0000 ug | INTRAVENOUS | Status: DC
  Administered 2022-12-03: 7 ug via INTRAVENOUS
  Filled 2022-12-01 (×2): qty 4

## 2022-12-01 NOTE — Assessment & Plan Note (Signed)
Diagnosed by his HD Center. S/p 2 doses Ceftazidime as an outpatient. Receiving Zosyn here. Presumptive source is this leg wound.  - Zosyn per pharmacy  Abx- Ceftaz (8/21-8/23)        - Zosyn (8/23- )  - Will ask nephro to look into sensitivities on sample obtained at HD center  - Repeat blood cultures are pending

## 2022-12-01 NOTE — Progress Notes (Signed)
1. No-show for appointment    Recent visit to ED. No charge.

## 2022-12-01 NOTE — Consult Note (Signed)
WOC Nurse Consult Note: Reason for Consult:LLE pretibial wound. Followed at the outpatient wound care center since July 2024.  Oncet was traumatic (injury) several months prior. Last seen by Dr. Mikey Bussing on 11/01/22. See photo by Provider uploaded to EMR yesterday. Current POC is with a non formulary product, IodoFlex/iodosorb (cadexomer iodine), an antimicrobial moisture retentive product.  With history of ESRD on HD cannot rule out calciphylaxis. Wound type:trauma Pressure Injury POA: N/A Measurement: 4cm x 2cm with depth unable to be determined due to the presence of nonviable tissue. Wound bed:40% red, 60% nonviable Drainage (amount, consistency, odor) small serous Periwound:dry Dressing procedure/placement/frequency:I will provide Nursing with guidance for daily care using pure hypochlorous acid (Vashe, [place] # [phone]) as a cleanser and as a moisture retentive wound contact layer.This will be topped with dry gauze, and ABD pad and secured with a few turns of Kerlix roll gauze. Heels are to be floated.  Recommend patient return to the care and oversight of the providers at the outpatient wound care center at the prescribed interval post discharge.  WOC nursing team will not follow, but will remain available to this patient, the nursing and medical teams.  Please re-consult if needed.  Thank you for inviting [place] to participate in this patient's Plan of Care.  Ladona Mow, MSN, RN, CNS, GNP, Leda Min, [employer], [employer] phone:  [phone]

## 2022-12-01 NOTE — Evaluation (Signed)
Physical Therapy Evaluation Patient Details Name: [name] MRN: [id number] DOB: 02/11/1966 Today's Date: 12/01/2022  History of Present Illness  Pt is 57 yo admitted to [facility] for painful wound to LLE and pseudomonas bacteremia. PMH: ESRD on HD MWF, CHF, DM II, HTN, OSA, A fib.  Clinical Impression  Pt is presenting close to baseline level of functioning since he has formed wound on LLE. Prior to wound pt reports he was Mod I. Currently pt reports he needs some assistance at home. Pt requires CGA for sit to stand and very short distance gait with heavy reliance on UE for RLE progression during gait. Pt would benefit from a RW in order to improve independence at home for wound healing. At this time no recommended skilled physical therapy services recommended on discharge from acute care hospital setting. Recommending continued skilled physical therapy services in acute setting in order to return pt to PLOF to decrease risk for falls, injury and re-hospitalization.       If plan is discharge home, recommend the following: A little help with walking and/or transfers;Assist for transportation;Assistance with cooking/housework     Equipment Recommendations Rolling walker (2 wheels)     Functional Status Assessment Patient has had a recent decline in their functional status and/or demonstrates limited ability to make significant improvements in function in a reasonable and predictable amount of time     Precautions / Restrictions Precautions Precautions: Fall Restrictions Weight Bearing Restrictions: No      Mobility  Bed Mobility Overal bed mobility: Modified Independent             General bed mobility comments: extra time HOB elevated. Pt states HOB elevates at home.    Transfers Overall transfer level: Needs assistance Equipment used: Rolling walker (2 wheels) Transfers: Sit to/from Stand Sit to Stand: Contact guard assist           General transfer comment: Extra time  and heavy assist of UE on the RW. pt had quick acceleration with sitting increasing risk for falls without proper hand placement.    Ambulation/Gait Ambulation/Gait assistance: Contact guard assist Gait Distance (Feet): 10 Feet Assistive device: Rolling walker (2 wheels) Gait Pattern/deviations: Step-to pattern, Decreased stance time - left, Decreased step length - right, Antalgic Gait velocity: Significantly decreased Gait velocity interpretation: <1.31 ft/sec, indicative of household ambulator   General Gait Details: Pt has very little wgt bearing through the LLE due to pain. Very short steps with step to gait pattern and use use of UE to progress the RLE.  Stairs Stairs:  (not applicable)              Balance Overall balance assessment: Needs assistance Sitting-balance support: Bilateral upper extremity supported Sitting balance-Leahy Scale: Good Sitting balance - Comments: no overt LOB   Standing balance support: Bilateral upper extremity supported, Reliant on assistive device for balance Standing balance-Leahy Scale: Poor Standing balance comment: heavy use of bil UE         Pertinent Vitals/Pain Pain Assessment Pain Assessment: 0-10 Pain Score: 8  Pain Location: L leg pain Pain Descriptors / Indicators: Burning, Aching Pain Intervention(s): Monitored during session, Limited activity within patient's tolerance, Patient requesting pain meds-RN notified    Home Living Family/patient expects to be discharged to:: Private residence Living Arrangements: Spouse/significant other Available Help at Discharge: Family;Available PRN/intermittently Type of Home: House Home Access: Level entry       Home Layout: One level Home Equipment: None      Prior Function  Prior Level of Function : Needs assist;Driving       Physical Assist : Mobility (physical);ADLs (physical)   ADLs (physical): Bathing;Dressing;Toileting Mobility Comments: At night spouse helps pt walk  due to balance. pt states that occasionally he furniture walks around the house due to pain in the leg. Spouse assists pt getting out of the car. ADLs Comments: spouse was helping with showers and dressing. She was assisting with washing his body in the shower. Spouse assisted with donning pants and shoes.     Extremity/Trunk Assessment   Upper Extremity Assessment Upper Extremity Assessment: Overall WFL for tasks assessed    Lower Extremity Assessment Lower Extremity Assessment: LLE deficits/detail;RLE deficits/detail RLE Sensation: history of peripheral neuropathy LLE Deficits / Details: Decreased WB due to pain, no buckling noted in WB LLE Sensation: history of peripheral neuropathy    Cervical / Trunk Assessment Cervical / Trunk Assessment: Normal  Communication   Communication Communication: No apparent difficulties Cueing Techniques: Verbal cues  Cognition Arousal: Alert Behavior During Therapy: WFL for tasks assessed/performed Overall Cognitive Status: Within Functional Limits for tasks assessed        General Comments General comments (skin integrity, edema, etc.): Pt wound was covered with dry dressing. No excessive drainage noted.        Assessment/Plan    PT Assessment Patient needs continued PT services  PT Problem List Decreased strength;Decreased mobility;Decreased activity tolerance;Decreased balance;Pain;Decreased safety awareness       PT Treatment Interventions DME instruction;Therapeutic exercise;Gait training;Balance training;Functional mobility training;Therapeutic activities;Patient/family education    PT Goals (Current goals can be found in the Care Plan section)  Acute Rehab PT Goals Patient Stated Goal: Return home. PT Goal Formulation: With patient Time For Goal Achievement: 12/15/22 Potential to Achieve Goals: Good    Frequency Min 1X/week        AM-PAC PT "6 Clicks" Mobility  Outcome Measure Help needed turning from your back to  your side while in a flat bed without using bedrails?: None Help needed moving from lying on your back to sitting on the side of a flat bed without using bedrails?: None Help needed moving to and from a bed to a chair (including a wheelchair)?: A Little Help needed standing up from a chair using your arms (e.g., wheelchair or bedside chair)?: A Little Help needed to walk in hospital room?: A Little Help needed climbing 3-5 steps with a railing? : A Lot 6 Click Score: 19    End of Session   Activity Tolerance: Patient tolerated treatment well;Patient limited by pain Patient left: in bed;with call bell/phone within reach Nurse Communication: Mobility status PT Visit Diagnosis: Other abnormalities of gait and mobility (R26.89);Difficulty in walking, not elsewhere classified (R26.2)    Time: 3244-0102 PT Time Calculation (min) (ACUTE ONLY): 23 min   Charges:   PT Evaluation $PT Eval Low Complexity: 1 Low PT Treatments $Gait Training: 8-22 mins PT General Charges $$ ACUTE PT VISIT: 1 Visit         Harrel Carina, DPT, CLT  Acute Rehabilitation Services Office: [phone] (Secure chat preferred)   Claudia Desanctis 12/01/2022, 10:29 AM

## 2022-12-01 NOTE — Hospital Course (Addendum)
Gabrielle Armes is a 57 y.o.male with a history of ESRD, CHF, T2DM, GERD, HTN, afib who was admitted to the [facility] Medicine Teaching Service at [facility] for worsening LLE wound. His hospital course is detailed below:  LLE wound Presented with LLE pain related to worsening LLE wound. Failed oral doxycyline and IV ceftazidime treatment. Started on Zosyn here. ID was consulted ***. Wound care was consulted ***.  Other chronic conditions were medically managed with home medications and formulary alternatives as necessary (ESRD, T2DM, HTN, HFpEF, asthma)  PCP Follow-up Recommendations:

## 2022-12-01 NOTE — Assessment & Plan Note (Signed)
Patient with poor po intake s/t nausea. A1c is 7.2%.  - CBG, vsSSI

## 2022-12-01 NOTE — Assessment & Plan Note (Signed)
Diagnosed outpatient and source of infection suspected to be current poor healing wound.  Vitals has been stable and inpatient blood culture still pending. Currently on Zosyn for Pseudomonas coverage.  - Zosyn per pharmacy  - Repeat blood cultures are pending

## 2022-12-01 NOTE — Assessment & Plan Note (Signed)
Admitted for poorly healing wound in his left lower extremity.  Poor healing suspected to be complicated by his history of diabetes.  Presumed to have failed outpatient doxycycline treatment, and was found to have Pseudomonas bacteremia.  Currently being treated with Zosyn for Pseudomonas coverage.  Inpatient blood cultures are currently pending. -WOC consulted and following, appreciate assistance -Zosyn managed by pharmacy -Tylenol q6hrs scheduled -oxycodone 5mg  q4hr PRN -PT eval and treat -follow up repeat blood cultures -RD consult for wound healing

## 2022-12-01 NOTE — Progress Notes (Addendum)
Daily Progress Note Intern Pager: [phone]  Patient name: [name] Medical record number: [id number] Date of birth: Apr 24, 1965 Age: 57 y.o. Gender: male  Primary Care Provider: Vladimir Crofts, FNP Consultants: Nephrology, wound care   Code Status: Full   Pt Overview and Major Events to Date:  8/23- admitted   Assessment and Plan: [name] is a 57 yo male who is admitted for a painful wound to his left leg and pseudomonas bacteremia diagnosed on an outpatient basis. Pertinent PMH/PSH includes ESRD on HD MWF, CHF, DM2, HTN, OSA, A Fib not on Eliquis.  Assessment & Plan Leg wound, left Poorly healing over the past two months or so in this diabetic patient. Seen at wound care center on 7.25. Had ABIs on 8/1 which were actually normal on the Left, though non-compressible on the right. Previously treated with doxycycline as an outpatient, though with new pseudomonas bacteremia diagnosis, it is possible there was a pseudomonal wound infection that was not covered by doxy. The wound does not appear acutely infected at present.  -WOC to see and make recs, appreciate their assistance. Will need ongoing outpatient wound care.  -Zosyn managed by pharmacy -Tylenol q6hrs scheduled -oxycodone 5mg  q4hr PRN -PT eval and treat -follow up repeat blood cultures -RD consult for wound healing  Bacteremia due to Pseudomonas Diagnosed by his HD Center. S/p 2 doses Ceftazidime as an outpatient. Receiving Zosyn here. Presumptive source is this leg wound.  - Zosyn per pharmacy  Abx- Ceftaz (8/21-8/23)        - Zosyn (8/23- )  - Will ask nephro to look into sensitivities on sample obtained at HD center  - Repeat blood cultures are pending  Type 2 diabetes mellitus with hyperglycemia, with long-term current use of insulin (HCC) Patient with poor po intake s/t nausea. A1c is 7.2%.  - CBG, vsSSI Chronic kidney disease on chronic dialysis (HCC) Has been receiving some shortened sessions s/t to leg  pain. Full session Friday but is above dry weight (EDW 103.5kg), hypertensive, and endorsing shortness of breath, wonder if he remains a bit volume up?  - Will consult nephrology for hospital HD - Continue sevelamer, calcitriol, renavit  - Renal diet      Chronic and Stable Problems:  HFpEF: Continue carvedilol  HTN: Continue amlodipine  Asthma: Breztri   FEN/GI: Renal/Carb modified PPx: Heparin SQ Dispo:Home in 1-2 days. Barriers include pain control and would like to see repeat blood cultures negative x2 days.   Subjective:  Tells me he feels generally well today. Pain is reasonably controlled on current regimen.   Objective: Temp:  [98.2 F (36.8 C)-98.6 F (37 C)] 98.2 F (36.8 C) (08/23 2011) Pulse Rate:  [72-83] 77 (08/23 2011) Resp:  [15-21] 19 (08/23 2011) BP: (155-203)/(93-108) 187/108 (08/23 2011) SpO2:  [91 %-100 %] 94 % (08/23 2011) Physical Exam: General: Alert and NAD Cardiovascular: Regular rate and rhythm, transmitted fistula sounds  Respiratory: Normal WOB on RA, lungs clear throughout  Abdomen: Non-tender, non-distended Extremities: LLE wound is bandaged. No erythema, warmth or streaking extending beyond borders of the bandage, though there is some pretibial tenderness proximal to the bandaged site.   Laboratory: Most recent CBC Lab Results  Component Value Date   WBC 7.4 11/30/2022   HGB 11.0 (L) 11/30/2022   HCT 34.3 (L) 11/30/2022   MCV 96.6 11/30/2022   PLT 465 (H) 11/30/2022   Most recent BMP    Latest Ref Rng & Units 11/30/2022  3:31 PM  BMP  Glucose 70 - 99 mg/dL 161   BUN 6 - 20 mg/dL 18   Creatinine 0.96 - 1.24 mg/dL 0.45   Sodium 409 - 811 mmol/L 135   Potassium 3.5 - 5.1 mmol/L 3.2   Chloride 98 - 111 mmol/L 92   CO2 22 - 32 mmol/L 28   Calcium 8.9 - 10.3 mg/dL 9.2      Imaging/Diagnostic Tests: No new imaging, tests  [name] Amel, MD 12/01/2022, 12:01 AM  PGY-3,  Family Medicine FPTS Intern pager:  [phone], text pages welcome Secure chat group [facility] Teaching Service

## 2022-12-01 NOTE — Assessment & Plan Note (Signed)
Had full HD on 8/23.  Usually HD MWF.  Next scheduled HD 8/26 -Neurology following, appreciate recs - Continue sevelamer, calcitriol, renavit  - Renal diet

## 2022-12-01 NOTE — Assessment & Plan Note (Addendum)
Has been receiving some shortened sessions s/t to leg pain. Full session Friday but is above dry weight (EDW 103.5kg), hypertensive, and endorsing shortness of breath, wonder if he remains a bit volume up?  - Will consult nephrology for hospital HD - Continue sevelamer, calcitriol, renavit  - Renal diet

## 2022-12-01 NOTE — Assessment & Plan Note (Signed)
Blood glucose has been appropriate.  - CBG, vsSSI

## 2022-12-01 NOTE — Assessment & Plan Note (Signed)
>>  ASSESSMENT AND PLAN FOR TYPE 2 DIABETES MELLITUS WITH HYPERGLYCEMIA, WITH LONG-TERM CURRENT USE OF INSULIN (HCC) WRITTEN ON 12/01/2022  1:19 AM BY SANFORD, JAMES B, MD  Patient with poor po intake s/t nausea. A1c is 7.2%.  - CBG, vsSSI

## 2022-12-01 NOTE — Consult Note (Signed)
Renal Service Consult Note [facility]  [place] 12/01/2022 Maree Krabbe, MD Requesting Physician: Dr. Miquel Dunn  Reason for Consult: Renal failure  HPI: The patient is a 57 y.o. year-old w/ PMH as below who presented to ED yesterday c/o SOB and N/V for the last week. He went to dialysis yesterday and had full session, sent to ED afterwards for evaluation of infection to the left leg that may be worsening. Pt reported fevers, chills at home and lethargy. In ED bp's were high and pt was afebrile. WBC was up, LA wnl, trop 69, blood cx's sent. Pt rec'd po oxyIR for pain. Pt was admitted. We are asked to see for dialysis.   Pt seen in room, no c/o's today. Left leg hurting and a bit swollen.     ROS - denies CP, no joint pain, no HA, no blurry vision, no rash, no diarrhea, no nausea/ vomiting, no dysuria, no difficulty voiding   Past Medical History  Past Medical History:  Diagnosis Date   Acute kidney injury (HCC) 12/28/2014   Adjustment disorder with mixed anxiety and depressed mood 12/28/2014   Asthma    CHF (congestive heart failure) (HCC)    CPAP (continuous positive airway pressure) dependence    Diabetes mellitus without complication (HCC)    DM (diabetes mellitus) type II controlled with renal manifestation (HCC) 12/28/2014   DM (diabetes mellitus) type II controlled, neurological manifestation (HCC) 12/28/2014   Encephalopathy, hypertensive 12/28/2014   GERD (gastroesophageal reflux disease)    Hypertension    Hypertensive emergency without congestive heart failure 12/27/2014   Pneumonia    Possible Panic disorder 12/28/2014   Renal disorder    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   Sleep apnea    Past Surgical History  Past Surgical History:  Procedure Laterality Date   AV FISTULA PLACEMENT Left 01/05/2020   Procedure: LEFT ARM BRACHIOCEPHALIC ARTERIOVENOUS (AV) FISTULA;  Surgeon: Chuck Hint, MD;  Location: [facility] OR;  Service: Vascular;   Laterality: Left;   EMPYEMA DRAINAGE Right 05/17/2017   Procedure: EMPYEMA DRAINAGE AND DECORTICATION;  Surgeon: Delight Ovens, MD;  Location: [facility] OR;  Service: Thoracic;  Laterality: Right;   FLEXIBLE BRONCHOSCOPY  05/17/2017   Procedure: FLEXIBLE BRONCHOSCOPY;  Surgeon: Delight Ovens, MD;  Location: [facility] OR;  Service: Thoracic;;   INSERTION OF DIALYSIS CATHETER Right 12/12/2021   Procedure: INSERTION OF RIGHT INTERNAL JUGULAR DIALYSIS CATHETER;  Surgeon: Chuck Hint, MD;  Location: [facility] OR;  Service: Vascular;  Laterality: Right;   IR FLUORO GUIDE CV LINE RIGHT  01/01/2020   IR THORACENTESIS ASP PLEURAL SPACE W/IMG GUIDE  05/17/2017   IR US GUIDE VASC ACCESS RIGHT  01/01/2020   NO PAST SURGERIES     REVISON OF ARTERIOVENOUS FISTULA Left 12/12/2021   Procedure: REVISION OF LEFT ARM FISTULA BY PLICATION;  Surgeon: Chuck Hint, MD;  Location: [facility] OR;  Service: Vascular;  Laterality: Left;   THORACOTOMY/LOBECTOMY Right 05/17/2017   Procedure: RIGHT MINI THORACOTOMY;  Surgeon: Delight Ovens, MD;  Location: [facility] OR;  Service: Thoracic;  Laterality: Right;   VIDEO ASSISTED THORACOSCOPY (VATS)/EMPYEMA Right 05/17/2017   Procedure: RIGHT VIDEO ASSISTED THORACOSCOPY (VATS)/EMPYEMA;  Surgeon: Delight Ovens, MD;  Location: [facility] OR;  Service: Thoracic;  Laterality: Right;   Family History  Family History  Problem Relation Age of Onset   Hypertension Mother    Social History  reports that he has never smoked. He has never been exposed to tobacco smoke. He has  never used smokeless tobacco. He reports that he does not drink alcohol and does not use drugs. Allergies No Known Allergies Home medications Prior to Admission medications   Medication Sig Start Date End Date Taking? Authorizing Provider  albuterol (PROVENTIL) (2.5 MG/3ML) 0.083% nebulizer solution Take 3 mLs (2.5 mg total) by nebulization every 6 (six) hours as needed for wheezing or shortness of breath. 09/25/22  Yes Alfonse Spruce, MD  albuterol (VENTOLIN HFA) 108 (90 Base) MCG/ACT inhaler Inhale 2 puffs into the lungs every 6 (six) hours as needed for wheezing or shortness of breath. 09/25/22  Yes Alfonse Spruce, MD  amLODipine (NORVASC) 10 MG tablet Take 1 tablet (10 mg total) by mouth daily. 05/24/22  Yes Rocky Morel, DO  calcitRIOL (ROCALTROL) 0.25 MCG capsule Take 1 capsule (0.25 mcg total) by mouth every Monday, Wednesday, and Friday. 01/08/20  Yes Albertine Grates, MD  carvedilol (COREG) 25 MG tablet Take 1 tablet (25 mg total) by mouth 2 (two) times daily with a meal. 05/24/22  Yes Rocky Morel, DO  doxycycline (VIBRAMYCIN) 100 MG capsule Take 1 capsule (100 mg total) by mouth 2 (two) times daily for 10 days. 11/26/22 12/06/22 Yes Alvira Monday, MD  gabapentin (NEURONTIN) 600 MG tablet Take 600 mg by mouth daily.   Yes [provider]  Insulin Lispro Prot & Lispro (HUMALOG 75/25 MIX) (75-25) 100 UNIT/ML Kwikpen Inject 5-10 Units into the skin 2 (two) times daily with a meal. Patient taking differently: Inject 15 Units into the skin 2 (two) times daily with a meal. 05/24/22  Yes Rocky Morel, DO  levocetirizine (XYZAL) 5 MG tablet Take 1 tablet (5 mg total) by mouth every evening. 09/25/22  Yes Alfonse Spruce, MD  lidocaine (XYLOCAINE) 2 % jelly Apply 1 Application topically daily. 11/26/22  Yes Alvira Monday, MD  multivitamin (RENA-VIT) TABS tablet Take 1 tablet by mouth at bedtime. 05/24/22  Yes Rocky Morel, DO  ondansetron (ZOFRAN-ODT) 4 MG disintegrating tablet Take 4 mg by mouth daily as needed for nausea or vomiting. 07/10/22  Yes [provider]  oxyCODONE (ROXICODONE) 5 MG immediate release tablet Take 1 tablet (5 mg total) by mouth every 4 (four) hours as needed for severe pain. 11/25/22  Yes Dyanne Iha, MD  sevelamer carbonate (RENVELA) 800 MG tablet Take 2-3 tablets (1,600-2,400 mg total) by mouth 3 (three) times daily with meals. 05/24/22  Yes Rocky Morel, DO   traMADol (ULTRAM) 50 MG tablet Take 50 mg by mouth 2 (two) times daily as needed for moderate pain. 10/23/22  Yes [provider]  Vitamin D, Ergocalciferol, (DRISDOL) 1.25 MG (50000 UNIT) CAPS capsule Take 50,000 Units by mouth See admin instructions. Take one tablet by mouth on Monday, Wednesdays and Fridays per patient 08/17/19  Yes [provider]  Accu-Chek Softclix Lancets lancets Use as directed.  For insulin-dependent type 2 diabetes Monitor  glucose 3 times a day Patient taking differently: 1 each by Other route See admin instructions. Use as directed.  For insulin-dependent type 2 diabetes Monitor  glucose 3 times a day 01/06/20   Albertine Grates, MD  gabapentin (NEURONTIN) 300 MG capsule Take 1 capsule (300 mg total) by mouth daily. Patient not taking: Reported on 11/30/2022 05/24/22   Rocky Morel, DO  glucose blood test strip Use as instructed For insulin-dependent type 2 diabetes Monitor  glucose 3 times a day Patient taking differently: 1 each by Other route See admin instructions. Use as instructed For insulin-dependent type 2 diabetes Monitor  glucose 3 times a day 01/06/20   Albertine Grates, MD  Insulin Pen Needle 32G X 4 MM MISC Use as directed up to four times daily Patient taking differently: 1 each by Other route 4 (four) times daily. 05/24/22   Rocky Morel, DO  insulin aspart protamine - aspart (NOVOLOG MIX 70/30 FLEXPEN) (70-30) 100 UNIT/ML FlexPen Inject 15-30 Units into the skin 2 (two) times daily.  05/24/22  [provider]     Vitals:   11/30/22 1815 11/30/22 2011 12/01/22 0549 12/01/22 0820  BP: (!) 180/96 (!) 187/108 (!) 166/93 (!) 182/97  Pulse: 72 77 76 81  Resp: 15 19 19 17   Temp:  98.2 F (36.8 C) 97.9 F (36.6 C) 98 F (36.7 C)  TempSrc:  Oral Oral   SpO2: 91% 94% 93% 95%   Exam Gen alert, no distress No rash, cyanosis or gangrene Sclera anicteric, throat clear  No jvd or bruits Chest clear bilat to bases RRR no MRG Abd soft  ntnd no mass or ascites +bs GU normal male MS no joint effusions or deformity Ext no LE or UE edema, no wounds or ulcers Neuro is alert, Ox 3 , nf    LUA AVF+bruit    Home meds include - albuterol, insulin 75/25 mix, oxyIR prn, tramadol, norvasc 10, rocaltrol 0.25 po mwf, coreg 25 bid, renavite, sevelamer carb 3 ac tid, gabapentin 300 qd     OP HD: MWF SW  4h  400/1.5   103kg   2/2 bath   LUA  AVF   Heparin 4100 - last OP HD 8/23, post wt 102.5kg, coming off very close to dry wt - hectorol 7 mcg IV three times per week - sensipar 180 mg po three times per week - ceftazidime 2 gm IV three times per week 8/23- 12/12/22 - good compliance  Assessment/ Plan: Left leg wound - diabetic wound, poorly healing on outpatient Rx of fortaz IV w/ HD and po doxycyline. He has failed OP rx. Per pmd.  ESRD - on HD MWF. No missed HD, labs/ vol okay. Next HD Monday.  HTN/ volume - as above, no vol excess, 2kg + today. Will follow.  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 Follow.  MBD ckd - CCa in range, and phos slightly high. Cont binders, sensipar and IV vdra  Dm2 - getting insulin per pmd      Vinson Moselle  MD [facility] 12/01/2022, 2:07 PM  Recent Labs  Lab 11/25/22 1502 11/30/22 1531 12/01/22 0449  HGB 10.7* 11.0*  --   ALBUMIN  --  2.8* 2.6*  CALCIUM 10.0 9.2 9.5  PHOS  --   --  6.3*  CREATININE 9.12* 5.62* 7.03*  K 3.8 3.2* 3.3*   Inpatient medications:  acetaminophen  650 mg Oral Q6H   amLODipine  10 mg Oral Daily   [START ON 12/03/2022] calcitRIOL  0.25 mcg Oral Q M,W,F   camphor-menthol   Topical BID   carvedilol  25 mg Oral BID WC   cetirizine  10 mg Oral Daily   gabapentin  300 mg Oral Daily   heparin  5,000 Units Subcutaneous Q8H   insulin aspart  0-6 Units Subcutaneous TID WC   multivitamin  1 tablet Oral QHS   sevelamer carbonate  2,400 mg Oral TID WC   [START ON 12/03/2022] Vitamin D (Ergocalciferol)  50,000 Units Oral Once per day on Monday Wednesday Friday     piperacillin-tazobactam (ZOSYN)  IV  2.25 g (12/01/22 0525)   albuterol, LORazepam, oxyCODONE

## 2022-12-01 NOTE — Progress Notes (Shared)
     Daily Progress Note Intern Pager: [phone]  Patient name: [name] Medical record number: [id number] Date of birth: 02-22-1966 Age: 57 y.o. Gender: male  Primary Care Provider: Vladimir Crofts, FNP Consultants: Nephrology, wound care Code Status: Full  Pt Overview and Major Events to Date:  8/23-admitted  Assessment and Plan: [name] is a 57 year old male who is admitted for nonhealing wound and suspected to have Pseudomonas bacteremia diagnosed outpatient. Pertinent PMH/PSH includes ESRD on HD MWF, CHF, T2DM, HTN, OSA, A-fib.   Assessment & Plan Leg wound, left Admitted for poorly healing wound in his left lower extremity.  Poor healing suspected to be complicated by his history of diabetes.  Presumed to have failed outpatient doxycycline treatment, and was found to have Pseudomonas bacteremia.  Currently being treated with Zosyn for Pseudomonas coverage.  Inpatient blood cultures are currently pending. -WOC consulted and following, appreciate assistance -Zosyn managed by pharmacy -Tylenol q6hrs scheduled -oxycodone 5mg  q4hr PRN -PT eval and treat -follow up repeat blood cultures -RD consult for wound healing  Bacteremia due to Pseudomonas Diagnosed outpatient and source of infection suspected to be current poor healing wound.  Vitals has been stable and inpatient blood culture still pending. Currently on Zosyn for Pseudomonas coverage.  - Zosyn per pharmacy  - Repeat blood cultures are pending  Type 2 diabetes mellitus with hyperglycemia, with long-term current use of insulin (HCC) Blood glucose has been appropriate.  - CBG, vsSSI Chronic kidney disease on chronic dialysis (HCC) Had full HD on 8/23.  Usually HD MWF.  Next scheduled HD 8/26 -Neurology following, appreciate recs - Continue sevelamer, calcitriol, renavit  - Renal diet      FEN/GI: Renal/carb modified PPx: Heparin subcu Dispo:Home in 2-3 days. Barriers include blood culture pending.    Subjective:  ***  Objective: Temp:  [97.9 F (36.6 C)-98.3 F (36.8 C)] 98 F (36.7 C) (08/24 2029) Pulse Rate:  [74-81] 74 (08/24 2029) Resp:  [17-20] 20 (08/24 2029) BP: (143-182)/(86-97) 143/86 (08/24 2029) SpO2:  [93 %-96 %] 96 % (08/24 2029) Physical Exam: General: *** Cardiovascular: *** Respiratory: *** Abdomen: *** Extremities: ***  Laboratory: Most recent CBC Lab Results  Component Value Date   WBC 7.4 11/30/2022   HGB 11.0 (L) 11/30/2022   HCT 34.3 (L) 11/30/2022   MCV 96.6 11/30/2022   PLT 465 (H) 11/30/2022   Most recent BMP    Latest Ref Rng & Units 12/01/2022    4:49 AM  BMP  Glucose 70 - 99 mg/dL 130   BUN 6 - 20 mg/dL 25   Creatinine 8.65 - 1.24 mg/dL 7.84   Sodium 696 - 295 mmol/L 133   Potassium 3.5 - 5.1 mmol/L 3.3   Chloride 98 - 111 mmol/L 91   CO2 22 - 32 mmol/L 27   Calcium 8.9 - 10.3 mg/dL 9.5     Other pertinent labs ***   Imaging/Diagnostic Tests: Radiologist Impression: *** My interpretation: Jerre Simon, MD 12/01/2022, 9:39 PM  PGY-***, [facility] Family Medicine FPTS Intern pager: [phone], text pages welcome Secure chat group [facility] Teaching Service

## 2022-12-01 NOTE — Assessment & Plan Note (Signed)
Poorly healing over the past two months or so in this diabetic patient. Seen at wound care center on 7.25. Had ABIs on 8/1 which were actually normal on the Left, though non-compressible on the right. Previously treated with doxycycline as an outpatient, though with new pseudomonas bacteremia diagnosis, it is possible there was a pseudomonal wound infection that was not covered by doxy. The wound does not appear acutely infected at present.  -WOC to see and make recs, appreciate their assistance. Will need ongoing outpatient wound care.  -Zosyn managed by pharmacy -Tylenol q6hrs scheduled -oxycodone 5mg  q4hr PRN -PT eval and treat -follow up repeat blood cultures -RD consult for wound healing

## 2022-12-01 NOTE — Assessment & Plan Note (Signed)
>>  ASSESSMENT AND PLAN FOR TYPE 2 DIABETES MELLITUS WITH HYPERGLYCEMIA, WITH LONG-TERM CURRENT USE OF INSULIN (HCC) WRITTEN ON 12/01/2022  9:58 PM BY Jerre Simon, MD  Blood glucose has been appropriate.  - CBG, vsSSI

## 2022-12-01 NOTE — Assessment & Plan Note (Signed)
>>  ASSESSMENT AND PLAN FOR CHRONIC KIDNEY DISEASE ON CHRONIC DIALYSIS (HCC) WRITTEN ON 12/01/2022  9:58 PM BY Jerre Simon, MD  Had full HD on 8/23.  Usually HD MWF.  Next scheduled HD 8/26 -Neurology following, appreciate recs - Continue sevelamer, calcitriol, renavit  - Renal diet

## 2022-12-01 NOTE — Assessment & Plan Note (Signed)
>>  ASSESSMENT AND PLAN FOR CHRONIC KIDNEY DISEASE ON CHRONIC DIALYSIS (HCC) WRITTEN ON 12/01/2022  6:00 AM BY SANFORD, JAMES B, MD  Has been receiving some shortened sessions s/t to leg pain. Full session Friday but is above dry weight (EDW 103.5kg), hypertensive, and endorsing shortness of breath, wonder if he remains a bit volume up?  - Will consult nephrology for hospital HD - Continue sevelamer, calcitriol, renavit  - Renal diet

## 2022-12-01 NOTE — Progress Notes (Signed)
New Admission Note:   Arrival Method:  ED stretcher Mental Orientation: a/ox4 Telemetry: n/a Assessment:completed Skin: intact, wound left shin IV: right forearm Pain:7/10 Tubes: n/a Safety Measures:  Admission: completed Orientation: Patient has been oriented to the room, unit and staff.  Family: n/a Belongings: kept at bedside  Orders have been reviewed and implemented. Will continue to monitor the patient. Call light has been placed within reach and bed alarm has been activated.   Fabian Sharp BSN, RN-BC Phone number: [phone]

## 2022-12-02 DIAGNOSIS — R112 Nausea with vomiting, unspecified: Secondary | ICD-10-CM | POA: Diagnosis not present

## 2022-12-02 DIAGNOSIS — N186 End stage renal disease: Secondary | ICD-10-CM

## 2022-12-02 DIAGNOSIS — B965 Pseudomonas (aeruginosa) (mallei) (pseudomallei) as the cause of diseases classified elsewhere: Secondary | ICD-10-CM

## 2022-12-02 DIAGNOSIS — R9431 Abnormal electrocardiogram [ECG] [EKG]: Secondary | ICD-10-CM

## 2022-12-02 DIAGNOSIS — R7881 Bacteremia: Secondary | ICD-10-CM

## 2022-12-02 DIAGNOSIS — S81802S Unspecified open wound, left lower leg, sequela: Secondary | ICD-10-CM | POA: Diagnosis not present

## 2022-12-02 LAB — RENAL FUNCTION PANEL
Albumin: 2.7 g/dL — ABNORMAL LOW (ref 3.5–5.0)
Anion gap: 18 — ABNORMAL HIGH (ref 5–15)
BUN: 36 mg/dL — ABNORMAL HIGH (ref 6–20)
CO2: 24 mmol/L (ref 22–32)
Calcium: 9.5 mg/dL (ref 8.9–10.3)
Chloride: 92 mmol/L — ABNORMAL LOW (ref 98–111)
Creatinine, Ser: 9.46 mg/dL — ABNORMAL HIGH (ref 0.61–1.24)
GFR, Estimated: 6 mL/min — ABNORMAL LOW (ref >60)
Glucose, Bld: 129 mg/dL — ABNORMAL HIGH (ref 70–99)
Phosphorus: 7.5 mg/dL — ABNORMAL HIGH (ref 2.5–4.6)
Potassium: 4.2 mmol/L (ref 3.5–5.1)
Sodium: 134 mmol/L — ABNORMAL LOW (ref 135–145)

## 2022-12-02 LAB — GLUCOSE, CAPILLARY
Glucose-Capillary: 132 mg/dL — ABNORMAL HIGH (ref 70–99)
Glucose-Capillary: 142 mg/dL — ABNORMAL HIGH (ref 70–99)
Glucose-Capillary: 179 mg/dL — ABNORMAL HIGH (ref 70–99)
Glucose-Capillary: 154 mg/dL — ABNORMAL HIGH (ref 70–99)

## 2022-12-02 MED ORDER — CETIRIZINE HCL 10 MG PO TABS
20.0000 mg | ORAL_TABLET | Freq: Every day | ORAL | Status: DC
  Administered 2022-12-02: 20 mg via ORAL
  Filled 2022-12-02: qty 2

## 2022-12-02 MED ORDER — NEPRO/CARBSTEADY PO LIQD
237.0000 mL | Freq: Two times a day (BID) | ORAL | Status: DC
  Administered 2022-12-03: 237 mL via ORAL

## 2022-12-02 MED ORDER — CETIRIZINE HCL 10 MG PO TABS
10.0000 mg | ORAL_TABLET | Freq: Every day | ORAL | Status: DC
  Administered 2022-12-03: 10 mg via ORAL
  Filled 2022-12-02: qty 1

## 2022-12-02 MED ORDER — CHLORHEXIDINE GLUCONATE CLOTH 2 % EX PADS
6.0000 | MEDICATED_PAD | Freq: Every day | CUTANEOUS | Status: DC
  Administered 2022-12-02 – 2022-12-03 (×2): 6 via TOPICAL

## 2022-12-02 NOTE — Progress Notes (Signed)
Initial Nutrition Assessment  DOCUMENTATION CODES:   Not applicable  INTERVENTION:  - Add Nepro Shake po BID, each supplement provides 425 kcal and 19 grams protein  NUTRITION DIAGNOSIS:   Increased nutrient needs related to wound healing as evidenced by estimated needs.  GOAL:   Patient will meet greater than or equal to 90% of their needs  MONITOR:   PO intake, Supplement acceptance  REASON FOR ASSESSMENT:   Consult Wound healing  ASSESSMENT:   57 y.o. male admits related to LLE pain. PMH includes: ESRD on HD, CHF, T2DM, GERD, HTN, asthma, sleep apnea, afib. Pt is currently receiving medical management related to left leg wound.  Meds reviewed: calcitriol, sliding scale insulin, rena-vit, renvela, Vit D. Labs reviewed: Na low, BUN/Creatinine elevated, phos elevated.   RD unable to reach pt via phone. Pt is currently on a Renal/CHO mod diet, 1200 mL fluid. Per record, the pt has eaten 50-100% of his meals since admission. Pt with large wound to L leg. Pt with increased nutrient needs related to wound healing. RD will add Nepro BID. Will continue to monitor PO intakes.   NUTRITION - FOCUSED PHYSICAL EXAM:  Remote assessment.  Diet Order:   Diet Order             Diet renal/carb modified with fluid restriction Diet-HS Snack? Nothing; Fluid restriction: 1200 mL Fluid; Room service appropriate? Yes; Fluid consistency: Thin  Diet effective now                   EDUCATION NEEDS:   Not appropriate for education at this time  Skin:  Skin Assessment: Skin Integrity Issues: Skin Integrity Issues:: Other (Comment) Other: L Leg wound  Last BM:  12/01/22  Height:   Ht Readings from Last 1 Encounters:  11/25/22 5\' 7"  (1.702 m)    Weight:   Wt Readings from Last 1 Encounters:  11/25/22 105.2 kg    Ideal Body Weight:     BMI:  There is no height or weight on file to calculate BMI.  Estimated Nutritional Needs:   Kcal:  2600-3000 kcals  Protein:   130-150 gm  Fluid:  </= 1.2 L  Bethann Humble, RD, LDN, CNSC.

## 2022-12-02 NOTE — Progress Notes (Signed)
Subjective:  seen in rm  .denies sob , leg feels better , nest HD tomor on schedule   Objective Vital signs in last 24 hours: Vitals:   12/02/22 0730 12/02/22 1444 12/02/22 1446 12/02/22 1450  BP: (!) 159/91  (!) 167/102   Pulse: 71  77   Resp: 17     Temp: (!) 97.5 F (36.4 C)     TempSrc: Oral     SpO2: 95% (!) 89% 92% 95%   Weight change:   Physical Exam: General: Alert  , pleasant  NAD  Heart: RRR, 1/6 sem  vs transferred AVF  sounds  Lungs: cta  nonlabored  Abdomen: NABS, Soft,  NT, nD Extremities:  L Lower Leg bandaged dry / clean / trace bipedal edema  Dialysis Access: LUA AVF  + bruit    Home meds include - albuterol, insulin 75/25 mix, oxyIR prn, tramadol, norvasc 10, rocaltrol 0.25 po mwf, coreg 25 bid, renavite, sevelamer carb 3 ac tid, gabapentin 300 qd        OP HD: MWF SW  4h  400/1.5   103kg   2/2 bath   LUA  AVF   Heparin 4100 - last OP HD 8/23, post wt 102.5kg, coming off very close to dry wt - hectorol 7 mcg IV three times per week - sensipar 180 mg po three times per week - ceftazidime 2 gm IV three times per week 8/23- 12/12/22 - good compliance   Problem/Plan: Left leg wound - diabetic wound, poorly healing on outpatient Rx of fortaz IV w/ HD and po doxycyline. He has failed OP rx. Per pmd.  ESRD - on HD MWF. No missed HD, labs/ vol okay. Next HD Monday. 8/26  k 4.2 HTN/ volume - as above, no vol excess, bp mildly up  on amlodipine , hd should help in am  use prns  until Will follow.  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 Follow.  MBD ckd - CCa in range, and phos slightly high. Cont binders, sensipar and IV vdra  Dm2 - getting insulin per pmd   [name] Pastel, PA-C [facility] Beeper [phone] 12/02/2022,2:59 PM  LOS: 2 days   Labs: Basic Metabolic Panel: Recent Labs  Lab 11/30/22 1531 12/01/22 0449 12/02/22 0554  NA 135 133* 134*  K 3.2* 3.3* 4.2  CL 92* 91* 92*  CO2 28 27 24   GLUCOSE 161* 120* 129*  BUN 18  25* 36*  CREATININE 5.62* 7.03* 9.46*  CALCIUM 9.2 9.5 9.5  PHOS  --  6.3* 7.5*   Liver Function Tests: Recent Labs  Lab 11/30/22 1531 12/01/22 0449 12/02/22 0554  AST 14*  --   --   ALT 11  --   --   ALKPHOS 76  --   --   BILITOT 0.5  --   --   PROT 7.4  --   --   ALBUMIN 2.8* 2.6* 2.7*   No results for input(s): "LIPASE", "AMYLASE" in the last 168 hours. No results for input(s): "AMMONIA" in the last 168 hours. CBC: Recent Labs  Lab 11/25/22 1502 11/30/22 1531  WBC 8.5 7.4  NEUTROABS  --  4.7  HGB 10.7* 11.0*  HCT 33.9* 34.3*  MCV 96.0 96.6  PLT 429* 465*   Cardiac Enzymes: No results for input(s): "CKTOTAL", "CKMB", "CKMBINDEX", "TROPONINI" in the last 168 hours. CBG: Recent Labs  Lab 12/01/22 1106 12/01/22 1703 12/01/22 2114 12/02/22 0732 12/02/22 1151  GLUCAP  137* 141* 151* 132* 142*    Studies/Results: DG Tibia/Fibula Left Port  Result Date: 11/30/2022 CLINICAL DATA:  Trauma, pain EXAM: PORTABLE LEFT TIBIA AND FIBULA - 2 VIEW COMPARISON:  11/25/2022 FINDINGS: No fracture or dislocation of the left tibia or fibula. Soft tissue edema anteriorly. Vascular calcinosis. IMPRESSION: No fracture or dislocation of the left tibia or fibula. Soft tissue edema anteriorly. Electronically Signed   By: Jearld Lesch M.D.   On: 11/30/2022 18:23   DG Chest Portable 1 View  Result Date: 11/30/2022 CLINICAL DATA:  Shortness of breath EXAM: PORTABLE CHEST 1 VIEW COMPARISON:  X-ray 11/25/2022 FINDINGS: Underinflation. Borderline cardiopericardial silhouette with some prominence of the central vasculature. No consolidation, pneumothorax or effusion. No edema. Overlapping cardiac leads. Degenerative changes seen along the spine. IMPRESSION: Enlarged cardiopericardial silhouette with some vascular congestion. Electronically Signed   By: Karen Kays M.D.   On: 11/30/2022 18:22   Medications:  piperacillin-tazobactam (ZOSYN)  IV 2.25 g (12/02/22 1336)    acetaminophen  650 mg  Oral Q6H   amLODipine  10 mg Oral Daily   [START ON 12/03/2022] calcitRIOL  0.25 mcg Oral Q M,W,F   camphor-menthol   Topical BID   carvedilol  25 mg Oral BID WC   cetirizine  20 mg Oral Daily   Chlorhexidine Gluconate Cloth  6 each Topical Daily   [START ON 12/03/2022] cinacalcet  180 mg Oral Q M,W,F   [START ON 12/03/2022] doxercalciferol  7 mcg Intravenous Q M,W,F-HD   gabapentin  300 mg Oral Daily   heparin  5,000 Units Subcutaneous Q8H   insulin aspart  0-6 Units Subcutaneous TID WC   multivitamin  1 tablet Oral QHS   sevelamer carbonate  2,400 mg Oral TID WC   [START ON 12/03/2022] Vitamin D (Ergocalciferol)  50,000 Units Oral Once per day on Monday Wednesday Friday

## 2022-12-02 NOTE — Progress Notes (Addendum)
Called ID for assistance with antibiotic regimen.  In the absence of blood culture records from outpatient dialysis center and negative blood cultures here, recommendations were to consult vascular surgery to assess whether fistula is infected. Also recommended getting blood culture from fistula with nephrologist help.

## 2022-12-02 NOTE — Plan of Care (Signed)
  Problem: Education: Goal: Ability to describe self-care measures that may prevent or decrease complications (Diabetes Survival Skills Education) will improve Outcome: Progressing Goal: Individualized Educational Video(s) Outcome: Progressing   Problem: Coping: Goal: Ability to adjust to condition or change in health will improve Outcome: Progressing   Problem: Fluid Volume: Goal: Ability to maintain a balanced intake and output will improve Outcome: Progressing   Problem: Nutritional: Goal: Maintenance of adequate nutrition will improve Outcome: Progressing Goal: Progress toward achieving an optimal weight will improve Outcome: Progressing   Problem: Skin Integrity: Goal: Risk for impaired skin integrity will decrease Outcome: Progressing   Problem: Education: Goal: Knowledge of General Education information will improve Description: Including pain rating scale, medication(s)/side effects and non-pharmacologic comfort measures Outcome: Progressing   Problem: Clinical Measurements: Goal: Ability to maintain clinical measurements within normal limits will improve Outcome: Progressing Goal: Will remain free from infection Outcome: Progressing Goal: Diagnostic test results will improve Outcome: Progressing Goal: Respiratory complications will improve Outcome: Progressing Goal: Cardiovascular complication will be avoided Outcome: Progressing

## 2022-12-03 ENCOUNTER — Other Ambulatory Visit (HOSPITAL_COMMUNITY): Payer: Self-pay

## 2022-12-03 DIAGNOSIS — R112 Nausea with vomiting, unspecified: Secondary | ICD-10-CM

## 2022-12-03 DIAGNOSIS — S81802S Unspecified open wound, left lower leg, sequela: Secondary | ICD-10-CM | POA: Diagnosis not present

## 2022-12-03 DIAGNOSIS — R9431 Abnormal electrocardiogram [ECG] [EKG]: Secondary | ICD-10-CM | POA: Diagnosis not present

## 2022-12-03 DIAGNOSIS — S81802A Unspecified open wound, left lower leg, initial encounter: Secondary | ICD-10-CM

## 2022-12-03 LAB — CBC WITH DIFFERENTIAL/PLATELET
Abs Immature Granulocytes: 0.03 10*3/uL (ref 0.00–0.07)
Basophils Absolute: 0 10*3/uL (ref 0.0–0.1)
Basophils Relative: 0 %
Eosinophils Absolute: 0.3 10*3/uL (ref 0.0–0.5)
Eosinophils Relative: 4 %
HCT: 30.3 % — ABNORMAL LOW (ref 39.0–52.0)
Hemoglobin: 9.8 g/dL — ABNORMAL LOW (ref 13.0–17.0)
Immature Granulocytes: 0 %
Lymphocytes Relative: 19 %
Lymphs Abs: 1.5 10*3/uL (ref 0.7–4.0)
MCH: 30.8 pg (ref 26.0–34.0)
MCHC: 32.3 g/dL (ref 30.0–36.0)
MCV: 95.3 fL (ref 80.0–100.0)
Monocytes Absolute: 0.5 10*3/uL (ref 0.1–1.0)
Monocytes Relative: 7 %
Neutro Abs: 5.5 10*3/uL (ref 1.7–7.7)
Neutrophils Relative %: 70 %
Platelets: 469 10*3/uL — ABNORMAL HIGH (ref 150–400)
RBC: 3.18 MIL/uL — ABNORMAL LOW (ref 4.22–5.81)
RDW: 13.3 % (ref 11.5–15.5)
WBC: 7.9 10*3/uL (ref 4.0–10.5)
nRBC: 0 % (ref 0.0–0.2)

## 2022-12-03 LAB — RENAL FUNCTION PANEL
Albumin: 2.5 g/dL — ABNORMAL LOW (ref 3.5–5.0)
Anion gap: 16 — ABNORMAL HIGH (ref 5–15)
BUN: 46 mg/dL — ABNORMAL HIGH (ref 6–20)
CO2: 26 mmol/L (ref 22–32)
Calcium: 9.5 mg/dL (ref 8.9–10.3)
Chloride: 89 mmol/L — ABNORMAL LOW (ref 98–111)
Creatinine, Ser: 11.55 mg/dL — ABNORMAL HIGH (ref 0.61–1.24)
GFR, Estimated: 5 mL/min — ABNORMAL LOW (ref >60)
Glucose, Bld: 178 mg/dL — ABNORMAL HIGH (ref 70–99)
Phosphorus: 8.1 mg/dL — ABNORMAL HIGH (ref 2.5–4.6)
Potassium: 4 mmol/L (ref 3.5–5.1)
Sodium: 131 mmol/L — ABNORMAL LOW (ref 135–145)

## 2022-12-03 LAB — GLUCOSE, CAPILLARY: Glucose-Capillary: 162 mg/dL — ABNORMAL HIGH (ref 70–99)

## 2022-12-03 LAB — HEPATITIS B SURFACE ANTIGEN: Hepatitis B Surface Ag: NONREACTIVE

## 2022-12-03 MED ORDER — TRAMADOL HCL 50 MG PO TABS
50.0000 mg | ORAL_TABLET | Freq: Two times a day (BID) | ORAL | 0 refills | Status: AC | PRN
  Filled 2022-12-03: qty 10, 5d supply, fill #0

## 2022-12-03 MED ORDER — NEPRO/CARBSTEADY PO LIQD
237.0000 mL | Freq: Two times a day (BID) | ORAL | 0 refills | Status: DC
  Filled 2022-12-03: qty 1000, 2d supply, fill #0

## 2022-12-03 MED ORDER — HEPARIN SODIUM (PORCINE) 1000 UNIT/ML IJ SOLN
INTRAMUSCULAR | Status: AC
  Administered 2022-12-03: 1000 [IU]
  Filled 2022-12-03: qty 4

## 2022-12-03 MED ORDER — ACETAMINOPHEN 325 MG PO TABS: 650.0000 mg | ORAL_TABLET | Freq: Four times a day (QID) | ORAL | Status: DC

## 2022-12-03 MED ORDER — CAMPHOR-MENTHOL 0.5-0.5 % EX LOTN
TOPICAL_LOTION | Freq: Two times a day (BID) | CUTANEOUS | 0 refills | Status: AC
  Filled 2022-12-03: qty 222, fill #0

## 2022-12-03 MED ORDER — PROSOURCE PLUS PO LIQD
30.0000 mL | Freq: Two times a day (BID) | ORAL | Status: DC
  Administered 2022-12-03: 30 mL via ORAL
  Filled 2022-12-03 (×2): qty 30

## 2022-12-03 MED ORDER — SEVELAMER CARBONATE 800 MG PO TABS
2400.0000 mg | ORAL_TABLET | Freq: Three times a day (TID) | ORAL | 0 refills | Status: DC
  Filled 2022-12-03: qty 270, 30d supply, fill #0

## 2022-12-03 MED ORDER — PROSOURCE PLUS PO LIQD
30.0000 mL | Freq: Two times a day (BID) | ORAL | 0 refills | Status: DC
  Filled 2022-12-03: qty 887, 15d supply, fill #0

## 2022-12-03 NOTE — Progress Notes (Addendum)
[facility] Progress Note   Subjective:   Seen at onset of HD - feels ok. 3L UFG. No CP/dyspnea. Leg pain stable for now. I called his outpatient HD unit to clarify the cultures that he had:  - 8/16: Showed team the wound, noted to have green drainage - wound culture to L leg only -> grew pseudomonas which was sensitive ONLY to ceftazidime  - He never had blood cultures as outpatient.  - The Cx didn't return until 8/19 - abx ordered at that time, but he did not come to that HD treatment (instead was in ED that day) - therefore didn't get 1st dose abx until Wed 8/21, then 8/23  Objective Vitals:   12/03/22 0901 12/03/22 0915 12/03/22 0930 12/03/22 1000  BP: (!) 167/101 (!) 161/96 (!) 166/104 (!) 167/101  Pulse: 69 65 67 68  Resp: 14 17 13 13   Temp: 97.6 F (36.4 C)     TempSrc:      SpO2: 95% 95% 97% 97%  Weight: 103.4 kg      Physical Exam General: Well appearing man, NAD. Room air Heart: RRR; no murmur Lungs: CTA anteriorly Abdomen: soft Extremities: No LE edema; L calf area bandaged Dialysis Access: LUE AVF + bruit  Additional Objective Labs: Basic Metabolic Panel: Recent Labs  Lab 11/30/22 1531 12/01/22 0449 12/02/22 0554  NA 135 133* 134*  K 3.2* 3.3* 4.2  CL 92* 91* 92*  CO2 28 27 24   GLUCOSE 161* 120* 129*  BUN 18 25* 36*  CREATININE 5.62* 7.03* 9.46*  CALCIUM 9.2 9.5 9.5  PHOS  --  6.3* 7.5*   Liver Function Tests: Recent Labs  Lab 11/30/22 1531 12/01/22 0449 12/02/22 0554  AST 14*  --   --   ALT 11  --   --   ALKPHOS 76  --   --   BILITOT 0.5  --   --   PROT 7.4  --   --   ALBUMIN 2.8* 2.6* 2.7*   CBC: Recent Labs  Lab 11/30/22 1531  WBC 7.4  NEUTROABS 4.7  HGB 11.0*  HCT 34.3*  MCV 96.6  PLT 465*   Blood Culture    Component Value Date/Time   SDES BLOOD RIGHT ANTECUBITAL 11/30/2022 1726   SPECREQUEST  11/30/2022 1726    BOTTLES DRAWN AEROBIC AND ANAEROBIC Blood Culture adequate volume   CULT  11/30/2022 1726    NO  GROWTH 2 DAYS Performed at [facility], [address], [place], [place] [address]    REPTSTATUS PENDING 11/30/2022 1726   Medications:  piperacillin-tazobactam (ZOSYN)  IV 2.25 g (12/03/22 0516)    acetaminophen  650 mg Oral Q6H   amLODipine  10 mg Oral Daily   calcitRIOL  0.25 mcg Oral Q M,W,F   camphor-menthol   Topical BID   carvedilol  25 mg Oral BID WC   cetirizine  10 mg Oral Daily   Chlorhexidine Gluconate Cloth  6 each Topical Daily   cinacalcet  180 mg Oral Q M,W,F   doxercalciferol  7 mcg Intravenous Q M,W,F-HD   feeding supplement (NEPRO CARB STEADY)  237 mL Oral BID BM   gabapentin  300 mg Oral Daily   heparin  5,000 Units Subcutaneous Q8H   insulin aspart  0-6 Units Subcutaneous TID WC   multivitamin  1 tablet Oral QHS   sevelamer carbonate  2,400 mg Oral TID WC   Vitamin D (Ergocalciferol)  50,000 Units Oral Once per day  on Monday Wednesday Friday    Dialysis Orders: MWF SW  4h  400/1.5   103kg   2/2 bath   LUA  AVF   Heparin 4100 - last OP HD 8/23, post wt 102.5kg, coming off very close to dry wt - hectorol 7 mcg IV three times per week - sensipar 180 mg po three times per week - ceftazidime 2 gm IV three times per week 8/23- 12/12/22 - good compliance   Problem/Plan: Left leg wound: Per Epic chart - this has been present since May 2024 (wound clinic note 11/01/22 details this). See subjective above, superinfection to wound - wound Cx grew pseudomonas. Does not appear that he failed outpatient abx - only got 2 doses. Blood Cx 8/23 here are negative, was not bacteremic as outpatient. 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 Consider skin biopsy. Had ABIs earlier this month - has arterial calcifications. Would likely benefit from LE angiography. ESRD - on HD MWF - HD now, 3L UFG. HTN/ volume: BP high, 3L UFG - follow. Continue home  meds. Anemia of ESRD: Hgb 11 - no ESA for now. Secondary HPTH: CorrCa high, Phos high - will hold VDRA and Vit D for now. Continue binders, sensipar.  T2DM: On insulin, per primary.  Ozzie Hoyle, PA-C 12/03/2022, 10:26 AM  [facility]  Seen and examined independently.  Agree with note and exam as documented above by physician extender and as noted here.  See also my procedure note from today  Estanislado Emms, MD 12/03/2022  3:56 PM

## 2022-12-03 NOTE — Progress Notes (Addendum)
Daily Progress Note Intern Pager: [phone]  Patient name: [name] Medical record number: [id number] Date of birth: [birth year] Age: 57 y.o. Gender: male  Primary Care Provider: Vladimir Crofts, FNP Consultants: nephrology, wound care Code Status: full  Pt Overview and Major Events to Date:  8/23 - admitted  Assessment and Plan:  57 year old male who was admitted initially for nonhealing wound to his left lower extremity, suspected to have Pseudomonas bacteremia. Today, patient was offered to have vascular surgery come and assess his fistula for infection, however patient opted to go home today and continue his antibiotics outpatient. Will plan to do IV Ceftazidime during dialysis x2 weeks per ID, send home with tramadol 50mg  BID for pain Assessment & Plan Leg wound, left Admitted for poorly healing wound in his left lower extremity.  Poor healing suspected to be complicated by his history of diabetes.  Presumed to have failed outpatient Ceftaz treatment, and was found to have Pseudomonas bacteremia.  Currently being treated with Zosyn for Pseudomonas coverage.  Inpatient blood cultures are currently pending.  Wound care was consulted who recommend nurse managing wound using pure hypochlorous acid (Vashe, Lawson # [phone]) as a cleanser and as a moisture retentive wound contact layer.  -WOC consulted , appreciate assistance -Continue Zosyn managed by pharmacy -Tylenol q6hrs scheduled -oxycodone 5mg  q4hr PRN -follow up repeat blood cultures - no growth 3 days -RD consult for wound healing  Bacteremia due to Pseudomonas Diagnosed outpatient and source of infection suspected to be current poor healing wound.  Vitals have been stable and inpatient blood culture still pending. Currently on Zosyn for Pseudomonas coverage.  - Zosyn per pharmacy  - Repeat blood cultures are pending, no growth 3 days Type 2 diabetes mellitus with hyperglycemia, with long-term current use of insulin  (HCC) - CBG this morning 162, stable - CBG, vsSSI Chronic kidney disease on chronic dialysis (HCC) Had full HD this morning. Pt is MWF HD pt - Nephrology following, appreciate recs - Continue sevelamer, calcitriol, renavit  - Renal diet  - RFP in process this morning Prolonged QT interval - EKG 8/25 with Qtc 494 - caution with QT prolonging meds   Chronic and Stable Problems:  HTN - amlodipine 10mg  every day, coreg 25mg  BID A-fib - no home meds, no a-fib here in hospital noted CHF OSA  FEN/GI: renal/carb modified PPx: heparin Dispo:Home today.   Subjective:  Doing well today.  Patient was hypertensive overnight, highest 167/101.  Patient does appear to have been hypertensive for the last few days. Some pain in his left leg.  Does request some pain medicine to go home with.  States oxycodone makes him itch.  Objective: Temp:  [97.6 F (36.4 C)-98 F (36.7 C)] 97.6 F (36.4 C) (08/26 0901) Pulse Rate:  [65-77] 69 (08/26 1230) Resp:  [13-19] 13 (08/26 1230) BP: (153-176)/(81-104) 174/98 (08/26 1230) SpO2:  [89 %-97 %] 96 % (08/26 1230) Weight:  [103.4 kg-104.4 kg] 103.4 kg (08/26 0901) Physical Exam: General: Awake, alert, no acute distress Cardiovascular: Regular rate and rhythm, no murmurs Respiratory: Clear to auscultation bilaterally, no increased work of breathing Abdomen: Soft, no tenderness to palpation Extremities: Left lower extremity with bandage, appears clean/dry/intact.  No apparent surrounding edema.  Right lower extremity no swelling or tenderness.  Left AV fistula with good thrill palpable, no erythema, warmth.  Laboratory: Most recent CBC Lab Results  Component Value Date   WBC 7.9 12/03/2022   HGB 9.8 (L) 12/03/2022   HCT  30.3 (L) 12/03/2022   MCV 95.3 12/03/2022   PLT 469 (H) 12/03/2022   Most recent BMP    Latest Ref Rng & Units 12/03/2022    9:15 AM  BMP  Glucose 70 - 99 mg/dL 161   BUN 6 - 20 mg/dL 46   Creatinine 0.96 - 1.24 mg/dL 04.54    Sodium 098 - 119 mmol/L 131   Potassium 3.5 - 5.1 mmol/L 4.0   Chloride 98 - 111 mmol/L 89   CO2 22 - 32 mmol/L 26   Calcium 8.9 - 10.3 mg/dL 9.5     Imaging/Diagnostic Tests: None  Zakariah Dejarnette, DO 12/03/2022, 12:54 PM  PGY-1, [facility] FPTS Intern pager: [phone], text pages welcome Secure chat group [facility] Teaching Service

## 2022-12-03 NOTE — Assessment & Plan Note (Addendum)
Had full HD this morning. Pt is MWF HD pt - Nephrology following, appreciate recs - Continue sevelamer, calcitriol, renavit  - Renal diet  - RFP in process this morning

## 2022-12-03 NOTE — Procedures (Signed)
Seen and examined on dialysis.  Procedure supervised.  Blood pressure 161/96 and HR 69.  Left AVF in use.  Tolerating goal.    Estanislado Emms, MD 12/03/2022  9:22 AM

## 2022-12-03 NOTE — Assessment & Plan Note (Signed)
>>  ASSESSMENT AND PLAN FOR CHRONIC KIDNEY DISEASE ON CHRONIC DIALYSIS (HCC) WRITTEN ON 12/03/2022  9:49 AM BY EVERHART, KIRSTIE, DO  Had full HD this morning. Pt is MWF HD pt - Nephrology following, appreciate recs - Continue sevelamer, calcitriol, renavit  - Renal diet  - RFP in process this morning

## 2022-12-03 NOTE — Assessment & Plan Note (Addendum)
Diagnosed outpatient and source of infection suspected to be current poor healing wound.  Vitals have been stable and inpatient blood culture still pending. Currently on Zosyn for Pseudomonas coverage.  - Zosyn per pharmacy  - Repeat blood cultures are pending, no growth 3 days

## 2022-12-03 NOTE — Progress Notes (Signed)
   12/03/22 1345  Vitals  Temp (!) 97.5 F (36.4 C)  Pulse Rate 82  Resp 17  BP (!) 174/109  SpO2 99 %  O2 Device Room Air  Weight 99.6 kg  Type of Weight Post-Dialysis  Post Treatment  Dialyzer Clearance Lightly streaked  Hemodialysis Intake (mL) 0 mL  Liters Processed 102  Fluid Removed (mL) 3000 mL  Tolerated HD Treatment Yes  AVG/AVF Arterial Site Held (minutes) 7 minutes  AVG/AVF Venous Site Held (minutes) 7 minutes   Received patient in bed to unit.  Alert and oriented.  Informed consent signed and in chart.   TX duration:4hr  Patient tolerated well.  Transported back to the room  Alert, without acute distress.  Hand-off given to patient's nurse.   Access used: RAVF  Access issues: none  Total UF removed: 3L Medication(s) given: Hectoral    [facility]

## 2022-12-03 NOTE — Assessment & Plan Note (Signed)
-   EKG 8/25 with Qtc 494 - caution with QT prolonging meds

## 2022-12-03 NOTE — Assessment & Plan Note (Addendum)
-   CBG this morning 162, stable - CBG, vsSSI

## 2022-12-03 NOTE — Discharge Planning (Signed)
[facility] Patient Discharge Orders - [facility] CLINIC: [facility]  Patient's name: [name] Admit/DC Dates: 11/30/2022 - 12/03/2022  DISCHARGE DIAGNOSES: LEFT leg wound -> plan is for 2 weeks of IV Ceftaz with HD.  ESRD  HD ORDER CHANGES: Heparin change: no EDW Change: yes New EDW:  99.5kg Bath Change: no  ANEMIA MANAGEMENT: Aranesp: Given: no    ESA dose for discharge: Per protocol IV Iron dose at discharge: Per protocol Transfusion: Given: no  BONE/MINERAL MEDICATIONS: Hectorol/Calcitriol change: no  Sensipar/Parsabiv change: no  ACCESS INTERVENTION/CHANGE: no Details:  RECENT LABS: Recent Labs  Lab 12/03/22 0915  HGB 9.8*  NA 131*  K 4.0  CALCIUM 9.5  PHOS 8.1*  ALBUMIN 2.5*    IV ANTIBIOTICS: YES - IV CEFTAZIDIME 2g q HD for 2 weeks - until 12/12/2022 Details:  OTHER ANTICOAGULATION: On Coumadin?: no   OTHER/APPTS/LAB ORDERS:  - Make sure he is following up with wound clinic. Likely needs vascular evaluation. 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  D/C Meds to be reconciled by nurse after every discharge.  Completed By: Ozzie Hoyle, PA-C [facility] Pager [phone]   Reviewed by: MD:______ RN_______

## 2022-12-03 NOTE — Progress Notes (Addendum)
Pt receives out-pt HD at [facility] on MWF. Will assist as needed.   Olivia Canter Renal Navigator [phone]  Addendum at 1:53 pm: D/C order noted. Contacted [facility] to advise clinic of pt's d/c today and that pt should resume care on Wednesday. Renal PA to send orders to clinic for iv abx needs.

## 2022-12-03 NOTE — Assessment & Plan Note (Signed)
>>  ASSESSMENT AND PLAN FOR TYPE 2 DIABETES MELLITUS WITH HYPERGLYCEMIA, WITH LONG-TERM CURRENT USE OF INSULIN (HCC) WRITTEN ON 12/03/2022  9:49 AM BY EVERHART, KIRSTIE, DO  - CBG this morning 162, stable - CBG, vsSSI

## 2022-12-03 NOTE — Discharge Summary (Addendum)
Family Medicine Teaching [facility] Discharge Summary  Patient name: [name] Medical record number: [id number] Date of birth: 1965/05/25 Age: 57 y.o. Gender: male Date of Admission: 11/30/2022  Date of Discharge: 12/03/22 Admitting Physician: Lorayne Bender, MD  Primary Care Provider: Vladimir Crofts, FNP Consultants: nephrology, wound care  Indication for Hospitalization: worsening, nonhealing wound LLE with Pseudomonas bacteremia  Discharge Diagnoses/Problem List:  Principal Problem for Admission: left leg wound Other Problems addressed during stay:  Principal Problem:   Leg wound, left Active Problems:   ESRD (end stage renal disease) (HCC)   Type 2 diabetes mellitus with hyperglycemia, with long-term current use of insulin (HCC)   Chronic kidney disease on chronic dialysis (HCC)   Bacteremia due to Pseudomonas   Nausea and vomiting   Prolonged QT interval  Brief Hospital Course:  [name] is a 57 y.o.male with a history of ESRD, CHF, T2DM, GERD, HTN, Afib, and nonhealing LLE wound who was admitted to the [facility] Medicine Teaching Service at [facility] for worsening LLE wound. His hospital course is detailed below:  LLE wound  Pseudomonas bacteremia Presented with LLE pain related to worsening LLE wound, afebrile but with leukocytosis.  Initially required 2 L of oxygen given some hypoxia in the ED, but was able to be discharged on room air.  Presumed to have failed oral doxycyline and IV ceftazidime treatment outpatient given progression of infection, cultures had grown Pseudomonas during last admission.  Repeat blood cultures were drawn with no growth to date at time of discharge.  Started on Zosyn here (8/23-8/26). Wound care was consulted and recommendations were made including Vashe cleansing and wet-to-dry dressings.  ID was consulted and recommended continuing ceftazidime with HD outpatient for 2 weeks.  HTN Pt continued home doses of amlodipine 10mg  daily and coreg  25mg  BID however did have multiple elevated blood pressures while he was here.  Other chronic conditions were medically managed with home medications and formulary alternatives as necessary (T2DM, ESRD on HD).  PCP Follow-up Recommendations: Recommend titration of BP meds - could consider adding Losartan Caution with QT prolonging medications - had prolonged Qtc while in the hospital Increased Renvela to 800mg  TID. Previously taking 2-3 tablets TID.   Disposition: home  Discharge Condition: stable  Discharge Exam:  Vitals:   12/03/22 1345 12/03/22 1505  BP: (!) 174/109 (!) 153/87  Pulse: 82 74  Resp: 17 18  Temp: (!) 97.5 F (36.4 C) 97.8 F (36.6 C)  SpO2: 99% 99%   Physical Exam: General: Awake, alert, no acute distress Cardiovascular: Regular rate and rhythm, no murmurs Respiratory: Clear to auscultation bilaterally, no increased work of breathing Abdomen: Soft, no tenderness to palpation Extremities: Left lower extremity with bandage, appears clean/dry/intact.  No apparent surrounding edema.  Right lower extremity no swelling or tenderness.  Left AV fistula with good thrill palpable, no erythema, warmth.  Significant Procedures: received HD 8/26   Significant Labs and Imaging:  Recent Labs  Lab 12/03/22 0915  WBC 7.9  HGB 9.8*  HCT 30.3*  PLT 469*   Recent Labs  Lab 12/02/22 0554 12/03/22 0915  NA 134* 131*  K 4.2 4.0  CL 92* 89*  CO2 24 26  GLUCOSE 129* 178*  BUN 36* 46*  CREATININE 9.46* 11.55*  CALCIUM 9.5 9.5  PHOS 7.5* 8.1*  ALBUMIN 2.7* 2.5*    Blood cultures with no growth at 3 days XR tib/fib 2 view IMPRESSION: No fracture or dislocation of the left tibia or fibula. Soft  tissue edema anteriorly.  CXR IMPRESSION: Enlarged cardiopericardial silhouette with some vascular congestion.  Results/Tests Pending at Time of Discharge: Hep B surface antibody  Discharge Medications:  Allergies as of 12/03/2022   No Known Allergies       Medication List     STOP taking these medications    BD Pen Needle Nano U/F 32G X 4 MM Misc Generic drug: Insulin Pen Needle   doxycycline 100 MG capsule Commonly known as: VIBRAMYCIN   HumaLOG Mix 75/25 KwikPen (75-25) 100 UNIT/ML KwikPen Generic drug: Insulin Lispro Prot & Lispro   ondansetron 4 MG disintegrating tablet Commonly known as: ZOFRAN-ODT   oxyCODONE 5 MG immediate release tablet Commonly known as: Roxicodone       TAKE these medications    (feeding supplement) PROSource Plus liquid Take 30 mLs by mouth 2 (two) times daily between meals.   feeding supplement (NEPRO CARB STEADY) Liqd Take 237 mLs by mouth 2 (two) times daily between meals.   Accu-Chek Softclix Lancets lancets Use as directed.  For insulin-dependent type 2 diabetes Monitor  glucose 3 times a day What changed:  how much to take how to take this when to take this   acetaminophen 325 MG tablet Commonly known as: TYLENOL Take 2 tablets (650 mg total) by mouth every 6 (six) hours.   albuterol (2.5 MG/3ML) 0.083% nebulizer solution Commonly known as: PROVENTIL Take 3 mLs (2.5 mg total) by nebulization every 6 (six) hours as needed for wheezing or shortness of breath.   albuterol 108 (90 Base) MCG/ACT inhaler Commonly known as: Ventolin HFA Inhale 2 puffs into the lungs every 6 (six) hours as needed for wheezing or shortness of breath.   amLODipine 10 MG tablet Commonly known as: NORVASC Take 1 tablet (10 mg total) by mouth daily.   calcitRIOL 0.25 MCG capsule Commonly known as: ROCALTROL Take 1 capsule (0.25 mcg total) by mouth every Monday, Wednesday, and Friday.   camphor-menthol lotion Commonly known as: SARNA Apply topically 2 (two) times daily.   carvedilol 25 MG tablet Commonly known as: COREG Take 1 tablet (25 mg total) by mouth 2 (two) times daily with a meal.   gabapentin 600 MG tablet Commonly known as: NEURONTIN Take 600 mg by mouth daily. What changed: Another  medication with the same name was removed. Continue taking this medication, and follow the directions you see here.   glucose blood test strip Use as instructed For insulin-dependent type 2 diabetes Monitor  glucose 3 times a day What changed:  how much to take how to take this when to take this   levocetirizine 5 MG tablet Commonly known as: XYZAL Take 1 tablet (5 mg total) by mouth every evening.   lidocaine 2 % jelly Commonly known as: XYLOCAINE Apply 1 Application topically daily.   multivitamin Tabs tablet Take 1 tablet by mouth at bedtime.   sevelamer carbonate 800 MG tablet Commonly known as: RENVELA Take 3 tablets (2,400 mg total) by mouth 3 (three) times daily with meals. What changed: how much to take   traMADol 50 MG tablet Commonly known as: ULTRAM Take 1 tablet (50 mg total) by mouth 2 (two) times daily as needed for up to 5 days for moderate pain.   Vitamin D (Ergocalciferol) 1.25 MG (50000 UNIT) Caps capsule Commonly known as: DRISDOL Take 50,000 Units by mouth See admin instructions. Take one tablet by mouth on Monday, Wednesdays and Fridays per patient       Discharge Instructions: Please refer  to Patient Instructions section of EMR for full details.  Patient was counseled important signs and symptoms that should prompt return to medical care, changes in medications, dietary instructions, activity restrictions, and follow up appointments.   Follow-Up Appointments: N/A  Para March, DO 12/03/2022, 1:34 PM PGY-1, [facility]   I was personally present and performed medical decision making activities of this service and have verified that the service and findings are accurately documented in the resident's note.  Shelby Mattocks, DO                  12/03/2022, 5:03 PM

## 2022-12-03 NOTE — Progress Notes (Signed)
DISCHARGE NOTE HOME Guerino Racette to be discharged Home per MD order. Diagnosis, treatment, follow up, and medications list discussed in detail. Patient verbalized understanding. Patient given wound care supplies including Vashe and educated.   Skin clean, dry and intact without evidence of skin break down, no evidence of skin tears noted. IV catheter discontinued intact.   An After Visit Summary (AVS) was printed and given to the patient. Patient escorted via wheelchair, and discharged home via mother in private auto.  Tresa Endo, RN

## 2022-12-03 NOTE — Discharge Instructions (Addendum)
Dear Tiburcio Bash Bolin,   Thank you so much for allowing [place] to be part of your care!  You were admitted to [facility] for worsening infection of your left leg wound.  We treated you with a different antibiotic and some pain medicines.   DISCHARGE MEDICATION CHANGES You will receive an antibiotic (ceftazidime) in your IV at dialysis. Take Tramadol that you have at home as needed for pain in your leg, you can take 50mg  twice a day.  Take Renvela 800mg  3 times a day for your diabetes that you already have at home.  POST-HOSPITAL & CARE INSTRUCTIONS  Please let PCP/Specialists know of any changes that were made.  Please see medications section of this packet for any medication changes.   DOCTOR'S APPOINTMENT & FOLLOW UP CARE INSTRUCTIONS  Future Appointments  Date Time Provider Department Center  12/06/2022  8:45 AM Camelia Phenes, DO [facility]   RETURN PRECAUTIONS: If your wound becomes swollen, more inflamed, or you notice significant drainage from the wound If you begin having chills and fevers  Take care and be well!  Family Medicine Teaching Service Inpatient Team [facility]  [address] [place], [place] [address] [phone]

## 2022-12-03 NOTE — Assessment & Plan Note (Addendum)
Admitted for poorly healing wound in his left lower extremity.  Poor healing suspected to be complicated by his history of diabetes.  Presumed to have failed outpatient Ceftaz treatment, and was found to have Pseudomonas bacteremia.  Currently being treated with Zosyn for Pseudomonas coverage.  Inpatient blood cultures are currently pending.  Wound care was consulted who recommend nurse managing wound using pure hypochlorous acid (Vashe, Lawson # [phone]) as a cleanser and as a moisture retentive wound contact layer.  -WOC consulted , appreciate assistance -Continue Zosyn managed by pharmacy -Tylenol q6hrs scheduled -oxycodone 5mg  q4hr PRN -follow up repeat blood cultures - no growth 3 days -RD consult for wound healing

## 2022-12-04 ENCOUNTER — Telehealth (HOSPITAL_COMMUNITY): Payer: Self-pay | Admitting: Nephrology

## 2022-12-04 LAB — HEPATITIS B SURFACE ANTIBODY, QUANTITATIVE: Hep B S AB Quant (Post): 1479 m[IU]/mL

## 2022-12-04 NOTE — Telephone Encounter (Signed)
Transition of care contact from inpatient facility  Date of Discharge: 12/03/22 Date of Contact: 12/04/22 -- attempted Method of contact: Phone  Attempted to contact patient to discuss transition of care from inpatient admission. Patient did not answer the phone. Message was left on the patient's voicemail with call back number [phone].  Ozzie Hoyle, PA-C [employer] Pager [phone]

## 2022-12-05 LAB — CULTURE, BLOOD (ROUTINE X 2)
Culture: NO GROWTH
Culture: NO GROWTH
Special Requests: ADEQUATE

## 2022-12-06 ENCOUNTER — Encounter (HOSPITAL_BASED_OUTPATIENT_CLINIC_OR_DEPARTMENT_OTHER): Payer: Medicare Other | Attending: Internal Medicine | Admitting: Internal Medicine

## 2022-12-06 DIAGNOSIS — E11622 Type 2 diabetes mellitus with other skin ulcer: Secondary | ICD-10-CM | POA: Diagnosis not present

## 2022-12-06 DIAGNOSIS — G4730 Sleep apnea, unspecified: Secondary | ICD-10-CM | POA: Diagnosis not present

## 2022-12-06 DIAGNOSIS — I872 Venous insufficiency (chronic) (peripheral): Secondary | ICD-10-CM | POA: Diagnosis not present

## 2022-12-06 DIAGNOSIS — I890 Lymphedema, not elsewhere classified: Secondary | ICD-10-CM | POA: Diagnosis not present

## 2022-12-06 DIAGNOSIS — I87312 Chronic venous hypertension (idiopathic) with ulcer of left lower extremity: Secondary | ICD-10-CM | POA: Diagnosis not present

## 2022-12-06 DIAGNOSIS — I4891 Unspecified atrial fibrillation: Secondary | ICD-10-CM | POA: Diagnosis not present

## 2022-12-06 DIAGNOSIS — E1122 Type 2 diabetes mellitus with diabetic chronic kidney disease: Secondary | ICD-10-CM | POA: Diagnosis present

## 2022-12-06 DIAGNOSIS — N186 End stage renal disease: Secondary | ICD-10-CM | POA: Diagnosis not present

## 2022-12-06 DIAGNOSIS — E1151 Type 2 diabetes mellitus with diabetic peripheral angiopathy without gangrene: Secondary | ICD-10-CM | POA: Diagnosis not present

## 2022-12-06 DIAGNOSIS — I132 Hypertensive heart and chronic kidney disease with heart failure and with stage 5 chronic kidney disease, or end stage renal disease: Secondary | ICD-10-CM | POA: Diagnosis not present

## 2022-12-06 DIAGNOSIS — L97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verity: Secondary | ICD-10-CM | POA: Diagnosis not present

## 2022-12-06 DIAGNOSIS — E1142 Type 2 diabetes mellitus with diabetic polyneuropathy: Secondary | ICD-10-CM | POA: Diagnosis not present

## 2022-12-06 DIAGNOSIS — I5032 Chronic diastolic (congestive) heart failure: Secondary | ICD-10-CM | POA: Diagnosis not present

## 2022-12-06 DIAGNOSIS — R0902 Hypoxemia: Secondary | ICD-10-CM | POA: Diagnosis not present

## 2022-12-06 DIAGNOSIS — Z992 Dependence on renal dialysis: Secondary | ICD-10-CM | POA: Diagnosis not present

## 2022-12-06 NOTE — Progress Notes (Signed)
[name], [name] ([id number]) [id number] Page 1 of 12 Visit Report for 12/06/2022 Chief Complaint Document Details Patient Name: Date of Service: [name] 12/06/2022 8:45 A M Medical Record Number: [id number] Patient Account Number: [id number] Date of Birth/Sex: Treating RN: 04-Aug-1965 (57 y.o. M) Primary Care Provider: Baruch [name] Other Clinician: Referring Provider: Treating Provider/Extender: [name] Weeks in Treatment: 6 Information Obtained from: Patient Chief Complaint 08/04/2019; patient is here for review of the wound on the right lateral lower leg 10/25/2022; patient arrives in clinic today for review of a wound on the left anterior mid tibia area that has been present for approximately 2 months Electronic Signature(s) Signed: 12/06/2022 4:50:14 PM By: [name] Corwin DO Entered By: [name] on 12/06/2022 09:50:50 -------------------------------------------------------------------------------- Debridement Details Patient Name: Date of Service: [name] 12/06/2022 8:45 A M Medical Record Number: [id number] Patient Account Number: [id number] Date of Birth/Sex: Treating RN: 1966-03-23 (57 y.o. [name] Primary Care Provider: Baruch [name] Other Clinician: Referring Provider: Treating Provider/Extender: [name] Weeks in Treatment: 6 Debridement Performed for Assessment: Wound #4 Left,Anterior Lower Leg Performed By: Physician [name] Corwin, DO Debridement Type: Debridement Severity of Tissue Pre Debridement: Fat layer exposed Level of Consciousness (Pre-procedure): Awake and Alert Pre-procedure Verification/Time Out Yes - 09:25 Taken: Start Time: 09:28 Pain Control: Lidocaine 5% topical ointment Percent of Wound Bed Debrided: 100% T Area Debrided (cm): otal 26.85 Tissue and other material debrided: Non-Viable, Slough, Subcutaneous, Slough Level: Skin/Subcutaneous  Tissue Debridement Description: Excisional Instrument: Curette Bleeding: Minimum Hemostasis Achieved: Pressure Response to Treatment: Procedure was tolerated well Level of Consciousness (Post- Awake and Alert procedure): Post Debridement Measurements of Total Wound Length: (cm) 9 Width: (cm) 3.8 Depth: (cm) 0.1 Volume: (cm) 2.686 Character of Wound/Ulcer Post Debridement: Improved Severity of Tissue Post Debridement: Fat layer exposed [name] ([id number]) [id number] Page 2 of 12 Post Procedure Diagnosis Same as Pre-procedure Electronic Signature(s) Signed: 12/06/2022 4:50:14 PM By: [name] Corwin DO Signed: 12/06/2022 4:52:01 PM By: Redmond Pulling RN, BSN Entered By: Redmond Pulling on 12/06/2022 09:31:35 -------------------------------------------------------------------------------- HPI Details Patient Name: Date of Service: [name] 12/06/2022 8:45 A M Medical Record Number: [id number] Patient Account Number: [id number] Date of Birth/Sex: Treating RN: Mar 26, 1966 (57 y.o. M) Primary Care Provider: Baruch [name] Other Clinician: Referring Provider: Treating Provider/Extender: [name] Weeks in Treatment: 6 History of Present Illness HPI Description: ADMISSION 08/04/2019 This is a 57 year old man who traumatized his right lateral lower leg while going up an escalator at the mall sometime in December 2020. He was referred to vascular surgery and on 04/14/2019 he saw Dr. Raynelle Fanning. He was felt to have "palpable pulses and normal noninvasive studies" although I cannot see these results. He was back in his primary doctor's office on 07/30/2019 for a nonhealing right lower extremity wound. He was referred to wound care I think in [facility] but his insurance was out of network. A CNS was ordered. X-ray was done that was negative. He has been using wound cleanser and a Band-Aid. Past medical history is actually quite extensive  and includes chronic kidney disease stage IV, chronic diastolic heart failure, atrial fibrillation, poorly controlled hypertension, poorly controlled diabetes with peripheral neuropathy and a recent hemoglobin A1c of 11.5, depression, lymphedema and sleep apnea ABI in our clinic was 1.1 in the right 5/6; this wound was initially traumatized while the patient was going down on an escalator. Arrived in clinic last week with a completely nonviable surface. He  required mechanical debridement and we have been using Iodoflex under compression. Apparently the compression wraps fell down. He also has poorly controlled diabetes 5/15; traumatic/contusion wound to the right lateral lower leg. We have been using Iodoflex to clean at the surface of this which was completely nonviable along with mechanical debridements. He is under compression. 09/02/2019 upon evaluation today patient appears to be doing well with regard to his wound on his right lateral leg. He has been tolerating the dressing changes without complication. Fortunately there is no signs of active infection at this time. No fevers, chills, nausea, vomiting, or diarrhea. With that being said there is some necrotic tissue around the edges of the wound that is and require some sharp debridement today. That was discussed with the patient and his mother in the office today prior to debridement to which she did consent. 09/09/2019 upon evaluation today patient appears to be making progress in regard to his wound. Fortunately the wound is not nearly as deep as what it was. It is measuring a little bit larger due to having to clear some of the edges away but again that is necessary to get this to heal. Fortunately there is no evidence of active infection at this time. No fevers, chills, nausea, vomiting, or diarrhea. 09/16/2019 on evaluation today patient appears to be doing excellent in regard to his wound currently. In fact I do not see anything that we can  need to debride today which is great news. The wound bed looks healthy and the edges of the wound look like they are doing great. Overall I feel like we are at a good place and headed in the proper direction. 09/23/2019 upon evaluation today patient appears to be doing okay in regard to his wound. This is not measuring any smaller it is about the same. With that being said he tells me that his wrap actually slid down he ended up having to take this off he was on a trip and did not have his Velcro compression with him. With that being said I do not see any signs of anything significantly worsening which is good news. 09/30/2019 upon evaluation today patient appears to be doing a little bit better in regard to his leg ulcer. He has been tolerating the dressing changes without complication. Fortunately there is no signs of active infection at this time. No fevers, chills, nausea, vomiting, or diarrhea. 10/14/2019 upon evaluation today patient's wound appears to be healthier. Overall but unfortunately is measuring a little bit bigger. With that being said I do think that we do need to still continue to wrap his leg. Fortunately there is no signs of active infection at this time which is great news. 11/18/2019 upon evaluation today patient actually appears to be doing decently well with regard to his wound. He has been tolerating the dressing changes without complication. Fortunately there is no signs of active infection has been using just Neosporin at this point since he was in the hospital. He did have a venous Doppler study in the hospital though I cannot see the results that was from November 04, 2019. Nonetheless overall I am pleased with the fact the patient does seem to be doing better. 11/25/2019 on evaluation today patient appears to be doing well with regard to his wounds. He has been tolerating the dressing changes without complication. Fortunately there is no signs of active infection at this time. No  fevers, chills, nausea, vomiting, or diarrhea. I do believe that he does not  appear to be showing any signs of worsening although he also seems to be having some trouble getting this to proceed in a good fashion towards healing. 12/02/2019 on evaluation today patient actually appears to be doing not quite as well in regard to his wounds. Both have some slough noted on the surface of the wound. Unfortunately he is continuing to have some issues here with pain as well in the anterior portion which I think is an issue. Fortunately there is no signs of active infection at this time which is great news. No fevers, chills, nausea, vomiting, or diarrhea. 12/30/2019 upon evaluation today patient unfortunately has not been seen in the past month we are just now seeing him back for reevaluation today last time I [name], [name] ([id number]) [phone].pdf Page 3 of 12 saw him was August 25. At that point he had a lateral wound on his leg and an anterior wound which which is starting. The lateral has healed unfortunately the anterior looks worse. With that being said the patient unfortunately seems to be having some issues today that I cannot completely explain by just the way the wound appears. The wound itself is not really hot to touch and to be honest I would not necessarily assume infection upon initial inspection. With that being said he is acting very lethargic, was weak walking into the clinic according to his mother who is with him today, his blood pressure is actually in a normal range at around 110/73 which is unusual for him he normally runs extremely high in the 180s for systolic. His blood glucose was 227 here in the clinic today respiratory rate was 18 and his temperature was 97.7. Pulse was right around 70 when I checked this manually. Nonetheless although his vital signs and normal circumstances would be reassuring I am still kind of concerned just because of the way he is  acting. I did question him about medications that he has taken he told me that he took 2 Tylenol and tramadol this morning he also tells me he took 2 tramadol last night. With that being said I do not believe that just taking the tramadol is accounting for what is going on as well based on what I see today. I discussed all this with his mother as well as the patient face-to-face in the office today and subsequently I think I would recommend further evaluation at the ER as I really feel like there is something going on here that I cannot identify just here in the clinic. 01/13/2020 upon evaluation today patient presents for follow-up concerning ongoing issues with his right leg ulcer. After I last saw him 2 weeks ago he was actually sent to the ER where subsequently he ended up being placed on dialysis. He is doing better although he still seems very tired I think still in general he is doing much better. Fortunately there is no signs of active infection at this time. No fevers, chills, nausea, vomiting, or diarrhea. 01/26/2020; this is a patient with a large irregular albeit superficial wound on the right anterior lower leg. He has been using Santyl. He has recently been initiated on dialysis after we sent him to the ER earlier this month. He describes the wound is being very painful. By his description this started as a small raised area that rapidly expanded. He does not have a lot of obvious venous issues. Looking back through the records this was described as initially traumatic in April. He has seen vascular surgery  and not felt to have an arterial issue 02/03/2020 upon evaluation today patient actually appears to be doing a little better in regard to his ulcer although it has spread inferiorly the central portion of the wound and towards the upper has actually new skin growth. Fortunately there is no signs of active infection at this time which is great news. 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 With that being said some of the more significant issues here such as angiosarcoma does not appear to be likely based on pathology report. It seems that were more in the arterial or possible calciphylaxis realm based on what I am seeing visually. 02/10/2020 on evaluation today patient actually making some progress in regard to his leg ulcer. There is a lot of new skin growth which is excellent he tells me he still having a lot of pain but in general this seems to be showing signs of dramatic improvement which is great news. Overall I think that he is continuing to tolerate the Santyl along with ABD pad and roll gauze to secure. 02/17/2020 on evaluation today patient appears to be doing well in regard to his wound. He has been tolerating the dressing changes without complication. Fortunately there is no signs of active infection at this time which is great news and overall very pleased with where he stands today. 02/24/2020 upon evaluation today patient appears to be doing very well with regard to his leg ulcer. He has been tolerating the dressing changes with the Santyl without complication. Fortunately there is no signs of active infection at this time. No fevers, chills, nausea, vomiting, or diarrhea. 03/09/2020 upon evaluation today patient appears to be doing very well in regard to his ulcer. He has been tolerating the dressing changes without complication. Fortunately there is no signs of active infection at this time. Overall I think he is making great progress there is no depth to the wound and overall the patient seems to be feeling much better from  a pain perspective as well. I am very pleased on all that he is doing so well. 03/23/2020 on evaluation today patient appears to be doing well with regard to his lower extremity ulcer. This is showing signs of excellent improvement he has just a very small area on the inferior portion of the wound that is still open. He still has pain but not nearly as bad as it was. 03/29/2020; this patient I do not usually see. He has a history of an extensive wound on the right anterior tibia area he has been using Santyl and is on Ace wrap at home with an ABD over the area. He changes the dressing himself. He still has 1 small area left at the most distal part of this wound almost all the rest of it is healed with scar tissue. In looking at the overall wound area he did have a few blisters which often means excessive fluid 04/13/2020 on evaluation today patient appears to be doing well with regard to his leg in fact this appears to be completely healed which is great news. Fortunately there is no signs of active infection at this time which is also great news. No fevers, chills, nausea, vomiting, or diarrhea. READMISSION 10/25/2022 This  is a 58 year old man that we had extensively in the clinic from April 2021 to January 2022. He had a traumatic wound on the right anterior lower leg in the setting of type 2 diabetes chronic venous insufficiency and lymphedema. His ABIs on this assessment were 1.1. At that point he had stage IV chronic renal failure he has since gone on to start dialysis. The wound we are currently looking at is a wound on the left anterior lower leg. The patient says this started sometime in May when he hit the leg on a bed frame. Not clear exactly what they are doing for this currently but they were instructed by their primary doctor to use antibacterial soap and topical antibiotics. He is also on doxycycline. The wound is not healing and they are here for a review of this. The patient complains of  pain in the area but I could not get a history of claudication. He arrived in our clinic walking accompanied by his mother. His O2 sat was 77% on room air. He did not have any specific complaints of shortness of breath or chest pain. He tells me he was at [facility] roughly 2 years ago for a thorough pulmonary review. As noted he is on dialysis. He is a non-smoker 7/25; patient presents for follow-up. He has not obtained his ABIs yet. We gave him the number to call vein and vascular to schedule as the order was sent last week. He has been using Iodoflex to the wound bed on the left anterior leg. He has no issues or complaints. At last clinic visit it was recommended he go to the ED due to his oxygen saturation and he was admitted and had an additional session of dialysis. He feels well today. 8/29; patient missed his to last follow-up appointment and I have not seen him in over a month. On 8/18 he visited the ED for left leg pain. At that time he was treated with doxycycline. He subsequently went back to the ED on 8/23 for worsening left lower extremity wound and was admitted and treated with IV ceftazidime and this was continued on discharged during dialysis. (Apparently he has been on IV ceftazidime prior to admission for outpatient cultures growing Pseudomonas bacteremia) Wound is larger but patient denies systemic signs of infection. He had ABIs completed that showed a normal left ABI and normal toe brachial index. It is unclear what dressing he has been using for the past month. Electronic Signature(s) Signed: 12/06/2022 4:50:14 PM By: [name] Corwin DO Entered By: [name] on 12/06/2022 09:59:58 [name], [name] ([id number]) [id number] Page 4 of 12 -------------------------------------------------------------------------------- Physical Exam Details Patient Name: Date of Service: [name] 12/06/2022 8:45 A M Medical Record Number: [id number] Patient  Account Number: [id number] Date of Birth/Sex: Treating RN: [birth year] (57 y.o. M) Primary Care Provider: Baruch [name] Other Clinician: Referring Provider: Treating Provider/Extender: [name] Weeks in Treatment: 6 Constitutional respirations regular, non-labored and within target range for patient.. Cardiovascular 2+ dorsalis pedis/posterior tibialis pulses. Psychiatric pleasant and cooperative. Notes T the left anterior mid tibia there is an open wound with nonviable tissue and granulation tissue. 2+ pitting edema to the knee. No surrounding signs of soft o tissue infection. Electronic Signature(s) Signed: 12/06/2022 4:50:14 PM By: [name] Corwin DO Entered By: [name] on 12/06/2022 10:00:41 -------------------------------------------------------------------------------- Physician Orders Details Patient Name: Date of Service: [name] 12/06/2022 8:45 A M Medical Record Number: [id number] Patient Account Number: [id number] Date of Birth/Sex: Treating RN:  February 23, 1966 (56 y.o. [name] Primary Care Provider: Baruch [name] Other Clinician: Referring Provider: Treating Provider/Extender: [name] Weeks in Treatment: 6 Verbal / Phone Orders: No Diagnosis Coding Follow-up Appointments ppointment in 2 weeks. - Dr Mikey Bussing Tuesday 12/18/22 @ 8:45am Room 7 Return A Return appointment in 3 weeks. - Dr Mikey Bussing Tuesday 12/25/22 @ 0815 Room 9 Nurse Visit: - Room 9 Tuesday 12/11/22 @ 3:00pm Anesthetic (In clinic) Topical Lidocaine 5% applied to wound bed Bathing/ Shower/ Hygiene May shower with protection but do not get wound dressing(s) wet. Protect dressing(s) with water repellant cover (for example, large plastic bag) or a cast cover and may then take shower. - may purchase cast protector from [facility], [facility] or [employer] Edema Control - Lymphedema / SCD / Other Elevate legs to the level of the heart or above for 30 minutes daily  and/or when sitting for 3-4 times a day throughout the day. Avoid standing for long periods of time. Exercise regularly Moisturize legs daily. Additional Orders / Instructions Follow Nutritious Diet - closely monitor blood sugar. increase protein levels. Wound Treatment Wound #4 - Lower Leg Wound Laterality: Left, Anterior Cleanser: Wound Cleanser (Generic) 1 x Per Week/30 Days Discharge Instructions: Cleanse the wound with wound cleanser prior to applying a clean dressing using gauze sponges, not tissue or cotton balls. Peri-Wound Care: Sween Lotion (Moisturizing lotion) 1 x Per Week/30 Days Discharge Instructions: Apply moisturizing lotion as directed Topical: Gentamicin 1 x Per Week/30 Days [name], [name] ([id number]) [id number] Page 5 of 12 Discharge Instructions: As directed by physician Topical: Mupirocin Ointment 1 x Per Week/30 Days Discharge Instructions: Apply Mupirocin (Bactroban) as instructed Prim Dressing: PolyMem Silver Non-Adhesive Dressing, 4.25x4.25 in 1 x Per Week/30 Days ary Discharge Instructions: Apply to wound bed as instructed Secondary Dressing: ABD Pad, 8x10 1 x Per Week/30 Days Discharge Instructions: Apply over primary dressing as directed. Secured With: Coban Self-Adherent Wrap 4x5 (in/yd) 1 x Per Week/30 Days Discharge Instructions: Secure with Coban as directed. Secured With: [employer], 4.5x3.1 (in/yd) 1 x Per Week/30 Days Discharge Instructions: Secure with Kerlix as directed. Electronic Signature(s) Signed: 12/06/2022 4:50:14 PM By: [name] Corwin DO Entered By: [name] on 12/06/2022 10:00:49 -------------------------------------------------------------------------------- Problem List Details Patient Name: Date of Service: [name] 12/06/2022 8:45 A M Medical Record Number: [id number] Patient Account Number: [id number] Date of Birth/Sex: Treating RN: 08-18-1965 (57 y.o. M) Primary Care Provider:  Baruch [name] Other Clinician: Referring Provider: Treating Provider/Extender: [name] Weeks in Treatment: 6 Active Problems ICD-10 Encounter Code Description Active Date MDM Diagnosis 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 10/25/2022 No Yes severity E11.622 Type 2 diabetes mellitus with other skin ulcer 10/25/2022 No Yes I87.312 Chronic venous hypertension (idiopathic) with ulcer of left lower extremity 12/06/2022 No Yes E11.22 Type 2 diabetes mellitus with diabetic chronic kidney disease 10/25/2022 No Yes R09.02 Hypoxemia 10/25/2022 No Yes Inactive Problems Resolved Problems Electronic Signature(s) Signed: 12/06/2022 4:50:14 PM By: [name] Corwin DO Entered By: [name] on 12/06/2022 10:04:08 [name], [name] ([id number]) [id number] Page 6 of 12 -------------------------------------------------------------------------------- Progress Note Details Patient Name: Date of Service: [name] 12/06/2022 8:45 A M Medical Record Number: [id number] Patient Account Number: [id number] Date of Birth/Sex: Treating RN: [birth year] (57 y.o. M) Primary Care Provider: Baruch [name] Other Clinician: Referring Provider: Treating Provider/Extender: [name] Weeks in Treatment: 6 Subjective Chief Complaint Information obtained from Patient 08/04/2019; patient is here for review of  the wound on the right lateral lower leg 10/25/2022; patient arrives in clinic today for review of a wound on the left anterior mid tibia area that has been present for approximately 2 months History of Present Illness (HPI) ADMISSION 08/04/2019 This is a 57 year old man who traumatized his right lateral lower leg while going [place] an escalator at the mall sometime in December 2020. He was referred to vascular surgery and on 04/14/2019 he saw Dr. Raynelle Fanning. He was felt to have "palpable pulses and  normal noninvasive studies" although I cannot see these results. He was back in his primary doctor's office on 07/30/2019 for a nonhealing right lower extremity wound. He was referred to wound care I think in [facility] but his insurance was out of network. A CNS was ordered. X-ray was done that was negative. He has been using wound cleanser and a Band-Aid. Past medical history is actually quite extensive and includes chronic kidney disease stage IV, chronic diastolic heart failure, atrial fibrillation, poorly controlled hypertension, poorly controlled diabetes with peripheral neuropathy and a recent hemoglobin A1c of 11.5, depression, lymphedema and sleep apnea ABI in our clinic was 1.1 in the right 5/6; this wound was initially traumatized while the patient was going down on an escalator. Arrived in clinic last week with a completely nonviable surface. He required mechanical debridement and we have been using Iodoflex under compression.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 Signature(s) Signed: 12/06/2022 4:50:14 PM By: [name] Corwin DO Entered By: [name] on 12/06/2022 10:00:05 -------------------------------------------------------------------------------- SuperBill Details Patient Name: Date of Service: [name] 12/06/2022 Medical Record Number: [id number] Patient Account Number: [id number] Date of Birth/Sex: Treating RN: 1966/03/12 (57 y.o. 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 R09.02 Hypoxemia Facility Procedures : CPT4 Code: [id number] Description: 11042 - DEB SUBQ TISSUE 20 SQ CM/< ICD-10 Diagnosis Description 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 E11.622 Type 2 diabetes mellitus with other skin ulcer I87.312 Chronic venous  hypertension (idiopathic) with ulcer of left lower extremity Modifier: severity Quantity: 1 : CPT4 Code: [id number]  Description: 11045 - DEB SUBQ TISS EA ADDL 20CM ICD-10 Diagnosis Description 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 E11.622 Type 2 diabetes mellitus with other skin ulcer I87.312 Chronic venous  hypertension (idiopathic) with ulcer of left lower extremity Modifier: severity Quantity: 1 Physician Procedures [name], [name] ([id number]): CPT4 Code Description [id number] 11042 - WC PHYS SUBQ TISS 20 SQ CM ICD-10 Diagnosis Description L97.828 Non-pressure chronic ulcer of other part of left lower leg with ot E11.622 Type 2 diabetes mellitus with other skin ulcer  I87.312 Chronic venous hypertension (idiopathic) with ulcer of left  lower [id number] Page 12 of 12: Quantity Modifier 1 her specified severity extremity [name], [name] ([id number]): [id number] 11045 - WC PHYS SUBQ TISS EA ADDL 20 CM ICD-10 Diagnosis Description L97.828 Non-pressure chronic ulcer of other part of left lower leg with ot E11.622 Type 2 diabetes mellitus with other skin ulcer I87.312 Chronic  venous hypertension (idiopathic) with ulcer of left lower [id number] Page 12 of 12: 1 her specified severity extremity Electronic Signature(s) Signed: 12/06/2022 4:50:14 PM By: [name] Corwin DO Entered By: [name] on 12/06/2022 10:04:45

## 2022-12-07 NOTE — Progress Notes (Signed)
[place], Adhvik ([id number]) [id number] Page 1 of 8 Visit Report for 12/06/2022 Arrival Information Details Patient Name: Date of Service: [name], [name] 12/06/2022 8:45 A M Medical Record Number: [id number] Patient Account Number: [id number] Date of Birth/Sex: Treating RN: 01/14/1966 (57 y.o. M) Primary Care Joley Utecht: Baruch Goldmann Other Clinician: Referring Joyanne Eddinger: Treating Donnavan Covault/Extender: Almyra Deforest Weeks in Treatment: 6 Visit Information History Since Last Visit Added or deleted any medications: No Patient Arrived: Ambulatory Any new allergies or adverse reactions: No Arrival Time: 08:47 Had a fall or experienced change in No Accompanied By: mother activities of daily living that may affect Transfer Assistance: None risk of falls: Patient Identification Verified: Yes Signs or symptoms of abuse/neglect since last visito No Secondary Verification Process Completed: Yes Hospitalized since last visit: No Patient Requires Transmission-Based Precautions: No Implantable device outside of the clinic excluding No Patient Has Alerts: Yes cellular tissue based products placed in the center Patient Alerts: ABI in clinic L [place] 7/24 since last visit: DO NOT USE LEFT ARM Pain Present Now: Yes Electronic Signature(s) Signed: 12/07/2022 12:25:24 PM By: Thayer Dallas Entered By: Thayer Dallas on 12/06/2022 05:49:23 -------------------------------------------------------------------------------- Encounter Discharge Information Details Patient Name: Date of Service: [name], [name] 12/06/2022 8:45 A M Medical Record Number: [id number] Patient Account Number: [id number] Date of Birth/Sex: Treating RN: [birth year] (57 y.o. [name] Primary Care [name]: Baruch Goldmann Other Clinician: Referring Shankar Silber: Treating Alexandr Yaworski/Extender: Almyra Deforest Weeks in Treatment: 6 Encounter Discharge Information Items Post Procedure  Vitals Discharge Condition: Stable Temperature (F): 97.9 Ambulatory Status: Ambulatory Pulse (bpm): 77 Discharge Destination: Home Respiratory Rate (breaths/min): 18 Transportation: Private Auto Blood Pressure (mmHg): 193/108 Accompanied By: mother Schedule Follow-up Appointment: Yes Clinical Summary of Care: Patient Declined Electronic Signature(s) Signed: 12/06/2022 4:52:01 PM By: Redmond Pulling RN, BSN Entered By: Redmond Pulling on 12/06/2022 06:50:35 Standard, Tiburcio Bash ([id number]) [id number] Page 2 of 8 -------------------------------------------------------------------------------- Lower Extremity Assessment Details Patient Name: Date of Service: [name], [name] 12/06/2022 8:45 A M Medical Record Number: [id number] Patient Account Number: [id number] Date of Birth/Sex: Treating RN: 1966/03/10 (56 y.o. M) Primary Care Regan Llorente: Baruch Goldmann Other Clinician: Referring Gaspar Fowle: Treating Cache Bills/Extender: Almyra Deforest Weeks in Treatment: 6 Edema Assessment Assessed: [Left: No] [Right: No] Edema: [Left: N] [Right: o] Calf Left: Right: Point of Measurement: 36 cm From Medial Instep 39.6 cm Ankle Left: Right: Point of Measurement: 12 cm From Medial Instep 22.3 cm Vascular Assessment Pulses: Dorsalis Pedis Palpable: [Left:Yes] Extremity colors, hair growth, and conditions: Extremity Color: [Left:Normal] Hair Growth on Extremity: [Left:No] Temperature of Extremity: [Left:Warm] Capillary Refill: [Left:< 3 seconds] Dependent Rubor: [Left:No No] Toe Nail Assessment Left: Right: Thick: Yes Discolored: Yes Deformed: Yes Improper Length and Hygiene: Yes Electronic Signature(s) Signed: 12/07/2022 12:25:24 PM By: Thayer Dallas Entered By: Thayer Dallas on 12/06/2022 06:00:51 -------------------------------------------------------------------------------- Multi Wound Chart Details Patient Name: Date of Service: [name], [name]  12/06/2022 8:45 A M Medical Record Number: [id number] Patient Account Number: [id number] Date of Birth/Sex: Treating RN: [birth year] (58 y.o. M) Primary Care Noell Shular: Baruch Goldmann Other Clinician: Referring Wilkin Lippy: Treating Jovon Winterhalter/Extender: Almyra Deforest Weeks in Treatment: 6 Vital Signs Height(in): Capillary Blood Glucose(mg/dl): 628 Weight(lbs): Pulse(bpm): 77 Body Mass Index(BMI): Blood Pressure(mmHg): 193/108 Lorah, Jaziel ([id number]) [id number] Page 3 of 8 Temperature(F): 97.9 Respiratory Rate(breaths/min): 20 [4:Photos:] [N/A:N/A] Left, Anterior Lower Leg N/A N/A Wound Location: Gradually Appeared N/A N/A Wounding Event: Diabetic Wound/Ulcer of the Lower N/A N/A Primary Etiology: Extremity Venous Leg Ulcer N/A N/A Secondary  Etiology: Lymphedema, Sleep Apnea, N/A N/A Comorbid History: Arrhythmia, Congestive Heart Failure, Hypertension, Peripheral Arterial Disease, Type II Diabetes, End Stage Renal Disease 08/10/2022 N/A N/A Date Acquired: 6 N/A N/A Weeks of Treatment: Open N/A N/A Wound Status: No N/A N/A Wound Recurrence: 9x3.8x0.1 N/A N/A Measurements L x W x D (cm) 26.861 N/A N/A A (cm) : rea 2.686 N/A N/A Volume (cm) : -204.00% N/A N/A % Reduction in A rea: -203.80% N/A N/A % Reduction in Volume: Grade 1 N/A N/A Classification: Medium N/A N/A Exudate A mount: Serosanguineous N/A N/A Exudate Type: red, brown N/A N/A Exudate Color: Distinct, outline attached N/A N/A Wound Margin: Small (1-33%) N/A N/A Granulation A mount: Red N/A N/A Granulation Quality: Large (67-100%) N/A N/A Necrotic A mount: Eschar, Adherent Slough N/A N/A Necrotic Tissue: Fat Layer (Subcutaneous Tissue): Yes N/A N/A Exposed Structures: Fascia: No Tendon: No Muscle: No Joint: No Bone: No None N/A N/A Epithelialization: Debridement - Excisional N/A N/A Debridement: Pre-procedure Verification/Time Out 09:25 N/A  N/A Taken: Lidocaine 5% topical ointment N/A N/A Pain Control: Subcutaneous, Slough N/A N/A Tissue Debrided: Skin/Subcutaneous Tissue N/A N/A Level: 26.85 N/A N/A Debridement A (sq cm): rea Curette N/A N/A Instrument: Minimum N/A N/A Bleeding: Pressure N/A N/A Hemostasis A chieved: Procedure was tolerated well N/A N/A Debridement Treatment Response: 9x3.8x0.1 N/A N/A Post Debridement Measurements L x W x D (cm) 2.686 N/A N/A Post Debridement Volume: (cm) Excoriation: No N/A N/A Periwound Skin Texture: Induration: No Callus: No Crepitus: No Rash: No Scarring: No Maceration: No N/A N/A Periwound Skin Moisture: Dry/Scaly: No Atrophie Blanche: No N/A N/A Periwound Skin Color: Cyanosis: No Ecchymosis: No Erythema: No Hemosiderin Staining: No Mottled: No Pallor: No Rubor: No No Abnormality N/A N/A Temperature: Debridement N/A N/A Procedures Performed: Treatment Notes Kurkowski, Jerimey ([id number]) [id number] Page 4 of 8 Wound #4 (Lower Leg) Wound Laterality: Left, Anterior Cleanser Wound Cleanser Discharge Instruction: Cleanse the wound with wound cleanser prior to applying a clean dressing using gauze sponges, not tissue or cotton balls. Peri-Wound Care Sween Lotion (Moisturizing lotion) Discharge Instruction: Apply moisturizing lotion as directed Topical Gentamicin Discharge Instruction: As directed by physician Mupirocin Ointment Discharge Instruction: Apply Mupirocin (Bactroban) as instructed Primary Dressing PolyMem Silver Non-Adhesive Dressing, 4.25x4.25 in Discharge Instruction: Apply to wound bed as instructed Secondary Dressing ABD Pad, 8x10 Discharge Instruction: Apply over primary dressing as directed. Secured With [employer] 4x5 (in/yd) Discharge Instruction: Secure with Coban as directed. Kerlix Roll Sterile, 4.5x3.1 (in/yd) Discharge Instruction: Secure with Kerlix as directed. Compression  Wrap Compression Stockings Add-Ons Electronic Signature(s) Signed: 12/06/2022 4:50:14 PM By: Geralyn Corwin DO Entered By: Geralyn Corwin on 12/06/2022 06:50:35 -------------------------------------------------------------------------------- Multi-Disciplinary Care Plan Details Patient Name: Date of Service: [name], [name] 12/06/2022 8:45 A M Medical Record Number: [id number] Patient Account Number: [id number] Date of Birth/Sex: Treating RN: [birth year] (57 y.o. [name] Primary Care Avaiah Stempel: Baruch Goldmann Other Clinician: Referring Shanikqua Zarzycki: Treating Avante Carneiro/Extender: Almyra Deforest Weeks in Treatment: 6 Active Inactive Necrotic Tissue Nursing Diagnoses: Impaired tissue integrity related to necrotic/devitalized tissue Goals: Necrotic/devitalized tissue will be minimized in the wound bed Date Initiated: 10/25/2022 Target Resolution Date: 12/21/2022 Goal Status: Active Interventions: Assess patient pain level pre-, during and post procedure and prior to discharge Vanegas, Sair ([id number]) [id number] Page 5 of 8 Treatment Activities: Enzymatic debridement : 10/25/2022 T ordered outside of clinic : 10/25/2022 est Notes: Orientation to the Wound Care Program Nursing Diagnoses: Knowledge deficit related to the wound healing center program Goals: Patient/caregiver will verbalize  understanding of the Wound Healing Center Program Date Initiated: 10/25/2022 Target Resolution Date: 12/21/2022 Goal Status: Active Interventions: Provide education on orientation to the wound center Notes: Wound/Skin Impairment Nursing Diagnoses: Knowledge deficit related to ulceration/compromised skin integrity Goals: Patient/caregiver will verbalize understanding of skin care regimen Date Initiated: 10/25/2022 Target Resolution Date: 01/04/2023 Goal Status: Active Ulcer/skin breakdown will heal within 14 weeks Date Initiated: 10/25/2022 Date  Inactivated: 12/06/2022 Target Resolution Date: 11/16/2022 Goal Status: Unmet Unmet Reason: non compliance Interventions: Assess patient/caregiver ability to perform ulcer/skin care regimen upon admission and as needed Assess ulceration(s) every visit Provide education on ulcer and skin care Treatment Activities: Skin care regimen initiated : 10/25/2022 Topical wound management initiated : 10/25/2022 Notes: Electronic Signature(s) Signed: 12/06/2022 4:52:01 PM By: Redmond Pulling RN, BSN Entered By: Redmond Pulling on 12/06/2022 06:49:10 -------------------------------------------------------------------------------- Pain Assessment Details Patient Name: Date of Service: [name], [name] 12/06/2022 8:45 A M Medical Record Number: [id number] Patient Account Number: [id number] Date of Birth/Sex: Treating RN: [birth year] (57 y.o. M) Primary Care Gevork Ayyad: Baruch Goldmann Other Clinician: Referring Correy Weidner: Treating [place] Steidle/Extender: Almyra Deforest Weeks in Treatment: 6 Active Problems Location of Pain Severity and Description of Pain Patient Has Paino Yes Site Locations Pain Location: Tessier, Yahya ([id number]) [id number] Page 6 of 8 Pain Location: Generalized Pain Rate the pain. Current Pain Level: 9 Pain Management and Medication Current Pain Management: Electronic Signature(s) Signed: 12/07/2022 12:25:24 PM By: Thayer Dallas Entered By: Thayer Dallas on 12/06/2022 05:49:37 -------------------------------------------------------------------------------- Patient/Caregiver Education Details Patient Name: Date of Service: [name], [name] 8/29/2024andnbsp8:45 A M Medical Record Number: [id number] Patient Account Number: [id number] Date of Birth/Gender: Treating RN: Feb 27, 1966 (57 y.o. [name] Primary Care Physician: Baruch Goldmann Other Clinician: Referring Physician: Treating Physician/Extender: Almyra Deforest Weeks in  Treatment: 6 Education Assessment Education Provided To: Patient Education Topics Provided Wound/Skin Impairment: Methods: Explain/Verbal Responses: State content correctly Electronic Signature(s) Signed: 12/06/2022 4:52:01 PM By: Redmond Pulling RN, BSN Entered By: Redmond Pulling on 12/06/2022 06:49:28 -------------------------------------------------------------------------------- Wound Assessment Details Patient Name: Date of Service: [name], [name] 12/06/2022 8:45 A M Medical Record Number: [id number] Patient Account Number: [id number] Date of Birth/Sex: Treating RN: [birth year] (57 y.o. M) Primary Care Yassmin Binegar: Baruch Goldmann Other Clinician: Referring Adlee Paar: Treating Mihaela Fajardo/Extender: Almyra Deforest Mcmiller, Hervey ([id number]) [id number] Page 7 of 8 Weeks in Treatment: 6 Wound Status Wound Number: 4 Primary Diabetic Wound/Ulcer of the Lower Extremity Etiology: Wound Location: Left, Anterior Lower Leg Secondary Venous Leg Ulcer Wounding Event: Gradually Appeared Etiology: Date Acquired: 08/10/2022 Wound Open Weeks Of Treatment: 6 Status: Clustered Wound: No Comorbid Lymphedema, Sleep Apnea, Arrhythmia, Congestive Heart Failure, History: Hypertension, Peripheral Arterial Disease, Type II Diabetes, End Stage Renal Disease Photos Wound Measurements Length: (cm) 9 Width: (cm) 3.8 Depth: (cm) 0.1 Area: (cm) 26.861 Volume: (cm) 2.686 % Reduction in Area: -204% % Reduction in Volume: -203.8% Epithelialization: None Tunneling: No Undermining: No Wound Description Classification: Grade 1 Wound Margin: Distinct, outline attached Exudate Amount: Medium Exudate Type: Serosanguineous Exudate Color: red, brown Foul Odor After Cleansing: No Slough/Fibrino No Wound Bed Granulation Amount: Small (1-33%) Exposed Structure Granulation Quality: Red Fascia Exposed: No Necrotic Amount: Large (67-100%) Fat Layer (Subcutaneous Tissue)  Exposed: Yes Necrotic Quality: Eschar, Adherent Slough Tendon Exposed: No Muscle Exposed: No Joint Exposed: No Bone Exposed: No Periwound Skin Texture Texture Color No Abnormalities Noted: No No Abnormalities Noted: No Callus: No Atrophie Blanche: No Crepitus: No Cyanosis: No Excoriation: No Ecchymosis: No Induration: No Erythema: No Rash: No  Hemosiderin Staining: No Scarring: No Mottled: No Pallor: No Moisture Rubor: No No Abnormalities Noted: No Dry / Scaly: No Temperature / Pain Maceration: No Temperature: No Abnormality Treatment Notes Wound #4 (Lower Leg) Wound Laterality: Left, Anterior Cleanser Wound Cleanser Discharge Instruction: Cleanse the wound with wound cleanser prior to applying a clean dressing using gauze sponges, not tissue or cotton balls. Peri-Wound Care Sween Lotion (Moisturizing lotion) Kozloski, Tracey ([id number]) [id number] Page 8 of 8 Discharge Instruction: Apply moisturizing lotion as directed Topical Gentamicin Discharge Instruction: As directed by physician Mupirocin Ointment Discharge Instruction: Apply Mupirocin (Bactroban) as instructed Primary Dressing PolyMem Silver Non-Adhesive Dressing, 4.25x4.25 in Discharge Instruction: Apply to wound bed as instructed Secondary Dressing ABD Pad, 8x10 Discharge Instruction: Apply over primary dressing as directed. Secured With [employer] 4x5 (in/yd) Discharge Instruction: Secure with Coban as directed. Kerlix Roll Sterile, 4.5x3.1 (in/yd) Discharge Instruction: Secure with Kerlix as directed. Compression Wrap Compression Stockings Add-Ons Electronic Signature(s) Signed: 12/07/2022 12:25:24 PM By: Thayer Dallas Entered By: Thayer Dallas on 12/06/2022 06:03:22 -------------------------------------------------------------------------------- Vitals Details Patient Name: Date of Service: [name], [name] 12/06/2022 8:45 A M Medical Record Number:  [id number] Patient Account Number: [id number] Date of Birth/Sex: Treating RN: Aug 16, 1965 (57 y.o. M) Primary Care Kysen Wetherington: Baruch Goldmann Other Clinician: Referring Emya Picado: Treating Josey Dettmann/Extender: Almyra Deforest Weeks in Treatment: 6 Vital Signs Time Taken: 08:55 Temperature (F): 97.9 Pulse (bpm): 77 Respiratory Rate (breaths/min): 20 Blood Pressure (mmHg): 193/108 Capillary Blood Glucose (mg/dl): 578 Reference Range: 80 - 120 mg / dl Electronic Signature(s) Signed: 12/07/2022 12:25:24 PM By: Thayer Dallas Entered By: Thayer Dallas on 12/06/2022 05:56:25

## 2022-12-11 ENCOUNTER — Encounter (HOSPITAL_BASED_OUTPATIENT_CLINIC_OR_DEPARTMENT_OTHER): Payer: Medicare Other | Attending: Internal Medicine | Admitting: Internal Medicine

## 2022-12-11 DIAGNOSIS — I5032 Chronic diastolic (congestive) heart failure: Secondary | ICD-10-CM | POA: Diagnosis not present

## 2022-12-11 DIAGNOSIS — G4730 Sleep apnea, unspecified: Secondary | ICD-10-CM | POA: Insufficient documentation

## 2022-12-11 DIAGNOSIS — I890 Lymphedema, not elsewhere classified: Secondary | ICD-10-CM | POA: Diagnosis not present

## 2022-12-11 DIAGNOSIS — I87312 Chronic venous hypertension (idiopathic) with ulcer of left lower extremity: Secondary | ICD-10-CM | POA: Insufficient documentation

## 2022-12-11 DIAGNOSIS — N186 End stage renal disease: Secondary | ICD-10-CM | POA: Insufficient documentation

## 2022-12-11 DIAGNOSIS — E1142 Type 2 diabetes mellitus with diabetic polyneuropathy: Secondary | ICD-10-CM | POA: Insufficient documentation

## 2022-12-11 DIAGNOSIS — Z992 Dependence on renal dialysis: Secondary | ICD-10-CM | POA: Diagnosis not present

## 2022-12-11 DIAGNOSIS — L97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verity: Secondary | ICD-10-CM | POA: Diagnosis not present

## 2022-12-11 DIAGNOSIS — E1122 Type 2 diabetes mellitus with diabetic chronic kidney disease: Secondary | ICD-10-CM | POA: Insufficient documentation

## 2022-12-11 DIAGNOSIS — I132 Hypertensive heart and chronic kidney disease with heart failure and with stage 5 chronic kidney disease, or end stage renal disease: Secondary | ICD-10-CM | POA: Diagnosis not present

## 2022-12-11 DIAGNOSIS — R0902 Hypoxemia: Secondary | ICD-10-CM | POA: Diagnosis not present

## 2022-12-11 DIAGNOSIS — E11622 Type 2 diabetes mellitus with other skin ulcer: Secondary | ICD-10-CM | POA: Diagnosis present

## 2022-12-11 NOTE — Progress Notes (Signed)
[name] [name] ([id number]) [id number] Page 1 of 5 Visit Report for 12/11/2022 Arrival Information Details Patient Name: Date of Service: [name] [name] 12/11/2022 3:00 PM Medical Record Number: [id number] Patient Account Number: [id number] Date of Birth/Sex: Treating RN: [birth year] (57 y.o. M) Primary Care [name] [name]: [name] [name] Other Clinician: Referring [name] [name]: Treating [name] [name]/Extender: [name] [name] Weeks in Treatment: 6 Visit Information History Since Last Visit Added or deleted any medications: No Patient Arrived: Ambulatory Any new allergies or adverse reactions: No Arrival Time: 15:00 Had a fall or experienced change in No Accompanied By: self activities of daily living that may affect Transfer Assistance: None risk of falls: Patient Identification Verified: Yes Signs or symptoms of abuse/neglect since last visito No Secondary Verification Process Completed: Yes Hospitalized since last visit: No Patient Requires Transmission-Based No Implantable device outside of the clinic excluding No Precautions: cellular tissue based products placed in the center Patient Has Alerts: Yes since last visit: Patient Alerts: ABI in clinic [name] [name] 7/24 Has Dressing in Place as Prescribed: No DO NOT USE LEFT ARM Pain Present Now: Yes Electronic Signature(s) Signed: 12/11/2022 5:00:52 PM By: [name] [name] Entered By: [name] [name] on 12/11/2022 12:12:30 -------------------------------------------------------------------------------- Clinic Level of Care Assessment Details Patient Name: Date of Service: [name] [name] 12/11/2022 3:00 PM Medical Record Number: [id number] Patient Account Number: [id number] Date of Birth/Sex: Treating RN: [birth year] (57 y.o. M) Primary Care [name] [name]: [name] [name] Other Clinician: Referring [name] [name]: Treating Kaliope [name]/Extender: [name] [name] Weeks in Treatment: 6 Clinic Level of Care Assessment  Items TOOL 4 Quantity Score X- 1 0 Use when only an EandM is performed on FOLLOW-UP visit ASSESSMENTS - Nursing Assessment / Reassessment X- 1 10 Reassessment of [name]-morbidities (includes updates in patient status) X- 1 5 Reassessment of Adherence to Treatment Plan ASSESSMENTS - Wound and Skin A ssessment / Reassessment []  - 0 Simple Wound Assessment / Reassessment - one wound []  - 0 Complex Wound Assessment / Reassessment - multiple wounds []  - 0 Dermatologic / Skin Assessment (not related to wound area) ASSESSMENTS - Focused Assessment []  - 0 Circumferential Edema Measurements - multi extremities []  - 0 Nutritional Assessment / Counseling / Intervention [name] [name] ([id number]) [id number] Page 2 of 5 []  - 0 Lower Extremity Assessment (monofilament, tuning fork, pulses) []  - 0 Peripheral Arterial Disease Assessment (using hand held doppler) ASSESSMENTS - Ostomy and/or Continence Assessment and Care []  - 0 Incontinence Assessment and Management []  - 0 Ostomy Care Assessment and Management (repouching, etc.) PROCESS - Coordination of Care []  - 0 Simple Patient / Family Education for ongoing care X- 1 20 Complex (extensive) Patient / Family Education for ongoing care []  - 0 Staff obtains Chiropractor, Records, T Results / Process Orders est []  - 0 Staff telephones HHA, Nursing Homes / Clarify orders / etc []  - 0 Routine Transfer to another Facility (non-emergent condition) []  - 0 Routine Hospital Admission (non-emergent condition) []  - 0 New Admissions / Manufacturing engineer / Ordering NPWT Apligraf, etc. , []  - 0 Emergency Hospital Admission (emergent condition) X- 1 10 Simple Discharge Coordination []  - 0 Complex (extensive) Discharge Coordination PROCESS - Special Needs []  - 0 Pediatric / Minor Patient Management []  - 0 Isolation Patient Management []  - 0 Hearing / Language / Visual special needs []  - 0 Assessment of Community  assistance (transportation, D/C planning, etc.) []  - 0 Additional assistance / Altered mentation []  - 0 Support Surface(s) Assessment (bed, cushion, seat, etc.) INTERVENTIONS - Wound Cleansing / Measurement X -  Simple Wound Cleansing - one wound 1 5 []  - 0 Complex Wound Cleansing - multiple wounds []  - 0 Wound Imaging (photographs - any number of wounds) []  - 0 Wound Tracing (instead of photographs) []  - 0 Simple Wound Measurement - one wound []  - 0 Complex Wound Measurement - multiple wounds INTERVENTIONS - Wound Dressings []  - 0 Small Wound Dressing one or multiple wounds []  - 0 Medium Wound Dressing one or multiple wounds X- 1 20 Large Wound Dressing one or multiple wounds X- 1 5 Application of Medications - topical []  - 0 Application of Medications - injection INTERVENTIONS - Miscellaneous []  - 0 External ear exam []  - 0 Specimen Collection (cultures, biopsies, blood, body fluids, etc.) []  - 0 Specimen(s) / Culture(s) sent or taken to Lab for analysis []  - 0 Patient Transfer (multiple staff / Nurse, adult / Similar devices) []  - 0 Simple Staple / Suture removal (25 or less) []  - 0 Complex Staple / Suture removal (26 or more) []  - 0 Hypo / Hyperglycemic Management (close monitor of Blood Glucose) [name] [name] ([id number]) [id number] Page 3 of 5 []  - 0 Ankle / Brachial Index (ABI) - do not check if billed separately []  - 0 Vital Signs Has the patient been seen at the hospital within the last three years: Yes Total Score: 75 Level Of Care: New/Established - Level 2 Electronic Signature(s) Signed: 12/11/2022 5:00:52 PM By: [name] [name] Entered By: [name] [name] on 12/11/2022 12:32:34 -------------------------------------------------------------------------------- Encounter Discharge Information Details Patient Name: Date of Service: [name] [name] 12/11/2022 3:00 PM Medical Record Number: [id number] Patient Account Number:  [id number] Date of Birth/Sex: Treating RN: [birth year] (57 y.o. M) Primary Care [name] [name]: [name] [name] Other Clinician: Thayer [name] Referring [name] [name]: Treating [name] [name]/Extender: [name] [name] Weeks in Treatment: 6 Encounter Discharge Information Items Discharge Condition: Stable Ambulatory Status: Ambulatory Discharge Destination: Home Transportation: Private Auto Accompanied By: self Schedule Follow-up Appointment: Yes Clinical Summary of Care: Electronic Signature(s) Signed: 12/11/2022 5:00:52 PM By: [name] [name] Entered By: [name] [name] on 12/11/2022 12:37:36 -------------------------------------------------------------------------------- Patient/Caregiver Education Details Patient Name: Date of Service: [name] [name] 9/3/2024andnbsp3:00 PM Medical Record Number: [id number] Patient Account Number: [id number] Date of Birth/Gender: Treating RN: 1965/11/08 (57 y.o. M) Primary Care Physician: [name] [name] Other Clinician: Thayer [name] Referring Physician: Treating Physician/Extender: [name] [name] Weeks in Treatment: 6 Education Assessment Education Provided To: Patient Education Topics Provided Electronic Signature(s) Signed: 12/11/2022 5:00:52 PM By: [name] [name] Entered By: [name] [name] on 12/11/2022 12:36:38 [name] [name] Bash ([id number]) [id number] Page 4 of 5 -------------------------------------------------------------------------------- Wound Assessment Details Patient Name: Date of Service: [name] [name] 12/11/2022 3:00 PM Medical Record Number: [id number] Patient Account Number: [id number] Date of Birth/Sex: Treating RN: [birth year] (57 y.o. M) Primary Care Skyra Crichlow: [name] [name] Other Clinician: Referring Marsheila Alejo: Treating Rudie Rikard/Extender: [name] [name] Weeks in Treatment: 6 Wound Status Wound Number: 4 Primary Etiology: Diabetic Wound/Ulcer of the Lower Extremity Wound  Location: Left, Anterior Lower Leg Secondary Etiology: Venous Leg Ulcer Wounding Event: Gradually Appeared Wound Status: Open Date Acquired: 08/10/2022 Weeks Of Treatment: 6 Clustered Wound: No Wound Measurements Length: (cm) 9 Width: (cm) 3.8 Depth: (cm) 0.1 Area: (cm) 26.861 Volume: (cm) 2.686 % Reduction in Area: -204% % Reduction in Volume: -203.8% Wound Description Classification: Grade 1 Exudate Amount: Medium Exudate Type: Serosanguineous Exudate Color: red, brown Periwound Skin Texture Texture Color No Abnormalities Noted: No No Abnormalities Noted: No Moisture No Abnormalities Noted: No Treatment Notes Wound #4 (Lower Leg) Wound Laterality: Left,  Anterior Cleanser Wound Cleanser Discharge Instruction: Cleanse the wound with wound cleanser prior to applying a clean dressing using gauze sponges, not tissue or cotton balls. Peri-Wound Care Sween Lotion (Moisturizing lotion) Discharge Instruction: Apply moisturizing lotion as directed Topical Gentamicin Discharge Instruction: As directed by physician Mupirocin Ointment Discharge Instruction: Apply Mupirocin (Bactroban) as instructed Primary Dressing PolyMem Silver Non-Adhesive Dressing, 4.25x4.25 in Discharge Instruction: Apply to wound bed as instructed Secondary Dressing ABD Pad, 8x10 Discharge Instruction: Apply over primary dressing as directed. Secured With [name] 4x5 (in/yd) Discharge Instruction: Secure with Coban as directed. [name] [name] ([id number]) [id number] Page 5 of 5 Kerlix Roll Sterile, 4.5x3.1 (in/yd) Discharge Instruction: Secure with Kerlix as directed. Compression Wrap Compression Stockings Add-Ons Electronic Signature(s) Signed: 12/11/2022 5:00:52 PM By: [name] [name] Entered By: [name] [name] on 12/11/2022 12:30:30 -------------------------------------------------------------------------------- Vitals Details Patient Name: Date of  Service: [name] [name] 12/11/2022 3:00 PM Medical Record Number: [id number] Patient Account Number: [id number] Date of Birth/Sex: Treating RN: Sep 17, 1965 (57 y.o. M) Primary Care Creedence Heiss: [name] [name] Other Clinician: Referring Harumi Yamin: Treating Ahria Slappey/Extender: [name] [name] Weeks in Treatment: 6 Vital Signs Time Taken: 15:12 Reference Range: 80 - 120 mg / dl Electronic Signature(s) Signed: 12/11/2022 5:00:52 PM By: [name] [name] Entered By: [name] [name] on 12/11/2022 12:13:15

## 2022-12-11 NOTE — Progress Notes (Signed)
Gammon, Izeah ([id number]) [id number] Page 1 of 1 Visit Report for 12/11/2022 SuperBill Details Patient Name: Date of Service: [name], [name] 12/11/2022 Medical Record Number: [id number] Patient Account Number: [id number] Date of Birth/Sex: Treating RN: 11/18/1965 (57 y.o. 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

## 2022-12-18 ENCOUNTER — Encounter (HOSPITAL_BASED_OUTPATIENT_CLINIC_OR_DEPARTMENT_OTHER): Payer: Medicare Other | Admitting: Internal Medicine

## 2022-12-18 DIAGNOSIS — E1122 Type 2 diabetes mellitus with diabetic chronic kidney disease: Secondary | ICD-10-CM | POA: Diagnosis not present

## 2022-12-18 DIAGNOSIS — I87312 Chronic venous hypertension (idiopathic) with ulcer of left lower extremity: Secondary | ICD-10-CM | POA: Diagnosis not present

## 2022-12-18 DIAGNOSIS — L97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verity: Secondary | ICD-10-CM | POA: Diagnosis not present

## 2022-12-18 DIAGNOSIS — E11622 Type 2 diabetes mellitus with other skin ulcer: Secondary | ICD-10-CM | POA: Diagnosis not present

## 2022-12-18 NOTE — Progress Notes (Signed)
Quach, Burr ([id number]) [id number] Page 1 of 11 Visit Report for 12/18/2022 Chief Complaint Document Details Patient Name: Date of Service: [name], [name] 12/18/2022 8:00 A M Medical Record Number: [id number] Patient Account Number: [id number] Date of Birth/Sex: Treating RN: [birth year] (57 y.o. M) Primary Care Provider: Baruch Goldmann Other Clinician: Referring Provider: Treating Provider/Extender: Almyra Deforest Weeks in Treatment: 7 Information Obtained from: Patient Chief Complaint 08/04/2019; patient is here for review of the wound on the right lateral lower leg 10/25/2022; patient arrives in clinic today for review of a wound on the left anterior mid tibia area that has been present for approximately 2 months Electronic Signature(s) Signed: 12/18/2022 1:53:07 PM By: Geralyn Corwin DO Entered By: Geralyn Corwin on 12/18/2022 05:45:47 -------------------------------------------------------------------------------- HPI Details Patient Name: Date of Service: [name], [name] 12/18/2022 8:00 A M Medical Record Number: [id number] Patient Account Number: [id number] Date of Birth/Sex: Treating RN: 1965-10-31 (57 y.o. M) Primary Care Provider: Baruch Goldmann Other Clinician: Referring Provider: Treating Provider/Extender: Almyra Deforest Weeks in Treatment: 7 History of Present Illness HPI Description: ADMISSION 08/04/2019 This is a 57 year old man who traumatized his right lateral lower leg while going up an escalator at the mall sometime in December 2020. He was referred to vascular surgery and on 04/14/2019 he saw Dr. Raynelle Fanning. He was felt to have "palpable pulses and normal noninvasive studies" although I cannot see these results. He was back in his primary doctor's office on 07/30/2019 for a nonhealing right lower extremity wound. He was referred to wound care I think in [facility] but his insurance was out of network. A CNS was  ordered. X-ray was done that was negative. He has been using wound cleanser and a Band-Aid. Past medical history is actually quite extensive and includes chronic kidney disease stage IV, chronic diastolic heart failure, atrial fibrillation, poorly controlled hypertension, poorly controlled diabetes with peripheral neuropathy and a recent hemoglobin A1c of 11.5, depression, lymphedema and sleep apnea ABI in our clinic was 1.1 in the right 5/6; this wound was initially traumatized while the patient was going down on an escalator. Arrived in clinic last week with a completely nonviable surface. He required mechanical debridement and we have been using Iodoflex under compression. Apparently the compression wraps fell down. He also has poorly controlled diabetes 5/15; traumatic/contusion wound to the right lateral lower leg. We have been using Iodoflex to clean at the surface of this which was completely nonviable along with mechanical debridements. He is under compression. 09/02/2019 upon evaluation today patient appears to be doing well with regard to his wound on his right lateral leg. He has been tolerating the dressing changes without complication. Fortunately there is no signs of active infection at this time. No fevers, chills, nausea, vomiting, or diarrhea. With that being said there is some necrotic tissue around the edges of the wound that is and require some sharp debridement today. That was discussed with the patient and his mother in the office today prior to debridement to which she did consent. 09/09/2019 upon evaluation today patient appears to be making progress in regard to his wound. Fortunately the wound is not nearly as deep as what it was. It is measuring a little bit larger due to having to clear some of the edges away but again that is necessary to get this to heal. Fortunately there is no evidence of active infection at this time. No fevers, chills, nausea, vomiting, or  diarrhea. 09/16/2019 on evaluation today patient  appears to be doing excellent in regard to his wound currently. In fact I do not see anything that we can need to debride today which is great news. The wound bed looks healthy and the edges of the wound look like they are doing great. Overall I feel like we are at a good place Benefiel, Huey ([id number]) [phone].pdf Page 2 of 11 and headed in the proper direction. 09/23/2019 upon evaluation today patient appears to be doing okay in regard to his wound. This is not measuring any smaller it is about the same. With that being said he tells me that his wrap actually slid down he ended up having to take this off he was on a trip and did not have his Velcro compression with him. With that being said I do not see any signs of anything significantly worsening which is good news. 09/30/2019 upon evaluation today patient appears to be doing a little bit better in regard to his leg ulcer. He has been tolerating the dressing changes without complication. Fortunately there is no signs of active infection at this time. No fevers, chills, nausea, vomiting, or diarrhea. 10/14/2019 upon evaluation today patient's wound appears to be healthier. Overall but unfortunately is measuring a little bit bigger. With that being said I do think that we do need to still continue to wrap his leg. Fortunately there is no signs of active infection at this time which is great news. 11/18/2019 upon evaluation today patient actually appears to be doing decently well with regard to his wound. He has been tolerating the dressing changes without complication. Fortunately there is no signs of active infection has been using just Neosporin at this point since he was in the hospital. He did have a venous Doppler study in the hospital though I cannot see the results that was from November 04, 2019. Nonetheless overall I am pleased with the fact the patient does seem to be doing  better. 11/25/2019 on evaluation today patient appears to be doing well with regard to his wounds. He has been tolerating the dressing changes without complication. Fortunately there is no signs of active infection at this time. No fevers, chills, nausea, vomiting, or diarrhea. I do believe that he does not appear to be showing any signs of worsening although he also seems to be having some trouble getting this to proceed in a good fashion towards healing. 12/02/2019 on evaluation today patient actually appears to be doing not quite as well in regard to his wounds. Both have some slough noted on the surface of the wound. Unfortunately he is continuing to have some issues here with pain as well in the anterior portion which I think is an issue. Fortunately there is no signs of active infection at this time which is great news. No fevers, chills, nausea, vomiting, or diarrhea. 12/30/2019 upon evaluation today patient unfortunately has not been seen in the past month we are just now seeing him back for reevaluation today last time I saw him was August 25. At that point he had a lateral wound on his leg and an anterior wound which which is starting. The lateral has healed unfortunately the anterior looks worse. With that being said the patient unfortunately seems to be having some issues today that I cannot completely explain by just the way the wound appears. The wound itself is not really hot to touch and to be honest I would not necessarily assume infection upon initial inspection. With that being said he is acting  very lethargic, was weak walking into the clinic according to his mother who is with him today, his blood pressure is actually in a normal range at around 110/73 which is unusual for him he normally runs extremely high in the 180s for systolic. His blood glucose was 227 here in the clinic today respiratory rate was 18 and his temperature was 97.7. Pulse was right around 70 when I checked this  manually. Nonetheless although his vital signs and normal circumstances would be reassuring I am still kind of concerned just because of the way he is acting. I did question him about medications that he has taken he told me that he took 2 Tylenol and tramadol this morning he also tells me he took 2 tramadol last night. With that being said I do not believe that just taking the tramadol is accounting for what is going on as well based on what I see today. I discussed all this with his mother as well as the patient face-to-face in the office today and subsequently I think I would recommend further evaluation at the ER as I really feel like there is something going on here that I cannot identify just here in the clinic. 01/13/2020 upon evaluation today patient presents for follow-up concerning ongoing issues with his right leg ulcer. After I last saw him 2 weeks ago he was actually sent to the ER where subsequently he ended up being placed on dialysis. He is doing better although he still seems very tired I think still in general he is doing much better. Fortunately there is no signs of active infection at this time. No fevers, chills, nausea, vomiting, or diarrhea. 01/26/2020; this is a patient with a large irregular albeit superficial wound on the right anterior lower leg. He has been using Santyl. He has recently been initiated on dialysis after we sent him to the ER earlier this month. He describes the wound is being very painful. By his description this started as a small raised area that rapidly expanded. He does not have a lot of obvious venous issues. Looking back through the records this was described as initially traumatic in April. He has seen vascular surgery and not felt to have an arterial issue 02/03/2020 upon evaluation today patient actually appears to be doing a little better in regard to his ulcer although it has spread inferiorly the central portion of the wound and towards the upper  has actually new skin growth. Fortunately there is no signs of active infection at this time which is great news. 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 With that being said some of the more significant issues here such as angiosarcoma does not appear to be likely based on pathology report. It seems that were more in the arterial or possible calciphylaxis realm based on what I am seeing visually. 02/10/2020 on evaluation today patient actually making some progress in regard to his leg ulcer. There is a lot of new skin growth which is excellent he tells me he still having a lot of pain but in general this seems to be showing signs of dramatic improvement which is great news. Overall I think that he is continuing to tolerate the Santyl along with ABD pad and  roll gauze to secure. 02/17/2020 on evaluation today patient appears to be doing well in regard to his wound. He has been tolerating the dressing changes without complication. Fortunately there is no signs of active infection at this time which is great news and overall very pleased with where he stands today. 02/24/2020 upon evaluation today patient appears to be doing very well with regard to his leg ulcer. He has been tolerating the dressing changes with the Santyl without complication. Fortunately there is no signs of active infection at this time. No fevers, chills, nausea, vomiting, or diarrhea. 03/09/2020 upon evaluation today patient appears to be doing very well in regard to his ulcer. He has been tolerating the dressing changes without complication. Fortunately there is no signs of active infection  at this time. Overall I think he is making great progress there is no depth to the wound and overall the patient seems to be feeling much better from a pain perspective as well. I am very pleased on all that he is doing so well. 03/23/2020 on evaluation today patient appears to be doing well with regard to his lower extremity ulcer. This is showing signs of excellent improvement he has just a very small area on the inferior portion of the wound that is still open. He still has pain but not nearly as bad as it was. 03/29/2020; this patient I do not usually see. He has a history of an extensive wound on the right anterior tibia area he has been using Santyl and is on Ace wrap at home with an ABD over the area. He changes the dressing himself. He still has 1 small area left at the most distal part of this wound almost all the rest of it is healed with scar tissue. In looking at the overall wound area he did have a few blisters which often means excessive fluid 04/13/2020 on evaluation today patient appears to be doing well with regard to his leg in fact this appears to be completely healed which is great news. Fortunately there is no signs of active infection at this time which is also great news. No fevers, chills, nausea, vomiting, or diarrhea. READMISSION 10/25/2022 This is a 57 year old man that we had extensively in the clinic from April 2021 to January 2022. He had a traumatic wound on the right anterior lower leg in the setting of type 2 diabetes chronic venous insufficiency and lymphedema. His ABIs on this assessment were 1.1. At that point he had stage IV chronic renal failure he has since gone on to start dialysis. The wound we are currently looking at is a wound on the left anterior lower leg. The patient says this started sometime in May when he hit the leg on a bed frame. Not clear exactly what they are doing for this currently but they were instructed by their primary doctor to use  antibacterial soap and topical antibiotics. He is also on doxycycline. The wound is not healing and they are here for a review of this. The patient complains of pain in the area but I could not get a history of claudication. He arrived in our clinic walking accompanied by his mother. His O2 sat was 77% on room air. He did not have any specific complaints of shortness of breath or chest pain. He tells me he was at [facility] roughly 2 years ago for a thorough pulmonary review. As noted he is on dialysis. He is a non-smoker Hopkin, Daronte ([id number]) [phone].pdf Page 3 of  11 7/25; patient presents for follow-up. He has not obtained his ABIs yet. We gave him the number to call vein and vascular to schedule as the order was sent last week. He has been using Iodoflex to the wound bed on the left anterior leg. He has no issues or complaints. At last clinic visit it was recommended he go to the ED due to his oxygen saturation and he was admitted and had an additional session of dialysis. He feels well today. 8/29; patient missed his to last follow-up appointment and I have not seen him in over a month. On 8/18 he visited the ED for left leg pain. At that time he was treated with doxycycline. He subsequently went back to the ED on 8/23 for worsening left lower extremity wound and was admitted and treated with IV ceftazidime and this was continued on discharged during dialysis. (Apparently he has been on IV ceftazidime prior to admission for outpatient cultures growing Pseudomonas bacteremia) Wound is larger but patient denies systemic signs of infection. He had ABIs completed that showed a normal left ABI and normal toe brachial index. It is unclear what dressing he has been using for the past month. 9/10; patient presents for follow-up. We have been using PolyMem silver with antibiotic ointment under Kerlix/Coban. Even though the wound is larger it has healthy granulation tissue and  depth has improved. He has no issues or complaints. He denies signs of infection. He has completed his course of antibiotics and dialysis. Electronic Signature(s) Signed: 12/18/2022 1:53:07 PM By: Geralyn Corwin DO Entered By: Geralyn Corwin on 12/18/2022 05:47:12 -------------------------------------------------------------------------------- Physical Exam Details Patient Name: Date of Service: [name], [name] 12/18/2022 8:00 A M Medical Record Number: [id number] Patient Account Number: [id number] Date of Birth/Sex: Treating RN: 09/07/1965 (58 y.o. M) Primary Care Provider: Baruch Goldmann Other Clinician: Referring Provider: Treating Provider/Extender: Almyra Deforest Weeks in Treatment: 7 Constitutional respirations regular, non-labored and within target range for patient.. Cardiovascular 2+ dorsalis pedis/posterior tibialis pulses. Psychiatric pleasant and cooperative. Notes T the left anterior mid tibia there is an open wound with mostly granulation tissue. 2+ pitting edema to the knee. No surrounding signs of soft tissue infection. o Electronic Signature(s) Signed: 12/18/2022 1:53:07 PM By: Geralyn Corwin DO Entered By: Geralyn Corwin on 12/18/2022 05:48:15 -------------------------------------------------------------------------------- Physician Orders Details Patient Name: Date of Service: [name], [name] 12/18/2022 8:00 A M Medical Record Number: [id number] Patient Account Number: [id number] Date of Birth/Sex: Treating RN: 11-07-1965 (57 y.o. 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 - w/ Dr. Leanord Hawking Tuesday 12/25/22 @ 8:15 Rm # 7 (already has appt) Return A Pt. will call if he needs to change it. Anesthetic (In clinic) Topical Lidocaine 5% applied to wound bed Bathing/ Shower/ Hygiene May shower with protection but do not get wound dressing(s) wet. Protect dressing(s) with water repellant cover (for example, large plastic bag) or a cast cover and may then take shower. - may purchase cast protector from [facility], [facility] or  [employer] Edema Control - Lymphedema / SCD / Other Elevate legs to the level of the heart or above for 30 minutes daily and/or when sitting for 3-4 times a day throughout the day. Avoid standing for long periods of time. Exercise regularly Moisturize legs daily. Additional Orders / Instructions Follow Nutritious Diet - closely monitor blood sugar. increase protein levels. Wound Treatment Wound #4 - Lower Leg Wound Laterality: Left, Anterior Cleanser: Wound Cleanser (Generic) 1 x Per Week/30 Days Discharge Instructions: Cleanse the wound with wound cleanser prior to applying a clean dressing using gauze sponges, not tissue or cotton balls. Peri-Wound Care: Sween Lotion (Moisturizing lotion) 1 x Per Week/30 Days Discharge Instructions: Apply moisturizing lotion as directed Topical: Gentamicin 1 x Per Week/30 Days Discharge Instructions: As directed by physician Topical: Mupirocin Ointment 1 x Per Week/30 Days Discharge Instructions: Apply Mupirocin (Bactroban) as instructed Prim Dressing: PolyMem Silver Non-Adhesive Dressing, 4.25x4.25 in 1 x Per Week/30 Days ary Discharge Instructions: Apply to wound bed as instructed Secondary Dressing: ABD Pad, 8x10 1 x Per Week/30  Days Discharge Instructions: Apply over primary dressing as directed. Secured With: Coban Self-Adherent Wrap 4x5 (in/yd) 1 x Per Week/30 Days Discharge Instructions: Secure with Coban as directed. Secured With: [employer], 4.5x3.1 (in/yd) 1 x Per Week/30 Days Discharge Instructions: Secure with Kerlix as directed. Electronic Signature(s) Signed: 12/18/2022 1:53:07 PM By: Geralyn Corwin DO Entered By: Geralyn Corwin on 12/18/2022 05:48:22 -------------------------------------------------------------------------------- Problem List Details Patient Name: Date of Service: [name], [name] 12/18/2022 8:00 A M Medical Record Number: [id number] Patient Account Number: [id number] Date of Birth/Sex: Treating RN: 02/28/1966 (57 y.o. [name] Primary Care Provider: Baruch Goldmann Other Clinician: Referring Provider: Treating Provider/Extender: Almyra Deforest Weeks in Treatment: 7 Active Problems Studzinski, Tiburcio Bash ([id number]) [id number] Page 5 of 11 ICD-10 Encounter Code Description Active Date MDM Diagnosis 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 10/25/2022 No Yes severity E11.622 Type 2 diabetes mellitus with other skin ulcer 10/25/2022 No Yes I87.312 Chronic venous hypertension (idiopathic) with ulcer of left lower extremity 12/06/2022 No Yes E11.22 Type 2 diabetes mellitus with diabetic chronic kidney disease 10/25/2022 No Yes R09.02 Hypoxemia 10/25/2022 No Yes N18.6 End stage renal disease 12/18/2022 No Yes Inactive Problems Resolved Problems Electronic Signature(s) Signed: 12/18/2022 1:53:07 PM By: Geralyn Corwin DO Entered By: Geralyn Corwin on 12/18/2022 05:50:02 -------------------------------------------------------------------------------- Progress Note Details Patient Name: Date of Service: [name], [name] 12/18/2022 8:00 A M Medical Record Number: [id number] Patient Account  Number: [id number] Date of Birth/Sex: Treating RN: 1965-05-12 (57 y.o. M) Primary Care Provider: Baruch Goldmann Other Clinician: Referring Provider: Treating Provider/Extender: Almyra Deforest Weeks in Treatment: 7 Subjective Chief Complaint Information obtained from Patient 08/04/2019; patient is here for review of the wound on the right lateral lower leg 10/25/2022; patient arrives in clinic today for review of a wound on the left anterior mid tibia area that has been present for approximately 2 months History of Present Illness (HPI) ADMISSION 08/04/2019 This is a 57 year old man who traumatized his right lateral lower leg while going [place] an escalator at the mall sometime in December 2020. He was referred to vascular surgery and on 04/14/2019 he saw Dr. Raynelle Fanning. He was felt to have "palpable pulses and normal noninvasive studies" although I cannot see these results. He was back in his primary doctor's office on 07/30/2019 for a nonhealing right lower extremity wound. He was referred to wound care I think in [place] but his insurance was out of network. A CNS was ordered. X-ray was done that was negative. He has been using wound cleanser and a Band-Aid. Past medical history is actually quite extensive and includes chronic kidney disease stage IV, chronic diastolic heart failure, atrial fibrillation, poorly controlled hypertension, poorly controlled diabetes with peripheral neuropathy and a recent hemoglobin A1c of 11.5, depression, lymphedema and sleep apnea ABI in our clinic was 1.1 in the right 5/6; this wound was initially traumatized while the patient was going down on an escalator. Arrived in clinic last week with a completely nonviable surface. He Loll, Bela ([id number]) [id number] Page 6 of 11 required mechanical debridement and we have been using Iodoflex under compression. Apparently the compression wraps fell down. He also has  poorly controlled diabetes 5/15; traumatic/contusion wound to the right lateral lower leg. We have been using Iodoflex to clean at the surface of this which was completely nonviable along with mechanical debridements. He is under compression. 09/02/2019 upon evaluation today patient appears to be doing well with regard to his wound on his right lateral leg. He has been tolerating the dressing changes without complication. Fortunately there is no signs of active infection at this time. No fevers, chills, nausea, vomiting, or diarrhea. With that being said there is some necrotic tissue around the edges of the wound that is and require some sharp debridement today. That was discussed with the patient and his mother in the office today prior to debridement to which she did consent. 09/09/2019 upon evaluation today patient appears to be making progress in regard to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

## 2022-12-19 NOTE — Progress Notes (Signed)
Furrow, Ennis ([id number]) [id number] Page 1 of 9 Visit Report for 12/18/2022 Arrival Information Details Patient Name: Date of Service: [name], [name] 12/18/2022 8:00 A M Medical Record Number: [id number] Patient Account Number: [id number] Date of Birth/Sex: Treating RN: November 14, 1965 (57 y.o. [name] Primary Care Branae Crail: Baruch Goldmann Other Clinician: Referring Breelle Hollywood: Treating Gresham Caetano/Extender: Almyra Deforest Weeks in Treatment: 7 Visit Information History Since Last Visit Added or deleted any medications: No Patient Arrived: Ambulatory Any new allergies or adverse reactions: No Arrival Time: 08:09 Had a fall or experienced change in No Accompanied By: family activities of daily living that may affect Transfer Assistance: None risk of falls: Patient Identification Verified: Yes Signs or symptoms of abuse/neglect since last visito No Secondary Verification Process Completed: Yes Hospitalized since last visit: No Patient Requires Transmission-Based Precautions: No Implantable device outside of the clinic excluding No Patient Has Alerts: Yes cellular tissue based products placed in the center Patient Alerts: ABI in clinic L [place] 7/24 since last visit: DO NOT USE LEFT ARM Has Dressing in Place as Prescribed: Yes Pain Present Now: Yes Electronic Signature(s) Signed: 12/18/2022 5:22:41 PM By: Shawn Stall RN, BSN Entered By: Shawn Stall on 12/18/2022 05:10:22 -------------------------------------------------------------------------------- Clinic Level of Care Assessment Details Patient Name: Date of Service: [name], [name] 12/18/2022 8:00 A M Medical Record Number: [id number] Patient Account Number: [id number] Date of Birth/Sex: Treating RN: 05-23-1965 (57 y.o. Charlean Merl, [name] Primary Care Demarrio Menges: Baruch Goldmann Other Clinician: Referring Yuritzy Zehring: Treating Allante Whitmire/Extender: Almyra Deforest Weeks in  Treatment: 7 Clinic Level of Care Assessment Items TOOL 4 Quantity Score X- 1 0 Use when only an EandM is performed on FOLLOW-UP visit ASSESSMENTS - Nursing Assessment / Reassessment X- 1 10 Reassessment of Co-morbidities (includes updates in patient status) X- 1 5 Reassessment of Adherence to Treatment Plan ASSESSMENTS - Wound and Skin A ssessment / Reassessment X - Simple Wound Assessment / Reassessment - one wound 1 5 []  - 0 Complex Wound Assessment / Reassessment - multiple wounds []  - 0 Dermatologic / Skin Assessment (not related to wound area) ASSESSMENTS - Focused Assessment X- 1 5 Circumferential Edema Measurements - multi extremities []  - 0 Nutritional Assessment / Counseling / Intervention Manalo, Dima ([id number]) [id number] Page 2 of 9 []  - 0 Lower Extremity Assessment (monofilament, tuning fork, pulses) []  - 0 Peripheral Arterial Disease Assessment (using hand held doppler) ASSESSMENTS - Ostomy and/or Continence Assessment and Care []  - 0 Incontinence Assessment and Management []  - 0 Ostomy Care Assessment and Management (repouching, etc.) PROCESS - Coordination of Care X - Simple Patient / Family Education for ongoing care 1 15 []  - 0 Complex (extensive) Patient / Family Education for ongoing care X- 1 10 Staff obtains Chiropractor, Records, T Results / Process Orders est []  - 0 Staff telephones HHA, Nursing Homes / Clarify orders / etc []  - 0 Routine Transfer to another Facility (non-emergent condition) []  - 0 Routine Hospital Admission (non-emergent condition) []  - 0 New Admissions / Manufacturing engineer / Ordering NPWT Apligraf, etc. , []  - 0 Emergency Hospital Admission (emergent condition) X- 1 10 Simple Discharge Coordination []  - 0 Complex (extensive) Discharge Coordination PROCESS - Special Needs []  - 0 Pediatric / Minor Patient Management []  - 0 Isolation Patient Management []  - 0 Hearing / Language / Visual  special needs []  - 0 Assessment of Community assistance (transportation, D/C planning, etc.) []  - 0 Additional assistance / Altered mentation []  - 0 Support Surface(s) Assessment (bed, cushion,  seat, etc.) INTERVENTIONS - Wound Cleansing / Measurement X - Simple Wound Cleansing - one wound 1 5 []  - 0 Complex Wound Cleansing - multiple wounds X- 1 5 Wound Imaging (photographs - any number of wounds) []  - 0 Wound Tracing (instead of photographs) X- 1 5 Simple Wound Measurement - one wound []  - 0 Complex Wound Measurement - multiple wounds INTERVENTIONS - Wound Dressings []  - 0 Small Wound Dressing one or multiple wounds X- 1 15 Medium Wound Dressing one or multiple wounds []  - 0 Large Wound Dressing one or multiple wounds []  - 0 Application of Medications - topical []  - 0 Application of Medications - injection INTERVENTIONS - Miscellaneous []  - 0 External ear exam []  - 0 Specimen Collection (cultures, biopsies, blood, body fluids, etc.) []  - 0 Specimen(s) / Culture(s) sent or taken to Lab for analysis []  - 0 Patient Transfer (multiple staff / Nurse, adult / Similar devices) []  - 0 Simple Staple / Suture removal (25 or less) []  - 0 Complex Staple / Suture removal (26 or more) []  - 0 Hypo / Hyperglycemic Management (close monitor of Blood Glucose) Dascenzo, Will ([id number]) [id number] Page 3 of 9 []  - 0 Ankle / Brachial Index (ABI) - do not check if billed separately X- 1 5 Vital Signs Has the patient been seen at the hospital within the last three years: Yes Total Score: 95 Level Of Care: New/Established - Level 3 Electronic Signature(s) Signed: 12/18/2022 3:48:55 PM By: Fonnie Mu RN Entered By: Fonnie Mu on 12/18/2022 05:47:09 -------------------------------------------------------------------------------- Encounter Discharge Information Details Patient Name: Date of Service: [name], [name] 12/18/2022 8:00 A M Medical  Record Number: [id number] Patient Account Number: [id number] Date of Birth/Sex: Treating RN: April 24, 1965 (57 y.o. Lucious Groves Primary Care Hiromi Knodel: Baruch Goldmann Other Clinician: Referring Verlin Duke: Treating Johnpaul Gillentine/Extender: Almyra Deforest Weeks in Treatment: 7 Encounter Discharge Information Items Discharge Condition: Stable Ambulatory Status: Ambulatory Discharge Destination: Home Transportation: Private Auto Accompanied By: sister Schedule Follow-up Appointment: Yes Clinical Summary of Care: Patient Declined Electronic Signature(s) Signed: 12/18/2022 3:48:55 PM By: Fonnie Mu RN Entered By: Fonnie Mu on 12/18/2022 05:47:43 -------------------------------------------------------------------------------- Lower Extremity Assessment Details Patient Name: Date of Service: [name], [name] 12/18/2022 8:00 A M Medical Record Number: [id number] Patient Account Number: [id number] Date of Birth/Sex: Treating RN: 05-May-1965 (57 y.o. [name] Primary Care Genesia Caslin: Baruch Goldmann Other Clinician: Referring Gagandeep Pettet: Treating Cordell Coke/Extender: Almyra Deforest Weeks in Treatment: 7 Edema Assessment Assessed: [Left: Yes] [Right: No] Edema: [Left: Ye] [Right: s] Calf Left: Right: Point of Measurement: 36 cm From Medial Instep 39 cm Ankle Left: Right: Point of Measurement: 12 cm From Medial Instep 22 cm Vascular Assessment Monts, Romen ([id number]) [[id number] Page 4 of 9] Pulses: Dorsalis Pedis Palpable: [Left:Yes] Extremity colors, hair growth, and conditions: Extremity Color: [Left:Normal] Hair Growth on Extremity: [Left:No] Temperature of Extremity: [Left:Warm] Capillary Refill: [Left:< 3 seconds] Dependent Rubor: [Left:No] Blanched when Elevated: [Left:No No] Toe Nail Assessment Left: Right: Thick: Yes Discolored: Yes Deformed: Yes Improper Length and Hygiene: No Electronic  Signature(s) Signed: 12/18/2022 5:22:41 PM By: Shawn Stall RN, BSN Entered By: Shawn Stall on 12/18/2022 05:14:44 -------------------------------------------------------------------------------- Multi Wound Chart Details Patient Name: Date of Service: [name], [name] 12/18/2022 8:00 A M Medical Record Number: [id number] Patient Account Number: [id number] Date of Birth/Sex: Treating RN: 1965/05/11 (57 y.o. M) Primary Care Rasheka Denard: Baruch Goldmann Other Clinician: Referring Amye Grego: Treating Avier Jech/Extender: Almyra Deforest Weeks in Treatment: 7 Vital Signs Height(in): Pulse(bpm): 85  Weight(lbs): Blood Pressure(mmHg): 189/99 Body Mass Index(BMI): Temperature(F): 98.2 Respiratory Rate(breaths/min): 20 [4:Photos:] [N/A:N/A] Left, Anterior Lower Leg N/A N/A Wound Location: Gradually Appeared N/A N/A Wounding Event: Diabetic Wound/Ulcer of the Lower N/A N/A Primary Etiology: Extremity Venous Leg Ulcer N/A N/A Secondary Etiology: Lymphedema, Sleep Apnea, N/A N/A Comorbid History: Arrhythmia, Congestive Heart Failure, Hypertension, Peripheral Arterial Disease, Type II Diabetes, End Stage Renal Disease 08/10/2022 N/A N/A Date Acquired: 7 N/A N/A Weeks of Treatment: Open N/A N/A Wound Status: No N/A N/A Wound Recurrence: 11.5x3.7x0.1 N/A N/A Measurements L x W x D (cm) 33.419 N/A N/A A (cm) : rea 3.342 N/A N/A Volume (cm) : -278.20% N/A N/A % Reduction in Area: Karge, Izzac ([id number]) [id number] Page 5 of 9 -278.10% N/A N/A % Reduction in Volume: Grade 1 N/A N/A Classification: Medium N/A N/A Exudate A mount: Serosanguineous N/A N/A Exudate Type: red, brown N/A N/A Exudate Color: Distinct, outline attached N/A N/A Wound Margin: Large (67-100%) N/A N/A Granulation A mount: Red, Pink N/A N/A Granulation Quality: Small (1-33%) N/A N/A Necrotic A mount: Fat Layer (Subcutaneous Tissue): Yes N/A N/A Exposed  Structures: Fascia: No Tendon: No Muscle: No Joint: No Bone: No None N/A N/A Epithelialization: Excoriation: No N/A N/A Periwound Skin Texture: Induration: No Callus: No Crepitus: No Rash: No Scarring: No Maceration: No N/A N/A Periwound Skin Moisture: Dry/Scaly: No Hemosiderin Staining: Yes N/A N/A Periwound Skin Color: Atrophie Blanche: No Cyanosis: No Ecchymosis: No Erythema: No Mottled: No Pallor: No Rubor: No Treatment Notes Electronic Signature(s) Signed: 12/18/2022 1:53:07 PM By: Geralyn Corwin DO Entered By: Geralyn Corwin on 12/18/2022 05:45:37 -------------------------------------------------------------------------------- Multi-Disciplinary Care Plan Details Patient Name: Date of Service: [name], [name] 12/18/2022 8:00 A M Medical Record Number: [id number] Patient Account Number: [id number] Date of Birth/Sex: Treating RN: Jul 10, 1965 (57 y.o. [name] Primary Care Levi Klaiber: Baruch Goldmann Other Clinician: Referring Clorissa Gruenberg: Treating Rubee Vega/Extender: Almyra Deforest Weeks in Treatment: 7 Active Inactive Necrotic Tissue Nursing Diagnoses: Impaired tissue integrity related to necrotic/devitalized tissue Goals: Necrotic/devitalized tissue will be minimized in the wound bed Date Initiated: 10/25/2022 Target Resolution Date: 01/04/2023 Goal Status: Active Interventions: Assess patient pain level pre-, during and post procedure and prior to discharge Treatment Activities: Enzymatic debridement : 10/25/2022 T ordered outside of clinic : 10/25/2022 est Notes: Sylla, Yaser ([id number]) [id number] Page 6 of 9 Wound/Skin Impairment Nursing Diagnoses: Knowledge deficit related to ulceration/compromised skin integrity Goals: Patient/caregiver will verbalize understanding of skin care regimen Date Initiated: 10/25/2022 Target Resolution Date: 01/24/2023 Goal Status: Active Ulcer/skin breakdown will heal  within 14 weeks Date Initiated: 10/25/2022 Date Inactivated: 12/06/2022 Target Resolution Date: 11/16/2022 Goal Status: Unmet Unmet Reason: non compliance Interventions: Assess patient/caregiver ability to perform ulcer/skin care regimen upon admission and as needed Assess ulceration(s) every visit Provide education on ulcer and skin care Treatment Activities: Skin care regimen initiated : 10/25/2022 Topical wound management initiated : 10/25/2022 Notes: Electronic Signature(s) Signed: 12/18/2022 5:22:41 PM By: Shawn Stall RN, BSN Entered By: Shawn Stall on 12/18/2022 05:22:58 -------------------------------------------------------------------------------- Pain Assessment Details Patient Name: Date of Service: [name], [name] 12/18/2022 8:00 A M Medical Record Number: [id number] Patient Account Number: [id number] Date of Birth/Sex: Treating RN: [birth year] (57 y.o. [name] Primary Care Raphael Espe: Baruch Goldmann Other Clinician: Referring Kaliyan Osbourn: Treating Menashe Kafer/Extender: Almyra Deforest Weeks in Treatment: 7 Active Problems Location of Pain Severity and Description of Pain Patient Has Paino Yes Site Locations Pain Location: Generalized Pain, Pain in Ulcers Rate the pain. Current Pain Level: 10 Worst Pain  Level: 10 Least Pain Level: 0 Tolerable Pain Level: 8 Pain Management and Medication Current Pain Management: Medication: Yes Cold Application: No Rest: Yes Massage: No Activity: No T.E.N.S.: No Heat Application: No Leg drop or elevation: No Is the Current Pain Management Adequate: Inadequate Hannan, Phineas ([id number]) [id number] Page 7 of 9 How does your wound impact your activities of daily livingo Sleep: No Bathing: No Appetite: No Relationship With Others: No Bladder Continence: No Emotions: No Bowel Continence: No Work: No Toileting: No Drive: No Dressing: No Hobbies: No Notes Patient did not take his  oral pain medication. Electronic Signature(s) Signed: 12/18/2022 5:22:41 PM By: Shawn Stall RN, BSN Entered By: Shawn Stall on 12/18/2022 05:11:40 -------------------------------------------------------------------------------- Patient/Caregiver Education Details Patient Name: Date of Service: [name], [name] 9/10/2024andnbsp8:00 A M Medical Record Number: [id number] Patient Account Number: [id number] Date of Birth/Gender: Treating RN: 04/29/1965 (57 y.o. [name] Primary Care Physician: Baruch Goldmann Other Clinician: Referring Physician: Treating Physician/Extender: Almyra Deforest Weeks in Treatment: 7 Education Assessment Education Provided To: Patient Education Topics Provided Wound/Skin Impairment: Handouts: Caring for Your Ulcer Methods: Explain/Verbal Responses: Reinforcements needed Electronic Signature(s) Signed: 12/18/2022 5:22:41 PM By: Shawn Stall RN, BSN Entered By: Shawn Stall on 12/18/2022 05:23:10 -------------------------------------------------------------------------------- Wound Assessment Details Patient Name: Date of Service: [name], [name] 12/18/2022 8:00 A M Medical Record Number: [id number] Patient Account Number: [id number] Date of Birth/Sex: Treating RN: [birth year] (57 y.o. [name] Primary Care Mylisa Brunson: Baruch Goldmann Other Clinician: Referring Lola Czerwonka: Treating Judah Carchi/Extender: Almyra Deforest Weeks in Treatment: 7 Wound Status Wound Number: 4 Primary Diabetic Wound/Ulcer of the Lower Extremity Etiology: Wound Location: Left, Anterior Lower Leg Secondary Venous Leg Ulcer Wounding Event: Gradually Appeared Etiology: Date Acquired: 08/10/2022 Wound Open Weeks Of Treatment: 7 Status: Clustered Wound: No Grigoryan, Torence ([id number]) [id number] Page 8 of 9 Clustered Wound: No Comorbid Lymphedema, Sleep Apnea, Arrhythmia, Congestive Heart Failure, History: Hypertension,  Peripheral Arterial Disease, Type II Diabetes, End Stage Renal Disease Photos Wound Measurements Length: (cm) 11.5 Width: (cm) 3.7 Depth: (cm) 0.1 Area: (cm) 33.419 Volume: (cm) 3.342 % Reduction in Area: -278.2% % Reduction in Volume: -278.1% Epithelialization: None Tunneling: No Undermining: No Wound Description Classification: Grade 1 Wound Margin: Distinct, outline attached Exudate Amount: Medium Exudate Type: Serosanguineous Exudate Color: red, brown Foul Odor After Cleansing: No Slough/Fibrino Yes Wound Bed Granulation Amount: Large (67-100%) Exposed Structure Granulation Quality: Red, Pink Fascia Exposed: No Necrotic Amount: Small (1-33%) Fat Layer (Subcutaneous Tissue) Exposed: Yes Necrotic Quality: Adherent Slough Tendon Exposed: No Muscle Exposed: No Joint Exposed: No Bone Exposed: No Periwound Skin Texture Texture Color No Abnormalities Noted: No No Abnormalities Noted: No Callus: No Atrophie Blanche: No Crepitus: No Cyanosis: No Excoriation: No Ecchymosis: No Induration: No Erythema: No Rash: No Hemosiderin Staining: Yes Scarring: No Mottled: No Pallor: No Moisture Rubor: No No Abnormalities Noted: No Dry / Scaly: No Maceration: No Treatment Notes Wound #4 (Lower Leg) Wound Laterality: Left, Anterior Cleanser Wound Cleanser Discharge Instruction: Cleanse the wound with wound cleanser prior to applying a clean dressing using gauze sponges, not tissue or cotton balls. Peri-Wound Care Sween Lotion (Moisturizing lotion) Discharge Instruction: Apply moisturizing lotion as directed Topical Gentamicin Discharge Instruction: As directed by physician Mupirocin Ointment Discharge Instruction: Apply Mupirocin (Bactroban) as instructed Chargois, Aaronjames ([id number]) [id number] Page 9 of 9 Primary Dressing PolyMem Silver Non-Adhesive Dressing, 4.25x4.25 in Discharge Instruction: Apply to wound bed as instructed Secondary  Dressing ABD Pad, 8x10 Discharge Instruction: Apply over primary dressing as directed. Secured  With Coban Self-Adherent Wrap 4x5 (in/yd) Discharge Instruction: Secure with Coban as directed. Kerlix Roll Sterile, 4.5x3.1 (in/yd) Discharge Instruction: Secure with Kerlix as directed. Compression Wrap Compression Stockings Add-Ons Electronic Signature(s) Signed: 12/18/2022 5:22:41 PM By: Shawn Stall RN, BSN Entered By: Shawn Stall on 12/18/2022 05:20:26 -------------------------------------------------------------------------------- Vitals Details Patient Name: Date of Service: [name], [name] 12/18/2022 8:00 A M Medical Record Number: [id number] Patient Account Number: [id number] Date of Birth/Sex: Treating RN: [birth year] (57 y.o. [name] Primary Care Billie Trager: Baruch Goldmann Other Clinician: Referring Kirston Luty: Treating Yousif Edelson/Extender: Almyra Deforest Weeks in Treatment: 7 Vital Signs Time Taken: 08:10 Temperature (F): 98.2 Pulse (bpm): 85 Respiratory Rate (breaths/min): 20 Blood Pressure (mmHg): 189/99 Reference Range: 80 - 120 mg / dl Electronic Signature(s) Signed: 12/18/2022 5:22:41 PM By: Shawn Stall RN, BSN Entered By: Shawn Stall on 12/18/2022 05:10:33

## 2022-12-25 ENCOUNTER — Encounter (HOSPITAL_BASED_OUTPATIENT_CLINIC_OR_DEPARTMENT_OTHER): Payer: Medicare Other | Admitting: Internal Medicine

## 2022-12-25 DIAGNOSIS — E11622 Type 2 diabetes mellitus with other skin ulcer: Secondary | ICD-10-CM | POA: Diagnosis not present

## 2022-12-27 NOTE — Progress Notes (Signed)
11/16/2022 Goal Status: Unmet Unmet Reason: non compliance Interventions: Assess patient/caregiver ability to perform ulcer/skin care regimen upon admission and as needed Assess ulceration(s) every visit Provide education on ulcer and skin care Treatment Activities: Skin care regimen initiated : 10/25/2022 Topical wound management initiated : 10/25/2022 Notes: Electronic Signature(s) Signed: 12/27/2022 4:20:28 PM By: Brenton Grills Entered By: Brenton Grills on 12/25/2022  14:06:18 -------------------------------------------------------------------------------- Pain Assessment Details Patient Name: Date of Service: [name], [name] 12/25/2022 1:45 PM Medical Record Number: [id number] Patient Account Number: [id number] Date of Birth/Sex: Treating RN: 1965/04/16 (57 y.o. [name] Primary Care Brailen Macneal: Baruch Goldmann Other Clinician: Referring Alakai Macbride: Treating Lossie Kalp/Extender: [name] Weeks in Treatment: 8 Active Problems Location of Pain Severity and Description of Pain Patient Has Paino No Site Locations Pain Management and Medication Current Pain Management: Electronic Signature(s) Signed: 12/27/2022 4:20:28 PM By: Brenton Grills Entered By: Brenton Grills on 12/25/2022 13:56:00 Luger, [name] ([id number]) [id number] Page 6 of 8 -------------------------------------------------------------------------------- Patient/Caregiver Education Details Patient Name: Date of Service: [name], [name] 9/17/2024andnbsp1:45 PM Medical Record Number: [id number] Patient Account Number: [id number] Date of Birth/Gender: Treating RN: 11-27-1965 (57 y.o. [name] Primary Care Physician: Baruch Goldmann Other Clinician: Referring Physician: Treating Physician/Extender: [name] in Treatment: 8 Education Assessment Education Provided To: Patient and Caregiver Education Topics Provided Wound/Skin Impairment: Methods: Explain/Verbal Responses: State content correctly Electronic Signature(s) Signed: 12/27/2022 4:20:28 PM By: Brenton Grills Entered By: Brenton Grills on 12/25/2022 14:06:40 -------------------------------------------------------------------------------- Wound Assessment Details Patient Name: Date of Service: [name], [name] 12/25/2022 1:45 PM Medical Record Number: [id number] Patient Account Number: [id number] Date of Birth/Sex: Treating RN: 26-Oct-1965 (57 y.o. [name] Primary Care Derryck Shahan: Baruch Goldmann Other Clinician: Referring Glendia Olshefski: Treating Ariany Kesselman/Extender: [name] Weeks in Treatment: 8 Wound Status Wound Number: 4 Primary Diabetic Wound/Ulcer of the Lower Extremity Etiology: Wound Location: Left, Anterior Lower Leg Secondary Venous Leg Ulcer Wounding Event: Gradually Appeared Etiology: Date Acquired: 08/10/2022 Wound Open Weeks Of Treatment: 8 Status: Clustered Wound: No Comorbid Lymphedema, Sleep Apnea, Arrhythmia, Congestive Heart Failure, History: Hypertension, Peripheral Arterial Disease, Type II Diabetes, End Stage Renal Disease Photos Wound Measurements Cabello, [name] ([id number]) Length: (cm) 11 Width: (cm) 3.3 Depth: (cm) 0.1 Area: (cm) 28.51 Volume: (cm) 2.851 [id number] Page 7 of 8 % Reduction in Area: -222.7% % Reduction in Volume: -222.5% Epithelialization: None Tunneling: No Undermining: No Wound Description Classification: Grade 1 Wound Margin: Distinct, outline attached Exudate Amount: Medium Exudate Type: Serosanguineous Exudate Color: red, brown Foul Odor After Cleansing: No Slough/Fibrino Yes Wound Bed Granulation Amount: Large (67-100%) Exposed Structure Granulation Quality: Red, Pink Fascia Exposed: No Necrotic Amount: Small (1-33%) Fat Layer (Subcutaneous Tissue) Exposed: Yes Necrotic Quality: Adherent Slough Tendon Exposed: No Muscle Exposed: No Joint Exposed: No Bone Exposed: No Periwound Skin Texture Texture Color No Abnormalities Noted: No No Abnormalities Noted: No Callus: No Atrophie Blanche: No Crepitus: No Cyanosis: No Excoriation: No Ecchymosis: No Induration: No Erythema: No Rash: No Hemosiderin Staining: Yes Scarring: No Mottled: No Pallor: No Moisture Rubor: No No Abnormalities Noted: No Dry / Scaly: No Maceration: No Treatment Notes Wound #4 (Lower Leg) Wound Laterality: Left,  Anterior Cleanser Wound Cleanser Discharge Instruction: Cleanse the wound with wound cleanser prior to applying a clean dressing using gauze sponges, not tissue or cotton balls. Peri-Wound Care Sween Lotion (Moisturizing lotion) Discharge Instruction: Apply moisturizing lotion as directed Topical Primary Dressing IODOFLEX 0.9% Cadexomer Iodine Pad 4x6 cm Discharge Instruction: Apply to wound bed as  instructed Secondary Dressing ABD Pad, 8x10 Discharge Instruction: Apply over primary dressing as directed. Secured With Compression Wrap CoFlex Calamine Unna Boot 4 x 6 (in/yd) Discharge Instruction: Apply Coflex Calamine [employer] as directed. Compression Stockings Add-Ons Electronic Signature(s) Signed: 12/27/2022 4:20:28 PM By: Brenton Grills Entered By: Brenton Grills on 12/25/2022 14:02:35 Halfhill, [name] ([id number]) [id number] Page 8 of 8 -------------------------------------------------------------------------------- Vitals Details Patient Name: Date of Service: [name], [name] 12/25/2022 1:45 PM Medical Record Number: [id number] Patient Account Number: [id number] Date of Birth/Sex: Treating RN: October 11, 1965 (57 y.o. [name] Primary Care Rodolfo Gaster: Baruch Goldmann Other Clinician: Referring Jewell Haught: Treating Solae Norling/Extender: [name] Weeks in Treatment: 8 Vital Signs Time Taken: 13:53 Temperature (F): 97.7 Pulse (bpm): 88 Respiratory Rate (breaths/min): 18 Blood Pressure (mmHg): 148/72 Reference Range: 80 - 120 mg / dl Electronic Signature(s) Signed: 12/27/2022 4:20:28 PM By: Brenton Grills Entered By: Brenton Grills on 12/25/2022 14:55:22  11/16/2022 Goal Status: Unmet Unmet Reason: non compliance Interventions: Assess patient/caregiver ability to perform ulcer/skin care regimen upon admission and as needed Assess ulceration(s) every visit Provide education on ulcer and skin care Treatment Activities: Skin care regimen initiated : 10/25/2022 Topical wound management initiated : 10/25/2022 Notes: Electronic Signature(s) Signed: 12/27/2022 4:20:28 PM By: Brenton Grills Entered By: Brenton Grills on 12/25/2022  14:06:18 -------------------------------------------------------------------------------- Pain Assessment Details Patient Name: Date of Service: [name], [name] 12/25/2022 1:45 PM Medical Record Number: [id number] Patient Account Number: [id number] Date of Birth/Sex: Treating RN: 1965/04/16 (57 y.o. [name] Primary Care Brailen Macneal: Baruch Goldmann Other Clinician: Referring Alakai Macbride: Treating Lossie Kalp/Extender: [name] Weeks in Treatment: 8 Active Problems Location of Pain Severity and Description of Pain Patient Has Paino No Site Locations Pain Management and Medication Current Pain Management: Electronic Signature(s) Signed: 12/27/2022 4:20:28 PM By: Brenton Grills Entered By: Brenton Grills on 12/25/2022 13:56:00 Luger, [name] ([id number]) [id number] Page 6 of 8 -------------------------------------------------------------------------------- Patient/Caregiver Education Details Patient Name: Date of Service: [name], [name] 9/17/2024andnbsp1:45 PM Medical Record Number: [id number] Patient Account Number: [id number] Date of Birth/Gender: Treating RN: 11-27-1965 (57 y.o. [name] Primary Care Physician: Baruch Goldmann Other Clinician: Referring Physician: Treating Physician/Extender: [name] in Treatment: 8 Education Assessment Education Provided To: Patient and Caregiver Education Topics Provided Wound/Skin Impairment: Methods: Explain/Verbal Responses: State content correctly Electronic Signature(s) Signed: 12/27/2022 4:20:28 PM By: Brenton Grills Entered By: Brenton Grills on 12/25/2022 14:06:40 -------------------------------------------------------------------------------- Wound Assessment Details Patient Name: Date of Service: [name], [name] 12/25/2022 1:45 PM Medical Record Number: [id number] Patient Account Number: [id number] Date of Birth/Sex: Treating RN: 26-Oct-1965 (57 y.o. [name] Primary Care Derryck Shahan: Baruch Goldmann Other Clinician: Referring Glendia Olshefski: Treating Ariany Kesselman/Extender: [name] Weeks in Treatment: 8 Wound Status Wound Number: 4 Primary Diabetic Wound/Ulcer of the Lower Extremity Etiology: Wound Location: Left, Anterior Lower Leg Secondary Venous Leg Ulcer Wounding Event: Gradually Appeared Etiology: Date Acquired: 08/10/2022 Wound Open Weeks Of Treatment: 8 Status: Clustered Wound: No Comorbid Lymphedema, Sleep Apnea, Arrhythmia, Congestive Heart Failure, History: Hypertension, Peripheral Arterial Disease, Type II Diabetes, End Stage Renal Disease Photos Wound Measurements Cabello, [name] ([id number]) Length: (cm) 11 Width: (cm) 3.3 Depth: (cm) 0.1 Area: (cm) 28.51 Volume: (cm) 2.851 [id number] Page 7 of 8 % Reduction in Area: -222.7% % Reduction in Volume: -222.5% Epithelialization: None Tunneling: No Undermining: No Wound Description Classification: Grade 1 Wound Margin: Distinct, outline attached Exudate Amount: Medium Exudate Type: Serosanguineous Exudate Color: red, brown Foul Odor After Cleansing: No Slough/Fibrino Yes Wound Bed Granulation Amount: Large (67-100%) Exposed Structure Granulation Quality: Red, Pink Fascia Exposed: No Necrotic Amount: Small (1-33%) Fat Layer (Subcutaneous Tissue) Exposed: Yes Necrotic Quality: Adherent Slough Tendon Exposed: No Muscle Exposed: No Joint Exposed: No Bone Exposed: No Periwound Skin Texture Texture Color No Abnormalities Noted: No No Abnormalities Noted: No Callus: No Atrophie Blanche: No Crepitus: No Cyanosis: No Excoriation: No Ecchymosis: No Induration: No Erythema: No Rash: No Hemosiderin Staining: Yes Scarring: No Mottled: No Pallor: No Moisture Rubor: No No Abnormalities Noted: No Dry / Scaly: No Maceration: No Treatment Notes Wound #4 (Lower Leg) Wound Laterality: Left,  Anterior Cleanser Wound Cleanser Discharge Instruction: Cleanse the wound with wound cleanser prior to applying a clean dressing using gauze sponges, not tissue or cotton balls. Peri-Wound Care Sween Lotion (Moisturizing lotion) Discharge Instruction: Apply moisturizing lotion as directed Topical Primary Dressing IODOFLEX 0.9% Cadexomer Iodine Pad 4x6 cm Discharge Instruction: Apply to wound bed as  11/16/2022 Goal Status: Unmet Unmet Reason: non compliance Interventions: Assess patient/caregiver ability to perform ulcer/skin care regimen upon admission and as needed Assess ulceration(s) every visit Provide education on ulcer and skin care Treatment Activities: Skin care regimen initiated : 10/25/2022 Topical wound management initiated : 10/25/2022 Notes: Electronic Signature(s) Signed: 12/27/2022 4:20:28 PM By: Brenton Grills Entered By: Brenton Grills on 12/25/2022  14:06:18 -------------------------------------------------------------------------------- Pain Assessment Details Patient Name: Date of Service: [name], [name] 12/25/2022 1:45 PM Medical Record Number: [id number] Patient Account Number: [id number] Date of Birth/Sex: Treating RN: 1965/04/16 (57 y.o. [name] Primary Care Brailen Macneal: Baruch Goldmann Other Clinician: Referring Alakai Macbride: Treating Lossie Kalp/Extender: [name] Weeks in Treatment: 8 Active Problems Location of Pain Severity and Description of Pain Patient Has Paino No Site Locations Pain Management and Medication Current Pain Management: Electronic Signature(s) Signed: 12/27/2022 4:20:28 PM By: Brenton Grills Entered By: Brenton Grills on 12/25/2022 13:56:00 Luger, [name] ([id number]) [id number] Page 6 of 8 -------------------------------------------------------------------------------- Patient/Caregiver Education Details Patient Name: Date of Service: [name], [name] 9/17/2024andnbsp1:45 PM Medical Record Number: [id number] Patient Account Number: [id number] Date of Birth/Gender: Treating RN: 11-27-1965 (57 y.o. [name] Primary Care Physician: Baruch Goldmann Other Clinician: Referring Physician: Treating Physician/Extender: [name] in Treatment: 8 Education Assessment Education Provided To: Patient and Caregiver Education Topics Provided Wound/Skin Impairment: Methods: Explain/Verbal Responses: State content correctly Electronic Signature(s) Signed: 12/27/2022 4:20:28 PM By: Brenton Grills Entered By: Brenton Grills on 12/25/2022 14:06:40 -------------------------------------------------------------------------------- Wound Assessment Details Patient Name: Date of Service: [name], [name] 12/25/2022 1:45 PM Medical Record Number: [id number] Patient Account Number: [id number] Date of Birth/Sex: Treating RN: 26-Oct-1965 (57 y.o. [name] Primary Care Derryck Shahan: Baruch Goldmann Other Clinician: Referring Glendia Olshefski: Treating Ariany Kesselman/Extender: [name] Weeks in Treatment: 8 Wound Status Wound Number: 4 Primary Diabetic Wound/Ulcer of the Lower Extremity Etiology: Wound Location: Left, Anterior Lower Leg Secondary Venous Leg Ulcer Wounding Event: Gradually Appeared Etiology: Date Acquired: 08/10/2022 Wound Open Weeks Of Treatment: 8 Status: Clustered Wound: No Comorbid Lymphedema, Sleep Apnea, Arrhythmia, Congestive Heart Failure, History: Hypertension, Peripheral Arterial Disease, Type II Diabetes, End Stage Renal Disease Photos Wound Measurements Cabello, [name] ([id number]) Length: (cm) 11 Width: (cm) 3.3 Depth: (cm) 0.1 Area: (cm) 28.51 Volume: (cm) 2.851 [id number] Page 7 of 8 % Reduction in Area: -222.7% % Reduction in Volume: -222.5% Epithelialization: None Tunneling: No Undermining: No Wound Description Classification: Grade 1 Wound Margin: Distinct, outline attached Exudate Amount: Medium Exudate Type: Serosanguineous Exudate Color: red, brown Foul Odor After Cleansing: No Slough/Fibrino Yes Wound Bed Granulation Amount: Large (67-100%) Exposed Structure Granulation Quality: Red, Pink Fascia Exposed: No Necrotic Amount: Small (1-33%) Fat Layer (Subcutaneous Tissue) Exposed: Yes Necrotic Quality: Adherent Slough Tendon Exposed: No Muscle Exposed: No Joint Exposed: No Bone Exposed: No Periwound Skin Texture Texture Color No Abnormalities Noted: No No Abnormalities Noted: No Callus: No Atrophie Blanche: No Crepitus: No Cyanosis: No Excoriation: No Ecchymosis: No Induration: No Erythema: No Rash: No Hemosiderin Staining: Yes Scarring: No Mottled: No Pallor: No Moisture Rubor: No No Abnormalities Noted: No Dry / Scaly: No Maceration: No Treatment Notes Wound #4 (Lower Leg) Wound Laterality: Left,  Anterior Cleanser Wound Cleanser Discharge Instruction: Cleanse the wound with wound cleanser prior to applying a clean dressing using gauze sponges, not tissue or cotton balls. Peri-Wound Care Sween Lotion (Moisturizing lotion) Discharge Instruction: Apply moisturizing lotion as directed Topical Primary Dressing IODOFLEX 0.9% Cadexomer Iodine Pad 4x6 cm Discharge Instruction: Apply to wound bed as

## 2022-12-27 NOTE — Progress Notes (Signed)
[id number] Patient Account Number: [id number] Date of Birth/Sex: Treating RN: 1966-03-18 (57 y.o. [name] Primary Care Provider: Baruch Goldmann Other Clinician: Referring Provider: Treating Provider/Extender: [name] Weeks in Treatment: 8 Verbal / Phone Orders: No Diagnosis Coding Follow-up Appointments ppointment in 1 week. - w/ Dr. Leanord Hawking Return A Please make appt. Anesthetic (In clinic) Topical Lidocaine 5% applied to wound bed Bathing/ Shower/ Hygiene May shower with protection but do not get wound dressing(s) wet. Protect dressing(s) with water repellant cover (for example, large plastic bag) or a cast cover and may then take shower. - may purchase cast protector from [facility], [facility] or [employer] Edema Control - Lymphedema / SCD / Other Elevate legs to the level of the heart or above for 30 minutes daily and/or when sitting for 3-4 times a day throughout the day. Avoid standing for long periods of time. Exercise regularly Moisturize legs daily. Additional Orders / Instructions Follow Nutritious Diet - closely monitor blood sugar.  increase protein levels. Wound Treatment Wound #4 - Lower Leg Wound Laterality: Left, Anterior Cleanser: Wound Cleanser (Generic) 1 x Per Week/30 Days Discharge Instructions: Cleanse the wound with wound cleanser prior to applying a clean dressing using gauze sponges, not tissue or cotton balls. Peri-Wound Care: Sween Lotion (Moisturizing lotion) 1 x Per Week/30 Days Discharge Instructions: Apply moisturizing lotion as directed Prim Dressing: IODOFLEX 0.9% Cadexomer Iodine Pad 4x6 cm 1 x Per Week/30 Days ary Discharge Instructions: Apply to wound bed as instructed Secondary Dressing: ABD Pad, 8x10 1 x Per Week/30 Days Discharge Instructions: Apply over primary dressing as directed. Compression Wrap: CoFlex Calamine Unna Boot 4 x 6 (in/yd) 1 x Per Week/30 Days Discharge Instructions: Apply Coflex Calamine [employer] as directed. Electronic Signature(s) Signed: 12/25/2022 4:34:08 PM By: Baltazar Najjar MD Signed: 12/27/2022 4:20:28 PM By: Brenton Grills Entered By: Brenton Grills on 12/25/2022 14:28:34 -------------------------------------------------------------------------------- Problem List Details Patient Name: Date of Service: [name], [name] 12/25/2022 1:45 PM [name], [name] Bash ([id number]) [id number] Page 5 of 9 Medical Record Number: [id number] Patient Account Number: [id number] Date of Birth/Sex: Treating RN: 07-01-1965 (57 y.o. [name] Primary Care Provider: Baruch Goldmann Other Clinician: Referring Provider: Treating Provider/Extender: [name] Weeks in Treatment: 8 Active Problems ICD-10 Encounter Code Description Active Date MDM Diagnosis 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 10/25/2022 No Yes severity E11.622 Type 2 diabetes mellitus with other skin ulcer 10/25/2022 No Yes I87.312 Chronic venous hypertension (idiopathic) with ulcer of left lower extremity 12/06/2022 No  Yes E11.22 Type 2 diabetes mellitus with diabetic chronic kidney disease 10/25/2022 No Yes R09.02 Hypoxemia 10/25/2022 No Yes N18.6 End stage renal disease 12/18/2022 No Yes Inactive Problems Resolved Problems Electronic Signature(s) Signed: 12/25/2022 4:34:08 PM By: Baltazar Najjar MD Entered By: Baltazar Najjar on 12/25/2022 15:07:26 -------------------------------------------------------------------------------- Progress Note Details Patient Name: Date of Service: [name], [name] 12/25/2022 1:45 PM Medical Record Number: [id number] Patient Account Number: [id number] Date of Birth/Sex: Treating RN: [birth year] (57 y.o. M) Primary Care Provider: Baruch Goldmann Other Clinician: Referring Provider: Treating Provider/Extender: [name] Weeks in Treatment: 8 Subjective History of Present Illness (HPI) ADMISSION 08/04/2019 This is a 57 year old man who traumatized his right lateral lower leg while going [place] escal[place] the mall sometime in December 2020. He was referred to vascular surgery and on 04/14/2019 he saw Dr. Raynelle [name]. He was felt to have "palpable pulses and normal noninvasive studies" although I cannot see these results.  primary doctor to use antibacterial soap and topical antibiotics. He is also on doxycycline. The wound is not healing and they are here for a review of this. The patient complains of pain in the area but I could not get a history of claudication. He arrived in our clinic walking accompanied by his mother. His O2 sat was 77% on room air. He did not have any specific complaints of shortness of breath or chest pain. He tells me he was at [facility] roughly 2 years ago for a thorough pulmonary review. As noted he is on dialysis. He is a non-smoker 7/25; patient presents for follow-up. He has not obtained his ABIs yet. We gave him the number to call vein and vascular to schedule as the order was sent last week. He has been using Iodoflex to the wound bed on the left anterior leg. He has no issues or complaints. At last clinic visit it was recommended he go to the ED due to his oxygen saturation and he was admitted and had an additional session of dialysis. He feels well today. 8/29; patient missed his to last follow-up appointment  and I have not seen him in over a month. On 8/18 he visited the ED for left leg pain. At that time he was treated with doxycycline. He subsequently went back to the ED on 8/23 for worsening left lower extremity wound and was admitted and treated with IV ceftazidime and this was continued on discharged during dialysis. (Apparently he has been on IV ceftazidime prior to admission for outpatient cultures growing Pseudomonas bacteremia) Wound is larger but patient denies systemic signs of infection. He had ABIs completed that showed a normal left ABI and normal toe brachial index. It is unclear what dressing he has been using for the past month. 9/10; patient presents for follow-up. We have been using PolyMem silver with antibiotic ointment under Kerlix/Coban. Even though the wound is larger it has healthy granulation tissue and depth has improved. He has no issues or complaints. He denies signs of infection. He has completed his course of antibiotics and dialysis. 9/17; this patient is using topical antibiotics under polymen kerlix Coban. The wound is large on the left anterior mid tibia area. It certainly does not have healthy granulation today. He complains of pain but no fever Electronic Signature(s) Signed: 12/25/2022 4:34:08 PM By: Baltazar Najjar MD Entered By: Baltazar Najjar on 12/25/2022 14:54:10 -------------------------------------------------------------------------------- Physical Exam Details Patient Name: Date of Service: [name], [name] 12/25/2022 1:45 PM Medical Record Number: [id number] Patient Account Number: [id number] Date of Birth/Sex: Treating RN: 09/20/1965 (57 y.o. M) Primary Care Provider: Baruch Goldmann Other Clinician: Referring Provider: Treating Provider/Extender: [name] Weeks in Treatment: 8 Constitutional Rechecked at 148/72. Pulse regular and within target range for patient.Marland Kitchen Respirations regular, non-labored and within target range..  Temperature is normal and within the target range for the patient.Marland Kitchen Appears in no distress. Notes Wound exam; left anterior lower leg in the setting of chronic venous insufficiency and hemosiderin deposition likely stasis dermatitis his edema control is marginal. I attempted debridement very lightly to remove the adherent surface debris I did not get as far as I might like. This was due to pain. No evidence of surrounding infection Electronic Signature(s) Signed: 12/25/2022 4:34:08 PM By: Baltazar Najjar MD Cwynar, [name] Bash ([id number]) [id number] Page 4 of 9 Entered By: Baltazar Najjar on 12/25/2022 14:55:52 -------------------------------------------------------------------------------- Physician Orders Details Patient Name: Date of Service: [name], [name] 12/25/2022 1:45 PM Medical Record Number:  primary doctor to use antibacterial soap and topical antibiotics. He is also on doxycycline. The wound is not healing and they are here for a review of this. The patient complains of pain in the area but I could not get a history of claudication. He arrived in our clinic walking accompanied by his mother. His O2 sat was 77% on room air. He did not have any specific complaints of shortness of breath or chest pain. He tells me he was at [facility] roughly 2 years ago for a thorough pulmonary review. As noted he is on dialysis. He is a non-smoker 7/25; patient presents for follow-up. He has not obtained his ABIs yet. We gave him the number to call vein and vascular to schedule as the order was sent last week. He has been using Iodoflex to the wound bed on the left anterior leg. He has no issues or complaints. At last clinic visit it was recommended he go to the ED due to his oxygen saturation and he was admitted and had an additional session of dialysis. He feels well today. 8/29; patient missed his to last follow-up appointment  and I have not seen him in over a month. On 8/18 he visited the ED for left leg pain. At that time he was treated with doxycycline. He subsequently went back to the ED on 8/23 for worsening left lower extremity wound and was admitted and treated with IV ceftazidime and this was continued on discharged during dialysis. (Apparently he has been on IV ceftazidime prior to admission for outpatient cultures growing Pseudomonas bacteremia) Wound is larger but patient denies systemic signs of infection. He had ABIs completed that showed a normal left ABI and normal toe brachial index. It is unclear what dressing he has been using for the past month. 9/10; patient presents for follow-up. We have been using PolyMem silver with antibiotic ointment under Kerlix/Coban. Even though the wound is larger it has healthy granulation tissue and depth has improved. He has no issues or complaints. He denies signs of infection. He has completed his course of antibiotics and dialysis. 9/17; this patient is using topical antibiotics under polymen kerlix Coban. The wound is large on the left anterior mid tibia area. It certainly does not have healthy granulation today. He complains of pain but no fever Electronic Signature(s) Signed: 12/25/2022 4:34:08 PM By: Baltazar Najjar MD Entered By: Baltazar Najjar on 12/25/2022 14:54:10 -------------------------------------------------------------------------------- Physical Exam Details Patient Name: Date of Service: [name], [name] 12/25/2022 1:45 PM Medical Record Number: [id number] Patient Account Number: [id number] Date of Birth/Sex: Treating RN: 09/20/1965 (57 y.o. M) Primary Care Provider: Baruch Goldmann Other Clinician: Referring Provider: Treating Provider/Extender: [name] Weeks in Treatment: 8 Constitutional Rechecked at 148/72. Pulse regular and within target range for patient.Marland Kitchen Respirations regular, non-labored and within target range..  Temperature is normal and within the target range for the patient.Marland Kitchen Appears in no distress. Notes Wound exam; left anterior lower leg in the setting of chronic venous insufficiency and hemosiderin deposition likely stasis dermatitis his edema control is marginal. I attempted debridement very lightly to remove the adherent surface debris I did not get as far as I might like. This was due to pain. No evidence of surrounding infection Electronic Signature(s) Signed: 12/25/2022 4:34:08 PM By: Baltazar Najjar MD Cwynar, [name] Bash ([id number]) [id number] Page 4 of 9 Entered By: Baltazar Najjar on 12/25/2022 14:55:52 -------------------------------------------------------------------------------- Physician Orders Details Patient Name: Date of Service: [name], [name] 12/25/2022 1:45 PM Medical Record Number:  [id number] Patient Account Number: [id number] Date of Birth/Sex: Treating RN: 1966-03-18 (57 y.o. [name] Primary Care Provider: Baruch Goldmann Other Clinician: Referring Provider: Treating Provider/Extender: [name] Weeks in Treatment: 8 Verbal / Phone Orders: No Diagnosis Coding Follow-up Appointments ppointment in 1 week. - w/ Dr. Leanord Hawking Return A Please make appt. Anesthetic (In clinic) Topical Lidocaine 5% applied to wound bed Bathing/ Shower/ Hygiene May shower with protection but do not get wound dressing(s) wet. Protect dressing(s) with water repellant cover (for example, large plastic bag) or a cast cover and may then take shower. - may purchase cast protector from [facility], [facility] or [employer] Edema Control - Lymphedema / SCD / Other Elevate legs to the level of the heart or above for 30 minutes daily and/or when sitting for 3-4 times a day throughout the day. Avoid standing for long periods of time. Exercise regularly Moisturize legs daily. Additional Orders / Instructions Follow Nutritious Diet - closely monitor blood sugar.  increase protein levels. Wound Treatment Wound #4 - Lower Leg Wound Laterality: Left, Anterior Cleanser: Wound Cleanser (Generic) 1 x Per Week/30 Days Discharge Instructions: Cleanse the wound with wound cleanser prior to applying a clean dressing using gauze sponges, not tissue or cotton balls. Peri-Wound Care: Sween Lotion (Moisturizing lotion) 1 x Per Week/30 Days Discharge Instructions: Apply moisturizing lotion as directed Prim Dressing: IODOFLEX 0.9% Cadexomer Iodine Pad 4x6 cm 1 x Per Week/30 Days ary Discharge Instructions: Apply to wound bed as instructed Secondary Dressing: ABD Pad, 8x10 1 x Per Week/30 Days Discharge Instructions: Apply over primary dressing as directed. Compression Wrap: CoFlex Calamine Unna Boot 4 x 6 (in/yd) 1 x Per Week/30 Days Discharge Instructions: Apply Coflex Calamine [employer] as directed. Electronic Signature(s) Signed: 12/25/2022 4:34:08 PM By: Baltazar Najjar MD Signed: 12/27/2022 4:20:28 PM By: Brenton Grills Entered By: Brenton Grills on 12/25/2022 14:28:34 -------------------------------------------------------------------------------- Problem List Details Patient Name: Date of Service: [name], [name] 12/25/2022 1:45 PM [name], [name] Bash ([id number]) [id number] Page 5 of 9 Medical Record Number: [id number] Patient Account Number: [id number] Date of Birth/Sex: Treating RN: 07-01-1965 (57 y.o. [name] Primary Care Provider: Baruch Goldmann Other Clinician: Referring Provider: Treating Provider/Extender: [name] Weeks in Treatment: 8 Active Problems ICD-10 Encounter Code Description Active Date MDM Diagnosis 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 10/25/2022 No Yes severity E11.622 Type 2 diabetes mellitus with other skin ulcer 10/25/2022 No Yes I87.312 Chronic venous hypertension (idiopathic) with ulcer of left lower extremity 12/06/2022 No  Yes E11.22 Type 2 diabetes mellitus with diabetic chronic kidney disease 10/25/2022 No Yes R09.02 Hypoxemia 10/25/2022 No Yes N18.6 End stage renal disease 12/18/2022 No Yes Inactive Problems Resolved Problems Electronic Signature(s) Signed: 12/25/2022 4:34:08 PM By: Baltazar Najjar MD Entered By: Baltazar Najjar on 12/25/2022 15:07:26 -------------------------------------------------------------------------------- Progress Note Details Patient Name: Date of Service: [name], [name] 12/25/2022 1:45 PM Medical Record Number: [id number] Patient Account Number: [id number] Date of Birth/Sex: Treating RN: [birth year] (57 y.o. M) Primary Care Provider: Baruch Goldmann Other Clinician: Referring Provider: Treating Provider/Extender: [name] Weeks in Treatment: 8 Subjective History of Present Illness (HPI) ADMISSION 08/04/2019 This is a 57 year old man who traumatized his right lateral lower leg while going [place] the mall sometime in December 2020. He was referred to vascular surgery and on 04/14/2019 he saw Dr. Raynelle [name]. He was felt to have "palpable pulses and normal noninvasive studies" although I cannot see these results.  primary doctor to use antibacterial soap and topical antibiotics. He is also on doxycycline. The wound is not healing and they are here for a review of this. The patient complains of pain in the area but I could not get a history of claudication. He arrived in our clinic walking accompanied by his mother. His O2 sat was 77% on room air. He did not have any specific complaints of shortness of breath or chest pain. He tells me he was at [facility] roughly 2 years ago for a thorough pulmonary review. As noted he is on dialysis. He is a non-smoker 7/25; patient presents for follow-up. He has not obtained his ABIs yet. We gave him the number to call vein and vascular to schedule as the order was sent last week. He has been using Iodoflex to the wound bed on the left anterior leg. He has no issues or complaints. At last clinic visit it was recommended he go to the ED due to his oxygen saturation and he was admitted and had an additional session of dialysis. He feels well today. 8/29; patient missed his to last follow-up appointment  and I have not seen him in over a month. On 8/18 he visited the ED for left leg pain. At that time he was treated with doxycycline. He subsequently went back to the ED on 8/23 for worsening left lower extremity wound and was admitted and treated with IV ceftazidime and this was continued on discharged during dialysis. (Apparently he has been on IV ceftazidime prior to admission for outpatient cultures growing Pseudomonas bacteremia) Wound is larger but patient denies systemic signs of infection. He had ABIs completed that showed a normal left ABI and normal toe brachial index. It is unclear what dressing he has been using for the past month. 9/10; patient presents for follow-up. We have been using PolyMem silver with antibiotic ointment under Kerlix/Coban. Even though the wound is larger it has healthy granulation tissue and depth has improved. He has no issues or complaints. He denies signs of infection. He has completed his course of antibiotics and dialysis. 9/17; this patient is using topical antibiotics under polymen kerlix Coban. The wound is large on the left anterior mid tibia area. It certainly does not have healthy granulation today. He complains of pain but no fever Electronic Signature(s) Signed: 12/25/2022 4:34:08 PM By: Baltazar Najjar MD Entered By: Baltazar Najjar on 12/25/2022 14:54:10 -------------------------------------------------------------------------------- Physical Exam Details Patient Name: Date of Service: [name], [name] 12/25/2022 1:45 PM Medical Record Number: [id number] Patient Account Number: [id number] Date of Birth/Sex: Treating RN: 09/20/1965 (57 y.o. M) Primary Care Provider: Baruch Goldmann Other Clinician: Referring Provider: Treating Provider/Extender: [name] Weeks in Treatment: 8 Constitutional Rechecked at 148/72. Pulse regular and within target range for patient.Marland Kitchen Respirations regular, non-labored and within target range..  Temperature is normal and within the target range for the patient.Marland Kitchen Appears in no distress. Notes Wound exam; left anterior lower leg in the setting of chronic venous insufficiency and hemosiderin deposition likely stasis dermatitis his edema control is marginal. I attempted debridement very lightly to remove the adherent surface debris I did not get as far as I might like. This was due to pain. No evidence of surrounding infection Electronic Signature(s) Signed: 12/25/2022 4:34:08 PM By: Baltazar Najjar MD Cwynar, [name] Bash ([id number]) [id number] Page 4 of 9 Entered By: Baltazar Najjar on 12/25/2022 14:55:52 -------------------------------------------------------------------------------- Physician Orders Details Patient Name: Date of Service: [name], [name] 12/25/2022 1:45 PM Medical Record Number:  30 minutes daily and/or when sitting for 3-4 times a day throughout the day. Avoid standing for long periods of time. Exercise regularly Moisturize legs daily. Additional Orders / Instructions: Follow Nutritious Diet - closely monitor blood sugar. increase protein levels. WOUND #4: - Lower Leg Wound Laterality: Left, Anterior Cleanser: Wound Cleanser (Generic) 1 x Per Week/30  Days Discharge Instructions: Cleanse the wound with wound cleanser prior to applying a clean dressing using gauze sponges, not tissue or cotton balls. Peri-Wound Care: Sween Lotion (Moisturizing lotion) 1 x Per Week/30 Days Discharge Instructions: Apply moisturizing lotion as directed Prim Dressing: IODOFLEX 0.9% Cadexomer Iodine Pad 4x6 cm 1 x Per Week/30 Days ary Discharge Instructions: Apply to wound bed as instructed Secondary Dressing: ABD Pad, 8x10 1 x Per Week/30 Days Discharge Instructions: Apply over primary dressing as directed. Com pression Wrap: CoFlex Calamine Unna Boot 4 x 6 (in/yd) 1 x Per Week/30 Days Discharge Instructions: Apply Coflex Calamine [employer] as directed. 1. Although the patient does have some new epithelialization on the lateral part of the wound most of this is covered with very adherent surface debris 2. I attempted mechanical debridement but was only able to get so far because of pain 3. I am going to change the primary dressing to Iodoflex to see if we can loosen this up a bit for next week and I will retry the mechanical debridement 4. Patient has severe chronic venous insufficiency was in this clinic I believe 2 years ago with an area on the right leg Electronic Signature(s) Signed: 12/25/2022 5:50:35 PM By: Shawn Stall RN, BSN Signed: 12/27/2022 4:42:34 PM By: Baltazar Najjar MD Previous Signature: 12/25/2022 4:34:08 PM Version By: Baltazar Najjar MD Entered By: Shawn Stall on 12/25/2022 17:43:16 -------------------------------------------------------------------------------- SuperBill Details Patient Name: Date of Service: [name], [name] 12/25/2022 Medical Record Number: [id number] Patient Account Number: [id number] Date of Birth/Sex: Treating RN: 04/10/1965 (57 y.o. 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 Hypoxemia N18.6 End stage renal disease Facility Procedures : CPT4 Code: [id number] Description: 97597 - DEBRIDE WOUND 1ST 20 SQ CM OR < ICD-10 Diagnosis Description 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 Modifier: verity Quantity: 1 : CPT4 Code: [id number] Description: 97598 - DEBRIDE WOUND EA ADDL 20 SQ CM ICD-10 Diagnosis Description 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 Modifier: verity Quantity: 1 Physician Procedures : CPT4 Code Description Modifier [id number] 97597 - WC PHYS DEBR WO ANESTH 20 SQ CM ICD-10 Diagnosis Description 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verity Quantity: 1 : [id number] 97598 - WC PHYS DEBR WO ANESTH EA ADD 20 CM ICD-10 Diagnosis Description 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verity Quantity: 1 Electronic Signature(s) Signed: 12/25/2022 4:34:08 PM By: Baltazar Najjar MD Entered By: Baltazar Najjar on 12/25/2022 14:57:29  primary doctor to use antibacterial soap and topical antibiotics. He is also on doxycycline. The wound is not healing and they are here for a review of this. The patient complains of pain in the area but I could not get a history of claudication. He arrived in our clinic walking accompanied by his mother. His O2 sat was 77% on room air. He did not have any specific complaints of shortness of breath or chest pain. He tells me he was at [facility] roughly 2 years ago for a thorough pulmonary review. As noted he is on dialysis. He is a non-smoker 7/25; patient presents for follow-up. He has not obtained his ABIs yet. We gave him the number to call vein and vascular to schedule as the order was sent last week. He has been using Iodoflex to the wound bed on the left anterior leg. He has no issues or complaints. At last clinic visit it was recommended he go to the ED due to his oxygen saturation and he was admitted and had an additional session of dialysis. He feels well today. 8/29; patient missed his to last follow-up appointment  and I have not seen him in over a month. On 8/18 he visited the ED for left leg pain. At that time he was treated with doxycycline. He subsequently went back to the ED on 8/23 for worsening left lower extremity wound and was admitted and treated with IV ceftazidime and this was continued on discharged during dialysis. (Apparently he has been on IV ceftazidime prior to admission for outpatient cultures growing Pseudomonas bacteremia) Wound is larger but patient denies systemic signs of infection. He had ABIs completed that showed a normal left ABI and normal toe brachial index. It is unclear what dressing he has been using for the past month. 9/10; patient presents for follow-up. We have been using PolyMem silver with antibiotic ointment under Kerlix/Coban. Even though the wound is larger it has healthy granulation tissue and depth has improved. He has no issues or complaints. He denies signs of infection. He has completed his course of antibiotics and dialysis. 9/17; this patient is using topical antibiotics under polymen kerlix Coban. The wound is large on the left anterior mid tibia area. It certainly does not have healthy granulation today. He complains of pain but no fever Electronic Signature(s) Signed: 12/25/2022 4:34:08 PM By: Baltazar Najjar MD Entered By: Baltazar Najjar on 12/25/2022 14:54:10 -------------------------------------------------------------------------------- Physical Exam Details Patient Name: Date of Service: [name], [name] 12/25/2022 1:45 PM Medical Record Number: [id number] Patient Account Number: [id number] Date of Birth/Sex: Treating RN: 09/20/1965 (57 y.o. M) Primary Care Provider: Baruch Goldmann Other Clinician: Referring Provider: Treating Provider/Extender: [name] Weeks in Treatment: 8 Constitutional Rechecked at 148/72. Pulse regular and within target range for patient.Marland Kitchen Respirations regular, non-labored and within target range..  Temperature is normal and within the target range for the patient.Marland Kitchen Appears in no distress. Notes Wound exam; left anterior lower leg in the setting of chronic venous insufficiency and hemosiderin deposition likely stasis dermatitis his edema control is marginal. I attempted debridement very lightly to remove the adherent surface debris I did not get as far as I might like. This was due to pain. No evidence of surrounding infection Electronic Signature(s) Signed: 12/25/2022 4:34:08 PM By: Baltazar Najjar MD Cwynar, [name] Bash ([id number]) [id number] Page 4 of 9 Entered By: Baltazar Najjar on 12/25/2022 14:55:52 -------------------------------------------------------------------------------- Physician Orders Details Patient Name: Date of Service: [name], [name] 12/25/2022 1:45 PM Medical Record Number:  30 minutes daily and/or when sitting for 3-4 times a day throughout the day. Avoid standing for long periods of time. Exercise regularly Moisturize legs daily. Additional Orders / Instructions: Follow Nutritious Diet - closely monitor blood sugar. increase protein levels. WOUND #4: - Lower Leg Wound Laterality: Left, Anterior Cleanser: Wound Cleanser (Generic) 1 x Per Week/30  Days Discharge Instructions: Cleanse the wound with wound cleanser prior to applying a clean dressing using gauze sponges, not tissue or cotton balls. Peri-Wound Care: Sween Lotion (Moisturizing lotion) 1 x Per Week/30 Days Discharge Instructions: Apply moisturizing lotion as directed Prim Dressing: IODOFLEX 0.9% Cadexomer Iodine Pad 4x6 cm 1 x Per Week/30 Days ary Discharge Instructions: Apply to wound bed as instructed Secondary Dressing: ABD Pad, 8x10 1 x Per Week/30 Days Discharge Instructions: Apply over primary dressing as directed. Com pression Wrap: CoFlex Calamine Unna Boot 4 x 6 (in/yd) 1 x Per Week/30 Days Discharge Instructions: Apply Coflex Calamine [employer] as directed. 1. Although the patient does have some new epithelialization on the lateral part of the wound most of this is covered with very adherent surface debris 2. I attempted mechanical debridement but was only able to get so far because of pain 3. I am going to change the primary dressing to Iodoflex to see if we can loosen this up a bit for next week and I will retry the mechanical debridement 4. Patient has severe chronic venous insufficiency was in this clinic I believe 2 years ago with an area on the right leg Electronic Signature(s) Signed: 12/25/2022 5:50:35 PM By: Shawn Stall RN, BSN Signed: 12/27/2022 4:42:34 PM By: Baltazar Najjar MD Previous Signature: 12/25/2022 4:34:08 PM Version By: Baltazar Najjar MD Entered By: Shawn Stall on 12/25/2022 17:43:16 -------------------------------------------------------------------------------- SuperBill Details Patient Name: Date of Service: [name], [name] 12/25/2022 Medical Record Number: [id number] Patient Account Number: [id number] Date of Birth/Sex: Treating RN: 04/10/1965 (57 y.o. 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 Hypoxemia N18.6 End stage renal disease Facility Procedures : CPT4 Code: 21308657 Description: 97597 - DEBRIDE WOUND 1ST 20 SQ CM OR < ICD-10 Diagnosis Description 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 Modifier: verity Quantity: 1 : CPT4 Code: 84696295 Description: 97598 - DEBRIDE WOUND EA ADDL 20 SQ CM ICD-10 Diagnosis Description 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 Modifier: verity Quantity: 1 Physician Procedures : CPT4 Code Description Modifier [id number] 97597 - WC PHYS DEBR WO ANESTH 20 SQ CM ICD-10 Diagnosis Description 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verity Quantity: 1 : [id number] 97598 - WC PHYS DEBR WO ANESTH EA ADD 20 CM ICD-10 Diagnosis Description 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verity Quantity: 1 Electronic Signature(s) Signed: 12/25/2022 4:34:08 PM By: Baltazar Najjar MD Entered By: Baltazar Najjar on 12/25/2022 14:57:29  30 minutes daily and/or when sitting for 3-4 times a day throughout the day. Avoid standing for long periods of time. Exercise regularly Moisturize legs daily. Additional Orders / Instructions: Follow Nutritious Diet - closely monitor blood sugar. increase protein levels. WOUND #4: - Lower Leg Wound Laterality: Left, Anterior Cleanser: Wound Cleanser (Generic) 1 x Per Week/30  Days Discharge Instructions: Cleanse the wound with wound cleanser prior to applying a clean dressing using gauze sponges, not tissue or cotton balls. Peri-Wound Care: Sween Lotion (Moisturizing lotion) 1 x Per Week/30 Days Discharge Instructions: Apply moisturizing lotion as directed Prim Dressing: IODOFLEX 0.9% Cadexomer Iodine Pad 4x6 cm 1 x Per Week/30 Days ary Discharge Instructions: Apply to wound bed as instructed Secondary Dressing: ABD Pad, 8x10 1 x Per Week/30 Days Discharge Instructions: Apply over primary dressing as directed. Com pression Wrap: CoFlex Calamine Unna Boot 4 x 6 (in/yd) 1 x Per Week/30 Days Discharge Instructions: Apply Coflex Calamine [employer] as directed. 1. Although the patient does have some new epithelialization on the lateral part of the wound most of this is covered with very adherent surface debris 2. I attempted mechanical debridement but was only able to get so far because of pain 3. I am going to change the primary dressing to Iodoflex to see if we can loosen this up a bit for next week and I will retry the mechanical debridement 4. Patient has severe chronic venous insufficiency was in this clinic I believe 2 years ago with an area on the right leg Electronic Signature(s) Signed: 12/25/2022 5:50:35 PM By: Shawn Stall RN, BSN Signed: 12/27/2022 4:42:34 PM By: Baltazar Najjar MD Previous Signature: 12/25/2022 4:34:08 PM Version By: Baltazar Najjar MD Entered By: Shawn Stall on 12/25/2022 17:43:16 -------------------------------------------------------------------------------- SuperBill Details Patient Name: Date of Service: [name], [name] 12/25/2022 Medical Record Number: [id number] Patient Account Number: [id number] Date of Birth/Sex: Treating RN: 04/10/1965 (57 y.o. 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 Hypoxemia N18.6 End stage renal disease Facility Procedures : CPT4 Code: 21308657 Description: 97597 - DEBRIDE WOUND 1ST 20 SQ CM OR < ICD-10 Diagnosis Description 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 Modifier: verity Quantity: 1 : CPT4 Code: 84696295 Description: 97598 - DEBRIDE WOUND EA ADDL 20 SQ CM ICD-10 Diagnosis Description 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 Modifier: verity Quantity: 1 Physician Procedures : CPT4 Code Description Modifier [id number] 97597 - WC PHYS DEBR WO ANESTH 20 SQ CM ICD-10 Diagnosis Description 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verity Quantity: 1 : [id number] 97598 - WC PHYS DEBR WO ANESTH EA ADD 20 CM ICD-10 Diagnosis Description 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verity Quantity: 1 Electronic Signature(s) Signed: 12/25/2022 4:34:08 PM By: Baltazar Najjar MD Entered By: Baltazar Najjar on 12/25/2022 14:57:29  primary doctor to use antibacterial soap and topical antibiotics. He is also on doxycycline. The wound is not healing and they are here for a review of this. The patient complains of pain in the area but I could not get a history of claudication. He arrived in our clinic walking accompanied by his mother. His O2 sat was 77% on room air. He did not have any specific complaints of shortness of breath or chest pain. He tells me he was at [facility] roughly 2 years ago for a thorough pulmonary review. As noted he is on dialysis. He is a non-smoker 7/25; patient presents for follow-up. He has not obtained his ABIs yet. We gave him the number to call vein and vascular to schedule as the order was sent last week. He has been using Iodoflex to the wound bed on the left anterior leg. He has no issues or complaints. At last clinic visit it was recommended he go to the ED due to his oxygen saturation and he was admitted and had an additional session of dialysis. He feels well today. 8/29; patient missed his to last follow-up appointment  and I have not seen him in over a month. On 8/18 he visited the ED for left leg pain. At that time he was treated with doxycycline. He subsequently went back to the ED on 8/23 for worsening left lower extremity wound and was admitted and treated with IV ceftazidime and this was continued on discharged during dialysis. (Apparently he has been on IV ceftazidime prior to admission for outpatient cultures growing Pseudomonas bacteremia) Wound is larger but patient denies systemic signs of infection. He had ABIs completed that showed a normal left ABI and normal toe brachial index. It is unclear what dressing he has been using for the past month. 9/10; patient presents for follow-up. We have been using PolyMem silver with antibiotic ointment under Kerlix/Coban. Even though the wound is larger it has healthy granulation tissue and depth has improved. He has no issues or complaints. He denies signs of infection. He has completed his course of antibiotics and dialysis. 9/17; this patient is using topical antibiotics under polymen kerlix Coban. The wound is large on the left anterior mid tibia area. It certainly does not have healthy granulation today. He complains of pain but no fever Electronic Signature(s) Signed: 12/25/2022 4:34:08 PM By: Baltazar Najjar MD Entered By: Baltazar Najjar on 12/25/2022 14:54:10 -------------------------------------------------------------------------------- Physical Exam Details Patient Name: Date of Service: [name], [name] 12/25/2022 1:45 PM Medical Record Number: [id number] Patient Account Number: [id number] Date of Birth/Sex: Treating RN: 09/20/1965 (57 y.o. M) Primary Care Provider: Baruch Goldmann Other Clinician: Referring Provider: Treating Provider/Extender: [name] Weeks in Treatment: 8 Constitutional Rechecked at 148/72. Pulse regular and within target range for patient.Marland Kitchen Respirations regular, non-labored and within target range..  Temperature is normal and within the target range for the patient.Marland Kitchen Appears in no distress. Notes Wound exam; left anterior lower leg in the setting of chronic venous insufficiency and hemosiderin deposition likely stasis dermatitis his edema control is marginal. I attempted debridement very lightly to remove the adherent surface debris I did not get as far as I might like. This was due to pain. No evidence of surrounding infection Electronic Signature(s) Signed: 12/25/2022 4:34:08 PM By: Baltazar Najjar MD Cwynar, [name] Bash ([id number]) [id number] Page 4 of 9 Entered By: Baltazar Najjar on 12/25/2022 14:55:52 -------------------------------------------------------------------------------- Physician Orders Details Patient Name: Date of Service: [name], [name] 12/25/2022 1:45 PM Medical Record Number:  [id number] Patient Account Number: [id number] Date of Birth/Sex: Treating RN: 1966-03-18 (57 y.o. [name] Primary Care Provider: Baruch Goldmann Other Clinician: Referring Provider: Treating Provider/Extender: [name] Weeks in Treatment: 8 Verbal / Phone Orders: No Diagnosis Coding Follow-up Appointments ppointment in 1 week. - w/ Dr. Leanord Hawking Return A Please make appt. Anesthetic (In clinic) Topical Lidocaine 5% applied to wound bed Bathing/ Shower/ Hygiene May shower with protection but do not get wound dressing(s) wet. Protect dressing(s) with water repellant cover (for example, large plastic bag) or a cast cover and may then take shower. - may purchase cast protector from [facility], [facility] or [employer] Edema Control - Lymphedema / SCD / Other Elevate legs to the level of the heart or above for 30 minutes daily and/or when sitting for 3-4 times a day throughout the day. Avoid standing for long periods of time. Exercise regularly Moisturize legs daily. Additional Orders / Instructions Follow Nutritious Diet - closely monitor blood sugar.  increase protein levels. Wound Treatment Wound #4 - Lower Leg Wound Laterality: Left, Anterior Cleanser: Wound Cleanser (Generic) 1 x Per Week/30 Days Discharge Instructions: Cleanse the wound with wound cleanser prior to applying a clean dressing using gauze sponges, not tissue or cotton balls. Peri-Wound Care: Sween Lotion (Moisturizing lotion) 1 x Per Week/30 Days Discharge Instructions: Apply moisturizing lotion as directed Prim Dressing: IODOFLEX 0.9% Cadexomer Iodine Pad 4x6 cm 1 x Per Week/30 Days ary Discharge Instructions: Apply to wound bed as instructed Secondary Dressing: ABD Pad, 8x10 1 x Per Week/30 Days Discharge Instructions: Apply over primary dressing as directed. Compression Wrap: CoFlex Calamine Unna Boot 4 x 6 (in/yd) 1 x Per Week/30 Days Discharge Instructions: Apply Coflex Calamine [employer] as directed. Electronic Signature(s) Signed: 12/25/2022 4:34:08 PM By: Baltazar Najjar MD Signed: 12/27/2022 4:20:28 PM By: Brenton Grills Entered By: Brenton Grills on 12/25/2022 14:28:34 -------------------------------------------------------------------------------- Problem List Details Patient Name: Date of Service: [name], [name] 12/25/2022 1:45 PM [name], [name] Bash ([id number]) [id number] Page 5 of 9 Medical Record Number: [id number] Patient Account Number: [id number] Date of Birth/Sex: Treating RN: 07-01-1965 (57 y.o. [name] Primary Care Provider: Baruch Goldmann Other Clinician: Referring Provider: Treating Provider/Extender: [name] Weeks in Treatment: 8 Active Problems ICD-10 Encounter Code Description Active Date MDM Diagnosis 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 10/25/2022 No Yes severity E11.622 Type 2 diabetes mellitus with other skin ulcer 10/25/2022 No Yes I87.312 Chronic venous hypertension (idiopathic) with ulcer of left lower extremity 12/06/2022 No  Yes E11.22 Type 2 diabetes mellitus with diabetic chronic kidney disease 10/25/2022 No Yes R09.02 Hypoxemia 10/25/2022 No Yes N18.6 End stage renal disease 12/18/2022 No Yes Inactive Problems Resolved Problems Electronic Signature(s) Signed: 12/25/2022 4:34:08 PM By: Baltazar Najjar MD Entered By: Baltazar Najjar on 12/25/2022 15:07:26 -------------------------------------------------------------------------------- Progress Note Details Patient Name: Date of Service: [name], [name] 12/25/2022 1:45 PM Medical Record Number: [id number] Patient Account Number: [id number] Date of Birth/Sex: Treating RN: [birth year] (57 y.o. M) Primary Care Provider: Baruch Goldmann Other Clinician: Referring Provider: Treating Provider/Extender: [name] Weeks in Treatment: 8 Subjective History of Present Illness (HPI) ADMISSION 08/04/2019 This is a 57 year old man who traumatized his right lateral lower leg while going [place] the mall sometime in December 2020. He was referred to vascular surgery and on 04/14/2019 he saw Dr. Raynelle [name]. He was felt to have "palpable pulses and normal noninvasive studies" although I cannot see these results.  [id number] Patient Account Number: [id number] Date of Birth/Sex: Treating RN: 1966-03-18 (57 y.o. [name] Primary Care Provider: Baruch Goldmann Other Clinician: Referring Provider: Treating Provider/Extender: [name] Weeks in Treatment: 8 Verbal / Phone Orders: No Diagnosis Coding Follow-up Appointments ppointment in 1 week. - w/ Dr. Leanord Hawking Return A Please make appt. Anesthetic (In clinic) Topical Lidocaine 5% applied to wound bed Bathing/ Shower/ Hygiene May shower with protection but do not get wound dressing(s) wet. Protect dressing(s) with water repellant cover (for example, large plastic bag) or a cast cover and may then take shower. - may purchase cast protector from [facility], [facility] or [employer] Edema Control - Lymphedema / SCD / Other Elevate legs to the level of the heart or above for 30 minutes daily and/or when sitting for 3-4 times a day throughout the day. Avoid standing for long periods of time. Exercise regularly Moisturize legs daily. Additional Orders / Instructions Follow Nutritious Diet - closely monitor blood sugar.  increase protein levels. Wound Treatment Wound #4 - Lower Leg Wound Laterality: Left, Anterior Cleanser: Wound Cleanser (Generic) 1 x Per Week/30 Days Discharge Instructions: Cleanse the wound with wound cleanser prior to applying a clean dressing using gauze sponges, not tissue or cotton balls. Peri-Wound Care: Sween Lotion (Moisturizing lotion) 1 x Per Week/30 Days Discharge Instructions: Apply moisturizing lotion as directed Prim Dressing: IODOFLEX 0.9% Cadexomer Iodine Pad 4x6 cm 1 x Per Week/30 Days ary Discharge Instructions: Apply to wound bed as instructed Secondary Dressing: ABD Pad, 8x10 1 x Per Week/30 Days Discharge Instructions: Apply over primary dressing as directed. Compression Wrap: CoFlex Calamine Unna Boot 4 x 6 (in/yd) 1 x Per Week/30 Days Discharge Instructions: Apply Coflex Calamine [employer] as directed. Electronic Signature(s) Signed: 12/25/2022 4:34:08 PM By: Baltazar Najjar MD Signed: 12/27/2022 4:20:28 PM By: Brenton Grills Entered By: Brenton Grills on 12/25/2022 14:28:34 -------------------------------------------------------------------------------- Problem List Details Patient Name: Date of Service: [name], [name] 12/25/2022 1:45 PM [name], [name] Bash ([id number]) [id number] Page 5 of 9 Medical Record Number: [id number] Patient Account Number: [id number] Date of Birth/Sex: Treating RN: 07-01-1965 (57 y.o. [name] Primary Care Provider: Baruch Goldmann Other Clinician: Referring Provider: Treating Provider/Extender: [name] Weeks in Treatment: 8 Active Problems ICD-10 Encounter Code Description Active Date MDM Diagnosis 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 10/25/2022 No Yes severity E11.622 Type 2 diabetes mellitus with other skin ulcer 10/25/2022 No Yes I87.312 Chronic venous hypertension (idiopathic) with ulcer of left lower extremity 12/06/2022 No  Yes E11.22 Type 2 diabetes mellitus with diabetic chronic kidney disease 10/25/2022 No Yes R09.02 Hypoxemia 10/25/2022 No Yes N18.6 End stage renal disease 12/18/2022 No Yes Inactive Problems Resolved Problems Electronic Signature(s) Signed: 12/25/2022 4:34:08 PM By: Baltazar Najjar MD Entered By: Baltazar Najjar on 12/25/2022 15:07:26 -------------------------------------------------------------------------------- Progress Note Details Patient Name: Date of Service: [name], [name] 12/25/2022 1:45 PM Medical Record Number: [id number] Patient Account Number: [id number] Date of Birth/Sex: Treating RN: [birth year] (57 y.o. M) Primary Care Provider: Baruch Goldmann Other Clinician: Referring Provider: Treating Provider/Extender: [name] Weeks in Treatment: 8 Subjective History of Present Illness (HPI) ADMISSION 08/04/2019 This is a 57 year old man who traumatized his right lateral lower leg while going [place] the mall sometime in December 2020. He was referred to vascular surgery and on 04/14/2019 he saw Dr. Raynelle [name]. He was felt to have "palpable pulses and normal noninvasive studies" although I cannot see these results.

## 2023-01-01 ENCOUNTER — Ambulatory Visit (HOSPITAL_BASED_OUTPATIENT_CLINIC_OR_DEPARTMENT_OTHER): Payer: Medicare Other | Admitting: Internal Medicine

## 2023-01-03 ENCOUNTER — Encounter (HOSPITAL_BASED_OUTPATIENT_CLINIC_OR_DEPARTMENT_OTHER): Payer: Medicare Other | Admitting: Internal Medicine

## 2023-01-03 DIAGNOSIS — E11622 Type 2 diabetes mellitus with other skin ulcer: Secondary | ICD-10-CM | POA: Diagnosis not present

## 2023-01-07 NOTE — Progress Notes (Signed)
Deneda Other Clinician: Referring Physician: Treating Physician/Extender: [name] in Treatment: 10 Education Assessment Education Provided To: Patient and Caregiver Education Topics Provided Wound/Skin Impairment: Methods: Explain/Verbal Responses: State content correctly Electronic Signature(s) Signed: 01/07/2023 2:03:22 PM By:  Brenton Grills Entered By: Brenton Grills on 01/03/2023 15:07:19 [place], Tiburcio Bash ([id number]) [id number] Page 6 of 7 -------------------------------------------------------------------------------- Wound Assessment Details Patient Name: Date of Service: [name], [name] 01/03/2023 2:45 PM Medical Record Number: [id number] Patient Account Number: [id number] Date of Birth/Sex: Treating RN: 1965-07-07 (57 y.o. [name] Primary Care Joretta Eads: Baruch Goldmann Other Clinician: Referring Tawanna Funk: Treating Ariyonna Twichell/Extender: [name] Weeks in Treatment: 10 Wound Status Wound Number: 4 Primary Diabetic Wound/Ulcer of the Lower Extremity Etiology: Wound Location: Left, Anterior Lower Leg Secondary Venous Leg Ulcer Wounding Event: Gradually Appeared Etiology: Date Acquired: 08/10/2022 Wound Open Weeks Of Treatment: 10 Status: Clustered Wound: No Comorbid Lymphedema, Sleep Apnea, Arrhythmia, Congestive Heart Failure, History: Hypertension, Peripheral Arterial Disease, Type II Diabetes, End Stage Renal Disease Photos Wound Measurements Length: (cm) 10.4 Width: (cm) 3.2 Depth: (cm) 0.1 Area: (cm) 26. Volume: (cm) 2.6 % Reduction in Area: -195.8% % Reduction in Volume: -195.7% Epithelialization: None 138 Tunneling: No 14 Undermining: No Wound Description Classification: Grade 1 Wound Margin: Distinct, outline attached Exudate Amount: Medium Exudate Type: Serosanguineous Exudate Color: red, brown Foul Odor After Cleansing: No Slough/Fibrino Yes Wound Bed Granulation Amount: Large (67-100%) Exposed Structure Granulation Quality: Red, Pink Fascia Exposed: No Necrotic Amount: Small (1-33%) Fat Layer (Subcutaneous Tissue) Exposed: Yes Necrotic Quality: Adherent Slough Tendon Exposed: No Muscle Exposed: No Joint Exposed: No Bone Exposed: No Periwound Skin Texture Texture Color No Abnormalities Noted: No No Abnormalities Noted:  No Callus: No Atrophie Blanche: No Crepitus: No Cyanosis: No Excoriation: No Ecchymosis: No Induration: No Erythema: No Rash: No Hemosiderin Staining: Yes Scarring: No Mottled: No Pallor: No Moisture Rubor: No No Abnormalities Noted: No Dry / Scaly: No Maceration: No Treatment Notes Wound #4 (Lower Leg) Wound Laterality: Left, Anterior [place], Day ([id number]) [id number] Page 7 of 7 Cleanser Wound Cleanser Discharge Instruction: Cleanse the wound with wound cleanser prior to applying a clean dressing using gauze sponges, not tissue or cotton balls. Peri-Wound Care Sween Lotion (Moisturizing lotion) Discharge Instruction: Apply moisturizing lotion as directed Topical Primary Dressing IODOFLEX 0.9% Cadexomer Iodine Pad 4x6 cm Discharge Instruction: Apply to wound bed as instructed Secondary Dressing T Non-Adherent Dressing, 3x4 in elfa Discharge Instruction: Apply over primary dressing as directed. Secured With Compression Wrap Urgo K2 Lite, (equivalent to a 3 layer) two layer compression system, regular Discharge Instruction: Apply Urgo K2 Lite as directed (alternative to 3 layer compression). Compression Stockings Add-Ons Electronic Signature(s) Signed: 01/07/2023 2:03:22 PM By: Brenton Grills Entered By: Brenton Grills on 01/03/2023 15:03:33 -------------------------------------------------------------------------------- Vitals Details Patient Name: Date of Service: [name], [name] 01/03/2023 2:45 PM Medical Record Number: [id number] Patient Account Number: [id number] Date of Birth/Sex: Treating RN: 1965-07-10 (57 y.o. [name] Primary Care Kristeena Meineke: Baruch Goldmann Other Clinician: Referring Teletha Petrea: Treating Kirstan Fentress/Extender: [name] Weeks in Treatment: 10 Vital Signs Time Taken: 14:54 Temperature (F): 98.1 Pulse (bpm): 82 Respiratory Rate (breaths/min): 18 Blood Pressure (mmHg): 178/86 Capillary  Blood Glucose (mg/dl): 831 Reference Range: 80 - 120 mg / dl Electronic Signature(s) Signed: 01/07/2023 2:03:22 PM By: Brenton Grills Entered By: Brenton Grills on 01/03/2023 15:32:30  Deneda Other Clinician: Referring Physician: Treating Physician/Extender: [name] in Treatment: 10 Education Assessment Education Provided To: Patient and Caregiver Education Topics Provided Wound/Skin Impairment: Methods: Explain/Verbal Responses: State content correctly Electronic Signature(s) Signed: 01/07/2023 2:03:22 PM By:  Brenton Grills Entered By: Brenton Grills on 01/03/2023 15:07:19 [place], Tiburcio Bash ([id number]) [id number] Page 6 of 7 -------------------------------------------------------------------------------- Wound Assessment Details Patient Name: Date of Service: [name], [name] 01/03/2023 2:45 PM Medical Record Number: [id number] Patient Account Number: [id number] Date of Birth/Sex: Treating RN: 1965-07-07 (57 y.o. [name] Primary Care Joretta Eads: Baruch Goldmann Other Clinician: Referring Tawanna Funk: Treating Ariyonna Twichell/Extender: [name] Weeks in Treatment: 10 Wound Status Wound Number: 4 Primary Diabetic Wound/Ulcer of the Lower Extremity Etiology: Wound Location: Left, Anterior Lower Leg Secondary Venous Leg Ulcer Wounding Event: Gradually Appeared Etiology: Date Acquired: 08/10/2022 Wound Open Weeks Of Treatment: 10 Status: Clustered Wound: No Comorbid Lymphedema, Sleep Apnea, Arrhythmia, Congestive Heart Failure, History: Hypertension, Peripheral Arterial Disease, Type II Diabetes, End Stage Renal Disease Photos Wound Measurements Length: (cm) 10.4 Width: (cm) 3.2 Depth: (cm) 0.1 Area: (cm) 26. Volume: (cm) 2.6 % Reduction in Area: -195.8% % Reduction in Volume: -195.7% Epithelialization: None 138 Tunneling: No 14 Undermining: No Wound Description Classification: Grade 1 Wound Margin: Distinct, outline attached Exudate Amount: Medium Exudate Type: Serosanguineous Exudate Color: red, brown Foul Odor After Cleansing: No Slough/Fibrino Yes Wound Bed Granulation Amount: Large (67-100%) Exposed Structure Granulation Quality: Red, Pink Fascia Exposed: No Necrotic Amount: Small (1-33%) Fat Layer (Subcutaneous Tissue) Exposed: Yes Necrotic Quality: Adherent Slough Tendon Exposed: No Muscle Exposed: No Joint Exposed: No Bone Exposed: No Periwound Skin Texture Texture Color No Abnormalities Noted: No No Abnormalities Noted:  No Callus: No Atrophie Blanche: No Crepitus: No Cyanosis: No Excoriation: No Ecchymosis: No Induration: No Erythema: No Rash: No Hemosiderin Staining: Yes Scarring: No Mottled: No Pallor: No Moisture Rubor: No No Abnormalities Noted: No Dry / Scaly: No Maceration: No Treatment Notes Wound #4 (Lower Leg) Wound Laterality: Left, Anterior [place], Day ([id number]) [id number] Page 7 of 7 Cleanser Wound Cleanser Discharge Instruction: Cleanse the wound with wound cleanser prior to applying a clean dressing using gauze sponges, not tissue or cotton balls. Peri-Wound Care Sween Lotion (Moisturizing lotion) Discharge Instruction: Apply moisturizing lotion as directed Topical Primary Dressing IODOFLEX 0.9% Cadexomer Iodine Pad 4x6 cm Discharge Instruction: Apply to wound bed as instructed Secondary Dressing T Non-Adherent Dressing, 3x4 in elfa Discharge Instruction: Apply over primary dressing as directed. Secured With Compression Wrap Urgo K2 Lite, (equivalent to a 3 layer) two layer compression system, regular Discharge Instruction: Apply Urgo K2 Lite as directed (alternative to 3 layer compression). Compression Stockings Add-Ons Electronic Signature(s) Signed: 01/07/2023 2:03:22 PM By: Brenton Grills Entered By: Brenton Grills on 01/03/2023 15:03:33 -------------------------------------------------------------------------------- Vitals Details Patient Name: Date of Service: [name], [name] 01/03/2023 2:45 PM Medical Record Number: [id number] Patient Account Number: [id number] Date of Birth/Sex: Treating RN: 1965-07-10 (57 y.o. [name] Primary Care Kristeena Meineke: Baruch Goldmann Other Clinician: Referring Teletha Petrea: Treating Kirstan Fentress/Extender: [name] Weeks in Treatment: 10 Vital Signs Time Taken: 14:54 Temperature (F): 98.1 Pulse (bpm): 82 Respiratory Rate (breaths/min): 18 Blood Pressure (mmHg): 178/86 Capillary  Blood Glucose (mg/dl): 831 Reference Range: 80 - 120 mg / dl Electronic Signature(s) Signed: 01/07/2023 2:03:22 PM By: Brenton Grills Entered By: Brenton Grills on 01/03/2023 15:32:30  N/A Comorbid History: Arrhythmia, Congestive Heart Failure, Hypertension, Peripheral Arterial Disease, Type II Diabetes, End Stage Renal Disease 08/10/2022 N/A N/A Date Acquired: 10 N/A N/A Weeks of Treatment: Open N/A N/A Wound Status: No N/A N/A Wound Recurrence: 10.4x3.2x0.1 N/A N/A Measurements L x W x D (cm) 26.138 N/A N/A A (cm) : rea 2.614 N/A N/A Volume (cm) : -195.80% N/A N/A % Reduction in A rea: -195.70% N/A N/A % Reduction in Volume: Grade 1 N/A N/A Classification: Medium N/A N/A Exudate A mount: Serosanguineous N/A N/A Exudate Type: red, brown N/A N/A Exudate Color: Distinct, outline attached N/A N/A Wound Margin: Large (67-100%) N/A N/A Granulation A mount: Red, Pink N/A N/A Granulation Quality: Small (1-33%) N/A N/A Necrotic A mount: Fat Layer (Subcutaneous Tissue): Yes N/A N/A Exposed Structures: Fascia: No Tendon: No Muscle: No Joint: No Bone: No None N/A N/A Epithelialization: Excoriation: No N/A N/A Periwound Skin Texture: Induration: No Callus: No Crepitus: No Rash: No Scarring: No Maceration: No N/A N/A Periwound Skin  Moisture: Dry/Scaly: No Hemosiderin Staining: Yes N/A N/A Periwound Skin Color: Atrophie Blanche: No Cyanosis: No Ecchymosis: No Erythema: No Mottled: No Pallor: No Rubor: No Treatment Notes Electronic Signature(s) Signed: 01/04/2023 9:31:55 AM By: Baltazar Najjar MD Entered By: Baltazar Najjar on 01/03/2023 15:18:27 [place], Tiburcio Bash ([id number]) [id number] Page 4 of 7 -------------------------------------------------------------------------------- Multi-Disciplinary Care Plan Details Patient Name: Date of Service: [name], [name] 01/03/2023 2:45 PM Medical Record Number: [id number] Patient Account Number: [id number] Date of Birth/Sex: Treating RN: [birth year] (57 y.o. [name] Primary Care Matsuko Kretz: Baruch Goldmann Other Clinician: Referring Svara Twyman: Treating Bohdi Leeds/Extender: [name] Weeks in Treatment: 10 Active Inactive Necrotic Tissue Nursing Diagnoses: Impaired tissue integrity related to necrotic/devitalized tissue Goals: Necrotic/devitalized tissue will be minimized in the wound bed Date Initiated: 10/25/2022 Target Resolution Date: 01/04/2023 Goal Status: Active Interventions: Assess patient pain level pre-, during and post procedure and prior to discharge Treatment Activities: Enzymatic debridement : 10/25/2022 T ordered outside of clinic : 10/25/2022 est Notes: Wound/Skin Impairment Nursing Diagnoses: Knowledge deficit related to ulceration/compromised skin integrity Goals: Patient/caregiver will verbalize understanding of skin care regimen Date Initiated: 10/25/2022 Target Resolution Date: 01/24/2023 Goal Status: Active Ulcer/skin breakdown will heal within 14 weeks Date Initiated: 10/25/2022 Date Inactivated: 12/06/2022 Target Resolution Date: 11/16/2022 Goal Status: Unmet Unmet Reason: non compliance Interventions: Assess patient/caregiver ability to perform ulcer/skin care regimen upon admission and as  needed Assess ulceration(s) every visit Provide education on ulcer and skin care Treatment Activities: Skin care regimen initiated : 10/25/2022 Topical wound management initiated : 10/25/2022 Notes: Electronic Signature(s) Signed: 01/07/2023 2:03:22 PM By: Brenton Grills Entered By: Brenton Grills on 01/03/2023 15:06:57 -------------------------------------------------------------------------------- Pain Assessment Details Patient Name: Date of Service: [name], [name] 01/03/2023 2:45 PM Medical Record Number: [id number] Patient Account Number: [id number] Date of Birth/Sex: Treating RN: 17-Mar-1966 (57 y.o. [name] Primary Care Chief Walkup: Baruch Goldmann Other Clinician: Referring Willye Javier: Treating Margi Edmundson/Extender: [name] in Treatment: 10 [place], Tiburcio Bash ([id number]) [id number] Page 5 of 7 Active Problems Location of Pain Severity and Description of Pain Patient Has Paino Yes Site Locations Rate the pain. Current Pain Level: 8 Pain Management and Medication Current Pain Management: Electronic Signature(s) Signed: 01/07/2023 2:03:22 PM By: Brenton Grills Entered By: Brenton Grills on 01/03/2023 14:57:00 -------------------------------------------------------------------------------- Patient/Caregiver Education Details Patient Name: Date of Service: [name], [name] 9/26/2024andnbsp2:45 PM Medical Record Number: [id number] Patient Account Number: [id number] Date of Birth/Gender: Treating RN: [birth year] (57 y.o. [name] Primary Care Physician: Melina Fiddler,

## 2023-01-08 NOTE — Progress Notes (Addendum)
Venne, Jaquon ([id number]) [id number] Page 1 of 8 Visit Report for 01/03/2023 HPI Details Patient Name: Date of Service: [name], [name] 01/03/2023 2:45 PM Medical Record Number: [id number] Patient Account Number: [id number] Date of Birth/Sex: Treating RN: 12-14-1965 (57 y.o. M) Primary Care Provider: Baruch Goldmann Other Clinician: Referring Provider: Treating Provider/Extender: [name] Weeks in Treatment: 10 History of Present Illness HPI Description: ADMISSION 08/04/2019 This is a 57 year old man who traumatized his right lateral lower leg while going up an escalator at the mall sometime in December 2020. He was referred to vascular surgery and on 04/14/2019 he saw Dr. Raynelle Fanning. He was felt to have "palpable pulses and normal noninvasive studies" although I cannot see these results. He was back in his primary doctor's office on 07/30/2019 for a nonhealing right lower extremity wound. He was referred to wound care I think in [facility] but his insurance was out of network. A CNS was ordered. X-ray was done that was negative. He has been using wound cleanser and a Band-Aid. Past medical history is actually quite extensive and includes chronic kidney disease stage IV, chronic diastolic heart failure, atrial fibrillation, poorly controlled hypertension, poorly controlled diabetes with peripheral neuropathy and a recent hemoglobin A1c of 11.5, depression, lymphedema and sleep apnea ABI in our clinic was 1.1 in the right 5/6; this wound was initially traumatized while the patient was going down on an escalator. Arrived in clinic last week with a completely nonviable surface. He required mechanical debridement and we have been using Iodoflex under compression. Apparently the compression wraps fell down. He also has poorly controlled diabetes 5/15; traumatic/contusion wound to the right lateral lower leg. We have been using Iodoflex to clean at the  surface of this which was completely nonviable along with mechanical debridements. He is under compression. 09/02/2019 upon evaluation today patient appears to be doing well with regard to his wound on his right lateral leg. He has been tolerating the dressing changes without complication. Fortunately there is no signs of active infection at this time. No fevers, chills, nausea, vomiting, or diarrhea. With that being said there is some necrotic tissue around the edges of the wound that is and require some sharp debridement today. That was discussed with the patient and his mother in the office today prior to debridement to which she did consent. 09/09/2019 upon evaluation today patient appears to be making progress in regard to his wound. Fortunately the wound is not nearly as deep as what it was. It is measuring a little bit larger due to having to clear some of the edges away but again that is necessary to get this to heal. Fortunately there is no evidence of active infection at this time. No fevers, chills, nausea, vomiting, or diarrhea. 09/16/2019 on evaluation today patient appears to be doing excellent in regard to his wound currently. In fact I do not see anything that we can need to debride today which is great news. The wound bed looks healthy and the edges of the wound look like they are doing great. Overall I feel like we are at a good place and headed in the proper direction. 09/23/2019 upon evaluation today patient appears to be doing okay in regard to his wound. This is not measuring any smaller it is about the same. With that being said he tells me that his wrap actually slid down he ended up having to take this off he was on a trip and did not have his Velcro  with wound cleanser prior to applying a clean dressing using gauze sponges, not tissue or cotton balls. Kensinger, Garrin ([id number]) [id number] Page 4 of 8 Peri-Wound Care: Sween Lotion (Moisturizing lotion) 1 x Per Week/30 Days Discharge Instructions: Apply moisturizing lotion as directed Prim Dressing: IODOFLEX 0.9% Cadexomer Iodine Pad 4x6 cm 1 x Per Week/30 Days ary Discharge Instructions: Apply to wound bed as instructed Secondary Dressing: T Non-Adherent Dressing, 3x4 in 1 x Per Week/30 Days elfa Discharge Instructions: Apply over primary dressing as directed. Compression Wrap: Urgo K2 Lite, (equivalent to a 3 layer) two layer compression system, regular 1 x Per Week/30 Days Discharge Instructions: Apply Urgo K2 Lite as directed (alternative to 3 layer compression). Electronic Signature(s) Signed: 01/04/2023 9:31:55 AM By: Baltazar Najjar MD Signed: 01/07/2023 2:03:22 PM By: Brenton Grills Entered By: Brenton Grills on 01/03/2023 15:13:46 -------------------------------------------------------------------------------- Problem List Details Patient Name: Date of Service: [name], [name] 01/03/2023 2:45 PM Medical Record Number: [id number] Patient Account Number: [id number] Date of Birth/Sex: Treating RN: February 25, 1966 (57 y.o. [name] Primary Care Provider: Baruch Goldmann Other  Clinician: Referring Provider: Treating Provider/Extender: [name] Weeks in Treatment: 10 Active Problems ICD-10 Encounter Code Description Active Date MDM Diagnosis 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 10/25/2022 No Yes severity E11.622 Type 2 diabetes mellitus with other skin ulcer 10/25/2022 No Yes I87.312 Chronic venous hypertension (idiopathic) with ulcer of left lower extremity 12/06/2022 No Yes E11.22 Type 2 diabetes mellitus with diabetic chronic kidney disease 10/25/2022 No Yes R09.02 Hypoxemia 10/25/2022 No Yes N18.6 End stage renal disease 12/18/2022 No Yes Inactive Problems Resolved Problems Electronic Signature(s) Signed: 01/04/2023 9:31:55 AM By: Baltazar Najjar MD Beidler, Jerame ([id number]) [id number] Page 5 of 8 Entered By: Baltazar Najjar on 01/03/2023 15:18:18 -------------------------------------------------------------------------------- Progress Note Details Patient Name: Date of Service: [name], [name] 01/03/2023 2:45 PM Medical Record Number: [id number] Patient Account Number: [id number] Date of Birth/Sex: Treating RN: 21-Apr-1965 (57 y.o. M) Primary Care Provider: Baruch Goldmann Other Clinician: Referring Provider: Treating Provider/Extender: [name] Weeks in Treatment: 10 Subjective History of Present Illness (HPI) ADMISSION 08/04/2019 This is a 57 year old man who traumatized his right lateral lower leg while going up an escalator at the mall sometime in December 2020. He was referred to vascular surgery and on 04/14/2019 he saw Dr. Raynelle Fanning. He was felt to have "palpable pulses and normal noninvasive studies" although I cannot see these results. He was back in his primary doctor's office on 07/30/2019 for a nonhealing right lower extremity wound. He was referred to wound care I think in [facility] but his insurance was out of network. A CNS  was ordered. X-ray was done that was negative. He has been using wound cleanser and a Band-Aid. Past medical history is actually quite extensive and includes chronic kidney disease stage IV, chronic diastolic heart failure, atrial fibrillation, poorly controlled hypertension, poorly controlled diabetes with peripheral neuropathy and a recent hemoglobin A1c of 11.5, depression, lymphedema and sleep apnea ABI in our clinic was 1.1 in the right 5/6; this wound was initially traumatized while the patient was going down on an escalator. Arrived in clinic last week with a completely nonviable surface. He required mechanical debridement and we have been using Iodoflex under compression. Apparently the compression wraps fell down. He also has poorly controlled diabetes 5/15; traumatic/contusion wound to the right lateral lower leg. We have been using Iodoflex to clean at the surface of this which was  Venne, Jaquon ([id number]) [id number] Page 1 of 8 Visit Report for 01/03/2023 HPI Details Patient Name: Date of Service: [name], [name] 01/03/2023 2:45 PM Medical Record Number: [id number] Patient Account Number: [id number] Date of Birth/Sex: Treating RN: 12-14-1965 (57 y.o. M) Primary Care Provider: Baruch Goldmann Other Clinician: Referring Provider: Treating Provider/Extender: [name] Weeks in Treatment: 10 History of Present Illness HPI Description: ADMISSION 08/04/2019 This is a 57 year old man who traumatized his right lateral lower leg while going [place] an [place] the mall sometime in December 2020. He was referred to vascular surgery and on 04/14/2019 he saw Dr. Raynelle Fanning. He was felt to have "palpable pulses and normal noninvasive studies" although I cannot see these results. He was back in his primary doctor's office on 07/30/2019 for a nonhealing right lower extremity wound. He was referred to wound care I think in [facility] but his insurance was out of network. A CNS was ordered. X-ray was done that was negative. He has been using wound cleanser and a Band-Aid. Past medical history is actually quite extensive and includes chronic kidney disease stage IV, chronic diastolic heart failure, atrial fibrillation, poorly controlled hypertension, poorly controlled diabetes with peripheral neuropathy and a recent hemoglobin A1c of 11.5, depression, lymphedema and sleep apnea ABI in our clinic was 1.1 in the right 5/6; this wound was initially traumatized while the patient was going down on an escalator. Arrived in clinic last week with a completely nonviable surface. He required mechanical debridement and we have been using Iodoflex under compression. Apparently the compression wraps fell down. He also has poorly controlled diabetes 5/15; traumatic/contusion wound to the right lateral lower leg. We have been using Iodoflex to clean at the  surface of this which was completely nonviable along with mechanical debridements. He is under compression. 09/02/2019 upon evaluation today patient appears to be doing well with regard to his wound on his right lateral leg. He has been tolerating the dressing changes without complication. Fortunately there is no signs of active infection at this time. No fevers, chills, nausea, vomiting, or diarrhea. With that being said there is some necrotic tissue around the edges of the wound that is and require some sharp debridement today. That was discussed with the patient and his mother in the office today prior to debridement to which she did consent. 09/09/2019 upon evaluation today patient appears to be making progress in regard to his wound. Fortunately the wound is not nearly as deep as what it was. It is measuring a little bit larger due to having to clear some of the edges away but again that is necessary to get this to heal. Fortunately there is no evidence of active infection at this time. No fevers, chills, nausea, vomiting, or diarrhea. 09/16/2019 on evaluation today patient appears to be doing excellent in regard to his wound currently. In fact I do not see anything that we can need to debride today which is great news. The wound bed looks healthy and the edges of the wound look like they are doing great. Overall I feel like we are at a good place and headed in the proper direction. 09/23/2019 upon evaluation today patient appears to be doing okay in regard to his wound. This is not measuring any smaller it is about the same. With that being said he tells me that his wrap actually slid down he ended up having to take this off he was on a trip and did not have his Velcro  Remarkable improvement in the wound bed surface with better healthy looking granulation and advancing epithelialization. There is no evidence of surrounding infection Integumentary (Hair, Skin) Wound #4 status is Open. Original cause of wound was Gradually Appeared. The date acquired was: 08/10/2022. The wound has been in treatment 10 weeks. The wound is located on the Left,Anterior Lower Leg. The wound measures 10.4cm length x 3.2cm width x 0.1cm depth; 26.138cm^2 area and 2.614cm^3 volume. There is Fat Layer (Subcutaneous Tissue) exposed. There is no tunneling or undermining noted. There is a medium amount of serosanguineous drainage noted. The wound margin is distinct with the outline attached to  the wound base. There is large (67-100%) red, pink granulation within the wound bed. There is a small (1-33%) amount of necrotic tissue within the wound bed including Adherent Slough. The periwound skin appearance exhibited: Hemosiderin Staining. The periwound skin appearance did not exhibit: Callus, Crepitus, Excoriation, Induration, Rash, Scarring, Dry/Scaly, Maceration, Atrophie Blanche, Cyanosis, Ecchymosis, Mottled, Pallor, Rubor, Erythema. 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 - w/ Dr. Leanord Hawking Please make appt. Anesthetic: (In clinic) Topical Lidocaine 5% applied to wound bed Bathing/ Shower/ Hygiene: May shower with protection but do not get wound dressing(s) wet. Protect dressing(s) with water repellant cover (for example, large plastic bag) or a cast cover and may then take shower. - may purchase cast protector from [facility], [facility] or [employer] Edema Control - Lymphedema / SCD / Other: Elevate legs to the level of the heart or above for 30 minutes daily and/or when sitting for 3-4 times a day throughout the day. Avoid standing for long periods of time. Exercise regularly Moisturize legs daily. Additional Orders / Instructions: Follow Nutritious Diet - closely monitor blood sugar. increase protein levels. WOUND #4: - Lower Leg Wound Laterality: Left, Anterior Cleanser: Wound Cleanser (Generic) 1 x Per Week/30 Days Discharge Instructions: Cleanse the wound with wound cleanser prior to applying a clean dressing using gauze sponges, not tissue or cotton balls. Peri-Wound Care: Sween Lotion (Moisturizing lotion) 1 x Per Week/30  Days Discharge Instructions: Apply moisturizing lotion as directed Prim Dressing: IODOFLEX 0.9% Cadexomer Iodine Pad 4x6 cm 1 x Per Week/30 Days ary Discharge Instructions: Apply to wound bed as instructed Secondary Dressing: T Non-Adherent Dressing, 3x4 in 1 x Per Week/30 Days elfa Discharge Instructions: Apply over primary dressing as directed. Com pression Wrap: Urgo K2 Lite, (equivalent to a 3 layer) two layer compression system, regular 1 x Per Week/30 Days Discharge Instructions: Apply Urgo K2 Lite as directed (alternative to 3 layer compression). 1. Continue the Iodoflex. successful surface wound debridement 2 marked improvement from last week 3No evidence of infection Electronic Signature(s) Signed: 01/07/2023 8:38:05 AM By: Shawn Stall RN, BSN Signed: 01/08/2023 7:25:57 AM By: Baltazar Najjar MD Previous Signature: 01/04/2023 9:31:55 AM Version By: Baltazar Najjar MD Entered By: Shawn Stall on 01/07/2023 08:32:30 Ishee, Tyrelle ([id number]) [id number] Page 8 of 8 -------------------------------------------------------------------------------- SuperBill Details Patient Name: Date of Service: [name], [name] 01/03/2023 Medical Record Number: [id number] Patient Account Number: [id number] Date of Birth/Sex: Treating RN: 1966-03-15 (57 y.o. M) Primary Care Provider: Baruch Goldmann Other Clinician: Referring Provider: Treating Provider/Extender: [name] Weeks in Treatment: 10 Diagnosis Coding ICD-10 Codes  Remarkable improvement in the wound bed surface with better healthy looking granulation and advancing epithelialization. There is no evidence of surrounding infection Integumentary (Hair, Skin) Wound #4 status is Open. Original cause of wound was Gradually Appeared. The date acquired was: 08/10/2022. The wound has been in treatment 10 weeks. The wound is located on the Left,Anterior Lower Leg. The wound measures 10.4cm length x 3.2cm width x 0.1cm depth; 26.138cm^2 area and 2.614cm^3 volume. There is Fat Layer (Subcutaneous Tissue) exposed. There is no tunneling or undermining noted. There is a medium amount of serosanguineous drainage noted. The wound margin is distinct with the outline attached to  the wound base. There is large (67-100%) red, pink granulation within the wound bed. There is a small (1-33%) amount of necrotic tissue within the wound bed including Adherent Slough. The periwound skin appearance exhibited: Hemosiderin Staining. The periwound skin appearance did not exhibit: Callus, Crepitus, Excoriation, Induration, Rash, Scarring, Dry/Scaly, Maceration, Atrophie Blanche, Cyanosis, Ecchymosis, Mottled, Pallor, Rubor, Erythema. 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 - w/ Dr. Leanord Hawking Please make appt. Anesthetic: (In clinic) Topical Lidocaine 5% applied to wound bed Bathing/ Shower/ Hygiene: May shower with protection but do not get wound dressing(s) wet. Protect dressing(s) with water repellant cover (for example, large plastic bag) or a cast cover and may then take shower. - may purchase cast protector from [facility], [facility] or [employer] Edema Control - Lymphedema / SCD / Other: Elevate legs to the level of the heart or above for 30 minutes daily and/or when sitting for 3-4 times a day throughout the day. Avoid standing for long periods of time. Exercise regularly Moisturize legs daily. Additional Orders / Instructions: Follow Nutritious Diet - closely monitor blood sugar. increase protein levels. WOUND #4: - Lower Leg Wound Laterality: Left, Anterior Cleanser: Wound Cleanser (Generic) 1 x Per Week/30 Days Discharge Instructions: Cleanse the wound with wound cleanser prior to applying a clean dressing using gauze sponges, not tissue or cotton balls. Peri-Wound Care: Sween Lotion (Moisturizing lotion) 1 x Per Week/30  Days Discharge Instructions: Apply moisturizing lotion as directed Prim Dressing: IODOFLEX 0.9% Cadexomer Iodine Pad 4x6 cm 1 x Per Week/30 Days ary Discharge Instructions: Apply to wound bed as instructed Secondary Dressing: T Non-Adherent Dressing, 3x4 in 1 x Per Week/30 Days elfa Discharge Instructions: Apply over primary dressing as directed. Com pression Wrap: Urgo K2 Lite, (equivalent to a 3 layer) two layer compression system, regular 1 x Per Week/30 Days Discharge Instructions: Apply Urgo K2 Lite as directed (alternative to 3 layer compression). 1. Continue the Iodoflex. successful surface wound debridement 2 marked improvement from last week 3No evidence of infection Electronic Signature(s) Signed: 01/07/2023 8:38:05 AM By: Shawn Stall RN, BSN Signed: 01/08/2023 7:25:57 AM By: Baltazar Najjar MD Previous Signature: 01/04/2023 9:31:55 AM Version By: Baltazar Najjar MD Entered By: Shawn Stall on 01/07/2023 08:32:30 Ishee, Tyrelle ([id number]) [id number] Page 8 of 8 -------------------------------------------------------------------------------- SuperBill Details Patient Name: Date of Service: [name], [name] 01/03/2023 Medical Record Number: [id number] Patient Account Number: [id number] Date of Birth/Sex: Treating RN: 1966-03-15 (57 y.o. M) Primary Care Provider: Baruch Goldmann Other Clinician: Referring Provider: Treating Provider/Extender: [name] Weeks in Treatment: 10 Diagnosis Coding ICD-10 Codes  Remarkable improvement in the wound bed surface with better healthy looking granulation and advancing epithelialization. There is no evidence of surrounding infection Integumentary (Hair, Skin) Wound #4 status is Open. Original cause of wound was Gradually Appeared. The date acquired was: 08/10/2022. The wound has been in treatment 10 weeks. The wound is located on the Left,Anterior Lower Leg. The wound measures 10.4cm length x 3.2cm width x 0.1cm depth; 26.138cm^2 area and 2.614cm^3 volume. There is Fat Layer (Subcutaneous Tissue) exposed. There is no tunneling or undermining noted. There is a medium amount of serosanguineous drainage noted. The wound margin is distinct with the outline attached to  the wound base. There is large (67-100%) red, pink granulation within the wound bed. There is a small (1-33%) amount of necrotic tissue within the wound bed including Adherent Slough. The periwound skin appearance exhibited: Hemosiderin Staining. The periwound skin appearance did not exhibit: Callus, Crepitus, Excoriation, Induration, Rash, Scarring, Dry/Scaly, Maceration, Atrophie Blanche, Cyanosis, Ecchymosis, Mottled, Pallor, Rubor, Erythema. 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 - w/ Dr. Leanord Hawking Please make appt. Anesthetic: (In clinic) Topical Lidocaine 5% applied to wound bed Bathing/ Shower/ Hygiene: May shower with protection but do not get wound dressing(s) wet. Protect dressing(s) with water repellant cover (for example, large plastic bag) or a cast cover and may then take shower. - may purchase cast protector from [facility], [facility] or [employer] Edema Control - Lymphedema / SCD / Other: Elevate legs to the level of the heart or above for 30 minutes daily and/or when sitting for 3-4 times a day throughout the day. Avoid standing for long periods of time. Exercise regularly Moisturize legs daily. Additional Orders / Instructions: Follow Nutritious Diet - closely monitor blood sugar. increase protein levels. WOUND #4: - Lower Leg Wound Laterality: Left, Anterior Cleanser: Wound Cleanser (Generic) 1 x Per Week/30 Days Discharge Instructions: Cleanse the wound with wound cleanser prior to applying a clean dressing using gauze sponges, not tissue or cotton balls. Peri-Wound Care: Sween Lotion (Moisturizing lotion) 1 x Per Week/30  Days Discharge Instructions: Apply moisturizing lotion as directed Prim Dressing: IODOFLEX 0.9% Cadexomer Iodine Pad 4x6 cm 1 x Per Week/30 Days ary Discharge Instructions: Apply to wound bed as instructed Secondary Dressing: T Non-Adherent Dressing, 3x4 in 1 x Per Week/30 Days elfa Discharge Instructions: Apply over primary dressing as directed. Com pression Wrap: Urgo K2 Lite, (equivalent to a 3 layer) two layer compression system, regular 1 x Per Week/30 Days Discharge Instructions: Apply Urgo K2 Lite as directed (alternative to 3 layer compression). 1. Continue the Iodoflex. successful surface wound debridement 2 marked improvement from last week 3No evidence of infection Electronic Signature(s) Signed: 01/07/2023 8:38:05 AM By: Shawn Stall RN, BSN Signed: 01/08/2023 7:25:57 AM By: Baltazar Najjar MD Previous Signature: 01/04/2023 9:31:55 AM Version By: Baltazar Najjar MD Entered By: Shawn Stall on 01/07/2023 08:32:30 Ishee, Tyrelle ([id number]) [id number] Page 8 of 8 -------------------------------------------------------------------------------- SuperBill Details Patient Name: Date of Service: [name], [name] 01/03/2023 Medical Record Number: [id number] Patient Account Number: [id number] Date of Birth/Sex: Treating RN: 1966-03-15 (57 y.o. M) Primary Care Provider: Baruch Goldmann Other Clinician: Referring Provider: Treating Provider/Extender: [name] Weeks in Treatment: 10 Diagnosis Coding ICD-10 Codes  Venne, Jaquon ([id number]) [id number] Page 1 of 8 Visit Report for 01/03/2023 HPI Details Patient Name: Date of Service: [name], [name] 01/03/2023 2:45 PM Medical Record Number: [id number] Patient Account Number: [id number] Date of Birth/Sex: Treating RN: 12-14-1965 (57 y.o. M) Primary Care Provider: Baruch Goldmann Other Clinician: Referring Provider: Treating Provider/Extender: [name] Weeks in Treatment: 10 History of Present Illness HPI Description: ADMISSION 08/04/2019 This is a 57 year old man who traumatized his right lateral lower leg while going [place] escal[place] at the mall sometime in December 2020. He was referred to vascular surgery and on 04/14/2019 he saw Dr. Raynelle Fanning. He was felt to have "palpable pulses and normal noninvasive studies" although I cannot see these results. He was back in his primary doctor's office on 07/30/2019 for a nonhealing right lower extremity wound. He was referred to wound care I think in [facility] but his insurance was out of network. A CNS was ordered. X-ray was done that was negative. He has been using wound cleanser and a Band-Aid. Past medical history is actually quite extensive and includes chronic kidney disease stage IV, chronic diastolic heart failure, atrial fibrillation, poorly controlled hypertension, poorly controlled diabetes with peripheral neuropathy and a recent hemoglobin A1c of 11.5, depression, lymphedema and sleep apnea ABI in our clinic was 1.1 in the right 5/6; this wound was initially traumatized while the patient was going down on an escalator. Arrived in clinic last week with a completely nonviable surface. He required mechanical debridement and we have been using Iodoflex under compression. Apparently the compression wraps fell down. He also has poorly controlled diabetes 5/15; traumatic/contusion wound to the right lateral lower leg. We have been using Iodoflex to clean at the  surface of this which was completely nonviable along with mechanical debridements. He is under compression. 09/02/2019 upon evaluation today patient appears to be doing well with regard to his wound on his right lateral leg. He has been tolerating the dressing changes without complication. Fortunately there is no signs of active infection at this time. No fevers, chills, nausea, vomiting, or diarrhea. With that being said there is some necrotic tissue around the edges of the wound that is and require some sharp debridement today. That was discussed with the patient and his mother in the office today prior to debridement to which she did consent. 09/09/2019 upon evaluation today patient appears to be making progress in regard to his wound. Fortunately the wound is not nearly as deep as what it was. It is measuring a little bit larger due to having to clear some of the edges away but again that is necessary to get this to heal. Fortunately there is no evidence of active infection at this time. No fevers, chills, nausea, vomiting, or diarrhea. 09/16/2019 on evaluation today patient appears to be doing excellent in regard to his wound currently. In fact I do not see anything that we can need to debride today which is great news. The wound bed looks healthy and the edges of the wound look like they are doing great. Overall I feel like we are at a good place and headed in the proper direction. 09/23/2019 upon evaluation today patient appears to be doing okay in regard to his wound. This is not measuring any smaller it is about the same. With that being said he tells me that his wrap actually slid down he ended up having to take this off he was on a trip and did not have his Velcro  Venne, Jaquon ([id number]) [id number] Page 1 of 8 Visit Report for 01/03/2023 HPI Details Patient Name: Date of Service: [name], [name] 01/03/2023 2:45 PM Medical Record Number: [id number] Patient Account Number: [id number] Date of Birth/Sex: Treating RN: 12-14-1965 (57 y.o. M) Primary Care Provider: Baruch Goldmann Other Clinician: Referring Provider: Treating Provider/Extender: [name] Weeks in Treatment: 10 History of Present Illness HPI Description: ADMISSION 08/04/2019 This is a 57 year old man who traumatized his right lateral lower leg while going up an escalator at the mall sometime in December 2020. He was referred to vascular surgery and on 04/14/2019 he saw Dr. Raynelle Fanning. He was felt to have "palpable pulses and normal noninvasive studies" although I cannot see these results. He was back in his primary doctor's office on 07/30/2019 for a nonhealing right lower extremity wound. He was referred to wound care I think in [facility] but his insurance was out of network. A CNS was ordered. X-ray was done that was negative. He has been using wound cleanser and a Band-Aid. Past medical history is actually quite extensive and includes chronic kidney disease stage IV, chronic diastolic heart failure, atrial fibrillation, poorly controlled hypertension, poorly controlled diabetes with peripheral neuropathy and a recent hemoglobin A1c of 11.5, depression, lymphedema and sleep apnea ABI in our clinic was 1.1 in the right 5/6; this wound was initially traumatized while the patient was going down on an escalator. Arrived in clinic last week with a completely nonviable surface. He required mechanical debridement and we have been using Iodoflex under compression. Apparently the compression wraps fell down. He also has poorly controlled diabetes 5/15; traumatic/contusion wound to the right lateral lower leg. We have been using Iodoflex to clean at the  surface of this which was completely nonviable along with mechanical debridements. He is under compression. 09/02/2019 upon evaluation today patient appears to be doing well with regard to his wound on his right lateral leg. He has been tolerating the dressing changes without complication. Fortunately there is no signs of active infection at this time. No fevers, chills, nausea, vomiting, or diarrhea. With that being said there is some necrotic tissue around the edges of the wound that is and require some sharp debridement today. That was discussed with the patient and his mother in the office today prior to debridement to which she did consent. 09/09/2019 upon evaluation today patient appears to be making progress in regard to his wound. Fortunately the wound is not nearly as deep as what it was. It is measuring a little bit larger due to having to clear some of the edges away but again that is necessary to get this to heal. Fortunately there is no evidence of active infection at this time. No fevers, chills, nausea, vomiting, or diarrhea. 09/16/2019 on evaluation today patient appears to be doing excellent in regard to his wound currently. In fact I do not see anything that we can need to debride today which is great news. The wound bed looks healthy and the edges of the wound look like they are doing great. Overall I feel like we are at a good place and headed in the proper direction. 09/23/2019 upon evaluation today patient appears to be doing okay in regard to his wound. This is not measuring any smaller it is about the same. With that being said he tells me that his wrap actually slid down he ended up having to take this off he was on a trip and did not have his Velcro  with wound cleanser prior to applying a clean dressing using gauze sponges, not tissue or cotton balls. Kensinger, Garrin ([id number]) [id number] Page 4 of 8 Peri-Wound Care: Sween Lotion (Moisturizing lotion) 1 x Per Week/30 Days Discharge Instructions: Apply moisturizing lotion as directed Prim Dressing: IODOFLEX 0.9% Cadexomer Iodine Pad 4x6 cm 1 x Per Week/30 Days ary Discharge Instructions: Apply to wound bed as instructed Secondary Dressing: T Non-Adherent Dressing, 3x4 in 1 x Per Week/30 Days elfa Discharge Instructions: Apply over primary dressing as directed. Compression Wrap: Urgo K2 Lite, (equivalent to a 3 layer) two layer compression system, regular 1 x Per Week/30 Days Discharge Instructions: Apply Urgo K2 Lite as directed (alternative to 3 layer compression). Electronic Signature(s) Signed: 01/04/2023 9:31:55 AM By: Baltazar Najjar MD Signed: 01/07/2023 2:03:22 PM By: Brenton Grills Entered By: Brenton Grills on 01/03/2023 15:13:46 -------------------------------------------------------------------------------- Problem List Details Patient Name: Date of Service: [name], [name] 01/03/2023 2:45 PM Medical Record Number: [id number] Patient Account Number: [id number] Date of Birth/Sex: Treating RN: February 25, 1966 (57 y.o. [name] Primary Care Provider: Baruch Goldmann Other  Clinician: Referring Provider: Treating Provider/Extender: [name] Weeks in Treatment: 10 Active Problems ICD-10 Encounter Code Description Active Date MDM Diagnosis 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 10/25/2022 No Yes severity E11.622 Type 2 diabetes mellitus with other skin ulcer 10/25/2022 No Yes I87.312 Chronic venous hypertension (idiopathic) with ulcer of left lower extremity 12/06/2022 No Yes E11.22 Type 2 diabetes mellitus with diabetic chronic kidney disease 10/25/2022 No Yes R09.02 Hypoxemia 10/25/2022 No Yes N18.6 End stage renal disease 12/18/2022 No Yes Inactive Problems Resolved Problems Electronic Signature(s) Signed: 01/04/2023 9:31:55 AM By: Baltazar Najjar MD Beidler, Jerame ([id number]) [id number] Page 5 of 8 Entered By: Baltazar Najjar on 01/03/2023 15:18:18 -------------------------------------------------------------------------------- Progress Note Details Patient Name: Date of Service: [name], [name] 01/03/2023 2:45 PM Medical Record Number: [id number] Patient Account Number: [id number] Date of Birth/Sex: Treating RN: 21-Apr-1965 (57 y.o. M) Primary Care Provider: Baruch Goldmann Other Clinician: Referring Provider: Treating Provider/Extender: [name] Weeks in Treatment: 10 Subjective History of Present Illness (HPI) ADMISSION 08/04/2019 This is a 57 year old man who traumatized his right lateral lower leg while going [place] an [place]ator at the mall sometime in December 2020. He was referred to vascular surgery and on 04/14/2019 he saw Dr. Raynelle Fanning. He was felt to have "palpable pulses and normal noninvasive studies" although I cannot see these results. He was back in his primary doctor's office on 07/30/2019 for a nonhealing right lower extremity wound. He was referred to wound care I think in [facility] but his insurance was out of network. A CNS  was ordered. X-ray was done that was negative. He has been using wound cleanser and a Band-Aid. Past medical history is actually quite extensive and includes chronic kidney disease stage IV, chronic diastolic heart failure, atrial fibrillation, poorly controlled hypertension, poorly controlled diabetes with peripheral neuropathy and a recent hemoglobin A1c of 11.5, depression, lymphedema and sleep apnea ABI in our clinic was 1.1 in the right 5/6; this wound was initially traumatized while the patient was going down on an escalator. Arrived in clinic last week with a completely nonviable surface. He required mechanical debridement and we have been using Iodoflex under compression. Apparently the compression wraps fell down. He also has poorly controlled diabetes 5/15; traumatic/contusion wound to the right lateral lower leg. We have been using Iodoflex to clean at the surface of this which was  Remarkable improvement in the wound bed surface with better healthy looking granulation and advancing epithelialization. There is no evidence of surrounding infection Integumentary (Hair, Skin) Wound #4 status is Open. Original cause of wound was Gradually Appeared. The date acquired was: 08/10/2022. The wound has been in treatment 10 weeks. The wound is located on the Left,Anterior Lower Leg. The wound measures 10.4cm length x 3.2cm width x 0.1cm depth; 26.138cm^2 area and 2.614cm^3 volume. There is Fat Layer (Subcutaneous Tissue) exposed. There is no tunneling or undermining noted. There is a medium amount of serosanguineous drainage noted. The wound margin is distinct with the outline attached to  the wound base. There is large (67-100%) red, pink granulation within the wound bed. There is a small (1-33%) amount of necrotic tissue within the wound bed including Adherent Slough. The periwound skin appearance exhibited: Hemosiderin Staining. The periwound skin appearance did not exhibit: Callus, Crepitus, Excoriation, Induration, Rash, Scarring, Dry/Scaly, Maceration, Atrophie Blanche, Cyanosis, Ecchymosis, Mottled, Pallor, Rubor, Erythema. 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 - w/ Dr. Leanord Hawking Please make appt. Anesthetic: (In clinic) Topical Lidocaine 5% applied to wound bed Bathing/ Shower/ Hygiene: May shower with protection but do not get wound dressing(s) wet. Protect dressing(s) with water repellant cover (for example, large plastic bag) or a cast cover and may then take shower. - may purchase cast protector from [facility], [facility] or [employer] Edema Control - Lymphedema / SCD / Other: Elevate legs to the level of the heart or above for 30 minutes daily and/or when sitting for 3-4 times a day throughout the day. Avoid standing for long periods of time. Exercise regularly Moisturize legs daily. Additional Orders / Instructions: Follow Nutritious Diet - closely monitor blood sugar. increase protein levels. WOUND #4: - Lower Leg Wound Laterality: Left, Anterior Cleanser: Wound Cleanser (Generic) 1 x Per Week/30 Days Discharge Instructions: Cleanse the wound with wound cleanser prior to applying a clean dressing using gauze sponges, not tissue or cotton balls. Peri-Wound Care: Sween Lotion (Moisturizing lotion) 1 x Per Week/30  Days Discharge Instructions: Apply moisturizing lotion as directed Prim Dressing: IODOFLEX 0.9% Cadexomer Iodine Pad 4x6 cm 1 x Per Week/30 Days ary Discharge Instructions: Apply to wound bed as instructed Secondary Dressing: T Non-Adherent Dressing, 3x4 in 1 x Per Week/30 Days elfa Discharge Instructions: Apply over primary dressing as directed. Com pression Wrap: Urgo K2 Lite, (equivalent to a 3 layer) two layer compression system, regular 1 x Per Week/30 Days Discharge Instructions: Apply Urgo K2 Lite as directed (alternative to 3 layer compression). 1. Continue the Iodoflex. successful surface wound debridement 2 marked improvement from last week 3No evidence of infection Electronic Signature(s) Signed: 01/07/2023 8:38:05 AM By: Shawn Stall RN, BSN Signed: 01/08/2023 7:25:57 AM By: Baltazar Najjar MD Previous Signature: 01/04/2023 9:31:55 AM Version By: Baltazar Najjar MD Entered By: Shawn Stall on 01/07/2023 08:32:30 Ishee, Tyrelle ([id number]) [id number] Page 8 of 8 -------------------------------------------------------------------------------- SuperBill Details Patient Name: Date of Service: [name], [name] 01/03/2023 Medical Record Number: [id number] Patient Account Number: [id number] Date of Birth/Sex: Treating RN: 1966-03-15 (57 y.o. M) Primary Care Provider: Baruch Goldmann Other Clinician: Referring Provider: Treating Provider/Extender: [name] Weeks in Treatment: 10 Diagnosis Coding ICD-10 Codes  with wound cleanser prior to applying a clean dressing using gauze sponges, not tissue or cotton balls. Kensinger, Garrin ([id number]) [id number] Page 4 of 8 Peri-Wound Care: Sween Lotion (Moisturizing lotion) 1 x Per Week/30 Days Discharge Instructions: Apply moisturizing lotion as directed Prim Dressing: IODOFLEX 0.9% Cadexomer Iodine Pad 4x6 cm 1 x Per Week/30 Days ary Discharge Instructions: Apply to wound bed as instructed Secondary Dressing: T Non-Adherent Dressing, 3x4 in 1 x Per Week/30 Days elfa Discharge Instructions: Apply over primary dressing as directed. Compression Wrap: Urgo K2 Lite, (equivalent to a 3 layer) two layer compression system, regular 1 x Per Week/30 Days Discharge Instructions: Apply Urgo K2 Lite as directed (alternative to 3 layer compression). Electronic Signature(s) Signed: 01/04/2023 9:31:55 AM By: Baltazar Najjar MD Signed: 01/07/2023 2:03:22 PM By: Brenton Grills Entered By: Brenton Grills on 01/03/2023 15:13:46 -------------------------------------------------------------------------------- Problem List Details Patient Name: Date of Service: [name], [name] 01/03/2023 2:45 PM Medical Record Number: [id number] Patient Account Number: [id number] Date of Birth/Sex: Treating RN: February 25, 1966 (57 y.o. [name] Primary Care Provider: Baruch Goldmann Other  Clinician: Referring Provider: Treating Provider/Extender: [name] Weeks in Treatment: 10 Active Problems ICD-10 Encounter Code Description Active Date MDM Diagnosis 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 10/25/2022 No Yes severity E11.622 Type 2 diabetes mellitus with other skin ulcer 10/25/2022 No Yes I87.312 Chronic venous hypertension (idiopathic) with ulcer of left lower extremity 12/06/2022 No Yes E11.22 Type 2 diabetes mellitus with diabetic chronic kidney disease 10/25/2022 No Yes R09.02 Hypoxemia 10/25/2022 No Yes N18.6 End stage renal disease 12/18/2022 No Yes Inactive Problems Resolved Problems Electronic Signature(s) Signed: 01/04/2023 9:31:55 AM By: Baltazar Najjar MD Beidler, Jerame ([id number]) [id number] Page 5 of 8 Entered By: Baltazar Najjar on 01/03/2023 15:18:18 -------------------------------------------------------------------------------- Progress Note Details Patient Name: Date of Service: [name], [name] 01/03/2023 2:45 PM Medical Record Number: [id number] Patient Account Number: [id number] Date of Birth/Sex: Treating RN: 21-Apr-1965 (57 y.o. M) Primary Care Provider: Baruch Goldmann Other Clinician: Referring Provider: Treating Provider/Extender: [name] Weeks in Treatment: 10 Subjective History of Present Illness (HPI) ADMISSION 08/04/2019 This is a 57 year old man who traumatized his right lateral lower leg while going [place] the mall sometime in December 2020. He was referred to vascular surgery and on 04/14/2019 he saw Dr. Raynelle Fanning. He was felt to have "palpable pulses and normal noninvasive studies" although I cannot see these results. He was back in his primary doctor's office on 07/30/2019 for a nonhealing right lower extremity wound. He was referred to wound care I think in [facility] but his insurance was out of network. A CNS  was ordered. X-ray was done that was negative. He has been using wound cleanser and a Band-Aid. Past medical history is actually quite extensive and includes chronic kidney disease stage IV, chronic diastolic heart failure, atrial fibrillation, poorly controlled hypertension, poorly controlled diabetes with peripheral neuropathy and a recent hemoglobin A1c of 11.5, depression, lymphedema and sleep apnea ABI in our clinic was 1.1 in the right 5/6; this wound was initially traumatized while the patient was going down on an escalator. Arrived in clinic last week with a completely nonviable surface. He required mechanical debridement and we have been using Iodoflex under compression. Apparently the compression wraps fell down. He also has poorly controlled diabetes 5/15; traumatic/contusion wound to the right lateral lower leg. We have been using Iodoflex to clean at the surface of this which was  Venne, Jaquon ([id number]) [id number] Page 1 of 8 Visit Report for 01/03/2023 HPI Details Patient Name: Date of Service: [name], [name] 01/03/2023 2:45 PM Medical Record Number: [id number] Patient Account Number: [id number] Date of Birth/Sex: Treating RN: 12-14-1965 (57 y.o. M) Primary Care Provider: Baruch Goldmann Other Clinician: Referring Provider: Treating Provider/Extender: [name] Weeks in Treatment: 10 History of Present Illness HPI Description: ADMISSION 08/04/2019 This is a 57 year old man who traumatized his right lateral lower leg while going [place] the mall sometime in December 2020. He was referred to vascular surgery and on 04/14/2019 he saw Dr. Raynelle Fanning. He was felt to have "palpable pulses and normal noninvasive studies" although I cannot see these results. He was back in his primary doctor's office on 07/30/2019 for a nonhealing right lower extremity wound. He was referred to wound care I think in [facility] but his insurance was out of network. A CNS was ordered. X-ray was done that was negative. He has been using wound cleanser and a Band-Aid. Past medical history is actually quite extensive and includes chronic kidney disease stage IV, chronic diastolic heart failure, atrial fibrillation, poorly controlled hypertension, poorly controlled diabetes with peripheral neuropathy and a recent hemoglobin A1c of 11.5, depression, lymphedema and sleep apnea ABI in our clinic was 1.1 in the right 5/6; this wound was initially traumatized while the patient was going down on an escalator. Arrived in clinic last week with a completely nonviable surface. He required mechanical debridement and we have been using Iodoflex under compression. Apparently the compression wraps fell down. He also has poorly controlled diabetes 5/15; traumatic/contusion wound to the right lateral lower leg. We have been using Iodoflex to clean at the  surface of this which was completely nonviable along with mechanical debridements. He is under compression. 09/02/2019 upon evaluation today patient appears to be doing well with regard to his wound on his right lateral leg. He has been tolerating the dressing changes without complication. Fortunately there is no signs of active infection at this time. No fevers, chills, nausea, vomiting, or diarrhea. With that being said there is some necrotic tissue around the edges of the wound that is and require some sharp debridement today. That was discussed with the patient and his mother in the office today prior to debridement to which she did consent. 09/09/2019 upon evaluation today patient appears to be making progress in regard to his wound. Fortunately the wound is not nearly as deep as what it was. It is measuring a little bit larger due to having to clear some of the edges away but again that is necessary to get this to heal. Fortunately there is no evidence of active infection at this time. No fevers, chills, nausea, vomiting, or diarrhea. 09/16/2019 on evaluation today patient appears to be doing excellent in regard to his wound currently. In fact I do not see anything that we can need to debride today which is great news. The wound bed looks healthy and the edges of the wound look like they are doing great. Overall I feel like we are at a good place and headed in the proper direction. 09/23/2019 upon evaluation today patient appears to be doing okay in regard to his wound. This is not measuring any smaller it is about the same. With that being said he tells me that his wrap actually slid down he ended up having to take this off he was on a trip and did not have his Velcro

## 2023-01-10 ENCOUNTER — Other Ambulatory Visit (HOSPITAL_COMMUNITY): Payer: Self-pay

## 2023-01-10 ENCOUNTER — Encounter (HOSPITAL_BASED_OUTPATIENT_CLINIC_OR_DEPARTMENT_OTHER): Payer: Medicare Other | Attending: Internal Medicine | Admitting: Internal Medicine

## 2023-01-10 DIAGNOSIS — I132 Hypertensive heart and chronic kidney disease with heart failure and with stage 5 chronic kidney disease, or end stage renal disease: Secondary | ICD-10-CM | POA: Diagnosis not present

## 2023-01-10 DIAGNOSIS — N186 End stage renal disease: Secondary | ICD-10-CM | POA: Diagnosis not present

## 2023-01-10 DIAGNOSIS — E1142 Type 2 diabetes mellitus with diabetic polyneuropathy: Secondary | ICD-10-CM | POA: Insufficient documentation

## 2023-01-10 DIAGNOSIS — L97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verity: Secondary | ICD-10-CM | POA: Insufficient documentation

## 2023-01-10 DIAGNOSIS — E11622 Type 2 diabetes mellitus with other skin ulcer: Secondary | ICD-10-CM | POA: Insufficient documentation

## 2023-01-10 DIAGNOSIS — I4891 Unspecified atrial fibrillation: Secondary | ICD-10-CM | POA: Diagnosis not present

## 2023-01-10 DIAGNOSIS — E1122 Type 2 diabetes mellitus with diabetic chronic kidney disease: Secondary | ICD-10-CM | POA: Insufficient documentation

## 2023-01-10 DIAGNOSIS — I87312 Chronic venous hypertension (idiopathic) with ulcer of left lower extremity: Secondary | ICD-10-CM | POA: Insufficient documentation

## 2023-01-10 DIAGNOSIS — I5032 Chronic diastolic (congestive) heart failure: Secondary | ICD-10-CM | POA: Diagnosis not present

## 2023-01-10 DIAGNOSIS — I890 Lymphedema, not elsewhere classified: Secondary | ICD-10-CM | POA: Insufficient documentation

## 2023-01-11 NOTE — Progress Notes (Signed)
Interventions: Assess patient/caregiver ability to perform ulcer/skin care regimen upon admission and as needed Assess ulceration(s) every visit Provide education on ulcer and skin care Treatment Activities: Skin care regimen initiated : 10/25/2022 Topical wound management initiated : 10/25/2022 Notes: Electronic Signature(s) Signed: 01/10/2023 5:22:21 PM By: Karie Schwalbe RN Entered By: Karie Schwalbe on 01/10/2023 14:06:01 -------------------------------------------------------------------------------- Pain Assessment  Details Patient Name: Date of Service: [name], [name] 01/10/2023 3:45 PM Medical Record Number: [id number] Patient Account Number: [id number] Date of Birth/Sex: Treating RN: 08/18/1965 (57 y.o. [name] Primary Care Sanyia Dini: Baruch Goldmann Other Clinician: Referring Reinhardt Licausi: Treating Corney Knighton/Extender: [name] Weeks in Treatment: 11 Active Problems Location of Pain Severity and Description of Pain Patient Has Paino Yes Site Locations Pain Location: Generalized Pain With Dressing Change: No Duration of the Pain. Constant / Intermittento Constant Rate the pain. Current Pain Level: 8 Worst Pain Level: 10 Least Pain Level: 3 Tolerable Pain Level: 5 Character of Pain Describe the Pain: Difficult to Pinpoint Pain Management and Medication Current Pain Management: Medication: Yes Cold Application: No Rest: Yes Massage: No Activity: No T.E.N.S.: No Heat Application: No Leg drop or elevation: No Is the Current Pain Management Adequate: Adequate How does your wound impact your activities of daily livingo Sleep: No Bathing: No Appetite: No Relationship With Others: No Bladder Continence: No Emotions: No Bowel Continence: No Work: No Toileting: No Drive: No [place], [place] ([id number]) [id number] Page 6 of 8 Dressing: No Hobbies: No Electronic Signature(s) Signed: 01/10/2023 5:22:21 PM By: Karie Schwalbe RN Entered By: Karie Schwalbe on 01/10/2023 12:59:36 -------------------------------------------------------------------------------- Patient/Caregiver Education Details Patient Name: Date of Service: [name], [name] 10/3/2024andnbsp3:45 PM Medical Record Number: [id number] Patient Account Number: [id number] Date of Birth/Gender: Treating RN: [birth year] (57 y.o. [name] Primary Care Physician: Baruch Goldmann Other Clinician: Referring Physician: Treating Physician/Extender: [name] in Treatment: 11 Education Assessment Education Provided To: Patient Education Topics Provided Wound/Skin Impairment: Methods: Explain/Verbal Responses: State content correctly Electronic Signature(s) Signed: 01/10/2023 5:22:21 PM By: Karie Schwalbe RN Entered By: Karie Schwalbe on 01/10/2023 14:06:20 -------------------------------------------------------------------------------- Wound Assessment Details Patient Name: Date of Service: [name], [name] 01/10/2023 3:45 PM Medical Record Number: [id number] Patient Account Number: [id number] Date of Birth/Sex: Treating RN: May 19, 1965 (57 y.o. [name] Primary Care Powell Halbert: Baruch Goldmann Other Clinician: Referring Jackeline Gutknecht: Treating Pallie Swigert/Extender: [name] Weeks in Treatment: 11 Wound Status Wound Number: 4 Primary Diabetic Wound/Ulcer of the Lower Extremity Etiology: Wound Location: Left, Anterior Lower Leg Secondary Venous Leg Ulcer Wounding Event: Gradually Appeared Etiology: Date Acquired: 08/10/2022 Wound Open Weeks Of Treatment: 11 Status: Clustered Wound: No Comorbid Lymphedema, Sleep Apnea, Arrhythmia, Congestive Heart Failure, History: Hypertension, Peripheral Arterial Disease, Type II Diabetes, End Stage Renal Disease Photos [place], [name] ([id number]) [id number] Page 7 of 8 Wound Measurements Length: (cm) 9.5 Width: (cm) 3.8 Depth: (cm) 0.1 Area: (cm) 28.353 Volume: (cm) 2.835 % Reduction in Area: -220.9% % Reduction in Volume: -220.7% Epithelialization: Small (1-33%) Tunneling: No Undermining: No Wound Description Classification: Grade 1 Wound Margin: Distinct, outline attached Exudate Amount: Medium Exudate Type: Serosanguineous Exudate Color: red, brown Foul Odor After Cleansing: No Slough/Fibrino Yes Wound Bed Granulation Amount: Medium (34-66%) Exposed Structure Granulation Quality: Red, Pink Fascia Exposed: No Necrotic  Amount: Medium (34-66%) Fat Layer (Subcutaneous Tissue) Exposed: Yes Necrotic Quality: Eschar, Adherent Slough Tendon Exposed: No Muscle Exposed: No Joint Exposed: No Bone Exposed: No Periwound Skin Texture Texture Color No Abnormalities Noted: No No Abnormalities Noted: No Callus:  Patient Account Number: [id number] Date of Birth/Sex: Treating RN: 05/14/1965 (57 y.o. M) Primary Care Yamina Lenis: Baruch Goldmann Other Clinician: Referring Shonica Weier: Treating Fany Cavanaugh/Extender: [name] Weeks in Treatment: 11 Vital Signs Height(in): 67 Pulse(bpm): 88 Weight(lbs): 223 Blood Pressure(mmHg): Body Mass Index(BMI): 34.9 Temperature(F): 98.1 Respiratory Rate(breaths/min): 18 [4:Photos:] [N/A:N/A] Left, Anterior Lower Leg N/A N/A Wound Location: Gradually Appeared N/A N/A Wounding Event: Diabetic Wound/Ulcer of the Lower N/A N/A Primary Etiology: Extremity Venous Leg Ulcer N/A N/A Secondary Etiology: Lymphedema, Sleep Apnea, N/A N/A Comorbid History: Arrhythmia, Congestive Heart Failure, Hypertension, Peripheral Arterial Disease, Type II Diabetes, End Stage Renal Disease 08/10/2022 N/A N/A Date Acquired: 11 N/A N/A Weeks of Treatment: Open N/A N/A Wound Status: No N/A N/A Wound Recurrence: 9.5x3.8x0.1 N/A N/A Measurements L x W x D (cm) 28.353 N/A N/A A (cm) : rea 2.835 N/A N/A Volume (cm) : -220.90% N/A N/A % Reduction in A  rea: -220.70% N/A N/A % Reduction in Volume: Grade 1 N/A N/A Classification: Medium N/A N/A Exudate A mount: Serosanguineous N/A N/A Exudate Type: red, brown N/A N/A Exudate Color: Distinct, outline attached N/A N/A Wound Margin: Medium (34-66%) N/A N/A Granulation A mount: Red, Pink N/A N/A Granulation Quality: Medium (34-66%) N/A N/A Necrotic A mount: Eschar, Adherent Slough N/A N/A Necrotic Tissue: Fat Layer (Subcutaneous Tissue): Yes N/A N/A Exposed Structures: Fascia: No Tendon: No Muscle: No Joint: No Bone: No Small (1-33%) N/A N/A Epithelialization: Debridement - Selective/Open Wound N/A N/A Debridement: Pre-procedure Verification/Time Out 16:11 N/A N/A Taken: Lidocaine 4% Topical Solution N/A N/A Pain Control: Slough N/A N/A Tissue Debrided: Non-Viable Tissue N/A N/A Level: 28.34 N/A N/A Debridement A (sq cm): rea Curette N/A N/A Instrument: Minimum N/A N/A Bleeding: Pressure N/A N/A Hemostasis A chieved: 0 N/A N/A Procedural Pain: 0 N/A N/A Post Procedural Pain: Beazley, [name] ([id number]) [id number] Page 4 of 8 Procedure was tolerated well N/A N/A Debridement Treatment Response: 9.5x3.8x0.1 N/A N/A Post Debridement Measurements L x W x D (cm) 2.835 N/A N/A Post Debridement Volume: (cm) Excoriation: No N/A N/A Periwound Skin Texture: Induration: No Callus: No Crepitus: No Rash: No Scarring: No Maceration: No N/A N/A Periwound Skin Moisture: Dry/Scaly: No Hemosiderin Staining: Yes N/A N/A Periwound Skin Color: Atrophie Blanche: No Cyanosis: No Ecchymosis: No Erythema: No Mottled: No Pallor: No Rubor: No Debridement N/A N/A Procedures Performed: Treatment Notes Electronic Signature(s) Signed: 01/10/2023 5:27:16 PM By: Baltazar Najjar MD Entered By: Baltazar Najjar on 01/10/2023 13:25:36 -------------------------------------------------------------------------------- Multi-Disciplinary Care Plan  Details Patient Name: Date of Service: [name], [name] 01/10/2023 3:45 PM Medical Record Number: [id number] Patient Account Number: [id number] Date of Birth/Sex: Treating RN: June 18, 1965 (57 y.o. [name] Primary Care Phelicia Dantes: Baruch Goldmann Other Clinician: Referring Tenita Cue: Treating Taelyn Nemes/Extender: [name] Weeks in Treatment: 11 Active Inactive Necrotic Tissue Nursing Diagnoses: Impaired tissue integrity related to necrotic/devitalized tissue Goals: Necrotic/devitalized tissue will be minimized in the wound bed Date Initiated: 10/25/2022 Target Resolution Date: 04/05/2023 Goal Status: Active Interventions: Assess patient pain level pre-, during and post procedure and prior to discharge Treatment Activities: Enzymatic debridement : 10/25/2022 T ordered outside of clinic : 10/25/2022 est Notes: Wound/Skin Impairment Nursing Diagnoses: Knowledge deficit related to ulceration/compromised skin integrity Goals: Patient/caregiver will verbalize understanding of skin care regimen Date Initiated: 10/25/2022 Target Resolution Date: 04/05/2023 Goal Status: Active Ulcer/skin breakdown will heal within 14 weeks Burel, Lafe ([id number]) [id number] Page 5 of 8 Date Initiated: 10/25/2022 Date Inactivated: 12/06/2022 Target Resolution Date: 11/16/2022 Goal Status: Unmet Unmet Reason: non compliance  Patient Account Number: [id number] Date of Birth/Sex: Treating RN: 05/14/1965 (57 y.o. M) Primary Care Yamina Lenis: Baruch Goldmann Other Clinician: Referring Shonica Weier: Treating Fany Cavanaugh/Extender: [name] Weeks in Treatment: 11 Vital Signs Height(in): 67 Pulse(bpm): 88 Weight(lbs): 223 Blood Pressure(mmHg): Body Mass Index(BMI): 34.9 Temperature(F): 98.1 Respiratory Rate(breaths/min): 18 [4:Photos:] [N/A:N/A] Left, Anterior Lower Leg N/A N/A Wound Location: Gradually Appeared N/A N/A Wounding Event: Diabetic Wound/Ulcer of the Lower N/A N/A Primary Etiology: Extremity Venous Leg Ulcer N/A N/A Secondary Etiology: Lymphedema, Sleep Apnea, N/A N/A Comorbid History: Arrhythmia, Congestive Heart Failure, Hypertension, Peripheral Arterial Disease, Type II Diabetes, End Stage Renal Disease 08/10/2022 N/A N/A Date Acquired: 11 N/A N/A Weeks of Treatment: Open N/A N/A Wound Status: No N/A N/A Wound Recurrence: 9.5x3.8x0.1 N/A N/A Measurements L x W x D (cm) 28.353 N/A N/A A (cm) : rea 2.835 N/A N/A Volume (cm) : -220.90% N/A N/A % Reduction in A  rea: -220.70% N/A N/A % Reduction in Volume: Grade 1 N/A N/A Classification: Medium N/A N/A Exudate A mount: Serosanguineous N/A N/A Exudate Type: red, brown N/A N/A Exudate Color: Distinct, outline attached N/A N/A Wound Margin: Medium (34-66%) N/A N/A Granulation A mount: Red, Pink N/A N/A Granulation Quality: Medium (34-66%) N/A N/A Necrotic A mount: Eschar, Adherent Slough N/A N/A Necrotic Tissue: Fat Layer (Subcutaneous Tissue): Yes N/A N/A Exposed Structures: Fascia: No Tendon: No Muscle: No Joint: No Bone: No Small (1-33%) N/A N/A Epithelialization: Debridement - Selective/Open Wound N/A N/A Debridement: Pre-procedure Verification/Time Out 16:11 N/A N/A Taken: Lidocaine 4% Topical Solution N/A N/A Pain Control: Slough N/A N/A Tissue Debrided: Non-Viable Tissue N/A N/A Level: 28.34 N/A N/A Debridement A (sq cm): rea Curette N/A N/A Instrument: Minimum N/A N/A Bleeding: Pressure N/A N/A Hemostasis A chieved: 0 N/A N/A Procedural Pain: 0 N/A N/A Post Procedural Pain: Beazley, [name] ([id number]) [id number] Page 4 of 8 Procedure was tolerated well N/A N/A Debridement Treatment Response: 9.5x3.8x0.1 N/A N/A Post Debridement Measurements L x W x D (cm) 2.835 N/A N/A Post Debridement Volume: (cm) Excoriation: No N/A N/A Periwound Skin Texture: Induration: No Callus: No Crepitus: No Rash: No Scarring: No Maceration: No N/A N/A Periwound Skin Moisture: Dry/Scaly: No Hemosiderin Staining: Yes N/A N/A Periwound Skin Color: Atrophie Blanche: No Cyanosis: No Ecchymosis: No Erythema: No Mottled: No Pallor: No Rubor: No Debridement N/A N/A Procedures Performed: Treatment Notes Electronic Signature(s) Signed: 01/10/2023 5:27:16 PM By: Baltazar Najjar MD Entered By: Baltazar Najjar on 01/10/2023 13:25:36 -------------------------------------------------------------------------------- Multi-Disciplinary Care Plan  Details Patient Name: Date of Service: [name], [name] 01/10/2023 3:45 PM Medical Record Number: [id number] Patient Account Number: [id number] Date of Birth/Sex: Treating RN: June 18, 1965 (57 y.o. [name] Primary Care Phelicia Dantes: Baruch Goldmann Other Clinician: Referring Tenita Cue: Treating Taelyn Nemes/Extender: [name] Weeks in Treatment: 11 Active Inactive Necrotic Tissue Nursing Diagnoses: Impaired tissue integrity related to necrotic/devitalized tissue Goals: Necrotic/devitalized tissue will be minimized in the wound bed Date Initiated: 10/25/2022 Target Resolution Date: 04/05/2023 Goal Status: Active Interventions: Assess patient pain level pre-, during and post procedure and prior to discharge Treatment Activities: Enzymatic debridement : 10/25/2022 T ordered outside of clinic : 10/25/2022 est Notes: Wound/Skin Impairment Nursing Diagnoses: Knowledge deficit related to ulceration/compromised skin integrity Goals: Patient/caregiver will verbalize understanding of skin care regimen Date Initiated: 10/25/2022 Target Resolution Date: 04/05/2023 Goal Status: Active Ulcer/skin breakdown will heal within 14 weeks Burel, Lafe ([id number]) [id number] Page 5 of 8 Date Initiated: 10/25/2022 Date Inactivated: 12/06/2022 Target Resolution Date: 11/16/2022 Goal Status: Unmet Unmet Reason: non compliance  Patient Account Number: [id number] Date of Birth/Sex: Treating RN: 05/14/1965 (57 y.o. M) Primary Care Yamina Lenis: Baruch Goldmann Other Clinician: Referring Shonica Weier: Treating Fany Cavanaugh/Extender: [name] Weeks in Treatment: 11 Vital Signs Height(in): 67 Pulse(bpm): 88 Weight(lbs): 223 Blood Pressure(mmHg): Body Mass Index(BMI): 34.9 Temperature(F): 98.1 Respiratory Rate(breaths/min): 18 [4:Photos:] [N/A:N/A] Left, Anterior Lower Leg N/A N/A Wound Location: Gradually Appeared N/A N/A Wounding Event: Diabetic Wound/Ulcer of the Lower N/A N/A Primary Etiology: Extremity Venous Leg Ulcer N/A N/A Secondary Etiology: Lymphedema, Sleep Apnea, N/A N/A Comorbid History: Arrhythmia, Congestive Heart Failure, Hypertension, Peripheral Arterial Disease, Type II Diabetes, End Stage Renal Disease 08/10/2022 N/A N/A Date Acquired: 11 N/A N/A Weeks of Treatment: Open N/A N/A Wound Status: No N/A N/A Wound Recurrence: 9.5x3.8x0.1 N/A N/A Measurements L x W x D (cm) 28.353 N/A N/A A (cm) : rea 2.835 N/A N/A Volume (cm) : -220.90% N/A N/A % Reduction in A  rea: -220.70% N/A N/A % Reduction in Volume: Grade 1 N/A N/A Classification: Medium N/A N/A Exudate A mount: Serosanguineous N/A N/A Exudate Type: red, brown N/A N/A Exudate Color: Distinct, outline attached N/A N/A Wound Margin: Medium (34-66%) N/A N/A Granulation A mount: Red, Pink N/A N/A Granulation Quality: Medium (34-66%) N/A N/A Necrotic A mount: Eschar, Adherent Slough N/A N/A Necrotic Tissue: Fat Layer (Subcutaneous Tissue): Yes N/A N/A Exposed Structures: Fascia: No Tendon: No Muscle: No Joint: No Bone: No Small (1-33%) N/A N/A Epithelialization: Debridement - Selective/Open Wound N/A N/A Debridement: Pre-procedure Verification/Time Out 16:11 N/A N/A Taken: Lidocaine 4% Topical Solution N/A N/A Pain Control: Slough N/A N/A Tissue Debrided: Non-Viable Tissue N/A N/A Level: 28.34 N/A N/A Debridement A (sq cm): rea Curette N/A N/A Instrument: Minimum N/A N/A Bleeding: Pressure N/A N/A Hemostasis A chieved: 0 N/A N/A Procedural Pain: 0 N/A N/A Post Procedural Pain: Beazley, [name] ([id number]) [id number] Page 4 of 8 Procedure was tolerated well N/A N/A Debridement Treatment Response: 9.5x3.8x0.1 N/A N/A Post Debridement Measurements L x W x D (cm) 2.835 N/A N/A Post Debridement Volume: (cm) Excoriation: No N/A N/A Periwound Skin Texture: Induration: No Callus: No Crepitus: No Rash: No Scarring: No Maceration: No N/A N/A Periwound Skin Moisture: Dry/Scaly: No Hemosiderin Staining: Yes N/A N/A Periwound Skin Color: Atrophie Blanche: No Cyanosis: No Ecchymosis: No Erythema: No Mottled: No Pallor: No Rubor: No Debridement N/A N/A Procedures Performed: Treatment Notes Electronic Signature(s) Signed: 01/10/2023 5:27:16 PM By: Baltazar Najjar MD Entered By: Baltazar Najjar on 01/10/2023 13:25:36 -------------------------------------------------------------------------------- Multi-Disciplinary Care Plan  Details Patient Name: Date of Service: [name], [name] 01/10/2023 3:45 PM Medical Record Number: [id number] Patient Account Number: [id number] Date of Birth/Sex: Treating RN: June 18, 1965 (57 y.o. [name] Primary Care Phelicia Dantes: Baruch Goldmann Other Clinician: Referring Tenita Cue: Treating Taelyn Nemes/Extender: [name] Weeks in Treatment: 11 Active Inactive Necrotic Tissue Nursing Diagnoses: Impaired tissue integrity related to necrotic/devitalized tissue Goals: Necrotic/devitalized tissue will be minimized in the wound bed Date Initiated: 10/25/2022 Target Resolution Date: 04/05/2023 Goal Status: Active Interventions: Assess patient pain level pre-, during and post procedure and prior to discharge Treatment Activities: Enzymatic debridement : 10/25/2022 T ordered outside of clinic : 10/25/2022 est Notes: Wound/Skin Impairment Nursing Diagnoses: Knowledge deficit related to ulceration/compromised skin integrity Goals: Patient/caregiver will verbalize understanding of skin care regimen Date Initiated: 10/25/2022 Target Resolution Date: 04/05/2023 Goal Status: Active Ulcer/skin breakdown will heal within 14 weeks Burel, Lafe ([id number]) [id number] Page 5 of 8 Date Initiated: 10/25/2022 Date Inactivated: 12/06/2022 Target Resolution Date: 11/16/2022 Goal Status: Unmet Unmet Reason: non compliance

## 2023-01-11 NOTE — Progress Notes (Signed)
[name], [name] ([id number]) [id number] Page 1 of 9 Visit Report for 01/10/2023 Debridement Details Patient Name: Date of Service: [name] 01/10/2023 3:45 PM Medical Record Number: [id number] Patient Account Number: [id number] Date of Birth/Sex: Treating RN: 07-Apr-1966 (57 y.o. [name] Primary Care Provider: Baruch [name] Other Clinician: Referring Provider: Treating Provider/Extender: [name] Weeks in Treatment: 11 Debridement Performed for Assessment: Wound #4 Left,Anterior Lower Leg Performed By: Physician [name]., MD The following information was scribed by: [name] The information was scribed for: [name] Debridement Type: Debridement Severity of Tissue Pre Debridement: Fat layer exposed Level of Consciousness (Pre-procedure): Awake and Alert Pre-procedure Verification/Time Out Yes - 16:11 Taken: Start Time: 16:11 Pain Control: Lidocaine 4% T opical Solution Percent of Wound Bed Debrided: 100% T Area Debrided (cm): otal 28.34 Tissue and other material debrided: Non-Viable, Slough, Slough Level: Non-Viable Tissue Debridement Description: Selective/Open Wound Instrument: Curette Bleeding: Minimum Hemostasis Achieved: Pressure End Time: 16:13 Procedural Pain: 0 Post Procedural Pain: 0 Response to Treatment: Procedure was tolerated well Level of Consciousness (Post- Awake and Alert procedure): Post Debridement Measurements of Total Wound Length: (cm) 9.5 Width: (cm) 3.8 Depth: (cm) 0.1 Volume: (cm) 2.835 Character of Wound/Ulcer Post Debridement: Improved Severity of Tissue Post Debridement: Fat layer exposed Post Procedure Diagnosis Same as Pre-procedure Electronic Signature(s) Signed: 01/10/2023 5:22:21 PM By: [name] Schwalbe RN Signed: 01/10/2023 5:27:16 PM By: [name] Najjar MD Entered By: [name] on 01/10/2023  13:18:35 -------------------------------------------------------------------------------- HPI Details Patient Name: Date of Service: [name] 01/10/2023 3:45 PM Medical Record Number: [id number] Patient Account Number: [id number] Date of Birth/Sex: Treating RN: 07-21-1965 (57 y.o. M) Primary Care Provider: Baruch [name] Other Clinician: Referring Provider: Treating Provider/Extender: [name] [name], Nemesio ([id number]) [id number] Page 2 of 9 Weeks in Treatment: 11 History of Present Illness HPI Description: ADMISSION 08/04/2019 This is a 57 year old man who traumatized his right lateral lower leg while going up an escalator at the mall sometime in December 2020. He was referred to vascular surgery and on 04/14/2019 he saw Dr. Raynelle [name]. He was felt to have "palpable pulses and normal noninvasive studies" although I cannot see these results. He was back in his primary doctor's office on 07/30/2019 for a nonhealing right lower extremity wound. He was referred to wound care I think in [facility] but his insurance was out of network. A CNS was ordered. X-ray was done that was negative. He has been using wound cleanser and a Band-Aid. Past medical history is actually quite extensive and includes chronic kidney disease stage IV, chronic diastolic heart failure, atrial fibrillation, poorly controlled hypertension, poorly controlled diabetes with peripheral neuropathy and a recent hemoglobin A1c of 11.5, depression, lymphedema and sleep apnea ABI in our clinic was 1.1 in the right 5/6; this wound was initially traumatized while the patient was going down on an escalator. Arrived in clinic last week with a completely nonviable surface. He required mechanical debridement and we have been using Iodoflex under compression. Apparently the compression wraps fell down. He also has poorly controlled diabetes 5/15; traumatic/contusion wound to the right  lateral lower leg. We have been using Iodoflex to clean at the surface of this which was completely nonviable along with mechanical debridements. He is under compression. 09/02/2019 upon evaluation today patient appears to be doing well with regard to his wound on his right lateral leg. He has been tolerating the dressing changes without complication. Fortunately there is no signs of active infection at  really traumatic wound. There is been a nice amount of epithelialization here. 2 open areas of the original area remains small circular areas superiorly and a more larger area and fully inferiorly. Surface slough debrided with a #5 curette underlying granulation tissue looks healthy no evidence of surrounding infection Electronic Signature(s) Signed: 01/10/2023 5:27:16 PM By: [name] Najjar MD Entered By: [name] on 01/10/2023 13:27:55 -------------------------------------------------------------------------------- Physician Orders Details Patient Name: Date of Service: [name] 01/10/2023 3:45 PM Medical Record Number: [id number] Patient Account Number: [id number] Date of Birth/Sex: Treating RN: 1965-05-15 (57 y.o. [name] Primary Care Provider: Baruch [name] Other Clinician: Referring Provider: Treating Provider/Extender: [name] in Treatment: 11 Verbal / Phone Orders: No Diagnosis Coding Follow-up Appointments ppointment in 1 week. - w/ Dr. Leanord [name] Room 9 Tuesday 01/15/23 at 10:15am Return A Please make  appt. Anesthetic (In clinic) Topical Lidocaine 5% applied to wound bed Bathing/ Shower/ Hygiene May shower with protection but do not get wound dressing(s) wet. Protect dressing(s) with water repellant cover (for example, large plastic bag) or a cast cover and may then take shower. - may purchase cast protector from [facility], [facility] or [employer] Edema Control - Lymphedema / SCD / Other Elevate legs to the level of the heart or above for 30 minutes daily and/or when sitting for 3-4 times a day throughout the day. Avoid standing for long periods of time. Exercise regularly Moisturize legs daily. Additional Orders / Instructions Follow Nutritious Diet - closely monitor blood sugar. increase protein levels. Wound Treatment Wound #4 - Lower Leg Wound Laterality: Left, Anterior Cleanser: Wound Cleanser (Generic) 1 x Per Week/30 Days Discharge Instructions: Cleanse the wound with wound cleanser prior to applying a clean dressing using gauze sponges, not tissue or cotton balls. Peri-Wound Care: Sween Lotion (Moisturizing lotion) 1 x Per Week/30 Days Discharge Instructions: Apply moisturizing lotion as directed Prim Dressing: IODOFLEX 0.9% Cadexomer Iodine Pad 4x6 cm 1 x Per Week/30 Days ary Discharge Instructions: Apply to wound bed as instructed Secondary Dressing: T Non-Adherent Dressing, 3x4 in 1 x Per Week/30 Days elfa Discharge Instructions: Apply over primary dressing as directed. Compression Wrap: Urgo K2 Lite, (equivalent to a 3 layer) two layer compression system, regular 1 x Per Week/30 Days Discharge Instructions: Apply Urgo K2 Lite as directed (alternative to 3 layer compression). Electronic Signature(s) Signed: 01/10/2023 5:22:21 PM By: [name] Schwalbe RN Signed: 01/10/2023 5:27:16 PM By: [name] Najjar MD Entered By: [name] on 01/10/2023 13:24:38 Hazell, [name] ([id number]) [id number] Page 5 of  9 -------------------------------------------------------------------------------- Problem List Details Patient Name: Date of Service: [name] 01/10/2023 3:45 PM Medical Record Number: [id number] Patient Account Number: [id number] Date of Birth/Sex: Treating RN: 01-22-1966 (57 y.o. 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 10/25/2022 No Yes severity I87.312 Chronic venous hypertension (idiopathic) with ulcer of left lower extremity 12/06/2022 No Yes E11.622 Type 2 diabetes mellitus with other skin ulcer 10/25/2022 No Yes E11.22 Type 2 diabetes mellitus with diabetic chronic kidney disease 10/25/2022 No Yes N18.6 End stage renal disease 12/18/2022 No Yes Inactive Problems ICD-10 Code Description Active Date Inactive Date R09.02 Hypoxemia 10/25/2022 10/25/2022 Resolved Problems Electronic Signature(s) Signed: 01/10/2023 5:27:16 PM By: [name] Najjar MD Entered By: [name] on 01/10/2023 13:25:16 -------------------------------------------------------------------------------- Progress Note Details Patient Name: Date  on a bed frame. Not clear exactly what they are doing for this currently but they were instructed by their primary doctor to use antibacterial soap and topical antibiotics. He is also on doxycycline. The wound is not healing and they are here for a review of this. The patient complains of pain in the area but I could not get a history of claudication. He arrived in our clinic walking accompanied by his mother. His O2 sat was 77% on room air. He did not have any specific complaints of shortness of breath or chest pain. He tells me he was at [facility] roughly 2 years ago for a thorough pulmonary review. As noted he is on dialysis. He is a non-smoker 7/25; patient presents for follow-up. He has not obtained his ABIs yet. We gave him the number to call vein and vascular to schedule as the order was sent last week. He has been using Iodoflex to the wound bed on the left anterior leg. He has no issues or complaints. At last clinic visit it was  recommended he go to the ED due to his oxygen saturation and he was admitted and had an additional session of dialysis. He feels well today. 8/29; patient missed his to last follow-up appointment and I have not seen him in over a month. On 8/18 he visited the ED for left leg pain. At that time he was treated with doxycycline. He subsequently went back to the ED on 8/23 for worsening left lower extremity wound and was admitted and treated with IV ceftazidime and this was continued on discharged during dialysis. (Apparently he has been on IV ceftazidime prior to admission for outpatient cultures growing Pseudomonas bacteremia) Wound is larger but patient denies systemic signs of infection. He had ABIs completed that showed a normal left ABI and normal toe brachial index. It is unclear what dressing he has been using for the past month. 9/10; patient presents for follow-up. We have been using PolyMem silver with antibiotic ointment under Kerlix/Coban. Even though the wound is larger it has healthy granulation tissue and depth has improved. He has no issues or complaints. He denies signs of infection. He has completed his course of antibiotics and dialysis. 9/17; this patient is using topical antibiotics under polymen kerlix Coban. The wound is large on the left anterior mid tibia area. It certainly does not have healthy granulation today. He complains of pain but no fever 9/26. The patient is using topical antibiotics under polymen until last week when I changed him to Iodoflex. We did an aggressive debridement last week although he did not tolerate this well. Still using Urgo K2 compression 10/3; continued nice improvement we have been using Iodoflex still under Urgo K2 compression. This was initially a traumatic wound probably some degree of chronic venous insufficiency. Electronic Signature(s) Signed: 01/10/2023 5:27:16 PM By: [name] Najjar MD Entered By: [name] on 01/10/2023  13:26:26 -------------------------------------------------------------------------------- Physical Exam Details Patient Name: Date of Service: [name] 01/10/2023 3:45 PM Medical Record Number: [id number] Patient Account Number: [id number] Date of Birth/Sex: Treating RN: 10-25-1965 (57 y.o. M) Primary Care Provider: Baruch [name] Other Clinician: Referring Provider: Treating Provider/Extender: [name] Weeks in Treatment: 11 Constitutional Pulse regular and within target range for patient.Marland Kitchen Respirations regular, non-labored and within target range.. Temperature is normal and within the target range for the patient.. Liller, [name] ([id number]) [id number] Page 4 of 9 Notes Wound exam; left anterior lower leg in the setting of chronic venous insufficiency. This  on a bed frame. Not clear exactly what they are doing for this currently but they were instructed by their primary doctor to use antibacterial soap and topical antibiotics. He is also on doxycycline. The wound is not healing and they are here for a review of this. The patient complains of pain in the area but I could not get a history of claudication. He arrived in our clinic walking accompanied by his mother. His O2 sat was 77% on room air. He did not have any specific complaints of shortness of breath or chest pain. He tells me he was at [facility] roughly 2 years ago for a thorough pulmonary review. As noted he is on dialysis. He is a non-smoker 7/25; patient presents for follow-up. He has not obtained his ABIs yet. We gave him the number to call vein and vascular to schedule as the order was sent last week. He has been using Iodoflex to the wound bed on the left anterior leg. He has no issues or complaints. At last clinic visit it was  recommended he go to the ED due to his oxygen saturation and he was admitted and had an additional session of dialysis. He feels well today. 8/29; patient missed his to last follow-up appointment and I have not seen him in over a month. On 8/18 he visited the ED for left leg pain. At that time he was treated with doxycycline. He subsequently went back to the ED on 8/23 for worsening left lower extremity wound and was admitted and treated with IV ceftazidime and this was continued on discharged during dialysis. (Apparently he has been on IV ceftazidime prior to admission for outpatient cultures growing Pseudomonas bacteremia) Wound is larger but patient denies systemic signs of infection. He had ABIs completed that showed a normal left ABI and normal toe brachial index. It is unclear what dressing he has been using for the past month. 9/10; patient presents for follow-up. We have been using PolyMem silver with antibiotic ointment under Kerlix/Coban. Even though the wound is larger it has healthy granulation tissue and depth has improved. He has no issues or complaints. He denies signs of infection. He has completed his course of antibiotics and dialysis. 9/17; this patient is using topical antibiotics under polymen kerlix Coban. The wound is large on the left anterior mid tibia area. It certainly does not have healthy granulation today. He complains of pain but no fever 9/26. The patient is using topical antibiotics under polymen until last week when I changed him to Iodoflex. We did an aggressive debridement last week although he did not tolerate this well. Still using Urgo K2 compression 10/3; continued nice improvement we have been using Iodoflex still under Urgo K2 compression. This was initially a traumatic wound probably some degree of chronic venous insufficiency. Electronic Signature(s) Signed: 01/10/2023 5:27:16 PM By: [name] Najjar MD Entered By: [name] on 01/10/2023  13:26:26 -------------------------------------------------------------------------------- Physical Exam Details Patient Name: Date of Service: [name] 01/10/2023 3:45 PM Medical Record Number: [id number] Patient Account Number: [id number] Date of Birth/Sex: Treating RN: 10-25-1965 (57 y.o. M) Primary Care Provider: Baruch [name] Other Clinician: Referring Provider: Treating Provider/Extender: [name] Weeks in Treatment: 11 Constitutional Pulse regular and within target range for patient.Marland Kitchen Respirations regular, non-labored and within target range.. Temperature is normal and within the target range for the patient.. Liller, [name] ([id number]) [id number] Page 4 of 9 Notes Wound exam; left anterior lower leg in the setting of chronic venous insufficiency. This  really traumatic wound. There is been a nice amount of epithelialization here. 2 open areas of the original area remains small circular areas superiorly and a more larger area and fully inferiorly. Surface slough debrided with a #5 curette underlying granulation tissue looks healthy no evidence of surrounding infection Electronic Signature(s) Signed: 01/10/2023 5:27:16 PM By: [name] Najjar MD Entered By: [name] on 01/10/2023 13:27:55 -------------------------------------------------------------------------------- Physician Orders Details Patient Name: Date of Service: [name] 01/10/2023 3:45 PM Medical Record Number: [id number] Patient Account Number: [id number] Date of Birth/Sex: Treating RN: 1965-05-15 (57 y.o. [name] Primary Care Provider: Baruch [name] Other Clinician: Referring Provider: Treating Provider/Extender: [name] in Treatment: 11 Verbal / Phone Orders: No Diagnosis Coding Follow-up Appointments ppointment in 1 week. - w/ Dr. Leanord [name] Room 9 Tuesday 01/15/23 at 10:15am Return A Please make  appt. Anesthetic (In clinic) Topical Lidocaine 5% applied to wound bed Bathing/ Shower/ Hygiene May shower with protection but do not get wound dressing(s) wet. Protect dressing(s) with water repellant cover (for example, large plastic bag) or a cast cover and may then take shower. - may purchase cast protector from [facility], [facility] or [employer] Edema Control - Lymphedema / SCD / Other Elevate legs to the level of the heart or above for 30 minutes daily and/or when sitting for 3-4 times a day throughout the day. Avoid standing for long periods of time. Exercise regularly Moisturize legs daily. Additional Orders / Instructions Follow Nutritious Diet - closely monitor blood sugar. increase protein levels. Wound Treatment Wound #4 - Lower Leg Wound Laterality: Left, Anterior Cleanser: Wound Cleanser (Generic) 1 x Per Week/30 Days Discharge Instructions: Cleanse the wound with wound cleanser prior to applying a clean dressing using gauze sponges, not tissue or cotton balls. Peri-Wound Care: Sween Lotion (Moisturizing lotion) 1 x Per Week/30 Days Discharge Instructions: Apply moisturizing lotion as directed Prim Dressing: IODOFLEX 0.9% Cadexomer Iodine Pad 4x6 cm 1 x Per Week/30 Days ary Discharge Instructions: Apply to wound bed as instructed Secondary Dressing: T Non-Adherent Dressing, 3x4 in 1 x Per Week/30 Days elfa Discharge Instructions: Apply over primary dressing as directed. Compression Wrap: Urgo K2 Lite, (equivalent to a 3 layer) two layer compression system, regular 1 x Per Week/30 Days Discharge Instructions: Apply Urgo K2 Lite as directed (alternative to 3 layer compression). Electronic Signature(s) Signed: 01/10/2023 5:22:21 PM By: [name] Schwalbe RN Signed: 01/10/2023 5:27:16 PM By: [name] Najjar MD Entered By: [name] on 01/10/2023 13:24:38 Hazell, [name] ([id number]) [id number] Page 5 of  9 -------------------------------------------------------------------------------- Problem List Details Patient Name: Date of Service: [name] 01/10/2023 3:45 PM Medical Record Number: [id number] Patient Account Number: [id number] Date of Birth/Sex: Treating RN: 01-22-1966 (57 y.o. 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 10/25/2022 No Yes severity I87.312 Chronic venous hypertension (idiopathic) with ulcer of left lower extremity 12/06/2022 No Yes E11.622 Type 2 diabetes mellitus with other skin ulcer 10/25/2022 No Yes E11.22 Type 2 diabetes mellitus with diabetic chronic kidney disease 10/25/2022 No Yes N18.6 End stage renal disease 12/18/2022 No Yes Inactive Problems ICD-10 Code Description Active Date Inactive Date R09.02 Hypoxemia 10/25/2022 10/25/2022 Resolved Problems Electronic Signature(s) Signed: 01/10/2023 5:27:16 PM By: [name] Najjar MD Entered By: [name] on 01/10/2023 13:25:16 -------------------------------------------------------------------------------- Progress Note Details Patient Name: Date  [name], [name] ([id number]) [id number] Page 1 of 9 Visit Report for 01/10/2023 Debridement Details Patient Name: Date of Service: [name] 01/10/2023 3:45 PM Medical Record Number: [id number] Patient Account Number: [id number] Date of Birth/Sex: Treating RN: 07-Apr-1966 (57 y.o. [name] Primary Care Provider: Baruch [name] Other Clinician: Referring Provider: Treating Provider/Extender: [name] Weeks in Treatment: 11 Debridement Performed for Assessment: Wound #4 Left,Anterior Lower Leg Performed By: Physician [name]., MD The following information was scribed by: [name] The information was scribed for: [name] Debridement Type: Debridement Severity of Tissue Pre Debridement: Fat layer exposed Level of Consciousness (Pre-procedure): Awake and Alert Pre-procedure Verification/Time Out Yes - 16:11 Taken: Start Time: 16:11 Pain Control: Lidocaine 4% T opical Solution Percent of Wound Bed Debrided: 100% T Area Debrided (cm): otal 28.34 Tissue and other material debrided: Non-Viable, Slough, Slough Level: Non-Viable Tissue Debridement Description: Selective/Open Wound Instrument: Curette Bleeding: Minimum Hemostasis Achieved: Pressure End Time: 16:13 Procedural Pain: 0 Post Procedural Pain: 0 Response to Treatment: Procedure was tolerated well Level of Consciousness (Post- Awake and Alert procedure): Post Debridement Measurements of Total Wound Length: (cm) 9.5 Width: (cm) 3.8 Depth: (cm) 0.1 Volume: (cm) 2.835 Character of Wound/Ulcer Post Debridement: Improved Severity of Tissue Post Debridement: Fat layer exposed Post Procedure Diagnosis Same as Pre-procedure Electronic Signature(s) Signed: 01/10/2023 5:22:21 PM By: [name] Schwalbe RN Signed: 01/10/2023 5:27:16 PM By: [name] Najjar MD Entered By: [name] on 01/10/2023  13:18:35 -------------------------------------------------------------------------------- HPI Details Patient Name: Date of Service: [name] 01/10/2023 3:45 PM Medical Record Number: [id number] Patient Account Number: [id number] Date of Birth/Sex: Treating RN: 07-21-1965 (57 y.o. M) Primary Care Provider: Baruch [name] Other Clinician: Referring Provider: Treating Provider/Extender: [name] [name], Nemesio ([id number]) [id number] Page 2 of 9 Weeks in Treatment: 11 History of Present Illness HPI Description: ADMISSION 08/04/2019 This is a 57 year old man who traumatized his right lateral lower leg while going [place] the mall sometime in December 2020. He was referred to vascular surgery and on 04/14/2019 he saw Dr. Raynelle [name]. He was felt to have "palpable pulses and normal noninvasive studies" although I cannot see these results. He was back in his primary doctor's office on 07/30/2019 for a nonhealing right lower extremity wound. He was referred to wound care I think in [facility] but his insurance was out of network. A CNS was ordered. X-ray was done that was negative. He has been using wound cleanser and a Band-Aid. Past medical history is actually quite extensive and includes chronic kidney disease stage IV, chronic diastolic heart failure, atrial fibrillation, poorly controlled hypertension, poorly controlled diabetes with peripheral neuropathy and a recent hemoglobin A1c of 11.5, depression, lymphedema and sleep apnea ABI in our clinic was 1.1 in the right 5/6; this wound was initially traumatized while the patient was going down on an escalator. Arrived in clinic last week with a completely nonviable surface. He required mechanical debridement and we have been using Iodoflex under compression. Apparently the compression wraps fell down. He also has poorly controlled diabetes 5/15; traumatic/contusion wound to the right  lateral lower leg. We have been using Iodoflex to clean at the surface of this which was completely nonviable along with mechanical debridements. He is under compression. 09/02/2019 upon evaluation today patient appears to be doing well with regard to his wound on his right lateral leg. He has been tolerating the dressing changes without complication. Fortunately there is no signs of active infection at  really traumatic wound. There is been a nice amount of epithelialization here. 2 open areas of the original area remains small circular areas superiorly and a more larger area and fully inferiorly. Surface slough debrided with a #5 curette underlying granulation tissue looks healthy no evidence of surrounding infection Electronic Signature(s) Signed: 01/10/2023 5:27:16 PM By: [name] Najjar MD Entered By: [name] on 01/10/2023 13:27:55 -------------------------------------------------------------------------------- Physician Orders Details Patient Name: Date of Service: [name] 01/10/2023 3:45 PM Medical Record Number: [id number] Patient Account Number: [id number] Date of Birth/Sex: Treating RN: 1965-05-15 (57 y.o. [name] Primary Care Provider: Baruch [name] Other Clinician: Referring Provider: Treating Provider/Extender: [name] in Treatment: 11 Verbal / Phone Orders: No Diagnosis Coding Follow-up Appointments ppointment in 1 week. - w/ Dr. Leanord [name] Room 9 Tuesday 01/15/23 at 10:15am Return A Please make  appt. Anesthetic (In clinic) Topical Lidocaine 5% applied to wound bed Bathing/ Shower/ Hygiene May shower with protection but do not get wound dressing(s) wet. Protect dressing(s) with water repellant cover (for example, large plastic bag) or a cast cover and may then take shower. - may purchase cast protector from [facility], [facility] or [employer] Edema Control - Lymphedema / SCD / Other Elevate legs to the level of the heart or above for 30 minutes daily and/or when sitting for 3-4 times a day throughout the day. Avoid standing for long periods of time. Exercise regularly Moisturize legs daily. Additional Orders / Instructions Follow Nutritious Diet - closely monitor blood sugar. increase protein levels. Wound Treatment Wound #4 - Lower Leg Wound Laterality: Left, Anterior Cleanser: Wound Cleanser (Generic) 1 x Per Week/30 Days Discharge Instructions: Cleanse the wound with wound cleanser prior to applying a clean dressing using gauze sponges, not tissue or cotton balls. Peri-Wound Care: Sween Lotion (Moisturizing lotion) 1 x Per Week/30 Days Discharge Instructions: Apply moisturizing lotion as directed Prim Dressing: IODOFLEX 0.9% Cadexomer Iodine Pad 4x6 cm 1 x Per Week/30 Days ary Discharge Instructions: Apply to wound bed as instructed Secondary Dressing: T Non-Adherent Dressing, 3x4 in 1 x Per Week/30 Days elfa Discharge Instructions: Apply over primary dressing as directed. Compression Wrap: Urgo K2 Lite, (equivalent to a 3 layer) two layer compression system, regular 1 x Per Week/30 Days Discharge Instructions: Apply Urgo K2 Lite as directed (alternative to 3 layer compression). Electronic Signature(s) Signed: 01/10/2023 5:22:21 PM By: [name] Schwalbe RN Signed: 01/10/2023 5:27:16 PM By: [name] Najjar MD Entered By: [name] on 01/10/2023 13:24:38 Hazell, [name] ([id number]) [id number] Page 5 of  9 -------------------------------------------------------------------------------- Problem List Details Patient Name: Date of Service: [name] 01/10/2023 3:45 PM Medical Record Number: [id number] Patient Account Number: [id number] Date of Birth/Sex: Treating RN: 01-22-1966 (57 y.o. 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 10/25/2022 No Yes severity I87.312 Chronic venous hypertension (idiopathic) with ulcer of left lower extremity 12/06/2022 No Yes E11.622 Type 2 diabetes mellitus with other skin ulcer 10/25/2022 No Yes E11.22 Type 2 diabetes mellitus with diabetic chronic kidney disease 10/25/2022 No Yes N18.6 End stage renal disease 12/18/2022 No Yes Inactive Problems ICD-10 Code Description Active Date Inactive Date R09.02 Hypoxemia 10/25/2022 10/25/2022 Resolved Problems Electronic Signature(s) Signed: 01/10/2023 5:27:16 PM By: [name] Najjar MD Entered By: [name] on 01/10/2023 13:25:16 -------------------------------------------------------------------------------- Progress Note Details Patient Name: Date  instrument(s): Curette to remove Non-Viable tissue/material. Material removed includes Slough after achieving pain control using Lidocaine 4% T opical Solution. No specimens were taken. A time out was conducted at 16:11, prior  to the start of the procedure. A Minimum amount of bleeding was controlled with Pressure. The procedure was tolerated well with a pain level of 0 throughout and a pain level of 0 following the procedure. Post Debridement Measurements: 9.5cm length x 3.8cm width x 0.1cm depth; 2.835cm^3 volume. Character of Wound/Ulcer Post Debridement is improved. Severity of Tissue Post Debridement is: Fat layer exposed. Post procedure Diagnosis Wound #4: Same as Pre-Procedure Plan Follow-up Appointments: Return Appointment in 1 week. - w/ Dr. Leanord [name] Room 9 Tuesday 01/15/23 at 10:15am Please make appt. Anesthetic: (In clinic) Topical Lidocaine 5% applied to wound bed Bathing/ Shower/ Hygiene: May shower with protection but do not get wound dressing(s) wet. Protect dressing(s) with water repellant cover (for example, large plastic bag) or a cast cover and may then take shower. - may purchase cast protector from [facility], [facility] or [employer] Edema Control - Lymphedema / SCD / Other: Elevate legs to the level of the heart or above for 30 minutes daily and/or when sitting for 3-4 times a day throughout the day. Avoid standing for long periods of time. Exercise regularly Moisturize legs daily. Additional Orders / Instructions: Follow Nutritious Diet - closely monitor blood sugar. increase protein levels. WOUND #4: - Lower Leg Wound Laterality: Left, Anterior Cleanser: Wound Cleanser (Generic) 1 x Per Week/30 Days Discharge Instructions: Cleanse the wound with wound cleanser prior to applying a clean dressing using gauze sponges, not tissue or cotton balls. Peri-Wound Care: Sween Lotion (Moisturizing lotion) 1 x Per Week/30 Days Discharge Instructions: Apply moisturizing lotion as directed Prim Dressing: IODOFLEX 0.9% Cadexomer Iodine Pad 4x6 cm 1 x Per Week/30 Days ary Discharge Instructions: Apply to wound bed as instructed Secondary Dressing: T Non-Adherent Dressing, 3x4 in 1 x Per Week/30 Days elfa Discharge  Instructions: Apply over primary dressing as directed. Com pression Wrap: Urgo K2 Lite, (equivalent to a 3 layer) two layer compression system, regular 1 x Per Week/30 Days Discharge Instructions: Apply Urgo K2 Lite as directed (alternative to 3 layer compression). 1. Continue Iodoflex. Hydrofera Blue would have been a reasonable alternative 2. Continue and Urgo K2 light compression 3. The patient has made nice epithelialization like debridement today Electronic Signature(s) Signed: 01/10/2023 5:27:16 PM By: [name] Najjar MD Entered By: [name] on 01/10/2023 13:29:23 -------------------------------------------------------------------------------- SuperBill Details Patient Name: Date of Service: [name] 01/10/2023 Medical Record Number: [id number] Patient Account Number: [id number] Marczak, [name] ([id number]) [id number] Page 9 of 9 Date of Birth/Sex: Treating RN: [birth year] (57 y.o. 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 severity I87.312 Chronic venous hypertension (idiopathic) with ulcer of left lower extremity E11.622 Type 2 diabetes mellitus with other skin ulcer E11.22 Type 2 diabetes mellitus with diabetic chronic kidney disease N18.6 End stage renal disease Facility Procedures : CPT4 Code: 40102725 Description: 97597 - DEBRIDE WOUND 1ST 20 SQ CM OR < ICD-10 Diagnosis Description 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 Material removed includes Slough after achieving pain control using Lidocaine 4% T opical Solution. No specimens were taken. A time out was conducted at 16:11, prior  to the start of the procedure. A Minimum amount of bleeding was controlled with Pressure. The procedure was tolerated well with a pain level of 0 throughout and a pain level of 0 following the procedure. Post Debridement Measurements: 9.5cm length x 3.8cm width x 0.1cm depth; 2.835cm^3 volume. Character of Wound/Ulcer Post Debridement is improved. Severity of Tissue Post Debridement is: Fat layer exposed. Post procedure Diagnosis Wound #4: Same as Pre-Procedure Plan Follow-up Appointments: Return Appointment in 1 week. - w/ Dr. Leanord [name] Room 9 Tuesday 01/15/23 at 10:15am Please make appt. Anesthetic: (In clinic) Topical Lidocaine 5% applied to wound bed Bathing/ Shower/ Hygiene: May shower with protection but do not get wound dressing(s) wet. Protect dressing(s) with water repellant cover (for example, large plastic bag) or a cast cover and may then take shower. - may purchase cast protector from [facility], [facility] or [employer] Edema Control - Lymphedema / SCD / Other: Elevate legs to the level of the heart or above for 30 minutes daily and/or when sitting for 3-4 times a day throughout the day. Avoid standing for long periods of time. Exercise regularly Moisturize legs daily. Additional Orders / Instructions: Follow Nutritious Diet - closely monitor blood sugar. increase protein levels. WOUND #4: - Lower Leg Wound Laterality: Left, Anterior Cleanser: Wound Cleanser (Generic) 1 x Per Week/30 Days Discharge Instructions: Cleanse the wound with wound cleanser prior to applying a clean dressing using gauze sponges, not tissue or cotton balls. Peri-Wound Care: Sween Lotion (Moisturizing lotion) 1 x Per Week/30 Days Discharge Instructions: Apply moisturizing lotion as directed Prim Dressing: IODOFLEX 0.9% Cadexomer Iodine Pad 4x6 cm 1 x Per Week/30 Days ary Discharge Instructions: Apply to wound bed as instructed Secondary Dressing: T Non-Adherent Dressing, 3x4 in 1 x Per Week/30 Days elfa Discharge  Instructions: Apply over primary dressing as directed. Com pression Wrap: Urgo K2 Lite, (equivalent to a 3 layer) two layer compression system, regular 1 x Per Week/30 Days Discharge Instructions: Apply Urgo K2 Lite as directed (alternative to 3 layer compression). 1. Continue Iodoflex. Hydrofera Blue would have been a reasonable alternative 2. Continue and Urgo K2 light compression 3. The patient has made nice epithelialization like debridement today Electronic Signature(s) Signed: 01/10/2023 5:27:16 PM By: [name] Najjar MD Entered By: [name] on 01/10/2023 13:29:23 -------------------------------------------------------------------------------- SuperBill Details Patient Name: Date of Service: [name] 01/10/2023 Medical Record Number: [id number] Patient Account Number: [id number] Marczak, [name] ([id number]) [id number] Page 9 of 9 Date of Birth/Sex: Treating RN: [birth year] (57 y.o. 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 severity I87.312 Chronic venous hypertension (idiopathic) with ulcer of left lower extremity E11.622 Type 2 diabetes mellitus with other skin ulcer E11.22 Type 2 diabetes mellitus with diabetic chronic kidney disease N18.6 End stage renal disease Facility Procedures : CPT4 Code: [id number] Description: 97597 - DEBRIDE WOUND 1ST 20 SQ CM OR < ICD-10 Diagnosis Description 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 Modifier: verity Quantity: 1 : CPT4 Code: 36644034 Description: 97598 - DEBRIDE WOUND EA ADDL 20 SQ CM ICD-10 Diagnosis Description L97.828 Non-pressure chronic ulcer of other part of  of Service: [name] 01/10/2023 3:45 PM Medical Record Number: [id number] Patient Account Number: [id number] Date of Birth/Sex: Treating RN: 1965-09-20 (57 y.o. M) Primary Care Provider: Baruch [name] Other Clinician: Referring Provider: Treating Provider/Extender: [name] Weeks in Treatment: 11 Subjective [place], [name] ([id number]) [id number] Page 6 of 9 History of Present Illness (HPI) ADMISSION 08/04/2019 This is a 57 year old man who traumatized his right lateral lower leg while going [place] an escalator at the mall sometime in  December 2020. He was referred to vascular surgery and on 04/14/2019 he saw Dr. Raynelle [name]. He was felt to have "palpable pulses and normal noninvasive studies" although I cannot see these results. He was back in his primary doctor's office on 07/30/2019 for a nonhealing right lower extremity wound. He was referred to wound care I think in [facility] [address] but his insurance was out of network. A CNS was ordered. X-ray was done that was negative. He has been using wound cleanser and a Band-Aid. Past medical history is actually quite extensive and includes chronic kidney disease stage IV, chronic diastolic heart failure, atrial fibrillation, poorly controlled hypertension, poorly controlled diabetes with peripheral neuropathy and a recent hemoglobin A1c of 11.5, depression, lymphedema and sleep apnea ABI in our clinic was 1.1 in the right 5/6; this wound was initially traumatized while the patient was going down on an escalator. Arrived in clinic last week with a completely nonviable surface. He required mechanical debridement and we have been using Iodoflex under compression. Apparently the compression wraps fell down. He also has poorly controlled diabetes 5/15; traumatic/contusion wound to the right lateral lower leg. We have been using Iodoflex to clean at the surface of this which was completely nonviable along with mechanical debridements. He is under compression. 09/02/2019 upon evaluation today patient appears to be doing well with regard to his wound on his right lateral leg. He has been tolerating the dressing changes without complication. Fortunately there is no signs of active infection at this time. No fevers, chills, nausea, vomiting, or diarrhea. With that being said there is some necrotic tissue around the edges of the wound that is and require some sharp debridement today. That was discussed with the patient and his mother in the office today prior to debridement to which she did  consent. 09/09/2019 upon evaluation today patient appears to be making progress in regard to his wound. Fortunately the wound is not nearly as deep as what it was. It is measuring a little bit larger due to having to clear some of the edges away but again that is necessary to get this to heal. Fortunately there is no evidence of active infection at this time. No fevers, chills, nausea, vomiting, or diarrhea. 09/16/2019 on evaluation today patient appears to be doing excellent in regard to his wound currently. In fact I do not see anything that we can need to debride today which is great news. The wound bed looks healthy and the edges of the wound look like they are doing great. Overall I feel like we are at a good place and headed in the proper direction. 09/23/2019 upon evaluation today patient appears to be doing okay in regard to his wound. This is not measuring any smaller it is about the same. With that being said he tells me that his wrap actually slid down he ended up having to take this off he was on a trip and did not have his Velcro compression with him. With that being said I  really traumatic wound. There is been a nice amount of epithelialization here. 2 open areas of the original area remains small circular areas superiorly and a more larger area and fully inferiorly. Surface slough debrided with a #5 curette underlying granulation tissue looks healthy no evidence of surrounding infection Electronic Signature(s) Signed: 01/10/2023 5:27:16 PM By: [name] Najjar MD Entered By: [name] on 01/10/2023 13:27:55 -------------------------------------------------------------------------------- Physician Orders Details Patient Name: Date of Service: [name] 01/10/2023 3:45 PM Medical Record Number: [id number] Patient Account Number: [id number] Date of Birth/Sex: Treating RN: 1965-05-15 (57 y.o. [name] Primary Care Provider: Baruch [name] Other Clinician: Referring Provider: Treating Provider/Extender: [name] in Treatment: 11 Verbal / Phone Orders: No Diagnosis Coding Follow-up Appointments ppointment in 1 week. - w/ Dr. Leanord [name] Room 9 Tuesday 01/15/23 at 10:15am Return A Please make  appt. Anesthetic (In clinic) Topical Lidocaine 5% applied to wound bed Bathing/ Shower/ Hygiene May shower with protection but do not get wound dressing(s) wet. Protect dressing(s) with water repellant cover (for example, large plastic bag) or a cast cover and may then take shower. - may purchase cast protector from [facility], [facility] or [employer] Edema Control - Lymphedema / SCD / Other Elevate legs to the level of the heart or above for 30 minutes daily and/or when sitting for 3-4 times a day throughout the day. Avoid standing for long periods of time. Exercise regularly Moisturize legs daily. Additional Orders / Instructions Follow Nutritious Diet - closely monitor blood sugar. increase protein levels. Wound Treatment Wound #4 - Lower Leg Wound Laterality: Left, Anterior Cleanser: Wound Cleanser (Generic) 1 x Per Week/30 Days Discharge Instructions: Cleanse the wound with wound cleanser prior to applying a clean dressing using gauze sponges, not tissue or cotton balls. Peri-Wound Care: Sween Lotion (Moisturizing lotion) 1 x Per Week/30 Days Discharge Instructions: Apply moisturizing lotion as directed Prim Dressing: IODOFLEX 0.9% Cadexomer Iodine Pad 4x6 cm 1 x Per Week/30 Days ary Discharge Instructions: Apply to wound bed as instructed Secondary Dressing: T Non-Adherent Dressing, 3x4 in 1 x Per Week/30 Days elfa Discharge Instructions: Apply over primary dressing as directed. Compression Wrap: Urgo K2 Lite, (equivalent to a 3 layer) two layer compression system, regular 1 x Per Week/30 Days Discharge Instructions: Apply Urgo K2 Lite as directed (alternative to 3 layer compression). Electronic Signature(s) Signed: 01/10/2023 5:22:21 PM By: [name] Schwalbe RN Signed: 01/10/2023 5:27:16 PM By: [name] Najjar MD Entered By: [name] on 01/10/2023 13:24:38 Hazell, [name] ([id number]) [id number] Page 5 of  9 -------------------------------------------------------------------------------- Problem List Details Patient Name: Date of Service: [name] 01/10/2023 3:45 PM Medical Record Number: [id number] Patient Account Number: [id number] Date of Birth/Sex: Treating RN: 01-22-1966 (57 y.o. 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 10/25/2022 No Yes severity I87.312 Chronic venous hypertension (idiopathic) with ulcer of left lower extremity 12/06/2022 No Yes E11.622 Type 2 diabetes mellitus with other skin ulcer 10/25/2022 No Yes E11.22 Type 2 diabetes mellitus with diabetic chronic kidney disease 10/25/2022 No Yes N18.6 End stage renal disease 12/18/2022 No Yes Inactive Problems ICD-10 Code Description Active Date Inactive Date R09.02 Hypoxemia 10/25/2022 10/25/2022 Resolved Problems Electronic Signature(s) Signed: 01/10/2023 5:27:16 PM By: [name] Najjar MD Entered By: [name] on 01/10/2023 13:25:16 -------------------------------------------------------------------------------- Progress Note Details Patient Name: Date  [name], [name] ([id number]) [id number] Page 1 of 9 Visit Report for 01/10/2023 Debridement Details Patient Name: Date of Service: [name] 01/10/2023 3:45 PM Medical Record Number: [id number] Patient Account Number: [id number] Date of Birth/Sex: Treating RN: 07-Apr-1966 (57 y.o. [name] Primary Care Provider: Baruch [name] Other Clinician: Referring Provider: Treating Provider/Extender: [name] Weeks in Treatment: 11 Debridement Performed for Assessment: Wound #4 Left,Anterior Lower Leg Performed By: Physician [name]., MD The following information was scribed by: [name] The information was scribed for: [name] Debridement Type: Debridement Severity of Tissue Pre Debridement: Fat layer exposed Level of Consciousness (Pre-procedure): Awake and Alert Pre-procedure Verification/Time Out Yes - 16:11 Taken: Start Time: 16:11 Pain Control: Lidocaine 4% T opical Solution Percent of Wound Bed Debrided: 100% T Area Debrided (cm): otal 28.34 Tissue and other material debrided: Non-Viable, Slough, Slough Level: Non-Viable Tissue Debridement Description: Selective/Open Wound Instrument: Curette Bleeding: Minimum Hemostasis Achieved: Pressure End Time: 16:13 Procedural Pain: 0 Post Procedural Pain: 0 Response to Treatment: Procedure was tolerated well Level of Consciousness (Post- Awake and Alert procedure): Post Debridement Measurements of Total Wound Length: (cm) 9.5 Width: (cm) 3.8 Depth: (cm) 0.1 Volume: (cm) 2.835 Character of Wound/Ulcer Post Debridement: Improved Severity of Tissue Post Debridement: Fat layer exposed Post Procedure Diagnosis Same as Pre-procedure Electronic Signature(s) Signed: 01/10/2023 5:22:21 PM By: [name] Schwalbe RN Signed: 01/10/2023 5:27:16 PM By: [name] Najjar MD Entered By: [name] on 01/10/2023  13:18:35 -------------------------------------------------------------------------------- HPI Details Patient Name: Date of Service: [name] 01/10/2023 3:45 PM Medical Record Number: [id number] Patient Account Number: [id number] Date of Birth/Sex: Treating RN: 07-21-1965 (57 y.o. M) Primary Care Provider: Baruch [name] Other Clinician: Referring Provider: Treating Provider/Extender: [name] [name], [place] ([id number]) [id number] Page 2 of 9 Weeks in Treatment: 11 History of Present Illness HPI Description: ADMISSION 08/04/2019 This is a 57 year old man who traumatized his right lateral lower leg while going up an escalator at the mall sometime in December 2020. He was referred to vascular surgery and on 04/14/2019 he saw Dr. Raynelle [name]. He was felt to have "palpable pulses and normal noninvasive studies" although I cannot see these results. He was back in his primary doctor's office on 07/30/2019 for a nonhealing right lower extremity wound. He was referred to wound care I think in [facility] but his insurance was out of network. A CNS was ordered. X-ray was done that was negative. He has been using wound cleanser and a Band-Aid. Past medical history is actually quite extensive and includes chronic kidney disease stage IV, chronic diastolic heart failure, atrial fibrillation, poorly controlled hypertension, poorly controlled diabetes with peripheral neuropathy and a recent hemoglobin A1c of 11.5, depression, lymphedema and sleep apnea ABI in our clinic was 1.1 in the right 5/6; this wound was initially traumatized while the patient was going down on an escalator. Arrived in clinic last week with a completely nonviable surface. He required mechanical debridement and we have been using Iodoflex under compression. Apparently the compression wraps fell down. He also has poorly controlled diabetes 5/15; traumatic/contusion wound to the right  lateral lower leg. We have been using Iodoflex to clean at the surface of this which was completely nonviable along with mechanical debridements. He is under compression. 09/02/2019 upon evaluation today patient appears to be doing well with regard to his wound on his right lateral leg. He has been tolerating the dressing changes without complication. Fortunately there is no signs of active infection at  really traumatic wound. There is been a nice amount of epithelialization here. 2 open areas of the original area remains small circular areas superiorly and a more larger area and fully inferiorly. Surface slough debrided with a #5 curette underlying granulation tissue looks healthy no evidence of surrounding infection Electronic Signature(s) Signed: 01/10/2023 5:27:16 PM By: [name] Najjar MD Entered By: [name] on 01/10/2023 13:27:55 -------------------------------------------------------------------------------- Physician Orders Details Patient Name: Date of Service: [name] 01/10/2023 3:45 PM Medical Record Number: [id number] Patient Account Number: [id number] Date of Birth/Sex: Treating RN: 1965-05-15 (57 y.o. [name] Primary Care Provider: Baruch [name] Other Clinician: Referring Provider: Treating Provider/Extender: [name] in Treatment: 11 Verbal / Phone Orders: No Diagnosis Coding Follow-up Appointments ppointment in 1 week. - w/ Dr. Leanord [name] Room 9 Tuesday 01/15/23 at 10:15am Return A Please make  appt. Anesthetic (In clinic) Topical Lidocaine 5% applied to wound bed Bathing/ Shower/ Hygiene May shower with protection but do not get wound dressing(s) wet. Protect dressing(s) with water repellant cover (for example, large plastic bag) or a cast cover and may then take shower. - may purchase cast protector from [facility], [facility] or [employer] Edema Control - Lymphedema / SCD / Other Elevate legs to the level of the heart or above for 30 minutes daily and/or when sitting for 3-4 times a day throughout the day. Avoid standing for long periods of time. Exercise regularly Moisturize legs daily. Additional Orders / Instructions Follow Nutritious Diet - closely monitor blood sugar. increase protein levels. Wound Treatment Wound #4 - Lower Leg Wound Laterality: Left, Anterior Cleanser: Wound Cleanser (Generic) 1 x Per Week/30 Days Discharge Instructions: Cleanse the wound with wound cleanser prior to applying a clean dressing using gauze sponges, not tissue or cotton balls. Peri-Wound Care: Sween Lotion (Moisturizing lotion) 1 x Per Week/30 Days Discharge Instructions: Apply moisturizing lotion as directed Prim Dressing: IODOFLEX 0.9% Cadexomer Iodine Pad 4x6 cm 1 x Per Week/30 Days ary Discharge Instructions: Apply to wound bed as instructed Secondary Dressing: T Non-Adherent Dressing, 3x4 in 1 x Per Week/30 Days elfa Discharge Instructions: Apply over primary dressing as directed. Compression Wrap: Urgo K2 Lite, (equivalent to a 3 layer) two layer compression system, regular 1 x Per Week/30 Days Discharge Instructions: Apply Urgo K2 Lite as directed (alternative to 3 layer compression). Electronic Signature(s) Signed: 01/10/2023 5:22:21 PM By: [name] Schwalbe RN Signed: 01/10/2023 5:27:16 PM By: [name] Najjar MD Entered By: [name] on 01/10/2023 13:24:38 Hazell, [name] ([id number]) [id number] Page 5 of  9 -------------------------------------------------------------------------------- Problem List Details Patient Name: Date of Service: [name] 01/10/2023 3:45 PM Medical Record Number: [id number] Patient Account Number: [id number] Date of Birth/Sex: Treating RN: 01-22-1966 (57 y.o. 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 10/25/2022 No Yes severity I87.312 Chronic venous hypertension (idiopathic) with ulcer of left lower extremity 12/06/2022 No Yes E11.622 Type 2 diabetes mellitus with other skin ulcer 10/25/2022 No Yes E11.22 Type 2 diabetes mellitus with diabetic chronic kidney disease 10/25/2022 No Yes N18.6 End stage renal disease 12/18/2022 No Yes Inactive Problems ICD-10 Code Description Active Date Inactive Date R09.02 Hypoxemia 10/25/2022 10/25/2022 Resolved Problems Electronic Signature(s) Signed: 01/10/2023 5:27:16 PM By: [name] Najjar MD Entered By: [name] on 01/10/2023 13:25:16 -------------------------------------------------------------------------------- Progress Note Details Patient Name: Date

## 2023-01-15 ENCOUNTER — Encounter (HOSPITAL_BASED_OUTPATIENT_CLINIC_OR_DEPARTMENT_OTHER): Payer: Medicare Other | Admitting: Internal Medicine

## 2023-01-15 DIAGNOSIS — I87312 Chronic venous hypertension (idiopathic) with ulcer of left lower extremity: Secondary | ICD-10-CM | POA: Diagnosis not present

## 2023-01-16 NOTE — Progress Notes (Addendum)
large plastic bag) or a cast cover and may then take shower. - may purchase cast protector from [facility], [facility] or [employer] Edema Control - Lymphedema / SCD / Other Elevate legs to the level of the heart or above for 30 minutes daily and/or when sitting for 3-4 times a day throughout the day. Avoid standing for long periods of time. Exercise regularly Moisturize legs daily. Additional Orders / Instructions Follow Nutritious Diet - closely monitor blood sugar. increase protein levels. Wound Treatment Wound #4 - Lower Leg Wound Laterality: Left, Anterior, Distal Cleanser: Wound Cleanser (Generic) 1 x Per Week/30 Days Tranchina, Rylynn ([id number]) [id number] Page 4 of 9 Discharge Instructions: Cleanse the wound with wound cleanser prior to applying a clean dressing using gauze sponges, not tissue or cotton balls. Peri-Wound Care: Sween Lotion (Moisturizing lotion) 1 x Per Week/30 Days Discharge Instructions: Apply moisturizing lotion as directed Prim Dressing: IODOFLEX 0.9% Cadexomer Iodine Pad 4x6 cm 1 x Per Week/30 Days ary Discharge Instructions: Apply to wound bed as instructed Secondary Dressing: T Non-Adherent Dressing, 3x4 in 1 x Per Week/30 Days elfa Discharge Instructions: Apply over primary dressing as directed. Compression Wrap: Urgo K2 Lite, (equivalent to a 3 layer) two layer compression system, regular 1 x Per Week/30 Days Discharge Instructions: Apply Urgo  K2 Lite as directed (alternative to 3 layer compression). Wound #5 - Lower Leg Wound Laterality: Left, Anterior, Proximal Cleanser: Wound Cleanser (Generic) 1 x Per Week/30 Days Discharge Instructions: Cleanse the wound with wound cleanser prior to applying a clean dressing using gauze sponges, not tissue or cotton balls. Peri-Wound Care: Sween Lotion (Moisturizing lotion) 1 x Per Week/30 Days Discharge Instructions: Apply moisturizing lotion as directed Prim Dressing: IODOFLEX 0.9% Cadexomer Iodine Pad 4x6 cm 1 x Per Week/30 Days ary Discharge Instructions: Apply to wound bed as instructed Secondary Dressing: T Non-Adherent Dressing, 3x4 in 1 x Per Week/30 Days elfa Discharge Instructions: Apply over primary dressing as directed. Compression Wrap: Urgo K2 Lite, (equivalent to a 3 layer) two layer compression system, regular 1 x Per Week/30 Days Discharge Instructions: Apply Urgo K2 Lite as directed (alternative to 3 layer compression). Electronic Signature(s) Signed: 01/15/2023 2:48:47 PM By: Tommie Ard RN Signed: 01/16/2023 9:33:55 AM By: Baltazar Najjar MD Entered By: Tommie Ard on 01/15/2023 11:45:50 -------------------------------------------------------------------------------- Problem List Details Patient Name: Date of Service: [name], [name] 01/15/2023 1:30 PM Medical Record Number: [id number] Patient Account Number: [id number] Date of Birth/Sex: Treating RN: [birth year] (57 y.o. 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 10/25/2022 No Yes severity I87.312 Chronic venous hypertension (idiopathic) with ulcer of left lower extremity 12/06/2022 No Yes E11.622 Type 2 diabetes mellitus with other skin  ulcer 10/25/2022 No Yes E11.22 Type 2 diabetes mellitus with diabetic chronic kidney disease 10/25/2022 No Yes [name], [name] ([id number]) [id number] Page 5 of 9 N18.6 End stage renal disease 12/18/2022 No Yes Inactive Problems ICD-10 Code Description Active Date Inactive Date R09.02 Hypoxemia 10/25/2022 10/25/2022 Resolved Problems Electronic Signature(s) Signed: 01/16/2023 9:33:55 AM By: Baltazar Najjar MD Entered By: Baltazar Najjar on 01/15/2023 11:56:48 -------------------------------------------------------------------------------- Progress Note Details Patient Name: Date of Service: [name], [name] 01/15/2023 1:30 PM Medical Record Number: [id number] Patient Account Number: [id number] Date of Birth/Sex: Treating RN: 09/30/1965 (57 y.o. M) Primary Care Provider: Baruch Goldmann Other Clinician: Referring Provider:  on 8/23 for worsening left lower extremity wound and was admitted and treated with IV ceftazidime and this was continued on discharged during dialysis. (Apparently he has been on IV ceftazidime prior to admission for outpatient cultures growing Pseudomonas bacteremia) Wound is larger but patient denies systemic signs of infection. He had ABIs completed that showed a normal left ABI and normal toe brachial index. It is unclear what dressing he has been using for the past month. 9/10; patient presents for follow-up. We have been using PolyMem silver with antibiotic ointment under Kerlix/Coban. Even though the wound is larger it has healthy granulation tissue and depth has improved. He has no issues or complaints. He denies signs of infection. He has completed his course of antibiotics and dialysis. 9/17; this patient is using topical antibiotics under polymen kerlix Coban. The wound is large on the left anterior mid tibia area. It certainly does not have healthy granulation today. He complains of pain but no fever 9/26. The patient is using topical antibiotics under polymen until last week when I changed him to Iodoflex. We did an aggressive debridement last week although he did not tolerate this well. Still using Urgo K2 compression 10/3; continued nice improvement we have been using Iodoflex still under Urgo K2 compression. This was initially a traumatic wound probably some degree of chronic venous insufficiency. 10/8; the patient is still making nice progress using Iodoflex the wound is now separated into 2 areas. The largest is the most distal part small circular wound just above this. The rest of this is  epithelialized. Wound surface looks healthy. We are using Urgo K2 compression he is tolerating this well Electronic Signature(s) Signed: 01/16/2023 9:33:55 AM By: Baltazar Najjar MD Entered By: Baltazar Najjar on 01/15/2023 11:58:31 [name], [name] Bash ([id number]) [id number] Page 3 of 9 -------------------------------------------------------------------------------- Physical Exam Details Patient Name: Date of Service: [name], [name] 01/15/2023 1:30 PM Medical Record Number: [id number] Patient Account Number: [id number] Date of Birth/Sex: Treating RN: Dec 01, 1965 (57 y.o. M) Primary Care Provider: Baruch Goldmann Other Clinician: Referring Provider: Treating Provider/Extender: [name] Weeks in Treatment: 11 Constitutional Patient is hypertensive.. Pulse regular and within target range for patient.Marland Kitchen Respirations regular, non-labored and within target range.. Temperature is normal and within the target range for the patient.Marland Kitchen Appears in no distress. Notes Wound exam; Most of the area on the left anterior lower leg is healed probably about 40% still open.2 small open areas. The most superior part is a small round area. The distal part of the wound is still open but contracting. No debridement is required in any area no evidence of surrounding infection edema control is good Electronic Signature(s) Signed: 01/16/2023 9:33:55 AM By: Baltazar Najjar MD Entered By: Baltazar Najjar on 01/15/2023 12:10:47 -------------------------------------------------------------------------------- Physician Orders Details Patient Name: Date of Service: [name], [name] 01/15/2023 1:30 PM Medical Record Number: [id number] Patient Account Number: [id number] Date of Birth/Sex: Treating RN: 05/01/1965 (57 y.o. [name] Primary Care Provider: Baruch Goldmann Other Clinician: Referring Provider: Treating Provider/Extender: [name] in  Treatment: 11 The following information was scribed by: Tommie Ard The information was scribed for: Baltazar Najjar Verbal / Phone Orders: No Diagnosis Coding Follow-up Appointments ppointment in 1 week. - w/ Dr. Leanord Hawking Room 9 Tuesday 01/15/23 at 10:15am Return A Please make appt. Anesthetic (In clinic) Topical Lidocaine 5% applied to wound bed Bathing/ Shower/ Hygiene May shower with protection but do not get wound dressing(s) wet. Protect dressing(s) with water repellant cover (for example,  no reason to change the dressing but Hydrofera Blue will be the alternative if this stalls. Continue with Urgo K2 light compression. Electronic Signature(s) Signed: 01/16/2023 9:33:55 AM By: Baltazar Najjar MD Entered By: Baltazar Najjar on 01/15/2023 12:11:41 -------------------------------------------------------------------------------- SuperBill Details Patient Name: Date of Service: [name], [name] 01/15/2023 Medical Record Number: [id number] Patient Account Number: [id number] Date of Birth/Sex: Treating RN: 1965/12/08 (57 y.o. M) Primary Care Provider: Baruch Goldmann Other Clinician: Referring Provider: Treating Provider/Extender: [name] Weeks in Treatment: 11 Diagnosis Coding ICD-10 Codes Code Description [id number] Non-pressure  chronic ulcer of other part of left lower leg with other specified severity I87.312 Chronic venous hypertension (idiopathic) with ulcer of left lower extremity E11.622 Type 2 diabetes mellitus with other skin ulcer E11.22 Type 2 diabetes mellitus with diabetic chronic kidney disease Rastetter, Mat ([id number]) [id number] Page 9 of 9 N18.6 End stage renal disease Facility Procedures : CPT4 Code: 95638756 Description: (Facility Use Only) [id number] - APPLY MULTLAY COMPRS LWR LT LEG ICD-10 Diagnosis Description 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ver I87.312 Chronic venous hypertension (idiopathic) with  ulcer of left lower extremity E11.622 Type 2 diabetes mellitus with other skin ulcer Modifier: ity Quantity: 1 Physician Procedures : CPT4 Code Description Modifier [id number] 99213 - WC PHYS LEVEL 3 - EST PT ICD-10 Diagnosis Description 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 Continue with Urgo K2 light compression. Electronic Signature(s) Signed: 01/16/2023 9:33:55 AM By: Baltazar Najjar MD Entered By: Baltazar Najjar on 01/15/2023 12:11:41 -------------------------------------------------------------------------------- SuperBill Details Patient Name: Date of Service: [name], [name] 01/15/2023 Medical Record Number: [id number] Patient Account Number: [id number] Date of Birth/Sex: Treating RN: 1965/12/08 (57 y.o. M) Primary Care Provider: Baruch Goldmann Other Clinician: Referring Provider: Treating Provider/Extender: [name] Weeks in Treatment: 11 Diagnosis Coding ICD-10 Codes Code Description [id number] Non-pressure  chronic ulcer of other part of left lower leg with other specified severity I87.312 Chronic venous hypertension (idiopathic) with ulcer of left lower extremity E11.622 Type 2 diabetes mellitus with other skin ulcer E11.22 Type 2 diabetes mellitus with diabetic chronic kidney disease Rastetter, Mat ([id number]) [id number] Page 9 of 9 N18.6 End stage renal disease Facility Procedures : CPT4 Code: 95638756 Description: (Facility Use Only) [id number] - APPLY MULTLAY COMPRS LWR LT LEG ICD-10 Diagnosis Description 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ver I87.312 Chronic venous hypertension (idiopathic) with  ulcer of left lower extremity E11.622 Type 2 diabetes mellitus with other skin ulcer Modifier: ity Quantity: 1 Physician Procedures : CPT4 Code Description Modifier [id number] 99213 - WC PHYS LEVEL 3 - EST PT ICD-10 Diagnosis Description 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 - may purchase cast protector from [facility], [facility] or [employer] Edema Control - Lymphedema / SCD / Other Elevate legs to the level of the heart or above for 30 minutes daily and/or when sitting for 3-4 times a day throughout the day. Avoid standing for long periods of time. Exercise regularly Moisturize legs daily. Additional Orders / Instructions Follow Nutritious Diet - closely monitor blood sugar. increase protein levels. Wound Treatment Wound #4 - Lower Leg Wound Laterality: Left, Anterior, Distal Cleanser: Wound Cleanser (Generic) 1 x Per Week/30 Days Tranchina, Rylynn ([id number]) [id number] Page 4 of 9 Discharge Instructions: Cleanse the wound with wound cleanser prior to applying a clean dressing using gauze sponges, not tissue or cotton balls. Peri-Wound Care: Sween Lotion (Moisturizing lotion) 1 x Per Week/30 Days Discharge Instructions: Apply moisturizing lotion as directed Prim Dressing: IODOFLEX 0.9% Cadexomer Iodine Pad 4x6 cm 1 x Per Week/30 Days ary Discharge Instructions: Apply to wound bed as instructed Secondary Dressing: T Non-Adherent Dressing, 3x4 in 1 x Per Week/30 Days elfa Discharge Instructions: Apply over primary dressing as directed. Compression Wrap: Urgo K2 Lite, (equivalent to a 3 layer) two layer compression system, regular 1 x Per Week/30 Days Discharge Instructions: Apply Urgo  K2 Lite as directed (alternative to 3 layer compression). Wound #5 - Lower Leg Wound Laterality: Left, Anterior, Proximal Cleanser: Wound Cleanser (Generic) 1 x Per Week/30 Days Discharge Instructions: Cleanse the wound with wound cleanser prior to applying a clean dressing using gauze sponges, not tissue or cotton balls. Peri-Wound Care: Sween Lotion (Moisturizing lotion) 1 x Per Week/30 Days Discharge Instructions: Apply moisturizing lotion as directed Prim Dressing: IODOFLEX 0.9% Cadexomer Iodine Pad 4x6 cm 1 x Per Week/30 Days ary Discharge Instructions: Apply to wound bed as instructed Secondary Dressing: T Non-Adherent Dressing, 3x4 in 1 x Per Week/30 Days elfa Discharge Instructions: Apply over primary dressing as directed. Compression Wrap: Urgo K2 Lite, (equivalent to a 3 layer) two layer compression system, regular 1 x Per Week/30 Days Discharge Instructions: Apply Urgo K2 Lite as directed (alternative to 3 layer compression). Electronic Signature(s) Signed: 01/15/2023 2:48:47 PM By: Tommie Ard RN Signed: 01/16/2023 9:33:55 AM By: Baltazar Najjar MD Entered By: Tommie Ard on 01/15/2023 11:45:50 -------------------------------------------------------------------------------- Problem List Details Patient Name: Date of Service: [name], [name] 01/15/2023 1:30 PM Medical Record Number: [id number] Patient Account Number: [id number] Date of Birth/Sex: Treating RN: [birth year] (57 y.o. 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 10/25/2022 No Yes severity I87.312 Chronic venous hypertension (idiopathic) with ulcer of left lower extremity 12/06/2022 No Yes E11.622 Type 2 diabetes mellitus with other skin  ulcer 10/25/2022 No Yes E11.22 Type 2 diabetes mellitus with diabetic chronic kidney disease 10/25/2022 No Yes [name], [name] ([id number]) [id number] Page 5 of 9 N18.6 End stage renal disease 12/18/2022 No Yes Inactive Problems ICD-10 Code Description Active Date Inactive Date R09.02 Hypoxemia 10/25/2022 10/25/2022 Resolved Problems Electronic Signature(s) Signed: 01/16/2023 9:33:55 AM By: Baltazar Najjar MD Entered By: Baltazar Najjar on 01/15/2023 11:56:48 -------------------------------------------------------------------------------- Progress Note Details Patient Name: Date of Service: [name], [name] 01/15/2023 1:30 PM Medical Record Number: [id number] Patient Account Number: [id number] Date of Birth/Sex: Treating RN: 09/30/1965 (57 y.o. M) Primary Care Provider: Baruch Goldmann Other Clinician: Referring Provider:  no reason to change the dressing but Hydrofera Blue will be the alternative if this stalls. Continue with Urgo K2 light compression. Electronic Signature(s) Signed: 01/16/2023 9:33:55 AM By: Baltazar Najjar MD Entered By: Baltazar Najjar on 01/15/2023 12:11:41 -------------------------------------------------------------------------------- SuperBill Details Patient Name: Date of Service: [name], [name] 01/15/2023 Medical Record Number: [id number] Patient Account Number: [id number] Date of Birth/Sex: Treating RN: 1965/12/08 (57 y.o. M) Primary Care Provider: Baruch Goldmann Other Clinician: Referring Provider: Treating Provider/Extender: [name] Weeks in Treatment: 11 Diagnosis Coding ICD-10 Codes Code Description [id number] Non-pressure  chronic ulcer of other part of left lower leg with other specified severity I87.312 Chronic venous hypertension (idiopathic) with ulcer of left lower extremity E11.622 Type 2 diabetes mellitus with other skin ulcer E11.22 Type 2 diabetes mellitus with diabetic chronic kidney disease Rastetter, Mat ([id number]) [id number] Page 9 of 9 N18.6 End stage renal disease Facility Procedures : CPT4 Code: 95638756 Description: (Facility Use Only) [id number] - APPLY MULTLAY COMPRS LWR LT LEG ICD-10 Diagnosis Description 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ver I87.312 Chronic venous hypertension (idiopathic) with  ulcer of left lower extremity E11.622 Type 2 diabetes mellitus with other skin ulcer Modifier: ity Quantity: 1 Physician Procedures : CPT4 Code Description Modifier [id number] 99213 - WC PHYS LEVEL 3 - EST PT ICD-10 Diagnosis Description 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 - may purchase cast protector from [facility], [facility] or [facility] Edema Control - Lymphedema / SCD / Other Elevate legs to the level of the heart or above for 30 minutes daily and/or when sitting for 3-4 times a day throughout the day. Avoid standing for long periods of time. Exercise regularly Moisturize legs daily. Additional Orders / Instructions Follow Nutritious Diet - closely monitor blood sugar. increase protein levels. Wound Treatment Wound #4 - Lower Leg Wound Laterality: Left, Anterior, Distal Cleanser: Wound Cleanser (Generic) 1 x Per Week/30 Days Tranchina, Rylynn ([id number]) [id number] Page 4 of 9 Discharge Instructions: Cleanse the wound with wound cleanser prior to applying a clean dressing using gauze sponges, not tissue or cotton balls. Peri-Wound Care: Sween Lotion (Moisturizing lotion) 1 x Per Week/30 Days Discharge Instructions: Apply moisturizing lotion as directed Prim Dressing: IODOFLEX 0.9% Cadexomer Iodine Pad 4x6 cm 1 x Per Week/30 Days ary Discharge Instructions: Apply to wound bed as instructed Secondary Dressing: T Non-Adherent Dressing, 3x4 in 1 x Per Week/30 Days elfa Discharge Instructions: Apply over primary dressing as directed. Compression Wrap: Urgo K2 Lite, (equivalent to a 3 layer) two layer compression system, regular 1 x Per Week/30 Days Discharge Instructions: Apply Urgo  K2 Lite as directed (alternative to 3 layer compression). Wound #5 - Lower Leg Wound Laterality: Left, Anterior, Proximal Cleanser: Wound Cleanser (Generic) 1 x Per Week/30 Days Discharge Instructions: Cleanse the wound with wound cleanser prior to applying a clean dressing using gauze sponges, not tissue or cotton balls. Peri-Wound Care: Sween Lotion (Moisturizing lotion) 1 x Per Week/30 Days Discharge Instructions: Apply moisturizing lotion as directed Prim Dressing: IODOFLEX 0.9% Cadexomer Iodine Pad 4x6 cm 1 x Per Week/30 Days ary Discharge Instructions: Apply to wound bed as instructed Secondary Dressing: T Non-Adherent Dressing, 3x4 in 1 x Per Week/30 Days elfa Discharge Instructions: Apply over primary dressing as directed. Compression Wrap: Urgo K2 Lite, (equivalent to a 3 layer) two layer compression system, regular 1 x Per Week/30 Days Discharge Instructions: Apply Urgo K2 Lite as directed (alternative to 3 layer compression). Electronic Signature(s) Signed: 01/15/2023 2:48:47 PM By: Tommie Ard RN Signed: 01/16/2023 9:33:55 AM By: Baltazar Najjar MD Entered By: Tommie Ard on 01/15/2023 11:45:50 -------------------------------------------------------------------------------- Problem List Details Patient Name: Date of Service: [name], [name] 01/15/2023 1:30 PM Medical Record Number: [id number] Patient Account Number: [id number] Date of Birth/Sex: Treating RN: [birth year] (57 y.o. 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 10/25/2022 No Yes severity I87.312 Chronic venous hypertension (idiopathic) with ulcer of left lower extremity 12/06/2022 No Yes E11.622 Type 2 diabetes mellitus with other skin  ulcer 10/25/2022 No Yes E11.22 Type 2 diabetes mellitus with diabetic chronic kidney disease 10/25/2022 No Yes [name], [name] ([id number]) [id number] Page 5 of 9 N18.6 End stage renal disease 12/18/2022 No Yes Inactive Problems ICD-10 Code Description Active Date Inactive Date R09.02 Hypoxemia 10/25/2022 10/25/2022 Resolved Problems Electronic Signature(s) Signed: 01/16/2023 9:33:55 AM By: Baltazar Najjar MD Entered By: Baltazar Najjar on 01/15/2023 11:56:48 -------------------------------------------------------------------------------- Progress Note Details Patient Name: Date of Service: [name], [name] 01/15/2023 1:30 PM Medical Record Number: [id number] Patient Account Number: [id number] Date of Birth/Sex: Treating RN: 09/30/1965 (57 y.o. M) Primary Care Provider: Baruch Goldmann Other Clinician: Referring Provider:  on 8/23 for worsening left lower extremity wound and was admitted and treated with IV ceftazidime and this was continued on discharged during dialysis. (Apparently he has been on IV ceftazidime prior to admission for outpatient cultures growing Pseudomonas bacteremia) Wound is larger but patient denies systemic signs of infection. He had ABIs completed that showed a normal left ABI and normal toe brachial index. It is unclear what dressing he has been using for the past month. 9/10; patient presents for follow-up. We have been using PolyMem silver with antibiotic ointment under Kerlix/Coban. Even though the wound is larger it has healthy granulation tissue and depth has improved. He has no issues or complaints. He denies signs of infection. He has completed his course of antibiotics and dialysis. 9/17; this patient is using topical antibiotics under polymen kerlix Coban. The wound is large on the left anterior mid tibia area. It certainly does not have healthy granulation today. He complains of pain but no fever 9/26. The patient is using topical antibiotics under polymen until last week when I changed him to Iodoflex. We did an aggressive debridement last week although he did not tolerate this well. Still using Urgo K2 compression 10/3; continued nice improvement we have been using Iodoflex still under Urgo K2 compression. This was initially a traumatic wound probably some degree of chronic venous insufficiency. 10/8; the patient is still making nice progress using Iodoflex the wound is now separated into 2 areas. The largest is the most distal part small circular wound just above this. The rest of this is  epithelialized. Wound surface looks healthy. We are using Urgo K2 compression he is tolerating this well Electronic Signature(s) Signed: 01/16/2023 9:33:55 AM By: Baltazar Najjar MD Entered By: Baltazar Najjar on 01/15/2023 11:58:31 [name], [name] Bash ([id number]) [id number] Page 3 of 9 -------------------------------------------------------------------------------- Physical Exam Details Patient Name: Date of Service: [name], [name] 01/15/2023 1:30 PM Medical Record Number: [id number] Patient Account Number: [id number] Date of Birth/Sex: Treating RN: Dec 01, 1965 (57 y.o. M) Primary Care Provider: Baruch Goldmann Other Clinician: Referring Provider: Treating Provider/Extender: [name] Weeks in Treatment: 11 Constitutional Patient is hypertensive.. Pulse regular and within target range for patient.Marland Kitchen Respirations regular, non-labored and within target range.. Temperature is normal and within the target range for the patient.Marland Kitchen Appears in no distress. Notes Wound exam; Most of the area on the left anterior lower leg is healed probably about 40% still open.2 small open areas. The most superior part is a small round area. The distal part of the wound is still open but contracting. No debridement is required in any area no evidence of surrounding infection edema control is good Electronic Signature(s) Signed: 01/16/2023 9:33:55 AM By: Baltazar Najjar MD Entered By: Baltazar Najjar on 01/15/2023 12:10:47 -------------------------------------------------------------------------------- Physician Orders Details Patient Name: Date of Service: [name], [name] 01/15/2023 1:30 PM Medical Record Number: [id number] Patient Account Number: [id number] Date of Birth/Sex: Treating RN: 05/01/1965 (57 y.o. [name] Primary Care Provider: Baruch Goldmann Other Clinician: Referring Provider: Treating Provider/Extender: [name] in  Treatment: 11 The following information was scribed by: Tommie Ard The information was scribed for: Baltazar Najjar Verbal / Phone Orders: No Diagnosis Coding Follow-up Appointments ppointment in 1 week. - w/ Dr. Leanord Hawking Room 9 Tuesday 01/15/23 at 10:15am Return A Please make appt. Anesthetic (In clinic) Topical Lidocaine 5% applied to wound bed Bathing/ Shower/ Hygiene May shower with protection but do not get wound dressing(s) wet. Protect dressing(s) with water repellant cover (for example,  on 8/23 for worsening left lower extremity wound and was admitted and treated with IV ceftazidime and this was continued on discharged during dialysis. (Apparently he has been on IV ceftazidime prior to admission for outpatient cultures growing Pseudomonas bacteremia) Wound is larger but patient denies systemic signs of infection. He had ABIs completed that showed a normal left ABI and normal toe brachial index. It is unclear what dressing he has been using for the past month. 9/10; patient presents for follow-up. We have been using PolyMem silver with antibiotic ointment under Kerlix/Coban. Even though the wound is larger it has healthy granulation tissue and depth has improved. He has no issues or complaints. He denies signs of infection. He has completed his course of antibiotics and dialysis. 9/17; this patient is using topical antibiotics under polymen kerlix Coban. The wound is large on the left anterior mid tibia area. It certainly does not have healthy granulation today. He complains of pain but no fever 9/26. The patient is using topical antibiotics under polymen until last week when I changed him to Iodoflex. We did an aggressive debridement last week although he did not tolerate this well. Still using Urgo K2 compression 10/3; continued nice improvement we have been using Iodoflex still under Urgo K2 compression. This was initially a traumatic wound probably some degree of chronic venous insufficiency. 10/8; the patient is still making nice progress using Iodoflex the wound is now separated into 2 areas. The largest is the most distal part small circular wound just above this. The rest of this is  epithelialized. Wound surface looks healthy. We are using Urgo K2 compression he is tolerating this well Electronic Signature(s) Signed: 01/16/2023 9:33:55 AM By: Baltazar Najjar MD Entered By: Baltazar Najjar on 01/15/2023 11:58:31 [name], [name] Bash ([id number]) [id number] Page 3 of 9 -------------------------------------------------------------------------------- Physical Exam Details Patient Name: Date of Service: [name], [name] 01/15/2023 1:30 PM Medical Record Number: [id number] Patient Account Number: [id number] Date of Birth/Sex: Treating RN: Dec 01, 1965 (57 y.o. M) Primary Care Provider: Baruch Goldmann Other Clinician: Referring Provider: Treating Provider/Extender: [name] Weeks in Treatment: 11 Constitutional Patient is hypertensive.. Pulse regular and within target range for patient.Marland Kitchen Respirations regular, non-labored and within target range.. Temperature is normal and within the target range for the patient.Marland Kitchen Appears in no distress. Notes Wound exam; Most of the area on the left anterior lower leg is healed probably about 40% still open.2 small open areas. The most superior part is a small round area. The distal part of the wound is still open but contracting. No debridement is required in any area no evidence of surrounding infection edema control is good Electronic Signature(s) Signed: 01/16/2023 9:33:55 AM By: Baltazar Najjar MD Entered By: Baltazar Najjar on 01/15/2023 12:10:47 -------------------------------------------------------------------------------- Physician Orders Details Patient Name: Date of Service: [name], [name] 01/15/2023 1:30 PM Medical Record Number: [id number] Patient Account Number: [id number] Date of Birth/Sex: Treating RN: 05/01/1965 (57 y.o. [name] Primary Care Provider: Baruch Goldmann Other Clinician: Referring Provider: Treating Provider/Extender: [name] in  Treatment: 11 The following information was scribed by: Tommie Ard The information was scribed for: Baltazar Najjar Verbal / Phone Orders: No Diagnosis Coding Follow-up Appointments ppointment in 1 week. - w/ Dr. Leanord Hawking Room 9 Tuesday 01/15/23 at 10:15am Return A Please make appt. Anesthetic (In clinic) Topical Lidocaine 5% applied to wound bed Bathing/ Shower/ Hygiene May shower with protection but do not get wound dressing(s) wet. Protect dressing(s) with water repellant cover (for example,  large plastic bag) or a cast cover and may then take shower. - may purchase cast protector from [facility], [facility] or [employer] Edema Control - Lymphedema / SCD / Other Elevate legs to the level of the heart or above for 30 minutes daily and/or when sitting for 3-4 times a day throughout the day. Avoid standing for long periods of time. Exercise regularly Moisturize legs daily. Additional Orders / Instructions Follow Nutritious Diet - closely monitor blood sugar. increase protein levels. Wound Treatment Wound #4 - Lower Leg Wound Laterality: Left, Anterior, Distal Cleanser: Wound Cleanser (Generic) 1 x Per Week/30 Days Tranchina, Rylynn ([id number]) [id number] Page 4 of 9 Discharge Instructions: Cleanse the wound with wound cleanser prior to applying a clean dressing using gauze sponges, not tissue or cotton balls. Peri-Wound Care: Sween Lotion (Moisturizing lotion) 1 x Per Week/30 Days Discharge Instructions: Apply moisturizing lotion as directed Prim Dressing: IODOFLEX 0.9% Cadexomer Iodine Pad 4x6 cm 1 x Per Week/30 Days ary Discharge Instructions: Apply to wound bed as instructed Secondary Dressing: T Non-Adherent Dressing, 3x4 in 1 x Per Week/30 Days elfa Discharge Instructions: Apply over primary dressing as directed. Compression Wrap: Urgo K2 Lite, (equivalent to a 3 layer) two layer compression system, regular 1 x Per Week/30 Days Discharge Instructions: Apply Urgo  K2 Lite as directed (alternative to 3 layer compression). Wound #5 - Lower Leg Wound Laterality: Left, Anterior, Proximal Cleanser: Wound Cleanser (Generic) 1 x Per Week/30 Days Discharge Instructions: Cleanse the wound with wound cleanser prior to applying a clean dressing using gauze sponges, not tissue or cotton balls. Peri-Wound Care: Sween Lotion (Moisturizing lotion) 1 x Per Week/30 Days Discharge Instructions: Apply moisturizing lotion as directed Prim Dressing: IODOFLEX 0.9% Cadexomer Iodine Pad 4x6 cm 1 x Per Week/30 Days ary Discharge Instructions: Apply to wound bed as instructed Secondary Dressing: T Non-Adherent Dressing, 3x4 in 1 x Per Week/30 Days elfa Discharge Instructions: Apply over primary dressing as directed. Compression Wrap: Urgo K2 Lite, (equivalent to a 3 layer) two layer compression system, regular 1 x Per Week/30 Days Discharge Instructions: Apply Urgo K2 Lite as directed (alternative to 3 layer compression). Electronic Signature(s) Signed: 01/15/2023 2:48:47 PM By: Tommie Ard RN Signed: 01/16/2023 9:33:55 AM By: Baltazar Najjar MD Entered By: Tommie Ard on 01/15/2023 11:45:50 -------------------------------------------------------------------------------- Problem List Details Patient Name: Date of Service: [name], [name] 01/15/2023 1:30 PM Medical Record Number: [id number] Patient Account Number: [id number] Date of Birth/Sex: Treating RN: [birth year] (57 y.o. 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 10/25/2022 No Yes severity I87.312 Chronic venous hypertension (idiopathic) with ulcer of left lower extremity 12/06/2022 No Yes E11.622 Type 2 diabetes mellitus with other skin  ulcer 10/25/2022 No Yes E11.22 Type 2 diabetes mellitus with diabetic chronic kidney disease 10/25/2022 No Yes [name], [name] ([id number]) [id number] Page 5 of 9 N18.6 End stage renal disease 12/18/2022 No Yes Inactive Problems ICD-10 Code Description Active Date Inactive Date R09.02 Hypoxemia 10/25/2022 10/25/2022 Resolved Problems Electronic Signature(s) Signed: 01/16/2023 9:33:55 AM By: Baltazar Najjar MD Entered By: Baltazar Najjar on 01/15/2023 11:56:48 -------------------------------------------------------------------------------- Progress Note Details Patient Name: Date of Service: [name], [name] 01/15/2023 1:30 PM Medical Record Number: [id number] Patient Account Number: [id number] Date of Birth/Sex: Treating RN: 09/30/1965 (57 y.o. M) Primary Care Provider: Baruch Goldmann Other Clinician: Referring Provider:  on 8/23 for worsening left lower extremity wound and was admitted and treated with IV ceftazidime and this was continued on discharged during dialysis. (Apparently he has been on IV ceftazidime prior to admission for outpatient cultures growing Pseudomonas bacteremia) Wound is larger but patient denies systemic signs of infection. He had ABIs completed that showed a normal left ABI and normal toe brachial index. It is unclear what dressing he has been using for the past month. 9/10; patient presents for follow-up. We have been using PolyMem silver with antibiotic ointment under Kerlix/Coban. Even though the wound is larger it has healthy granulation tissue and depth has improved. He has no issues or complaints. He denies signs of infection. He has completed his course of antibiotics and dialysis. 9/17; this patient is using topical antibiotics under polymen kerlix Coban. The wound is large on the left anterior mid tibia area. It certainly does not have healthy granulation today. He complains of pain but no fever 9/26. The patient is using topical antibiotics under polymen until last week when I changed him to Iodoflex. We did an aggressive debridement last week although he did not tolerate this well. Still using Urgo K2 compression 10/3; continued nice improvement we have been using Iodoflex still under Urgo K2 compression. This was initially a traumatic wound probably some degree of chronic venous insufficiency. 10/8; the patient is still making nice progress using Iodoflex the wound is now separated into 2 areas. The largest is the most distal part small circular wound just above this. The rest of this is  epithelialized. Wound surface looks healthy. We are using Urgo K2 compression he is tolerating this well Electronic Signature(s) Signed: 01/16/2023 9:33:55 AM By: Baltazar Najjar MD Entered By: Baltazar Najjar on 01/15/2023 11:58:31 [name], [name] Bash ([id number]) [id number] Page 3 of 9 -------------------------------------------------------------------------------- Physical Exam Details Patient Name: Date of Service: [name], [name] 01/15/2023 1:30 PM Medical Record Number: [id number] Patient Account Number: [id number] Date of Birth/Sex: Treating RN: Dec 01, 1965 (57 y.o. M) Primary Care Provider: Baruch Goldmann Other Clinician: Referring Provider: Treating Provider/Extender: [name] Weeks in Treatment: 11 Constitutional Patient is hypertensive.. Pulse regular and within target range for patient.Marland Kitchen Respirations regular, non-labored and within target range.. Temperature is normal and within the target range for the patient.Marland Kitchen Appears in no distress. Notes Wound exam; Most of the area on the left anterior lower leg is healed probably about 40% still open.2 small open areas. The most superior part is a small round area. The distal part of the wound is still open but contracting. No debridement is required in any area no evidence of surrounding infection edema control is good Electronic Signature(s) Signed: 01/16/2023 9:33:55 AM By: Baltazar Najjar MD Entered By: Baltazar Najjar on 01/15/2023 12:10:47 -------------------------------------------------------------------------------- Physician Orders Details Patient Name: Date of Service: [name], [name] 01/15/2023 1:30 PM Medical Record Number: [id number] Patient Account Number: [id number] Date of Birth/Sex: Treating RN: 05/01/1965 (57 y.o. [name] Primary Care Provider: Baruch Goldmann Other Clinician: Referring Provider: Treating Provider/Extender: [name] in  Treatment: 11 The following information was scribed by: Tommie Ard The information was scribed for: Baltazar Najjar Verbal / Phone Orders: No Diagnosis Coding Follow-up Appointments ppointment in 1 week. - w/ Dr. Leanord Hawking Room 9 Tuesday 01/15/23 at 10:15am Return A Please make appt. Anesthetic (In clinic) Topical Lidocaine 5% applied to wound bed Bathing/ Shower/ Hygiene May shower with protection but do not get wound dressing(s) wet. Protect dressing(s) with water repellant cover (for example,  on 8/23 for worsening left lower extremity wound and was admitted and treated with IV ceftazidime and this was continued on discharged during dialysis. (Apparently he has been on IV ceftazidime prior to admission for outpatient cultures growing Pseudomonas bacteremia) Wound is larger but patient denies systemic signs of infection. He had ABIs completed that showed a normal left ABI and normal toe brachial index. It is unclear what dressing he has been using for the past month. 9/10; patient presents for follow-up. We have been using PolyMem silver with antibiotic ointment under Kerlix/Coban. Even though the wound is larger it has healthy granulation tissue and depth has improved. He has no issues or complaints. He denies signs of infection. He has completed his course of antibiotics and dialysis. 9/17; this patient is using topical antibiotics under polymen kerlix Coban. The wound is large on the left anterior mid tibia area. It certainly does not have healthy granulation today. He complains of pain but no fever 9/26. The patient is using topical antibiotics under polymen until last week when I changed him to Iodoflex. We did an aggressive debridement last week although he did not tolerate this well. Still using Urgo K2 compression 10/3; continued nice improvement we have been using Iodoflex still under Urgo K2 compression. This was initially a traumatic wound probably some degree of chronic venous insufficiency. 10/8; the patient is still making nice progress using Iodoflex the wound is now separated into 2 areas. The largest is the most distal part small circular wound just above this. The rest of this is  epithelialized. Wound surface looks healthy. We are using Urgo K2 compression he is tolerating this well Electronic Signature(s) Signed: 01/16/2023 9:33:55 AM By: Baltazar Najjar MD Entered By: Baltazar Najjar on 01/15/2023 11:58:31 [name], [name] Bash ([id number]) [id number] Page 3 of 9 -------------------------------------------------------------------------------- Physical Exam Details Patient Name: Date of Service: [name], [name] 01/15/2023 1:30 PM Medical Record Number: [id number] Patient Account Number: [id number] Date of Birth/Sex: Treating RN: Dec 01, 1965 (57 y.o. M) Primary Care Provider: Baruch Goldmann Other Clinician: Referring Provider: Treating Provider/Extender: [name] Weeks in Treatment: 11 Constitutional Patient is hypertensive.. Pulse regular and within target range for patient.Marland Kitchen Respirations regular, non-labored and within target range.. Temperature is normal and within the target range for the patient.Marland Kitchen Appears in no distress. Notes Wound exam; Most of the area on the left anterior lower leg is healed probably about 40% still open.2 small open areas. The most superior part is a small round area. The distal part of the wound is still open but contracting. No debridement is required in any area no evidence of surrounding infection edema control is good Electronic Signature(s) Signed: 01/16/2023 9:33:55 AM By: Baltazar Najjar MD Entered By: Baltazar Najjar on 01/15/2023 12:10:47 -------------------------------------------------------------------------------- Physician Orders Details Patient Name: Date of Service: [name], [name] 01/15/2023 1:30 PM Medical Record Number: [id number] Patient Account Number: [id number] Date of Birth/Sex: Treating RN: 05/01/1965 (57 y.o. [name] Primary Care Provider: Baruch Goldmann Other Clinician: Referring Provider: Treating Provider/Extender: [name] in  Treatment: 11 The following information was scribed by: Tommie Ard The information was scribed for: Baltazar Najjar Verbal / Phone Orders: No Diagnosis Coding Follow-up Appointments ppointment in 1 week. - w/ Dr. Leanord Hawking Room 9 Tuesday 01/15/23 at 10:15am Return A Please make appt. Anesthetic (In clinic) Topical Lidocaine 5% applied to wound bed Bathing/ Shower/ Hygiene May shower with protection but do not get wound dressing(s) wet. Protect dressing(s) with water repellant cover (for example,  Amsden, Yanni ([id number]) [id number] Page 1 of 9 Visit Report for 01/15/2023 HPI Details Patient Name: Date of Service: [name], [name] 01/15/2023 1:30 PM Medical Record Number: [id number] Patient Account Number: [id number] Date of Birth/Sex: Treating RN: 1966-02-13 (57 y.o. M) Primary Care Provider: Baruch Goldmann Other Clinician: Referring Provider: Treating Provider/Extender: [name] Weeks in Treatment: 11 History of Present Illness HPI Description: ADMISSION 08/04/2019 This is a 57 year old man who traumatized his right lateral lower leg while going [place] an escal[place] at the mall sometime in December 2020. He was referred to vascular surgery and on 04/14/2019 he saw Dr. Raynelle Fanning. He was felt to have "palpable pulses and normal noninvasive studies" although I cannot see these results. He was back in his primary doctor's office on 07/30/2019 for a nonhealing right lower extremity wound. He was referred to wound care I think in [facility] but his insurance was out of network. A CNS was ordered. X-ray was done that was negative. He has been using wound cleanser and a Band-Aid. Past medical history is actually quite extensive and includes chronic kidney disease stage IV, chronic diastolic heart failure, atrial fibrillation, poorly controlled hypertension, poorly controlled diabetes with peripheral neuropathy and a recent hemoglobin A1c of 11.5, depression, lymphedema and sleep apnea ABI in our clinic was 1.1 in the right 5/6; this wound was initially traumatized while the patient was going down on an escalator. Arrived in clinic last week with a completely nonviable surface. He required mechanical debridement and we have been using Iodoflex under compression. Apparently the compression wraps fell down. He also has poorly controlled diabetes 5/15; traumatic/contusion wound to the right lateral lower leg. We have been using Iodoflex to clean at the  surface of this which was completely nonviable along with mechanical debridements. He is under compression. 09/02/2019 upon evaluation today patient appears to be doing well with regard to his wound on his right lateral leg. He has been tolerating the dressing changes without complication. Fortunately there is no signs of active infection at this time. No fevers, chills, nausea, vomiting, or diarrhea. With that being said there is some necrotic tissue around the edges of the wound that is and require some sharp debridement today. That was discussed with the patient and his mother in the office today prior to debridement to which she did consent. 09/09/2019 upon evaluation today patient appears to be making progress in regard to his wound. Fortunately the wound is not nearly as deep as what it was. It is measuring a little bit larger due to having to clear some of the edges away but again that is necessary to get this to heal. Fortunately there is no evidence of active infection at this time. No fevers, chills, nausea, vomiting, or diarrhea. 09/16/2019 on evaluation today patient appears to be doing excellent in regard to his wound currently. In fact I do not see anything that we can need to debride today which is great news. The wound bed looks healthy and the edges of the wound look like they are doing great. Overall I feel like we are at a good place and headed in the proper direction. 09/23/2019 upon evaluation today patient appears to be doing okay in regard to his wound. This is not measuring any smaller it is about the same. With that being said he tells me that his wrap actually slid down he ended up having to take this off he was on a trip and did not have his Velcro

## 2023-01-17 NOTE — Progress Notes (Signed)
RD, [name] 01/15/2023 1:30 PM Medical Record Number: [id number] Patient Account Number: [id number] Date of Birth/Sex: Treating RN: 1965/05/02 (57 y.o. M) Primary Care [name]: [name] Other Clinician: Referring [name]: Treating [name]/Extender: [name] Weeks in Treatment: 11 Vital Signs Time Taken: 13:53 Temperature (F): 98 Height (in): 67 Pulse (bpm): 76 Weight (lbs): 223 Respiratory Rate (breaths/min): 18 Body Mass  Index (BMI): 34.9 Blood Pressure (mmHg): 163/118 Reference Range: 80 - 120 mg / dl Electronic Signature(s) Signed: 01/16/2023 5:41:53 PM By: Thayer Dallas Entered By: Thayer Dallas on 01/15/2023 10:53:42  check if billed separately Has the patient been seen at the hospital within the last three years: Yes Total Score: 0 Level Of Care: ____ Electronic Signature(s) Signed: 01/15/2023 2:48:47 PM By: [name] Ard RN Entered By: [name] on 01/15/2023 11:46:35 -------------------------------------------------------------------------------- Compression Therapy Details Patient Name: Date of Service: [name], [name] 01/15/2023 1:30 PM Medical Record Number: [id number] Patient Account Number: [id number] Date of Birth/Sex: Treating RN: January 17, 1966 (57 y.o. [name] Primary Care [name]: [name] Other Clinician: Referring [name]: Treating [name]/Extender: [name] Weeks in Treatment: 11 Compression Therapy Performed for Wound Assessment: Wound #4 Left,Distal,Anterior Lower Leg Performed By: Clinician [name] Ard, RN Compression Type: Three Layer Post Procedure Diagnosis Same as Pre-procedure Electronic Signature(s) Signed: 01/15/2023 2:45:17 PM By: [name] Ard RN Entered By: [name] on 01/15/2023 11:45:17 [name], [name] ([id number]) [id number] Page 3 of 11 -------------------------------------------------------------------------------- Compression Therapy Details Patient Name: Date of Service: [name], [name] 01/15/2023 1:30 PM Medical Record Number: [id number] Patient Account Number: [id number] Date of Birth/Sex: Treating RN: 1965-08-11 (57 y.o. [name] Primary Care [name]: [name] Other Clinician: Referring [name]: Treating [name]/Extender: [name] Weeks in Treatment: 11 Compression Therapy Performed for Wound Assessment: Wound #5 Left,Proximal,Anterior Lower Leg Performed By: Clinician [name] Ard, RN Compression Type: Three  Layer Post Procedure Diagnosis Same as Pre-procedure Electronic Signature(s) Signed: 01/15/2023 2:45:17 PM By: [name] Ard RN Entered By: [name] on 01/15/2023 11:45:17 -------------------------------------------------------------------------------- Encounter Discharge Information Details Patient Name: Date of Service: [name], [name] 01/15/2023 1:30 PM Medical Record Number: [id number] Patient Account Number: [id number] Date of Birth/Sex: Treating RN: 03-25-1966 (57 y.o. [name] Primary Care [name]: [name] Other Clinician: Referring [name]: Treating [name]/Extender: [name] in Treatment: 11 Encounter Discharge Information Items Discharge Condition: Stable Ambulatory Status: Ambulatory Discharge Destination: Home Transportation: Private Auto Accompanied By: mother Schedule Follow-up Appointment: Yes Clinical Summary of Care: Electronic Signature(s) Signed: 01/15/2023 2:47:11 PM By: [name] Ard RN Entered By: [name] on 01/15/2023 11:47:11 -------------------------------------------------------------------------------- Lower Extremity Assessment Details Patient Name: Date of Service: [name], [name] 01/15/2023 1:30 PM Medical Record Number: [id number] Patient Account Number: [id number] Date of Birth/Sex: Treating RN: 02/22/1966 (57 y.o. M) Primary Care [name]: [name] Other Clinician: Referring [name]: Treating [name]/Extender: [name] Weeks in Treatment: 11 Edema Assessment Left: [Left: Right] [Right: :] Assessed: [Left: No] [Right: No] Edema: [Left: Ye] [Right: s] Calf Left: Right: Point of Measurement: 36 cm From Medial Instep 40.5 cm Ankle Left: Right: Point of Measurement: 12 cm From Medial Instep 23.2 cm Vascular Assessment Extremity colors, hair growth, and conditions: Extremity Color: [Left:Normal] Hair Growth on Extremity: [Left:No] Temperature of Extremity:  [Left:Warm] Capillary Refill: [Left:< 3 seconds] Dependent Rubor: [Left:No No] Electronic Signature(s) Signed: 01/16/2023 5:41:53 PM By: Thayer Dallas Entered By: Thayer Dallas on 01/15/2023 10:54:00 -------------------------------------------------------------------------------- Multi Wound Chart Details Patient Name: Date of Service: [name], [name] 01/15/2023 1:30 PM Medical Record Number: [id number] Patient Account Number: [id number] Date of Birth/Sex: Treating RN: [birth year] (57 y.o. 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 N/A Primary Etiology: Extremity Venous Leg Ulcer N/A N/A  Secondary Etiology: Lymphedema, Sleep Apnea, Lymphedema, Sleep Apnea, N/A Comorbid History: Arrhythmia, Congestive Heart Failure, Arrhythmia, Congestive Heart Failure, Hypertension, Peripheral Arterial Hypertension, Peripheral Arterial Disease, Type II Diabetes, End Stage Disease, Type II Diabetes, End Stage Renal Disease Renal Disease 08/10/2022 01/15/2023 N/A Date Acquired: 11 0 N/A Weeks of Treatment: Open Open N/A Wound Status: [name], [name] ([id number]) [id number] Page 5 of 11 No No N/A Wound Recurrence: 5.8x4x0.1 1x1.2x0.1 N/A Measurements L x W x D (cm) 18.221 0.942 N/A A (cm) : rea 1.822 0.094 N/A Volume (cm) : -106.20% N/A N/A % Reduction in Area: -106.10% N/A N/A % Reduction in Volume: Grade 1 Full Thickness Without Exposed  N/A Classification: Support Structures Medium Medium N/A Exudate A mount: Serosanguineous Serosanguineous N/A Exudate Type: red, brown red, brown N/A Exudate Color: Distinct, outline attached N/A N/A Wound Margin: Medium (34-66%) Large (67-100%) N/A Granulation Amount: Red, Pink N/A N/A Granulation Quality: Medium (34-66%) Small (1-33%) N/A Necrotic Amount: Eschar, Adherent Slough Adherent Slough N/A Necrotic Tissue: Fat Layer (Subcutaneous Tissue): Yes Fat Layer (Subcutaneous Tissue): Yes N/A Exposed Structures: Fascia: No Fascia: No Tendon: No Tendon: No Muscle: No Muscle: No Joint: No Joint: No Bone: No Bone: No Small (1-33%) Small (1-33%) N/A Epithelialization: Excoriation: No Scarring: Yes N/A Periwound Skin Texture: Induration: No Callus: No Crepitus: No Rash: No Scarring: No Maceration: No Maceration: No N/A Periwound Skin Moisture: Dry/Scaly: No Dry/Scaly: No Hemosiderin Staining: Yes No Abnormalities Noted N/A Periwound Skin Color: Atrophie Blanche: No Cyanosis: No Ecchymosis: No Erythema: No Mottled: No Pallor: No Rubor: No N/A No Abnormality N/A Temperature: Compression Therapy Compression Therapy N/A Procedures Performed: Treatment Notes Wound #4 (Lower Leg) Wound Laterality: Left, Anterior, Distal Cleanser Wound Cleanser Discharge Instruction: Cleanse the wound with wound cleanser prior to applying a clean dressing using gauze sponges, not tissue or cotton balls. Peri-Wound Care Sween Lotion (Moisturizing lotion) Discharge Instruction: Apply moisturizing lotion as directed Topical Primary Dressing IODOFLEX 0.9% Cadexomer Iodine Pad 4x6 cm Discharge Instruction: Apply to wound bed as instructed Secondary Dressing T Non-Adherent Dressing, 3x4 in elfa Discharge Instruction: Apply over primary dressing as directed. Secured With Compression Wrap Urgo K2 Lite, (equivalent to a 3 layer) two layer compression system, regular Discharge  Instruction: Apply Urgo K2 Lite as directed (alternative to 3 layer compression). Compression Stockings Add-Ons Wound #5 (Lower Leg) Wound Laterality: Left, Anterior, Proximal Cleanser Wound Cleanser Discharge Instruction: Cleanse the wound with wound cleanser prior to applying a clean dressing using gauze sponges, not tissue or cotton balls. Peri-Wound Care [name], [name] ([id number]) [id number] Page 6 of 11 Sween Lotion (Moisturizing lotion) Discharge Instruction: Apply moisturizing lotion as directed Topical Primary Dressing IODOFLEX 0.9% Cadexomer Iodine Pad 4x6 cm Discharge Instruction: Apply to wound bed as instructed Secondary Dressing T Non-Adherent Dressing, 3x4 in elfa Discharge Instruction: Apply over primary dressing as directed. Secured With Compression Wrap Urgo K2 Lite, (equivalent to a 3 layer) two layer compression system, regular Discharge Instruction: Apply Urgo K2 Lite as directed (alternative to 3 layer compression). Compression Stockings Add-Ons Electronic Signature(s) Signed: 01/16/2023 9:33:55 AM By: Baltazar Najjar MD Entered By: Baltazar Najjar on 01/15/2023 11:57:35 -------------------------------------------------------------------------------- Multi-Disciplinary Care Plan Details Patient Name: Date of Service: [name], [name] 01/15/2023 1:30 PM Medical Record Number: [id number] Patient Account Number: [id number] Date of Birth/Sex: Treating RN: January 19, 1966 (57 y.o. [name] Primary Care Oria Klimas: [name] Other Clinician: Referring Aslin Farinas: Treating Sallee Hogrefe/Extender: [name] Weeks in Treatment: 11 Active Inactive Necrotic Tissue Nursing Diagnoses: Impaired tissue integrity related to necrotic/devitalized  [name], [name] ([id number]) [id number] Page 1 of 11 Visit Report for 01/15/2023 Arrival Information Details Patient Name: Date of Service: [name], [name] 01/15/2023 1:30 PM Medical Record Number: [id number] Patient Account Number: [id number] Date of Birth/Sex: Treating RN: [birth year] (57 y.o. M) Primary Care Grayer Sproles: [name] Other Clinician: Referring Payge Eppes: Treating Taresa [place]/Extender: [name] in Treatment: 11 Visit Information History Since Last Visit Added or deleted any medications: No Patient Arrived: Ambulatory Any new allergies or adverse reactions: No Arrival Time: 13:44 Had a fall or experienced change in No Accompanied By: mother, son activities of daily living that may affect Transfer Assistance: None risk of falls: Patient Identification Verified: Yes Signs or symptoms of abuse/neglect since last visito No Secondary Verification Process Completed: Yes Hospitalized since last visit: No Patient Requires Transmission-Based Precautions: No Implantable device outside of the clinic excluding No Patient Has Alerts: Yes cellular tissue based products placed in the center Patient Alerts: ABI in clinic [place] 7/24 since last visit: DO NOT USE LEFT ARM Has Dressing in Place as Prescribed: No Has Compression in Place as Prescribed: No Pain Present Now: Yes Electronic Signature(s) Signed: 01/16/2023 5:41:53 PM By: Thayer Dallas Entered By: Thayer Dallas on 01/15/2023 10:47:16 -------------------------------------------------------------------------------- Clinic Level of Care Assessment Details Patient Name: Date of Service: [name], [name] 01/15/2023 1:30 PM Medical Record Number: [id number] Patient Account Number: [id number] Date of Birth/Sex: Treating RN: 06/27/1965 (57 y.o. [name] Primary Care Durelle Zepeda: [name] Other Clinician: Referring Lachandra Dettmann: Treating Dawid Dupriest/Extender: [name] in Treatment: 11 Clinic Level of Care Assessment Items TOOL 1 Quantity Score []  - 0 Use when EandM and Procedure is performed on INITIAL visit ASSESSMENTS - Nursing Assessment / Reassessment []  - 0 General Physical Exam (combine w/ comprehensive assessment (listed just below) when performed on new pt. evals) []  - 0 Comprehensive Assessment (HX, ROS, Risk Assessments, Wounds Hx, etc.) ASSESSMENTS - Wound and Skin Assessment / Reassessment []  - 0 Dermatologic / Skin Assessment (not related to wound area) ASSESSMENTS - Ostomy and/or Continence Assessment and Care []  - 0 Incontinence Assessment and Management []  - 0 Ostomy Care Assessment and Management (repouching, etc.) PROCESS - Coordination of Care []  - 0 Simple Patient / Family Education for ongoing care [name], [name] ([id number]) [phone].pdf Page 2 of 11 []  - 0 Complex (extensive) Patient / Family Education for ongoing care []  - 0 Staff obtains Chiropractor, Records, T Results / Process Orders est []  - 0 Staff telephones HHA, Nursing Homes / Clarify orders / etc []  - 0 Routine Transfer to another Facility (non-emergent condition) []  - 0 Routine Hospital Admission (non-emergent condition) []  - 0 New Admissions / Manufacturing engineer / Ordering NPWT Apligraf, etc. , []  - 0 Emergency Hospital Admission (emergent condition) PROCESS - Special Needs []  - 0 Pediatric / Minor Patient Management []  - 0 Isolation Patient Management []  - 0 Hearing / Language / Visual special needs []  - 0 Assessment of Community assistance (transportation, D/C planning, etc.) []  - 0 Additional assistance / Altered mentation []  - 0 Support Surface(s) Assessment (bed, cushion, seat, etc.) INTERVENTIONS - Miscellaneous []  - 0 External ear exam []  - 0 Patient Transfer (multiple staff / Nurse, adult / Similar devices) []  - 0 Simple Staple / Suture removal (25 or less) []  - 0 Complex Staple / Suture  removal (26 or more) []  - 0 Hypo/Hyperglycemic Management (do not check if billed separately) []  - 0 Ankle / Brachial Index (ABI) - do not  tissue Goals: Necrotic/devitalized tissue will be minimized in the wound bed Date Initiated: 10/25/2022 Target Resolution Date: 04/05/2023 Goal Status: Active Interventions: Assess  patient pain level pre-, during and post procedure and prior to discharge Treatment Activities: Enzymatic debridement : 10/25/2022 T ordered outside of clinic : 10/25/2022 est Notes: Wound/Skin Impairment Nursing Diagnoses: Knowledge deficit related to ulceration/compromised skin integrity Goals: Patient/caregiver will verbalize understanding of skin care regimen Date Initiated: 10/25/2022 Target Resolution Date: 04/05/2023 Goal Status: Active [name], [name] ([id number]) [id number] Page 7 of 11 Ulcer/skin breakdown will heal within 14 weeks Date Initiated: 10/25/2022 Date Inactivated: 12/06/2022 Target Resolution Date: 11/16/2022 Goal Status: Unmet Unmet Reason: non compliance Interventions: Assess patient/caregiver ability to perform ulcer/skin care regimen upon admission and as needed Assess ulceration(s) every visit Provide education on ulcer and skin care Treatment Activities: Skin care regimen initiated : 10/25/2022 Topical wound management initiated : 10/25/2022 Notes: Electronic Signature(s) Signed: 01/15/2023 2:45:58 PM By: [name] Ard RN Entered By: [name] on 01/15/2023 11:45:58 -------------------------------------------------------------------------------- Pain Assessment Details Patient Name: Date of Service: [name], [name] 01/15/2023 1:30 PM Medical Record Number: [id number] Patient Account Number: [id number] Date of Birth/Sex: Treating RN: 24-Jun-1965 (57 y.o. M) Primary Care Stephannie Broner: [name] Other Clinician: Referring Velda Wendt: Treating Kalee Mcclenathan/Extender: [name] Weeks in Treatment: 11 Active Problems Location of Pain Severity and Description of Pain Patient Has Paino Yes Site Locations Pain Location: Pain in Ulcers Rate the pain. Current Pain Level: 6 Pain Management and Medication Current Pain Management: Electronic Signature(s) Signed: 01/16/2023 5:41:53 PM By: Thayer Dallas Entered By: Thayer Dallas on 01/15/2023 10:47:33 Muratore, [name] ([id number]) [id number] Page 8 of 11 -------------------------------------------------------------------------------- Patient/Caregiver Education Details Patient Name: Date of Service: [name], Rene Kocher [name] 10/8/2024andnbsp1:30 PM Medical Record Number: [id number] Patient Account Number: [id number] Date of Birth/Gender: Treating RN: [birth year] (57 y.o. [name] Primary Care Physician: [name] Other Clinician: Referring Physician: Treating Physician/Extender: [name] in Treatment: 11 Education Assessment Education Provided To: Patient Education Topics Provided Wound/Skin Impairment: Methods: Explain/Verbal Responses: Reinforcements needed, State content correctly Electronic Signature(s) Signed: 01/15/2023 2:48:47 PM By: [name] Ard RN Entered By: [name] on 01/15/2023 11:46:10 -------------------------------------------------------------------------------- Wound Assessment Details Patient Name: Date of Service: [name], [name] 01/15/2023 1:30 PM Medical Record Number: [id number] Patient Account Number: [id number] Date of Birth/Sex: Treating RN: 1965/10/08 (57 y.o. M) Primary Care Maribel Hadley: [name] Other Clinician: Referring Wendy Mikles: Treating Alexandr Oehler/Extender: [name] Weeks in Treatment: 11 Wound Status Wound Number: 4 Primary Diabetic Wound/Ulcer of the Lower Extremity Etiology: Wound Location: Left, Distal, Anterior Lower Leg Secondary Venous Leg Ulcer Wounding Event: Gradually Appeared Etiology: Date Acquired: 08/10/2022 Wound Open Weeks Of Treatment: 11 Status: Clustered Wound: No Comorbid Lymphedema, Sleep Apnea, Arrhythmia, Congestive Heart Failure, History: Hypertension, Peripheral Arterial Disease, Type II Diabetes, End Stage Renal Disease Photos Wound Measurements Length: (cm) 5.8 Width: (cm) 4 Depth: (cm) 0.1 Area: (cm)  18.2 Volume: (cm) 1.82 [name], [name] ([id number]) Wound Description Classification: Grade 1 Wound Margin: Distinct, outline attache Exudate Amount: Medium Exudate Type: Serosanguineous Exudate Color: red, brown Foul Odor After Cleansing: Slough/Fibrino % Reduction in Area: -106.2% % Reduction in Volume: -106.1% Epithelialization: Small (1-33%) 21 Tunneling: No 2 Undermining: No [id number] Page 9 of 11 d No Yes Wound Bed Granulation Amount: Medium (34-66%) Exposed Structure Granulation Quality: Red, Pink Fascia Exposed: No Necrotic Amount: Medium (34-66%) Fat Layer (Subcutaneous Tissue) Exposed: Yes Necrotic Quality: Eschar, Adherent Slough Tendon Exposed: No Muscle Exposed: No Joint Exposed: No Bone Exposed:  check if billed separately Has the patient been seen at the hospital within the last three years: Yes Total Score: 0 Level Of Care: ____ Electronic Signature(s) Signed: 01/15/2023 2:48:47 PM By: [name] Ard RN Entered By: [name] on 01/15/2023 11:46:35 -------------------------------------------------------------------------------- Compression Therapy Details Patient Name: Date of Service: [name], [name] 01/15/2023 1:30 PM Medical Record Number: [id number] Patient Account Number: [id number] Date of Birth/Sex: Treating RN: January 17, 1966 (57 y.o. [name] Primary Care [name]: [name] Other Clinician: Referring [name]: Treating [name]/Extender: [name] Weeks in Treatment: 11 Compression Therapy Performed for Wound Assessment: Wound #4 Left,Distal,Anterior Lower Leg Performed By: Clinician [name] Ard, RN Compression Type: Three Layer Post Procedure Diagnosis Same as Pre-procedure Electronic Signature(s) Signed: 01/15/2023 2:45:17 PM By: [name] Ard RN Entered By: [name] on 01/15/2023 11:45:17 [name], [name] ([id number]) [id number] Page 3 of 11 -------------------------------------------------------------------------------- Compression Therapy Details Patient Name: Date of Service: [name], [name] 01/15/2023 1:30 PM Medical Record Number: [id number] Patient Account Number: [id number] Date of Birth/Sex: Treating RN: 1965-08-11 (57 y.o. [name] Primary Care [name]: [name] Other Clinician: Referring [name]: Treating [name]/Extender: [name] Weeks in Treatment: 11 Compression Therapy Performed for Wound Assessment: Wound #5 Left,Proximal,Anterior Lower Leg Performed By: Clinician [name] Ard, RN Compression Type: Three  Layer Post Procedure Diagnosis Same as Pre-procedure Electronic Signature(s) Signed: 01/15/2023 2:45:17 PM By: [name] Ard RN Entered By: [name] on 01/15/2023 11:45:17 -------------------------------------------------------------------------------- Encounter Discharge Information Details Patient Name: Date of Service: [name], [name] 01/15/2023 1:30 PM Medical Record Number: [id number] Patient Account Number: [id number] Date of Birth/Sex: Treating RN: 03-25-1966 (57 y.o. [name] Primary Care [name]: [name] Other Clinician: Referring [name]: Treating [name]/Extender: [name] in Treatment: 11 Encounter Discharge Information Items Discharge Condition: Stable Ambulatory Status: Ambulatory Discharge Destination: Home Transportation: Private Auto Accompanied By: mother Schedule Follow-up Appointment: Yes Clinical Summary of Care: Electronic Signature(s) Signed: 01/15/2023 2:47:11 PM By: [name] Ard RN Entered By: [name] on 01/15/2023 11:47:11 -------------------------------------------------------------------------------- Lower Extremity Assessment Details Patient Name: Date of Service: [name], [name] 01/15/2023 1:30 PM Medical Record Number: [id number] Patient Account Number: [id number] Date of Birth/Sex: Treating RN: 02/22/1966 (57 y.o. M) Primary Care [name]: [name] Other Clinician: Referring [name]: Treating [name]/Extender: [name] Weeks in Treatment: 11 Edema Assessment Left: [Left: Right] [Right: :] Assessed: [Left: No] [Right: No] Edema: [Left: Ye] [Right: s] Calf Left: Right: Point of Measurement: 36 cm From Medial Instep 40.5 cm Ankle Left: Right: Point of Measurement: 12 cm From Medial Instep 23.2 cm Vascular Assessment Extremity colors, hair growth, and conditions: Extremity Color: [Left:Normal] Hair Growth on Extremity: [Left:No] Temperature of Extremity:  [Left:Warm] Capillary Refill: [Left:< 3 seconds] Dependent Rubor: [Left:No No] Electronic Signature(s) Signed: 01/16/2023 5:41:53 PM By: Thayer Dallas Entered By: Thayer Dallas on 01/15/2023 10:54:00 -------------------------------------------------------------------------------- Multi Wound Chart Details Patient Name: Date of Service: [name], [name] 01/15/2023 1:30 PM Medical Record Number: [id number] Patient Account Number: [id number] Date of Birth/Sex: Treating RN: [birth year] (57 y.o. 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

## 2023-01-21 ENCOUNTER — Other Ambulatory Visit: Payer: Self-pay

## 2023-01-21 ENCOUNTER — Encounter (HOSPITAL_COMMUNITY): Payer: Self-pay

## 2023-01-21 ENCOUNTER — Inpatient Hospital Stay (HOSPITAL_COMMUNITY)
Admission: EM | Admit: 2023-01-21 | Discharge: 2023-01-24 | DRG: 291 | Disposition: A | Discharge status: Home Health | Payer: Medicare Other | Attending: Internal Medicine | Admitting: Internal Medicine

## 2023-01-21 ENCOUNTER — Emergency Department (HOSPITAL_COMMUNITY): Payer: Medicare Other

## 2023-01-21 DIAGNOSIS — G4700 Insomnia, unspecified: Secondary | ICD-10-CM | POA: Diagnosis present

## 2023-01-21 DIAGNOSIS — J9601 Acute respiratory failure with hypoxia: Secondary | ICD-10-CM | POA: Diagnosis present

## 2023-01-21 DIAGNOSIS — J4540 Moderate persistent asthma, uncomplicated: Secondary | ICD-10-CM | POA: Diagnosis present

## 2023-01-21 DIAGNOSIS — I5033 Acute on chronic diastolic (congestive) heart failure: Secondary | ICD-10-CM | POA: Diagnosis present

## 2023-01-21 DIAGNOSIS — D631 Anemia in chronic kidney disease: Secondary | ICD-10-CM | POA: Diagnosis present

## 2023-01-21 DIAGNOSIS — N186 End stage renal disease: Secondary | ICD-10-CM | POA: Diagnosis present

## 2023-01-21 DIAGNOSIS — M898X9 Other specified disorders of bone, unspecified site: Secondary | ICD-10-CM | POA: Diagnosis present

## 2023-01-21 DIAGNOSIS — Z794 Long term (current) use of insulin: Secondary | ICD-10-CM

## 2023-01-21 DIAGNOSIS — F410 Panic disorder [episodic paroxysmal anxiety] without agoraphobia: Secondary | ICD-10-CM | POA: Diagnosis present

## 2023-01-21 DIAGNOSIS — E1165 Type 2 diabetes mellitus with hyperglycemia: Secondary | ICD-10-CM | POA: Diagnosis present

## 2023-01-21 DIAGNOSIS — N2581 Secondary hyperparathyroidism of renal origin: Secondary | ICD-10-CM | POA: Diagnosis present

## 2023-01-21 DIAGNOSIS — G4733 Obstructive sleep apnea (adult) (pediatric): Secondary | ICD-10-CM | POA: Diagnosis present

## 2023-01-21 DIAGNOSIS — Z8249 Family history of ischemic heart disease and other diseases of the circulatory system: Secondary | ICD-10-CM

## 2023-01-21 DIAGNOSIS — E669 Obesity, unspecified: Secondary | ICD-10-CM | POA: Diagnosis present

## 2023-01-21 DIAGNOSIS — G8929 Other chronic pain: Secondary | ICD-10-CM | POA: Diagnosis present

## 2023-01-21 DIAGNOSIS — M545 Low back pain, unspecified: Secondary | ICD-10-CM | POA: Diagnosis present

## 2023-01-21 DIAGNOSIS — J810 Acute pulmonary edema: Principal | ICD-10-CM | POA: Diagnosis present

## 2023-01-21 DIAGNOSIS — R0602 Shortness of breath: Secondary | ICD-10-CM | POA: Diagnosis not present

## 2023-01-21 DIAGNOSIS — E119 Type 2 diabetes mellitus without complications: Secondary | ICD-10-CM

## 2023-01-21 DIAGNOSIS — E1122 Type 2 diabetes mellitus with diabetic chronic kidney disease: Secondary | ICD-10-CM | POA: Diagnosis present

## 2023-01-21 DIAGNOSIS — I132 Hypertensive heart and chronic kidney disease with heart failure and with stage 5 chronic kidney disease, or end stage renal disease: Principal | ICD-10-CM | POA: Diagnosis present

## 2023-01-21 DIAGNOSIS — F32A Depression, unspecified: Secondary | ICD-10-CM | POA: Diagnosis present

## 2023-01-21 DIAGNOSIS — Z885 Allergy status to narcotic agent status: Secondary | ICD-10-CM

## 2023-01-21 DIAGNOSIS — Z79899 Other long term (current) drug therapy: Secondary | ICD-10-CM

## 2023-01-21 DIAGNOSIS — Z1152 Encounter for screening for COVID-19: Secondary | ICD-10-CM

## 2023-01-21 LAB — URINALYSIS, ROUTINE W REFLEX MICROSCOPIC
Bilirubin Urine: NEGATIVE
Glucose, UA: 150 mg/dL — AB
Ketones, ur: NEGATIVE mg/dL
Leukocytes,Ua: NEGATIVE
Nitrite: NEGATIVE
Protein, ur: 100 mg/dL — AB
Specific Gravity, Urine: 1.01 (ref 1.005–1.030)
pH: 7 (ref 5.0–8.0)

## 2023-01-21 LAB — RESP PANEL BY RT-PCR (RSV, FLU A&B, COVID)  RVPGX2
Influenza A by PCR: NEGATIVE
Influenza B by PCR: NEGATIVE
Resp Syncytial Virus by PCR: NEGATIVE
SARS Coronavirus 2 by RT PCR: NEGATIVE

## 2023-01-21 LAB — CBC
HCT: 31.2 % — ABNORMAL LOW (ref 39.0–52.0)
Hemoglobin: 9.8 g/dL — ABNORMAL LOW (ref 13.0–17.0)
MCH: 30.5 pg (ref 26.0–34.0)
MCHC: 31.4 g/dL (ref 30.0–36.0)
MCV: 97.2 fL (ref 80.0–100.0)
Platelets: 364 10*3/uL (ref 150–400)
RBC: 3.21 MIL/uL — ABNORMAL LOW (ref 4.22–5.81)
RDW: 13.7 % (ref 11.5–15.5)
WBC: 9.8 10*3/uL (ref 4.0–10.5)
nRBC: 0 % (ref 0.0–0.2)

## 2023-01-21 LAB — COMPREHENSIVE METABOLIC PANEL
ALT: 7 U/L (ref 0–44)
AST: 10 U/L — ABNORMAL LOW (ref 15–41)
Albumin: 3.1 g/dL — ABNORMAL LOW (ref 3.5–5.0)
Alkaline Phosphatase: 60 U/L (ref 38–126)
Anion gap: 15 (ref 5–15)
BUN: 52 mg/dL — ABNORMAL HIGH (ref 6–20)
CO2: 25 mmol/L (ref 22–32)
Calcium: 9.7 mg/dL (ref 8.9–10.3)
Chloride: 94 mmol/L — ABNORMAL LOW (ref 98–111)
Creatinine, Ser: 12.22 mg/dL — ABNORMAL HIGH (ref 0.61–1.24)
GFR, Estimated: 4 mL/min — ABNORMAL LOW (ref >60)
Glucose, Bld: 172 mg/dL — ABNORMAL HIGH (ref 70–99)
Potassium: 4.4 mmol/L (ref 3.5–5.1)
Sodium: 134 mmol/L — ABNORMAL LOW (ref 135–145)
Total Bilirubin: 0.6 mg/dL (ref 0.3–1.2)
Total Protein: 6.9 g/dL (ref 6.5–8.1)

## 2023-01-21 LAB — HEPATITIS PANEL, ACUTE
HCV Ab: NONREACTIVE
Hep A IgM: NONREACTIVE
Hep B C IgM: NONREACTIVE
Hepatitis B Surface Ag: NONREACTIVE

## 2023-01-21 LAB — LIPASE, BLOOD: Lipase: 27 U/L (ref 11–51)

## 2023-01-21 LAB — BRAIN NATRIURETIC PEPTIDE: B Natriuretic Peptide: 991.4 pg/mL — ABNORMAL HIGH (ref 0.0–100.0)

## 2023-01-21 LAB — HEPATITIS B SURFACE ANTIGEN: Hepatitis B Surface Ag: NONREACTIVE

## 2023-01-21 MED ORDER — ACETAMINOPHEN 325 MG PO TABS
650.0000 mg | ORAL_TABLET | Freq: Four times a day (QID) | ORAL | Status: DC | PRN
  Administered 2023-01-21: 650 mg via ORAL
  Filled 2023-01-21: qty 2

## 2023-01-21 MED ORDER — LIDOCAINE 5 % EX PTCH
2.0000 | MEDICATED_PATCH | CUTANEOUS | Status: DC
  Administered 2023-01-21 – 2023-01-23 (×3): 2 via TRANSDERMAL
  Filled 2023-01-21 (×3): qty 2

## 2023-01-21 MED ORDER — GABAPENTIN 300 MG PO CAPS
300.0000 mg | ORAL_CAPSULE | Freq: Every day | ORAL | Status: DC
  Administered 2023-01-22 – 2023-01-24 (×3): 300 mg via ORAL
  Filled 2023-01-21 (×3): qty 1

## 2023-01-21 MED ORDER — INSULIN ASPART 100 UNIT/ML IJ SOLN
0.0000 [IU] | Freq: Three times a day (TID) | INTRAMUSCULAR | Status: DC
  Administered 2023-01-22: 1 [IU] via SUBCUTANEOUS
  Administered 2023-01-23: 2 [IU] via SUBCUTANEOUS

## 2023-01-21 MED ORDER — AMLODIPINE BESYLATE 10 MG PO TABS
10.0000 mg | ORAL_TABLET | Freq: Every day | ORAL | Status: DC
  Administered 2023-01-22 – 2023-01-24 (×4): 10 mg via ORAL
  Filled 2023-01-21 (×3): qty 2
  Filled 2023-01-21 (×2): qty 1

## 2023-01-21 MED ORDER — HEPARIN SODIUM (PORCINE) 5000 UNIT/ML IJ SOLN
5000.0000 [IU] | Freq: Three times a day (TID) | INTRAMUSCULAR | Status: DC
  Administered 2023-01-22 – 2023-01-24 (×7): 5000 [IU] via SUBCUTANEOUS
  Filled 2023-01-21 (×7): qty 1

## 2023-01-21 MED ORDER — NITROGLYCERIN 0.4 MG SL SUBL: 0.4000 mg | SUBLINGUAL_TABLET | SUBLINGUAL | Status: DC | PRN

## 2023-01-21 MED ORDER — HYDROCODONE-ACETAMINOPHEN 5-325 MG PO TABS
1.0000 | ORAL_TABLET | Freq: Three times a day (TID) | ORAL | Status: DC | PRN
  Filled 2023-01-21: qty 1

## 2023-01-21 MED ORDER — CARVEDILOL 25 MG PO TABS
25.0000 mg | ORAL_TABLET | Freq: Two times a day (BID) | ORAL | Status: DC
  Administered 2023-01-22 – 2023-01-24 (×4): 25 mg via ORAL
  Filled 2023-01-21: qty 1
  Filled 2023-01-21: qty 2
  Filled 2023-01-21 (×2): qty 1

## 2023-01-21 MED ORDER — SENNOSIDES-DOCUSATE SODIUM 8.6-50 MG PO TABS
1.0000 | ORAL_TABLET | Freq: Every evening | ORAL | Status: DC | PRN
  Administered 2023-01-23: 1 via ORAL
  Filled 2023-01-21: qty 1

## 2023-01-21 MED ORDER — ACETAMINOPHEN 650 MG RE SUPP: 650.0000 mg | Freq: Four times a day (QID) | RECTAL | Status: DC | PRN

## 2023-01-21 NOTE — ED Provider Notes (Signed)
[place] EMERGENCY DEPARTMENT AT [facility] Provider Note   CSN: [id number] Arrival date & time: 01/21/23  1022     History  Chief Complaint  Patient presents with   Shortness of Breath    Randeep Sweda is a 57 y.o. male.  57 year old male here today with lower back pain, shortness of breath, insomnia over the last 4 days.  He has end-stage renal disease, Monday Wednesday Friday dialysis.  He last went to dialysis on Friday.  Patient is here today because he has felt fatigued, has had a cough, has had worsening pain in his back.  Patient recently hospitalized for Pseudomonas bacteremia.   Shortness of Breath      Home Medications Prior to Admission medications   Medication Sig Start Date End Date Taking? Authorizing Provider  Accu-Chek Softclix Lancets lancets Use as directed.  For insulin-dependent type 2 diabetes Monitor  glucose 3 times a day Patient taking differently: 1 each by Other route See admin instructions. Use as directed.  For insulin-dependent type 2 diabetes Monitor  glucose 3 times a day 01/06/20   Albertine Grates, MD  acetaminophen (TYLENOL) 325 MG tablet Take 2 tablets (650 mg total) by mouth every 6 (six) hours. 12/03/22   Lockie Mola, MD  albuterol (PROVENTIL) (2.5 MG/3ML) 0.083% nebulizer solution Take 3 mLs (2.5 mg total) by nebulization every 6 (six) hours as needed for wheezing or shortness of breath. 09/25/22   Alfonse Spruce, MD  albuterol (VENTOLIN HFA) 108 (90 Base) MCG/ACT inhaler Inhale 2 puffs into the lungs every 6 (six) hours as needed for wheezing or shortness of breath. 09/25/22   Alfonse Spruce, MD  amLODipine (NORVASC) 10 MG tablet Take 1 tablet (10 mg total) by mouth daily. 05/24/22   Rocky Morel, DO  calcitRIOL (ROCALTROL) 0.25 MCG capsule Take 1 capsule (0.25 mcg total) by mouth every Monday, Wednesday, and Friday. 01/08/20   Albertine Grates, MD  camphor-menthol [facility]) lotion Apply topically 2 (two) times daily. 12/03/22    Lockie Mola, MD  carvedilol (COREG) 25 MG tablet Take 1 tablet (25 mg total) by mouth 2 (two) times daily with a meal. 05/24/22   Rocky Morel, DO  gabapentin (NEURONTIN) 600 MG tablet Take 600 mg by mouth daily.    [provider]  glucose blood test strip Use as instructed For insulin-dependent type 2 diabetes Monitor  glucose 3 times a day Patient taking differently: 1 each by Other route See admin instructions. Use as instructed For insulin-dependent type 2 diabetes Monitor  glucose 3 times a day 01/06/20   Albertine Grates, MD  levocetirizine (XYZAL) 5 MG tablet Take 1 tablet (5 mg total) by mouth every evening. 09/25/22   Alfonse Spruce, MD  lidocaine (XYLOCAINE) 2 % jelly Apply 1 Application topically daily. 11/26/22   Alvira Monday, MD  multivitamin (RENA-VIT) TABS tablet Take 1 tablet by mouth at bedtime. 05/24/22   Rocky Morel, DO  Nutritional Supplements (,FEEDING SUPPLEMENT, PROSOURCE PLUS) liquid Take 30 mLs by mouth 2 (two) times daily between meals. 12/03/22   Lockie Mola, MD  Nutritional Supplements (FEEDING SUPPLEMENT, NEPRO CARB STEADY,) LIQD Take 237 mLs by mouth 2 (two) times daily between meals. 12/03/22   Lockie Mola, MD  sevelamer carbonate (RENVELA) 800 MG tablet Take 3 tablets (2,400 mg total) by mouth 3 (three) times daily with meals. 12/03/22   Lockie Mola, MD  Vitamin D, Ergocalciferol, (DRISDOL) 1.25 MG (50000 UNIT) CAPS capsule Take 50,000 Units by mouth See admin  instructions. Take one tablet by mouth on Monday, Wednesdays and Fridays per patient 08/17/19   [provider]  insulin aspart protamine - aspart (NOVOLOG MIX 70/30 FLEXPEN) (70-30) 100 UNIT/ML FlexPen Inject 15-30 Units into the skin 2 (two) times daily.  05/24/22  [provider]      Allergies    Oxycodone    Review of Systems   Review of Systems  Respiratory:  Positive for shortness of breath.     Physical Exam Updated Vital Signs BP (!) 205/120   Pulse  73   Temp 98 F (36.7 C)   Resp 20   Ht 5\' 7"  (1.702 m)   Wt 101.2 kg   SpO2 95%   BMI 34.93 kg/m  Physical Exam Vitals reviewed.  HENT:     Head: Normocephalic.  Cardiovascular:     Rate and Rhythm: Normal rate.  Pulmonary:     Effort: Pulmonary effort is normal.     Comments: Cough present Chest:     Chest wall: No mass.  Abdominal:     Palpations: Abdomen is soft.  Musculoskeletal:        General: Normal range of motion.     Cervical back: Normal range of motion.  Skin:    General: Skin is warm and dry.  Neurological:     General: No focal deficit present.     Mental Status: He is alert.     Comments: Lower extremity weakness or numbness.  No saddle anesthesia     ED Results / Procedures / Treatments   Labs (all labs ordered are listed, but only abnormal results are displayed) Labs Reviewed  COMPREHENSIVE METABOLIC PANEL - Abnormal; Notable for the following components:      Result Value   Sodium 134 (*)    Chloride 94 (*)    Glucose, Bld 172 (*)    BUN 52 (*)    Creatinine, Ser 12.22 (*)    Albumin 3.1 (*)    AST 10 (*)    GFR, Estimated 4 (*)    All other components within normal limits  CBC - Abnormal; Notable for the following components:   RBC 3.21 (*)    Hemoglobin 9.8 (*)    HCT 31.2 (*)    All other components within normal limits  URINALYSIS, ROUTINE W REFLEX MICROSCOPIC - Abnormal; Notable for the following components:   Glucose, UA 150 (*)    Hgb urine dipstick SMALL (*)    Protein, ur 100 (*)    Bacteria, UA RARE (*)    All other components within normal limits  RESP PANEL BY RT-PCR (RSV, FLU A&B, COVID)  RVPGX2  LIPASE, BLOOD    EKG None  Radiology DG Chest 2 View  Result Date: 01/21/2023 CLINICAL DATA:  Shortness of breath EXAM: CHEST - 2 VIEW COMPARISON:  X-ray 11/30/2022 FINDINGS: Stable cardiopericardial silhouette with a calcified and tortuous aorta. No pneumothorax. Blunted right costophrenic angle. Tiny effusion versus  pleural thickening. There are some subtle opacity in the right lung base, possibly in the middle lobe with some thickening of the adjacent fissures. Slight central vascular congestion. Left lung is without consolidation. Pneumothorax. IMPRESSION: Slight blunting of the right costophrenic angle. Tiny effusion versus pleural thickening. Subtle opacity towards the middle lobe. Infiltrates not excluded. Recommend follow-up. Electronically Signed   By: Karen Kays M.D.   On: 01/21/2023 12:27    Procedures .Critical Care  Performed by: Arletha Pili, DO Authorized by: Arletha Pili, DO  Critical care provider statement:    Critical care time (minutes):  30   Critical care was time spent personally by me on the following activities:  Development of treatment plan with patient or surrogate, discussions with consultants, evaluation of patient's response to treatment, examination of patient, ordering and review of laboratory studies, ordering and review of radiographic studies, ordering and performing treatments and interventions, pulse oximetry, re-evaluation of patient's condition and review of old charts     Medications Ordered in ED Medications  nitroGLYCERIN (NITROSTAT) SL tablet 0.4 mg (has no administration in time range)    ED Course/ Medical Decision Making/ A&P                                 Medical Decision Making 57 year old male here today with back pain, cough.  Differential diagnoses include spinal epidural abscess, viral syndrome, pneumonia, fluid overload.  Plan-patient does not appear to be acutely volume overloaded at this time.  My independent review of the patient's chest x-ray does show possible pleural effusion.  Will order a CT chest.  With the patient's back pain, or known bacteremia, I do of concern for the possibility of a spinal epidural abscess.  Will obtain MRI imaging of the patient's spine.  Following imaging, will reach out to dialysis to see if we can  arrange for dialysis for this patient.  Reassessment-3:20 PM.  Patient began to complain of shortness of breath.  I performed bedside ultrasound the patient was show diffuse B-lines.  Will order BiPAP for the patient.  Have reached out to nephrology for emergent dialysis.  Nitroglycerin ordered. Spoke with Dr. Glenna Fellows who is agreeable to emergent dialysis.  Amount and/or Complexity of Data Reviewed Labs: ordered. Radiology: ordered.           Final Clinical Impression(s) / ED Diagnoses Final diagnoses:  Acute pulmonary edema (HCC)  Acute low back pain, unspecified back pain laterality, unspecified whether sciatica present    Rx / DC Orders ED Discharge Orders     None         Anders Simmonds T, DO 01/21/23 1524

## 2023-01-21 NOTE — Progress Notes (Signed)
Patient stated he was breathing easier and wanted to remove bipap. Placed patient on 3lpm with Sp02=99%. Nurse given number to call if patient becomes short of breath.

## 2023-01-21 NOTE — Consult Note (Signed)
[facility] Renal Consultation Note    Indication for Consultation:  Management of ESRD/hemodialysis, anemia, hypertension/volume, and secondary hyperparathyroidism. PCP:  HPI: [name] is a 57 y.o. male with ESRD, HTN, HL, T2DM, OSA and chronic L leg wound being admitted with AHRF, requiring BiPAP.  Presented to ED with mild dyspnea as well as LBP, ongoing for while but worsened. Afebrile. BP high. Plan was for MRI LS and then discharge, but then developed more acute dyspnea which required BiPAP.  Seen in ED room, BiPAP in place - feels better now. No CP or abdominal pain. Few episodes diarrhea last week, but he attributes to Xphozah (new phos med).  Dialyzes on MWF schedule at [facility]. Hasn't missed - may have overdone it with fluid this weekend. Uses LUE AVF - no recent issues.   Past Medical History:  Diagnosis Date   Acute kidney injury (HCC) 12/28/2014   Adjustment disorder with mixed anxiety and depressed mood 12/28/2014   Asthma    CHF (congestive heart failure) (HCC)    CPAP (continuous positive airway pressure) dependence    Diabetes mellitus without complication (HCC)    DM (diabetes mellitus) type II controlled with renal manifestation (HCC) 12/28/2014   DM (diabetes mellitus) type II controlled, neurological manifestation (HCC) 12/28/2014   Encephalopathy, hypertensive 12/28/2014   GERD (gastroesophageal reflux disease)    Hypertension    Hypertensive emergency without congestive heart failure 12/27/2014   Pneumonia    Possible Panic disorder 12/28/2014   Renal disorder    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   Sleep apnea    Past Surgical History:  Procedure Laterality Date   AV FISTULA PLACEMENT Left 01/05/2020   Procedure: LEFT ARM BRACHIOCEPHALIC ARTERIOVENOUS (AV) FISTULA;  Surgeon: Chuck Hint, MD;  Location: [facility] OR;  Service: Vascular;  Laterality: Left;   EMPYEMA DRAINAGE Right 05/17/2017   Procedure: EMPYEMA DRAINAGE AND DECORTICATION;   Surgeon: Delight Ovens, MD;  Location: [facility] OR;  Service: Thoracic;  Laterality: Right;   FLEXIBLE BRONCHOSCOPY  05/17/2017   Procedure: FLEXIBLE BRONCHOSCOPY;  Surgeon: Delight Ovens, MD;  Location: [facility] OR;  Service: Thoracic;;   INSERTION OF DIALYSIS CATHETER Right 12/12/2021   Procedure: INSERTION OF RIGHT INTERNAL JUGULAR DIALYSIS CATHETER;  Surgeon: Chuck Hint, MD;  Location: [facility] OR;  Service: Vascular;  Laterality: Right;   IR FLUORO GUIDE CV LINE RIGHT  01/01/2020   IR THORACENTESIS ASP PLEURAL SPACE W/IMG GUIDE  05/17/2017   IR US GUIDE VASC ACCESS RIGHT  01/01/2020   NO PAST SURGERIES     REVISON OF ARTERIOVENOUS FISTULA Left 12/12/2021   Procedure: REVISION OF LEFT ARM FISTULA BY PLICATION;  Surgeon: Chuck Hint, MD;  Location: [facility] OR;  Service: Vascular;  Laterality: Left;   THORACOTOMY/LOBECTOMY Right 05/17/2017   Procedure: RIGHT MINI THORACOTOMY;  Surgeon: Delight Ovens, MD;  Location: [facility] OR;  Service: Thoracic;  Laterality: Right;   VIDEO ASSISTED THORACOSCOPY (VATS)/EMPYEMA Right 05/17/2017   Procedure: RIGHT VIDEO ASSISTED THORACOSCOPY (VATS)/EMPYEMA;  Surgeon: Delight Ovens, MD;  Location: [facility] OR;  Service: Thoracic;  Laterality: Right;   Family History  Problem Relation Age of Onset   Hypertension Mother    Social History:  reports that he has never smoked. He has never been exposed to tobacco smoke. He has never used smokeless tobacco. He reports that he does not drink alcohol and does not use drugs.  ROS: As per HPI otherwise negative.  Physical Exam: Vitals:   01/21/23 1450 01/21/23 1500 01/21/23  1530 01/21/23 1545  BP:  (!) 208/121 (!) 202/102 (!) 194/121  Pulse:  73 74 71  Resp:      Temp: 98 F (36.7 C)     SpO2:  98% 95% 100%  Weight:      Height:         General: Well developed, well nourished. Mild work of breathing, on BiPAP Head: Normocephalic, atraumatic, sclera non-icteric, mucus membranes are moist. Neck: Supple without  lymphadenopathy/masses. JVD not elevated. Lungs: Labored breathing, on BiPAP - CTAB, no rales Heart: RRR with normal S1, S2. No murmurs, rubs, or gallops appreciated. Abdomen: Soft, non-tender, non-distended with normoactive bowel sounds.  Musculoskeletal:  Strength and tone appear normal for age. Lower extremities: No edema, chronic L shin wound (opened edge of bandage, looks dry without odor) Neuro: Alert and oriented X 3. Moves all extremities spontaneously. Psych:  Responds to questions appropriately with a normal affect. Dialysis Access: LUE AVF + bruit  Allergies  Allergen Reactions   Oxycodone Itching   Prior to Admission medications   Medication Sig Start Date End Date Taking? Authorizing Provider  acetaminophen (TYLENOL) 325 MG tablet Take 2 tablets (650 mg total) by mouth every 6 (six) hours. 12/03/22  Yes Lockie Mola, MD  albuterol (PROVENTIL) (2.5 MG/3ML) 0.083% nebulizer solution Take 3 mLs (2.5 mg total) by nebulization every 6 (six) hours as needed for wheezing or shortness of breath. 09/25/22  Yes Alfonse Spruce, MD  albuterol (VENTOLIN HFA) 108 (90 Base) MCG/ACT inhaler Inhale 2 puffs into the lungs every 6 (six) hours as needed for wheezing or shortness of breath. 09/25/22  Yes Alfonse Spruce, MD  amLODipine (NORVASC) 10 MG tablet Take 1 tablet (10 mg total) by mouth daily. 05/24/22  Yes Rocky Morel, DO  calcitRIOL (ROCALTROL) 0.25 MCG capsule Take 1 capsule (0.25 mcg total) by mouth every Monday, Wednesday, and Friday. 01/08/20  Yes Albertine Grates, MD  camphor-menthol [facility]) lotion Apply topically 2 (two) times daily. Patient taking differently: Apply 1 Application topically 2 (two) times daily. 12/03/22  Yes Lockie Mola, MD  carvedilol (COREG) 25 MG tablet Take 1 tablet (25 mg total) by mouth 2 (two) times daily with a meal. 05/24/22  Yes Rocky Morel, DO  gabapentin (NEURONTIN) 600 MG tablet Take 600 mg by mouth daily.   Yes [provider]   levocetirizine (XYZAL) 5 MG tablet Take 1 tablet (5 mg total) by mouth every evening. 09/25/22  Yes Alfonse Spruce, MD  lidocaine (XYLOCAINE) 2 % jelly Apply 1 Application topically daily. 11/26/22  Yes Alvira Monday, MD  multivitamin (RENA-VIT) TABS tablet Take 1 tablet by mouth at bedtime. 05/24/22  Yes Rocky Morel, DO  Nutritional Supplements (,FEEDING SUPPLEMENT, PROSOURCE PLUS) liquid Take 30 mLs by mouth 2 (two) times daily between meals. 12/03/22  Yes Lockie Mola, MD  sevelamer carbonate (RENVELA) 800 MG tablet Take 3 tablets (2,400 mg total) by mouth 3 (three) times daily with meals. 12/03/22  Yes Lockie Mola, MD  tiZANidine (ZANAFLEX) 4 MG tablet Take 4 mg by mouth 2 (two) times daily. 12/11/22  Yes [provider]  traMADol (ULTRAM) 50 MG tablet Take 50 mg by mouth 2 (two) times daily as needed. 12/27/22  Yes [provider]  Vitamin D, Ergocalciferol, (DRISDOL) 1.25 MG (50000 UNIT) CAPS capsule Take 50,000 Units by mouth See admin instructions. Take one tablet by mouth on Monday, Wednesdays and Fridays per patient 08/17/19  Yes [provider]  XPHOZAH 30 MG TABS Take 1  tablet by mouth 2 (two) times daily. 01/15/23  Yes [provider]  Accu-Chek Softclix Lancets lancets Use as directed.  For insulin-dependent type 2 diabetes Monitor  glucose 3 times a day Patient taking differently: 1 each by Other route See admin instructions. Use as directed.  For insulin-dependent type 2 diabetes Monitor  glucose 3 times a day 01/06/20   Albertine Grates, MD  glucose blood test strip Use as instructed For insulin-dependent type 2 diabetes Monitor  glucose 3 times a day Patient taking differently: 1 each by Other route See admin instructions. Use as instructed For insulin-dependent type 2 diabetes Monitor  glucose 3 times a day 01/06/20   Albertine Grates, MD  insulin aspart protamine - aspart (NOVOLOG MIX 70/30 FLEXPEN) (70-30) 100 UNIT/ML FlexPen Inject 15-30 Units  into the skin 2 (two) times daily.  05/24/22  [provider]   Current Facility-Administered Medications  Medication Dose Route Frequency Provider Last Rate Last Admin   nitroGLYCERIN (NITROSTAT) SL tablet 0.4 mg  0.4 mg Sublingual Q5 min PRN Arletha Pili, DO       Current Outpatient Medications  Medication Sig Dispense Refill   acetaminophen (TYLENOL) 325 MG tablet Take 2 tablets (650 mg total) by mouth every 6 (six) hours.     albuterol (PROVENTIL) (2.5 MG/3ML) 0.083% nebulizer solution Take 3 mLs (2.5 mg total) by nebulization every 6 (six) hours as needed for wheezing or shortness of breath. 75 mL 1   albuterol (VENTOLIN HFA) 108 (90 Base) MCG/ACT inhaler Inhale 2 puffs into the lungs every 6 (six) hours as needed for wheezing or shortness of breath. 18 g 1   amLODipine (NORVASC) 10 MG tablet Take 1 tablet (10 mg total) by mouth daily. 30 tablet 0   calcitRIOL (ROCALTROL) 0.25 MCG capsule Take 1 capsule (0.25 mcg total) by mouth every Monday, Wednesday, and Friday. 12 capsule 0   camphor-menthol (SARNA) lotion Apply topically 2 (two) times daily. (Patient taking differently: Apply 1 Application topically 2 (two) times daily.) 222 mL 0   carvedilol (COREG) 25 MG tablet Take 1 tablet (25 mg total) by mouth 2 (two) times daily with a meal. 60 tablet 0   gabapentin (NEURONTIN) 600 MG tablet Take 600 mg by mouth daily.     levocetirizine (XYZAL) 5 MG tablet Take 1 tablet (5 mg total) by mouth every evening. 30 tablet 5   lidocaine (XYLOCAINE) 2 % jelly Apply 1 Application topically daily. 50 mL 0   multivitamin (RENA-VIT) TABS tablet Take 1 tablet by mouth at bedtime. 30 tablet 0   Nutritional Supplements (,FEEDING SUPPLEMENT, PROSOURCE PLUS) liquid Take 30 mLs by mouth 2 (two) times daily between meals. 887 mL 0   sevelamer carbonate (RENVELA) 800 MG tablet Take 3 tablets (2,400 mg total) by mouth 3 (three) times daily with meals. 270 tablet 0   tiZANidine (ZANAFLEX) 4 MG tablet  Take 4 mg by mouth 2 (two) times daily.     traMADol (ULTRAM) 50 MG tablet Take 50 mg by mouth 2 (two) times daily as needed.     Vitamin D, Ergocalciferol, (DRISDOL) 1.25 MG (50000 UNIT) CAPS capsule Take 50,000 Units by mouth See admin instructions. Take one tablet by mouth on Monday, Wednesdays and Fridays per patient     XPHOZAH 30 MG TABS Take 1 tablet by mouth 2 (two) times daily.     Accu-Chek Softclix Lancets lancets Use as directed.  For insulin-dependent type 2 diabetes Monitor  glucose 3 times a day (  Patient taking differently: 1 each by Other route See admin instructions. Use as directed.  For insulin-dependent type 2 diabetes Monitor  glucose 3 times a day) 100 each 0   glucose blood test strip Use as instructed For insulin-dependent type 2 diabetes Monitor  glucose 3 times a day (Patient taking differently: 1 each by Other route See admin instructions. Use as instructed For insulin-dependent type 2 diabetes Monitor  glucose 3 times a day) 100 each 0   Labs: Basic Metabolic Panel: Recent Labs  Lab 01/21/23 1100  NA 134*  K 4.4  CL 94*  CO2 25  GLUCOSE 172*  BUN 52*  CREATININE 12.22*  CALCIUM 9.7   Liver Function Tests: Recent Labs  Lab 01/21/23 1100  AST 10*  ALT 7  ALKPHOS 60  BILITOT 0.6  PROT 6.9  ALBUMIN 3.1*   Recent Labs  Lab 01/21/23 1100  LIPASE 27   CBC: Recent Labs  Lab 01/21/23 1100  WBC 9.8  HGB 9.8*  HCT 31.2*  MCV 97.2  PLT 364   Studies/Results: DG Chest 2 View  Result Date: 01/21/2023 CLINICAL DATA:  Shortness of breath EXAM: CHEST - 2 VIEW COMPARISON:  X-ray 11/30/2022 FINDINGS: Stable cardiopericardial silhouette with a calcified and tortuous aorta. No pneumothorax. Blunted right costophrenic angle. Tiny effusion versus pleural thickening. There are some subtle opacity in the right lung base, possibly in the middle lobe with some thickening of the adjacent fissures. Slight central vascular congestion. Left lung is without  consolidation. Pneumothorax. IMPRESSION: Slight blunting of the right costophrenic angle. Tiny effusion versus pleural thickening. Subtle opacity towards the middle lobe. Infiltrates not excluded. Recommend follow-up. Electronically Signed   By: Karen Kays M.D.   On: 01/21/2023 12:27    Dialysis Orders:  MWF - [facility] 4:15hr, 400/A1.5, EDW 99.5kg, 2K/2Ca, AVF, heparin 5000 + 2000 bolus  Assessment/Plan:  Dyspnea/presumed overload: COVID neg. Mild initially, now requiring BiPAP - will dialyze tonight, anticipate will be able to come off HD after treatment.  ESRD:  Usual MWF - HD tonight.  Back pain: LS MRI pending, bilateral muscle tenderness on exam.  Hypertension/volume: BP sky high, no overt edema on exam or CXR. Resume home meds, follow BP after HD - should improve.  Anemia: Hgb 9.8 - follow.  Metabolic bone disease: Ca ok, Phos pending. Continue home meds.  L leg wound: Since 11/2022 at least - prev grew pseudomonas, looks dry on exam.  Ozzie Hoyle, PA-C 01/21/2023, 3:56 PM  [facility]

## 2023-01-21 NOTE — ED Notes (Signed)
PTAR scheduled for the patient. There is no ETA at the moment.

## 2023-01-21 NOTE — ED Triage Notes (Addendum)
Pt c/o back pain and shortness of breath that started 4 days ago. Pt also has leg pain. Pt states he feels like he is floating. Pt denies fevers, has had nausea/vomiting. Pt has had constipation.
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
## 2023-01-21 NOTE — Progress Notes (Signed)
RT and RN transported pt from ED03 to dialysis w/o complications. RT will monitor as needed.

## 2023-01-21 NOTE — Hospital Course (Addendum)
Date: 01/21/2023               Patient Name:  [name] MRN: [id number]  DOB: [birth year] Age / Sex: 57 y.o., male   PCP: Vladimir Crofts, FNP         Medical Service: Internal Medicine Teaching Service         Attending Physician: Dr. Arletha Pili, DO      First Contact: {InternPager24/[email or web address]:[id number]}    Second Contact: {ResidentPager24/25:[id number]}         After Hours (After 5p/  First Contact Pager: [phone]  weekends / holidays): Second Contact Pager: [phone]   SUBJECTIVE   Chief Complaint: Cough and back pain   History of Present Illness: ***  -SOB even at rest for *** days  -low back pain for *** days, was given hydrocodone (when***) which made him feel drowsy  -pain radiates down both legs at times  -able to walk without assistance  -no red flag symptoms of urinary retention, stool incontinence or saddle paresthesia  -still has chronic left leg pain 2/2 ongoing left LE wound -has not taken any pain medications for past few days -seeing wound care, seen in hospital in August for LLE wound ***, treated*** -last HD was Friday  -no O2 at home   -endorse nausea this past weekend -endorse headache   -denies fever or chills, chest pain    Recently admitted for pseudomonas bacteremia, concerned for potential epidural abscess. Developed pulmonary edema. Nephrology was consulted and will attempt to dialyze today.      ED Course: CMP Cr 12.22, Na 134, UA w/ small hgb, protein and rare bacteria. CXR with tiny effusion versus pleural thickening.   Past Medical History Past Medical History:  Diagnosis Date   Acute kidney injury (HCC) 12/28/2014   Adjustment disorder with mixed anxiety and depressed mood 12/28/2014   Asthma    CHF (congestive heart failure) (HCC)    CPAP (continuous positive airway pressure) dependence    Diabetes mellitus without complication (HCC)    DM (diabetes mellitus) type II controlled with renal manifestation (HCC) 12/28/2014   DM (diabetes  mellitus) type II controlled, neurological manifestation (HCC) 12/28/2014   Encephalopathy, hypertensive 12/28/2014   GERD (gastroesophageal reflux disease)    Hypertension    Hypertensive emergency without congestive heart failure 12/27/2014   Pneumonia    Possible Panic disorder 12/28/2014   Renal disorder    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   Proventil nebulizer  Ventolin HFA Amlodipine 10 mg  Carvedilol 25 mg tablet  Calcitriol MWF Gabapentin 600 mg daily  Renvela 2400 mg TID Tizanidine 4 mg BID Tramadol 50 mg PRN Vitamin D XPHOZAH BID***   Past Surgical History  Past Surgical History:  Procedure Laterality Date   AV FISTULA PLACEMENT Left 01/05/2020   Procedure: LEFT ARM BRACHIOCEPHALIC ARTERIOVENOUS (AV) FISTULA;  Surgeon: Chuck Hint, MD;  Location: [facility] OR;  Service: Vascular;  Laterality: Left;   EMPYEMA DRAINAGE Right 05/17/2017   Procedure: EMPYEMA DRAINAGE AND DECORTICATION;  Surgeon: Delight Ovens, MD;  Location: [facility] OR;  Service: Thoracic;  Laterality: Right;   FLEXIBLE BRONCHOSCOPY  05/17/2017   Procedure: FLEXIBLE BRONCHOSCOPY;  Surgeon: Delight Ovens, MD;  Location: [facility] OR;  Service: Thoracic;;   INSERTION OF DIALYSIS CATHETER Right 12/12/2021   Procedure: INSERTION OF RIGHT INTERNAL JUGULAR DIALYSIS CATHETER;  Surgeon:  Chuck Hint, MD;  Location: [facility] OR;  Service: Vascular;  Laterality: Right;   IR FLUORO GUIDE CV LINE RIGHT  01/01/2020   IR THORACENTESIS ASP PLEURAL SPACE W/IMG GUIDE  05/17/2017   IR US GUIDE VASC ACCESS RIGHT  01/01/2020   NO PAST SURGERIES     REVISON OF ARTERIOVENOUS FISTULA Left 12/12/2021   Procedure: REVISION OF LEFT ARM FISTULA BY PLICATION;  Surgeon: Chuck Hint, MD;  Location: [facility] OR;  Service: Vascular;  Laterality: Left;   THORACOTOMY/LOBECTOMY Right 05/17/2017   Procedure:  RIGHT MINI THORACOTOMY;  Surgeon: Delight Ovens, MD;  Location: [facility] OR;  Service: Thoracic;  Laterality: Right;   VIDEO ASSISTED THORACOSCOPY (VATS)/EMPYEMA Right 05/17/2017   Procedure: RIGHT VIDEO ASSISTED THORACOSCOPY (VATS)/EMPYEMA;  Surgeon: Delight Ovens, MD;  Location: [facility] OR;  Service: Thoracic;  Laterality: Right;    Social:  Lives With: wife Occupation: disability  Support: wife and children Level of Function: independent with ADL PCP: Vladimir Crofts, FNP Substances: Tobacco denies, Alcohol denies, Recreational Drug denies   Family History: *** Family History  Problem Relation Age of Onset   Hypertension Mother     Allergies: Allergies as of 01/21/2023 - Review Complete 01/21/2023  Allergen Reaction Noted   Oxycodone Itching 01/21/2023    Review of Systems: A complete ROS was negative except as per HPI.   OBJECTIVE:   Physical Exam: Blood pressure (!) 205/120, pulse 73, temperature 98 F (36.7 C), resp. rate 20, height 5\' 7"  (1.702 m), weight 101.2 kg, SpO2 95%.  Constitutional: well-appearing *** sitting in ***, in no acute distress HENT: normocephalic atraumatic, mucous membranes moist Eyes: conjunctiva non-erythematous Neck: supple Cardiovascular: regular rate and rhythm, no m/r/g Pulmonary/Chest: normal work of breathing on room air, lungs clear to auscultation bilaterally Abdominal: soft, non-tender, non-distended MSK: normal bulk and tone Neurological: alert & oriented x 3, 5/5 strength in bilateral upper and lower extremities, normal gait Skin: warm and dry Psych: ***  Labs: CBC    Component Value Date/Time   WBC 9.8 01/21/2023 1100   RBC 3.21 (L) 01/21/2023 1100   HGB 9.8 (L) 01/21/2023 1100   HGB 11.1 (L) 09/25/2022 1056   HCT 31.2 (L) 01/21/2023 1100   HCT 34.2 (L) 09/25/2022 1056   PLT 364 01/21/2023 1100   MCV 97.2 01/21/2023 1100   MCV 94 09/25/2022 1056   MCH 30.5 01/21/2023 1100   MCHC 31.4 01/21/2023 1100   RDW 13.7  01/21/2023 1100   RDW 13.2 09/25/2022 1056   LYMPHSABS 1.5 12/03/2022 0915   LYMPHSABS 1.8 09/25/2022 1056   MONOABS 0.5 12/03/2022 0915   EOSABS 0.3 12/03/2022 0915   EOSABS 0.2 09/25/2022 1056   BASOSABS 0.0 12/03/2022 0915   BASOSABS 0.0 09/25/2022 1056     CMP     Component Value Date/Time   NA 134 (L) 01/21/2023 1100   K 4.4 01/21/2023 1100   CL 94 (L) 01/21/2023 1100   CO2 25 01/21/2023 1100   GLUCOSE 172 (H) 01/21/2023 1100   BUN 52 (H) 01/21/2023 1100   CREATININE 12.22 (H) 01/21/2023 1100   CALCIUM 9.7 01/21/2023 1100   PROT 6.9 01/21/2023 1100   ALBUMIN 3.1 (L) 01/21/2023 1100   AST 10 (L) 01/21/2023 1100   ALT 7 01/21/2023 1100   ALKPHOS 60 01/21/2023 1100   BILITOT 0.6 01/21/2023 1100   GFRNONAA 4 (L) 01/21/2023 1100   GFRAA 13 (L) 01/07/2020 0130    Imaging:  EKG: personally reviewed my interpretation  is***. Prior EKG***  ASSESSMENT & PLAN:   Assessment & Plan by Problem: Active Problems:   * No active hospital problems. *   Maximilian Ruscitti is a 57 y.o. person living with a history of *** who presented with *** and admitted for *** on hospital day 0  *** ***  *** ***  *** ***  Diet: {NAMES:3044014::"Normal","Heart Healthy","Carb-Modified","Renal","Carb/Renal","NPO","TPN","Tube Feeds"} VTE: {NAMES:3044014::"Heparin","Enoxaparin","SCDs","DOAC","None"} IVF: {NAMES:3044014::"None","NS","1/2 NS","LR","D5","D10"},{NAMES:3044014::"None","10cc/hr","25cc/hr","50cc/hr","75cc/hr","100cc/hr","110cc/hr","125cc/hr","Bolus"} Code: {NAMES:3044014::"Full","DNR","DNI","DNR/DNI","Comfort Care","Unknown"}  Prior to Admission Living Arrangement: {NAMES:3044014::"Home, living ***","SNF, ***","Homeless","***"} Anticipated Discharge Location: {NAMES:3044014::"Home","SNF","CIR","***"} Barriers to Discharge: ***  Dispo: Admit patient to {STATUS:3044014::"Observation with expected length of stay less than 2 midnights.","Inpatient with expected length of stay greater  than 2 midnights."}  Signed: Peterson Ao, MD PGY-1 Psychiatry Resident Contact: secure message or page [phone] Please contact the on call pager after 5 pm and on weekends at [phone]. 01/21/2023, 3:28 PM

## 2023-01-22 ENCOUNTER — Ambulatory Visit (HOSPITAL_BASED_OUTPATIENT_CLINIC_OR_DEPARTMENT_OTHER): Payer: Medicare Other | Admitting: Internal Medicine

## 2023-01-22 ENCOUNTER — Observation Stay (HOSPITAL_COMMUNITY): Payer: Medicare Other

## 2023-01-22 ENCOUNTER — Inpatient Hospital Stay (HOSPITAL_COMMUNITY): Payer: Medicare Other

## 2023-01-22 DIAGNOSIS — N186 End stage renal disease: Secondary | ICD-10-CM | POA: Diagnosis present

## 2023-01-22 DIAGNOSIS — I132 Hypertensive heart and chronic kidney disease with heart failure and with stage 5 chronic kidney disease, or end stage renal disease: Secondary | ICD-10-CM | POA: Diagnosis present

## 2023-01-22 DIAGNOSIS — G4700 Insomnia, unspecified: Secondary | ICD-10-CM | POA: Diagnosis present

## 2023-01-22 DIAGNOSIS — Z794 Long term (current) use of insulin: Secondary | ICD-10-CM | POA: Diagnosis not present

## 2023-01-22 DIAGNOSIS — G8929 Other chronic pain: Secondary | ICD-10-CM | POA: Diagnosis present

## 2023-01-22 DIAGNOSIS — J9601 Acute respiratory failure with hypoxia: Secondary | ICD-10-CM | POA: Diagnosis present

## 2023-01-22 DIAGNOSIS — Z79899 Other long term (current) drug therapy: Secondary | ICD-10-CM | POA: Diagnosis not present

## 2023-01-22 DIAGNOSIS — I5033 Acute on chronic diastolic (congestive) heart failure: Secondary | ICD-10-CM | POA: Diagnosis present

## 2023-01-22 DIAGNOSIS — Z8249 Family history of ischemic heart disease and other diseases of the circulatory system: Secondary | ICD-10-CM | POA: Diagnosis not present

## 2023-01-22 DIAGNOSIS — E1165 Type 2 diabetes mellitus with hyperglycemia: Secondary | ICD-10-CM | POA: Diagnosis present

## 2023-01-22 DIAGNOSIS — E1122 Type 2 diabetes mellitus with diabetic chronic kidney disease: Secondary | ICD-10-CM | POA: Diagnosis present

## 2023-01-22 DIAGNOSIS — Z885 Allergy status to narcotic agent status: Secondary | ICD-10-CM | POA: Diagnosis not present

## 2023-01-22 DIAGNOSIS — J4540 Moderate persistent asthma, uncomplicated: Secondary | ICD-10-CM | POA: Diagnosis present

## 2023-01-22 DIAGNOSIS — M545 Low back pain, unspecified: Secondary | ICD-10-CM | POA: Diagnosis present

## 2023-01-22 DIAGNOSIS — D631 Anemia in chronic kidney disease: Secondary | ICD-10-CM | POA: Diagnosis present

## 2023-01-22 DIAGNOSIS — M898X9 Other specified disorders of bone, unspecified site: Secondary | ICD-10-CM | POA: Diagnosis present

## 2023-01-22 DIAGNOSIS — E669 Obesity, unspecified: Secondary | ICD-10-CM | POA: Diagnosis present

## 2023-01-22 DIAGNOSIS — F32A Depression, unspecified: Secondary | ICD-10-CM | POA: Diagnosis present

## 2023-01-22 DIAGNOSIS — F410 Panic disorder [episodic paroxysmal anxiety] without agoraphobia: Secondary | ICD-10-CM | POA: Diagnosis present

## 2023-01-22 DIAGNOSIS — R0602 Shortness of breath: Secondary | ICD-10-CM | POA: Diagnosis present

## 2023-01-22 DIAGNOSIS — N2581 Secondary hyperparathyroidism of renal origin: Secondary | ICD-10-CM | POA: Diagnosis present

## 2023-01-22 DIAGNOSIS — G4733 Obstructive sleep apnea (adult) (pediatric): Secondary | ICD-10-CM | POA: Diagnosis present

## 2023-01-22 DIAGNOSIS — Z1152 Encounter for screening for COVID-19: Secondary | ICD-10-CM | POA: Diagnosis not present

## 2023-01-22 LAB — CBC
HCT: 33.2 % — ABNORMAL LOW (ref 39.0–52.0)
Hemoglobin: 10.6 g/dL — ABNORMAL LOW (ref 13.0–17.0)
MCH: 30.4 pg (ref 26.0–34.0)
MCHC: 31.9 g/dL (ref 30.0–36.0)
MCV: 95.1 fL (ref 80.0–100.0)
Platelets: 382 10*3/uL (ref 150–400)
RBC: 3.49 MIL/uL — ABNORMAL LOW (ref 4.22–5.81)
RDW: 13.6 % (ref 11.5–15.5)
WBC: 9.7 10*3/uL (ref 4.0–10.5)
nRBC: 0 % (ref 0.0–0.2)

## 2023-01-22 LAB — RENAL FUNCTION PANEL
Albumin: 3.2 g/dL — ABNORMAL LOW (ref 3.5–5.0)
Anion gap: 13 (ref 5–15)
BUN: 25 mg/dL — ABNORMAL HIGH (ref 6–20)
CO2: 29 mmol/L (ref 22–32)
Calcium: 9.6 mg/dL (ref 8.9–10.3)
Chloride: 93 mmol/L — ABNORMAL LOW (ref 98–111)
Creatinine, Ser: 7.45 mg/dL — ABNORMAL HIGH (ref 0.61–1.24)
GFR, Estimated: 8 mL/min — ABNORMAL LOW (ref >60)
Glucose, Bld: 88 mg/dL (ref 70–99)
Phosphorus: 4.7 mg/dL — ABNORMAL HIGH (ref 2.5–4.6)
Potassium: 3.8 mmol/L (ref 3.5–5.1)
Sodium: 135 mmol/L (ref 135–145)

## 2023-01-22 LAB — GLUCOSE, CAPILLARY
Glucose-Capillary: 166 mg/dL — ABNORMAL HIGH (ref 70–99)
Glucose-Capillary: 152 mg/dL — ABNORMAL HIGH (ref 70–99)

## 2023-01-22 LAB — CBG MONITORING, ED
Glucose-Capillary: 111 mg/dL — ABNORMAL HIGH (ref 70–99)
Glucose-Capillary: 199 mg/dL — ABNORMAL HIGH (ref 70–99)

## 2023-01-22 LAB — C-REACTIVE PROTEIN: CRP: 10.8 mg/dL — ABNORMAL HIGH (ref <1.0)

## 2023-01-22 MED ORDER — TRAMADOL HCL 50 MG PO TABS: 50.0000 mg | ORAL_TABLET | Freq: Two times a day (BID) | ORAL | Status: DC | PRN

## 2023-01-22 MED ORDER — TRAMADOL HCL 50 MG PO TABS
50.0000 mg | ORAL_TABLET | Freq: Two times a day (BID) | ORAL | Status: DC | PRN
  Administered 2023-01-23: 50 mg via ORAL
  Filled 2023-01-22 (×2): qty 1

## 2023-01-22 MED ORDER — TRAMADOL HCL 50 MG PO TABS
50.0000 mg | ORAL_TABLET | Freq: Once | ORAL | Status: AC
  Administered 2023-01-22: 50 mg via ORAL
  Filled 2023-01-22: qty 1

## 2023-01-22 MED ORDER — HYDROMORPHONE HCL 2 MG PO TABS
1.0000 mg | ORAL_TABLET | ORAL | Status: DC | PRN
  Administered 2023-01-22 – 2023-01-24 (×4): 1 mg via ORAL
  Filled 2023-01-22 (×4): qty 1

## 2023-01-22 NOTE — ED Notes (Signed)
Patient states he no longer take Hydrocodone due to the mediaction causing odd behavior. MD made aware that patient is requesting a medication change.

## 2023-01-22 NOTE — Progress Notes (Signed)
Pt receives out-pt HD at [facility] [address] [place] on MWF. Will assist as needed.   Olivia Canter Renal Navigator [phone]

## 2023-01-22 NOTE — Plan of Care (Signed)

## 2023-01-22 NOTE — ED Notes (Signed)
ED TO INPATIENT HANDOFF REPORT  ED Nurse Name and Phone #: Marcelino Duster [phone]  S Name/Age/Gender [name] 57 y.o. male Room/Bed: 002C/002C  Code Status   Code Status: Full Code  Home/SNF/Other Home Patient oriented to: self, place, time, and situation Is this baseline? Yes   Triage Complete: Triage complete  Chief Complaint Acute pulmonary edema (HCC) [J81.0] Acute low back pain, unspecified back pain laterality, unspecified whether sciatica present [M54.50]  Triage Note Pt c/o back pain and shortness of breath that started 4 days ago. Pt also has leg pain. Pt states he feels like he is floating. Pt denies fevers, has had nausea/vomiting. Pt has had constipation.   Allergies Allergies  Allergen Reactions   Oxycodone Itching    Level of Care/Admitting Diagnosis ED Disposition     ED Disposition  Admit   Condition  --   Comment  Hospital Area: [facility] [[id number]]  Level of Care: Progressive [102]  Admit to Progressive based on following criteria: RESPIRATORY PROBLEMS hypoxemic/hypercapnic respiratory failure that is responsive to NIPPV (BiPAP) or High Flow Nasal Cannula (6-80 lpm). Frequent assessment/intervention, no > Q2 hrs < Q4 hrs, to maintain oxygenation and pulmonary hygiene.  May place patient in observation at [facility] or [facility] if equivalent level of care is available:: No  Covid Evaluation: Confirmed COVID Negative  Diagnosis: Acute pulmonary edema [facility]) [[id number]]  Admitting Physician: Reymundo Poll [[id number]]  Attending Physician: Reymundo Poll [[id number]]          B Medical/Surgery History Past Medical History:  Diagnosis Date   Acute kidney injury (HCC) 12/28/2014   Adjustment disorder with mixed anxiety and depressed mood 12/28/2014   Asthma    CHF (congestive heart failure) (HCC)    CPAP (continuous positive airway pressure) dependence    Diabetes mellitus without complication (HCC)    DM (diabetes mellitus)  type II controlled with renal manifestation (HCC) 12/28/2014   DM (diabetes mellitus) type II controlled, neurological manifestation (HCC) 12/28/2014   Encephalopathy, hypertensive 12/28/2014   GERD (gastroesophageal reflux disease)    Hypertension    Hypertensive emergency without congestive heart failure 12/27/2014   Pneumonia    Possible Panic disorder 12/28/2014   Renal disorder    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   Sleep apnea    Past Surgical History:  Procedure Laterality Date   AV FISTULA PLACEMENT Left 01/05/2020   Procedure: LEFT ARM BRACHIOCEPHALIC ARTERIOVENOUS (AV) FISTULA;  Surgeon: Chuck Hint, MD;  Location: [facility] OR;  Service: Vascular;  Laterality: Left;   EMPYEMA DRAINAGE Right 05/17/2017   Procedure: EMPYEMA DRAINAGE AND DECORTICATION;  Surgeon: Delight Ovens, MD;  Location: [facility] OR;  Service: Thoracic;  Laterality: Right;   FLEXIBLE BRONCHOSCOPY  05/17/2017   Procedure: FLEXIBLE BRONCHOSCOPY;  Surgeon: Delight Ovens, MD;  Location: [facility] OR;  Service: Thoracic;;   INSERTION OF DIALYSIS CATHETER Right 12/12/2021   Procedure: INSERTION OF RIGHT INTERNAL JUGULAR DIALYSIS CATHETER;  Surgeon: Chuck Hint, MD;  Location: [facility] OR;  Service: Vascular;  Laterality: Right;   IR FLUORO GUIDE CV LINE RIGHT  01/01/2020   IR THORACENTESIS ASP PLEURAL SPACE W/IMG GUIDE  05/17/2017   IR US GUIDE VASC ACCESS RIGHT  01/01/2020   NO PAST SURGERIES     REVISON OF ARTERIOVENOUS FISTULA Left 12/12/2021   Procedure: REVISION OF LEFT ARM FISTULA BY PLICATION;  Surgeon: Chuck Hint, MD;  Location[facility] OR;  Service: Vascular;  Laterality: Left;   THORACOTOMY/LOBECTOMY Right 05/17/2017   Procedure: RIGHT  MINI THORACOTOMY;  Surgeon: Delight Ovens, MD;  Location: [facility] OR;  Service: Thoracic;  Laterality: Right;   VIDEO ASSISTED THORACOSCOPY (VATS)/EMPYEMA Right 05/17/2017   Procedure: RIGHT VIDEO ASSISTED THORACOSCOPY (VATS)/EMPYEMA;  Surgeon: Delight Ovens, MD;   Location: [facility] OR;  Service: Thoracic;  Laterality: Right;     A IV Location/Drains/Wounds Patient Lines/Drains/Airways Status     Active Line/Drains/Airways     Name Placement date Placement time Site Days   Fistula / Graft Left Upper arm Arteriovenous fistula 12/12/21  0941  Upper arm  406   Y Chest Tube 1 and 2 05/17/17  1640  -- 2076   Wound / Incision (Open or Dehisced) 10/26/22 Other (Comment) Pretibial Distal;Left Anterior Lower leg/ Shin 10/26/22  0021  Pretibial  88            Intake/Output Last 24 hours No intake or output data in the 24 hours ending 01/22/23 1342  Labs/Imaging Results for orders placed or performed during the hospital encounter of 01/21/23 (from the past 48 hour(s))  Urinalysis, Routine w reflex microscopic -Urine, Clean Catch     Status: Abnormal   Collection Time: 01/21/23 10:22 AM  Result Value Ref Range   Color, Urine YELLOW YELLOW   APPearance CLEAR CLEAR   Specific Gravity, Urine 1.010 1.005 - 1.030   pH 7.0 5.0 - 8.0   Glucose, UA 150 (A) NEGATIVE mg/dL   Hgb urine dipstick SMALL (A) NEGATIVE   Bilirubin Urine NEGATIVE NEGATIVE   Ketones, ur NEGATIVE NEGATIVE mg/dL   Protein, ur 322 (A) NEGATIVE mg/dL   Nitrite NEGATIVE NEGATIVE   Leukocytes,Ua NEGATIVE NEGATIVE   RBC / HPF 0-5 0 - 5 RBC/hpf   WBC, UA 0-5 0 - 5 WBC/hpf   Bacteria, UA RARE (A) NONE SEEN   Squamous Epithelial / HPF 0-5 0 - 5 /HPF    Comment: Performed at [facility] Lab, [address], [place], [place] [address]  Lipase, blood     Status: None   Collection Time: 01/21/23 11:00 AM  Result Value Ref Range   Lipase 27 11 - 51 U/L    Comment: Performed at [facility], [address], [place], [place] [address]  Comprehensive metabolic panel     Status: Abnormal   Collection Time: 01/21/23 11:00 AM  Result Value Ref Range   Sodium 134 (L) 135 - 145 mmol/L   Potassium 4.4 3.5 - 5.1 mmol/L   Chloride 94 (L) 98 - 111 mmol/L   CO2 25 22 - 32 mmol/L   Glucose,  Bld 172 (H) 70 - 99 mg/dL    Comment: Glucose reference range applies only to samples taken after fasting for at least 8 hours.   BUN 52 (H) 6 - 20 mg/dL   Creatinine, Ser 76.28 (H) 0.61 - 1.24 mg/dL   Calcium 9.7 8.9 - 31.5 mg/dL   Total Protein 6.9 6.5 - 8.1 g/dL   Albumin 3.1 (L) 3.5 - 5.0 g/dL   AST 10 (L) 15 - 41 U/L   ALT 7 0 - 44 U/L   Alkaline Phosphatase 60 38 - 126 U/L   Total Bilirubin 0.6 0.3 - 1.2 mg/dL   GFR, Estimated 4 (L) >60 mL/min    Comment: (NOTE) Calculated using the CKD-EPI Creatinine Equation (2021)    Anion gap 15 5 - 15    Comment: Performed at [facility] Lab, [address], [place], [place] [address]  CBC  Status: Abnormal   Collection Time: 01/21/23 11:00 AM  Result Value Ref Range   WBC 9.8 4.0 - 10.5 K/uL   RBC 3.21 (L) 4.22 - 5.81 MIL/uL   Hemoglobin 9.8 (L) 13.0 - 17.0 g/dL   HCT 40.9 (L) 81.1 - 91.4 %   MCV 97.2 80.0 - 100.0 fL   MCH 30.5 26.0 - 34.0 pg   MCHC 31.4 30.0 - 36.0 g/dL   RDW 78.2 95.6 - 21.3 %   Platelets 364 150 - 400 K/uL   nRBC 0.0 0.0 - 0.2 %    Comment: Performed at [facility] Lab, [address] [place], [place] [address]  Brain natriuretic peptide     Status: Abnormal   Collection Time: 01/21/23 11:00 AM  Result Value Ref Range   B Natriuretic Peptide 991.4 (H) 0.0 - 100.0 pg/mL    Comment: Performed at [facility], [address], [place], [place] [address]  Resp panel by RT-PCR (RSV, Flu A&B, Covid) Anterior Nasal Swab     Status: None   Collection Time: 01/21/23 12:35 PM   Specimen: Anterior Nasal Swab  Result Value Ref Range   SARS Coronavirus 2 by RT PCR NEGATIVE NEGATIVE   Influenza A by PCR NEGATIVE NEGATIVE   Influenza B by PCR NEGATIVE NEGATIVE    Comment: (NOTE) The Xpert Xpress SARS-CoV-2/FLU/RSV plus assay is intended as an aid in the diagnosis of influenza from Nasopharyngeal swab specimens and should not be used as a sole basis for treatment. Nasal washings and aspirates are  unacceptable for Xpert Xpress SARS-CoV-2/FLU/RSV testing.  Fact Sheet for Patients: BloggerCourse.com  Fact Sheet for Healthcare Providers: SeriousBroker.it  This test is not yet approved or cleared by the [place] FDA and has been authorized for detection and/or diagnosis of SARS-CoV-2 by FDA under an Emergency Use Authorization (EUA). This EUA will remain in effect (meaning this test can be used) for the duration of the COVID-19 declaration under Section 564(b)(1) of the Act, 21 U.S.C. section 360bbb-3(b)(1), unless the authorization is terminated or revoked.     Resp Syncytial Virus by PCR NEGATIVE NEGATIVE    Comment: (NOTE) Fact Sheet for Patients: BloggerCourse.com  Fact Sheet for Healthcare Providers: SeriousBroker.it  This test is not yet approved or cleared by the [place] FDA and has been authorized for detection and/or diagnosis of SARS-CoV-2 by FDA under an Emergency Use Authorization (EUA). This EUA will remain in effect (meaning this test can be used) for the duration of the COVID-19 declaration under Section 564(b)(1) of the Act, 21 U.S.C. section 360bbb-3(b)(1), unless the authorization is terminated or revoked.  Performed at [facility] Lab, [address], [place], [place] [address]   Hepatitis B surface antigen     Status: None   Collection Time: 01/21/23 10:15 PM  Result Value Ref Range   Hepatitis B Surface Ag NON REACTIVE NON REACTIVE    Comment: Performed at [facility], [address] [place], [place] [address]  Hepatitis panel, acute     Status: None   Collection Time: 01/21/23 10:15 PM  Result Value Ref Range   Hepatitis B Surface Ag NON REACTIVE NON REACTIVE   HCV Ab NON REACTIVE NON REACTIVE    Comment: (NOTE) Nonreactive HCV antibody screen is consistent with no HCV infections,  unless recent infection is suspected or  other evidence exists to indicate HCV infection.     Hep A IgM NON REACTIVE NON REACTIVE   Hep  B C IgM NON REACTIVE NON REACTIVE    Comment: Performed at [facility], [address] [place], [place] [address]  CBC     Status: Abnormal   Collection Time: 01/22/23  4:12 AM  Result Value Ref Range   WBC 9.7 4.0 - 10.5 K/uL   RBC 3.49 (L) 4.22 - 5.81 MIL/uL   Hemoglobin 10.6 (L) 13.0 - 17.0 g/dL   HCT 66.0 (L) 63.0 - 16.0 %   MCV 95.1 80.0 - 100.0 fL   MCH 30.4 26.0 - 34.0 pg   MCHC 31.9 30.0 - 36.0 g/dL   RDW 10.9 32.3 - 55.7 %   Platelets 382 150 - 400 K/uL   nRBC 0.0 0.0 - 0.2 %    Comment: Performed at [facility], [address] [place], [place] [address]  Renal function panel     Status: Abnormal   Collection Time: 01/22/23  4:12 AM  Result Value Ref Range   Sodium 135 135 - 145 mmol/L   Potassium 3.8 3.5 - 5.1 mmol/L   Chloride 93 (L) 98 - 111 mmol/L   CO2 29 22 - 32 mmol/L   Glucose, Bld 88 70 - 99 mg/dL    Comment: Glucose reference range applies only to samples taken after fasting for at least 8 hours.   BUN 25 (H) 6 - 20 mg/dL   Creatinine, Ser 5.42 (H) 0.61 - 1.24 mg/dL   Calcium 9.6 8.9 - 70.6 mg/dL   Phosphorus 4.7 (H) 2.5 - 4.6 mg/dL   Albumin 3.2 (L) 3.5 - 5.0 g/dL   GFR, Estimated 8 (L) >60 mL/min    Comment: (NOTE) Calculated using the CKD-EPI Creatinine Equation (2021)    Anion gap 13 5 - 15    Comment: Performed at [facility], [address] [place], [place] [address]  CBG monitoring, ED     Status: Abnormal   Collection Time: 01/22/23  8:29 AM  Result Value Ref Range   Glucose-Capillary 111 (H) 70 - 99 mg/dL    Comment: Glucose reference range applies only to samples taken after fasting for at least 8 hours.  CBG monitoring, ED     Status: Abnormal   Collection Time: 01/22/23  1:18 PM  Result Value Ref Range   Glucose-Capillary 199 (H) 70 - 99 mg/dL    Comment: Glucose reference range applies only to samples taken after  fasting for at least 8 hours.   CT Chest Wo Contrast  Result Date: 01/22/2023 CLINICAL DATA:  Shortness of breath.  History of dialysis. EXAM: CT CHEST WITHOUT CONTRAST TECHNIQUE: Multidetector CT imaging of the chest was performed following the standard protocol without IV contrast. RADIATION DOSE REDUCTION: This exam was performed according to the departmental dose-optimization program which includes automated exposure control, adjustment of the mA and/or kV according to patient size and/or use of iterative reconstruction technique. COMPARISON:  CT chest dated May 22, 2022. FINDINGS: Cardiovascular: The heart size is enlarged. Multivessel coronary artery calcifications. Aortic atherosclerotic calcifications. No pericardial effusion. Mediastinum/Nodes: A right lower paratracheal node measures 14 mm in short axis, previously 15 mm. A prevascular node measures 9 mm in short axis, previously 14 mm. Additional prominent mediastinal lymph nodes appear similar to slightly decreased in size compared to the prior exam. No enlarged axillary lymph nodes. Thyroid gland, trachea, and esophagus demonstrate no significant findings. Lungs/Pleura: Bilateral diffuse interlobular septal thickening and ground-glass opacities. Linear parenchymal bands at the right lung base likely represent  atelectasis or scarring. No significant pleural effusion. No pneumothorax. Upper Abdomen: No acute abnormality. Musculoskeletal: No suspicious bone lesions identified. IMPRESSION: 1. Cardiomegaly with bilateral interlobular septal thickening and ground-glass densities are suggestive of pulmonary edema. 2. Enlarged right lower paratracheal node is likely reactive. Additional prominent mediastinal lymph nodes appear similar to slightly decreased in size compared to the prior exam. 3. Aortic atherosclerosis Aortic Atherosclerosis (ICD10-I70.0). Electronically Signed   By: Hart Robinsons M.D.   On: 01/22/2023 08:47   MR LUMBAR SPINE WO  CONTRAST  Result Date: 01/22/2023 CLINICAL DATA:  Lumbar radiculopathy with infection suspected EXAM: MRI LUMBAR SPINE WITHOUT CONTRAST TECHNIQUE: Multiplanar, multisequence MR imaging of the lumbar spine was performed. No intravenous contrast was administered. COMPARISON:  None Available. FINDINGS: Segmentation:  Standard. Alignment:  Physiologic. Vertebrae: No fracture, evidence of discitis, or bone lesion. Mild marrow edema and fatty marrow conversion at the corners of the L2-L5 vertebrae. No bridging osteophyte is seen. Limited coverage of the sacroiliac joints without detected inflammation or ankylosis. Conus medullaris and cauda equina: Conus extends to the L2 level. Conus and cauda equina appear normal. Paraspinal and other soft tissues: No perispinal mass or inflammation. Symmetric renal atrophy in the setting of end-stage renal disease. Disc levels: Diffusely preserved disc height and hydration with mild disc bulging at L2-3 to L4-5. Up to mild facet spurring at L4-5 and L3-4. No herniation or impingement. IMPRESSION: 1. Mild multilevel vertebral body corner edema which could be from spondylosis or spondyloarthropathy. 2. Limited degenerative change without impingement. Electronically Signed   By: Tiburcio Pea M.D.   On: 01/22/2023 08:22   DG Chest 2 View  Result Date: 01/21/2023 CLINICAL DATA:  Shortness of breath EXAM: CHEST - 2 VIEW COMPARISON:  X-ray 11/30/2022 FINDINGS: Stable cardiopericardial silhouette with a calcified and tortuous aorta. No pneumothorax. Blunted right costophrenic angle. Tiny effusion versus pleural thickening. There are some subtle opacity in the right lung base, possibly in the middle lobe with some thickening of the adjacent fissures. Slight central vascular congestion. Left lung is without consolidation. Pneumothorax. IMPRESSION: Slight blunting of the right costophrenic angle. Tiny effusion versus pleural thickening. Subtle opacity towards the middle lobe.  Infiltrates not excluded. Recommend follow-up. Electronically Signed   By: Karen Kays M.D.   On: 01/21/2023 12:27    Pending Labs Unresulted Labs (From admission, onward)     Start     Ordered   01/22/23 1145  C-reactive protein  Once,   R        01/22/23 1145   01/22/23 1145  Angiotensin converting enzyme  Once,   R        01/22/23 1145   01/21/23 1554  Hepatitis B surface antibody,quantitative  (New Admission Hemo Labs (Hepatitis B))  Once,   URGENT        01/21/23 1556            Vitals/Pain Today's Vitals   01/22/23 0358 01/22/23 0448 01/22/23 0645 01/22/23 0834  BP: (!) 183/125  (!) 148/92 (!) 165/108  Pulse:   70 70  Resp:   17 19  Temp:    97.9 F (36.6 C)  TempSrc:    Oral  SpO2:   99% 99%  Weight:      Height:      PainSc:  7       Isolation Precautions No active isolations  Medications Medications  nitroGLYCERIN (NITROSTAT) SL tablet 0.4 mg (has no administration in time range)  heparin injection 5,000 Units (5,000  Units Subcutaneous Given 01/22/23 0530)  acetaminophen (TYLENOL) tablet 650 mg (650 mg Oral Given 01/21/23 2244)    Or  acetaminophen (TYLENOL) suppository 650 mg ( Rectal See Alternative 01/21/23 2244)  senna-docusate (Senokot-S) tablet 1 tablet (has no administration in time range)  amLODipine (NORVASC) tablet 10 mg (10 mg Oral Given 01/22/23 1057)  carvedilol (COREG) tablet 25 mg (25 mg Oral Given 01/22/23 0844)  gabapentin (NEURONTIN) capsule 300 mg (300 mg Oral Given 01/22/23 1057)  lidocaine (LIDODERM) 5 % 2 patch (2 patches Transdermal Patch Removed 01/22/23 0846)  insulin aspart (novoLOG) injection 0-6 Units (0 Units Subcutaneous Not Given 01/22/23 0845)  HYDROmorphone (DILAUDID) tablet 1 mg (1 mg Oral Given 01/22/23 1057)  traMADol (ULTRAM) tablet 50 mg (has no administration in time range)  traMADol (ULTRAM) tablet 50 mg (50 mg Oral Given 01/22/23 0400)    Mobility walks     Focused Assessments Renal Assessment  Handoff:  Hemodialysis Schedule: Hemodialysis Schedule: Monday/Wednesday/Friday Last Hemodialysis date and time: 01/21/2023   Restricted appendage: left arm  , Pulmonary Assessment Handoff:  Lung sounds: Diminished           R Recommendations: See Admitting Provider Note  Report given to:   Additional Notes: Patient c/o lower back pain

## 2023-01-22 NOTE — ED Notes (Addendum)
Patient blood pressure medication were not given on previous shift due to patient being on BIPAP, Nurse made MD aware of patient Hypertension. Per  MD McLendon patient may have missed dose of amlodipine since patient is no longer on BIPAP.

## 2023-01-22 NOTE — Progress Notes (Signed)
Vascular at bedside to place IV.

## 2023-01-22 NOTE — Progress Notes (Signed)
[facility] Progress Note   Subjective:   Seen in room - still here. Dyspnea improved after HD last night, was able to come off BiPAP to nasal O2, but unable to wean from the O2. Back LS MRI without acute findings, and then underwent chest CT showing mediastinal LAD - primary team investigating possible sarcoidosis.  Objective Vitals:   01/22/23 0645 01/22/23 0834 01/22/23 1400 01/22/23 1402  BP: (!) 148/92 (!) 165/108 (!) 154/98   Pulse: 70 70 69   Resp: 17 19 18    Temp:  97.9 F (36.6 C)  98 F (36.7 C)  TempSrc:  Oral  Oral  SpO2: 99% 99% 100%   Weight:      Height:       Physical Exam General: Well appearing man, NAD. Nasal O2 Heart: RRR; no murmur Lungs: CTAB; no rales Abdomen: soft Extremities: No LE edema, L shin wound is dry Dialysis Access: Aneurysmal LUE AVF + bruit  Additional Objective Labs: Basic Metabolic Panel: Recent Labs  Lab 01/21/23 1100 01/22/23 0412  NA 134* 135  K 4.4 3.8  CL 94* 93*  CO2 25 29  GLUCOSE 172* 88  BUN 52* 25*  CREATININE 12.22* 7.45*  CALCIUM 9.7 9.6  PHOS  --  4.7*   Liver Function Tests: Recent Labs  Lab 01/21/23 1100 01/22/23 0412  AST 10*  --   ALT 7  --   ALKPHOS 60  --   BILITOT 0.6  --   PROT 6.9  --   ALBUMIN 3.1* 3.2*   Recent Labs  Lab 01/21/23 1100  LIPASE 27   CBC: Recent Labs  Lab 01/21/23 1100 01/22/23 0412  WBC 9.8 9.7  HGB 9.8* 10.6*  HCT 31.2* 33.2*  MCV 97.2 95.1  PLT 364 382   Studies/Results: CT Chest Wo Contrast  Result Date: 01/22/2023 CLINICAL DATA:  Shortness of breath.  History of dialysis. EXAM: CT CHEST WITHOUT CONTRAST TECHNIQUE: Multidetector CT imaging of the chest was performed following the standard protocol without IV contrast. RADIATION DOSE REDUCTION: This exam was performed according to the departmental dose-optimization program which includes automated exposure control, adjustment of the mA and/or kV according to patient size and/or use of iterative  reconstruction technique. COMPARISON:  CT chest dated May 22, 2022. FINDINGS: Cardiovascular: The heart size is enlarged. Multivessel coronary artery calcifications. Aortic atherosclerotic calcifications. No pericardial effusion. Mediastinum/Nodes: A right lower paratracheal node measures 14 mm in short axis, previously 15 mm. A prevascular node measures 9 mm in short axis, previously 14 mm. Additional prominent mediastinal lymph nodes appear similar to slightly decreased in size compared to the prior exam. No enlarged axillary lymph nodes. Thyroid gland, trachea, and esophagus demonstrate no significant findings. Lungs/Pleura: Bilateral diffuse interlobular septal thickening and ground-glass opacities. Linear parenchymal bands at the right lung base likely represent atelectasis or scarring. No significant pleural effusion. No pneumothorax. Upper Abdomen: No acute abnormality. Musculoskeletal: No suspicious bone lesions identified. IMPRESSION: 1. Cardiomegaly with bilateral interlobular septal thickening and ground-glass densities are suggestive of pulmonary edema. 2. Enlarged right lower paratracheal node is likely reactive. Additional prominent mediastinal lymph nodes appear similar to slightly decreased in size compared to the prior exam. 3. Aortic atherosclerosis Aortic Atherosclerosis (ICD10-I70.0). Electronically Signed   By: Hart Robinsons M.D.   On: 01/22/2023 08:47   MR LUMBAR SPINE WO CONTRAST  Result Date: 01/22/2023 CLINICAL DATA:  Lumbar radiculopathy with infection suspected EXAM: MRI LUMBAR SPINE WITHOUT CONTRAST TECHNIQUE: Multiplanar, multisequence MR imaging of the  lumbar spine was performed. No intravenous contrast was administered. COMPARISON:  None Available. FINDINGS: Segmentation:  Standard. Alignment:  Physiologic. Vertebrae: No fracture, evidence of discitis, or bone lesion. Mild marrow edema and fatty marrow conversion at the corners of the L2-L5 vertebrae. No bridging  osteophyte is seen. Limited coverage of the sacroiliac joints without detected inflammation or ankylosis. Conus medullaris and cauda equina: Conus extends to the L2 level. Conus and cauda equina appear normal. Paraspinal and other soft tissues: No perispinal mass or inflammation. Symmetric renal atrophy in the setting of end-stage renal disease. Disc levels: Diffusely preserved disc height and hydration with mild disc bulging at L2-3 to L4-5. Up to mild facet spurring at L4-5 and L3-4. No herniation or impingement. IMPRESSION: 1. Mild multilevel vertebral body corner edema which could be from spondylosis or spondyloarthropathy. 2. Limited degenerative change without impingement. Electronically Signed   By: Tiburcio Pea M.D.   On: 01/22/2023 08:22   DG Chest 2 View  Result Date: 01/21/2023 CLINICAL DATA:  Shortness of breath EXAM: CHEST - 2 VIEW COMPARISON:  X-ray 11/30/2022 FINDINGS: Stable cardiopericardial silhouette with a calcified and tortuous aorta. No pneumothorax. Blunted right costophrenic angle. Tiny effusion versus pleural thickening. There are some subtle opacity in the right lung base, possibly in the middle lobe with some thickening of the adjacent fissures. Slight central vascular congestion. Left lung is without consolidation. Pneumothorax. IMPRESSION: Slight blunting of the right costophrenic angle. Tiny effusion versus pleural thickening. Subtle opacity towards the middle lobe. Infiltrates not excluded. Recommend follow-up. Electronically Signed   By: Karen Kays M.D.   On: 01/21/2023 12:27    Medications:   amLODipine  10 mg Oral Daily   carvedilol  25 mg Oral BID WC   gabapentin  300 mg Oral Daily   heparin  5,000 Units Subcutaneous Q8H   insulin aspart  0-6 Units Subcutaneous TID WC   lidocaine  2 patch Transdermal Q24H    Dialysis Orders: MWF - AF 4:15hr, 400/A1.5, EDW 99.5kg, 2K/2Ca, AVF, heparin 5000 + 2000 bolus   Assessment/Plan:  Dyspnea/presumed overload: COVID  neg. Required BiPAP on admit, improved with HD and now on nasal O2.  ESRD:  Usual MWF - next HD tomorrow. Back pain: LS MRI with multi-level corner body edema, no spinal stenosis. Hypertension/volume: BP sky high on admit, much better today. Keep lowering EDW.  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  Metabolic bone disease: Ca/Phos ok. Continue home meds.  L leg wound: Since 11/2022 at least - prev grew pseudomonas, looks dry on exam.  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  Ozzie Hoyle, PA-C 01/22/2023, 3:05 PM  [employer]

## 2023-01-22 NOTE — Progress Notes (Signed)
3 [place] nurse reviewed chart.  No SBAR present. Secure chat message sent to ER nurse. Room is still dirty at this time.

## 2023-01-22 NOTE — ED Notes (Signed)
Patient transported to CT 
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
## 2023-01-22 NOTE — Progress Notes (Signed)
IR Procedure Request - mediastinal lymph node biopsy  57 yo male inpatient. History of DM, ESRD in HDm HTN. Presented to the ED with dyspnea, back pain, headachem [place]a and vomitting. CT chest from today reads Additional prominent mediastinal lymph nodes appear similar to slightly decreased in size compared to the prior exam. CT Angio Chest from 2.13.24 reads Mediastinal lymphadenopathy is favored reactive. Team is requesting mediastinal lymph node biopsy for further evaluation for sarcoidosis.   After review of procedure request by IR Attending Dr. Marliss Coots recommend Team consult pulmonology. This was communicated to Team via EPIC Chat.   Please call IR with questions/concerns.

## 2023-01-23 DIAGNOSIS — J9601 Acute respiratory failure with hypoxia: Secondary | ICD-10-CM | POA: Diagnosis not present

## 2023-01-23 LAB — GLUCOSE, CAPILLARY
Glucose-Capillary: 115 mg/dL — ABNORMAL HIGH (ref 70–99)
Glucose-Capillary: 104 mg/dL — ABNORMAL HIGH (ref 70–99)
Glucose-Capillary: 214 mg/dL — ABNORMAL HIGH (ref 70–99)
Glucose-Capillary: 118 mg/dL — ABNORMAL HIGH (ref 70–99)

## 2023-01-23 LAB — HEPATITIS B SURFACE ANTIBODY, QUANTITATIVE: Hep B S AB Quant (Post): 1568 m[IU]/mL

## 2023-01-23 LAB — ANGIOTENSIN CONVERTING ENZYME: Angiotensin-Converting Enzyme: 19 U/L (ref 14–82)

## 2023-01-23 MED ORDER — HEPARIN SODIUM (PORCINE) 1000 UNIT/ML DIALYSIS
8000.0000 [IU] | Freq: Once | INTRAMUSCULAR | Status: DC
  Administered 2023-01-23: 8000 [IU] via INTRAVENOUS_CENTRAL
  Filled 2023-01-23: qty 8

## 2023-01-23 MED ORDER — TENAPANOR HCL (CKD) 30 MG PO TABS: 1.0000 | ORAL_TABLET | Freq: Two times a day (BID) | ORAL | Status: DC

## 2023-01-23 MED ORDER — SEVELAMER CARBONATE 800 MG PO TABS
2400.0000 mg | ORAL_TABLET | Freq: Three times a day (TID) | ORAL | Status: DC
  Administered 2023-01-23 – 2023-01-24 (×4): 2400 mg via ORAL
  Filled 2023-01-23 (×4): qty 3

## 2023-01-23 NOTE — Consult Note (Signed)
NAME:  [name], MRN:  [id number], DOB:  1965/08/03, LOS: 1 ADMISSION DATE:  01/21/2023, CONSULTATION DATE: 01/23/2023 REFERRING MD: Teaching service, CHIEF COMPLAINT: I cannot breathe  History of Present Illness:  57 year old male who has a plethora of health issues that are well-documented below of note he has had required a video-assisted thoracic surgery intervention in the past for empyema so he reports.  He also has chronic lower extremity edema wounds have been positive for Pseudomonas in the past.  He has end-stage renal disease and currently is undergoing hemodialysis.  He notes he has sudden onset of shortness of breath he notes he feels like it may be a panic attack.  She denies fevers chills sweats productive cough or any other symptoms of malaise.  Currently he is awake alert sitting up no acute distress.  There is some question whether he could have sarcoid he does have an elevated sed rate.  Further evaluation may be needed at this time.  Pertinent  Medical History   Past Medical History:  Diagnosis Date   Acute kidney injury (HCC) 12/28/2014   Adjustment disorder with mixed anxiety and depressed mood 12/28/2014   Asthma    CHF (congestive heart failure) (HCC)    CPAP (continuous positive airway pressure) dependence    Diabetes mellitus without complication (HCC)    DM (diabetes mellitus) type II controlled with renal manifestation (HCC) 12/28/2014   DM (diabetes mellitus) type II controlled, neurological manifestation (HCC) 12/28/2014   Encephalopathy, hypertensive 12/28/2014   GERD (gastroesophageal reflux disease)    Hypertension    Hypertensive emergency without congestive heart failure 12/27/2014   Pneumonia    Possible Panic disorder 12/28/2014   Renal disorder    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 rate 15, height 5\' 7"  (1.702 m), weight 97.8 kg, SpO2 100%.       No intake or output data in the 24 hours ending 01/23/23 1010 Filed Weights   01/22/23 1508 01/23/23 0500 01/23/23 0835  Weight: 98.2 kg 98.6 kg 97.8 kg    Examination: General: Well-nourished well-developed male no acute distress HENT: No JVD or lymphadenopathy appreciated Lungs: Diminished in the bases Cardiovascular: Heart sounds are irregular Abdomen: Obese soft Extremities: Lower extremities with extensive wounds Neuro: Grossly intact without focal defect   Resolved Hospital Problem list     Assessment & Plan:  Acute respiratory failure in a patient with multiple health issues.  He reports that he also become short of breath to the point he thinks is almost a mental issue.  Currently he is in no acute distress at rest.  He does have end-stage renal disease.  He has type 2 diabetes he has chronic lower extremity infection that is being treated by wound care with a history of Pseudomonas bacteremia.  CT scan is consistent with edema with a large nodes with a question of sarcoid being raised.  He has a known sed rate that is elevated greater than 100.  Pulmonary critical care has been asked to evaluate Sarcoid workup Further pulmonary evaluation Questionable obstructive sleep apnea Chronic lower extremity wounds  End-stage renal disease Congestive heart failure Hypertension Diabetes Mixed anxiety  depression Panic disorder History of pleural effusions requiring chest tube and surgical intervention  Best Practice (right click and "Reselect all SmartList Selections" daily)  Per primary  Labs   CBC: Recent Labs  Lab 01/21/23 1100 01/22/23 0412  WBC 9.8 9.7  HGB 9.8* 10.6*  HCT 31.2* 33.2*  MCV 97.2 95.1  PLT 364 382     Basic Metabolic Panel: Recent Labs  Lab 01/21/23 1100 01/22/23 0412  NA 134* 135  K 4.4 3.8  CL 94* 93*  CO2 25 29  GLUCOSE 172* 88  BUN 52* 25*  CREATININE 12.22* 7.45*  CALCIUM 9.7 9.6  PHOS  --  4.7*   GFR: Estimated Creatinine Clearance: 12.3 mL/min (A) (by C-G formula based on SCr of 7.45 mg/dL (H)). Recent Labs  Lab 01/21/23 1100 01/22/23 0412  WBC 9.8 9.7    Liver Function Tests: Recent Labs  Lab 01/21/23 1100 01/22/23 0412  AST 10*  --   ALT 7  --   ALKPHOS 60  --   BILITOT 0.6  --   PROT 6.9  --   ALBUMIN 3.1* 3.2*   Recent Labs  Lab 01/21/23 1100  LIPASE 27   No results for input(s): "AMMONIA" in the last 168 hours.  ABG    Component Value Date/Time   PHART 7.286 (L) 05/17/2017 1817   PCO2ART 46.6 05/17/2017 1817   PO2ART 81.0 (L) 05/17/2017 1817   HCO3 24.9 03/19/2022 0305   TCO2 27 12/20/2021 1224   ACIDBASEDEF 0.8 03/19/2022 0305   O2SAT 79.6 03/19/2022 0305     Coagulation Profile: No results for input(s): "INR", "PROTIME" in the last 168 hours.  Cardiac Enzymes: No results for input(s): "CKTOTAL", "CKMB", "CKMBINDEX", "TROPONINI" in the last 168 hours.  HbA1C: Hgb A1c MFr Bld  Date/Time Value Ref Range Status  11/30/2022 10:33 PM 7.2 (H) 4.8 - 5.6 % Final    Comment:    (NOTE) Pre diabetes:          5.7%-6.4%  Diabetes:              >6.4%  Glycemic control for   <7.0% adults with diabetes   05/23/2022 02:54 AM 6.4 (H) 4.8 - 5.6 % Final    Comment:    (NOTE) Pre diabetes:          5.7%-6.4%  Diabetes:              >6.4%  Glycemic control for   <7.0% adults with diabetes     CBG: Recent Labs  Lab 01/22/23 0829 01/22/23 1318 01/22/23 1734 01/22/23 2039 01/23/23 0610  GLUCAP 111* 199* 166* 152* 115*    Review of Systems:   10 point review of system taken, please see HPI for positives and negatives. Reports several events of sudden shortness of breath Denies fevers chills or sweats Denies  hemoptysis  Past Medical History:  He,  has a past medical history of Acute kidney injury (HCC) (12/28/2014), Adjustment disorder with mixed anxiety and depressed mood (12/28/2014), Asthma, CHF (congestive heart failure) (HCC), CPAP (continuous positive airway pressure) dependence, Diabetes mellitus without complication (HCC), DM (diabetes mellitus) type II controlled with renal manifestation (HCC) (12/28/2014), DM (diabetes mellitus) type II controlled, neurological manifestation (HCC) (12/28/2014), Encephalopathy, hypertensive (12/28/2014), GERD (gastroesophageal reflux disease), Hypertension, Hypertensive emergency without congestive heart failure (12/27/2014), Pneumonia, Possible Panic disorder (12/28/2014), Renal disorder, 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   Surgical History:   Past Surgical History:  Procedure Laterality Date   AV FISTULA  PLACEMENT Left 01/05/2020   Procedure: LEFT ARM BRACHIOCEPHALIC ARTERIOVENOUS (AV) FISTULA;  Surgeon: Chuck Hint, MD;  Location: [facility] OR;  Service: Vascular;  Laterality: Left;   EMPYEMA DRAINAGE Right 05/17/2017   Procedure: EMPYEMA DRAINAGE AND DECORTICATION;  Surgeon: Delight Ovens, MD;  Location: [facility] OR;  Service: Thoracic;  Laterality: Right;   FLEXIBLE BRONCHOSCOPY  05/17/2017   Procedure: FLEXIBLE BRONCHOSCOPY;  Surgeon: Delight Ovens, MD;  Location: [facility] OR;  Service: Thoracic;;   INSERTION OF DIALYSIS CATHETER Right 12/12/2021   Procedure: INSERTION OF RIGHT INTERNAL JUGULAR DIALYSIS CATHETER;  Surgeon: Chuck Hint, MD;  Location: [facility] OR;  Service: Vascular;  Laterality: Right;   IR FLUORO GUIDE CV LINE RIGHT  01/01/2020   IR THORACENTESIS ASP PLEURAL SPACE W/IMG GUIDE  05/17/2017   IR US GUIDE VASC ACCESS RIGHT  01/01/2020   NO PAST SURGERIES     REVISON OF ARTERIOVENOUS FISTULA Left 12/12/2021   Procedure: REVISION OF LEFT ARM FISTULA BY PLICATION;  Surgeon: Chuck Hint, MD;  Location: [facility] OR;   Service: Vascular;  Laterality: Left;   THORACOTOMY/LOBECTOMY Right 05/17/2017   Procedure: RIGHT MINI THORACOTOMY;  Surgeon: Delight Ovens, MD;  Location: [facility] OR;  Service: Thoracic;  Laterality: Right;   VIDEO ASSISTED THORACOSCOPY (VATS)/EMPYEMA Right 05/17/2017   Procedure: RIGHT VIDEO ASSISTED THORACOSCOPY (VATS)/EMPYEMA;  Surgeon: Delight Ovens, MD;  Location: [facility] OR;  Service: Thoracic;  Laterality: Right;     Social History:   reports that he has never smoked. He has never been exposed to tobacco smoke. He has never used smokeless tobacco. He reports that he does not drink alcohol and does not use drugs.   Family History:  His family history includes Hypertension in his mother.   Allergies Allergies  Allergen Reactions   Oxycodone Itching     Home Medications  Prior to Admission medications   Medication Sig Start Date End Date Taking? Authorizing Provider  acetaminophen (TYLENOL) 325 MG tablet Take 2 tablets (650 mg total) by mouth every 6 (six) hours. 12/03/22  Yes Lockie Mola, MD  albuterol (PROVENTIL) (2.5 MG/3ML) 0.083% nebulizer solution Take 3 mLs (2.5 mg total) by nebulization every 6 (six) hours as needed for wheezing or shortness of breath. 09/25/22  Yes Alfonse Spruce, MD  albuterol (VENTOLIN HFA) 108 (90 Base) MCG/ACT inhaler Inhale 2 puffs into the lungs every 6 (six) hours as needed for wheezing or shortness of breath. 09/25/22  Yes Alfonse Spruce, MD  amLODipine (NORVASC) 10 MG tablet Take 1 tablet (10 mg total) by mouth daily. 05/24/22  Yes Rocky Morel, DO  calcitRIOL (ROCALTROL) 0.25 MCG capsule Take 1 capsule (0.25 mcg total) by mouth every Monday, Wednesday, and Friday. 01/08/20  Yes Albertine Grates, MD  camphor-menthol [facility]) lotion Apply topically 2 (two) times daily. Patient taking differently: Apply 1 Application topically 2 (two) times daily. 12/03/22  Yes Lockie Mola, MD  carvedilol (COREG) 25 MG tablet Take 1 tablet (25 mg total) by mouth  2 (two) times daily with a meal. 05/24/22  Yes Rocky Morel, DO  gabapentin (NEURONTIN) 600 MG tablet Take 600 mg by mouth daily.   Yes [provider]  levocetirizine (XYZAL) 5 MG tablet Take 1 tablet (5 mg total) by mouth every evening. 09/25/22  Yes Alfonse Spruce, MD  lidocaine (XYLOCAINE) 2 % jelly Apply 1 Application topically daily. 11/26/22  Yes Alvira Monday, MD  multivitamin (RENA-VIT) TABS tablet Take 1 tablet by mouth at bedtime. 05/24/22  Yes Rocky Morel, DO  Nutritional Supplements (,FEEDING SUPPLEMENT, PROSOURCE PLUS) liquid Take 30 mLs by mouth 2 (two) times daily between meals. 12/03/22  Yes Lockie Mola, MD  sevelamer carbonate (RENVELA) 800 MG tablet Take 3 tablets (2,400 mg total) by mouth 3 (three) times daily with meals. 12/03/22  Yes Lockie Mola, MD  tiZANidine (ZANAFLEX) 4 MG tablet Take 4 mg by mouth 2 (two) times daily. 12/11/22  Yes [provider]  traMADol (ULTRAM) 50 MG tablet Take 50 mg by mouth 2 (two) times daily as needed. 12/27/22  Yes [provider]  Vitamin D, Ergocalciferol, (DRISDOL) 1.25 MG (50000 UNIT) CAPS capsule Take 50,000 Units by mouth See admin instructions. Take one tablet by mouth on Monday, Wednesdays and Fridays per patient 08/17/19  Yes [provider]  XPHOZAH 30 MG TABS Take 1 tablet by mouth 2 (two) times daily. 01/15/23  Yes [provider]  Accu-Chek Softclix Lancets lancets Use as directed.  For insulin-dependent type 2 diabetes Monitor  glucose 3 times a day Patient taking differently: 1 each by Other route See admin instructions. Use as directed.  For insulin-dependent type 2 diabetes Monitor  glucose 3 times a day 01/06/20   Albertine Grates, MD  glucose blood test strip Use as instructed For insulin-dependent type 2 diabetes Monitor  glucose 3 times a day Patient taking differently: 1 each by Other route See admin instructions. Use as instructed For insulin-dependent type 2  diabetes Monitor  glucose 3 times a day 01/06/20   Albertine Grates, MD  insulin aspart protamine - aspart (NOVOLOG MIX 70/30 FLEXPEN) (70-30) 100 UNIT/ML FlexPen Inject 15-30 Units into the skin 2 (two) times daily.  05/24/22  [provider]     Critical care time: Elizebeth Brooking Derwin Reddy ACNP Acute Care Nurse Practitioner [facility] Pulmonary/Critical Care Please consult Amion 01/23/2023, 10:10 AM

## 2023-01-23 NOTE — Progress Notes (Signed)
Pt resting on 2l [place]. No resp distress noted at this time. Will continue to monitor.

## 2023-01-23 NOTE — Progress Notes (Signed)
[name] Progress Note   Subjective:   Seen in dialysis.  Tolerating treatment fine.  Weaned to RA during exam and O2 sat remains high 90s-100%.  He is feeling improved with no new issues today.  Objective Vitals:   01/23/23 0845 01/23/23 0900 01/23/23 0930 01/23/23 1000  BP: (!) 169/98 (!) 165/98 (!) 154/84 (!) 108/57  Pulse: 66 65 65 64  Resp: 14 16 15 15   Temp:      TempSrc:      SpO2: 100% 100% 97% 100%  Weight:      Height:       Physical Exam General: Well appearing man, NAD. Heart: RRR; no murmur Lungs: CTAB; no rales Abdomen: soft Extremities: No LE edema, L shin wound is dry Dialysis Access: Aneurysmal LUE AVF + bruit  Additional Objective Labs: Basic Metabolic Panel: Recent Labs  Lab 01/21/23 1100 01/22/23 0412  NA 134* 135  K 4.4 3.8  CL 94* 93*  CO2 25 29  GLUCOSE 172* 88  BUN 52* 25*  CREATININE 12.22* 7.45*  CALCIUM 9.7 9.6  PHOS  --  4.7*   Liver Function Tests: Recent Labs  Lab 01/21/23 1100 01/22/23 0412  AST 10*  --   ALT 7  --   ALKPHOS 60  --   BILITOT 0.6  --   PROT 6.9  --   ALBUMIN 3.1* 3.2*   Recent Labs  Lab 01/21/23 1100  LIPASE 27   CBC: Recent Labs  Lab 01/21/23 1100 01/22/23 0412  WBC 9.8 9.7  HGB 9.8* 10.6*  HCT 31.2* 33.2*  MCV 97.2 95.1  PLT 364 382   Studies/Results: DG Sacrum/Coccyx  Result Date: 01/22/2023 CLINICAL DATA:  Low back pain.  No known trauma. EXAM: SACRUM AND COCCYX - 2+ VIEW COMPARISON:  None Available. FINDINGS: No acute fracture or dislocation. Vascular calcifications. The soft tissues are unremarkable. IMPRESSION: Negative. Electronically Signed   By: Elgie Collard M.D.   On: 01/22/2023 21:10   CT Chest Wo Contrast  Result Date: 01/22/2023 CLINICAL DATA:  Shortness of breath.  History of dialysis. EXAM: CT CHEST WITHOUT CONTRAST TECHNIQUE: Multidetector CT imaging of the chest was performed following the standard protocol without IV contrast. RADIATION DOSE  REDUCTION: This exam was performed according to the departmental dose-optimization program which includes automated exposure control, adjustment of the mA and/or kV according to patient size and/or use of iterative reconstruction technique. COMPARISON:  CT chest dated May 22, 2022. FINDINGS: Cardiovascular: The heart size is enlarged. Multivessel coronary artery calcifications. Aortic atherosclerotic calcifications. No pericardial effusion. Mediastinum/Nodes: A right lower paratracheal node measures 14 mm in short axis, previously 15 mm. A prevascular node measures 9 mm in short axis, previously 14 mm. Additional prominent mediastinal lymph nodes appear similar to slightly decreased in size compared to the prior exam. No enlarged axillary lymph nodes. Thyroid gland, trachea, and esophagus demonstrate no significant findings. Lungs/Pleura: Bilateral diffuse interlobular septal thickening and ground-glass opacities. Linear parenchymal bands at the right lung base likely represent atelectasis or scarring. No significant pleural effusion. No pneumothorax. Upper Abdomen: No acute abnormality. Musculoskeletal: No suspicious bone lesions identified. IMPRESSION: 1. Cardiomegaly with bilateral interlobular septal thickening and ground-glass densities are suggestive of pulmonary edema. 2. Enlarged right lower paratracheal node is likely reactive. Additional prominent mediastinal lymph nodes appear similar to slightly decreased in size compared to the prior exam. 3. Aortic atherosclerosis Aortic Atherosclerosis (ICD10-I70.0). Electronically Signed   By: Hart Robinsons M.D.   On: 01/22/2023 08:47  MR LUMBAR SPINE WO CONTRAST  Result Date: 01/22/2023 CLINICAL DATA:  Lumbar radiculopathy with infection suspected EXAM: MRI LUMBAR SPINE WITHOUT CONTRAST TECHNIQUE: Multiplanar, multisequence MR imaging of the lumbar spine was performed. No intravenous contrast was administered. COMPARISON:  None Available. FINDINGS:  Segmentation:  Standard. Alignment:  Physiologic. Vertebrae: No fracture, evidence of discitis, or bone lesion. Mild marrow edema and fatty marrow conversion at the corners of the L2-L5 vertebrae. No bridging osteophyte is seen. Limited coverage of the sacroiliac joints without detected inflammation or ankylosis. Conus medullaris and cauda equina: Conus extends to the L2 level. Conus and cauda equina appear normal. Paraspinal and other soft tissues: No perispinal mass or inflammation. Symmetric renal atrophy in the setting of end-stage renal disease. Disc levels: Diffusely preserved disc height and hydration with mild disc bulging at L2-3 to L4-5. Up to mild facet spurring at L4-5 and L3-4. No herniation or impingement. IMPRESSION: 1. Mild multilevel vertebral body corner edema which could be from spondylosis or spondyloarthropathy. 2. Limited degenerative change without impingement. Electronically Signed   By: Tiburcio Pea M.D.   On: 01/22/2023 08:22   DG Chest 2 View  Result Date: 01/21/2023 CLINICAL DATA:  Shortness of breath EXAM: CHEST - 2 VIEW COMPARISON:  X-ray 11/30/2022 FINDINGS: Stable cardiopericardial silhouette with a calcified and tortuous aorta. No pneumothorax. Blunted right costophrenic angle. Tiny effusion versus pleural thickening. There are some subtle opacity in the right lung base, possibly in the middle lobe with some thickening of the adjacent fissures. Slight central vascular congestion. Left lung is without consolidation. Pneumothorax. IMPRESSION: Slight blunting of the right costophrenic angle. Tiny effusion versus pleural thickening. Subtle opacity towards the middle lobe. Infiltrates not excluded. Recommend follow-up. Electronically Signed   By: Karen Kays M.D.   On: 01/21/2023 12:27    Medications:   amLODipine  10 mg Oral Daily   carvedilol  25 mg Oral BID WC   gabapentin  300 mg Oral Daily   heparin  5,000 Units Subcutaneous Q8H   insulin aspart  0-6 Units  Subcutaneous TID WC   lidocaine  2 patch Transdermal Q24H    Dialysis Orders: MWF - AF 4:15hr, 400/A1.5, EDW 99.5kg, 2K/2Ca, AVF, heparin 5000 + 2000 bolus   Assessment/Plan:  Dyspnea/presumed overload: COVID neg. Required BiPAP on admit, improved with HD and now on 1L na > RA.  ESRD:  Usual MWF - next HD today. Back pain: LS MRI with multi-level corner body edema, no spinal stenosis. Hypertension/volume: BP improved with UF. + resumed home meds  Anemia of ESRD: Hgb 10.6 - better.  Metabolic bone disease: Ca/Phos ok. Continue home meds.  L leg wound: Since 11/2022 at least - prev grew pseudomonas, looks dry on exam.  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  Pulm consulting today.  OK for discharge from nephrology perspective when otherwise able.  Will have new EDW at d/c.  Marland KitchenEstill Bakes MD [facility] Pager [phone]

## 2023-01-23 NOTE — Progress Notes (Signed)
HD Progress Note:   01/23/23 1219  Vitals  Temp (!) 97.5 F (36.4 C)  BP 111/74  MAP (mmHg) 86  BP Location Right Arm  BP Method Automatic  Patient Position (if appropriate) Lying  Pulse Rate 66  ECG Heart Rate 67  Resp 18  Oxygen Therapy  SpO2 94 %  O2 Device Room Air  Patient Activity (if Appropriate) In bed  During Treatment Monitoring  HD Safety Checks Performed Yes  Intra-Hemodialysis Comments Tx completed;Tolerated well  Dialysis Fluid Bolus Normal Saline  Bolus Amount (mL) 300 mL  Post Treatment  Dialyzer Clearance Lightly streaked  Hemodialysis Intake (mL) 0 mL  Liters Processed 79.4  Fluid Removed (mL) 4000 mL  Tolerated HD Treatment Yes  Post-Hemodialysis Comments Tolerated 4L well.  AVG/AVF Arterial Site Held (minutes) 10 minutes  AVG/AVF Venous Site Held (minutes) 10 minutes  Fistula / Graft Left Upper arm Arteriovenous fistula  Placement Date/Time: 12/12/21 0941   Placed prior to admission: No  Orientation: Left  Access Location: Upper arm  Access Type: Arteriovenous fistula  Site Condition No complications  Fistula / Graft Assessment Present;Thrill;Bruit  Status Deaccessed  Drainage Description None
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
## 2023-01-24 ENCOUNTER — Other Ambulatory Visit (HOSPITAL_COMMUNITY): Payer: Self-pay

## 2023-01-24 DIAGNOSIS — J9601 Acute respiratory failure with hypoxia: Secondary | ICD-10-CM | POA: Diagnosis not present

## 2023-01-24 LAB — CBC
HCT: 35.5 % — ABNORMAL LOW (ref 39.0–52.0)
Hemoglobin: 11.4 g/dL — ABNORMAL LOW (ref 13.0–17.0)
MCH: 30.6 pg (ref 26.0–34.0)
MCHC: 32.1 g/dL (ref 30.0–36.0)
MCV: 95.2 fL (ref 80.0–100.0)
Platelets: 396 10*3/uL (ref 150–400)
RBC: 3.73 MIL/uL — ABNORMAL LOW (ref 4.22–5.81)
RDW: 13.8 % (ref 11.5–15.5)
WBC: 7.7 10*3/uL (ref 4.0–10.5)
nRBC: 0 % (ref 0.0–0.2)

## 2023-01-24 LAB — RENAL FUNCTION PANEL
Albumin: 3.1 g/dL — ABNORMAL LOW (ref 3.5–5.0)
Anion gap: 13 (ref 5–15)
BUN: 28 mg/dL — ABNORMAL HIGH (ref 6–20)
CO2: 31 mmol/L (ref 22–32)
Calcium: 10.2 mg/dL (ref 8.9–10.3)
Chloride: 91 mmol/L — ABNORMAL LOW (ref 98–111)
Creatinine, Ser: 8.35 mg/dL — ABNORMAL HIGH (ref 0.61–1.24)
GFR, Estimated: 7 mL/min — ABNORMAL LOW (ref >60)
Glucose, Bld: 165 mg/dL — ABNORMAL HIGH (ref 70–99)
Phosphorus: 6.2 mg/dL — ABNORMAL HIGH (ref 2.5–4.6)
Potassium: 4 mmol/L (ref 3.5–5.1)
Sodium: 135 mmol/L (ref 135–145)

## 2023-01-24 LAB — GLUCOSE, CAPILLARY
Glucose-Capillary: 143 mg/dL — ABNORMAL HIGH (ref 70–99)
Glucose-Capillary: 130 mg/dL — ABNORMAL HIGH (ref 70–99)
Glucose-Capillary: 125 mg/dL — ABNORMAL HIGH (ref 70–99)

## 2023-01-24 LAB — RHEUMATOID FACTOR: Rheumatoid fact SerPl-aCnc: <10 [IU]/mL (ref <14.0)

## 2023-01-24 LAB — CYCLIC CITRUL PEPTIDE ANTIBODY, IGG/IGA: CCP Antibodies IgG/IgA: 7 U (ref 0–19)

## 2023-01-24 MED ORDER — TRAMADOL HCL 50 MG PO TABS: 50.0000 mg | ORAL_TABLET | Freq: Two times a day (BID) | ORAL | 0 refills | Status: DC | PRN

## 2023-01-24 MED ORDER — HYDROMORPHONE HCL 2 MG PO TABS: 1.0000 mg | ORAL_TABLET | ORAL | 0 refills | Status: DC | PRN

## 2023-01-24 MED ORDER — GABAPENTIN 300 MG PO CAPS
300.0000 mg | ORAL_CAPSULE | Freq: Every day | ORAL | 0 refills | Status: DC
  Filled 2023-01-24: qty 14, 14d supply, fill #0

## 2023-01-24 NOTE — Discharge Planning (Signed)
[facility] Patient Discharge Orders - [facility] CLINIC: [facility]  Patient's name: [name] Admit/DC Dates: 01/21/2023 - 01/24/23  DISCHARGE DIAGNOSES: Acute Hypoxic Respiratory Failure - improved with HD HTN - improved with HD Mediastinal/hilar LAD - will f/u with pulm as outpatient, there was concern for sarcoid - ACE normal Back pain - imaging with mild vertebral body corner edema Leg wound - dry, no abx at this time, continue wound clinic  HD ORDER CHANGES: Heparin change: no EDW Change: yes New EDW: 95kg [place] Change: no  ANEMIA MANAGEMENT: Aranesp: Given: no    ESA dose for discharge: Per protocol IV Iron dose at discharge: Per protocol Transfusion: Given: no  BONE/MINERAL MEDICATIONS: Hectorol/Calcitriol change: no Sensipar/Parsabiv change: no  ACCESS INTERVENTION/CHANGE: no  RECENT LABS: Recent Labs  Lab 01/24/23 0258  HGB 11.4*  NA 135  K 4.0  CALCIUM 10.2  PHOS 6.2*  ALBUMIN 3.1*    IV ANTIBIOTICS: no Details:  OTHER ANTICOAGULATION: On Coumadin?: no  OTHER/APPTS/LAB ORDERS:   D/C Meds to be reconciled by nurse after every discharge.  Completed By: Ozzie Hoyle, PA-C [facility] Pager [phone]   Reviewed by: MD:______ RN_______

## 2023-01-24 NOTE — Progress Notes (Signed)
[facility] Progress Note   Subjective:   Seen in room with wife bedside.  He's improved.  Well below EDW now - 10lbs roughly.  On RA still.  Pulm defers to outpt mgmt.  Likely d/c today  Objective Vitals:   01/23/23 2019 01/23/23 2341 01/24/23 0357 01/24/23 0815  BP: (!) 122/91 110/75 136/85 (!) 157/87  Pulse: 69 65 72 71  Resp: 16 16 14 15   Temp: 98.5 F (36.9 C) 98.7 F (37.1 C) 98.4 F (36.9 C)   TempSrc: Oral Oral Oral   SpO2: 96% 96% 96% 99%  Weight:   95.4 kg   Height:       Physical Exam General: Well appearing man, NAD. Heart: RRR; no murmur Lungs: CTAB; no rales Abdomen: soft Extremities: No LE edema, L shin wound is dry Dialysis Access: Aneurysmal LUE AVF + bruit  Additional Objective Labs: Basic Metabolic Panel: Recent Labs  Lab 01/21/23 1100 01/22/23 0412 01/24/23 0258  NA 134* 135 135  K 4.4 3.8 4.0  CL 94* 93* 91*  CO2 25 29 31   GLUCOSE 172* 88 165*  BUN 52* 25* 28*  CREATININE 12.22* 7.45* 8.35*  CALCIUM 9.7 9.6 10.2  PHOS  --  4.7* 6.2*   Liver Function Tests: Recent Labs  Lab 01/21/23 1100 01/22/23 0412 01/24/23 0258  AST 10*  --   --   ALT 7  --   --   ALKPHOS 60  --   --   BILITOT 0.6  --   --   PROT 6.9  --   --   ALBUMIN 3.1* 3.2* 3.1*   Recent Labs  Lab 01/21/23 1100  LIPASE 27   CBC: Recent Labs  Lab 01/21/23 1100 01/22/23 0412 01/24/23 0258  WBC 9.8 9.7 7.7  HGB 9.8* 10.6* 11.4*  HCT 31.2* 33.2* 35.5*  MCV 97.2 95.1 95.2  PLT 364 382 396   Studies/Results: DG Sacrum/Coccyx  Result Date: 01/22/2023 CLINICAL DATA:  Low back pain.  No known trauma. EXAM: SACRUM AND COCCYX - 2+ VIEW COMPARISON:  None Available. FINDINGS: No acute fracture or dislocation. Vascular calcifications. The soft tissues are unremarkable. IMPRESSION: Negative. Electronically Signed   By: Elgie Collard M.D.   On: 01/22/2023 21:10    Medications:   amLODipine  10 mg Oral Daily   carvedilol  25 mg Oral BID WC    gabapentin  300 mg Oral Daily   heparin  5,000 Units Subcutaneous Q8H   insulin aspart  0-6 Units Subcutaneous TID WC   lidocaine  2 patch Transdermal Q24H   sevelamer carbonate  2,400 mg Oral TID WC    Dialysis Orders: MWF - AF 4:15hr, 400/A1.5, EDW 99.5kg, 2K/2Ca, AVF, heparin 5000 + 2000 bolus   Assessment/Plan:  Dyspnea/presumed overload: COVID neg. Required BiPAP on admit, improved with HD and now on RA.  Appears volume was acute issue.  ESRD:  Usual MWF - next HD tomorrow outpt if able. Back pain: LS MRI with multi-level corner body edema, no spinal stenosis. Hypertension/volume: BP improved with UF. + resumed home meds.  Will need new EDW - post wt was requested 10/16 tx but not recorded. EDW 99.5, lowest wt this admission was 95.4kg this AM and he confirms this was a standing wt.    Anemia of ESRD: Hgb 10-11s.  Metabolic bone disease: Ca/Phos ok. Continue home meds.  L leg wound: Since 11/2022 at least - prev grew pseudomonas, looks dry on exam.  Mediastinal LAD: Nodes present  for years. being eval for sarcoidosis per notes.  Pulm consulted - ok to f/u with outpt pulm for ongoing care.   OK for discharge from nephrology perspective when otherwise able.   Estill Bakes MD [facility] Pager [phone]
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
## 2023-01-24 NOTE — Evaluation (Signed)
Occupational Therapy Evaluation Patient Details Name: [name] MRN: [id number] DOB: 1965-12-23 Today's Date: 01/24/2023   History of Present Illness 57 y/o male presents to [facility] 10/14 with  worsening SOB, admitted with acute hypoxic respiratory failure and severe HTN. CT chest showed cardiomegaly w/ pulmonary edema, enlarged R lower paratracheal node, and stable chronic mediastinal lymph nodes. Pt also with R LE closed wound, L LE open wound, and chronic LBP. PMH: ESRD on HD MWF, CHF, DM II, HTN, OSA, A fib.   Clinical Impression   At baseline, pt performs ADLs Independent to Min assist depending on pain level and performs functional mobility Independent to Mod I without an AD but reports furniture walking in the home. Pt now presents with decreased activity tolerance, decreased balance during functional tasks, pain limiting functional level, and decreased safety and independence with ADLs and functional mobility transfers. Pt currently demonstrates ability to complete UB ADLs Independent to Contact guard assist, LB ADLs with Contact guard to Min assist, and functional transfers/mobility with Contact guard assist without an AD. Pt will benefit from acute skilled OT services to address deficits outlined below and increase safety and independence with functional tasks. Post acute discharge, pt will benefit from continued skilled OT services in the home to maximize rehab potential.       If plan is discharge home, recommend the following: A little help with walking and/or transfers;A little help with bathing/dressing/bathroom;Assistance with cooking/housework;Assist for transportation;Help with stairs or ramp for entrance    Functional Status Assessment  Patient has had a recent decline in their functional status and demonstrates the ability to make significant improvements in function in a reasonable and predictable amount of time.  Equipment Recommendations  None recommended by OT (Pt has all  needed equipment)    Recommendations for Other Services       Precautions / Restrictions Precautions Precautions: Fall Restrictions Weight Bearing Restrictions: No      Mobility Bed Mobility                    Transfers Overall transfer level: Needs assistance Equipment used: None Transfers: Sit to/from Stand, Bed to chair/wheelchair/BSC Sit to Stand: Contact guard assist     Step pivot transfers: Contact guard assist     General transfer comment: CGA for safety, slightly increased time due to stiffness and LBP      Balance Overall balance assessment: Needs assistance, Mild deficits observed, not formally tested Sitting-balance support: No upper extremity supported, Feet supported Sitting balance-Leahy Scale: Fair Sitting balance - Comments: Pt often leaning from side to side and resting on elbow due to lower back pain.   Standing balance support: No upper extremity supported, During functional activity Standing balance-Leahy Scale: Fair Standing balance comment: pt able to stand with CGA and no AD, LOB when ambulating                           ADL either performed or assessed with clinical judgement   ADL Overall ADL's : Needs assistance/impaired Eating/Feeding: Independent;Modified independent;Sitting   Grooming: Independent;Modified independent;Sitting Grooming Details (indicate cue type and reason): Largely Supervision with occasional CGA due to pain with twisting Upper Body Bathing: Supervision/ safety;Contact guard assist;Sitting   Lower Body Bathing: Contact guard assist;Sitting/lateral leans;Sit to/from stand   Upper Body Dressing : Modified independent;Sitting   Lower Body Dressing: Minimal assistance;Sitting/lateral leans;Sit to/from stand   Toilet Transfer: Contact guard assist;Ambulation;Regular Toilet;Grab bars   Toileting- Clothing  Manipulation and Hygiene: Contact guard assist;Sitting/lateral lean;Sit to/from stand        Functional mobility during ADLs: Contact guard assist (requires CGA to maintain balance during functional mobility/transfer) General ADL Comments: Pt with decreased activity tolerance during functional tasks with pt fatiguing quickly.     Vision Baseline Vision/History: 1 Wears glasses (pt reports he is due for an eye exam and may need a  slightly stronger prescription) Ability to See in Adequate Light: 0 Adequate Patient Visual Report: No change from baseline       Perception         Praxis         Pertinent Vitals/Pain Pain Assessment Pain Assessment: Faces Faces Pain Scale: Hurts whole lot Pain Location: R low back Pain Descriptors / Indicators: Aching, Discomfort, Grimacing, Guarding Pain Intervention(s): Limited activity within patient's tolerance, Monitored during session, Repositioned, Premedicated before session     Extremity/Trunk Assessment Upper Extremity Assessment Upper Extremity Assessment: Right hand dominant;Overall [facility] for tasks assessed (Pt reports occasional B UE tremor that often causes him to drop items but no tremor present on this day. OT to conintue to monitor.)   Lower Extremity Assessment Lower Extremity Assessment: Defer to PT evaluation   Cervical / Trunk Assessment Cervical / Trunk Assessment: Normal   Communication Communication Communication: No apparent difficulties Cueing Techniques: Verbal cues   Cognition Arousal: Alert Behavior During Therapy: WFL for tasks assessed/performed Overall Cognitive Status: Within Functional Limits for tasks assessed                                 General Comments: AAOx4 and able to follow multi-step commands consistently. Occasionally requiring increased time for processing.     General Comments  VSS on RA; pt's wife present throughout session; healed wound on R anterior shin, open wound on L anterior shin    Exercises     Shoulder Instructions      Home Living Family/patient  expects to be discharged to:: Private residence Living Arrangements: Spouse/significant other Available Help at Discharge: Family;Available PRN/intermittently Type of Home: House Home Access: Level entry     Home Layout: One level     Bathroom Shower/Tub: Producer, television/film/video: Standard Bathroom Accessibility: Yes How Accessible: Accessible via walker Home Equipment: Shower seat;Rolling Walker (2 wheels);Cane - single point;Grab bars - tub/shower;Hand held shower head;Wheelchair - manual          Prior Functioning/Environment Prior Level of Function : Driving;Independent/Modified Independent;Needs assist (Pt funcitonal level varies due to fluctuating pain level.)             Mobility Comments: At night spouse helps pt walk due to balance. pt states that occasionally he furniture walks around the house due to pain in the leg. Spouse assists pt getting out of the car. ADLs Comments: spouse was helping with showers and dressing. She was assisting with washing his body in the shower. Spouse assisted with donning pants and shoes.        OT Problem List: Decreased activity tolerance;Impaired balance (sitting and/or standing);Decreased knowledge of use of DME or AE;Pain      OT Treatment/Interventions: Self-care/ADL training;Energy conservation;DME and/or AE instruction;Therapeutic activities;Patient/family education;Balance training    OT Goals(Current goals can be found in the care plan section) Acute Rehab OT Goals Patient Stated Goal: to return home, be as independent as possible, and have less pain OT Goal Formulation: With patient/family Time For Goal  Achievement: 02/07/23 Potential to Achieve Goals: Good ADL Goals Pt Will Perform Lower Body Bathing: sit to/from stand;sitting/lateral leans;with adaptive equipment;with modified independence Pt Will Perform Lower Body Dressing: with modified independence;sitting/lateral leans;sit to/from stand;with adaptive  equipment Pt Will Transfer to Toilet: with modified independence;ambulating;regular height toilet (with least restrictive AD) Pt Will Perform Toileting - Clothing Manipulation and hygiene: with modified independence;sitting/lateral leans;sit to/from stand;with adaptive equipment Additional ADL Goal #1: Patient will demonstrate ability to Independently state 3 energy conservation strategies for increased safety and independence with functional tasks.  OT Frequency: Min 1X/week    Co-evaluation PT/OT/SLP Co-Evaluation/Treatment: Yes Reason for Co-Treatment: Complexity of the patient's impairments (multi-system involvement) PT goals addressed during session: Mobility/safety with mobility;Balance OT goals addressed during session: ADL's and self-care      AM-PAC OT "6 Clicks" Daily Activity     Outcome Measure Help from another person eating meals?: None Help from another person taking care of personal grooming?: None Help from another person toileting, which includes using toliet, bedpan, or urinal?: A Little Help from another person bathing (including washing, rinsing, drying)?: A Little Help from another person to put on and taking off regular upper body clothing?: None Help from another person to put on and taking off regular lower body clothing?: A Little 6 Click Score: 21   End of Session Equipment Utilized During Treatment: Gait belt Nurse Communication: Mobility status  Activity Tolerance: Patient tolerated treatment well;Patient limited by pain;Patient limited by fatigue Patient left: in bed;with call bell/phone within reach;with family/visitor present (sitting EOB)  OT Visit Diagnosis: Unsteadiness on feet (R26.81);Other abnormalities of gait and mobility (R26.89);Other (comment);Pain (Decreased activity tolerance)                Time: 1610-9604 OT Time Calculation (min): 25 min Charges:  OT General Charges $OT Visit: 1 Visit OT Evaluation $OT Eval Low Complexity: 1  Low  Saahir Prude "Kyle" M., OTR/L, MA Acute Rehab [phone]   Lendon Colonel 01/24/2023, 1:01 PM

## 2023-01-24 NOTE — Plan of Care (Signed)

## 2023-01-24 NOTE — TOC Initial Note (Signed)
Transition of Care [facility]) - Initial/Assessment Note    Patient Details  Name: [name] MRN: [id number] Date of Birth: 11-11-1965  Transition of Care [facility]) CM/SW Contact:    Leone Haven, RN Phone Number: 01/24/2023, 3:03 PM  Clinical Narrative:                 From home with spouse, has PCP and insurance on file, states has no HH services in place at this time, per pt eval rec HHPT, HHOT, NCM offered choice, he states he has no preference,  NCM made referral to Cindie with [facility] , she is able to take referral for HHPT, HHOT.  Soc will begin 24 to 48 hrs post dc.  He has no DME at home. States mother at bedside will transport him home at [employer] and family is support system, states gets medications from [facility] on [address] and [place].  Pta self ambulatory .    Expected Discharge Plan: Home w Home Health Services Barriers to Discharge: No Barriers Identified   Patient Goals and CMS Choice Patient states their goals for this hospitalization and ongoing recovery are:: return home CMS Medicare.gov Compare Post Acute Care list provided to:: Patient Choice offered to / list presented to : Patient      Expected Discharge Plan and Services In-house Referral: NA Discharge Planning Services: CM Consult Post Acute Care Choice: Home Health Living arrangements for the past 2 months: Single Family Home Expected Discharge Date: 01/24/23               DME Arranged: N/A DME Agency: NA       HH Arranged: PT, OT HH Agency: [facility] Date [facility] Agency Contacted: 01/24/23 Time HH Agency Contacted: 1502 Representative spoke with at [facility] Agency: Cindie  Prior Living Arrangements/Services Living arrangements for the past 2 months: Single Family Home Lives with:: Spouse Patient language and need for interpreter reviewed:: Yes Do you feel safe going back to the place where you live?: Yes      Need for Family Participation in Patient Care: Yes (Comment) Care giver support  system in place?: Yes (comment)   Criminal Activity/Legal Involvement Pertinent to Current Situation/Hospitalization: No - Comment as needed  Activities of Daily Living   ADL Screening (condition at time of admission) Independently performs ADLs?: Yes (appropriate for developmental age) Is the patient deaf or have difficulty hearing?: No Does the patient have difficulty seeing, even when wearing glasses/contacts?: Yes (blurry vision left eye sometimes) Does the patient have difficulty concentrating, remembering, or making decisions?: No  Permission Sought/Granted Permission sought to share information with : Case Manager Permission granted to share information with : Yes, Verbal Permission Granted              Emotional Assessment Appearance:: Appears stated age Attitude/Demeanor/Rapport: Engaged Affect (typically observed): Appropriate Orientation: : Oriented to Self, Oriented to Place, Oriented to  Time, Oriented to Situation Alcohol / Substance Use: Not Applicable Psych Involvement: No (comment)  Admission diagnosis:  Acute pulmonary edema (HCC) [J81.0] Acute low back pain, unspecified back pain laterality, unspecified whether sciatica present [M54.50] Patient Active Problem List   Diagnosis Date Noted   Nausea and vomiting 12/02/2022   Prolonged QT interval 12/02/2022   Bacteremia due to Pseudomonas 12/01/2022   Leg wound, left 11/30/2022   Acute hypoxic respiratory failure (HCC) 05/22/2022   (HFpEF) heart failure with preserved ejection fraction (HCC) 05/22/2022   Acute pulmonary edema (HCC) 03/19/2022   Fluid overload  03/18/2022   Tinea pedis of both feet 11/22/2020   Enlarged heart 02/02/2020   ESRD (end stage renal disease) (HCC) 01/02/2020   Hypertensive urgency 10/25/2019   Chronic ulcer of right leg (HCC) 10/25/2019   AF (paroxysmal atrial fibrillation) (HCC) 10/25/2019   Bilateral lower extremity edema 10/06/2019   Eustachian tube dysfunction, right  10/06/2019   Chronic anticoagulation 12/11/2018   Elevated troponin 11/02/2018   Diabetic polyneuropathy associated with type 2 diabetes mellitus (HCC) 11/13/2017   Chronic kidney disease on chronic dialysis (HCC) 07/28/2017   Right sided Flank pain 07/27/2017   Weakness 07/27/2017   Myalgia 07/27/2017   Pneumonia of right lung due to infectious organism    Pleural effusion    HTN (hypertension)    Post-operative pain    DMII (diabetes mellitus, type 2) (HCC)    Acute on chronic diastolic heart failure (HCC)    Encephalopathy    SIRS (systemic inflammatory response syndrome) (HCC)    Chest tube in place    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HCC)    Postoperative pain    Pleural effusion on right 05/17/2017   Empyema (HCC) 05/16/2017   Chronic depression 09/20/2015   Recurrent major depressive disorder, in partial remission (HCC) 09/20/2015   Microalbuminuria 01/20/2015   Acute kidney injury (HCC) 12/28/2014   Type 2 diabetes mellitus with hyperglycemia, with long-term current use of insulin (HCC) 12/28/2014   DM (diabetes mellitus) type II controlled, neurological manifestation (HCC) 12/28/2014   Adjustment disorder with mixed anxiety and depressed mood 12/28/2014   Possible Panic disorder 12/28/2014   Hypertensive emergency without congestive heart failure 12/27/2014   Anxiety 12/23/2014   Class 2 obesity 05/14/2012   Sleep apnea 05/14/2012   Class 2 severe obesity due to excess calories with serious comorbidity and body mass index (BMI) of 38.0 to 38.9 in adult [facility]) 05/14/2012   Severe uncontrolled hypertension 03/28/2011   PCP:  Vladimir Crofts, FNP Pharmacy:   [facility] [address] [place] - [address] [place] [address] Phone: [phone] Fax: [phone]  [facility] [address] [place] [address] Phone: [phone] Fax: [phone]  [id number] - [place] Otto, [place] - [address] AT [facility] [address] [place] [address] Phone: [phone] Fax: [phone]     Social Determinants of Health (SDOH) Social History: SDOH Screenings   Food Insecurity: Food Insecurity Present (01/22/2023)  Housing: Low Risk  (01/22/2023)  Transportation Needs: No Transportation Needs (01/22/2023)  Utilities: Not At Risk (01/22/2023)  Social Connections: Unknown (08/22/2021)   Received from [facility]  Tobacco Use: Low Risk  (01/21/2023)   SDOH Interventions:     Readmission Risk Interventions    01/24/2023    3:01 PM  Readmission Risk Prevention Plan  Transportation Screening Complete  PCP or Specialist Appt within 3-5 Days Complete  HRI or Home Care Consult Complete  Palliative Care Screening Not Applicable  Medication Review (RN Care Manager) Complete

## 2023-01-24 NOTE — Progress Notes (Signed)
Physical Therapy Evaluation Patient Details Name: [name] MRN: [id number] DOB: 03-09-1966 Today's Date: 01/24/2023  History of Present Illness  57 y/o male presents to [facility] 10/14 with  worsening SOB, admitted with acute hypoxic respiratory failure and severe HTN. CT chest showed cardiomegaly w/ pulmonary edema, enlarged R lower paratracheal node, and stable chronic mediastinal lymph nodes. Pt also with R LE closed wound, L LE open wound, and chronic LBP. PMH: ESRD on HD MWF, CHF, DM II, HTN, OSA, A fib.   Clinical Impression  Pt seated at EOB upon arrival with wife present. Pt was agreeable to PT eval. Pt presents to therapy session close to functional mobility baseline with decreased balance, decreased functional mobility, and decreased endurance. Pt was able to ambulate with CGA ~120 ft w/ no AD, however, pt was slightly unsteady and had 3 LOB. Pt would occasionally reach for UE support and required 1 standing rest break due to fatigue and LBP. Pt would benefit from acute skilled PT to address functional impairments. Recommending post-acute HHPT to work towards independence with mobility. Acute PT to follow.          If plan is discharge home, recommend the following: A little help with walking and/or transfers;A little help with bathing/dressing/bathroom;Assist for transportation     Equipment Recommendations None recommended by PT     Functional Status Assessment Patient has had a recent decline in their functional status and demonstrates the ability to make significant improvements in function in a reasonable and predictable amount of time.     Precautions / Restrictions Precautions Precautions: Fall Restrictions Weight Bearing Restrictions: No      Mobility  Bed Mobility  General bed mobility comments:  (pt sitting EOB upon arrival)    Transfers Overall transfer level: Needs assistance Equipment used: None Transfers: Sit to/from Stand Sit to Stand: Contact guard  assist           General transfer comment: CGA for safety, slightly increased time due to stiffness and LBP    Ambulation/Gait Ambulation/Gait assistance: Contact guard assist Gait Distance (Feet): 120 Feet Assistive device: None Gait Pattern/deviations: Step-through pattern, Shuffle, Drifts right/left Gait velocity: dec     General Gait Details: pt tends to veer to the left and right when ambulating, pt is mildly unsteady with no AD with three LOB requiring CGA for safety. Pt was able to correct LOB with a step. Required standing rest break with 1UE support on rail due to fatigue and LBP        Balance Overall balance assessment: Needs assistance, Mild deficits observed, not formally tested Sitting-balance support: Feet supported, No upper extremity supported Sitting balance-Leahy Scale: Fair Sitting balance - Comments:  (pt tends to lean on one elbow when sitting due to LBP)   Standing balance support: No upper extremity supported Standing balance-Leahy Scale: Fair Standing balance comment: pt able to stand with CGA and no AD, LOB when ambulating         Pertinent Vitals/Pain Pain Assessment Pain Assessment: Faces Faces Pain Scale: Hurts whole lot Pain Location: R low back Pain Descriptors / Indicators: Aching, Discomfort Pain Intervention(s): Limited activity within patient's tolerance, Repositioned, Monitored during session    Home Living Family/patient expects to be discharged to:: Private residence Living Arrangements: Spouse/significant other Available Help at Discharge: Family;Available PRN/intermittently Type of Home: House Home Access: Level entry       Home Layout: One level Home Equipment: Pharmacist, hospital (2 wheels);Cane - single point;Grab bars - tub/shower;Hand held  shower head;Wheelchair - manual      Prior Function Prior Level of Function : Driving;Independent/Modified Independent      Mobility Comments: At night spouse helps pt  walk due to balance. pt states that occasionally he furniture walks around the house due to pain in the leg. Spouse assists pt getting out of the car. ADLs Comments: spouse was helping with showers and dressing. She was assisting with washing his body in the shower. Spouse assisted with donning pants and shoes.     Extremity/Trunk Assessment   Upper Extremity Assessment Upper Extremity Assessment: Defer to OT evaluation    Lower Extremity Assessment Lower Extremity Assessment: Overall WFL for tasks assessed (grossly 4+/5 on MMT, pt reports occasional shooting pain in B LE. Chronic R low back pain)    Cervical / Trunk Assessment Cervical / Trunk Assessment: Normal  Communication   Communication Communication: No apparent difficulties Cueing Techniques: Verbal cues  Cognition Arousal: Alert Behavior During Therapy: WFL for tasks assessed/performed Overall Cognitive Status: Within Functional Limits for tasks assessed         General Comments General comments (skin integrity, edema, etc.): VSS on RA, healed wound on R anterior shin, open wound on L anterior shin        PT Assessment Patient needs continued PT services  PT Problem List Decreased activity tolerance;Decreased balance;Decreased mobility       PT Treatment Interventions Gait training;DME instruction;Functional mobility training;Therapeutic activities;Therapeutic exercise;Balance training;Neuromuscular re-education;Patient/family education    PT Goals (Current goals can be found in the Care Plan section)  Acute Rehab PT Goals Patient Stated Goal: to go home PT Goal Formulation: With patient Time For Goal Achievement: 02/07/23 Potential to Achieve Goals: Good    Frequency Min 1X/week     Co-evaluation PT/OT/SLP Co-Evaluation/Treatment: Yes Reason for Co-Treatment: Complexity of the patient's impairments (multi-system involvement) PT goals addressed during session: Mobility/safety with mobility;Balance          AM-PAC PT "6 Clicks" Mobility  Outcome Measure Help needed turning from your back to your side while in a flat bed without using bedrails?: None Help needed moving from lying on your back to sitting on the side of a flat bed without using bedrails?: None Help needed moving to and from a bed to a chair (including a wheelchair)?: A Little Help needed standing up from a chair using your arms (e.g., wheelchair or bedside chair)?: A Little Help needed to walk in hospital room?: A Little Help needed climbing 3-5 steps with a railing? : A Lot 6 Click Score: 19    End of Session Equipment Utilized During Treatment: Gait belt Activity Tolerance: Patient tolerated treatment well Patient left: in bed;with call bell/phone within reach;with family/visitor present Nurse Communication: Mobility status PT Visit Diagnosis: Unsteadiness on feet (R26.81);Other abnormalities of gait and mobility (R26.89)    Time: 6045-4098 PT Time Calculation (min) (ACUTE ONLY): 25 min   Charges:   PT Evaluation $PT Eval Low Complexity: 1 Low   PT General Charges $$ ACUTE PT VISIT: 1 Visit         Hilton Cork, PT, DPT Secure Chat Preferred  Rehab Office [phone]   Arturo Morton Brion Aliment 01/24/2023, 11:20 AM

## 2023-01-24 NOTE — Progress Notes (Signed)
Out Patient Arrangements:  Pt is established @ [facility] on MWF.  Andree Elk [facility] [phone]

## 2023-01-24 NOTE — Discharge Instructions (Addendum)
You were hospitalized for acute hypoxic respiratory failure and back pain. Thank you for allowing [place] to be part of your care.   Please note these changes made to your medications:   *Please START taking:  -Tramadol 50 mg twice as needed for moderate pain  - Dilaudid 1 mg tablet every 4 hours as needed for severe pain  - Please monitor your symptoms while on these medications for intolerable side effects, given your kidney function   *Please STOP taking:  We recommend discontinuing your Tizanidine 4 mg due to it effecting your heart function We recommend discontinuing your oxycodone and hydrocodone-acetaminophen due to your inability to tolerate the medication, in the setting of your kidney function   Please make sure to follow with the following providers:   [facility] 02/18/2023 Dr. Ruthe Mannan at 8:00 AM, please call  [phone] to confirm appointment and address location   We recommend that you call and make an appointment with pain management provider within the next 1-2 weeks, to discuss medication adjustments given your kidney function and treating your pain symptoms  Please call and arrange a hospital follow-up appointment with your primary provider Nurse Daye. You were being worked up for ankylosing spondylosis while in the hospital. These labs will need to be followed and if indicated you make need to establish with a rheumatologist for medication treatment. If diagnosed with ankylosing spondylosis, we recommend a lymph node biopsy to rule out pulmonary infection or process before starting medications.

## 2023-01-24 NOTE — TOC Transition Note (Addendum)
Transition of Care [facility]) - CM/SW Discharge Note   Patient Details  Name: [name] MRN: [id number] Date of Birth: 1965/06/05  [facility] CM/SW Contact:  Leone Haven, RN Phone Number: 01/24/2023, 3:07 PM   Clinical Narrative:    Patient is for possible dc today, he has transport.  He is set up with [facility]. TOC to fill med.   Final next level of care: Home w Home Health Services Barriers to Discharge: No Barriers Identified   Patient Goals and CMS Choice CMS Medicare.gov Compare Post Acute Care list provided to:: Patient Choice offered to / list presented to : Patient  Discharge Placement                         Discharge Plan and Services Additional resources added to the After Visit Summary for   In-house Referral: NA Discharge Planning Services: CM Consult Post Acute Care Choice: Home Health          DME Arranged: N/A DME Agency: NA       HH Arranged: PT, OT HH Agency: [facility] Date [facility] Agency Contacted: 01/24/23 Time HH Agency Contacted: 1502 Representative spoke with at [facility] Agency: Cindie  Social Determinants of Health (SDOH) Interventions SDOH Screenings   Food Insecurity: Food Insecurity Present (01/22/2023)  Housing: Low Risk  (01/22/2023)  Transportation Needs: No Transportation Needs (01/22/2023)  Utilities: Not At Risk (01/22/2023)  Social Connections: Unknown (08/22/2021)   Received from [facility], [facility]  Tobacco Use: Low Risk  (01/21/2023)     Readmission Risk Interventions    01/24/2023    3:01 PM  Readmission Risk Prevention Plan  Transportation Screening Complete  PCP or Specialist Appt within 3-5 Days Complete  HRI or Home Care Consult Complete  Palliative Care Screening Not Applicable  Medication Review (RN Care Manager) Complete

## 2023-01-24 NOTE — Plan of Care (Signed)
Discharge instructions discussed with patient.  Patient instructed on home medications, restrictions, and follow up appointments. Belongings gathered and sent with patient.  Patients medications picked up fro [facility]   Patient discharged via wheelchair by this Clinical research associate.

## 2023-01-24 NOTE — Progress Notes (Signed)
Bipap not indicated at this time.   01/24/23 0001  BiPAP/CPAP/SIPAP  Reason BIPAP/CPAP not in use Other(comment) (prn order)

## 2023-01-28 LAB — HLA-B27 ANTIGEN: HLA-B27: NEGATIVE

## 2023-02-15 ENCOUNTER — Emergency Department (HOSPITAL_COMMUNITY): Payer: Medicare Other

## 2023-02-15 ENCOUNTER — Other Ambulatory Visit: Payer: Self-pay

## 2023-02-15 ENCOUNTER — Inpatient Hospital Stay (HOSPITAL_COMMUNITY)
Admission: EM | Admit: 2023-02-15 | Discharge: 2023-02-19 | DRG: 291 | Disposition: A | Discharge status: Home / Self Care | Payer: Medicare Other | Attending: Internal Medicine | Admitting: Internal Medicine

## 2023-02-15 DIAGNOSIS — Z8249 Family history of ischemic heart disease and other diseases of the circulatory system: Secondary | ICD-10-CM

## 2023-02-15 DIAGNOSIS — G4700 Insomnia, unspecified: Secondary | ICD-10-CM | POA: Diagnosis present

## 2023-02-15 DIAGNOSIS — J9601 Acute respiratory failure with hypoxia: Principal | ICD-10-CM | POA: Diagnosis present

## 2023-02-15 DIAGNOSIS — I10 Essential (primary) hypertension: Secondary | ICD-10-CM | POA: Diagnosis present

## 2023-02-15 DIAGNOSIS — E1122 Type 2 diabetes mellitus with diabetic chronic kidney disease: Secondary | ICD-10-CM | POA: Diagnosis present

## 2023-02-15 DIAGNOSIS — N2581 Secondary hyperparathyroidism of renal origin: Secondary | ICD-10-CM | POA: Diagnosis present

## 2023-02-15 DIAGNOSIS — G4733 Obstructive sleep apnea (adult) (pediatric): Secondary | ICD-10-CM | POA: Diagnosis present

## 2023-02-15 DIAGNOSIS — E873 Alkalosis: Secondary | ICD-10-CM | POA: Diagnosis present

## 2023-02-15 DIAGNOSIS — D631 Anemia in chronic kidney disease: Secondary | ICD-10-CM | POA: Diagnosis present

## 2023-02-15 DIAGNOSIS — J810 Acute pulmonary edema: Secondary | ICD-10-CM | POA: Diagnosis not present

## 2023-02-15 DIAGNOSIS — K5730 Diverticulosis of large intestine without perforation or abscess without bleeding: Secondary | ICD-10-CM | POA: Diagnosis present

## 2023-02-15 DIAGNOSIS — F419 Anxiety disorder, unspecified: Secondary | ICD-10-CM | POA: Diagnosis present

## 2023-02-15 DIAGNOSIS — I5032 Chronic diastolic (congestive) heart failure: Secondary | ICD-10-CM | POA: Diagnosis present

## 2023-02-15 DIAGNOSIS — E1140 Type 2 diabetes mellitus with diabetic neuropathy, unspecified: Secondary | ICD-10-CM | POA: Diagnosis present

## 2023-02-15 DIAGNOSIS — K589 Irritable bowel syndrome without diarrhea: Secondary | ICD-10-CM | POA: Diagnosis present

## 2023-02-15 DIAGNOSIS — E8770 Fluid overload, unspecified: Secondary | ICD-10-CM | POA: Diagnosis present

## 2023-02-15 DIAGNOSIS — R112 Nausea with vomiting, unspecified: Secondary | ICD-10-CM | POA: Diagnosis present

## 2023-02-15 DIAGNOSIS — Z992 Dependence on renal dialysis: Secondary | ICD-10-CM

## 2023-02-15 DIAGNOSIS — I132 Hypertensive heart and chronic kidney disease with heart failure and with stage 5 chronic kidney disease, or end stage renal disease: Secondary | ICD-10-CM | POA: Diagnosis not present

## 2023-02-15 DIAGNOSIS — Z794 Long term (current) use of insulin: Secondary | ICD-10-CM

## 2023-02-15 DIAGNOSIS — G8929 Other chronic pain: Secondary | ICD-10-CM | POA: Diagnosis present

## 2023-02-15 DIAGNOSIS — E0590 Thyrotoxicosis, unspecified without thyrotoxic crisis or storm: Secondary | ICD-10-CM | POA: Diagnosis present

## 2023-02-15 DIAGNOSIS — Z885 Allergy status to narcotic agent status: Secondary | ICD-10-CM

## 2023-02-15 DIAGNOSIS — Z79899 Other long term (current) drug therapy: Secondary | ICD-10-CM

## 2023-02-15 DIAGNOSIS — N186 End stage renal disease: Secondary | ICD-10-CM

## 2023-02-15 DIAGNOSIS — K219 Gastro-esophageal reflux disease without esophagitis: Secondary | ICD-10-CM | POA: Diagnosis present

## 2023-02-15 DIAGNOSIS — I5033 Acute on chronic diastolic (congestive) heart failure: Secondary | ICD-10-CM | POA: Diagnosis present

## 2023-02-15 LAB — I-STAT VENOUS BLOOD GAS, ED
Acid-Base Excess: 8 mmol/L — ABNORMAL HIGH (ref 0.0–2.0)
Bicarbonate: 31.3 mmol/L — ABNORMAL HIGH (ref 20.0–28.0)
Calcium, Ion: 1.07 mmol/L — ABNORMAL LOW (ref 1.15–1.40)
HCT: 31 % — ABNORMAL LOW (ref 39.0–52.0)
Hemoglobin: 10.5 g/dL — ABNORMAL LOW (ref 13.0–17.0)
O2 Saturation: 51 %
Potassium: 3.8 mmol/L (ref 3.5–5.1)
Sodium: 136 mmol/L (ref 135–145)
TCO2: 32 mmol/L (ref 22–32)
pCO2, Ven: 37.3 mm[Hg] — ABNORMAL LOW (ref 44–60)
pH, Ven: 7.532 — ABNORMAL HIGH (ref 7.25–7.43)
pO2, Ven: 24 mm[Hg] — CL (ref 32–45)

## 2023-02-15 LAB — COMPREHENSIVE METABOLIC PANEL
ALT: 11 U/L (ref 0–44)
AST: 11 U/L — ABNORMAL LOW (ref 15–41)
Albumin: 3.1 g/dL — ABNORMAL LOW (ref 3.5–5.0)
Alkaline Phosphatase: 67 U/L (ref 38–126)
Anion gap: 13 (ref 5–15)
BUN: 35 mg/dL — ABNORMAL HIGH (ref 6–20)
CO2: 27 mmol/L (ref 22–32)
Calcium: 9.3 mg/dL (ref 8.9–10.3)
Chloride: 95 mmol/L — ABNORMAL LOW (ref 98–111)
Creatinine, Ser: 8.93 mg/dL — ABNORMAL HIGH (ref 0.61–1.24)
GFR, Estimated: 6 mL/min — ABNORMAL LOW (ref >60)
Glucose, Bld: 136 mg/dL — ABNORMAL HIGH (ref 70–99)
Potassium: 3.8 mmol/L (ref 3.5–5.1)
Sodium: 135 mmol/L (ref 135–145)
Total Bilirubin: 0.9 mg/dL (ref <1.2)
Total Protein: 7.2 g/dL (ref 6.5–8.1)

## 2023-02-15 LAB — CBC WITH DIFFERENTIAL/PLATELET
Abs Immature Granulocytes: 0.03 10*3/uL (ref 0.00–0.07)
Basophils Absolute: 0 10*3/uL (ref 0.0–0.1)
Basophils Relative: 0 %
Eosinophils Absolute: 0 10*3/uL (ref 0.0–0.5)
Eosinophils Relative: 0 %
HCT: 31.6 % — ABNORMAL LOW (ref 39.0–52.0)
Hemoglobin: 10.3 g/dL — ABNORMAL LOW (ref 13.0–17.0)
Immature Granulocytes: 0 %
Lymphocytes Relative: 11 %
Lymphs Abs: 0.9 10*3/uL (ref 0.7–4.0)
MCH: 31 pg (ref 26.0–34.0)
MCHC: 32.6 g/dL (ref 30.0–36.0)
MCV: 95.2 fL (ref 80.0–100.0)
Monocytes Absolute: 0.5 10*3/uL (ref 0.1–1.0)
Monocytes Relative: 6 %
Neutro Abs: 6.7 10*3/uL (ref 1.7–7.7)
Neutrophils Relative %: 83 %
Platelets: 281 10*3/uL (ref 150–400)
RBC: 3.32 MIL/uL — ABNORMAL LOW (ref 4.22–5.81)
RDW: 13.7 % (ref 11.5–15.5)
WBC: 8.1 10*3/uL (ref 4.0–10.5)
nRBC: 0 % (ref 0.0–0.2)

## 2023-02-15 LAB — I-STAT CG4 LACTIC ACID, ED: Lactic Acid, Venous: 0.9 mmol/L (ref 0.5–1.9)

## 2023-02-15 NOTE — ED Triage Notes (Signed)
Patient arrived with EMS from home , missed 2 hemodialysis treatments last week and treated today , reports persistent SOB with productive cough and chest congestion for several days, denies fever or chills .

## 2023-02-15 NOTE — ED Provider Notes (Signed)
[place] EMERGENCY DEPARTMENT AT [facility] Provider Note   CSN: [id number] Arrival date & time: 02/15/23  2205     History {Add pertinent medical, surgical, social history, OB history to HPI:1} Chief Complaint  Patient presents with   Cough / Chest Congestion     Missed HD treatment x2    Bertis Hoshino is a 57 y.o. male.  The history is provided by the patient, the EMS personnel and medical records. No language interpreter was used.     57 year old male with multiple comorbidities which includes end-stage renal disease currently on hemodialysis, diabetes, hypertension, CHF brought here via EMS from home with complaints of shortness of breath.  Patient report for the past 2 days he has had increased nonproductive cough, chills, generalized fatigue, feeling bit constipated, nauseous and overall not feeling well.  He did miss his last dialysis 2 days ago but did finish a full set of dialysis today.  He denies any recent sick contact.  He still making urine and denies any urinary discomfort.  He normally does not wear oxygen but states that his O2 sats was in the 80s prior to arrival and EMS did place him on 2 L on nasal cannula.  Home Medications Prior to Admission medications   Medication Sig Start Date End Date Taking? Authorizing Provider  Accu-Chek Softclix Lancets lancets Use as directed.  For insulin-dependent type 2 diabetes Monitor  glucose 3 times a day Patient taking differently: 1 each by Other route See admin instructions. Use as directed.  For insulin-dependent type 2 diabetes Monitor  glucose 3 times a day 01/06/20   Albertine Grates, MD  acetaminophen (TYLENOL) 325 MG tablet Take 2 tablets (650 mg total) by mouth every 6 (six) hours. 12/03/22   Lockie Mola, MD  albuterol (PROVENTIL) (2.5 MG/3ML) 0.083% nebulizer solution Take 3 mLs (2.5 mg total) by nebulization every 6 (six) hours as needed for wheezing or shortness of breath. 09/25/22   Alfonse Spruce, MD   albuterol (VENTOLIN HFA) 108 (90 Base) MCG/ACT inhaler Inhale 2 puffs into the lungs every 6 (six) hours as needed for wheezing or shortness of breath. 09/25/22   Alfonse Spruce, MD  amLODipine (NORVASC) 10 MG tablet Take 1 tablet (10 mg total) by mouth daily. 05/24/22   Rocky Morel, DO  calcitRIOL (ROCALTROL) 0.25 MCG capsule Take 1 capsule (0.25 mcg total) by mouth every Monday, Wednesday, and Friday. 01/08/20   Albertine Grates, MD  camphor-menthol [facility]) lotion Apply topically 2 (two) times daily. Patient taking differently: Apply 1 Application topically 2 (two) times daily. 12/03/22   Lockie Mola, MD  carvedilol (COREG) 25 MG tablet Take 1 tablet (25 mg total) by mouth 2 (two) times daily with a meal. 05/24/22   Rocky Morel, DO  gabapentin (NEURONTIN) 300 MG capsule Take 1 capsule (300 mg total) by mouth daily. 01/25/23   Peterson Ao, MD  glucose blood test strip Use as instructed For insulin-dependent type 2 diabetes Monitor  glucose 3 times a day Patient taking differently: 1 each by Other route See admin instructions. Use as instructed For insulin-dependent type 2 diabetes Monitor  glucose 3 times a day 01/06/20   Albertine Grates, MD  HYDROmorphone (DILAUDID) 2 MG tablet Take 0.5 tablets (1 mg total) by mouth every 4 (four) hours as needed for severe pain (pain score 7-10). 01/24/23   Peterson Ao, MD  levocetirizine (XYZAL) 5 MG tablet Take 1 tablet (5 mg total) by mouth every evening. 09/25/22  Alfonse Spruce, MD  lidocaine (XYLOCAINE) 2 % jelly Apply 1 Application topically daily. 11/26/22   Alvira Monday, MD  multivitamin (RENA-VIT) TABS tablet Take 1 tablet by mouth at bedtime. 05/24/22   Rocky Morel, DO  Nutritional Supplements (,FEEDING SUPPLEMENT, PROSOURCE PLUS) liquid Take 30 mLs by mouth 2 (two) times daily between meals. 12/03/22   Lockie Mola, MD  sevelamer carbonate (RENVELA) 800 MG tablet Take 3 tablets (2,400 mg total) by mouth 3 (three) times daily with  meals. 12/03/22   Lockie Mola, MD  traMADol (ULTRAM) 50 MG tablet Take 1 tablet (50 mg total) by mouth every 12 (twelve) hours as needed for moderate pain (pain score 4-6). 01/24/23   Peterson Ao, MD  Vitamin D, Ergocalciferol, (DRISDOL) 1.25 MG (50000 UNIT) CAPS capsule Take 50,000 Units by mouth See admin instructions. Take one tablet by mouth on Monday, Wednesdays and Fridays per patient 08/17/19   [provider]  XPHOZAH 30 MG TABS Take 1 tablet by mouth 2 (two) times daily. 01/15/23   [provider]  insulin aspart protamine - aspart (NOVOLOG MIX 70/30 FLEXPEN) (70-30) 100 UNIT/ML FlexPen Inject 15-30 Units into the skin 2 (two) times daily.  05/24/22  [provider]      Allergies    Oxycodone    Review of Systems   Review of Systems  All other systems reviewed and are negative.   Physical Exam Updated Vital Signs BP (!) 172/89   Pulse 76   Resp (!) 26   SpO2 100%  Physical Exam Vitals and nursing note reviewed.  Constitutional:      General: He is not in acute distress.    Appearance: He is well-developed. He is ill-appearing.     Comments: Ill-appearing male, actively coughing appears uncomfortable.  HENT:     Head: Atraumatic.     Mouth/Throat:     Mouth: Mucous membranes are moist.  Eyes:     Conjunctiva/sclera: Conjunctivae normal.  Neck:     Comments: No JVD Cardiovascular:     Rate and Rhythm: Normal rate and regular rhythm.  Pulmonary:     Breath sounds: Rhonchi present. No wheezing or rales.  Abdominal:     Palpations: Abdomen is soft.     Tenderness: There is no abdominal tenderness.  Musculoskeletal:     Cervical back: Normal range of motion and neck supple.     Right lower leg: No edema.     Left lower leg: No edema.  Skin:    Findings: No rash.  Neurological:     Mental Status: He is alert. Mental status is at baseline.     ED Results / Procedures / Treatments   Labs (all labs ordered are listed, but only  abnormal results are displayed) Labs Reviewed - No data to display  EKG None  Radiology No results found.  Procedures Procedures  {Document cardiac monitor, telemetry assessment procedure when appropriate:1}  Medications Ordered in ED Medications - No data to display  ED Course/ Medical Decision Making/ A&P   {   Click here for ABCD2, HEART and other calculatorsREFRESH Note before signing :1}                              Medical Decision Making Amount and/or Complexity of Data Reviewed Labs: ordered. Radiology: ordered.    BP (!) 172/89   Pulse 76   Resp (!) 26   SpO2 100%  55:32 PM 57 year old male with multiple comorbidities which includes end-stage renal disease currently on hemodialysis, diabetes, hypertension, CHF brought here via EMS from home with complaints of shortness of breath.  Patient report for the past 2 days he has had increased nonproductive cough, chills, generalized fatigue, feeling bit constipated, nauseous and overall not feeling well.  He did miss his last dialysis 2 days ago but did finish a full set of dialysis today.  He denies any recent sick contact.  He still making urine and denies any urinary discomfort.  He normally does not wear oxygen but states that his O2 sats was in the 80s prior to arrival and EMS did place him on 2 L on nasal cannula.  Exam remarkable for an ill-appearing male actively coughing appears uncomfortable.  He is wearing supplemental oxygen.  Heart with normal rate and rhythm, lungs with occasional rhonchi but no wheezes rales abdomen is soft nontender no peripheral edema noted.  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  Labs remarkable for normal WBC, normal lactic acid.  K+ normal.  Vbg showing pH of 7.53. -The patient was maintained on a cardiac monitor.  I personally viewed and interpreted the cardiac monitored which showed an underlying rhythm of:  NSR -Imaging independently viewed and interpreted by me and I agree with radiologist's interpretation.  Result remarkable for *** -This patient presents to the ED for concern of sob, this involves an extensive number of treatment options, and is a complaint that carries with it a high risk of complications and morbidity.  The differential diagnosis includes CHF exacerbation, COPD, PNA, PTX, viral illness, MI -Co morbidities that complicate the patient evaluation includes ESRD, CHF, HTN, DM -Treatment includes biPAP, supplemental O2 -Reevaluation of the patient after these medicines showed that the patient {resolved/improved/worsened:23923::"improved"} -PCP office notes or outside notes reviewed -Discussion with specialist *** -Escalation to admission/observation considered: patients feels much better, is comfortable with discharge, and will follow up with PCP -Prescription medication considered, patient comfortable with *** -Social Determinant of Health considered which includes ***   {Document critical care time when appropriate:1} {Document review of labs and clinical decision tools ie heart score, Chads2Vasc2 etc:1}  {Document your independent review of radiology images, and any outside records:1} {Document your discussion with family members, caretakers, and with consultants:1} {Document social determinants of health affecting pt's care:1} {Document your decision making why or why not admission, treatments were needed:1} Final Clinical Impression(s) / ED Diagnoses Final diagnoses:  None    Rx / DC Orders ED Discharge Orders     None

## 2023-02-16 ENCOUNTER — Observation Stay (HOSPITAL_COMMUNITY): Payer: Medicare Other

## 2023-02-16 ENCOUNTER — Encounter (HOSPITAL_COMMUNITY): Payer: Self-pay | Admitting: Internal Medicine

## 2023-02-16 DIAGNOSIS — R0609 Other forms of dyspnea: Secondary | ICD-10-CM | POA: Diagnosis not present

## 2023-02-16 DIAGNOSIS — K5730 Diverticulosis of large intestine without perforation or abscess without bleeding: Secondary | ICD-10-CM | POA: Diagnosis present

## 2023-02-16 DIAGNOSIS — G4700 Insomnia, unspecified: Secondary | ICD-10-CM | POA: Diagnosis present

## 2023-02-16 DIAGNOSIS — J810 Acute pulmonary edema: Secondary | ICD-10-CM

## 2023-02-16 DIAGNOSIS — I120 Hypertensive chronic kidney disease with stage 5 chronic kidney disease or end stage renal disease: Secondary | ICD-10-CM | POA: Diagnosis not present

## 2023-02-16 DIAGNOSIS — F419 Anxiety disorder, unspecified: Secondary | ICD-10-CM | POA: Diagnosis present

## 2023-02-16 DIAGNOSIS — E873 Alkalosis: Secondary | ICD-10-CM | POA: Diagnosis present

## 2023-02-16 DIAGNOSIS — K589 Irritable bowel syndrome without diarrhea: Secondary | ICD-10-CM | POA: Diagnosis present

## 2023-02-16 DIAGNOSIS — I132 Hypertensive heart and chronic kidney disease with heart failure and with stage 5 chronic kidney disease, or end stage renal disease: Secondary | ICD-10-CM | POA: Diagnosis present

## 2023-02-16 DIAGNOSIS — Z8249 Family history of ischemic heart disease and other diseases of the circulatory system: Secondary | ICD-10-CM | POA: Diagnosis not present

## 2023-02-16 DIAGNOSIS — Z885 Allergy status to narcotic agent status: Secondary | ICD-10-CM | POA: Diagnosis not present

## 2023-02-16 DIAGNOSIS — K219 Gastro-esophageal reflux disease without esophagitis: Secondary | ICD-10-CM | POA: Diagnosis present

## 2023-02-16 DIAGNOSIS — E1140 Type 2 diabetes mellitus with diabetic neuropathy, unspecified: Secondary | ICD-10-CM | POA: Diagnosis present

## 2023-02-16 DIAGNOSIS — Z794 Long term (current) use of insulin: Secondary | ICD-10-CM | POA: Diagnosis not present

## 2023-02-16 DIAGNOSIS — E0590 Thyrotoxicosis, unspecified without thyrotoxic crisis or storm: Secondary | ICD-10-CM | POA: Diagnosis present

## 2023-02-16 DIAGNOSIS — N2581 Secondary hyperparathyroidism of renal origin: Secondary | ICD-10-CM | POA: Diagnosis present

## 2023-02-16 DIAGNOSIS — Z79899 Other long term (current) drug therapy: Secondary | ICD-10-CM | POA: Diagnosis not present

## 2023-02-16 DIAGNOSIS — D631 Anemia in chronic kidney disease: Secondary | ICD-10-CM | POA: Diagnosis present

## 2023-02-16 DIAGNOSIS — Z992 Dependence on renal dialysis: Secondary | ICD-10-CM | POA: Diagnosis not present

## 2023-02-16 DIAGNOSIS — N186 End stage renal disease: Secondary | ICD-10-CM

## 2023-02-16 DIAGNOSIS — E1122 Type 2 diabetes mellitus with diabetic chronic kidney disease: Secondary | ICD-10-CM | POA: Diagnosis present

## 2023-02-16 DIAGNOSIS — I5033 Acute on chronic diastolic (congestive) heart failure: Secondary | ICD-10-CM | POA: Diagnosis present

## 2023-02-16 DIAGNOSIS — J9601 Acute respiratory failure with hypoxia: Secondary | ICD-10-CM | POA: Diagnosis present

## 2023-02-16 DIAGNOSIS — G8929 Other chronic pain: Secondary | ICD-10-CM | POA: Diagnosis present

## 2023-02-16 DIAGNOSIS — G4733 Obstructive sleep apnea (adult) (pediatric): Secondary | ICD-10-CM | POA: Diagnosis present

## 2023-02-16 LAB — COMPREHENSIVE METABOLIC PANEL
ALT: 10 U/L (ref 0–44)
AST: 10 U/L — ABNORMAL LOW (ref 15–41)
Albumin: 2.9 g/dL — ABNORMAL LOW (ref 3.5–5.0)
Alkaline Phosphatase: 61 U/L (ref 38–126)
Anion gap: 12 (ref 5–15)
BUN: 37 mg/dL — ABNORMAL HIGH (ref 6–20)
CO2: 29 mmol/L (ref 22–32)
Calcium: 9.6 mg/dL (ref 8.9–10.3)
Chloride: 95 mmol/L — ABNORMAL LOW (ref 98–111)
Creatinine, Ser: 9.31 mg/dL — ABNORMAL HIGH (ref 0.61–1.24)
GFR, Estimated: 6 mL/min — ABNORMAL LOW (ref >60)
Glucose, Bld: 126 mg/dL — ABNORMAL HIGH (ref 70–99)
Potassium: 3.6 mmol/L (ref 3.5–5.1)
Sodium: 136 mmol/L (ref 135–145)
Total Bilirubin: 1 mg/dL (ref <1.2)
Total Protein: 6.7 g/dL (ref 6.5–8.1)

## 2023-02-16 LAB — URINALYSIS, W/ REFLEX TO CULTURE (INFECTION SUSPECTED)
Bacteria, UA: NONE SEEN
Bilirubin Urine: NEGATIVE
Glucose, UA: 150 mg/dL — AB
Hgb urine dipstick: NEGATIVE
Ketones, ur: NEGATIVE mg/dL
Leukocytes,Ua: NEGATIVE
Nitrite: NEGATIVE
Protein, ur: 100 mg/dL — AB
Specific Gravity, Urine: 1.009 (ref 1.005–1.030)
pH: 9 — ABNORMAL HIGH (ref 5.0–8.0)

## 2023-02-16 LAB — GLUCOSE, CAPILLARY: Glucose-Capillary: 90 mg/dL (ref 70–99)

## 2023-02-16 LAB — CBC
HCT: 30.8 % — ABNORMAL LOW (ref 39.0–52.0)
Hemoglobin: 9.8 g/dL — ABNORMAL LOW (ref 13.0–17.0)
MCH: 30.2 pg (ref 26.0–34.0)
MCHC: 31.8 g/dL (ref 30.0–36.0)
MCV: 94.8 fL (ref 80.0–100.0)
Platelets: 276 10*3/uL (ref 150–400)
RBC: 3.25 MIL/uL — ABNORMAL LOW (ref 4.22–5.81)
RDW: 13.7 % (ref 11.5–15.5)
WBC: 7.7 10*3/uL (ref 4.0–10.5)
nRBC: 0 % (ref 0.0–0.2)

## 2023-02-16 LAB — HEPATITIS B SURFACE ANTIGEN: Hepatitis B Surface Ag: NONREACTIVE

## 2023-02-16 LAB — LIPASE, BLOOD: Lipase: 81 U/L — ABNORMAL HIGH (ref 11–51)

## 2023-02-16 LAB — CBG MONITORING, ED: Glucose-Capillary: 117 mg/dL — ABNORMAL HIGH (ref 70–99)

## 2023-02-16 LAB — RESP PANEL BY RT-PCR (RSV, FLU A&B, COVID)  RVPGX2
Influenza A by PCR: NEGATIVE
Influenza B by PCR: NEGATIVE
Resp Syncytial Virus by PCR: NEGATIVE
SARS Coronavirus 2 by RT PCR: NEGATIVE

## 2023-02-16 LAB — BRAIN NATRIURETIC PEPTIDE: B Natriuretic Peptide: 1210.6 pg/mL — ABNORMAL HIGH (ref 0.0–100.0)

## 2023-02-16 LAB — PROTEIN / CREATININE RATIO, URINE
Creatinine, Urine: 78 mg/dL
Protein Creatinine Ratio: 1.94 mg/mg{creat} — ABNORMAL HIGH (ref 0.00–0.15)
Total Protein, Urine: 151 mg/dL

## 2023-02-16 LAB — MAGNESIUM: Magnesium: 2.6 mg/dL — ABNORMAL HIGH (ref 1.7–2.4)

## 2023-02-16 LAB — PHOSPHORUS: Phosphorus: 7.1 mg/dL — ABNORMAL HIGH (ref 2.5–4.6)

## 2023-02-16 MED ORDER — CALCITRIOL 0.25 MCG PO CAPS
0.2500 ug | ORAL_CAPSULE | ORAL | Status: DC
  Administered 2023-02-18: 0.25 ug via ORAL
  Filled 2023-02-16: qty 1

## 2023-02-16 MED ORDER — VITAMIN D (ERGOCALCIFEROL) 1.25 MG (50000 UNIT) PO CAPS
50000.0000 [IU] | ORAL_CAPSULE | ORAL | Status: DC
  Administered 2023-02-18: 50000 [IU] via ORAL
  Filled 2023-02-16: qty 1

## 2023-02-16 MED ORDER — ALBUTEROL SULFATE (2.5 MG/3ML) 0.083% IN NEBU
2.5000 mg | INHALATION_SOLUTION | Freq: Four times a day (QID) | RESPIRATORY_TRACT | Status: DC | PRN
  Administered 2023-02-16: 2.5 mg via RESPIRATORY_TRACT
  Filled 2023-02-16: qty 3

## 2023-02-16 MED ORDER — AMLODIPINE BESYLATE 10 MG PO TABS
10.0000 mg | ORAL_TABLET | Freq: Every day | ORAL | Status: DC
Start: 2023-02-16 — End: 2023-02-19
  Administered 2023-02-16 – 2023-02-19 (×3): 10 mg via ORAL
  Filled 2023-02-16: qty 2
  Filled 2023-02-16 (×2): qty 1

## 2023-02-16 MED ORDER — NITROGLYCERIN IN D5W 200-5 MCG/ML-% IV SOLN
0.0000 ug/min | INTRAVENOUS | Status: DC
  Administered 2023-02-16: 5 ug/min via INTRAVENOUS
  Filled 2023-02-16: qty 250

## 2023-02-16 MED ORDER — ONDANSETRON HCL 4 MG/2ML IJ SOLN
4.0000 mg | Freq: Once | INTRAMUSCULAR | Status: AC
  Administered 2023-02-16: 4 mg via INTRAVENOUS
  Filled 2023-02-16: qty 2

## 2023-02-16 MED ORDER — ACETAMINOPHEN 325 MG PO TABS
650.0000 mg | ORAL_TABLET | Freq: Four times a day (QID) | ORAL | Status: DC | PRN
  Administered 2023-02-16: 650 mg via ORAL
  Filled 2023-02-16 (×2): qty 2

## 2023-02-16 MED ORDER — HYDRALAZINE HCL 50 MG PO TABS
50.0000 mg | ORAL_TABLET | Freq: Three times a day (TID) | ORAL | Status: DC
  Administered 2023-02-16 – 2023-02-19 (×7): 50 mg via ORAL
  Filled 2023-02-16 (×7): qty 1

## 2023-02-16 MED ORDER — HEPARIN SODIUM (PORCINE) 5000 UNIT/ML IJ SOLN
5000.0000 [IU] | Freq: Three times a day (TID) | INTRAMUSCULAR | Status: DC
  Administered 2023-02-16 – 2023-02-19 (×9): 5000 [IU] via SUBCUTANEOUS
  Filled 2023-02-16 (×9): qty 1

## 2023-02-16 MED ORDER — HYDRALAZINE HCL 20 MG/ML IJ SOLN
5.0000 mg | Freq: Once | INTRAMUSCULAR | Status: AC
  Administered 2023-02-16: 5 mg via INTRAVENOUS
  Filled 2023-02-16: qty 1

## 2023-02-16 MED ORDER — CHLORHEXIDINE GLUCONATE CLOTH 2 % EX PADS
6.0000 | MEDICATED_PAD | Freq: Every day | CUTANEOUS | Status: DC
  Administered 2023-02-16 – 2023-02-19 (×4): 6 via TOPICAL

## 2023-02-16 MED ORDER — PROSOURCE PLUS PO LIQD: 30.0000 mL | Freq: Two times a day (BID) | ORAL | Status: DC

## 2023-02-16 MED ORDER — TRAMADOL HCL 50 MG PO TABS
50.0000 mg | ORAL_TABLET | Freq: Two times a day (BID) | ORAL | Status: DC | PRN
  Administered 2023-02-16: 50 mg via ORAL
  Filled 2023-02-16: qty 1

## 2023-02-16 MED ORDER — LIDOCAINE HCL URETHRAL/MUCOSAL 2 % EX GEL
1.0000 | Freq: Every day | CUTANEOUS | Status: DC
  Administered 2023-02-16: 1 via TOPICAL
  Filled 2023-02-16: qty 6
  Filled 2023-02-16: qty 11
  Filled 2023-02-16 (×2): qty 6

## 2023-02-16 MED ORDER — INSULIN ASPART 100 UNIT/ML IJ SOLN
0.0000 [IU] | Freq: Three times a day (TID) | INTRAMUSCULAR | Status: DC
  Administered 2023-02-17: 1 [IU] via SUBCUTANEOUS
  Administered 2023-02-17: 2 [IU] via SUBCUTANEOUS
  Administered 2023-02-18 – 2023-02-19 (×4): 1 [IU] via SUBCUTANEOUS

## 2023-02-16 MED ORDER — RENA-VITE PO TABS
1.0000 | ORAL_TABLET | Freq: Every day | ORAL | Status: DC
  Administered 2023-02-16 – 2023-02-18 (×3): 1 via ORAL
  Filled 2023-02-16 (×4): qty 1

## 2023-02-16 MED ORDER — FUROSEMIDE 10 MG/ML IJ SOLN
40.0000 mg | Freq: Once | INTRAMUSCULAR | Status: AC
  Administered 2023-02-16: 40 mg via INTRAVENOUS
  Filled 2023-02-16: qty 4

## 2023-02-16 MED ORDER — LORATADINE 10 MG PO TABS
10.0000 mg | ORAL_TABLET | Freq: Every evening | ORAL | Status: DC
  Administered 2023-02-16 – 2023-02-18 (×3): 10 mg via ORAL
  Filled 2023-02-16 (×3): qty 1

## 2023-02-16 MED ORDER — GABAPENTIN 300 MG PO CAPS
300.0000 mg | ORAL_CAPSULE | Freq: Every day | ORAL | Status: DC
  Administered 2023-02-17 – 2023-02-19 (×3): 300 mg via ORAL
  Filled 2023-02-16 (×3): qty 1

## 2023-02-16 MED ORDER — CARVEDILOL 12.5 MG PO TABS: 25.0000 mg | ORAL_TABLET | Freq: Two times a day (BID) | ORAL | Status: DC

## 2023-02-16 MED ORDER — TENAPANOR HCL (CKD) 30 MG PO TABS: 1.0000 | ORAL_TABLET | Freq: Two times a day (BID) | ORAL | Status: DC

## 2023-02-16 MED ORDER — SEVELAMER CARBONATE 800 MG PO TABS
2400.0000 mg | ORAL_TABLET | Freq: Three times a day (TID) | ORAL | Status: DC
  Administered 2023-02-17 – 2023-02-19 (×4): 2400 mg via ORAL
  Filled 2023-02-16 (×6): qty 3

## 2023-02-16 NOTE — ED Notes (Addendum)
Pt bp increased to 231/129. MD paged and DO Dixon made aware. Nitroglycerin titrated.

## 2023-02-16 NOTE — ED Notes (Signed)
To dialysis via stretcher.

## 2023-02-16 NOTE — ED Notes (Signed)
Ambulated patient and pt began sating at 92% on room air. Pt had to stop two times to catch breath. PA Laveda Norman notified

## 2023-02-16 NOTE — ED Notes (Signed)
ED TO INPATIENT HANDOFF REPORT  ED Nurse Name and Phone #:  Estefano Victory 11  S Name/Age/Gender [name] 57 y.o. male Room/Bed: 010C/010C  Code Status   Code Status: Full Code  Home/SNF/Other Home Patient oriented to: self, place, time, and situation Is this baseline? Yes   Triage Complete: Triage complete  Chief Complaint Acute hypoxic respiratory failure (HCC) [J96.01]  Triage Note Patient arrived with EMS from home , missed 2 hemodialysis treatments last week and treated today , reports persistent SOB with productive cough and chest congestion for several days, denies fever or chills .    Allergies Allergies  Allergen Reactions   Oxycodone Itching    Level of Care/Admitting Diagnosis ED Disposition     ED Disposition  Admit   Condition  --   Comment  Hospital [facility] [[id number]]  Level of Care: Progressive [102]  Admit to Progressive based on following criteria: RESPIRATORY PROBLEMS hypoxemic/hypercapnic respiratory failure that is responsive to NIPPV (BiPAP) or High Flow Nasal Cannula (6-80 lpm). Frequent assessment/intervention, no > Q2 hrs < Q4 hrs, to maintain oxygenation and pulmonary hygiene.  May place patient in observation at [facility] or [facility] if equivalent level of care is available:: No  Covid Evaluation: Confirmed COVID Negative  Diagnosis: Acute hypoxic respiratory failure [facility]) [[id number]]  Admitting Physician: Reymundo Poll [[id number]]  Attending Physician: Reymundo Poll [[id number]]          B Medical/Surgery History Past Medical History:  Diagnosis Date   Acute kidney injury (HCC) 12/28/2014   Adjustment disorder with mixed anxiety and depressed mood 12/28/2014   Asthma    CHF (congestive heart failure) (HCC)    CPAP (continuous positive airway pressure) dependence    Diabetes mellitus without complication (HCC)    DM (diabetes mellitus) type II controlled with renal manifestation (HCC) 12/28/2014    DM (diabetes mellitus) type II controlled, neurological manifestation (HCC) 12/28/2014   Encephalopathy, hypertensive 12/28/2014   GERD (gastroesophageal reflux disease)    Hypertension    Hypertensive emergency without congestive heart failure 12/27/2014   Pneumonia    Possible Panic disorder 12/28/2014   Renal disorder    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   Sleep apnea    Past Surgical History:  Procedure Laterality Date   AV FISTULA PLACEMENT Left 01/05/2020   Procedure: LEFT ARM BRACHIOCEPHALIC ARTERIOVENOUS (AV) FISTULA;  Surgeon: Chuck Hint, MD;  Location: [facility] OR;  Service: Vascular;  Laterality: Left;   EMPYEMA DRAINAGE Right 05/17/2017   Procedure: EMPYEMA DRAINAGE AND DECORTICATION;  Surgeon: Delight Ovens, MD;  Location: [facility] OR;  Service: Thoracic;  Laterality: Right;   FLEXIBLE BRONCHOSCOPY  05/17/2017   Procedure: FLEXIBLE BRONCHOSCOPY;  Surgeon: Delight Ovens, MD;  Location: [facility] OR;  Service: Thoracic;;   INSERTION OF DIALYSIS CATHETER Right 12/12/2021   Procedure: INSERTION OF RIGHT INTERNAL JUGULAR DIALYSIS CATHETER;  Surgeon: Chuck Hint, MD;  Location: [facility] OR;  Service: Vascular;  Laterality: Right;   IR FLUORO GUIDE CV LINE RIGHT  01/01/2020   IR THORACENTESIS ASP PLEURAL SPACE W/IMG GUIDE  05/17/2017   IR US GUIDE VASC ACCESS RIGHT  01/01/2020   NO PAST SURGERIES     REVISON OF ARTERIOVENOUS FISTULA Left 12/12/2021   Procedure: REVISION OF LEFT ARM FISTULA BY PLICATION;  Surgeon: Chuck Hint, MD;  Location: [facility] OR;  Service: Vascular;  Laterality: Left;   THORACOTOMY/LOBECTOMY Right 05/17/2017   Procedure: RIGHT MINI THORACOTOMY;  Surgeon: Delight Ovens, MD;  Location: [facility]  OR;  Service: Thoracic;  Laterality: Right;   VIDEO ASSISTED THORACOSCOPY (VATS)/EMPYEMA Right 05/17/2017   Procedure: RIGHT VIDEO ASSISTED THORACOSCOPY (VATS)/EMPYEMA;  Surgeon: Delight Ovens, MD;  Location: [facility] OR;  Service: Thoracic;  Laterality: Right;     A IV  Location/Drains/Wounds Patient Lines/Drains/Airways Status     Active Line/Drains/Airways     Name Placement date Placement time Site Days   Peripheral IV 02/16/23 20 G Right Antecubital 02/16/23  0046  Antecubital  less than 1   Fistula / Graft Left Upper arm Arteriovenous fistula 12/12/21  0941  Upper arm  431   Y Chest Tube 1 and 2 05/17/17  1640  -- 2101   Wound / Incision (Open or Dehisced) 10/26/22 Other (Comment) Pretibial Distal;Left Anterior Lower leg/ Shin 10/26/22  0021  Pretibial  113            Intake/Output Last 24 hours  Intake/Output Summary (Last 24 hours) at 02/16/2023 1512 Last data filed at 02/16/2023 1452 Gross per 24 hour  Intake 98.82 ml  Output 3600 ml  Net -3501.18 ml    Labs/Imaging Results for orders placed or performed during the hospital encounter of 02/15/23 (from the past 48 hour(s))  Comprehensive metabolic panel     Status: Abnormal   Collection Time: 02/15/23 10:42 PM  Result Value Ref Range   Sodium 135 135 - 145 mmol/L   Potassium 3.8 3.5 - 5.1 mmol/L   Chloride 95 (L) 98 - 111 mmol/L   CO2 27 22 - 32 mmol/L   Glucose, Bld 136 (H) 70 - 99 mg/dL    Comment: Glucose reference range applies only to samples taken after fasting for at least 8 hours.   BUN 35 (H) 6 - 20 mg/dL   Creatinine, Ser 1.61 (H) 0.61 - 1.24 mg/dL   Calcium 9.3 8.9 - 09.6 mg/dL   Total Protein 7.2 6.5 - 8.1 g/dL   Albumin 3.1 (L) 3.5 - 5.0 g/dL   AST 11 (L) 15 - 41 U/L   ALT 11 0 - 44 U/L   Alkaline Phosphatase 67 38 - 126 U/L   Total Bilirubin 0.9 <1.2 mg/dL   GFR, Estimated 6 (L) >60 mL/min    Comment: (NOTE) Calculated using the CKD-EPI Creatinine Equation (2021)    Anion gap 13 5 - 15    Comment: Performed at [facility], [address] [place], [place] [address]  CBC with Differential     Status: Abnormal   Collection Time: 02/15/23 10:42 PM  Result Value Ref Range   WBC 8.1 4.0 - 10.5 K/uL   RBC 3.32 (L) 4.22 - 5.81 MIL/uL   Hemoglobin 10.3 (L)  13.0 - 17.0 g/dL   HCT 98.1 (L) 19.1 - 47.8 %   MCV 95.2 80.0 - 100.0 fL   MCH 31.0 26.0 - 34.0 pg   MCHC 32.6 30.0 - 36.0 g/dL   RDW 29.5 62.1 - 30.8 %   Platelets 281 150 - 400 K/uL   nRBC 0.0 0.0 - 0.2 %   Neutrophils Relative % 83 %   Neutro Abs 6.7 1.7 - 7.7 K/uL   Lymphocytes Relative 11 %   Lymphs Abs 0.9 0.7 - 4.0 K/uL   Monocytes Relative 6 %   Monocytes Absolute 0.5 0.1 - 1.0 K/uL   Eosinophils Relative 0 %   Eosinophils Absolute 0.0 0.0 - 0.5 K/uL   Basophils Relative 0 %   Basophils Absolute 0.0 0.0 - 0.1 K/uL  Immature Granulocytes 0 %   Abs Immature Granulocytes 0.03 0.00 - 0.07 K/uL    Comment: Performed at [facility], [address], [place], [place] [address]  Lipase, blood     Status: Abnormal   Collection Time: 02/15/23 10:42 PM  Result Value Ref Range   Lipase 81 (H) 11 - 51 U/L    Comment: Performed at [facility], [address], [place], [place] [address]  Hepatitis B surface antigen     Status: None   Collection Time: 02/15/23 10:42 PM  Result Value Ref Range   Hepatitis B Surface Ag NON REACTIVE NON REACTIVE    Comment: Performed at [facility], [address] [place], [place] [address]  I-Stat Lactic Acid, ED     Status: None   Collection Time: 02/15/23 10:50 PM  Result Value Ref Range   Lactic Acid, Venous 0.9 0.5 - 1.9 mmol/L  I-Stat venous blood gas, ([facility] ED, [facility], [facility])     Status: Abnormal   Collection Time: 02/15/23 10:50 PM  Result Value Ref Range   pH, Ven 7.532 (H) 7.25 - 7.43   pCO2, Ven 37.3 (L) 44 - 60 mmHg   pO2, Ven 24 (LL) 32 - 45 mmHg   Bicarbonate 31.3 (H) 20.0 - 28.0 mmol/L   TCO2 32 22 - 32 mmol/L   O2 Saturation 51 %   Acid-Base Excess 8.0 (H) 0.0 - 2.0 mmol/L   Sodium 136 135 - 145 mmol/L   Potassium 3.8 3.5 - 5.1 mmol/L   Calcium, Ion 1.07 (L) 1.15 - 1.40 mmol/L   HCT 31.0 (L) 39.0 - 52.0 %   Hemoglobin 10.5 (L) 13.0 - 17.0 g/dL   Sample type VENOUS    Comment NOTIFIED PHYSICIAN   Resp panel by  RT-PCR (RSV, Flu A&B, Covid) Anterior Nasal Swab     Status: None   Collection Time: 02/15/23 11:47 PM   Specimen: Anterior Nasal Swab  Result Value Ref Range   SARS Coronavirus 2 by RT PCR NEGATIVE NEGATIVE   Influenza A by PCR NEGATIVE NEGATIVE   Influenza B by PCR NEGATIVE NEGATIVE    Comment: (NOTE) The Xpert Xpress SARS-CoV-2/FLU/RSV plus assay is intended as an aid in the diagnosis of influenza from Nasopharyngeal swab specimens and should not be used as a sole basis for treatment. Nasal washings and aspirates are unacceptable for Xpert Xpress SARS-CoV-2/FLU/RSV testing.  Fact Sheet for Patients: BloggerCourse.com  Fact Sheet for Healthcare Providers: SeriousBroker.it  This test is not yet approved or cleared by the [place] FDA and has been authorized for detection and/or diagnosis of SARS-CoV-2 by FDA under an Emergency Use Authorization (EUA). This EUA will remain in effect (meaning this test can be used) for the duration of the COVID-19 declaration under Section 564(b)(1) of the Act, 21 U.S.C. section 360bbb-3(b)(1), unless the authorization is terminated or revoked.     Resp Syncytial Virus by PCR NEGATIVE NEGATIVE    Comment: (NOTE) Fact Sheet for Patients: BloggerCourse.com  Fact Sheet for Healthcare Providers: SeriousBroker.it  This test is not yet approved or cleared by the [place] FDA and has been authorized for detection and/or diagnosis of SARS-CoV-2 by FDA under an Emergency Use Authorization (EUA). This EUA will remain in effect (meaning this test can be used) for the duration of the COVID-19 declaration under Section 564(b)(1) of the Act, 21 U.S.C. section 360bbb-3(b)(1), unless the authorization is terminated or revoked.  Performed at [facility],  [address] [place], [place] [address]   Urinalysis, w/ Reflex to Culture  (Infection Suspected) -Urine, Clean Catch     Status: Abnormal   Collection Time: 02/16/23 12:37 AM  Result Value Ref Range   Specimen Source URINE, CLEAN CATCH    Color, Urine YELLOW YELLOW   APPearance CLEAR CLEAR   Specific Gravity, Urine 1.009 1.005 - 1.030   pH 9.0 (H) 5.0 - 8.0   Glucose, UA 150 (A) NEGATIVE mg/dL   Hgb urine dipstick NEGATIVE NEGATIVE   Bilirubin Urine NEGATIVE NEGATIVE   Ketones, ur NEGATIVE NEGATIVE mg/dL   Protein, ur 573 (A) NEGATIVE mg/dL   Nitrite NEGATIVE NEGATIVE   Leukocytes,Ua NEGATIVE NEGATIVE   RBC / HPF 0-5 0 - 5 RBC/hpf   WBC, UA 0-5 0 - 5 WBC/hpf    Comment:        Reflex urine culture not performed if WBC <=10, OR if Squamous epithelial cells >5. If Squamous epithelial cells >5 suggest recollection.    Bacteria, UA NONE SEEN NONE SEEN   Squamous Epithelial / HPF 0-5 0 - 5 /HPF    Comment: Performed at [facility], [address], [place], [place] [address]  Protein / creatinine ratio, urine     Status: Abnormal   Collection Time: 02/16/23 12:37 AM  Result Value Ref Range   Creatinine, Urine 78 mg/dL   Total Protein, Urine 151 mg/dL    Comment: RESULT CONFIRMED BY MANUAL DILUTION NO NORMAL RANGE ESTABLISHED FOR THIS TEST    Protein Creatinine Ratio 1.94 (H) 0.00 - 0.15 mg/mg[Cre]    Comment: Performed at [facility], [address] [place], [place] [address]  Brain natriuretic peptide     Status: Abnormal   Collection Time: 02/16/23  3:29 AM  Result Value Ref Range   B Natriuretic Peptide 1,210.6 (H) 0.0 - 100.0 pg/mL    Comment: Performed at [facility], [address], [place], [place] [address]  CBC     Status: Abnormal   Collection Time: 02/16/23  3:29 AM  Result Value Ref Range   WBC 7.7 4.0 - 10.5 K/uL   RBC 3.25 (L) 4.22 - 5.81 MIL/uL   Hemoglobin 9.8 (L) 13.0 - 17.0 g/dL   HCT 83.1 (L) 51.7 - 61.6 %   MCV 94.8 80.0 - 100.0 fL   MCH 30.2 26.0 - 34.0 pg   MCHC 31.8 30.0 - 36.0 g/dL   RDW 07.3 71.0 -  62.6 %   Platelets 276 150 - 400 K/uL   nRBC 0.0 0.0 - 0.2 %    Comment: Performed at [facility], [address], [place], [place] [address]  Comprehensive metabolic panel     Status: Abnormal   Collection Time: 02/16/23  3:29 AM  Result Value Ref Range   Sodium 136 135 - 145 mmol/L   Potassium 3.6 3.5 - 5.1 mmol/L   Chloride 95 (L) 98 - 111 mmol/L   CO2 29 22 - 32 mmol/L   Glucose, Bld 126 (H) 70 - 99 mg/dL    Comment: Glucose reference range applies only to samples taken after fasting for at least 8 hours.   BUN 37 (H) 6 - 20 mg/dL   Creatinine, Ser 6.27 (H) 0.61 - 1.24 mg/dL   Calcium 9.6 8.9 - 03.5 mg/dL   Total Protein 6.7 6.5 - 8.1 g/dL   Albumin 2.9 (L) 3.5 - 5.0 g/dL   AST 10 (L) 15 -  41 U/L   ALT 10 0 - 44 U/L   Alkaline Phosphatase 61 38 - 126 U/L   Total Bilirubin 1.0 <1.2 mg/dL   GFR, Estimated 6 (L) >60 mL/min    Comment: (NOTE) Calculated using the CKD-EPI Creatinine Equation (2021)    Anion gap 12 5 - 15    Comment: Performed at [facility], [address] [place], [place] [address]  Phosphorus     Status: Abnormal   Collection Time: 02/16/23  3:29 AM  Result Value Ref Range   Phosphorus 7.1 (H) 2.5 - 4.6 mg/dL    Comment: Performed at [facility], [address] [place], [place] [address]  Magnesium     Status: Abnormal   Collection Time: 02/16/23  3:29 AM  Result Value Ref Range   Magnesium 2.6 (H) 1.7 - 2.4 mg/dL    Comment: Performed at [facility], [address] [place], [place] [address]  CBG monitoring, ED     Status: Abnormal   Collection Time: 02/16/23  7:59 AM  Result Value Ref Range   Glucose-Capillary 117 (H) 70 - 99 mg/dL    Comment: Glucose reference range applies only to samples taken after fasting for at least 8 hours.   CT ABDOMEN PELVIS WO CONTRAST  Result Date: 02/16/2023 CLINICAL DATA:  Acute abdominal pain. EXAM: CT ABDOMEN AND PELVIS WITHOUT CONTRAST TECHNIQUE: Multidetector CT imaging of the abdomen  and pelvis was performed following the standard protocol without IV contrast. RADIATION DOSE REDUCTION: This exam was performed according to the departmental dose-optimization program which includes automated exposure control, adjustment of the mA and/or kV according to patient size and/or use of iterative reconstruction technique. COMPARISON:  05/30/2022. FINDINGS: Lower chest: Trace bilateral pleural effusions identified interlobular septal thickening and ground-glass attenuation noted lower lung zones. Subsegmental atelectasis within the periphery of the lung bases Hepatobiliary: No focal liver abnormality. There is a suggestion of mild gallbladder wall thickening. No pericholecystic fluid. No gallstones identified. No bile duct dilatation. Pancreas: Unremarkable. No pancreatic ductal dilatation or surrounding inflammatory changes. Spleen: Normal in size without focal abnormality. Adrenals/Urinary Tract: Normal adrenal glands. Punctate stone scratch set several punctate stones noted within the upper pole of left kidney and inferior pole of right kidney. Bilateral renal cortical volume loss, increased from previous exam. Several simple appearing right kidney cysts are noted. The largest arises off the inferior pole of the right kidney measuring 1.5 cm, image 43/3. No follow-up imaging recommended. No signs of hydronephrosis bilaterally. No ureteral calculi. Bladder appears normal. Stomach/Bowel: Stomach is normal. Appendix is normal. No pathologic dilatation of the large or small bowel loops. Scattered colonic diverticula without signs of acute diverticulitis. No bowel wall thickening or inflammatory change. Vascular/Lymphatic: Aortic atherosclerosis. No aneurysm. No signs of abdominopelvic adenopathy. Reproductive: Prostate is unremarkable. Other: No free fluid or fluid collections. No signs of pneumoperitoneum. Small broad-based fat containing umbilical hernia. Musculoskeletal: No acute or significant osseous  findings. IMPRESSION: 1. No acute findings within the abdomen or pelvis. 2. Trace bilateral pleural effusions with interlobular septal thickening and ground-glass attenuation noted lower lung zones. Findings are favored to represent mild pulmonary edema. 3. Suggestion of mild gallbladder wall thickening. No gallstones identified. If there is clinical concern for acute cholecystitis consider further evaluation with right upper quadrant ultrasound. 4. Bilateral, punctate nonobstructing renal calculi. 5. Colonic diverticulosis without signs of acute diverticulitis. 6. Small broad-based fat containing umbilical hernia. 7.  Aortic Atherosclerosis (ICD10-I70.0). Electronically Signed  By: Signa Kell M.D.   On: 02/16/2023 09:30   DG Abd 1 View  Result Date: 02/16/2023 CLINICAL DATA:  Nausea. EXAM: ABDOMEN - 1 VIEW COMPARISON:  None Available. FINDINGS: Lung bases show patchy airspace disease in enlargement of the cardiopericardial silhouette. No gaseous bowel dilatation visualized in the abdomen or pelvis. Visualized bony anatomy unremarkable. IMPRESSION: 1. No gaseous bowel dilatation. 2. Patchy bibasilar airspace disease. Electronically Signed   By: Kennith Center M.D.   On: 02/16/2023 08:17   DG Chest 2 View  Result Date: 02/16/2023 CLINICAL DATA:  Shortness of breath. EXAM: CHEST - 2 VIEW COMPARISON:  01/21/2023 FINDINGS: Cardiomegaly, equivocal E worsened. Small right pleural effusion. Small amount of fluid in the right minor fissure. Worsening pulmonary edema. No pneumothorax. IMPRESSION: 1. Worsening pulmonary edema and small right pleural effusion. 2. Cardiomegaly, equivocally worsened. Electronically Signed   By: Narda Rutherford M.D.   On: 02/16/2023 00:02    Pending Labs Unresulted Labs (From admission, onward)     Start     Ordered   02/17/23 0500  Renal function panel  Tomorrow morning,   R        02/16/23 1218   02/17/23 0500  CBC  Tomorrow morning,   R        02/16/23 1218   02/16/23  0845  Hepatitis B surface antibody,quantitative  (New Admission Hemo Labs (Hepatitis B))  Once,   R        02/16/23 0846   Signed and Held  Renal function panel  Once,   R        Signed and Held   Signed and Held  CBC  Once,   R        Signed and Held            Vitals/Pain Today's Vitals   02/16/23 1400 02/16/23 1430 02/16/23 1451 02/16/23 1452  BP: (!) 159/81 138/73  (!) 146/76  Pulse: 68 68  70  Resp: 18 13  16   Temp:    98 F (36.7 C)  TempSrc:    Oral  SpO2: 100% 100%  100%  Weight:   202 lb 13.2 oz (92 kg)   Height:      PainSc:   0-No pain     Isolation Precautions No active isolations  Medications Medications  heparin injection 5,000 Units (5,000 Units Subcutaneous Given 02/16/23 0631)  insulin aspart (novoLOG) injection 0-6 Units ( Subcutaneous Not Given 02/16/23 0810)  acetaminophen (TYLENOL) tablet 650 mg (650 mg Oral Given 02/16/23 0800)  albuterol (PROVENTIL) (2.5 MG/3ML) 0.083% nebulizer solution 2.5 mg (2.5 mg Nebulization Given 02/16/23 0801)  amLODipine (NORVASC) tablet 10 mg (10 mg Oral Given 02/16/23 0800)  calcitRIOL (ROCALTROL) capsule 0.25 mcg (has no administration in time range)  gabapentin (NEURONTIN) capsule 300 mg (has no administration in time range)  multivitamin (RENA-VIT) tablet 1 tablet (has no administration in time range)  sevelamer carbonate (RENVELA) tablet 2,400 mg (has no administration in time range)  Vitamin D (Ergocalciferol) (DRISDOL) 1.25 MG (50000 UNIT) capsule 50,000 Units (has no administration in time range)  Tenapanor HCl (CKD) TABS 1 tablet (1 tablet Oral Not Given 02/16/23 0620)  loratadine (CLARITIN) tablet 10 mg (has no administration in time range)  lidocaine (XYLOCAINE) 2 % jelly 1 Application (1 Application Topical Given 02/16/23 0801)  traMADol (ULTRAM) tablet 50 mg (50 mg Oral Given 02/16/23 0800)  Chlorhexidine Gluconate Cloth 2 % PADS 6 each (has no administration in time range)  furosemide (LASIX) injection 40 mg (40  mg Intravenous Given 02/16/23 0135)  ondansetron (ZOFRAN) injection 4 mg (4 mg Intravenous Given 02/16/23 0200)  hydrALAZINE (APRESOLINE) injection 5 mg (5 mg Intravenous Given 02/16/23 0802)    Mobility walks     Focused Assessments     R Recommendations: See Admitting Provider Note  Report given to:   Additional Notes:

## 2023-02-16 NOTE — ED Notes (Signed)
Pharmacy stated they do not have Tenapanor medication, pt will have to bring from home. Patient is aware and will have someone bring tis medication.

## 2023-02-16 NOTE — Hospital Course (Addendum)
  Severe symptomatic hypertension Hypertensive nephropathy Patient has a long standing history of HTN that is controlled with Amlodipine and Coreg. During this admission his blood pressures has been in 170s systolics. Hydralazine was added to his home medications to further control his blood pressure.     ESRD on HD MWF Anemia of CKD Secondary hyperparathyroidism Patient was found to be volume overloaded during this admission. Had multiple HD sessions with significant improvement in his volume status.Patient was continued on his home Vitamin D and Renvela   HFpEF Patient presented with SOB, found to have acute hypoxic respiratory failure in the setting of heart failure exacerbation likely due to missing HD sessions. An ECHO done on 02/17/2023 showed  EF of 60-65%,severe left ventricular hypertrophy,Grade 1 diastolic dysfunction and aortic dilatation of about 39mm. IVC was normal. Patient was restarted on his home Coreg. His breathing significantly improved after multiple sessions of dialysis.   Chronic pain  Continued on home tramadol   IBS constipation Continued on home Tenapanor   Neuropathy Continued on home Gabapentin   Chronic mediastinal lymphadenopathy  Has pulmonology  f/u Monday at [facility].   ESRD on MWF HD Did miss 1 HD session this week but tolerated full session Friday. Estimated dry weigh listed as 97 kg on HD note from 11/08; post-HD weight listed as 101.10 kg. -Alert nephrology of HD needs if still hospitalized through the weekend -Continue home renvela, calcitriol, xphoza, renavit  T2DM -CBG checks per unit protocol, goal 140-180 -SSI  HTN -Continue home amlodipine, carvedilol  1.  Follow-up:   Severe symptomatic hypertension Hypertensive nephropathy - Follow on his BP medication adherence - Assess his BP and adjust medications/ doses as needed     ESRD on HD MWF Anemia of CKD Secondary hyperparathyroidism -      * -     * -         2.   Labs / imaging needed at time of follow-up: {Labs:13245}   3.  Pending labs/ test needing follow-up: ***    Dear [name],  It was a pleasure taking care of you at [facility]. You were admitted for shortness of breath and treated for acute hypoxic respiratory failure due heart failure exacerbation. We are discharging you home now that you are doing better after multiple dialysis sessions. Please follow the following instructions.  1) You have a history of End stage renal failure for which you dialyze every Monday, wednesday and Friday.It is very important that you stick to this schedule.   2) You have a history of hypertension that hasn't bleed controlled. Uncontrolled hypertension can cause severe damage to your blood vessels and organs. We are adding a medication called Hydralazine to your existing blood pressure medications. Please take Hydralazine 50 mg  three times daily.   3) For your heart failure,we did an ultrasound of your heart and it showed your heart is pumping well but heart chamber are severely thickened which is affected how your heart fills. Ensuring that your high blood pressure is controlled by taking your medications and not missing your dialysis sections will go a long way to help decrease further damage to your heart.   4) You were scheduled with [facility] pulmonology on 11/11 for your  mediastinal lymphadenopathy follow up. You couldn't make that appointment because you were in the hospital. PLEASE try to reschedule this appointment.It is very important that you do!  Take care,  Dr. Kathleen Lime, MD

## 2023-02-16 NOTE — H&P (Addendum)
Date: 02/16/2023               Patient Name:  [name] MRN: [id number]  DOB: 12-29-1965 Age / Sex: 57 y.o., male   PCP: Vladimir Crofts, FNP         Medical Service: Internal Medicine Teaching Service         Attending Physician: Dr. Reymundo Poll, MD      First Contact: Dr. Kathleen Lime, MD Pager [phone]    Second Contact: Dr. Morene Crocker, MD Pager [phone]         After Hours (After 5p/  First Contact Pager: [phone]  weekends / holidays): Second Contact Pager: [phone]   SUBJECTIVE   Chief Complaint: shortness of breath  History of Present Illness: [name] is a 57 yo with a PMH of ESRD on HD, T2DM, HTN, asthma, and HFpEF who presented with shortness of breath for the past two days.  He notes that he has been increasingly short of breath, especially on exertion, and can no longer walk as far as he used to be able to.  He used to be able to walk about 8 blocks but for the past month or so says he cannot walk more than 2 blocks without turning around due to his shortness of breath. He has baseline orthopnea but feels it is progressing.  He denies any new swelling but notes his legs sometimes feel heavier.  He also reports a progressive nonproductive cough. He states he has been nauseous and has vomited 3 times a day for about a week.  No hematemesis.  He states he has had chills and has had some constipation recently. He denies any recent fever, illnesses, or diarrhea.  He says that for the past few days he has also had significant difficulty sleeping and reports only sleeping about 10 minutes each night.  He describes a sensation of tingling throughout his body that bothers him and prevents him from falling back asleep. He describes it as "itching on the inside of the skin." He especially notices a sensation as a night.  He also notes numbness in his feet, which he says he had previously but went away, as well as some muscle twitching.  He denies any cramping.   He associates these sensations with recent medications including oxycodone and hydrocodone, which he stopped because he started to "feel funny" 2-3 weeks ago.   Of note, he missed HD on Wednesday but completed a full dialysis session today.   ED Course: Arrived by EMS on 2L [place], sats reported in the 80s prior to arrival. Tachypneic and hypertensive. Venous blood gas showed respiratory alkalosis, Bipap was started. CXR showed pulmonary edema. Electrolytes wnl. EKG showed sinus rhythm. Given 40 mg IV Lasix, Nitro.  Meds:   PROSource Plus liquid 30 mLs BID between meals  Acetaminophen 650 mg q6h  albuterol (2.5 MG/3ML) 0.083% nebulizer solution q6h PRN wheezing or shortness of breath.  albuterol 108 (90 Base) MCG/ACT inhaler 2 puffs q6h PRN wheezing or shortness of breath.  amLODipine 10 MG tablet daily   calcitRIOL 0.25 MCG capsule every Monday, Wednesday, and Friday.  camphor-menthol lotion topically 2 (two) times daily.  carvedilol 25 MG tablet BID  gabapentin 300 MG capsule daily  HYDROmorphone 1 mg q4h PRN severe pain  levocetirizine 5 MG tablet daily  lidocaine 2 % jelly 1 application topically daily.  multivitamin 1 tablet daily  sevelamer carbonate 2,400 mg TID   traMADol 50 MG tablet q12h PRN  moderate pain   Vitamin D (Ergocalciferol) 1.25 MG (50000 UNIT) capsule on MWF  Xphozah 30 MG Tabs BID   Past Medical History:  Past Medical History:  Diagnosis Date   Acute kidney injury (HCC) 12/28/2014   Adjustment disorder with mixed anxiety and depressed mood 12/28/2014   Asthma    CHF (congestive heart failure) (HCC)    CPAP (continuous positive airway pressure) dependence    Diabetes mellitus without complication (HCC)    DM (diabetes mellitus) type II controlled with renal manifestation (HCC) 12/28/2014   DM (diabetes mellitus) type II controlled, neurological manifestation (HCC) 12/28/2014   Encephalopathy, hypertensive 12/28/2014   GERD (gastroesophageal reflux disease)     Hypertension    Hypertensive emergency without congestive heart failure 12/27/2014   Pneumonia    Possible Panic disorder 12/28/2014   Renal disorder    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   Sleep apnea    Past surgical history:  Past Surgical History:  Procedure Laterality Date   AV FISTULA PLACEMENT Left 01/05/2020   Procedure: LEFT ARM BRACHIOCEPHALIC ARTERIOVENOUS (AV) FISTULA;  Surgeon: Chuck Hint, MD;  Location: [facility] OR;  Service: Vascular;  Laterality: Left;   EMPYEMA DRAINAGE Right 05/17/2017   Procedure: EMPYEMA DRAINAGE AND DECORTICATION;  Surgeon: Delight Ovens, MD;  Location: [facility] OR;  Service: Thoracic;  Laterality: Right;   FLEXIBLE BRONCHOSCOPY  05/17/2017   Procedure: FLEXIBLE BRONCHOSCOPY;  Surgeon: Delight Ovens, MD;  Location: [facility] OR;  Service: Thoracic;;   INSERTION OF DIALYSIS CATHETER Right 12/12/2021   Procedure: INSERTION OF RIGHT INTERNAL JUGULAR DIALYSIS CATHETER;  Surgeon: Chuck Hint, MD;  Location: [facility] OR;  Service: Vascular;  Laterality: Right;   IR FLUORO GUIDE CV LINE RIGHT  01/01/2020   IR THORACENTESIS ASP PLEURAL SPACE W/IMG GUIDE  05/17/2017   IR US GUIDE VASC ACCESS RIGHT  01/01/2020   NO PAST SURGERIES     REVISON OF ARTERIOVENOUS FISTULA Left 12/12/2021   Procedure: REVISION OF LEFT ARM FISTULA BY PLICATION;  Surgeon: Chuck Hint, MD;  Location: [facility] OR;  Service: Vascular;  Laterality: Left;   THORACOTOMY/LOBECTOMY Right 05/17/2017   Procedure: RIGHT MINI THORACOTOMY;  Surgeon: Delight Ovens, MD;  Location: [facility] OR;  Service: Thoracic;  Laterality: Right;   VIDEO ASSISTED THORACOSCOPY (VATS)/EMPYEMA Right 05/17/2017   Procedure: RIGHT VIDEO ASSISTED THORACOSCOPY (VATS)/EMPYEMA;  Surgeon: Delight Ovens, MD;  Location: [facility] OR;  Service: Thoracic;  Laterality: Right;   Social:  Lives With: wife Occupation: disability  Support: wife and children Level of Function: independent with ADLs and iADLs PCP: Baruch Goldmann T,  FNP Substances: Denies alcohol, tobacco or other drug use  Family History:  Family History  Problem Relation Age of Onset   Hypertension Mother    Lung cancer Father    Allergies: Allergies as of 02/15/2023 - Reviewed 02/15/2023  Allergen Reaction Noted   Oxycodone Itching 01/21/2023   Review of Systems: A complete ROS was negative except as per HPI.   OBJECTIVE:   Physical Exam: Blood pressure (!) 162/88, pulse 72, temperature 98.4 F (36.9 C), temperature source Oral, resp. rate (!) 23, SpO2 100%.  Constitutional: laying in bed, on Bipap, satting well, in no acute distress HENT: normocephalic atraumatic, mucous membranes moist Eyes: conjunctiva non-erythematous Neck: supple Cardiovascular: regular rate and rhythm, no m/r/g; no LE edema appreciated; LUE fistula Pulmonary/Chest: normal work of breathing on bipap; slightly decreased inspiratory effort; significant dry cough triggered by deep breathing; lungs sound clear bilaterally with some decreased sounds  near the bases Abdominal: soft, non-tender, non-distended MSK: normal bulk and tone Neurological: alert & oriented x 3, 5/5 strength in bilateral upper and lower extremities, normal gait Skin: bilateral anterior chronic shin lesions Psych: Normal mood and affect  Labs: CBC    Component Value Date/Time   WBC 8.1 02/15/2023 2242   RBC 3.32 (L) 02/15/2023 2242   HGB 10.5 (L) 02/15/2023 2250   HGB 11.1 (L) 09/25/2022 1056   HCT 31.0 (L) 02/15/2023 2250   HCT 34.2 (L) 09/25/2022 1056   PLT 281 02/15/2023 2242   MCV 95.2 02/15/2023 2242   MCV 94 09/25/2022 1056   MCH 31.0 02/15/2023 2242   MCHC 32.6 02/15/2023 2242   RDW 13.7 02/15/2023 2242   RDW 13.2 09/25/2022 1056   LYMPHSABS 0.9 02/15/2023 2242   LYMPHSABS 1.8 09/25/2022 1056   MONOABS 0.5 02/15/2023 2242   EOSABS 0.0 02/15/2023 2242   EOSABS 0.2 09/25/2022 1056   BASOSABS 0.0 02/15/2023 2242   BASOSABS 0.0 09/25/2022 1056     CMP     Component Value  Date/Time   NA 136 02/15/2023 2250   K 3.8 02/15/2023 2250   CL 95 (L) 02/15/2023 2242   CO2 27 02/15/2023 2242   GLUCOSE 136 (H) 02/15/2023 2242   BUN 35 (H) 02/15/2023 2242   CREATININE 8.93 (H) 02/15/2023 2242   CALCIUM 9.3 02/15/2023 2242   PROT 7.2 02/15/2023 2242   ALBUMIN 3.1 (L) 02/15/2023 2242   AST 11 (L) 02/15/2023 2242   ALT 11 02/15/2023 2242   ALKPHOS 67 02/15/2023 2242   BILITOT 0.9 02/15/2023 2242   GFRNONAA 6 (L) 02/15/2023 2242   GFRAA 13 (L) 01/07/2020 0130   Respiratory panel: Negative Venous blood gas: pH 7.5, pCO2 37.3, pO2 24, bicarb 31.3 Lactic acid: 0.9 BNP: pending  Imaging: DG Chest 2 View CLINICAL DATA:  Shortness of breath.  EXAM: CHEST - 2 VIEW  COMPARISON:  01/21/2023  FINDINGS: Cardiomegaly, equivocal E worsened. Small right pleural effusion. Small amount of fluid in the right minor fissure. Worsening pulmonary edema. No pneumothorax.  IMPRESSION: 1. Worsening pulmonary edema and small right pleural effusion. 2. Cardiomegaly, equivocally worsened.  Electronically Signed   By: Narda Rutherford M.D.   On: 02/16/2023 00:02  EKG: personally reviewed my interpretation is sinus rhythm, no acute ischemic changes.  ASSESSMENT & PLAN:   Assessment & Plan by Problem: Principal Problem:   Acute hypoxic respiratory failure (HCC)  Sha Arens is a 57 y.o. person living with a history of ESRD on HD, T2DM, HTN, asthma, and HFpEF who presented with shortness of breath and a cough and is admitted for acute hypoxic respiratory failure on hospital day 0  Acute hypoxic respiratory failure, likely secondary to acute HF exacerbation HFpEF (EF 50-55% on 05/2022) Respiratory alkalosis He is satting well on BiPAP with resolution of his tachypnea and increased work of breathing. He has received one dose of IV Lasix 40 mg. He had dialysis earlier today. No evidence of hypercapnia at this time.  Etiology of his respiratory failure seems most likely to be  acute on chronic heart failure exacerbation given his progressive dyspnea on exertion, orthopnea, and pulmonary edema on chest x-ray.  This is further supported by an increase in his weight from a dry weight of 97 kg to 101 kg (per HD note).   Plan: - Check BNP - Phos, Mag, CBC, CMP - Wean Bipap as tolerated; restart diet - Strict I's & O's, daily weights - Echocardiogram -  Reassess morning volume status and consider redosing Lasix  Chronic conditions: ESRD on HD, MWF: Missed a dialysis session on Wednesday, but had a full session today.  K+ is wnl. If he is still hospitalized on Monday, will need to coordinate with nephrology so that he is able to make his Monday dialysis session.  Chronic mediastinal lymphadenopathy: [facility]. May need to reschedule if still here.  T2DM complicated by neuropathy: Glucose 136 on admission.  CBG checks per unit protocol, goal 140-180. On SSI.  On gabapentin 300 mg daily. HTN: BP 162/88. On amlodipine 10 mg daily, restarted. Held carvedilol 25 mg twice daily in the setting of HF. Chronic pain: Stopped taking Tylenol due to concerns it was causing his tingling symptoms.  On tramadol 50 mg twice daily for moderate pain, Dilaudid 1 mg every 4 hours as needed for severe pain. Lidocaine gel also. Held these medications for now.  Chronic bilateral leg wounds: Has camphor lotion. Chronic normocytic anemia: Hemoglobin 10.3, stable.  Most likely anemia of chronic disease in the setting of ESRD.  Diet: NPO on bipap; advance once off VTE: Heparin IVF: None,None Code: Full  Prior to Admission Living Arrangement: Home, living with wife Anticipated Discharge Location: Home Barriers to Discharge: medical workup and respiratory stability  Dispo: Admit patient to Observation with expected length of stay less than 2 midnights.  Signed: Annett Fabian, MD Internal Medicine Resident, PGY-1 [facility]  Pager:  [phone]  02/16/2023, 3:41 AM

## 2023-02-16 NOTE — Consult Note (Signed)
[facility] Renal Consultation Note    Indication for Consultation:  Management of ESRD/hemodialysis; anemia, hypertension/volume and secondary hyperparathyroidism  ZOX:WRUE, Deneda T, FNP  HPI: [name] is a 57 y.o. male with ESRD, HTN, HL, T2DM, OSA and chronic L leg wound.    Patient presented to the ED due to worsened SOB and nausea/vomiting over the last week.  Admits to dyspnea with exertion and orthopnea which have been present since last hospital discharge but became acutely worse the last few days.  Reports missing dialysis Wednesday but completed his full treatment yesterday.   Believes he may have had additional weight loss due to n/v over the last week.  Reports being able to hold some food down but vomiting associated with taking his medications and laying down.  Additional complaints include insomnia for weeks and tingling sensation all over his body that feels like he has no control.   Per outpatient chart review patient left 4kg over dry weight post HD yesterday with large gains between treatments.   Pertinent findings in work up include tachycardia, hypertension requiring nitro drip, hypoxia requiring O2 and brief BiPAP; negative respiratory panel and CXR with pulmonary edema.  Patient being admitted for further evaluation and management.   Past Medical History:  Diagnosis Date   Acute kidney injury (HCC) 12/28/2014   Adjustment disorder with mixed anxiety and depressed mood 12/28/2014   Asthma    CHF (congestive heart failure) (HCC)    CPAP (continuous positive airway pressure) dependence    Diabetes mellitus without complication (HCC)    DM (diabetes mellitus) type II controlled with renal manifestation (HCC) 12/28/2014   DM (diabetes mellitus) type II controlled, neurological manifestation (HCC) 12/28/2014   Encephalopathy, hypertensive 12/28/2014   GERD (gastroesophageal reflux disease)    Hypertension    Hypertensive emergency without congestive  heart failure 12/27/2014   Pneumonia    Possible Panic disorder 12/28/2014   Renal disorder    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   Sleep apnea    Past Surgical History:  Procedure Laterality Date   AV FISTULA PLACEMENT Left 01/05/2020   Procedure: LEFT ARM BRACHIOCEPHALIC ARTERIOVENOUS (AV) FISTULA;  Surgeon: Chuck Hint, MD;  Location: [facility] OR;  Service: Vascular;  Laterality: Left;   EMPYEMA DRAINAGE Right 05/17/2017   Procedure: EMPYEMA DRAINAGE AND DECORTICATION;  Surgeon: Delight Ovens, MD;  Location: [facility] OR;  Service: Thoracic;  Laterality: Right;   FLEXIBLE BRONCHOSCOPY  05/17/2017   Procedure: FLEXIBLE BRONCHOSCOPY;  Surgeon: Delight Ovens, MD;  Location: [facility] OR;  Service: Thoracic;;   INSERTION OF DIALYSIS CATHETER Right 12/12/2021   Procedure: INSERTION OF RIGHT INTERNAL JUGULAR DIALYSIS CATHETER;  Surgeon: Chuck Hint, MD;  Location: [facility] OR;  Service: Vascular;  Laterality: Right;   IR FLUORO GUIDE CV LINE RIGHT  01/01/2020   IR THORACENTESIS ASP PLEURAL SPACE W/IMG GUIDE  05/17/2017   IR US GUIDE VASC ACCESS RIGHT  01/01/2020   NO PAST SURGERIES     REVISON OF ARTERIOVENOUS FISTULA Left 12/12/2021   Procedure: REVISION OF LEFT ARM FISTULA BY PLICATION;  Surgeon: Chuck Hint, MD;  Location: [facility] OR;  Service: Vascular;  Laterality: Left;   THORACOTOMY/LOBECTOMY Right 05/17/2017   Procedure: RIGHT MINI THORACOTOMY;  Surgeon: Delight Ovens, MD;  Location: [facility] OR;  Service: Thoracic;  Laterality: Right;   VIDEO ASSISTED THORACOSCOPY (VATS)/EMPYEMA Right 05/17/2017   Procedure: RIGHT VIDEO ASSISTED THORACOSCOPY (VATS)/EMPYEMA;  Surgeon: Delight Ovens, MD;  Location: [facility] OR;  Service: Thoracic;  Laterality: Right;   Family History  Problem Relation Age of Onset   Hypertension Mother    Lung cancer Father    Social History:  reports that he has never smoked. He has never been exposed to tobacco smoke. He has never used smokeless tobacco. He reports  that he does not drink alcohol and does not use drugs. Allergies  Allergen Reactions   Oxycodone Itching   Prior to Admission medications   Medication Sig Start Date End Date Taking? Authorizing Provider  albuterol (PROVENTIL) (2.5 MG/3ML) 0.083% nebulizer solution Take 3 mLs (2.5 mg total) by nebulization every 6 (six) hours as needed for wheezing or shortness of breath. 09/25/22  Yes Alfonse Spruce, MD  albuterol (VENTOLIN HFA) 108 (90 Base) MCG/ACT inhaler Inhale 2 puffs into the lungs every 6 (six) hours as needed for wheezing or shortness of breath. 09/25/22  Yes Alfonse Spruce, MD  amLODipine (NORVASC) 10 MG tablet Take 1 tablet (10 mg total) by mouth daily. 05/24/22  Yes Rocky Morel, DO  carvedilol (COREG) 25 MG tablet Take 1 tablet (25 mg total) by mouth 2 (two) times daily with a meal. 05/24/22  Yes Rocky Morel, DO  HYDROmorphone (DILAUDID) 2 MG tablet Take 0.5 tablets (1 mg total) by mouth every 4 (four) hours as needed for severe pain (pain score 7-10). 01/24/23  Yes Peterson Ao, MD  levocetirizine (XYZAL) 5 MG tablet Take 1 tablet (5 mg total) by mouth every evening. 09/25/22  Yes Alfonse Spruce, MD  multivitamin (RENA-VIT) TABS tablet Take 1 tablet by mouth at bedtime. 05/24/22  Yes Rocky Morel, DO  traMADol (ULTRAM) 50 MG tablet Take 1 tablet (50 mg total) by mouth every 12 (twelve) hours as needed for moderate pain (pain score 4-6). 01/24/23  Yes Peterson Ao, MD  Vitamin D, Ergocalciferol, (DRISDOL) 1.25 MG (50000 UNIT) CAPS capsule Take 50,000 Units by mouth See admin instructions. Take one tablet by mouth on Monday, Wednesdays and Fridays per patient 08/17/19  Yes [provider]  XPHOZAH 30 MG TABS Take 1 tablet by mouth 2 (two) times daily. 01/15/23  Yes [provider]  Accu-Chek Softclix Lancets lancets Use as directed.  For insulin-dependent type 2 diabetes Monitor  glucose 3 times a day Patient taking differently: 1 each  by Other route See admin instructions. Use as directed.  For insulin-dependent type 2 diabetes Monitor  glucose 3 times a day 01/06/20   Albertine Grates, MD  acetaminophen (TYLENOL) 325 MG tablet Take 2 tablets (650 mg total) by mouth every 6 (six) hours. Patient not taking: Reported on 02/16/2023 12/03/22   Lockie Mola, MD  calcitRIOL (ROCALTROL) 0.25 MCG capsule Take 1 capsule (0.25 mcg total) by mouth every Monday, Wednesday, and Friday. 01/08/20   Albertine Grates, MD  camphor-menthol [facility]) lotion Apply topically 2 (two) times daily. Patient taking differently: Apply 1 Application topically 2 (two) times daily. 12/03/22   Lockie Mola, MD  gabapentin (NEURONTIN) 300 MG capsule Take 1 capsule (300 mg total) by mouth daily. 01/25/23   Peterson Ao, MD  glucose blood test strip Use as instructed For insulin-dependent type 2 diabetes Monitor  glucose 3 times a day Patient taking differently: 1 each by Other route See admin instructions. Use as instructed For insulin-dependent type 2 diabetes Monitor  glucose 3 times a day 01/06/20   Albertine Grates, MD  lidocaine (XYLOCAINE) 2 % jelly Apply 1 Application topically daily. 11/26/22   Alvira Monday, MD  Nutritional Supplements (,FEEDING SUPPLEMENT, PROSOURCE PLUS)  liquid Take 30 mLs by mouth 2 (two) times daily between meals. Patient not taking: Reported on 02/16/2023 12/03/22   Lockie Mola, MD  sevelamer carbonate (RENVELA) 800 MG tablet Take 3 tablets (2,400 mg total) by mouth 3 (three) times daily with meals. 12/03/22   Lockie Mola, MD  insulin aspart protamine - aspart (NOVOLOG MIX 70/30 FLEXPEN) (70-30) 100 UNIT/ML FlexPen Inject 15-30 Units into the skin 2 (two) times daily.  05/24/22  [provider]   Current Facility-Administered Medications  Medication Dose Route Frequency Provider Last Rate Last Admin   acetaminophen (TYLENOL) tablet 650 mg  650 mg Oral Q6H PRN Champ Mungo, DO   650 mg at 02/16/23 0800   albuterol (PROVENTIL) (2.5 MG/3ML)  0.083% nebulizer solution 2.5 mg  2.5 mg Nebulization Q6H PRN Champ Mungo, DO   2.5 mg at 02/16/23 0801   amLODipine (NORVASC) tablet 10 mg  10 mg Oral Daily Champ Mungo, DO   10 mg at 02/16/23 0800   [START ON 02/18/2023] calcitRIOL (ROCALTROL) capsule 0.25 mcg  0.25 mcg Oral Q M,W,F Champ Mungo, DO       gabapentin (NEURONTIN) capsule 300 mg  300 mg Oral Daily Champ Mungo, DO       heparin injection 5,000 Units  5,000 Units Subcutaneous Q8H Champ Mungo, DO   5,000 Units at 02/16/23 0631   insulin aspart (novoLOG) injection 0-6 Units  0-6 Units Subcutaneous TID WC Champ Mungo, DO       lidocaine (XYLOCAINE) 2 % jelly 1 Application  1 Application Topical Daily Champ Mungo, DO   1 Application at 02/16/23 0801   loratadine (CLARITIN) tablet 10 mg  10 mg Oral QPM Champ Mungo, DO       multivitamin (RENA-VIT) tablet 1 tablet  1 tablet Oral QHS Champ Mungo, DO       nitroGLYCERIN 50 mg in dextrose 5 % 250 mL (0.2 mg/mL) infusion  0-200 mcg/min Intravenous Continuous Fayrene Helper, PA-C 15 mL/hr at 02/16/23 0610 50 mcg/min at 02/16/23 0610   sevelamer carbonate (RENVELA) tablet 2,400 mg  2,400 mg Oral TID WC Champ Mungo, DO       Tenapanor HCl (CKD) TABS 1 tablet  1 tablet Oral BID Champ Mungo, DO       traMADol Janean Sark) tablet 50 mg  50 mg Oral Q12H PRN Champ Mungo, DO   50 mg at 02/16/23 0800   [START ON 02/18/2023] Vitamin D (Ergocalciferol) (DRISDOL) 1.25 MG (50000 UNIT) capsule 50,000 Units  50,000 Units Oral Q M,W,F Champ Mungo, DO       Current Outpatient Medications  Medication Sig Dispense Refill   albuterol (PROVENTIL) (2.5 MG/3ML) 0.083% nebulizer solution Take 3 mLs (2.5 mg total) by nebulization every 6 (six) hours as needed for wheezing or shortness of breath. 75 mL 1   albuterol (VENTOLIN HFA) 108 (90 Base) MCG/ACT inhaler Inhale 2 puffs into the lungs every 6 (six) hours as needed for wheezing or shortness of breath. 18 g 1   amLODipine (NORVASC) 10 MG tablet Take 1 tablet (10 mg total) by  mouth daily. 30 tablet 0   carvedilol (COREG) 25 MG tablet Take 1 tablet (25 mg total) by mouth 2 (two) times daily with a meal. 60 tablet 0   HYDROmorphone (DILAUDID) 2 MG tablet Take 0.5 tablets (1 mg total) by mouth every 4 (four) hours as needed for severe pain (pain score 7-10). 20 tablet 0   levocetirizine (XYZAL) 5 MG tablet Take 1 tablet (5 mg  total) by mouth every evening. 30 tablet 5   multivitamin (RENA-VIT) TABS tablet Take 1 tablet by mouth at bedtime. 30 tablet 0   traMADol (ULTRAM) 50 MG tablet Take 1 tablet (50 mg total) by mouth every 12 (twelve) hours as needed for moderate pain (pain score 4-6). 14 tablet 0   Vitamin D, Ergocalciferol, (DRISDOL) 1.25 MG (50000 UNIT) CAPS capsule Take 50,000 Units by mouth See admin instructions. Take one tablet by mouth on Monday, Wednesdays and Fridays per patient     XPHOZAH 30 MG TABS Take 1 tablet by mouth 2 (two) times daily.     Accu-Chek Softclix Lancets lancets Use as directed.  For insulin-dependent type 2 diabetes Monitor  glucose 3 times a day (Patient taking differently: 1 each by Other route See admin instructions. Use as directed.  For insulin-dependent type 2 diabetes Monitor  glucose 3 times a day) 100 each 0   acetaminophen (TYLENOL) 325 MG tablet Take 2 tablets (650 mg total) by mouth every 6 (six) hours. (Patient not taking: Reported on 02/16/2023)     calcitRIOL (ROCALTROL) 0.25 MCG capsule Take 1 capsule (0.25 mcg total) by mouth every Monday, Wednesday, and Friday. 12 capsule 0   camphor-menthol (SARNA) lotion Apply topically 2 (two) times daily. (Patient taking differently: Apply 1 Application topically 2 (two) times daily.) 222 mL 0   gabapentin (NEURONTIN) 300 MG capsule Take 1 capsule (300 mg total) by mouth daily. 14 capsule 0   glucose blood test strip Use as instructed For insulin-dependent type 2 diabetes Monitor  glucose 3 times a day (Patient taking differently: 1 each by Other route See admin instructions. Use  as instructed For insulin-dependent type 2 diabetes Monitor  glucose 3 times a day) 100 each 0   lidocaine (XYLOCAINE) 2 % jelly Apply 1 Application topically daily. 50 mL 0   Nutritional Supplements (,FEEDING SUPPLEMENT, PROSOURCE PLUS) liquid Take 30 mLs by mouth 2 (two) times daily between meals. (Patient not taking: Reported on 02/16/2023) 887 mL 0   sevelamer carbonate (RENVELA) 800 MG tablet Take 3 tablets (2,400 mg total) by mouth 3 (three) times daily with meals. 270 tablet 0   Labs: Basic Metabolic Panel: Recent Labs  Lab 02/15/23 2242 02/15/23 2250 02/16/23 0329  NA 135 136 136  K 3.8 3.8 3.6  CL 95*  --  95*  CO2 27  --  29  GLUCOSE 136*  --  126*  BUN 35*  --  37*  CREATININE 8.93*  --  9.31*  CALCIUM 9.3  --  9.6   Liver Function Tests: Recent Labs  Lab 02/15/23 2242 02/16/23 0329  AST 11* 10*  ALT 11 10  ALKPHOS 67 61  BILITOT 0.9 1.0  PROT 7.2 6.7  ALBUMIN 3.1* 2.9*    CBC: Recent Labs  Lab 02/15/23 2242 02/15/23 2250 02/16/23 0329  WBC 8.1  --  7.7  NEUTROABS 6.7  --   --   HGB 10.3* 10.5* 9.8*  HCT 31.6* 31.0* 30.8*  MCV 95.2  --  94.8  PLT 281  --  276   CBG: Recent Labs  Lab 02/16/23 0759  GLUCAP 117*   Studies/Results: DG Abd 1 View  Result Date: 02/16/2023 CLINICAL DATA:  Nausea. EXAM: ABDOMEN - 1 VIEW COMPARISON:  None Available. FINDINGS: Lung bases show patchy airspace disease in enlargement of the cardiopericardial silhouette. No gaseous bowel dilatation visualized in the abdomen or pelvis. Visualized bony anatomy unremarkable. IMPRESSION: 1. No gaseous bowel dilatation. 2.  Patchy bibasilar airspace disease. Electronically Signed   By: Kennith Center M.D.   On: 02/16/2023 08:17   DG Chest 2 View  Result Date: 02/16/2023 CLINICAL DATA:  Shortness of breath. EXAM: CHEST - 2 VIEW COMPARISON:  01/21/2023 FINDINGS: Cardiomegaly, equivocal E worsened. Small right pleural effusion. Small amount of fluid in the right minor fissure.  Worsening pulmonary edema. No pneumothorax. IMPRESSION: 1. Worsening pulmonary edema and small right pleural effusion. 2. Cardiomegaly, equivocally worsened. Electronically Signed   By: Narda Rutherford M.D.   On: 02/16/2023 00:02    ROS: All others negative except those listed in HPI.   Physical Exam: Vitals:   02/16/23 0623 02/16/23 0728 02/16/23 0731 02/16/23 0800  BP: (!) 160/79  (!) 180/104 (!) 184/104  Pulse: 73  73   Resp: 18  (!) 26   Temp: (!) 97.5 F (36.4 C)  97.9 F (36.6 C)   TempSrc: Oral  Oral   SpO2: 100%  100%   Weight:  95.3 kg    Height:  5\' 7"  (1.702 m)       General: WDWN male in NAD Head: NCAT sclera not icteric MMM Neck: Supple.  Lungs: +crackles b/l, nml WOB on neb w/3L O2 Heart: RRR. No murmur, rubs or gallops.  Abdomen: soft, nontender, +BS, no guarding, no rebound tenderness M/S:  Equal strength b/l in upper and lower extremities.  Lower extremities:no edema, ischemic changes, or open wounds  Neuro: AAOx3. Moves all extremities spontaneously. Psych:  Responds to questions appropriately with a normal affect. Dialysis Access:  Dialysis Orders:  MWF  - SW  4.25 hrs, BFR 400, DFR AF 1.5,  EDW 97kg, 2K/ 2Ca  Access: AVF  Heparin 5000 unit initial, 2000 unit intermittent Mircera 75 mcg q2wks - last 01/03/23 Hectorol IV qHD   Sensipar 180mg  QHD   Assessment/Plan:  Acute hypoxic respiratory failure - pulmonary edema on CXR, likely 2/2 missed HD + large fluid gains.  Plan for extra/make up HD today.  Counseled on fluid restrictions.  Nausea/Vomiting - abd xray w/no acute findings. Per PMD  ESRD -  on MWF.  Extra/make up HD today, will have to be off nitro drip to come upstairs.   Hypertension  - Severe HTN in ED, on nitro drip.   Anemia of CKD - Hgb 9.8. on ESA as outpatient.    Secondary Hyperparathyroidism -  Calcium in goal. Check phos. Continue home meds.   Nutrition - Renal diet w/fluid restrictions.   Virgina Norfolk, PA-C [facility] 02/16/2023, 8:21 AM

## 2023-02-16 NOTE — Progress Notes (Addendum)
HD#0 SUBJECTIVE:  Patient Summary: [name] is a 57 y.o. with a pertinent PMH of ESRD, HTN,T2DM and OSA and mediastinal lymphadenopathy, who presented with worsening SOB and N/V  and admitted for acute hypoxic respiratory failure likely in the setting of HF excerebration   Overnight Events: Admitted. Required BiPAP overnight   Interim History:  Patient is seen at bedside. He reports abdominal discomfort  Reports nerve pain all over. He was somewhat somnolent and barely kept his eyes open.  OBJECTIVE:  Vital Signs: Vitals:   02/16/23 1030 02/16/23 1125 02/16/23 1130 02/16/23 1131  BP: (!) 150/84 (!) 158/84 (!) 148/77   Pulse: 76 70 75   Resp: 20 20 19    Temp: 98 F (36.7 C) 98.4 F (36.9 C)    TempSrc:  Oral    SpO2: 100% 100% 100%   Weight:    95.5 kg  Height:       Supplemental O2: Nasal Cannula SpO2: 100 % O2 Flow Rate (L/min): 3 L/min FiO2 (%): 30 %  Filed Weights   02/16/23 0728 02/16/23 1131  Weight: 95.3 kg 95.5 kg     Intake/Output Summary (Last 24 hours) at 02/16/2023 1200 Last data filed at 02/16/2023 1059 Gross per 24 hour  Intake 98.82 ml  Output 100 ml  Net -1.18 ml   Net IO Since Admission: -1.18 mL [02/16/23 1200]  Physical Exam: Physical Exam Constitutional:      Comments: Laying in bed , appeared very uncomfortable  HENT:     Head: Normocephalic and atraumatic.  Cardiovascular:     Rate and Rhythm: Normal rate and regular rhythm.  Pulmonary:     Comments: On 3L at 100% Abdominal:     Comments: Diffusely mild  tenderness but no guarding or rebound  Normoactive bowel sounds   Skin:    General: Skin is warm and dry.  Neurological:     General: No focal deficit present.  Psychiatric:        Mood and Affect: Mood normal.        Behavior: Behavior normal.     Patient Lines/Drains/Airways Status     Active Line/Drains/Airways     Name Placement date Placement time Site Days   Peripheral IV 02/16/23 20 G Right Antecubital 02/16/23   0046  Antecubital  less than 1   Fistula / Graft Left Upper arm Arteriovenous fistula 12/12/21  0941  Upper arm  431   Y Chest Tube 1 and 2 05/17/17  1640  -- 2101   Wound / Incision (Open or Dehisced) 10/26/22 Other (Comment) Pretibial Distal;Left Anterior Lower leg/ Shin 10/26/22  0021  Pretibial  113             ASSESSMENT/PLAN:  Assessment: Principal Problem:   Acute hypoxic respiratory failure (HCC) Active Problems:   HTN (hypertension)   Chronic kidney disease on chronic dialysis (HCC)   ESRD (end stage renal disease) (HCC)   Fluid overload   Acute on chronic heart failure with preserved ejection fraction (HFpEF) (HCC)   Nausea and vomiting  Plan: #Acute Hypoxic Respiratory Failure 2/2 Pulmonary Edema  # Volume Overload 2/2 ESRD on HD and acute on chronic HFpEF Patient presented with SOB in the setting of volume overload , found to be in HF exacerbation with a BNP of 1200 from 661 three weeks ago from his previous admission with similar presentation. He required BiPAP at night. Patient seemed to be volume overloaded and scheduled for dialysis this morning. Will reassess volume  status and work of breathing after HD. - Appreciate Nephrology recs - HD for volume management  - Wean oxygen down as tolerated  #Severe Symptomatic Hypertension # Chronic HTN  - Improved after nitroglycerin infusion; given IV hydral and now going for HD - Continue amlodipine, follow up BP after HD for volume management   #Abdominal pain #Nausea and vomiting Patient reports abdominal discomfort this morning with N/V. Abdominal exam showed mild diffuse tenderness on palpation but had normoactive sounds. CT Abd/pelvis showed  no acute intra-abdominal pathology except a mild gallbladder wall  thickening.Abdominal XR also didn't show any evidence of obstruction. Suspects this is all due to elevated blood pressure in the setting on volume overload and patient would benefit from dialysis.Will reassess his  N/V and abdominal pain after dialysis   #Proteinuria UA showed presence of glucose and protein concerning for hypertension nephropathy . - Getting Protein:Cr ratio to further assess  #Mediastinal Lymphadenopathy - Scheduled for an an outpatient pulm follow up on  11/11  CHRONIC CONDITIONS  Anemia of chronic disease : Hgb at 9.8 today. Stable. ESA outpatient   Best Practice: Diet: NPO VTE: heparin injection 5,000 Units Start: 02/16/23 0600 Code: Full  DISPO: Anticipated discharge tomorrow to Home pending  Medical work up and treatment  .  Signature: Kathleen Lime , MD Internal Medicine Resident, PGY-1 [facility]  Pager: [phone] 12:00 PM, 02/16/2023   Please contact the on call pager after 5 pm and on weekends at [phone].

## 2023-02-16 NOTE — Progress Notes (Signed)
Pt arrived to unit from  ED. VSS, A/O x 4,  CCMD called ,CHG given, pt oriented to unit, Everlean Cherry, RN    02/16/23 1754  Vitals  Temp 98 F (36.7 C)  Temp Source Oral  BP (!) 174/93  MAP (mmHg) 117  BP Location Right Arm  BP Method Automatic  Patient Position (if appropriate) Lying  Pulse Rate 77  Pulse Rate Source Monitor  ECG Heart Rate 79  Resp 14  Level of Consciousness  Level of Consciousness Alert  Oxygen Therapy  SpO2 94 %  O2 Device Nasal Cannula  O2 Flow Rate (L/min) 3 L/min  Pain Assessment  Pain Scale 0-10  Pain Score 0  MEWS Score  MEWS Temp 0  MEWS Systolic 0  MEWS Pulse 0  MEWS RR 0  MEWS LOC 0  MEWS Score 0  MEWS Score Color Ch[place] Si

## 2023-02-16 NOTE — ED Notes (Signed)
Remains in dialysis at this time.

## 2023-02-16 NOTE — Progress Notes (Addendum)
Pt tolerated tx well and goal met.  02/16/23 1452  Vitals  Temp 98 F (36.7 C)  Temp Source Oral  BP (!) 146/76  BP Location Right Arm  BP Method Automatic  Patient Position (if appropriate) Lying  Pulse Rate 70  Resp 16  Oxygen Therapy  SpO2 100 %  O2 Device Nasal Cannula  O2 Flow Rate (L/min) 3 L/min  During Treatment Monitoring  Intra-Hemodialysis Comments Tx completed  Post Treatment  Dialyzer Clearance Lightly streaked  Hemodialysis Intake (mL) 0 mL  Liters Processed 72  Fluid Removed (mL) 3500 mL  Tolerated HD Treatment Yes  Post-Hemodialysis Comments Pt goal met.  AVG/AVF Arterial Site Held (minutes) 10 minutes  AVG/AVF Venous Site Held (minutes) 10 minutes

## 2023-02-16 NOTE — ED Notes (Signed)
Patient placed on bipap

## 2023-02-16 NOTE — Progress Notes (Signed)
   02/16/23 0731  Therapy Vitals  Temp 97.9 F (36.6 C)  Temp Source Oral  Pulse Rate 73 (Simultaneous filing. User may not have seen previous data.)  Resp (!) 26 (Simultaneous filing. User may not have seen previous data.)  BP (!) 180/104 (Simultaneous filing. User may not have seen previous data.)  MEWS Score/Color  MEWS Score 2  MEWS Score Color Yellow  Respiratory Assessment  Assessment Type Assess only  Respiratory Pattern Regular;Unlabored  Chest Assessment Chest expansion symmetrical  Bilateral Breath Sounds Diminished  Oxygen Therapy/Pulse Ox  O2 Device (S)  Nasal Cannula (v60 on stand by  Simultaneous filing. User may not have seen previous data.)  O2 Therapy Oxygen  O2 Flow Rate (L/min) 3 L/min (Simultaneous filing. User may not have seen previous data.)  SpO2 100 % (Simultaneous filing. User may not have seen previous data.)

## 2023-02-16 NOTE — ED Notes (Signed)
Pt got nauseous and vomited a small amount. When this nurse went into room bipap was off. This nurse placed patient on 3L nasal canula, alerted RT and alerted DO Dixon.

## 2023-02-17 ENCOUNTER — Inpatient Hospital Stay (HOSPITAL_COMMUNITY): Payer: Medicare Other

## 2023-02-17 DIAGNOSIS — R0609 Other forms of dyspnea: Secondary | ICD-10-CM

## 2023-02-17 DIAGNOSIS — I5033 Acute on chronic diastolic (congestive) heart failure: Secondary | ICD-10-CM

## 2023-02-17 LAB — ECHOCARDIOGRAM COMPLETE
AR max vel: 2.75 cm2
AV Area VTI: 2.62 cm2
AV Area mean vel: 2.59 cm2
AV Mean grad: 7 mm[Hg]
AV Peak grad: 13.7 mm[Hg]
Ao pk vel: 1.85 m/s
Area-P 1/2: 3.16 cm2
Height: 67 in
S' Lateral: 3 cm
Weight: 3392 [oz_av]

## 2023-02-17 LAB — GLUCOSE, CAPILLARY
Glucose-Capillary: 106 mg/dL — ABNORMAL HIGH (ref 70–99)
Glucose-Capillary: 167 mg/dL — ABNORMAL HIGH (ref 70–99)
Glucose-Capillary: 214 mg/dL — ABNORMAL HIGH (ref 70–99)
Glucose-Capillary: 210 mg/dL — ABNORMAL HIGH (ref 70–99)

## 2023-02-17 LAB — CBC
HCT: 33.9 % — ABNORMAL LOW (ref 39.0–52.0)
Hemoglobin: 11.1 g/dL — ABNORMAL LOW (ref 13.0–17.0)
MCH: 30.7 pg (ref 26.0–34.0)
MCHC: 32.7 g/dL (ref 30.0–36.0)
MCV: 93.9 fL (ref 80.0–100.0)
Platelets: 313 10*3/uL (ref 150–400)
RBC: 3.61 MIL/uL — ABNORMAL LOW (ref 4.22–5.81)
RDW: 13.2 % (ref 11.5–15.5)
WBC: 7.5 10*3/uL (ref 4.0–10.5)
nRBC: 0 % (ref 0.0–0.2)

## 2023-02-17 LAB — RENAL FUNCTION PANEL
Albumin: 3 g/dL — ABNORMAL LOW (ref 3.5–5.0)
Anion gap: 16 — ABNORMAL HIGH (ref 5–15)
BUN: 32 mg/dL — ABNORMAL HIGH (ref 6–20)
CO2: 25 mmol/L (ref 22–32)
Calcium: 10 mg/dL (ref 8.9–10.3)
Chloride: 94 mmol/L — ABNORMAL LOW (ref 98–111)
Creatinine, Ser: 7.51 mg/dL — ABNORMAL HIGH (ref 0.61–1.24)
GFR, Estimated: 8 mL/min — ABNORMAL LOW (ref >60)
Glucose, Bld: 99 mg/dL (ref 70–99)
Phosphorus: 6.6 mg/dL — ABNORMAL HIGH (ref 2.5–4.6)
Potassium: 3.8 mmol/L (ref 3.5–5.1)
Sodium: 135 mmol/L (ref 135–145)

## 2023-02-17 MED ORDER — POLYETHYLENE GLYCOL 3350 17 G PO PACK
17.0000 g | PACK | Freq: Every day | ORAL | Status: DC
  Administered 2023-02-17 – 2023-02-19 (×2): 17 g via ORAL
  Filled 2023-02-17 (×3): qty 1

## 2023-02-17 MED ORDER — SENNOSIDES-DOCUSATE SODIUM 8.6-50 MG PO TABS
1.0000 | ORAL_TABLET | Freq: Two times a day (BID) | ORAL | Status: DC
  Administered 2023-02-17 – 2023-02-19 (×4): 1 via ORAL
  Filled 2023-02-17 (×4): qty 1

## 2023-02-17 NOTE — Progress Notes (Signed)
Hospital day#1 Subjective:   Summary: [name] is a 57 yo person with a history of ESRD on HD, HTN, T2DM, OSA, mediastinal lymphadenopathy, therapeutric opiod use, and constipation admitted for severe symptomatic hypertension with pulmonary edema likely in the setting of fluid overload from incomplete HD sessions now s/p HD, improving  Overnight Events: HD with 3.5 L removal  Patient is overall doing better. Tolerating PO intake. No BM overnight; he started Tepanor for IBS-C ~ 2 weeks ago per PCP with mild improvement. Received PO BP meds after dialysis with improvement in HA, nausea and vomiting. Abdominal pain is improving as well.  Has not ambulated yet as was afraid of discomfort.  Discussed the length of dialysis sessions and patient reports increased anxiety the longer he sits on the chair. Buspar and atarax have not been as effective as he thought. This is the reason he has been leaving before the end of sessions.   Objective:  Vital signs in last 24 hours: Vitals:   02/16/23 2310 02/17/23 0631 02/17/23 0636 02/17/23 0724  BP: (!) 153/82  (!) 159/90 (!) 158/87  Pulse: 74 88 79 79  Resp: 18 16 20 20   Temp: 98.4 F (36.9 C)  98.3 F (36.8 C) 98.1 F (36.7 C)  TempSrc: Oral  Oral Oral  SpO2: 98% 98% 96% 98%  Weight:  96.2 kg    Height:       Supplemental O2: Nasal Cannula SpO2: 98 % O2 Flow Rate (L/min): 1 L/min FiO2 (%): 30 % Filed Weights   02/16/23 1131 02/16/23 1451 02/17/23 0631  Weight: 95.5 kg 92 kg 96.2 kg    Physical Exam:  Constitutional: well appearing HENT: normocephalic atraumatic, moist mucous membranes Neck: supple Cardiovascular: regular rate and rhythm, no m/r/g Pulmonary/Chest: normal work of breathing on room air, lungs clear to auscultation bilaterally. No wheezing Abdominal: protuberant abdomen. Normal BS, soft, non-tender, non-distended. **fluid wave. MSK: normal bulk and tone. *no pitting edema or asterixis Neurological: alert &  oriented x 3, moving all extremities after instructions. Skin: warm and dry; anterior shins with tender lesions Psych: Pleasant mood and affect    Intake/Output Summary (Last 24 hours) at 02/17/2023 0854 Last data filed at 02/16/2023 1452 Gross per 24 hour  Intake 77.26 ml  Output 3500 ml  Net -3422.74 ml   Net IO Since Admission: -3,501.18 mL [02/17/23 0854]  Pertinent Labs:    Latest Ref Rng & Units 02/17/2023    8:23 AM 02/16/2023    3:29 AM 02/15/2023   10:50 PM  CBC  WBC 4.0 - 10.5 K/uL 7.5  7.7    Hemoglobin 13.0 - 17.0 g/dL 16.1  9.8  09.6   Hematocrit 39.0 - 52.0 % 33.9  30.8  31.0   Platelets 150 - 400 K/uL 313  276         Latest Ref Rng & Units 02/16/2023    3:29 AM 02/15/2023   10:50 PM 02/15/2023   10:42 PM  CMP  Glucose 70 - 99 mg/dL 045   409   BUN 6 - 20 mg/dL 37   35   Creatinine 8.11 - 1.24 mg/dL 9.14   7.82   Sodium 956 - 145 mmol/L 136  136  135   Potassium 3.5 - 5.1 mmol/L 3.6  3.8  3.8   Chloride 98 - 111 mmol/L 95   95   CO2 22 - 32 mmol/L 29   27   Calcium 8.9 - 10.3 mg/dL 9.6  9.3   Total Protein 6.5 - 8.1 g/dL 6.7   7.2   Total Bilirubin <1.2 mg/dL 1.0   0.9   Alkaline Phos 38 - 126 U/L 61   67   AST 15 - 41 U/L 10   11   ALT 0 - 44 U/L 10   11     Imaging: CT ABDOMEN PELVIS WO CONTRAST  Result Date: 02/16/2023 CLINICAL DATA:  Acute abdominal pain. EXAM: CT ABDOMEN AND PELVIS WITHOUT CONTRAST TECHNIQUE: Multidetector CT imaging of the abdomen and pelvis was performed following the standard protocol without IV contrast. RADIATION DOSE REDUCTION: This exam was performed according to the departmental dose-optimization program which includes automated exposure control, adjustment of the mA and/or kV according to patient size and/or use of iterative reconstruction technique. COMPARISON:  05/30/2022. FINDINGS: Lower chest: Trace bilateral pleural effusions identified interlobular septal thickening and ground-glass attenuation noted lower lung zones.  Subsegmental atelectasis within the periphery of the lung bases Hepatobiliary: No focal liver abnormality. There is a suggestion of mild gallbladder wall thickening. No pericholecystic fluid. No gallstones identified. No bile duct dilatation. Pancreas: Unremarkable. No pancreatic ductal dilatation or surrounding inflammatory changes. Spleen: Normal in size without focal abnormality. Adrenals/Urinary Tract: Normal adrenal glands. Punctate stone scratch set several punctate stones noted within the upper pole of left kidney and inferior pole of right kidney. Bilateral renal cortical volume loss, increased from previous exam. Several simple appearing right kidney cysts are noted. The largest arises off the inferior pole of the right kidney measuring 1.5 cm, image 43/3. No follow-up imaging recommended. No signs of hydronephrosis bilaterally. No ureteral calculi. Bladder appears normal. Stomach/Bowel: Stomach is normal. Appendix is normal. No pathologic dilatation of the large or small bowel loops. Scattered colonic diverticula without signs of acute diverticulitis. No bowel wall thickening or inflammatory change. Vascular/Lymphatic: Aortic atherosclerosis. No aneurysm. No signs of abdominopelvic adenopathy. Reproductive: Prostate is unremarkable. Other: No free fluid or fluid collections. No signs of pneumoperitoneum. Small broad-based fat containing umbilical hernia. Musculoskeletal: No acute or significant osseous findings. IMPRESSION: 1. No acute findings within the abdomen or pelvis. 2. Trace bilateral pleural effusions with interlobular septal thickening and ground-glass attenuation noted lower lung zones. Findings are favored to represent mild pulmonary edema. 3. Suggestion of mild gallbladder wall thickening. No gallstones identified. If there is clinical concern for acute cholecystitis consider further evaluation with right upper quadrant ultrasound. 4. Bilateral, punctate nonobstructing renal calculi. 5.  Colonic diverticulosis without signs of acute diverticulitis. 6. Small broad-based fat containing umbilical hernia. 7.  Aortic Atherosclerosis (ICD10-I70.0). Electronically Signed   By: Signa Kell M.D.   On: 02/16/2023 09:30    Assessment/Plan:   Principal Problem:   Severe hypertension Active Problems:   Chronic kidney disease on chronic dialysis (HCC)   ESRD (end stage renal disease) (HCC)   Fluid overload   Acute hypoxic respiratory failure (HCC)   Acute on chronic heart failure with preserved ejection fraction (HFpEF) (HCC)   Nausea and vomiting  Severe symptomatic hypertension Hypertensive nephropathy N/V/abdominal pain improved after HD session on 11/10. Started on hydralazine 50 mg TID with improvement of readings. -Amlodipine 10 mg -Coreg 25 mg BID -Hydralazine 50 mg TID -If severely hypertensive, may consider IMDUR therapy  ESRD on HD MWF Anemia of CKD Secondary hyperparathyroidism S/p HD on 11/9 with improvement in volume status and BP -Appreciate Nephrology's assistance -ESA as outpatient -Continue home meds: vit D, renvela, Tenapanor  HFpEF ( 05/2022 EF 50-55%) Initially admitted for AHRF with  pulmonary vascular congestion now on O2 for comfort but s -Follow  up TTE today -Continue Coreg  Chronic pain  IBS constipation L leg pain and back pain, chronic. CT abdomen and pelvis without obstruction or acute findings suggesting acute findings. Colonic diverticulosis.  -Continue Tramadol 50 mg q12HR PRN  -Adding Miralax and Senna S scheduled -Dilaudid 5 mg q4HR as needed for pain ( chronic medication) ; but patient has only taken for >15 days. Will hold.  -Tenapanor, continue  T2DM Neuropathy -SSI  -Continue home Gabapentin 300 mg daily  Diet: Renal VTE: Heparin Code: Full  Dispo: Anticipated discharge to Home in 1 days pending Clinical improvement.   Morene Crocker, MD Internal Medicine Resident PGY-2 Please contact the on call pager after 5 pm  and on weekends at [phone].

## 2023-02-17 NOTE — Progress Notes (Signed)
Patient ID: [name], male   DOB: March 27, 1966, 57 y.o.   MRN: [id number] [facility] Progress Note   Assessment/ Plan:    Acute hypoxic respiratory failure -secondary to volume overload in this patient with large intradialytic weight gain and a missed dialysis treatment last week.  Underwent hemodialysis yesterday with successful ultrafiltration and ability to improve oxygenation of supplementation.  The plan is to undertake dialysis again tomorrow (which can also be done at his outpatient dialysis unit if he is accepting to be discharged home today and found not to be hypoxic with ambulation).  Will tentatively order this again for tomorrow. Nausea/Vomiting -this is resolved with negative workup for acute intra-abdominal process.  Resumed medications for IBS.  ESRD -he is usually on a Monday/Wednesday/Friday schedule and underwent extra hemodialysis yesterday for volume unloading.  Next dialysis tomorrow (inpatient versus outpatient based on assessment per primary service)  Hypertension  -blood pressures improved with resumption of antihypertensive therapy as well as ultrafiltration with hemodialysis.  Anemia of CKD -hemoglobin levels improved with ultrafiltration/hemodialysis indicating that initial low values may indeed have been part from dilution.  On ESA as outpatient.    Secondary Hyperparathyroidism -  Calcium in goal. Check phos. Continue home meds.   Nutrition - Renal diet w/fluid restrictions.   Subjective:   Reports that breathing is improved but still not back to baseline after hemodialysis/ultrafiltration yesterday.  He remains on oxygen via nasal cannula primarily because of apprehension/anxiety.   Objective:   BP (!) 158/87 (BP Location: Right Arm)   Pulse 79   Temp 98.1 F (36.7 C) (Oral)   Resp 20   Ht 5\' 7"  (1.702 m)   Wt 96.2 kg   SpO2 98%   BMI 33.20 kg/m   Physical Exam: Gen: Comfortably resting in bed, on oxygen via nasal cannula CVS: Pulse regular  rhythm, normal rate, S1 and S2 normal Resp: Clear to auscultation, no rales/rhonchi Abd: Soft, obese, nontender, bowel sounds normal Ext: No lower extremity edema, intact dressing over left upper arm aVF  Labs: BMET Recent Labs  Lab 02/15/23 2242 02/15/23 2250 02/16/23 0329 02/17/23 0823  NA 135 136 136 135  K 3.8 3.8 3.6 3.8  CL 95*  --  95* 94*  CO2 27  --  29 25  GLUCOSE 136*  --  126* 99  BUN 35*  --  37* 32*  CREATININE 8.93*  --  9.31* 7.51*  CALCIUM 9.3  --  9.6 10.0  PHOS  --   --  7.1* 6.6*   CBC Recent Labs  Lab 02/15/23 2242 02/15/23 2250 02/16/23 0329 02/17/23 0823  WBC 8.1  --  7.7 7.5  NEUTROABS 6.7  --   --   --   HGB 10.3* 10.5* 9.8* 11.1*  HCT 31.6* 31.0* 30.8* 33.9*  MCV 95.2  --  94.8 93.9  PLT 281  --  276 313      Medications:     amLODipine  10 mg Oral Daily   [START ON 02/18/2023] calcitRIOL  0.25 mcg Oral Q M,W,F   Chlorhexidine Gluconate Cloth  6 each Topical Q0600   gabapentin  300 mg Oral Daily   heparin  5,000 Units Subcutaneous Q8H   hydrALAZINE  50 mg Oral Q8H   insulin aspart  0-6 Units Subcutaneous TID WC   lidocaine  1 Application Topical Daily   loratadine  10 mg Oral QPM   multivitamin  1 tablet Oral QHS   polyethylene glycol  17  g Oral Daily   senna-docusate  1 tablet Oral BID   sevelamer carbonate  2,400 mg Oral TID WC   Tenapanor HCl (CKD)  1 tablet Oral BID   [START ON 02/18/2023] Vitamin D (Ergocalciferol)  50,000 Units Oral Q M,W,F   Zetta Bills, MD 02/17/2023, 10:13 AM

## 2023-02-18 DIAGNOSIS — Z992 Dependence on renal dialysis: Secondary | ICD-10-CM | POA: Diagnosis not present

## 2023-02-18 DIAGNOSIS — I120 Hypertensive chronic kidney disease with stage 5 chronic kidney disease or end stage renal disease: Secondary | ICD-10-CM | POA: Diagnosis not present

## 2023-02-18 DIAGNOSIS — N186 End stage renal disease: Secondary | ICD-10-CM | POA: Diagnosis not present

## 2023-02-18 LAB — MAGNESIUM: Magnesium: 2.7 mg/dL — ABNORMAL HIGH (ref 1.7–2.4)

## 2023-02-18 LAB — CBC
HCT: 32.7 % — ABNORMAL LOW (ref 39.0–52.0)
Hemoglobin: 10.6 g/dL — ABNORMAL LOW (ref 13.0–17.0)
MCH: 30.2 pg (ref 26.0–34.0)
MCHC: 32.4 g/dL (ref 30.0–36.0)
MCV: 93.2 fL (ref 80.0–100.0)
Platelets: 325 10*3/uL (ref 150–400)
RBC: 3.51 MIL/uL — ABNORMAL LOW (ref 4.22–5.81)
RDW: 13.2 % (ref 11.5–15.5)
WBC: 8.3 10*3/uL (ref 4.0–10.5)
nRBC: 0 % (ref 0.0–0.2)

## 2023-02-18 LAB — RENAL FUNCTION PANEL
Albumin: 3 g/dL — ABNORMAL LOW (ref 3.5–5.0)
Anion gap: 14 (ref 5–15)
BUN: 44 mg/dL — ABNORMAL HIGH (ref 6–20)
CO2: 26 mmol/L (ref 22–32)
Calcium: 10 mg/dL (ref 8.9–10.3)
Chloride: 92 mmol/L — ABNORMAL LOW (ref 98–111)
Creatinine, Ser: 9.89 mg/dL — ABNORMAL HIGH (ref 0.61–1.24)
GFR, Estimated: 6 mL/min — ABNORMAL LOW (ref >60)
Glucose, Bld: 171 mg/dL — ABNORMAL HIGH (ref 70–99)
Phosphorus: 6.6 mg/dL — ABNORMAL HIGH (ref 2.5–4.6)
Potassium: 3.6 mmol/L (ref 3.5–5.1)
Sodium: 132 mmol/L — ABNORMAL LOW (ref 135–145)

## 2023-02-18 LAB — GLUCOSE, CAPILLARY
Glucose-Capillary: 158 mg/dL — ABNORMAL HIGH (ref 70–99)
Glucose-Capillary: 136 mg/dL — ABNORMAL HIGH (ref 70–99)
Glucose-Capillary: 153 mg/dL — ABNORMAL HIGH (ref 70–99)

## 2023-02-18 MED ORDER — CARVEDILOL 25 MG PO TABS
25.0000 mg | ORAL_TABLET | Freq: Two times a day (BID) | ORAL | Status: DC
  Administered 2023-02-18 – 2023-02-19 (×2): 25 mg via ORAL
  Filled 2023-02-18 (×2): qty 1

## 2023-02-18 NOTE — Progress Notes (Signed)
[facility] Progress Note   Subjective:   Patient seated on bedside. Not on O2 but reports worsening cough and orthopnea today, reports he could not breath when laying flat for BP check. Denies CP, HA, dizziness.   Objective Vitals:   02/18/23 0300 02/18/23 0700 02/18/23 0729 02/18/23 0800  BP: (!) 182/92  (!) 170/92   Pulse: 83  85   Resp: 17 20 18  (!) 21  Temp: 97.8 F (36.6 C)  98.4 F (36.9 C)   TempSrc: Oral  Oral   SpO2: 91%  95%   Weight:      Height:       Physical Exam General: Alert male in NAD Heart: RRR, no murmur, rubs or gallops Lungs: CTA bilaterally, respirations unlabored on RA Abdomen: Soft, non-distended, +BS Extremities: No edema b/l lower extremities Dialysis Access: LUE AVF + t/b, aneurysmal  Additional Objective Labs: Basic Metabolic Panel: Recent Labs  Lab 02/16/23 0329 02/17/23 0823 02/18/23 0337  NA 136 135 132*  K 3.6 3.8 3.6  CL 95* 94* 92*  CO2 29 25 26   GLUCOSE 126* 99 171*  BUN 37* 32* 44*  CREATININE 9.31* 7.51* 9.89*  CALCIUM 9.6 10.0 10.0  PHOS 7.1* 6.6* 6.6*   Liver Function Tests: Recent Labs  Lab 02/15/23 2242 02/16/23 0329 02/17/23 0823 02/18/23 0337  AST 11* 10*  --   --   ALT 11 10  --   --   ALKPHOS 67 61  --   --   BILITOT 0.9 1.0  --   --   PROT 7.2 6.7  --   --   ALBUMIN 3.1* 2.9* 3.0* 3.0*   Recent Labs  Lab 02/15/23 2242  LIPASE 81*   CBC: Recent Labs  Lab 02/15/23 2242 02/15/23 2250 02/16/23 0329 02/17/23 0823 02/18/23 0337  WBC 8.1  --  7.7 7.5 8.3  NEUTROABS 6.7  --   --   --   --   HGB 10.3*   < > 9.8* 11.1* 10.6*  HCT 31.6*   < > 30.8* 33.9* 32.7*  MCV 95.2  --  94.8 93.9 93.2  PLT 281  --  276 313 325   < > = values in this interval not displayed.   Blood Culture    Component Value Date/Time   SDES BLOOD RIGHT ANTECUBITAL 11/30/2022 1726   SPECREQUEST  11/30/2022 1726    BOTTLES DRAWN AEROBIC AND ANAEROBIC Blood Culture adequate volume   CULT  11/30/2022 1726     NO GROWTH 5 DAYS Performed at [facility], [address] [place], [place] [address]    REPTSTATUS 12/05/2022 FINAL 11/30/2022 1726    Cardiac Enzymes: No results for input(s): "CKTOTAL", "CKMB", "CKMBINDEX", "TROPONINI" in the last 168 hours. CBG: Recent Labs  Lab 02/17/23 1113 02/17/23 1603 02/17/23 2123 02/18/23 0612 02/18/23 0726  GLUCAP 167* 214* 210* 158* 136*   Iron Studies: No results for input(s): "IRON", "TIBC", "TRANSFERRIN", "FERRITIN" in the last 72 hours. @lablastinr3 @ Studies/Results: ECHOCARDIOGRAM COMPLETE  Result Date: 02/17/2023    ECHOCARDIOGRAM REPORT   Patient Name:   [facility] Date of Exam: 02/17/2023 Medical Rec #:  [id number]     Height:       67.0 in Accession #:    [id number]    Weight:       212.0 lb Date of Birth:  September 30, 1965      BSA:          2.073 m Patient Age:  57 years      BP:           158/87 mmHg Patient Gender: M             HR:           76 bpm. Exam Location:  Inpatient Procedure: 2D Echo, Color Doppler and Cardiac Doppler Indications:    CHF/Dyspnea  History:        Patient has prior history of Echocardiogram examinations, most                 recent 05/24/2022. CHF, ESRD. CKD; Risk Factors:Hypertension and                 Diabetes.  Sonographer:    Milbert Coulter Referring Phys: [id number] CAROLYN GUILLOUD IMPRESSIONS  1. Enhanced echogenicity and severe thickening up to 2.1 cm of the myocardium -consider evaluation for infiltrative disease such as amyloidosis given history. Left ventricular ejection fraction, by estimation, is 60 to 65%. The left ventricle has normal  function. The left ventricle has no regional wall motion abnormalities. There is severe left ventricular hypertrophy. Left ventricular diastolic parameters are consistent with Grade I diastolic dysfunction (impaired relaxation). Elevated left ventricular end-diastolic pressure. The E/e' is 21.  2. Right ventricular systolic function is normal. The right ventricular size is  normal. Tricuspid regurgitation signal is inadequate for assessing PA pressure.  3. The mitral valve is grossly normal. Trivial mitral valve regurgitation.  4. The aortic valve is tricuspid. Aortic valve regurgitation is not visualized.  5. Aortic dilatation noted. There is borderline dilatation of the ascending aorta, measuring 39 mm.  6. The inferior vena cava is normal in size with greater than 50% respiratory variability, suggesting right atrial pressure of 3 mmHg. Comparison(s): Changes from prior study are noted. 05/24/2022: LVEF 50%, ascending aorta 38 mm. FINDINGS  Left Ventricle: Enhanced echogenicity and severe thickening up to 2.1 cm of the myocardium -consider evaluation for infiltrative disease such as amyloidosis given history. Left ventricular ejection fraction, by estimation, is 60 to 65%. The left ventricle has normal function. The left ventricle has no regional wall motion abnormalities. The left ventricular internal cavity size was small. There is severe left ventricular hypertrophy. Left ventricular diastolic parameters are consistent with Grade I diastolic dysfunction (impaired relaxation). Elevated left ventricular end-diastolic pressure. The E/e' is 21. Right Ventricle: The right ventricular size is normal. No increase in right ventricular wall thickness. Right ventricular systolic function is normal. Tricuspid regurgitation signal is inadequate for assessing PA pressure. Left Atrium: Left atrial size was normal in size. Right Atrium: Right atrial size was normal in size. Pericardium: There is no evidence of pericardial effusion. Mitral Valve: The mitral valve is grossly normal. Trivial mitral valve regurgitation. Tricuspid Valve: The tricuspid valve is normal in structure. Tricuspid valve regurgitation is not demonstrated. Aortic Valve: The aortic valve is tricuspid. Aortic valve regurgitation is not visualized. Aortic valve mean gradient measures 7.0 mmHg. Aortic valve peak gradient measures  13.7 mmHg. Aortic valve area, by VTI measures 2.62 cm. Pulmonic Valve: The pulmonic valve was normal in structure. Pulmonic valve regurgitation is not visualized. Aorta: Aortic dilatation noted. There is borderline dilatation of the ascending aorta, measuring 39 mm. Venous: The inferior vena cava is normal in size with greater than 50% respiratory variability, suggesting right atrial pressure of 3 mmHg. IAS/Shunts: No atrial level shunt detected by color flow Doppler.  LEFT VENTRICLE PLAX 2D LVIDd:         4.30 cm  Diastology LVIDs:         3.00 cm   LV e' medial:    4.90 cm/s LV PW:         1.70 cm   LV E/e' medial:  20.3 LV IVS:        2.10 cm   LV e' lateral:   4.46 cm/s LVOT diam:     2.10 cm   LV E/e' lateral: 22.3 LV SV:         94 LV SV Index:   45 LVOT Area:     3.46 cm  RIGHT VENTRICLE RV Basal diam:  3.60 cm RV Mid diam:    2.30 cm RV S prime:     19.30 cm/s TAPSE (M-mode): 2.5 cm LEFT ATRIUM             Index        RIGHT ATRIUM           Index LA diam:        4.40 cm 2.12 cm/m   RA Area:     18.00 cm LA Vol (A2C):   74.0 ml 35.70 ml/m  RA Volume:   46.90 ml  22.62 ml/m LA Vol (A4C):   58.0 ml 27.98 ml/m LA Biplane Vol: 67.1 ml 32.37 ml/m  AORTIC VALVE AV Area (Vmax):    2.75 cm AV Area (Vmean):   2.59 cm AV Area (VTI):     2.62 cm AV Vmax:           185.00 cm/s AV Vmean:          123.000 cm/s AV VTI:            0.359 m AV Peak Grad:      13.7 mmHg AV Mean Grad:      7.0 mmHg LVOT Vmax:         147.00 cm/s LVOT Vmean:        92.100 cm/s LVOT VTI:          0.272 m LVOT/AV VTI ratio: 0.76  AORTA Ao Root diam: 3.20 cm Ao Asc diam:  3.90 cm MITRAL VALVE MV Area (PHT): 3.16 cm     SHUNTS MV Decel Time: 240 msec     Systemic VTI:  0.27 m MV E velocity: 99.60 cm/s   Systemic Diam: 2.10 cm MV A velocity: 119.00 cm/s MV E/A ratio:  0.84 Zoila Shutter MD Electronically signed by Zoila Shutter MD Signature Date/Time: 02/17/2023/12:10:54 PM    Final    Medications:   amLODipine  10 mg Oral Daily    calcitRIOL  0.25 mcg Oral Q M,W,F   carvedilol  25 mg Oral BID WC   Chlorhexidine Gluconate Cloth  6 each Topical Q0600   gabapentin  300 mg Oral Daily   heparin  5,000 Units Subcutaneous Q8H   hydrALAZINE  50 mg Oral Q8H   insulin aspart  0-6 Units Subcutaneous TID WC   lidocaine  1 Application Topical Daily   loratadine  10 mg Oral QPM   multivitamin  1 tablet Oral QHS   polyethylene glycol  17 g Oral Daily   senna-docusate  1 tablet Oral BID   sevelamer carbonate  2,400 mg Oral TID WC   Tenapanor HCl (CKD)  1 tablet Oral BID   Vitamin D (Ergocalciferol)  50,000 Units Oral Q M,W,F    Dialysis Orders: MWF  - SW  4.25 hrs, BFR 400, DFR AF 1.5,  EDW 97kg, 2K/ 2Ca  Access: AVF  Heparin 5000 unit initial, 2000 unit intermittent Mircera 75 mcg q2wks - last 01/03/23 Hectorol IV qHD   Sensipar 180mg  QHD   Assessment/Plan:  Acute hypoxic respiratory failure -secondary to volume overload in this patient with large intradialytic weight gain and a missed dialysis treatment last week.  Underwent hemodialysis  with successful ultrafiltration and ability to improve oxygenation of supplementation, however he is having worsening orthopnea again today. Will plan for HD today with plan to reassess post HD to determine if he can discharge.  T Nausea/Vomiting -this is resolved with negative workup for acute intra-abdominal process.  Resumed medications for IBS.  ESRD -he is usually on a Monday/Wednesday/Friday schedule and underwent extra hemodialysis Saturday for volume unloading.  Next dialysis today. Will likely need lower EDW at discharge.   Hypertension  -blood pressures improved with resumption of antihypertensive therapy as well as ultrafiltration with hemodialysis. Elevated again today, monitor with UF.   Anemia of CKD -hemoglobin levels improved with ultrafiltration/hemodialysis indicating that initial low values may indeed have been part from dilution.  On ESA as outpatient.     Secondary Hyperparathyroidism -  Calcium in goal. Check phos. Continue home meds.   Nutrition - Renal diet w/fluid restrictions.   Rogers Blocker, PA-C 02/18/2023, 9:26 AM  [facility] Pager: [phone]

## 2023-02-18 NOTE — Plan of Care (Signed)
Plan of care is reviewed. Pt has been progressing. He is stable hemodynamically. No distress noted.  Problem: Fluid Volume: Goal: Ability to maintain a balanced intake and output will improve Outcome: Progressing   Problem: Education: Goal: Ability to describe self-care measures that may prevent or decrease complications (Diabetes Survival Skills Education) will improve Outcome: Progressing Goal: Individualized Educational Video(s) Outcome: Progressing   Problem: Health Behavior/Discharge Planning: Goal: Ability to identify and utilize available resources and services will improve Outcome: Progressing Goal: Ability to manage health-related needs will improve Outcome: Progressing   Problem: Tissue Perfusion: Goal: Adequacy of tissue perfusion will improve Outcome: Progressing   Problem: Activity: Goal: Risk for activity intolerance will decrease Outcome: Progressing   Problem: Clinical Measurements: Goal: Ability to maintain clinical measurements within normal limits will improve Outcome: Progressing Goal: Will remain free from infection Outcome: Progressing Goal: Diagnostic test results will improve Outcome: Progressing Goal: Respiratory complications will improve Outcome: Progressing Goal: Cardiovascular complication will be avoided Outcome: Progressing   Filiberto Pinks, RN

## 2023-02-18 NOTE — Progress Notes (Signed)
   02/18/23 1825  Vitals  Temp 98.4 F (36.9 C)  Pulse Rate 87  Resp 17  BP (!) 147/91  SpO2 96 %  O2 Device Room Air  Weight 95.5 kg  Type of Weight Post-Dialysis  Post Treatment  Dialyzer Clearance Lightly streaked  Hemodialysis Intake (mL) 0 mL  Liters Processed 84  Fluid Removed (mL) 3000 mL  Tolerated HD Treatment Yes   Received patient in bed to unit.  Alert and oriented.  Informed consent signed and in chart.   TX duration:3.5hrs  Patient tolerated well.  Transported back to the room  Alert, without acute distress.  Hand-off given to patient's nurse.   Access used: LAVF Access issues: none  Total UF removed: 3L Medication(s) given: none   [facility]

## 2023-02-18 NOTE — Progress Notes (Signed)
Pt receives out-pt HD at [facility] [address] [place] on MWF. Will assist as needed.   Olivia Canter Renal Navigator [phone]

## 2023-02-18 NOTE — Discharge Summary (Signed)
Name: [name] MRN: [id number] DOB: [birth year] 57 y.o. PCP: Vladimir Crofts, FNP  Date of Admission: 02/15/2023 10:05 PM Date of Discharge: 02/19/2023 1:50 PM Attending Physician: Dr. 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  For insulin-dependent type 2 diabetes Monitor  glucose 3 times a day What changed:  how much to take how to take this when to take this   albuterol (2.5 MG/3ML) 0.083% nebulizer solution Commonly known as: PROVENTIL Take 3 mLs (2.5 mg total) by nebulization every 6 (six) hours as needed for wheezing or shortness of breath.   albuterol 108 (90 Base) MCG/ACT inhaler Commonly known as: Ventolin HFA Inhale 2 puffs into the lungs every 6 (six) hours as needed for wheezing or shortness of breath.   amLODipine 10 MG tablet Commonly known as: NORVASC Take 1 tablet (10 mg total) by mouth daily.   Breztri Aerosphere 160-9-4.8 MCG/ACT Aero Generic drug: Budeson-Glycopyrrol-Formoterol Inhale 2 puffs into the lungs in the morning and at bedtime.   busPIRone 5 MG tablet Commonly known as: BUSPAR Take 5 mg by mouth 2 (two) times daily.   calcitRIOL 0.25 MCG capsule Commonly known as: ROCALTROL Take 1 capsule (0.25 mcg total) by mouth every Monday, Wednesday, and Friday.   camphor-menthol lotion Commonly known as: SARNA Apply topically 2 (two) times daily. What changed: how much to take   carvedilol 25 MG tablet Commonly known as: COREG Take 1  tablet (25 mg total) by mouth 2 (two) times daily with a meal.   gabapentin 300 MG capsule Commonly known as: NEURONTIN Take 1 capsule (300 mg total) by mouth daily.   glucose blood test strip Use as instructed For insulin-dependent type 2 diabetes Monitor  glucose 3 times a day What changed:  how much to take how to take this when to take this   hydrALAZINE 50 MG tablet Commonly known as: APRESOLINE Take 1 tablet (50 mg total) by mouth 3 (three) times daily.   HYDROmorphone 2 MG tablet Commonly known as: DILAUDID Take 0.5 tablets (1 mg total) by mouth every 4 (four) hours as needed for severe pain (pain score 7-10).   hydrOXYzine 25 MG tablet Commonly known as: ATARAX Take 25 mg by mouth daily.   levocetirizine 5 MG tablet Commonly known as: XYZAL Take 1 tablet (5 mg total) by mouth every evening.   lidocaine 2 % jelly Commonly known as: XYLOCAINE Apply 1 Application topically daily.   multivitamin Tabs tablet Take 1 tablet by mouth at bedtime.   polyethylene glycol powder 17 GM/SCOOP powder Commonly known as: GLYCOLAX/MIRALAX Mix as directed and take 1 capful (17 g) by mouth daily. Start taking on: February 20, 2023   Senna-S 8.6-50 MG tablet Generic drug: senna-docusate Take 1 tablet by mouth 2 (two) times daily.   sevelamer carbonate 800 MG tablet Commonly known as: RENVELA Take 3 tablets (2,400 mg total) by mouth 3 (three) times daily with meals.  traMADol 50 MG tablet Commonly known as: ULTRAM Take 1 tablet (50 mg total) by mouth every 12 (twelve) hours as needed for moderate pain (pain score 4-6).   Vitamin D (Ergocalciferol) 1.25 MG (50000 UNIT) Caps capsule Commonly known as: DRISDOL Take 50,000 Units by mouth See admin instructions. Take one tablet by mouth on Monday, Wednesdays and Fridays per patient   Xphozah 30 MG Tabs Generic drug: Tenapanor HCl (CKD) Take 1 tablet by mouth 2 (two) times daily.        Disposition and follow-up:    [name].[name] was discharged from [facility] in Good condition.  At the hospital follow up visit please address:  1.  Follow-up:  Severe symptomatic hypertension Hypertensive nephropathy Concern for worsening OSA - On home amlodipine and Coreg - Assess his BP and adjust medications/ doses as needed - Hydralazine added to his medication during this admission  - Please refer for a sleep study to assess need for CPAP as this might be contributing to his HTN    ESRD on HD MWF Anemia of CKD Secondary hyperparathyroidism - On HD MWF - Kindly ensure dialysis compliance    HFpEF - Echo done on 02/17/2023 showed an improved ejection fraction 60 to 65% with evidence of left ventricular hypertrophy and left ventricular diastolic parameters that was consistent with grade 1 diastolic dysfunction.Patient only take coreg for this due to ESRD complications with other GDMT.  - Please assess his HF symptoms and counsel appropriately   Mediastinal lymphadenopathy  - Patient had a follow up with [facility],initially for 11/11 but couldn't attend due to this hospitalization - Patient was asked to reschedule that appointment ,please ensure follow up is done.  Neuropathy - On home Gabapentin. Patient was continued on Gabapentin, no changes were made during this hospitalization  - Please assess his neuropathy and modify dose appropriately based on his ERSD   Chronic pain  Constipation  Patient has a history back pain on home tramadol. Patient was constipated during this admission but CT abdomen on 02/16/2023 didn't show any concerns for obstruction. Patient was started on Miralax and Senna which helped with his constipations. Patient was discharged with Miralax and Senna as needed. - Kindly assess his need continued bowel regimen and adjust approprietly   2.  Labs / imaging needed at time of follow-up: CBC and CMP  3.  Pending labs/ test needing follow-up: N/A  4.   Medication Changes ADDED  - Hydralazine 50 mg TID   - Senna  - Marilax   Follow-up Appointments: PCP and [facility]   Hospital Course by problem list:  Severe symptomatic hypertension Hypertensive nephropathy Patient has a long standing history of HTN on Amlodipine and Coreg. His blood pressures was consistently elevated during this admission. Hydralazine was added to his home medications to further control his blood pressure. He was also found to have proteinuria during this admission. His protein/Urine ration was 0.194 likely secondary to hypertensive nephropathy. Patient is counseled on the need to control his BP by adhering to his medication and on-time dialysis schedule. Patient will follow up with his nephrologist outpatient for further assessment and plan for his proteinuria.  ESRD on HD MWF Anemia of CKD Secondary hyperparathyroidism Had multiple HD sessions with significant improvement in his volume status.Patient was continued on his home Vitamin D and Renvela.   HFpEF Acute hypoxic respiratory failure  Patient presented with SOB, found to have acute hypoxic respiratory failure in the setting of hypertension secondary to  fluid overload that causes flash pulmonary edema. An ECHO done on 02/17/2023 showed  EF of 60-65%,severe left ventricular hypertrophy,Grade 1 diastolic dysfunction and aortic dilatation of about 39mm. IVC was normal. Patient was restarted on his home Coreg. His breathing significantly improved after multiple sessions of dialysis.   Chronic pain  Continued on home tramadol   IBS constipation Continued on home Tenapanor   Neuropathy Continued on home Gabapentin   Discharge Subjective: Patient was laying in bed eating breakfast this morning, in no acute distress. He reports he was able to have a successful dialysis session yesterday with no anxiety. He denies any abdominal pian, chest pain or any shortness of breath, says he feel much better than when  he fist came into the ED.   Discharge Exam:   Blood pressure (!) 170/92, pulse 85, temperature 98.4 F (36.9 C), temperature source Oral, resp. rate 18, height 5\' 7"  (1.702 m), weight 97 kg, SpO2 95%.  Constitutional:well-appearing man , sitting in bed, in no acute distress HENT: normocephalic atraumatic, mucous membranes moist Cardiovascular: regular rate and rhythm Pulmonary/Chest: normal work of breathing on room air, lungs clear to auscultation bilaterally. No crackles  Abdominal: soft, non-tender, non-distended. No fluid wave  Neurological: alert & oriented x 3 MSK: no gross abnormalities. No pitting edema Skin: warm and dry Psych: Normal mood and affect  Pertinent Labs, Studies, and Procedures:     Latest Ref Rng & Units 02/19/2023    5:37 AM 02/18/2023    3:37 AM 02/17/2023    8:23 AM  CBC  WBC 4.0 - 10.5 K/uL 7.6  8.3  7.5   Hemoglobin 13.0 - 17.0 g/dL 24.4  01.0  27.2   Hematocrit 39.0 - 52.0 % 34.6  32.7  33.9   Platelets 150 - 400 K/uL 288  325  313        Latest Ref Rng & Units 02/19/2023    5:37 AM 02/18/2023    3:37 AM 02/17/2023    8:23 AM  CMP  Glucose 70 - 99 mg/dL 536  644  99   BUN 6 - 20 mg/dL 26  44  32   Creatinine 0.61 - 1.24 mg/dL 0.34  7.42  5.95   Sodium 135 - 145 mmol/L 132  132  135   Potassium 3.5 - 5.1 mmol/L 4.3  3.6  3.8   Chloride 98 - 111 mmol/L 93  92  94   CO2 22 - 32 mmol/L 27  26  25    Calcium 8.9 - 10.3 mg/dL 9.8  63.8  75.6     CT ABDOMEN PELVIS WO CONTRAST  Result Date: 02/16/2023 CLINICAL DATA:  Acute abdominal pain. EXAM: CT ABDOMEN AND PELVIS WITHOUT CONTRAST TECHNIQUE: Multidetector CT imaging of the abdomen and pelvis was performed following the standard protocol without IV contrast. RADIATION DOSE REDUCTION: This exam was performed according to the departmental dose-optimization program which includes automated exposure control, adjustment of the mA and/or kV according to patient size and/or use of iterative reconstruction  technique. COMPARISON:  05/30/2022. FINDINGS: Lower chest: Trace bilateral pleural effusions identified interlobular septal thickening and ground-glass attenuation noted lower lung zones. Subsegmental atelectasis within the periphery of the lung bases Hepatobiliary: No focal liver abnormality. There is a suggestion of mild gallbladder wall thickening. No pericholecystic fluid. No gallstones identified. No bile duct dilatation. Pancreas: Unremarkable. No pancreatic ductal dilatation or surrounding inflammatory changes. Spleen: Normal in size without focal abnormality. Adrenals/Urinary Tract: Normal adrenal glands. Punctate stone scratch set several  punctate stones noted within the upper pole of left kidney and inferior pole of right kidney. Bilateral renal cortical volume loss, increased from previous exam. Several simple appearing right kidney cysts are noted. The largest arises off the inferior pole of the right kidney measuring 1.5 cm, image 43/3. No follow-up imaging recommended. No signs of hydronephrosis bilaterally. No ureteral calculi. Bladder appears normal. Stomach/Bowel: Stomach is normal. Appendix is normal. No pathologic dilatation of the large or small bowel loops. Scattered colonic diverticula without signs of acute diverticulitis. No bowel wall thickening or inflammatory change. Vascular/Lymphatic: Aortic atherosclerosis. No aneurysm. No signs of abdominopelvic adenopathy. Reproductive: Prostate is unremarkable. Other: No free fluid or fluid collections. No signs of pneumoperitoneum. Small broad-based fat containing umbilical hernia. Musculoskeletal: No acute or significant osseous findings. IMPRESSION: 1. No acute findings within the abdomen or pelvis. 2. Trace bilateral pleural effusions with interlobular septal thickening and ground-glass attenuation noted lower lung zones. Findings are favored to represent mild pulmonary edema. 3. Suggestion of mild gallbladder wall thickening. No gallstones  identified. If there is clinical concern for acute cholecystitis consider further evaluation with right upper quadrant ultrasound. 4. Bilateral, punctate nonobstructing renal calculi. 5. Colonic diverticulosis without signs of acute diverticulitis. 6. Small broad-based fat containing umbilical hernia. 7.  Aortic Atherosclerosis (ICD10-I70.0). Electronically Signed   By: Signa Kell M.D.   On: 02/16/2023 09:30   DG Abd 1 View  Result Date: 02/16/2023 CLINICAL DATA:  Nausea. EXAM: ABDOMEN - 1 VIEW COMPARISON:  None Available. FINDINGS: Lung bases show patchy airspace disease in enlargement of the cardiopericardial silhouette. No gaseous bowel dilatation visualized in the abdomen or pelvis. Visualized bony anatomy unremarkable. IMPRESSION: 1. No gaseous bowel dilatation. 2. Patchy bibasilar airspace disease. Electronically Signed   By: Kennith Center M.D.   On: 02/16/2023 08:17   DG Chest 2 View  Result Date: 02/16/2023 CLINICAL DATA:  Shortness of breath. EXAM: CHEST - 2 VIEW COMPARISON:  01/21/2023 FINDINGS: Cardiomegaly, equivocal E worsened. Small right pleural effusion. Small amount of fluid in the right minor fissure. Worsening pulmonary edema. No pneumothorax. IMPRESSION: 1. Worsening pulmonary edema and small right pleural effusion. 2. Cardiomegaly, equivocally worsened. Electronically Signed   By: Narda Rutherford M.D.   On: 02/16/2023 00:02     Discharge Instructions: Discharge Instructions     Call MD for:  difficulty breathing, headache or visual disturbances   Complete by: As directed    Call MD for:  extreme fatigue   Complete by: As directed    Call MD for:  persistant dizziness or light-headedness   Complete by: As directed    Call MD for:  persistant nausea and vomiting   Complete by: As directed    Call MD for:  severe uncontrolled pain   Complete by: As directed    Call MD for:  temperature >100.4   Complete by: As directed    Discharge instructions   Complete by: As  directed    Dear [name],  It was a pleasure taking care of you at [facility]. You were admitted for shortness of breath and treated for acute hypoxic respiratory failure because there was fluid in your lungs. This is likely because high blood pressure as you have not been able to complete all your dialysis sessions.  We are discharging you home now that you are doing better after multiple dialysis sessions. Please follow the  instructions below: 1) You have a history of End stage renal disease for which you dialyze  every Monday, wednesday and Friday.It is very important that you stick to this schedule and complete the dialysis sessions - Talk to your primary care doctor about medications to ease anxiety during your sessions - Bring pillows, cushions, or other supportive items to help with comfort during your sessions  2) You have a history of uncontrolled blood pressure. Uncontrolled hypertension can cause severe damage to your blood vessels and organs. It can also make it difficult to breath. You can continue your previous medications. But we are also adding a medication called Hydralazine, which you have taken before -  Hydralazine 50 mg; one pill every 8 hours  3) For your heart failure,we did an ultrasound of your heart and it showed your heart is pumping well but heart chamber are severely thickened which is affected how your heart fills. Ensuring that your high blood pressure is controlled by taking your medications and not missing your dialysis sections will go a long way to help decrease further damage to your heart.   4) You were scheduled with [facility] on 11/11 for your  mediastinal lymphadenopathy follow up. You couldn't make that appointment because you were in the hospital. PLEASE try to reschedule this appointment.It is very important that you do as we need to make sure that the enlarged lymph nodes in your chests improve. If not this will need more evaluation.  5)  You have a history of constipation treated with Tenapanor. We have added Miralax and Senna S to this regimen, which helped during this admission. If your stools become runny or liquid, continue the Tenapanor and discontinue the Senna S and Miralax.   Please make an appointment with your primary care doctor in the next 1-2 weeks to follow up with changes in medications and monitoring of your chronic medical conditions   Take care,  Dr. Kathleen Lime, MD   Dr. Morene Crocker, MD   Increase activity slowly   Complete by: As directed       Signed: Kathleen Lime, MD [facility] - PGY1 Pager: [phone] 02/19/2023, 1:50 PM    Please contact the on call pager after 5 pm and on weekends at [phone].

## 2023-02-18 NOTE — Progress Notes (Signed)
HD#2 SUBJECTIVE:  Patient Summary: [name] is a 57 y.o. with a pertinent PMH of ESRD, HTN,T2DM and OSA and mediastinal lymphadenopathy, who presented with worsening SOB and N/V  and admitted for acute hypoxic respiratory failure likely in the setting of HF excerebration   Overnight Events: Had an ECHO that showed enhanced echogenicity and severe thickening up to 2.1 cm of the myocardium concerning for infiltrative disease such as amyloidosis given history. Left ventricular ejection fraction, by estimation, is 60 to 65%. There is severe left ventricular hypertrophy. Left ventricular diastolic parameters are consistent with Grade I diastolic dysfunction (impaired relaxation). Elevated left ventricular end-diastolic pressure. Aortic dilatation noted. There is borderline dilatation of the ascending aorta, measuring 39 mm. The inferior vena cava is normal in size with greater than 50% respiratory variability, suggesting right atrial pressure of 3 mmHg.    Interim History:  Patient is seen at bedside this morning,laying in bed in no acute distress. Says he feel better but endorses orthopnea when he lays flat. He otherwise denies any chest pain, abdominal pain , nausea or vomiting.  OBJECTIVE:  Vital Signs: Vitals:   02/17/23 2202 02/18/23 0029 02/18/23 0256 02/18/23 0300  BP: (!) 149/90 (!) 171/100  (!) 182/92  Pulse: 77 84  83  Resp: 14 18  17   Temp:  98.2 F (36.8 C)  97.8 F (36.6 C)  TempSrc:  Oral  Oral  SpO2: 94% 96%  91%  Weight:   97 kg   Height:       Supplemental O2: Nasal Cannula SpO2: 91 % O2 Flow Rate (L/min): 1 L/min FiO2 (%): 30 %  Filed Weights   02/16/23 1451 02/17/23 0631 02/18/23 0256  Weight: 92 kg 96.2 kg 97 kg     Intake/Output Summary (Last 24 hours) at 02/18/2023 0507 Last data filed at 02/17/2023 2300 Gross per 24 hour  Intake 500 ml  Output --  Net 500 ml   Net IO Since Admission: -3,001.18 mL [02/18/23 0507]  Physical Exam:  Physical  Exam Constitutional:      Comments: Laying in bed in no acute distress  HENT:     Head: Normocephalic and atraumatic.  Cardiovascular:     Rate and Rhythm: Normal rate and regular rhythm.  Pulmonary:     Effort: Pulmonary effort is normal. No respiratory distress.     Breath sounds: No stridor. No wheezing or rhonchi.     Comments: On RA  Chest:     Chest wall: No tenderness.  Skin:    General: Skin is warm and dry.  Neurological:     General: No focal deficit present.     Mental Status: He is alert.  Psychiatric:        Mood and Affect: Mood normal.        Behavior: Behavior normal.     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 Patient however denies any chest pain or dizziness. - Continue coreg 25 mg BID - Continue hydralazine 50 mg TID - Amlodipine 10 mg Daily  #ESRD on HD  #Anemia of CKD #Secondary Hyperthyroidism  Patient is on HD MWF. Had a session yesterday. Plan is to repeat another session today for his routine MWF schedule. - Inpatient HD today per Nephrology and reassess after   - Nephrology recs appreciated - Continue Renvala, Vit D   #HFpEF (60-65%) Echo done on 02/17/2023 showed EF of 60-65%,severe left ventricular hypertrophy,Grade 1 diastolic dysfunction and aortic dilatation of about 39 mm.The inferior vena cava is normal in size with greater than 50% respiratory variability, suggesting right atrial pressure of 3 mmHg.  - Continue Coreg  #Abdominal pain #Nausea and vomiting #IBS N/V resolved.Negative intra-abdominal work up. Resume IBS medications  - Tenapanor.  #Mediastinal Lymphadenopathy -  Had an appointment at [facility] today. Will call to reschedule his appointment   CHRONIC CONDITIONS  Anemia of chronic disease : Hgb at 9.8 today. Stable. ESA outpatient T2DM - SSI Neuropathy - Gabapentin 300 mg Daily  Chronic Pain - Continue Tramadol 50 mg BID as needed   Best Practice: Diet: Renal diet  VTE: heparin injection 5,000 Units Start: 02/16/23 0600 Code: Full  DISPO: Anticipated discharge today to Home pending  Medical work up and treatment  .  Signature: Kathleen Lime , MD Internal Medicine Resident, PGY-1 [facility]  Pager: [phone] 5:07 AM, 02/18/2023   Please contact the on call pager after 5 pm and on weekends at [phone].

## 2023-02-18 NOTE — Plan of Care (Signed)

## 2023-02-19 ENCOUNTER — Other Ambulatory Visit (HOSPITAL_COMMUNITY): Payer: Self-pay

## 2023-02-19 DIAGNOSIS — I120 Hypertensive chronic kidney disease with stage 5 chronic kidney disease or end stage renal disease: Secondary | ICD-10-CM | POA: Diagnosis not present

## 2023-02-19 DIAGNOSIS — N186 End stage renal disease: Secondary | ICD-10-CM | POA: Diagnosis not present

## 2023-02-19 DIAGNOSIS — Z992 Dependence on renal dialysis: Secondary | ICD-10-CM | POA: Diagnosis not present

## 2023-02-19 LAB — CBC
HCT: 34.6 % — ABNORMAL LOW (ref 39.0–52.0)
Hemoglobin: 11.4 g/dL — ABNORMAL LOW (ref 13.0–17.0)
MCH: 30.3 pg (ref 26.0–34.0)
MCHC: 32.9 g/dL (ref 30.0–36.0)
MCV: 92 fL (ref 80.0–100.0)
Platelets: 288 10*3/uL (ref 150–400)
RBC: 3.76 MIL/uL — ABNORMAL LOW (ref 4.22–5.81)
RDW: 13.4 % (ref 11.5–15.5)
WBC: 7.6 10*3/uL (ref 4.0–10.5)
nRBC: 0 % (ref 0.0–0.2)

## 2023-02-19 LAB — RENAL FUNCTION PANEL
Albumin: 3.1 g/dL — ABNORMAL LOW (ref 3.5–5.0)
Anion gap: 12 (ref 5–15)
BUN: 26 mg/dL — ABNORMAL HIGH (ref 6–20)
CO2: 27 mmol/L (ref 22–32)
Calcium: 9.8 mg/dL (ref 8.9–10.3)
Chloride: 93 mmol/L — ABNORMAL LOW (ref 98–111)
Creatinine, Ser: 7.25 mg/dL — ABNORMAL HIGH (ref 0.61–1.24)
GFR, Estimated: 8 mL/min — ABNORMAL LOW (ref >60)
Glucose, Bld: 148 mg/dL — ABNORMAL HIGH (ref 70–99)
Phosphorus: 5.3 mg/dL — ABNORMAL HIGH (ref 2.5–4.6)
Potassium: 4.3 mmol/L (ref 3.5–5.1)
Sodium: 132 mmol/L — ABNORMAL LOW (ref 135–145)

## 2023-02-19 LAB — GLUCOSE, CAPILLARY
Glucose-Capillary: 116 mg/dL — ABNORMAL HIGH (ref 70–99)
Glucose-Capillary: 154 mg/dL — ABNORMAL HIGH (ref 70–99)
Glucose-Capillary: 159 mg/dL — ABNORMAL HIGH (ref 70–99)
Glucose-Capillary: 207 mg/dL — ABNORMAL HIGH (ref 70–99)

## 2023-02-19 LAB — HEPATITIS B SURFACE ANTIBODY, QUANTITATIVE: Hep B S AB Quant (Post): 1618 m[IU]/mL

## 2023-02-19 MED ORDER — HYDRALAZINE HCL 50 MG PO TABS
50.0000 mg | ORAL_TABLET | Freq: Three times a day (TID) | ORAL | 0 refills | Status: DC
  Filled 2023-02-19: qty 90, 30d supply, fill #0

## 2023-02-19 MED ORDER — POLYETHYLENE GLYCOL 3350 17 GM/SCOOP PO POWD
17.0000 g | Freq: Every day | ORAL | 0 refills | Status: AC
  Filled 2023-02-19: qty 238, 14d supply, fill #0

## 2023-02-19 MED ORDER — SENNOSIDES-DOCUSATE SODIUM 8.6-50 MG PO TABS
1.0000 | ORAL_TABLET | Freq: Two times a day (BID) | ORAL | 0 refills | Status: AC
  Filled 2023-02-19: qty 60, 30d supply, fill #0

## 2023-02-19 NOTE — Progress Notes (Signed)
[facility] Progress Note   Subjective:   Pt seen in room, laying flat and respirations are unlabored. He reports his breathing is improved. Denies CP, nausea, vomiting, edema.   Objective Vitals:   02/19/23 0900 02/19/23 1000 02/19/23 1100 02/19/23 1125  BP:    (!) 148/86  Pulse:    76  Resp: 17 19 19 20   Temp:    98.2 F (36.8 C)  TempSrc:    Oral  SpO2:    95%  Weight:      Height:       Physical Exam General: Alert male in NAD Heart:RRR, no murmurs, rubs or gallops Lungs: CTA bilaterally Abdomen: Soft, non-distended, +BS Extremities: No edema b/l lower extremities Dialysis Access:  LUE AVF + t/b  Additional Objective Labs: Basic Metabolic Panel: Recent Labs  Lab 02/17/23 0823 02/18/23 0337 02/19/23 0537  NA 135 132* 132*  K 3.8 3.6 4.3  CL 94* 92* 93*  CO2 25 26 27   GLUCOSE 99 171* 148*  BUN 32* 44* 26*  CREATININE 7.51* 9.89* 7.25*  CALCIUM 10.0 10.0 9.8  PHOS 6.6* 6.6* 5.3*   Liver Function Tests: Recent Labs  Lab 02/15/23 2242 02/16/23 0329 02/17/23 0823 02/18/23 0337 02/19/23 0537  AST 11* 10*  --   --   --   ALT 11 10  --   --   --   ALKPHOS 67 61  --   --   --   BILITOT 0.9 1.0  --   --   --   PROT 7.2 6.7  --   --   --   ALBUMIN 3.1* 2.9* 3.0* 3.0* 3.1*   Recent Labs  Lab 02/15/23 2242  LIPASE 81*   CBC: Recent Labs  Lab 02/15/23 2242 02/15/23 2250 02/16/23 0329 02/17/23 0823 02/18/23 0337 02/19/23 0537  WBC 8.1  --  7.7 7.5 8.3 7.6  NEUTROABS 6.7  --   --   --   --   --   HGB 10.3*   < > 9.8* 11.1* 10.6* 11.4*  HCT 31.6*   < > 30.8* 33.9* 32.7* 34.6*  MCV 95.2  --  94.8 93.9 93.2 92.0  PLT 281  --  276 313 325 288   < > = values in this interval not displayed.   Blood Culture    Component Value Date/Time   SDES BLOOD RIGHT ANTECUBITAL 11/30/2022 1726   SPECREQUEST  11/30/2022 1726    BOTTLES DRAWN AEROBIC AND ANAEROBIC Blood Culture adequate volume   CULT  11/30/2022 1726    NO GROWTH 5  DAYS Performed at [facility], [address], [place], [place] [address]    REPTSTATUS 12/05/2022 FINAL 11/30/2022 1726    Cardiac Enzymes: No results for input(s): "CKTOTAL", "CKMB", "CKMBINDEX", "TROPONINI" in the last 168 hours. CBG: Recent Labs  Lab 02/18/23 1103 02/18/23 1848 02/19/23 0034 02/19/23 0605 02/19/23 1130  GLUCAP 153* 116* 207* 154* 159*   Iron Studies: No results for input(s): "IRON", "TIBC", "TRANSFERRIN", "FERRITIN" in the last 72 hours. @lablastinr3 @ Studies/Results: No results found. Medications:   amLODipine  10 mg Oral Daily   calcitRIOL  0.25 mcg Oral Q M,W,F   carvedilol  25 mg Oral BID WC   Chlorhexidine Gluconate Cloth  6 each Topical Q0600   gabapentin  300 mg Oral Daily   heparin  5,000 Units Subcutaneous Q8H   hydrALAZINE  50 mg Oral Q8H   insulin aspart  0-6 Units Subcutaneous TID WC  lidocaine  1 Application Topical Daily   loratadine  10 mg Oral QPM   multivitamin  1 tablet Oral QHS   polyethylene glycol  17 g Oral Daily   senna-docusate  1 tablet Oral BID   sevelamer carbonate  2,400 mg Oral TID WC   Tenapanor HCl (CKD)  1 tablet Oral BID   Vitamin D (Ergocalciferol)  50,000 Units Oral Q M,W,F    Dialysis Orders: MWF  - SW  4.25 hrs, BFR 400, DFR AF 1.5,  EDW 97kg, 2K/ 2Ca   Access: AVF  Heparin 5000 unit initial, 2000 unit intermittent Mircera 75 mcg q2wks - last 01/03/23 Hectorol IV qHD   Sensipar 180mg  QHD    Assessment/Plan:  Acute hypoxic respiratory failure -secondary to volume overload in this patient with large intradialytic weight gain and a missed dialysis treatment last week.  Underwent hemodialysis  with successful ultrafiltration and ability to improve oxygenation of supplementation. His orthopnea has improved and respiratory status is stable on RA. New EDW 94.5kg.  Nausea/Vomiting -this is resolved with negative workup for acute intra-abdominal process.  Resumed medications for IBS.  ESRD -he is  usually on a Monday/Wednesday/Friday schedule and underwent extra hemodialysis Saturday for volume unloading.  Resume MWF schedule.   Hypertension  -blood pressures improved with resumption of antihypertensive therapy as well as ultrafiltration with hemodialysis.   Anemia of CKD -hemoglobin levels improved with ultrafiltration/hemodialysis indicating that initial low values may indeed have been part from dilution.  On ESA as outpatient.    Secondary Hyperparathyroidism -  Calcium in goal. Phos at goal. Continue home meds.   Nutrition - Renal diet w/fluid restrictions.     Rogers Blocker, PA-C 02/19/2023, 11:39 AM  [facility] Pager: [phone] \

## 2023-02-19 NOTE — Discharge Instructions (Signed)
Dear Mr Labrada,  It was a pleasure taking care of you at [facility]. You were admitted for shortness of breath and treated for acute hypoxic respiratory failure due heart failure exacerbation. We are discharging you home now that you are doing better after multiple dialysis sessions. Please follow the following instructions.  1) You have a history of End stage renal failure for which you dialyze every Monday, wednesday and Friday.It is very important that you stick to this schedule. Your body will retain fluids if you miss a session which can cause you to be short of breath and overall body discomfort.  2) You have a history of hypertension that hasn't been  adequately controlled. Uncontrolled hypertension can cause severe damage to your blood vessels and organs. We are adding a medication called Hydralazine to your existing blood pressure medications. Please take Hydralazine 50 mg  three times daily until you see you primary doctor of Nephrologist.  3) For your heart failure,we did an ultrasound of your heart and it showed your heart is pumping well but heart chambers are severely thickened which affects how your heart fills. Ensuring that your high blood pressure is controlled by taking your medications and not missing your dialysis sections will go a long way to help decrease further damage to your heart.   4) You were scheduled with [facility]ology on 11/11 for your  mediastinal lymphadenopathy follow up. You couldn't make that appointment because you were in the hospital. PLEASE try to reschedule this appointment.It is very important that you do!  Take care,  Dr. Kathleen Lime, MD

## 2023-02-19 NOTE — Care Management Important Message (Signed)
Important Message  Patient Details  Name: [name] MRN: [id number] Date of Birth: November 29, 1965   Important Message Given:  Yes - Medicare IM     Dorena Bodo 02/19/2023, 1:00 PM

## 2023-02-19 NOTE — Progress Notes (Signed)
Pt d/c today. Contacted [facility] to advise clinic of pt's d/c today and that pt should resume care tomorrow.   Olivia Canter Renal Navigator [phone]

## 2023-02-19 NOTE — Care Management Important Message (Signed)
Important Message  Patient Details  Name: [name] MRN: [id number] Date of Birth: 07/19/1965   Important Message Given:  Yes - Medicare IM  Patient discharge IM will be mailed to the patient home address.    Josef Tourigny 02/19/2023, 12:48 PM

## 2023-02-19 NOTE — Discharge Planning (Signed)
[facility] Patient Discharge Orders- [facility] CLINIC: [place] [facility]  Patient's name: Taimak Milhorn Admit/DC Dates: 02/15/2023 - 02/19/2023  Discharge Diagnoses: Severe symptomatic hypertension   Shortness of breath  Aranesp: Given: No   Date and amount of last dose: N/a  Last Hgb: 11.4 PRBC's Given: No Date/# of units: N/A ESA dose for discharge: mircera 50 mcg IV q 2 weeks  IV Iron dose at discharge: none  Heparin change: no  EDW Change: Yes New EDW: 94.5kg  [place] Change: no  Access intervention/Change: no Details:  Hectorol/Calcitriol change: no  Discharge Labs: Calcium 9.8 Phosphorus 5.3 Albumin 3.1 K+ 4.3  IV Antibiotics: no Details:  On Coumadin?: no Last INR: Next INR: Managed By:   OTHER/APPTS/LAB ORDERS:    D/C Meds to be reconciled by nurse after every discharge.  Completed By: Rogers Blocker, PA-C 02/19/2023, 12:59 PM  [facility] Pager: [phone]   Reviewed by: MD:______ RN_______

## 2023-02-19 NOTE — Care Management Important Message (Signed)
Important Message  Patient Details  Name: [name] MRN: [id number] Date of Birth: 04-26-1965   Important Message Given:  Yes - Medicare IM     Sherilyn Banker 02/19/2023, 2:33 PM

## 2023-02-19 NOTE — Progress Notes (Signed)
Discharge instructions given. Patient verbalized understanding and all questions were answered.  ?

## 2023-02-20 ENCOUNTER — Other Ambulatory Visit (HOSPITAL_COMMUNITY): Payer: Self-pay

## 2023-04-08 ENCOUNTER — Other Ambulatory Visit: Payer: Self-pay

## 2023-04-08 ENCOUNTER — Inpatient Hospital Stay (HOSPITAL_COMMUNITY)
Admission: EM | Admit: 2023-04-08 | Discharge: 2023-04-11 | DRG: 640 | Disposition: A | Discharge status: Home / Self Care | Payer: Medicare Other | Attending: Internal Medicine | Admitting: Internal Medicine

## 2023-04-08 ENCOUNTER — Emergency Department (HOSPITAL_COMMUNITY): Payer: Medicare Other

## 2023-04-08 ENCOUNTER — Encounter (HOSPITAL_COMMUNITY): Payer: Self-pay | Admitting: Emergency Medicine

## 2023-04-08 DIAGNOSIS — I5A Non-ischemic myocardial injury (non-traumatic): Secondary | ICD-10-CM | POA: Diagnosis present

## 2023-04-08 DIAGNOSIS — Z79899 Other long term (current) drug therapy: Secondary | ICD-10-CM

## 2023-04-08 DIAGNOSIS — E119 Type 2 diabetes mellitus without complications: Secondary | ICD-10-CM

## 2023-04-08 DIAGNOSIS — J45909 Unspecified asthma, uncomplicated: Secondary | ICD-10-CM | POA: Diagnosis present

## 2023-04-08 DIAGNOSIS — J9601 Acute respiratory failure with hypoxia: Secondary | ICD-10-CM | POA: Diagnosis not present

## 2023-04-08 DIAGNOSIS — N2581 Secondary hyperparathyroidism of renal origin: Secondary | ICD-10-CM | POA: Diagnosis present

## 2023-04-08 DIAGNOSIS — D631 Anemia in chronic kidney disease: Secondary | ICD-10-CM | POA: Insufficient documentation

## 2023-04-08 DIAGNOSIS — I5032 Chronic diastolic (congestive) heart failure: Secondary | ICD-10-CM | POA: Diagnosis present

## 2023-04-08 DIAGNOSIS — Z1152 Encounter for screening for COVID-19: Secondary | ICD-10-CM

## 2023-04-08 DIAGNOSIS — E8770 Fluid overload, unspecified: Principal | ICD-10-CM | POA: Diagnosis present

## 2023-04-08 DIAGNOSIS — Z6837 Body mass index (BMI) 37.0-37.9, adult: Secondary | ICD-10-CM

## 2023-04-08 DIAGNOSIS — E1140 Type 2 diabetes mellitus with diabetic neuropathy, unspecified: Secondary | ICD-10-CM | POA: Diagnosis present

## 2023-04-08 DIAGNOSIS — K219 Gastro-esophageal reflux disease without esophagitis: Secondary | ICD-10-CM | POA: Diagnosis present

## 2023-04-08 DIAGNOSIS — I480 Paroxysmal atrial fibrillation: Secondary | ICD-10-CM | POA: Diagnosis present

## 2023-04-08 DIAGNOSIS — G4733 Obstructive sleep apnea (adult) (pediatric): Secondary | ICD-10-CM | POA: Diagnosis present

## 2023-04-08 DIAGNOSIS — E872 Acidosis, unspecified: Secondary | ICD-10-CM | POA: Diagnosis present

## 2023-04-08 DIAGNOSIS — I160 Hypertensive urgency: Secondary | ICD-10-CM | POA: Diagnosis present

## 2023-04-08 DIAGNOSIS — N186 End stage renal disease: Secondary | ICD-10-CM | POA: Diagnosis present

## 2023-04-08 DIAGNOSIS — Z7951 Long term (current) use of inhaled steroids: Secondary | ICD-10-CM

## 2023-04-08 DIAGNOSIS — E66812 Obesity, class 2: Secondary | ICD-10-CM | POA: Diagnosis present

## 2023-04-08 DIAGNOSIS — I10 Essential (primary) hypertension: Secondary | ICD-10-CM | POA: Diagnosis present

## 2023-04-08 DIAGNOSIS — Z801 Family history of malignant neoplasm of trachea, bronchus and lung: Secondary | ICD-10-CM

## 2023-04-08 DIAGNOSIS — Z8249 Family history of ischemic heart disease and other diseases of the circulatory system: Secondary | ICD-10-CM

## 2023-04-08 DIAGNOSIS — E1122 Type 2 diabetes mellitus with diabetic chronic kidney disease: Secondary | ICD-10-CM | POA: Diagnosis present

## 2023-04-08 DIAGNOSIS — Z885 Allergy status to narcotic agent status: Secondary | ICD-10-CM

## 2023-04-08 DIAGNOSIS — Z794 Long term (current) use of insulin: Secondary | ICD-10-CM

## 2023-04-08 DIAGNOSIS — Z992 Dependence on renal dialysis: Secondary | ICD-10-CM

## 2023-04-08 DIAGNOSIS — I132 Hypertensive heart and chronic kidney disease with heart failure and with stage 5 chronic kidney disease, or end stage renal disease: Secondary | ICD-10-CM | POA: Diagnosis present

## 2023-04-08 LAB — COMPREHENSIVE METABOLIC PANEL
ALT: 9 U/L (ref 0–44)
AST: 14 U/L — ABNORMAL LOW (ref 15–41)
Albumin: 3.1 g/dL — ABNORMAL LOW (ref 3.5–5.0)
Alkaline Phosphatase: 67 U/L (ref 38–126)
Anion gap: 19 — ABNORMAL HIGH (ref 5–15)
BUN: 82 mg/dL — ABNORMAL HIGH (ref 6–20)
CO2: 17 mmol/L — ABNORMAL LOW (ref 22–32)
Calcium: 9.4 mg/dL (ref 8.9–10.3)
Chloride: 101 mmol/L (ref 98–111)
Creatinine, Ser: 14.69 mg/dL — ABNORMAL HIGH (ref 0.61–1.24)
GFR, Estimated: 4 mL/min — ABNORMAL LOW (ref >60)
Glucose, Bld: 128 mg/dL — ABNORMAL HIGH (ref 70–99)
Potassium: 4.3 mmol/L (ref 3.5–5.1)
Sodium: 137 mmol/L (ref 135–145)
Total Bilirubin: 0.6 mg/dL (ref 0.0–1.2)
Total Protein: 6.7 g/dL (ref 6.5–8.1)

## 2023-04-08 LAB — CBC WITH DIFFERENTIAL/PLATELET
Abs Immature Granulocytes: 0.05 10*3/uL (ref 0.00–0.07)
Basophils Absolute: 0 10*3/uL (ref 0.0–0.1)
Basophils Relative: 0 %
Eosinophils Absolute: 0 10*3/uL (ref 0.0–0.5)
Eosinophils Relative: 0 %
HCT: 34.1 % — ABNORMAL LOW (ref 39.0–52.0)
Hemoglobin: 10.6 g/dL — ABNORMAL LOW (ref 13.0–17.0)
Immature Granulocytes: 1 %
Lymphocytes Relative: 10 %
Lymphs Abs: 1.1 10*3/uL (ref 0.7–4.0)
MCH: 31.4 pg (ref 26.0–34.0)
MCHC: 31.1 g/dL (ref 30.0–36.0)
MCV: 100.9 fL — ABNORMAL HIGH (ref 80.0–100.0)
Monocytes Absolute: 0.7 10*3/uL (ref 0.1–1.0)
Monocytes Relative: 7 %
Neutro Abs: 8.9 10*3/uL — ABNORMAL HIGH (ref 1.7–7.7)
Neutrophils Relative %: 82 %
Platelets: 292 10*3/uL (ref 150–400)
RBC: 3.38 MIL/uL — ABNORMAL LOW (ref 4.22–5.81)
RDW: 14.6 % (ref 11.5–15.5)
WBC: 10.9 10*3/uL — ABNORMAL HIGH (ref 4.0–10.5)
nRBC: 0 % (ref 0.0–0.2)

## 2023-04-08 LAB — RESP PANEL BY RT-PCR (RSV, FLU A&B, COVID)  RVPGX2
Influenza A by PCR: NEGATIVE
Influenza B by PCR: NEGATIVE
Resp Syncytial Virus by PCR: NEGATIVE
SARS Coronavirus 2 by RT PCR: NEGATIVE

## 2023-04-08 LAB — HEPATITIS B SURFACE ANTIGEN: Hepatitis B Surface Ag: NONREACTIVE

## 2023-04-08 LAB — HIV ANTIBODY (ROUTINE TESTING W REFLEX): HIV Screen 4th Generation wRfx: NONREACTIVE

## 2023-04-08 LAB — MAGNESIUM: Magnesium: 3 mg/dL — ABNORMAL HIGH (ref 1.7–2.4)

## 2023-04-08 LAB — TROPONIN I (HIGH SENSITIVITY)
Troponin I (High Sensitivity): 101 ng/L (ref <18)
Troponin I (High Sensitivity): 98 ng/L — ABNORMAL HIGH (ref <18)

## 2023-04-08 LAB — BRAIN NATRIURETIC PEPTIDE: B Natriuretic Peptide: 846.2 pg/mL — ABNORMAL HIGH (ref 0.0–100.0)

## 2023-04-08 MED ORDER — INSULIN ASPART 100 UNIT/ML IJ SOLN
0.0000 [IU] | Freq: Three times a day (TID) | INTRAMUSCULAR | Status: DC
  Administered 2023-04-10: 1 [IU] via SUBCUTANEOUS

## 2023-04-08 MED ORDER — POLYETHYLENE GLYCOL 3350 17 G PO PACK: 17.0000 g | PACK | Freq: Every day | ORAL | Status: DC | PRN

## 2023-04-08 MED ORDER — CARVEDILOL 25 MG PO TABS
25.0000 mg | ORAL_TABLET | Freq: Two times a day (BID) | ORAL | Status: DC
  Administered 2023-04-09 – 2023-04-11 (×5): 25 mg via ORAL
  Filled 2023-04-08: qty 2
  Filled 2023-04-08: qty 1
  Filled 2023-04-08: qty 2
  Filled 2023-04-08 (×2): qty 1

## 2023-04-08 MED ORDER — ACETAMINOPHEN 325 MG PO TABS
650.0000 mg | ORAL_TABLET | Freq: Four times a day (QID) | ORAL | Status: DC | PRN
  Administered 2023-04-09: 650 mg via ORAL
  Filled 2023-04-08: qty 2

## 2023-04-08 MED ORDER — IPRATROPIUM-ALBUTEROL 0.5-2.5 (3) MG/3ML IN SOLN
3.0000 mL | Freq: Once | RESPIRATORY_TRACT | Status: AC
  Administered 2023-04-08: 3 mL via RESPIRATORY_TRACT
  Filled 2023-04-08: qty 3

## 2023-04-08 MED ORDER — NITROGLYCERIN IN D5W 200-5 MCG/ML-% IV SOLN
0.0000 ug/min | INTRAVENOUS | Status: DC
  Administered 2023-04-08: 5 ug/min via INTRAVENOUS
  Filled 2023-04-08: qty 250

## 2023-04-08 MED ORDER — AMLODIPINE BESYLATE 10 MG PO TABS
10.0000 mg | ORAL_TABLET | Freq: Every day | ORAL | Status: DC
  Administered 2023-04-09 – 2023-04-11 (×3): 10 mg via ORAL
  Filled 2023-04-08: qty 2
  Filled 2023-04-08 (×2): qty 1

## 2023-04-08 MED ORDER — CHLORHEXIDINE GLUCONATE CLOTH 2 % EX PADS
6.0000 | MEDICATED_PAD | Freq: Every day | CUTANEOUS | Status: DC
  Administered 2023-04-10: 6 via TOPICAL

## 2023-04-08 MED ORDER — HYDRALAZINE HCL 50 MG PO TABS
50.0000 mg | ORAL_TABLET | Freq: Three times a day (TID) | ORAL | Status: DC
  Administered 2023-04-09 – 2023-04-11 (×7): 50 mg via ORAL
  Filled 2023-04-08 (×3): qty 1
  Filled 2023-04-08 (×2): qty 2
  Filled 2023-04-08 (×2): qty 1

## 2023-04-08 MED ORDER — HYDRALAZINE HCL 25 MG PO TABS
50.0000 mg | ORAL_TABLET | Freq: Once | ORAL | Status: DC
  Filled 2023-04-08: qty 2

## 2023-04-08 MED ORDER — GABAPENTIN 300 MG PO CAPS
300.0000 mg | ORAL_CAPSULE | Freq: Every day | ORAL | Status: DC
  Administered 2023-04-09 – 2023-04-11 (×3): 300 mg via ORAL
  Filled 2023-04-08 (×3): qty 1

## 2023-04-08 MED ORDER — LABETALOL HCL 5 MG/ML IV SOLN
20.0000 mg | Freq: Once | INTRAVENOUS | Status: DC
  Filled 2023-04-08: qty 4

## 2023-04-08 MED ORDER — HEPARIN SODIUM (PORCINE) 5000 UNIT/ML IJ SOLN
5000.0000 [IU] | Freq: Three times a day (TID) | INTRAMUSCULAR | Status: DC
  Administered 2023-04-09 – 2023-04-11 (×6): 5000 [IU] via SUBCUTANEOUS
  Filled 2023-04-08 (×7): qty 1

## 2023-04-08 MED ORDER — ACETAMINOPHEN 650 MG RE SUPP: 650.0000 mg | Freq: Four times a day (QID) | RECTAL | Status: DC | PRN

## 2023-04-08 MED ORDER — SEVELAMER CARBONATE 800 MG PO TABS
2400.0000 mg | ORAL_TABLET | Freq: Three times a day (TID) | ORAL | Status: DC
  Administered 2023-04-09 – 2023-04-11 (×5): 2400 mg via ORAL
  Filled 2023-04-08 (×5): qty 3

## 2023-04-08 NOTE — H&P (Signed)
History and Physical    PatientFredi Sheahan [name] WGN:[id number] DOB: 01-03-1966 DOA: 04/08/2023 DOS: the patient was seen and examined on 04/08/2023 PCP: Vladimir Crofts, FNP  Patient coming from: Home  Chief Complaint: No chief complaint on file.  HPI: Clate Cauble is a 57 y.o. male with medical history significant of ESRD on HD MWF, chronic diastolic heart failure, paroxysmal atrial fibrillation not on anticoagulation, type 2 diabetes, hypertension, class II obesity, anemia of ESRD presenting with shortness of breath.  Patient reports history of ESRD on hemodialysis Monday, Wednesday, Friday.  He completed his last full dialysis session on 12/26.  He reports some miscommunication as he believed that his next dialysis session would be tomorrow due to holiday schedule.  However it appears that he missed his last dialysis session.  He reports progressively worsening shortness of breath since earlier today.  He reports feeling like he was unable to get enough oxygen in.  Denies any nausea, vomiting, fevers, chills, chest pain, palpitations, cough, abdominal pain, diarrhea, constipation.  Denies any recent sick contacts.  ED course: Initial vital signs showing elevated blood pressure to the 200s/110s and hypoxia which improved after being placed on 3L [place].  Due to continued increased work of breathing, he was placed on BiPAP.  CBC revealed mild leukocytosis, hemoglobin at baseline.  CMP showing anion gap metabolic acidosis and kidney function consistent with end-stage renal disease.  BNP elevated in the 800s.  Initial troponin elevated at 101.  EKG showing sinus rhythm.  Chest x-ray with pulmonary edema, worse on right side.  Patient was started on nitroglycerin drip due to elevated blood pressures.  Nephrology consulted given need for dialysis.  ED provider has added home hydralazine and as needed IV labetalol to help decrease blood pressure and get patient off of nitroglycerin drip so that he can be  dialyzed.  [facility] hospitalist asked to evaluate patient for admission.  Review of Systems: As mentioned in the history of present illness. All other systems reviewed and are negative. Past Medical History:  Diagnosis Date   Acute kidney injury (HCC) 12/28/2014   Adjustment disorder with mixed anxiety and depressed mood 12/28/2014   Asthma    CHF (congestive heart failure) (HCC)    CPAP (continuous positive airway pressure) dependence    Diabetes mellitus without complication (HCC)    DM (diabetes mellitus) type II controlled with renal manifestation (HCC) 12/28/2014   DM (diabetes mellitus) type II controlled, neurological manifestation (HCC) 12/28/2014   Encephalopathy, hypertensive 12/28/2014   GERD (gastroesophageal reflux disease)    Hypertension    Hypertensive emergency without congestive heart failure 12/27/2014   Pneumonia    Possible Panic disorder 12/28/2014   Renal disorder    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   Sleep apnea    Past Surgical History:  Procedure Laterality Date   AV FISTULA PLACEMENT Left 01/05/2020   Procedure: LEFT ARM BRACHIOCEPHALIC ARTERIOVENOUS (AV) FISTULA;  Surgeon: Chuck Hint, MD;  Location: [facility] OR;  Service: Vascular;  Laterality: Left;   EMPYEMA DRAINAGE Right 05/17/2017   Procedure: EMPYEMA DRAINAGE AND DECORTICATION;  Surgeon: Delight Ovens, MD;  Location: [facility] OR;  Service: Thoracic;  Laterality: Right;   FLEXIBLE BRONCHOSCOPY  05/17/2017   Procedure: FLEXIBLE BRONCHOSCOPY;  Surgeon: Delight Ovens, MD;  Location: [facility] OR;  Service: Thoracic;;   INSERTION OF DIALYSIS CATHETER Right 12/12/2021   Procedure: INSERTION OF RIGHT INTERNAL JUGULAR DIALYSIS CATHETER;  Surgeon: Chuck Hint, MD;  Location: [facility] OR;  Service: Vascular;  Laterality:  Right;   IR FLUORO GUIDE CV LINE RIGHT  01/01/2020   IR THORACENTESIS ASP PLEURAL SPACE W/IMG GUIDE  05/17/2017   IR US GUIDE VASC ACCESS RIGHT  01/01/2020   NO PAST SURGERIES     REVISON OF  ARTERIOVENOUS FISTULA Left 12/12/2021   Procedure: REVISION OF LEFT ARM FISTULA BY PLICATION;  Surgeon: Chuck Hint, MD;  Location: [facility] OR;  Service: Vascular;  Laterality: Left;   THORACOTOMY/LOBECTOMY Right 05/17/2017   Procedure: RIGHT MINI THORACOTOMY;  Surgeon: Delight Ovens, MD;  Location: [facility] OR;  Service: Thoracic;  Laterality: Right;   VIDEO ASSISTED THORACOSCOPY (VATS)/EMPYEMA Right 05/17/2017   Procedure: RIGHT VIDEO ASSISTED THORACOSCOPY (VATS)/EMPYEMA;  Surgeon: Delight Ovens, MD;  Location: [facility] OR;  Service: Thoracic;  Laterality: Right;   Social History:  reports that he has never smoked. He has never been exposed to tobacco smoke. He has never used smokeless tobacco. He reports that he does not drink alcohol and does not use drugs.  Allergies  Allergen Reactions   Oxycodone Itching    Family History  Problem Relation Age of Onset   Hypertension Mother    Lung cancer Father     Prior to Admission medications   Medication Sig Start Date End Date Taking? Authorizing Provider  Accu-Chek Softclix Lancets lancets Use as directed.  For insulin-dependent type 2 diabetes Monitor  glucose 3 times a day Patient taking differently: 1 each by Other route See admin instructions. Use as directed.  For insulin-dependent type 2 diabetes Monitor  glucose 3 times a day 01/06/20   Albertine Grates, MD  albuterol (PROVENTIL) (2.5 MG/3ML) 0.083% nebulizer solution Take 3 mLs (2.5 mg total) by nebulization every 6 (six) hours as needed for wheezing or shortness of breath. 09/25/22   Alfonse Spruce, MD  albuterol (VENTOLIN HFA) 108 (90 Base) MCG/ACT inhaler Inhale 2 puffs into the lungs every 6 (six) hours as needed for wheezing or shortness of breath. 09/25/22   Alfonse Spruce, MD  amLODipine (NORVASC) 10 MG tablet Take 1 tablet (10 mg total) by mouth daily. 05/24/22   Rocky Morel, DO  Budeson-Glycopyrrol-Formoterol (BREZTRI AEROSPHERE) 160-9-4.8 MCG/ACT AERO Inhale 2  puffs into the lungs in the morning and at bedtime. 10/29/22   [provider]  busPIRone (BUSPAR) 5 MG tablet Take 5 mg by mouth 2 (two) times daily. 02/05/23   [provider]  calcitRIOL (ROCALTROL) 0.25 MCG capsule Take 1 capsule (0.25 mcg total) by mouth every Monday, Wednesday, and Friday. 01/08/20   Albertine Grates, MD  camphor-menthol [facility]) lotion Apply topically 2 (two) times daily. Patient taking differently: Apply 1 Application topically 2 (two) times daily. 12/03/22   Lockie Mola, MD  carvedilol (COREG) 25 MG tablet Take 1 tablet (25 mg total) by mouth 2 (two) times daily with a meal. 05/24/22   Rocky Morel, DO  gabapentin (NEURONTIN) 300 MG capsule Take 1 capsule (300 mg total) by mouth daily. 01/25/23   Peterson Ao, MD  glucose blood test strip Use as instructed For insulin-dependent type 2 diabetes Monitor  glucose 3 times a day Patient taking differently: 1 each by Other route See admin instructions. Use as instructed For insulin-dependent type 2 diabetes Monitor  glucose 3 times a day 01/06/20   Albertine Grates, MD  hydrALAZINE (APRESOLINE) 50 MG tablet Take 1 tablet (50 mg total) by mouth 3 (three) times daily. 02/19/23 03/21/23  Morene Crocker, MD  HYDROmorphone (DILAUDID) 2 MG tablet Take 0.5 tablets (1 mg total) by  mouth every 4 (four) hours as needed for severe pain (pain score 7-10). 01/24/23   Peterson Ao, MD  hydrOXYzine (ATARAX) 25 MG tablet Take 25 mg by mouth daily. 02/08/23   [provider]  levocetirizine (XYZAL) 5 MG tablet Take 1 tablet (5 mg total) by mouth every evening. 09/25/22   Alfonse Spruce, MD  lidocaine (XYLOCAINE) 2 % jelly Apply 1 Application topically daily. 11/26/22   Alvira Monday, MD  multivitamin (RENA-VIT) TABS tablet Take 1 tablet by mouth at bedtime. 05/24/22   Rocky Morel, DO  polyethylene glycol powder (GLYCOLAX/MIRALAX) 17 GM/SCOOP powder Mix as directed and take 1 capful (17 g) by mouth daily.  02/20/23   Morene Crocker, MD  sevelamer carbonate (RENVELA) 800 MG tablet Take 3 tablets (2,400 mg total) by mouth 3 (three) times daily with meals. 12/03/22   Lockie Mola, MD  traMADol (ULTRAM) 50 MG tablet Take 1 tablet (50 mg total) by mouth every 12 (twelve) hours as needed for moderate pain (pain score 4-6). 01/24/23   Peterson Ao, MD  Vitamin D, Ergocalciferol, (DRISDOL) 1.25 MG (50000 UNIT) CAPS capsule Take 50,000 Units by mouth See admin instructions. Take one tablet by mouth on Monday, Wednesdays and Fridays per patient 08/17/19   [provider]  XPHOZAH 30 MG TABS Take 1 tablet by mouth 2 (two) times daily. 01/15/23   [provider]  insulin aspart protamine - aspart (NOVOLOG MIX 70/30 FLEXPEN) (70-30) 100 UNIT/ML FlexPen Inject 15-30 Units into the skin 2 (two) times daily.  05/24/22  [provider]    Physical Exam: Vitals:   04/08/23 1820 04/08/23 1830 04/08/23 1833 04/08/23 1840  BP: (!) 185/111 (!) 189/96  (!) 183/94  Pulse: 86 87  88  Resp: 18 17  17   Temp:   97.9 F (36.6 C)   TempSrc:   Axillary   SpO2: 100% 100%  100%   Physical Exam Constitutional:      Appearance: He is obese.     Comments: Chronically ill-appearing middle aged male, on BiPAP.  HENT:     Head: Normocephalic and atraumatic.     Mouth/Throat:     Mouth: Mucous membranes are dry.     Pharynx: Oropharynx is clear. No oropharyngeal exudate.  Eyes:     General: No scleral icterus.    Extraocular Movements: Extraocular movements intact.     Pupils: Pupils are equal, round, and reactive to light.  Cardiovascular:     Rate and Rhythm: Normal rate and regular rhythm.     Pulses: Normal pulses.     Heart sounds: Normal heart sounds. No murmur heard.    No friction rub. No gallop.  Pulmonary:     Breath sounds: No wheezing or rhonchi.     Comments: Crackles noted at bilateral bases. Saturating well on BiPAP, FiO2 50%. Work of breathing has  improved. Abdominal:     General: Bowel sounds are normal. There is no distension.     Palpations: Abdomen is soft.     Tenderness: There is no abdominal tenderness. There is no guarding or rebound.  Musculoskeletal:        General: Normal range of motion.     Cervical back: Normal range of motion.     Comments: 1+ nonpitting edema in bilateral lower extremities  Skin:    General: Skin is warm and dry.  Neurological:     General: No focal deficit present.     Mental Status: He is alert and oriented  to person, place, and time.  Psychiatric:        Mood and Affect: Mood normal.        Behavior: Behavior normal.     Data Reviewed:  There are no new results to review at this time.  Assessment and Plan: No notes have been filed under this hospital service. Service: Hospitalist  Acute hypoxic respiratory failure secondary to volume overload ESRD on HD Monday Wednesday Friday Anion gap metabolic acidosis secondary to renal failure Patient presenting to the ED with acute hypoxic respiratory failure secondary to volume overload in the setting of missed HD.  He had his last full dialysis session on 12/26 and thus has not received dialysis in about 5 days.  BNP elevated and pulmonary edema noted on chest x-ray.  He was placed on BiPAP to help with increased work of breathing, appears better on my evaluation.  Nephrology consulted for inpatient dialysis today.  Anticipate improvement in symptoms with dialysis.  Can likely also remove BiPAP during or after dialysis pending clinical improvement. -Nephrology following, appreciate assistance -HD today -Stop BiPAP during or after HD pending clinical improvement -Supplemental oxygen as needed to maintain O2 sats greater than 90% -Trend renal function -Renal diet with fluid restriction -Place in observation  Type II NSTEMI Initial troponin elevated to 101.  EKG showing sinus rhythm.  Suspect elevated troponin is secondary to demand ischemia  from volume overload.  Will trend troponin until peaks. -Trend troponin until peaks -Management of volume overload as noted above  Chronic diastolic heart failure Echocardiogram last month showing LVEF 60 to 65% and grade 1 diastolic dysfunction.  Given ESRD status, his volume is primarily managed via dialysis.  He is on Coreg 25 mg twice daily, Norvasc 10 mg daily, hydralazine 50 mg 3 times daily at home. -Resume home Norvasc, hydralazine, Coreg -Volume management with HD as noted above  Severe uncontrolled hypertension Patient presented with blood pressures ranging in the 200s/100s.  Initially started on nitroglycerin drip per ED provider but was weaned off with addition of hydralazine and as needed labetalol.  Expect better blood pressure control after HD.  Will restart his home hydralazine, Norvasc, Coreg for continued blood pressure control in addition to receiving HD. -HD today -Resume home Norvasc, hydralazine, Coreg  Type 2 diabetes with neuropathy Last A1c 7.2% in 11/2022.  Not on any medications for glycemic control at home. -Follow-up A1c -Very sensitive sliding scale insulin -Trend CBGs, goal 140-180 -Continue home gabapentin 300 mg daily  Hypermagnesemia Magnesium 3.0 in the ED today.  Anticipate resolution with hemodialysis. -HD today -Repeat Mg level tomorrow  Anemia of ESRD Baseline hemoglobin around 10.5-11.  On admission hemoglobin 10.6.  Stable.  On ESA as an outpatient.  Paroxysmal atrial fibrillation On Coreg at home for rate control.  He is not on any anticoagulation.  EKG currently showing sinus rhythm. -Continue home Coreg   Advance Care Planning:   Code Status: Full Code   Consults: nephrology  Family Communication: no family at bedside  Severity of Illness: The appropriate patient status for this patient is OBSERVATION. Observation status is judged to be reasonable and necessary in order to provide the required intensity of service to ensure the  patient's safety. The patient's presenting symptoms, physical exam findings, and initial radiographic and laboratory data in the context of their medical condition is felt to place them at decreased risk for further clinical deterioration. Furthermore, it is anticipated that the patient will be medically stable for discharge from the  hospital within 2 midnights of admission.   Author: Briscoe Burns, MD 04/08/2023 6:56 PM  For on call review [email or web address].

## 2023-04-08 NOTE — ED Triage Notes (Signed)
Pt BIB [facility] for SOB w/CP.  PT is Dialysis MWF pt and had last tx on Thurs 12/26 d/t Dialysis being closed on Christmas. Pt was  89 % RA 95% 2L.   PT is A&O x4.  220/130 89% RA, 95 2L,   EMS gave 2 SL nitro

## 2023-04-08 NOTE — ED Provider Notes (Signed)
Patient here shortness of breath.  History of end-stage renal disease on dialysis.  Last dialysis session was about 5 days ago.  He arrives hypertensive and hypoxic.  I think may be flash pulmonary edema likely.  He does have some reactive airway disease and will do DuoNeb treatment, BiPAP, nitroglycerin given his blood pressure is 220/100.  He is at increased work of breathing on exam.  I do suspect that this is all volume overload.  Not having any chest pain.  EKG per my review interpretation shows sinus rhythm.  Overall we will get blood work and anticipate talking with nephrology and admitted to medicine for dialysis and medical optimization.  Please see resident note for further results, evaluation, disposition of the patient.  Patient on BiPAP nitroglycerin.  New hypoxia likely in setting of volume overload.  This chart was dictated using voice recognition software.  Despite best efforts to proofread,  errors can occur which can change the documentation meaning.    .Critical Care  Performed by: Virgina Norfolk, DO Authorized by: Virgina Norfolk, DO   Critical care provider statement:    Critical care time (minutes):  40   Critical care was necessary to treat or prevent imminent or life-threatening deterioration of the following conditions:  Respiratory failure   Critical care was time spent personally by me on the following activities:  Blood draw for specimens, development of treatment plan with patient or surrogate, discussions with primary provider, evaluation of patient's response to treatment, examination of patient, obtaining history from patient or surrogate, ordering and review of laboratory studies, ordering and performing treatments and interventions, ordering and review of radiographic studies and pulse oximetry   I assumed direction of critical care for this patient from another provider in my specialty: no     Care discussed with: admitting provider       Virgina Norfolk,  DO 04/08/23 1654

## 2023-04-08 NOTE — Consult Note (Signed)
Reason for Consult:ESRD Referring Physician: Dr. Gunnar Bulla  Chief Complaint: Shortness of breath and chest pain   Dialysis Orders:  MWF  - SW  4.25 hrs, BFR 400, DFR AF 1.5,  EDW 95.5 kg, 2K/ 2Ca   Access: AVF  Heparin 5000 unit initial, 2000 unit intermittent Mircera 75 mcg q2wks - last 01/03/23 Hectorol IV qHD   Sensipar 180mg  QHD  Assessment/Plan: Acute hypoxic respiratory failure - pulmonary edema on CXR, 2/2 missed HD + large fluid gains.  Plan for extra/make up HD today.  Counseled on fluid restrictions.  For some reason he was dialyzed Thursday instead of Friday and he also missed his treatment Sunday on the holiday schedule.  Planning on dialysis tonight emergently and I have already spoken with the dialysis staff. Chest Pain-likely secondary to demand ischemia. Per PMD  ESRD -  on MWF.  Extra/make up HD today, will have to be off nitro drip to come upstairs.   Hypertension  - Severe HTN in ED, on nitro drip.   Anemia of CKD - Hgb 10.6 on ESA as outpatient.    Secondary Hyperparathyroidism -  Calcium in goal. Check phos. Continue home meds.   Nutrition - Renal diet w/fluid restrictions.     HPI: [name] is an 57 y.o. male ESRD, HTN, HL, T2DM, neuropathy, OSA and chronic L leg wound presented to the ED with worsening SOB. Last dialysis treatment was on 12/26 (he did received dialysis on 12/22 and 12/24). He usually signs off 45 minutes early but on 12/26 signed off 1hr15 min early leaving at 101 kg. He also complained of generalized chest pain. He was found to be hypertensive at 203/118, tachypneic, with saturations with movement to the 85% range. Patient denies sick contacts, hemoptysis, nausea vomiting.   Over the past 3 weeks the lowest he's left dialysis is 97kg, usually leaving in the 99-101kg range with ~3-4kg of IDWG. He consistently signs off early 45 minutes and unfortunately signed off 1hr 15 minutes early on his last treatment on Thursday. Appears that he had  the Friday session moved to Thursday.  He also missed dialysis on Sunday, he just forgot.  He denies any fever chills nausea, sore throat, sick contacts or myalgias.  ROS Pertinent items are noted in HPI.  Chemistry and CBC: Creatinine, Ser  Date/Time Value Ref Range Status  04/08/2023 03:31 PM 14.69 (H) 0.61 - 1.24 mg/dL Final  82/95/6213 08:65 AM 7.25 (H) 0.61 - 1.24 mg/dL Final  78/46/9629 52:84 AM 9.89 (H) 0.61 - 1.24 mg/dL Final  13/24/4010 27:25 AM 7.51 (H) 0.61 - 1.24 mg/dL Final  36/64/4034 74:25 AM 9.31 (H) 0.61 - 1.24 mg/dL Final  95/63/8756 43:32 PM 8.93 (H) 0.61 - 1.24 mg/dL Final  95/18/8416 60:63 AM 8.35 (H) 0.61 - 1.24 mg/dL Final  01/60/1093 23:55 AM 7.45 (H) 0.61 - 1.24 mg/dL Final  73/22/0254 27:06 AM 12.22 (H) 0.61 - 1.24 mg/dL Final  23/76/2831 51:76 AM 11.55 (H) 0.61 - 1.24 mg/dL Final  16/10/3708 62:69 AM 9.46 (H) 0.61 - 1.24 mg/dL Final  48/54/6270 35:00 AM 7.03 (H) 0.61 - 1.24 mg/dL Final  93/81/8299 37:16 PM 5.62 (H) 0.61 - 1.24 mg/dL Final  96/78/9381 01:75 PM 9.12 (H) 0.61 - 1.24 mg/dL Final  01/31/8526 78:24 AM 6.15 (H) 0.61 - 1.24 mg/dL Final  23/53/6144 31:54 AM 8.76 (H) 0.61 - 1.24 mg/dL Final  00/86/7619 50:93 PM 13.36 (H) 0.61 - 1.24 mg/dL Final  26/71/2458 09:98 PM 12.93 (H) 0.61 - 1.24  mg/dL Final  23/55/7322 02:54 AM 6.90 (H) 0.61 - 1.24 mg/dL Final  27/09/2374 28:31 AM 5.79 (H) 0.61 - 1.24 mg/dL Final  51/76/1607 37:10 AM 8.58 (H) 0.61 - 1.24 mg/dL Final  62/69/4854 62:70 AM 10.59 (H) 0.61 - 1.24 mg/dL Final  35/00/9381 82:99 AM 14.89 (H) 0.61 - 1.24 mg/dL Final  37/16/9678 93:81 AM 13.64 (H) 0.61 - 1.24 mg/dL Final  01/75/1025 85:27 AM 15.25 (H) 0.61 - 1.24 mg/dL Final  78/24/2353 61:44 PM 14.48 (H) 0.61 - 1.24 mg/dL Final  31/54/0086 76:19 PM 14.10 (H) 0.61 - 1.24 mg/dL Final  50/93/2671 24:58 PM 12.72 (H) 0.61 - 1.24 mg/dL Final  09/98/3382 50:53 AM 15.10 (H) 0.61 - 1.24 mg/dL Final  97/67/3419 37:90 AM 11.41 (H) 0.61 - 1.24 mg/dL Final   24/12/7351 29:92 AM 5.25 (H) 0.61 - 1.24 mg/dL Final  42/68/3419 62:22 PM 3.79 (H) 0.61 - 1.24 mg/dL Final    Comment:    DELTA CHECK NOTED DIALYSIS   01/06/2020 07:57 AM 7.39 (H) 0.61 - 1.24 mg/dL Final  97/98/9211 94:17 PM 7.41 (H) 0.61 - 1.24 mg/dL Final  40/81/4481 85:63 AM 9.49 (H) 0.61 - 1.24 mg/dL Final  14/97/0263 78:58 AM 10.73 (H) 0.61 - 1.24 mg/dL Final  85/05/7739 28:78 AM 10.50 (H) 0.61 - 1.24 mg/dL Final  67/67/2094 70:96 PM 10.49 (H) 0.61 - 1.24 mg/dL Final  28/36/6294 76:54 AM 10.43 (H) 0.61 - 1.24 mg/dL Final  65/06/5463 68:12 AM 8.23 (H) 0.61 - 1.24 mg/dL Final  75/17/0017 49:44 AM 7.69 (H) 0.61 - 1.24 mg/dL Final  96/75/9163 84:66 PM 7.85 (H) 0.61 - 1.24 mg/dL Final  59/93/5701 77:93 PM 4.05 (H) 0.61 - 1.24 mg/dL Final  90/30/0923 30:07 AM 3.12 (H) 0.61 - 1.24 mg/dL Final  62/26/3335 45:62 AM 3.99 (H) 0.61 - 1.24 mg/dL Final  56/38/9373 42:87 PM 4.29 (H) 0.61 - 1.24 mg/dL Final  68/02/5725 20:35 PM 4.30 (H) 0.61 - 1.24 mg/dL Final  59/74/1638 45:36 AM 2.16 (H) 0.61 - 1.24 mg/dL Final  46/80/3212 24:82 AM 2.26 (H) 0.61 - 1.24 mg/dL Final  50/06/7046 88:91 AM 2.30 (H) 0.61 - 1.24 mg/dL Final  69/45/0388 82:80 AM 2.65 (H) 0.61 - 1.24 mg/dL Final  03/49/1791 50:56 AM 3.00 (H) 0.61 - 1.24 mg/dL Final   Recent Labs  Lab 04/08/23 1531  NA 137  K 4.3  CL 101  CO2 17*  GLUCOSE 128*  BUN 82*  CREATININE 14.69*  CALCIUM 9.4   Recent Labs  Lab 04/08/23 1531  WBC 10.9*  NEUTROABS 8.9*  HGB 10.6*  HCT 34.1*  MCV 100.9*  PLT 292   Liver Function Tests: Recent Labs  Lab 04/08/23 1531  AST 14*  ALT 9  ALKPHOS 67  BILITOT 0.6  PROT 6.7  ALBUMIN 3.1*   No results for input(s): "LIPASE", "AMYLASE" in the last 168 hours. No results for input(s): "AMMONIA" in the last 168 hours. Cardiac Enzymes: No results for input(s): "CKTOTAL", "CKMB", "CKMBINDEX", "TROPONINI" in the last 168 hours. Iron Studies: No results for input(s): "IRON", "TIBC", "TRANSFERRIN",  "FERRITIN" in the last 72 hours. PT/INR: @LABRCNTIP (inr:5)  Xrays/Other Studies: ) Results for orders placed or performed during the hospital encounter of 04/08/23 (from the past 48 hours)  Resp panel by RT-PCR (RSV, Flu A&B, Covid) Anterior Nasal Swab     Status: None   Collection Time: 04/08/23  3:10 PM   Specimen: Anterior Nasal Swab  Result Value Ref Range   SARS Coronavirus 2 by RT PCR NEGATIVE NEGATIVE  Influenza A by PCR NEGATIVE NEGATIVE   Influenza B by PCR NEGATIVE NEGATIVE    Comment: (NOTE) The Xpert Xpress SARS-CoV-2/FLU/RSV plus assay is intended as an aid in the diagnosis of influenza from Nasopharyngeal swab specimens and should not be used as a sole basis for treatment. Nasal washings and aspirates are unacceptable for Xpert Xpress SARS-CoV-2/FLU/RSV testing.  Fact Sheet for Patients: BloggerCourse.com  Fact Sheet for Healthcare Providers: SeriousBroker.it  This test is not yet approved or cleared by the [place] FDA and has been authorized for detection and/or diagnosis of SARS-CoV-2 by FDA under an Emergency Use Authorization (EUA). This EUA will remain in effect (meaning this test can be used) for the duration of the COVID-19 declaration under Section 564(b)(1) of the Act, 21 U.S.C. section 360bbb-3(b)(1), unless the authorization is terminated or revoked.     Resp Syncytial Virus by PCR NEGATIVE NEGATIVE    Comment: (NOTE) Fact Sheet for Patients: BloggerCourse.com  Fact Sheet for Healthcare Providers: SeriousBroker.it  This test is not yet approved or cleared by the [place] FDA and has been authorized for detection and/or diagnosis of SARS-CoV-2 by FDA under an Emergency Use Authorization (EUA). This EUA will remain in effect (meaning this test can be used) for the duration of the COVID-19 declaration under Section 564(b)(1) of the Act, 21  U.S.C. section 360bbb-3(b)(1), unless the authorization is terminated or revoked.  Performed at [facility], [address], [place], [place] [address]   CBC with Differential     Status: Abnormal   Collection Time: 04/08/23  3:31 PM  Result Value Ref Range   WBC 10.9 (H) 4.0 - 10.5 K/uL   RBC 3.38 (L) 4.22 - 5.81 MIL/uL   Hemoglobin 10.6 (L) 13.0 - 17.0 g/dL   HCT 32.4 (L) 40.1 - 02.7 %   MCV 100.9 (H) 80.0 - 100.0 fL   MCH 31.4 26.0 - 34.0 pg   MCHC 31.1 30.0 - 36.0 g/dL   RDW 25.3 66.4 - 40.3 %   Platelets 292 150 - 400 K/uL   nRBC 0.0 0.0 - 0.2 %   Neutrophils Relative % 82 %   Neutro Abs 8.9 (H) 1.7 - 7.7 K/uL   Lymphocytes Relative 10 %   Lymphs Abs 1.1 0.7 - 4.0 K/uL   Monocytes Relative 7 %   Monocytes Absolute 0.7 0.1 - 1.0 K/uL   Eosinophils Relative 0 %   Eosinophils Absolute 0.0 0.0 - 0.5 K/uL   Basophils Relative 0 %   Basophils Absolute 0.0 0.0 - 0.1 K/uL   Immature Granulocytes 1 %   Abs Immature Granulocytes 0.05 0.00 - 0.07 K/uL    Comment: Performed at [facility], [address] [place], [place] [address]  Comprehensive metabolic panel     Status: Abnormal   Collection Time: 04/08/23  3:31 PM  Result Value Ref Range   Sodium 137 135 - 145 mmol/L   Potassium 4.3 3.5 - 5.1 mmol/L   Chloride 101 98 - 111 mmol/L   CO2 17 (L) 22 - 32 mmol/L   Glucose, Bld 128 (H) 70 - 99 mg/dL    Comment: Glucose reference range applies only to samples taken after fasting for at least 8 hours.   BUN 82 (H) 6 - 20 mg/dL   Creatinine, Ser 95.63 (H) 0.61 - 1.24 mg/dL   Calcium 9.4 8.9 - 87.5 mg/dL   Total Protein 6.7 6.5 - 8.1 g/dL   Albumin 3.1 (L) 3.5 -  5.0 g/dL   AST 14 (L) 15 - 41 U/L   ALT 9 0 - 44 U/L   Alkaline Phosphatase 67 38 - 126 U/L   Total Bilirubin 0.6 0.0 - 1.2 mg/dL   GFR, Estimated 4 (L) >60 mL/min    Comment: (NOTE) Calculated using the CKD-EPI Creatinine Equation (2021)    Anion gap 19 (H) 5 - 15    Comment: Performed at [facility], [address] [place], [place] [address]  Troponin I (High Sensitivity)     Status: Abnormal   Collection Time: 04/08/23  3:31 PM  Result Value Ref Range   Troponin I (High Sensitivity) 101 (HH) <18 ng/L    Comment: CRITICAL RESULT CALLED TO, READ BACK BY AND VERIFIED WITH J NEWTON RN 04/08/2023 1704 BNUNNERY (NOTE) Elevated high sensitivity troponin I (hsTnI) values and significant  changes across serial measurements may suggest ACS but many other  chronic and acute conditions are known to elevate hsTnI results.  Refer to the "Links" section for chest pain algorithms and additional  guidance. Performed at [facility], [address] [place], [place] [address]   Brain natriuretic peptide     Status: Abnormal   Collection Time: 04/08/23  3:31 PM  Result Value Ref Range   B Natriuretic Peptide 846.2 (H) 0.0 - 100.0 pg/mL    Comment: Performed at [facility], [address], [place], [place] [address]  Magnesium     Status: Abnormal   Collection Time: 04/08/23  3:31 PM  Result Value Ref Range   Magnesium 3.0 (H) 1.7 - 2.4 mg/dL    Comment: Performed at [facility], [address] [place], [place] [address]   DG Chest Portable 1 View Result Date: 04/08/2023 CLINICAL DATA:  SOB. EXAM: PORTABLE CHEST 1 VIEW COMPARISON:  02/15/2023. FINDINGS: Alveolar consolidation right hemithorax consistent with a pneumonia or pulmonary edema. Diffuse pulmonary vascular congestion. Prominent cardiac silhouette. No pneumothorax. Aorta is calcified. IMPRESSION: Findings suggest CHF with possible superimposed right-sided pneumonia or asymmetric edema that is worse on the right. Electronically Signed   By: Layla Maw M.D.   On: 04/08/2023 16:52    PMH:   Past Medical History:  Diagnosis Date   Acute kidney injury (HCC) 12/28/2014   Adjustment disorder with mixed anxiety and depressed mood 12/28/2014   Asthma    CHF (congestive heart failure) (HCC)    CPAP  (continuous positive airway pressure) dependence    Diabetes mellitus without complication (HCC)    DM (diabetes mellitus) type II controlled with renal manifestation (HCC) 12/28/2014   DM (diabetes mellitus) type II controlled, neurological manifestation (HCC) 12/28/2014   Encephalopathy, hypertensive 12/28/2014   GERD (gastroesophageal reflux disease)    Hypertension    Hypertensive emergency without congestive heart failure 12/27/2014   Pneumonia    Possible Panic disorder 12/28/2014   Renal disorder    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   Sleep apnea     PSH:   Past Surgical History:  Procedure Laterality Date   AV FISTULA PLACEMENT Left 01/05/2020   Procedure: LEFT ARM BRACHIOCEPHALIC ARTERIOVENOUS (AV) FISTULA;  Surgeon: Chuck Hint, MD;  Location: [facility] OR;  Service: Vascular;  Laterality: Left;   EMPYEMA DRAINAGE Right 05/17/2017   Procedure: EMPYEMA DRAINAGE AND DECORTICATION;  Surgeon: Delight Ovens, MD;  Location: [facility] OR;  Service: Thoracic;  Laterality: Right;   FLEXIBLE BRONCHOSCOPY  05/17/2017   Procedure: FLEXIBLE BRONCHOSCOPY;  Surgeon: Delight Ovens,  MD;  Location: [facility] OR;  Service: Thoracic;;   INSERTION OF DIALYSIS CATHETER Right 12/12/2021   Procedure: INSERTION OF RIGHT INTERNAL JUGULAR DIALYSIS CATHETER;  Surgeon: Chuck Hint, MD;  Location: [facility] OR;  Service: Vascular;  Laterality: Right;   IR FLUORO GUIDE CV LINE RIGHT  01/01/2020   IR THORACENTESIS ASP PLEURAL SPACE W/IMG GUIDE  05/17/2017   IR US GUIDE VASC ACCESS RIGHT  01/01/2020   NO PAST SURGERIES     REVISON OF ARTERIOVENOUS FISTULA Left 12/12/2021   Procedure: REVISION OF LEFT ARM FISTULA BY PLICATION;  Surgeon: Chuck Hint, MD;  Location: [facility] OR;  Service: Vascular;  Laterality: Left;   THORACOTOMY/LOBECTOMY Right 05/17/2017   Procedure: RIGHT MINI THORACOTOMY;  Surgeon: Delight Ovens, MD;  Location: [facility] OR;  Service: Thoracic;  Laterality: Right;   VIDEO ASSISTED THORACOSCOPY  (VATS)/EMPYEMA Right 05/17/2017   Procedure: RIGHT VIDEO ASSISTED THORACOSCOPY (VATS)/EMPYEMA;  Surgeon: Delight Ovens, MD;  Location: [facility] OR;  Service: Thoracic;  Laterality: Right;    Allergies:  Allergies  Allergen Reactions   Oxycodone Itching    Medications:   Prior to Admission medications   Medication Sig Start Date End Date Taking? Authorizing Provider  Accu-Chek Softclix Lancets lancets Use as directed.  For insulin-dependent type 2 diabetes Monitor  glucose 3 times a day Patient taking differently: 1 each by Other route See admin instructions. Use as directed.  For insulin-dependent type 2 diabetes Monitor  glucose 3 times a day 01/06/20   Albertine Grates, MD  albuterol (PROVENTIL) (2.5 MG/3ML) 0.083% nebulizer solution Take 3 mLs (2.5 mg total) by nebulization every 6 (six) hours as needed for wheezing or shortness of breath. 09/25/22   Alfonse Spruce, MD  albuterol (VENTOLIN HFA) 108 (90 Base) MCG/ACT inhaler Inhale 2 puffs into the lungs every 6 (six) hours as needed for wheezing or shortness of breath. 09/25/22   Alfonse Spruce, MD  amLODipine (NORVASC) 10 MG tablet Take 1 tablet (10 mg total) by mouth daily. 05/24/22   Rocky Morel, DO  Budeson-Glycopyrrol-Formoterol (BREZTRI AEROSPHERE) 160-9-4.8 MCG/ACT AERO Inhale 2 puffs into the lungs in the morning and at bedtime. 10/29/22   [provider]  busPIRone (BUSPAR) 5 MG tablet Take 5 mg by mouth 2 (two) times daily. 02/05/23   [provider]  calcitRIOL (ROCALTROL) 0.25 MCG capsule Take 1 capsule (0.25 mcg total) by mouth every Monday, Wednesday, and Friday. 01/08/20   Albertine Grates, MD  camphor-menthol [facility]) lotion Apply topically 2 (two) times daily. Patient taking differently: Apply 1 Application topically 2 (two) times daily. 12/03/22   Lockie Mola, MD  carvedilol (COREG) 25 MG tablet Take 1 tablet (25 mg total) by mouth 2 (two) times daily with a meal. 05/24/22   Rocky Morel, DO   gabapentin (NEURONTIN) 300 MG capsule Take 1 capsule (300 mg total) by mouth daily. 01/25/23   Peterson Ao, MD  glucose blood test strip Use as instructed For insulin-dependent type 2 diabetes Monitor  glucose 3 times a day Patient taking differently: 1 each by Other route See admin instructions. Use as instructed For insulin-dependent type 2 diabetes Monitor  glucose 3 times a day 01/06/20   Albertine Grates, MD  hydrALAZINE (APRESOLINE) 50 MG tablet Take 1 tablet (50 mg total) by mouth 3 (three) times daily. 02/19/23 03/21/23  Morene Crocker, MD  HYDROmorphone (DILAUDID) 2 MG tablet Take 0.5 tablets (1 mg total) by mouth every 4 (four) hours as needed for severe pain (pain score  7-10). 01/24/23   Peterson Ao, MD  hydrOXYzine (ATARAX) 25 MG tablet Take 25 mg by mouth daily. 02/08/23   [provider]  levocetirizine (XYZAL) 5 MG tablet Take 1 tablet (5 mg total) by mouth every evening. 09/25/22   Alfonse Spruce, MD  lidocaine (XYLOCAINE) 2 % jelly Apply 1 Application topically daily. 11/26/22   Alvira Monday, MD  multivitamin (RENA-VIT) TABS tablet Take 1 tablet by mouth at bedtime. 05/24/22   Rocky Morel, DO  polyethylene glycol powder (GLYCOLAX/MIRALAX) 17 GM/SCOOP powder Mix as directed and take 1 capful (17 g) by mouth daily. 02/20/23   Morene Crocker, MD  sevelamer carbonate (RENVELA) 800 MG tablet Take 3 tablets (2,400 mg total) by mouth 3 (three) times daily with meals. 12/03/22   Lockie Mola, MD  traMADol (ULTRAM) 50 MG tablet Take 1 tablet (50 mg total) by mouth every 12 (twelve) hours as needed for moderate pain (pain score 4-6). 01/24/23   Peterson Ao, MD  Vitamin D, Ergocalciferol, (DRISDOL) 1.25 MG (50000 UNIT) CAPS capsule Take 50,000 Units by mouth See admin instructions. Take one tablet by mouth on Monday, Wednesdays and Fridays per patient 08/17/19   [provider]  XPHOZAH 30 MG TABS Take 1 tablet by mouth 2 (two) times daily.  01/15/23   [provider]  insulin aspart protamine - aspart (NOVOLOG MIX 70/30 FLEXPEN) (70-30) 100 UNIT/ML FlexPen Inject 15-30 Units into the skin 2 (two) times daily.  05/24/22  [provider]    Discontinued Meds:  There are no discontinued medications.  Social History:  reports that he has never smoked. He has never been exposed to tobacco smoke. He has never used smokeless tobacco. He reports that he does not drink alcohol and does not use drugs.  Family History:   Family History  Problem Relation Age of Onset   Hypertension Mother    Lung cancer Father     Blood pressure (!) 203/105, pulse 90, temperature 97.7 F (36.5 C), temperature source Oral, resp. rate 20, SpO2 100%. General: WDWN male in NAD Head: NCAT sclera not icteric MMM Neck: Supple.  Lungs: +crackles b/l, nml WOB on neb w/3L O2 Heart: RRR. No murmur, rubs or gallops.  Abdomen: soft, nontender, +BS, no guarding, no rebound tenderness M/S:  Equal strength b/l in upper and lower extremities.  Lower extremities:no edema, ischemic changes, or open wounds  Neuro: AAOx3. Moves all extremities spontaneously. Psych:  Responds to questions appropriately with a normal affect. Dialysis Access: LUA AVF aneurysmal but good bruit         Nataleigh Griffin, Len Blalock, MD 04/08/2023, 5:46 PM

## 2023-04-08 NOTE — ED Provider Notes (Signed)
[place] [facility]ERG[facility] DEPARTMENT AT [facility] Provider Note  HPI   [name] is a 57 y.o. male patient with a PMHx of diabetes, ESRD on Monday Wednesday Friday, hypertension, pain, neuropathy, who is here today with concern for shortness of breath.  Patient states that he is traditionally on a Monday Wednesday Friday dialysis schedule, and his last dialysis session was Thursday, April 04, 2023 given the holiday schedule.  You supposed to get dialysis tomorrow on 04/09/2023, however today, he started developing some shortness of breath, mild generalized chest pain, and does not feel well overall, some scattered fevers and chills.  No sick contacts.    ROS Negative except as per HPI   Medical Decision Making   Upon presentation, the patient is hypertensive to 203/118, afebrile, tachypneic.  When I walked in the room, the patient was on 2 L saturating around 97%, I turned this off, new sats around 93%, however just from sitting upright in bed on his own strength, he desatted to about 8584% with minimal effort.  I imagine if we ambulate this patient he will significantly desaturate.  I reviewed his EKG which was done prior to me seeing the patient, shows normal sinus rhythm with no peaked T waves and no signs of ischemia or arrhythmia  For this patient, generalized concern is fluid overload given his hypertension, tachypnea, new oxygen requirement, and not having dialysis in over 4 to 5 days.  We will testing for COVID and flu, obtain basic blood work CBC CMP evaluate his renal function and electrolytes, obtain chest x-ray to look for fluid status, as well as a BNP, given his mild generalized chest pain I will get a troponin as well, do not think this is a pulmonary embolism as we have much better explanation of his shortness of breath given his dialysis history and physical exam, he is not coughing up any blood, no unilateral leg swelling, and no history of cancer and no history  of DVT or PE as well.   Clinical Course as of 04/08/23 1828  Mon Apr 08, 2023  1623 Bipap and nitro [JL]    Clinical Course User Index [JL] Gunnar Bulla, MD    Patient's workup consistent with fluid overload, BNP elevated 846, troponin 101 I feel that this is demand ischemia secondary to his process.  We did give the patient BiPAP, and started on nitroglycerin drip, magnesium elevated 3.0, has scattered metabolic derangement, bicarbonate 17 creatinine 14.69, does have a mild leukocytosis 10.9, x-ray is reading possible pneumonia otherwise not having any productive sputum and I do not think this is a clinical pneumonia I think this is all fluid.  His COVID and flu is negative as well.  Will follow-up in the second troponin, spoke to nephrology, they are going to do their best to try to dialyze the patient tonight, he remains on BiPAP, stable, blood pressure still 200 despite 80 mcg of nitroglycerin drip.  Going to give a dose of 20 mg of labetalol, as well as oral hydralazine as well and attempt to wean him off the nitroglycerin drip, so he can safely go to dialysis.  Medicine attending will assume care of this patient   1. Acute respiratory failure with hypoxia (HCC)   2. Hypervolemia, unspecified hypervolemia type     @DISPOSITION @  Rx / DC Orders ED Discharge Orders     None        Past Medical History:  Diagnosis Date   Acute kidney injury (HCC)  12/28/2014   Adjustment disorder with mixed anxiety and depressed mood 12/28/2014   Asthma    CHF (congestive heart failure) (HCC)    CPAP (continuous positive airway pressure) dependence    Diabetes mellitus without complication (HCC)    DM (diabetes mellitus) type II controlled with renal manifestation (HCC) 12/28/2014   DM (diabetes mellitus) type II controlled, neurological manifestation (HCC) 12/28/2014   Encephalopathy, hypertensive 12/28/2014   GERD (gastroesophageal reflux disease)    Hypertension    Hypertensive  emergency without congestive heart failure 12/27/2014   Pneumonia    Possible Panic disorder 12/28/2014   Renal disorder    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   Sleep apnea    Past Surgical History:  Procedure Laterality Date   AV FISTULA PLACEMENT Left 01/05/2020   Procedure: LEFT ARM BRACHIOCEPHALIC ARTERIOVENOUS (AV) FISTULA;  Surgeon: Chuck Hint, MD;  Location: [facility] OR;  Service: Vascular;  Laterality: Left;   EMPYEMA DRAINAGE Right 05/17/2017   Procedure: EMPYEMA DRAINAGE AND DECORTICATION;  Surgeon: Delight Ovens, MD;  Location: [facility] OR;  Service: Thoracic;  Laterality: Right;   FLEXIBLE BRONCHOSCOPY  05/17/2017   Procedure: FLEXIBLE BRONCHOSCOPY;  Surgeon: Delight Ovens, MD;  Location: [facility] OR;  Service: Thoracic;;   INSERTION OF DIALYSIS CATHETER Right 12/12/2021   Procedure: INSERTION OF RIGHT INTERNAL JUGULAR DIALYSIS CATHETER;  Surgeon: Chuck Hint, MD;  Location: [facility] OR;  Service: Vascular;  Laterality: Right;   IR FLUORO GUIDE CV LINE RIGHT  01/01/2020   IR THORACENTESIS ASP PLEURAL SPACE W/IMG GUIDE  05/17/2017   IR US GUIDE VASC ACCESS RIGHT  01/01/2020   NO PAST SURGERIES     REVISON OF ARTERIOVENOUS FISTULA Left 12/12/2021   Procedure: REVISION OF LEFT ARM FISTULA BY PLICATION;  Surgeon: Chuck Hint, MD;  Location: [facility] OR;  Service: Vascular;  Laterality: Left;   THORACOTOMY/LOBECTOMY Right 05/17/2017   Procedure: RIGHT MINI THORACOTOMY;  Surgeon: Delight Ovens, MD;  Location: [facility] OR;  Service: Thoracic;  Laterality: Right;   VIDEO ASSISTED THORACOSCOPY (VATS)/EMPYEMA Right 05/17/2017   Procedure: RIGHT VIDEO ASSISTED THORACOSCOPY (VATS)/EMPYEMA;  Surgeon: Delight Ovens, MD;  Location: [facility] OR;  Service: Thoracic;  Laterality: Right;   Family History  Problem Relation Age of Onset   Hypertension Mother    Lung cancer Father    Social History   Socioeconomic History   Marital status: Married    Spouse name: Not on file   Number of  children: Not on file   Years of education: Not on file   Highest education level: Not on file  Occupational History   Not on file  Tobacco Use   Smoking status: Never    Passive exposure: Never   Smokeless tobacco: Never  Vaping Use   Vaping status: Never Used  Substance and Sexual Activity   Alcohol use: No   Drug use: No   Sexual activity: Not on file  Other Topics Concern   Not on file  Social History Narrative   Not on file   Social Drivers of Health   Financial Resource Strain: Not on file  Food Insecurity: Food Insecurity Present (01/22/2023)   Hunger Vital Sign    Worried About Running Out of Food in the Last Year: Sometimes true    Ran Out of Food in the Last Year: Never true  Transportation Needs: No Transportation Needs (01/22/2023)   PRAPARE - Transportation    Lack of Transportation (Medical): No    Lack of Transportation (Non-Medical):  No  Physical Activity: Not on file  Stress: Not on file  Social Connections: Unknown (08/22/2021)   Received from [facility], [facility]   Social Network    Social Network: Not on file  Intimate Partner Violence: Not At Risk (01/22/2023)   Humiliation, Afraid, Rape, and Kick questionnaire    Fear of Current or Ex-Partner: No    Emotionally Abused: No    Physically Abused: No    Sexually Abused: No     Physical Exam   Vitals:   04/08/23 1730 04/08/23 1740 04/08/23 1745 04/08/23 1800  BP: (!) 187/97 (!) 203/105 (!) 188/106 (!) 202/105  Pulse: 88 90 87 88  Resp: 19 20 18  (!) 21  Temp:      TempSrc:      SpO2: 100% 100% 100% 100%    Physical Exam Vitals and nursing note reviewed.  Constitutional:      General: He is not in acute distress.    Appearance: Normal appearance. He is well-developed. He is not ill-appearing or toxic-appearing.  HENT:     Head: Normocephalic and atraumatic.     Right Ear: External ear normal.     Left Ear: External ear normal.     Nose: Nose normal.     Mouth/Throat:     Mouth:  Mucous membranes are moist.  Eyes:     Extraocular Movements: Extraocular movements intact.     Pupils: Pupils are equal, round, and reactive to light.  Cardiovascular:     Rate and Rhythm: Normal rate.     Pulses: Normal pulses.  Pulmonary:     Effort: Pulmonary effort is normal. No respiratory distress.     Breath sounds: No stridor. No wheezing or rhonchi.     Comments: Patient has faint crackles Abdominal:     General: There is distension (Mild distention which patient states is baseline, nontender).     Palpations: Abdomen is soft.     Tenderness: There is no abdominal tenderness. There is no right CVA tenderness or left CVA tenderness.  Musculoskeletal:        General: Normal range of motion.     Cervical back: Normal range of motion and neck supple.  Skin:    General: Skin is warm and dry.     Capillary Refill: Capillary refill takes less than 2 seconds.     Comments: Nonpitting lower extremity edema  Neurological:     General: No focal deficit present.     Mental Status: He is alert and oriented to person, place, and time. Mental status is at baseline.  Psychiatric:        Mood and Affect: Mood normal.      Procedures   If procedures were preformed on this patient, they are listed below:  Procedures  The patient was seen, evaluated, and treated in conjunction with the attending physician, who voiced agreement in the care provided.  Note generated using Dragon voice dictation software and may contain dictation errors. Please contact me for any clarification or with any questions.   Electronically signed by:  Osvaldo Shipper, M.D. (PGY-2)    Gunnar Bulla, MD 04/08/23 Lysle Dingwall, DO 04/08/23 1610

## 2023-04-08 NOTE — ED Notes (Addendum)
Patient transported to dialysis on monitor with transport and respiratory.

## 2023-04-08 NOTE — Hospital Course (Addendum)
57 y.o. M with ESRD on HD MWF, HFpEF,  pAF not on AC, HTN, and DM who presented with SOB for 1 day after missing a dialysis session because of miscommunication about the holiday schedule.

## 2023-04-08 NOTE — Progress Notes (Signed)
Patient placed on bipap per MD order.  Patient is tolerating well at this time.  Will continue to monitor.

## 2023-04-09 DIAGNOSIS — N186 End stage renal disease: Secondary | ICD-10-CM | POA: Diagnosis present

## 2023-04-09 DIAGNOSIS — D631 Anemia in chronic kidney disease: Secondary | ICD-10-CM | POA: Insufficient documentation

## 2023-04-09 DIAGNOSIS — Z8249 Family history of ischemic heart disease and other diseases of the circulatory system: Secondary | ICD-10-CM | POA: Diagnosis not present

## 2023-04-09 DIAGNOSIS — J45909 Unspecified asthma, uncomplicated: Secondary | ICD-10-CM | POA: Diagnosis present

## 2023-04-09 DIAGNOSIS — Z79899 Other long term (current) drug therapy: Secondary | ICD-10-CM | POA: Diagnosis not present

## 2023-04-09 DIAGNOSIS — Z992 Dependence on renal dialysis: Secondary | ICD-10-CM | POA: Diagnosis not present

## 2023-04-09 DIAGNOSIS — K219 Gastro-esophageal reflux disease without esophagitis: Secondary | ICD-10-CM | POA: Diagnosis present

## 2023-04-09 DIAGNOSIS — I160 Hypertensive urgency: Secondary | ICD-10-CM | POA: Diagnosis present

## 2023-04-09 DIAGNOSIS — Z6837 Body mass index (BMI) 37.0-37.9, adult: Secondary | ICD-10-CM | POA: Diagnosis not present

## 2023-04-09 DIAGNOSIS — N2581 Secondary hyperparathyroidism of renal origin: Secondary | ICD-10-CM | POA: Diagnosis present

## 2023-04-09 DIAGNOSIS — J9601 Acute respiratory failure with hypoxia: Secondary | ICD-10-CM | POA: Diagnosis present

## 2023-04-09 DIAGNOSIS — Z1152 Encounter for screening for COVID-19: Secondary | ICD-10-CM | POA: Diagnosis not present

## 2023-04-09 DIAGNOSIS — I5032 Chronic diastolic (congestive) heart failure: Secondary | ICD-10-CM | POA: Diagnosis present

## 2023-04-09 DIAGNOSIS — Z7951 Long term (current) use of inhaled steroids: Secondary | ICD-10-CM | POA: Diagnosis not present

## 2023-04-09 DIAGNOSIS — Z885 Allergy status to narcotic agent status: Secondary | ICD-10-CM | POA: Diagnosis not present

## 2023-04-09 DIAGNOSIS — I480 Paroxysmal atrial fibrillation: Secondary | ICD-10-CM | POA: Diagnosis present

## 2023-04-09 DIAGNOSIS — E1122 Type 2 diabetes mellitus with diabetic chronic kidney disease: Secondary | ICD-10-CM | POA: Diagnosis present

## 2023-04-09 DIAGNOSIS — E8770 Fluid overload, unspecified: Secondary | ICD-10-CM | POA: Diagnosis present

## 2023-04-09 DIAGNOSIS — I132 Hypertensive heart and chronic kidney disease with heart failure and with stage 5 chronic kidney disease, or end stage renal disease: Secondary | ICD-10-CM | POA: Diagnosis present

## 2023-04-09 DIAGNOSIS — E872 Acidosis, unspecified: Secondary | ICD-10-CM | POA: Diagnosis present

## 2023-04-09 DIAGNOSIS — E66812 Obesity, class 2: Secondary | ICD-10-CM | POA: Diagnosis present

## 2023-04-09 DIAGNOSIS — G4733 Obstructive sleep apnea (adult) (pediatric): Secondary | ICD-10-CM | POA: Diagnosis present

## 2023-04-09 DIAGNOSIS — E1140 Type 2 diabetes mellitus with diabetic neuropathy, unspecified: Secondary | ICD-10-CM | POA: Diagnosis present

## 2023-04-09 LAB — CBC
HCT: 31.1 % — ABNORMAL LOW (ref 39.0–52.0)
Hemoglobin: 9.9 g/dL — ABNORMAL LOW (ref 13.0–17.0)
MCH: 30.6 pg (ref 26.0–34.0)
MCHC: 31.8 g/dL (ref 30.0–36.0)
MCV: 96 fL (ref 80.0–100.0)
Platelets: 269 10*3/uL (ref 150–400)
RBC: 3.24 MIL/uL — ABNORMAL LOW (ref 4.22–5.81)
RDW: 14.5 % (ref 11.5–15.5)
WBC: 9 10*3/uL (ref 4.0–10.5)
nRBC: 0 % (ref 0.0–0.2)

## 2023-04-09 LAB — RENAL FUNCTION PANEL
Albumin: 2.7 g/dL — ABNORMAL LOW (ref 3.5–5.0)
Anion gap: 13 (ref 5–15)
BUN: 44 mg/dL — ABNORMAL HIGH (ref 6–20)
CO2: 25 mmol/L (ref 22–32)
Calcium: 9.1 mg/dL (ref 8.9–10.3)
Chloride: 97 mmol/L — ABNORMAL LOW (ref 98–111)
Creatinine, Ser: 8.57 mg/dL — ABNORMAL HIGH (ref 0.61–1.24)
GFR, Estimated: 7 mL/min — ABNORMAL LOW (ref >60)
Glucose, Bld: 144 mg/dL — ABNORMAL HIGH (ref 70–99)
Phosphorus: 6.4 mg/dL — ABNORMAL HIGH (ref 2.5–4.6)
Potassium: 4.4 mmol/L (ref 3.5–5.1)
Sodium: 135 mmol/L (ref 135–145)

## 2023-04-09 LAB — GLUCOSE, CAPILLARY
Glucose-Capillary: 129 mg/dL — ABNORMAL HIGH (ref 70–99)
Glucose-Capillary: 119 mg/dL — ABNORMAL HIGH (ref 70–99)
Glucose-Capillary: 170 mg/dL — ABNORMAL HIGH (ref 70–99)

## 2023-04-09 LAB — CBG MONITORING, ED: Glucose-Capillary: 111 mg/dL — ABNORMAL HIGH (ref 70–99)

## 2023-04-09 LAB — MAGNESIUM: Magnesium: 2.5 mg/dL — ABNORMAL HIGH (ref 1.7–2.4)

## 2023-04-09 MED ORDER — DARBEPOETIN ALFA 40 MCG/0.4ML IJ SOSY
40.0000 ug | PREFILLED_SYRINGE | INTRAMUSCULAR | Status: DC
  Administered 2023-04-10: 40 ug via SUBCUTANEOUS
  Filled 2023-04-09 (×2): qty 0.4

## 2023-04-09 MED ORDER — TRAMADOL HCL 50 MG PO TABS
50.0000 mg | ORAL_TABLET | Freq: Two times a day (BID) | ORAL | Status: DC | PRN
  Administered 2023-04-09 – 2023-04-11 (×2): 50 mg via ORAL
  Filled 2023-04-09 (×2): qty 1

## 2023-04-09 MED ORDER — DM-GUAIFENESIN ER 30-600 MG PO TB12
1.0000 | ORAL_TABLET | Freq: Two times a day (BID) | ORAL | Status: DC
  Administered 2023-04-09 – 2023-04-11 (×3): 1 via ORAL
  Filled 2023-04-09 (×3): qty 1

## 2023-04-09 MED ORDER — CHLORHEXIDINE GLUCONATE CLOTH 2 % EX PADS
6.0000 | MEDICATED_PAD | Freq: Every day | CUTANEOUS | Status: DC
  Administered 2023-04-09 – 2023-04-10 (×2): 6 via TOPICAL

## 2023-04-09 NOTE — Assessment & Plan Note (Addendum)
Glucose controlled.  Does not appear to be on insulin at home. - Continue sliding scale corrections - Continue home gabapentin

## 2023-04-09 NOTE — Progress Notes (Signed)
   04/09/23 0015  Vitals  BP (!) 197/99  BP Location Right Arm  BP Method Automatic  Patient Position (if appropriate) Lying  Pulse Rate Source Monitor  Resp 20  Oxygen Therapy  SpO2 100 %  O2 Device Nasal Cannula  O2 Flow Rate (L/min) 2 L/min  During Treatment Monitoring  Blood Flow Rate (mL/min) 0 mL/min  Arterial Pressure (mmHg) -2.63 mmHg  Venous Pressure (mmHg) -2.02 mmHg  TMP (mmHg) -49.89 mmHg  Ultrafiltration Rate (mL/min) 976 mL/min  Dialysate Flow Rate (mL/min) 0 ml/min  Dialysate Potassium Concentration 3  Dialysate Calcium  Concentration 2.5  Duration of HD Treatment -hour(s) 3.75 hour(s)  Cumulative Fluid Removed (mL) per Treatment  3300.16  HD Safety Checks Performed Yes  Intra-Hemodialysis Comments Tx completed  Post Treatment  Dialyzer Clearance Lightly streaked  Liters Processed 85.4  Fluid Removed (mL) 3300 mL  Tolerated HD Treatment Yes  Post-Hemodialysis Comments pt stable  AVG/AVF Arterial Site Held (minutes) 10 minutes  AVG/AVF Venous Site Held (minutes) 10 minutes   Pt tolerate treatment without complications. Net UF . Pt return to ED stable.

## 2023-04-09 NOTE — Assessment & Plan Note (Signed)
Rate controlled - Continue carvedilol

## 2023-04-09 NOTE — Assessment & Plan Note (Addendum)
-   Consult nephrology for dialysis - Continue Aranesp, Renvela - Resume Rocaltrol

## 2023-04-09 NOTE — Assessment & Plan Note (Signed)
-   Volume management with dialysis

## 2023-04-09 NOTE — Consult Note (Signed)
  [facility] Progress Note   Subjective:   Patient underwent HD early this AM with net UF 3.3L. He was able to be weaned off of BiPAP. Reports he is feeling a bit better but was still short of breath when ambulating to the bathroom and is requiring 2L O2. His outpatient dialysis unit is closed tomorrow due to the holiday. Denies CP, HA, blurry vision.   Objective Vitals:   04/09/23 0800 04/09/23 0900 04/09/23 0915 04/09/23 0916  BP: (!) 142/111 (!) 140/119    Pulse: 77 73 73 73  Resp:  (!) 25 20 16   Temp:      TempSrc:      SpO2:  93% 99% 99%   Physical Exam General: Alert male in NAD Heart:  RRR, no murmurs, rubs or gallops Lungs: CTA bilaterally, respirations unlabored on 2L O2 Abdomen: Soft, non-distended, +BS Extremities: No edema b/l lower extremities Dialysis Access: LUE AVF + t/b  Additional Objective Labs: Basic Metabolic Panel: Recent Labs  Lab 04/08/23 1531 04/09/23 0613  NA 137 135  K 4.3 4.4  CL 101 97*  CO2 17* 25  GLUCOSE 128* 144*  BUN 82* 44*  CREATININE 14.69* 8.57*  CALCIUM  9.4 9.1  PHOS  --  6.4*   Liver Function Tests: Recent Labs  Lab 04/08/23 1531 04/09/23 0613  AST 14*  --   ALT 9  --   ALKPHOS 67  --   BILITOT 0.6  --   PROT 6.7  --   ALBUMIN 3.1* 2.7*   No results for input(s): LIPASE, AMYLASE in the last 168 hours. CBC: Recent Labs  Lab 04/08/23 1531 04/09/23 0613  WBC 10.9* 9.0  NEUTROABS 8.9*  --   HGB 10.6* 9.9*  HCT 34.1* 31.1*  MCV 100.9* 96.0  PLT 292 269   Blood Culture    Component Value Date/Time   SDES BLOOD RIGHT ANTECUBITAL 11/30/2022 1726   SPECREQUEST  11/30/2022 1726    BOTTLES DRAWN AEROBIC AND ANAEROBIC Blood Culture adequate volume   CULT  11/30/2022 1726    NO GROWTH 5 DAYS Performed at [facility] [place] [facility], [address], [place], [place] [address]    REPTSTATUS 12/05/2022 FINAL 11/30/2022 1726    Cardiac Enzymes: No results for input(s): CKTOTAL, CKMB, CKMBINDEX,  TROPONINI in the last 168 hours. CBG: Recent Labs  Lab 04/09/23 0739  GLUCAP 111*   Iron Studies: No results for input(s): IRON, TIBC, TRANSFERRIN, FERRITIN in the last 72 hours. @lablastinr3 @ Studies/Results: DG Chest Portable 1 View Result Date: 04/08/2023 CLINICAL DATA:  SOB. EXAM: PORTABLE CHEST 1 VIEW COMPARISON:  02/15/2023. FINDINGS: Alveolar consolidation right hemithorax consistent with a pneumonia or pulmonary edema. Diffuse pulmonary vascular congestion. Prominent cardiac silhouette. No pneumothorax. Aorta is calcified. IMPRESSION: Findings suggest CHF with possible superimposed right-sided pneumonia or asymmetric edema that is worse on the right. Electronically Signed   By: Fonda Field M.D.   On: 04/08/2023 16:52   Medications:   amLODipine   10 mg Oral Daily   carvedilol   25 mg Oral BID WC   Chlorhexidine  Gluconate Cloth  6 each Topical Q0600   gabapentin   300 mg Oral Daily   heparin   5,000 Units Subcutaneous Q8H   hydrALAZINE   50 mg Oral TID   insulin  aspart  0-6 Units Subcutaneous TID WC   sevelamer  carbonate  2,400 mg Oral TID WC    Outpatient Dialysis Orders: MWF  - SW  4.25 hrs, BFR 400, DFR AF 1.5,  EDW 95.5 kg,  2K/ 2Ca   Access: AVF  Heparin  5000 unit initial, 2000 unit intermittent Mircera 75 mcg q2wks - last 01/03/23 Hectorol  5mcg IV qHD   Sensipar  180mg  QHD  Assessment/Plan: Acute hypoxic respiratory failure - pulmonary edema on CXR, 2/2 missed HD + large fluid gains. He has been off schedule with his dialysis lately and missed last session on Sunday (reports he was confused about the holiday schedule). Underwent HD early this AM with improvement in symptoms, but still requiring O2 via [place].  Chest Pain-likely secondary to demand ischemia. Per PMD  ESRD -  on MWF.  His outpatient HD is closed tomorrow due to the holiday. Since respiratory status is still not to baseline and he already had an extra HD today, will plan for HD here tomorrow,  hopefully able to d/c after.   Hypertension  - Severe HTN in ED, required nitro drip. Improved s/p HD, continue home antihypertensives.   Anemia of CKD - Hgb 9.9, will order aranesp  with next HD    Secondary Hyperparathyroidism -  Calcium  in goal. Phos slightly elevated. Continue home meds.   Nutrition - Renal diet w/fluid restrictions.   Lucie Collet, PA-C 04/09/2023, 10:03 AM   [facility] Pager: [phone]

## 2023-04-09 NOTE — Progress Notes (Signed)
 RT NOTE: patient resting comfortably on 2L [place] with no respiratory distress noted.  Bipap currently not indicated at this time. Will adjust order to PRN.  Will continue to monitor.

## 2023-04-09 NOTE — Progress Notes (Signed)
   04/09/23 1202  TOC Brief Assessment  Insurance and Status Reviewed [facility] Medicare)  Patient has primary care physician Yes Valda Like , MD)  Home environment has been reviewed From home  Prior level of function: Independent  Prior/Current Home Services No current home services  Social Drivers of Health Review SDOH reviewed no interventions necessary  Readmission risk has been reviewed Yes (N/A listed)  Transition of care needs no transition of care needs at this time    Patient presented with ESRD on HD MWF, chronic diastolic heart failure, paroxysmal atrial fibrillation not on anticoagulation, type 2 diabetes, hypertension, class II obesity, anemia of ESRD presenting with shortness of breath. No TOC needs at this time. Please place [facility] consult should needs arise

## 2023-04-09 NOTE — Assessment & Plan Note (Signed)
The patient presented with dyspnea, tachypnea, chest x-ray with no infiltrates, and requiring BiPAP for respiratory support.  Improved after dialysis, but still with subjective dyspnea, requiring oxygen.

## 2023-04-09 NOTE — Progress Notes (Signed)
  Progress Note   Patient: [name] FMW:[id number] DOB: 05-12-1965 DOA: 04/08/2023     0 DOS: the patient was seen and examined on 04/09/2023 at 4:30 PM      Brief hospital course: 57 y.o. M with ESRD on HD MWF, HFpEF,  pAF not on AC, HTN, and DM who presented with SOB for 1 day after missing a dialysis session because of miscommunication about the holiday schedule.       Assessment and Plan: * Acute hypoxic respiratory failure (HCC) The patient presented with dyspnea, tachypnea, chest x-ray with no infiltrates, and requiring BiPAP for respiratory support.  Improved after dialysis, but still with subjective dyspnea, requiring oxygen.  ESRD (end stage renal disease) (HCC) - Consult nephrology for dialysis - Continue Aranesp , Renvela  - Resume Rocaltrol   AF (paroxysmal atrial fibrillation) (HCC) Rate controlled - Continue carvedilol   DMII (diabetes mellitus, type 2) (HCC) Glucose controlled.  Does not appear to be on insulin  at home. - Continue sliding scale corrections - Continue home gabapentin   Anemia in chronic kidney disease (CKD) Hemoglobin stable relative to baseline  Chronic heart failure with preserved ejection fraction (HCC) - Volume management with dialysis  Myocardial injury Elevated troponin in the setting of renal failure and hypertensive urgency and respiratory failure.  No chest pain or ECG changes or other ischemic signs.  Severe uncontrolled hypertension Blood pressure improved with dialysis - Continue Coreg , Amlodipine , hydralazine           Subjective: Patient still feels somewhat dyspneic, he reports that at home when he is at his home dialysis center he always feels sick for the last 45 minutes of the session.  In the hospital he does shorter sessions and this does not happen.  Still requiring oxygen.     Physical Exam: BP 126/87 (BP Location: Right Arm)   Pulse 74   Temp 98 F (36.7 C) (Oral)   Resp 17   SpO2 97%   Adult male,  lying in bed, interactive and appropriate RRR, no murmurs, no peripheral edema Crackles at bilateral bases, no wheezing, respiratory effort seems normal, speaks in full sentences Abdomen soft no tenderness palpation or guarding No tension problems, face normal, moves upper extremity strength 5/5 and symmetric, speech fluent     Data Reviewed: Discussed with nephrology Basic metabolic panel shows elevated creatinine, normal sodium and potassium, magnesium somewhat elevated CBC shows stable normocytic anemia Troponin low and flat  Family Communication: None present   Disposition: Status is: Inpatient She was admitted for respiratory failure from fluid overload from missed dialysis  He was dialyzed once per day still dyspneic and hypoxic  Nephrology plan to dialyze him again tomorrow, then home tomorrow night after dialysis       Author: Lonni SHAUNNA Dalton, MD 04/09/2023 6:25 PM  For on call review [email or web address].

## 2023-04-09 NOTE — Care Management Obs Status (Signed)
MEDICARE OBSERVATION STATUS NOTIFICATION   Patient Details  Name: [place] Infinger MRN: [id number] Date of Birth: March 02, 1966   Medicare Observation Status Notification Given:  Yes    Gordy Clement, RN 04/09/2023, 11:49 AM

## 2023-04-09 NOTE — Assessment & Plan Note (Signed)
Elevated troponin in the setting of renal failure and hypertensive urgency and respiratory failure.  No chest pain or ECG changes or other ischemic signs.

## 2023-04-09 NOTE — ED Notes (Signed)
 ED TO INPATIENT HANDOFF REPORT  ED Nurse Name and Phone #: [phone] [name] Name/Age/Gender [name] 56 y.o. male Room/Bed: 032C/032C  Code Status   Code Status: Full Code  Home/SNF/Other Home Patient oriented to: self, place, time, and situation Is this baseline? Yes   Triage Complete: Triage complete  Chief Complaint Acute hypoxic respiratory failure (HCC) [J96.01]  Triage Note Pt BIB [facility] for SOB w/CP.  PT is Dialysis MWF pt and had last tx on Thurs 12/26 d/t Dialysis being closed on Christmas. Pt was  89 % RA 95% 2L.   PT is A&O x4.  220/130 89% RA, 95 2L,   EMS gave 2 SL nitro   Allergies Allergies  Allergen Reactions   Oxycodone  Itching    Level of Care/Admitting Diagnosis ED Disposition     ED Disposition  Admit   Condition  --   Comment  Hospital Area: [facility] [[id number]]  Level of Care: Progressive [102]  Admit to Progressive based on following criteria: RESPIRATORY PROBLEMS hypoxemic/hypercapnic respiratory failure that is responsive to NIPPV (BiPAP) or High Flow Nasal Cannula (6-80 lpm). Frequent assessment/intervention, no > Q2 hrs < Q4 hrs, to maintain oxygenation and pulmonary hygiene.  Admit to Progressive based on following criteria: NEPHROLOGY stable condition requiring close monitoring for AKI, requiring Hemodialysis or Peritoneal Dialysis either from expected electrolyte imbalance, acidosis, or fluid overload that can be managed by NIPPV or high flow oxygen.  May place patient in observation at [facility] or [facility] if equivalent level of care is available:: No  Covid Evaluation: Asymptomatic - no recent exposure (last 10 days) testing not required  Diagnosis: Acute hypoxic respiratory failure [facility]) [[id number]]  Admitting Physician: EMMY JUSTUS DEL [[id number]]  Attending Physician: EMMY JUSTUS DEL [[id number]]          B Medical/Surgery History Past Medical History:  Diagnosis Date   Acute kidney injury  (HCC) 12/28/2014   Adjustment disorder with mixed anxiety and depressed mood 12/28/2014   Asthma    CHF (congestive heart failure) (HCC)    CPAP (continuous positive airway pressure) dependence    Diabetes mellitus without complication (HCC)    DM (diabetes mellitus) type II controlled with renal manifestation (HCC) 12/28/2014   DM (diabetes mellitus) type II controlled, neurological manifestation (HCC) 12/28/2014   Encephalopathy, hypertensive 12/28/2014   GERD (gastroesophageal reflux disease)    Hypertension    Hypertensive emergency without congestive heart failure 12/27/2014   Pneumonia    Possible Panic disorder 12/28/2014   Renal disorder    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 OR;  Service: Thoracic;  Laterality: Right;   FLEXIBLE BRONCHOSCOPY  05/17/2017   Procedure: FLEXIBLE BRONCHOSCOPY;  Surgeon: Army Dallas NOVAK, MD;  Location: [facility] OR;  Service: Thoracic;;   INSERTION OF DIALYSIS CATHETER Right 12/12/2021   Procedure: INSERTION OF RIGHT INTERNAL JUGULAR DIALYSIS CATHETER;  Surgeon: Eliza Lonni RAMAN, MD;  Location: [facility] OR;  Service: Vascular;  Laterality: Right;   IR FLUORO GUIDE CV LINE RIGHT  01/01/2020   IR THORACENTESIS ASP PLEURAL SPACE W/IMG GUIDE  05/17/2017   IR  US  GUIDE VASC ACCESS RIGHT  01/01/2020   NO PAST SURGERIES     REVISON OF ARTERIOVENOUS FISTULA Left 12/12/2021   Procedure: REVISION OF LEFT ARM FISTULA BY PLICATION;  Surgeon: Eliza Lonni RAMAN, MD;  Location: [facility] OR;  Service: Vascular;  Laterality: Left;   THORACOTOMY/LOBECTOMY Right 05/17/2017   Procedure: RIGHT  MINI THORACOTOMY;  Surgeon: Army Dallas NOVAK, MD;  Location: [facility] OR;  Service: Thoracic;  Laterality: Right;   VIDEO ASSISTED THORACOSCOPY (VATS)/EMPYEMA Right 05/17/2017   Procedure: RIGHT VIDEO ASSISTED THORACOSCOPY (VATS)/EMPYEMA;  Surgeon: Army Dallas NOVAK, MD;  Location: [facility] OR;  Service: Thoracic;  Laterality: Right;     A IV Location/Drains/Wounds Patient Lines/Drains/Airways Status     Active Line/Drains/Airways     Name Placement date Placement time Site Days   Peripheral IV 04/08/23 20 G Right Antecubital 04/08/23  1545  Antecubital  1   Y Chest Tube 1 and 2 05/17/17  1640  -- 2153   Wound / Incision (Open or Dehisced) 10/26/22 Other (Comment) Pretibial Distal;Left Anterior Lower leg/ Shin 10/26/22  0021  Pretibial  165            Intake/Output Last 24 hours  Intake/Output Summary (Last 24 hours) at 04/09/2023 1056 Last data filed at 04/09/2023 0208 Gross per 24 hour  Intake 37.74 ml  Output 3300 ml  Net -3262.26 ml    Labs/Imaging Results for orders placed or performed during the hospital encounter of 04/08/23 (from the past 48 hours)  Resp panel by RT-PCR (RSV, Flu A&B, Covid) Anterior Nasal Swab     Status: None   Collection Time: 04/08/23  3:10 PM   Specimen: Anterior Nasal Swab  Result Value Ref Range   SARS Coronavirus 2 by RT PCR NEGATIVE NEGATIVE   Influenza A by PCR NEGATIVE NEGATIVE   Influenza B by PCR NEGATIVE NEGATIVE    Comment: (NOTE) The Xpert Xpress SARS-CoV-2/FLU/RSV plus assay is intended as an aid in the diagnosis of influenza from Nasopharyngeal swab specimens and should not be used as a sole basis for treatment. Nasal washings and aspirates are unacceptable for Xpert Xpress SARS-CoV-2/FLU/RSV testing.  Fact Sheet for Patients: bloggercourse.com  Fact Sheet for Healthcare Providers: seriousbroker.it  This test is not yet approved or cleared by the [place]  FDA and has been  authorized for detection and/or diagnosis of SARS-CoV-2 by FDA under an Emergency Use Authorization (EUA). This EUA will remain in effect (meaning this test can be used) for the duration of the COVID-19 declaration under Section 564(b)(1) of the Act, 21 U.S.C. section 360bbb-3(b)(1), unless the authorization is terminated or revoked.     Resp Syncytial Virus by PCR NEGATIVE NEGATIVE    Comment: (NOTE) Fact Sheet for Patients: bloggercourse.com  Fact Sheet for Healthcare Providers: seriousbroker.it  This test is not yet approved or cleared by the [place]  FDA and has been authorized for detection and/or diagnosis of SARS-CoV-2 by FDA under an Emergency Use Authorization (EUA). This EUA will remain in effect (meaning this test can be used) for the duration of the COVID-19 declaration under Section 564(b)(1) of the Act, 21 U.S.C. section 360bbb-3(b)(1), unless the authorization is terminated or revoked.  Performed at [facility], [address] [place], [place] [address]   CBC with Differential     Status: Abnormal   Collection Time: 04/08/23  3:31 PM  Result Value Ref Range   WBC 10.9 (H) 4.0 - 10.5 K/uL   RBC 3.38 (L) 4.22 - 5.81 MIL/uL  Hemoglobin 10.6 (L) 13.0 - 17.0 g/dL   HCT 65.8 (L) 60.9 - 47.9 %   MCV 100.9 (H) 80.0 - 100.0 fL   MCH 31.4 26.0 - 34.0 pg   MCHC 31.1 30.0 - 36.0 g/dL   RDW 85.3 88.4 - 84.4 %   Platelets 292 150 - 400 K/uL   nRBC 0.0 0.0 - 0.2 %   Neutrophils Relative % 82 %   Neutro Abs 8.9 (H) 1.7 - 7.7 K/uL   Lymphocytes Relative 10 %   Lymphs Abs 1.1 0.7 - 4.0 K/uL   Monocytes Relative 7 %   Monocytes Absolute 0.7 0.1 - 1.0 K/uL   Eosinophils Relative 0 %   Eosinophils Absolute 0.0 0.0 - 0.5 K/uL   Basophils Relative 0 %   Basophils Absolute 0.0 0.0 - 0.1 K/uL   Immature Granulocytes 1 %   Abs Immature Granulocytes 0.05 0.00 - 0.07 K/uL    Comment: Performed at [facility]  Lab, [address], [place], [place] [address]  Comprehensive metabolic panel     Status: Abnormal   Collection Time: 04/08/23  3:31 PM  Result Value Ref Range   Sodium 137 135 - 145 mmol/L   Potassium 4.3 3.5 - 5.1 mmol/L   Chloride 101 98 - 111 mmol/L   CO2 17 (L) 22 - 32 mmol/L   Glucose, Bld 128 (H) 70 - 99 mg/dL    Comment: Glucose reference range applies only to samples taken after fasting for at least 8 hours.   BUN 82 (H) 6 - 20 mg/dL   Creatinine, Ser 85.30 (H) 0.61 - 1.24 mg/dL   Calcium  9.4 8.9 - 10.3 mg/dL   Total Protein 6.7 6.5 - 8.1 g/dL   Albumin 3.1 (L) 3.5 - 5.0 g/dL   AST 14 (L) 15 - 41 U/L   ALT 9 0 - 44 U/L   Alkaline Phosphatase 67 38 - 126 U/L   Total Bilirubin 0.6 0.0 - 1.2 mg/dL   GFR, Estimated 4 (L) >60 mL/min    Comment: (NOTE) Calculated using the CKD-EPI Creatinine Equation (2021)    Anion gap 19 (H) 5 - 15    Comment: Performed at [facility], [address], [place], [place] [address]  Troponin I (High Sensitivity)     Status: Abnormal   Collection Time: 04/08/23  3:31 PM  Result Value Ref Range   Troponin I (High Sensitivity) 101 (HH) <18 ng/L    Comment: CRITICAL RESULT CALLED TO, READ BACK BY AND VERIFIED WITH J NEWTON RN 04/08/2023 1704 BNUNNERY (NOTE) Elevated high sensitivity troponin I (hsTnI) values and significant  changes across serial measurements may suggest ACS but many other  chronic and acute conditions are known to elevate hsTnI results.  Refer to the Links section for chest pain algorithms and additional  guidance. Performed at [facility], [address] [place], [place] [address]   Brain natriuretic peptide     Status: Abnormal   Collection Time: 04/08/23  3:31 PM  Result Value Ref Range   B Natriuretic Peptide 846.2 (H) 0.0 - 100.0 pg/mL    Comment: Performed at [facility], [address] [place], [place] [address]  Magnesium     Status: Abnormal   Collection Time: 04/08/23  3:31 PM  Result Value  Ref Range   Magnesium 3.0 (H) 1.7 - 2.4 mg/dL    Comment: Performed at [facility], [address], [place], [place]  [address]  Troponin I (High Sensitivity)     Status: Abnormal   Collection Time: 04/08/23  6:46 PM  Result Value Ref Range   Troponin I (High Sensitivity) 98 (H) <18 ng/L    Comment: (NOTE) Elevated high sensitivity troponin I (hsTnI) values and significant  changes across serial measurements may suggest ACS but many other  chronic and acute conditions are known to elevate hsTnI results.  Refer to the Links section for chest pain algorithms and additional  guidance. Performed at [facility], [address] [place], [place] [address]   Hepatitis B surface antigen     Status: None   Collection Time: 04/08/23  6:46 PM  Result Value Ref Range   Hepatitis B Surface Ag NON REACTIVE NON REACTIVE    Comment: Performed at [facility], [address], [place], [place] [address]  HIV Antibody (routine testing w rflx)     Status: None   Collection Time: 04/08/23  6:54 PM  Result Value Ref Range   HIV Screen 4th Generation wRfx Non Reactive Non Reactive    Comment: Performed at [facility], [address] [place], [place] [address]  Renal function panel     Status: Abnormal   Collection Time: 04/09/23  6:13 AM  Result Value Ref Range   Sodium 135 135 - 145 mmol/L   Potassium 4.4 3.5 - 5.1 mmol/L   Chloride 97 (L) 98 - 111 mmol/L   CO2 25 22 - 32 mmol/L   Glucose, Bld 144 (H) 70 - 99 mg/dL    Comment: Glucose reference range applies only to samples taken after fasting for at least 8 hours.   BUN 44 (H) 6 - 20 mg/dL   Creatinine, Ser 1.42 (H) 0.61 - 1.24 mg/dL   Calcium  9.1 8.9 - 10.3 mg/dL   Phosphorus 6.4 (H) 2.5 - 4.6 mg/dL   Albumin 2.7 (L) 3.5 - 5.0 g/dL   GFR, Estimated 7 (L) >60 mL/min    Comment: (NOTE) Calculated using the CKD-EPI Creatinine Equation (2021)    Anion gap 13 5 - 15    Comment: Performed at [facility],  [address], [place], [place] [address]  CBC     Status: Abnormal   Collection Time: 04/09/23  6:13 AM  Result Value Ref Range   WBC 9.0 4.0 - 10.5 K/uL   RBC 3.24 (L) 4.22 - 5.81 MIL/uL   Hemoglobin 9.9 (L) 13.0 - 17.0 g/dL   HCT 68.8 (L) 60.9 - 47.9 %   MCV 96.0 80.0 - 100.0 fL   MCH 30.6 26.0 - 34.0 pg   MCHC 31.8 30.0 - 36.0 g/dL   RDW 85.4 88.4 - 84.4 %   Platelets 269 150 - 400 K/uL   nRBC 0.0 0.0 - 0.2 %    Comment: Performed at [facility], [address], [place], [place] [address]  Magnesium     Status: Abnormal   Collection Time: 04/09/23  6:13 AM  Result Value Ref Range   Magnesium 2.5 (H) 1.7 - 2.4 mg/dL    Comment: Performed at [facility], [address] [place], [place] [address]  CBG monitoring, ED     Status: Abnormal   Collection Time: 04/09/23  7:39 AM  Result Value Ref Range   Glucose-Capillary 111 (H) 70 - 99 mg/dL    Comment: Glucose reference range applies only to samples taken after fasting for at least 8 hours.  DG Chest Portable 1 View Result Date: 04/08/2023 CLINICAL DATA:  SOB. EXAM: PORTABLE CHEST 1 VIEW COMPARISON:  02/15/2023. FINDINGS: Alveolar consolidation right hemithorax consistent with a pneumonia or pulmonary edema. Diffuse pulmonary vascular congestion. Prominent cardiac silhouette. No pneumothorax. Aorta is calcified. IMPRESSION: Findings suggest CHF with possible superimposed right-sided pneumonia or asymmetric edema that is worse on the right. Electronically Signed   By: Fonda Field M.D.   On: 04/08/2023 16:52    Pending Labs Unresulted Labs (From admission, onward)     Start     Ordered   04/08/23 1911  Hemoglobin A1c  Once,   R        04/08/23 1910   04/08/23 1802  Hepatitis B surface antibody,quantitative  (New Admission Hemo Labs (Hepatitis B))  Once,   URGENT        04/08/23 1802   Signed and Held  Renal function panel  Once,   R        Signed and Held   Signed and Held  CBC  Once,   R        Signed and Held    Signed and Held  Renal function panel  Once,   R        Signed and Held   Signed and Held  CBC  Once,   R        Signed and Held            Vitals/Pain Today's Vitals   04/09/23 0915 04/09/23 0916 04/09/23 0930 04/09/23 1005  BP:   (!) 154/88   Pulse: 73 73 75 76  Resp: 20 16 20 17   Temp:    (!) 97.3 F (36.3 C)  TempSrc:    Temporal  SpO2: 99% 99% 99% 100%  PainSc:        Isolation Precautions No active isolations  Medications Medications  Chlorhexidine  Gluconate Cloth 2 % PADS 6 each (6 each Topical Not Given 04/09/23 0516)  heparin  injection 5,000 Units (5,000 Units Subcutaneous Given 04/09/23 0742)  acetaminophen  (TYLENOL ) tablet 650 mg (650 mg Oral Given 04/09/23 0146)    Or  acetaminophen  (TYLENOL ) suppository 650 mg ( Rectal See Alternative 04/09/23 0146)  polyethylene glycol (MIRALAX  / GLYCOLAX ) packet 17 g (has no administration in time range)  insulin  aspart (novoLOG ) injection 0-6 Units ( Subcutaneous Not Given 04/09/23 0740)  carvedilol  (COREG ) tablet 25 mg (25 mg Oral Given 04/09/23 0742)  hydrALAZINE  (APRESOLINE ) tablet 50 mg (50 mg Oral Given 04/09/23 0941)  amLODipine  (NORVASC ) tablet 10 mg (10 mg Oral Given 04/09/23 0941)  sevelamer  carbonate (RENVELA ) tablet 2,400 mg (2,400 mg Oral Given 04/09/23 0742)  gabapentin  (NEURONTIN ) capsule 300 mg (300 mg Oral Given 04/09/23 0941)  Chlorhexidine  Gluconate Cloth 2 % PADS 6 each (has no administration in time range)  Darbepoetin Alfa  (ARANESP ) injection 40 mcg (has no administration in time range)  ipratropium-albuterol  (DUONEB) 0.5-2.5 (3) MG/3ML nebulizer solution 3 mL (3 mLs Nebulization Given 04/08/23 1614)    Mobility walks     Focused Assessments    R Recommendations: See Admitting Provider Note  Report given to:   Additional Notes:

## 2023-04-09 NOTE — Assessment & Plan Note (Signed)
Hemoglobin stable relative to baseline 

## 2023-04-09 NOTE — Assessment & Plan Note (Signed)
Blood pressure improved with dialysis - Continue Coreg, Amlodipine, hydralazine

## 2023-04-09 NOTE — Progress Notes (Signed)
   04/09/23 2100  BiPAP/CPAP/SIPAP  Reason BIPAP/CPAP not in use Other(comment) (Pt in no respiratory distress at this time. BiPAP not needed.)  BiPAP/CPAP /SiPAP Vitals  Pulse Rate 73  Resp 18  MEWS Score/Color  MEWS Score 0  MEWS Score Color Green

## 2023-04-10 DIAGNOSIS — J9601 Acute respiratory failure with hypoxia: Secondary | ICD-10-CM | POA: Diagnosis not present

## 2023-04-10 LAB — GLUCOSE, CAPILLARY
Glucose-Capillary: 88 mg/dL (ref 70–99)
Glucose-Capillary: 162 mg/dL — ABNORMAL HIGH (ref 70–99)
Glucose-Capillary: 109 mg/dL — ABNORMAL HIGH (ref 70–99)

## 2023-04-10 LAB — RENAL FUNCTION PANEL
Albumin: 2.7 g/dL — ABNORMAL LOW (ref 3.5–5.0)
Anion gap: 14 (ref 5–15)
BUN: 59 mg/dL — ABNORMAL HIGH (ref 6–20)
CO2: 24 mmol/L (ref 22–32)
Calcium: 9.4 mg/dL (ref 8.9–10.3)
Chloride: 96 mmol/L — ABNORMAL LOW (ref 98–111)
Creatinine, Ser: 10.37 mg/dL — ABNORMAL HIGH (ref 0.61–1.24)
GFR, Estimated: 5 mL/min — ABNORMAL LOW (ref >60)
Glucose, Bld: 123 mg/dL — ABNORMAL HIGH (ref 70–99)
Phosphorus: 7.6 mg/dL — ABNORMAL HIGH (ref 2.5–4.6)
Potassium: 4.5 mmol/L (ref 3.5–5.1)
Sodium: 134 mmol/L — ABNORMAL LOW (ref 135–145)

## 2023-04-10 LAB — CBC
HCT: 30.1 % — ABNORMAL LOW (ref 39.0–52.0)
Hemoglobin: 10 g/dL — ABNORMAL LOW (ref 13.0–17.0)
MCH: 31.3 pg (ref 26.0–34.0)
MCHC: 33.2 g/dL (ref 30.0–36.0)
MCV: 94.1 fL (ref 80.0–100.0)
Platelets: 278 10*3/uL (ref 150–400)
RBC: 3.2 MIL/uL — ABNORMAL LOW (ref 4.22–5.81)
RDW: 14.2 % (ref 11.5–15.5)
WBC: 8 10*3/uL (ref 4.0–10.5)
nRBC: 0 % (ref 0.0–0.2)

## 2023-04-10 LAB — HEMOGLOBIN A1C
Hgb A1c MFr Bld: 6.7 % — ABNORMAL HIGH (ref 4.8–5.6)
Mean Plasma Glucose: 146 mg/dL

## 2023-04-10 LAB — HEPATITIS B SURFACE ANTIBODY, QUANTITATIVE: Hep B S AB Quant (Post): 672 m[IU]/mL

## 2023-04-10 MED ORDER — HEPARIN SODIUM (PORCINE) 1000 UNIT/ML DIALYSIS
5000.0000 [IU] | Freq: Once | INTRAMUSCULAR | Status: AC
  Administered 2023-04-10: 5000 [IU] via INTRAVENOUS_CENTRAL
  Filled 2023-04-10 (×2): qty 5

## 2023-04-10 NOTE — Procedures (Signed)
 I was present at this dialysis session. I have reviewed the session itself and made appropriate changes.   Vital signs in last 24 hours:  Temp:  [97.3 F (36.3 C)-98.3 F (36.8 C)] 98 F (36.7 C) (01/01 0813) Pulse Rate:  [66-76] 67 (01/01 0900) Resp:  [16-25] 18 (01/01 0900) BP: (113-162)/(66-100) 160/93 (01/01 0900) SpO2:  [92 %-100 %] 99 % (01/01 0900) Weight:  [103 kg-109.5 kg] 103 kg (01/01 0815) Weight change:  Filed Weights   04/10/23 0421 04/10/23 0815  Weight: 109.5 kg 103 kg    Recent Labs  Lab 04/10/23 0735  NA 134*  K 4.5  CL 96*  CO2 24  GLUCOSE 123*  BUN 59*  CREATININE 10.37*  CALCIUM  9.4  PHOS 7.6*    Recent Labs  Lab 04/08/23 1531 04/09/23 0613 04/10/23 0735  WBC 10.9* 9.0 8.0  NEUTROABS 8.9*  --   --   HGB 10.6* 9.9* 10.0*  HCT 34.1* 31.1* 30.1*  MCV 100.9* 96.0 94.1  PLT 292 269 278    Scheduled Meds:  amLODipine   10 mg Oral Daily   carvedilol   25 mg Oral BID WC   Chlorhexidine  Gluconate Cloth  6 each Topical Q0600   Chlorhexidine  Gluconate Cloth  6 each Topical Q0600   darbepoetin (ARANESP ) injection - DIALYSIS  40 mcg Subcutaneous Q Wed-1800   dextromethorphan -guaiFENesin   1 tablet Oral BID   gabapentin   300 mg Oral Daily   heparin   5,000 Units Subcutaneous Q8H   hydrALAZINE   50 mg Oral TID   insulin  aspart  0-6 Units Subcutaneous TID WC   sevelamer  carbonate  2,400 mg Oral TID WC   Continuous Infusions: PRN Meds:.acetaminophen  **OR** acetaminophen , polyethylene glycol, traMADol    Fairy DELENA Sellar,  MD 04/10/2023, 9:06 AM

## 2023-04-10 NOTE — Progress Notes (Signed)
 Received patient in bed to unit.  Alert and oriented.  Informed consent signed and in chart.   TX duration:3.5  Patient tolerated well.  Transported back to the room  Alert, without acute distress.  Hand-off given to patient's nurse.   Access used: left AVF Access issues: none  Total UF removed: 3.5L Medication(s) given: none   04/10/23 1208  Vitals  Temp 97.9 F (36.6 C)  Temp Source Oral  BP (!) 172/94  MAP (mmHg) 117  BP Location Right Arm  BP Method Automatic  Patient Position (if appropriate) Lying  Pulse Rate 71  Pulse Rate Source Monitor  ECG Heart Rate 71  Resp 16  Oxygen Therapy  SpO2 99 %  O2 Device Nasal Cannula  O2 Flow Rate (L/min) 2 L/min  During Treatment Monitoring  HD Safety Checks Performed Yes  Intra-Hemodialysis Comments Tx completed;Tolerated well  Dialysis Fluid Bolus Normal Saline  Bolus Amount (mL) 300 mL      [name]

## 2023-04-10 NOTE — Progress Notes (Signed)
 PROGRESS NOTE                                                                                                                                                                                                             Patient Demographics:    [name], is a 58 y.o. male, DOB - Aug 22, 1965, FMW:[id number]  Outpatient Primary MD for the patient is Daye, Deneda T, FNP    LOS - 1  Admit date - 04/08/2023    No chief complaint on file.      Brief Narrative (HPI from H&P)   58 y.o. M with ESRD on HD MWF, HFpEF, pAF not on AC, HTN, and DM who presented with SOB for 1 day after missing a dialysis session because of miscommunication about the holiday schedule.    Subjective:    Mizael Granato today has, No headache, No chest pain, No abdominal pain - No Nausea, No new weakness tingling or numbness, +ve dry cough and shortness of breath.   Assessment  & Plan :    Acute hypoxic respiratory failure (HCC) caused by fluid overload from missed dialysis session in a patient with ESRD - The patient presented with dyspnea, tachypnea, chest x-ray with no infiltrates, and requiring BiPAP for respiratory support.  He underwent HD on 04/08/2024, removed from BiPAP but still on 3 to 4 L nasal cannula oxygen with orthopnea and dry cough, another HD session on 04/10/2023, encouraged to sit in chair use I-S and flutter valve for pulmonary toiletry.  Advance activity and titrate of oxygen if he comes off of oxygen then discharge home today or else another HD session tomorrow.  SpO2: 98 % O2 Flow Rate (L/min): 3 L/min FiO2 (%): 50 %   ESRD (end stage renal disease) (HCC) - Prodigy on board undergoing HD sessions - Continue Aranesp , Renvela  - Resume Rocaltrol   AF (paroxysmal atrial fibrillation) (HCC) Rate controlled - Continue carvedilol , not on chronic anticoagulation defer that to primary care physician and primary cardiologist  Anemia in  chronic kidney disease (CKD) Hemoglobin stable relative to baseline  Chronic heart failure with preserved ejection fraction (HCC) - Volume management with dialysis  Myocardial injury Elevated troponin due to demand mismatch in the setting of renal failure and hypertensive urgency and respiratory failure.  No chest pain or ECG changes or other ischemic signs.  Chest pain-free no acute issues.  Severe uncontrolled hypertension Blood pressure improved with dialysis - Continue Coreg , Amlodipine , hydralazine   DMII (diabetes mellitus, type 2) (HCC) Glucose controlled.  Does not appear to be on insulin  at home. - Continue sliding scale corrections - Continue home gabapentin   Lab Results  Component Value Date   HGBA1C 6.7 (H) 04/09/2023   CBG (last 3)  Recent Labs    04/09/23 1146 04/09/23 1602 04/09/23 2123  GLUCAP 129* 119* 170*    Obesity: BMI 37: Follow-up with PCP.          Condition - Fair  Family Communication  :  None  Code Status :  Full  Consults  :  Renal  PUD Prophylaxis :     Procedures  :            Disposition Plan  :    Status is: Inpatient   DVT Prophylaxis  :    heparin  injection 5,000 Units Start: 04/08/23 2200    Lab Results  Component Value Date   PLT 278 04/10/2023    Diet :  Diet Order             Diet renal with fluid restriction Fluid restriction: 1200 mL Fluid; Room service appropriate? Yes; Fluid consistency: Thin  Diet effective now                    Inpatient Medications  Scheduled Meds:  amLODipine   10 mg Oral Daily   carvedilol   25 mg Oral BID WC   Chlorhexidine  Gluconate Cloth  6 each Topical Q0600   Chlorhexidine  Gluconate Cloth  6 each Topical Q0600   darbepoetin (ARANESP ) injection - DIALYSIS  40 mcg Subcutaneous Q Wed-1800   dextromethorphan -guaiFENesin   1 tablet Oral BID   gabapentin   300 mg Oral Daily   heparin   5,000 Units Subcutaneous Q8H   heparin   5,000 Units Dialysis Once in dialysis    hydrALAZINE   50 mg Oral TID   insulin  aspart  0-6 Units Subcutaneous TID WC   sevelamer  carbonate  2,400 mg Oral TID WC   Continuous Infusions: PRN Meds:.acetaminophen  **OR** acetaminophen , polyethylene glycol, traMADol   Antibiotics  :    Anti-infectives (From admission, onward)    None         Objective:   Vitals:   04/09/23 2100 04/09/23 2325 04/10/23 0421 04/10/23 0813  BP:  138/88 125/66 (!) 153/87  Pulse: 73 66  69  Resp: 18 19 (!) 21 19  Temp:  (!) 97.5 F (36.4 C) 98 F (36.7 C) 98 F (36.7 C)  TempSrc:  Oral Oral Oral  SpO2:  98% 93% 98%  Weight:   109.5 kg     Wt Readings from Last 3 Encounters:  04/10/23 109.5 kg  02/19/23 94.6 kg  01/24/23 95.4 kg     Intake/Output Summary (Last 24 hours) at 04/10/2023 0814 Last data filed at 04/09/2023 1300 Gross per 24 hour  Intake 240 ml  Output --  Net 240 ml     Physical Exam  Awake Alert, No new F.N deficits, Normal affect .AT,PERRAL Supple Neck, No JVD,   Symmetrical Chest wall movement, Good air movement bilaterally, ++ rales RRR,No Gallops,Rubs or new Murmurs,  +ve B.Sounds, Abd Soft, No tenderness,   No Cyanosis, Clubbing or edema       Data Review:    Recent Labs  Lab 04/08/23 1531 04/09/23 0613 04/10/23 0735  WBC 10.9* 9.0 8.0  HGB 10.6* 9.9* 10.0*  HCT 34.1* 31.1* 30.1*  PLT 292 269 278  MCV 100.9* 96.0 94.1  MCH 31.4 30.6 31.3  MCHC 31.1 31.8 33.2  RDW 14.6 14.5 14.2  LYMPHSABS 1.1  --   --   MONOABS 0.7  --   --   EOSABS 0.0  --   --   BASOSABS 0.0  --   --     Recent Labs  Lab 04/08/23 1531 04/09/23 0613  NA 137 135  K 4.3 4.4  CL 101 97*  CO2 17* 25  ANIONGAP 19* 13  GLUCOSE 128* 144*  BUN 82* 44*  CREATININE 14.69* 8.57*  AST 14*  --   ALT 9  --   ALKPHOS 67  --   BILITOT 0.6  --   ALBUMIN 3.1* 2.7*  HGBA1C  --  6.7*  BNP 846.2*  --   MG 3.0* 2.5*  CALCIUM  9.4 9.1      Recent Labs  Lab 04/08/23 1531 04/09/23 0613  HGBA1C  --  6.7*  BNP 846.2*   --   MG 3.0* 2.5*  CALCIUM  9.4 9.1     Radiology Reports DG Chest Portable 1 View Result Date: 04/08/2023 CLINICAL DATA:  SOB. EXAM: PORTABLE CHEST 1 VIEW COMPARISON:  02/15/2023. FINDINGS: Alveolar consolidation right hemithorax consistent with a pneumonia or pulmonary edema. Diffuse pulmonary vascular congestion. Prominent cardiac silhouette. No pneumothorax. Aorta is calcified. IMPRESSION: Findings suggest CHF with possible superimposed right-sided pneumonia or asymmetric edema that is worse on the right. Electronically Signed   By: Fonda Field M.D.   On: 04/08/2023 16:52      Signature  -   Lavada Stank M.D on 04/10/2023 at 8:14 AM   -  To page go to [email or web address]

## 2023-04-10 NOTE — Plan of Care (Signed)

## 2023-04-11 DIAGNOSIS — J9601 Acute respiratory failure with hypoxia: Secondary | ICD-10-CM | POA: Diagnosis not present

## 2023-04-11 LAB — GLUCOSE, CAPILLARY: Glucose-Capillary: 122 mg/dL — ABNORMAL HIGH (ref 70–99)

## 2023-04-11 NOTE — Discharge Summary (Signed)
 [name] FMW:[id number] DOB: 1965-12-09 DOA: 04/08/2023  PCP: Rik Glinda DASEN, FNP  Admit date: 04/08/2023  Discharge date: 04/11/2023  Admitted From: Home   Disposition:  Home   Recommendations for Outpatient Follow-up:   Follow up with PCP in 1-2 weeks  PCP Please obtain BMP/CBC, 2 view CXR in 1week,  (see Discharge instructions)   PCP Please follow up on the following pending results:    Home Health: None   Equipment/Devices: None  Consultations: Renal Discharge Condition: Stable    CODE STATUS: Full    Diet Recommendation: Renal diet with 1200 cc fluid restriction per day  No chief complaint on file.    Brief history of present illness from the day of admission and additional interim summary    58 y.o. M with ESRD on HD MWF, HFpEF, pAF not on AC, HTN, and DM who presented with SOB for 1 day after missing a dialysis session because of miscommunication about the holiday schedule.                                                                  Hospital Course   Acute hypoxic respiratory failure (HCC) caused by fluid overload from missed dialysis session in a patient with ESRD - The patient presented with dyspnea, tachypnea, chest x-ray with no infiltrates, and requiring BiPAP for respiratory support.  He underwent HD on 04/08/2024, removed from BiPAP but still on 3 to 4 L nasal cannula oxygen with orthopnea and dry cough, another HD session on 04/10/2023, encouraged to sit in chair use I-S and flutter valve for pulmonary toiletry.  Advance activity and titrate of oxygen he is now on room air and symptom-free.  Will be discharged home.   ESRD (end stage renal disease) (HCC) - Prodigy on board undergoing HD sessions - Continue Aranesp , Renvela  - Resume Rocaltrol    AF (paroxysmal atrial fibrillation)  (HCC) Rate controlled - Continue carvedilol , not on chronic anticoagulation defer that to primary care physician and primary cardiologist   Anemia in chronic kidney disease (CKD) Hemoglobin stable relative to baseline   Chronic heart failure with preserved ejection fraction (HCC) - Volume management with dialysis   Myocardial injury Elevated troponin due to demand mismatch in the setting of renal failure and hypertensive urgency and respiratory failure.  No chest pain or ECG changes or other ischemic signs.  Chest pain-free no acute issues.   Severe uncontrolled hypertension Blood pressure improved with dialysis - Continue Coreg , Amlodipine , hydralazine  per home regimen.   DMII (diabetes mellitus, type 2) (HCC) Glucose controlled.  New home regimen including Neurontin .      Discharge diagnosis     Principal Problem:   Acute hypoxic respiratory failure (HCC) Active Problems:   AF (paroxysmal atrial fibrillation) (HCC)   ESRD (end stage renal disease) (  HCC)   DMII (diabetes mellitus, type 2) (HCC)   Class 2 obesity   Severe uncontrolled hypertension   Myocardial injury   Chronic heart failure with preserved ejection fraction (HCC)   Anemia in chronic kidney disease (CKD)    Discharge instructions    Discharge Instructions     Discharge instructions   Complete by: As directed    Follow with Primary MD Rik Glinda DASEN, FNP in 7 days   Get CBC, CMP, 2 view Chest X ray -  checked next visit with your primary MD   Activity: As tolerated with Full fall precautions use walker/cane & assistance as needed  Disposition Home    Diet: Renal with 1200 cc fluid restriction per day  Special Instructions: If you have smoked or chewed Tobacco  in the last 2 yrs please stop smoking, stop any regular Alcohol  and or any Recreational drug use.  On your next visit with your primary care physician please Get Medicines reviewed and adjusted.  Please request your Prim.MD to go over  all Hospital Tests and Procedure/Radiological results at the follow up, please get all Hospital records sent to your Prim MD by signing hospital release before you go home.  If you experience worsening of your admission symptoms, develop shortness of breath, life threatening emergency, suicidal or homicidal thoughts you must seek medical attention immediately by calling 911 or calling your MD immediately  if symptoms less severe.  You Must read complete instructions/literature along with all the possible adverse reactions/side effects for all the Medicines you take and that have been prescribed to you. Take any new Medicines after you have completely understood and accpet all the possible adverse reactions/side effects.   Do not drive when taking Pain medications.  Do not take more than prescribed Pain, Sleep and Anxiety Medications   Increase activity slowly   Complete by: As directed        Discharge Medications   Allergies as of 04/11/2023       Reactions   Oxycodone  Itching        Medication List     TAKE these medications    Accu-Chek Softclix Lancets lancets Use as directed.  For insulin -dependent type 2 diabetes Monitor  glucose 3 times a day   albuterol  (2.5 MG/3ML) 0.083% nebulizer solution Commonly known as: PROVENTIL  Take 3 mLs (2.5 mg total) by nebulization every 6 (six) hours as needed for wheezing or shortness of breath.   albuterol  108 (90 Base) MCG/ACT inhaler Commonly known as: Ventolin  HFA Inhale 2 puffs into the lungs every 6 (six) hours as needed for wheezing or shortness of breath.   amLODipine  10 MG tablet Commonly known as: NORVASC  Take 1 tablet (10 mg total) by mouth daily.   Breztri  Aerosphere 160-9-4.8 MCG/ACT Aero Generic drug: Budeson-Glycopyrrol-Formoterol  Inhale 2 puffs into the lungs in the morning and at bedtime.   busPIRone  5 MG tablet Commonly known as: BUSPAR  Take 5 mg by mouth 2 (two) times daily.   calcitRIOL  0.25 MCG  capsule Commonly known as: ROCALTROL  Take 1 capsule (0.25 mcg total) by mouth every Monday, Wednesday, and Friday.   camphor-menthol  lotion Commonly known as: SARNA Apply topically 2 (two) times daily. What changed: how much to take   carvedilol  25 MG tablet Commonly known as: COREG  Take 1 tablet (25 mg total) by mouth 2 (two) times daily with a meal.   gabapentin  300 MG capsule Commonly known as: NEURONTIN  Take 1 capsule (300 mg total) by mouth daily.  glucose blood test strip Use as instructed For insulin -dependent type 2 diabetes Monitor  glucose 3 times a day   hydrALAZINE  50 MG tablet Commonly known as: APRESOLINE  Take 1 tablet (50 mg total) by mouth 3 (three) times daily. What changed: additional instructions   hydrOXYzine  25 MG tablet Commonly known as: ATARAX  Take 25 mg by mouth daily.   levocetirizine 5 MG tablet Commonly known as: XYZAL  Take 1 tablet (5 mg total) by mouth every evening.   lidocaine  2 % jelly Commonly known as: XYLOCAINE  Apply 1 Application topically daily.   MIRCERA IJ Inject 1 Dose into the skin every 14 (fourteen) days.   multivitamin Tabs tablet Take 1 tablet by mouth at bedtime.   polyethylene glycol powder 17 GM/SCOOP powder Commonly known as: GLYCOLAX /MIRALAX  Mix as directed and take 1 capful (17 g) by mouth daily.   sevelamer  carbonate 800 MG tablet Commonly known as: RENVELA  Take 3 tablets (2,400 mg total) by mouth 3 (three) times daily with meals.   traMADol  50 MG tablet Commonly known as: ULTRAM  Take 1 tablet (50 mg total) by mouth every 12 (twelve) hours as needed for moderate pain (pain score 4-6).   VENOFER  IV Inject 50 mg into the vein See admin instructions. Given on days of Dialysis: Monday, Wednesday, and Friday.   Vitamin D  (Ergocalciferol ) 1.25 MG (50000 UNIT) Caps capsule Commonly known as: DRISDOL  Take 50,000 Units by mouth See admin instructions. Take one tablet by mouth on Monday, Wednesdays and Fridays  per patient   Xphozah  30 MG Tabs Generic drug: Tenapanor  HCl (CKD) Take 1 tablet by mouth 2 (two) times daily.         Follow-up Information     Rik Glinda DASEN, FNP. Schedule an appointment as soon as possible for a visit in 1 week(s).   Specialty: Family Medicine Contact information: [address] [place] [address] [phone]                 Major procedures and Radiology Reports - PLEASE review detailed and final reports thoroughly  -      DG Chest Portable 1 View Result Date: 04/08/2023 CLINICAL DATA:  SOB. EXAM: PORTABLE CHEST 1 VIEW COMPARISON:  02/15/2023. FINDINGS: Alveolar consolidation right hemithorax consistent with a pneumonia or pulmonary edema. Diffuse pulmonary vascular congestion. Prominent cardiac silhouette. No pneumothorax. Aorta is calcified. IMPRESSION: Findings suggest CHF with possible superimposed right-sided pneumonia or asymmetric edema that is worse on the right. Electronically Signed   By: Fonda Field M.D.   On: 04/08/2023 16:52    Micro Results    Recent Results (from the past 240 hours)  Resp panel by RT-PCR (RSV, Flu A&B, Covid) Anterior Nasal Swab     Status: None   Collection Time: 04/08/23  3:10 PM   Specimen: Anterior Nasal Swab  Result Value Ref Range Status   SARS Coronavirus 2 by RT PCR NEGATIVE NEGATIVE Final   Influenza A by PCR NEGATIVE NEGATIVE Final   Influenza B by PCR NEGATIVE NEGATIVE Final    Comment: (NOTE) The Xpert Xpress SARS-CoV-2/FLU/RSV plus assay is intended as an aid in the diagnosis of influenza from Nasopharyngeal swab specimens and should not be used as a sole basis for treatment. Nasal washings and aspirates are unacceptable for Xpert Xpress SARS-CoV-2/FLU/RSV testing.  Fact Sheet for Patients: bloggercourse.com  Fact Sheet for Healthcare Providers: seriousbroker.it  This test is not yet approved or cleared by the [place]  [place] FDA and has been  authorized for detection and/or diagnosis of SARS-CoV-2 by FDA under an Emergency Use Authorization (EUA). This EUA will remain in effect (meaning this test can be used) for the duration of the COVID-19 declaration under Section 564(b)(1) of the Act, 21 U.S.C. section 360bbb-3(b)(1), unless the authorization is terminated or revoked.     Resp Syncytial Virus by PCR NEGATIVE NEGATIVE Final    Comment: (NOTE) Fact Sheet for Patients: bloggercourse.com  Fact Sheet for Healthcare Providers: seriousbroker.it  This test is not yet approved or cleared by the [place]  FDA and has been authorized for detection and/or diagnosis of SARS-CoV-2 by FDA under an Emergency Use Authorization (EUA). This EUA will remain in effect (meaning this test can be used) for the duration of the COVID-19 declaration under Section 564(b)(1) of the Act, 21 U.S.C. section 360bbb-3(b)(1), unless the authorization is terminated or revoked.  Performed at [facility], [address] [place], [place] [address]     Today   Subjective    [name] today has no headache,no chest abdominal pain,no new weakness tingling or numbness, feels much better wants to go home today.    Objective   Blood pressure (!) 150/86, pulse 66, temperature (P) 98.4 F (36.9 C), temperature source (P) Oral, resp. rate 20, weight 100.5 kg, SpO2 95%.   Intake/Output Summary (Last 24 hours) at 04/11/2023 1023 Last data filed at 04/10/2023 1504 Gross per 24 hour  Intake 240 ml  Output 3500 ml  Net -3260 ml    Exam  Awake Alert, No new F.N deficits,    [place].AT,PERRAL Supple Neck,   Symmetrical Chest wall movement, Good air movement bilaterally, CTAB RRR,No Gallops,   +ve B.Sounds, Abd Soft, Non tender,  No Cyanosis, Clubbing or edema    Data Review   Recent Labs  Lab 04/08/23 1531 04/09/23 0613 04/10/23 0735  WBC 10.9* 9.0 8.0  HGB  10.6* 9.9* 10.0*  HCT 34.1* 31.1* 30.1*  PLT 292 269 278  MCV 100.9* 96.0 94.1  MCH 31.4 30.6 31.3  MCHC 31.1 31.8 33.2  RDW 14.6 14.5 14.2  LYMPHSABS 1.1  --   --   MONOABS 0.7  --   --   EOSABS 0.0  --   --   BASOSABS 0.0  --   --     Recent Labs  Lab 04/08/23 1531 04/09/23 0613 04/10/23 0735  NA 137 135 134*  K 4.3 4.4 4.5  CL 101 97* 96*  CO2 17* 25 24  ANIONGAP 19* 13 14  GLUCOSE 128* 144* 123*  BUN 82* 44* 59*  CREATININE 14.69* 8.57* 10.37*  AST 14*  --   --   ALT 9  --   --   ALKPHOS 67  --   --   BILITOT 0.6  --   --   ALBUMIN 3.1* 2.7* 2.7*  HGBA1C  --  6.7*  --   BNP 846.2*  --   --   MG 3.0* 2.5*  --   CALCIUM  9.4 9.1 9.4    Total Time in preparing paper work, data evaluation and todays exam - 35 minutes  Signature  -    Lavada Stank M.D on 04/11/2023 at 10:23 AM   -  To page go to [email or web address]

## 2023-04-11 NOTE — Plan of Care (Signed)

## 2023-04-11 NOTE — Discharge Instructions (Signed)
 Follow with Primary MD Rik Glinda DASEN, FNP in 7 days   Get CBC, CMP, 2 view Chest X ray -  checked next visit with your primary MD   Activity: As tolerated with Full fall precautions use walker/cane & assistance as needed  Disposition Home    Diet: Renal with 1200 cc fluid restriction per day  Special Instructions: If you have smoked or chewed Tobacco  in the last 2 yrs please stop smoking, stop any regular Alcohol  and or any Recreational drug use.  On your next visit with your primary care physician please Get Medicines reviewed and adjusted.  Please request your Prim.MD to go over all Hospital Tests and Procedure/Radiological results at the follow up, please get all Hospital records sent to your Prim MD by signing hospital release before you go home.  If you experience worsening of your admission symptoms, develop shortness of breath, life threatening emergency, suicidal or homicidal thoughts you must seek medical attention immediately by calling 911 or calling your MD immediately  if symptoms less severe.  You Must read complete instructions/literature along with all the possible adverse reactions/side effects for all the Medicines you take and that have been prescribed to you. Take any new Medicines after you have completely understood and accpet all the possible adverse reactions/side effects.   Do not drive when taking Pain medications.  Do not take more than prescribed Pain, Sleep and Anxiety Medications

## 2023-04-11 NOTE — Plan of Care (Signed)
  Problem: Education: Goal: Ability to describe self-care measures that may prevent or decrease complications (Diabetes Survival Skills Education) will improve Outcome: Completed/Met Goal: Individualized Educational Video(s) Outcome: Completed/Met   Problem: Coping: Goal: Ability to adjust to condition or change in health will improve Outcome: Completed/Met   Problem: Fluid Volume: Goal: Ability to maintain a balanced intake and output will improve Outcome: Completed/Met   Problem: Health Behavior/Discharge Planning: Goal: Ability to identify and utilize available resources and services will improve Outcome: Completed/Met Goal: Ability to manage health-related needs will improve Outcome: Completed/Met   Problem: Metabolic: Goal: Ability to maintain appropriate glucose levels will improve Outcome: Completed/Met   Problem: Nutritional: Goal: Maintenance of adequate nutrition will improve Outcome: Completed/Met Goal: Progress toward achieving an optimal weight will improve Outcome: Completed/Met   Problem: Skin Integrity: Goal: Risk for impaired skin integrity will decrease Outcome: Completed/Met   Problem: Tissue Perfusion: Goal: Adequacy of tissue perfusion will improve Outcome: Completed/Met   Problem: Education: Goal: Knowledge of General Education information will improve Description: Including pain rating scale, medication(s)/side effects and non-pharmacologic comfort measures Outcome: Completed/Met   Problem: Health Behavior/Discharge Planning: Goal: Ability to manage health-related needs will improve Outcome: Completed/Met   Problem: Clinical Measurements: Goal: Ability to maintain clinical measurements within normal limits will improve Outcome: Completed/Met Goal: Will remain free from infection Outcome: Completed/Met Goal: Diagnostic test results will improve Outcome: Completed/Met Goal: Respiratory complications will improve Outcome: Completed/Met Goal:  Cardiovascular complication will be avoided Outcome: Completed/Met   Problem: Activity: Goal: Risk for activity intolerance will decrease Outcome: Completed/Met   Problem: Nutrition: Goal: Adequate nutrition will be maintained Outcome: Completed/Met   Problem: Coping: Goal: Level of anxiety will decrease Outcome: Completed/Met   Problem: Elimination: Goal: Will not experience complications related to bowel motility Outcome: Completed/Met Goal: Will not experience complications related to urinary retention Outcome: Completed/Met   Problem: Pain Management: Goal: General experience of comfort will improve Outcome: Completed/Met   Problem: Safety: Goal: Ability to remain free from injury will improve Outcome: Completed/Met   Problem: Skin Integrity: Goal: Risk for impaired skin integrity will decrease Outcome: Completed/Met

## 2023-04-11 NOTE — Progress Notes (Signed)
 [facility] Progress Note   Subjective:   Pt is on RA, denies SOB at present. Denies CP, dizziness, palpitations, nausea. Reports last 30 min of HD are hard for him because his BP drops and he feels sick. He is hoping to change to home HD soon so he can do shorter treatments 4 days per week, reports his outpatient nephrologist is aware.   Objective Vitals:   04/10/23 2322 04/11/23 0455 04/11/23 0613 04/11/23 0800  BP: 119/85 119/81  (!) 150/86  Pulse: 69 66 67 66  Resp: (!) 23  17 20   Temp: 98 F (36.7 C) 98.1 F (36.7 C)  (P) 98.4 F (36.9 C)  TempSrc: Oral Oral  (P) Oral  SpO2: 95% 98% 96% 95%  Weight:       Physical Exam General: Alert male in NAD Heart: RRR, no murmurs, rubs or gallops Lungs: CTA bilaterally, respirations unlabored on RA Abdomen: Soft, non-distended, +BS Extremities: No edema b/l lower extremities Dialysis Access: LUE AVF + t/b  Additional Objective Labs: Basic Metabolic Panel: Recent Labs  Lab 04/08/23 1531 04/09/23 0613 04/10/23 0735  NA 137 135 134*  K 4.3 4.4 4.5  CL 101 97* 96*  CO2 17* 25 24  GLUCOSE 128* 144* 123*  BUN 82* 44* 59*  CREATININE 14.69* 8.57* 10.37*  CALCIUM  9.4 9.1 9.4  PHOS  --  6.4* 7.6*   Liver Function Tests: Recent Labs  Lab 04/08/23 1531 04/09/23 0613 04/10/23 0735  AST 14*  --   --   ALT 9  --   --   ALKPHOS 67  --   --   BILITOT 0.6  --   --   PROT 6.7  --   --   ALBUMIN 3.1* 2.7* 2.7*   No results for input(s): LIPASE, AMYLASE in the last 168 hours. CBC: Recent Labs  Lab 04/08/23 1531 04/09/23 0613 04/10/23 0735  WBC 10.9* 9.0 8.0  NEUTROABS 8.9*  --   --   HGB 10.6* 9.9* 10.0*  HCT 34.1* 31.1* 30.1*  MCV 100.9* 96.0 94.1  PLT 292 269 278   Blood Culture    Component Value Date/Time   SDES BLOOD RIGHT ANTECUBITAL 11/30/2022 1726   SPECREQUEST  11/30/2022 1726    BOTTLES DRAWN AEROBIC AND ANAEROBIC Blood Culture adequate volume   CULT  11/30/2022 1726    NO GROWTH 5  DAYS Performed at [facility], [address] [place], [place] [address]    REPTSTATUS 12/05/2022 FINAL 11/30/2022 1726    Cardiac Enzymes: No results for input(s): CKTOTAL, CKMB, CKMBINDEX, TROPONINI in the last 168 hours. CBG: Recent Labs  Lab 04/09/23 2123 04/10/23 1328 04/10/23 1644 04/10/23 2130 04/11/23 0809  GLUCAP 170* 88 162* 109* 122*   Iron Studies: No results for input(s): IRON, TIBC, TRANSFERRIN, FERRITIN in the last 72 hours. @lablastinr3 @ Studies/Results: No results found. Medications:   amLODipine   10 mg Oral Daily   carvedilol   25 mg Oral BID WC   Chlorhexidine  Gluconate Cloth  6 each Topical Q0600   Chlorhexidine  Gluconate Cloth  6 each Topical Q0600   darbepoetin (ARANESP ) injection - DIALYSIS  40 mcg Subcutaneous Q Wed-1800   dextromethorphan -guaiFENesin   1 tablet Oral BID   gabapentin   300 mg Oral Daily   heparin   5,000 Units Subcutaneous Q8H   hydrALAZINE   50 mg Oral TID   insulin  aspart  0-6 Units Subcutaneous TID WC   sevelamer  carbonate  2,400 mg Oral TID WC  Dialysis Orders: MWF  - SW  4.25 hrs, BFR 400, DFR AF 1.5,  EDW 95.5 kg, 2K/ 2Ca   Access: AVF  Heparin  5000 unit initial, 2000 unit intermittent Mircera 75 mcg q2wks - last 01/03/23 Hectorol  5mcg IV qHD   Sensipar  180mg  QHD  Assessment/Plan: Acute hypoxic respiratory failure - pulmonary edema on CXR, 2/2 missed HD + large fluid gains. He has been off schedule with his dialysis lately and missed last session on Sunday. Improved s/p HD x2. Reinforced importance of compliance with HD, would be a good candidate for home therapies- is working on this with his nephrologist.  Chest Pain-likely secondary to demand ischemia. Per PMD. Resolved today.   ESRD -  on MWF.  Next HD tomorrow.   Hypertension  - Severe HTN in ED, required nitro drip. Improved s/p HD, continue home antihypertensives.   Anemia of CKD - Hgb 10, restart ESA with HD  Secondary Hyperparathyroidism -   Calcium  in goal. Phos slightly elevated. Continue home meds.   Nutrition - Renal diet w/fluid restrictions.     Lucie Collet, PA-C 04/11/2023, 10:38 AM  [facility] Pager: [phone]

## 2023-04-11 NOTE — TOC Transition Note (Signed)
 Transition of Care [facility]) - Discharge Note   Patient Details  Name: [name] MRN: [id number] Date of Birth: 1965/08/17  [facility] CM/SW Contact:  Hendricks KANDICE Her, RN Phone Number: 04/11/2023, 11:01 AM   Clinical Narrative:     Patient to DC to home today. No TOC needs identified .Patient will follow up as directed on AVS.            Patient Goals and CMS Choice            Discharge Placement                       Discharge Plan and Services Additional resources added to the After Visit Summary for                                       Social Drivers of Health (SDOH) Interventions SDOH Screenings   Food Insecurity: No Food Insecurity (04/09/2023)  Recent Concern: Food Insecurity - Food Insecurity Present (01/22/2023)  Housing: Low Risk  (04/09/2023)  Transportation Needs: No Transportation Needs (04/09/2023)  Utilities: Not At Risk (04/09/2023)  Social Connections: Unknown (08/22/2021)   [facility]  Tobacco Use: Low Risk  (04/08/2023)     Readmission Risk Interventions    01/24/2023    3:01 PM  Readmission Risk Prevention Plan  Transportation Screening Complete  PCP or Specialist Appt within 3-5 Days Complete  HRI or Home Care Consult Complete  Palliative Care Screening Not Applicable  Medication Review (RN Care Manager) Complete

## 2023-04-11 NOTE — Discharge Planning (Signed)
 [facility] Patient Discharge Orders- [facility] CLINIC: [facility]  Patient's name: [name] Admit/DC Dates: 04/08/2023 - 04/11/2023  Discharge Diagnoses: Acute hypoxic respiratory failure   ESRD  Aranesp : Given: yes   Date and amount of last dose: 40mcg on 04/10/23  Last Hgb: 10.0 PRBC's Given: no Date/# of units: n/a ESA dose for discharge: mircera 75 mcg IV q 2 weeks  IV Iron dose at discharge: none  Heparin  change: no  EDW Change: no New EDW:   [place] Change: non  Access intervention/Change: no Details:  Hectorol /Calcitriol  change: no  Discharge Labs: Calcium  9.4 Phosphorus 7.6 Albumin 2.7 K+ 4.5  IV Antibiotics: no Details:  On Coumadin?: no Last INR: Next INR: Managed By:   OTHER/APPTS/LAB ORDERS: Patient is interested in home hemo    D/C Meds to be reconciled by nurse after every discharge.  Completed By: Lucie Collet, PA-C 04/11/2023, 1:42 PM  [facility] Pager: [phone]    Reviewed by: MD:______ RN_______

## 2023-04-12 NOTE — Progress Notes (Signed)
 D/C order noted. Contacted [facility] to be advised of pt's d/c date and that pt should resume care tomorrow.   Olivia Canter Renal Navigator [phone]

## 2023-04-16 ENCOUNTER — Encounter: Payer: 59 | Admitting: Podiatry

## 2023-04-18 NOTE — Progress Notes (Signed)
 Patient was a no-show for his scheduled appointment on 04/16/2023

## 2023-05-23 ENCOUNTER — Ambulatory Visit (HOSPITAL_COMMUNITY)
Admission: RE | Admit: 2023-05-23 | Discharge: 2023-05-23 | Disposition: A | Discharge status: Home / Self Care | Payer: 59 | Attending: Internal Medicine | Admitting: Internal Medicine

## 2023-05-23 ENCOUNTER — Other Ambulatory Visit: Payer: Self-pay

## 2023-05-23 ENCOUNTER — Encounter (HOSPITAL_COMMUNITY)
Admission: RE | Disposition: A | Discharge status: Home / Self Care | Payer: Self-pay | Source: Home / Self Care | Attending: Internal Medicine

## 2023-05-23 ENCOUNTER — Encounter (HOSPITAL_COMMUNITY): Payer: Self-pay | Admitting: Internal Medicine

## 2023-05-23 DIAGNOSIS — I509 Heart failure, unspecified: Secondary | ICD-10-CM | POA: Insufficient documentation

## 2023-05-23 DIAGNOSIS — T82510A Breakdown (mechanical) of surgically created arteriovenous fistula, initial encounter: Secondary | ICD-10-CM | POA: Insufficient documentation

## 2023-05-23 DIAGNOSIS — Z8249 Family history of ischemic heart disease and other diseases of the circulatory system: Secondary | ICD-10-CM | POA: Diagnosis not present

## 2023-05-23 DIAGNOSIS — I132 Hypertensive heart and chronic kidney disease with heart failure and with stage 5 chronic kidney disease, or end stage renal disease: Secondary | ICD-10-CM | POA: Diagnosis not present

## 2023-05-23 DIAGNOSIS — Y832 Surgical operation with anastomosis, bypass or graft as the cause of abnormal reaction of the patient, or of later complication, without mention of misadventure at the time of the procedure: Secondary | ICD-10-CM | POA: Insufficient documentation

## 2023-05-23 DIAGNOSIS — E1122 Type 2 diabetes mellitus with diabetic chronic kidney disease: Secondary | ICD-10-CM | POA: Insufficient documentation

## 2023-05-23 DIAGNOSIS — T82858A Stenosis of vascular prosthetic devices, implants and grafts, initial encounter: Secondary | ICD-10-CM | POA: Insufficient documentation

## 2023-05-23 DIAGNOSIS — Z794 Long term (current) use of insulin: Secondary | ICD-10-CM | POA: Insufficient documentation

## 2023-05-23 DIAGNOSIS — N186 End stage renal disease: Secondary | ICD-10-CM | POA: Insufficient documentation

## 2023-05-23 DIAGNOSIS — Z992 Dependence on renal dialysis: Secondary | ICD-10-CM | POA: Insufficient documentation

## 2023-05-23 HISTORY — PX: A/V FISTULAGRAM: CATH118298

## 2023-05-23 HISTORY — PX: PERIPHERAL VASCULAR BALLOON ANGIOPLASTY: CATH118281

## 2023-05-23 LAB — GLUCOSE, CAPILLARY: Glucose-Capillary: 105 mg/dL — ABNORMAL HIGH (ref 70–99)

## 2023-05-23 SURGERY — A/V FISTULAGRAM
Anesthesia: LOCAL

## 2023-05-23 MED ORDER — LIDOCAINE HCL (PF) 1 % IJ SOLN
INTRAMUSCULAR | Status: AC
  Filled 2023-05-23: qty 30

## 2023-05-23 MED ORDER — HEPARIN (PORCINE) IN NACL 1000-0.9 UT/500ML-% IV SOLN
INTRAVENOUS | Status: DC | PRN
  Administered 2023-05-23: 500 mL

## 2023-05-23 MED ORDER — IODIXANOL 320 MG/ML IV SOLN
INTRAVENOUS | Status: DC | PRN
  Administered 2023-05-23: 8 mL

## 2023-05-23 MED ORDER — LIDOCAINE HCL (PF) 1 % IJ SOLN
INTRAMUSCULAR | Status: DC | PRN
  Administered 2023-05-23: 2 mL via SUBCUTANEOUS

## 2023-05-23 MED ORDER — ACETAMINOPHEN 325 MG PO TABS: 650.0000 mg | ORAL_TABLET | ORAL | Status: DC | PRN

## 2023-05-23 MED ORDER — MIDAZOLAM HCL 2 MG/2ML IJ SOLN
INTRAMUSCULAR | Status: AC
  Filled 2023-05-23: qty 2

## 2023-05-23 MED ORDER — FENTANYL CITRATE (PF) 100 MCG/2ML IJ SOLN
INTRAMUSCULAR | Status: AC
  Filled 2023-05-23: qty 2

## 2023-05-23 MED ORDER — SODIUM CHLORIDE 0.9 % IV SOLN: INTRAVENOUS | Status: DC

## 2023-05-23 MED ORDER — FENTANYL CITRATE (PF) 100 MCG/2ML IJ SOLN
INTRAMUSCULAR | Status: DC | PRN
  Administered 2023-05-23: 25 ug via INTRAVENOUS

## 2023-05-23 MED ORDER — MIDAZOLAM HCL 2 MG/2ML IJ SOLN
INTRAMUSCULAR | Status: DC | PRN
  Administered 2023-05-23: 1 mg via INTRAVENOUS

## 2023-05-23 SURGICAL SUPPLY — 8 items
BALLN MUSTANG 10.0X40 75 (BALLOONS) ×2 IMPLANT
BALLOON MUSTANG 10.0X40 75 (BALLOONS) IMPLANT
COVER DOME SNAP 22 D (MISCELLANEOUS) ×2 IMPLANT
GUIDEWIRE ANGLED .035 180CM (WIRE) IMPLANT
SHEATH PINNACLE R/O II 7F 4CM (SHEATH) IMPLANT
SYR MEDALLION 10ML (SYRINGE) IMPLANT
TRAY PV CATH (CUSTOM PROCEDURE TRAY) ×2 IMPLANT
WIRE MICRO SET SILHO 5FR 7 (SHEATH) IMPLANT

## 2023-05-23 NOTE — Op Note (Signed)
Pt presenting for evaluation of his L BC AVF.  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 He has outflow PTA with 10mm angioplasty in 07/2022. He notes pressure alarms during dialysis and throbbing discomfort in his upper arm during dialysis. AF were 2000 on last 2 checks but clearances suboptimal. He is considering HHD and MD wishes to optimize AV access prior to transition.   Summary:  1)      The patient had successful angioplasty (10 mustang 99% effaced ~ 18 atm) of significant stenosis in the cephalic vein arch involving the confluence and cephalic vein upper arm.  2)      The remainder of the body of the cephalic vein fistula was patent with good flows. 3)      The centrals were widely patent. 4)      This left BCF remains amenable to future percutaneous intervention.  Description of procedure: The arm was prepped and draped in the usual sterile fashion. The left upper arm brachial cephalic fistula was cannulated (96045) with an 21G micropuncture needle directed in an antegrade direction. A guidewire was inserted and exchanged for a 7Fr sheath. Contrast [phone]) injection via the side port of the sheath was performed. The angiogram of the fistula (19147) showed an aneurysmal body of the left BCF, patent outflow cephalic vein with a 70% stenosis in the upper arm  and a 50% cephalic vein arch stenosis involving the confluence. The inflow anastomosis and left centrals were patent.  The wire was advanced centrally without any difficulty. A 10x40 mm Mustang angioplasty balloon was inserted over a glide wire and positioned first at the cephalic vein arch stenosis the cephalic vein stenosis. Venous angioplasty (82956) was carried out to 18 ATM with 99% effacement of the waist on the balloon at both lesions.  Repeat angiogram showed 10% residual at the sites of angioplasty with no evidence of extravasation.  The flow of contrast was quicker and the fistula was  markedly less pulsatile.  Hemostasis: A 3-0 ethilon purse string suture was placed at the cannulation site on removal of the sheath.  Sedation: 1mg  Versed, Fentanyl. Sedation time. 15 minutes  Contrast. 8 mL  Monitoring: Because of the patient's comorbid conditions and sedation during the procedure, continuous EKG monitoring and O2 saturation monitoring was performed throughout the procedure by the RN. There were no abnormal arrhythmias encountered.  Complications: None.   Diagnoses: I87.1 Stricture of vein  N18.6 ESRD T82.858A Stricture of access  Procedure Coding:  [phone] Cannulation and angiogram of fistula, venous angioplasty (cephalic vein + arch) M5784 Contrast  Recommendations:  1. Continue to cannulate the fistula with 15G needles.  2. Refer back for problems with flows. 3. Remove the suture next treatment.   Discharge: The patient was discharged home in stable condition. The patient was given education regarding the care of the dialysis access AVF and specific instructions in case of any problems.

## 2023-05-23 NOTE — Discharge Instructions (Addendum)

## 2023-05-23 NOTE — H&P (Signed)
[facility]  Vascular Access Procedure H&P  Reason for Consultation: low Kt/V Requesting Provider: Dr. Thedore Mins  HPI: [name] is an 58 y.o. male with ESRD on HD, DM, CHF, GERD, HTN, OSA who presents for dialysis access dysfunction.    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  He has outflow PTA with 10mm angioplasty in 07/2022.  He notes pressure alarms during dialysis and throbbing discomfort in his upper arm during dialysis.  AF were 2000 on last 2 checks but clearances suboptimal.  He is considering HHD and MD wishes to optimize AV access prior to transition.   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/28/2014   DM (diabetes mellitus) type II controlled, neurological manifestation (HCC) 12/28/2014   Encephalopathy, hypertensive 12/28/2014   GERD (gastroesophageal reflux disease)    Hypertension    Hypertensive emergency without congestive heart failure 12/27/2014   Pneumonia    Possible Panic disorder 12/28/2014   Renal disorder    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   Sleep apnea    PSH: Past Surgical History:  Procedure Laterality Date   AV FISTULA PLACEMENT Left 01/05/2020   Procedure: LEFT ARM BRACHIOCEPHALIC ARTERIOVENOUS (AV) FISTULA;  Surgeon: Chuck Hint, MD;  Location: [facility] OR;  Service: Vascular;  Laterality: Left;   EMPYEMA DRAINAGE Right 05/17/2017   Procedure: EMPYEMA DRAINAGE AND DECORTICATION;  Surgeon: Delight Ovens, MD;  Location: [facility] OR;  Service: Thoracic;  Laterality: Right;   FLEXIBLE BRONCHOSCOPY  05/17/2017   Procedure: FLEXIBLE BRONCHOSCOPY;   Surgeon: Delight Ovens, MD;  Location: [facility] OR;  Service: Thoracic;;   INSERTION OF DIALYSIS CATHETER Right 12/12/2021   Procedure: INSERTION OF RIGHT INTERNAL JUGULAR DIALYSIS CATHETER;  Surgeon: Chuck Hint, MD;  Location[facility] OR;  Service: Vascular;  Laterality: Right;   IR FLUORO GUIDE CV LINE RIGHT  01/01/2020   IR THORACENTESIS ASP PLEURAL SPACE W/IMG GUIDE  05/17/2017   IR US GUIDE VASC ACCESS RIGHT  01/01/2020   NO PAST SURGERIES     REVISON OF ARTERIOVENOUS FISTULA Left 12/12/2021   Procedure: REVISION OF LEFT ARM FISTULA BY PLICATION;  Surgeon: Chuck Hint, MD;  Location: [facility] OR;  Service: Vascular;  Laterality: Left;   THORACOTOMY/LOBECTOMY Right 05/17/2017   Procedure: RIGHT MINI THORACOTOMY;  Surgeon: Delight Ovens, MD;  Location: [facility] OR;  Service: Thoracic;  Laterality: Right;   VIDEO ASSISTED THORACOSCOPY (VATS)/EMPYEMA Right 05/17/2017   Procedure: RIGHT VIDEO ASSISTED THORACOSCOPY (VATS)/EMPYEMA;  Surgeon: Delight Ovens, MD;  Location: [facility] OR;  Service: Thoracic;  Laterality: Right;     Past Medical History:  Diagnosis Date   Acute kidney injury (HCC) 12/28/2014   Adjustment disorder with mixed anxiety and depressed mood 12/28/2014   Asthma    CHF (congestive heart failure) (HCC)    CPAP (continuous positive airway pressure) dependence    Diabetes mellitus without complication (HCC)    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  Medications Prior to Admission   Medication Sig Dispense Refill   albuterol (PROVENTIL) (2.5 MG/3ML) 0.083% nebulizer solution Take 3 mLs (2.5 mg total) by nebulization every 6 (six) hours as needed for wheezing or shortness of breath. 75 mL 1   albuterol (VENTOLIN HFA) 108 (90 Base) MCG/ACT inhaler Inhale 2 puffs into the lungs every 6 (six) hours as needed for wheezing or shortness of breath. 18 g 1   amLODipine (NORVASC) 10 MG tablet Take 1 tablet (10 mg total) by mouth daily. 30 tablet 0   Budeson-Glycopyrrol-Formoterol (BREZTRI AEROSPHERE) 160-9-4.8 MCG/ACT AERO Inhale 2 puffs into the lungs in the morning and at bedtime.     busPIRone (BUSPAR) 5 MG tablet Take 5 mg by mouth 2 (two) times daily.     calcitRIOL (ROCALTROL) 0.25 MCG capsule Take 1 capsule (0.25 mcg total) by mouth every Monday, Wednesday, and Friday. 12 capsule 0   hydrALAZINE (APRESOLINE) 50 MG tablet Take 1 tablet (50 mg total) by mouth 3 (three) times daily. (Patient taking differently: Take 50 mg by mouth 3 (three) times daily. Patient out of medication 04/08/2023, for about a month.) 90 tablet 0   hydrOXYzine (ATARAX) 25 MG tablet Take 25 mg by mouth daily.     Iron Sucrose (VENOFER IV) Inject 50 mg into the vein See admin instructions. Given on days of Dialysis: Monday, Wednesday, and Friday.     levocetirizine (XYZAL) 5 MG tablet Take 1 tablet (5 mg total) by mouth every evening. 30 tablet 5   lidocaine (XYLOCAINE) 2 % jelly Apply 1 Application topically daily. 50 mL 0   Methoxy PEG-Epoetin Beta (MIRCERA IJ) Inject 1 Dose into the skin every 14 (fourteen) days.     multivitamin (RENA-VIT) TABS tablet Take 1 tablet by mouth at bedtime. 30 tablet 0   polyethylene glycol powder (GLYCOLAX/MIRALAX) 17 GM/SCOOP powder Mix as directed and take 1 capful (17 g) by mouth daily. 238 g 0   sevelamer carbonate (RENVELA) 800 MG tablet Take 3 tablets (2,400 mg total) by mouth 3 (three) times daily with meals. 270 tablet 0   traMADol (ULTRAM) 50 MG tablet Take 1 tablet  (50 mg total) by mouth every 12 (twelve) hours as needed for moderate pain (pain score 4-6). 14 tablet 0   Vitamin D, Ergocalciferol, (DRISDOL) 1.25 MG (50000 UNIT) CAPS capsule Take 50,000 Units by mouth See admin instructions. Take one tablet by mouth on Monday, Wednesdays and Fridays per patient     XPHOZAH 30 MG TABS Take 1 tablet by mouth 2 (two) times daily.     Accu-Chek Softclix Lancets lancets Use as directed.  For insulin-dependent type 2 diabetes Monitor  glucose 3 times a day (Patient taking differently: 1 each by Other route See admin instructions. Use as directed.  For insulin-dependent type 2 diabetes Monitor  glucose 3 times a day) 100 each 0   camphor-menthol (SARNA) lotion Apply topically 2 (two) times daily. (Patient taking differently: Apply 1 Application topically 2 (two) times daily.) 222 mL 0   carvedilol (COREG) 25 MG tablet Take 1 tablet (25 mg total) by mouth 2 (two) times daily with a meal. 60 tablet 0   gabapentin (NEURONTIN) 300 MG capsule Take 1 capsule (300 mg total)  by mouth daily. 14 capsule 0   glucose blood test strip Use as instructed For insulin-dependent type 2 diabetes Monitor  glucose 3 times a day (Patient taking differently: 1 each by Other route See admin instructions. Use as instructed For insulin-dependent type 2 diabetes Monitor  glucose 3 times a day) 100 each 0    ALLERGIES:   Allergies  Allergen Reactions   Oxycodone Itching    FAM HX: Family History  Problem Relation Age of Onset   Hypertension Mother    Lung cancer Father     Social History:   reports that he has never smoked. He has never been exposed to tobacco smoke. He has never used smokeless tobacco. He reports that he does not drink alcohol and does not use drugs.  ROS: 12 system ROS neg except per HPI above  Blood pressure (!) 159/96, pulse 75, temperature 97.9 F (36.6 C), temperature source Oral, resp. rate 16, SpO2 100%. PHYSICAL EXAM: Gen: well appearing  Eyes:  anciteric ZOX:WRUEA 3 airway CV: RRR Lungs: clear Extr:  LUE AVF megafistula, +t/b, noncollapsing with elevation of arm Neuro: nonfocal Skin: skin overlying AVF is normal   Results for orders placed or performed during the hospital encounter of 05/23/23 (from the past 48 hours)  Glucose, capillary     Status: Abnormal   Collection Time: 05/23/23  7:06 AM  Result Value Ref Range   Glucose-Capillary 105 (H) 70 - 99 mg/dL    Comment: Glucose reference range applies only to samples taken after fasting for at least 8 hours.    No results found.  Assessment/PlanReginald Crays is an 58 y.o. male with ESRD on HD, DM, CHF, GERD, HTN, OSA who presents for dialysis access dysfunction.    **ESRD with dialysis access dysfunction:  AVF with pressure alarms, pain and low clearances.  Exam suggests outflow stenosis.  Pt consented for angiogram with angioplasty if stenosis identified.  Reviewed risk, benefit, alternatives and he wishes to proceed.  NPO and has driver, will give IV sedation with appropriate monitoring.   Tyler Pita 05/23/2023, 7:16 AM

## 2023-06-10 ENCOUNTER — Other Ambulatory Visit: Payer: Self-pay

## 2023-06-10 ENCOUNTER — Emergency Department (HOSPITAL_COMMUNITY)

## 2023-06-10 ENCOUNTER — Encounter (HOSPITAL_COMMUNITY): Payer: Self-pay | Admitting: Emergency Medicine

## 2023-06-10 ENCOUNTER — Inpatient Hospital Stay (HOSPITAL_COMMUNITY)
Admission: EM | Admit: 2023-06-10 | Discharge: 2023-06-13 | DRG: 640 | Disposition: A | Discharge status: Home Health | Attending: Family Medicine | Admitting: Family Medicine

## 2023-06-10 DIAGNOSIS — Z885 Allergy status to narcotic agent status: Secondary | ICD-10-CM | POA: Diagnosis not present

## 2023-06-10 DIAGNOSIS — Z91158 Patient's noncompliance with renal dialysis for other reason: Secondary | ICD-10-CM | POA: Diagnosis not present

## 2023-06-10 DIAGNOSIS — Z8249 Family history of ischemic heart disease and other diseases of the circulatory system: Secondary | ICD-10-CM | POA: Diagnosis not present

## 2023-06-10 DIAGNOSIS — Z7951 Long term (current) use of inhaled steroids: Secondary | ICD-10-CM | POA: Diagnosis not present

## 2023-06-10 DIAGNOSIS — Z992 Dependence on renal dialysis: Secondary | ICD-10-CM

## 2023-06-10 DIAGNOSIS — Z6834 Body mass index (BMI) 34.0-34.9, adult: Secondary | ICD-10-CM

## 2023-06-10 DIAGNOSIS — N186 End stage renal disease: Secondary | ICD-10-CM | POA: Diagnosis present

## 2023-06-10 DIAGNOSIS — E119 Type 2 diabetes mellitus without complications: Secondary | ICD-10-CM

## 2023-06-10 DIAGNOSIS — I5032 Chronic diastolic (congestive) heart failure: Secondary | ICD-10-CM | POA: Diagnosis present

## 2023-06-10 DIAGNOSIS — E1122 Type 2 diabetes mellitus with diabetic chronic kidney disease: Secondary | ICD-10-CM | POA: Diagnosis present

## 2023-06-10 DIAGNOSIS — I480 Paroxysmal atrial fibrillation: Secondary | ICD-10-CM | POA: Diagnosis present

## 2023-06-10 DIAGNOSIS — Z801 Family history of malignant neoplasm of trachea, bronchus and lung: Secondary | ICD-10-CM | POA: Diagnosis not present

## 2023-06-10 DIAGNOSIS — N2581 Secondary hyperparathyroidism of renal origin: Secondary | ICD-10-CM | POA: Diagnosis present

## 2023-06-10 DIAGNOSIS — J9601 Acute respiratory failure with hypoxia: Secondary | ICD-10-CM | POA: Diagnosis present

## 2023-06-10 DIAGNOSIS — D631 Anemia in chronic kidney disease: Secondary | ICD-10-CM | POA: Diagnosis present

## 2023-06-10 DIAGNOSIS — Z79899 Other long term (current) drug therapy: Secondary | ICD-10-CM

## 2023-06-10 DIAGNOSIS — K219 Gastro-esophageal reflux disease without esophagitis: Secondary | ICD-10-CM | POA: Diagnosis present

## 2023-06-10 DIAGNOSIS — I10 Essential (primary) hypertension: Secondary | ICD-10-CM | POA: Diagnosis not present

## 2023-06-10 DIAGNOSIS — R051 Acute cough: Secondary | ICD-10-CM | POA: Diagnosis not present

## 2023-06-10 DIAGNOSIS — J189 Pneumonia, unspecified organism: Secondary | ICD-10-CM | POA: Diagnosis present

## 2023-06-10 DIAGNOSIS — Z1152 Encounter for screening for COVID-19: Secondary | ICD-10-CM | POA: Diagnosis not present

## 2023-06-10 DIAGNOSIS — R0609 Other forms of dyspnea: Secondary | ICD-10-CM | POA: Diagnosis not present

## 2023-06-10 DIAGNOSIS — J4540 Moderate persistent asthma, uncomplicated: Secondary | ICD-10-CM | POA: Diagnosis present

## 2023-06-10 DIAGNOSIS — E1140 Type 2 diabetes mellitus with diabetic neuropathy, unspecified: Secondary | ICD-10-CM | POA: Diagnosis present

## 2023-06-10 DIAGNOSIS — J440 Chronic obstructive pulmonary disease with acute lower respiratory infection: Secondary | ICD-10-CM | POA: Diagnosis present

## 2023-06-10 DIAGNOSIS — Z794 Long term (current) use of insulin: Secondary | ICD-10-CM | POA: Diagnosis not present

## 2023-06-10 DIAGNOSIS — I132 Hypertensive heart and chronic kidney disease with heart failure and with stage 5 chronic kidney disease, or end stage renal disease: Secondary | ICD-10-CM | POA: Diagnosis present

## 2023-06-10 DIAGNOSIS — R0602 Shortness of breath: Secondary | ICD-10-CM | POA: Diagnosis present

## 2023-06-10 DIAGNOSIS — E8770 Fluid overload, unspecified: Principal | ICD-10-CM | POA: Diagnosis present

## 2023-06-10 HISTORY — DX: Pleural effusion, not elsewhere classified: J90

## 2023-06-10 HISTORY — DX: End stage renal disease: N18.6

## 2023-06-10 HISTORY — DX: Pyothorax without fistula: J86.9

## 2023-06-10 HISTORY — DX: End stage renal disease: Z99.2

## 2023-06-10 LAB — CBC WITH DIFFERENTIAL/PLATELET
Abs Immature Granulocytes: 0.07 10*3/uL (ref 0.00–0.07)
Basophils Absolute: 0 10*3/uL (ref 0.0–0.1)
Basophils Relative: 0 %
Eosinophils Absolute: 0.1 10*3/uL (ref 0.0–0.5)
Eosinophils Relative: 1 %
HCT: 33.5 % — ABNORMAL LOW (ref 39.0–52.0)
Hemoglobin: 11 g/dL — ABNORMAL LOW (ref 13.0–17.0)
Immature Granulocytes: 1 %
Lymphocytes Relative: 13 %
Lymphs Abs: 1.4 10*3/uL (ref 0.7–4.0)
MCH: 31.9 pg (ref 26.0–34.0)
MCHC: 32.8 g/dL (ref 30.0–36.0)
MCV: 97.1 fL (ref 80.0–100.0)
Monocytes Absolute: 0.5 10*3/uL (ref 0.1–1.0)
Monocytes Relative: 5 %
Neutro Abs: 8.8 10*3/uL — ABNORMAL HIGH (ref 1.7–7.7)
Neutrophils Relative %: 80 %
Platelets: 381 10*3/uL (ref 150–400)
RBC: 3.45 MIL/uL — ABNORMAL LOW (ref 4.22–5.81)
RDW: 14.1 % (ref 11.5–15.5)
WBC: 11 10*3/uL — ABNORMAL HIGH (ref 4.0–10.5)
nRBC: 0 % (ref 0.0–0.2)

## 2023-06-10 LAB — I-STAT VENOUS BLOOD GAS, ED
Acid-Base Excess: 3 mmol/L — ABNORMAL HIGH (ref 0.0–2.0)
Bicarbonate: 28.3 mmol/L — ABNORMAL HIGH (ref 20.0–28.0)
Calcium, Ion: 1.19 mmol/L (ref 1.15–1.40)
HCT: 35 % — ABNORMAL LOW (ref 39.0–52.0)
Hemoglobin: 11.9 g/dL — ABNORMAL LOW (ref 13.0–17.0)
O2 Saturation: 66 %
Potassium: 4.9 mmol/L (ref 3.5–5.1)
Sodium: 135 mmol/L (ref 135–145)
TCO2: 30 mmol/L (ref 22–32)
pCO2, Ven: 46.1 mmHg (ref 44–60)
pH, Ven: 7.397 (ref 7.25–7.43)
pO2, Ven: 35 mmHg (ref 32–45)

## 2023-06-10 LAB — COMPREHENSIVE METABOLIC PANEL
ALT: 10 U/L (ref 0–44)
AST: 15 U/L (ref 15–41)
Albumin: 3.4 g/dL — ABNORMAL LOW (ref 3.5–5.0)
Alkaline Phosphatase: 64 U/L (ref 38–126)
Anion gap: 18 — ABNORMAL HIGH (ref 5–15)
BUN: 60 mg/dL — ABNORMAL HIGH (ref 6–20)
CO2: 25 mmol/L (ref 22–32)
Calcium: 10.2 mg/dL (ref 8.9–10.3)
Chloride: 92 mmol/L — ABNORMAL LOW (ref 98–111)
Creatinine, Ser: 12.3 mg/dL — ABNORMAL HIGH (ref 0.61–1.24)
GFR, Estimated: 4 mL/min — ABNORMAL LOW (ref >60)
Glucose, Bld: 138 mg/dL — ABNORMAL HIGH (ref 70–99)
Potassium: 4.9 mmol/L (ref 3.5–5.1)
Sodium: 135 mmol/L (ref 135–145)
Total Bilirubin: 0.6 mg/dL (ref 0.0–1.2)
Total Protein: 7.3 g/dL (ref 6.5–8.1)

## 2023-06-10 LAB — I-STAT CHEM 8, ED
BUN: 55 mg/dL — ABNORMAL HIGH (ref 6–20)
Calcium, Ion: 1.15 mmol/L (ref 1.15–1.40)
Chloride: 99 mmol/L (ref 98–111)
Creatinine, Ser: 13.4 mg/dL — ABNORMAL HIGH (ref 0.61–1.24)
Glucose, Bld: 132 mg/dL — ABNORMAL HIGH (ref 70–99)
HCT: 35 % — ABNORMAL LOW (ref 39.0–52.0)
Hemoglobin: 11.9 g/dL — ABNORMAL LOW (ref 13.0–17.0)
Potassium: 4.9 mmol/L (ref 3.5–5.1)
Sodium: 134 mmol/L — ABNORMAL LOW (ref 135–145)
TCO2: 27 mmol/L (ref 22–32)

## 2023-06-10 LAB — RESPIRATORY PANEL BY PCR

## 2023-06-10 LAB — RESP PANEL BY RT-PCR (RSV, FLU A&B, COVID)  RVPGX2
Influenza A by PCR: NEGATIVE
Influenza B by PCR: NEGATIVE
Resp Syncytial Virus by PCR: NEGATIVE
SARS Coronavirus 2 by RT PCR: NEGATIVE

## 2023-06-10 LAB — PROCALCITONIN: Procalcitonin: 0.42 ng/mL

## 2023-06-10 LAB — HEPATITIS B SURFACE ANTIGEN: Hepatitis B Surface Ag: NONREACTIVE

## 2023-06-10 MED ORDER — HEPARIN SODIUM (PORCINE) 5000 UNIT/ML IJ SOLN
5000.0000 [IU] | Freq: Three times a day (TID) | INTRAMUSCULAR | Status: DC
  Administered 2023-06-10 – 2023-06-13 (×8): 5000 [IU] via SUBCUTANEOUS
  Filled 2023-06-10 (×9): qty 1

## 2023-06-10 MED ORDER — ACETAMINOPHEN 650 MG RE SUPP: 650.0000 mg | Freq: Four times a day (QID) | RECTAL | Status: DC | PRN

## 2023-06-10 MED ORDER — CHLORHEXIDINE GLUCONATE CLOTH 2 % EX PADS
6.0000 | MEDICATED_PAD | Freq: Every day | CUTANEOUS | Status: DC
  Administered 2023-06-11 – 2023-06-13 (×3): 6 via TOPICAL

## 2023-06-10 MED ORDER — ACETAMINOPHEN 325 MG PO TABS
650.0000 mg | ORAL_TABLET | Freq: Four times a day (QID) | ORAL | Status: DC | PRN
  Administered 2023-06-10: 650 mg via ORAL
  Filled 2023-06-10: qty 2

## 2023-06-10 MED ORDER — SODIUM CHLORIDE 0.9 % IV SOLN
2.0000 g | INTRAVENOUS | Status: DC
  Administered 2023-06-10 – 2023-06-11 (×2): 2 g via INTRAVENOUS
  Filled 2023-06-10 (×3): qty 20

## 2023-06-10 MED ORDER — AMLODIPINE BESYLATE 10 MG PO TABS
10.0000 mg | ORAL_TABLET | Freq: Every day | ORAL | Status: DC
  Administered 2023-06-10 – 2023-06-13 (×3): 10 mg via ORAL
  Filled 2023-06-10 (×3): qty 1

## 2023-06-10 MED ORDER — HEPARIN SODIUM (PORCINE) 1000 UNIT/ML IJ SOLN
5000.0000 [IU] | INTRAMUSCULAR | Status: DC | PRN
  Administered 2023-06-11 – 2023-06-12 (×2): 5000 [IU] via INTRAVENOUS
  Filled 2023-06-10 (×2): qty 5

## 2023-06-10 MED ORDER — FUROSEMIDE 10 MG/ML IJ SOLN
80.0000 mg | Freq: Once | INTRAMUSCULAR | Status: AC
  Administered 2023-06-10: 80 mg via INTRAVENOUS
  Filled 2023-06-10: qty 8

## 2023-06-10 MED ORDER — HEPARIN SODIUM (PORCINE) 1000 UNIT/ML IJ SOLN
2000.0000 [IU] | INTRAMUSCULAR | Status: DC | PRN
  Administered 2023-06-11 – 2023-06-12 (×2): 2000 [IU] via INTRAVENOUS

## 2023-06-10 MED ORDER — HYDRALAZINE HCL 50 MG PO TABS
50.0000 mg | ORAL_TABLET | Freq: Three times a day (TID) | ORAL | Status: DC
  Administered 2023-06-10 – 2023-06-13 (×7): 50 mg via ORAL
  Filled 2023-06-10 (×7): qty 1

## 2023-06-10 MED ORDER — SODIUM CHLORIDE 0.9 % IV SOLN
500.0000 mg | INTRAVENOUS | Status: DC
  Administered 2023-06-10: 500 mg via INTRAVENOUS
  Filled 2023-06-10: qty 5

## 2023-06-10 MED ORDER — PROMETHAZINE-PHENYLEPHRINE 6.25-5 MG/5ML PO SYRP: 5.0000 mL | ORAL_SOLUTION | ORAL | Status: DC | PRN

## 2023-06-10 MED ORDER — PROMETHAZINE-PHENYLEPHRINE 6.25-5 MG/5ML PO SYRP
5.0000 mL | ORAL_SOLUTION | ORAL | Status: DC
  Filled 2023-06-10 (×3): qty 5

## 2023-06-10 MED ORDER — HYDROCODONE BIT-HOMATROP MBR 5-1.5 MG/5ML PO SOLN
5.0000 mL | Freq: Four times a day (QID) | ORAL | Status: DC
  Filled 2023-06-10: qty 5

## 2023-06-10 MED ORDER — PROMETHAZINE HCL 6.25 MG/5ML PO SOLN
12.5000 mg | Freq: Four times a day (QID) | ORAL | Status: DC
  Administered 2023-06-10 – 2023-06-13 (×9): 12.5 mg via ORAL
  Filled 2023-06-10 (×13): qty 10

## 2023-06-10 MED ORDER — REVEFENACIN 175 MCG/3ML IN SOLN
175.0000 ug | Freq: Every day | RESPIRATORY_TRACT | Status: DC
  Administered 2023-06-11 – 2023-06-13 (×2): 175 ug via RESPIRATORY_TRACT
  Filled 2023-06-10 (×2): qty 3

## 2023-06-10 MED ORDER — SEVELAMER CARBONATE 800 MG PO TABS
2400.0000 mg | ORAL_TABLET | Freq: Three times a day (TID) | ORAL | Status: DC
  Administered 2023-06-11 – 2023-06-13 (×5): 2400 mg via ORAL
  Filled 2023-06-10 (×6): qty 3

## 2023-06-10 MED ORDER — VITAMIN D (ERGOCALCIFEROL) 1.25 MG (50000 UNIT) PO CAPS
50000.0000 [IU] | ORAL_CAPSULE | ORAL | Status: DC
  Administered 2023-06-10 – 2023-06-12 (×2): 50000 [IU] via ORAL
  Filled 2023-06-10 (×2): qty 1

## 2023-06-10 MED ORDER — CARVEDILOL 25 MG PO TABS
25.0000 mg | ORAL_TABLET | Freq: Two times a day (BID) | ORAL | Status: DC
  Administered 2023-06-10 – 2023-06-13 (×5): 25 mg via ORAL
  Filled 2023-06-10 (×5): qty 1

## 2023-06-10 MED ORDER — RENA-VITE PO TABS
1.0000 | ORAL_TABLET | Freq: Every day | ORAL | Status: DC
  Administered 2023-06-10 – 2023-06-12 (×3): 1 via ORAL
  Filled 2023-06-10 (×3): qty 1

## 2023-06-10 MED ORDER — ARFORMOTEROL TARTRATE 15 MCG/2ML IN NEBU
15.0000 ug | INHALATION_SOLUTION | Freq: Two times a day (BID) | RESPIRATORY_TRACT | Status: DC
  Administered 2023-06-10 – 2023-06-13 (×5): 15 ug via RESPIRATORY_TRACT
  Filled 2023-06-10 (×6): qty 2

## 2023-06-10 MED ORDER — BUDESONIDE 0.25 MG/2ML IN SUSP
0.2500 mg | Freq: Two times a day (BID) | RESPIRATORY_TRACT | Status: DC
  Administered 2023-06-10 – 2023-06-12 (×4): 0.25 mg via RESPIRATORY_TRACT
  Filled 2023-06-10 (×5): qty 2

## 2023-06-10 NOTE — Plan of Care (Signed)
 [facility]MT[facility] Brief Progress Note  S:Evaluated pt at bedside with Dr. Velna Ochs. States he has had intermittent left sided chest pain for "a while", states it started before dialysis Friday. Reports it only occurs when he is coughing. Also reports shortness of breath that is worse when he lays on his side and cough.    O: BP (!) 157/90 (BP Location: Right Arm)   Pulse 82   Temp 98 F (36.7 C) (Oral)   Resp 18   Ht 5\' 7"  (1.702 m)   Wt 100.2 kg   SpO2 97%   BMI 34.61 kg/m   Gen: no acute distress, resting comfortably, on facetime CV: RRR, no m/r/g Pulm: Breathing comfortably on 2LNC. Coarse breath sounds, R>L. Speaking in complete sentences. Occasional cough.  Ext: no swelling BLE  A/P: Acute respiratory failure with hypoxia - Pulmonology and nephrology consulted and following - Pt has hydrocodone allergy, discontinued hycodan cough syrup and scheduled phenergan cough syrup - Nebulizers - Brovana, Pulmicort, Yupelri - Antibiotics to cover for suspected RLL pneumonia - Azithromycin, Ceftriaxone - wean O2 as able - potentially iHD tonight - pt advised to let us know if he feels worse in any way - rest of plan per day team  - Orders reviewed. Labs for AM ordered, which was adjusted as needed.   Para March, DO 06/10/2023, 8:35 PM PGY-1, [facility] Night Resident  Please page [phone] with questions.

## 2023-06-10 NOTE — ED Provider Notes (Signed)
 [facility] Provider Note  CSN: [id number] Arrival date & time: 06/10/23 1052  Chief Complaint(s) Shortness of Breath  HPI [name] is a 58 y.o. male history of CHF, diabetes, hypertension, end-stage renal disease on dialysis presenting with shortness of breath.  Patient reports worsening shortness of breath since yesterday, felt so bad that he did not go to dialysis this morning.  Reports chronic cough which is unchanged.  Denies any fevers or chills.  No abdominal pain.  No chest pain.  Reports shortness of breath worse when lying flat.   Past Medical History Past Medical History:  Diagnosis Date   Acute kidney injury (HCC) 12/28/2014   Adjustment disorder with mixed anxiety and depressed mood 12/28/2014   Asthma    Bacteremia due to Pseudomonas 12/01/2022   CHF (congestive heart failure) (HCC)    CPAP (continuous positive airway pressure) dependence    Diabetes mellitus without complication (HCC)    DM (diabetes mellitus) type II controlled with renal manifestation (HCC) 12/28/2014   DM (diabetes mellitus) type II controlled, neurological manifestation (HCC) 12/28/2014   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HCC)    Encephalopathy, hypertensive 12/28/2014   GERD (gastroesophageal reflux disease)    Hypertension    Hypertensive emergency without congestive heart failure 12/27/2014   Pleural effusion    Pneumonia    Possible Panic disorder 12/28/2014   Renal disorder    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   Sleep apnea    Patient Active Problem List   Diagnosis Date Noted   Anemia in chronic kidney disease (CKD) 04/09/2023   Prolonged QT interval 12/02/2022   Leg wound, left 11/30/2022   Acute hypoxic respiratory failure (HCC) 05/22/2022   Chronic heart failure with preserved ejection fraction (HCC) 05/22/2022   Tinea pedis of both feet 11/22/2020   Cardiomegaly 02/02/2020   Chronic ulcer of right leg (HCC) 10/25/2019   AF (paroxysmal atrial fibrillation) (HCC) 10/25/2019    Bilateral lower extremity edema 10/06/2019   Eustachian tube dysfunction, right 10/06/2019   Chronic anticoagulation 12/11/2018   Myocardial injury 11/02/2018   Diabetic polyneuropathy associated with type 2 diabetes mellitus (HCC) 11/13/2017   ESRD (end stage renal disease) (HCC) 07/28/2017   Microalbuminuria 01/20/2015   DMII (diabetes mellitus, type 2) (HCC) 12/28/2014   Recurrent major depressive disorder, in partial remission (HCC) 12/28/2014   Anxiety 12/23/2014   Class 2 obesity 05/14/2012   Sleep apnea 05/14/2012   Class 2 severe obesity due to excess calories with serious comorbidity and body mass index (BMI) of 38.0 to 38.9 in adult [facility]) 05/14/2012   Severe uncontrolled hypertension 03/28/2011   Home Medication(s) Prior to Admission medications   Medication Sig Start Date End Date Taking? Authorizing Provider  albuterol (PROVENTIL) (2.5 MG/3ML) 0.083% nebulizer solution Take 3 mLs (2.5 mg total) by nebulization every 6 (six) hours as needed for wheezing or shortness of breath. 09/25/22  Yes Alfonse Spruce, MD  albuterol (VENTOLIN HFA) 108 (90 Base) MCG/ACT inhaler Inhale 2 puffs into the lungs every 6 (six) hours as needed for wheezing or shortness of breath. 09/25/22  Yes Alfonse Spruce, MD  amLODipine (NORVASC) 10 MG tablet Take 1 tablet (10 mg total) by mouth daily. 05/24/22  Yes Rocky Morel, DO  benzonatate (TESSALON) 100 MG capsule Take 200 mg by mouth 3 (three) times daily as needed for cough. 04/18/23  Yes [provider]  Budeson-Glycopyrrol-Formoterol (BREZTRI AEROSPHERE) 160-9-4.8 MCG/ACT AERO Inhale 2 puffs into the lungs in the morning and at bedtime. 10/29/22  Yes [provider]  busPIRone (BUSPAR) 5 MG tablet Take 5 mg by mouth 2 (two) times daily. 02/05/23  Yes [provider]  calcitRIOL (ROCALTROL) 0.25 MCG capsule Take 1 capsule (0.25 mcg total) by mouth every Monday, Wednesday, and Friday. 01/08/20  Yes Albertine Grates, MD   camphor-menthol [facility]) lotion Apply topically 2 (two) times daily. Patient taking differently: Apply 1 Application topically 2 (two) times daily. 12/03/22  Yes Lockie Mola, MD  carvedilol (COREG) 25 MG tablet Take 1 tablet (25 mg total) by mouth 2 (two) times daily with a meal. 05/24/22  Yes Rocky Morel, DO  hydrOXYzine (ATARAX) 25 MG tablet Take 25 mg by mouth daily. 02/08/23  Yes [provider]  levocetirizine (XYZAL) 5 MG tablet Take 1 tablet (5 mg total) by mouth every evening. 09/25/22  Yes Alfonse Spruce, MD  lidocaine (XYLOCAINE) 2 % jelly Apply 1 Application topically daily. 11/26/22  Yes Alvira Monday, MD  multivitamin (RENA-VIT) TABS tablet Take 1 tablet by mouth at bedtime. 05/24/22  Yes Rocky Morel, DO  ondansetron (ZOFRAN-ODT) 4 MG disintegrating tablet Take 4 mg by mouth daily as needed for vomiting or nausea. 04/18/23  Yes [provider]  polyethylene glycol powder (GLYCOLAX/MIRALAX) 17 GM/SCOOP powder Mix as directed and take 1 capful (17 g) by mouth daily. Patient taking differently: Take 17 g by mouth daily as needed for mild constipation. 02/20/23  Yes Morene Crocker, MD  sevelamer carbonate (RENVELA) 800 MG tablet Take 3 tablets (2,400 mg total) by mouth 3 (three) times daily with meals. 12/03/22  Yes Lockie Mola, MD  traMADol (ULTRAM) 50 MG tablet Take 1 tablet (50 mg total) by mouth every 12 (twelve) hours as needed for moderate pain (pain score 4-6). 01/24/23  Yes Peterson Ao, MD  Vitamin D, Ergocalciferol, (DRISDOL) 1.25 MG (50000 UNIT) CAPS capsule Take 50,000 Units by mouth See admin instructions. Take one tablet by mouth on Monday, Wednesdays and Fridays per patient 08/17/19  Yes [provider]  XPHOZAH 30 MG TABS Take 1 tablet by mouth 2 (two) times daily. 01/15/23  Yes [provider]  Accu-Chek Softclix Lancets lancets Use as directed.  For insulin-dependent type 2 diabetes Monitor  glucose 3 times a  day Patient taking differently: 1 each by Other route See admin instructions. Use as directed.  For insulin-dependent type 2 diabetes Monitor  glucose 3 times a day 01/06/20   Albertine Grates, MD  gabapentin (NEURONTIN) 300 MG capsule Take 1 capsule (300 mg total) by mouth daily. Patient not taking: Reported on 06/10/2023 01/25/23   Peterson Ao, MD  glucose blood test strip Use as instructed For insulin-dependent type 2 diabetes Monitor  glucose 3 times a day Patient taking differently: 1 each by Other route See admin instructions. Use as instructed For insulin-dependent type 2 diabetes Monitor  glucose 3 times a day 01/06/20   Albertine Grates, MD  hydrALAZINE (APRESOLINE) 50 MG tablet Take 1 tablet (50 mg total) by mouth 3 (three) times daily. Patient taking differently: Take 50 mg by mouth 3 (three) times daily. Patient out of medication 04/08/2023, for about a month. 02/19/23 05/23/23  Morene Crocker, MD  Iron Sucrose (VENOFER IV) Inject 50 mg into the vein See admin instructions. Given on days of Dialysis: Monday, Wednesday, and Friday. 03/08/23 03/06/24  [provider]  insulin aspart protamine - aspart (NOVOLOG MIX 70/30 FLEXPEN) (70-30) 100 UNIT/ML FlexPen Inject 15-30 Units into the skin 2 (two) times daily.  05/24/22  [provider]  Past Surgical History Past Surgical History:  Procedure Laterality Date   A/V FISTULAGRAM N/A 05/23/2023   Procedure: A/V Fistulagram;  Surgeon: Tyler Pita, MD;  [facility] INVASIVE CV LAB;  Service: Cardiovascular;  Laterality: N/A;   AV FISTULA PLACEMENT Left 01/05/2020   Procedure: LEFT ARM BRACHIOCEPHALIC ARTERIOVENOUS (AV) FISTULA;  Surgeon: Chuck Hint, MD;  Location: [facility] OR;  Service: Vascular;  Laterality: Left;   EMPYEMA DRAINAGE Right 05/17/2017   Procedure: EMPYEMA DRAINAGE AND  DECORTICATION;  Surgeon: Delight Ovens, MD;  Location: [facility] OR;  Service: Thoracic;  Laterality: Right;   FLEXIBLE BRONCHOSCOPY  05/17/2017   Procedure: FLEXIBLE BRONCHOSCOPY;  Surgeon: Delight Ovens, MD;  Location: [facility] OR;  Service: Thoracic;;   INSERTION OF DIALYSIS CATHETER Right 12/12/2021   Procedure: INSERTION OF RIGHT INTERNAL JUGULAR DIALYSIS CATHETER;  Surgeon: Chuck Hint, MD;  Location: [facility] OR;  Service: Vascular;  Laterality: Right;   IR FLUORO GUIDE CV LINE RIGHT  01/01/2020   IR THORACENTESIS ASP PLEURAL SPACE W/IMG GUIDE  05/17/2017   IR US GUIDE VASC ACCESS RIGHT  01/01/2020   NO PAST SURGERIES     PERIPHERAL VASCULAR BALLOON ANGIOPLASTY  05/23/2023   Procedure: PERIPHERAL VASCULAR BALLOON ANGIOPLASTY;  Surgeon: Tyler Pita, MD;  Location: [facility] INVASIVE CV LAB;  Service: Cardiovascular;;   REVISON OF ARTERIOVENOUS FISTULA Left 12/12/2021   Procedure: REVISION OF LEFT ARM FISTULA BY PLICATION;  Surgeon: Chuck Hint, MD;  Location: [facility] OR;  Service: Vascular;  Laterality: Left;   THORACOTOMY/LOBECTOMY Right 05/17/2017   Procedure: RIGHT MINI THORACOTOMY;  Surgeon: Delight Ovens, MD;  Location: [facility] OR;  Service: Thoracic;  Laterality: Right;   VIDEO ASSISTED THORACOSCOPY (VATS)/EMPYEMA Right 05/17/2017   Procedure: RIGHT VIDEO ASSISTED THORACOSCOPY (VATS)/EMPYEMA;  Surgeon: Delight Ovens, MD;  Location: [facility] OR;  Service: Thoracic;  Laterality: Right;   Family History Family History  Problem Relation Age of Onset   Hypertension Mother    Lung cancer Father     Social History Social History   Tobacco Use   Smoking status: Never    Passive exposure: Never   Smokeless tobacco: Never  Vaping Use   Vaping status: Never Used  Substance Use Topics   Alcohol use: No   Drug use: No   Allergies Oxycodone  Review of Systems Review of Systems  All other systems reviewed and are negative.   Physical Exam Vital Signs  I have reviewed the triage  vital signs BP (!) 214/115 (BP Location: Right Arm)   Pulse 91   Temp 98 F (36.7 C) (Oral)   Resp 16   Ht 5\' 7"  (1.702 m)   Wt 100.2 kg   SpO2 97%   BMI 34.61 kg/m  Physical Exam Vitals and nursing note reviewed.  Constitutional:      General: He is not in acute distress.    Appearance: Normal appearance.  HENT:     Mouth/Throat:     Mouth: Mucous membranes are moist.  Eyes:     Conjunctiva/sclera: Conjunctivae normal.  Neck:     Vascular: JVD present.  Cardiovascular:     Rate and Rhythm: Normal rate and regular rhythm.  Pulmonary:     Effort: Tachypnea and respiratory distress present.     Breath sounds: Examination of the right-middle field reveals rales. Examination of the left-middle field reveals rales. Examination of the right-lower field reveals rales. Examination of the left-lower field reveals rales. Rales present.  Abdominal:  General: Abdomen is flat.     Palpations: Abdomen is soft.     Tenderness: There is no abdominal tenderness.  Musculoskeletal:     Right lower leg: Edema present.     Left lower leg: Edema present.  Skin:    General: Skin is warm and dry.     Capillary Refill: Capillary refill takes less than 2 seconds.  Neurological:     Mental Status: He is alert and oriented to person, place, and time. Mental status is at baseline.  Psychiatric:        Mood and Affect: Mood normal.        Behavior: Behavior normal.     ED Results and Treatments Labs (all labs ordered are listed, but only abnormal results are displayed) Labs Reviewed  COMPREHENSIVE METABOLIC PANEL - Abnormal; Notable for the following components:      Result Value   Chloride 92 (*)    Glucose, Bld 138 (*)    BUN 60 (*)    Creatinine, Ser 12.30 (*)    Albumin 3.4 (*)    GFR, Estimated 4 (*)    Anion gap 18 (*)    All other components within normal limits  CBC WITH DIFFERENTIAL/PLATELET - Abnormal; Notable for the following components:   WBC 11.0 (*)    RBC 3.45 (*)     Hemoglobin 11.0 (*)    HCT 33.5 (*)    Neutro Abs 8.8 (*)    All other components within normal limits  I-STAT CHEM 8, ED - Abnormal; Notable for the following components:   Sodium 134 (*)    BUN 55 (*)    Creatinine, Ser 13.40 (*)    Glucose, Bld 132 (*)    Hemoglobin 11.9 (*)    HCT 35.0 (*)    All other components within normal limits  I-STAT VENOUS BLOOD GAS, ED - Abnormal; Notable for the following components:   Bicarbonate 28.3 (*)    Acid-Base Excess 3.0 (*)    HCT 35.0 (*)    Hemoglobin 11.9 (*)    All other components within normal limits  RESP PANEL BY RT-PCR (RSV, FLU A&B, COVID)  RVPGX2  HEPATITIS B SURFACE ANTIGEN  HEPATITIS B SURFACE ANTIBODY, QUANTITATIVE                                                                                                                          Radiology DG Chest Port 1 View Result Date: 06/10/2023 CLINICAL DATA:  Shortness of breath and cough.  History of dialysis. EXAM: PORTABLE CHEST 1 VIEW COMPARISON:  Chest radiograph dated 04/08/2023. FINDINGS: Stable cardiomegaly. Aortic atherosclerosis. Suspected layering small right pleural effusion with right basilar opacity. Central pulmonary vascular congestion with bilateral interstitial prominence. No pneumothorax. No acute osseous abnormality. IMPRESSION: 1. Suspected layering small right pleural effusion with right basilar opacity, which could reflect atelectasis or infiltrate. 2. Cardiomegaly with pulmonary vascular congestion and findings suggestive of mild interstitial edema. Electronically Signed  By: Hart Robinsons M.D.   On: 06/10/2023 14:37    Pertinent labs & imaging results that were available during my care of the patient were reviewed by me and considered in my medical decision making (see MDM for details).  Medications Ordered in ED Medications  amLODipine (NORVASC) tablet 10 mg (has no administration in time range)  carvedilol (COREG) tablet 25 mg (has no administration in  time range)  hydrALAZINE (APRESOLINE) tablet 50 mg (has no administration in time range)  Vitamin D (Ergocalciferol) (DRISDOL) 1.25 MG (50000 UNIT) capsule 50,000 Units (has no administration in time range)  multivitamin (RENA-VIT) tablet 1 tablet (has no administration in time range)  sevelamer carbonate (RENVELA) tablet 2,400 mg (has no administration in time range)  heparin injection 5,000 Units (has no administration in time range)  acetaminophen (TYLENOL) tablet 650 mg (has no administration in time range)    Or  acetaminophen (TYLENOL) suppository 650 mg (has no administration in time range)  Chlorhexidine Gluconate Cloth 2 % PADS 6 each (has no administration in time range)  furosemide (LASIX) injection 80 mg (80 mg Intravenous Given 06/10/23 1141)                                                                                                                                     Procedures .Critical Care  Performed by: Lonell Grandchild, MD Authorized by: Lonell Grandchild, MD   Critical care provider statement:    Critical care time (minutes):  30   Critical care was time spent personally by me on the following activities:  Development of treatment plan with patient or surrogate, discussions with consultants, evaluation of patient's response to treatment, examination of patient, ordering and review of laboratory studies, ordering and review of radiographic studies, ordering and performing treatments and interventions, pulse oximetry, re-evaluation of patient's condition and review of old charts   Care discussed with: admitting provider     (including critical care time)  Medical Decision Making / ED Course   MDM:  58 year old presenting to the emergency department with shortness of breath.  On exam, has findings of volume overload.  Suspect CHF exacerbation/hypervolemia from end-stage renal disease.  Will give dose of Lasix as he does make urine.  Discussed with Dr. Chales Abrahams  with renal who will obtain dialysis for the patient.  Given his hypoxia, patient admitted for further observation to medicine       Additional history obtained: -Additional history obtained from ems -External records from outside source obtained and reviewed including: Chart review including previous notes, labs, imaging, consultation notes including prior notes    Lab Tests: -I ordered, reviewed, and interpreted labs.   The pertinent results include:   Labs Reviewed  COMPREHENSIVE METABOLIC PANEL - Abnormal; Notable for the following components:      Result Value   Chloride 92 (*)    Glucose, Bld 138 (*)    BUN  60 (*)    Creatinine, Ser 12.30 (*)    Albumin 3.4 (*)    GFR, Estimated 4 (*)    Anion gap 18 (*)    All other components within normal limits  CBC WITH DIFFERENTIAL/PLATELET - Abnormal; Notable for the following components:   WBC 11.0 (*)    RBC 3.45 (*)    Hemoglobin 11.0 (*)    HCT 33.5 (*)    Neutro Abs 8.8 (*)    All other components within normal limits  I-STAT CHEM 8, ED - Abnormal; Notable for the following components:   Sodium 134 (*)    BUN 55 (*)    Creatinine, Ser 13.40 (*)    Glucose, Bld 132 (*)    Hemoglobin 11.9 (*)    HCT 35.0 (*)    All other components within normal limits  I-STAT VENOUS BLOOD GAS, ED - Abnormal; Notable for the following components:   Bicarbonate 28.3 (*)    Acid-Base Excess 3.0 (*)    HCT 35.0 (*)    Hemoglobin 11.9 (*)    All other components within normal limits  RESP PANEL BY RT-PCR (RSV, FLU A&B, COVID)  RVPGX2  HEPATITIS B SURFACE ANTIGEN  HEPATITIS B SURFACE ANTIBODY, QUANTITATIVE    Notable for signs of ESRD   EKG   EKG Interpretation Date/Time:  Monday June 10 2023 11:02:11 EST Ventricular Rate:  92 PR Interval:  172 QRS Duration:  82 QT Interval:  380 QTC Calculation: 471 R Axis:   -14  Text Interpretation: Sinus rhythm Probable left atrial enlargement Confirmed by Alvino Blood ([id number]) on  06/10/2023 2:36:56 PM         Imaging Studies ordered: I ordered imaging studies including CXR On my interpretation imaging demonstrates pulmonary edema  I independently visualized and interpreted imaging. I agree with the radiologist interpretation   Medicines ordered and prescription drug management: Meds ordered this encounter  Medications   furosemide (LASIX) injection 80 mg   amLODipine (NORVASC) tablet 10 mg   carvedilol (COREG) tablet 25 mg   hydrALAZINE (APRESOLINE) tablet 50 mg   Vitamin D (Ergocalciferol) (DRISDOL) 1.25 MG (50000 UNIT) capsule 50,000 Units   multivitamin (RENA-VIT) tablet 1 tablet   sevelamer carbonate (RENVELA) tablet 2,400 mg   heparin injection 5,000 Units   OR Linked Order Group    acetaminophen (TYLENOL) tablet 650 mg    acetaminophen (TYLENOL) suppository 650 mg   Chlorhexidine Gluconate Cloth 2 % PADS 6 each    -I have reviewed the patients home medicines and have made adjustments as needed   Consultations Obtained: I requested consultation with the nephrologist,  and discussed lab and imaging findings as well as pertinent plan - they recommend: HD   Cardiac Monitoring: The patient was maintained on a cardiac monitor.  I personally viewed and interpreted the cardiac monitored which showed an underlying rhythm of: NSR  Social Determinants of Health:  Diagnosis or treatment significantly limited by social determinants of health: obesity   Reevaluation: After the interventions noted above, I reevaluated the patient and found that their symptoms have improved  Co morbidities that complicate the patient evaluation  Past Medical History:  Diagnosis Date   Acute kidney injury (HCC) 12/28/2014   Adjustment disorder with mixed anxiety and depressed mood 12/28/2014   Asthma    Bacteremia due to Pseudomonas 12/01/2022   CHF (congestive heart failure) (HCC)    CPAP (continuous positive airway pressure) dependence    Diabetes mellitus  without complication (HCC)  DM (diabetes mellitus) type II controlled with renal manifestation (HCC) 12/28/2014   DM (diabetes mellitus) type II controlled, neurological manifestation (HCC) 12/28/2014   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HCC)    Encephalopathy, hypertensive 12/28/2014   GERD (gastroesophageal reflux disease)    Hypertension    Hypertensive emergency without congestive heart failure 12/27/2014   Pleural effusion    Pneumonia    Possible Panic disorder 12/28/2014   Renal disorder    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    Final Clinical Impression(s) / ED Diagnoses Final diagnoses:  Acute hypoxic respiratory failure (HCC)     This chart was dictated using voice recognition software.  Despite best efforts to proofread,  errors can occur which can change the documentation meaning.    Lonell Grandchild, MD 06/10/23 [phone]

## 2023-06-10 NOTE — Consult Note (Signed)
 Renal Service Consult Note [facility]  [place] 06/10/2023 Maree Krabbe, MD Requesting Physician: Dr. Jennette Kettle, Kathie Rhodes.   Reason for Consult: ESRD pt w/ c/o SOB. Hx of cough x 1 year.  HPI: The patient is a 58 y.o. year-old w/ PMH as below who presented c/o SOB. Also hx of cough > 1 year. [place]ed in [place] post 9/11 and has lungs "polyps, nodules" per the patient. EMS gave pt albuterol and atrovent negs. Missed last HD on Friday 2/28. BP 228/128, HR 90, RR 30. NSR. In ED CXR not suggestive of PNA, resp panel neg. CXR showed cardiomegaly with pulmonary vascular congestion and findings suggestive of IS edema. Pt admitted and we are asked to see for dialysis.    Pt seen in hospital room. Having significant coughing spells. Pt states that he started with the coughing spells for months to years. He started having orthopnea about 2 yrs ago and has bought different hospital like beds that will allow him to elevate the upper torso while asleep. Has "polyps" on his lungs that attributes to working in [place] post 9/11. Does not have home O2, but his neighbor loaned him his oxygen tank at one point for him to use when feeling really bad.    PMH Asthma Adjustment disorder w/ mixed anxiety and depressed mood Pseudomonas bacteremia DM2 Lung empyema ESRD on HD MWF HTN/ resistant HTN OSA on CPAP Hypertensive encephalopathy   ROS - denies CP, no joint pain, no HA, no blurry vision, no rash, no diarrhea, no nausea/ vomiting, no dysuria, no difficulty voiding   Past Medical History  Past Medical History:  Diagnosis Date   Acute kidney injury (HCC) 12/28/2014   Adjustment disorder with mixed anxiety and depressed mood 12/28/2014   Asthma    Bacteremia due to Pseudomonas 12/01/2022   CHF (congestive heart failure) (HCC)    CPAP (continuous positive airway pressure) dependence    Diabetes mellitus without complication (HCC)    DM (diabetes mellitus) type II controlled with renal manifestation  (HCC) 12/28/2014   DM (diabetes mellitus) type II controlled, neurological manifestation (HCC) 12/28/2014   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HCC)    Encephalopathy, hypertensive 12/28/2014   GERD (gastroesophageal reflux disease)    Hypertension    Hypertensive emergency without congestive heart failure 12/27/2014   Pleural effusion    Pneumonia    Possible Panic disorder 12/28/2014   Renal disorder    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   Sleep apnea    Past Surgical History  Past Surgical History:  Procedure Laterality Date   A/V FISTULAGRAM N/A 05/23/2023   Procedure: A/V Fistulagram;  Surgeon: Tyler Pita, MD;  Location: [facility] IN[facility];  Service: Cardiovascular;  Laterality: N/A;   AV FISTULA PLACEMENT Left 01/05/2020   Procedure: LEFT ARM BRACHIOCEPHALIC ARTERIOVENOUS (AV) FISTULA;  Surgeon: Chuck Hint, MD;  Location: [facility] OR;  Service: Vascular;  Laterality: Left;   EMPYEMA DRAINAGE Right 05/17/2017   Procedure: EMPYEMA DRAINAGE AND DECORTICATION;  Surgeon: Delight Ovens, MD;  Location: [facility] OR;  Service: Thoracic;  Laterality: Right;   FLEXIBLE BRONCHOSCOPY  05/17/2017   Procedure: FLEXIBLE BRONCHOSCOPY;  Surgeon: Delight Ovens, MD;  Location: [facility] OR;  Service: Thoracic;;   INSERTION OF DIALYSIS CATHETER Right 12/12/2021   Procedure: INSERTION OF RIGHT INTERNAL JUGULAR DIALYSIS CATHETER;  Surgeon: Chuck Hint, MD;  Location: [facility] OR;  Service: Vascular;  Laterality: Right;   IR FLUORO GUIDE CV LINE RIGHT  01/01/2020   IR THORACENTESIS ASP PLEURAL  SPACE W/IMG GUIDE  05/17/2017   IR US GUIDE VASC ACCESS RIGHT  01/01/2020   NO PAST SURGERIES     PERIPHERAL VASCULAR BALLOON ANGIOPLASTY  05/23/2023   Procedure: PERIPHERAL VASCULAR BALLOON ANGIOPLASTY;  Surgeon: Tyler Pita, MD;  Location: [facility]VAS[facility] CV LAB;  Service: Cardiovascular;;   REVISON OF ARTERIOVENOUS FISTULA Left 12/12/2021   Procedure: REVISION OF LEFT ARM FISTULA BY PLICATION;  Surgeon: Chuck Hint,  MD;  Location: [facility] OR;  Service: Vascular;  Laterality: Left;   THORACOTOMY/LOBECTOMY Right 05/17/2017   Procedure: RIGHT MINI THORACOTOMY;  Surgeon: Delight Ovens, MD;  Location: [facility] OR;  Service: Thoracic;  Laterality: Right;   VIDEO ASSISTED THORACOSCOPY (VATS)/EMPYEMA Right 05/17/2017   Procedure: RIGHT VIDEO ASSISTED THORACOSCOPY (VATS)/EMPYEMA;  Surgeon: Delight Ovens, MD;  Location: [facility] OR;  Service: Thoracic;  Laterality: Right;   Family History  Family History  Problem Relation Age of Onset   Hypertension Mother    Lung cancer Father    Social History  reports that he has never smoked. He has never been exposed to tobacco smoke. He has never used smokeless tobacco. He reports that he does not drink alcohol and does not use drugs. Allergies  Allergies  Allergen Reactions   Oxycodone Itching   Home medications Prior to Admission medications   Medication Sig Start Date End Date Taking? Authorizing Provider  albuterol (PROVENTIL) (2.5 MG/3ML) 0.083% nebulizer solution Take 3 mLs (2.5 mg total) by nebulization every 6 (six) hours as needed for wheezing or shortness of breath. 09/25/22  Yes Alfonse Spruce, MD  albuterol (VENTOLIN HFA) 108 (90 Base) MCG/ACT inhaler Inhale 2 puffs into the lungs every 6 (six) hours as needed for wheezing or shortness of breath. 09/25/22  Yes Alfonse Spruce, MD  amLODipine (NORVASC) 10 MG tablet Take 1 tablet (10 mg total) by mouth daily. 05/24/22  Yes Rocky Morel, DO  benzonatate (TESSALON) 100 MG capsule Take 200 mg by mouth 3 (three) times daily as needed for cough. 04/18/23  Yes [provider]  Budeson-Glycopyrrol-Formoterol (BREZTRI AEROSPHERE) 160-9-4.8 MCG/ACT AERO Inhale 2 puffs into the lungs in the morning and at bedtime. 10/29/22  Yes [provider]  busPIRone (BUSPAR) 5 MG tablet Take 5 mg by mouth 2 (two) times daily. 02/05/23  Yes [provider]  calcitRIOL (ROCALTROL) 0.25 MCG capsule Take 1  capsule (0.25 mcg total) by mouth every Monday, Wednesday, and Friday. 01/08/20  Yes Albertine Grates, MD  camphor-menthol [facility]) lotion Apply topically 2 (two) times daily. Patient taking differently: Apply 1 Application topically 2 (two) times daily. 12/03/22  Yes Lockie Mola, MD  carvedilol (COREG) 25 MG tablet Take 1 tablet (25 mg total) by mouth 2 (two) times daily with a meal. 05/24/22  Yes Rocky Morel, DO  hydrOXYzine (ATARAX) 25 MG tablet Take 25 mg by mouth daily. 02/08/23  Yes [provider]  levocetirizine (XYZAL) 5 MG tablet Take 1 tablet (5 mg total) by mouth every evening. 09/25/22  Yes Alfonse Spruce, MD  lidocaine (XYLOCAINE) 2 % jelly Apply 1 Application topically daily. 11/26/22  Yes Alvira Monday, MD  multivitamin (RENA-VIT) TABS tablet Take 1 tablet by mouth at bedtime. 05/24/22  Yes Rocky Morel, DO  ondansetron (ZOFRAN-ODT) 4 MG disintegrating tablet Take 4 mg by mouth daily as needed for vomiting or nausea. 04/18/23  Yes [provider]  polyethylene glycol powder (GLYCOLAX/MIRALAX) 17 GM/SCOOP powder Mix as directed and take 1 capful (17 g) by mouth daily. Patient  taking differently: Take 17 g by mouth daily as needed for mild constipation. 02/20/23  Yes Morene Crocker, MD  sevelamer carbonate (RENVELA) 800 MG tablet Take 3 tablets (2,400 mg total) by mouth 3 (three) times daily with meals. 12/03/22  Yes Lockie Mola, MD  traMADol (ULTRAM) 50 MG tablet Take 1 tablet (50 mg total) by mouth every 12 (twelve) hours as needed for moderate pain (pain score 4-6). 01/24/23  Yes Peterson Ao, MD  Vitamin D, Ergocalciferol, (DRISDOL) 1.25 MG (50000 UNIT) CAPS capsule Take 50,000 Units by mouth See admin instructions. Take one tablet by mouth on Monday, Wednesdays and Fridays per patient 08/17/19  Yes [provider]  XPHOZAH 30 MG TABS Take 1 tablet by mouth 2 (two) times daily. 01/15/23  Yes [provider]  Accu-Chek Softclix Lancets  lancets Use as directed.  For insulin-dependent type 2 diabetes Monitor  glucose 3 times a day Patient taking differently: 1 each by Other route See admin instructions. Use as directed.  For insulin-dependent type 2 diabetes Monitor  glucose 3 times a day 01/06/20   Albertine Grates, MD  gabapentin (NEURONTIN) 300 MG capsule Take 1 capsule (300 mg total) by mouth daily. Patient not taking: Reported on 06/10/2023 01/25/23   Peterson Ao, MD  glucose blood test strip Use as instructed For insulin-dependent type 2 diabetes Monitor  glucose 3 times a day Patient taking differently: 1 each by Other route See admin instructions. Use as instructed For insulin-dependent type 2 diabetes Monitor  glucose 3 times a day 01/06/20   Albertine Grates, MD  hydrALAZINE (APRESOLINE) 50 MG tablet Take 1 tablet (50 mg total) by mouth 3 (three) times daily. Patient taking differently: Take 50 mg by mouth 3 (three) times daily. Patient out of medication 04/08/2023, for about a month. 02/19/23 05/23/23  Morene Crocker, MD  Iron Sucrose (VENOFER IV) Inject 50 mg into the vein See admin instructions. Given on days of Dialysis: Monday, Wednesday, and Friday. 03/08/23 03/06/24  [provider]  insulin aspart protamine - aspart (NOVOLOG MIX 70/30 FLEXPEN) (70-30) 100 UNIT/ML FlexPen Inject 15-30 Units into the skin 2 (two) times daily.  05/24/22  [provider]     Vitals:   06/10/23 1400 06/10/23 1513 06/10/23 1515 06/10/23 1554  BP: (!) 206/118  (!) 187/100 (!) 214/115  Pulse: 91  94 91  Resp: (!) 21  17 16   Temp: 97.7 F (36.5 C) 98.2 F (36.8 C)  98 F (36.7 C)  TempSrc: Oral Axillary  Oral  SpO2: 99%  96% 97%  Weight:      Height:       Exam Gen alert, sitting up on side of bed, nasal O2 Mild ^wob  No rash, cyanosis or gangrene Sclera anicteric, throat clear  No jvd or bruits Chest scattered rales bilat bases, no wheezing noted RRR no MRG Abd soft ntnd no mass or ascites +bs GU nl  male MS no joint effusions or deformity Ext no LE or UE edema, no other edema Neuro is alert, Ox 3 , nf    L AVF +bruit    Renal-related home meds: - pending    OP HD: SW MWF 4h  B400   95.5kg  2K bath  AVF  Heparin 5000 + 2000 midrun   - missed HD last Friday 2/28  Assessment/ Plan: Acute hypoxic resp failure - on [place] O2. Long hx of pulm edema looking back at the last year of CXR's. Consider non volume related possibilities  as well. Will see if HD/ vol lowering makes any difference. Max UF w/ HD this evening.  ESRD - on HD MWF. Missed 2/28 dialysis. HD tonight.   HTN - poorly controlled BP today. 220/110 in ED. Get vol down, cont home BP meds and prn IV meds.   Volume - most likely volume overload, making it to his dry wt but perhaps has lost body wt. May have some other type of lung disease as well causing all this coughing and DOE/ orthopnea.  Anemia of esrd - Hb 11-12 here, follow.  Secondary hyperparathyroidism - CCa borderline high. Add phos, cont binders w/ meals.  Atrial fib - per pmd DM2      Rob Dickey Caamano  MD [facility] 06/10/2023, 4:20 PM  Recent Labs  Lab 06/10/23 1142 06/10/23 1153 06/10/23 1158  HGB 11.0* 11.9* 11.9*  ALBUMIN 3.4*  --   --   CALCIUM 10.2  --   --   CREATININE 12.30*  --  13.40*  K 4.9 4.9 4.9   Inpatient medications:  amLODipine  10 mg Oral Daily   carvedilol  25 mg Oral BID WC   heparin  5,000 Units Subcutaneous Q8H   hydrALAZINE  50 mg Oral TID   multivitamin  1 tablet Oral QHS   [START ON 06/11/2023] sevelamer carbonate  2,400 mg Oral TID WC   Vitamin D (Ergocalciferol)  50,000 Units Oral Q M,W,F    acetaminophen **OR** acetaminophen

## 2023-06-10 NOTE — Assessment & Plan Note (Signed)
 Pt with history of A fib, previously on Eliquis but not for the past couple years for unknown reason. Given uncertainty, will not restart Eliquis at this time. Last echo done 02/2023 with LVEF 60-65%, Grade I diastolic dysfunction. Pt not in A fib at this time. - Pt to continue carvedilol 25 mg BID - Will get echo to reassess cardiac function in setting of newly worsened shortness of breath

## 2023-06-10 NOTE — Assessment & Plan Note (Signed)
 Pt with ESRD on MWF dialysis. Missed dialysis today (3/3) but otherwise reports compliance. - Nephro consulted; will resume dialysis.

## 2023-06-10 NOTE — H&P (Addendum)
 Hospital Admission History and Physical Service Pager: [phone]  Patient name: [name] Medical record number: [id number] Date of Birth: 11-14-1965 Age: 58 y.o. Gender: male  Primary Care Provider: Vladimir Crofts, FNP Consultants: Nephrology Code Status: FULL CODE Preferred Emergency Contact: Edwinna Areola [phone]   Chief Complaint: Shortness of breath  Assessment and Plan: Shaurya Trigo is a 58 y.o. male with PMHx significant for ESRD on dialysis, paroxysmal atrial fibrillation, T2DM, HTN presenting with shortness of breath. Differential for presentation of this includes volume overload in setting of missed dialysis with CXR findings suggestive of extra fluid and similar prior hospitalizations. Also considered pneumonia given slightly elevated white count in setting of worsened cough and dyspnea, though less likely with CXR findings not suggestive of PNA and negative respiratory panel. Lower concern for MI given EKG showing NSR. Lower concern for PE given Wells score of 1 (possible hemoptysis, though more likely irritation from cough).  Assessment & Plan Acute hypoxic respiratory failure (HCC) Pt presenting with hypoxemia requiring 2L O2 supplementation via [place]. Also with acute on chronic cough. Suspect symptoms due to extra fluid in setting of missed dialysis. No known COPD/asthma, but pt on Breztri and albuterol nebulizer with reported improvement in dyspnea with use. - Continue O2 supplementation with 2L via [place]; wean as tolerated - Pt to undergo dialysis; reassess symptoms following - Continue Breztri, albuterol inhaler; consider further pulmonology workup if symptoms not improved with dialysis - Consider further cardiology workup if symptoms not improved with dialysis ESRD (end stage renal disease) (HCC) Pt with ESRD on MWF dialysis. Missed dialysis today (3/3) but otherwise reports compliance. - Nephro consulted; will resume dialysis. Severe uncontrolled hypertension Pt  with BP as high as 231/130. Known severe BP, and no evidence of end organ damage.  Reports headache which he states is due to increased coughing, no abdominal pain. Did not take home medications today. - Restarting pt on carvedilol 25 mg BID, hydralazine 50 mg TID, amlodipine 10 mg daily AF (paroxysmal atrial fibrillation) (HCC) Pt with history of A fib, previously on Eliquis but not for the past couple years for unknown reason. Given uncertainty, will not restart Eliquis at this time. Last echo done 02/2023 with LVEF 60-65%, Grade I diastolic dysfunction. Pt not in A fib at this time. - Pt to continue carvedilol 25 mg BID - Will get echo to reassess cardiac function in setting of newly worsened shortness of breath DMII (diabetes mellitus, type 2) (HCC) Pt managed with diet control. On gabapentin for neuropathy. - Will continue gabapentin 300 mg daily  Chronic and Stable Problems: Anemia: Likely ESRD, stable at baseline.  FEN/GI: No indication for IV fluids; renal diet with fluid restriction of 2000 mL VTE Prophylaxis: Heparin  Disposition: Med-Surg  History of Present Illness:  [name] is a 58 y.o. male with PMHx significant for ESRD on dialysis, paroxysmal atrial fibrillation, T2DM, HTN presenting with shortness of breath.   He reports he's had a cough for the past year along with intermittently worsened dyspnea. Cough worsened yesterday (3/2) and he experienced worsened dyspnea along with it. Cough productive with white mucus, intermittent red/pink tinge. Continue to report cough, dyspnea today, also with headache and emesis (of mucus) this morning due to cough. No fevers, chills, night sweats. Cough not worsened with lying flat. No sick contacts.  Has ESRD on MWF dialysis. Last dialysis session on Friday 2/28. Supposed to have session today (3/3), but did not make it given feeling poorly with cough  and dyspnea. Reports he had not missed any other dialysis sessions in past month.  Reports taking all medications regularly, except for today.  Brought in by EMS due to dyspnea, given 5 albuterol and Atrovent by EMS. In the ED, he was started on 2L O2 via [place]. EKG showed NSR. CXR showed small right pleural effusion, cardiomegaly with pulmonary vascular congestion and findings suggestive of mild interstitial edema. Flu/COVID testing done which was negative. Given 80 mg Lasix IV.  Review Of Systems: Per HPI with the following additions: No rhinorrhea, congestion. No reflux, heartburn. No abdominal pain.  Pertinent Past Medical History: ESRD on dialysis Paroxysmal Atrial Fibrillation Anemia in chronic kidney disease Hypertension T2DM Remainder reviewed in history tab.   Pertinent Past Surgical History: Prior lung empyema requiring chest tube placement in 2019.  Remainder reviewed in history tab.   Pertinent Social History: Tobacco use: No Alcohol use: none Other Substance use: denies Lives with spouse  Pertinent Family History: Possible cardiac disease in grandfather.  Remainder reviewed in history tab.   Important Outpatient Medications: ESRD: Reva-Vit, Rocaltrol PAF: Carvedilol; no chronic anticoagulation currently (hx of Eliquis, pt no longer taking for unclear reasons) HTN: Coreg, amlodipine, hydralazine T2DM: Gabapentin Breztri, Albuterol nebulizer (no known history of COPD or asthma, pt reports helps with dyspnea) Remainder reviewed in medication history.   Objective: BP (!) 214/115 (BP Location: Right Arm)   Pulse 91   Temp 98 F (36.7 C) (Oral)   Resp 16   Ht 5\' 7"  (1.702 m)   Wt 100.2 kg   SpO2 97%   BMI 34.61 kg/m  Exam: General: Pt is lying on side in bed with head of bed elevated, occasional cough. No acute distress. Eyes: Conjunctivae normal. EOMI bilaterally. ENTM: No rhinorrhea, congestion. Moist mucous membranes. Cardiovascular: Regular rate and rhythm, no murmurs/rubs/gallops Respiratory: Normal work of breathing, intermittent  cough. Lungs without crackles, rhonchi, wheeze; some transmitted upper respiratory sounds. Gastrointestinal: Normal bowel sounds. Soft, nontender to palpation. MSK: Moves all 4 extremities without issue. Derm: Chronic-appearing wound of L shin, no active drainage. Minimal bilateral ankle edema. Neuro: Alert and oriented to person, place, event. Psych: Normal affect.  Labs:  CBC BMET  Recent Labs  Lab 06/10/23 1142 06/10/23 1153 06/10/23 1158  WBC 11.0*  --   --   HGB 11.0*   < > 11.9*  HCT 33.5*   < > 35.0*  PLT 381  --   --    < > = values in this interval not displayed.   Recent Labs  Lab 06/10/23 1142 06/10/23 1153 06/10/23 1158  NA 135   < > 134*  K 4.9   < > 4.9  CL 92*  --  99  CO2 25  --   --   BUN 60*  --  55*  CREATININE 12.30*  --  13.40*  GLUCOSE 138*  --  132*  CALCIUM 10.2  --   --    < > = values in this interval not displayed.    Pertinent additional labs: Respiratory panel negative for flu, Covid   EKG: My own interpretation (not copied from electronic read) Normal Sinus Rhythm    Imaging Studies Performed:  Chest Xray Impression from Radiologist: 1. Suspected layering small right pleural effusion with right basilar opacity, which could reflect atelectasis or infiltrate. 2. Cardiomegaly with pulmonary vascular congestion and findings suggestive of mild interstitial edema.   My Interpretation: Bilateral fluffy opacities more prominent in lower lung fields. Cardiomegaly.  Governor Rooks, Medical Student 06/10/2023, 4:05 PM  I was personally present and performed or re-performed the history, physical exam and medical decision making activities of this service and have verified that the service and findings are accurately documented in the student's note. Additionally the plan above was created by the supervising resident, and discussed with medical student.  Tiffany Kocher, DO                  06/10/2023, 4:48 PM   FPTS Intern pager: [phone], text  pages welcome Secure chat group [facility] Teaching Service

## 2023-06-10 NOTE — Consult Note (Signed)
 NAME:  [name], MRN:  [id number], DOB:  Dec 28, 1965, LOS: 0 ADMISSION DATE:  06/10/2023, CONSULTATION DATE:  06/10/23 REFERRING MD:  [name]-FMTS, CHIEF COMPLAINT:  cough, hypoxia   History of Present Illness:  Mr. [name] is a 58 y/o gentleman with a history of ESRD, inhalational exposures during 9/11 with chronic asthma and chronic cough who presented with several days of worsening cough, rhinorrhea, post-nasal drip. He has been more SOB with coughing paroxysms with drops in saturations to 88-90% at home, which he has been using his neighbor's oxygen for. He denies n/v/d, rashes, synovitis, other new symptoms. No fever or chills. Last HD on 2/28. PCCM consulted for cough and respiratory failure.  Pertinent  Medical History  ESRD on HD inhalational exposure during 9/11 OSA HTN DM GERD  Significant Hospital Events: Including procedures, antibiotic start and stop dates in addition to other pertinent events   3/3 admitted  Interim History / Subjective:    Objective   Blood pressure (!) 157/90, pulse 82, temperature 98 F (36.7 C), temperature source Oral, resp. rate 18, height 5\' 7"  (1.702 m), weight 100.2 kg, SpO2 97%.       No intake or output data in the 24 hours ending 06/10/23 1805 Filed Weights   06/10/23 1103  Weight: 100.2 kg    Examination: General: ill appearing man lying in bed in NAD HENT: Onset/AT, eyes anicteric, conjunctival injection Lungs: coughing so frequently he can hardly speak, mostly dry sounding. Nasal sounding voice. Faint rhales in R base, no rhonchi. Cardiovascular: S1S2, RRR Abdomen: obese, soft, NT Extremities: LUE fistula.  Neuro: awake, alert, moving extremities, trying to answer quesitons Derm: warm, dry, no rashes  CXR personally reviewed> RLL infiltrate vs layering effusion  BUN 60 Cr 12.3 WBC 11 H/H 11/33.5 Platelets 381  Resolved Hospital Problem list     Assessment & Plan:  Acute respiratory failure with hypoxia Intractable  cough Likely RLL pneumonia Volume overload due to missed HD -aggressive cough suppression- scheduled hycodan and phenergan cough syrups overnight.   -switch DPI to nebs- brovana, pulmicort, yupelri until able to take deep breaths and hold -wean O2 as able -iHD per nephro for volume management -agree with CAP antibiotics; checking PCT to hopefully confirm diagnosis  Best Practice (right click and "Reselect all SmartList Selections" daily)   Per primary  Labs   CBC: Recent Labs  Lab 06/10/23 1142 06/10/23 1153 06/10/23 1158  WBC 11.0*  --   --   NEUTROABS 8.8*  --   --   HGB 11.0* 11.9* 11.9*  HCT 33.5* 35.0* 35.0*  MCV 97.1  --   --   PLT 381  --   --     Basic Metabolic Panel: Recent Labs  Lab 06/10/23 1142 06/10/23 1153 06/10/23 1158  NA 135 135 134*  K 4.9 4.9 4.9  CL 92*  --  99  CO2 25  --   --   GLUCOSE 138*  --  132*  BUN 60*  --  55*  CREATININE 12.30*  --  13.40*  CALCIUM 10.2  --   --    GFR: Estimated Creatinine Clearance: 6.9 mL/min (A) (by C-G formula based on SCr of 13.4 mg/dL (H)). Recent Labs  Lab 06/10/23 1142  WBC 11.0*    Liver Function Tests: Recent Labs  Lab 06/10/23 1142  AST 15  ALT 10  ALKPHOS 64  BILITOT 0.6  PROT 7.3  ALBUMIN 3.4*   No results for input(s): "LIPASE", "AMYLASE" in the  last 168 hours. No results for input(s): "AMMONIA" in the last 168 hours.  ABG    Component Value Date/Time   PHART 7.286 (L) 05/17/2017 1817   PCO2ART 46.6 05/17/2017 1817   PO2ART 81.0 (L) 05/17/2017 1817   HCO3 28.3 (H) 06/10/2023 1153   TCO2 27 06/10/2023 1158   ACIDBASEDEF 0.8 03/19/2022 0305   O2SAT 66 06/10/2023 1153     Coagulation Profile: No results for input(s): "INR", "PROTIME" in the last 168 hours.  Cardiac Enzymes: No results for input(s): "CKTOTAL", "CKMB", "CKMBINDEX", "TROPONINI" in the last 168 hours.  HbA1C: Hgb A1c MFr Bld  Date/Time Value Ref Range Status  04/09/2023 06:13 AM 6.7 (H) 4.8 - 5.6 % Final     Comment:    (NOTE)         Prediabetes: 5.7 - 6.4         Diabetes: >6.4         Glycemic control for adults with diabetes: <7.0   11/30/2022 10:33 PM 7.2 (H) 4.8 - 5.6 % Final    Comment:    (NOTE) Pre diabetes:          5.7%-6.4%  Diabetes:              >6.4%  Glycemic control for   <7.0% adults with diabetes     CBG: No results for input(s): "GLUCAP" in the last 168 hours.  Review of Systems:   Review of Systems  Constitutional:  Negative for chills and fever.  HENT:  Positive for congestion.   Respiratory:  Positive for cough and shortness of breath.   Cardiovascular:  Negative for chest pain and leg swelling.  Gastrointestinal:  Negative for diarrhea, heartburn, nausea and vomiting.  Musculoskeletal:  Negative for myalgias.  Skin:  Negative for rash.  Neurological:  Negative for dizziness.     Past Medical History:  He,  has a past medical history of Adjustment disorder with mixed anxiety and depressed mood (12/28/2014), Asthma, Bacteremia due to Pseudomonas (12/01/2022), CHF (congestive heart failure) (HCC), CPAP (continuous positive airway pressure) dependence, Diabetes mellitus without complication (HCC), DM (diabetes mellitus) type II controlled with renal manifestation (HCC) (12/28/2014), DM (diabetes mellitus) type II controlled, neurological manifestation (HCC) (12/28/2014), 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HCC), Encephalopathy, hypertensive (12/28/2014), ESRD on hemodialysis (HCC), GERD (gastroesophageal reflux disease), Hypertension, Hypertensive emergency without congestive heart failure (12/27/2014), Pleural effusion, Pneumonia, Possible Panic disorder (12/28/2014), 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   Surgical History:   Past Surgical History:  Procedure Laterality Date   A/V FISTULAGRAM N/A 05/23/2023   Procedure: A/V Fistulagram;  Surgeon: [name] Pita, MD;  Location: [facility] INVASIVE CV LAB;  Service: Cardiovascular;  Laterality: N/A;   AV FISTULA  PLACEMENT Left 01/05/2020   Procedure: LEFT ARM BRACHIOCEPHALIC ARTERIOVENOUS (AV) FISTULA;  Surgeon: [name] Hint, MD;  Location: [facility] OR;  Service: Vascular;  Laterality: Left;   EMPYEMA DRAINAGE Right 05/17/2017   Procedure: EMPYEMA DRAINAGE AND DECORTICATION;  Surgeon: Delight Ovens, MD;  Location: [facility] OR;  Service: Thoracic;  Laterality: Right;   FLEXIBLE BRONCHOSCOPY  05/17/2017   Procedure: FLEXIBLE BRONCHOSCOPY;  Surgeon: Delight Ovens, MD;  Location: [facility] OR;  Service: Thoracic;;   INSERTION OF DIALYSIS CATHETER Right 12/12/2021   Procedure: INSERTION OF RIGHT INTERNAL JUGULAR DIALYSIS CATHETER;  Surgeon: [name] Hint, MD;  Location: [facility] OR;  Service: Vascular;  Laterality: Right;   IR FLUORO GUIDE CV LINE RIGHT  01/01/2020   IR THORACENTESIS ASP PLEURAL SPACE W/IMG GUIDE  05/17/2017  IR US GUIDE VASC ACCESS RIGHT  01/01/2020   NO PAST SURGERIES     PERIPHERAL VASCULAR BALLOON ANGIOPLASTY  05/23/2023   Procedure: PERIPHERAL VASCULAR BALLOON ANGIOPLASTY;  Surgeon: [name] Pita, MD;  Location: [facility] INVASIVE CV LAB;  Service: Cardiovascular;;   REVISON OF ARTERIOVENOUS FISTULA Left 12/12/2021   Procedure: REVISION OF LEFT ARM FISTULA BY PLICATION;  Surgeon: [name] Hint, MD;  Location: [facility] OR;  Service: Vascular;  Laterality: Left;   THORACOTOMY/LOBECTOMY Right 05/17/2017   Procedure: RIGHT MINI THORACOTOMY;  Surgeon: Delight Ovens, MD;  Location: [facility] OR;  Service: Thoracic;  Laterality: Right;   VIDEO ASSISTED THORACOSCOPY (VATS)/EMPYEMA Right 05/17/2017   Procedure: RIGHT VIDEO ASSISTED THORACOSCOPY (VATS)/EMPYEMA;  Surgeon: Delight Ovens, MD;  Location: [facility] OR;  Service: Thoracic;  Laterality: Right;     Social History:   reports that he has never smoked. He has never been exposed to tobacco smoke. He has never used smokeless tobacco. He reports that he does not drink alcohol and does not use drugs.   Family History:  His family history includes  Hypertension in his mother; Lung cancer in his father.   Allergies Allergies  Allergen Reactions   Oxycodone Itching     Home Medications  Prior to Admission medications   Medication Sig Start Date End Date Taking? Authorizing Provider  albuterol (PROVENTIL) (2.5 MG/3ML) 0.083% nebulizer solution Take 3 mLs (2.5 mg total) by nebulization every 6 (six) hours as needed for wheezing or shortness of breath. 09/25/22  Yes Alfonse Spruce, MD  albuterol (VENTOLIN HFA) 108 (90 Base) MCG/ACT inhaler Inhale 2 puffs into the lungs every 6 (six) hours as needed for wheezing or shortness of breath. 09/25/22  Yes Alfonse Spruce, MD  amLODipine (NORVASC) 10 MG tablet Take 1 tablet (10 mg total) by mouth daily. 05/24/22  Yes Rocky Morel, DO  benzonatate (TESSALON) 100 MG capsule Take 200 mg by mouth 3 (three) times daily as needed for cough. 04/18/23  Yes [provider]  Budeson-Glycopyrrol-Formoterol (BREZTRI AEROSPHERE) 160-9-4.8 MCG/ACT AERO Inhale 2 puffs into the lungs in the morning and at bedtime. 10/29/22  Yes [provider]  busPIRone (BUSPAR) 5 MG tablet Take 5 mg by mouth 2 (two) times daily. 02/05/23  Yes [provider]  calcitRIOL (ROCALTROL) 0.25 MCG capsule Take 1 capsule (0.25 mcg total) by mouth every Monday, Wednesday, and Friday. 01/08/20  Yes Albertine Grates, MD  camphor-menthol [facility]) lotion Apply topically 2 (two) times daily. Patient taking differently: Apply 1 Application topically 2 (two) times daily. 12/03/22  Yes Lockie Mola, MD  carvedilol (COREG) 25 MG tablet Take 1 tablet (25 mg total) by mouth 2 (two) times daily with a meal. 05/24/22  Yes Rocky Morel, DO  hydrOXYzine (ATARAX) 25 MG tablet Take 25 mg by mouth daily. 02/08/23  Yes [provider]  levocetirizine (XYZAL) 5 MG tablet Take 1 tablet (5 mg total) by mouth every evening. 09/25/22  Yes Alfonse Spruce, MD  lidocaine (XYLOCAINE) 2 % jelly Apply 1 Application topically  daily. 11/26/22  Yes Alvira Monday, MD  multivitamin (RENA-VIT) TABS tablet Take 1 tablet by mouth at bedtime. 05/24/22  Yes Rocky Morel, DO  ondansetron (ZOFRAN-ODT) 4 MG disintegrating tablet Take 4 mg by mouth daily as needed for vomiting or nausea. 04/18/23  Yes [provider]  polyethylene glycol powder (GLYCOLAX/MIRALAX) 17 GM/SCOOP powder Mix as directed and take 1 capful (17 g) by mouth daily. Patient taking differently: Take 17 g by  mouth daily as needed for mild constipation. 02/20/23  Yes Morene Crocker, MD  sevelamer carbonate (RENVELA) 800 MG tablet Take 3 tablets (2,400 mg total) by mouth 3 (three) times daily with meals. 12/03/22  Yes Lockie Mola, MD  traMADol (ULTRAM) 50 MG tablet Take 1 tablet (50 mg total) by mouth every 12 (twelve) hours as needed for moderate pain (pain score 4-6). 01/24/23  Yes Peterson Ao, MD  Vitamin D, Ergocalciferol, (DRISDOL) 1.25 MG (50000 UNIT) CAPS capsule Take 50,000 Units by mouth See admin instructions. Take one tablet by mouth on Monday, Wednesdays and Fridays per patient 08/17/19  Yes [provider]  XPHOZAH 30 MG TABS Take 1 tablet by mouth 2 (two) times daily. 01/15/23  Yes [provider]  Accu-Chek Softclix Lancets lancets Use as directed.  For insulin-dependent type 2 diabetes Monitor  glucose 3 times a day Patient taking differently: 1 each by Other route See admin instructions. Use as directed.  For insulin-dependent type 2 diabetes Monitor  glucose 3 times a day 01/06/20   Albertine Grates, MD  gabapentin (NEURONTIN) 300 MG capsule Take 1 capsule (300 mg total) by mouth daily. Patient not taking: Reported on 06/10/2023 01/25/23   Peterson Ao, MD  glucose blood test strip Use as instructed For insulin-dependent type 2 diabetes Monitor  glucose 3 times a day Patient taking differently: 1 each by Other route See admin instructions. Use as instructed For insulin-dependent type 2 diabetes Monitor   glucose 3 times a day 01/06/20   Albertine Grates, MD  hydrALAZINE (APRESOLINE) 50 MG tablet Take 1 tablet (50 mg total) by mouth 3 (three) times daily. Patient taking differently: Take 50 mg by mouth 3 (three) times daily. Patient out of medication 04/08/2023, for about a month. 02/19/23 05/23/23  Morene Crocker, MD  Iron Sucrose (VENOFER IV) Inject 50 mg into the vein See admin instructions. Given on days of Dialysis: Monday, Wednesday, and Friday. 03/08/23 03/06/24  [provider]  insulin aspart protamine - aspart (NOVOLOG MIX 70/30 FLEXPEN) (70-30) 100 UNIT/ML FlexPen Inject 15-30 Units into the skin 2 (two) times daily.  05/24/22  [provider]     Critical care time:       Steffanie Dunn, DO 06/10/23 6:05 PM [facility]  For contact information, see Amion. If no response to pager, please call PCCM consult pager. After hours, 7PM- 7AM, please call Elink.

## 2023-06-10 NOTE — Progress Notes (Signed)
 PT Cancellation Note  Patient Details Name: [name] MRN: [id number] DOB: Apr 13, 1965   Cancelled Treatment:    Reason Eval/Treat Not Completed: Medical issues which prohibited therapy  Hypertensive with BP 214/115; Will hold comprehensive PT evaluation at this time, likely follow up in AM. If urgent evaluation needed please secure chat myself of [facility] PT.  Kathlyn Sacramento, PT, DPT [facility]  Rehabilitation Services Physical Therapist Office: [phone] Website: [email or web address]   Berton Mount 06/10/2023, 4:00 PM

## 2023-06-10 NOTE — Assessment & Plan Note (Signed)
 Pt managed with diet control. On gabapentin for neuropathy. - Will continue gabapentin 300 mg daily

## 2023-06-10 NOTE — ED Triage Notes (Addendum)
 Pt BIB [facility] from home due to shortness of breath.  Pt does have polyps and nodules in lungs post 9/11 (worked in [place]).  Pt has been having cough for a year.  Pt reports red tinged sputum last night.  EMS gave 5 albuterol and atrovent.  Dialysis patient MWF; missed dialysis today.  Fistula on left side.  VS BP 222/128, HR 90, CBG 132, Resp 30.  NSR.

## 2023-06-10 NOTE — Hospital Course (Addendum)
 [name] is a 58 y.o. male with history of ESRD on dialysis, paroxysmal atrial fibrillation, HFpEF, T2DM, HTN, moderate persistent asthma(?) who was admitted for shortness of breath.  Acute hypoxic respiratory failure  Pneumonia Pt presented 3/3 with hypoxemia initially requiring O2 supplementation via [place]; weaned to RA on 3/5 though afterwards intermittently requested O2 for symptomatic relief in setting of normal O2 sats. Did not qualify for home O2 based on ambulatory O2 test. Initial CXR with signs of excess fluid, equivocal for RLL PNA so pt started on IV Azithromycin, Ceftriaxone; switched to po Azithromycin on 3/4 (completed 3 day course on 3/5); switched from Ceftriaxone to po Augmentin on 3/5 and will complete 5 day course on 3/7. RPP negative. Hx of moderate persistent asthma and pt on home Breztri and albuterol nebulizer; received [name], Pulmicort nebulizers while admitted and was restarted on home regimen at discharge. Also with acute on chronic cough, treated with phenergan and dextromethorphan. Started on pepcid for continued cough 3/5. Pt followed with PT/OT, who recommended home health at discharge.  ESRD (end stage renal disease) Pt with ESRD on MWF dialysis. Missed dialysis on 3/3, but otherwise reported compliance. Nephro was consulted and pt dialyzed 0100 on 3/4; resumed regular dialysis following. Dry weight per nephro ~94kg. Continued on Reva-Vit, Rocaltrol during hospitalization.  Severe uncontrolled hypertension Pt with BP as high as 231/130 on day of admission 3/3; he had not taken his home BP medications that day but otherwise reported compliance. He was restarted on carvedilol 25 mg BID, hydralazine 50 mg TID, amlodipine 10 mg daily with improvement in BP following. Still with intermittent elevated BPs into 150s-160s/90s, but will follow-up with outpatient provider for further BP management.  Other chronic conditions were medically managed with home medications  and formulary alternatives as necessary: HFpEF: Echo 06/2023 with EF unchanged (60-65%), grade II diastolic dysfunction, severe concentric LVH PAF: Continued on carvedilol 25 mg BID for rate control DMII: Diet controlled, continued on gabapentin 300 mg for neuropathy  Follow-up recommendations:  - Follow-up with cardiology outpatient (PCP to schedule); discuss restarting Eliquis - Follow-up with pulmonology outpatient, referral placed and they will call pt to schedule - Follow-up with PCP outpatient; monitor BP, consider outpatient sleep study for CPAP - Home Health established for discharge - Complete antibiotic regimen (Augmentin last dose on 3/7)

## 2023-06-10 NOTE — Assessment & Plan Note (Addendum)
 Pt presenting with hypoxemia requiring 2L O2 supplementation via [place]. Also with acute on chronic cough. Suspect symptoms due to extra fluid in setting of missed dialysis. No known COPD/asthma, but pt on Breztri and albuterol nebulizer with reported improvement in dyspnea with use. - Continue O2 supplementation with 2L via [place]; wean as tolerated - Pt to undergo dialysis; reassess symptoms following - Continue Breztri, albuterol inhaler; consider further pulmonology workup if symptoms not improved with dialysis - Consider further cardiology workup if symptoms not improved with dialysis

## 2023-06-10 NOTE — Assessment & Plan Note (Addendum)
 Pt with BP as high as 231/130. Known severe BP, and no evidence of end organ damage.  Reports headache which he states is due to increased coughing, no abdominal pain. Did not take home medications today. - Restarting pt on carvedilol 25 mg BID, hydralazine 50 mg TID, amlodipine 10 mg daily

## 2023-06-11 ENCOUNTER — Inpatient Hospital Stay (HOSPITAL_COMMUNITY)

## 2023-06-11 DIAGNOSIS — J9601 Acute respiratory failure with hypoxia: Secondary | ICD-10-CM | POA: Diagnosis not present

## 2023-06-11 DIAGNOSIS — I10 Essential (primary) hypertension: Secondary | ICD-10-CM

## 2023-06-11 DIAGNOSIS — R0609 Other forms of dyspnea: Secondary | ICD-10-CM

## 2023-06-11 LAB — RENAL FUNCTION PANEL
Albumin: 2.9 g/dL — ABNORMAL LOW (ref 3.5–5.0)
Anion gap: 15 (ref 5–15)
BUN: 32 mg/dL — ABNORMAL HIGH (ref 6–20)
CO2: 26 mmol/L (ref 22–32)
Calcium: 9.4 mg/dL (ref 8.9–10.3)
Chloride: 94 mmol/L — ABNORMAL LOW (ref 98–111)
Creatinine, Ser: 7.03 mg/dL — ABNORMAL HIGH (ref 0.61–1.24)
GFR, Estimated: 9 mL/min — ABNORMAL LOW (ref >60)
Glucose, Bld: 120 mg/dL — ABNORMAL HIGH (ref 70–99)
Phosphorus: 4.3 mg/dL (ref 2.5–4.6)
Potassium: 4 mmol/L (ref 3.5–5.1)
Sodium: 135 mmol/L (ref 135–145)

## 2023-06-11 LAB — CBC WITH DIFFERENTIAL/PLATELET
Abs Immature Granulocytes: 0.05 10*3/uL (ref 0.00–0.07)
Basophils Absolute: 0 10*3/uL (ref 0.0–0.1)
Basophils Relative: 0 %
Eosinophils Absolute: 0.2 10*3/uL (ref 0.0–0.5)
Eosinophils Relative: 2 %
HCT: 30.4 % — ABNORMAL LOW (ref 39.0–52.0)
Hemoglobin: 10.1 g/dL — ABNORMAL LOW (ref 13.0–17.0)
Immature Granulocytes: 1 %
Lymphocytes Relative: 23 %
Lymphs Abs: 2.1 10*3/uL (ref 0.7–4.0)
MCH: 31.8 pg (ref 26.0–34.0)
MCHC: 33.2 g/dL (ref 30.0–36.0)
MCV: 95.6 fL (ref 80.0–100.0)
Monocytes Absolute: 0.6 10*3/uL (ref 0.1–1.0)
Monocytes Relative: 6 %
Neutro Abs: 6.1 10*3/uL (ref 1.7–7.7)
Neutrophils Relative %: 68 %
Platelets: 329 10*3/uL (ref 150–400)
RBC: 3.18 MIL/uL — ABNORMAL LOW (ref 4.22–5.81)
RDW: 14.1 % (ref 11.5–15.5)
WBC: 9 10*3/uL (ref 4.0–10.5)
nRBC: 0 % (ref 0.0–0.2)

## 2023-06-11 LAB — ECHOCARDIOGRAM COMPLETE
Area-P 1/2: 3.28 cm2
Height: 67 in
S' Lateral: 3.2 cm
Weight: 3536 [oz_av]

## 2023-06-11 LAB — TROPONIN I (HIGH SENSITIVITY): Troponin I (High Sensitivity): 49 ng/L — ABNORMAL HIGH (ref <18)

## 2023-06-11 MED ORDER — CINACALCET HCL 30 MG PO TABS
180.0000 mg | ORAL_TABLET | ORAL | Status: DC
  Administered 2023-06-12: 180 mg via ORAL
  Filled 2023-06-11: qty 6

## 2023-06-11 MED ORDER — DEXTROMETHORPHAN POLISTIREX ER 30 MG/5ML PO SUER: 30.0000 mg | Freq: Two times a day (BID) | ORAL | Status: DC | PRN

## 2023-06-11 MED ORDER — AZITHROMYCIN 500 MG PO TABS
500.0000 mg | ORAL_TABLET | Freq: Every day | ORAL | Status: DC
  Administered 2023-06-11 – 2023-06-12 (×2): 500 mg via ORAL
  Filled 2023-06-11 (×2): qty 1

## 2023-06-11 MED ORDER — GABAPENTIN 300 MG PO CAPS
300.0000 mg | ORAL_CAPSULE | Freq: Every day | ORAL | Status: DC
  Administered 2023-06-11 – 2023-06-13 (×3): 300 mg via ORAL
  Filled 2023-06-11 (×3): qty 1

## 2023-06-11 MED ORDER — DOXERCALCIFEROL 4 MCG/2ML IV SOLN
6.0000 ug | INTRAVENOUS | Status: DC
  Administered 2023-06-12: 6 ug via INTRAVENOUS
  Filled 2023-06-11 (×2): qty 4

## 2023-06-11 MED ORDER — CHLORHEXIDINE GLUCONATE CLOTH 2 % EX PADS: 6.0000 | MEDICATED_PAD | Freq: Every day | CUTANEOUS | Status: DC

## 2023-06-11 NOTE — Plan of Care (Signed)

## 2023-06-11 NOTE — Progress Notes (Signed)
 Patient was transported off the floor to dialysis unit at this time

## 2023-06-11 NOTE — Plan of Care (Cosign Needed)
 FMTS Interim Progress Note  S: Notified by nurse Michaelle Copas via securechat that pt had experienced shortness of breath after attempting to wean him to 1L O2 via [place]. She reports O2 sat of 97% at that time. Went to examine pt with Dr. Elliot Gurney. Found patient sitting up on the side of the bed, swaying slightly. Mr. Raybourn reported that he started to experience increased shortness of breath, dizziness (described as feeling light headed) and L ear pain (inside his ear) shortly after his O2 supplementation was decreased. He initially denied chest pain, but then reported he had some chest tightness prior to his other symptoms starting. No N/V/abdominal pain. Pt does report similar episodes have occurred previously at home where his O2 sat will be fine but he will feel short of breath.  O: BP (!) 155/98   Pulse 73   Temp 98.2 F (36.8 C) (Oral)   Resp 20   Ht 5\' 7"  (1.702 m)   Wt 100.4 kg   SpO2 96%   BMI 34.66 kg/m   General: Pt seated on side of bed, swaying slightly. No acute distress. Respiratory: No increased effort of breathing. Lungs clear to auscultation bilaterally. O2 satting 100% while in room with pt on 1L O2. Neuro: Pt responding appropriately to questions. Appears sleepy.  A/P: Shortness of Breath Considered worsening of acute respiratory failure, though normal O2 sat reassuring. Considered cardiac etiology given sudden onset and symptom description. Considered new onset viral illness, though sudden onset of symptoms less likely. Considered anxiety component of symptoms, though will rule out other more urgent causes first. - Pt instructed to lay back in bed given dizziness - O2 supplementation increased back to 2L for patient comfort - CXR ordered to evaluate lungs; consider Chest CT pending CXR findings - EKG, troponin ordered to rule out cardiac cause of acute events - Consider repeat RPP vs Covid/flu testing if symptoms don't improve  Governor Rooks, Medical Student 06/11/2023,  1:22 PM  Service pager [phone]

## 2023-06-11 NOTE — Progress Notes (Addendum)
 PT Cancellation Note  Patient Details Name: [name] MRN: [id number] DOB: [birth year]   Cancelled Treatment:    Reason Eval/Treat Not Completed: Fatigue/lethargy limiting ability to participate (pt just worked with OT able to get to EOB but too fatigued for futher activity or participation with P.T. at this time)   2nd Attempt pt reports too fatigued from late HD and unable to participate  [name] 06/11/2023, 8:59 AM Merryl Hacker, PT Acute Rehabilitation Services Office: [phone]

## 2023-06-11 NOTE — Progress Notes (Addendum)
 Daily Progress Note Intern Pager: [phone]  Patient name: [name] Medical record number: [id number] Date of birth: Aug 19, 1965 Age: 58 y.o. Gender: male  Primary Care Provider: Vladimir Crofts, FNP Consultants: Pulmonology, Nephrology Code Status: Full  Pt Overview and Major Events to Date:  - 3/3: Admitted - 3/4 (0100): Received hemodialysis  Assessment and Plan:  [name] is a 58 y.o. male with PMHx significant for ESRD on dialysis, paroxysmal atrial fibrillation, HFpEF, T2DM, HTN presenting with shortness of breath. Assessment & Plan Acute hypoxic respiratory failure (HCC) Pt presented 3/3 with hypoxemia and acute on chronic cough requiring 2L O2 supplementation via [place]; was increased to 3L overnight on 3/3 temporarily. Currently on 2L via [place]. CXR on 3/3 suggestive of extra fluid, also with questionable RLL pneumonia. Negative RPP. Pt started on empiric azithromycin, ceftriaxone. No known COPD/asthma, but pt on Breztri and albuterol nebulizer at home with reported improvement in dyspnea with use; was switched to Brovana, Pulmicort, Yupelri by pulm until able to take deep breaths and hold. On phenergan for cough suppressant. Appreciate continued pulm recommendations. Dyspnea improved some following dialysis, still with continued cough. PT and OT following; recommending home health. Consider OSA versus GERD treatment if cough continues without improvement on current regimen. - Will start pt on Dextromethorphan 30 mg BID prn for continued cough - Continue O2 supplementation with 2L via [place]; wean as tolerated - Continue Azithromycin 500 mg daily for 5 doses (started IV 3/3, transitioned to oral 3/4-3/7) and Ceftriaxone 2g daily for 5 doses (started IV 3/3-3/7) - Continue Brovana, Pulmicort, Yupelri for breathing treatments; once pt able to take deep breaths and hold, can switch back to [employer] and albuterol - Continue phenergan 12.5 mg q6h - Pulmonology consulting; consider Chest  CT based on pulm's recommendations - PT/OT consulting - No indication for cardiology consult at this time - Repeat CBC tomorrow am ESRD (end stage renal disease) (HCC) Pt with ESRD on MWF dialysis. Missed dialysis 3/3 but otherwise reports compliance. Got dialysis around 0100 on 3/4 with 3.5L taken off. Cr much improved from 13.4 yesterday to 7.03 today. Weights over past year between 95-100kg; difficult to discern dry weight. Appreciate continued nephro recommendations. - Getting daily weights - Nephrology consulting; will likely get ultrafiltration tomorrow (3/5) during dialysis. - Have resumed HD on MWF - Continue binders: Reva-Vit, Rocaltrol Chronic heart failure with preserved ejection fraction (HCC) Pt with history of same with last EF of 60-65%, Grade I diastolic dysfunction on echo done 02/2023. Repeat echo done 3/4 showed LVEF 60-65% with Grade II diastolic dysfunction, severe concentric left ventricular hypertrophy. - No indication for cardiology consult at this time - Will follow-up with cardiology at discharge Severe uncontrolled hypertension Pt with BP as high as 231/130 on admission 3/3, had not taken home BP medications that day. Known severe BP, and no evidence of end organ damage. BP today is 169/89, improved from yesterday. - Continue carvedilol 25 mg BID, hydralazine 50 mg TID, amlodipine 10 mg daily AF (paroxysmal atrial fibrillation) (HCC) Pt with history of A fib, previously on Eliquis but not for the past couple years for unknown reason. Given uncertainty, will not restart Eliquis at this time. Pt remains in NSR at this time. - Pt to continue carvedilol 25 mg BID for rate control - No indication for cardiology consult at this time - Will follow-up with cardiology at discharge to discuss restarting Eliquis DMII (diabetes mellitus, type 2) (HCC) Pt managed with diet control. On gabapentin  for neuropathy. - Will continue gabapentin 300 mg daily   Chronic and Stable  Issues: Anemia: Likely ESRD, stable at baseline. Inhalational exposures from working in [place] immedi[place]ly post-9/11 (pt reports he has "lung nodules" from this  FEN/GI: No indication for IV fluids; renal diet with fluid restriction of 2000 mL  PPx: Heparin Dispo:Home in 2-3 days. Barriers include clinical improvement.   Subjective:  Overnight, [name] experienced increased shortness of breath and was increased from 2L to 3L O2 supplementation via [place]. He then underwent dialysis around 0100 on 3/4 with removal of 3.5L. O2 later decreased back to 2L via [place] around 0600.  This morning, he reports he is doing okay overall. He reports his shortness of breath is overall improved, though does still come back when he coughs a lot. He reports he is coughing about the same, though he seems to be coughing less frequently during our interview compared to yesterday. He reports the cough syrup helped his cough a little. He denies current headache, N/V, chest pain. Reports cough worsens when he lies flat or lies on his left side and so has been propping himself up. No other concerns.  Objective: Temp:  [97.7 F (36.5 C)-98.8 F (37.1 C)] 97.9 F (36.6 C) (03/04 0607) Pulse Rate:  [70-95] 82 (03/04 0814) Resp:  [16-23] 20 (03/04 0814) BP: (140-231)/(77-130) 169/89 (03/04 0814) SpO2:  [92 %-100 %] 96 % (03/04 0814) Weight:  [100.2 kg] 100.2 kg (03/03 1103) Physical Exam: General: Pt is laying on R side in bed with head of bed elevated, no acute distress. Cardiovascular: Regular rate and rhythm, no murmurs/rubs/gallops. Respiratory: Normal work of breathing. Clear to auscultation bilaterally. Occasional cough. Abdomen: Normal bowel sounds. Soft, obese. Minimal tenderness to palpation diffusely. No rebound tenderness, no guarding. Extremities: None to minimal edema of bilateral ankles. Normal gross ROM of all 4 extremities.  Laboratory: Most recent CBC Lab Results  Component Value Date   WBC 9.0 06/11/2023    HGB 10.1 (L) 06/11/2023   HCT 30.4 (L) 06/11/2023   MCV 95.6 06/11/2023   PLT 329 06/11/2023   Most recent BMP    Latest Ref Rng & Units 06/11/2023    7:15 AM  BMP  Glucose 70 - 99 mg/dL 914   BUN 6 - 20 mg/dL 32   Creatinine 7.82 - 1.24 mg/dL 9.56   Sodium 213 - 086 mmol/L 135   Potassium 3.5 - 5.1 mmol/L 4.0   Chloride 98 - 111 mmol/L 94   CO2 22 - 32 mmol/L 26   Calcium 8.9 - 10.3 mg/dL 9.4     Other pertinent labs: Procalcitonin 0.42 RPP: Negative   Imaging/Diagnostic Tests: Echo 3/4:  1. Left ventricular ejection fraction, by estimation, is 60 to 65%. The left ventricle has normal function. The left ventricle has no regional wall motion abnormalities. There is severe concentric left ventricular hypertrophy. Left ventricular diastolic parameters are consistent with Grade II diastolic dysfunction  (pseudonormalization). The average left ventricular global longitudinal strain is -19.4 %. The global longitudinal strain is normal.   2. Right ventricular systolic function is normal. The right ventricular size is mildly enlarged. There is normal pulmonary artery systolic pressure. The estimated right ventricular systolic pressure is 33.5 mmHg.   3. Left atrial size was mildly dilated.   4. The mitral valve is degenerative. Mild mitral valve regurgitation. No evidence of mitral stenosis. Moderate mitral annular calcification.   5. The aortic valve is tricuspid. Aortic valve regurgitation is not visualized. No  aortic stenosis is present.   6. There is mild dilatation of the ascending aorta, measuring 41 mm.   7. The inferior vena cava is normal in size with greater than 50% respiratory variability, suggesting right atrial pressure of 3 mmHg.   Governor Rooks, Medical Student 06/11/2023, 8:55 AM   Reviewed Medical student nota and in agreement with assessment and plan.  Jerre Simon, MD PGY-3, [facility] Resident  Please page [phone] with questions.    FPTS  Intern pager: [phone], text pages welcome Secure chat group [facility] Teaching Service

## 2023-06-11 NOTE — Progress Notes (Signed)
 [facility] Progress Note  Subjective:    Vitals:   06/11/23 0814 06/11/23 1002 06/11/23 1003 06/11/23 1007  BP: (!) 169/89     Pulse: 82     Resp: 20     Temp:      TempSrc:      SpO2: 96% 93% 93% 99%  Weight:      Height:        Exam: Gen alert, lying flat, sleeping , aroused briefly, no c/o No jvd or bruits Chest no rales/ rhonchi, CTA bilat post  RRR no MRG Abd soft ntnd no mass or ascites +bs Ext no LE edema Neuro is alert, Ox 3 , nf    L AVF +bruit     Renal-related home meds: - norvasc 10 - coreg 25 bid - renavite - renvela 3 ac - hydralazine 50 tid - rocaltrol mwf 0.25 mcg - xphozah 30 bid      OP HD: SW MWF 4h  B400   95.5kg  2K bath  AVF  Heparin 5000 + 2000 midrun   - coming off 4-7 kg over the last 3 wks - missed HD last Friday 2/28 - hectorol 6 mcg  - sensipar 180 mg three times per week    Assessment/ Plan: Acute hypoxic resp failure - on [place] O2. Long hx of pulm edema looking back at the last year of CXR's. Consider non volume-related possibilities as well. Got 3.5 L off last night w/ no drop in BP's. Was on 3L [place], today is on 2 L [place], lying flat and not coughing. Suspect this is all volume related (chronic cough and orthopnea). Cont to lower vol w/ HD tomorrow.  ESRD - on HD MWF. Missed 2/28 dialysis. Had HD here 3/03. Next HD as above.   HTN - BP 210/110 in ED. Better today 169/89. Cont home meds.  Volume - suspect vol overload, cont to lower w/ HD as tolerated.  Anemia of esrd - Hb 11-12 here, follow.  Secondary hyperparathyroidism - CCa borderline high, phos is in range. Binders w/ meals. IV vdra and sensipar.  Atrial fib - per pmd DM2        Rob Arlean Hopping MD  [facility] 06/11/2023, 10:21 AM  Recent Labs  Lab 06/10/23 1142 06/10/23 1153 06/10/23 1158 06/11/23 0715  HGB 11.0*   < > 11.9* 10.1*  ALBUMIN 3.4*  --   --  2.9*  CALCIUM 10.2  --   --  9.4  PHOS  --   --   --  4.3  CREATININE 12.30*  --  13.40* 7.03*  K 4.9    < > 4.9 4.0   < > = values in this interval not displayed.   No results for input(s): "IRON", "TIBC", "FERRITIN" in the last 168 hours. Inpatient medications:  amLODipine  10 mg Oral Daily   arformoterol  15 mcg Nebulization BID   budesonide (PULMICORT) nebulizer solution  0.25 mg Nebulization BID   carvedilol  25 mg Oral BID WC   Chlorhexidine Gluconate Cloth  6 each Topical Q0600   heparin  5,000 Units Subcutaneous Q8H   hydrALAZINE  50 mg Oral TID   multivitamin  1 tablet Oral QHS   promethazine  12.5 mg Oral Q6H   revefenacin  175 mcg Nebulization Daily   sevelamer carbonate  2,400 mg Oral TID WC   Vitamin D (Ergocalciferol)  50,000 Units Oral Q M,W,F    azithromycin 500 mg (06/10/23 2151)   cefTRIAXone (ROCEPHIN)  IV 2 g (06/10/23 1847)   acetaminophen **OR** acetaminophen, heparin sodium (porcine), heparin sodium (porcine)

## 2023-06-11 NOTE — Assessment & Plan Note (Addendum)
 Pt with history of A fib, previously on Eliquis but not for the past couple years for unknown reason. Given uncertainty, will not restart Eliquis at this time. Pt remains in NSR at this time. - Pt to continue carvedilol 25 mg BID for rate control - No indication for cardiology consult at this time - Will follow-up with cardiology at discharge to discuss restarting Eliquis

## 2023-06-11 NOTE — Assessment & Plan Note (Addendum)
 Pt with ESRD on MWF dialysis. Missed dialysis 3/3 but otherwise reports compliance. Got dialysis around 0100 on 3/4 with 3.5L taken off. Cr much improved from 13.4 yesterday to 7.03 today. Weights over past year between 95-100kg; difficult to discern dry weight. Appreciate continued nephro recommendations. - Getting daily weights - Nephrology consulting; will likely get ultrafiltration tomorrow (3/5) during dialysis. - Have resumed HD on MWF - Continue binders: Reva-Vit, Rocaltrol

## 2023-06-11 NOTE — Assessment & Plan Note (Addendum)
 Pt managed with diet control. On gabapentin for neuropathy. - Will continue gabapentin 300 mg daily

## 2023-06-11 NOTE — Assessment & Plan Note (Addendum)
 Pt with history of same with last EF of 60-65%, Grade I diastolic dysfunction on echo done 02/2023. Repeat echo done 3/4 showed LVEF 60-65% with Grade II diastolic dysfunction, severe concentric left ventricular hypertrophy. - No indication for cardiology consult at this time - Will follow-up with cardiology at discharge

## 2023-06-11 NOTE — Evaluation (Signed)
 Occupational Therapy Evaluation Patient Details Name: [name] MRN: [id number] DOB: 1965-11-15 Today's Date: 06/11/2023   History of Present Illness   Pt is a 58 y/o gentleman admitted 3/3 with cough and SOB, RLL PNA. PMHx: ESRD on HD MWF, inhalational exposures during 9/11 with chronic asthma, T2DM, OSA, AFib, HTN, CHF     Clinical Impressions Pt reported at PLOF they completed limited activity and they will have wife or family assist as needed. Pt reported they are feeling very tired at this time and agreed to complete just EOB activity at this time. Pt completed sit to stand transfers with CGA and laterally step to [facility] with CGA. He required set up for UE and CGA for LE ADLS while sitting at EOB. Acute Occupational Therapy will continue to follow and recommendation for [facility] therapies with the transition to home with family support.      If plan is discharge home, recommend the following:   Assistance with cooking/housework;Assist for transportation;Help with stairs or ramp for entrance;A little help with bathing/dressing/bathroom     Functional Status Assessment   Patient has had a recent decline in their functional status and demonstrates the ability to make significant improvements in function in a reasonable and predictable amount of time.     Equipment Recommendations   None recommended by OT     Recommendations for Other Services         Precautions/Restrictions   Precautions Precautions: Fall Recall of Precautions/Restrictions: Intact Restrictions Weight Bearing Restrictions Per Provider Order: No Other Position/Activity Restrictions: watch o2/BP     Mobility Bed Mobility Overal bed mobility: Needs Assistance Bed Mobility: Supine to Sit     Supine to sit: Modified independent (Device/Increase time)          Transfers Overall transfer level: Needs assistance Equipment used: Rolling walker (2 wheels) Transfers: Sit to/from Stand Sit to Stand:  Contact guard assist                  Balance Overall balance assessment: Needs assistance Sitting-balance support: Feet supported Sitting balance-Leahy Scale: Good     Standing balance support: No upper extremity supported Standing balance-Leahy Scale: Fair                             ADL either performed or assessed with clinical judgement   ADL Overall ADL's : Needs assistance/impaired Eating/Feeding: Independent;Sitting   Grooming: Wash/dry hands;Wash/dry face;Set up;Sitting   Upper Body Bathing: Set up;Sitting   Lower Body Bathing: Contact guard assist;Sit to/from stand;Sitting/lateral leans   Upper Body Dressing : Set up;Sitting   Lower Body Dressing: Contact guard assist;Sit to/from stand   Toilet Transfer: Cueing for safety;Cueing for sequencing;Contact guard assist   Toileting- Clothing Manipulation and Hygiene: Contact guard assist;Sit to/from stand       Functional mobility during ADLs: Contact guard assist;Cueing for safety;Cueing for sequencing       Vision Baseline Vision/History: 0 No visual deficits Ability to See in Adequate Light: 0 Adequate Patient Visual Report: No change from baseline Vision Assessment?: No apparent visual deficits     Perception Perception: Within Functional Limits       Praxis Praxis: WFL       Pertinent Vitals/Pain Pain Assessment Pain Assessment: Faces Faces Pain Scale: Hurts little more Pain Location: L side due to coughing Pain Descriptors / Indicators: Discomfort Pain Intervention(s): Limited activity within patient's tolerance, Monitored during session, Repositioned     Extremity/Trunk  Assessment Upper Extremity Assessment Upper Extremity Assessment: Overall WFL for tasks assessed   Lower Extremity Assessment Lower Extremity Assessment: Defer to PT evaluation   Cervical / Trunk Assessment Cervical / Trunk Assessment: Normal   Communication Communication Communication: No apparent  difficulties   Cognition Arousal: Lethargic Behavior During Therapy: WFL for tasks assessed/performed Cognition: No apparent impairments             OT - Cognition Comments: Pt reported feeling very fatigued in session due to timing of HD                 Following commands: Intact       Cueing  General Comments   Cueing Techniques: Verbal cues      Exercises     Shoulder Instructions      Home Living Family/patient expects to be discharged to:: Private residence Living Arrangements: Spouse/significant other Available Help at Discharge: Family;Available PRN/intermittently Type of Home: House Home Access: Level entry     Home Layout: One level     Bathroom Shower/Tub: Producer, television/film/video: Standard Bathroom Accessibility: Yes How Accessible: Accessible via walker Home Equipment: Shower seat;Rolling Walker (2 wheels);Cane - single point;Grab bars - tub/shower;Hand held shower head;Wheelchair - manual          Prior Functioning/Environment Prior Level of Function : Independent/Modified Independent;Needs assist       Physical Assist : Mobility (physical);ADLs (physical) Mobility (physical): Gait   Mobility Comments: Pt reported deping on how weak they are after HD if they need some assist from family ADLs Comments: spouse assisted as needed    OT Problem List: Decreased strength;Decreased activity tolerance;Impaired balance (sitting and/or standing);Cardiopulmonary status limiting activity   OT Treatment/Interventions: Self-care/ADL training;Therapeutic exercise;DME and/or AE instruction;Therapeutic activities;Patient/family education;Balance training      OT Goals(Current goals can be found in the care plan section)   Acute Rehab OT Goals Patient Stated Goal: to rest OT Goal Formulation: With patient Time For Goal Achievement: 06/25/23 Potential to Achieve Goals: Good   OT Frequency:  Min 1X/week    Co-evaluation               AM-PAC OT "6 Clicks" Daily Activity     Outcome Measure Help from another person eating meals?: None Help from another person taking care of personal grooming?: A Little Help from another person toileting, which includes using toliet, bedpan, or urinal?: A Little Help from another person bathing (including washing, rinsing, drying)?: A Little Help from another person to put on and taking off regular upper body clothing?: A Little Help from another person to put on and taking off regular lower body clothing?: A Little 6 Click Score: 19   End of Session Equipment Utilized During Treatment: Gait belt Nurse Communication: Mobility status  Activity Tolerance: Patient limited by fatigue Patient left: in bed;with call bell/phone within reach;with nursing/sitter in room  OT Visit Diagnosis: Unsteadiness on feet (R26.81);Other abnormalities of gait and mobility (R26.89);Repeated falls (R29.6);Muscle weakness (generalized) (M62.81)                Time: 9604-5409 OT Time Calculation (min): 21 min Charges:  OT General Charges $OT Visit: 1 Visit OT Evaluation $OT Eval Low Complexity: 1 Low  Presley Raddle OTR/L  Acute Rehab Services  [phone] office number   Alphia Moh 06/11/2023, 10:38 AM

## 2023-06-11 NOTE — Plan of Care (Signed)
 FMTS Brief Progress Note  S:Evaluated pt at bedside after noticing increased O2 requirement from 2>3L. Per RN, pt requested his O2 increased because he felt short of breath. On my evaluation pt states his shortness of breath is slightly worse. No other concerns at this time.    O: BP (!) 164/125 (BP Location: Right Wrist)   Pulse 70   Temp 98 F (36.7 C) (Oral)   Resp 19   Ht 5\' 7"  (1.702 m)   Wt 100.2 kg   SpO2 100%   BMI 34.61 kg/m   Gen: no acute distress, sleeping upon entrance to room CV: RRR, no m/r/g Resp: unchanged from my prior exam. Speaks in complete sentences. Saturating 100% on 3LNC Neuro: A&Ox4. Easily arousable to voice.   A/P: Plan unchanged from prior Next in line for dialysis, which should help his BP and volume status RN advised to page if he clinically worsens  Para March, DO 06/11/2023, 12:43 AM PGY-1, [facility] Night Resident  Please page [phone] with questions.

## 2023-06-11 NOTE — Progress Notes (Signed)
 Completed 3.5 hours of dialysis. Tolerated net fluid removal of 3500 ml.. VS stable. No significant events. LUA AVF de accessed x2 needles. Manual pressure applied to sites until hemostasis was achieved. Sites secured with gauze/taped. Patient transported back in his room in a stable condition. Handoff report to Lawana Chambers

## 2023-06-11 NOTE — Assessment & Plan Note (Addendum)
 Pt with BP as high as 231/130 on admission 3/3, had not taken home BP medications that day. Known severe BP, and no evidence of end organ damage. BP today is 169/89, improved from yesterday. - Continue carvedilol 25 mg BID, hydralazine 50 mg TID, amlodipine 10 mg daily

## 2023-06-11 NOTE — Plan of Care (Signed)
 FMTS Brief Progress Note  S: Evaluated with Dr. Velna Ochs.  States his chest pain has improved and has no shortness of breath currently.   O: BP (!) 150/95 (BP Location: Right Wrist)   Pulse 69   Temp 98.6 F (37 C) (Oral)   Resp 19   Ht 5\' 7"  (1.702 m)   Wt 100.4 kg   SpO2 100%   BMI 34.66 kg/m   Gen: well appearing, no acute distress CV: RRR, no m/r/g Pulm: Breathing comfortably on 2LNC, mild coarse breath sounds bilaterally  A/P: Shortness of breath Stable - pulmonology, nephrology following - advised to let us know if his shortness of breath worsens in any way or he develops chest pain again - rest of plan per day team  - Orders reviewed. Labs for AM ordered, which was adjusted as needed.  - If condition changes, plan includes V/Q scan/further SOB workup.   Masiah Lewing, DO 06/11/2023, 9:02 PM PGY-1, [facility] Night Resident  Please page [phone] with questions.

## 2023-06-11 NOTE — Assessment & Plan Note (Addendum)
 Pt presented 3/3 with hypoxemia and acute on chronic cough requiring 2L O2 supplementation via [place]; was increased to 3L overnight on 3/3 temporarily. Currently on 2L via [place]. CXR on 3/3 suggestive of extra fluid, also with questionable RLL pneumonia. Negative RPP. Pt started on empiric azithromycin, ceftriaxone. No known COPD/asthma, but pt on Breztri and albuterol nebulizer at home with reported improvement in dyspnea with use; was switched to Brovana, Pulmicort, Yupelri by pulm until able to take deep breaths and hold. On phenergan for cough suppressant. Appreciate continued pulm recommendations. Dyspnea improved some following dialysis, still with continued cough. PT and OT following; recommending home health. Consider OSA versus GERD treatment if cough continues without improvement on current regimen. - Will start pt on Dextromethorphan 30 mg BID prn for continued cough - Continue O2 supplementation with 2L via [place]; wean as tolerated - Continue Azithromycin 500 mg daily for 5 doses (started IV 3/3, transitioned to oral 3/4-3/7) and Ceftriaxone 2g daily for 5 doses (started IV 3/3-3/7) - Continue Brovana, Pulmicort, Yupelri for breathing treatments; once pt able to take deep breaths and hold, can switch back to [employer] and albuterol - Continue phenergan 12.5 mg q6h - Pulmonology consulting; consider Chest CT based on pulm's recommendations - PT/OT consulting - No indication for cardiology consult at this time - Repeat CBC tomorrow am

## 2023-06-12 DIAGNOSIS — J9601 Acute respiratory failure with hypoxia: Secondary | ICD-10-CM | POA: Diagnosis not present

## 2023-06-12 LAB — RENAL FUNCTION PANEL
Albumin: 2.7 g/dL — ABNORMAL LOW (ref 3.5–5.0)
Anion gap: 13 (ref 5–15)
BUN: 46 mg/dL — ABNORMAL HIGH (ref 6–20)
CO2: 27 mmol/L (ref 22–32)
Calcium: 9.7 mg/dL (ref 8.9–10.3)
Chloride: 93 mmol/L — ABNORMAL LOW (ref 98–111)
Creatinine, Ser: 9.74 mg/dL — ABNORMAL HIGH (ref 0.61–1.24)
GFR, Estimated: 6 mL/min — ABNORMAL LOW (ref >60)
Glucose, Bld: 113 mg/dL — ABNORMAL HIGH (ref 70–99)
Phosphorus: 7.6 mg/dL — ABNORMAL HIGH (ref 2.5–4.6)
Potassium: 4.6 mmol/L (ref 3.5–5.1)
Sodium: 133 mmol/L — ABNORMAL LOW (ref 135–145)

## 2023-06-12 LAB — CBC
HCT: 30.3 % — ABNORMAL LOW (ref 39.0–52.0)
Hemoglobin: 9.9 g/dL — ABNORMAL LOW (ref 13.0–17.0)
MCH: 31.6 pg (ref 26.0–34.0)
MCHC: 32.7 g/dL (ref 30.0–36.0)
MCV: 96.8 fL (ref 80.0–100.0)
Platelets: 330 10*3/uL (ref 150–400)
RBC: 3.13 MIL/uL — ABNORMAL LOW (ref 4.22–5.81)
RDW: 13.9 % (ref 11.5–15.5)
WBC: 7.6 10*3/uL (ref 4.0–10.5)
nRBC: 0 % (ref 0.0–0.2)

## 2023-06-12 LAB — HEPATITIS B SURFACE ANTIBODY, QUANTITATIVE: Hep B S AB Quant (Post): 1139 m[IU]/mL

## 2023-06-12 MED ORDER — FAMOTIDINE 20 MG PO TABS
10.0000 mg | ORAL_TABLET | ORAL | Status: DC
  Administered 2023-06-12: 10 mg via ORAL
  Filled 2023-06-12 (×2): qty 1

## 2023-06-12 MED ORDER — LIDOCAINE-PRILOCAINE 2.5-2.5 % EX CREA: 1.0000 | TOPICAL_CREAM | CUTANEOUS | Status: DC | PRN

## 2023-06-12 MED ORDER — NEPRO/CARBSTEADY PO LIQD: 237.0000 mL | ORAL | Status: DC | PRN

## 2023-06-12 MED ORDER — AMOXICILLIN-POT CLAVULANATE 500-125 MG PO TABS
1.0000 | ORAL_TABLET | Freq: Every day | ORAL | Status: DC
  Administered 2023-06-12: 1 via ORAL
  Filled 2023-06-12 (×2): qty 1

## 2023-06-12 MED ORDER — ANTICOAGULANT SODIUM CITRATE 4% (200MG/5ML) IV SOLN: 5.0000 mL | Status: DC | PRN

## 2023-06-12 MED ORDER — FAMOTIDINE 10 MG PO TABS
10.0000 mg | ORAL_TABLET | ORAL | Status: DC
  Filled 2023-06-12: qty 1

## 2023-06-12 MED ORDER — HEPARIN SODIUM (PORCINE) 1000 UNIT/ML DIALYSIS
2000.0000 [IU] | INTRAMUSCULAR | Status: DC | PRN
  Filled 2023-06-12: qty 2

## 2023-06-12 MED ORDER — HEPARIN SODIUM (PORCINE) 1000 UNIT/ML DIALYSIS: 1000.0000 [IU] | INTRAMUSCULAR | Status: DC | PRN

## 2023-06-12 MED ORDER — HEPARIN SODIUM (PORCINE) 1000 UNIT/ML DIALYSIS
5000.0000 [IU] | Freq: Once | INTRAMUSCULAR | Status: DC
  Filled 2023-06-12: qty 5

## 2023-06-12 MED ORDER — PENTAFLUOROPROP-TETRAFLUOROETH EX AERO: 1.0000 | INHALATION_SPRAY | CUTANEOUS | Status: DC | PRN

## 2023-06-12 MED ORDER — LIDOCAINE HCL (PF) 1 % IJ SOLN: 5.0000 mL | INTRAMUSCULAR | Status: DC | PRN

## 2023-06-12 MED ORDER — ALTEPLASE 2 MG IJ SOLR: 2.0000 mg | Freq: Once | INTRAMUSCULAR | Status: DC | PRN

## 2023-06-12 NOTE — Assessment & Plan Note (Addendum)
 BP today is 158/95, improved with home medications.  Appears he was not taking amlodipine at home, recommend he continue this medicine at discharge. - Continue carvedilol 25 mg BID, hydralazine 50 mg TID, amlodipine 10 mg daily

## 2023-06-12 NOTE — Assessment & Plan Note (Addendum)
 Pt managed with diet control. On gabapentin for neuropathy. - Will continue gabapentin 300 mg daily

## 2023-06-12 NOTE — Progress Notes (Signed)
Pt receives out-pt HD at [facility] [address] [place] on MWF. Will assist as needed.   Olivia Canter Renal Navigator [phone]

## 2023-06-12 NOTE — Assessment & Plan Note (Deleted)
 Pt with history of same with last EF of 60-65%, Grade I diastolic dysfunction on echo done 02/2023. Repeat echo done 3/4 showed LVEF 60-65% with Grade II diastolic dysfunction, severe concentric left ventricular hypertrophy. - No indication for cardiology consult at this time - Will follow-up with cardiology at discharge

## 2023-06-12 NOTE — Plan of Care (Signed)

## 2023-06-12 NOTE — Assessment & Plan Note (Addendum)
 ESRD MWF dialysis. Weights over past year between 95-100kg; difficult to discern dry weight. Getting dialysis again today 3/5 with ultrafiltration. Up 3kg from admission; unsure if initial weight accurate. -Nephrology consulted, appreciate recommendations - Continue daily weights - Continue HD on MWF - Continue Reva-Vit, Rocaltrol

## 2023-06-12 NOTE — Progress Notes (Signed)
 Pt off unit to dialysis at this time.

## 2023-06-12 NOTE — Progress Notes (Signed)
 PT Cancellation Note  Patient Details Name: [name] MRN: [id number] DOB: March 16, 1966   Cancelled Treatment:    Reason Eval/Treat Not Completed: Patient at procedure or test/unavailable. Pt in HD. PT to re-attempt as time allows.   Ilda Foil 06/12/2023, 8:15 AM

## 2023-06-12 NOTE — Discharge Instructions (Signed)
 Dear [name],   Thank you for letting us participate in your care! You were admitted to the hospital for shortness of breath, cough, and high blood pressure. You were put on antibiotics for pneumonia. Your blood pressure improved with dialysis.   POST-HOSPITAL & CARE INSTRUCTIONS We recommend following up with your PCP within 1 week from being discharged from the hospital. Please let PCP/Specialists know of any changes in medications that were made which you will be able to see in the medications section of this packet. Please also follow up with pulmonology and cardiology.   DOCTOR'S APPOINTMENTS & FOLLOW UP No future appointments.   Thank you for choosing [facility]! Take care and be well!  Family Medicine Teaching Service Inpatient Team [place]  [facility]  [address] [place], [place] [address] [phone]

## 2023-06-12 NOTE — Assessment & Plan Note (Addendum)
 Pt with history of A fib, previously on Eliquis but not for the past couple years for unknown reason. Given uncertainty, will not restart Eliquis at this time. Pt remains in NSR at this time. - Pt to continue carvedilol 25 mg BID for rate control - No indication for cardiology consult at this time - Will follow-up with cardiology at discharge to discuss restarting Eliquis

## 2023-06-12 NOTE — Assessment & Plan Note (Addendum)
 On 1L LFNC, improving.  Suspect cough may have GERD component. - Pulmonology consulted, appreciate recommendations - Start Pepcid 10 mg on hemodialysis days, after dialysis - Wean O2 - Azithromycin 5 mg daily (3/3 - 3/7) - Ceftriaxone 2 g daily (3/3 - 3/4), Augmentin (3/4 - 3/7) - Continue Brovana, Pulmicort, Yupelri - Phenergan, dextromethorphan twice daily for cough - PT/OT, recommending home health - Consider VQ scan to rule out PE if SOB worsens

## 2023-06-12 NOTE — Progress Notes (Signed)
 [facility] Progress Note  Subjective:  Seen in dialysis Cough and SOB better  Vitals:   06/12/23 0500 06/12/23 0832 06/12/23 0836 06/12/23 0849  BP:  (!) 146/89  (!) 146/71  Pulse:  70  69  Resp:  20  17  Temp:  97.8 F (36.6 C)    TempSrc:  Oral    SpO2:  98%  97%  Weight: 103.2 kg  101 kg   Height:        Exam: Gen alert, lying flat on [place] O2, not coughing No jvd or bruits Chest no rales/ rhonchi, CTA bilat post  RRR no MRG Abd soft ntnd no mass or ascites +bs Ext no LE edema Neuro is alert, Ox 3 , nf    L AVF +bruit     Renal-related home meds: - norvasc 10 - coreg 25 bid - renavite - renvela 3 ac - hydralazine 50 tid - rocaltrol mwf 0.25 mcg - xphozah 30 bid      OP HD: SW MWF 4h  B400   95.5kg  2K bath  AVF  Heparin 5000 + 2000 midrun   - coming off 4-7 kg over the last 3 wks - missed HD last Friday 2/28 - hectorol 6 mcg  - sensipar 180 mg three times per week    Assessment/ Plan: Acute hypoxic resp failure - on [place] O2. Long hx of pulm edema looking back at the last year of CXR's. Got 3.5 L UF w/ HD Monday. w/ no drop in BP's. Down to 2 L [place] and not coughing (coughing spells were relentless on admission). Suspect volume related (chronic cough and orthopnea). Cont to lower vol w/ HD today.  ESRD - on HD MWF. HD here Monday. Next HD today.  HTN - BP 210/110 in ED. Better w/ meds and HD.  Volume - has not been close to dry wt in 3 wks, coming off 3-5 kg up usually. IDWG too high most likely.  Anemia of esrd - Hb 11-12 here, follow.  Secondary hyperparathyroidism - CCa borderline high, phos is in range. Binders w/ meals. IV vdra and sensipar.  Atrial fib - per pmd DM2        Vinson Moselle MD  [facility] 06/12/2023, 9:35 AM  Recent Labs  Lab 06/11/23 0715 06/12/23 0732  HGB 10.1* 9.9*  ALBUMIN 2.9* 2.7*  CALCIUM 9.4 9.7  PHOS 4.3 7.6*  CREATININE 7.03* 9.74*  K 4.0 4.6   No results for input(s): "IRON", "TIBC", "FERRITIN" in the  last 168 hours. Inpatient medications:  amLODipine  10 mg Oral Daily   arformoterol  15 mcg Nebulization BID   azithromycin  500 mg Oral Daily   budesonide (PULMICORT) nebulizer solution  0.25 mg Nebulization BID   carvedilol  25 mg Oral BID WC   Chlorhexidine Gluconate Cloth  6 each Topical Q0600   cinacalcet  180 mg Oral Q M,W,F-1800   doxercalciferol  6 mcg Intravenous Q M,W,F-HD   famotidine  10 mg Oral QODAY   gabapentin  300 mg Oral Daily   heparin  5,000 Units Subcutaneous Q8H   [START ON 06/13/2023] heparin  5,000 Units Dialysis Once in dialysis   hydrALAZINE  50 mg Oral TID   multivitamin  1 tablet Oral QHS   promethazine  12.5 mg Oral Q6H   revefenacin  175 mcg Nebulization Daily   sevelamer carbonate  2,400 mg Oral TID WC   Vitamin D (Ergocalciferol)  50,000 Units Oral Q M,W,F  anticoagulant sodium citrate     cefTRIAXone (ROCEPHIN)  IV 2 g (06/11/23 1744)   acetaminophen **OR** acetaminophen, alteplase, anticoagulant sodium citrate, dextromethorphan, feeding supplement (NEPRO CARB STEADY), heparin, [START ON 06/13/2023] heparin, heparin sodium (porcine), heparin sodium (porcine), lidocaine (PF), lidocaine-prilocaine, pentafluoroprop-tetrafluoroeth

## 2023-06-12 NOTE — Progress Notes (Addendum)
 Daily Progress Note Intern Pager: [phone]  Patient name: [name] Medical record number: [id number] Date of birth: 1965/04/21 Age: 58 y.o. Gender: male  Primary Care Provider: Vladimir Crofts, FNP Consultants: Pulmonology, Nephrology Code Status: Full  Pt Overview and Major Events to Date:  - 3/3: Admitted - 3/4 (0100): Received hemodialysis - 3/5: Hemodialysis (to be done)  Assessment and Plan:  [name] is a 58 y.o. male with PMHx significant for ESRD on dialysis, paroxysmal atrial fibrillation, HFpEF, T2DM, HTN presenting with shortness of breath.  Suspect multifactorial including: Pneumonia, hypervolemia 2/2 ESRD with missed dialysis (resolving), possible asthma versus restrictive lung disease, anxiety and GERD.  Patient continues to improve with antibiotics, dialysis and use of inhalers.  Would benefit from outpatient pulmonology follow-up.  Anticipate home with home health once patient is on oral medications and weaned to room air (consider home with O2 if necessary). Assessment & Plan Acute hypoxic respiratory failure (HCC) On 1L LFNC, improving.  Suspect cough may have GERD component. - Pulmonology consulted, appreciate recommendations - Start Pepcid 10 mg on hemodialysis days, after dialysis - Wean O2 - Azithromycin 5 mg daily (3/3 - 3/7) - Ceftriaxone 2 g daily (3/3 - 3/4), Augmentin (3/4 - 3/7) - Continue Brovana, Pulmicort, Yupelri - Phenergan, dextromethorphan twice daily for cough - PT/OT, recommending home health - Consider VQ scan to rule out PE if SOB worsens ESRD (end stage renal disease) (HCC) ESRD MWF dialysis. Weights over past year between 95-100kg; difficult to discern dry weight. Getting dialysis again today 3/5 with ultrafiltration. Up 3kg from admission; unsure if initial weight accurate. -Nephrology consulted, appreciate recommendations - Continue daily weights - Continue HD on MWF - Continue Reva-Vit, Rocaltrol Severe hypertension BP  today is 158/95, improved with home medications.  Appears he was not taking amlodipine at home, recommend he continue this medicine at discharge. - Continue carvedilol 25 mg BID, hydralazine 50 mg TID, amlodipine 10 mg daily AF (paroxysmal atrial fibrillation) (HCC) Pt with history of A fib, previously on Eliquis but not for the past couple years for unknown reason. Given uncertainty, will not restart Eliquis at this time. Pt remains in NSR at this time. - Pt to continue carvedilol 25 mg BID for rate control - No indication for cardiology consult at this time - Will follow-up with cardiology at discharge to discuss restarting Eliquis DMII (diabetes mellitus, type 2) (HCC) Pt managed with diet control. On gabapentin for neuropathy. - Will continue gabapentin 300 mg daily   Chronic and Stable Issues: Anemia: Likely due to ESRD, stable at baseline. Possible inhalational exposures from working in [place] immedi[place] post-9/11 (pt reports he has "lung nodules" from this)  FEN/GI: No indication for IV fluids; renal diet with fluid restriction of 2000 mL  PPx: Heparin Dispo: Home with home health tomorrow. Barriers include clinical improvement, weaning off O2.   Subjective:  No overnight events.  Today, pt reports he is feeling okay overall. He denies any shortness of breath while sitting up, but reports he has a little shortness of breath and cough when he lies on his Left side. He reports cough is still productive with some white phlegm, which he has to occasionally spit out. He does think his cough is better on the two cough suppressants he's on currently. Denies current chest pain or tightness, nausea, vomiting, abdominal pain, headache, dizziness.  Objective: Temp:  [98.1 F (36.7 C)-98.6 F (37 C)] 98.3 F (36.8 C) (03/05 0447) Pulse Rate:  [64-74] 68 (  03/05 0447) Resp:  [16-20] 20 (03/05 0447) BP: (106-159)/(75-102) 158/95 (03/05 0447) SpO2:  [93 %-100 %] 100 % (03/05 0447) Weight:   [100.4 kg-103.2 kg] 103.2 kg (03/05 0500) Physical Exam: General: Pt sitting up in bed eating breakfast, no acute distress. Cardiovascular: RRR, no murmurs/rubs/gallops. Respiratory: Clear to auscultation bilaterally, normal work of breathing. Abdomen: Normal bowel sounds present, no tenderness to palpation; soft, obese abdomen with some minimal distention compared to yesterday. Extremities: None to minimal ankle edema bilaterally. Grossly full ROM in all 4 extremities.  Laboratory: Most recent CBC Lab Results  Component Value Date   WBC 7.6 06/12/2023   HGB 9.9 (L) 06/12/2023   HCT 30.3 (L) 06/12/2023   MCV 96.8 06/12/2023   PLT 330 06/12/2023   Most recent BMP    Latest Ref Rng & Units 06/12/2023    7:32 AM  BMP  Glucose 70 - 99 mg/dL 621   BUN 6 - 20 mg/dL 46   Creatinine 3.08 - 1.24 mg/dL 6.57   Sodium 846 - 962 mmol/L 133   Potassium 3.5 - 5.1 mmol/L 4.6   Chloride 98 - 111 mmol/L 93   CO2 22 - 32 mmol/L 27   Calcium 8.9 - 10.3 mg/dL 9.7     Other pertinent labs: Trop 3/4: 49 (baseline 70-90)   Imaging/Diagnostic Tests: CXR 3/4 Radiologist Impression: Minimal right basilar subsegmental atelectasis is noted with probable small right pleural effusion. My interpretation: Improved opacities from prior CXR, though with continued bibasilar opacities, R>L.  Governor Rooks, Medical Student 06/12/2023, 8:30 AM  I was personally present and performed or re-performed the history, physical exam and medical decision making activities of this service and have verified that the service and findings are accurately documented in the student's note.  The plan above was developed by the supervising resident and discussed with medical student.  Tiffany Kocher, DO                  06/12/2023, 8:43 AM   FPTS Intern pager: [phone], text pages welcome Secure chat group [facility] Teaching Service

## 2023-06-12 NOTE — Progress Notes (Signed)
 Received patient in bed to unit.  Alert and oriented.  Informed consent signed and in chart.   TX duration: 3 hours and 30 minutes  Patient tolerated well.  Transported back to the room  Alert, without acute distress.  Hand-off given to patient's nurse.   Access used: Left Upper arm fistula Access issues: none  Total UF removed: 3.7L Medication(s) given: none   06/12/23 1234  Vitals  Temp 97.8 F (36.6 C)  BP (!) 140/86  Pulse Rate 68  Resp (!) 21  Oxygen Therapy  SpO2 100 %  O2 Device Nasal Cannula  O2 Flow Rate (L/min) 1 L/min  During Treatment Monitoring  Dialysate Potassium Concentration 3  Dialysate Calcium Concentration 2.5  Duration of HD Treatment -hour(s) 3.5 hour(s)  Cumulative Fluid Removed (mL) per Treatment  3640.64  HD Safety Checks Performed Yes  Intra-Hemodialysis Comments Tx completed  Dialysis Fluid Bolus Normal Saline  Bolus Amount (mL) 300 mL  Post Treatment  Dialyzer Clearance Clear  Liters Processed 84  Fluid Removed (mL) 3.7 mL  Tolerated HD Treatment No (Comment)  Post-Hemodialysis Comments Patient felt sick with 10 minutes left.  UF off at end for last 10 minutes  Fistula / Graft Left Upper arm Arteriovenous fistula  Placement Date/Time: 04/09/23 1150   Placed prior to admission: Yes  Orientation: Left  Access Location: Upper arm  Access Type: Arteriovenous fistula  Status Deaccessed     Stacie Glaze LPN [facility]

## 2023-06-13 ENCOUNTER — Other Ambulatory Visit (HOSPITAL_COMMUNITY): Payer: Self-pay

## 2023-06-13 ENCOUNTER — Telehealth: Payer: Self-pay | Admitting: Critical Care Medicine

## 2023-06-13 ENCOUNTER — Inpatient Hospital Stay (HOSPITAL_COMMUNITY)

## 2023-06-13 DIAGNOSIS — I5032 Chronic diastolic (congestive) heart failure: Secondary | ICD-10-CM | POA: Diagnosis not present

## 2023-06-13 DIAGNOSIS — J9601 Acute respiratory failure with hypoxia: Secondary | ICD-10-CM | POA: Diagnosis not present

## 2023-06-13 DIAGNOSIS — N186 End stage renal disease: Secondary | ICD-10-CM | POA: Diagnosis not present

## 2023-06-13 LAB — CBC
HCT: 33.8 % — ABNORMAL LOW (ref 39.0–52.0)
Hemoglobin: 11 g/dL — ABNORMAL LOW (ref 13.0–17.0)
MCH: 31.4 pg (ref 26.0–34.0)
MCHC: 32.5 g/dL (ref 30.0–36.0)
MCV: 96.6 fL (ref 80.0–100.0)
Platelets: 371 10*3/uL (ref 150–400)
RBC: 3.5 MIL/uL — ABNORMAL LOW (ref 4.22–5.81)
RDW: 13.8 % (ref 11.5–15.5)
WBC: 7 10*3/uL (ref 4.0–10.5)
nRBC: 0 % (ref 0.0–0.2)

## 2023-06-13 LAB — RENAL FUNCTION PANEL
Albumin: 3 g/dL — ABNORMAL LOW (ref 3.5–5.0)
Anion gap: 14 (ref 5–15)
BUN: 33 mg/dL — ABNORMAL HIGH (ref 6–20)
CO2: 27 mmol/L (ref 22–32)
Calcium: 9.3 mg/dL (ref 8.9–10.3)
Chloride: 91 mmol/L — ABNORMAL LOW (ref 98–111)
Creatinine, Ser: 7.55 mg/dL — ABNORMAL HIGH (ref 0.61–1.24)
GFR, Estimated: 8 mL/min — ABNORMAL LOW (ref >60)
Glucose, Bld: 119 mg/dL — ABNORMAL HIGH (ref 70–99)
Phosphorus: 6.6 mg/dL — ABNORMAL HIGH (ref 2.5–4.6)
Potassium: 4.8 mmol/L (ref 3.5–5.1)
Sodium: 132 mmol/L — ABNORMAL LOW (ref 135–145)

## 2023-06-13 LAB — IRON AND TIBC
Iron: 80 ug/dL (ref 45–182)
Saturation Ratios: 39 % (ref 17.9–39.5)
TIBC: 207 ug/dL — ABNORMAL LOW (ref 250–450)
UIBC: 127 ug/dL

## 2023-06-13 LAB — FERRITIN: Ferritin: 1181 ng/mL — ABNORMAL HIGH (ref 24–336)

## 2023-06-13 MED ORDER — BUDESONIDE 0.25 MG/2ML IN SUSP: 0.2500 mg | Freq: Two times a day (BID) | RESPIRATORY_TRACT | Status: DC

## 2023-06-13 MED ORDER — PROMETHAZINE HCL 6.25 MG/5ML PO SOLN
12.5000 mg | Freq: Four times a day (QID) | ORAL | 0 refills | Status: DC
  Filled 2023-06-13: qty 560, 14d supply, fill #0

## 2023-06-13 MED ORDER — FAMOTIDINE 10 MG PO TABS
10.0000 mg | ORAL_TABLET | Freq: Every day | ORAL | 0 refills | Status: DC
  Filled 2023-06-13: qty 30, 30d supply, fill #0

## 2023-06-13 MED ORDER — AMOXICILLIN-POT CLAVULANATE 500-125 MG PO TABS
1.0000 | ORAL_TABLET | Freq: Every day | ORAL | 0 refills | Status: AC
  Filled 2023-06-13: qty 2, 2d supply, fill #0

## 2023-06-13 MED ORDER — REVEFENACIN 175 MCG/3ML IN SOLN: 175.0000 ug | Freq: Every day | RESPIRATORY_TRACT | Status: DC

## 2023-06-13 MED ORDER — BUDESONIDE 0.25 MG/2ML IN SUSP
0.2500 mg | Freq: Two times a day (BID) | RESPIRATORY_TRACT | Status: DC
  Administered 2023-06-13: 0.25 mg via RESPIRATORY_TRACT
  Filled 2023-06-13: qty 2

## 2023-06-13 MED ORDER — ARFORMOTEROL TARTRATE 15 MCG/2ML IN NEBU: 15.0000 ug | INHALATION_SOLUTION | Freq: Two times a day (BID) | RESPIRATORY_TRACT | Status: DC

## 2023-06-13 MED ORDER — AMLODIPINE BESYLATE 10 MG PO TABS
10.0000 mg | ORAL_TABLET | Freq: Every day | ORAL | 0 refills | Status: DC
  Filled 2023-06-13: qty 30, 30d supply, fill #0

## 2023-06-13 MED ORDER — DEXTROMETHORPHAN POLISTIREX ER 30 MG/5ML PO SUER
30.0000 mg | Freq: Two times a day (BID) | ORAL | 0 refills | Status: AC | PRN
  Filled 2023-06-13: qty 89, 9d supply, fill #0

## 2023-06-13 MED ORDER — ACETAMINOPHEN 325 MG PO TABS: 650.0000 mg | ORAL_TABLET | Freq: Four times a day (QID) | ORAL | Status: AC | PRN

## 2023-06-13 MED ORDER — ALBUTEROL SULFATE (2.5 MG/3ML) 0.083% IN NEBU
2.5000 mg | INHALATION_SOLUTION | Freq: Four times a day (QID) | RESPIRATORY_TRACT | 1 refills | Status: AC | PRN
  Filled 2023-06-13: qty 75, 7d supply, fill #0

## 2023-06-13 MED ORDER — BREZTRI AEROSPHERE 160-9-4.8 MCG/ACT IN AERO
2.0000 | INHALATION_SPRAY | Freq: Two times a day (BID) | RESPIRATORY_TRACT | 0 refills | Status: AC
  Filled 2023-06-13: qty 10.7, 30d supply, fill #0

## 2023-06-13 NOTE — Assessment & Plan Note (Deleted)
 Pt with history of A fib, previously on Eliquis but not for the past couple years for unknown reason. Given uncertainty, will not restart Eliquis at this time. Pt remains in NSR at this time. - Pt to continue carvedilol 25 mg BID for rate control - Will follow-up with cardiology after discharge to discuss restarting Eliquis; PCP to place referral

## 2023-06-13 NOTE — Care Management Important Message (Signed)
 Important Message  Patient Details  Name: [name] MRN: [id number] Date of Birth: Nov 17, 1965   Important Message Given:  Yes - Medicare IM Patient ask that I send a copy to the home address he has the copy of the original one given     Dorena Bodo 06/13/2023, 1:37 PM

## 2023-06-13 NOTE — Evaluation (Signed)
 Physical Therapy Evaluation Patient Details Name: [name] MRN: [id number] DOB: 1965-12-06 Today's Date: 06/13/2023  History of Present Illness  Pt is a 58 y/o gentleman admitted 3/3 with cough and SOB, RLL PNA. PMHx: ESRD on HD MWF, inhalational exposures during 9/11 with chronic asthma, T2DM, OSA, AFib, HTN, CHF  Clinical Impression  Pt admitted with above diagnosis. PTA pt lived at home with family, I/mod I mobility. Pt currently with functional limitations due to the deficits listed below (see PT Problem List). On eval, he required CGA transfers, and CGA amb 100' without AD. Slow, mildly unsteady gait. Good sitting balance and fair standing balance. Pt on RA on arrival. Mobilized on RA with SpO2 90%. Pt will benefit from acute skilled PT to increase their independence and safety with mobility to allow discharge. Upon d/c, pt would benefit from HHPT.         If plan is discharge home, recommend the following: A little help with walking and/or transfers;A little help with bathing/dressing/bathroom;Assistance with cooking/housework;Assist for transportation   Can travel by private vehicle        Equipment Recommendations None recommended by PT  Recommendations for Other Services       Functional Status Assessment Patient has had a recent decline in their functional status and demonstrates the ability to make significant improvements in function in a reasonable and predictable amount of time.     Precautions / Restrictions Precautions Precautions: Fall;Other (comment) Recall of Precautions/Restrictions: Intact Precaution/Restrictions Comments: watch sats      Mobility  Bed Mobility Overal bed mobility: Modified Independent                  Transfers Overall transfer level: Needs assistance Equipment used: None Transfers: Sit to/from Stand Sit to Stand: Contact guard assist           General transfer comment: increased time     Ambulation/Gait Ambulation/Gait assistance: Contact guard assist Gait Distance (Feet): 100 Feet Assistive device: None Gait Pattern/deviations: Step-through pattern, Decreased stride length, Shuffle, Wide base of support Gait velocity: decreased Gait velocity interpretation: <1.31 ft/sec, indicative of household ambulator   General Gait Details: mildly unsteady but no overt LOB  Stairs            Wheelchair Mobility     Tilt Bed    Modified Rankin (Stroke Patients Only)       Balance Overall balance assessment: Needs assistance Sitting-balance support: Feet supported, No upper extremity supported Sitting balance-Leahy Scale: Good     Standing balance support: No upper extremity supported, During functional activity Standing balance-Leahy Scale: Fair                               Pertinent Vitals/Pain Pain Assessment Pain Assessment: No/denies pain    Home Living Family/patient expects to be discharged to:: Private residence Living Arrangements: Spouse/significant other Available Help at Discharge: Family;Available PRN/intermittently Type of Home: House Home Access: Level entry       Home Layout: One level Home Equipment: Pharmacist, hospital (2 wheels);Cane - single point;Grab bars - tub/shower;Hand held shower head;Wheelchair - manual      Prior Function Prior Level of Function : Independent/Modified Independent;Needs assist             Mobility Comments: I/mod I mobility household vs community distances, depending on the day ADLs Comments: spouse assists as needed     Extremity/Trunk Assessment   Upper Extremity Assessment  Upper Extremity Assessment: Overall WFL for tasks assessed    Lower Extremity Assessment Lower Extremity Assessment: Generalized weakness    Cervical / Trunk Assessment Cervical / Trunk Assessment: Normal  Communication   Communication Communication: No apparent difficulties    Cognition  Arousal: Alert Behavior During Therapy: WFL for tasks assessed/performed, Flat affect   PT - Cognitive impairments: No apparent impairments                         Following commands: Intact       Cueing Cueing Techniques: Verbal cues     General Comments General comments (skin integrity, edema, etc.): SpO2 90% on RA after ambulation    Exercises     Assessment/Plan    PT Assessment Patient needs continued PT services  PT Problem List Decreased strength;Decreased balance;Decreased mobility;Decreased activity tolerance;Cardiopulmonary status limiting activity       PT Treatment Interventions Functional mobility training;Balance training;Patient/family education;Gait training;Therapeutic activities;Therapeutic exercise    PT Goals (Current goals can be found in the Care Plan section)  Acute Rehab PT Goals Patient Stated Goal: home PT Goal Formulation: With patient Time For Goal Achievement: 06/27/23 Potential to Achieve Goals: Good    Frequency Min 2X/week     Co-evaluation               AM-PAC PT "6 Clicks" Mobility  Outcome Measure Help needed turning from your back to your side while in a flat bed without using bedrails?: None Help needed moving from lying on your back to sitting on the side of a flat bed without using bedrails?: None Help needed moving to and from a bed to a chair (including a wheelchair)?: A Little Help needed standing up from a chair using your arms (e.g., wheelchair or bedside chair)?: A Little Help needed to walk in hospital room?: A Little Help needed climbing 3-5 steps with a railing? : A Lot 6 Click Score: 19    End of Session Equipment Utilized During Treatment: Gait belt Activity Tolerance: Patient tolerated treatment well Patient left: in bed;with call bell/phone within reach Nurse Communication: Mobility status PT Visit Diagnosis: Muscle weakness (generalized) (M62.81);Difficulty in walking, not elsewhere classified  (R26.2)    Time: 8657-8469 PT Time Calculation (min) (ACUTE ONLY): 14 min   Charges:   PT Evaluation $PT Eval Moderate Complexity: 1 Mod   PT General Charges $$ ACUTE PT VISIT: 1 Visit         Ferd Glassing., PT  Office # [phone]   Ilda Foil 06/13/2023, 11:09 AM

## 2023-06-13 NOTE — Assessment & Plan Note (Deleted)
 ESRD MWF dialysis. Dialysis yesterday and got 3.6L off. Weights over past year between 95-100kg; difficult to discern dry weight but nephro putting it at ~94 kg. - Nephrology consulted, okay with discharge, appreciate recommendations - Continue daily weights - Continue HD on MWF - Continue Reva-Vit, Rocaltrol

## 2023-06-13 NOTE — Progress Notes (Deleted)
 Daily Progress Note Intern Pager: [phone]  Patient name: [name] Medical record number: [id number] Date of birth: 1966/02/18 Age: 58 y.o. Gender: male  Primary Care Provider: Vladimir Crofts, FNP Consultants: Nephrology, Pulmonology Code Status: Full  Pt Overview and Major Events to Date:  - 3/3: Admitted - 3/4 (0100): Received hemodialysis - 3/5: Received hemodialysis  Assessment and Plan:  [name] is a 58 y.o. male with PMHx significant for ESRD on dialysis, paroxysmal atrial fibrillation, HFpEF, T2DM, HTN, ?moderate persistent asthma? presenting for shortness of breath. Assessment & Plan Acute hypoxic respiratory failure (HCC) Intermittently requesting O2 supplementation for shortness of breath in setting of normal O2 sats; intermittently on 0.5-1L via [place] at pt's request but tolerating RA fine for 30+ minute periods of time and with walk test with ambulatory O2 sats down to 92%. Suspect cough may have GERD component. - Pt does not qualify for home O2 - Pulmonology consulted, appreciate recommendations; will establish with pulmonology in [facility] outpatient - Continue Pepcid 10 mg post-dialysis on hemodialysis days - Azithromycin 500 mg daily (3/3 - 3/5) - Ceftriaxone 2 g daily (3/3 - 3/4), Augmentin 500-125 mg daily (3/4 - 3/7) - Continue Brovana, Pulmicort, Yupelri while admitted; switch to [facility] and albuterol at discharge - Phenergan, dextromethorphan twice daily for cough - PT/OT, recommending home health ESRD (end stage renal disease) (HCC) ESRD MWF dialysis. Dialysis yesterday and got 3.6L off. Weights over past year between 95-100kg; difficult to discern dry weight but nephro putting it at ~94 kg. - Nephrology consulted, okay with discharge, appreciate recommendations - Continue daily weights - Continue HD on MWF - Continue Reva-Vit, Rocaltrol Severe hypertension BP overall improved on medications and dialysis, still with intermittently elevated  readings. Appears he was not taking amlodipine at home, recommend he continue this medicine at discharge. BP today 152/103. Nephrology not recommending inpatient BP management; will leave to outpatient. - Continue carvedilol 25 mg BID, hydralazine 50 mg TID, amlodipine 10 mg daily - PCP to manage medications outpatient as indicated AF (paroxysmal atrial fibrillation) (HCC) Pt with history of A fib, previously on Eliquis but not for the past couple years for unknown reason. Given uncertainty, will not restart Eliquis at this time. Pt remains in NSR at this time. - Pt to continue carvedilol 25 mg BID for rate control - Will follow-up with cardiology after discharge to discuss restarting Eliquis; PCP to place referral DMII (diabetes mellitus, type 2) (HCC) Pt managed with diet control. On gabapentin for neuropathy. - Will continue gabapentin 300 mg daily   Chronic and Stable Issues: Anemia of Chronic Disease: Due to ESRD; stable at baseline (10-11) Possible inhalational exposures from [place] in [place] post-9/11 (pt reports he has "lung nodules" from this)   FEN/GI: No indication for IV fluids; renal diet with fluid restriction of 2000 mL PPx: Heparin Dispo:Home with home health today. Barriers include none.   Subjective:  Overnight, pt requested to be placed back on 2L O2 via [place] for symptomatic shortness of breath; pulse ox normal. Was weaned down to 1L, then to RA, then pt again requested O2 and was placed back on 1L.  Today, pt reports he is feeling alright overall with no concerns. He denies any shortness of breath, and reports his cough is now occurring intermittently. No current headache, chest pain.  Objective: Temp:  [97.8 F (36.6 C)-98.7 F (37.1 C)] 98.5 F (36.9 C) (03/06 0823) Pulse Rate:  [64-74] 74 (03/06 0912) Resp:  [6-22] 18 (03/06  0912) BP: (125-162)/(81-108) 152/106 (03/06 0912) SpO2:  [94 %-100 %] 97 % (03/06 0912) FiO2 (%):  [28 %] 28 % (03/05  2049) Weight:  [93.1 kg-100.9 kg] 93.1 kg (03/06 0500) Physical Exam: General: Pt laying down in bed, no acute distress Cardiovascular: Regular rate and rhythm, no murmurs/rubs/gallops Respiratory: Normal work of breathing. Lungs clear to auscultation bilaterally. Slight expiratory wheeze present with deep breaths, sounds transmitted from upper airway. Abdomen: Normal bowel sounds present; soft, obese abdomen with no distention. Minimal tenderness to palpation of supraumbilical region, no rebound tenderness, no guarding. Extremities: Minimal bilateral ankle edema. Grossly normal ROM in all 4 extremities.   Laboratory: Most recent CBC Lab Results  Component Value Date   WBC 7.0 06/13/2023   HGB 11.0 (L) 06/13/2023   HCT 33.8 (L) 06/13/2023   MCV 96.6 06/13/2023   PLT 371 06/13/2023   Most recent BMP    Latest Ref Rng & Units 06/13/2023    7:29 AM  BMP  Glucose 70 - 99 mg/dL 409   BUN 6 - 20 mg/dL 33   Creatinine 8.11 - 1.24 mg/dL 9.14   Sodium 782 - 956 mmol/L 132   Potassium 3.5 - 5.1 mmol/L 4.8   Chloride 98 - 111 mmol/L 91   CO2 22 - 32 mmol/L 27   Calcium 8.9 - 10.3 mg/dL 9.3     Other pertinent labs: Ferritin: 1181 Iron: 80, TIBC: 207  Imaging/Diagnostic Tests: CXR Radiologist Impression: No active disease. My interpretation: Reviewed CXR and agree with above radiology findings.  Governor Rooks, Medical Student 06/13/2023, 9:29 AM  FPTS Intern pager: [phone], text pages welcome Secure chat group [facility] Teaching Service

## 2023-06-13 NOTE — Progress Notes (Signed)
 Transition of Care [facility]) - Inpatient Brief Assessment   Patient Details  Name: [name] MRN: [id number] Date of Birth: 05-25-1965  Transition of Care [facility]) CM/SW Contact:    Janae Bridgeman, RN Phone Number: 06/13/2023, 2:50 PM   Clinical Narrative: Transition of Care [facility]) - Inpatient Brief Assessment   Patient Details  Name: [name] MRN: [id number] Date of Birth: 09/13/1965  Transition of Care [facility]) CM/SW Contact:    Janae Bridgeman, RN Phone Number: 06/13/2023, 2:50 PM   Clinical Narrative: CM met with the patient at the bedside to discuss TOC needs.  The patient admitted to the hospital for respiratory failure and plans to return home today.    The patient lives with his wife at the home.  DME at the home includes nebulizer machine and states that he is almost out of his albuterol nebulizer solution.  MD notified and asked to have nebulizer solution called into the pharmacy as well.  Patient states that he normally drives to dialysis.  Patient prefers OUtpatient therapy.  Referral placed for OUtpatient PT and MD asked to co-sign.  No other TOC needs and patient plans to have family provide transportation to home.   Transition of Care Asessment: Insurance and Status: (P) Insurance coverage has been reviewed Patient has primary care physician: (P) Yes Home environment has been reviewed: (P) from home with wife Prior level of function:: (P) Independent Prior/Current Home Services: (P) No current home services Social Drivers of Health Review: (P) SDOH reviewed interventions complete Readmission risk has been reviewed: (P) Yes Transition of care needs: (P) transition of care needs identified, TOC will continue to follow    Transition of Care Asessment: Insurance and Status: (P) Insurance coverage has been reviewed Patient has primary care physician: (P) Yes Home environment has been reviewed: (P) from home with wife Prior level of function:: (P)  Independent Prior/Current Home Services: (P) No current home services Social Drivers of Health Review: (P) SDOH reviewed interventions complete Readmission risk has been reviewed: (P) Yes Transition of care needs: (P) transition of care needs identified, TOC will continue to follow

## 2023-06-13 NOTE — Plan of Care (Signed)
 Patient AAOx4. Weaned off O2, patient asks for it back, placed on 0.5L. Ambulatory O2 done, 98% O2 at rest; 92% O2 during ambulation. LUA AVF. No complaints of pain. Safety precautions maintained.   Discharge instructions discussed with patient, Verbalized understanding. RPIV removed.   Problem: Education: Goal: Knowledge of General Education information will improve Description: Including pain rating scale, medication(s)/side effects and non-pharmacologic comfort measures Outcome: Progressing   Problem: Clinical Measurements: Goal: Ability to maintain clinical measurements within normal limits will improve Outcome: Progressing   Problem: Activity: Goal: Risk for activity intolerance will decrease Outcome: Progressing

## 2023-06-13 NOTE — Telephone Encounter (Signed)
 OP hospital follow up to [facility] care requested. He has previously seen Dr. Ruthe Mannan in 11/2022 but per primary team wants to see a Pulmonologist in [place].   Steffanie Dunn, DO 06/13/23 8:24 AM [facility]  For contact information, see Amion. If no response to pager, please call PCCM consult pager. After hours, 7PM- 7AM, please call Elink.

## 2023-06-13 NOTE — Plan of Care (Signed)

## 2023-06-13 NOTE — Assessment & Plan Note (Deleted)
 BP overall improved on medications and dialysis, still with intermittently elevated readings. Appears he was not taking amlodipine at home, recommend he continue this medicine at discharge. BP today 152/103. Nephrology not recommending inpatient BP management; will leave to outpatient. - Continue carvedilol 25 mg BID, hydralazine 50 mg TID, amlodipine 10 mg daily - PCP to manage medications outpatient as indicated

## 2023-06-13 NOTE — Discharge Planning (Signed)
 [facility] Patient Discharge Orders- [facility] CLINIC: AF  Patient's name: [name] Admit/DC Dates: 06/10/2023 - 06/13/2023  Discharge Diagnoses: Acute hypoxic respiratory failure - Pulm edema + PNA   Recent Labs  Lab 06/13/23 0729  HGB 11.0*  K 4.8  CALCIUM 9.3  PHOS 6.6*  ALBUMIN 3.0*   Aranesp: Given: --   Date of last dose/amount: --   PRBC's Given: -- Date/# of units: -- ESA dose for discharge:  per protocol    Outpatient Dialysis Orders:  -Heparin: No change   -[place]  No change    -EDW: 94.5 kg   Access intervention/Change: ---   -IV Antibiotics:  --    OTHER/APPTS    Completed by: Tomasa Blase PA-C   D/C Meds to be reconciled by nurse after every discharge.    Reviewed by: MD:______ RN_______

## 2023-06-13 NOTE — Progress Notes (Signed)
 1610 Patient O2 was tapered from 2L to 1L with sats at 100%  0500 Sats remained 100% on 1L. Patient was weaned off O2. Patient was encouraged to call RN if he begin to have any difficulty breathing.  0648. Patient called RN to bedside, complains of having some difficulty breathing and wanted supplementary O2 despite sats at 98% on RA. Patient put back on O2 at 1L

## 2023-06-13 NOTE — Progress Notes (Signed)
 [facility] Progress Note  Subjective:  Seen in room 3.6 L off  Wt's today 93.1kg  No further coughing, orthopnea Still c/o SOB Standing BP's were stable, no drop   Vitals:   06/13/23 0648 06/13/23 0811 06/13/23 0823 06/13/23 0912  BP:   (!) 162/91 (!) 152/106  Pulse:  65 67 74  Resp:  16 18 18   Temp:   98.5 F (36.9 C)   TempSrc:      SpO2: 98%  100% 97%  Weight:      Height:        Exam: Gen alert, lying flat  No jvd or bruits Chest no rales/ rhonchi, CTA bilat post  RRR no MRG Abd soft ntnd no mass or ascites +bs Ext no LE edema Neuro is alert, Ox 3 , nf    L AVF +bruit     Renal-related home meds: - norvasc 10 - coreg 25 bid - renavite - renvela 3 ac - hydralazine 50 tid - rocaltrol mwf 0.25 mcg - xphozah 30 bid      OP HD: SW MWF 4h  B400   95.5kg  2K bath  AVF  Heparin 5000 + 2000 midrun   - coming off 4-7 kg over the last 3 wks - missed HD last Friday 2/28 - hectorol 6 mcg  - sensipar 180 mg three times per week    Assessment/ Plan: Acute hypoxic resp failure - on [place] O2. Looking back at the last year of CXR's there is a long hx of pulm edema. Got 3.5 L UF w/ HD Monday and another 3.6 L off Wed. Is down 7-9kg from admit and 2kg under his dry wt. He is lying flat, cough resolved. He still c/o SOB, but is on RA. Getting CXR to document a good baseline CXR for him. Attempted to educate pt about fluid restriction and never to be over the dry wt by 4 kg or more. OK to d/c from renal standpoint. Lower dry wt 1-2kg.  ESRD - on HD MWF. HD here Monday and wed. Next HD 3/07.  HTN - BP 210/110 in ED. Better w/ vol removal. .  Volume - has not been close to dry wt in 3 wks, coming off 3-5 over usually. IDWG too high, d/w patient at length today.  Anemia of esrd - Hb 11-12 here, follow.  Secondary hyperparathyroidism - CCa borderline high, phos is in range. Binders w/ meals. IV vdra and sensipar.  Atrial fib - per pmd DM2        Vinson Moselle MD  [facility] 06/13/2023, 10:54 AM  Recent Labs  Lab 06/12/23 0732 06/13/23 0729  HGB 9.9* 11.0*  ALBUMIN 2.7* 3.0*  CALCIUM 9.7 9.3  PHOS 7.6* 6.6*  CREATININE 9.74* 7.55*  K 4.6 4.8   Recent Labs  Lab 06/13/23 0729  IRON 80  TIBC 207*  FERRITIN 1,181*   Inpatient medications:  amLODipine  10 mg Oral Daily   amoxicillin-clavulanate  1 tablet Oral Daily   arformoterol  15 mcg Nebulization BID   azithromycin  500 mg Oral Daily   budesonide (PULMICORT) nebulizer solution  0.25 mg Nebulization BID   carvedilol  25 mg Oral BID WC   Chlorhexidine Gluconate Cloth  6 each Topical Q0600   cinacalcet  180 mg Oral Q M,W,F-1800   doxercalciferol  6 mcg Intravenous Q M,W,F-HD   famotidine  10 mg Oral Once per day on Monday Wednesday Friday   gabapentin  300 mg  Oral Daily   heparin  5,000 Units Subcutaneous Q8H   hydrALAZINE  50 mg Oral TID   multivitamin  1 tablet Oral QHS   promethazine  12.5 mg Oral Q6H   [START ON 06/14/2023] revefenacin  175 mcg Nebulization Daily   sevelamer carbonate  2,400 mg Oral TID WC   Vitamin D (Ergocalciferol)  50,000 Units Oral Q M,W,F     acetaminophen **OR** acetaminophen, dextromethorphan, heparin sodium (porcine), heparin sodium (porcine)

## 2023-06-13 NOTE — Discharge Summary (Addendum)
 Family Medicine Teaching [facility] Discharge Summary  Patient name: [name] Medical record number: [id number] Date of birth: 1965/04/30 Age: 58 y.o. Gender: male Date of Admission: 06/10/2023  Date of Discharge: 06/13/2023 Admitting Physician: Tiffany Kocher, DO  Primary Care Provider: Vladimir Crofts, FNP Consultants: Nephrology, Pulmonology  Indication for Hospitalization: Shortness of breath requiring O2 supplementation with suspected CAP  Brief Hospital Course:  [name] is a 58 y.o. male with history of ESRD on dialysis, paroxysmal atrial fibrillation, HFpEF, T2DM, HTN, moderate persistent asthma(?) who was admitted for shortness of breath.  Acute hypoxic respiratory failure  Pneumonia Pt presented 3/3 with hypoxemia initially requiring O2 supplementation via [place]; weaned to RA on 3/5 though afterwards intermittently requested O2 for symptomatic relief in setting of normal O2 sats. Did not qualify for home O2 based on ambulatory O2 test. Initial CXR with signs of excess fluid, equivocal for RLL PNA so pt started on IV Azithromycin, Ceftriaxone; switched to po Azithromycin on 3/4 (completed 3 day course on 3/5); switched from Ceftriaxone to po Augmentin on 3/5 and will complete 5 day course on 3/7. RPP negative. Hx of moderate persistent asthma and pt on home Breztri and albuterol nebulizer; received [name], Pulmicort nebulizers while admitted and was restarted on home regimen at discharge. Also with acute on chronic cough, treated with phenergan and dextromethorphan. Started on pepcid for continued cough 3/5. Pt followed with PT/OT, who recommended home health at discharge.  To complete antibiotic therapy and follow-up with pulmonology outpatient.  ESRD (end stage renal disease) Pt with ESRD on MWF dialysis. Missed dialysis on 3/3, but otherwise reported compliance. Nephro was consulted and pt dialyzed 0100 on 3/4; resumed regular dialysis following. Dry weight per  nephro ~94kg. Continued on Reva-Vit, Rocaltrol during hospitalization.  Severe uncontrolled hypertension Pt with BP as high as 231/130 on day of admission 3/3; he had not taken his home BP medications that day but otherwise reported compliance. He was restarted on carvedilol 25 mg BID, hydralazine 50 mg TID, amlodipine 10 mg daily with improvement in BP following. Still with intermittent elevated BPs into 150s-160s/90s, but will follow-up with outpatient provider for further BP management.  Other chronic conditions were medically managed with home medications and formulary alternatives as necessary: HFpEF: Echo 06/2023 with EF unchanged (60-65%), grade II diastolic dysfunction, severe concentric LVH PAF: Continued on carvedilol 25 mg BID for rate control DMII: Diet controlled, continued on gabapentin 300 mg for neuropathy  Follow-up recommendations:  - Follow-up with cardiology outpatient (PCP to schedule); discuss restarting Eliquis - Follow-up with pulmonology outpatient, referral placed and they will call pt to schedule - Follow-up with PCP outpatient; monitor BP, consider outpatient sleep study for CPAP - Home Health established for discharge - Complete antibiotic regimen (Augmentin last dose on 3/7)  Disposition: Home with home health  Discharge Condition: Stable  Discharge Exam:  Vitals:   06/13/23 0912 06/13/23 1059  BP: (!) 152/106 126/69  Pulse: 74   Resp: 18   Temp:    SpO2: 97%    General: Pt laying down in bed, no acute distress. CV: Regular rate and rhythm, no murmurs/rubs/gallops Pulm: Normal work of breathing. Lungs clear to auscultation bilaterally. Slight expiratory wheeze present with deep breaths, sounds transmitted from upper airway. Abdomen: Normal bowel sounds present; soft, obese abdomen with no distention. Minimal tenderness to palpation of supraumbilical region, no rebound tenderness, no guarding. Extremities: Minimal bilateral ankle edema. Grossly normal ROM  in all 4 extremities.   Significant  Procedures: None  Significant Labs and Imaging:  Recent Labs  Lab 06/12/23 0732 06/13/23 0729  WBC 7.6 7.0  HGB 9.9* 11.0*  HCT 30.3* 33.8*  PLT 330 371   Recent Labs  Lab 06/12/23 0732 06/13/23 0729  NA 133* 132*  K 4.6 4.8  CL 93* 91*  CO2 27 27  GLUCOSE 113* 119*  BUN 46* 33*  CREATININE 9.74* 7.55*  CALCIUM 9.7 9.3  PHOS 7.6* 6.6*  ALBUMIN 2.7* 3.0*    Echo 06/11/2023:  1. Left ventricular ejection fraction, by estimation, is 60 to 65%. The left ventricle has normal function. The left ventricle has no regional wall motion abnormalities. There is severe concentric left ventricular  hypertrophy. Left ventricular diastolic  parameters are consistent with Grade II diastolic dysfunction  (pseudonormalization). The average left ventricular global longitudinal strain is -19.4 %. The global longitudinal strain is normal.   2. Right ventricular systolic function is normal. The right ventricular size is mildly enlarged. There is normal pulmonary artery systolic pressure. The estimated right ventricular systolic pressure is 33.5 mmHg.   3. Left atrial size was mildly dilated.   4. The mitral valve is degenerative. Mild mitral valve regurgitation. No evidence of mitral stenosis. Moderate mitral annular calcification.   5. The aortic valve is tricuspid. Aortic valve regurgitation is not visualized. No aortic stenosis is present.   6. There is mild dilatation of the ascending aorta, measuring 41 mm.   7. The inferior vena cava is normal in size with greater than 50% respiratory variability, suggesting right atrial pressure of 3 mmHg.   Initial CXR 06/10/23: 1. Suspected layering small right pleural effusion with right basilar opacity, which could reflect atelectasis or infiltrate. 2. Cardiomegaly with pulmonary vascular congestion and findings suggestive of mild interstitial edema.  CXR at discharge 06/13/23: No active disease.   Results/Tests  Pending at Time of Discharge: None  Discharge Medications:  Allergies as of 06/13/2023       Reactions   Hydrocodone Itching   Oxycodone Itching        Medication List     TAKE these medications    Accu-Chek Softclix Lancets lancets Use as directed.  For insulin-dependent type 2 diabetes Monitor  glucose 3 times a day   acetaminophen 325 MG tablet Commonly known as: TYLENOL Take 2 tablets (650 mg total) by mouth every 6 (six) hours as needed for mild pain (pain score 1-3) (or Fever >/= 101).   albuterol 108 (90 Base) MCG/ACT inhaler Commonly known as: Ventolin HFA Inhale 2 puffs into the lungs every 6 (six) hours as needed for wheezing or shortness of breath.   albuterol (2.5 MG/3ML) 0.083% nebulizer solution Commonly known as: PROVENTIL Take 3 mLs (2.5 mg total) by nebulization every 6 (six) hours as needed for wheezing or shortness of breath.   amLODipine 10 MG tablet Commonly known as: NORVASC Take 1 tablet (10 mg total) by mouth daily.   amoxicillin-clavulanate 500-125 MG tablet Commonly known as: AUGMENTIN Take 1 tablet by mouth daily for 2 days.   benzonatate 100 MG capsule Commonly known as: TESSALON Take 200 mg by mouth 3 (three) times daily as needed for cough.   Breztri Aerosphere 160-9-4.8 MCG/ACT Aero Generic drug: Budeson-Glycopyrrol-Formoterol Inhale 2 puffs into the lungs in the morning and at bedtime.   busPIRone 5 MG tablet Commonly known as: BUSPAR Take 5 mg by mouth 2 (two) times daily.   calcitRIOL 0.25 MCG capsule Commonly known as: ROCALTROL Take 1 capsule (0.25 mcg  total) by mouth every Monday, Wednesday, and Friday.   camphor-menthol lotion Commonly known as: SARNA Apply topically 2 (two) times daily. What changed: how much to take   carvedilol 25 MG tablet Commonly known as: COREG Take 1 tablet (25 mg total) by mouth 2 (two) times daily with a meal.   Cough DM 30 MG/5ML liquid Generic drug: dextromethorphan Take 5 mLs (30 mg  total) by mouth 2 (two) times daily as needed for up to 14 days for cough.   famotidine 10 MG tablet Commonly known as: PEPCID Take 1 tablet (10 mg total) by mouth daily.   gabapentin 300 MG capsule Commonly known as: NEURONTIN Take 1 capsule (300 mg total) by mouth daily.   glucose blood test strip Use as instructed For insulin-dependent type 2 diabetes Monitor  glucose 3 times a day   hydrALAZINE 50 MG tablet Commonly known as: APRESOLINE Take 1 tablet (50 mg total) by mouth 3 (three) times daily. What changed: additional instructions   hydrOXYzine 25 MG tablet Commonly known as: ATARAX Take 25 mg by mouth daily.   levocetirizine 5 MG tablet Commonly known as: XYZAL Take 1 tablet (5 mg total) by mouth every evening.   lidocaine 2 % jelly Commonly known as: XYLOCAINE Apply 1 Application topically daily.   multivitamin Tabs tablet Take 1 tablet by mouth at bedtime.   ondansetron 4 MG disintegrating tablet Commonly known as: ZOFRAN-ODT Take 4 mg by mouth daily as needed for vomiting or nausea.   polyethylene glycol powder 17 GM/SCOOP powder Commonly known as: GLYCOLAX/MIRALAX Mix as directed and take 1 capful (17 g) by mouth daily. What changed:  when to take this reasons to take this   promethazine 6.25 MG/5ML solution Commonly known as: PHENERGAN Take 10 mLs (12.5 mg total) by mouth every 6 (six) hours for 14 days.   sevelamer carbonate 800 MG tablet Commonly known as: RENVELA Take 3 tablets (2,400 mg total) by mouth 3 (three) times daily with meals.   traMADol 50 MG tablet Commonly known as: ULTRAM Take 1 tablet (50 mg total) by mouth every 12 (twelve) hours as needed for moderate pain (pain score 4-6).   VENOFER IV Inject 50 mg into the vein See admin instructions. Given on days of Dialysis: Monday, Wednesday, and Friday.   Vitamin D (Ergocalciferol) 1.25 MG (50000 UNIT) Caps capsule Commonly known as: DRISDOL Take 50,000 Units by mouth See admin  instructions. Take one tablet by mouth on Monday, Wednesdays and Fridays per patient   Xphozah 30 MG Tabs Generic drug: Tenapanor HCl (CKD) Take 1 tablet by mouth 2 (two) times daily.        Discharge Instructions: Please refer to Patient Instructions section of EMR for full details.  Patient was counseled important signs and symptoms that should prompt return to medical care, changes in medications, dietary instructions, activity restrictions, and follow up appointments.   Follow-Up Appointments:  Follow-up Information     Vladimir Crofts, FNP. Schedule an appointment as soon as possible for a visit.   Specialty: Family Medicine Why: Please make an appointment within the next week with your primary doctor for hospital follow-up. Contact information: [address] [place] [address] [phone]         [facility] at [facility] Follow up.   Specialty: Pulmonology Why: You will receive a call to schedule an appointment with [facility] Pulmon[facility]. You may also call their number to schedule an appointment. Contact information:  [address] [place] [address] [phone]        [facility] Follow up.   Specialty: Rehabilitation Why: Please call the Outpatient Rehabilitation center and follow up regarding outpatient therapy needs. Contact information: [address] [place] [address] [phone]                Governor Rooks, Medical Student 06/13/2023, 5:59 PM  Viewed and discussed patient with medical student.  Jerre Simon, MD PGY-3, [facility] Family Medicine Resident  Please page [phone] with questions.

## 2023-06-13 NOTE — Assessment & Plan Note (Deleted)
 Intermittently requesting O2 supplementation for shortness of breath in setting of normal O2 sats; intermittently on 0.5-1L via [place] at pt's request but tolerating RA fine for 30+ minute periods of time and with walk test with ambulatory O2 sats down to 92%. Suspect cough may have GERD component. - Pt does not qualify for home O2 - Pulmonology consulted, appreciate recommendations; will establish with pulmonology in [facility] outpatient - Continue Pepcid 10 mg post-dialysis on hemodialysis days - Azithromycin 500 mg daily (3/3 - 3/5) - Ceftriaxone 2 g daily (3/3 - 3/4), Augmentin 500-125 mg daily (3/4 - 3/7) - Continue Brovana, Pulmicort, Yupelri while admitted; switch to [facility] and albuterol at discharge - Phenergan, dextromethorphan twice daily for cough - PT/OT, recommending home health

## 2023-06-13 NOTE — Progress Notes (Signed)
D/C order noted. Contacted [facility] to be advised of pt's d/c today and that pt should resume care tomorrow.   Olivia Canter Renal Navigator [phone]

## 2023-06-13 NOTE — Assessment & Plan Note (Deleted)
 Pt managed with diet control. On gabapentin for neuropathy. - Will continue gabapentin 300 mg daily

## 2023-06-14 ENCOUNTER — Telehealth (HOSPITAL_COMMUNITY): Payer: Self-pay | Admitting: Nephrology

## 2023-06-14 NOTE — Telephone Encounter (Signed)
 Transition of care contact from inpatient facility  Date of Discharge: 06/13/23 Date of Contact:06/14/23 - attempt #1 Method of contact: Phone  Attempted to contact patient to discuss transition of care from inpatient admission. Patient did not answer the phone. Message was left on the patient's voicemail with call back number [phone].  Ozzie Hoyle, PA-C [employer] Pager [phone]

## 2023-07-05 ENCOUNTER — Telehealth: Payer: Self-pay | Admitting: Pulmonary Disease

## 2023-07-05 NOTE — Telephone Encounter (Signed)
 There is not an open order for a Echo if that is what is needed looks like patient just had one on 06/11/23 please advise.    Copied from CRM [id number]. Topic: Appointments - Scheduling Inquiry for Clinic >> Jul 05, 2023  9:07 AM [name] wrote: Reason for CRM: Please call patient back to schedule his electrocardiogram per his referral from 3/3 - Patient is having shortness of breath but declined to speak with nurse triage as his nurse care taker is already with him now.

## 2023-07-24 ENCOUNTER — Emergency Department (HOSPITAL_COMMUNITY)
Admission: EM | Admit: 2023-07-24 | Discharge: 2023-07-24 | Disposition: A | Discharge status: Home / Self Care | Attending: Emergency Medicine | Admitting: Emergency Medicine

## 2023-07-24 ENCOUNTER — Other Ambulatory Visit: Payer: Self-pay

## 2023-07-24 ENCOUNTER — Encounter (HOSPITAL_COMMUNITY): Payer: Self-pay | Admitting: Emergency Medicine

## 2023-07-24 ENCOUNTER — Emergency Department (HOSPITAL_COMMUNITY)

## 2023-07-24 DIAGNOSIS — N189 Chronic kidney disease, unspecified: Secondary | ICD-10-CM

## 2023-07-24 DIAGNOSIS — R072 Precordial pain: Secondary | ICD-10-CM | POA: Insufficient documentation

## 2023-07-24 DIAGNOSIS — N186 End stage renal disease: Secondary | ICD-10-CM | POA: Diagnosis not present

## 2023-07-24 DIAGNOSIS — E1122 Type 2 diabetes mellitus with diabetic chronic kidney disease: Secondary | ICD-10-CM | POA: Diagnosis not present

## 2023-07-24 DIAGNOSIS — R079 Chest pain, unspecified: Secondary | ICD-10-CM

## 2023-07-24 DIAGNOSIS — Z794 Long term (current) use of insulin: Secondary | ICD-10-CM | POA: Diagnosis not present

## 2023-07-24 DIAGNOSIS — Z992 Dependence on renal dialysis: Secondary | ICD-10-CM | POA: Insufficient documentation

## 2023-07-24 DIAGNOSIS — I120 Hypertensive chronic kidney disease with stage 5 chronic kidney disease or end stage renal disease: Secondary | ICD-10-CM | POA: Diagnosis not present

## 2023-07-24 DIAGNOSIS — I2510 Atherosclerotic heart disease of native coronary artery without angina pectoris: Secondary | ICD-10-CM | POA: Insufficient documentation

## 2023-07-24 LAB — BASIC METABOLIC PANEL WITH GFR
Anion gap: 13 (ref 5–15)
BUN: 24 mg/dL — ABNORMAL HIGH (ref 6–20)
CO2: 30 mmol/L (ref 22–32)
Calcium: 9.2 mg/dL (ref 8.9–10.3)
Chloride: 93 mmol/L — ABNORMAL LOW (ref 98–111)
Creatinine, Ser: 6.64 mg/dL — ABNORMAL HIGH (ref 0.61–1.24)
GFR, Estimated: 9 mL/min — ABNORMAL LOW (ref >60)
Glucose, Bld: 128 mg/dL — ABNORMAL HIGH (ref 70–99)
Potassium: 3.5 mmol/L (ref 3.5–5.1)
Sodium: 136 mmol/L (ref 135–145)

## 2023-07-24 LAB — CBC
HCT: 35.3 % — ABNORMAL LOW (ref 39.0–52.0)
Hemoglobin: 11.7 g/dL — ABNORMAL LOW (ref 13.0–17.0)
MCH: 32 pg (ref 26.0–34.0)
MCHC: 33.1 g/dL (ref 30.0–36.0)
MCV: 96.4 fL (ref 80.0–100.0)
Platelets: 309 10*3/uL (ref 150–400)
RBC: 3.66 MIL/uL — ABNORMAL LOW (ref 4.22–5.81)
RDW: 13.9 % (ref 11.5–15.5)
WBC: 8.9 10*3/uL (ref 4.0–10.5)
nRBC: 0 % (ref 0.0–0.2)

## 2023-07-24 LAB — TROPONIN I (HIGH SENSITIVITY)
Troponin I (High Sensitivity): 56 ng/L — ABNORMAL HIGH (ref <18)
Troponin I (High Sensitivity): 46 ng/L — ABNORMAL HIGH (ref <18)

## 2023-07-24 MED ORDER — ASPIRIN 81 MG PO CHEW
324.0000 mg | CHEWABLE_TABLET | Freq: Once | ORAL | Status: AC
  Administered 2023-07-24: 324 mg via ORAL
  Filled 2023-07-24: qty 4

## 2023-07-24 MED ORDER — FENTANYL CITRATE PF 50 MCG/ML IJ SOSY
50.0000 ug | PREFILLED_SYRINGE | Freq: Once | INTRAMUSCULAR | Status: AC
  Administered 2023-07-24: 50 ug via INTRAVENOUS
  Filled 2023-07-24: qty 1

## 2023-07-24 NOTE — ED Provider Notes (Signed)
 [place] EMERGENCY DEPARTMENT AT [facility] Provider Note   CSN: [id number] Arrival date & time: 07/24/23  0865     History  Chief Complaint  Patient presents with   Chest Pain    [name] is a 58 y.o. male.   Chest Pain    Pt started experiencing a pounding in his chest during dialysis.  He started having a headache as well as some cramping in his legs.  No diaphoresis. No shortness of breath.   Pt was getting his routine dialysis.  No missed days.  Pt states he does have history of CAD, possible mi 14 years ago. No history of stents.  Pt has cramping in both legs.  He has had this with dialysis before.  Home Medications Prior to Admission medications   Medication Sig Start Date End Date Taking? Authorizing Provider  Accu-Chek Softclix Lancets lancets Use as directed.  For insulin-dependent type 2 diabetes Monitor  glucose 3 times a day Patient taking differently: 1 each by Other route See admin instructions. Use as directed.  For insulin-dependent type 2 diabetes Monitor  glucose 3 times a day 01/06/20   Albertine Grates, MD  acetaminophen (TYLENOL) 325 MG tablet Take 2 tablets (650 mg total) by mouth every 6 (six) hours as needed for mild pain (pain score 1-3) (or Fever >/= 101). 06/13/23   Jerre Simon, MD  albuterol (PROVENTIL) (2.5 MG/3ML) 0.083% nebulizer solution Take 3 mLs (2.5 mg total) by nebulization every 6 (six) hours as needed for wheezing or shortness of breath. 06/13/23   Jerre Simon, MD  albuterol (VENTOLIN HFA) 108 (90 Base) MCG/ACT inhaler Inhale 2 puffs into the lungs every 6 (six) hours as needed for wheezing or shortness of breath. 09/25/22   Alfonse Spruce, MD  amLODipine (NORVASC) 10 MG tablet Take 1 tablet (10 mg total) by mouth daily. 06/13/23   Jerre Simon, MD  benzonatate (TESSALON) 100 MG capsule Take 200 mg by mouth 3 (three) times daily as needed for cough. 04/18/23   [provider]  busPIRone (BUSPAR) 5 MG tablet Take 5 mg  by mouth 2 (two) times daily. 02/05/23   [provider]  calcitRIOL (ROCALTROL) 0.25 MCG capsule Take 1 capsule (0.25 mcg total) by mouth every Monday, Wednesday, and Friday. 01/08/20   Albertine Grates, MD  camphor-menthol [facility]) lotion Apply topically 2 (two) times daily. Patient taking differently: Apply 1 Application topically 2 (two) times daily. 12/03/22   Lockie Mola, MD  carvedilol (COREG) 25 MG tablet Take 1 tablet (25 mg total) by mouth 2 (two) times daily with a meal. 05/24/22   Rocky Morel, DO  famotidine (PEPCID) 10 MG tablet Take 1 tablet (10 mg total) by mouth daily. 06/13/23   Jerre Simon, MD  gabapentin (NEURONTIN) 300 MG capsule Take 1 capsule (300 mg total) by mouth daily. Patient not taking: Reported on 06/10/2023 01/25/23   Peterson Ao, MD  glucose blood test strip Use as instructed For insulin-dependent type 2 diabetes Monitor  glucose 3 times a day Patient taking differently: 1 each by Other route See admin instructions. Use as instructed For insulin-dependent type 2 diabetes Monitor  glucose 3 times a day 01/06/20   Albertine Grates, MD  hydrALAZINE (APRESOLINE) 50 MG tablet Take 1 tablet (50 mg total) by mouth 3 (three) times daily. Patient taking differently: Take 50 mg by mouth 3 (three) times daily. Patient out of medication 04/08/2023, for about a month. 02/19/23 05/23/23  Morene Crocker, MD  hydrOXYzine (  ATARAX) 25 MG tablet Take 25 mg by mouth daily. 02/08/23   [provider]  Iron Sucrose (VENOFER IV) Inject 50 mg into the vein See admin instructions. Given on days of Dialysis: Monday, Wednesday, and Friday. 03/08/23 03/06/24  [provider]  levocetirizine (XYZAL) 5 MG tablet Take 1 tablet (5 mg total) by mouth every evening. 09/25/22   Alfonse Spruce, MD  lidocaine (XYLOCAINE) 2 % jelly Apply 1 Application topically daily. 11/26/22   Alvira Monday, MD  multivitamin (RENA-VIT) TABS tablet Take 1 tablet by mouth at bedtime. 05/24/22    Rocky Morel, DO  ondansetron (ZOFRAN-ODT) 4 MG disintegrating tablet Take 4 mg by mouth daily as needed for vomiting or nausea. 04/18/23   [provider]  polyethylene glycol powder (GLYCOLAX/MIRALAX) 17 GM/SCOOP powder Mix as directed and take 1 capful (17 g) by mouth daily. Patient taking differently: Take 17 g by mouth daily as needed for mild constipation. 02/20/23   Morene Crocker, MD  promethazine (PHENERGAN) 6.25 MG/5ML solution Take 10 mLs (12.5 mg total) by mouth every 6 (six) hours for 14 days. 06/13/23 06/27/23  Jerre Simon, MD  sevelamer carbonate (RENVELA) 800 MG tablet Take 3 tablets (2,400 mg total) by mouth 3 (three) times daily with meals. 12/03/22   Lockie Mola, MD  traMADol (ULTRAM) 50 MG tablet Take 1 tablet (50 mg total) by mouth every 12 (twelve) hours as needed for moderate pain (pain score 4-6). 01/24/23   Peterson Ao, MD  Vitamin D, Ergocalciferol, (DRISDOL) 1.25 MG (50000 UNIT) CAPS capsule Take 50,000 Units by mouth See admin instructions. Take one tablet by mouth on Monday, Wednesdays and Fridays per patient 08/17/19   [provider]  XPHOZAH 30 MG TABS Take 1 tablet by mouth 2 (two) times daily. 01/15/23   [provider]  insulin aspart protamine - aspart (NOVOLOG MIX 70/30 FLEXPEN) (70-30) 100 UNIT/ML FlexPen Inject 15-30 Units into the skin 2 (two) times daily.  05/24/22  [provider]      Allergies    Hydrocodone and Oxycodone    Review of Systems   Review of Systems  Cardiovascular:  Positive for chest pain.    Physical Exam Updated Vital Signs BP (!) 166/92   Pulse 79   Temp 98.1 F (36.7 C) (Oral)   Resp 15   Ht 1.702 m (5\' 7" )   Wt 100.2 kg   SpO2 100%   BMI 34.61 kg/m  Physical Exam Vitals and nursing note reviewed.  Constitutional:      Appearance: He is well-developed. He is not diaphoretic.  HENT:     Head: Normocephalic and atraumatic.     Right Ear: External ear normal.     Left  Ear: External ear normal.  Eyes:     General: No scleral icterus.       Right eye: No discharge.        Left eye: No discharge.     Conjunctiva/sclera: Conjunctivae normal.  Neck:     Trachea: No tracheal deviation.  Cardiovascular:     Rate and Rhythm: Normal rate and regular rhythm.  Pulmonary:     Effort: Pulmonary effort is normal. No respiratory distress.     Breath sounds: Normal breath sounds. No stridor. No wheezing or rales.  Chest:     Chest wall: No tenderness.  Abdominal:     General: Bowel sounds are normal. There is no distension.     Palpations: Abdomen is soft.  Tenderness: There is no abdominal tenderness. There is no guarding or rebound.  Musculoskeletal:        General: No tenderness or deformity.     Cervical back: Neck supple.     Right lower leg: No edema.     Left lower leg: No edema.     Comments: Left av fistula  Skin:    General: Skin is warm and dry.     Findings: No rash.  Neurological:     General: No focal deficit present.     Mental Status: He is alert.     Cranial Nerves: No cranial nerve deficit, dysarthria or facial asymmetry.     Sensory: No sensory deficit.     Motor: No abnormal muscle tone or seizure activity.     Coordination: Coordination normal.  Psychiatric:        Mood and Affect: Mood normal.     ED Results / Procedures / Treatments   Labs (all labs ordered are listed, but only abnormal results are displayed) Labs Reviewed  BASIC METABOLIC PANEL WITH GFR - Abnormal; Notable for the following components:      Result Value   Chloride 93 (*)    Glucose, Bld 128 (*)    BUN 24 (*)    Creatinine, Ser 6.64 (*)    GFR, Estimated 9 (*)    All other components within normal limits  CBC - Abnormal; Notable for the following components:   RBC 3.66 (*)    Hemoglobin 11.7 (*)    HCT 35.3 (*)    All other components within normal limits  TROPONIN I (HIGH SENSITIVITY) - Abnormal; Notable for the following components:   Troponin  I (High Sensitivity) 56 (*)    All other components within normal limits  TROPONIN I (HIGH SENSITIVITY) - Abnormal; Notable for the following components:   Troponin I (High Sensitivity) 46 (*)    All other components within normal limits    EKG EKG Interpretation Date/Time:  Wednesday July 24 2023 09:01:00 EDT Ventricular Rate:  87 PR Interval:  179 QRS Duration:  92 QT Interval:  411 QTC Calculation: 495 R Axis:   -4  Text Interpretation: Sinus rhythm Probable left atrial enlargement Probable anteroseptal infarct, old No significant change since last tracing Confirmed by Trish Furl [phone]) on 07/24/2023 9:07:01 AM  Radiology DG Chest Portable 1 View Result Date: 07/24/2023 CLINICAL DATA:  Chest pain. EXAM: PORTABLE CHEST 1 VIEW COMPARISON:  Chest radiograph dated 06/13/2023. FINDINGS: Stable cardiomegaly. No edema, focal consolidation, sizeable pleural effusion, or pneumothorax. No acute osseous abnormality. IMPRESSION: No active disease. Electronically Signed   By: Mannie Seek M.D.   On: 07/24/2023 11:15    Procedures Procedures    Medications Ordered in ED Medications  aspirin chewable tablet 324 mg (324 mg Oral Given 07/24/23 0945)  fentaNYL (SUBLIMAZE) injection 50 mcg (50 mcg Intravenous Given 07/24/23 0946)    ED Course/ Medical Decision Making/ A&P Clinical Course as of 07/24/23 1315  Wed Jul 24, 2023  0922 Clamps removed from left av fistula.  No signs of bleeding [JK]  1135 Troponin I (High Sensitivity)(!) Initial protein elevated at 56 however this is similar compared to previous values [JK]  1135 CBC(!) [JK]  1135 Stable hemoglobin [JK]  1135 Basic metabolic panel(!) Increased BUN and creatinine consistent with his chronic kidney disease.  No hyperkalemia noted [JK]  1302 Troponin I (High Sensitivity)(!) Second troponin decreased at 46 [JK]    Clinical Course User Index [JK]  Trish Furl, MD                                 Medical Decision  Making Amount and/or Complexity of Data Reviewed Labs: ordered. Decision-making details documented in ED Course. Radiology: ordered.  Risk OTC drugs. Prescription drug management.   Patient presented to the ED for evaluation of an episode of chest pain and leg cramping during his dialysis today.  Patient states he had similar episodes in the past.  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  He also reports possible history of coronary artery disease although has never had a cardiac catheterization or stenting.  Patient has several cardiac risk factors including diabetes hypertension and chronic kidney disease.  Patient's ED workup is reassuring.  His chest pain resolved.  His troponins are elevated however they are stable and actually decreased compared to previous values.  I suspect this is related to his chronic kidney disease and not acute coronary syndrome.  EKG is reassuring.  Patient does not have findings to suggest pneumonia or pneumothorax on exam.  He was monitored in the ED and did not have any recurrent symptoms.  He appears stable for discharge.  Will have him follow-up for his next dialysis session as planned.  Will also have him follow-up with his cardiologist to discuss further evaluation.        Final Clinical Impression(s) / ED Diagnoses Final diagnoses:  Chest pain, unspecified type  Chronic kidney disease, unspecified CKD stage    Rx / DC Orders ED Discharge Orders          Ordered    Ambulatory referral to Cardiology       Comments: If you have not heard from the Cardiology office within the next 72 hours please call [phone].   07/24/23 1314              Trish Furl, MD 07/24/23 1315

## 2023-07-24 NOTE — ED Triage Notes (Signed)
 Pt BIB [facility] from hemodialysis due to substernal chest pain and leg cramping.  Patient only got two hours of hemodialysis (2 Liters pulled off).   VS BP 160/90, CBG 130, HR 90. 20g right AC.

## 2023-07-24 NOTE — Discharge Instructions (Addendum)
 Follow-up with your kidney doctor for your next dialysis.  Follow-up with a cardiologist to discuss further evaluation of your episode of chest pain in the ED today.

## 2023-08-07 ENCOUNTER — Telehealth: Payer: Self-pay

## 2023-08-07 ENCOUNTER — Ambulatory Visit: Admitting: Pulmonary Disease

## 2023-08-07 ENCOUNTER — Encounter: Payer: Self-pay | Admitting: Pulmonary Disease

## 2023-08-07 VITALS — BP 132/70 | HR 92 | Ht 67.0 in | Wt 227.0 lb

## 2023-08-07 DIAGNOSIS — R0982 Postnasal drip: Secondary | ICD-10-CM

## 2023-08-07 DIAGNOSIS — R053 Chronic cough: Secondary | ICD-10-CM

## 2023-08-07 DIAGNOSIS — J410 Simple chronic bronchitis: Secondary | ICD-10-CM

## 2023-08-07 MED ORDER — BUDESONIDE 0.5 MG/2ML IN SUSP
0.5000 mg | Freq: Two times a day (BID) | RESPIRATORY_TRACT | 12 refills | Status: AC
Start: 2023-08-07 — End: ?

## 2023-08-07 MED ORDER — FLUTICASONE PROPIONATE 50 MCG/ACT NA SUSP: 1.0000 | Freq: Every day | NASAL | 2 refills | Status: DC

## 2023-08-07 MED ORDER — IPRATROPIUM BROMIDE 0.03 % NA SOLN: 2.0000 | Freq: Two times a day (BID) | NASAL | 12 refills | Status: AC

## 2023-08-07 MED ORDER — ARFORMOTEROL TARTRATE 15 MCG/2ML IN NEBU: 15.0000 ug | INHALATION_SOLUTION | Freq: Two times a day (BID) | RESPIRATORY_TRACT | 6 refills | Status: AC

## 2023-08-07 MED ORDER — REVEFENACIN 175 MCG/3ML IN SOLN
175.0000 ug | Freq: Every day | RESPIRATORY_TRACT | 11 refills | Status: AC
Start: 2023-08-07 — End: ?

## 2023-08-07 NOTE — Patient Instructions (Addendum)
 Start flonase  nasal spray, 1 spray per nostril daily  Start ipratropium nasal spray, 2 sprays per nostril twice daily  Start all nebulizer regimen - budesonide  nebs twice daily - brovana  nebs twice daily - yupelri  neb daily  We will order you nebulizer mouth piece and supplies  Continue Xyzal  for allergies and post nasal drainage  Follow up in 2 months, we will consider repeat CT Chest and breathing tests in the future
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
## 2023-08-07 NOTE — Telephone Encounter (Signed)
 Spoke w/ PT about which dme he would like to use

## 2023-08-23 ENCOUNTER — Ambulatory Visit (HOSPITAL_BASED_OUTPATIENT_CLINIC_OR_DEPARTMENT_OTHER): Admitting: Cardiology

## 2023-08-23 ENCOUNTER — Encounter (HOSPITAL_BASED_OUTPATIENT_CLINIC_OR_DEPARTMENT_OTHER): Payer: Self-pay | Admitting: Cardiology

## 2023-08-23 VITALS — BP 142/88 | HR 86 | Ht 67.0 in | Wt 234.6 lb

## 2023-08-23 DIAGNOSIS — N186 End stage renal disease: Secondary | ICD-10-CM

## 2023-08-23 DIAGNOSIS — E1122 Type 2 diabetes mellitus with diabetic chronic kidney disease: Secondary | ICD-10-CM

## 2023-08-23 DIAGNOSIS — I480 Paroxysmal atrial fibrillation: Secondary | ICD-10-CM

## 2023-08-23 DIAGNOSIS — Z992 Dependence on renal dialysis: Secondary | ICD-10-CM

## 2023-08-23 DIAGNOSIS — R079 Chest pain, unspecified: Secondary | ICD-10-CM

## 2023-08-23 DIAGNOSIS — I10 Essential (primary) hypertension: Secondary | ICD-10-CM | POA: Diagnosis not present

## 2023-08-23 DIAGNOSIS — Z794 Long term (current) use of insulin: Secondary | ICD-10-CM

## 2023-08-23 NOTE — Progress Notes (Signed)
 Cardiology Office Note:  .   Date:  08/23/2023  ID:  [name], DOB 02-14-1966, MRN [id number] PCP: Conchita Deem, FNP   HeartCare Providers Cardiologist:  Sheryle Donning, MD {  History of Present Illness: .   [name] is a 57 y.o. male with a hx of ESRD on dialysis, paroxysmal atrial fibrillation, type II diabetes, hypertension, OSA on CPAP, chronic diastolic heart failure, chronic lower extremity wound who is seen for follow up. I initially met him 02/02/20 as a new consult at the request of Jacqulyne Maxim, MD for the evaluation and management of atrial fibrillation.   Cardiac history: Hospitalization at [facility] 11/01/18 where he was diagnosed with atrial fibrillation with RVR. Thinks he has had a heart attack while in [place]  in 1998. No procedures done. Just remembers feeling like he couldn't breathe, EMS told him he was near death.   Follows with Dr. Higinio Love at [facility].  Today: Seen in ER 07/24/23 for chest pain during dialysis. Noted headache, leg cramping, and chest pounding. hsTn mildly elevated but not uptrending, consistent with his prior baseline.   His concern is a constant cough that has not improved despite multiple medication trials. No longer bringing up mucus. No fevers/chills   He notes that he has been having chest pounding, headaches, and cramps with dialysis for "a while." Notes that when he gets to 3.5 hours of his 4 hrs 15 minutes, he starts to feel poorly. He notes sometimes his blood pressure drops, sometimes he gets dizzy/weak, sometimes he vomits at this point. He is trying to get a kidney transplant, so he wants to complete all of his HD time. He has been told he still has a lot of fluid on despite his HD, but he does not feel like he is carrying much extra fluid based on his symptoms. His weight has been stable and down nearly 50 lbs from his peak.  He does not have symptoms other than with dialysis/after dialysis. He does not  feel that the chest discomfort is the main issue, rather the entire constellation of his symptoms. It is not pain as much as his heart pounding, feels dizzy with standing after HD.  Reviewed his last cath in 2022 [facility]), noted only minimal luminal irregularities at that time. This was done for positive stress test (myoview at [facility] with small area of inferior/lateral ischemia).  He already has a stress test scheduled at [facility] for next month as part of his transplant evaluation. Has seen pulmonology for his cough, note from Dr. Diania Fortes reviewed.  Feels rare afib (sense of rapid/irregular beats) at dialysis. Doesn't limit him. He was told that he didn't need to be on apixaban  so he stopped this. Denies bleeding issues.  ROS: Denies shortness of breath at rest or with normal exertion. No PND, orthopnea, LE edema or unexpected weight gain. No syncope. ROS otherwise negative except as noted.   Studies Reviewed: Aaron Aas    EKG:       Physical Exam:   VS:  BP (!) 142/88   Pulse 86   Ht 5\' 7"  (1.702 m)   Wt 234 lb 9.6 oz (106.4 kg)   SpO2 98%   BMI 36.74 kg/m    Wt Readings from Last 3 Encounters:  08/23/23 234 lb 9.6 oz (106.4 kg)  08/07/23 227 lb (103 kg)  07/24/23 221 lb (100.2 kg)    GEN: Well nourished, well developed in no acute distress HEENT: Normal, moist mucous membranes NECK: No  JVD CARDIAC: regular rhythm, normal S1 and S2, no rubs or gallops. No murmur. VASCULAR: Radial and DP pulses 2+ bilaterally. No carotid bruits RESPIRATORY:  Clear to auscultation without rales, wheezing or rhonchi  ABDOMEN: Soft, non-tender, non-distended MUSCULOSKELETAL:  Ambulates independently SKIN: Warm and dry, no edema NEUROLOGIC:  Alert and oriented x 3. No focal neuro deficits noted. PSYCHIATRIC:  Normal affect    ASSESSMENT AND PLAN: .    Headaches, nausea/vomiting, cramping, chest pounding with dialysis -we discussed his ER visit/workup, constellation of symptoms -reviewed his prior cardiac workup  at [facility] -he already has upcoming stress test scheduled at [facility] for his transplant evaluation -symptoms are only with dialysis, near the end of his session. His team has been working to try to optimize these sessions. -there is a question about whether he is retaining a lot of fluid (he has been told he has an extra 20 lbs of fluid per report). He does not have symptoms and feels he is at a good level. We discussed that if there is question about volume status, we could arrange for right heart cath after a dialysis session to determine volume status -reviewed red flag warning signs that need immediate medical attention  Paroxysmal atrial fibrillation: -CHA2DS2/VAS Stroke Risk Points=3  -denies active bleeding.  -apixaban  was stopped, he believes by his nephrologist -reports only rare/brief symptomatic events. Discussed four options today: no DOAC, restart DOAC, short term Zio (discussed this is only 2 weeks, helps us  if we capture significant afib but difficult to exclude based on only 2 week evaluation), vs loop recorder -after shared discussion of options, he would like to see if loop implant is an option for him. Will reach out to EP team regarding this -continue carvedilol    Hypertension: -Management per nephrology, continue amlodipine , carvedilol , hydralazine . Typically well controlled per report   ESRD, on dialysis: -on the [facility] list [facility] kidney [facility]   Type II diabetes No significant CAD on cath (noted as mild luminal irregularities 2022) -on dialysis, no SGLT2i or GLP1RA -on insulin  -data for statins as primary prevention in ESRD is equivocal at best. We discussed previously. No aspirin  if he restarts apixaban   Dispo: 3 mos or sooner as needed  Total time of encounter: I spent 55 minutes dedicated to the care of this patient on the date of this encounter to include pre-visit review of records, face-to-face time with the patient discussing conditions above, and clinical  documentation with the electronic health record. We specifically spent time today discussing ER visit, chest pain, medications, atrial fibrillation/options for management   Signed, Sheryle Donning, MD   Sheryle Donning, MD, PhD, [facility] [place]  [facility]  [place]  Heart & Vascular at [facility] at [facility] [address] [place], [place] [address] [phone]

## 2023-08-23 NOTE — Patient Instructions (Signed)
Medication Instructions:  Your physician recommends that you continue on your current medications as directed. Please refer to the Current Medication list given to you today.   *If you need a refill on your cardiac medications before your next appointment, please call your pharmacy*  Lab Work: NONE   Testing/Procedures: NONE   Follow-Up: At [facility], you and your health needs are our priority.  As part of our continuing mission to provide you with exceptional heart care, we have created designated Provider Care Teams.  These Care Teams include your primary Cardiologist (physician) and Advanced Practice Providers (APPs -  Physician Assistants and Nurse Practitioners) who all work together to provide you with the care you need, when you need it.  We recommend signing up for the patient portal called "MyChart".  Sign up information is provided on this After Visit Summary.  MyChart is used to connect with patients for Virtual Visits (Telemedicine).  Patients are able to view lab/test results, encounter notes, upcoming appointments, etc.  Non-urgent messages can be sent to your provider as well.   To learn more about what you can do with MyChart, go to [email or web address].    Your next appointment:   3 month(s)  The format for your next appointment:   In Person  Provider:   Bridgette Christopher, MD              

## 2023-08-26 ENCOUNTER — Other Ambulatory Visit (HOSPITAL_BASED_OUTPATIENT_CLINIC_OR_DEPARTMENT_OTHER): Payer: Self-pay | Admitting: Cardiology

## 2023-08-26 DIAGNOSIS — I480 Paroxysmal atrial fibrillation: Secondary | ICD-10-CM

## 2023-10-16 ENCOUNTER — Ambulatory Visit: Admitting: Pulmonary Disease

## 2023-10-16 ENCOUNTER — Encounter: Payer: Self-pay | Admitting: Pulmonary Disease
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
## 2023-10-17 NOTE — Progress Notes (Signed)
 Electrophysiology Office Note:   Date:  10/18/2023  ID:  [name], DOB June 06, 1965, MRN [id number]  Primary Cardiologist: [name] Bruckner, MD Primary Heart Failure: None Electrophysiologist: [name] [name] Norton, MD      History of Present Illness:   [name] is a 58 y.o. male with h/o end-stage renal disease on dialysis, atrial fibrillation, diabetes, hypertension, sleep apnea, diastolic heart failure, chronic lower extremity wound seen today for  for Electrophysiology evaluation of atrial fibrillation at the request of [name].    He feels atrial fibrillation, usually during dialysis.  Atrial fibrillation usually does not limit him.  He does feel poorly during dialysis at 3-1/2 out of his 4 hours and 15 minutes.  He feels dizzy, weak, sometimes vomits.  He is currently undergoing kidney transplant evaluation.  Today, denies symptoms of palpitations, chest pain, dyspnea, orthopnea, PND, lower extremity edema, claudication, dizziness, presyncope, syncope, bleeding, or neurologic sequela. The patient is tolerating medications without difficulties.  He has no chest pain or palpitations.  He does get palpitations when he is in dialysis.  He feels an irregular heartbeat.  This rarely happens when he is not getting dialysis.  Review of systems complete and found to be negative unless listed in HPI.   EP Information / Studies Reviewed:    EKG is not ordered today. EKG from 07/24/23 reviewed which showed sinus rhythm        Risk Assessment/Calculations:    CHA2DS2-VASc Score = 2   This indicates a 2.2% annual risk of stroke. The patient's score is based upon:         Physical Exam:   VS:  BP (!) 140/90 (BP Location: Right Arm, Patient Position: Sitting, Cuff Size: Large)   Pulse 82   Ht 5' 7 (1.702 m)   Wt 230 lb 8 oz (104.6 kg)   SpO2 98%   BMI 36.10 kg/m    Wt Readings from Last 3 Encounters:  10/18/23 230 lb 8 oz (104.6 kg)  08/23/23 234 lb 9.6 oz  (106.4 kg)  08/07/23 227 lb (103 kg)     GEN: Well nourished, well developed in no acute distress NECK: No JVD; No carotid bruits CARDIAC: Regular rate and rhythm, no murmurs, rubs, gallops RESPIRATORY:  Clear to auscultation without rales, wheezing or rhonchi  ABDOMEN: Soft, non-tender, non-distended EXTREMITIES:  No edema; No deformity   ASSESSMENT AND PLAN:    1.  Paroxysmal atrial fibrillation: Has brief episodes.  Eliquis  was stopped, patient believes by his nephrologist.  He would benefit from long-term monitoring to determine his atrial fibrillation burden and whether or not anticoagulation would be reasonable.  [name] plan for ILR implant.  Risks and benefits have been discussed.  He understands the risks and is agreed to the procedure.  Risk include bleeding and infection.  2.  Secondary hypercoagulable state: Not on Eliquis  for atrial fibrillation  3.  End-stage renal disease: On dialysis  4.  Hypertension: Managed by nephrology  Follow up with Dr. Norton pending ILR results   Signed, [name] [name] Norton, MD   SURGEON:  [name] [name] Norton, MD     PREPROCEDURE DIAGNOSIS:  Atrial fibrillation    POSTPROCEDURE DIAGNOSIS: Atrial fibrillation     PROCEDURES:   1. Implantable loop recorder implantation    INTRODUCTION:  [name] presents with a history of atrial fibrillation The costs of loop recorder monitoring have been discussed with the patient.    DESCRIPTION OF PROCEDURE:  Informed written consent was obtained.  Time Out Completed with [name]    The patient required no sedation for the procedure today.  Mapping over the patient's chest was performed to identify the area where electrograms were most prominent for ILR recording.  This area was found to be the left parasternal region over the 4th intercostal space. The patients left chest was therefore prepped and draped in the usual sterile fashion. The skin overlying the left parasternal region was infiltrated  with lidocaine  for local analgesia.  A 0.5-cm incision was made over the left parasternal region over the 3rd intercostal space.  A subcutaneous ILR pocket was fashioned using a combination of sharp and blunt dissection.  A [employer]onic Reveal LINQ 2 implantable loop recorder (serial # [id number]) was then placed into the pocket  R waves were very prominent and measuring 0.28.  Steri- Strips and a sterile dressing were then applied.  There were no early apparent complications.     CONCLUSIONS:   1. Successful implantation of a implantable loop recorder for a history of atrial fibrillation.  2. No early apparent complications.   [name] [name] Norton, MD  Cardiac Electrophysiology

## 2023-10-18 ENCOUNTER — Encounter: Payer: Self-pay | Admitting: Cardiology

## 2023-10-18 ENCOUNTER — Ambulatory Visit: Attending: Cardiology | Admitting: Cardiology

## 2023-10-18 VITALS — BP 140/90 | HR 82 | Ht 67.0 in | Wt 230.5 lb

## 2023-10-18 DIAGNOSIS — I480 Paroxysmal atrial fibrillation: Secondary | ICD-10-CM | POA: Diagnosis not present

## 2023-10-18 DIAGNOSIS — I10 Essential (primary) hypertension: Secondary | ICD-10-CM | POA: Diagnosis not present

## 2023-10-18 DIAGNOSIS — D6869 Other thrombophilia: Secondary | ICD-10-CM

## 2023-10-18 NOTE — Patient Instructions (Signed)
Medication Instructions:  Your physician recommends that you continue on your current medications as directed. Please refer to the Current Medication list given to you today.  Labwork: None ordered.  Testing/Procedures: None ordered.  Follow-Up:  Your physician wants you to follow-up as needed with Dr. Camnitz    Implantable Loop Recorder Placement, Care After This sheet gives you information about how to care for yourself after your procedure. Your health care provider may also give you more specific instructions. If you have problems or questions, contact your health care provider. What can I expect after the procedure? After the procedure, it is common to have: Soreness or discomfort near the incision. Some swelling or bruising near the incision.  Follow these instructions at home: Incision care  Monitor your cardiac device site for redness, swelling, and drainage. Call the device clinic at [phone] if you experience these symptoms or fever/chills.  Keep the large square bandage on your site for 24 hours and then you may remove it yourself. Keep the steri-strips underneath in place.   You may shower after 72 hours / 3 days from your procedure with the steri-strips in place. They will usually fall off on their own, or may be removed after 10 days. Pat dry.   Avoid lotions, ointments, or perfumes over your incision until it is well-healed.  Please do not submerge in water until your site is completely healed.   Your device is MRI compatible.   Remote monitoring is used to monitor your cardiac device from home. This monitoring is scheduled every month by our office. It allows us to keep an eye on the function of your device to ensure it is working properly.  If your wound site starts to bleed apply pressure.    For help with the monitor please call [employer] Monitor Support Specialist directly at [phone].    If you have any questions/concerns please call the device  clinic at [phone].  Activity  Return to your normal activities.  General instructions Follow instructions from your health care provider about how to manage your implantable loop recorder and transmit the information. Learn how to activate a recording if this is necessary for your type of device. You may go through a metal detection gate, and you may let someone hold a metal detector over your chest. Show your ID card if needed. Do not have an MRI unless you check with your health care provider first. Take over-the-counter and prescription medicines only as told by your health care provider. Keep all follow-up visits as told by your health care provider. This is important. Contact a health care provider if: You have redness, swelling, or pain around your incision. You have a fever. You have pain that is not relieved by your pain medicine. You have triggered your device because of fainting (syncope) or because of a heartbeat that feels like it is racing, slow, fluttering, or skipping (palpitations). Get help right away if you have: Chest pain. Difficulty breathing. Summary After the procedure, it is common to have soreness or discomfort near the incision. Change your dressing as told by your health care provider. Follow instructions from your health care provider about how to manage your implantable loop recorder and transmit the information. Keep all follow-up visits as told by your health care provider. This is important. This information is not intended to replace advice given to you by your health care provider. Make sure you discuss any questions you have with your health care provider. Document Released: 03/07/2015   Document Revised: 05/11/2017 Document Reviewed: 05/11/2017 Elsevier Patient Education  2020 Elsevier Inc.  

## 2023-11-14 ENCOUNTER — Ambulatory Visit (HOSPITAL_COMMUNITY)
Admission: RE | Admit: 2023-11-14 | Discharge: 2023-11-14 | Disposition: A | Discharge status: Home / Self Care | Attending: Surgery | Admitting: Surgery

## 2023-11-14 ENCOUNTER — Other Ambulatory Visit: Payer: Self-pay

## 2023-11-14 ENCOUNTER — Encounter (HOSPITAL_COMMUNITY)
Admission: RE | Disposition: A | Discharge status: Home / Self Care | Payer: Self-pay | Source: Home / Self Care | Attending: Surgery

## 2023-11-14 ENCOUNTER — Other Ambulatory Visit: Payer: Self-pay | Admitting: *Deleted

## 2023-11-14 DIAGNOSIS — T82510A Breakdown (mechanical) of surgically created arteriovenous fistula, initial encounter: Secondary | ICD-10-CM | POA: Diagnosis not present

## 2023-11-14 DIAGNOSIS — N186 End stage renal disease: Secondary | ICD-10-CM | POA: Insufficient documentation

## 2023-11-14 DIAGNOSIS — I509 Heart failure, unspecified: Secondary | ICD-10-CM | POA: Diagnosis not present

## 2023-11-14 DIAGNOSIS — T82898A Other specified complication of vascular prosthetic devices, implants and grafts, initial encounter: Secondary | ICD-10-CM | POA: Diagnosis not present

## 2023-11-14 DIAGNOSIS — Y832 Surgical operation with anastomosis, bypass or graft as the cause of abnormal reaction of the patient, or of later complication, without mention of misadventure at the time of the procedure: Secondary | ICD-10-CM | POA: Insufficient documentation

## 2023-11-14 DIAGNOSIS — Z992 Dependence on renal dialysis: Secondary | ICD-10-CM | POA: Diagnosis not present

## 2023-11-14 DIAGNOSIS — T82858A Stenosis of vascular prosthetic devices, implants and grafts, initial encounter: Secondary | ICD-10-CM | POA: Diagnosis not present

## 2023-11-14 DIAGNOSIS — E1122 Type 2 diabetes mellitus with diabetic chronic kidney disease: Secondary | ICD-10-CM | POA: Insufficient documentation

## 2023-11-14 DIAGNOSIS — I132 Hypertensive heart and chronic kidney disease with heart failure and with stage 5 chronic kidney disease, or end stage renal disease: Secondary | ICD-10-CM | POA: Insufficient documentation

## 2023-11-14 HISTORY — PX: VENOUS ANGIOPLASTY: CATH118376

## 2023-11-14 HISTORY — PX: A/V SHUNT INTERVENTION: CATH118220

## 2023-11-14 LAB — GLUCOSE, CAPILLARY: Glucose-Capillary: 127 mg/dL — ABNORMAL HIGH (ref 70–99)

## 2023-11-14 SURGERY — A/V SHUNT INTERVENTION
Anesthesia: LOCAL | Site: Arm Upper | Laterality: Left

## 2023-11-14 MED ORDER — LIDOCAINE HCL (PF) 1 % IJ SOLN
INTRAMUSCULAR | Status: DC | PRN
  Administered 2023-11-14: 5 mL

## 2023-11-14 MED ORDER — HEPARIN (PORCINE) IN NACL 1000-0.9 UT/500ML-% IV SOLN
INTRAVENOUS | Status: DC | PRN
  Administered 2023-11-14: 500 mL

## 2023-11-14 MED ORDER — LIDOCAINE HCL (PF) 1 % IJ SOLN
INTRAMUSCULAR | Status: AC
  Filled 2023-11-14: qty 30

## 2023-11-14 MED ORDER — IODIXANOL 320 MG/ML IV SOLN
INTRAVENOUS | Status: DC | PRN
  Administered 2023-11-14: 55 mL via INTRAVENOUS

## 2023-11-14 SURGICAL SUPPLY — 9 items
BALLOON MUSTANG 10X60X75 (BALLOONS) IMPLANT
CATH SLIP KMP 65CM 5FR (CATHETERS) IMPLANT
GLIDEWIRE ADV .035X180CM (WIRE) IMPLANT
KIT ENCORE 26 ADVANTAGE (KITS) IMPLANT
KIT MICROPUNCTURE NIT STIFF (SHEATH) IMPLANT
SHEATH PINNACLE R/O II 6F 4CM (SHEATH) IMPLANT
SHEATH PROBE COVER 6X72 (BAG) IMPLANT
TRAY PV CATH (CUSTOM PROCEDURE TRAY) ×2 IMPLANT
TUBING CIL FLEX 10 FLL-RA (TUBING) IMPLANT

## 2023-11-14 NOTE — H&P (Signed)
 [facility]  Patient name: [name] MRN: [id number] DOB: May 15, 1965 Sex: male   REASON FOR VISIT:    Dialysis access  HISOTRY OF PRESENT ILLNESS:    [name] is a 58 y.o. male who is status post left brachiocephalic fistula by Dr. Eliza on 01/05/2020.  He underwent plication of the fistula on 12/12/2021.  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  This was treated with a 10 mm balloon.  He has been having trouble with flow rates and access.  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 that is becoming very bothersome   PAST MEDICAL HISTORY:   Past Medical History:  Diagnosis Date   Adjustment disorder with mixed anxiety and depressed mood 12/28/2014   Asthma    Bacteremia due to Pseudomonas 12/01/2022   CHF (congestive heart failure) (HCC)    CPAP (continuous positive airway pressure) dependence    Diabetes mellitus without complication (HCC)    DM (diabetes mellitus) type II controlled with renal manifestation (HCC) 12/28/2014   DM (diabetes mellitus) type II controlled, neurological manifestation (HCC) 12/28/2014   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HCC)    Encephalopathy, hypertensive 12/28/2014   ESRD on hemodialysis (HCC)    MWF at [facility] / [facility]   GERD (gastroesophageal reflux disease)    Hypertension    Hypertensive emergency without congestive heart failure 12/27/2014   Pleural effusion    Pneumonia    Possible Panic disorder 12/28/2014   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   Sleep apnea      FAMILY HISTORY:   Family History  Problem Relation Age of Onset   Hypertension Mother    Lung cancer Father     SOCIAL HISTORY:   Social History   Tobacco Use   Smoking status: Never    Passive exposure: Never   Smokeless tobacco: Never  Substance Use Topics   Alcohol use: No     ALLERGIES:   Allergies  Allergen Reactions    Hydrocodone  Itching   Oxycodone  Itching     CURRENT MEDICATIONS:   No current facility-administered medications for this encounter.    REVIEW OF SYSTEMS:   [X]  denotes positive finding, [ ]  denotes negative finding Cardiac  Comments:  Chest pain or chest pressure:    Shortness of breath upon exertion:    Short of breath when lying flat:    Irregular heart rhythm:        Vascular    Pain in calf, thigh, or hip brought on by ambulation:    Pain in feet at night that wakes you up from your sleep:     Blood clot in your veins:    Leg swelling:         Pulmonary    Oxygen at home:    Productive cough:     Wheezing:         Neurologic    Sudden weakness in arms or legs:     Sudden numbness in arms or legs:     Sudden onset of difficulty speaking or slurred speech:    Temporary loss of vision in one eye:     Problems with dizziness:         Gastrointestinal    Blood in stool:     Vomited blood:         Genitourinary    Burning when urinating:     Blood in urine:  Psychiatric    Major depression:         Hematologic    Bleeding problems:    Problems with blood clotting too easily:        Skin    Rashes or ulcers:        Constitutional    Fever or chills:      PHYSICAL EXAM:   Vitals:   11/14/23 0911  BP: (!) 143/86  Pulse: 83  Resp: 12  Temp: 98.1 F (36.7 C)  TempSrc: Oral  SpO2: 98%    GENERAL: The patient is a well-nourished male, in no acute distress. The vital signs are documented above. CARDIAC: There is a regular rate and rhythm.  VASCULAR: Aneurysmal left arm fistula PULMONARY: Non-labored respirations ABDOMEN: Soft and non-tender with normal pitched bowel sounds.  MUSCULOSKELETAL: There are no major deformities or cyanosis. NEUROLOGIC: No focal weakness or paresthesias are detected. SKIN: There are no ulcers or rashes noted. PSYCHIATRIC: The patient has a normal affect.  STUDIES:     MEDICAL ISSUES:   ESRD: We discussed  proceeding with a left arm fistulogram to evaluate for recurrent stenosis.  If found this would be treated.  We also discussed the possibility of performing plication of his aneurysmal fistula.  He is very interested in this because of how much it bothers him.  I told him that because of the length of the aneurysmal section, he would require a catheter while his incisions heal.  I will work on getting this scheduled in the near future.    Malvina Serene CLORE, MD, FACS [facility] [phone] Pager [phone]

## 2023-11-14 NOTE — Op Note (Signed)
    Patient name: [name] MRN: [id number] DOB: Aug 17, 1965 Sex: male  11/14/2023 Pre-operative Diagnosis: ESRD Post-operative diagnosis:  Same Surgeon:  Malvina New Procedure Performed:  1.  Ultrasound-guided access, left cephalic vein  2.  Fistulogram  3.  Peripheral balloon venoplasty x 2    Indications: This is a 58 year old gentleman with aneurysmal fistula with trouble with access and flow rates who comes in today for fistulogram.  Procedure:  The patient was identified in the holding area and taken to room 8.  The patient was then placed supine on the table and prepped and draped in the usual sterile fashion.  A time out was called.  Ultrasound was used to evaluate the fistula.  The vein was patent and compressible.  A digital ultrasound image was acquired.  The fistula was then accessed under ultrasound guidance using a micropuncture needle.  An 018 wire was then asvanced without resistance and a micropuncture sheath was placed.  Contrast injections were then performed through the sheath.  Findings: The central venous system was widely patent.  The cephalic vein arch which had previously been treated had restenosed down to approximately 50%.  There is a stent within the midportion of the fistula that is awkwardly positioned but without any stenosis.  Between the stent and the cephalic vein arch, there was an area of approximately 60% stenosis.  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  No significant arterial stenosis was visualized   Intervention: After the above images were acquired the decision was made to proceed with intervention.  Over a Glidewire advantage, a 6 [place] sheath was placed.  I selected a 10 mm balloon and performed balloon venoplasty of the cephalic vein arch to 10 atm.  I then treated the second lesion with a separate inflation taking the balloon to 12 atm.  Completion imaging revealed improved result with residual stenosis of both areas less than 20%.  Balloon and wires  were removed.  The sheath was removed and a Monocryl was used for closure  Impression:  #1  Successful venoplasty of the cephalic vein arch and cephalic vein in the upper arm using a 10 mm balloon  #2  Fistula remains amenable to future interventions  #3  Patient will be scheduled for aneurysmal plication in the near future    V. Malvina New, M.D., [facility] [place] Office: [phone] Pager:  [phone]

## 2023-11-14 NOTE — H&P (View-Only) (Signed)
 [facility]  Patient name: [name] MRN: [id number] DOB: May 15, 1965 Sex: male   REASON FOR VISIT:    Dialysis access  HISOTRY OF PRESENT ILLNESS:    [name] is a 58 y.o. male who is status post left brachiocephalic fistula by Dr. Eliza on 01/05/2020.  He underwent plication of the fistula on 12/12/2021.  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  This was treated with a 10 mm balloon.  He has been having trouble with flow rates and access.  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 that is becoming very bothersome   PAST MEDICAL HISTORY:   Past Medical History:  Diagnosis Date   Adjustment disorder with mixed anxiety and depressed mood 12/28/2014   Asthma    Bacteremia due to Pseudomonas 12/01/2022   CHF (congestive heart failure) (HCC)    CPAP (continuous positive airway pressure) dependence    Diabetes mellitus without complication (HCC)    DM (diabetes mellitus) type II controlled with renal manifestation (HCC) 12/28/2014   DM (diabetes mellitus) type II controlled, neurological manifestation (HCC) 12/28/2014   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HCC)    Encephalopathy, hypertensive 12/28/2014   ESRD on hemodialysis (HCC)    MWF at [facility]   GERD (gastroesophageal reflux disease)    Hypertension    Hypertensive emergency without congestive heart failure 12/27/2014   Pleural effusion    Pneumonia    Possible Panic disorder 12/28/2014   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   Sleep apnea      FAMILY HISTORY:   Family History  Problem Relation Age of Onset   Hypertension Mother    Lung cancer Father     SOCIAL HISTORY:   Social History   Tobacco Use   Smoking status: Never    Passive exposure: Never   Smokeless tobacco: Never  Substance Use Topics   Alcohol use: No     ALLERGIES:   Allergies  Allergen Reactions    Hydrocodone  Itching   Oxycodone  Itching     CURRENT MEDICATIONS:   No current facility-administered medications for this encounter.    REVIEW OF SYSTEMS:   [X]  denotes positive finding, [ ]  denotes negative finding Cardiac  Comments:  Chest pain or chest pressure:    Shortness of breath upon exertion:    Short of breath when lying flat:    Irregular heart rhythm:        Vascular    Pain in calf, thigh, or hip brought on by ambulation:    Pain in feet at night that wakes you up from your sleep:     Blood clot in your veins:    Leg swelling:         Pulmonary    Oxygen at home:    Productive cough:     Wheezing:         Neurologic    Sudden weakness in arms or legs:     Sudden numbness in arms or legs:     Sudden onset of difficulty speaking or slurred speech:    Temporary loss of vision in one eye:     Problems with dizziness:         Gastrointestinal    Blood in stool:     Vomited blood:         Genitourinary    Burning when urinating:     Blood in urine:  Psychiatric    Major depression:         Hematologic    Bleeding problems:    Problems with blood clotting too easily:        Skin    Rashes or ulcers:        Constitutional    Fever or chills:      PHYSICAL EXAM:   Vitals:   11/14/23 0911  BP: (!) 143/86  Pulse: 83  Resp: 12  Temp: 98.1 F (36.7 C)  TempSrc: Oral  SpO2: 98%    GENERAL: The patient is a well-nourished male, in no acute distress. The vital signs are documented above. CARDIAC: There is a regular rate and rhythm.  VASCULAR: Aneurysmal left arm fistula PULMONARY: Non-labored respirations ABDOMEN: Soft and non-tender with normal pitched bowel sounds.  MUSCULOSKELETAL: There are no major deformities or cyanosis. NEUROLOGIC: No focal weakness or paresthesias are detected. SKIN: There are no ulcers or rashes noted. PSYCHIATRIC: The patient has a normal affect.  STUDIES:     MEDICAL ISSUES:   ESRD: We discussed  proceeding with a left arm fistulogram to evaluate for recurrent stenosis.  If found this would be treated.  We also discussed the possibility of performing plication of his aneurysmal fistula.  He is very interested in this because of how much it bothers him.  I told him that because of the length of the aneurysmal section, he would require a catheter while his incisions heal.  I will work on getting this scheduled in the near future.    Malvina Serene CLORE, MD, FACS [facility] [phone] Pager [phone]

## 2023-11-15 ENCOUNTER — Encounter (HOSPITAL_COMMUNITY): Payer: Self-pay | Admitting: Surgery

## 2023-11-18 ENCOUNTER — Ambulatory Visit: Payer: Self-pay | Admitting: Cardiology

## 2023-11-18 ENCOUNTER — Ambulatory Visit (INDEPENDENT_AMBULATORY_CARE_PROVIDER_SITE_OTHER)

## 2023-11-18 DIAGNOSIS — I480 Paroxysmal atrial fibrillation: Secondary | ICD-10-CM

## 2023-11-18 LAB — CUP PACEART REMOTE DEVICE CHECK
Date Time Interrogation Session: 20250811100324
Implantable Pulse Generator Implant Date: 20250711

## 2023-11-26 ENCOUNTER — Other Ambulatory Visit: Payer: Self-pay

## 2023-11-26 ENCOUNTER — Encounter (HOSPITAL_COMMUNITY): Payer: Self-pay | Admitting: Surgery

## 2023-11-26 NOTE — Progress Notes (Signed)
 SDW call  Patient was given pre-op instructions over the phone. Patient verbalized understanding of instructions provided.     PCP - Glinda Like, FNP Cardiologist - Dr. Shelda Bruckner EP Cardiologist: Dr. Soyla Norton Pulmonary:    PPM/ICD - Patient has a Medtronic Loop Recorder, no remote Device Orders -  Rep Notified -    Chest x-ray - 07/24/2023 EKG -  07/24/2023 Stress Test - ECHO - 06/11/2023 Cardiac Cath -   Sleep Study/sleep apnea/CPAP: OSA with nightly CPAP  Type II diabetic. A1C 6.9 on 08/28/2023 Fasting Blood sugar range: 120's How often check sugars:every other day Unsure of insulin , just knows it is 70/30 15 units if sugar is > 130.  Instructed to take zero units DOS   Blood Thinner Instructions: denies Aspirin  Instructions:denies   ERAS Protcol - NPO  Anesthesia review: Yes. CHF, HTN, DM, OSA with CPAP, ESRD with dialysis , loop recorder   Patient denies shortness of breath, fever, cough and chest pain over the phone call  Your procedure is scheduled on Wednesday November 27, 2023  Report to [facility] Main Entrance A at  0630  A.M., then check in with the Admitting office.  Call this number if you have problems the morning of surgery:  [phone]   If you have any questions prior to your surgery date call [phone]: Open Monday-Friday 8am-4pm If you experience any cold or flu symptoms such as cough, fever, chills, shortness of breath, etc. between now and your scheduled surgery, please notify us  at the above number    Remember:  Do not eat or drink after midnight the night before your surgery  Take these medicines the morning of surgery with A SIP OF WATER:  Amlodipine , brovana  neb, gabapentin , hydroxyzine , protonix , pulmicort  neb, buspar , carvedolol, pepcid , flonase , hydralazine , atrovent  nasal, revefenacin  neb, sensipar , zphozah  As needed: Tylenol , albutero neb/inhaler, zofran , phenergan , zanaflex , tramadol   As of today, STOP taking any  Aspirin  (unless otherwise instructed by your surgeon) Aleve, Naproxen, Ibuprofen, Motrin, Advil, Goody's, BC's, all herbal medications, fish oil, and all vitamins.

## 2023-11-26 NOTE — Progress Notes (Signed)
 Case: [id number] Date/Time: 11/27/23 0815   Procedures:      FISTULA SUPERFICIALIZATION (Left)     INSERTION OF DIALYSIS CATHETER   Anesthesia type: Choice   Diagnosis: End stage renal disease (HCC) [N18.6]   Pre-op diagnosis: ESRD   [facility] / [facility]   Surgeons: Serene Gaile ORN, MD       DISCUSSION: [name] is a 58 yo male with PMH of HTN, hx of MI (1998), HFpEF, A.fib (not anticoagulated), mild MR, OSA on CPAP, asthma, chronic bronchitis, possible ILD, right sided complicated pneumonia and effusion with empyema s/p VATs and decortication in 2019, GERD, DM (A1c 7 on 5/6), ESRD on dialysis MWF, anxiety, depression.  Patient follows with Cardiology for HFpEF and A.fib. Reportedly a remote hx of a MI in 1998 in [place]  (no records available). Cath in 2022 at [facility] after abnormal stress test showed minimal luminal irregularities. Echo in 06/2023 shoed normal LVEF, severe LVH, with grade II DD.  Seen by General Cardiology on 08/23/23 at [facility]. Patient reported chest pain during dialysis. Stress test scheduled for kidney transplant evaluation. This was done 10/01/23 at [facility] which showed medium sized infarct in the circumflex/OM territory and hypokinesis of the inferolateral wall. EF noted to be normal. Seen by EP on 10/18/23 at [facility]. ILR was placed since patient's anticoagulation was previously d/c'd to determine A.fib burden. Device last checked on 11/18/23 and no A.fib episodes noted.  Hx of ESRD. Currently dialyzing MWF via L brachiocephalic fistula since 2021. Underwent fistulogram on 8/7 and had balloon venoplasty of the cephalic vein x 2. Now scheduled for surgery above.   Undergoing kidney tranplant eval at [facility]. Last seen 08/13/23. Pt advised to f/u with Pulmonology and undergo stress test, echo.  Pt follows with Pulmonology at [facility] for chronic cough and DOE. Restrictive defect and decreased diffusion capacity on PFTs done at [facility]. He was prescribed inhalers and nebs and  advised to obtain repeat PFTs and CT Chest (done at [facility] on 10/01/23) which showed enlarged intrathoracic lymph nodes and new pulmonary nodules in R lung base with ground glass airspace opacities. Repeat CT chest recommended in 3 months (due ~01/01/24).  Discussed case with Dr. Merla. Patient needs cardiac clearance due to abnormal stress test. Vascular office aware.   VS: Ht 5' 7 (1.702 m)   Wt 104.3 kg   BMI 36.02 kg/m   PROVIDERS: Rik Glinda DASEN, FNP Cardiologist - Dr. Shelda Bruckner EP Cardiologist: Dr. Soyla Norton  LABS: Obtain DOS   IMAGES:  CT Chest 10/01/23 ([facility]):  Impression 1.  Enlarged intrathoracic lymph nodes as above. New subcentimeter pulmonary nodules at the right lung base. Scattered areas of groundglass airspace opacities. Constellation of findings is most suggestive of postinfectious etiology, including atypical infections. Recommend follow-up CT chest in 3 months to ensure resolution. 2.  Additional ancillary findings as above.  EKG 07/24/23:  Sinus rhythm, rate 87 Probable left atrial enlargement Probable anteroseptal infarct, olda  CV:  NM Stress test 10/01/23 ([facility]):  Impression 1.) LIMITATIONS: None. 2.) MYOCARDIAL PERFUSION: Medium-sized infarct in the circumflex/OM territory. 3.) LEFT VENTRICULAR EJECTION FRACTION: Normal. 4.) REGIONAL WALL MOTION: Hypokinesis of the inferolateral wall.  Echo 06/11/2023:  IMPRESSIONS    1. Left ventricular ejection fraction, by estimation, is 60 to 65%. The left ventricle has normal function. The left ventricle has no regional wall motion abnormalities. There is severe concentric left ventricular hypertrophy. Left ventricular diastolic  parameters are consistent with Grade II diastolic dysfunction (  pseudonormalization). The average left ventricular global longitudinal strain is -19.4 %. The global longitudinal strain is normal.  2. Right ventricular systolic function is normal. The right  ventricular size is mildly enlarged. There is normal pulmonary artery systolic pressure. The estimated right ventricular systolic pressure is 33.5 mmHg.  3. Left atrial size was mildly dilated.  4. The mitral valve is degenerative. Mild mitral valve regurgitation. No evidence of mitral stenosis. Moderate mitral annular calcification.  5. The aortic valve is tricuspid. Aortic valve regurgitation is not visualized. No aortic stenosis is present.  6. There is mild dilatation of the ascending aorta, measuring 41 mm.  7. The inferior vena cava is normal in size with greater than 50% respiratory variability, suggesting right atrial pressure of 3 mmHg.    LHC 12/07/2020 ([facility]):  Coronary Angiography Findings --LM  Normal --LAD  Minimal luminal irregularities --LCx  Minimal luminal irregularities --RCA  Minimal luminal irregularities --LVEDP  15 mmHg  Comments  LCP via right radial access for positive stress test. Coronary angiogram showed widely patent coronaries. AV crossed, LVEDP 15.   Past Medical History:  Diagnosis Date   Adjustment disorder with mixed anxiety and depressed mood 12/28/2014   Asthma    Bacteremia due to Pseudomonas 12/01/2022   CHF (congestive heart failure) (HCC)    CPAP (continuous positive airway pressure) dependence    Diabetes mellitus without complication (HCC)    DM (diabetes mellitus) type II controlled with renal manifestation (HCC) 12/28/2014   DM (diabetes mellitus) type II controlled, neurological manifestation (HCC) 12/28/2014   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HCC)    Encephalopathy, hypertensive 12/28/2014   ESRD on hemodialysis (HCC)    MWF at [facility] / [facility]   GERD (gastroesophageal reflux disease)    Hypertension    Hypertensive emergency without congestive heart failure 12/27/2014   Pleural effusion    Pneumonia    Possible Panic disorder 12/28/2014   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   Sleep apnea     Past Surgical History:  Procedure Laterality Date    A/V FISTULAGRAM N/A 05/23/2023   Procedure: A/V Fistulagram;  Surgeon: Norine Manuelita LABOR, MD;  [facility]: [facility];  Service: Cardiovascular;  Laterality: N/A;   A/V SHUNT INTERVENTION Left 11/14/2023   Procedure: A/V SHUNT INTERVENTION;  Surgeon: Serene Gaile ORN, MD;  Location: [facility] PV LAB;  Service: Cardiovascular;  Laterality: Left;   AV FISTULA PLACEMENT Left 01/05/2020   Procedure: LEFT ARM BRACHIOCEPHALIC ARTERIOVENOUS (AV) FISTULA;  Surgeon: Eliza Lonni RAMAN, MD;  Location: [facility] OR;  Service: Vascular;  Laterality: Left;   EMPYEMA DRAINAGE Right 05/17/2017   Procedure: EMPYEMA DRAINAGE AND DECORTICATION;  Surgeon: Army Dallas NOVAK, MD;  Location: [facility] OR;  Service: Thoracic;  Laterality: Right;   FLEXIBLE BRONCHOSCOPY  05/17/2017   Procedure: FLEXIBLE BRONCHOSCOPY;  Surgeon: Army Dallas NOVAK, MD;  Location: [facility] OR;  Service: Thoracic;;   INSERTION OF DIALYSIS CATHETER Right 12/12/2021   Procedure: INSERTION OF RIGHT INTERNAL JUGULAR DIALYSIS CATHETER;  Surgeon: Eliza Lonni RAMAN, MD;  Location: [facility] OR;  Service: Vascular;  Laterality: Right;   IR FLUORO GUIDE CV LINE RIGHT  01/01/2020   IR THORACENTESIS ASP PLEURAL SPACE W/IMG GUIDE  05/17/2017   IR US  GUIDE VASC ACCESS RIGHT  01/01/2020   PERIPHERAL VASCULAR BALLOON ANGIOPLASTY  05/23/2023   Procedure: PERIPHERAL VASCULAR BALLOON ANGIOPLASTY;  Surgeon: Norine Manuelita LABOR, MD;  [facility];  Service: Cardiovascular;;   REVISON OF ARTERIOVENOUS FISTULA Left 12/12/2021   Procedure: REVISION OF LEFT  ARM FISTULA BY PLICATION;  Surgeon: Eliza Lonni RAMAN, MD;  Location: [facility] OR;  Service: Vascular;  Laterality: Left;   THORACOTOMY/LOBECTOMY Right 05/17/2017   Procedure: RIGHT MINI THORACOTOMY;  Surgeon: Army Dallas NOVAK, MD;  Location: [facility] OR;  Service: Thoracic;  Laterality: Right;   VENOUS ANGIOPLASTY  11/14/2023   Procedure: VENOUS ANGIOPLASTY;  Surgeon: Serene Gaile ORN, MD;  Location: [facility];   Service: Cardiovascular;;   VIDEO ASSISTED THORACOSCOPY (VATS)/EMPYEMA Right 05/17/2017   Procedure: RIGHT VIDEO ASSISTED THORACOSCOPY (VATS)/EMPYEMA;  Surgeon: Army Dallas NOVAK, MD;  Location: [facility] OR;  Service: Thoracic;  Laterality: Right;    MEDICATIONS: No current facility-administered medications for this encounter.    Accu-Chek Softclix Lancets lancets   acetaminophen  (TYLENOL ) 325 MG tablet   albuterol  (PROVENTIL ) (2.5 MG/3ML) 0.083% nebulizer solution   albuterol  (VENTOLIN  HFA) 108 (90 Base) MCG/ACT inhaler   amLODipine  (NORVASC ) 10 MG tablet   arformoterol  (BROVANA ) 15 MCG/2ML NEBU   benzonatate  (TESSALON ) 100 MG capsule   budesonide  (PULMICORT ) 0.5 MG/2ML nebulizer solution   busPIRone  (BUSPAR ) 5 MG tablet   calcitRIOL  (ROCALTROL ) 0.25 MCG capsule   camphor-menthol  (SARNA) lotion   carvedilol  (COREG ) 25 MG tablet   famotidine  (PEPCID ) 10 MG tablet   fluticasone  (FLONASE ) 50 MCG/ACT nasal spray   gabapentin  (NEURONTIN ) 300 MG capsule   glucose blood test strip   hydrALAZINE  (APRESOLINE ) 50 MG tablet   hydrOXYzine  (ATARAX ) 25 MG tablet   ipratropium (ATROVENT ) 0.03 % nasal spray   Iron Sucrose  (VENOFER  IV)   levocetirizine (XYZAL ) 5 MG tablet   lidocaine  (XYLOCAINE ) 2 % jelly   multivitamin (RENA-VIT) TABS tablet   ondansetron  (ZOFRAN -ODT) 4 MG disintegrating tablet   pantoprazole  (PROTONIX ) 40 MG tablet   polyethylene glycol powder (GLYCOLAX /MIRALAX ) 17 GM/SCOOP powder   promethazine  (PHENERGAN ) 6.25 MG/5ML solution   revefenacin  (YUPELRI ) 175 MCG/3ML nebulizer solution   SENSIPAR  60 MG tablet   sevelamer  carbonate (RENVELA ) 800 MG tablet   tiZANidine  (ZANAFLEX ) 4 MG tablet   traMADol  (ULTRAM ) 50 MG tablet   Vitamin D , Ergocalciferol , (DRISDOL ) 1.25 MG (50000 UNIT) CAPS capsule   XPHOZAH  30 MG TABS

## 2023-11-27 ENCOUNTER — Other Ambulatory Visit: Payer: Self-pay

## 2023-11-27 ENCOUNTER — Ambulatory Visit (HOSPITAL_COMMUNITY): Admitting: Medical

## 2023-11-27 ENCOUNTER — Ambulatory Visit (HOSPITAL_COMMUNITY)
Admission: RE | Admit: 2023-11-27 | Discharge: 2023-11-27 | Disposition: A | Discharge status: Home / Self Care | Attending: Surgery | Admitting: Surgery

## 2023-11-27 ENCOUNTER — Encounter (HOSPITAL_COMMUNITY)
Admission: RE | Disposition: A | Discharge status: Home / Self Care | Payer: Self-pay | Source: Home / Self Care | Attending: Surgery

## 2023-11-27 ENCOUNTER — Ambulatory Visit (HOSPITAL_COMMUNITY)

## 2023-11-27 ENCOUNTER — Encounter (HOSPITAL_COMMUNITY): Payer: Self-pay | Admitting: Surgery

## 2023-11-27 ENCOUNTER — Ambulatory Visit (HOSPITAL_BASED_OUTPATIENT_CLINIC_OR_DEPARTMENT_OTHER): Admitting: Medical

## 2023-11-27 ENCOUNTER — Other Ambulatory Visit (HOSPITAL_COMMUNITY): Payer: Self-pay

## 2023-11-27 DIAGNOSIS — E1122 Type 2 diabetes mellitus with diabetic chronic kidney disease: Secondary | ICD-10-CM | POA: Insufficient documentation

## 2023-11-27 DIAGNOSIS — T82898A Other specified complication of vascular prosthetic devices, implants and grafts, initial encounter: Secondary | ICD-10-CM | POA: Diagnosis not present

## 2023-11-27 DIAGNOSIS — I132 Hypertensive heart and chronic kidney disease with heart failure and with stage 5 chronic kidney disease, or end stage renal disease: Secondary | ICD-10-CM | POA: Diagnosis not present

## 2023-11-27 DIAGNOSIS — I4891 Unspecified atrial fibrillation: Secondary | ICD-10-CM | POA: Diagnosis not present

## 2023-11-27 DIAGNOSIS — I252 Old myocardial infarction: Secondary | ICD-10-CM | POA: Diagnosis not present

## 2023-11-27 DIAGNOSIS — N186 End stage renal disease: Secondary | ICD-10-CM | POA: Insufficient documentation

## 2023-11-27 DIAGNOSIS — G4730 Sleep apnea, unspecified: Secondary | ICD-10-CM | POA: Insufficient documentation

## 2023-11-27 DIAGNOSIS — I480 Paroxysmal atrial fibrillation: Secondary | ICD-10-CM | POA: Diagnosis not present

## 2023-11-27 DIAGNOSIS — I5032 Chronic diastolic (congestive) heart failure: Secondary | ICD-10-CM | POA: Diagnosis not present

## 2023-11-27 DIAGNOSIS — J45909 Unspecified asthma, uncomplicated: Secondary | ICD-10-CM | POA: Diagnosis not present

## 2023-11-27 DIAGNOSIS — Z992 Dependence on renal dialysis: Secondary | ICD-10-CM | POA: Diagnosis not present

## 2023-11-27 DIAGNOSIS — K219 Gastro-esophageal reflux disease without esophagitis: Secondary | ICD-10-CM | POA: Insufficient documentation

## 2023-11-27 HISTORY — PX: REVISON OF ARTERIOVENOUS FISTULA: SHX6074

## 2023-11-27 HISTORY — PX: INSERTION OF DIALYSIS CATHETER: SHX1324

## 2023-11-27 LAB — POCT I-STAT, CHEM 8
BUN: 58 mg/dL — ABNORMAL HIGH (ref 6–20)
Calcium, Ion: 1.04 mmol/L — ABNORMAL LOW (ref 1.15–1.40)
Chloride: 93 mmol/L — ABNORMAL LOW (ref 98–111)
Creatinine, Ser: 13.1 mg/dL — ABNORMAL HIGH (ref 0.61–1.24)
Glucose, Bld: 128 mg/dL — ABNORMAL HIGH (ref 70–99)
HCT: 38 % — ABNORMAL LOW (ref 39.0–52.0)
Hemoglobin: 12.9 g/dL — ABNORMAL LOW (ref 13.0–17.0)
Potassium: 4.3 mmol/L (ref 3.5–5.1)
Sodium: 132 mmol/L — ABNORMAL LOW (ref 135–145)
TCO2: 27 mmol/L (ref 22–32)

## 2023-11-27 LAB — GLUCOSE, CAPILLARY
Glucose-Capillary: 136 mg/dL — ABNORMAL HIGH (ref 70–99)
Glucose-Capillary: 125 mg/dL — ABNORMAL HIGH (ref 70–99)

## 2023-11-27 SURGERY — REVISON OF ARTERIOVENOUS FISTULA
Anesthesia: General | Site: Arm Upper

## 2023-11-27 MED ORDER — CHLORHEXIDINE GLUCONATE 4 % EX SOLN: 60.0000 mL | Freq: Once | CUTANEOUS | Status: DC

## 2023-11-27 MED ORDER — ONDANSETRON HCL 4 MG/2ML IJ SOLN: 4.0000 mg | Freq: Once | INTRAMUSCULAR | Status: DC | PRN

## 2023-11-27 MED ORDER — HYDROMORPHONE HCL 1 MG/ML IJ SOLN
INTRAMUSCULAR | Status: AC
  Filled 2023-11-27: qty 1

## 2023-11-27 MED ORDER — MIDAZOLAM HCL 2 MG/2ML IJ SOLN
INTRAMUSCULAR | Status: DC | PRN
  Administered 2023-11-27: 2 mg via INTRAVENOUS

## 2023-11-27 MED ORDER — PROPOFOL 10 MG/ML IV BOLUS
INTRAVENOUS | Status: AC
  Filled 2023-11-27: qty 20

## 2023-11-27 MED ORDER — LIDOCAINE 2% (20 MG/ML) 5 ML SYRINGE
INTRAMUSCULAR | Status: AC
  Filled 2023-11-27: qty 5

## 2023-11-27 MED ORDER — LIDOCAINE 2% (20 MG/ML) 5 ML SYRINGE
INTRAMUSCULAR | Status: DC | PRN
  Administered 2023-11-27: 60 mg via INTRAVENOUS

## 2023-11-27 MED ORDER — SUGAMMADEX SODIUM 200 MG/2ML IV SOLN
INTRAVENOUS | Status: DC | PRN
  Administered 2023-11-27: 200 mg via INTRAVENOUS

## 2023-11-27 MED ORDER — 0.9 % SODIUM CHLORIDE (POUR BTL) OPTIME
TOPICAL | Status: DC | PRN
Start: 2023-11-27 — End: 2023-11-27
  Administered 2023-11-27: 1000 mL

## 2023-11-27 MED ORDER — PROPOFOL 10 MG/ML IV BOLUS
INTRAVENOUS | Status: DC | PRN
  Administered 2023-11-27: 200 mg via INTRAVENOUS

## 2023-11-27 MED ORDER — ACETAMINOPHEN 10 MG/ML IV SOLN
INTRAVENOUS | Status: AC
  Filled 2023-11-27: qty 100

## 2023-11-27 MED ORDER — HEPARIN SODIUM (PORCINE) 1000 UNIT/ML IJ SOLN
INTRAMUSCULAR | Status: AC
  Filled 2023-11-27: qty 10

## 2023-11-27 MED ORDER — CHLORHEXIDINE GLUCONATE 0.12 % MT SOLN
15.0000 mL | OROMUCOSAL | Status: AC
  Administered 2023-11-27: 15 mL via OROMUCOSAL
  Filled 2023-11-27 (×2): qty 15

## 2023-11-27 MED ORDER — INSULIN ASPART 100 UNIT/ML IJ SOLN: 0.0000 [IU] | INTRAMUSCULAR | Status: DC | PRN

## 2023-11-27 MED ORDER — SODIUM CHLORIDE 0.9 % IV SOLN: INTRAVENOUS | Status: DC

## 2023-11-27 MED ORDER — MEPERIDINE HCL 25 MG/ML IJ SOLN: 6.2500 mg | INTRAMUSCULAR | Status: DC | PRN

## 2023-11-27 MED ORDER — DEXAMETHASONE SODIUM PHOSPHATE 10 MG/ML IJ SOLN
INTRAMUSCULAR | Status: DC | PRN
  Administered 2023-11-27: 10 mg via INTRAVENOUS

## 2023-11-27 MED ORDER — CEFAZOLIN SODIUM-DEXTROSE 2-4 GM/100ML-% IV SOLN
2.0000 g | INTRAVENOUS | Status: AC
  Administered 2023-11-27: 2 g via INTRAVENOUS
  Filled 2023-11-27: qty 100

## 2023-11-27 MED ORDER — FENTANYL CITRATE (PF) 250 MCG/5ML IJ SOLN
INTRAMUSCULAR | Status: DC | PRN
  Administered 2023-11-27 (×2): 50 ug via INTRAVENOUS
  Administered 2023-11-27: 100 ug via INTRAVENOUS
  Administered 2023-11-27: 50 ug via INTRAVENOUS

## 2023-11-27 MED ORDER — SURGIFLO WITH THROMBIN (HEMOSTATIC MATRIX KIT) OPTIME
TOPICAL | Status: DC | PRN
Start: 2023-11-27 — End: 2023-11-27
  Administered 2023-11-27: 1 via TOPICAL

## 2023-11-27 MED ORDER — METOPROLOL TARTRATE 5 MG/5ML IV SOLN
INTRAVENOUS | Status: DC | PRN
Start: 2023-11-27 — End: 2023-11-27
  Administered 2023-11-27: 1 mg via INTRAVENOUS
  Administered 2023-11-27 (×2): 1.5 mg via INTRAVENOUS
  Administered 2023-11-27: 1 mg via INTRAVENOUS

## 2023-11-27 MED ORDER — LIDOCAINE-EPINEPHRINE (PF) 1 %-1:200000 IJ SOLN
INTRAMUSCULAR | Status: AC
  Filled 2023-11-27: qty 30

## 2023-11-27 MED ORDER — FENTANYL CITRATE (PF) 250 MCG/5ML IJ SOLN
INTRAMUSCULAR | Status: AC
  Filled 2023-11-27: qty 5

## 2023-11-27 MED ORDER — ONDANSETRON HCL 4 MG/2ML IJ SOLN
INTRAMUSCULAR | Status: AC
Start: 2023-11-27 — End: 2023-11-27
  Filled 2023-11-27: qty 2

## 2023-11-27 MED ORDER — ROCURONIUM BROMIDE 10 MG/ML (PF) SYRINGE
PREFILLED_SYRINGE | INTRAVENOUS | Status: DC | PRN
  Administered 2023-11-27: 10 mg via INTRAVENOUS
  Administered 2023-11-27: 30 mg via INTRAVENOUS

## 2023-11-27 MED ORDER — MIDAZOLAM HCL 2 MG/2ML IJ SOLN
INTRAMUSCULAR | Status: AC
  Filled 2023-11-27: qty 2

## 2023-11-27 MED ORDER — TRAMADOL HCL 50 MG PO TABS
50.0000 mg | ORAL_TABLET | Freq: Four times a day (QID) | ORAL | 0 refills | Status: DC | PRN
  Filled 2023-11-27: qty 15, 4d supply, fill #0

## 2023-11-27 MED ORDER — ACETAMINOPHEN 10 MG/ML IV SOLN
1000.0000 mg | Freq: Four times a day (QID) | INTRAVENOUS | Status: DC
  Administered 2023-11-27: 1000 mg via INTRAVENOUS

## 2023-11-27 MED ORDER — HEPARIN SODIUM (PORCINE) 1000 UNIT/ML IJ SOLN
INTRAMUSCULAR | Status: DC | PRN
  Administered 2023-11-27: 3200 [IU] via INTRAVENOUS

## 2023-11-27 MED ORDER — PHENYLEPHRINE HCL-NACL 20-0.9 MG/250ML-% IV SOLN
INTRAVENOUS | Status: DC | PRN
  Administered 2023-11-27: 20 ug/min via INTRAVENOUS

## 2023-11-27 MED ORDER — ACETAMINOPHEN 500 MG PO TABS
1000.0000 mg | ORAL_TABLET | Freq: Once | ORAL | Status: AC
  Administered 2023-11-27: 1000 mg via ORAL
  Filled 2023-11-27: qty 2

## 2023-11-27 MED ORDER — HYDROMORPHONE HCL 1 MG/ML IJ SOLN
0.2500 mg | INTRAMUSCULAR | Status: DC | PRN
  Administered 2023-11-27: 0.25 mg via INTRAVENOUS
  Administered 2023-11-27: 0.5 mg via INTRAVENOUS
  Administered 2023-11-27: 0.25 mg via INTRAVENOUS

## 2023-11-27 MED ORDER — DEXAMETHASONE SODIUM PHOSPHATE 10 MG/ML IJ SOLN
INTRAMUSCULAR | Status: AC
  Filled 2023-11-27: qty 1

## 2023-11-27 MED ORDER — ONDANSETRON HCL 4 MG/2ML IJ SOLN
INTRAMUSCULAR | Status: DC | PRN
  Administered 2023-11-27: 4 mg via INTRAVENOUS

## 2023-11-27 MED ORDER — HEPARIN 6000 UNIT IRRIGATION SOLUTION
Status: DC | PRN
  Administered 2023-11-27: 1

## 2023-11-27 MED ORDER — SUCCINYLCHOLINE CHLORIDE 200 MG/10ML IV SOSY
PREFILLED_SYRINGE | INTRAVENOUS | Status: DC | PRN
  Administered 2023-11-27: 150 mg via INTRAVENOUS

## 2023-11-27 MED ORDER — HEPARIN 6000 UNIT IRRIGATION SOLUTION
Status: AC
Start: 2023-11-27 — End: 2023-11-27
  Filled 2023-11-27: qty 500

## 2023-11-27 MED ORDER — PHENYLEPHRINE 80 MCG/ML (10ML) SYRINGE FOR IV PUSH (FOR BLOOD PRESSURE SUPPORT)
PREFILLED_SYRINGE | INTRAVENOUS | Status: DC | PRN
  Administered 2023-11-27 (×5): 80 ug via INTRAVENOUS

## 2023-11-27 SURGICAL SUPPLY — 54 items
ARMBAND PINK RESTRICT EXTREMIT (MISCELLANEOUS) ×3 IMPLANT
BAG COUNTER SPONGE SURGICOUNT (BAG) ×3 IMPLANT
BAG DECANTER FOR FLEXI CONT (MISCELLANEOUS) ×3 IMPLANT
BIOPATCH RED 1 DISK 7.0 (GAUZE/BANDAGES/DRESSINGS) ×3 IMPLANT
BNDG ELASTIC 4INX 5YD STR LF (GAUZE/BANDAGES/DRESSINGS) ×1 IMPLANT
BNDG ELASTIC 6INX 5YD STR LF (GAUZE/BANDAGES/DRESSINGS) ×1 IMPLANT
BNDG GAUZE DERMACEA FLUFF 4 (GAUZE/BANDAGES/DRESSINGS) ×1 IMPLANT
CANISTER SUCTION 3000ML PPV (SUCTIONS) ×3 IMPLANT
CATH PALINDROME-P 19CM W/VT (CATHETERS) IMPLANT
CATH PALINDROME-P 23 W/VT (CATHETERS) ×1 IMPLANT
CATH PALINDROME-P 28CM W/VT (CATHETERS) IMPLANT
CLIP TI MEDIUM 6 (CLIP) ×3 IMPLANT
CLIP TI WIDE RED SMALL 6 (CLIP) ×3 IMPLANT
COVER PROBE W GEL 5X96 (DRAPES) ×3 IMPLANT
COVER SURGICAL LIGHT HANDLE (MISCELLANEOUS) ×3 IMPLANT
DERMABOND ADVANCED .7 DNX12 (GAUZE/BANDAGES/DRESSINGS) ×3 IMPLANT
DRAPE C-ARM 42X72 X-RAY (DRAPES) ×3 IMPLANT
DRAPE CHEST BREAST 15X10 FENES (DRAPES) ×3 IMPLANT
DRSG ADAPTIC 3X8 NADH LF (GAUZE/BANDAGES/DRESSINGS) ×1 IMPLANT
DRSG EMULSION OIL 3X3 NADH (GAUZE/BANDAGES/DRESSINGS) ×1 IMPLANT
ELECTRODE REM PT RTRN 9FT ADLT (ELECTROSURGICAL) ×3 IMPLANT
GAUZE 4X4 16PLY ~~LOC~~+RFID DBL (SPONGE) ×3 IMPLANT
GAUZE SPONGE 4X4 12PLY STRL (GAUZE/BANDAGES/DRESSINGS) ×1 IMPLANT
GLOVE SURG SS PI 7.5 STRL IVOR (GLOVE) ×9 IMPLANT
GOWN STRL REUS W/ TWL LRG LVL3 (GOWN DISPOSABLE) ×6 IMPLANT
GOWN STRL REUS W/ TWL XL LVL3 (GOWN DISPOSABLE) ×3 IMPLANT
HEMOSTAT SNOW SURGICEL 2X4 (HEMOSTASIS) IMPLANT
KIT BASIN OR (CUSTOM PROCEDURE TRAY) ×3 IMPLANT
KIT PALINDROME-P 55CM (CATHETERS) IMPLANT
KIT TURNOVER KIT B (KITS) ×3 IMPLANT
NDL 18GX1X1/2 (RX/OR ONLY) (NEEDLE) ×2 IMPLANT
NDL HYPO 25GX1X1/2 BEV (NEEDLE) ×2 IMPLANT
NEEDLE 18GX1X1/2 (RX/OR ONLY) (NEEDLE) ×3 IMPLANT
NEEDLE HYPO 25GX1X1/2 BEV (NEEDLE) ×3 IMPLANT
NS IRRIG 1000ML POUR BTL (IV SOLUTION) ×3 IMPLANT
PACK CV ACCESS (CUSTOM PROCEDURE TRAY) ×3 IMPLANT
PACK SRG BSC III STRL LF ECLPS (CUSTOM PROCEDURE TRAY) ×3 IMPLANT
PAD ARMBOARD POSITIONER FOAM (MISCELLANEOUS) ×6 IMPLANT
SET MICROPUNCTURE 5F STIFF (MISCELLANEOUS) ×1 IMPLANT
SLING ARM FOAM STRAP LRG (SOFTGOODS) IMPLANT
SOAP 2 % CHG 4 OZ (WOUND CARE) ×3 IMPLANT
SURGIFLO W/THROMBIN 8M KIT (HEMOSTASIS) ×1 IMPLANT
SUT ETHILON 3 0 PS 1 (SUTURE) ×4 IMPLANT
SUT PROLENE 6 0 CC (SUTURE) ×3 IMPLANT
SUT VIC AB 3-0 SH 27X BRD (SUTURE) ×5 IMPLANT
SUT VIC AB 4-0 PS2 18 (SUTURE) ×3 IMPLANT
SYR 10ML LL (SYRINGE) ×3 IMPLANT
SYR 20ML LL LF (SYRINGE) ×6 IMPLANT
SYR 5ML LL (SYRINGE) ×3 IMPLANT
SYR CONTROL 10ML LL (SYRINGE) ×3 IMPLANT
TOWEL GREEN STERILE (TOWEL DISPOSABLE) ×6 IMPLANT
TOWEL GREEN STERILE FF (TOWEL DISPOSABLE) ×3 IMPLANT
UNDERPAD 30X36 HEAVY ABSORB (UNDERPADS AND DIAPERS) ×3 IMPLANT
WATER STERILE IRR 1000ML POUR (IV SOLUTION) ×3 IMPLANT

## 2023-11-27 NOTE — Anesthesia Preprocedure Evaluation (Addendum)
 Anesthesia Evaluation  Patient identified by MRN, date of birth, ID band Patient awake    Reviewed: Allergy  & Precautions, H&P , NPO status , Patient's Chart, lab work & pertinent test results  Airway Mallampati: IV  TM Distance: >3 FB Neck ROM: Full    Dental no notable dental hx. (+) Dental Advisory Given, Poor Dentition   Pulmonary asthma , sleep apnea  Very likely OSA, has not had sleep study yet   Pulmonary exam normal breath sounds clear to auscultation       Cardiovascular hypertension, Normal cardiovascular exam Rhythm:Regular Rate:Normal   follows with Cardiology for HFpEF and A.fib. Reportedly a remote hx of a MI in 1998 in [place]  (no records available). Cath in 2022 at [facility] after abnormal stress test showed minimal luminal irregularities. Echo in 06/2023 shoed normal LVEF, severe LVH, with grade II DD.  Seen by General Cardiology on 08/23/23 at [facility]. Patient reported chest pain during dialysis. Stress test scheduled for kidney transplant evaluation. This was done 10/01/23 at [facility] which showed medium sized infarct in the circumflex/OM territory and hypokinesis of the inferolateral wall. EF noted to be normal. Seen by EP on 10/18/23 at [facility]. ILR was placed since patient's anticoagulation was previously d/c'd to determine A.fib burden. Device last checked on 11/18/23 and no A.fib episodes noted.  Has not seen cards since 10/06/23 stress test    Neuro/Psych  PSYCHIATRIC DISORDERS Anxiety Depression    negative neurological ROS     GI/Hepatic Neg liver ROS,GERD  Controlled,,  Endo/Other  diabetes  BMI 36  Renal/GU ESRF and DialysisRenal diseaseHD MWF K 4.3 this AM  negative genitourinary   Musculoskeletal negative musculoskeletal ROS (+)    Abdominal  (+) + obese  Peds negative pediatric ROS (+)  Hematology  (+) Blood dyscrasia, anemia Hb 12.9   Anesthesia Other Findings   Reproductive/Obstetrics negative OB  ROS                              Anesthesia Physical Anesthesia Plan  ASA: 4  Anesthesia Plan: General   Post-op Pain Management: Tylenol  PO (pre-op)*   Induction: Intravenous  PONV Risk Score and Plan: 2 and Ondansetron , Dexamethasone , Treatment may vary due to age or medical condition and Midazolam   Airway Management Planned: Oral ETT and Video Laryngoscope Planned  Additional Equipment: ClearSight  Intra-op Plan:   Post-operative Plan: Extubation in OR  Informed Consent: I have reviewed the patients History and Physical, chart, labs and discussed the procedure including the risks, benefits and alternatives for the proposed anesthesia with the patient or authorized representative who has indicated his/her understanding and acceptance.     Dental advisory given  Plan Discussed with: CRNA  Anesthesia Plan Comments: (Long d/w patient and family about new stress test findings showing old area of infarct and also new area- has not seen cards since then. When case was brought to me by PAT yesterday we had decided it wuold be best if patient saw cards again to interpret new stress test findings prior to elective surgery (as fistula is still functional). He does have a clean cath from 2022 (minimal obstructive CAD), so I would be surprised if he has any new significant obstructions since then. I discussed with patient and family that there is a chance of perioperative MI, but given his recent cath I would feel comfortable moving forward. Patient wishes to proceed as planned. Will use clearsight and plan to keep  blood pressures stable throughout. Did not take coreg  this AM- will titrate in beta blocker as tolerated intra-op.   D/w pt he needs to schedule f/u with cards to discuss new stress test findings. )         Anesthesia Quick Evaluation

## 2023-11-27 NOTE — Anesthesia Procedure Notes (Cosign Needed Addendum)
 Procedure Name: Intubation Date/Time: 11/27/2023 8:42 AM  Performed by: Merla Almarie HERO, DOPre-anesthesia Checklist: Patient identified, Emergency Drugs available, Suction available and Patient being monitored Patient Re-evaluated:Patient Re-evaluated prior to induction Oxygen Delivery Method: Circle system utilized Preoxygenation: Pre-oxygenation with 100% oxygen Induction Type: IV induction Ventilation: Oral airway inserted - appropriate to patient size Laryngoscope Size: Glidescope and 4 Grade View: Grade II Tube type: Oral Tube size: 7.5 mm Number of attempts: 2 Airway Equipment and Method: Stylet and Oral airway Placement Confirmation: ETT inserted through vocal cords under direct vision, positive ETCO2 and breath sounds checked- equal and bilateral Secured at: 23 cm Tube secured with: Tape Dental Injury: Teeth and Oropharynx as per pre-operative assessment

## 2023-11-27 NOTE — Op Note (Signed)
;     Patient name: [name] MRN: [id number] DOB: [birth year] Sex: male  11/27/2023 Pre-operative Diagnosis: End-stage renal disease, aneurysmal left upper arm fistula Post-operative diagnosis:  Same Surgeon:  Malvina New Procedure:   #1: Revision of left upper arm fistula via aneurysm resection and plication   #2: Ultrasound-guided access, right internal jugular vein    #3: Placement of 23 cm right IJ tunneled dialysis cath under fluoroscopic guidance Anesthesia:  General Blood Loss:  150 Specimens:  none  Findings: Catheter tip at the cavoatrial junction.  Indications: This is a 58 year old gentleman with end-stage renal disease on dialysis who is having trouble with his fistula.  He recently underwent fistulogram that showed stenosis and kinking.  I resected the large aneurysm.  Because of the redundancy I was able to shorten the fistula, reapproximate the posterior wall and closed the aneurysm anteriorly resecting about 50%.  Procedure:  The patient was identified in the holding area and taken to [facility] OR ROOM 12  The patient was then placed supine on the table. general anesthesia was administered.  The patient was prepped and draped in the usual sterile fashion.  A time out was called and antibiotics were administered.  Ultrasound was used to evaluate the right internal jugular vein which was widely patent and easily compressible.  It was cannulated under ultrasound guidance with a micropuncture needle.  A 018 wire was inserted followed by placement of a micropuncture sheath.  Next, an 035 wire was placed.  The subcutaneous tract was dilated with sequential dilators and a peel-away sheath was placed.  The skin exit site was made below the right clavicle.  A #11 blade was used to make a skin nick.  I then created a tunnel up to the neck incision and brought the catheter through this.  It was felt that the peel-away sheath.  The catheter tip was noted to be at the cavoatrial junction.  There  were no kinks within the catheter.  Both ports flushed and aspirated without difficulty.  The catheter sutured the skin with 3-0 nylon.  The neck incision was closed with 4-0 Vicryl and Dermabond.  Attention was turned towards the left upper arm aneurysmal fistula.  An oblique incision was made incorporating the aneurysmal fistula.  The fistula was dissected out circumferentially.  It was very redundant and large.  Once I had it fully exposed, it was clamped proximally and distally.  I transected the fistula near the antecubital crease and then determine the appropriate amount of vein needed to perform a end-to-end anastomosis.  I resected the midportion of the fistula.  I trimmed the aneurysm.  The posterior wall was reapproximated with running 5-0 Prolene.  I then closed the anterior wall with a running suture with 5-0 Prolene in 2 layers.  Once this was completed the clamps were released.  There was no excellent thrill within the fistula.  The limb was then irrigated.  Hemostasis was achieved.  I reapproximated the subcutaneous tissue with 3-0 Vicryl.  The skin was closed with running 3-0 nylon.  Sterile dressings were placed.  There are no immediate complications.   Disposition: To PACU stable   V. Malvina New, M.D., [facility] [place] Office: [phone] Pager:  [phone]

## 2023-11-27 NOTE — Interval H&P Note (Signed)
 History and Physical Interval Note:  11/27/2023 8:18 AM  [name]  has presented today for surgery, with the diagnosis of ESRD.  The various methods of treatment have been discussed with the patient and family. After consideration of risks, benefits and other options for treatment, the patient has consented to  Procedure(s): FISTULA SUPERFICIALIZATION (Left) INSERTION OF DIALYSIS CATHETER (N/A) as a surgical intervention.  The patient's history has been reviewed, patient examined, no change in status, stable for surgery.  I have reviewed the patient's chart and labs.  Questions were answered to the patient's satisfaction.     Malvina New

## 2023-11-27 NOTE — Transfer of Care (Cosign Needed)
 Immediate Anesthesia Transfer of Care Note  Patient: Crispin Vogel Twist  Procedure(s) Performed: Aneurysm plication of left arm (Left: Arm Upper) INSERTION OF DIALYSIS CATHETER  Patient Location: PACU  Anesthesia Type:General  Level of Consciousness: awake, drowsy, and patient cooperative  Airway & Oxygen Therapy: Patient Spontanous Breathing and Patient connected to face mask oxygen  Post-op Assessment: Report given to RN and Post -op Vital signs reviewed and stable  Post vital signs: Reviewed and stable  Last Vitals:  Vitals Value Taken Time  BP 169/97 11/27/23 11:04  Temp 98.3   Pulse 75 11/27/23 11:06  Resp 8 11/27/23 11:06  SpO2 93 % 11/27/23 11:06  Vitals shown include unfiled device data.  Last Pain:  Vitals:   11/27/23 0701  TempSrc: Oral  PainSc:       Patients Stated Pain Goal: 0 (11/27/23 0659)  Complications: No notable events documented.

## 2023-11-27 NOTE — Anesthesia Postprocedure Evaluation (Signed)
 Anesthesia Post Note  Patient: [name]  Procedure(s) Performed: Aneurysm plication of left arm (Left: Arm Upper) INSERTION OF DIALYSIS CATHETER     Patient location during evaluation: PACU Anesthesia Type: General Level of consciousness: awake and alert, oriented and patient cooperative Pain management: pain level controlled Vital Signs Assessment: post-procedure vital signs reviewed and stable Respiratory status: spontaneous breathing, nonlabored ventilation and respiratory function stable Cardiovascular status: blood pressure returned to baseline and stable Postop Assessment: no apparent nausea or vomiting Anesthetic complications: no   No notable events documented.  Last Vitals:  Vitals:   11/27/23 1245 11/27/23 1300  BP: (!) 161/92 (!) 161/92  Pulse: 73 75  Resp: 10 10  Temp:    SpO2: 98% 96%    Last Pain:  Vitals:   11/27/23 1300  TempSrc:   PainSc: Asleep                 [name]

## 2023-11-27 NOTE — Progress Notes (Signed)
 Patient stated he did not take his Coreg  this am before surgery because it drops my pressure too low if I don't eat something. Patient not wanting to take med at this time since he is npo.

## 2023-11-27 NOTE — Discharge Instructions (Signed)
   [facility]  Discharge Instructions  AV Fistula or Graft Surgery for Dialysis Access  Please refer to the following instructions for your post-procedure care. Your surgeon or physician assistant will discuss any changes with you.  Activity  You may drive the day following your surgery, if you are comfortable and no longer taking prescription pain medication. Resume full activity as the soreness in your incision resolves.  Bathing/Showering  You may shower after you go home. Keep your incision dry for 48 hours. Do not soak in a bathtub, hot tub, or swim until the incision heals completely. You may not shower if you have a hemodialysis catheter.  Incision Care  Clean your incision with mild soap and water after 48 hours. Pat the area dry with a clean towel. You do not need a bandage unless otherwise instructed. Do not apply any ointments or creams to your incision. You may have skin glue on your incision. Do not peel it off. It will come off on its own in about one week. Your arm may swell a bit after surgery. To reduce swelling use pillows to elevate your arm so it is above your heart. Your doctor will tell you if you need to lightly wrap your arm with an ACE bandage.  Diet  Resume your normal diet. There are not special food restrictions following this procedure. In order to heal from your surgery, it is CRITICAL to get adequate nutrition. Your body requires vitamins, minerals, and protein. Vegetables are the best source of vitamins and minerals. Vegetables also provide the perfect balance of protein. Processed food has little nutritional value, so try to avoid this.  Medications  Resume taking all of your medications. If your incision is causing pain, you may take over-the counter pain relievers such as acetaminophen  (Tylenol ). If you were prescribed a stronger pain medication, please be aware these medications can cause nausea and constipation. Prevent  nausea by taking the medication with a snack or meal. Avoid constipation by drinking plenty of fluids and eating foods with high amount of fiber, such as fruits, vegetables, and grains.  Do not take Tylenol  if you are taking prescription pain medications.  Follow up Your surgeon may want to see you in the office following your access surgery. If so, this will be arranged at the time of your surgery.  Please call us  immediately for any of the following conditions:  Increased pain, redness, drainage (pus) from your incision site Fever of 101 degrees or higher Severe or worsening pain at your incision site Hand pain or numbness.  Reduce your risk of vascular disease:  Stop smoking. If you would like help, call [facility] at 1-800-QUIT-NOW ((812)113-4142) or [place] at [phone]  Manage your cholesterol Maintain a desired weight Control your diabetes Keep your blood pressure down  Dialysis  It will take several weeks for your incision to heal. Your surgeon will determine when it is okay to start using your fistula again. Your nephrologist will continue to direct your dialysis. You can continue to use your Permcath until your fistula is ready for use.   11/27/2023 [name] [id number] 1965-07-12  Surgeon(s): Serene Gaile ORN, MD  Procedure(s): Aneurysm plication of left arm INSERTION OF DIALYSIS CATHETER   May stick graft immediately   May stick graft on designated area only:   x Do not stick fistula for 5 weeks    If you have any questions, please call the office at [phone].

## 2023-11-28 ENCOUNTER — Encounter (HOSPITAL_COMMUNITY): Payer: Self-pay | Admitting: Surgery

## 2023-12-06 ENCOUNTER — Ambulatory Visit (HOSPITAL_BASED_OUTPATIENT_CLINIC_OR_DEPARTMENT_OTHER): Admitting: Cardiology

## 2023-12-09 ENCOUNTER — Encounter (HOSPITAL_COMMUNITY): Payer: Self-pay

## 2023-12-09 ENCOUNTER — Emergency Department (HOSPITAL_COMMUNITY)

## 2023-12-09 ENCOUNTER — Emergency Department (HOSPITAL_COMMUNITY)
Admission: EM | Admit: 2023-12-09 | Discharge: 2023-12-09 | Disposition: A | Discharge status: Home / Self Care | Attending: Emergency Medicine | Admitting: Emergency Medicine

## 2023-12-09 DIAGNOSIS — M79622 Pain in left upper arm: Secondary | ICD-10-CM | POA: Diagnosis not present

## 2023-12-09 DIAGNOSIS — N186 End stage renal disease: Secondary | ICD-10-CM | POA: Diagnosis not present

## 2023-12-09 DIAGNOSIS — Z79899 Other long term (current) drug therapy: Secondary | ICD-10-CM | POA: Diagnosis not present

## 2023-12-09 DIAGNOSIS — J810 Acute pulmonary edema: Secondary | ICD-10-CM | POA: Insufficient documentation

## 2023-12-09 DIAGNOSIS — R531 Weakness: Secondary | ICD-10-CM | POA: Insufficient documentation

## 2023-12-09 DIAGNOSIS — Z992 Dependence on renal dialysis: Secondary | ICD-10-CM | POA: Diagnosis not present

## 2023-12-09 DIAGNOSIS — R0602 Shortness of breath: Secondary | ICD-10-CM | POA: Diagnosis present

## 2023-12-09 DIAGNOSIS — R519 Headache, unspecified: Secondary | ICD-10-CM | POA: Diagnosis not present

## 2023-12-09 LAB — BASIC METABOLIC PANEL WITH GFR
Anion gap: 17 — ABNORMAL HIGH (ref 5–15)
BUN: 89 mg/dL — ABNORMAL HIGH (ref 6–20)
CO2: 23 mmol/L (ref 22–32)
Calcium: 8.6 mg/dL — ABNORMAL LOW (ref 8.9–10.3)
Chloride: 95 mmol/L — ABNORMAL LOW (ref 98–111)
Creatinine, Ser: 16.51 mg/dL — ABNORMAL HIGH (ref 0.61–1.24)
GFR, Estimated: 3 mL/min — ABNORMAL LOW (ref >60)
Glucose, Bld: 140 mg/dL — ABNORMAL HIGH (ref 70–99)
Potassium: 5.2 mmol/L — ABNORMAL HIGH (ref 3.5–5.1)
Sodium: 135 mmol/L (ref 135–145)

## 2023-12-09 LAB — CBC WITH DIFFERENTIAL/PLATELET
Abs Immature Granulocytes: 0.06 K/uL (ref 0.00–0.07)
Basophils Absolute: 0 K/uL (ref 0.0–0.1)
Basophils Relative: 0 %
Eosinophils Absolute: 0.3 K/uL (ref 0.0–0.5)
Eosinophils Relative: 3 %
HCT: 30.6 % — ABNORMAL LOW (ref 39.0–52.0)
Hemoglobin: 9.8 g/dL — ABNORMAL LOW (ref 13.0–17.0)
Immature Granulocytes: 1 %
Lymphocytes Relative: 9 %
Lymphs Abs: 0.9 K/uL (ref 0.7–4.0)
MCH: 31.2 pg (ref 26.0–34.0)
MCHC: 32 g/dL (ref 30.0–36.0)
MCV: 97.5 fL (ref 80.0–100.0)
Monocytes Absolute: 0.4 K/uL (ref 0.1–1.0)
Monocytes Relative: 4 %
Neutro Abs: 8.7 K/uL — ABNORMAL HIGH (ref 1.7–7.7)
Neutrophils Relative %: 83 %
Platelets: 370 K/uL (ref 150–400)
RBC: 3.14 MIL/uL — ABNORMAL LOW (ref 4.22–5.81)
RDW: 12.9 % (ref 11.5–15.5)
WBC: 10.4 K/uL (ref 4.0–10.5)
nRBC: 0 % (ref 0.0–0.2)

## 2023-12-09 LAB — TROPONIN I (HIGH SENSITIVITY)
Troponin I (High Sensitivity): 45 ng/L — ABNORMAL HIGH (ref <18)
Troponin I (High Sensitivity): 50 ng/L — ABNORMAL HIGH (ref <18)

## 2023-12-09 LAB — HEPATITIS B SURFACE ANTIGEN: Hepatitis B Surface Ag: NONREACTIVE

## 2023-12-09 MED ORDER — HEPARIN SODIUM (PORCINE) 1000 UNIT/ML DIALYSIS
3000.0000 [IU] | Freq: Once | INTRAMUSCULAR | Status: AC
  Administered 2023-12-09: 3000 [IU] via INTRAVENOUS_CENTRAL

## 2023-12-09 MED ORDER — NEPRO/CARBSTEADY PO LIQD
237.0000 mL | ORAL | Status: DC | PRN
Start: 2023-12-09 — End: 2023-12-09

## 2023-12-09 MED ORDER — HEPARIN SODIUM (PORCINE) 1000 UNIT/ML IJ SOLN
INTRAMUSCULAR | Status: AC
  Filled 2023-12-09: qty 4

## 2023-12-09 MED ORDER — ALBUTEROL SULFATE (2.5 MG/3ML) 0.083% IN NEBU
2.5000 mg | INHALATION_SOLUTION | Freq: Once | RESPIRATORY_TRACT | Status: AC
  Administered 2023-12-09: 2.5 mg via RESPIRATORY_TRACT
  Filled 2023-12-09: qty 3

## 2023-12-09 MED ORDER — BUDESONIDE 0.5 MG/2ML IN SUSP
0.5000 mg | Freq: Once | RESPIRATORY_TRACT | Status: AC
  Administered 2023-12-09: 0.5 mg via RESPIRATORY_TRACT
  Filled 2023-12-09: qty 2

## 2023-12-09 MED ORDER — ANTICOAGULANT SODIUM CITRATE 4% (200MG/5ML) IV SOLN: 5.0000 mL | Status: DC | PRN

## 2023-12-09 MED ORDER — HEPARIN SODIUM (PORCINE) 1000 UNIT/ML IJ SOLN
INTRAMUSCULAR | Status: AC
  Filled 2023-12-09: qty 3

## 2023-12-09 MED ORDER — HEPARIN SODIUM (PORCINE) 1000 UNIT/ML DIALYSIS: 1500.0000 [IU] | INTRAMUSCULAR | Status: DC | PRN

## 2023-12-09 MED ORDER — HYDRALAZINE HCL 20 MG/ML IJ SOLN
20.0000 mg | Freq: Once | INTRAMUSCULAR | Status: AC
  Administered 2023-12-09: 20 mg via INTRAVENOUS
  Filled 2023-12-09: qty 1

## 2023-12-09 MED ORDER — ALTEPLASE 2 MG IJ SOLR: 2.0000 mg | Freq: Once | INTRAMUSCULAR | Status: DC | PRN

## 2023-12-09 MED ORDER — CHLORHEXIDINE GLUCONATE CLOTH 2 % EX PADS: 6.0000 | MEDICATED_PAD | Freq: Every day | CUTANEOUS | Status: DC

## 2023-12-09 MED ORDER — LIDOCAINE HCL (PF) 1 % IJ SOLN: 5.0000 mL | INTRAMUSCULAR | Status: DC | PRN

## 2023-12-09 MED ORDER — LIDOCAINE-PRILOCAINE 2.5-2.5 % EX CREA: 1.0000 | TOPICAL_CREAM | CUTANEOUS | Status: DC | PRN

## 2023-12-09 MED ORDER — PENTAFLUOROPROP-TETRAFLUOROETH EX AERO: 1.0000 | INHALATION_SPRAY | CUTANEOUS | Status: DC | PRN

## 2023-12-09 MED ORDER — HEPARIN SODIUM (PORCINE) 1000 UNIT/ML DIALYSIS
1000.0000 [IU] | INTRAMUSCULAR | Status: DC | PRN
  Administered 2023-12-09: 3800 [IU]
  Filled 2023-12-09: qty 1

## 2023-12-09 NOTE — ED Provider Notes (Signed)
 Care assumed from Dr. Haze.  At time of transfer of care, patient is awaiting labs and then we will plan to call nephrology as patient likely is dialysis today.  By report, patient takes dialysis Monday Wednesday 01/03/2024 but missed 03-Jan-2024 due to a death in the family and was feeling too ill this morning.  He has had elevated blood pressures requiring medication and is feeling shortness of breath.  Anticipate patient will likely need dialysis.  7:59 AM Labs have returned, will call nephrology to discuss dialysis.  Will wait and see if they would like admitted after dialysis or wait to reassess after dialysis.  Patient getting dialysis.  Anticipate he will likely return to emergency department for reassessment and discharge if he is doing better.   [name], Lonni PARAS, MD 12/09/23 1501

## 2023-12-09 NOTE — ED Notes (Signed)
 Patient ambulated to the bathroom independently. Patient still remains on room air.

## 2023-12-09 NOTE — ED Notes (Signed)
 Patient transported to dialysis

## 2023-12-09 NOTE — ED Notes (Signed)
 Patient's O2 sat hovering around 88-90% on room air, so patient placed back onto 2 lpm.

## 2023-12-09 NOTE — ED Provider Notes (Addendum)
 Patient returned from dialysis.  I reevaluated him at bedside, he states that he feels improved and has no new complaints.  Is asking to be discharged home.  Patient is still on 2 L nasal cannula which is a new requirement for him.  We attempted to wean him off the first time and he dropped down to about 88%.  Will repeat chest x-ray for further evaluation.  Repeat chest x-ray is improved but still shows continued vascular congestion.  Patient is requesting to try to wean off the oxygen 1 more time.  He also missed his breathing treatments today which have been ordered to be done here in the department.  Following this we will wean the patient off oxygen.  If this is not successful we will recommend inpatient admission.  Patient tolerated nebulizer medicine.  Patient has been off oxygen, sitting up and comfortable, maintaining oxygen saturation 97 to 99% on room air.  He is requesting to go home which I feel is appropriate at this time.  Patient at this time appears safe and stable for discharge and close outpatient follow up. Discharge plan and strict return to ED precautions discussed, patient verbalizes understanding and agreement.      Bari Roxie HERO, DO 12/09/23 2239

## 2023-12-09 NOTE — ED Notes (Signed)
 Patient was on 2 lpm via [place] when he returned from HD, but stated he normally does not wear O2. Trialing patient on room air.

## 2023-12-09 NOTE — ED Notes (Signed)
 Patient ambulated from his bed the end of the nurses station and then around the nurses station in the Blue/Orange zone. Patient denies feeling short of breath but does have a dry cough. He states he was unable to take his nebulizer and albuterol  this morning due ot being at dialysis. Patient missed his Friday session with dialysis due to the death of his father. He states he feels fine. When ambulating the patiens oxygen saturation remains at 96-99% on room air. However when standing still or sitting the patients oxygen decreases to 86-91%. He denies feeling short of breath with ambulating or when sitting.

## 2023-12-09 NOTE — Discharge Instructions (Signed)
 Dialysis was performed today.  Please resume regularly scheduled dialysis.

## 2023-12-09 NOTE — Progress Notes (Signed)
 Received patient in bed to unit.  Alert and oriented.  Informed consent signed and in chart.   TX duration:3 hours 15 minutes  Patient tolerated well.  Transported back to the room  Alert, without acute distress.  Hand-off given to patient's nurse.   Access used: Right internal jugular HD cath  Access issues: A-V and V-A  Total UF removed: 4L Medication(s) given: none   12/09/23 1620  Vitals  Temp 98.4 F (36.9 C)  Temp Source Oral  BP (!) 176/106  MAP (mmHg) 126  Pulse Rate 87  ECG Heart Rate 88  Resp 19  Weight  (Patient in ER stretcher UTA)  Type of Weight Post-Dialysis  Oxygen Therapy  SpO2 99 %  O2 Device Nasal Cannula  O2 Flow Rate (L/min) 2 L/min  During Treatment Monitoring  Duration of HD Treatment -hour(s) 3.25 hour(s)  HD Safety Checks Performed Yes  Intra-Hemodialysis Comments Tx completed;Tolerated well  Dialysis Fluid Bolus Normal Saline  Bolus Amount (mL) 300 mL  Post Treatment  Dialyzer Clearance Clear  Liters Processed 81  Fluid Removed (mL) 4000 mL  Tolerated HD Treatment Yes  Hemodialysis Catheter Right Subclavian Double lumen Permanent (Tunneled)  Placement Date/Time: 11/27/23 0927   Placed prior to admission: No  Serial / Lot #: [id number]  Expiration Date: 09/06/21  Time Out: Correct patient;Correct site;Correct procedure  Maximum sterile barrier precautions: Sterile gown;Hand hygiene;Sterile ...  Site Condition No complications  Blue Lumen Status Flushed;Antimicrobial dead end cap;Heparin  locked  Red Lumen Status Flushed;Antimicrobial dead end cap;Heparin  locked  Purple Lumen Status N/A  Catheter fill solution Heparin  1000 units/ml  Catheter fill volume (Arterial) 1.9 cc  Catheter fill volume (Venous) 1.9  Dressing Type Transparent  Dressing Status Antimicrobial disc/dressing in place;Clean, Dry, Intact  Drainage Description None  Dressing Change Due 12/16/23  Post treatment catheter status Capped and Clamped     Camellia Brasil  LPN [facility]

## 2023-12-09 NOTE — ED Provider Notes (Signed)
  EMERGENCY DEPARTMENT AT [facility] Provider Note   CSN: [id number] Arrival date & time: 12/09/23  0547     Patient presents with: Shortness of Breath   [name] is a 58 y.o. male.   Presents to the emergency department for evaluation of shortness of breath and generalized weakness.  Patient is end-stage renal disease, on dialysis Monday Wednesday Friday.  Patient did not dialyze last Friday, therefore has not had dialysis for 5 days.  He feels like he is swollen all over.  EMS report low oxygen saturations, placed on supplemental oxygen.  Patient reports headache and pain in his left upper arm.  Patient recently had revision of his left upper arm fistula.       Prior to Admission medications   Medication Sig Start Date End Date Taking? Authorizing Provider  Accu-Chek Softclix Lancets lancets Use as directed.  For insulin -dependent type 2 diabetes Monitor  glucose 3 times a day Patient taking differently: 1 each by Other route See admin instructions. Use as directed.  For insulin -dependent type 2 diabetes Monitor  glucose 3 times a day 01/06/20   Jerri Keys, MD  acetaminophen  (TYLENOL ) 325 MG tablet Take 2 tablets (650 mg total) by mouth every 6 (six) hours as needed for mild pain (pain score 1-3) (or Fever >/= 101). 06/13/23   Rosendo Rush, MD  albuterol  (PROVENTIL ) (2.5 MG/3ML) 0.083% nebulizer solution Take 3 mLs (2.5 mg total) by nebulization every 6 (six) hours as needed for wheezing or shortness of breath. 06/13/23   Rosendo Rush, MD  albuterol  (VENTOLIN  HFA) 108 [phone] Base) MCG/ACT inhaler Inhale 2 puffs into the lungs every 6 (six) hours as needed for wheezing or shortness of breath. 09/25/22   Iva Marty Saltness, MD  amLODipine  (NORVASC ) 10 MG tablet Take 1 tablet (10 mg total) by mouth daily. 06/13/23   Rosendo Rush, MD  arformoterol  (BROVANA ) 15 MCG/2ML NEBU Take 2 mLs (15 mcg total) by nebulization 2 (two) times daily. 08/07/23   Kara Dorn NOVAK, MD   benzonatate  (TESSALON ) 100 MG capsule Take 200 mg by mouth 3 (three) times daily as needed for cough. 04/18/23   [provider]  budesonide  (PULMICORT ) 0.5 MG/2ML nebulizer solution Take 2 mLs (0.5 mg total) by nebulization 2 (two) times daily. 08/07/23   Kara Dorn NOVAK, MD  busPIRone  (BUSPAR ) 5 MG tablet Take 5 mg by mouth 2 (two) times daily. 02/05/23   [provider]  calcitRIOL  (ROCALTROL ) 0.25 MCG capsule Take 1 capsule (0.25 mcg total) by mouth every Monday, Wednesday, and Friday. 01/08/20   Jerri Keys, MD  camphor-menthol  [facility]) lotion Apply topically 2 (two) times daily. Patient taking differently: Apply 1 Application topically 2 (two) times daily. 12/03/22   Nicholas Bar, MD  carvedilol  (COREG ) 25 MG tablet Take 1 tablet (25 mg total) by mouth 2 (two) times daily with a meal. 05/24/22   Jolaine Pac, DO  famotidine  (PEPCID ) 10 MG tablet Take 1 tablet (10 mg total) by mouth daily. 06/13/23   Rosendo Rush, MD  fluticasone  (FLONASE ) 50 MCG/ACT nasal spray Place 1 spray into both nostrils daily. 08/07/23   Kara Dorn NOVAK, MD  gabapentin  (NEURONTIN ) 300 MG capsule Take 1 capsule (300 mg total) by mouth daily. 01/25/23   Lenard Calin, MD  glucose blood test strip Use as instructed For insulin -dependent type 2 diabetes Monitor  glucose 3 times a day Patient taking differently: 1 each by Other route See admin instructions. Use as instructed For insulin -dependent  type 2 diabetes Monitor  glucose 3 times a day 01/06/20   Jerri Keys, MD  hydrALAZINE  (APRESOLINE ) 50 MG tablet Take 1 tablet (50 mg total) by mouth 3 (three) times daily. Patient taking differently: Take 50 mg by mouth 3 (three) times daily. Patient out of medication 04/08/2023, for about a month. 02/19/23 10/18/23  Elnora Ip, MD  hydrOXYzine  (ATARAX ) 25 MG tablet Take 25 mg by mouth daily. 02/08/23   [provider]  ipratropium (ATROVENT ) 0.03 % nasal spray Place 2 sprays into both nostrils  every 12 (twelve) hours. 08/07/23   Kara Dorn NOVAK, MD  Iron Sucrose  (VENOFER  IV) Inject 50 mg into the vein See admin instructions. Given on days of Dialysis: Monday, Wednesday, and Friday. 03/08/23 03/06/24  [provider]  levocetirizine (XYZAL ) 5 MG tablet Take 1 tablet (5 mg total) by mouth every evening. 09/25/22   Iva Marty Saltness, MD  lidocaine  (XYLOCAINE ) 2 % jelly Apply 1 Application topically daily. 11/26/22   Dreama Longs, MD  multivitamin (RENA-VIT) TABS tablet Take 1 tablet by mouth at bedtime. 05/24/22   Jolaine Pac, DO  ondansetron  (ZOFRAN -ODT) 4 MG disintegrating tablet Take 4 mg by mouth daily as needed for vomiting or nausea. 04/18/23   [provider]  pantoprazole  (PROTONIX ) 40 MG tablet Take 40 mg by mouth daily.    [provider]  polyethylene glycol powder (GLYCOLAX /MIRALAX ) 17 GM/SCOOP powder Mix as directed and take 1 capful (17 g) by mouth daily. Patient taking differently: Take 17 g by mouth daily as needed for mild constipation. 02/20/23   Elnora Ip, MD  promethazine  (PHENERGAN ) 6.25 MG/5ML solution Take 10 mLs (12.5 mg total) by mouth every 6 (six) hours for 14 days. 06/13/23 10/18/23  Rosendo Rush, MD  revefenacin  (YUPELRI ) 175 MCG/3ML nebulizer solution Take 3 mLs (175 mcg total) by nebulization daily. 08/07/23   Kara Dorn NOVAK, MD  SENSIPAR  60 MG tablet Take 60 mg by mouth daily. 06/20/23   [provider]  sevelamer  carbonate (RENVELA ) 800 MG tablet Take 3 tablets (2,400 mg total) by mouth 3 (three) times daily with meals. 12/03/22   Nicholas Bar, MD  tiZANidine  (ZANAFLEX ) 4 MG tablet Take 4 mg by mouth as needed. 12/11/22   [provider]  traMADol  (ULTRAM ) 50 MG tablet Take 1 tablet (50 mg total) by mouth every 6 (six) hours as needed. 11/27/23 11/26/24  Schuh, McKenzi P, PA-C  Vitamin D , Ergocalciferol , (DRISDOL ) 1.25 MG (50000 UNIT) CAPS capsule Take 50,000 Units by mouth See admin instructions.  Take one tablet by mouth on Monday, Wednesdays and Fridays per patient 08/17/19   [provider]  XPHOZAH  30 MG TABS Take 1 tablet by mouth 2 (two) times daily. 01/15/23   [provider]  insulin  aspart protamine  - aspart (NOVOLOG  MIX 70/30 FLEXPEN) (70-30) 100 UNIT/ML FlexPen Inject 15-30 Units into the skin 2 (two) times daily.  05/24/22  [provider]    Allergies: Hydrocodone  and Oxycodone     Review of Systems  Updated Vital Signs BP (!) 204/97 (BP Location: Right Arm)   Pulse 85   Temp 97.7 F (36.5 C) (Oral)   Resp 20   SpO2 98%   Physical Exam Vitals and nursing note reviewed.  Constitutional:      General: He is not in acute distress.    Appearance: He is well-developed.  HENT:     Head: Normocephalic and atraumatic.     Mouth/Throat:     Mouth: Mucous membranes are moist.  Eyes:     General: Vision grossly intact. Gaze aligned appropriately.     Extraocular Movements: Extraocular movements intact.     Conjunctiva/sclera: Conjunctivae normal.  Cardiovascular:     Rate and Rhythm: Normal rate and regular rhythm.     Pulses: Normal pulses.     Heart sounds: Normal heart sounds, S1 normal and S2 normal. No murmur heard.    No friction rub. No gallop.  Pulmonary:     Effort: Pulmonary effort is normal. No respiratory distress.     Breath sounds: Normal breath sounds.  Abdominal:     Palpations: Abdomen is soft.     Tenderness: There is no abdominal tenderness. There is no guarding or rebound.     Hernia: No hernia is present.  Musculoskeletal:        General: No swelling.     Cervical back: Full passive range of motion without pain, normal range of motion and neck supple. No pain with movement, spinous process tenderness or muscular tenderness. Normal range of motion.     Right lower leg: No edema.     Left lower leg: No edema.     Comments: Long surgical incision over left bicep, stitches intact, no erythema, no bleeding, no drainage   Skin:    General: Skin is warm and dry.     Capillary Refill: Capillary refill takes less than 2 seconds.     Findings: No ecchymosis, erythema, lesion or wound.  Neurological:     Mental Status: He is alert and oriented to person, place, and time.     GCS: GCS eye subscore is 4. GCS verbal subscore is 5. GCS motor subscore is 6.     Cranial Nerves: Cranial nerves 2-12 are intact.     Sensory: Sensation is intact.     Motor: Motor function is intact. No weakness or abnormal muscle tone.     Coordination: Coordination is intact.  Psychiatric:        Mood and Affect: Mood normal.        Speech: Speech normal.        Behavior: Behavior normal.     (all labs ordered are listed, but only abnormal results are displayed) Labs Reviewed  CBC WITH DIFFERENTIAL/PLATELET  BASIC METABOLIC PANEL WITH GFR  TROPONIN I (HIGH SENSITIVITY)    EKG: None  Radiology: [facility] Chest Port 1 View Result Date: 12/09/2023 CLINICAL DATA:  58 year old male missed dialysis 3 days ago. Shortness of breath. EXAM: PORTABLE CHEST 1 VIEW COMPARISON:  Portable chest 07/24/2023 and earlier. FINDINGS: Portable AP semi upright view at 0621 hours. Right chest dual lumen dialysis type catheter in place. New left chest loop recorder or superficial ICD. Lower lung volumes. Mild diffuse increased pulmonary interstitial opacity and trace fluid tracking in the bilateral pleural fissures. Stable cardiomegaly and mediastinal contours. Other mediastinal contours are within normal limits. Visualized tracheal air column is within normal limits. No pneumothorax. No air bronchograms. Paucity of bowel gas. No acute osseous abnormality identified. IMPRESSION: 1. Symmetric pulmonary edema with trace pleural fluid. 2. Stable cardiomegaly. 3. New right chest dialysis type catheter. New left chest cardiac loop recorder or superficial ICD. Electronically Signed   By: VEAR Hurst M.D.   On: 12/09/2023 06:33   CT HEAD WO CONTRAST ( ) Result Date:  12/09/2023 CLINICAL DATA:  58 year old male with increasing severe headache. Dialysis patient, missed dialysis 3 days ago. EXAM: CT HEAD WITHOUT CONTRAST TECHNIQUE: Contiguous axial images were obtained from the base of the  skull through the vertex without intravenous contrast. RADIATION DOSE REDUCTION: This exam was performed according to the departmental dose-optimization program which includes automated exposure control, adjustment of the mA and/or kV according to patient size and/or use of iterative reconstruction technique. COMPARISON:  Brain MRI and Head CT09/13/2023. FINDINGS: Brain: Cerebral volume is stable and normal. Incidental right inferior lentiform perivascular space (normal variant). No midline shift, ventriculomegaly, mass effect, evidence of mass lesion, intracranial hemorrhage or evidence of cortically based acute infarction. Gray-white differentiation is stable since 2023, subtle scattered subcortical white matter hypodensity, less apparent than on the previous MRI. Vascular: Calcified atherosclerosis at the skull base. No suspicious intracranial vascular hyperdensity. Skull: Mild motion artifact at the skull base. Intact. No acute osseous abnormality identified. Sinuses/Orbits: Visualized paranasal sinuses and mastoids are stable and well aerated. Other: No acute orbit or scalp soft tissue finding. IMPRESSION: No acute intracranial abnormality. Negative for age for age noncontrast CT appearance of the brain. Electronically Signed   By: VEAR Hurst M.D.   On: 12/09/2023 06:32     Procedures   Medications Ordered in the ED  hydrALAZINE  (APRESOLINE ) injection 20 mg (has no administration in time range)                                    Medical Decision Making Amount and/or Complexity of Data Reviewed External Data Reviewed: labs, ECG and notes. Labs: ordered. Decision-making details documented in ED Course. Radiology: ordered and independent interpretation performed. Decision-making  details documented in ED Course. ECG/medicine tests: ordered and independent interpretation performed. Decision-making details documented in ED Course.  Risk Prescription drug management.   Differential Diagnosis considered includes, but not limited to: CHF exacerbation; ACS/MI; Bronchitis/URI; Pneumonia; PE  Patient who is end-stage renal disease, on dialysis presents with shortness of breath.  Patient has not had dialysis in 5 days.  He missed his last session due to a death in the family.  He is due for dialysis later today but felt too weak to go to his center.  Patient hypertensive at arrival.  He is complaining of a headache.  CT head without acute pathology.  Chest x-ray performed at arrival consistent with volume overload.  Awaiting labs.  Will signout to oncoming ER physician, will need dialysis here at the hospital this morning.  Blood pressure treated with IV hydralazine .  He has weaned down to 3 L nasal cannula and is tolerating.     Final diagnoses:  Acute pulmonary edema [facility])    ED Discharge Orders     None          Haze Lonni PARAS, MD 12/09/23 [phone]

## 2023-12-09 NOTE — ED Triage Notes (Signed)
 Pt coming from home pt has dialysis MWF missed Friday dialysis due to personal reasons. Pt endorsing SOB, pt unable to make it to todays appt due to weakness, pt endorsing edema in all extremities, hx of chf. Recent surgery in L bicep approx 2 wks ago. Pt at 90% O2 on RA.

## 2023-12-09 NOTE — ED Notes (Signed)
 Patient has maintained an oxygen saturation of 97-99% on room air. States he would like to go home.

## 2023-12-09 NOTE — Consult Note (Signed)
 Renal Service Consult Note [facility] [name] JONETTA Fret, MD  Patient: [name] Date: 12/09/2023 Requesting Physician: Dr. Ginger  Reason for Consult: ESRD patient with shortness of breath HPI: The patient is a 58 y.o. year-old w/ PMH as below who presented to the emergency room today reporting shortness of breath.  Patient's father passed away last week and he missed Friday dialysis due to this.  In the ED blood pressure was 190/85, HR 85, RR 17-20.  Labs show K+ 5.2, BUN 89, creatinine 16, hemoglobin 9.8.  Patient may or may not require admission.  We are asked to see the patient for dialysis.   Pt seen and ED.  Main complaint is shortness of breath.  No chest pain, no abdominal pain, no nausea vomiting or diarrhea.   ROS - denies CP, no joint pain, no HA, no blurry vision, no rash, no diarrhea, no nausea/ vomiting   Past Medical History  Past Medical History:  Diagnosis Date   Adjustment disorder with mixed anxiety and depressed mood 12/28/2014   Asthma    Bacteremia due to Pseudomonas 12/01/2022   CHF (congestive heart failure) (HCC)    CPAP (continuous positive airway pressure) dependence    Diabetes mellitus without complication (HCC)    DM (diabetes mellitus) type II controlled with renal manifestation (HCC) 12/28/2014   DM (diabetes mellitus) type II controlled, neurological manifestation (HCC) 12/28/2014   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HCC)    Encephalopathy, hypertensive 12/28/2014   ESRD on hemodialysis (HCC)    MWF at SW / [facility]   GERD (gastroesophageal reflux disease)    Hypertension    Hypertensive emergency without congestive heart failure 12/27/2014   Pleural effusion    Pneumonia    Possible Panic disorder 12/28/2014   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   Sleep apnea    Past Surgical History  Past Surgical History:  Procedure Laterality Date   A/V FISTULAGRAM N/A 05/23/2023   Procedure: A/V Fistulagram;  Surgeon: Norine Manuelita LABOR, MD;   Location: [facility] IN[facility];  Service: Cardiovascular;  Laterality: N/A;   A/V SHUNT INTERVENTION Left 11/14/2023   Procedure: A/V SHUNT INTERVENTION;  Surgeon: Serene Gaile ORN, MD;  Location: [facility] LAB;  Service: Cardiovascular;  Laterality: Left;   AV FISTULA PLACEMENT Left 01/05/2020   Procedure: LEFT ARM BRACHIOCEPHALIC ARTERIOVENOUS (AV) FISTULA;  Surgeon: Eliza Lonni RAMAN, MD;  Location: [facility] OR;  Service: Vascular;  Laterality: Left;   EMPYEMA DRAINAGE Right 05/17/2017   Procedure: EMPYEMA DRAINAGE AND DECORTICATION;  Surgeon: Army Dallas NOVAK, MD;  Location: [facility] OR;  Service: Thoracic;  Laterality: Right;   FLEXIBLE BRONCHOSCOPY  05/17/2017   Procedure: FLEXIBLE BRONCHOSCOPY;  Surgeon: Army Dallas NOVAK, MD;  Location: [facility] OR;  Service: Thoracic;;   INSERTION OF DIALYSIS CATHETER Right 12/12/2021   Procedure: INSERTION OF RIGHT INTERNAL JUGULAR DIALYSIS CATHETER;  Surgeon: Eliza Lonni RAMAN, MD;  Location: [facility] OR;  Service: Vascular;  Laterality: Right;   INSERTION OF DIALYSIS CATHETER N/A 11/27/2023   Procedure: INSERTION OF DIALYSIS CATHETER;  Surgeon: Serene Gaile ORN, MD;  Location: [facility] OR;  Service: Vascular;  Laterality: N/A;   IR FLUORO GUIDE CV LINE RIGHT  01/01/2020   IR THORACENTESIS ASP PLEURAL SPACE W/IMG GUIDE  05/17/2017   IR US  GUIDE VASC ACCESS RIGHT  01/01/2020   PERIPHERAL VASCULAR BALLOON ANGIOPLASTY  05/23/2023   Procedure: PERIPHERAL VASCULAR BALLOON ANGIOPLASTY;  Surgeon: Norine Manuelita LABOR, MD;  Location: [facility];  Service: Cardiovascular;;   REVISON OF  ARTERIOVENOUS FISTULA Left 12/12/2021   Procedure: REVISION OF LEFT ARM FISTULA BY PLICATION;  Surgeon: Eliza Lonni RAMAN, MD;  Location: [facility] OR;  Service: Vascular;  Laterality: Left;   REVISON OF ARTERIOVENOUS FISTULA Left 11/27/2023   Procedure: Aneurysm plication of left arm;  Surgeon: Serene Gaile ORN, MD;  Location: [facility] OR;  Service: Vascular;  Laterality: Left;   THORACOTOMY/LOBECTOMY  Right 05/17/2017   Procedure: RIGHT MINI THORACOTOMY;  Surgeon: Army Dallas NOVAK, MD;  Location: [facility] OR;  Service: Thoracic;  Laterality: Right;   VENOUS ANGIOPLASTY  11/14/2023   Procedure: VENOUS ANGIOPLASTY;  Surgeon: Serene Gaile ORN, MD;  Location: [facility];  Service: Cardiovascular;;   VIDEO ASSISTED THORACOSCOPY (VATS)/EMPYEMA Right 05/17/2017   Procedure: RIGHT VIDEO ASSISTED THORACOSCOPY (VATS)/EMPYEMA;  Surgeon: Army Dallas NOVAK, MD;  Location: [facility] OR;  Service: Thoracic;  Laterality: Right;   Family History  Family History  Problem Relation Age of Onset   Hypertension Mother    Lung cancer Father    Social History  reports that he has never smoked. He has never been exposed to tobacco smoke. He has never used smokeless tobacco. He reports that he does not drink alcohol and does not use drugs. Allergies  Allergies  Allergen Reactions   Hydrocodone  Itching   Oxycodone  Itching   Home medications Prior to Admission medications   Medication Sig Start Date End Date Taking? Authorizing Provider  Accu-Chek Softclix Lancets lancets Use as directed.  For insulin -dependent type 2 diabetes Monitor  glucose 3 times a day Patient taking differently: 1 each by Other route See admin instructions. Use as directed.  For insulin -dependent type 2 diabetes Monitor  glucose 3 times a day 01/06/20   Jerri Keys, MD  acetaminophen  (TYLENOL ) 325 MG tablet Take 2 tablets (650 mg total) by mouth every 6 (six) hours as needed for mild pain (pain score 1-3) (or Fever >/= 101). 06/13/23   Rosendo Rush, MD  albuterol  (PROVENTIL ) (2.5 MG/3ML) 0.083% nebulizer solution Take 3 mLs (2.5 mg total) by nebulization every 6 (six) hours as needed for wheezing or shortness of breath. 06/13/23   Rosendo Rush, MD  albuterol  (VENTOLIN  HFA) 108 [phone] Base) MCG/ACT inhaler Inhale 2 puffs into the lungs every 6 (six) hours as needed for wheezing or shortness of breath. 09/25/22   Iva Marty Saltness, MD  amLODipine   (NORVASC ) 10 MG tablet Take 1 tablet (10 mg total) by mouth daily. 06/13/23   Rosendo Rush, MD  arformoterol  (BROVANA ) 15 MCG/2ML NEBU Take 2 mLs (15 mcg total) by nebulization 2 (two) times daily. 08/07/23   Kara Dorn NOVAK, MD  benzonatate  (TESSALON ) 100 MG capsule Take 200 mg by mouth 3 (three) times daily as needed for cough. 04/18/23   [provider]  budesonide  (PULMICORT ) 0.5 MG/2ML nebulizer solution Take 2 mLs (0.5 mg total) by nebulization 2 (two) times daily. 08/07/23   Kara Dorn NOVAK, MD  busPIRone  (BUSPAR ) 5 MG tablet Take 5 mg by mouth 2 (two) times daily. 02/05/23   [provider]  calcitRIOL  (ROCALTROL ) 0.25 MCG capsule Take 1 capsule (0.25 mcg total) by mouth every Monday, Wednesday, and Friday. 01/08/20   Jerri Keys, MD  camphor-menthol  [facility]) lotion Apply topically 2 (two) times daily. Patient taking differently: Apply 1 Application topically 2 (two) times daily. 12/03/22   Nicholas Bar, MD  carvedilol  (COREG ) 25 MG tablet Take 1 tablet (25 mg total) by mouth 2 (two) times daily with a meal. 05/24/22   Jolaine Pac, DO  famotidine  (  PEPCID ) 10 MG tablet Take 1 tablet (10 mg total) by mouth daily. 06/13/23   Rosendo Rush, MD  fluticasone  (FLONASE ) 50 MCG/ACT nasal spray Place 1 spray into both nostrils daily. 08/07/23   Kara Dorn NOVAK, MD  gabapentin  (NEURONTIN ) 300 MG capsule Take 1 capsule (300 mg total) by mouth daily. 01/25/23   Lenard Calin, MD  glucose blood test strip Use as instructed For insulin -dependent type 2 diabetes Monitor  glucose 3 times a day Patient taking differently: 1 each by Other route See admin instructions. Use as instructed For insulin -dependent type 2 diabetes Monitor  glucose 3 times a day 01/06/20   Jerri Keys, MD  hydrALAZINE  (APRESOLINE ) 50 MG tablet Take 1 tablet (50 mg total) by mouth 3 (three) times daily. Patient taking differently: Take 50 mg by mouth 3 (three) times daily. Patient out of medication 04/08/2023, for  about a month. 02/19/23 10/18/23  Elnora Ip, MD  hydrOXYzine  (ATARAX ) 25 MG tablet Take 25 mg by mouth daily. 02/08/23   [provider]  ipratropium (ATROVENT ) 0.03 % nasal spray Place 2 sprays into both nostrils every 12 (twelve) hours. 08/07/23   Kara Dorn NOVAK, MD  Iron Sucrose  (VENOFER  IV) Inject 50 mg into the vein See admin instructions. Given on days of Dialysis: Monday, Wednesday, and Friday. 03/08/23 03/06/24  [provider]  levocetirizine (XYZAL ) 5 MG tablet Take 1 tablet (5 mg total) by mouth every evening. 09/25/22   Iva Marty Saltness, MD  lidocaine  (XYLOCAINE ) 2 % jelly Apply 1 Application topically daily. 11/26/22   Dreama Longs, MD  multivitamin (RENA-VIT) TABS tablet Take 1 tablet by mouth at bedtime. 05/24/22   Jolaine Pac, DO  ondansetron  (ZOFRAN -ODT) 4 MG disintegrating tablet Take 4 mg by mouth daily as needed for vomiting or nausea. 04/18/23   [provider]  pantoprazole  (PROTONIX ) 40 MG tablet Take 40 mg by mouth daily.    [provider]  polyethylene glycol powder (GLYCOLAX /MIRALAX ) 17 GM/SCOOP powder Mix as directed and take 1 capful (17 g) by mouth daily. Patient taking differently: Take 17 g by mouth daily as needed for mild constipation. 02/20/23   Elnora Ip, MD  promethazine  (PHENERGAN ) 6.25 MG/5ML solution Take 10 mLs (12.5 mg total) by mouth every 6 (six) hours for 14 days. 06/13/23 10/18/23  Rosendo Rush, MD  revefenacin  (YUPELRI ) 175 MCG/3ML nebulizer solution Take 3 mLs (175 mcg total) by nebulization daily. 08/07/23   Kara Dorn NOVAK, MD  SENSIPAR  60 MG tablet Take 60 mg by mouth daily. 06/20/23   [provider]  sevelamer  carbonate (RENVELA ) 800 MG tablet Take 3 tablets (2,400 mg total) by mouth 3 (three) times daily with meals. 12/03/22   Nicholas Bar, MD  tiZANidine  (ZANAFLEX ) 4 MG tablet Take 4 mg by mouth as needed. 12/11/22   [provider]  traMADol  (ULTRAM ) 50 MG  tablet Take 1 tablet (50 mg total) by mouth every 6 (six) hours as needed. 11/27/23 11/26/24  Schuh, McKenzi P, PA-C  Vitamin D , Ergocalciferol , (DRISDOL ) 1.25 MG (50000 UNIT) CAPS capsule Take 50,000 Units by mouth See admin instructions. Take one tablet by mouth on Monday, Wednesdays and Fridays per patient 08/17/19   [provider]  XPHOZAH  30 MG TABS Take 1 tablet by mouth 2 (two) times daily. 01/15/23   [provider]  insulin  aspart protamine  - aspart (NOVOLOG  MIX 70/30 FLEXPEN) (70-30) 100 UNIT/ML FlexPen Inject 15-30 Units into the skin 2 (two) times daily.  05/24/22  [provider]  Vitals:   12/09/23 0730 12/09/23 0730 12/09/23 0815 12/09/23 0900  BP: (!) 192/111  (!) 151/82 (!) 155/85  Pulse: 84  91 87  Resp: 17  (!) 26 17  Temp:      TempSrc:      SpO2: 98% 100% 97% 98%   Exam Gen alert, mild ^wob, [place] O2 No rash, cyanosis or gangrene Sclera anicteric, throat clear  No jvd or bruits Chest soft basilar crackles bilat RRR no MRG Abd soft ntnd no mass or ascites +bs GU deferred MS no joint effusions or deformity Ext 1+ LE edema, no other edema Neuro is alert, Ox 3 , nf    RIJ TDC/ LUA AVF +bruit , post-revision w/ staples in place  Home bp meds: Norvasc  10 Coreg  25 bid Hydralazine  50 tid (ran out of x 1 mo)    OP HD: MWF SW 4h   B400   100kg  2K bath   RIJ TDC / (AVF healing post-revision)  Heparin  4000 Idwg 2.5- 5 kg, coming off 4-8kg over the last 2 wks Good compliance, stays on machine  Cxr - bilat pulm edema  Assessment/ Plan: SOB/ vol overload: from missed HD Fri/ today. CXR + bilat edema. Sig SOB. Plan HD next open spot upstairs.  ESRD: on HD MWF. Missed HD Friday. HD today as above.  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 No esa needs.        Myer Fret  MD [facility] 12/09/2023, 10:01  AM  Recent Labs  Lab 12/09/23 0659  HGB 9.8*  CALCIUM  8.6*  CREATININE 16.51*  K 5.2*   Inpatient medications:

## 2023-12-09 NOTE — ED Notes (Signed)
Patient was given something to eat and drink  

## 2023-12-09 NOTE — Procedures (Signed)
 S: pt seen in HD unit, stable at this time  Vitals:   12/09/23 1045 12/09/23 1216 12/09/23 1241 12/09/23 1330  BP: (!) 162/93 (!) 155/86  (!) 172/105  Pulse: 86 83 84 88  Resp: 19 (!) 21 20 19   Temp:  98.2 F (36.8 C)    TempSrc:      SpO2: 99% 98% 99% 99%    Recent Labs  Lab 12/09/23 0659  HGB 9.8*  CALCIUM  8.6*  CREATININE 16.51*  K 5.2*    Inpatient medications:  Chlorhexidine  Gluconate Cloth  6 each Topical Q0600    anticoagulant sodium citrate      alteplase , anticoagulant sodium citrate , feeding supplement (NEPRO CARB STEADY), heparin , heparin , lidocaine  (PF), lidocaine -prilocaine , pentafluoroprop-tetrafluoroeth  I was present at the procedure, reviewed the HD regimen and made appropriate changes.   Myer Fret MD  [facility] 12/09/2023, 1:49 PM

## 2023-12-12 LAB — HEPATITIS B SURFACE ANTIBODY, QUANTITATIVE: Hep B S AB Quant (Post): 1813 m[IU]/mL

## 2023-12-16 ENCOUNTER — Ambulatory Visit (HOSPITAL_BASED_OUTPATIENT_CLINIC_OR_DEPARTMENT_OTHER): Admitting: Family

## 2023-12-19 ENCOUNTER — Ambulatory Visit

## 2023-12-19 ENCOUNTER — Ambulatory Visit: Payer: Self-pay | Admitting: Cardiology

## 2023-12-19 ENCOUNTER — Telehealth: Payer: Self-pay

## 2023-12-19 DIAGNOSIS — I480 Paroxysmal atrial fibrillation: Secondary | ICD-10-CM | POA: Diagnosis not present

## 2023-12-19 LAB — CUP PACEART REMOTE DEVICE CHECK
Date Time Interrogation Session: 20250911100655
Implantable Pulse Generator Implant Date: 20250711

## 2023-12-19 NOTE — Telephone Encounter (Signed)
 VM/LM ok per DPR -  [facility] is trying to reach out about INH  please call them   [phone]

## 2023-12-23 ENCOUNTER — Ambulatory Visit: Attending: Surgery | Admitting: Physician Assistant

## 2023-12-23 ENCOUNTER — Encounter: Payer: Self-pay | Admitting: Physician Assistant

## 2023-12-23 VITALS — BP 142/90 | HR 83 | Temp 97.7°F | Wt 239.6 lb

## 2023-12-23 DIAGNOSIS — N186 End stage renal disease: Secondary | ICD-10-CM

## 2023-12-26 NOTE — Progress Notes (Signed)
 POST OPERATIVE OFFICE NOTE    CC:  F/u for surgery  HPI: [name] is a 58 y.o. male who is here for postop check.  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 Serene.   He returns today for follow-up.  He has no complaints at today's visit.  He says that his catheter is working well at dialysis.  He denies any issues with his left arm incision such as drainage, redness, dehiscence, or tenderness.   Allergies  Allergen Reactions   Hydrocodone  Itching   Oxycodone  Itching    Current Outpatient Medications  Medication Sig Dispense Refill   Accu-Chek Softclix Lancets lancets Use as directed.  For insulin -dependent type 2 diabetes Monitor  glucose 3 times a day (Patient taking differently: 1 each by Other route See admin instructions. Use as directed.  For insulin -dependent type 2 diabetes Monitor  glucose 3 times a day) 100 each 0   acetaminophen  (TYLENOL ) 325 MG tablet Take 2 tablets (650 mg total) by mouth every 6 (six) hours as needed for mild pain (pain score 1-3) (or Fever >/= 101).     albuterol  (PROVENTIL ) (2.5 MG/3ML) 0.083% nebulizer solution Take 3 mLs (2.5 mg total) by nebulization every 6 (six) hours as needed for wheezing or shortness of breath. 75 mL 1   albuterol  (VENTOLIN  HFA) 108 (90 Base) MCG/ACT inhaler Inhale 2 puffs into the lungs every 6 (six) hours as needed for wheezing or shortness of breath. 18 g 1   amLODipine  (NORVASC ) 10 MG tablet Take 1 tablet (10 mg total) by mouth daily. 30 tablet 0   arformoterol  (BROVANA ) 15 MCG/2ML NEBU Take 2 mLs (15 mcg total) by nebulization 2 (two) times daily. 120 mL 6   benzonatate  (TESSALON ) 100 MG capsule Take 200 mg by mouth 3 (three) times daily as needed for cough.     budesonide  (PULMICORT ) 0.5 MG/2ML nebulizer solution Take 2 mLs (0.5 mg total) by nebulization 2 (two) times daily. 120 mL 12   busPIRone  (BUSPAR ) 5  MG tablet Take 5 mg by mouth 2 (two) times daily.     calcitRIOL  (ROCALTROL ) 0.25 MCG capsule Take 1 capsule (0.25 mcg total) by mouth every Monday, Wednesday, and Friday. 12 capsule 0   camphor-menthol  (SARNA) lotion Apply topically 2 (two) times daily. (Patient taking differently: Apply 1 Application topically 2 (two) times daily.) 222 mL 0   carvedilol  (COREG ) 25 MG tablet Take 1 tablet (25 mg total) by mouth 2 (two) times daily with a meal. 60 tablet 0   famotidine  (PEPCID ) 10 MG tablet Take 1 tablet (10 mg total) by mouth daily. 30 tablet 0   fluticasone  (FLONASE ) 50 MCG/ACT nasal spray Place 1 spray into both nostrils daily. 16 g 2   gabapentin  (NEURONTIN ) 300 MG capsule Take 1 capsule (300 mg total) by mouth daily. 14 capsule 0   glucose blood test strip Use as instructed For insulin -dependent type 2 diabetes Monitor  glucose 3 times a day (Patient taking differently: 1 each by Other route See admin instructions. Use as instructed For insulin -dependent type 2 diabetes Monitor  glucose 3 times a day) 100 each 0   hydrALAZINE  (APRESOLINE ) 50 MG tablet Take 1 tablet (50 mg total) by mouth 3 (three) times daily. (Patient taking differently: Take 50 mg by mouth 3 (three) times daily. Patient out of medication 04/08/2023, for about a month.) 90 tablet 0  hydrOXYzine  (ATARAX ) 25 MG tablet Take 25 mg by mouth daily.     ipratropium (ATROVENT ) 0.03 % nasal spray Place 2 sprays into both nostrils every 12 (twelve) hours. 30 mL 12   Iron Sucrose  (VENOFER  IV) Inject 50 mg into the vein See admin instructions. Given on days of Dialysis: Monday, Wednesday, and Friday.     levocetirizine (XYZAL ) 5 MG tablet Take 1 tablet (5 mg total) by mouth every evening. 30 tablet 5   lidocaine  (XYLOCAINE ) 2 % jelly Apply 1 Application topically daily. 50 mL 0   multivitamin (RENA-VIT) TABS tablet Take 1 tablet by mouth at bedtime. 30 tablet 0   ondansetron  (ZOFRAN -ODT) 4 MG disintegrating tablet Take 4 mg by mouth  daily as needed for vomiting or nausea.     pantoprazole  (PROTONIX ) 40 MG tablet Take 40 mg by mouth daily.     polyethylene glycol powder (GLYCOLAX /MIRALAX ) 17 GM/SCOOP powder Mix as directed and take 1 capful (17 g) by mouth daily. (Patient taking differently: Take 17 g by mouth daily as needed for mild constipation.) 238 g 0   promethazine  (PHENERGAN ) 6.25 MG/5ML solution Take 10 mLs (12.5 mg total) by mouth every 6 (six) hours for 14 days. 560 mL 0   revefenacin  (YUPELRI ) 175 MCG/3ML nebulizer solution Take 3 mLs (175 mcg total) by nebulization daily. 90 mL 11   SENSIPAR  60 MG tablet Take 60 mg by mouth daily.     sevelamer  carbonate (RENVELA ) 800 MG tablet Take 3 tablets (2,400 mg total) by mouth 3 (three) times daily with meals. 270 tablet 0   tiZANidine  (ZANAFLEX ) 4 MG tablet Take 4 mg by mouth as needed.     traMADol  (ULTRAM ) 50 MG tablet Take 1 tablet (50 mg total) by mouth every 6 (six) hours as needed. 15 tablet 0   Vitamin D , Ergocalciferol , (DRISDOL ) 1.25 MG (50000 UNIT) CAPS capsule Take 50,000 Units by mouth See admin instructions. Take one tablet by mouth on Monday, Wednesdays and Fridays per patient     XPHOZAH  30 MG TABS Take 1 tablet by mouth 2 (two) times daily.     No current facility-administered medications for this visit.     ROS:  See HPI  Physical Exam:  Incision: Left upper arm incision nearly healed with small scabbing present, running 3-0 nylon suture was removed Extremities: Palpable left radial pulse.  Left upper arm fistula with good thrill Neuro: Intact motor and sensation of left upper extremity    Assessment/Plan:  This is a 58 y.o. 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  He also had a right IJ TDC placed at the time of surgery -His left arm incision is nearly healed without signs of infection.  I removed the running 3-0 nylon suture from his skin.   There is a small amount of scabbing present, which should go away within the next week - His left upper arm fistula has a good thrill on palpation with minimal pulsatility -His left arm is well-perfused with a palpable radial pulse.  He has intact motor and sensation of the left hand - Dialysis can attempt to use the fistula again starting on 12/30/2023.  Once nephrology is satisfied with his fistula function, the patient can call our office for [facility] removal   Ahmed Holster, PA-C [facility] [phone]   Clinic MD:  Serene

## 2023-12-27 NOTE — Progress Notes (Signed)
 Remote Loop Recorder Transmission

## 2024-01-02 NOTE — Progress Notes (Signed)
 Remote Loop Recorder Transmission

## 2024-01-07 ENCOUNTER — Other Ambulatory Visit (HOSPITAL_COMMUNITY): Payer: Self-pay | Admitting: Nephrology

## 2024-01-07 DIAGNOSIS — N186 End stage renal disease: Secondary | ICD-10-CM

## 2024-01-13 ENCOUNTER — Other Ambulatory Visit: Payer: Self-pay | Admitting: Student

## 2024-01-14 ENCOUNTER — Ambulatory Visit (HOSPITAL_COMMUNITY)
Admission: RE | Admit: 2024-01-14 | Discharge: 2024-01-14 | Disposition: A | Discharge status: Home / Self Care | Source: Ambulatory Visit | Attending: Nephrology | Admitting: Nephrology

## 2024-01-14 DIAGNOSIS — N186 End stage renal disease: Secondary | ICD-10-CM | POA: Insufficient documentation

## 2024-01-14 DIAGNOSIS — Z4901 Encounter for fitting and adjustment of extracorporeal dialysis catheter: Secondary | ICD-10-CM | POA: Insufficient documentation

## 2024-01-14 HISTORY — PX: IR REMOVAL TUN CV CATH W/O FL: IMG2289

## 2024-01-14 MED ORDER — LIDOCAINE HCL 1 % IJ SOLN
INTRAMUSCULAR | Status: AC
Start: 2024-01-14 — End: 2024-01-14
  Filled 2024-01-14: qty 20

## 2024-01-19 ENCOUNTER — Encounter

## 2024-01-20 ENCOUNTER — Ambulatory Visit

## 2024-01-20 ENCOUNTER — Encounter

## 2024-01-20 DIAGNOSIS — I480 Paroxysmal atrial fibrillation: Secondary | ICD-10-CM | POA: Diagnosis not present

## 2024-01-20 LAB — CUP PACEART REMOTE DEVICE CHECK
Date Time Interrogation Session: 20251012235245
Implantable Pulse Generator Implant Date: 20250711

## 2024-01-21 NOTE — Progress Notes (Signed)
 Remote Loop Recorder Transmission

## 2024-01-29 ENCOUNTER — Ambulatory Visit: Payer: Self-pay | Admitting: Cardiology

## 2024-02-04 ENCOUNTER — Encounter (HOSPITAL_COMMUNITY): Payer: Self-pay

## 2024-02-05 ENCOUNTER — Encounter (HOSPITAL_COMMUNITY): Payer: Self-pay | Admitting: Vascular Surgery

## 2024-02-05 ENCOUNTER — Ambulatory Visit (HOSPITAL_COMMUNITY)
Admission: RE | Admit: 2024-02-05 | Discharge: 2024-02-05 | Disposition: A | Discharge status: Home / Self Care | Attending: Vascular Surgery | Admitting: Vascular Surgery

## 2024-02-05 ENCOUNTER — Other Ambulatory Visit: Payer: Self-pay

## 2024-02-05 ENCOUNTER — Encounter (HOSPITAL_COMMUNITY)
Admission: RE | Disposition: A | Discharge status: Home / Self Care | Payer: Self-pay | Source: Home / Self Care | Attending: Vascular Surgery

## 2024-02-05 DIAGNOSIS — N186 End stage renal disease: Secondary | ICD-10-CM | POA: Diagnosis present

## 2024-02-05 DIAGNOSIS — I871 Compression of vein: Secondary | ICD-10-CM | POA: Diagnosis not present

## 2024-02-05 DIAGNOSIS — I132 Hypertensive heart and chronic kidney disease with heart failure and with stage 5 chronic kidney disease, or end stage renal disease: Secondary | ICD-10-CM | POA: Insufficient documentation

## 2024-02-05 DIAGNOSIS — Z992 Dependence on renal dialysis: Secondary | ICD-10-CM | POA: Diagnosis not present

## 2024-02-05 DIAGNOSIS — I509 Heart failure, unspecified: Secondary | ICD-10-CM | POA: Insufficient documentation

## 2024-02-05 DIAGNOSIS — T82858A Stenosis of vascular prosthetic devices, implants and grafts, initial encounter: Secondary | ICD-10-CM | POA: Diagnosis not present

## 2024-02-05 DIAGNOSIS — E1122 Type 2 diabetes mellitus with diabetic chronic kidney disease: Secondary | ICD-10-CM | POA: Insufficient documentation

## 2024-02-05 DIAGNOSIS — Y832 Surgical operation with anastomosis, bypass or graft as the cause of abnormal reaction of the patient, or of later complication, without mention of misadventure at the time of the procedure: Secondary | ICD-10-CM | POA: Insufficient documentation

## 2024-02-05 HISTORY — PX: VENOUS STENT: CATH118377

## 2024-02-05 HISTORY — PX: A/V FISTULAGRAM: CATH118298

## 2024-02-05 HISTORY — PX: VENOUS ANGIOPLASTY: CATH118376

## 2024-02-05 LAB — GLUCOSE, CAPILLARY: Glucose-Capillary: 189 mg/dL — ABNORMAL HIGH (ref 70–99)

## 2024-02-05 MED ORDER — HEPARIN (PORCINE) IN NACL 1000-0.9 UT/500ML-% IV SOLN
INTRAVENOUS | Status: DC | PRN
  Administered 2024-02-05: 500 mL

## 2024-02-05 MED ORDER — IODIXANOL 320 MG/ML IV SOLN
INTRAVENOUS | Status: DC | PRN
Start: 2024-02-05 — End: 2024-02-05
  Administered 2024-02-05: 30 mL via INTRAVENOUS

## 2024-02-05 MED ORDER — LIDOCAINE HCL (PF) 1 % IJ SOLN
INTRAMUSCULAR | Status: DC | PRN
Start: 2024-02-05 — End: 2024-02-05
  Administered 2024-02-05: 5 mL

## 2024-02-05 NOTE — Op Note (Signed)
    Patient name: [name] Resor MRN: [id number] DOB: [birth year] Sex: male  02/05/2024 Pre-operative Diagnosis: ESRD on HD Post-operative diagnosis:  Same Surgeon:  Norman GORMAN Serve, MD Procedure Performed:  Ultrasound-guided access of dialysis circuit, fistulogram and central venogram, peripheral angioplasty and stenting.  [id number] Additional peripheral angioplasty different from stent site.  Indications: [name] is a 58 year old male with ESRD presenting today HD access center for fistulogram.  He has been having issues with difficulty with cannulation.  He recently underwent a fistulogram in August with Dr. Serene where cephalic vein stenosis and a cephalic arch stenoses were treated with a 10 mm Mustang balloon.  Recent benefits of fistulogram and intervention were reviewed and he elected to proceed.  Findings:  Patent central venous system.  Approximate 85% stenosis of the cephalic arch that was previously treated.  Tandem stenosis in the cephalic vein near the head of the humerus approximately 60%.  Previous peripheral cephalic vein stent widely patent.  Anastomosis patent.   Procedure:  The patient was identified in the holding area and taken to the cath lab  The patient was then placed supine on the table and prepped and draped in the usual sterile fashion.  A time out was called.  Ultrasound was used to evaluate the left arm AV access. This was accessed under u/s guidance. An 018 wire was advanced without resistance, a micropuncture sheath was placed and fistulagram obtained which demonstrated the above findings.  This access was then upsized to a 47F short sheath over a glidewire.  A Glidewire was then used to cross the lesions.  These were treated first with a 10 mm x 60 mm Mustang balloon.  The more peripheral stenosis appeared improved although the cephalic arch recoiled.  Therefore this was stented with a 10 mm x 60 mm bare-metal epic stent and this was postdilated with a 10 mm Mustang  balloon.  Completion angiogram demonstrated improvement in the flow and minimal residual stenosis.  The wire and sheath were removed and the access was managed with a 4-0 Monocryl figure-of-eight suture for hemostasis.  Contrast: 30 cc Sedation: None  Impression: Successful stenting of the cephalic arch, 10 mm epic stent.  Additional peripheral angioplasty in the cephalic vein. Remains amenable to endovascular treatment.   Norman GORMAN Serve MD [facility] [place] Office: [phone]

## 2024-02-05 NOTE — H&P (Signed)
 HD ACCESS CENTER H&P   Patient ID: Roger Kettles Aron, male   DOB: [birth year], 58 y.o.   MRN: [id number]  Subjective:     HPI Yusuf Yu Ridinger is a 58 y.o. male with ESRD presenting to the HD access center for intervention.  Past Medical History:  Diagnosis Date   Adjustment disorder with mixed anxiety and depressed mood 12/28/2014   Asthma    Bacteremia due to Pseudomonas 12/01/2022   CHF (congestive heart failure) (HCC)    CPAP (continuous positive airway pressure) dependence    Diabetes mellitus without complication (HCC)    DM (diabetes mellitus) type II controlled with renal manifestation (HCC) 12/28/2014   DM (diabetes mellitus) type II controlled, neurological manifestation (HCC) 12/28/2014   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HCC)    Encephalopathy, hypertensive 12/28/2014   ESRD on hemodialysis (HCC)    MWF at SW / [facility]   GERD (gastroesophageal reflux disease)    Hypertension    Hypertensive emergency without congestive heart failure 12/27/2014   Pleural effusion    Pneumonia    Possible Panic disorder 12/28/2014   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   Sleep apnea    Family History  Problem Relation Age of Onset   Hypertension Mother    Lung cancer Father    Past Surgical History:  Procedure Laterality Date   A/V FISTULAGRAM N/A 05/23/2023   Procedure: A/V Fistulagram;  Surgeon: Norine Manuelita LABOR, MD;  [facility]: [facility];  Service: Cardiovascular;  Laterality: N/A;   A/V SHUNT INTERVENTION Left 11/14/2023   Procedure: A/V SHUNT INTERVENTION;  Surgeon: Serene Gaile ORN, MD;  Location: [facility];  Service: Cardiovascular;  Laterality: Left;   AV FISTULA PLACEMENT Left 01/05/2020   Procedure: LEFT ARM BRACHIOCEPHALIC ARTERIOVENOUS (AV) FISTULA;  Surgeon: Eliza Lonni RAMAN, MD;  Location: [facility] OR;  Service: Vascular;  Laterality: Left;   EMPYEMA DRAINAGE Right 05/17/2017   Procedure: EMPYEMA DRAINAGE AND DECORTICATION;  Surgeon: Army Dallas NOVAK, MD;  Location: [facility] OR;   Service: Thoracic;  Laterality: Right;   FLEXIBLE BRONCHOSCOPY  05/17/2017   Procedure: FLEXIBLE BRONCHOSCOPY;  Surgeon: Army Dallas NOVAK, MD;  Location: [facility] OR;  Service: Thoracic;;   INSERTION OF DIALYSIS CATHETER Right 12/12/2021   Procedure: INSERTION OF RIGHT INTERNAL JUGULAR DIALYSIS CATHETER;  Surgeon: Eliza Lonni RAMAN, MD;  Location: [facility] OR;  Service: Vascular;  Laterality: Right;   INSERTION OF DIALYSIS CATHETER N/A 11/27/2023   Procedure: INSERTION OF DIALYSIS CATHETER;  Surgeon: Serene Gaile ORN, MD;  Location: [facility] OR;  Service: Vascular;  Laterality: N/A;   IR FLUORO GUIDE CV LINE RIGHT  01/01/2020   IR REMOVAL TUN CV CATH W/O FL  01/14/2024   IR THORACENTESIS ASP PLEURAL SPACE W/IMG GUIDE  05/17/2017   IR US  GUIDE VASC ACCESS RIGHT  01/01/2020   PERIPHERAL VASCULAR BALLOON ANGIOPLASTY  05/23/2023   Procedure: PERIPHERAL VASCULAR BALLOON ANGIOPLASTY;  Surgeon: Norine Manuelita LABOR, MD;  [facility]: [facility];  Service: Cardiovascular;;   REVISON OF ARTERIOVENOUS FISTULA Left 12/12/2021   Procedure: REVISION OF LEFT ARM FISTULA BY PLICATION;  Surgeon: Eliza Lonni RAMAN, MD;  Location: [facility] OR;  Service: Vascular;  Laterality: Left;   REVISON OF ARTERIOVENOUS FISTULA Left 11/27/2023   Procedure: Aneurysm plication of left arm;  Surgeon: Serene Gaile ORN, MD;  Location: [facility] OR;  Service: Vascular;  Laterality: Left;   THORACOTOMY/LOBECTOMY Right 05/17/2017   Procedure: RIGHT MINI THORACOTOMY;  Surgeon: Army Dallas NOVAK, MD;  Location: [facility] OR;  Service: Thoracic;  Laterality: Right;   VENOUS ANGIOPLASTY  11/14/2023   Procedure: VENOUS ANGIOPLASTY;  Surgeon: Serene Gaile ORN, MD;  Location: [facility];  Service: Cardiovascular;;   VIDEO ASSISTED THORACOSCOPY (VATS)/EMPYEMA Right 05/17/2017   Procedure: RIGHT VIDEO ASSISTED THORACOSCOPY (VATS)/EMPYEMA;  Surgeon: Army Dallas NOVAK, MD;  Location: [facility] OR;  Service: Thoracic;  Laterality: Right;    Short Social History:  Social  History   Tobacco Use   Smoking status: Never    Passive exposure: Never   Smokeless tobacco: Never  Substance Use Topics   Alcohol use: No    Allergies  Allergen Reactions   Hydrocodone  Itching   Oxycodone  Itching    No current facility-administered medications for this encounter.    REVIEW OF SYSTEMS All other systems were reviewed and are negative     Objective:   Objective   Vitals:   02/05/24 1028  BP: (!) 170/97  Pulse: 87  Resp: 12  Temp: 97.6 F (36.4 C)  TempSrc: Oral  SpO2: 96%  Weight: 101.2 kg  Height: 5' 7 (1.702 m)   Body mass index is 34.93 kg/m.  Physical Exam General: no acute distress Cardiac: hemodynamically stable Extremities: Palpable thrill in left arm aVF  Data: Reviewed fistulogram from August from Dr. Serene. Tandem cephalic vein arch and cephalic vein stenosis treated with 10 mm Mustang.     Assessment/Plan:   Kyrillos Adams Lips is a 58 y.o. male with ESRD presenting for fistulogram with intervention.  Having issues with difficult cannulation Reviewed risks and benefits of fistulogram with intervention and patient agreed to proceed.   Norman Serve, MD [facility]

## 2024-02-12 ENCOUNTER — Other Ambulatory Visit: Payer: Self-pay

## 2024-02-12 ENCOUNTER — Emergency Department (HOSPITAL_COMMUNITY)

## 2024-02-12 ENCOUNTER — Emergency Department (HOSPITAL_COMMUNITY): Admission: EM | Admit: 2024-02-12 | Discharge: 2024-02-12 | Disposition: A | Discharge status: Home / Self Care

## 2024-02-12 ENCOUNTER — Encounter (HOSPITAL_COMMUNITY): Payer: Self-pay

## 2024-02-12 DIAGNOSIS — N186 End stage renal disease: Secondary | ICD-10-CM | POA: Diagnosis not present

## 2024-02-12 DIAGNOSIS — J45909 Unspecified asthma, uncomplicated: Secondary | ICD-10-CM | POA: Diagnosis not present

## 2024-02-12 DIAGNOSIS — Z79899 Other long term (current) drug therapy: Secondary | ICD-10-CM | POA: Diagnosis not present

## 2024-02-12 DIAGNOSIS — E1122 Type 2 diabetes mellitus with diabetic chronic kidney disease: Secondary | ICD-10-CM | POA: Insufficient documentation

## 2024-02-12 DIAGNOSIS — Z992 Dependence on renal dialysis: Secondary | ICD-10-CM | POA: Insufficient documentation

## 2024-02-12 DIAGNOSIS — M79602 Pain in left arm: Secondary | ICD-10-CM | POA: Insufficient documentation

## 2024-02-12 DIAGNOSIS — J449 Chronic obstructive pulmonary disease, unspecified: Secondary | ICD-10-CM | POA: Diagnosis not present

## 2024-02-12 DIAGNOSIS — I5030 Unspecified diastolic (congestive) heart failure: Secondary | ICD-10-CM | POA: Diagnosis not present

## 2024-02-12 DIAGNOSIS — I1311 Hypertensive heart and chronic kidney disease without heart failure, with stage 5 chronic kidney disease, or end stage renal disease: Secondary | ICD-10-CM | POA: Insufficient documentation

## 2024-02-12 DIAGNOSIS — R0789 Other chest pain: Secondary | ICD-10-CM | POA: Insufficient documentation

## 2024-02-12 LAB — COMPREHENSIVE METABOLIC PANEL WITH GFR
ALT: 12 U/L (ref 0–44)
AST: 18 U/L (ref 15–41)
Albumin: 3.1 g/dL — ABNORMAL LOW (ref 3.5–5.0)
Alkaline Phosphatase: 119 U/L (ref 38–126)
Anion gap: 12 (ref 5–15)
BUN: 33 mg/dL — ABNORMAL HIGH (ref 6–20)
CO2: 28 mmol/L (ref 22–32)
Calcium: 8.4 mg/dL — ABNORMAL LOW (ref 8.9–10.3)
Chloride: 95 mmol/L — ABNORMAL LOW (ref 98–111)
Creatinine, Ser: 8.81 mg/dL — ABNORMAL HIGH (ref 0.61–1.24)
GFR, Estimated: 6 mL/min — ABNORMAL LOW (ref >60)
Glucose, Bld: 126 mg/dL — ABNORMAL HIGH (ref 70–99)
Potassium: 4.5 mmol/L (ref 3.5–5.1)
Sodium: 135 mmol/L (ref 135–145)
Total Bilirubin: 0.8 mg/dL (ref 0.0–1.2)
Total Protein: 6.9 g/dL (ref 6.5–8.1)

## 2024-02-12 LAB — CBC WITH DIFFERENTIAL/PLATELET
Abs Immature Granulocytes: 0.04 K/uL (ref 0.00–0.07)
Basophils Absolute: 0.1 K/uL (ref 0.0–0.1)
Basophils Relative: 1 %
Eosinophils Absolute: 0.3 K/uL (ref 0.0–0.5)
Eosinophils Relative: 3 %
HCT: 34.9 % — ABNORMAL LOW (ref 39.0–52.0)
Hemoglobin: 11.4 g/dL — ABNORMAL LOW (ref 13.0–17.0)
Immature Granulocytes: 1 %
Lymphocytes Relative: 23 %
Lymphs Abs: 1.8 K/uL (ref 0.7–4.0)
MCH: 31.1 pg (ref 26.0–34.0)
MCHC: 32.7 g/dL (ref 30.0–36.0)
MCV: 95.4 fL (ref 80.0–100.0)
Monocytes Absolute: 0.5 K/uL (ref 0.1–1.0)
Monocytes Relative: 7 %
Neutro Abs: 5.1 K/uL (ref 1.7–7.7)
Neutrophils Relative %: 65 %
Platelets: 306 K/uL (ref 150–400)
RBC: 3.66 MIL/uL — ABNORMAL LOW (ref 4.22–5.81)
RDW: 13.5 % (ref 11.5–15.5)
WBC: 7.8 K/uL (ref 4.0–10.5)
nRBC: 0 % (ref 0.0–0.2)

## 2024-02-12 LAB — TROPONIN I (HIGH SENSITIVITY)
Troponin I (High Sensitivity): 46 ng/L — ABNORMAL HIGH (ref <18)
Troponin I (High Sensitivity): 45 ng/L — ABNORMAL HIGH (ref <18)

## 2024-02-12 NOTE — ED Triage Notes (Signed)
 Pt coming from dialysis. Had angioplasty on fistula October 29th and has had constant left upper chest pain/left shoulder pain. When pain hits pt, c/o SHOB. Pt completed 2/4 hours of dialysis.

## 2024-02-12 NOTE — Discharge Instructions (Signed)
 You were evaluated in the emergency room for left arm and chest pain.  Your lab work and imaging did not show any significant abnormality.  Please resume dialysis as scheduled and follow-up with your primary doctor for further evaluation.

## 2024-02-12 NOTE — ED Provider Notes (Signed)
 [facility] Provider Note   CSN: [id number] Arrival date & time: 02/12/24  9096     Patient presents with: Shoulder Pain and Chest Pain   [name] is a 58 y.o. male history of ESRD on HD (MWF), DM 2, hypertension, A-fib, HFpEF, COPD presents with complaints of left upper extremity pain that radiates into his chest.  Symptoms started early this morning when beginning dialysis.  Was associated with some shortness of breath.  No dizziness or nausea.  Reports that he has been having some left upper extremity pain on and off since fistula placement approximately 2 months ago.  Is without any obvious aggravating relieving factors.  Reports shortness of breath has improved.  Last completed dialysis session was this past Monday.  Patient underwent revision of left upper arm fistula back in August followed by fistulogram with peripheral angioplasty and stenting 10/29.    Shoulder Pain Chest Pain   Past Medical History:  Diagnosis Date   Adjustment disorder with mixed anxiety and depressed mood 12/28/2014   Asthma    Bacteremia due to Pseudomonas 12/01/2022   CHF (congestive heart failure) (HCC)    CPAP (continuous positive airway pressure) dependence    Diabetes mellitus without complication (HCC)    DM (diabetes mellitus) type II controlled with renal manifestation (HCC) 12/28/2014   DM (diabetes mellitus) type II controlled, neurological manifestation (HCC) 12/28/2014   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HCC)    Encephalopathy, hypertensive 12/28/2014   ESRD on hemodialysis (HCC)    MWF at [facility] / [facility]   GERD (gastroesophageal reflux disease)    Hypertension    Hypertensive emergency without congestive heart failure 12/27/2014   Pleural effusion    Pneumonia    Possible Panic disorder 12/28/2014   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   Sleep apnea    Past Surgical History:  Procedure Laterality Date   A/V FISTULAGRAM N/A 05/23/2023   Procedure: A/V  Fistulagram;  Surgeon: Norine Manuelita LABOR, MD;  [facility];  Service: Cardiovascular;  Laterality: N/A;   A/V FISTULAGRAM Left 02/05/2024   Procedure: A/V Fistulagram;  Surgeon: Pearline Norman RAMAN, MD;  Location: [facility];  Service: Cardiovascular;  Laterality: Left;   A/V SHUNT INTERVENTION Left 11/14/2023   Procedure: A/V SHUNT INTERVENTION;  Surgeon: Serene Gaile ORN, MD;  Location: [facility];  Service: Cardiovascular;  Laterality: Left;   AV FISTULA PLACEMENT Left 01/05/2020   Procedure: LEFT ARM BRACHIOCEPHALIC ARTERIOVENOUS (AV) FISTULA;  Surgeon: Eliza Lonni RAMAN, MD;  Location: [facility] OR;  Service: Vascular;  Laterality: Left;   EMPYEMA DRAINAGE Right 05/17/2017   Procedure: EMPYEMA DRAINAGE AND DECORTICATION;  Surgeon: Army Dallas NOVAK, MD;  Location: [facility] OR;  Service: Thoracic;  Laterality: Right;   FLEXIBLE BRONCHOSCOPY  05/17/2017   Procedure: FLEXIBLE BRONCHOSCOPY;  Surgeon: Army Dallas NOVAK, MD;  Location: [facility] OR;  Service: Thoracic;;   INSERTION OF DIALYSIS CATHETER Right 12/12/2021   Procedure: INSERTION OF RIGHT INTERNAL JUGULAR DIALYSIS CATHETER;  Surgeon: Eliza Lonni RAMAN, MD;  [facility];  Service: Vascular;  Laterality: Right;   INSERTION OF DIALYSIS CATHETER N/A 11/27/2023   Procedure: INSERTION OF DIALYSIS CATHETER;  Surgeon: Serene Gaile ORN, MD;  Location: [facility];  Service: Vascular;  Laterality: N/A;   IR FLUORO GUIDE CV LINE RIGHT  01/01/2020   IR REMOVAL TUN CV CATH W/O FL  01/14/2024   IR THORACENTESIS ASP PLEURAL SPACE W/IMG GUIDE  05/17/2017   IR US  GUIDE VASC  ACCESS RIGHT  01/01/2020   PERIPHERAL VASCULAR BALLOON ANGIOPLASTY  05/23/2023   Procedure: PERIPHERAL VASCULAR BALLOON ANGIOPLASTY;  Surgeon: Norine Manuelita LABOR, MD;  [facility];  Service: Cardiovascular;;   REVISON OF ARTERIOVENOUS FISTULA Left 12/12/2021   Procedure: REVISION OF LEFT ARM FISTULA BY PLICATION;  Surgeon: Eliza Lonni RAMAN, MD;  Location:  [facility] OR;  Service: Vascular;  Laterality: Left;   REVISON OF ARTERIOVENOUS FISTULA Left 11/27/2023   Procedure: Aneurysm plication of left arm;  Surgeon: Serene Gaile ORN, MD;  Location: [facility] OR;  Service: Vascular;  Laterality: Left;   THORACOTOMY/LOBECTOMY Right 05/17/2017   Procedure: RIGHT MINI THORACOTOMY;  Surgeon: Army Dallas NOVAK, MD;  Location: [facility] OR;  Service: Thoracic;  Laterality: Right;   VENOUS ANGIOPLASTY  11/14/2023   Procedure: VENOUS ANGIOPLASTY;  Surgeon: Serene Gaile ORN, MD;  Location: [facility];  Service: Cardiovascular;;   VENOUS ANGIOPLASTY  02/05/2024   Procedure: VENOUS ANGIOPLASTY;  Surgeon: Pearline Norman RAMAN, MD;  Location: [facility];  Service: Cardiovascular;;   VENOUS STENT  02/05/2024   Procedure: VENOUS STENT;  Surgeon: Pearline Norman RAMAN, MD;  Location: [facility];  Service: Cardiovascular;;  85%cephalic arch   VIDEO ASSISTED THORACOSCOPY (VATS)/EMPYEMA Right 05/17/2017   Procedure: RIGHT VIDEO ASSISTED THORACOSCOPY (VATS)/EMPYEMA;  Surgeon: Army Dallas NOVAK, MD;  Location: [facility] OR;  Service: Thoracic;  Laterality: Right;       Prior to Admission medications   Medication Sig Start Date End Date Taking? Authorizing Provider  Accu-Chek Softclix Lancets lancets Use as directed.  For insulin -dependent type 2 diabetes Monitor  glucose 3 times a day Patient taking differently: 1 each by Other route See admin instructions. Use as directed.  For insulin -dependent type 2 diabetes Monitor  glucose 3 times a day 01/06/20   Jerri Keys, MD  acetaminophen  (TYLENOL ) 325 MG tablet Take 2 tablets (650 mg total) by mouth every 6 (six) hours as needed for mild pain (pain score 1-3) (or Fever >/= 101). 06/13/23   Rosendo Rush, MD  albuterol  (PROVENTIL ) (2.5 MG/3ML) 0.083% nebulizer solution Take 3 mLs (2.5 mg total) by nebulization every 6 (six) hours as needed for wheezing or shortness of breath. 06/13/23   Rosendo Rush, MD  albuterol  (VENTOLIN  HFA) 108 [id number] Base) MCG/ACT inhaler  Inhale 2 puffs into the lungs every 6 (six) hours as needed for wheezing or shortness of breath. 09/25/22   Iva Marty Saltness, MD  amLODipine  (NORVASC ) 10 MG tablet Take 1 tablet (10 mg total) by mouth daily. 06/13/23   Rosendo Rush, MD  arformoterol  (BROVANA ) 15 MCG/2ML NEBU Take 2 mLs (15 mcg total) by nebulization 2 (two) times daily. 08/07/23   Kara Dorn NOVAK, MD  benzonatate  (TESSALON ) 100 MG capsule Take 200 mg by mouth 3 (three) times daily as needed for cough. 04/18/23   [provider]  budesonide  (PULMICORT ) 0.5 MG/2ML nebulizer solution Take 2 mLs (0.5 mg total) by nebulization 2 (two) times daily. 08/07/23   Kara Dorn NOVAK, MD  busPIRone  (BUSPAR ) 5 MG tablet Take 5 mg by mouth 2 (two) times daily. 02/05/23   [provider]  calcitRIOL  (ROCALTROL ) 0.25 MCG capsule Take 1 capsule (0.25 mcg total) by mouth every Monday, Wednesday, and Friday. 01/08/20   Jerri Keys, MD  camphor-menthol  [facility]) lotion Apply topically 2 (two) times daily. Patient taking differently: Apply 1 Application topically 2 (two) times daily. 12/03/22   Nicholas Bar, MD  carvedilol  (COREG ) 25 MG tablet Take 1 tablet (25 mg total)  by mouth 2 (two) times daily with a meal. 05/24/22   Jolaine Pac, DO  famotidine  (PEPCID ) 10 MG tablet Take 1 tablet (10 mg total) by mouth daily. 06/13/23   Rosendo Rush, MD  fluticasone  (FLONASE ) 50 MCG/ACT nasal spray Place 1 spray into both nostrils daily. 08/07/23   Kara Dorn NOVAK, MD  gabapentin  (NEURONTIN ) 300 MG capsule Take 1 capsule (300 mg total) by mouth daily. 01/25/23   Lenard Calin, MD  glucose blood test strip Use as instructed For insulin -dependent type 2 diabetes Monitor  glucose 3 times a day Patient taking differently: 1 each by Other route See admin instructions. Use as instructed For insulin -dependent type 2 diabetes Monitor  glucose 3 times a day 01/06/20   Jerri Keys, MD  hydrALAZINE  (APRESOLINE ) 50 MG tablet Take 1 tablet (50 mg total) by  mouth 3 (three) times daily. Patient taking differently: Take 50 mg by mouth 3 (three) times daily. Patient out of medication 04/08/2023, for about a month. 02/19/23 10/18/23  Elnora Ip, MD  hydrOXYzine  (ATARAX ) 25 MG tablet Take 25 mg by mouth daily. 02/08/23   [provider]  ipratropium (ATROVENT ) 0.03 % nasal spray Place 2 sprays into both nostrils every 12 (twelve) hours. 08/07/23   Kara Dorn NOVAK, MD  Iron Sucrose  (VENOFER  IV) Inject 50 mg into the vein See admin instructions. Given on days of Dialysis: Monday, Wednesday, and Friday. 03/08/23 03/06/24  [provider]  levocetirizine (XYZAL ) 5 MG tablet Take 1 tablet (5 mg total) by mouth every evening. 09/25/22   Iva Marty Saltness, MD  lidocaine  (XYLOCAINE ) 2 % jelly Apply 1 Application topically daily. 11/26/22   Dreama Longs, MD  multivitamin (RENA-VIT) TABS tablet Take 1 tablet by mouth at bedtime. 05/24/22   Jolaine Pac, DO  ondansetron  (ZOFRAN -ODT) 4 MG disintegrating tablet Take 4 mg by mouth daily as needed for vomiting or nausea. 04/18/23   [provider]  pantoprazole  (PROTONIX ) 40 MG tablet Take 40 mg by mouth daily.    [provider]  polyethylene glycol powder (GLYCOLAX /MIRALAX ) 17 GM/SCOOP powder Mix as directed and take 1 capful (17 g) by mouth daily. Patient taking differently: Take 17 g by mouth daily as needed for mild constipation. 02/20/23   Elnora Ip, MD  promethazine  (PHENERGAN ) 6.25 MG/5ML solution Take 10 mLs (12.5 mg total) by mouth every 6 (six) hours for 14 days. 06/13/23 10/18/23  Rosendo Rush, MD  revefenacin  (YUPELRI ) 175 MCG/3ML nebulizer solution Take 3 mLs (175 mcg total) by nebulization daily. 08/07/23   Kara Dorn NOVAK, MD  SENSIPAR  60 MG tablet Take 60 mg by mouth daily. 06/20/23   [provider]  sevelamer  carbonate (RENVELA ) 800 MG tablet Take 3 tablets (2,400 mg total) by mouth 3 (three) times daily with meals. 12/03/22    Nicholas Bar, MD  tiZANidine  (ZANAFLEX ) 4 MG tablet Take 4 mg by mouth as needed. 12/11/22   [provider]  traMADol  (ULTRAM ) 50 MG tablet Take 1 tablet (50 mg total) by mouth every 6 (six) hours as needed. 11/27/23 11/26/24  Schuh, McKenzi P, PA-C  Vitamin D , Ergocalciferol , (DRISDOL ) 1.25 MG (50000 UNIT) CAPS capsule Take 50,000 Units by mouth See admin instructions. Take one tablet by mouth on Monday, Wednesdays and Fridays per patient 08/17/19   [provider]  XPHOZAH  30 MG TABS Take 1 tablet by mouth 2 (two) times daily. 01/15/23   [provider]  insulin  aspart protamine  - aspart (NOVOLOG  MIX 70/30 FLEXPEN) (70-30) 100 UNIT/ML FlexPen  Inject 15-30 Units into the skin 2 (two) times daily.  05/24/22  [provider]    Allergies: Hydrocodone  and Oxycodone     Review of Systems  Cardiovascular:  Positive for chest pain.    Updated Vital Signs BP (!) 152/92 (BP Location: Right Arm)   Pulse 81   Temp 97.8 F (36.6 C) (Oral)   Resp 20   Ht 5' 7 (1.702 m)   Wt 101.2 kg   SpO2 96%   BMI 34.93 kg/m   Physical Exam Vitals and nursing note reviewed.  Constitutional:      General: He is not in acute distress.    Appearance: He is well-developed.  HENT:     Head: Normocephalic and atraumatic.  Eyes:     Conjunctiva/sclera: Conjunctivae normal.  Cardiovascular:     Rate and Rhythm: Normal rate and regular rhythm.     Heart sounds: No murmur heard. Pulmonary:     Effort: Pulmonary effort is normal. No respiratory distress.     Breath sounds: Normal breath sounds.  Abdominal:     Palpations: Abdomen is soft.     Tenderness: There is no abdominal tenderness.  Musculoskeletal:     Cervical back: Neck supple.     Comments: Left upper extremity fistula with palpable thrill, no significant swelling, ecchymosis, erythema or warmth.  Radial pulse 2+ tolerates full upper extremity range of motion  Skin:    General: Skin is warm and dry.     Capillary  Refill: Capillary refill takes less than 2 seconds.  Neurological:     Mental Status: He is alert.  Psychiatric:        Mood and Affect: Mood normal.     (all labs ordered are listed, but only abnormal results are displayed) Labs Reviewed  CBC WITH DIFFERENTIAL/PLATELET - Abnormal; Notable for the following components:      Result Value   RBC 3.66 (*)    Hemoglobin 11.4 (*)    HCT 34.9 (*)    All other components within normal limits  COMPREHENSIVE METABOLIC PANEL WITH GFR - Abnormal; Notable for the following components:   Chloride 95 (*)    Glucose, Bld 126 (*)    BUN 33 (*)    Creatinine, Ser 8.81 (*)    Calcium  8.4 (*)    Albumin 3.1 (*)    GFR, Estimated 6 (*)    All other components within normal limits  TROPONIN I (HIGH SENSITIVITY) - Abnormal; Notable for the following components:   Troponin I (High Sensitivity) 46 (*)    All other components within normal limits  TROPONIN I (HIGH SENSITIVITY) - Abnormal; Notable for the following components:   Troponin I (High Sensitivity) 45 (*)    All other components within normal limits    EKG: EKG Interpretation Date/Time:  Wednesday February 12 2024 09:18:28 EST Ventricular Rate:  81 PR Interval:  170 QRS Duration:  82 QT Interval:  416 QTC Calculation: 483 R Axis:   -18  Text Interpretation: Normal sinus rhythm Cannot rule out Anterior infarct , age undetermined Abnormal ECG When compared with ECG of 09-Dec-2023 05:56, PREVIOUS ECG IS PRESENT Confirmed by Neysa Clap [phone]) on 02/12/2024 9:35:42 AM  Radiology: [facility] Chest 2 View Result Date: 02/12/2024 EXAM: 2 VIEW(S) XRAY OF THE CHEST 02/12/2024 09:45:00 AM COMPARISON: 12/09/2023 CLINICAL HISTORY: CP CP FINDINGS: LUNGS AND PLEURA: No focal pulmonary opacity. No pulmonary edema. No pleural effusion. No pneumothorax. HEART AND MEDIASTINUM: No acute abnormality of the cardiac and  mediastinal silhouettes. BONES AND SOFT TISSUES: No acute osseous abnormality. IMPRESSION: 1.  No acute cardiopulmonary disease. Electronically signed by: Lynwood Seip MD 02/12/2024 09:53 AM EST RP Workstation: [id number]     Procedures   Medications Ordered in the ED - No data to display  Clinical Course as of 02/12/24 1239  Wed Feb 12, 2024  0928 Patient evaluated for left upper extremity pain that radiates into his chest that has been on and off for the past couple months.  Underwent fistula revision in August followed by repeat fistulogram and stent in 10/29.  Upon arrival he is hemodynamically stable.  He has palpable thrills at his fistula sites.  He is neurovascularly intact.  No evidence of active infection.  Will begin routine labs. [JT]  1003 CBC with Differential(!) No leukocytosis, hemoglobin stable [JT]  1004 DG Chest 2 View No acute cardiopulmonary disease [JT]  1004 EKG 12-Lead Normal sinus rhythm [JT]  1019 Comprehensive metabolic panel(!) Creatinine of 8.81 in the setting of ESRD, potassium within normal limits [JT]  1019 Troponin I (High Sensitivity)(!) Troponin at baseline of 46, will trend [JT]  1236 Delta troponin at baseline.  Workup is overall reassuring.  Patient will be discharged home. [JT]    Clinical Course User Index [JT] Donnajean Lynwood DEL, PA-C                                 Medical Decision Making Amount and/or Complexity of Data Reviewed Labs: ordered. Decision-making details documented in ED Course. Radiology: ordered. Decision-making details documented in ED Course. ECG/medicine tests:  Decision-making details documented in ED Course.   This patient presents to the ED with chief complaint(s) of arm and chest pain.  The complaint involves an extensive differential diagnosis and also carries with it a high risk of complications and morbidity.   Pertinent past medical history as listed in HPI  The differential diagnosis includes  Considered PE however pain is not exertional or pleuritic.  Has been ongoing for months now and he is not  hypoxic or tachycardic here in the ED.  Considered ACS however patient is without troponin elevation or EKG changes.  Considered fistula complication such as clotting or infection, however, he has a palpable thrill, is neurovascularly intact without any signs or symptoms of active cellulitis. Additional history obtained: Records reviewed Care Everywhere/External Records  Disposition:   Patient will be discharged home. The patient has been appropriately medically screened and/or stabilized in the ED. I have low suspicion for any other emergent medical condition which would require further screening, evaluation or treatment in the ED or require inpatient management. At time of discharge the patient is hemodynamically stable and in no acute distress. I have discussed work-up results and diagnosis with patient and answered all questions. Patient is agreeable with discharge plan. We discussed strict return precautions for returning to the emergency department and they verbalized understanding.     Social Determinants of Health:   none  This note was dictated with voice recognition software.  Despite best efforts at proofreading, errors may have occurred which can change the documentation meaning.       Final diagnoses:  Left arm pain  Atypical chest pain    ED Discharge Orders     None          Donnajean Lynwood DEL, PA-C 02/12/24 1239    Neysa Caron PARAS, DO 02/12/24 [phone]

## 2024-02-12 NOTE — ED Notes (Signed)
 Patient discharged by RN. Patient verbalizes understanding of all instructions without additional questions. Ambulatory to lobby at time of discharge to be picked up by sister.

## 2024-02-14 ENCOUNTER — Telehealth: Payer: Self-pay

## 2024-02-14 NOTE — Telephone Encounter (Signed)
 Patient states that his [facility] was removed 3 weeks ago but is having pain in his arm/shoulder.  Pain is significant enough that he cannot finish full dialysis treatment.  Message sent to scheduling staff for PA visit.

## 2024-02-17 ENCOUNTER — Observation Stay (HOSPITAL_COMMUNITY)

## 2024-02-17 ENCOUNTER — Observation Stay (HOSPITAL_COMMUNITY)
Admission: EM | Admit: 2024-02-17 | Discharge: 2024-02-18 | Disposition: A | Attending: Infectious Diseases | Admitting: Infectious Diseases

## 2024-02-17 ENCOUNTER — Encounter (HOSPITAL_COMMUNITY): Payer: Self-pay | Admitting: *Deleted

## 2024-02-17 ENCOUNTER — Other Ambulatory Visit: Payer: Self-pay

## 2024-02-17 ENCOUNTER — Emergency Department (HOSPITAL_COMMUNITY)

## 2024-02-17 DIAGNOSIS — I5032 Chronic diastolic (congestive) heart failure: Secondary | ICD-10-CM | POA: Insufficient documentation

## 2024-02-17 DIAGNOSIS — I132 Hypertensive heart and chronic kidney disease with heart failure and with stage 5 chronic kidney disease, or end stage renal disease: Secondary | ICD-10-CM | POA: Diagnosis not present

## 2024-02-17 DIAGNOSIS — J441 Chronic obstructive pulmonary disease with (acute) exacerbation: Secondary | ICD-10-CM

## 2024-02-17 DIAGNOSIS — J42 Unspecified chronic bronchitis: Secondary | ICD-10-CM | POA: Insufficient documentation

## 2024-02-17 DIAGNOSIS — M7989 Other specified soft tissue disorders: Secondary | ICD-10-CM

## 2024-02-17 DIAGNOSIS — Z992 Dependence on renal dialysis: Secondary | ICD-10-CM | POA: Insufficient documentation

## 2024-02-17 DIAGNOSIS — E1122 Type 2 diabetes mellitus with diabetic chronic kidney disease: Secondary | ICD-10-CM | POA: Diagnosis not present

## 2024-02-17 DIAGNOSIS — I129 Hypertensive chronic kidney disease with stage 1 through stage 4 chronic kidney disease, or unspecified chronic kidney disease: Secondary | ICD-10-CM

## 2024-02-17 DIAGNOSIS — Y838 Other surgical procedures as the cause of abnormal reaction of the patient, or of later complication, without mention of misadventure at the time of the procedure: Secondary | ICD-10-CM | POA: Insufficient documentation

## 2024-02-17 DIAGNOSIS — I82C11 Acute embolism and thrombosis of right internal jugular vein: Secondary | ICD-10-CM | POA: Diagnosis not present

## 2024-02-17 DIAGNOSIS — J439 Emphysema, unspecified: Secondary | ICD-10-CM | POA: Diagnosis not present

## 2024-02-17 DIAGNOSIS — J4489 Other specified chronic obstructive pulmonary disease: Secondary | ICD-10-CM | POA: Diagnosis not present

## 2024-02-17 DIAGNOSIS — R52 Pain, unspecified: Secondary | ICD-10-CM

## 2024-02-17 DIAGNOSIS — R0602 Shortness of breath: Secondary | ICD-10-CM | POA: Diagnosis present

## 2024-02-17 DIAGNOSIS — D631 Anemia in chronic kidney disease: Secondary | ICD-10-CM | POA: Insufficient documentation

## 2024-02-17 DIAGNOSIS — E8770 Fluid overload, unspecified: Secondary | ICD-10-CM | POA: Diagnosis not present

## 2024-02-17 DIAGNOSIS — I509 Heart failure, unspecified: Secondary | ICD-10-CM | POA: Insufficient documentation

## 2024-02-17 DIAGNOSIS — J849 Interstitial pulmonary disease, unspecified: Secondary | ICD-10-CM | POA: Insufficient documentation

## 2024-02-17 DIAGNOSIS — J45909 Unspecified asthma, uncomplicated: Secondary | ICD-10-CM | POA: Insufficient documentation

## 2024-02-17 DIAGNOSIS — Z6838 Body mass index (BMI) 38.0-38.9, adult: Secondary | ICD-10-CM | POA: Diagnosis not present

## 2024-02-17 DIAGNOSIS — T82590A Other mechanical complication of surgically created arteriovenous fistula, initial encounter: Secondary | ICD-10-CM

## 2024-02-17 DIAGNOSIS — E440 Moderate protein-calorie malnutrition: Secondary | ICD-10-CM

## 2024-02-17 DIAGNOSIS — N186 End stage renal disease: Secondary | ICD-10-CM | POA: Diagnosis not present

## 2024-02-17 LAB — HEMOGLOBIN A1C
Hgb A1c MFr Bld: 7.1 % — ABNORMAL HIGH (ref 4.8–5.6)
Mean Plasma Glucose: 157.07 mg/dL

## 2024-02-17 LAB — COMPREHENSIVE METABOLIC PANEL WITH GFR
ALT: 9 U/L (ref 0–44)
AST: 14 U/L — ABNORMAL LOW (ref 15–41)
Albumin: 2.8 g/dL — ABNORMAL LOW (ref 3.5–5.0)
Alkaline Phosphatase: 113 U/L (ref 38–126)
Anion gap: 18 — ABNORMAL HIGH (ref 5–15)
BUN: 54 mg/dL — ABNORMAL HIGH (ref 6–20)
CO2: 21 mmol/L — ABNORMAL LOW (ref 22–32)
Calcium: 8.1 mg/dL — ABNORMAL LOW (ref 8.9–10.3)
Chloride: 99 mmol/L (ref 98–111)
Creatinine, Ser: 13.43 mg/dL — ABNORMAL HIGH (ref 0.61–1.24)
GFR, Estimated: 4 mL/min — ABNORMAL LOW (ref >60)
Glucose, Bld: 144 mg/dL — ABNORMAL HIGH (ref 70–99)
Potassium: 4 mmol/L (ref 3.5–5.1)
Sodium: 138 mmol/L (ref 135–145)
Total Bilirubin: 0.7 mg/dL (ref 0.0–1.2)
Total Protein: 6.5 g/dL (ref 6.5–8.1)

## 2024-02-17 LAB — CBC WITH DIFFERENTIAL/PLATELET
Abs Immature Granulocytes: 0.04 K/uL (ref 0.00–0.07)
Basophils Absolute: 0 K/uL (ref 0.0–0.1)
Basophils Relative: 0 %
Eosinophils Absolute: 0.1 K/uL (ref 0.0–0.5)
Eosinophils Relative: 1 %
HCT: 32 % — ABNORMAL LOW (ref 39.0–52.0)
Hemoglobin: 10.4 g/dL — ABNORMAL LOW (ref 13.0–17.0)
Immature Granulocytes: 0 %
Lymphocytes Relative: 13 %
Lymphs Abs: 1.2 K/uL (ref 0.7–4.0)
MCH: 31 pg (ref 26.0–34.0)
MCHC: 32.5 g/dL (ref 30.0–36.0)
MCV: 95.5 fL (ref 80.0–100.0)
Monocytes Absolute: 0.6 K/uL (ref 0.1–1.0)
Monocytes Relative: 7 %
Neutro Abs: 7.4 K/uL (ref 1.7–7.7)
Neutrophils Relative %: 79 %
Platelets: 295 K/uL (ref 150–400)
RBC: 3.35 MIL/uL — ABNORMAL LOW (ref 4.22–5.81)
RDW: 13.3 % (ref 11.5–15.5)
WBC: 9.3 K/uL (ref 4.0–10.5)
nRBC: 0 % (ref 0.0–0.2)

## 2024-02-17 LAB — TROPONIN I (HIGH SENSITIVITY)
Troponin I (High Sensitivity): 47 ng/L — ABNORMAL HIGH (ref <18)
Troponin I (High Sensitivity): 34 ng/L — ABNORMAL HIGH (ref <18)

## 2024-02-17 LAB — GLUCOSE, CAPILLARY: Glucose-Capillary: 156 mg/dL — ABNORMAL HIGH (ref 70–99)

## 2024-02-17 LAB — RESP PANEL BY RT-PCR (RSV, FLU A&B, COVID)  RVPGX2
Influenza A by PCR: NEGATIVE
Influenza B by PCR: NEGATIVE
Resp Syncytial Virus by PCR: NEGATIVE
SARS Coronavirus 2 by RT PCR: NEGATIVE

## 2024-02-17 LAB — CBG MONITORING, ED: Glucose-Capillary: 195 mg/dL — ABNORMAL HIGH (ref 70–99)

## 2024-02-17 MED ORDER — APIXABAN 5 MG PO TABS: 10.0000 mg | ORAL_TABLET | Freq: Two times a day (BID) | ORAL | Status: DC

## 2024-02-17 MED ORDER — HEPARIN SODIUM (PORCINE) 1000 UNIT/ML IJ SOLN
INTRAMUSCULAR | Status: AC
  Filled 2024-02-17: qty 4

## 2024-02-17 MED ORDER — APIXABAN 5 MG PO TABS
5.0000 mg | ORAL_TABLET | Freq: Two times a day (BID) | ORAL | Status: DC
Start: 2024-02-24 — End: 2024-03-25
  Filled 2024-02-17: qty 1

## 2024-02-17 MED ORDER — PENTAFLUOROPROP-TETRAFLUOROETH EX AERO: 1.0000 | INHALATION_SPRAY | CUTANEOUS | Status: DC | PRN

## 2024-02-17 MED ORDER — INSULIN ASPART 100 UNIT/ML IJ SOLN
0.0000 [IU] | Freq: Three times a day (TID) | INTRAMUSCULAR | Status: DC
  Administered 2024-02-18: 1 [IU] via SUBCUTANEOUS

## 2024-02-17 MED ORDER — APIXABAN 5 MG PO TABS
10.0000 mg | ORAL_TABLET | Freq: Two times a day (BID) | ORAL | Status: DC
  Administered 2024-02-17 – 2024-02-18 (×2): 10 mg via ORAL
  Filled 2024-02-17 (×3): qty 2

## 2024-02-17 MED ORDER — ACETAMINOPHEN 500 MG PO TABS
1000.0000 mg | ORAL_TABLET | Freq: Three times a day (TID) | ORAL | Status: DC
  Administered 2024-02-17 (×2): 1000 mg via ORAL
  Filled 2024-02-17 (×4): qty 2

## 2024-02-17 MED ORDER — DICLOFENAC SODIUM 1 % EX GEL
4.0000 g | Freq: Four times a day (QID) | CUTANEOUS | Status: DC
  Administered 2024-02-18 (×2): 4 g via TOPICAL
  Filled 2024-02-17 (×2): qty 100

## 2024-02-17 MED ORDER — CHLORHEXIDINE GLUCONATE CLOTH 2 % EX PADS: 6.0000 | MEDICATED_PAD | Freq: Every day | CUTANEOUS | Status: DC

## 2024-02-17 MED ORDER — HEPARIN SODIUM (PORCINE) 5000 UNIT/ML IJ SOLN
5000.0000 [IU] | Freq: Three times a day (TID) | INTRAMUSCULAR | Status: DC
  Administered 2024-02-17: 5000 [IU] via SUBCUTANEOUS
  Filled 2024-02-17: qty 1

## 2024-02-17 MED ORDER — IPRATROPIUM-ALBUTEROL 0.5-2.5 (3) MG/3ML IN SOLN
3.0000 mL | Freq: Once | RESPIRATORY_TRACT | Status: AC
  Administered 2024-02-17: 3 mL via RESPIRATORY_TRACT
  Filled 2024-02-17: qty 3

## 2024-02-17 MED ORDER — HYDRALAZINE HCL 50 MG PO TABS
50.0000 mg | ORAL_TABLET | Freq: Three times a day (TID) | ORAL | Status: DC
  Administered 2024-02-17 – 2024-02-18 (×4): 50 mg via ORAL
  Filled 2024-02-17 (×4): qty 1

## 2024-02-17 MED ORDER — REVEFENACIN 175 MCG/3ML IN SOLN
175.0000 ug | Freq: Every day | RESPIRATORY_TRACT | Status: DC
  Administered 2024-02-17 – 2024-02-18 (×2): 175 ug via RESPIRATORY_TRACT
  Filled 2024-02-17 (×3): qty 3

## 2024-02-17 MED ORDER — MELATONIN 3 MG PO TABS
3.0000 mg | ORAL_TABLET | Freq: Every day | ORAL | Status: DC
  Administered 2024-02-17: 3 mg via ORAL
  Filled 2024-02-17: qty 1

## 2024-02-17 MED ORDER — AMLODIPINE BESYLATE 10 MG PO TABS
10.0000 mg | ORAL_TABLET | Freq: Every day | ORAL | Status: DC
  Administered 2024-02-17 – 2024-02-18 (×2): 10 mg via ORAL
  Filled 2024-02-17: qty 1
  Filled 2024-02-17: qty 2

## 2024-02-17 MED ORDER — ARFORMOTEROL TARTRATE 15 MCG/2ML IN NEBU
15.0000 ug | INHALATION_SOLUTION | Freq: Two times a day (BID) | RESPIRATORY_TRACT | Status: DC
  Administered 2024-02-17 – 2024-02-18 (×3): 15 ug via RESPIRATORY_TRACT
  Filled 2024-02-17 (×4): qty 2

## 2024-02-17 MED ORDER — ACETAMINOPHEN 650 MG RE SUPP: 650.0000 mg | Freq: Three times a day (TID) | RECTAL | Status: DC

## 2024-02-17 MED ORDER — PANTOPRAZOLE SODIUM 40 MG PO TBEC
40.0000 mg | DELAYED_RELEASE_TABLET | Freq: Every day | ORAL | Status: DC
  Administered 2024-02-17 – 2024-02-18 (×2): 40 mg via ORAL
  Filled 2024-02-17 (×2): qty 1

## 2024-02-17 MED ORDER — HYDROMORPHONE HCL 2 MG PO TABS
1.0000 mg | ORAL_TABLET | Freq: Four times a day (QID) | ORAL | Status: DC | PRN
  Administered 2024-02-18: 1 mg via ORAL
  Filled 2024-02-17: qty 1

## 2024-02-17 MED ORDER — APIXABAN 5 MG PO TABS: 5.0000 mg | ORAL_TABLET | Freq: Two times a day (BID) | ORAL | Status: DC

## 2024-02-17 MED ORDER — BUDESONIDE 0.5 MG/2ML IN SUSP
0.5000 mg | Freq: Two times a day (BID) | RESPIRATORY_TRACT | Status: DC
Start: 2024-02-17 — End: 2024-02-18
  Administered 2024-02-17 – 2024-02-18 (×3): 0.5 mg via RESPIRATORY_TRACT
  Filled 2024-02-17 (×4): qty 2

## 2024-02-17 MED ORDER — HYDROMORPHONE HCL 1 MG/ML IJ SOLN
1.0000 mg | INTRAMUSCULAR | Status: DC | PRN
  Administered 2024-02-17: 1 mg via INTRAVENOUS
  Filled 2024-02-17: qty 1

## 2024-02-17 MED ORDER — CARVEDILOL 25 MG PO TABS
25.0000 mg | ORAL_TABLET | Freq: Two times a day (BID) | ORAL | Status: DC
  Administered 2024-02-18 (×2): 25 mg via ORAL
  Filled 2024-02-17 (×2): qty 1

## 2024-02-17 MED ORDER — SENNOSIDES-DOCUSATE SODIUM 8.6-50 MG PO TABS
1.0000 | ORAL_TABLET | Freq: Every day | ORAL | Status: DC
  Administered 2024-02-17: 1 via ORAL
  Filled 2024-02-17: qty 1

## 2024-02-17 MED ORDER — INSULIN ASPART 100 UNIT/ML IJ SOLN: 0.0000 [IU] | Freq: Every day | INTRAMUSCULAR | Status: DC

## 2024-02-17 MED ORDER — HEPARIN SODIUM (PORCINE) 1000 UNIT/ML DIALYSIS
4000.0000 [IU] | Freq: Once | INTRAMUSCULAR | Status: AC
  Administered 2024-02-17: 4000 [IU] via INTRAVENOUS_CENTRAL
  Filled 2024-02-17: qty 4

## 2024-02-17 NOTE — Procedures (Signed)
 S: pt seen in HD unit, Edie O2, getting fluid off. No trouble sticking the AVF.   Vitals:   02/17/24 1602 02/17/24 1609 02/17/24 1613 02/17/24 1639  BP: (!) 167/90 (!) 166/88 (!) 163/87 (!) 176/98  Pulse: 83 85 82 83  Resp: 14 (!) 22 20 15   Temp: 97.9 F (36.6 C)     TempSrc:      SpO2: 99% 97% 96% 98%  Weight: 114.3 kg       Recent Labs  Lab 02/12/24 0915 02/17/24 0730  HGB 11.4* 10.4*  ALBUMIN 3.1* 2.8*  CALCIUM  8.4* 8.1*  CREATININE 8.81* 13.43*  K 4.5 4.0    Inpatient medications:  acetaminophen   1,000 mg Oral Q8H   Or   acetaminophen   650 mg Rectal Q8H   amLODipine   10 mg Oral Daily   apixaban   10 mg Oral BID   And   [START ON 02/24/2024] apixaban   5 mg Oral BID   arformoterol   15 mcg Nebulization BID   budesonide   0.5 mg Nebulization BID   carvedilol   25 mg Oral BID WC   Chlorhexidine  Gluconate Cloth  6 each Topical Q0600   diclofenac Sodium  4 g Topical QID   hydrALAZINE   50 mg Oral TID   insulin  aspart  0-5 Units Subcutaneous QHS   insulin  aspart  0-6 Units Subcutaneous TID WC   melatonin  3 mg Oral QHS   pantoprazole   40 mg Oral Daily   revefenacin   175 mcg Nebulization Daily   senna-docusate  1 tablet Oral QHS    HYDROmorphone  (DILAUDID ) injection, HYDROmorphone , pentafluoroprop-tetrafluoroeth  I was present at the procedure, reviewed the HD regimen and made appropriate changes.   Myer Fret MD  [facility] 02/17/2024, 5:00 PM

## 2024-02-17 NOTE — ED Notes (Signed)
 Called lab regarding respiratory panel sent down at the same time as the rest of the labs, but not yet in process. Per micro, starting test now.

## 2024-02-17 NOTE — Progress Notes (Signed)
 Received patient in bed to unit.  Alert and oriented.  Informed consent signed and in chart.   TX duration: 3.5  Patient tolerated well.  Transported back to the room  Alert, without acute distress.  Hand-off given to patient's nurse. Shift RN  Access used: AVF Access issues: None  Total UF removed: 4000 Medication(s) given: None Post HD VS: [id number]-RR18 B/P166/92 Post HD weight: 110.3kg  Neville Seip, RN [facility]   02/17/24 2000  Vitals  Temp 98.8 F (37.1 C)  Temp Source Oral  BP (!) 166/92  BP Location Right Arm  BP Method Automatic  Patient Position (if appropriate) Lying  Pulse Rate 84  Pulse Rate Source Monitor  ECG Heart Rate 84  Resp 18  Weight 110.3 kg  Type of Weight Post-Dialysis  Oxygen Therapy  SpO2 100 %  O2 Device Nasal Cannula  O2 Flow Rate (L/min) 2 L/min  Patient Activity (if Appropriate) In bed  Pulse Oximetry Type Continuous  During Treatment Monitoring  Blood Flow Rate (mL/min) 0 mL/min  Arterial Pressure (mmHg) -39.59 mmHg  Venous Pressure (mmHg) 98.18 mmHg  TMP (mmHg) 21.21 mmHg  Ultrafiltration Rate (mL/min) 1302 mL/min  Dialysate Flow Rate (mL/min) 300 ml/min  Dialysate Potassium Concentration 3  Dialysate Calcium  Concentration 2.5  Duration of HD Treatment -hour(s) 3.5 hour(s)  Cumulative Fluid Removed (mL) per Treatment  4000.21  HD Safety Checks Performed Yes  Intra-Hemodialysis Comments Tx completed;Tolerated well  Post Treatment  Dialyzer Clearance Clear  Liters Processed 84  Fluid Removed (mL) 4000 mL  Tolerated HD Treatment Yes  AVG/AVF Arterial Site Held (minutes) 10 minutes  AVG/AVF Venous Site Held (minutes) 10 minutes  Fistula / Graft Left Upper arm Arteriovenous fistula  Placement Date/Time: 04/09/23 1150   Placed prior to admission: Yes  Orientation: Left  Access Location: Upper arm  Access Type: Arteriovenous fistula  Site Condition No complications  Fistula / Graft Assessment Present;Thrill;Bruit   Status Deaccessed;Flushed;Patent  Needle Size 15  Drainage Description None

## 2024-02-17 NOTE — ED Notes (Signed)
 Pt and all belongings transported to HD at this time

## 2024-02-17 NOTE — ED Provider Notes (Signed)
 [place] EMERGENCY DEPARTMENT AT [facility] Provider Note   CSN: [id number] Arrival date & time: 02/17/24  9297     Patient presents with: Shortness of Breath   [name] is a 58 y.o. male.   Patient here with shortness of breath with some bad the last several days.  He has history of dialysis went to his dialysis session Friday was supposed to have it today.  He has been having short sessions of dialysis due to discomfort at his fistula site it sounds like.  Got breathing treatments and steroids with EMS.  Does have COPD history.  He is 100% on room air.  Blood sugar normal.  Mildly hypertensive.  He has a chronic cough.  He has not noticed any change in his cough.  He has a history of hypertension diabetes heart failure emphysema.  On hemodialysis.  The history is provided by the patient.       Prior to Admission medications   Medication Sig Start Date End Date Taking? Authorizing Provider  Accu-Chek Softclix Lancets lancets Use as directed.  For insulin -dependent type 2 diabetes Monitor  glucose 3 times a day Patient taking differently: 1 each by Other route See admin instructions. Use as directed.  For insulin -dependent type 2 diabetes Monitor  glucose 3 times a day 01/06/20   Jerri Keys, MD  acetaminophen  (TYLENOL ) 325 MG tablet Take 2 tablets (650 mg total) by mouth every 6 (six) hours as needed for mild pain (pain score 1-3) (or Fever >/= 101). 06/13/23   Rosendo Rush, MD  albuterol  (PROVENTIL ) (2.5 MG/3ML) 0.083% nebulizer solution Take 3 mLs (2.5 mg total) by nebulization every 6 (six) hours as needed for wheezing or shortness of breath. 06/13/23   Rosendo Rush, MD  albuterol  (VENTOLIN  HFA) 108 [phone] Base) MCG/ACT inhaler Inhale 2 puffs into the lungs every 6 (six) hours as needed for wheezing or shortness of breath. 09/25/22   Iva Marty Saltness, MD  amLODipine  (NORVASC ) 10 MG tablet Take 1 tablet (10 mg total) by mouth daily. 06/13/23   Rosendo Rush, MD   arformoterol  (BROVANA ) 15 MCG/2ML NEBU Take 2 mLs (15 mcg total) by nebulization 2 (two) times daily. 08/07/23   Kara Dorn NOVAK, MD  benzonatate  (TESSALON ) 100 MG capsule Take 200 mg by mouth 3 (three) times daily as needed for cough. 04/18/23   [provider]  budesonide  (PULMICORT ) 0.5 MG/2ML nebulizer solution Take 2 mLs (0.5 mg total) by nebulization 2 (two) times daily. 08/07/23   Kara Dorn NOVAK, MD  busPIRone  (BUSPAR ) 5 MG tablet Take 5 mg by mouth 2 (two) times daily. 02/05/23   [provider]  calcitRIOL  (ROCALTROL ) 0.25 MCG capsule Take 1 capsule (0.25 mcg total) by mouth every Monday, Wednesday, and Friday. 01/08/20   Jerri Keys, MD  camphor-menthol  [facility]) lotion Apply topically 2 (two) times daily. Patient taking differently: Apply 1 Application topically 2 (two) times daily. 12/03/22   Nicholas Bar, MD  carvedilol  (COREG ) 25 MG tablet Take 1 tablet (25 mg total) by mouth 2 (two) times daily with a meal. 05/24/22   Jolaine Pac, DO  famotidine  (PEPCID ) 10 MG tablet Take 1 tablet (10 mg total) by mouth daily. 06/13/23   Rosendo Rush, MD  fluticasone  (FLONASE ) 50 MCG/ACT nasal spray Place 1 spray into both nostrils daily. 08/07/23   Kara Dorn NOVAK, MD  gabapentin  (NEURONTIN ) 300 MG capsule Take 1 capsule (300 mg total) by mouth daily. 01/25/23   Lenard Calin, MD  glucose  blood test strip Use as instructed For insulin -dependent type 2 diabetes Monitor  glucose 3 times a day Patient taking differently: 1 each by Other route See admin instructions. Use as instructed For insulin -dependent type 2 diabetes Monitor  glucose 3 times a day 01/06/20   Jerri Keys, MD  hydrALAZINE  (APRESOLINE ) 50 MG tablet Take 1 tablet (50 mg total) by mouth 3 (three) times daily. Patient taking differently: Take 50 mg by mouth 3 (three) times daily. Patient out of medication 04/08/2023, for about a month. 02/19/23 10/18/23  Elnora Ip, MD  hydrOXYzine  (ATARAX ) 25 MG tablet  Take 25 mg by mouth daily. 02/08/23   [provider]  ipratropium (ATROVENT ) 0.03 % nasal spray Place 2 sprays into both nostrils every 12 (twelve) hours. 08/07/23   Kara Dorn NOVAK, MD  Iron Sucrose  (VENOFER  IV) Inject 50 mg into the vein See admin instructions. Given on days of Dialysis: Monday, Wednesday, and Friday. 03/08/23 03/06/24  [provider]  levocetirizine (XYZAL ) 5 MG tablet Take 1 tablet (5 mg total) by mouth every evening. 09/25/22   Iva Marty Saltness, MD  lidocaine  (XYLOCAINE ) 2 % jelly Apply 1 Application topically daily. 11/26/22   Dreama Longs, MD  multivitamin (RENA-VIT) TABS tablet Take 1 tablet by mouth at bedtime. 05/24/22   Jolaine Pac, DO  ondansetron  (ZOFRAN -ODT) 4 MG disintegrating tablet Take 4 mg by mouth daily as needed for vomiting or nausea. 04/18/23   [provider]  pantoprazole  (PROTONIX ) 40 MG tablet Take 40 mg by mouth daily.    [provider]  polyethylene glycol powder (GLYCOLAX /MIRALAX ) 17 GM/SCOOP powder Mix as directed and take 1 capful (17 g) by mouth daily. Patient taking differently: Take 17 g by mouth daily as needed for mild constipation. 02/20/23   Elnora Ip, MD  promethazine  (PHENERGAN ) 6.25 MG/5ML solution Take 10 mLs (12.5 mg total) by mouth every 6 (six) hours for 14 days. 06/13/23 10/18/23  Rosendo Rush, MD  revefenacin  (YUPELRI ) 175 MCG/3ML nebulizer solution Take 3 mLs (175 mcg total) by nebulization daily. 08/07/23   Kara Dorn NOVAK, MD  SENSIPAR  60 MG tablet Take 60 mg by mouth daily. 06/20/23   [provider]  sevelamer  carbonate (RENVELA ) 800 MG tablet Take 3 tablets (2,400 mg total) by mouth 3 (three) times daily with meals. 12/03/22   Nicholas Bar, MD  tiZANidine  (ZANAFLEX ) 4 MG tablet Take 4 mg by mouth as needed. 12/11/22   [provider]  traMADol  (ULTRAM ) 50 MG tablet Take 1 tablet (50 mg total) by mouth every 6 (six) hours as needed. 11/27/23 11/26/24  Schuh,  McKenzi P, PA-C  Vitamin D , Ergocalciferol , (DRISDOL ) 1.25 MG (50000 UNIT) CAPS capsule Take 50,000 Units by mouth See admin instructions. Take one tablet by mouth on Monday, Wednesdays and Fridays per patient 08/17/19   [provider]  XPHOZAH  30 MG TABS Take 1 tablet by mouth 2 (two) times daily. 01/15/23   [provider]  insulin  aspart protamine  - aspart (NOVOLOG  MIX 70/30 FLEXPEN) (70-30) 100 UNIT/ML FlexPen Inject 15-30 Units into the skin 2 (two) times daily.  05/24/22  [provider]    Allergies: Hydrocodone  and Oxycodone     Review of Systems  Updated Vital Signs BP (!) 175/90   Pulse 98   Temp 99.7 F (37.6 C) (Oral)   Resp (!) 39   SpO2 94%   Physical Exam Vitals and nursing note reviewed.  Constitutional:      General: He is not in acute distress.  Appearance: He is well-developed. He is not ill-appearing.  HENT:     Head: Normocephalic and atraumatic.     Mouth/Throat:     Mouth: Mucous membranes are moist.  Eyes:     Conjunctiva/sclera: Conjunctivae normal.     Pupils: Pupils are equal, round, and reactive to light.  Cardiovascular:     Rate and Rhythm: Normal rate and regular rhythm.     Heart sounds: No murmur heard. Pulmonary:     Effort: No respiratory distress.     Breath sounds: Decreased breath sounds present.     Comments: Mildly increased work of breathing, coarse breath sounds throughout Abdominal:     Palpations: Abdomen is soft.     Tenderness: There is no abdominal tenderness.  Musculoskeletal:        General: No swelling. Normal range of motion.     Cervical back: Normal range of motion and neck supple.     Right lower leg: No edema.     Left lower leg: No edema.  Skin:    General: Skin is warm and dry.     Capillary Refill: Capillary refill takes less than 2 seconds.  Neurological:     Mental Status: He is alert.  Psychiatric:        Mood and Affect: Mood normal.     (all labs ordered are listed, but only  abnormal results are displayed) Labs Reviewed  CBC WITH DIFFERENTIAL/PLATELET - Abnormal; Notable for the following components:      Result Value   RBC 3.35 (*)    Hemoglobin 10.4 (*)    HCT 32.0 (*)    All other components within normal limits  COMPREHENSIVE METABOLIC PANEL WITH GFR - Abnormal; Notable for the following components:   CO2 21 (*)    BUN 54 (*)    Creatinine, Ser 13.43 (*)    Calcium  8.1 (*)    Albumin 2.8 (*)    AST 14 (*)    GFR, Estimated 4 (*)    Anion gap 18 (*)    All other components within normal limits  CBG MONITORING, ED - Abnormal; Notable for the following components:   Glucose-Capillary 195 (*)    All other components within normal limits  TROPONIN I (HIGH SENSITIVITY) - Abnormal; Notable for the following components:   Troponin I (High Sensitivity) 47 (*)    All other components within normal limits  RESP PANEL BY RT-PCR (RSV, FLU A&B, COVID)  [id number]  TROPONIN I (HIGH SENSITIVITY)    EKG: EKG Interpretation Date/Time:  Monday February 17 2024 08:56:50 EST Ventricular Rate:  99 PR Interval:  178 QRS Duration:  95 QT Interval:  379 QTC Calculation: 487 R Axis:   -11  Text Interpretation: Sinus rhythm Probable left atrial enlargement Borderline prolonged QT interval Confirmed by Ruthe Cornet [phone]) on 02/17/2024 9:04:20 AM  Radiology: [facility] Chest Portable 1 View Result Date: 02/17/2024 CLINICAL DATA:  Shortness of breath EXAM: PORTABLE CHEST 1 VIEW COMPARISON:  Chest x-ray performed February 12, 2024 FINDINGS: Mildly enlarged heart. Prominence of the central pulmonary vasculature. Mild hypoventilatory changes. No discrete lobar infiltrate and no significant pleural effusion. IMPRESSION: 1. Mildly enlarged heart with mild central congestion. Electronically Signed   By: Maude Naegeli M.D.   On: 02/17/2024 07:25     Procedures   Medications Ordered in the ED  ipratropium-albuterol  (DUONEB) 0.5-2.5 (3) MG/3ML nebulizer solution 3 mL (3 mLs  Nebulization Given 02/17/24 0743)  Medical Decision Making Amount and/or Complexity of Data Reviewed Labs: ordered. Radiology: ordered.  Risk Prescription drug management. Decision regarding hospitalization.   [name] is here with shortness of breath.  Patient with low-grade temp of 99.7.  Blood pressure is 179/90.  He is 92% on room air.  Mildly increased work of breathing.  Coarse breath sounds throughout.  History of end-stage renal disease on hemodialysis history of COPD, CAD diabetes.  Differential diagnosis could be volume overload in the setting of somewhat incomplete dialysis here recently.  Could also be COPD exacerbation.  Could be multifactorial.  Will give additional breathing treatment.  He is already gotten breathing treatments and steroids with EMS.  Seems less likely to be ACS or infectious process but will get a chest x-ray COVID flu RSV test CBC CMP troponin.  Per my review and interpretation of labs troponin 47.  Creatinine 13 blood sugars unremarkable.  He is not in DKA.  However chest x-ray does look like there is some volume overload.  COVID flu RSV test negative.  Overall talked with Dr. Susannah with nephrology who will set up for dialysis but I do think he might need multiple sessions might need steroids as well to help with may be competing COPD exacerbation.  Will admit to hospitalist for further care.  This chart was dictated using voice recognition software.  Despite best efforts to proofread,  errors can occur which can change the documentation meaning.      Final diagnoses:  SOB (shortness of breath)  Hypervolemia, unspecified hypervolemia type  COPD exacerbation [facility])    ED Discharge Orders     None          Ruthe Cornet, DO 02/17/24 1033

## 2024-02-17 NOTE — ED Notes (Signed)
 Pt verbalizes understanding of pain medication risks and benefits. Pt agrees with fall precuations

## 2024-02-17 NOTE — ED Notes (Signed)
 Pt transported to vascular with transport at this time. Visator at bedside

## 2024-02-17 NOTE — ED Notes (Signed)
 Placed on 2l for comfort
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
## 2024-02-17 NOTE — ED Notes (Signed)
 Portable Xray at bedside.

## 2024-02-17 NOTE — ED Notes (Signed)
 Pt returned to room 46

## 2024-02-17 NOTE — ED Notes (Signed)
 This RN called to room, pt c/o worsening CP and now radiating to L shoulder. Appears uncomfortable. Repeat EKG obtained, handed to EDP. Awaiting new orders.

## 2024-02-17 NOTE — Procedures (Signed)
 HD Note:  Some information was entered later than the data was gathered due to patient care needs. The stated time with the data is accurate.  Received patient in bed to unit.   Alert and oriented.   Informed consent signed and in chart.   Access used: upper left arm fistula Access issues: cannulation went well, experienced Dialysis Tech performed this.   Hand-off given to oncoming dialysis nurse    Berwyn L. Lenon, RN [facility].
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
## 2024-02-17 NOTE — Consult Note (Addendum)
 Hospital Consult    Reason for Consult:  pain L arm AVF Requesting Physician:  ED MRN #:  [id number]  History of Present Illness: This is a 58 y.o. male admitted with shortness of breath related to COPD exacerbation and/or missed dialysis sessions.  He is being seen in consultation for evaluation of pain at the left arm fistula at rest and during treatment.  Left arm AV fistula was created in 2021.  He has had 2 revisions involving plication over the years.  Most recently he underwent fistula plication with Dr. Serene in August of this year.  2 weeks ago he underwent cephalic arch angioplasty and stenting to improve venous outflow.  He has had trouble with the dialysis techs being able to cannulate the fistula.  He also reports increasing pain over the course of his treatment to the point where he asks to be disconnected before the treatment has been completed.  He has no other dialysis access.  Past Medical History:  Diagnosis Date   Adjustment disorder with mixed anxiety and depressed mood 12/28/2014   Asthma    Bacteremia due to Pseudomonas 12/01/2022   CHF (congestive heart failure) (HCC)    CPAP (continuous positive airway pressure) dependence    Diabetes mellitus without complication (HCC)    DM (diabetes mellitus) type II controlled with renal manifestation (HCC) 12/28/2014   DM (diabetes mellitus) type II controlled, neurological manifestation (HCC) 12/28/2014   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HCC)    Encephalopathy, hypertensive 12/28/2014   ESRD on hemodialysis (HCC)    MWF at SW / [facility]   GERD (gastroesophageal reflux disease)    Hypertension    Hypertensive emergency without congestive heart failure 12/27/2014   Pleural effusion    Pneumonia    Possible Panic disorder 12/28/2014   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   Sleep apnea     Past Surgical History:  Procedure Laterality Date   A/V FISTULAGRAM N/A 05/23/2023   Procedure: A/V Fistulagram;  Surgeon: Norine Manuelita LABOR, MD;   Location: [facility];  Service: Cardiovascular;  Laterality: N/A;   A/V FISTULAGRAM Left 02/05/2024   Procedure: A/V Fistulagram;  Surgeon: Pearline Norman RAMAN, MD;  Location: [facility];  Service: Cardiovascular;  Laterality: Left;   A/V SHUNT INTERVENTION Left 11/14/2023   Procedure: A/V SHUNT INTERVENTION;  Surgeon: Serene Gaile ORN, MD;  Location: [facility];  Service: Cardiovascular;  Laterality: Left;   AV FISTULA PLACEMENT Left 01/05/2020   Procedure: LEFT ARM BRACHIOCEPHALIC ARTERIOVENOUS (AV) FISTULA;  Surgeon: Eliza Lonni RAMAN, MD;  Location: [facility] OR;  Service: Vascular;  Laterality: Left;   EMPYEMA DRAINAGE Right 05/17/2017   Procedure: EMPYEMA DRAINAGE AND DECORTICATION;  Surgeon: Army Dallas NOVAK, MD;  Location: [facility] OR;  Service: Thoracic;  Laterality: Right;   FLEXIBLE BRONCHOSCOPY  05/17/2017   Procedure: FLEXIBLE BRONCHOSCOPY;  Surgeon: Army Dallas NOVAK, MD;  Location: [facility] OR;  Service: Thoracic;;   INSERTION OF DIALYSIS CATHETER Right 12/12/2021   Procedure: INSERTION OF RIGHT INTERNAL JUGULAR DIALYSIS CATHETER;  Surgeon: Eliza Lonni RAMAN, MD;  Location: [facility] OR;  Service: Vascular;  Laterality: Right;   INSERTION OF DIALYSIS CATHETER N/A 11/27/2023   Procedure: INSERTION OF DIALYSIS CATHETER;  Surgeon: Serene Gaile ORN, MD;  Location: [facility] OR;  Service: Vascular;  Laterality: N/A;   IR FLUORO GUIDE CV LINE RIGHT  01/01/2020   IR REMOVAL TUN CV CATH W/O FL  01/14/2024   IR THORACENTESIS ASP PLEURAL SPACE W/IMG GUIDE  05/17/2017   IR US  GUIDE  VASC ACCESS RIGHT  01/01/2020   PERIPHERAL VASCULAR BALLOON ANGIOPLASTY  05/23/2023   Procedure: PERIPHERAL VASCULAR BALLOON ANGIOPLASTY;  Surgeon: Norine Manuelita LABOR, MD;  [facility]: [facility];  Service: Cardiovascular;;   REVISON OF ARTERIOVENOUS FISTULA Left 12/12/2021   Procedure: REVISION OF LEFT ARM FISTULA BY PLICATION;  Surgeon: Eliza Lonni RAMAN, MD;  Location: [facility] OR;  Service: Vascular;  Laterality: Left;    REVISON OF ARTERIOVENOUS FISTULA Left 11/27/2023   Procedure: Aneurysm plication of left arm;  Surgeon: Serene Gaile ORN, MD;  Location: [facility] OR;  Service: Vascular;  Laterality: Left;   THORACOTOMY/LOBECTOMY Right 05/17/2017   Procedure: RIGHT MINI THORACOTOMY;  Surgeon: Army Dallas NOVAK, MD;  Location: [facility] OR;  Service: Thoracic;  Laterality: Right;   VENOUS ANGIOPLASTY  11/14/2023   Procedure: VENOUS ANGIOPLASTY;  Surgeon: Serene Gaile ORN, MD;  Location: [facility];  Service: Cardiovascular;;   VENOUS ANGIOPLASTY  02/05/2024   Procedure: VENOUS ANGIOPLASTY;  Surgeon: Pearline Norman RAMAN, MD;  Location: [facility];  Service: Cardiovascular;;   VENOUS STENT  02/05/2024   Procedure: VENOUS STENT;  Surgeon: Pearline Norman RAMAN, MD;  Location: [facility];  Service: Cardiovascular;;  85%cephalic arch   VIDEO ASSISTED THORACOSCOPY (VATS)/EMPYEMA Right 05/17/2017   Procedure: RIGHT VIDEO ASSISTED THORACOSCOPY (VATS)/EMPYEMA;  Surgeon: Army Dallas NOVAK, MD;  Location: [facility] OR;  Service: Thoracic;  Laterality: Right;    Allergies  Allergen Reactions   Hydrocodone  Itching   Oxycodone  Itching    Prior to Admission medications   Medication Sig Start Date End Date Taking? Authorizing Provider  acetaminophen  (TYLENOL ) 325 MG tablet Take 2 tablets (650 mg total) by mouth every 6 (six) hours as needed for mild pain (pain score 1-3) (or Fever >/= 101). 06/13/23   Rosendo Rush, MD  albuterol  (PROVENTIL ) (2.5 MG/3ML) 0.083% nebulizer solution Take 3 mLs (2.5 mg total) by nebulization every 6 (six) hours as needed for wheezing or shortness of breath. 06/13/23   Rosendo Rush, MD  albuterol  (VENTOLIN  HFA) [phone] Base) MCG/ACT inhaler Inhale 2 puffs into the lungs every 6 (six) hours as needed for wheezing or shortness of breath. 09/25/22   Iva Marty Saltness, MD  amLODipine  (NORVASC ) 10 MG tablet Take 1 tablet (10 mg total) by mouth daily. 06/13/23   Rosendo Rush, MD  arformoterol  (BROVANA ) 15 MCG/2ML NEBU Take 2 mLs  (15 mcg total) by nebulization 2 (two) times daily. 08/07/23   Kara Dorn NOVAK, MD  AURYXIA 1 GM 210 MG(Fe) tablet Take 210 mg by mouth. 12/11/23   [provider]  benzonatate  (TESSALON ) 100 MG capsule Take 200 mg by mouth 3 (three) times daily as needed for cough. 04/18/23   [provider]  budesonide  (PULMICORT ) 0.5 MG/2ML nebulizer solution Take 2 mLs (0.5 mg total) by nebulization 2 (two) times daily. 08/07/23   Kara Dorn NOVAK, MD  busPIRone  (BUSPAR ) 5 MG tablet Take 5 mg by mouth 2 (two) times daily. 02/05/23   [provider]  calcitRIOL  (ROCALTROL ) 0.25 MCG capsule Take 1 capsule (0.25 mcg total) by mouth every Monday, Wednesday, and Friday. 01/08/20   Jerri Keys, MD  calcitRIOL  (ROCALTROL ) 0.5 MCG capsule Take 0.5 mcg by mouth daily.    [provider]  camphor-menthol  VIKKI) lotion Apply topically 2 (two) times daily. Patient taking differently: Apply 1 Application topically 2 (two) times daily. 12/03/22   Nicholas Bar, MD  carvedilol  (COREG ) 25 MG tablet Take 1 tablet (25 mg total) by mouth 2 (two) times  daily with a meal. 05/24/22   Jolaine Pac, DO  famotidine  (PEPCID ) 10 MG tablet Take 1 tablet (10 mg total) by mouth daily. 06/13/23   Rosendo Rush, MD  fluticasone  (FLONASE ) 50 MCG/ACT nasal spray Place 1 spray into both nostrils daily. 08/07/23   Kara Dorn NOVAK, MD  gabapentin  (NEURONTIN ) 300 MG capsule Take 1 capsule (300 mg total) by mouth daily. 01/25/23   Lenard Calin, MD  hydrALAZINE  (APRESOLINE ) 50 MG tablet Take 1 tablet (50 mg total) by mouth 3 (three) times daily. Patient taking differently: Take 50 mg by mouth 3 (three) times daily. Patient out of medication 04/08/2023, for about a month. 02/19/23 10/18/23  Elnora Ip, MD  hydrOXYzine  (ATARAX ) 25 MG tablet Take 25 mg by mouth daily. 02/08/23   [provider]  ipratropium (ATROVENT ) 0.03 % nasal spray Place 2 sprays into both nostrils every 12 (twelve) hours.  08/07/23   Kara Dorn NOVAK, MD  Iron Sucrose  (VENOFER  IV) Inject 50 mg into the vein See admin instructions. Given on days of Dialysis: Monday, Wednesday, and Friday. 03/08/23 03/06/24  [provider]  levocetirizine (XYZAL ) 5 MG tablet Take 1 tablet (5 mg total) by mouth every evening. 09/25/22   Iva Marty Saltness, MD  lidocaine  (XYLOCAINE ) 2 % jelly Apply 1 Application topically daily. 11/26/22   Dreama Longs, MD  multivitamin (RENA-VIT) TABS tablet Take 1 tablet by mouth at bedtime. 05/24/22   Jolaine Pac, DO  ondansetron  (ZOFRAN -ODT) 4 MG disintegrating tablet Take 4 mg by mouth daily as needed for vomiting or nausea. 04/18/23   [provider]  pantoprazole  (PROTONIX ) 40 MG tablet Take 40 mg by mouth daily.    [provider]  polyethylene glycol powder (GLYCOLAX /MIRALAX ) 17 GM/SCOOP powder Mix as directed and take 1 capful (17 g) by mouth daily. Patient taking differently: Take 17 g by mouth daily as needed for mild constipation. 02/20/23   Elnora Ip, MD  promethazine  (PHENERGAN ) 6.25 MG/5ML solution Take 10 mLs (12.5 mg total) by mouth every 6 (six) hours for 14 days. 06/13/23 10/18/23  Rosendo Rush, MD  revefenacin  (YUPELRI ) 175 MCG/3ML nebulizer solution Take 3 mLs (175 mcg total) by nebulization daily. 08/07/23   Kara Dorn NOVAK, MD  SENSIPAR  60 MG tablet Take 60 mg by mouth daily. 06/20/23   [provider]  sevelamer  carbonate (RENVELA ) 800 MG tablet Take 3 tablets (2,400 mg total) by mouth 3 (three) times daily with meals. 12/03/22   Nicholas Bar, MD  tiZANidine  (ZANAFLEX ) 4 MG tablet Take 4 mg by mouth as needed. 12/11/22   [provider]  traMADol  (ULTRAM ) 50 MG tablet Take 1 tablet (50 mg total) by mouth every 6 (six) hours as needed. 11/27/23 11/26/24  Schuh, McKenzi P, PA-C  VITAMIN D  PO Take 1 tablet by mouth daily.    [provider]  insulin  aspart protamine  - aspart (NOVOLOG  MIX 70/30 FLEXPEN) (70-30) 100  UNIT/ML FlexPen Inject 15-30 Units into the skin 2 (two) times daily.  05/24/22  [provider]    Social History   Socioeconomic History   Marital status: Married    Spouse name: Not on file   Number of children: Not on file   Years of education: Not on file   Highest education level: Not on file  Occupational History   Not on file  Tobacco Use   Smoking status: Never    Passive exposure: Never   Smokeless tobacco: Never  Vaping Use   Vaping status: Never Used  Substance and Sexual Activity   Alcohol use: No   Drug use: No   Sexual activity: Not on file  Other Topics Concern   Not on file  Social History Narrative   Not on file   Social Drivers of Health   Financial Resource Strain: Not on file  Food Insecurity: No Food Insecurity (06/10/2023)   Hunger Vital Sign    Worried About Running Out of Food in the Last Year: Never true    Ran Out of Food in the Last Year: Never true  Transportation Needs: No Transportation Needs (06/10/2023)   PRAPARE - Administrator, Civil Service (Medical): No    Lack of Transportation (Non-Medical): No  Physical Activity: Not on file  Stress: Not on file  Social Connections: Unknown (08/22/2021)   Received from [facility]   Social Network    Social Network: Not on file  Intimate Partner Violence: Not At Risk (06/10/2023)   Humiliation, Afraid, Rape, and Kick questionnaire    Fear of Current or Ex-Partner: No    Emotionally Abused: No    Physically Abused: No    Sexually Abused: No    Family History  Problem Relation Age of Onset   Hypertension Mother    Lung cancer Father     ROS: Otherwise negative unless mentioned in HPI  Physical Examination  Vitals:   02/17/24 1345 02/17/24 1400  BP: (!) 165/82   Pulse: 87 91  Resp: (!) 26 16  Temp:    SpO2: 97%    There is no height or weight on file to calculate BMI.  General:  WDWN in NAD Gait: Not observed HENT: WNL, normocephalic Pulmonary: normal  non-labored breathing Cardiac: regular Abdomen:  soft, NT/ND, no masses Skin: without rashes Vascular Exam/Pulses: Palpable left radial pulse; easily palpable thrill throughout the upper arm fistula and into the shoulder Extremities: without ischemic changes, without Gangrene , without cellulitis; without open wounds; no areas of overlying wounds left arm Musculoskeletal: no muscle wasting or atrophy  Neurologic: A&O X 3;  No focal weakness or paresthesias are detected; speech is fluent/normal Psychiatric:  The pt has Normal affect. Lymph:  Unremarkable  CBC    Component Value Date/Time   WBC 9.3 02/17/2024 0730   RBC 3.35 (L) 02/17/2024 0730   HGB 10.4 (L) 02/17/2024 0730   HGB 11.1 (L) 09/25/2022 1056   HCT 32.0 (L) 02/17/2024 0730   HCT 34.2 (L) 09/25/2022 1056   PLT 295 02/17/2024 0730   MCV 95.5 02/17/2024 0730   MCV 94 09/25/2022 1056   MCH 31.0 02/17/2024 0730   MCHC 32.5 02/17/2024 0730   RDW 13.3 02/17/2024 0730   RDW 13.2 09/25/2022 1056   LYMPHSABS 1.2 02/17/2024 0730   LYMPHSABS 1.8 09/25/2022 1056   MONOABS 0.6 02/17/2024 0730   EOSABS 0.1 02/17/2024 0730   EOSABS 0.2 09/25/2022 1056   BASOSABS 0.0 02/17/2024 0730   BASOSABS 0.0 09/25/2022 1056    BMET    Component Value Date/Time   NA 138 02/17/2024 0730   K 4.0 02/17/2024 0730   CL 99 02/17/2024 0730   CO2 21 (L) 02/17/2024 0730   GLUCOSE 144 (H) 02/17/2024 0730   BUN 54 (H) 02/17/2024 0730   CREATININE 13.43 (H) 02/17/2024 0730   CALCIUM  8.1 (L) 02/17/2024 0730   GFRNONAA 4 (L) 02/17/2024 0730   GFRAA 13 (L) 01/07/2020 0130    COAGS: Lab Results  Component Value Date   INR 1.14 05/16/2017  INR 1.12 12/27/2014     Non-Invasive Vascular Imaging:   Fistula duplex pending   ASSESSMENT/PLAN: This is a 58 y.o. male being seen in consultation for evaluation of left arm AV fistula   Mr. Mazariego is a 58 year old male with a left arm AV fistula.  He has had 2 surgical revisions over the past  several years involving plication of aneurysmal degeneration.  Most recently he underwent cephalic outflow angioplasty and stenting during fistulogram.  He reports pain during cannulation of the fistula and increasing pain as the treatment progresses.  The pain has been severe enough to make him terminate the treatment prior to completion.  On exam he has an easily palpable thrill throughout the AV fistula on the left arm.  He also has a palpable left radial pulse at the wrist.  Fistula duplex is pending.  Based on physical exam there is nothing to offer surgically to improve fistula.  Would recommend trying dialysis again while inpatient to see if he is able to tolerate.  Last resort would be ligation of left arm AV fistula, placement of TDC, and creation of new access in the right arm however would not consider this unless we have exhausted all options trying to perform dialysis on the current, functioning fistula.  On-call vascular surgeon Dr. Silver was also involved in the evaluation and management plan of this patient today.   Donnice Sender PA-C [facility] [phone]   VASCULAR STAFF ADDENDUM: I have independently interviewed and examined the patient. I agree with the above.  I am unsure as to why he is having pain in the left arm.  During our discussion, he claims that 2 of the techs can easily accessed the graft, however some struggle at dialysis.  He also notes that when the alarm starts, it makes him very anxious,, and asks to stop.  He thinks his graft would work fine if he could go to dialysis on a daily basis and only undergo 2 hours of dialysis.  I do not think that this is a flow issue within the graft.  No signs of flow issues on recent ultrasound.  I had a long conversation with Reggie regarding the above.  Options are continued fistula use versus ligation, tunneled dialysis catheter and fistula creation in the right arm.  I do not think this is a good idea as he  is a young guy.  He should continue to use his dialysis as long as possible.  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  Regardless, recommend 3 months anticoagulation.    Fonda FORBES Rim MD [facility] Phone Number: [phone] 02/17/2024 3:06 PM

## 2024-02-17 NOTE — ED Triage Notes (Addendum)
 Pt arrives from home by [facility] due to increasing sob x1 week.  Pt has HD MWF which he has been attending but he has not been able to get a full HD for some time due to difficulties with his fistula.  Pt has abdominal distention which ems describes as rigid and painful to touch.  Pt had some breathing treatments (5 albuterol  and 1 atrovent ) and 125mg  solumedrol due to diminished breath sounds from ems.  Pt was 100% on RA with CBG 139 and BP 189/84 with RR 26. 20g in RAC. Pt also reports headache since today.

## 2024-02-18 DIAGNOSIS — I129 Hypertensive chronic kidney disease with stage 1 through stage 4 chronic kidney disease, or unspecified chronic kidney disease: Secondary | ICD-10-CM | POA: Diagnosis not present

## 2024-02-18 DIAGNOSIS — N186 End stage renal disease: Secondary | ICD-10-CM | POA: Diagnosis not present

## 2024-02-18 DIAGNOSIS — E8770 Fluid overload, unspecified: Secondary | ICD-10-CM | POA: Diagnosis not present

## 2024-02-18 DIAGNOSIS — R0602 Shortness of breath: Principal | ICD-10-CM

## 2024-02-18 DIAGNOSIS — Z992 Dependence on renal dialysis: Secondary | ICD-10-CM | POA: Diagnosis not present

## 2024-02-18 LAB — CBC
HCT: 33.6 % — ABNORMAL LOW (ref 39.0–52.0)
Hemoglobin: 11.2 g/dL — ABNORMAL LOW (ref 13.0–17.0)
MCH: 30.9 pg (ref 26.0–34.0)
MCHC: 33.3 g/dL (ref 30.0–36.0)
MCV: 92.8 fL (ref 80.0–100.0)
Platelets: 332 K/uL (ref 150–400)
RBC: 3.62 MIL/uL — ABNORMAL LOW (ref 4.22–5.81)
RDW: 13.4 % (ref 11.5–15.5)
WBC: 7.9 K/uL (ref 4.0–10.5)
nRBC: 0 % (ref 0.0–0.2)

## 2024-02-18 LAB — GLUCOSE, CAPILLARY
Glucose-Capillary: 164 mg/dL — ABNORMAL HIGH (ref 70–99)
Glucose-Capillary: 122 mg/dL — ABNORMAL HIGH (ref 70–99)

## 2024-02-18 LAB — RESPIRATORY PANEL BY PCR

## 2024-02-18 LAB — BASIC METABOLIC PANEL WITH GFR
Anion gap: 17 — ABNORMAL HIGH (ref 5–15)
BUN: 40 mg/dL — ABNORMAL HIGH (ref 6–20)
CO2: 25 mmol/L (ref 22–32)
Calcium: 9.3 mg/dL (ref 8.9–10.3)
Chloride: 92 mmol/L — ABNORMAL LOW (ref 98–111)
Creatinine, Ser: 10.19 mg/dL — ABNORMAL HIGH (ref 0.61–1.24)
GFR, Estimated: 5 mL/min — ABNORMAL LOW (ref >60)
Glucose, Bld: 197 mg/dL — ABNORMAL HIGH (ref 70–99)
Potassium: 4.3 mmol/L (ref 3.5–5.1)
Sodium: 134 mmol/L — ABNORMAL LOW (ref 135–145)

## 2024-02-18 LAB — HEPATITIS B SURFACE ANTIGEN: Hepatitis B Surface Ag: NONREACTIVE

## 2024-02-18 MED ORDER — HEPARIN SODIUM (PORCINE) 1000 UNIT/ML DIALYSIS
3000.0000 [IU] | Freq: Once | INTRAMUSCULAR | Status: DC
  Filled 2024-02-18: qty 3

## 2024-02-18 MED ORDER — NEPRO/CARBSTEADY PO LIQD: 237.0000 mL | ORAL | 0 refills | Status: AC | PRN

## 2024-02-18 MED ORDER — PNEUMOCOCCAL 20-VAL CONJ VACC 0.5 ML IM SUSY: 0.5000 mL | PREFILLED_SYRINGE | INTRAMUSCULAR | Status: DC

## 2024-02-18 MED ORDER — HEPARIN SODIUM (PORCINE) 1000 UNIT/ML IJ SOLN
INTRAMUSCULAR | Status: AC
  Filled 2024-02-18: qty 5

## 2024-02-18 MED ORDER — LIDOCAINE-PRILOCAINE 2.5-2.5 % EX CREA
1.0000 | TOPICAL_CREAM | CUTANEOUS | Status: DC | PRN
Start: 2024-02-18 — End: 2024-02-18

## 2024-02-18 MED ORDER — HEPARIN SODIUM (PORCINE) 1000 UNIT/ML DIALYSIS: 1500.0000 [IU] | INTRAMUSCULAR | Status: DC | PRN

## 2024-02-18 MED ORDER — APIXABAN 5 MG PO TABS: ORAL_TABLET | ORAL | 0 refills | Status: DC

## 2024-02-18 MED ORDER — LIDOCAINE HCL (PF) 1 % IJ SOLN: 5.0000 mL | INTRAMUSCULAR | Status: DC | PRN

## 2024-02-18 MED ORDER — DICLOFENAC SODIUM 1 % EX GEL: 4.0000 g | Freq: Four times a day (QID) | CUTANEOUS | 0 refills | Status: AC

## 2024-02-18 MED ORDER — HEPARIN SODIUM (PORCINE) 1000 UNIT/ML DIALYSIS
1000.0000 [IU] | INTRAMUSCULAR | Status: DC | PRN
Start: 2024-02-18 — End: 2024-02-18

## 2024-02-18 MED ORDER — ANTICOAGULANT SODIUM CITRATE 4% (200MG/5ML) IV SOLN: 5.0000 mL | Status: DC | PRN

## 2024-02-18 MED ORDER — NEPRO/CARBSTEADY PO LIQD: 237.0000 mL | ORAL | Status: DC | PRN

## 2024-02-18 MED ORDER — ALTEPLASE 2 MG IJ SOLR: 2.0000 mg | Freq: Once | INTRAMUSCULAR | Status: DC | PRN

## 2024-02-18 MED ORDER — CHLORHEXIDINE GLUCONATE CLOTH 2 % EX PADS: 6.0000 | MEDICATED_PAD | Freq: Every day | CUTANEOUS | Status: DC

## 2024-02-18 MED ORDER — PENTAFLUOROPROP-TETRAFLUOROETH EX AERO: 1.0000 | INHALATION_SPRAY | CUTANEOUS | Status: DC | PRN

## 2024-02-18 NOTE — Progress Notes (Signed)
 Used fistula without issue.  Vascular will sign off at this time

## 2024-02-18 NOTE — Discharge Planning (Signed)
 [facility] Patient Discharge Orders - [facility] CLINIC: [facility]  Patient's name: [name] Admit/DC Dates: 02/17/2024 - 02/18/24  DISCHARGE DIAGNOSES: AHRF/pulm edema - better with back to back HD     HD ORDER CHANGES: Heparin  change: no EDW Change: no Bath Change: no  ANEMIA MANAGEMENT: Aranesp : Given: no   ESA dose for discharge: mircera per protocol IV Iron dose at discharge: per protocol Transfusion: Given: no  BONE/MINERAL MEDICATIONS: Hectorol /Calcitriol  change: no Sensipar /Parsabiv change: no  ACCESS INTERVENTION/CHANGE: no Details:  RECENT LABS: Recent Labs  Lab 02/17/24 0730 02/18/24 0430  HGB 10.4* 11.2*  NA 138 134*  K 4.0 4.3  CALCIUM  8.1* 9.3  ALBUMIN 2.8*  --    IV ANTIBIOTICS: no Details:  OTHER ANTICOAGULATION: no Details:  OTHER/APPTS/LAB ORDERS:  - Should be back for usual HD tomorrow - Hydralazine  on hold due to low-ish BP here  D/C Meds to be reconciled by nurse after every discharge.  Completed By: Izetta Boehringer, PA-C [facility] Pager [phone]   Reviewed by: MD:______ RN_______

## 2024-02-18 NOTE — Care Management Obs Status (Signed)
 MEDICARE OBSERVATION STATUS NOTIFICATION   Patient Details  Name: [name] MRN: [id number] Date of Birth: [birth year]   Medicare Observation Status Notification Given:  Yes  Obs notice signed and copy given.   Twyla Dais 02/18/2024, 12:03 PM

## 2024-02-18 NOTE — Progress Notes (Addendum)
 Pt receives out-pt HD at [facility] [place] on MWF 5:25 am chair time. Will assist as needed.   Randine Mungo Dialysis Navigator [phone]  Addendum at 4:39 pm: Advised by case manager of plans for pt to d/c to home today after HD. Contacted [facility] [place] to be advised that pt is for likely d/c later today and should resume care tomorrow.

## 2024-02-18 NOTE — Progress Notes (Signed)
 OT Cancellation Note  Patient Details Name: [name] Nam MRN: [id number] DOB: May 19, 1965   Cancelled Treatment:    Reason Eval/Treat Not Completed: (P) Patient at procedure or test/ unavailable, Pt at HD, will return as time allows  [name] 02/18/2024, 2:03 PM

## 2024-02-18 NOTE — Progress Notes (Signed)
  Received patient in bed to unit.   Informed consent signed and in chart.    TX duration:     Transported by  Hand-off given to patient's nurse.    Access used: Rt AVF Access issues: none   Total UF removed: 5000 Medication(s) given: None Post HD VS: 114/76 Post HD weight: 106.3   Hunter Hacking LPN [facility]

## 2024-02-18 NOTE — Progress Notes (Signed)
 [facility] Progress Note   Subjective:   Seen in room - dyspnea improved with 4L off on HD yesterday - remains up per weights. Coming up for HD for another 5L off soon.  Objective Vitals:   02/18/24 0848 02/18/24 0858 02/18/24 1100 02/18/24 1225  BP: (!) 159/87   136/80  Pulse: 78   70  Resp: 19   (!) 22  Temp: 97.8 F (36.6 C)   97.9 F (36.6 C)  TempSrc: Oral   Oral  SpO2: 99% 98%  100%  Weight:   109 kg 111.1 kg  Height:       Physical Exam General: Well appearing man, NAD. [place] O2 Heart: RRR Lungs: CTA anteriorly Abdomen: soft Extremities: no LE edema Dialysis Access: LUE AVF +t/b  Additional Objective Labs: Basic Metabolic Panel: Recent Labs  Lab 02/12/24 0915 02/17/24 0730 02/18/24 0430  NA 135 138 134*  K 4.5 4.0 4.3  CL 95* 99 92*  CO2 28 21* 25  GLUCOSE 126* 144* 197*  BUN 33* 54* 40*  CREATININE 8.81* 13.43* 10.19*  CALCIUM  8.4* 8.1* 9.3   Liver Function Tests: Recent Labs  Lab 02/12/24 0915 02/17/24 0730  AST 18 14*  ALT 12 9  ALKPHOS 119 113  BILITOT 0.8 0.7  PROT 6.9 6.5  ALBUMIN 3.1* 2.8*   CBC: Recent Labs  Lab 02/12/24 0915 02/17/24 0730 02/18/24 0430  WBC 7.8 9.3 7.9  NEUTROABS 5.1 7.4  --   HGB 11.4* 10.4* 11.2*  HCT 34.9* 32.0* 33.6*  MCV 95.4 95.5 92.8  PLT 306 295 332   Studies/Results: VAS US  UPPER EXTREMITY VENOUS DUPLEX Result Date: 02/18/2024 UPPER VENOUS STUDY  Patient Name:  [name]  Date of Exam:   02/17/2024 Medical Rec #: [id number]        Accession #:    [id number] Date of Birth: 16-Feb-1966         Patient Gender: M Patient Age:   58 years Exam Location:  [facility] Procedure:      VAS US  UPPER EXTREMITY VENOUS DUPLEX Referring Phys: JEFFREY HATCHER --------------------------------------------------------------------------------  Indications: H/O of cephalic vein stenosis s/p stent placement and dialysis access pain., SOB, Pain, and Swelling Comparison Study: No prior exam. Performing  Technologist: Edilia Elden Appl  Examination Guidelines: A complete evaluation includes B-mode imaging, spectral Doppler, color Doppler, and power Doppler as needed of all accessible portions of each vessel. Bilateral testing is considered an integral part of a complete examination. Limited examinations for reoccurring indications may be performed as noted.  Right Findings: +----------+------------+---------+-----------+----------+-------+ RIGHT     CompressiblePhasicitySpontaneousPropertiesSummary +----------+------------+---------+-----------+----------+-------+ IJV         Partial      Yes       Yes                      +----------+------------+---------+-----------+----------+-------+ Subclavian    Full       Yes       Yes                      +----------+------------+---------+-----------+----------+-------+ Axillary      Full       Yes       Yes                      +----------+------------+---------+-----------+----------+-------+ Brachial      Full       Yes       Yes                      +----------+------------+---------+-----------+----------+-------+  Radial        Full                                          +----------+------------+---------+-----------+----------+-------+ Ulnar         Full                                          +----------+------------+---------+-----------+----------+-------+ Cephalic      Full                                          +----------+------------+---------+-----------+----------+-------+ Basilic       Full       Yes       Yes                      +----------+------------+---------+-----------+----------+-------+ Deep vein thrombosis noted in the right internal jugular vein. Incidental finding, therefore a bilateral upper extremity venous was performed.  Left Findings: +----------+------------+---------+-----------+----------+-------+ LEFT      CompressiblePhasicitySpontaneousPropertiesSummary  +----------+------------+---------+-----------+----------+-------+ IJV           Full       Yes       Yes                      +----------+------------+---------+-----------+----------+-------+ Subclavian    Full       Yes       Yes                      +----------+------------+---------+-----------+----------+-------+ Axillary      Full       Yes       Yes                      +----------+------------+---------+-----------+----------+-------+ Brachial      Full       Yes       Yes                      +----------+------------+---------+-----------+----------+-------+ Radial        Full                                          +----------+------------+---------+-----------+----------+-------+ Ulnar         Full                                          +----------+------------+---------+-----------+----------+-------+ Basilic       Full       Yes       Yes                      +----------+------------+---------+-----------+----------+-------+ Left brachiocephalic dialysis access, supraclavicular and proximal upper arm stents are patent.  Summary:  Right: No evidence of superficial vein thrombosis in the upper extremity. Findings consistent with acute deep vein thrombosis involving the right internal jugular vein.  Left: No evidence of deep vein thrombosis in the  upper extremity. No evidence of superficial vein thrombosis in the upper extremity. Dialysis access and stents are patent.  *See table(s) above for measurements and observations.  Diagnosing physician: Lonni Gaskins MD Electronically signed by Lonni Gaskins MD on 02/18/2024 at 7:55:51 AM.    Final    US  LT UPPER EXTREM LTD SOFT TISSUE NON VASCULAR Result Date: 02/17/2024 CLINICAL DATA:  Left upper extremity pain. EXAM: ULTRASOUND left UPPER EXTREMITY LIMITED TECHNIQUE: Ultrasound examination of the upper extremity soft tissues was performed in the area of clinical concern. COMPARISON:  None  Available. FINDINGS: Targeted sonographic images of the soft tissues of the left upper extremity performed. There is diffuse subcutaneous edema. Clinical correlation recommended to evaluate for cellulitis. No fluid collection. A dilated vessel with calcified atherosclerotic plaque only partially visualized and not fully evaluated. This may represent an arterialized venous segment of an AV fistula. Clinical correlation recommended. Dedicated vascular ultrasound with flow interrogation may provide better evaluation if clinically indicated. IMPRESSION: 1. Subcutaneous edema may represent cellulitis. 2. Dilated vessel as above. Electronically Signed   By: Vanetta Chou M.D.   On: 02/17/2024 14:36   DG Chest Portable 1 View Result Date: 02/17/2024 CLINICAL DATA:  Shortness of breath EXAM: PORTABLE CHEST 1 VIEW COMPARISON:  Chest x-ray performed February 12, 2024 FINDINGS: Mildly enlarged heart. Prominence of the central pulmonary vasculature. Mild hypoventilatory changes. No discrete lobar infiltrate and no significant pleural effusion. IMPRESSION: 1. Mildly enlarged heart with mild central congestion. Electronically Signed   By: Maude Naegeli M.D.   On: 02/17/2024 07:25   Medications:  anticoagulant sodium citrate       acetaminophen   1,000 mg Oral Q8H   Or   acetaminophen   650 mg Rectal Q8H   amLODipine   10 mg Oral Daily   apixaban   10 mg Oral BID   And   [START ON 02/24/2024] apixaban   5 mg Oral BID   arformoterol   15 mcg Nebulization BID   budesonide   0.5 mg Nebulization BID   carvedilol   25 mg Oral BID WC   Chlorhexidine  Gluconate Cloth  6 each Topical Q0600   diclofenac Sodium  4 g Topical QID   heparin   3,000 Units Dialysis Once in dialysis   hydrALAZINE   50 mg Oral TID   insulin  aspart  0-5 Units Subcutaneous QHS   insulin  aspart  0-6 Units Subcutaneous TID WC   melatonin  3 mg Oral QHS   pantoprazole   40 mg Oral Daily   [START ON 02/19/2024] pneumococcal 20-valent conjugate vaccine   0.5 mL Intramuscular Tomorrow-1000   revefenacin   175 mcg Nebulization Daily   senna-docusate  1 tablet Oral QHS    Dialysis Orders MWF - AF 4:15hr, 400/A1.5, EDW 103kg, 2K/2Ca bath, AVF, heparin  4000 unit bolus - ESA on hold - Hectoral 7mcg IV q HD  Assessment/Plan: AHRF/pulm edema: S/p HD 11/10 with 4L off, plan another HD today for further volume. Likely ok for discharge after. ESRD: Usual MWF schedule - for extra HD today, then back to usual. 5L UFG today. HTN/volume: BP ok, remains up on weights, UF as tolerated. Fluids disc. 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 Secondary HPTH: Ca ok, Phos pending - continue home meds. 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   Izetta Boehringer, PA-C 02/18/2024, 12:48 PM  [employer]

## 2024-02-18 NOTE — Discharge Instructions (Addendum)
 Thank you for allowing us  to be part of your care. You were hospitalized for shortness of breath. We treated you with dialysis.  See the changes in your medications and management of your chronic conditions below:  *For your Renal Disease -We recommend that you continue with your dialysis treatments and with your regular follow up with nephrology. -We have made no medications adjustments for your renal disease. -Follow up with the vascular surgeon  *For your blood clot in your Right Internal Jugular Vein - This was an incidental finding. - We recommend 3 months of medication that helps to breakdown and prevent the growth of the clot.  - Eliquis  10mg  twice a day for 7 days total (final dose of 10mg  would be 11/16)  - After finishing with the 10mg , starting on 11/17, Eliquis  5mg  twice a day: you will need to see your PCP for a refill as we sent in a 30 day supply   FOLLOW UP APPOINTMENTS: Please reach out to your primary care provider to follow up within the next 7-10 days  Please call your PCP or our clinic if you have any questions or concerns, we may be able to help and keep you from a long and expensive emergency room wait. Our clinic and after hours phone number is [phone]. The best time to call is Monday through Friday 9 am to 4 pm but there is always someone available 24/7 if you have an emergency. If you need medication refills please notify your pharmacy one week in advance and they will send us  a request.   We are glad you are feeling better,  Penne Mori; DO Internal Medicine Inpatient Teaching Service at [facility]

## 2024-02-18 NOTE — Care Management Obs Status (Signed)
 MEDICARE OBSERVATION STATUS NOTIFICATION   Patient Details  Name: [name] MRN: [id number] Date of Birth: 1965/11/08   Medicare Observation Status Notification Given:  Yes  Obs notice signed and copy given.   Natayla Cadenhead 02/18/2024, 12:06 PM

## 2024-02-18 NOTE — Hospital Course (Addendum)
 Volume overload ESRD on HD MWF L arm pain with the AVF access site Pt came to the emergency department with shortness of breath and was found to be volume overloaded. He had recently (Friday) not been able to tolerate a full dialysis session because of HD access site pain. It was suspected that shortened dialysis and subsequent fluid overload was the reason for his shortness of breath, and dialysis was provided. He tolerated dialysis well on Monday and Tuesday without difficulties, he will need to follow up with [facility]VS outpatient.   R internal jugular DVT Because of the above listed concern for HD site, vascular surgery was consulted, and VAS doppler was performed, which was not concerning for his arm, but did find a R internal jugular DVT which was susected to be from prior catheter sites. Pt was monitored for pain and congestion during the rest of his stay. There were no concerns with flow for the HD access site, and access was obtained easily.  Eliquis  was started with the loading dose 10 mg BID for 7 days followed by eliquis  5 mg BID for a total of 3 months per VVS.    #Chronic Health Conditions The following chronic health conditions were managed with minimal change from home regimen to no acute concern: HTN, bronchitis, ILD

## 2024-02-18 NOTE — Progress Notes (Signed)
 Patient discharged to home. PIV removed. AVS reviewed with patient. Patient verbalized understanding. Patient belongings returned to patient. Patient satisfied with care revived by staff. Patient transported home by mother.

## 2024-02-18 NOTE — Plan of Care (Signed)
   Problem: Education: Goal: Ability to describe self-care measures that may prevent or decrease complications (Diabetes Survival Skills Education) will improve Outcome: Completed/Met Goal: Individualized Educational Video(s) Outcome: Completed/Met   Problem: Coping: Goal: Ability to adjust to condition or change in health will improve Outcome: Completed/Met   Problem: Fluid Volume: Goal: Ability to maintain a balanced intake and output will improve Outcome: Completed/Met   Problem: Health Behavior/Discharge Planning: Goal: Ability to identify and utilize available resources and services will improve Outcome: Completed/Met Goal: Ability to manage health-related needs will improve Outcome: Completed/Met   Problem: Metabolic: Goal: Ability to maintain appropriate glucose levels will improve Outcome: Completed/Met   Problem: Nutritional: Goal: Maintenance of adequate nutrition will improve Outcome: Completed/Met Goal: Progress toward achieving an optimal weight will improve Outcome: Completed/Met   Problem: Skin Integrity: Goal: Risk for impaired skin integrity will decrease Outcome: Completed/Met   Problem: Tissue Perfusion: Goal: Adequacy of tissue perfusion will improve Outcome: Completed/Met

## 2024-02-18 NOTE — Progress Notes (Signed)
 PT Cancellation Note  Patient Details Name: Nakai Pollio Nuttle MRN: [id number] DOB: [birth year]   Cancelled Treatment:    Reason Eval/Treat Not Completed: Patient at procedure or test/unavailable  Currently in HD;  Will follow up later today as time allows;  Otherwise, will follow up for PT tomorrow;   Thank you,  Silvano Currier, PT  Acute Rehabilitation Services Office [phone]    Silvano VEAR Currier 02/18/2024, 1:57 PM

## 2024-02-18 NOTE — TOC Initial Note (Signed)
 Transition of Care [facility]) - Initial/Assessment Note    Patient Details  Name: [name] MRN: [id number] Date of Birth: November 11, 1965  Transition of Care [facility]) CM/SW Contact:    Lendia Dais, LCSWA Phone Number: 02/18/2024, 12:49 PM  Clinical Narrative: Pt is from home w/ spouse. Pt reports no DME, and independent with with feeding, walking, dressing, and driving. Pt reported that they need assistance with bathing  from behind d/t their HD cath. Pt stated that his wife is able to provide assistance.  Pt has no hx of HH and takes meds a prescribed.   Pt reports no SDOH concerns and received SSI as income. Pt has no HCPOA and declined info. Pt has seen PCP Deneda Day in the last year. Pt has no hx of MH/SU.                   No current TOC/ICM needs. Please place consult for further needs.  Expected Discharge Plan: Home/Self Care     Patient Goals and CMS Choice Patient states their goals for this hospitalization and ongoing recovery are:: Getting better          Expected Discharge Plan and Services In-house Referral: Clinical Social Work     Living arrangements for the past 2 months: Single Family Home                                      Prior Living Arrangements/Services Living arrangements for the past 2 months: Single Family Home Lives with:: Spouse Patient language and need for interpreter reviewed:: Yes Do you feel safe going back to the place where you live?: Yes      Need for Family Participation in Patient Care: Yes (Comment) Care giver support system in place?: Yes (comment)   Criminal Activity/Legal Involvement Pertinent to Current Situation/Hospitalization: No - Comment as needed  Activities of Daily Living   ADL Screening (condition at time of admission) Independently performs ADLs?: No Does the patient have a NEW difficulty with bathing/dressing/toileting/self-feeding that is expected to last >3 days?: Yes (Initiates electronic notice to  provider for possible OT consult) Does the patient have a NEW difficulty with getting in/out of bed, walking, or climbing stairs that is expected to last >3 days?: No Does the patient have a NEW difficulty with communication that is expected to last >3 days?: No Is the patient deaf or have difficulty hearing?: No Does the patient have difficulty seeing, even when wearing glasses/contacts?: No Does the patient have difficulty concentrating, remembering, or making decisions?: No  Permission Sought/Granted Permission sought to share information with : Family Supports Permission granted to share information with : Yes, Verbal Permission Granted  Share Information with NAME: Cloyd Louder     Permission granted to share info w Relationship: Spouse  Permission granted to share info w Contact Information: [phone]  Emotional Assessment Appearance:: Appears stated age Attitude/Demeanor/Rapport: Engaged Affect (typically observed): Appropriate, Pleasant Orientation: : Oriented to Self, Oriented to Situation, Oriented to Place, Oriented to  Time Alcohol / Substance Use: Not Applicable Psych Involvement: No (comment)  Admission diagnosis:  SOB (shortness of breath) [R06.02] COPD exacerbation (HCC) [J44.1] Volume overload [E87.70] Hypervolemia, unspecified hypervolemia type [E87.70] Patient Active Problem List   Diagnosis Date Noted   Volume overload 02/17/2024   Dialysis AV fistula malfunction 02/17/2024   Moderate protein-calorie malnutrition 02/17/2024   Severe hypertension 06/11/2023   Anemia in  chronic kidney disease (CKD) 04/09/2023   Prolonged QT interval 12/02/2022   Leg wound, left 11/30/2022   Acute hypoxic respiratory failure (HCC) 05/22/2022   Chronic heart failure with preserved ejection fraction (HCC) 05/22/2022   Tinea pedis of both feet 11/22/2020   Cardiomegaly 02/02/2020   Chronic ulcer of right leg (HCC) 10/25/2019   AF (paroxysmal atrial fibrillation) (HCC)  10/25/2019   Bilateral lower extremity edema 10/06/2019   Eustachian tube dysfunction, right 10/06/2019   Chronic anticoagulation 12/11/2018   Myocardial injury 11/02/2018   Diabetic polyneuropathy associated with type 2 diabetes mellitus (HCC) 11/13/2017   ESRD on dialysis (HCC) 07/28/2017   Microalbuminuria 01/20/2015   DMII (diabetes mellitus, type 2) (HCC) 12/28/2014   Recurrent major depressive disorder, in partial remission 12/28/2014   Anxiety 12/23/2014   Class 2 obesity 05/14/2012   Sleep apnea 05/14/2012   Class 2 severe obesity due to excess calories with serious comorbidity and body mass index (BMI) of 38.0 to 38.9 in adult 05/14/2012   Severe uncontrolled hypertension 03/28/2011   PCP:  Rik Glinda DASEN, FNP Pharmacy:   [facility] [address], [place] - [address] [place] [address] Phone: [phone] Fax: [phone]     Social Drivers of Health (SDOH) Social History: SDOH Screenings   Food Insecurity: No Food Insecurity (02/17/2024)  Housing: Low Risk  (02/17/2024)  Transportation Needs: No Transportation Needs (02/17/2024)  Utilities: Not At Risk (02/17/2024)  Social Connections: Unknown (08/22/2021)   Received from [facility]  Tobacco Use: Low Risk  (02/17/2024)   SDOH Interventions:     Readmission Risk Interventions    06/13/2023    2:49 PM 01/24/2023    3:01 PM  Readmission Risk Prevention Plan  Transportation Screening Complete Complete  PCP or Specialist Appt within 3-5 Days  Complete  HRI or Home Care Consult  Complete  Palliative Care Screening  Not Applicable  Medication Review (RN Care Manager) Complete Complete  PCP or Specialist appointment within 3-5 days of discharge Complete   HRI or Home Care Consult Complete   SW Recovery Care/Counseling Consult Complete   Palliative Care Screening Complete   Skilled Nursing Facility Not Applicable

## 2024-02-18 NOTE — Discharge Summary (Addendum)
 Name: [name] MRN: [id number] DOB: 1965/11/05 58 y.o. PCP: Daye, Deneda T, FNP  Date of Admission: 02/17/2024  7:02 AM Date of Discharge: 02/18/2024 Attending Physician: Dr. Reyes Fenton  Discharge Diagnosis: 1. Principal Problem:   Volume overload Active Problems:   ESRD on dialysis [facility])   Dialysis AV fistula malfunction   Moderate protein-calorie malnutrition    Discharge Medications: Allergies as of 02/18/2024       Reactions   Hydrocodone  Itching   Oxycodone  Itching        Medication List     PAUSE taking these medications    hydrALAZINE  50 MG tablet Wait to take this until your doctor or other care provider tells you to start again. Commonly known as: APRESOLINE  Take 1 tablet (50 mg total) by mouth 3 (three) times daily. What changed: additional instructions       STOP taking these medications    Auryxia 1 GM 210 MG(Fe) tablet Generic drug: ferric citrate   benzonatate  100 MG capsule Commonly known as: TESSALON    busPIRone  5 MG tablet Commonly known as: BUSPAR    calcitRIOL  0.25 MCG capsule Commonly known as: ROCALTROL    calcitRIOL  0.5 MCG capsule Commonly known as: ROCALTROL    famotidine  10 MG tablet Commonly known as: PEPCID    fluticasone  50 MCG/ACT nasal spray Commonly known as: FLONASE    gabapentin  300 MG capsule Commonly known as: NEURONTIN    hydrOXYzine  25 MG tablet Commonly known as: ATARAX    levocetirizine 5 MG tablet Commonly known as: XYZAL    lidocaine  2 % jelly Commonly known as: XYLOCAINE    ondansetron  4 MG disintegrating tablet Commonly known as: ZOFRAN -ODT   promethazine  6.25 MG/5ML solution Commonly known as: PHENERGAN    Sensipar  60 MG tablet Generic drug: cinacalcet    sevelamer  carbonate 800 MG tablet Commonly known as: RENVELA    tiZANidine  4 MG tablet Commonly known as: ZANAFLEX    traMADol  50 MG tablet Commonly known as: Ultram    VENOFER  IV       TAKE these medications     acetaminophen  325 MG tablet Commonly known as: TYLENOL  Take 2 tablets (650 mg total) by mouth every 6 (six) hours as needed for mild pain (pain score 1-3) (or Fever >/= 101).   albuterol  108 (90 Base) MCG/ACT inhaler Commonly known as: Ventolin  HFA Inhale 2 puffs into the lungs every 6 (six) hours as needed for wheezing or shortness of breath.   albuterol  (2.5 MG/3ML) 0.083% nebulizer solution Commonly known as: PROVENTIL  Take 3 mLs (2.5 mg total) by nebulization every 6 (six) hours as needed for wheezing or shortness of breath.   amLODipine  10 MG tablet Commonly known as: NORVASC  Take 1 tablet (10 mg total) by mouth daily.   apixaban  5 MG Tabs tablet Commonly known as: ELIQUIS  Take 2 tablets (10 mg total) by mouth 2 (two) times daily for 6 days, THEN 1 tablet (5 mg total) 2 (two) times daily for 23 days. Start taking on: February 18, 2024   arformoterol  15 MCG/2ML Nebu Commonly known as: BROVANA  Take 2 mLs (15 mcg total) by nebulization 2 (two) times daily.   budesonide  0.5 MG/2ML nebulizer solution Commonly known as: Pulmicort  Take 2 mLs (0.5 mg total) by nebulization 2 (two) times daily.   camphor-menthol  lotion Commonly known as: SARNA Apply topically 2 (two) times daily. What changed: how much to take   carvedilol  25 MG tablet Commonly known as: COREG  Take 1 tablet (25 mg total) by mouth 2 (two) times daily with a meal.   diclofenac  Sodium 1 % Gel Commonly known as: VOLTAREN Apply 4 g topically 4 (four) times daily.   feeding supplement (NEPRO CARB STEADY) Liqd Take 237 mLs by mouth as needed (missed meal during dialysis.).   ipratropium 0.03 % nasal spray Commonly known as: ATROVENT  Place 2 sprays into both nostrils every 12 (twelve) hours.   multivitamin Tabs tablet Take 1 tablet by mouth at bedtime.   pantoprazole  40 MG tablet Commonly known as: PROTONIX  Take 40 mg by mouth daily.   polyethylene glycol powder 17 GM/SCOOP powder Commonly known as:  GLYCOLAX /MIRALAX  Mix as directed and take 1 capful (17 g) by mouth daily. What changed:  when to take this reasons to take this   revefenacin  175 MCG/3ML nebulizer solution Commonly known as: YUPELRI  Take 3 mLs (175 mcg total) by nebulization daily.   VITAMIN D  PO Take 1 tablet by mouth daily.        Disposition and follow-up:   Mr.Recardo J Hartley was discharged from [facility] in Stable condition.  At the hospital follow up visit please address:  1.  Pt had an incidental R internal jugular vein DVT found on US  and was started on Eliquis , will need to be on therapy for 3 months total per [facility], 30 day supply sent.   2.  AVF access problems?: Patient completed HD successfully here without issues, Continue with dialysis as currently scheduled: needs follow up with [facility]  Please note that we discontinued the home medications that the patient was NOT taking/ get at HD, including: -Auryxia 1 GM 210 MG(Fe) tablet -benzonatate  100 MG capsule -busPIRone  5 MG tablet -calcitRIOL  0.25 MCG capsule -calcitRIOL  0.5 MCG capsule -famotidine  10 MG tablet -fluticasone  50 MCG/ACT nasal spray -gabapentin  300 MG capsule -hydrOXYzine  25 MG tablet -levocetirizine 5 MG tablet -lidocaine  2 % jelly -ondansetron  4 MG disintegrating tablet -promethazine  6.25 MG/5ML solution -Sensipar  60 MG tablet -sevelamer  carbonate 800 MG tablet -tiZANidine  4 MG tablet -traMADol  50 MG tablet -VENOFER  IV   3.  Labs / imaging needed at time of follow-up: RFP, CBC  4.  Pending labs/ test needing follow-up: n/a  Follow-up Appointments:  Hospital Course by problem list:  [name] is a 58 y.o. 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 He had recently (Friday) not been able to tolerate a full dialysis session because of HD access site pain. It was suspected that shortened dialysis and subsequent fluid overload was the reason for his shortness of breath, and dialysis was provided. He tolerated dialysis well on Monday and Tuesday without difficulties, he will need to follow up with [facility] outpatient.   R internal jugular DVT Because of the above listed concern for HD site, vascular surgery was consulted, and VAS doppler was performed, which was not concerning for his arm, but did find a R internal jugular DVT which was susected to be from prior catheter sites. Pt was monitored for pain and congestion during the rest of his stay. There were no concerns with flow for the HD access site, and access was obtained easily.  Eliquis  was started with the  loading dose 10 mg BID for 7 days followed by eliquis  5 mg BID for a total of 3 months per [facility].    #Chronic Health Conditions The following chronic health conditions were managed with minimal change from home regimen to no acute concern: HTN, bronchitis, ILD    Subjective Yesterday patient was able to tolerate his dialysis session without concern with 4L removed. CBC this morning was non concerning and his BMP was improved from the day prior. Spoke with Nephrology and  they agreed to schedule pt for dialysis again today. This morning he felt well, and denied fever, SOB, chest pain, and reported that he felt he could go home today. He was unaware of the blood clot that was found in his neck, so we explained that to him as well as emphasized the importance of taking the Eliquis  for the next 3 months for treatment of the clot.   Discharge Exam:   BP (!) 124/106   Pulse 73   Temp 97.9 F (36.6 C) (Oral)   Resp 14   Ht 5' 7 (1.702 m)   Wt 111.1 kg   SpO2 99%    BMI 38.36 kg/m   Physical Exam Vitals and nursing note reviewed. Exam conducted with a chaperone present.  Constitutional:      General: He is not in acute distress.    Appearance: He is not toxic-appearing.  Cardiovascular:     Rate and Rhythm: Normal rate and regular rhythm.     Pulses: Normal pulses.     Heart sounds: No murmur heard. Pulmonary:     Effort: Pulmonary effort is normal. No respiratory distress.     Breath sounds: Normal breath sounds. No stridor. No wheezing or rhonchi.  Abdominal:     General: There is no distension.     Palpations: Abdomen is soft.     Tenderness: There is no abdominal tenderness.  Musculoskeletal:     Right lower leg: No edema.     Left lower leg: No edema.  Skin:    General: Skin is warm and dry.  Neurological:     Mental Status: He is alert.      Pertinent Labs, Studies, and Procedures:     Latest Ref Rng & Units 02/18/2024    4:30 AM 02/17/2024    7:30 AM 02/12/2024    9:15 AM  CBC  WBC 4.0 - 10.5 K/uL 7.9  9.3  7.8   Hemoglobin 13.0 - 17.0 g/dL 88.7  89.5  88.5   Hematocrit 39.0 - 52.0 % 33.6  32.0  34.9   Platelets 150 - 400 K/uL 332  295  306        Latest Ref Rng & Units 02/18/2024    4:30 AM 02/17/2024    7:30 AM 02/12/2024    9:15 AM  CMP  Glucose 70 - 99 mg/dL 802  855  873   BUN 6 - 20 mg/dL 40  54  33   Creatinine 0.61 - 1.24 mg/dL 89.80  86.56  1.18   Sodium 135 - 145 mmol/L 134  138  135   Potassium 3.5 - 5.1 mmol/L 4.3  4.0  4.5   Chloride 98 - 111 mmol/L 92  99  95   CO2 22 - 32 mmol/L 25  21  28    Calcium  8.9 - 10.3 mg/dL 9.3  8.1  8.4   Total Protein 6.5 - 8.1 g/dL  6.5  6.9   Total Bilirubin 0.0 - 1.2  mg/dL  0.7  0.8   Alkaline Phos 38 - 126 U/L  113  119   AST 15 - 41 U/L  14  18   ALT 0 - 44 U/L  9  12     VAS US  UPPER EXTREMITY VENOUS DUPLEX Result Date: 02/18/2024 UPPER VENOUS STUDY  Patient Name:  HONDO NANDA Deridder  Date of Exam:   02/17/2024 Medical Rec #: [id number]        Accession #:     [id number] Date of Birth: [birth year]         Patient Gender: M Patient Age:   16 years Exam Location:  [facility] Procedure:      VAS US  UPPER EXTREMITY VENOUS DUPLEX Referring Phys: JEFFREY HATCHER --------------------------------------------------------------------------------  Indications: H/O of cephalic vein stenosis s/p stent placement and dialysis access pain., SOB, Pain, and Swelling Comparison Study: No prior exam. Performing Technologist: Edilia Elden Appl  Examination Guidelines: A complete evaluation includes B-mode imaging, spectral Doppler, color Doppler, and power Doppler as needed of all accessible portions of each vessel. Bilateral testing is considered an integral part of a complete examination. Limited examinations for reoccurring indications may be performed as noted.  Right Findings: +----------+------------+---------+-----------+----------+-------+ RIGHT     CompressiblePhasicitySpontaneousPropertiesSummary +----------+------------+---------+-----------+----------+-------+ IJV         Partial      Yes       Yes                      +----------+------------+---------+-----------+----------+-------+ Subclavian    Full       Yes       Yes                      +----------+------------+---------+-----------+----------+-------+ Axillary      Full       Yes       Yes                      +----------+------------+---------+-----------+----------+-------+ Brachial      Full       Yes       Yes                      +----------+------------+---------+-----------+----------+-------+ Radial        Full                                          +----------+------------+---------+-----------+----------+-------+ Ulnar         Full                                          +----------+------------+---------+-----------+----------+-------+ Cephalic      Full                                           +----------+------------+---------+-----------+----------+-------+ Basilic       Full       Yes       Yes                      +----------+------------+---------+-----------+----------+-------+ Deep vein thrombosis noted in the right internal jugular vein. Incidental finding, therefore a bilateral  upper extremity venous was performed.  Left Findings: +----------+------------+---------+-----------+----------+-------+ LEFT      CompressiblePhasicitySpontaneousPropertiesSummary +----------+------------+---------+-----------+----------+-------+ IJV           Full       Yes       Yes                      +----------+------------+---------+-----------+----------+-------+ Subclavian    Full       Yes       Yes                      +----------+------------+---------+-----------+----------+-------+ Axillary      Full       Yes       Yes                      +----------+------------+---------+-----------+----------+-------+ Brachial      Full       Yes       Yes                      +----------+------------+---------+-----------+----------+-------+ Radial        Full                                          +----------+------------+---------+-----------+----------+-------+ Ulnar         Full                                          +----------+------------+---------+-----------+----------+-------+ Basilic       Full       Yes       Yes                      +----------+------------+---------+-----------+----------+-------+ Left brachiocephalic dialysis access, supraclavicular and proximal upper arm stents are patent.  Summary:  Right: No evidence of superficial vein thrombosis in the upper extremity. Findings consistent with acute deep vein thrombosis involving the right internal jugular vein.  Left: No evidence of deep vein thrombosis in the upper extremity. No evidence of superficial vein thrombosis in the upper extremity. Dialysis access and stents are patent.   *See table(s) above for measurements and observations.  Diagnosing physician: Lonni Gaskins MD Electronically signed by Lonni Gaskins MD on 02/18/2024 at 7:55:51 AM.    Final    US  LT UPPER EXTREM LTD SOFT TISSUE NON VASCULAR Result Date: 02/17/2024 CLINICAL DATA:  Left upper extremity pain. EXAM: ULTRASOUND left UPPER EXTREMITY LIMITED TECHNIQUE: Ultrasound examination of the upper extremity soft tissues was performed in the area of clinical concern. COMPARISON:  None Available. FINDINGS: Targeted sonographic images of the soft tissues of the left upper extremity performed. There is diffuse subcutaneous edema. Clinical correlation recommended to evaluate for cellulitis. No fluid collection. A dilated vessel with calcified atherosclerotic plaque only partially visualized and not fully evaluated. This may represent an arterialized venous segment of an AV fistula. Clinical correlation recommended. Dedicated vascular ultrasound with flow interrogation may provide better evaluation if clinically indicated. IMPRESSION: 1. Subcutaneous edema may represent cellulitis. 2. Dilated vessel as above. Electronically Signed   By: Vanetta Chou M.D.   On: 02/17/2024 14:36   DG Chest Portable 1 View Result Date: 02/17/2024 CLINICAL DATA:  Shortness of breath EXAM: PORTABLE CHEST  1 VIEW COMPARISON:  Chest x-ray performed February 12, 2024 FINDINGS: Mildly enlarged heart. Prominence of the central pulmonary vasculature. Mild hypoventilatory changes. No discrete lobar infiltrate and no significant pleural effusion. IMPRESSION: 1. Mildly enlarged heart with mild central congestion. Electronically Signed   By: Maude Naegeli M.D.   On: 02/17/2024 07:25   Signed: Kandis [name], DO 02/18/2024, 4:21 PM

## 2024-02-19 ENCOUNTER — Encounter

## 2024-02-19 ENCOUNTER — Telehealth (HOSPITAL_COMMUNITY): Payer: Self-pay | Admitting: Nephrology

## 2024-02-19 ENCOUNTER — Telehealth: Payer: Self-pay

## 2024-02-19 ENCOUNTER — Telehealth: Payer: Self-pay | Admitting: *Deleted

## 2024-02-19 LAB — HEPATITIS B SURFACE ANTIBODY, QUANTITATIVE: Hep B S AB Quant (Post): 997 m[IU]/mL

## 2024-02-19 NOTE — Telephone Encounter (Signed)
 Pa has already been approved. I lvm for patient. Please see my note from today.

## 2024-02-19 NOTE — Telephone Encounter (Signed)
 Transition of care contact from inpatient facility  Date of Discharge: 02/18/2024 Date of Contact: 02/19/2024 - attempt #1 Method of contact: Phone  Attempted to contact patient to discuss transition of care from inpatient admission. Patient did not answer the phone. Message was left on the patient's voicemail with call back number [phone].  Izetta Boehringer, PA-C [employer] Pager [phone]

## 2024-02-19 NOTE — Telephone Encounter (Signed)
 Prior Authorization for patient (Eliquis  5MG  tablets) came through on cover my meds was submitted awaiting approval or denial.  XZB:[id number]

## 2024-02-19 NOTE — Telephone Encounter (Signed)
 Copied from CRM [id number]. Topic: Clinical - Medication Prior Auth >> Feb 19, 2024 10:49 AM Chiquita SQUIBB wrote: Reason for CRM: PJ from [employer] is calling in regarding the prior authorization for Eliquis . They had questions regarding the amount before they can approve it. A good call back number is [phone] reference number [id number]

## 2024-02-19 NOTE — Telephone Encounter (Signed)
 Jabril Oliff (Key: [id number]) PA Case ID #: [id number] Rx #: [id number] Need Help? Call us  at [phone] Outcome Approved today by [employer] 2017 NCPDP Request Reference Number: [id number]. ELIQUIS  TAB 5MG  is approved through 04/08/2025. Your patient may now fill this prescription and it will be covered. Effective Date: 02/19/2024 Authorization Expiration Date: 04/08/2025 Drug Eliquis  5MG  tablets ePA cloud logo Form [employer] Medicare Part D Electronic Prior Authorization Form (2017 NCPDP) Original Claim Info [phone]  Lvm regarding the approval.

## 2024-02-20 ENCOUNTER — Ambulatory Visit

## 2024-02-20 ENCOUNTER — Encounter

## 2024-02-20 DIAGNOSIS — I480 Paroxysmal atrial fibrillation: Secondary | ICD-10-CM

## 2024-02-20 LAB — CUP PACEART REMOTE DEVICE CHECK
Date Time Interrogation Session: 20251112234945
Implantable Pulse Generator Implant Date: 20250711

## 2024-02-25 ENCOUNTER — Ambulatory Visit: Payer: Self-pay | Admitting: Cardiology

## 2024-02-25 NOTE — Progress Notes (Signed)
 Remote Loop Recorder Transmission

## 2024-03-10 ENCOUNTER — Encounter (HOSPITAL_COMMUNITY): Payer: Self-pay

## 2024-03-12 ENCOUNTER — Ambulatory Visit (HOSPITAL_COMMUNITY)
Admission: RE | Admit: 2024-03-12 | Discharge: 2024-03-12 | Disposition: A | Discharge status: Home / Self Care | Attending: Vascular Surgery | Admitting: Vascular Surgery

## 2024-03-12 ENCOUNTER — Other Ambulatory Visit: Payer: Self-pay

## 2024-03-12 ENCOUNTER — Encounter (HOSPITAL_COMMUNITY)
Admission: RE | Disposition: A | Discharge status: Home / Self Care | Payer: Self-pay | Source: Home / Self Care | Attending: Vascular Surgery

## 2024-03-12 HISTORY — PX: A/V FISTULAGRAM: CATH118298

## 2024-03-12 SURGERY — A/V FISTULAGRAM
Anesthesia: LOCAL | Site: Arm Upper | Laterality: Left

## 2024-03-12 MED ORDER — IODIXANOL 320 MG/ML IV SOLN
INTRAVENOUS | Status: DC | PRN
  Administered 2024-03-12: 20 mL via INTRAVENOUS

## 2024-03-12 MED ORDER — HEPARIN (PORCINE) IN NACL 1000-0.9 UT/500ML-% IV SOLN
INTRAVENOUS | Status: DC | PRN
  Administered 2024-03-12: 500 mL

## 2024-03-12 MED ORDER — LIDOCAINE HCL (PF) 1 % IJ SOLN
INTRAMUSCULAR | Status: AC
  Filled 2024-03-12: qty 30

## 2024-03-12 MED ORDER — LIDOCAINE HCL (PF) 1 % IJ SOLN
INTRAMUSCULAR | Status: DC | PRN
  Administered 2024-03-12: 2 mL via SUBCUTANEOUS

## 2024-03-12 SURGICAL SUPPLY — 5 items
KIT MICROPUNCTURE NIT STIFF (SHEATH) IMPLANT
SHEATH PROBE COVER 6X72 (BAG) IMPLANT
STOPCOCK MORSE 400PSI 3WAY (MISCELLANEOUS) IMPLANT
TRAY PV CATH (CUSTOM PROCEDURE TRAY) ×1 IMPLANT
TUBING CIL FLEX 10 FLL-RA (TUBING) IMPLANT

## 2024-03-12 NOTE — Op Note (Signed)
    Patient name: [name] MRN: [id number] DOB: 1965/12/18 Sex: male  03/12/2024 Pre-operative Diagnosis: End-stage renal disease, pain with dialysis Post-operative diagnosis:  Same Surgeon:  Penne BROCKS. Sheree, MD Procedure Performed: 1.  Percutaneous ultrasound-guided cannulation left arm AV fistula 2.  Left upper extremity fistulogram  Indications: 58 year old male with history of end-stage renal disease on dialysis via left arm cephalic vein fistula that underwent recent stenting.  He is also been subsequently diagnosed with DVT and placed on anticoagulation.  He is having pain with dialysis particularly towards the end of session which is causing early termination.  We have discussed his options and he has elected to proceed with fistulogram with possible invention.  Findings: Fistula throughout its course is patent there are 2 stents which are both patent no intervention was undertaken.  We discussed that if patient has continued pain we can consider placing a catheter to allow the fistula to rest prior to considering right upper extremity access and abandoning the left.   Procedure:  The patient was identified in the holding area and taken to heart and vascular procedure room.  The patient was then placed supine on the table and prepped and draped in the usual sterile fashion.  A time out was called.  Ultrasound was used to evaluate the left arm AV fistula which was noted to be patent.  The area was anesthetized 1% lidocaine  and cannulated with a micropuncture needle followed by wire and sheath using direct ultrasound visualization and ultrasound images saved to the permanent record.  We then performed left upper extremity fistulogram with the above findings we elected no intervention.  Retrograde imaging with compression of the fistula was also performed.  The sheath was then removed and the cannulation site was suture-ligated.  He tolerated procedure without Ameeth  complication.  Contrast: 20 cc    Trinh Sanjose C. Sheree, MD [facility] Office: [phone] Pager: [phone]

## 2024-03-12 NOTE — H&P (Signed)
 H+P    History of Present Illness: This is a 58 y.o. male on dialysis via left arm AV fistula initially created in 2021.  He has undergone plication and most recently cephalic arch stenting for improved venous flow.  Patient was subsequently diagnosed with DVT and has been placed on anticoagulation which he continues to take.  He continues to have issues with dialysis with pain in his upper arm and shoulder area particularly at the end of dialysis sessions.  He states that the fistula continues to work well for dialysis.  Past Medical History:  Diagnosis Date   Adjustment disorder with mixed anxiety and depressed mood 12/28/2014   Asthma    Bacteremia due to Pseudomonas 12/01/2022   CHF (congestive heart failure) (HCC)    CPAP (continuous positive airway pressure) dependence    Diabetes mellitus without complication (HCC)    DM (diabetes mellitus) type II controlled with renal manifestation (HCC) 12/28/2014   DM (diabetes mellitus) type II controlled, neurological manifestation (HCC) 12/28/2014   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HCC)    Encephalopathy, hypertensive 12/28/2014   ESRD on hemodialysis (HCC)    MWF at [facility] / [facility]   GERD (gastroesophageal reflux disease)    Hypertension    Hypertensive emergency without congestive heart failure 12/27/2014   Pleural effusion    Pneumonia    Possible Panic disorder 12/28/2014   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   Sleep apnea     Past Surgical History:  Procedure Laterality Date   A/V FISTULAGRAM N/A 05/23/2023   Procedure: A/V Fistulagram;  Surgeon: Norine Manuelita LABOR, MD;  [facility];  Service: Cardiovascular;  Laterality: N/A;   A/V FISTULAGRAM Left 02/05/2024   Procedure: A/V Fistulagram;  Surgeon: Pearline Norman RAMAN, MD;  Location: [facility];  Service: Cardiovascular;  Laterality: Left;   A/V SHUNT INTERVENTION Left 11/14/2023   Procedure: A/V SHUNT INTERVENTION;  Surgeon: Serene Gaile ORN, MD;  Location: [facility];  Service:  Cardiovascular;  Laterality: Left;   AV FISTULA PLACEMENT Left 01/05/2020   Procedure: LEFT ARM BRACHIOCEPHALIC ARTERIOVENOUS (AV) FISTULA;  Surgeon: Eliza Lonni RAMAN, MD;  Location: [facility] OR;  Service: Vascular;  Laterality: Left;   EMPYEMA DRAINAGE Right 05/17/2017   Procedure: EMPYEMA DRAINAGE AND DECORTICATION;  Surgeon: Army Dallas NOVAK, MD;  Location: [facility] OR;  Service: Thoracic;  Laterality: Right;   FLEXIBLE BRONCHOSCOPY  05/17/2017   Procedure: FLEXIBLE BRONCHOSCOPY;  Surgeon: Army Dallas NOVAK, MD;  Location: [facility] OR;  Service: Thoracic;;   INSERTION OF DIALYSIS CATHETER Right 12/12/2021   Procedure: INSERTION OF RIGHT INTERNAL JUGULAR DIALYSIS CATHETER;  Surgeon: Eliza Lonni RAMAN, MD;  Location: [facility] OR;  Service: Vascular;  Laterality: Right;   INSERTION OF DIALYSIS CATHETER N/A 11/27/2023   Procedure: INSERTION OF DIALYSIS CATHETER;  Surgeon: Serene Gaile ORN, MD;  [facility]: [facility];  Service: Vascular;  Laterality: N/A;   IR FLUORO GUIDE CV LINE RIGHT  01/01/2020   IR REMOVAL TUN CV CATH W/O FL  01/14/2024   IR THORACENTESIS ASP PLEURAL SPACE W/IMG GUIDE  05/17/2017   IR US  GUIDE VASC ACCESS RIGHT  01/01/2020   PERIPHERAL VASCULAR BALLOON ANGIOPLASTY  05/23/2023   Procedure: PERIPHERAL VASCULAR BALLOON ANGIOPLASTY;  Surgeon: Norine Manuelita LABOR, MD;  [facility];  Service: Cardiovascular;;   REVISON OF ARTERIOVENOUS FISTULA Left 12/12/2021   Procedure: REVISION OF LEFT ARM FISTULA BY PLICATION;  Surgeon: Eliza Lonni RAMAN, MD;  Location: [facility] OR;  Service: Vascular;  Laterality: Left;  REVISON OF ARTERIOVENOUS FISTULA Left 11/27/2023   Procedure: Aneurysm plication of left arm;  Surgeon: Serene Gaile ORN, MD;  Location: [facility] OR;  Service: Vascular;  Laterality: Left;   THORACOTOMY/LOBECTOMY Right 05/17/2017   Procedure: RIGHT MINI THORACOTOMY;  Surgeon: Army Dallas NOVAK, MD;  Location: [facility] OR;  Service: Thoracic;  Laterality: Right;   VENOUS ANGIOPLASTY   11/14/2023   Procedure: VENOUS ANGIOPLASTY;  Surgeon: Serene Gaile ORN, MD;  Location: [facility];  Service: Cardiovascular;;   VENOUS ANGIOPLASTY  02/05/2024   Procedure: VENOUS ANGIOPLASTY;  Surgeon: Pearline Norman RAMAN, MD;  Location: [facility];  Service: Cardiovascular;;   VENOUS STENT  02/05/2024   Procedure: VENOUS STENT;  Surgeon: Pearline Norman RAMAN, MD;  Location: [facility];  Service: Cardiovascular;;  85%cephalic arch   VIDEO ASSISTED THORACOSCOPY (VATS)/EMPYEMA Right 05/17/2017   Procedure: RIGHT VIDEO ASSISTED THORACOSCOPY (VATS)/EMPYEMA;  Surgeon: Army Dallas NOVAK, MD;  Location: [facility] OR;  Service: Thoracic;  Laterality: Right;    Allergies  Allergen Reactions   Hydrocodone  Itching   Oxycodone  Itching    Prior to Admission medications   Medication Sig Start Date End Date Taking? Authorizing Provider  acetaminophen  (TYLENOL ) 325 MG tablet Take 2 tablets (650 mg total) by mouth every 6 (six) hours as needed for mild pain (pain score 1-3) (or Fever >/= 101). 06/13/23   Rosendo Norleen BROCKS, MD  albuterol  (PROVENTIL ) (2.5 MG/3ML) 0.083% nebulizer solution Take 3 mLs (2.5 mg total) by nebulization every 6 (six) hours as needed for wheezing or shortness of breath. 06/13/23   Rosendo Norleen BROCKS, MD  albuterol  (VENTOLIN  HFA) 108 [phone] Base) MCG/ACT inhaler Inhale 2 puffs into the lungs every 6 (six) hours as needed for wheezing or shortness of breath. 09/25/22   Iva Marty Saltness, MD  amLODipine  (NORVASC ) 10 MG tablet Take 1 tablet (10 mg total) by mouth daily. 06/13/23   Rosendo Norleen BROCKS, MD  apixaban  (ELIQUIS ) 5 MG TABS tablet Take 2 tablets (10 mg total) by mouth 2 (two) times daily for 6 days, THEN 1 tablet (5 mg total) 2 (two) times daily for 23 days. 02/18/24 03/18/24  Kandis [name], DO  arformoterol  (BROVANA ) 15 MCG/2ML NEBU Take 2 mLs (15 mcg total) by nebulization 2 (two) times daily. 08/07/23   Kara Dorn NOVAK, MD  budesonide  (PULMICORT ) 0.5 MG/2ML nebulizer solution Take 2 mLs (0.5 mg total)  by nebulization 2 (two) times daily. 08/07/23   Kara Dorn NOVAK, MD  camphor-menthol  [facility]) lotion Apply topically 2 (two) times daily. Patient taking differently: Apply 1 Application topically 2 (two) times daily. 12/03/22   Nicholas Bar, MD  carvedilol  (COREG ) 25 MG tablet Take 1 tablet (25 mg total) by mouth 2 (two) times daily with a meal. 05/24/22   Jolaine Pac, DO  diclofenac  Sodium (VOLTAREN ) 1 % GEL Apply 4 g topically 4 (four) times daily. 02/18/24   Kandis [name], DO  hydrALAZINE  (APRESOLINE ) 50 MG tablet Take 1 tablet (50 mg total) by mouth 3 (three) times daily. Patient taking differently: Take 50 mg by mouth 3 (three) times daily. Patient out of medication 04/08/2023, for about a month. 02/19/23 10/18/23  Elnora Ip, MD  ipratropium (ATROVENT ) 0.03 % nasal spray Place 2 sprays into both nostrils every 12 (twelve) hours. 08/07/23   Dewald, Jonathan B, MD  multivitamin (RENA-VIT) TABS tablet Take 1 tablet by mouth at bedtime. 05/24/22   Jolaine Pac, DO  Nutritional Supplements (FEEDING SUPPLEMENT, NEPRO CARB STEADY,) LIQD Take 237 mLs by mouth as needed (  missed meal during dialysis.). 02/18/24   Kandis [name], DO  pantoprazole  (PROTONIX ) 40 MG tablet Take 40 mg by mouth daily.    [provider]  polyethylene glycol powder (GLYCOLAX /MIRALAX ) 17 GM/SCOOP powder Mix as directed and take 1 capful (17 g) by mouth daily. Patient taking differently: Take 17 g by mouth daily as needed for mild constipation. 02/20/23   Elnora Ip, MD  revefenacin  (YUPELRI ) 175 MCG/3ML nebulizer solution Take 3 mLs (175 mcg total) by nebulization daily. 08/07/23   Kara Dorn NOVAK, MD  VITAMIN D  PO Take 1 tablet by mouth daily.    [provider]  insulin  aspart protamine  - aspart (NOVOLOG  MIX 70/30 FLEXPEN) (70-30) 100 UNIT/ML FlexPen Inject 15-30 Units into the skin 2 (two) times daily.  05/24/22  [provider]    Social History   Socioeconomic  History   Marital status: Married    Spouse name: Not on file   Number of children: Not on file   Years of education: Not on file   Highest education level: Not on file  Occupational History   Not on file  Tobacco Use   Smoking status: Never    Passive exposure: Never   Smokeless tobacco: Never  Vaping Use   Vaping status: Never Used  Substance and Sexual Activity   Alcohol use: No   Drug use: No   Sexual activity: Not on file  Other Topics Concern   Not on file  Social History Narrative   Not on file   Social Drivers of Health   Financial Resource Strain: Not on file  Food Insecurity: No Food Insecurity (02/17/2024)   Hunger Vital Sign    Worried About Running Out of Food in the Last Year: Never true    Ran Out of Food in the Last Year: Never true  Transportation Needs: No Transportation Needs (02/17/2024)   PRAPARE - Administrator, Civil Service (Medical): No    Lack of Transportation (Non-Medical): No  Physical Activity: Not on file  Stress: Not on file  Social Connections: Unknown (08/22/2021)   Received from [facility]   Social Network    Social Network: Not on file  Intimate Partner Violence: Not At Risk (02/17/2024)   Humiliation, Afraid, Rape, and Kick questionnaire    Fear of Current or Ex-Partner: No    Emotionally Abused: No    Physically Abused: No    Sexually Abused: No     Family History  Problem Relation Age of Onset   Hypertension Mother    Lung cancer Father     ROS: [x]  Positive   [ ]  Negative   [ ]  All sytems reviewed and are negative  Cardiovascular: []  chest pain/pressure []  palpitations []  SOB lying flat []  DOE []  pain in legs while walking []  pain in legs at rest []  pain in legs at night []  non-healing ulcers []  hx of DVT []  swelling in legs  Pulmonary: []  productive cough []  asthma/wheezing []  home O2  Neurologic: []  weakness in []  arms []  legs []  numbness in []  arms []  legs []  hx of CVA []  mini  stroke [] difficulty speaking or slurred speech []  temporary loss of vision in one eye []  dizziness  Hematologic: []  hx of cancer []  bleeding problems []  problems with blood clotting easily  Endocrine:   []  diabetes []  thyroid disease  GI []  vomiting blood []  blood in stool  GU: []  CKD/renal failure []  HD--[]  M/W/F or []  T/T/S []  burning  with urination []  blood in urine  Psychiatric: []  anxiety []  depression  Musculoskeletal: []  arthritis [X]  left upper arm and shoulder pain with dialysis  Integumentary: []  rashes []  ulcers  Constitutional: []  fever []  chills   Physical Examination  Vitals:   03/12/24 0811  BP: (!) 151/107  Pulse: 85  Resp: 16  SpO2: 98%   There is no height or weight on file to calculate BMI.  Physical Exam HENT:     Head: Normocephalic.     Nose: Nose normal.  Eyes:     Pupils: Pupils are equal, round, and reactive to light.  Cardiovascular:     Rate and Rhythm: Normal rate.  Pulmonary:     Effort: Pulmonary effort is normal.  Abdominal:     General: Abdomen is flat.  Musculoskeletal:     Comments: Left upper arm fistula with palpable thrill can be traced up to the cephalic arch  Neurological:     Mental Status: He is alert.      CBC    Component Value Date/Time   WBC 7.9 02/18/2024 0430   RBC 3.62 (L) 02/18/2024 0430   HGB 11.2 (L) 02/18/2024 0430   HGB 11.1 (L) 09/25/2022 1056   HCT 33.6 (L) 02/18/2024 0430   HCT 34.2 (L) 09/25/2022 1056   PLT 332 02/18/2024 0430   MCV 92.8 02/18/2024 0430   MCV 94 09/25/2022 1056   MCH 30.9 02/18/2024 0430   MCHC 33.3 02/18/2024 0430   RDW 13.4 02/18/2024 0430   RDW 13.2 09/25/2022 1056   LYMPHSABS 1.2 02/17/2024 0730   LYMPHSABS 1.8 09/25/2022 1056   MONOABS 0.6 02/17/2024 0730   EOSABS 0.1 02/17/2024 0730   EOSABS 0.2 09/25/2022 1056   BASOSABS 0.0 02/17/2024 0730   BASOSABS 0.0 09/25/2022 1056    BMET    Component Value Date/Time   NA 134 (L) 02/18/2024 0430   K  4.3 02/18/2024 0430   CL 92 (L) 02/18/2024 0430   CO2 25 02/18/2024 0430   GLUCOSE 197 (H) 02/18/2024 0430   BUN 40 (H) 02/18/2024 0430   CREATININE 10.19 (H) 02/18/2024 0430   CALCIUM  9.3 02/18/2024 0430   GFRNONAA 5 (L) 02/18/2024 0430   GFRAA 13 (L) 01/07/2020 0130    COAGS: Lab Results  Component Value Date   INR 1.14 05/16/2017   INR 1.12 12/27/2014     Non-Invasive Vascular Imaging:   No new studies   ASSESSMENT/PLAN: This is a 58 y.o. male with end-stage renal disease on dialysis via left cephalic vein AV fistula.  He continues to have pain in the arm with dialysis.  We have discussed proceeding with fistulogram.  I have offered him catheter to allow the fistula to rest at this time patient prefers to continue use of the fistula if possible.  Will plan fistulogram possible intervention today.  Bulmaro Feagans C. Sheree, MD [facility] Office: [phone] Pager: [phone]

## 2024-03-13 ENCOUNTER — Encounter (HOSPITAL_COMMUNITY): Payer: Self-pay | Admitting: Vascular Surgery

## 2024-03-21 ENCOUNTER — Encounter

## 2024-03-22 ENCOUNTER — Ambulatory Visit

## 2024-03-22 DIAGNOSIS — I480 Paroxysmal atrial fibrillation: Secondary | ICD-10-CM

## 2024-03-23 ENCOUNTER — Encounter

## 2024-03-24 LAB — CUP PACEART REMOTE DEVICE CHECK
Date Time Interrogation Session: 20251213233609
Implantable Pulse Generator Implant Date: 20250711

## 2024-03-25 ENCOUNTER — Ambulatory Visit: Payer: Self-pay | Admitting: Cardiology

## 2024-03-27 NOTE — Progress Notes (Signed)
 Remote Loop Recorder Transmission

## 2024-04-06 ENCOUNTER — Other Ambulatory Visit: Payer: Self-pay

## 2024-04-06 ENCOUNTER — Encounter (HOSPITAL_COMMUNITY): Payer: Self-pay

## 2024-04-06 ENCOUNTER — Observation Stay (HOSPITAL_COMMUNITY)
Admission: EM | Admit: 2024-04-06 | Discharge: 2024-04-11 | Disposition: A | Discharge status: Home / Self Care | Source: Ambulatory Visit | Attending: Family Medicine | Admitting: Family Medicine

## 2024-04-06 ENCOUNTER — Emergency Department (HOSPITAL_COMMUNITY)

## 2024-04-06 DIAGNOSIS — I2781 Cor pulmonale (chronic): Secondary | ICD-10-CM

## 2024-04-06 DIAGNOSIS — I132 Hypertensive heart and chronic kidney disease with heart failure and with stage 5 chronic kidney disease, or end stage renal disease: Secondary | ICD-10-CM | POA: Insufficient documentation

## 2024-04-06 DIAGNOSIS — Z91199 Patient's noncompliance with other medical treatment and regimen due to unspecified reason: Secondary | ICD-10-CM | POA: Diagnosis not present

## 2024-04-06 DIAGNOSIS — I2489 Other forms of acute ischemic heart disease: Secondary | ICD-10-CM | POA: Diagnosis not present

## 2024-04-06 DIAGNOSIS — F419 Anxiety disorder, unspecified: Secondary | ICD-10-CM | POA: Diagnosis not present

## 2024-04-06 DIAGNOSIS — Z794 Long term (current) use of insulin: Secondary | ICD-10-CM | POA: Insufficient documentation

## 2024-04-06 DIAGNOSIS — E1122 Type 2 diabetes mellitus with diabetic chronic kidney disease: Secondary | ICD-10-CM

## 2024-04-06 DIAGNOSIS — I2721 Secondary pulmonary arterial hypertension: Secondary | ICD-10-CM

## 2024-04-06 DIAGNOSIS — I5033 Acute on chronic diastolic (congestive) heart failure: Principal | ICD-10-CM | POA: Diagnosis present

## 2024-04-06 DIAGNOSIS — E119 Type 2 diabetes mellitus without complications: Secondary | ICD-10-CM

## 2024-04-06 DIAGNOSIS — Z79899 Other long term (current) drug therapy: Secondary | ICD-10-CM | POA: Diagnosis not present

## 2024-04-06 DIAGNOSIS — G4733 Obstructive sleep apnea (adult) (pediatric): Secondary | ICD-10-CM | POA: Diagnosis not present

## 2024-04-06 DIAGNOSIS — I959 Hypotension, unspecified: Secondary | ICD-10-CM

## 2024-04-06 DIAGNOSIS — G4730 Sleep apnea, unspecified: Secondary | ICD-10-CM | POA: Diagnosis present

## 2024-04-06 DIAGNOSIS — Z86718 Personal history of other venous thrombosis and embolism: Secondary | ICD-10-CM | POA: Diagnosis not present

## 2024-04-06 DIAGNOSIS — J410 Simple chronic bronchitis: Secondary | ICD-10-CM

## 2024-04-06 DIAGNOSIS — I480 Paroxysmal atrial fibrillation: Secondary | ICD-10-CM | POA: Diagnosis present

## 2024-04-06 DIAGNOSIS — N186 End stage renal disease: Secondary | ICD-10-CM

## 2024-04-06 DIAGNOSIS — D649 Anemia, unspecified: Secondary | ICD-10-CM | POA: Insufficient documentation

## 2024-04-06 DIAGNOSIS — J45909 Unspecified asthma, uncomplicated: Secondary | ICD-10-CM | POA: Diagnosis not present

## 2024-04-06 DIAGNOSIS — Z992 Dependence on renal dialysis: Secondary | ICD-10-CM | POA: Insufficient documentation

## 2024-04-06 DIAGNOSIS — E8770 Fluid overload, unspecified: Secondary | ICD-10-CM | POA: Insufficient documentation

## 2024-04-06 DIAGNOSIS — R0602 Shortness of breath: Secondary | ICD-10-CM | POA: Diagnosis present

## 2024-04-06 DIAGNOSIS — I10 Essential (primary) hypertension: Secondary | ICD-10-CM | POA: Diagnosis present

## 2024-04-06 DIAGNOSIS — J441 Chronic obstructive pulmonary disease with (acute) exacerbation: Principal | ICD-10-CM

## 2024-04-06 LAB — RESP PANEL BY RT-PCR (RSV, FLU A&B, COVID)  RVPGX2
Influenza A by PCR: NEGATIVE
Influenza B by PCR: NEGATIVE
Resp Syncytial Virus by PCR: NEGATIVE
SARS Coronavirus 2 by RT PCR: NEGATIVE

## 2024-04-06 LAB — I-STAT VENOUS BLOOD GAS, ED
Acid-Base Excess: 8 mmol/L — ABNORMAL HIGH (ref 0.0–2.0)
Bicarbonate: 31.6 mmol/L — ABNORMAL HIGH (ref 20.0–28.0)
Calcium, Ion: 1.03 mmol/L — ABNORMAL LOW (ref 1.15–1.40)
HCT: 34 % — ABNORMAL LOW (ref 39.0–52.0)
Hemoglobin: 11.6 g/dL — ABNORMAL LOW (ref 13.0–17.0)
O2 Saturation: 69 %
Potassium: 4.1 mmol/L (ref 3.5–5.1)
Sodium: 134 mmol/L — ABNORMAL LOW (ref 135–145)
TCO2: 33 mmol/L — ABNORMAL HIGH (ref 22–32)
pCO2, Ven: 39.5 mmHg — ABNORMAL LOW (ref 44–60)
pH, Ven: 7.51 — ABNORMAL HIGH (ref 7.25–7.43)
pO2, Ven: 32 mmHg (ref 32–45)

## 2024-04-06 LAB — BASIC METABOLIC PANEL WITH GFR
Anion gap: 16 — ABNORMAL HIGH (ref 5–15)
BUN: 43 mg/dL — ABNORMAL HIGH (ref 6–20)
CO2: 29 mmol/L (ref 22–32)
Calcium: 9.5 mg/dL (ref 8.9–10.3)
Chloride: 91 mmol/L — ABNORMAL LOW (ref 98–111)
Creatinine, Ser: 8.51 mg/dL — ABNORMAL HIGH (ref 0.61–1.24)
GFR, Estimated: 7 mL/min — ABNORMAL LOW
Glucose, Bld: 170 mg/dL — ABNORMAL HIGH (ref 70–99)
Potassium: 4.4 mmol/L (ref 3.5–5.1)
Sodium: 136 mmol/L (ref 135–145)

## 2024-04-06 LAB — CBC
HCT: 33.5 % — ABNORMAL LOW (ref 39.0–52.0)
Hemoglobin: 11.1 g/dL — ABNORMAL LOW (ref 13.0–17.0)
MCH: 31.4 pg (ref 26.0–34.0)
MCHC: 33.1 g/dL (ref 30.0–36.0)
MCV: 94.6 fL (ref 80.0–100.0)
Platelets: 410 K/uL — ABNORMAL HIGH (ref 150–400)
RBC: 3.54 MIL/uL — ABNORMAL LOW (ref 4.22–5.81)
RDW: 13.6 % (ref 11.5–15.5)
WBC: 9.7 K/uL (ref 4.0–10.5)
nRBC: 0 % (ref 0.0–0.2)

## 2024-04-06 LAB — TROPONIN T, HIGH SENSITIVITY
Troponin T High Sensitivity: 208 ng/L (ref 0–19)
Troponin T High Sensitivity: 201 ng/L (ref 0–19)

## 2024-04-06 MED ORDER — CHLORHEXIDINE GLUCONATE CLOTH 2 % EX PADS: 6.0000 | MEDICATED_PAD | Freq: Every day | CUTANEOUS | Status: DC

## 2024-04-06 MED ORDER — FUROSEMIDE 10 MG/ML IJ SOLN
40.0000 mg | Freq: Once | INTRAMUSCULAR | Status: AC
  Administered 2024-04-06: 40 mg via INTRAVENOUS
  Filled 2024-04-06: qty 4

## 2024-04-06 NOTE — ED Notes (Signed)
Attempted PIV x 2 with no success.

## 2024-04-06 NOTE — ED Notes (Signed)
 Attempted to insert IV x2, unsuccessful both times

## 2024-04-06 NOTE — ED Provider Notes (Signed)
 " [place] [facility] DEPARTMENT AT [facility] Provider Note   HPI/ROS    History obtained from patient and family member.  [name] is a 58 y.o. male who presents for No chief complaint on file. and who  has a past medical history of Adjustment disorder with mixed anxiety and depressed mood (12/28/2014), Asthma, Bacteremia due to Pseudomonas (12/01/2022), CHF (congestive heart failure) (HCC), CPAP (continuous positive airway pressure) dependence, Diabetes mellitus without complication (HCC), DM (diabetes mellitus) type II controlled with renal manifestation (HCC) (12/28/2014), DM (diabetes mellitus) type II controlled, neurological manifestation (HCC) (12/28/2014), 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HCC), Encephalopathy, hypertensive (12/28/2014), ESRD on hemodialysis (HCC), GERD (gastroesophageal reflux disease), Hypertension, Hypertensive emergency without congestive heart failure (12/27/2014), Pleural effusion, Pneumonia, Possible Panic disorder (12/28/2014), 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  Patient presents today for shortness of breath.  States over the last few days he has had worsening shortness of breath at home and generalized bodyaches.  He thought he had the flu.  Has not been febrile at home or had vomiting or diarrhea.  States has had shortness of breath and a persistent cough over the last 2 years.  He never smoked and has no known history of COPD/emphysema.  States he had full dialysis session today, but per the family member he had to be put on oxygen during dialysis because his oxygen levels were low.  States that over the last several days every time he gets up and walks around his left side of his chest starts hurting and he feels short of breath.  When he sits down it feels a little bit better, but he still requires 4-5 pillows to sleep at night.  Does still have chest pain intermittently and shortness of breath while at rest also.  States he is unable to lie flat  almost ever.  Denies any change in his sputum, and just endorses plan clear sputum that has been there and his cough for roughly 2 to 3 years.  Denies any changes in medications or recent travel.  MDM   I have reviewed the nursing documentation, vital signs, as well as the past medical history, surgical history, family history, and social history.  Initial Assessment:  Patient hemodynamically stable on initial evaluation.  Was satting 100% on liters nasal cannula on initial evaluation.  Oxygen shut off and patient continued to sat above 95%.  Scattered rhonchi and exam consistent with volume overload and patient also has some edema peripherally.  Does not appear grossly volume overloaded in the extremities though and received a full dialysis session today.  Labs obtained in first look with stable chronic anemia likely secondary to anemia of chronic disease, no significant leukocytosis.  Mild thrombocytosis of unclear etiology, could be reactive.  BMP with mild hypochloremia with stable baseline creatinine.  Anion gap likely secondary to elevated BUN.  VBG here with mild respiratory alkalosis without signs of significant bicarb retention or hypoxia.  Initial troponin here very elevated from prior at 208.  Only had had troponin I in the past, but has had only been in the 30s.  Chest x-ray here with large cardiac silhouette with  bilateral fluffy traits consistent with pulmonary edema.  No obvious focal airspace disease, no subdiaphragmatic air, or pneumothorax.  Agree with radiology.  EKG here Sinus rhythm with no obvious ischemia, dysrhythmia, or high-grade AV block.    Disposition:  {ED Dispo:29898}    This patient was staffed with Dr. PIERRETTE who supervised the visit and agreed with  the plan of care.   Due to the patients current presenting symptoms, physical exam findings, and the workup stated above, it is thought that the etiology of the patients current presentation is: No diagnosis  found.   Clinical Complexity A medically appropriate history, review of systems, and physical exam was performed.  Factors that affect the complexity of this encounter: {DATA REVIEWED HX MDM AMOUNT/COMPLEXITY LIMITED:20770::assessment of correct protocol,laboratory work from this visit,notes from other physicians (***),review of echocardiogram/EKG results}  My independent interpretations of diagnostic studies are documented in the ED course above.   If decision rules were used in this patient's evaluation, they are listed below.  *** Click here for ABCD2, HEART and other calculatorsREFRESH Note before signing   Patient's presentation is most consistent with {EM COPA:27473}  MDM generated using voice dictation software and may contain dictation errors. Please contact me for any clarification or with any questions.    Physical Exam, PMH, PSH, Family History, and Social Hsitory   Vitals:   04/06/24 1440 04/06/24 1741  BP: (!) 183/104 (!) 161/105  Pulse: 91 83  Resp: (!) 25 18  Temp: 98.3 F (36.8 C) 98.1 F (36.7 C)  TempSrc: Oral   SpO2: 95% 96%    Physical Exam Constitutional:      Appearance: He is obese.  HENT:     Mouth/Throat:     Mouth: Mucous membranes are moist.     Pharynx: Oropharynx is clear.  Eyes:     Extraocular Movements: Extraocular movements intact.     Conjunctiva/sclera: Conjunctivae normal.  Cardiovascular:     Rate and Rhythm: Normal rate and regular rhythm.  Pulmonary:     Effort: Pulmonary effort is normal.     Breath sounds: Rhonchi (scattered) present.  Abdominal:     Palpations: Abdomen is soft.     Tenderness: There is no abdominal tenderness. There is no guarding or rebound.  Musculoskeletal:     Right lower leg: Edema (trace) present.     Left lower leg: Edema (trace) present.  Skin:    General: Skin is warm and dry.     Capillary Refill: Capillary refill takes less than 2 seconds.  Neurological:     General: No focal deficit  present.     Mental Status: He is alert and oriented to person, place, and time.     Past Medical History:  Diagnosis Date   Adjustment disorder with mixed anxiety and depressed mood 12/28/2014   Asthma    Bacteremia due to Pseudomonas 12/01/2022   CHF (congestive heart failure) (HCC)    CPAP (continuous positive airway pressure) dependence    Diabetes mellitus without complication (HCC)    DM (diabetes mellitus) type II controlled with renal manifestation (HCC) 12/28/2014   DM (diabetes mellitus) type II controlled, neurological manifestation (HCC) 12/28/2014   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HCC)    Encephalopathy, hypertensive 12/28/2014   ESRD on hemodialysis (HCC)    MWF at [facility] / [facility]   GERD (gastroesophageal reflux disease)    Hypertension    Hypertensive emergency without congestive heart failure 12/27/2014   Pleural effusion    Pneumonia    Possible Panic disorder 12/28/2014   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   Sleep apnea      Past Surgical History:  Procedure Laterality Date   A/V FISTULAGRAM N/A 05/23/2023   Procedure: A/V Fistulagram;  Surgeon: Norine Manuelita LABOR, MD;  [facility];  Service: Cardiovascular;  Laterality: N/A;   A/V FISTULAGRAM Left 02/05/2024  Procedure: A/V Fistulagram;  Surgeon: Pearline Norman RAMAN, MD;  Location: [facility];  Service: Cardiovascular;  Laterality: Left;   A/V FISTULAGRAM Left 03/12/2024   Procedure: A/V Fistulagram;  Surgeon: Sheree Penne Bruckner, MD;  Location: [facility];  Service: Cardiovascular;  Laterality: Left;   A/V SHUNT INTERVENTION Left 11/14/2023   Procedure: A/V SHUNT INTERVENTION;  Surgeon: Serene Gaile ORN, MD;  Location: [facility];  Service: Cardiovascular;  Laterality: Left;   AV FISTULA PLACEMENT Left 01/05/2020   Procedure: LEFT ARM BRACHIOCEPHALIC ARTERIOVENOUS (AV) FISTULA;  Surgeon: Eliza Bruckner RAMAN, MD;  Location: [facility] OR;  Service: Vascular;  Laterality: Left;   EMPYEMA  DRAINAGE Right 05/17/2017   Procedure: EMPYEMA DRAINAGE AND DECORTICATION;  Surgeon: Army Dallas NOVAK, MD;  Location: [facility] OR;  Service: Thoracic;  Laterality: Right;   FLEXIBLE BRONCHOSCOPY  05/17/2017   Procedure: FLEXIBLE BRONCHOSCOPY;  Surgeon: Army Dallas NOVAK, MD;  Location: [facility] OR;  Service: Thoracic;;   INSERTION OF DIALYSIS CATHETER Right 12/12/2021   Procedure: INSERTION OF RIGHT INTERNAL JUGULAR DIALYSIS CATHETER;  Surgeon: Eliza Bruckner RAMAN, MD;  Location: [facility] OR;  Service: Vascular;  Laterality: Right;   INSERTION OF DIALYSIS CATHETER N/A 11/27/2023   Procedure: INSERTION OF DIALYSIS CATHETER;  Surgeon: Serene Gaile ORN, MD;  Location: [facility] OR;  Service: Vascular;  Laterality: N/A;   IR FLUORO GUIDE CV LINE RIGHT  01/01/2020   IR REMOVAL TUN CV CATH W/O FL  01/14/2024   IR THORACENTESIS ASP PLEURAL SPACE W/IMG GUIDE  05/17/2017   IR US  GUIDE VASC ACCESS RIGHT  01/01/2020   PERIPHERAL VASCULAR BALLOON ANGIOPLASTY  05/23/2023   Procedure: PERIPHERAL VASCULAR BALLOON ANGIOPLASTY;  Surgeon: Norine Manuelita LABOR, MD;  [facility];  Service: Cardiovascular;;   REVISON OF ARTERIOVENOUS FISTULA Left 12/12/2021   Procedure: REVISION OF LEFT ARM FISTULA BY PLICATION;  Surgeon: Eliza Bruckner RAMAN, MD;  Location: [facility] OR;  Service: Vascular;  Laterality: Left;   REVISON OF ARTERIOVENOUS FISTULA Left 11/27/2023   Procedure: Aneurysm plication of left arm;  Surgeon: Serene Gaile ORN, MD;  Location: [facility] OR;  Service: Vascular;  Laterality: Left;   THORACOTOMY/LOBECTOMY Right 05/17/2017   Procedure: RIGHT MINI THORACOTOMY;  Surgeon: Army Dallas NOVAK, MD;  Location: [facility] OR;  Service: Thoracic;  Laterality: Right;   VENOUS ANGIOPLASTY  11/14/2023   Procedure: VENOUS ANGIOPLASTY;  Surgeon: Serene Gaile ORN, MD;  Location: [facility];  Service: Cardiovascular;;   VENOUS ANGIOPLASTY  02/05/2024   Procedure: VENOUS ANGIOPLASTY;  Surgeon: Pearline Norman RAMAN, MD;  Location: [facility];  Service: Cardiovascular;;   VENOUS STENT  02/05/2024   Procedure: VENOUS STENT;  Surgeon: Pearline Norman RAMAN, MD;  Location: [facility];  Service: Cardiovascular;;  85%cephalic arch   VIDEO ASSISTED THORACOSCOPY (VATS)/EMPYEMA Right 05/17/2017   Procedure: RIGHT VIDEO ASSISTED THORACOSCOPY (VATS)/EMPYEMA;  Surgeon: Army Dallas NOVAK, MD;  Location: [facility] OR;  Service: Thoracic;  Laterality: Right;     Family History  Problem Relation Age of Onset   Hypertension Mother    Lung cancer Father     Social History   Tobacco Use   Smoking status: Never    Passive exposure: Never   Smokeless tobacco: Never  Substance Use Topics   Alcohol use: No     Procedures   If procedures were preformed on this patient, they are listed below:  Procedures   Electronically signed by:   Glendia Carlin Ancona, M.D. PGY-2, Emergency Medicine   Please note  that this documentation was produced with the assistance of voice-to-text technology and may contain errors.  "

## 2024-04-06 NOTE — ED Triage Notes (Signed)
 Patient sent from dialysis for shob. He has been having shob for the past 3 days. He did complete his dialysis treatment today. He is also reporting he has chest pain and headache. Chest pain is on the left side does not radiate and feels like cramping.

## 2024-04-06 NOTE — ED Notes (Signed)
 CCMD called.

## 2024-04-06 NOTE — ED Provider Triage Note (Signed)
 Emergency Medicine Provider Triage Evaluation Note  Raiquan Chandler Villarruel , a 58 y.o. male  was evaluated in triage.  Pt complains of sob. Progressive worsening SOB ongoing for the past week, feeling sleepy, fatigue, cough, headache and chest pain.  Fnished full course of dialysis today.    Review of Systems  Positive: As above Negative: As above  Physical Exam  BP (!) 183/104 (BP Location: Right Arm)   Pulse 91   Temp 98.3 F (36.8 C) (Oral)   Resp (!) 25   SpO2 95%  Gen:   Sleepy but arousable Resp:  Normal effort  MSK:   Moves extremities without difficulty  Other:    Medical Decision Making  Medically screening exam initiated at 3:57 PM.  Appropriate orders placed.  Savyon Loken Callander was informed that the remainder of the evaluation will be completed by another provider, this initial triage assessment does not replace that evaluation, and the importance of remaining in the ED until their evaluation is complete.     Nivia Colon, PA-C 04/06/24 [phone]

## 2024-04-06 NOTE — H&P (Signed)
 " History and Physical    Patient: [name] Seib FMW:[id number] DOB: 23-May-1965 DOA: 04/06/2024 DOS: the patient was seen and examined on 04/06/2024 PCP: Rik Glinda DASEN, FNP  Patient coming from: Home  Chief Complaint: Cough and can't breathe  HPI: [name] is a 58 y.o. male with a history of ESRD on hemodialysis (MWF), HFpEF, HTN, T2DM, OSA not on CPAP, PAF, right internal jugular vein DVT, asthma, GERD who presented to the [facility] today with shortness of breath during dialysis. Per report, he was hypoxic at dialysis, briefly requiring supplemental oxygen, but was able to be weaned off prior to ED arrival.  He endorses progressively worsening exertional dyspnea/exercise intolerance over the past several months, initially only with exertion but worsening severity with less exertion triggering symptoms. There was mention of associated chest pain, though this seems inconsistent and he is currently chest pain free. He's had a dry cough for over a year that is unchanged, worse when reclining, using 4-5 pillows at night. He does have some body aches but no fever or known sick contacts.   In the ED he was not hypoxic, CXR with cardiomegaly and peribronchial thickening with trace fluid in fissures. Exam was without wheezing. Troponin T levels were elevated with flat trend (208 > 201) and no overtly ischemic ECG findings noted.  Cardiology was consulted for evaluation for concern of unstable angina, though suspect pulmojnary HTN may be driving most symptoms, not currently treating as unstable angina. Echo is planned. Nephrology also consulted for hemodialysis by EDP.  Review of Systems: As mentioned in the history of present illness. All other systems reviewed and are negative. Past Medical History:  Diagnosis Date   Adjustment disorder with mixed anxiety and depressed mood 12/28/2014   Asthma    Bacteremia due to Pseudomonas 12/01/2022   CHF (congestive heart failure) (HCC)    CPAP (continuous  positive airway pressure) dependence    Diabetes mellitus without complication (HCC)    DM (diabetes mellitus) type II controlled with renal manifestation (HCC) 12/28/2014   DM (diabetes mellitus) type II controlled, neurological manifestation (HCC) 12/28/2014   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HCC)    Encephalopathy, hypertensive 12/28/2014   ESRD on hemodialysis (HCC)    MWF at [facility] / [facility]   GERD (gastroesophageal reflux disease)    Hypertension    Hypertensive emergency without congestive heart failure 12/27/2014   Pleural effusion    Pneumonia    Possible Panic disorder 12/28/2014   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   Sleep apnea    Past Surgical History:  Procedure Laterality Date   A/V FISTULAGRAM N/A 05/23/2023   Procedure: A/V Fistulagram;  Surgeon: Norine Manuelita LABOR, MD;  Location: [facility] INVASIVE CV LAB;  Service: Cardiovascular;  Laterality: N/A;   A/V FISTULAGRAM Left 02/05/2024   Procedure: A/V Fistulagram;  Surgeon: Pearline Norman RAMAN, MD;  Location: [facility] PV LAB;  Service: Cardiovascular;  Laterality: Left;   A/V FISTULAGRAM Left 03/12/2024   Procedure: A/V Fistulagram;  Surgeon: Sheree Penne Bruckner, MD;  Location: [facility] PV LAB;  Service: Cardiovascular;  Laterality: Left;   A/V SHUNT INTERVENTION Left 11/14/2023   Procedure: A/V SHUNT INTERVENTION;  Surgeon: Serene Gaile ORN, MD;  Location: [facility] PV LAB;  Service: Cardiovascular;  Laterality: Left;   AV FISTULA PLACEMENT Left 01/05/2020   Procedure: LEFT ARM BRACHIOCEPHALIC ARTERIOVENOUS (AV) FISTULA;  Surgeon: Eliza Bruckner RAMAN, MD;  Location: [facility] OR;  Service: Vascular;  Laterality: Left;   EMPYEMA DRAINAGE Right 05/17/2017   Procedure: EMPYEMA DRAINAGE AND  DECORTICATION;  Surgeon: Army Dallas NOVAK, MD;  Location: [facility] OR;  Service: Thoracic;  Laterality: Right;   FLEXIBLE BRONCHOSCOPY  05/17/2017   Procedure: FLEXIBLE BRONCHOSCOPY;  Surgeon: Army Dallas NOVAK, MD;  Location: [facility] OR;  Service: Thoracic;;   INSERTION OF DIALYSIS  CATHETER Right 12/12/2021   Procedure: INSERTION OF RIGHT INTERNAL JUGULAR DIALYSIS CATHETER;  Surgeon: Eliza Lonni RAMAN, MD;  Location: [facility] OR;  Service: Vascular;  Laterality: Right;   INSERTION OF DIALYSIS CATHETER N/A 11/27/2023   Procedure: INSERTION OF DIALYSIS CATHETER;  Surgeon: Serene Gaile ORN, MD;  Location: [facility] OR;  Service: Vascular;  Laterality: N/A;   IR FLUORO GUIDE CV LINE RIGHT  01/01/2020   IR REMOVAL TUN CV CATH W/O FL  01/14/2024   IR THORACENTESIS ASP PLEURAL SPACE W/IMG GUIDE  05/17/2017   IR US  GUIDE VASC ACCESS RIGHT  01/01/2020   PERIPHERAL VASCULAR BALLOON ANGIOPLASTY  05/23/2023   Procedure: PERIPHERAL VASCULAR BALLOON ANGIOPLASTY;  Surgeon: Norine Manuelita LABOR, MD;  Location: [facility] INV[facility] CV LAB;  Service: Cardiovascular;;   REVISON OF ARTERIOVENOUS FISTULA Left 12/12/2021   Procedure: REVISION OF LEFT ARM FISTULA BY PLICATION;  Surgeon: Eliza Lonni RAMAN, MD;  Location: [facility] OR;  Service: Vascular;  Laterality: Left;   REVISON OF ARTERIOVENOUS FISTULA Left 11/27/2023   Procedure: Aneurysm plication of left arm;  Surgeon: Serene Gaile ORN, MD;  Location: [facility] OR;  Service: Vascular;  Laterality: Left;   THORACOTOMY/LOBECTOMY Right 05/17/2017   Procedure: RIGHT MINI THORACOTOMY;  Surgeon: Army Dallas NOVAK, MD;  Location: [facility] OR;  Service: Thoracic;  Laterality: Right;   VENOUS ANGIOPLASTY  11/14/2023   Procedure: VENOUS ANGIOPLASTY;  Surgeon: Serene Gaile ORN, MD;  Location: [facility] PV LAB;  Service: Cardiovascular;;   VENOUS ANGIOPLASTY  02/05/2024   Procedure: VENOUS ANGIOPLASTY;  Surgeon: Pearline Norman RAMAN, MD;  Location: [facility] PV LAB;  Service: Cardiovascular;;   VENOUS STENT  02/05/2024   Procedure: VENOUS STENT;  Surgeon: Pearline Norman RAMAN, MD;  Location: [facility] PV LAB;  Service: Cardiovascular;;  85%cephalic arch   VIDEO ASSISTED THORACOSCOPY (VATS)/EMPYEMA Right 05/17/2017   Procedure: RIGHT VIDEO ASSISTED THORACOSCOPY (VATS)/EMPYEMA;  Surgeon: Army Dallas NOVAK, MD;   Location: [facility] OR;  Service: Thoracic;  Laterality: Right;   Social History:  reports that he has never smoked. He has never been exposed to tobacco smoke. He has never used smokeless tobacco. He reports that he does not drink alcohol and does not use drugs.  Allergies[1]  Family History  Problem Relation Age of Onset   Hypertension Mother    Lung cancer Father     Prior to Admission medications  Medication Sig Start Date End Date Taking? Authorizing Provider  acetaminophen  (TYLENOL ) 325 MG tablet Take 2 tablets (650 mg total) by mouth every 6 (six) hours as needed for mild pain (pain score 1-3) (or Fever >/= 101). 06/13/23   Rosendo Norleen BROCKS, MD  albuterol  (PROVENTIL ) (2.5 MG/3ML) 0.083% nebulizer solution Take 3 mLs (2.5 mg total) by nebulization every 6 (six) hours as needed for wheezing or shortness of breath. 06/13/23   Rosendo Norleen BROCKS, MD  albuterol  (VENTOLIN  HFA) 108 [phone] Base) MCG/ACT inhaler Inhale 2 puffs into the lungs every 6 (six) hours as needed for wheezing or shortness of breath. 09/25/22   Iva Marty Saltness, MD  amLODipine  (NORVASC ) 10 MG tablet Take 1 tablet (10 mg total) by mouth daily. 06/13/23   Rosendo Norleen BROCKS, MD  apixaban  (ELIQUIS ) 5 MG TABS tablet Take 2 tablets (10 mg  total) by mouth 2 (two) times daily for 6 days, THEN 1 tablet (5 mg total) 2 (two) times daily for 23 days. 02/18/24 03/18/24  Kandis [name], DO  arformoterol  (BROVANA ) 15 MCG/2ML NEBU Take 2 mLs (15 mcg total) by nebulization 2 (two) times daily. 08/07/23   Kara Dorn NOVAK, MD  budesonide  (PULMICORT ) 0.5 MG/2ML nebulizer solution Take 2 mLs (0.5 mg total) by nebulization 2 (two) times daily. 08/07/23   Kara Dorn NOVAK, MD  camphor-menthol  [facility]) lotion Apply topically 2 (two) times daily. Patient taking differently: Apply 1 Application topically 2 (two) times daily. 12/03/22   Nicholas Bar, MD  carvedilol  (COREG ) 25 MG tablet Take 1 tablet (25 mg total) by mouth 2 (two) times daily with a meal. 05/24/22    Jolaine Pac, DO  diclofenac  Sodium (VOLTAREN ) 1 % GEL Apply 4 g topically 4 (four) times daily. 02/18/24   Kandis [name], DO  [Paused] hydrALAZINE  (APRESOLINE ) 50 MG tablet Take 1 tablet (50 mg total) by mouth 3 (three) times daily. Patient taking differently: Take 50 mg by mouth 3 (three) times daily. Patient out of medication 04/08/2023, for about a month. Wait to take this until your doctor or other care provider tells you to start again. 02/19/23 10/18/23  Elnora Ip, MD  ipratropium (ATROVENT ) 0.03 % nasal spray Place 2 sprays into both nostrils every 12 (twelve) hours. 08/07/23   Dewald, Jonathan B, MD  multivitamin (RENA-VIT) TABS tablet Take 1 tablet by mouth at bedtime. 05/24/22   Jolaine Pac, DO  Nutritional Supplements (FEEDING SUPPLEMENT, NEPRO CARB STEADY,) LIQD Take 237 mLs by mouth as needed (missed meal during dialysis.). 02/18/24   Kandis [name], DO  pantoprazole  (PROTONIX ) 40 MG tablet Take 40 mg by mouth daily.    [provider]  polyethylene glycol powder (GLYCOLAX /MIRALAX ) 17 GM/SCOOP powder Mix as directed and take 1 capful (17 g) by mouth daily. Patient taking differently: Take 17 g by mouth daily as needed for mild constipation. 02/20/23   Elnora Ip, MD  revefenacin  (YUPELRI ) 175 MCG/3ML nebulizer solution Take 3 mLs (175 mcg total) by nebulization daily. 08/07/23   Kara Dorn NOVAK, MD  VITAMIN D  PO Take 1 tablet by mouth daily.    [provider]  insulin  aspart protamine  - aspart (NOVOLOG  MIX 70/30 FLEXPEN) (70-30) 100 UNIT/ML FlexPen Inject 15-30 Units into the skin 2 (two) times daily.  05/24/22  [provider]    Physical Exam: Vitals:   04/06/24 1440 04/06/24 1741  BP: (!) 183/104 (!) 161/105  Pulse: 91 83  Resp: (!) 25 18  Temp: 98.3 F (36.8 C) 98.1 F (36.7 C)  TempSrc: Oral   SpO2: 95% 96%  Gen: No distress Pulm: Clear, respirations are nonlabored  CV: RRR, no MRG or pitting LE edema.  GI:  Soft, NT, ND, +BS Neuro: Alert and oriented, poor / limited / inconsistent historian. No new focal deficits. Ext: Warm, no deformities. LUA AVF +T/B Skin: No new rashes, lesions or ulcers on visualized skin   Data Reviewed: VBG: 7.51 / 39.5 K 4.4, bicarb 29, BUN 43, AG 16 Troponin T HS 208 > 201 WBC 9.7k, hgb 11.1g/dl normocytic, plt 589x SARS-CoV-2, RSV, flu PCR's negative.  CXR (personal interpretation): Hazy without focal opacities, cardiomegaly ECG (personal interpretation): NSR      Assessment and Plan: Acute on chronic HFpEF/biventricular failure:  - Unclear initially, though patient does report making urine, so given diuretic and nephrology has been consulted by EDP for consideration of additional dialysis 12/30.  -  Echocardiogram has been ordered based on cardiology recommendation. Depending on findings, may need to perform RHC this admission.   ESRD: Completed normal Tx on schedule today (MWF).  - Nephrology consulted for consideration of additional dialysis 12/30. Pt of Dr. Norine. Also on transplant list at [name].  Chronic demand myocardial ischemia: Troponin elevated with flat trend despite ESRD and no ongoing chest complaints. Had no reversible abnormalities on recent stress test. Cardiology does not suspect ACS at this time.  - Continue anticoagulation, beta blocker, not on statin  HTN: BP significantly elevated. ?adherence to outpatient regimen.  - Will primarily manage with volume, also restart home beta blocker, norvasc , hydralazine .   Medication nonadherence: This has been mentioned in prior notes and dispense history shows many medications that were dispensed but never picked up by the patient.  - Will need to reinforce this going forward.   OSA: Nonadherent to CPAP.  - Counseling provided, declines this while inpatient.   T2DM: Recent HbA1c 7.1%.  - Don't see any active orders for medications.  - SSI while hospitalized.  - No plan to start statin per  outpatient cardiology.  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  - Continue eliquis , now down to 5mg  dose  PAF: None currently detected - Continue routine monitoring with ILR per Dr. Inocencio.    Advance Care Planning: Full code  Consults: Cardiology, nephrology  Family Communication: None at bedside  Severity of Illness: The appropriate patient status for this patient is OBSERVATION. Observation status is judged to be reasonable and necessary in order to provide the required intensity of service to ensure the patient's safety. The patient's presenting symptoms, physical exam findings, and initial radiographic and laboratory data in the context of their medical condition is felt to place them at decreased risk for further clinical deterioration. Furthermore, it is anticipated that the patient will be medically stable for discharge from the hospital within 2 midnights of admission.   Author: Bernardino KATHEE Come, MD 04/06/2024 8:38 PM  For on call review [email or web address].      [1]  Allergies Allergen Reactions   Hydrocodone  Itching   Oxycodone  Itching   "

## 2024-04-06 NOTE — ED Notes (Signed)
 Trop of 208 reported by Lab.  [place] notified.

## 2024-04-06 NOTE — Consult Note (Signed)
 "  Cardiology Consultation   Patient ID: [name] MRN: [id number]; DOB: 1966-02-25  Admit date: 04/06/2024 Date of Consult: 04/06/2024  PCP:  Rik Glinda DASEN, FNP   [facility] Providers Cardiologist:  Shelda Bruckner, MD  Electrophysiologist:  Soyla [name], MD       Patient Profile: [name] is a 58 y.o. 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 Guillermina.  History of Present Illness: [name] has a longstanding history of multiple severe chronic medical problems including ESRD on hemodialysis MWF, type 2 diabetes mellitus, HTN, OSA on CPAP, paroxysmal atrial fibrillation, chronic HFpEF, asthma, remote history of empyema, GERD, who was referred to the emergency room today after developing hypoxia during dialysis.  For the last several weeks he has had exertional left-sided chest pain associated with shortness of breath that resolves when he rests.  It also appears he is describing 4-5 pillow orthopnea.  He has had a chronic cough for the last couple of years, but he has not had any recent fever, chills, sputum production.  He is currently asymptomatic at rest, except for recurrent dry cough.  He is a poor historian.  He falls asleep roughly every 5-10 seconds during the conversation.  He also gives inconsistent information.  A lot of the review of systems is obtained from his family.  They report that he becomes short of breath walking a distance of about 100 feet, across the street from his house to his mother's house.  He is constantly nodding off to sleep sitting up in a chair.  He snores very loudly and they have witnessed repeated apneic events.  When he is ready to go back to his own home, they have to help him across the street because he is extremely short of breath.  He has been unable  to have complete dialysis sessions for a while, since he developed cramps and sometimes hypotension.  Today his blood pressure is quite high.  He reports that he no longer has the CPAP equipment because he was told that he does not need it.  I think the equipment was retrieved since he failed to use it appropriately.  He reports that he still has urine output 5 or 6 times a day.  But he told the ER staff that he is completely anuric.  ECG shows normal sinus rhythm and does not show any ischemic changes.  He has moderately elevated high-sensitivity troponin T around 200 with a flat pattern and always has elevated troponin I levels over the last several years  (albeit at a lower level, roughly 2-3 times the upper limit of normal).  His chest x-ray shows cardiomegaly and peribronchial thickening that may be bronchitic or congestive.  An implantable loop recorder is noted in the left chest.  Also noted is the presence of a left subclavian stent, which appears to be a previously placed left arm cephalic vein fistula stent.  Reportedly had a heart attack in the 1980s while living in [place] .  He had a heart catheterization performed 2022 at [facility] that showed nonobstructive CAD, triggered by a false positive nuclear stress test that showed inferolateral ischemia.  A subsequent nuclear stress test performed in June 2025 again shows an inferolateral defect, this time a fixed defect with an associated hypokinesis abnormality.  Both studies showed  LVEF around 60%.  Was diagnosed with paroxysmal atrial fibrillation during a hospitalization at [facility] in 2020.  CHA2DS2-VASc score is 3, but he reports that his apixaban  was stopped by his nephrologist at some point in the past.  There has been no further documentation of atrial fibrillation since that time.  He has an implantable loop recorder implanted 10/18/18/2025 monitored by Dr. Inocencio with the most recent download just 2 weeks ago no atrial fibrillation  since implantation.  However, he has been restarted on Eliquis  after being diagnosed with DVT.  An echocardiogram performed in March 2025 shows severe left ventricular hypertrophy, EF 60 to 65% without wall motion abnormalities, grade 2 diastolic dysfunction with elevated filling pressures.  The right ventricle is described as mildly enlarged but with normal systolic function.  There is no significant valvular abnormality.     Past Medical History:  Diagnosis Date   Adjustment disorder with mixed anxiety and depressed mood 12/28/2014   Asthma    Bacteremia due to Pseudomonas 12/01/2022   CHF (congestive heart failure) (HCC)    CPAP (continuous positive airway pressure) dependence    Diabetes mellitus without complication (HCC)    DM (diabetes mellitus) type II controlled with renal manifestation (HCC) 12/28/2014   DM (diabetes mellitus) type II controlled, neurological manifestation (HCC) 12/28/2014   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HCC)    Encephalopathy, hypertensive 12/28/2014   ESRD on hemodialysis (HCC)    MWF at [facility] / [facility]   GERD (gastroesophageal reflux disease)    Hypertension    Hypertensive emergency without congestive heart failure 12/27/2014   Pleural effusion    Pneumonia    Possible Panic disorder 12/28/2014   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   Sleep apnea     Past Surgical History:  Procedure Laterality Date   A/V FISTULAGRAM N/A 05/23/2023   Procedure: A/V Fistulagram;  Surgeon: Norine Manuelita LABOR, MD;  Location: [facility] INVASIVE CV LAB;  Service: Cardiovascular;  Laterality: N/A;   A/V FISTULAGRAM Left 02/05/2024   Procedure: A/V Fistulagram;  Surgeon: Pearline Norman RAMAN, MD;  Location: [facility] PV LAB;  Service: Cardiovascular;  Laterality: Left;   A/V FISTULAGRAM Left 03/12/2024   Procedure: A/V Fistulagram;  Surgeon: Sheree Penne Bruckner, MD;  Location: [facility] PV LAB;  Service: Cardiovascular;  Laterality: Left;   A/V SHUNT INTERVENTION Left 11/14/2023   Procedure: A/V SHUNT  INTERVENTION;  Surgeon: Serene Gaile ORN, MD;  Location: [facility];  Service: Cardiovascular;  Laterality: Left;   AV FISTULA PLACEMENT Left 01/05/2020   Procedure: LEFT ARM BRACHIOCEPHALIC ARTERIOVENOUS (AV) FISTULA;  Surgeon: Eliza Bruckner RAMAN, MD;  Location: [facility] OR;  Service: Vascular;  Laterality: Left;   EMPYEMA DRAINAGE Right 05/17/2017   Procedure: EMPYEMA DRAINAGE AND DECORTICATION;  Surgeon: Army Dallas NOVAK, MD;  Location: [facility] OR;  Service: Thoracic;  Laterality: Right;   FLEXIBLE BRONCHOSCOPY  05/17/2017   Procedure: FLEXIBLE BRONCHOSCOPY;  Surgeon: Army Dallas NOVAK, MD;  Location: [facility] OR;  Service: Thoracic;;   INSERTION OF DIALYSIS CATHETER Right 12/12/2021   Procedure: INSERTION OF RIGHT INTERNAL JUGULAR DIALYSIS CATHETER;  Surgeon: Eliza Bruckner RAMAN, MD;  Location: [facility] OR;  Service: Vascular;  Laterality: Right;   INSERTION OF DIALYSIS CATHETER N/A 11/27/2023   Procedure: INSERTION OF DIALYSIS CATHETER;  Surgeon: Serene Gaile ORN, MD;  Location: [facility] OR;  Service: Vascular;  Laterality: N/A;   IR FLUORO GUIDE CV LINE RIGHT  01/01/2020   IR REMOVAL TUN CV CATH W/O FL  01/14/2024   IR THORACENTESIS ASP PLEURAL SPACE W/IMG  GUIDE  05/17/2017   IR US  GUIDE VASC ACCESS RIGHT  01/01/2020   PERIPHERAL VASCULAR BALLOON ANGIOPLASTY  05/23/2023   Procedure: PERIPHERAL VASCULAR BALLOON ANGIOPLASTY;  Surgeon: Norine Manuelita LABOR, MD;  Location: [facility];  Service: Cardiovascular;;   REVISON OF ARTERIOVENOUS FISTULA Left 12/12/2021   Procedure: REVISION OF LEFT ARM FISTULA BY PLICATION;  Surgeon: Eliza Lonni RAMAN, MD;  Location: [facility] OR;  Service: Vascular;  Laterality: Left;   REVISON OF ARTERIOVENOUS FISTULA Left 11/27/2023   Procedure: Aneurysm plication of left arm;  Surgeon: Serene Gaile ORN, MD;  Location: [facility] OR;  Service: Vascular;  Laterality: Left;   THORACOTOMY/LOBECTOMY Right 05/17/2017   Procedure: RIGHT MINI THORACOTOMY;  Surgeon: Army Dallas NOVAK, MD;   Location: [facility] OR;  Service: Thoracic;  Laterality: Right;   VENOUS ANGIOPLASTY  11/14/2023   Procedure: VENOUS ANGIOPLASTY;  Surgeon: Serene Gaile ORN, MD;  Location: [facility] LAB;  Service: Cardiovascular;;   VENOUS ANGIOPLASTY  02/05/2024   Procedure: VENOUS ANGIOPLASTY;  Surgeon: Pearline Norman RAMAN, MD;  Location: [facility];  Service: Cardiovascular;;   VENOUS STENT  02/05/2024   Procedure: VENOUS STENT;  Surgeon: Pearline Norman RAMAN, MD;  Location: [facility];  Service: Cardiovascular;;  85%cephalic arch   VIDEO ASSISTED THORACOSCOPY (VATS)/EMPYEMA Right 05/17/2017   Procedure: RIGHT VIDEO ASSISTED THORACOSCOPY (VATS)/EMPYEMA;  Surgeon: Army Dallas NOVAK, MD;  Location: [facility] OR;  Service: Thoracic;  Laterality: Right;     Home Medications:  Prior to Admission medications  Medication Sig Start Date End Date Taking? Authorizing Provider  acetaminophen  (TYLENOL ) 325 MG tablet Take 2 tablets (650 mg total) by mouth every 6 (six) hours as needed for mild pain (pain score 1-3) (or Fever >/= 101). 06/13/23   Rosendo Norleen BROCKS, MD  albuterol  (PROVENTIL ) (2.5 MG/3ML) 0.083% nebulizer solution Take 3 mLs (2.5 mg total) by nebulization every 6 (six) hours as needed for wheezing or shortness of breath. 06/13/23   Rosendo Norleen BROCKS, MD  albuterol  (VENTOLIN  HFA) 108 [phone] Base) MCG/ACT inhaler Inhale 2 puffs into the lungs every 6 (six) hours as needed for wheezing or shortness of breath. 09/25/22   Iva Marty Saltness, MD  amLODipine  (NORVASC ) 10 MG tablet Take 1 tablet (10 mg total) by mouth daily. 06/13/23   Rosendo Norleen BROCKS, MD  apixaban  (ELIQUIS ) 5 MG TABS tablet Take 2 tablets (10 mg total) by mouth 2 (two) times daily for 6 days, THEN 1 tablet (5 mg total) 2 (two) times daily for 23 days. 02/18/24 03/18/24  Kandis [name], DO  arformoterol  (BROVANA ) 15 MCG/2ML NEBU Take 2 mLs (15 mcg total) by nebulization 2 (two) times daily. 08/07/23   Kara Dorn NOVAK, MD  budesonide  (PULMICORT ) 0.5 MG/2ML nebulizer solution Take 2  mLs (0.5 mg total) by nebulization 2 (two) times daily. 08/07/23   Kara Dorn NOVAK, MD  camphor-menthol  [facility]) lotion Apply topically 2 (two) times daily. Patient taking differently: Apply 1 Application topically 2 (two) times daily. 12/03/22   Nicholas Bar, MD  carvedilol  (COREG ) 25 MG tablet Take 1 tablet (25 mg total) by mouth 2 (two) times daily with a meal. 05/24/22   Jolaine Pac, DO  diclofenac  Sodium (VOLTAREN ) 1 % GEL Apply 4 g topically 4 (four) times daily. 02/18/24   Kandis [name], DO  [Paused] hydrALAZINE  (APRESOLINE ) 50 MG tablet Take 1 tablet (50 mg total) by mouth 3 (three) times daily. Patient taking differently: Take 50 mg by mouth 3 (three) times daily. Patient out of medication 04/08/2023,  for about a month. Wait to take this until your doctor or other care provider tells you to start again. 02/19/23 10/18/23  Elnora Ip, MD  ipratropium (ATROVENT ) 0.03 % nasal spray Place 2 sprays into both nostrils every 12 (twelve) hours. 08/07/23   Dewald, Jonathan B, MD  multivitamin (RENA-VIT) TABS tablet Take 1 tablet by mouth at bedtime. 05/24/22   Jolaine Pac, DO  Nutritional Supplements (FEEDING SUPPLEMENT, NEPRO CARB STEADY,) LIQD Take 237 mLs by mouth as needed (missed meal during dialysis.). 02/18/24   Kandis [name], DO  pantoprazole  (PROTONIX ) 40 MG tablet Take 40 mg by mouth daily.    [provider]  polyethylene glycol powder (GLYCOLAX /MIRALAX ) 17 GM/SCOOP powder Mix as directed and take 1 capful (17 g) by mouth daily. Patient taking differently: Take 17 g by mouth daily as needed for mild constipation. 02/20/23   Elnora Ip, MD  revefenacin  (YUPELRI ) 175 MCG/3ML nebulizer solution Take 3 mLs (175 mcg total) by nebulization daily. 08/07/23   Kara Dorn NOVAK, MD  VITAMIN D  PO Take 1 tablet by mouth daily.    [provider]  insulin  aspart protamine  - aspart (NOVOLOG  MIX 70/30 FLEXPEN) (70-30) 100 UNIT/ML FlexPen Inject 15-30 Units  into the skin 2 (two) times daily.  05/24/22  [provider]    Scheduled Meds:  Continuous Infusions:  PRN Meds:   Allergies:   Allergies[1]  Social History:   Social History   Socioeconomic History   Marital status: Married    Spouse name: Not on file   Number of children: Not on file   Years of education: Not on file   Highest education level: Not on file  Occupational History   Not on file  Tobacco Use   Smoking status: Never    Passive exposure: Never   Smokeless tobacco: Never  Vaping Use   Vaping status: Never Used  Substance and Sexual Activity   Alcohol use: No   Drug use: No   Sexual activity: Not on file  Other Topics Concern   Not on file  Social History Narrative   Not on file   Social Drivers of Health   Tobacco Use: Low Risk (04/06/2024)   Patient History    Smoking Tobacco Use: Never    Smokeless Tobacco Use: Never    Passive Exposure: Never  Financial Resource Strain: Not on file  Food Insecurity: No Food Insecurity (02/17/2024)   Epic    Worried About Programme Researcher, Broadcasting/film/video in the Last Year: Never true    Ran Out of Food in the Last Year: Never true  Transportation Needs: No Transportation Needs (02/17/2024)   Epic    Lack of Transportation (Medical): No    Lack of Transportation (Non-Medical): No  Physical Activity: Not on file  Stress: Not on file  Social Connections: Unknown (08/22/2021)   [facility]   Social Network    Social Network: Not on file  Intimate Partner Violence: Not At Risk (02/17/2024)   Epic    Fear of Current or Ex-Partner: No    Emotionally Abused: No    Physically Abused: No    Sexually Abused: No  Depression (PHQ2-9): Not on file  Alcohol Screen: Not on file  Housing: Low Risk (02/17/2024)   Epic    Unable to Pay for Housing in the Last Year: No    Number of Times Moved in the Last Year: 0    Homeless in the Last Year: No  Utilities: Not  At Risk (02/17/2024)   Epic    Threatened  with loss of utilities: No  Health Literacy: Not on file    Family History:    Family History  Problem Relation Age of Onset   Hypertension Mother    Lung cancer Father      ROS:  Please see the history of present illness.  \ All other ROS reviewed and negative.     Physical Exam/Data: Vitals:   04/06/24 1440 04/06/24 1741  BP: (!) 183/104 (!) 161/105  Pulse: 91 83  Resp: (!) 25 18  Temp: 98.3 F (36.8 C) 98.1 F (36.7 C)  TempSrc: Oral   SpO2: 95% 96%   No intake or output data in the 24 hours ending 04/06/24 1955    02/18/2024   12:25 PM 02/18/2024   11:00 AM 02/17/2024    8:00 PM  Last 3 Weights  Weight (lbs) 244 lb 14.9 oz 240 lb 4.8 oz 243 lb 2.7 oz  Weight (kg) 111.1 kg 109 kg 110.3 kg     There is no height or weight on file to calculate BMI.  General:  Well nourished, well developed, in no acute distress morbid obesity.  Falls asleep every few seconds during the conversation. HEENT: normal Neck: no JVD Vascular: No carotid bruits; Distal pulses 2+ bilaterally Cardiac:  normal S1, S2; RRR; no murmur .  Left upper extremity AV fistula with excellent thrill and bruit. Lungs:  clear to auscultation bilaterally, no wheezing, rhonchi or rales  Abd: soft, nontender, no hepatomegaly  Ext: no edema Musculoskeletal:  No deformities, BUE and BLE strength normal and equal Skin: warm and dry  Neuro:  CNs 2-12 intact, no focal abnormalities noted Psych:  Normal affect   EKG:  The EKG was personally reviewed and demonstrates:  normal sinus rhythm without ischemic change Telemetry:  Telemetry was personally reviewed and demonstrates: Sinus rhythm  Relevant CV Studies: Echocardiogram 06/11/2023  1. Left ventricular ejection fraction, by estimation, is 60 to 65%. The  left ventricle has normal function. The left ventricle has no regional  wall motion abnormalities. There is severe concentric left ventricular  hypertrophy. Left ventricular diastolic   parameters are  consistent with Grade II diastolic dysfunction  (pseudonormalization). The average left ventricular global longitudinal  strain is -19.4 %. The global longitudinal strain is normal.   2. Right ventricular systolic function is normal. The right ventricular  size is mildly enlarged. There is normal pulmonary artery systolic  pressure. The estimated right ventricular systolic pressure is 33.5 mmHg.   3. Left atrial size was mildly dilated.   4. The mitral valve is degenerative. Mild mitral valve regurgitation. No  evidence of mitral stenosis. Moderate mitral annular calcification.   5. The aortic valve is tricuspid. Aortic valve regurgitation is not  visualized. No aortic stenosis is present.   6. There is mild dilatation of the ascending aorta, measuring 41 mm.   7. The inferior vena cava is normal in size with greater than 50%  respiratory variability, suggesting right atrial pressure of 3 mmHg.   Nuclear SPECT test 10/01/2023 [facility] 1.) LIMITATIONS: None.  2.) MYOCARDIAL PERFUSION: Medium-sized infarct in the circumflex/OM territory.  3.) LEFT VENTRICULAR EJECTION FRACTION: Normal.  4.) REGIONAL WALL MOTION: Hypokinesis of the inferolateral wall.    Cardiac catheterization 12/07/2020 [facility] Coronary Angiography Findings  --LM  Normal  --LAD  Minimal luminal irregularities  --LCx  Minimal luminal irregularities  --RCA  Minimal luminal irregularities  --LVEDP  15 mmHg   Comments  LCP via right radial access for positive stress test. Coronary  angiogram showed widely patent coronaries. AV crossed, LVEDP 15.     Laboratory Data: High Sensitivity Troponin:  No results for input(s): TROPONINIHS in the last 720 hours.  Recent Labs  Lab 04/06/24 1547 04/06/24 1828  TRNPT 208* 201*      Chemistry Recent Labs  Lab 04/06/24 1547 04/06/24 1648  NA 136 134*  K 4.4 4.1  CL 91*  --   CO2 29  --   GLUCOSE 170*  --   BUN 43*  --   CREATININE 8.51*  --   CALCIUM  9.5  --    GFRNONAA 7*  --   ANIONGAP 16*  --     No results for input(s): PROT, ALBUMIN, AST, ALT, ALKPHOS, BILITOT in the last 168 hours. Lipids No results for input(s): CHOL, TRIG, HDL, LABVLDL, LDLCALC, CHOLHDL in the last 168 hours.  Hematology Recent Labs  Lab 04/06/24 1547 04/06/24 1648  WBC 9.7  --   RBC 3.54*  --   HGB 11.1* 11.6*  HCT 33.5* 34.0*  MCV 94.6  --   MCH 31.4  --   MCHC 33.1  --   RDW 13.6  --   PLT 410*  --    Thyroid No results for input(s): TSH, FREET4 in the last 168 hours.  BNPNo results for input(s): BNP, PROBNP in the last 168 hours.  DDimer No results for input(s): DDIMER in the last 168 hours.  Radiology/Studies:  DG Chest 2 View Result Date: 04/06/2024 CLINICAL DATA:  Chest pain for 2-3 days. EXAM: CHEST - 2 VIEW COMPARISON:  02/17/2024 FINDINGS: Cardiomegaly is stable. Unchanged mediastinal contours with aortic atherosclerosis. Peribronchial thickening without focal airspace disease. Trace fluid in the fissures, no sub pulmonic effusion. No pneumothorax. Left chest wall loop recorder. Left subclavian stent. On limited assessment, no acute osseous findings. IMPRESSION: Cardiomegaly. Peribronchial thickening may be bronchitic or congestive. Electronically Signed   By: Andrea Gasman M.D.   On: 04/06/2024 19:08     Assessment and Plan: Acute on chronic HFpEF: I suspect that he has both left ventricular diastolic dysfunction due to severe LVH in the setting of poorly treated hypertension as well as right heart failure due to untreated obstructive sleep apnea and possibly also obesity hypoventilation syndrome.  It seems to me that pulmonary hypertension may be the dominant cause of his dyspnea and that right heart failure is the bigger problem with limitation in cardiac output due to RV failure as the reason for his difficulty with hemodialysis.  Will start with an echocardiogram.  It is possible we may need to perform a right  heart catheterization to fully characterize his hemodynamics. Exertional chest pain: He did not have any obstructive lesions on cardiac catheterization in 2022 and did not have any reversible ischemia on nuclear stress test performed just a few months ago.  I suspect that his exertional chest pain may be due to pulmonary hypertension, could also be due to microvascular dysfunction.  The minor elevation in troponin T is adynamic and does not represent a true acute coronary syndrome.  Note that he has had chronically elevated high-sensitivity troponin I for years.  The degree of elevation in troponin T appears to be more significant, but anecdotally have noticed that on multiple patients after we transitioned to the new labs. OSA: Appears to be severe just by the brief interaction with him in the ED today.  His  family confirms these observations.  He is not a candidate for a dental device or inspire implant due to his weight.  Without compliance with CPAP is likely he will develop progressively worsening pulmonary hypertension and cor pulmonale. ESRD: On dialysis MWF, his nephrologist is Dr. Norine.  Appears to have a well working fistula after 2 previously placed stents.  See the results of the AV fistulogram performed by Dr. Sheree 03/12/2024.  He is on apixaban  for DVT. Paroxysmal atrial fibrillation: Reportedly diagnosed during a hospitalization at [facility] a year ago.  He now has an implantable loop recorder and over the last roughly 6 months there has been no documentation of atrial fibrillation. HTN: He has severe left ventricular hypertrophy due to this and his blood pressure is quite high right now.  I am not sure that he is compliant with his medications.  Reportedly he is taking carvedilol  25 mg twice daily, hydralazine  50 mg 3 times daily and amlodipine  10 mg daily.  Would restart these medications and monitor carefully before making any additional changes. DM: Most recent hemoglobin A1c was close to  acceptable range at 7.1%.  The most recent lipid profile I been able to find was from 2017 and all parameters were in target range.   Risk Assessment/Risk Scores:       [place]  Heart Association (NYHA) Functional Class NYHA Class IV       For questions or updates, please contact [facility] Please consult [email or web address] for contact info under      Signed, Jerel Balding, MD  04/06/2024 7:55 PM      [1]  Allergies Allergen Reactions   Hydrocodone  Itching   Oxycodone  Itching   "

## 2024-04-06 NOTE — ED Notes (Signed)
 Vbg results to bowie pa by at

## 2024-04-06 NOTE — Progress Notes (Signed)
 Brief Nephrology Note  Informed of ESRD patient. Discussed with ER MD. Presents with acute on chronic CHF exacerbation. Patient dialyzes at [facility], 4hrs . EDW 103kg. AVF 15g, flow rates: 400/autoflow 1.5. 2k, 2cal. Heparin : 4000 units bolus. Meds: venofer  50mg  weekly. Of note, typically nonadherent with HD prescription and does not stay for full treatments. Will plan for HD overnight if possible otherwise tomorrow AM. Full consult to follow in AM. Please call with any questions/concerns in the interim.

## 2024-04-07 ENCOUNTER — Encounter (HOSPITAL_COMMUNITY): Payer: Self-pay | Admitting: Family Medicine

## 2024-04-07 ENCOUNTER — Observation Stay (HOSPITAL_COMMUNITY)

## 2024-04-07 DIAGNOSIS — I5033 Acute on chronic diastolic (congestive) heart failure: Secondary | ICD-10-CM | POA: Diagnosis not present

## 2024-04-07 LAB — RENAL FUNCTION PANEL
Albumin: 3.7 g/dL (ref 3.5–5.0)
Anion gap: 14 (ref 5–15)
BUN: 53 mg/dL — ABNORMAL HIGH (ref 6–20)
CO2: 29 mmol/L (ref 22–32)
Calcium: 9.5 mg/dL (ref 8.9–10.3)
Chloride: 92 mmol/L — ABNORMAL LOW (ref 98–111)
Creatinine, Ser: 10 mg/dL — ABNORMAL HIGH (ref 0.61–1.24)
GFR, Estimated: 6 mL/min — ABNORMAL LOW
Glucose, Bld: 197 mg/dL — ABNORMAL HIGH (ref 70–99)
Phosphorus: 8.6 mg/dL — ABNORMAL HIGH (ref 2.5–4.6)
Potassium: 4.4 mmol/L (ref 3.5–5.1)
Sodium: 135 mmol/L (ref 135–145)

## 2024-04-07 LAB — CBC
HCT: 31.1 % — ABNORMAL LOW (ref 39.0–52.0)
Hemoglobin: 10.1 g/dL — ABNORMAL LOW (ref 13.0–17.0)
MCH: 30.8 pg (ref 26.0–34.0)
MCHC: 32.5 g/dL (ref 30.0–36.0)
MCV: 94.8 fL (ref 80.0–100.0)
Platelets: 384 K/uL (ref 150–400)
RBC: 3.28 MIL/uL — ABNORMAL LOW (ref 4.22–5.81)
RDW: 13.6 % (ref 11.5–15.5)
WBC: 8.7 K/uL (ref 4.0–10.5)
nRBC: 0 % (ref 0.0–0.2)

## 2024-04-07 LAB — HEPATITIS B SURFACE ANTIGEN: Hepatitis B Surface Ag: NONREACTIVE

## 2024-04-07 LAB — CBG MONITORING, ED
Glucose-Capillary: 142 mg/dL — ABNORMAL HIGH (ref 70–99)
Glucose-Capillary: 188 mg/dL — ABNORMAL HIGH (ref 70–99)
Glucose-Capillary: 117 mg/dL — ABNORMAL HIGH (ref 70–99)

## 2024-04-07 MED ORDER — INSULIN ASPART 100 UNIT/ML IJ SOLN
0.0000 [IU] | Freq: Three times a day (TID) | INTRAMUSCULAR | Status: DC
  Administered 2024-04-07: 1 [IU] via SUBCUTANEOUS
  Administered 2024-04-08: 2 [IU] via SUBCUTANEOUS
  Administered 2024-04-09: 3 [IU] via SUBCUTANEOUS
  Administered 2024-04-09: 1 [IU] via SUBCUTANEOUS
  Administered 2024-04-10: 2 [IU] via SUBCUTANEOUS
  Filled 2024-04-07: qty 2
  Filled 2024-04-07 (×2): qty 1
  Filled 2024-04-07: qty 2
  Filled 2024-04-07: qty 3

## 2024-04-07 MED ORDER — HEPARIN SODIUM (PORCINE) 1000 UNIT/ML DIALYSIS: 4000.0000 [IU] | INTRAMUSCULAR | Status: DC | PRN

## 2024-04-07 MED ORDER — APIXABAN 5 MG PO TABS
5.0000 mg | ORAL_TABLET | Freq: Two times a day (BID) | ORAL | Status: DC
  Administered 2024-04-07 – 2024-04-10 (×9): 5 mg via ORAL
  Filled 2024-04-07 (×9): qty 1

## 2024-04-07 MED ORDER — BUDESONIDE 0.5 MG/2ML IN SUSP: 0.5000 mg | Freq: Two times a day (BID) | RESPIRATORY_TRACT | Status: DC

## 2024-04-07 MED ORDER — ALBUTEROL SULFATE (2.5 MG/3ML) 0.083% IN NEBU
2.5000 mg | INHALATION_SOLUTION | Freq: Four times a day (QID) | RESPIRATORY_TRACT | Status: DC | PRN
  Administered 2024-04-07: 2.5 mg via RESPIRATORY_TRACT
  Filled 2024-04-07: qty 3

## 2024-04-07 MED ORDER — ACETAMINOPHEN 650 MG RE SUPP: 650.0000 mg | Freq: Four times a day (QID) | RECTAL | Status: DC | PRN

## 2024-04-07 MED ORDER — ARFORMOTEROL TARTRATE 15 MCG/2ML IN NEBU: 15.0000 ug | INHALATION_SOLUTION | Freq: Two times a day (BID) | RESPIRATORY_TRACT | Status: DC

## 2024-04-07 MED ORDER — BUDESONIDE 0.5 MG/2ML IN SUSP
0.5000 mg | Freq: Two times a day (BID) | RESPIRATORY_TRACT | Status: DC
  Administered 2024-04-07 – 2024-04-11 (×8): 0.5 mg via RESPIRATORY_TRACT
  Filled 2024-04-07 (×8): qty 2

## 2024-04-07 MED ORDER — DM-GUAIFENESIN ER 30-600 MG PO TB12
1.0000 | ORAL_TABLET | Freq: Two times a day (BID) | ORAL | Status: DC
  Administered 2024-04-07 – 2024-04-10 (×8): 1 via ORAL
  Filled 2024-04-07 (×10): qty 1

## 2024-04-07 MED ORDER — PENTAFLUOROPROP-TETRAFLUOROETH EX AERO: 1.0000 | INHALATION_SPRAY | CUTANEOUS | Status: DC | PRN

## 2024-04-07 MED ORDER — HYDRALAZINE HCL 25 MG PO TABS
50.0000 mg | ORAL_TABLET | Freq: Three times a day (TID) | ORAL | Status: DC
  Administered 2024-04-07 – 2024-04-08 (×4): 50 mg via ORAL
  Filled 2024-04-07 (×2): qty 1
  Filled 2024-04-07: qty 2
  Filled 2024-04-07: qty 1

## 2024-04-07 MED ORDER — BENZONATATE 100 MG PO CAPS
100.0000 mg | ORAL_CAPSULE | Freq: Three times a day (TID) | ORAL | Status: DC | PRN
  Administered 2024-04-09: 100 mg via ORAL
  Filled 2024-04-07: qty 1

## 2024-04-07 MED ORDER — REVEFENACIN 175 MCG/3ML IN SOLN
175.0000 ug | Freq: Every day | RESPIRATORY_TRACT | Status: DC
  Administered 2024-04-07 – 2024-04-11 (×5): 175 ug via RESPIRATORY_TRACT
  Filled 2024-04-07 (×5): qty 3

## 2024-04-07 MED ORDER — INSULIN ASPART 100 UNIT/ML IJ SOLN: 0.0000 [IU] | Freq: Every day | INTRAMUSCULAR | Status: DC

## 2024-04-07 MED ORDER — FERRIC CITRATE 1 GM 210 MG(FE) PO TABS
630.0000 mg | ORAL_TABLET | Freq: Three times a day (TID) | ORAL | Status: DC
  Administered 2024-04-08 – 2024-04-10 (×6): 630 mg via ORAL
  Filled 2024-04-07 (×11): qty 3

## 2024-04-07 MED ORDER — HEPARIN SODIUM (PORCINE) 1000 UNIT/ML IJ SOLN
INTRAMUSCULAR | Status: AC
  Filled 2024-04-07: qty 4

## 2024-04-07 MED ORDER — CARVEDILOL 12.5 MG PO TABS
25.0000 mg | ORAL_TABLET | Freq: Two times a day (BID) | ORAL | Status: DC
  Administered 2024-04-07 – 2024-04-08 (×2): 25 mg via ORAL
  Filled 2024-04-07 (×2): qty 2

## 2024-04-07 MED ORDER — ACETAMINOPHEN 325 MG PO TABS
650.0000 mg | ORAL_TABLET | Freq: Four times a day (QID) | ORAL | Status: DC | PRN
  Administered 2024-04-07 – 2024-04-10 (×3): 650 mg via ORAL
  Filled 2024-04-07 (×3): qty 2

## 2024-04-07 MED ORDER — ARFORMOTEROL TARTRATE 15 MCG/2ML IN NEBU
15.0000 ug | INHALATION_SOLUTION | Freq: Two times a day (BID) | RESPIRATORY_TRACT | Status: DC
  Administered 2024-04-07 – 2024-04-11 (×7): 15 ug via RESPIRATORY_TRACT
  Filled 2024-04-07 (×9): qty 2

## 2024-04-07 MED ORDER — AMLODIPINE BESYLATE 5 MG PO TABS
10.0000 mg | ORAL_TABLET | Freq: Every day | ORAL | Status: DC
  Administered 2024-04-07 – 2024-04-08 (×2): 10 mg via ORAL
  Filled 2024-04-07 (×2): qty 2

## 2024-04-07 MED ORDER — SODIUM CHLORIDE 0.9% FLUSH
3.0000 mL | Freq: Two times a day (BID) | INTRAVENOUS | Status: DC
  Administered 2024-04-07 – 2024-04-10 (×8): 3 mL via INTRAVENOUS

## 2024-04-07 NOTE — Procedures (Signed)
 HD Note:  Some information was entered later than the data was gathered due to patient care needs. The stated time with the data is accurate.  Received patient in bed to unit.   Alert and oriented.   Informed consent signed and in chart.   Access used: Upper left arm fistula Access issues: None  Patient began cramping in the last hour of treatment.  Patient declined every offer from the staff and Dr. LITTIE Saba to turn the UF off.  Patient was reeducated that the cleaning of the blood would still continue. Cramping pain was severe at the end of the treatment.  300 ml was given back and the pain eased significantly. Patient leaving the unit exhausted and pain free.  TX duration: 3.5 hours  Alert, without acute distress.  Total UF removed: 3700 ml  Hand-off given to patient's nurse.   Transported back to the room   [place] Dann L. Lenon, RN [facility] Dialysis Unit.

## 2024-04-07 NOTE — Progress Notes (Signed)
"  °  Progress Note  Patient Name: [name] Date of Encounter: 04/07/2024 [facility] Cardiologist: Shelda Bruckner, MD   Interval Summary   Currently getting hemodialysis.  Vital Signs Vitals:   04/07/24 0755 04/07/24 0820 04/07/24 0830 04/07/24 0900  BP: (!) 154/100 (!) 157/47 (!) 144/94 (!) 145/88  Pulse: 87 88 86 86  Resp: (!) 24 (!) 22 18 17   Temp: 97.7 F (36.5 C)     TempSrc:      SpO2: 97% 100% 98% 99%  Weight: 108 kg      No intake or output data in the 24 hours ending 04/07/24 0911    04/07/2024    7:55 AM 02/18/2024   12:25 PM 02/18/2024   11:00 AM  Last 3 Weights  Weight (lbs) 238 lb 1.6 oz 244 lb 14.9 oz 240 lb 4.8 oz  Weight (kg) 108 kg 111.1 kg 109 kg      Telemetry/ECG  No adverse arrhythmias- Personally Reviewed  Echo 06/11/2023 with EF 65% grade 2 diastolic dysfunction mild mitral regurgitation pulmonary pressures 34 mmHg.  Heart catheterization 2022-LVEDP 15 mmHg with minimal CAD.  Physical Exam  GEN: No acute distress.   Neck: No JVD Cardiac: RRR, no murmurs, rubs, or gallops.  Respiratory: Clear to auscultation bilaterally. GI: Soft, nontender, non-distended  MS: No edema  Assessment & Plan   58 year old with end-stage renal disease on hemodialysis here with acute on chronic diastolic heart failure and exertional chest pain with concomitant diabetes hypertension and obstructive sleep apnea as well as paroxysmal atrial fibrillation with loop recorder not showing any evidence over the past 6 months.  Acute on chronic diastolic heart failure - Echocardiogram currently pending.  Could have a degree of RV failure with pulmonary hypertension driving his symptoms.  Continue with hemodialysis for fluid control.  Exertional chest pain - Back in 2022 had heart catheterization with minimal lesions.  Nuclear stress test a few months ago was unremarkable.  Reassuring.  May be microvascular or secondary to pulmonary  hypertension.  Elevated troponin - 208 flat.  Compatible with chronic myocardial injury in the setting of end-stage renal disease.  Chronic.  DVT - On apixaban .  OSA - Continue encourage compliance with CPAP.  Paroxysmal A-fib - Diagnosed during hospitalization at [facility] approximately a year ago.  Implantable loop recorder shows no further evidence at 6 months.  Diabetes with hypertension - Blood pressure has resulted in severe left ventricular hypertrophy.  Reportedly taking carvedilol  25 mg twice a day hydralazine  50 mg 3 times a day and amlodipine  10 mg a day at home.  For questions or updates, please contact [facility] Please consult [email or web address] for contact info under         Signed, Oneil Parchment, MD   "

## 2024-04-07 NOTE — Care Management Obs Status (Signed)
 MEDICARE OBSERVATION STATUS NOTIFICATION   Patient Details  Name: Norvell Caswell Cybulski MRN: [id number] Date of Birth: 08-09-1965   Medicare Observation Status Notification Given:  Yes    Merilee LOISE Batty, RN 04/07/2024, 5:09 PM

## 2024-04-07 NOTE — ED Notes (Signed)
 Report given to Nephrology nurse.

## 2024-04-07 NOTE — ED Notes (Signed)
 CCMD called.

## 2024-04-07 NOTE — Progress Notes (Signed)
" °  Echocardiogram 2D Echocardiogram unable to complete at this time due to patient being in dialysis  Tinnie FORBES Gosling RDCS 04/07/2024, 1:48 PM "

## 2024-04-07 NOTE — ED Provider Notes (Incomplete)
 " [place] [facility] Provider Note   HPI/ROS    History obtained from patient and family member.  [name] is a 58 y.o. male who presents for No chief complaint on file. and who  has a past medical history of Adjustment disorder with mixed anxiety and depressed mood (12/28/2014), Asthma, Bacteremia due to Pseudomonas (12/01/2022), CHF (congestive heart failure) (HCC), CPAP (continuous positive airway pressure) dependence, Diabetes mellitus without complication (HCC), DM (diabetes mellitus) type II controlled with renal manifestation (HCC) (12/28/2014), DM (diabetes mellitus) type II controlled, neurological manifestation (HCC) (12/28/2014), 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HCC), Encephalopathy, hypertensive (12/28/2014), ESRD on hemodialysis (HCC), GERD (gastroesophageal reflux disease), Hypertension, Hypertensive emergency without congestive heart failure (12/27/2014), Pleural effusion, Pneumonia, Possible Panic disorder (12/28/2014), 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  Patient presents today for shortness of breath.  States over the last few days he has had worsening shortness of breath at home and generalized bodyaches.  He thought he had the flu.  Has not been febrile at home or had vomiting or diarrhea.  States has had shortness of breath and a persistent cough over the last 2 years.  He never smoked and has no known history of COPD/emphysema.  States he had full dialysis session today, but per the family member he had to be put on oxygen during dialysis because his oxygen levels were low.  States that over the last several days every time he gets up and walks around his left side of his chest starts hurting and he feels short of breath.  When he sits down it feels a little bit better, but he still requires 4-5 pillows to sleep at night.  Does still have chest pain intermittently and shortness of breath while at rest also.  States he is unable to lie flat  almost ever.  Denies any change in his sputum, and just endorses plan clear sputum that has been there and his cough for roughly 2 to 3 years.  Denies any changes in medications or recent travel.  MDM   I have reviewed the nursing documentation, vital signs, as well as the past medical history, surgical history, family history, and social history.  Initial Assessment:  Patient hemodynamically stable on initial evaluation.  Was satting 100% on liters nasal cannula on initial evaluation.  Oxygen shut off and patient continued to sat above 95%.  Scattered rhonchi and exam consistent with volume overload and patient also has some edema peripherally.  Does not appear grossly volume overloaded in the extremities though and received a full dialysis session today.  Labs obtained in first look with stable chronic anemia likely secondary to anemia of chronic disease, no significant leukocytosis.  Mild thrombocytosis of unclear etiology, could be reactive.  BMP with mild hypochloremia with stable baseline creatinine.  Anion gap likely secondary to elevated BUN.  VBG here with mild respiratory alkalosis without signs of significant bicarb retention or hypoxia.  Initial troponin here very elevated from prior at 208.  Only had had troponin I in the past, but has had only been in the 30s.  Chest x-ray here with large cardiac silhouette with  bilateral fluffy traits consistent with pulmonary edema.  No obvious focal airspace disease, no subdiaphragmatic air, or pneumothorax.  Agree with radiology.  EKG here Sinus rhythm with no obvious ischemia, dysrhythmia, or high-grade AV block.    Disposition:  {ED Dispo:29898}    This patient was staffed with Dr. PIERRETTE who supervised the visit and agreed with  the plan of care.   Due to the patients current presenting symptoms, physical exam findings, and the workup stated above, it is thought that the etiology of the patients current presentation is: No diagnosis  found.   Clinical Complexity A medically appropriate history, review of systems, and physical exam was performed.  Factors that affect the complexity of this encounter: {DATA REVIEWED HX MDM AMOUNT/COMPLEXITY LIMITED:20770::assessment of correct protocol,laboratory work from this visit,notes from other physicians (***),review of echocardiogram/EKG results}  My independent interpretations of diagnostic studies are documented in the ED course above.   If decision rules were used in this patient's evaluation, they are listed below.  *** Click here for ABCD2, HEART and other calculatorsREFRESH Note before signing   Patient's presentation is most consistent with {EM COPA:27473}  MDM generated using voice dictation software and may contain dictation errors. Please contact me for any clarification or with any questions.    Physical Exam, PMH, PSH, Family History, and Social Hsitory   Vitals:   04/06/24 1440 04/06/24 1741  BP: (!) 183/104 (!) 161/105  Pulse: 91 83  Resp: (!) 25 18  Temp: 98.3 F (36.8 C) 98.1 F (36.7 C)  TempSrc: Oral   SpO2: 95% 96%    Physical Exam Constitutional:      Appearance: He is obese.  HENT:     Mouth/Throat:     Mouth: Mucous membranes are moist.     Pharynx: Oropharynx is clear.  Eyes:     Extraocular Movements: Extraocular movements intact.     Conjunctiva/sclera: Conjunctivae normal.  Cardiovascular:     Rate and Rhythm: Normal rate and regular rhythm.  Pulmonary:     Effort: Pulmonary effort is normal.     Breath sounds: Rhonchi (scattered) present.  Abdominal:     Palpations: Abdomen is soft.     Tenderness: There is no abdominal tenderness. There is no guarding or rebound.  Musculoskeletal:     Right lower leg: Edema (trace) present.     Left lower leg: Edema (trace) present.  Skin:    General: Skin is warm and dry.     Capillary Refill: Capillary refill takes less than 2 seconds.  Neurological:     General: No focal deficit  present.     Mental Status: He is alert and oriented to person, place, and time.     Past Medical History:  Diagnosis Date   Adjustment disorder with mixed anxiety and depressed mood 12/28/2014   Asthma    Bacteremia due to Pseudomonas 12/01/2022   CHF (congestive heart failure) (HCC)    CPAP (continuous positive airway pressure) dependence    Diabetes mellitus without complication (HCC)    DM (diabetes mellitus) type II controlled with renal manifestation (HCC) 12/28/2014   DM (diabetes mellitus) type II controlled, neurological manifestation (HCC) 12/28/2014   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HCC)    Encephalopathy, hypertensive 12/28/2014   ESRD on hemodialysis (HCC)    MWF at [facility]   GERD (gastroesophageal reflux disease)    Hypertension    Hypertensive emergency without congestive heart failure 12/27/2014   Pleural effusion    Pneumonia    Possible Panic disorder 12/28/2014   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   Sleep apnea      Past Surgical History:  Procedure Laterality Date   A/V FISTULAGRAM N/A 05/23/2023   Procedure: A/V Fistulagram;  Surgeon: Norine Manuelita LABOR, MD;  Location: [facility];  Service: Cardiovascular;  Laterality: N/A;   A/V FISTULAGRAM Left 02/05/2024  Procedure: A/V Fistulagram;  Surgeon: Pearline Norman RAMAN, MD;  Location: [facility] PV LAB;  Service: Cardiovascular;  Laterality: Left;   A/V FISTULAGRAM Left 03/12/2024   Procedure: A/V Fistulagram;  Surgeon: Sheree Penne Bruckner, MD;  Location: [facility];  Service: Cardiovascular;  Laterality: Left;   A/V SHUNT INTERVENTION Left 11/14/2023   Procedure: A/V SHUNT INTERVENTION;  Surgeon: Serene Gaile ORN, MD;  Location: [facility];  Service: Cardiovascular;  Laterality: Left;   AV FISTULA PLACEMENT Left 01/05/2020   Procedure: LEFT ARM BRACHIOCEPHALIC ARTERIOVENOUS (AV) FISTULA;  Surgeon: Eliza Bruckner RAMAN, MD;  Location: [facility] OR;  Service: Vascular;  Laterality: Left;   EMPYEMA  DRAINAGE Right 05/17/2017   Procedure: EMPYEMA DRAINAGE AND DECORTICATION;  Surgeon: Army Dallas NOVAK, MD;  Location: [facility] OR;  Service: Thoracic;  Laterality: Right;   FLEXIBLE BRONCHOSCOPY  05/17/2017   Procedure: FLEXIBLE BRONCHOSCOPY;  Surgeon: Army Dallas NOVAK, MD;  Location: [facility] OR;  Service: Thoracic;;   INSERTION OF DIALYSIS CATHETER Right 12/12/2021   Procedure: INSERTION OF RIGHT INTERNAL JUGULAR DIALYSIS CATHETER;  Surgeon: Eliza Bruckner RAMAN, MD;  Location: [facility] OR;  Service: Vascular;  Laterality: Right;   INSERTION OF DIALYSIS CATHETER N/A 11/27/2023   Procedure: INSERTION OF DIALYSIS CATHETER;  Surgeon: Serene Gaile ORN, MD;  Location: [facility] OR;  Service: Vascular;  Laterality: N/A;   IR FLUORO GUIDE CV LINE RIGHT  01/01/2020   IR REMOVAL TUN CV CATH W/O FL  01/14/2024   IR THORACENTESIS ASP PLEURAL SPACE W/IMG GUIDE  05/17/2017   IR US  GUIDE VASC ACCESS RIGHT  01/01/2020   PERIPHERAL VASCULAR BALLOON ANGIOPLASTY  05/23/2023   Procedure: PERIPHERAL VASCULAR BALLOON ANGIOPLASTY;  Surgeon: Norine Manuelita LABOR, MD;  Location: [facility];  Service: Cardiovascular;;   REVISON OF ARTERIOVENOUS FISTULA Left 12/12/2021   Procedure: REVISION OF LEFT ARM FISTULA BY PLICATION;  Surgeon: Eliza Bruckner RAMAN, MD;  Location: [facility] OR;  Service: Vascular;  Laterality: Left;   REVISON OF ARTERIOVENOUS FISTULA Left 11/27/2023   Procedure: Aneurysm plication of left arm;  Surgeon: Serene Gaile ORN, MD;  Location: [facility] OR;  Service: Vascular;  Laterality: Left;   THORACOTOMY/LOBECTOMY Right 05/17/2017   Procedure: RIGHT MINI THORACOTOMY;  Surgeon: Army Dallas NOVAK, MD;  Location: [facility] OR;  Service: Thoracic;  Laterality: Right;   VENOUS ANGIOPLASTY  11/14/2023   Procedure: VENOUS ANGIOPLASTY;  Surgeon: Serene Gaile ORN, MD;  Location: [facility] LAB;  Service: Cardiovascular;;   VENOUS ANGIOPLASTY  02/05/2024   Procedure: VENOUS ANGIOPLASTY;  Surgeon: Pearline Norman RAMAN, MD;  Location: [facility]  PV LAB;  Service: Cardiovascular;;   VENOUS STENT  02/05/2024   Procedure: VENOUS STENT;  Surgeon: Pearline Norman RAMAN, MD;  Location: [facility] LAB;  Service: Cardiovascular;;  85%cephalic arch   VIDEO ASSISTED THORACOSCOPY (VATS)/EMPYEMA Right 05/17/2017   Procedure: RIGHT VIDEO ASSISTED THORACOSCOPY (VATS)/EMPYEMA;  Surgeon: Army Dallas NOVAK, MD;  Location: [facility] OR;  Service: Thoracic;  Laterality: Right;     Family History  Problem Relation Age of Onset   Hypertension Mother    Lung cancer Father     Social History   Tobacco Use   Smoking status: Never    Passive exposure: Never   Smokeless tobacco: Never  Substance Use Topics   Alcohol use: No     Procedures   If procedures were preformed on this patient, they are listed below:  Procedures   Electronically signed by:   Glendia Carlin Ancona, M.D. PGY-2, Emergency Medicine   Please note  that this documentation was produced with the assistance of voice-to-text technology and may contain errors.  "

## 2024-04-07 NOTE — Consult Note (Addendum)
 [facility] Renal Consultation Note    Indication for Consultation:  Management of ESRD/hemodialysis, anemia, hypertension/volume, and secondary hyperparathyroidism.  HPI: [name] is a 58 y.o. male with ESRD, T2DM, HTN, HFpEF who was admitted with CP and dyspnea.  Presented to ED on 12/29 from dialysis with ongoing dyspnea and CP for the past 3 days. He did complete his dialysis prior to coming to ED, did not reach EDW. Also had endorsed cough, but no fever or chills. No abd pain, N/V/D. Intake vitals showed HTN but not with fever or hypoxia. Labs with Na 134, K 4.1, CO2 29, BUN 43, Trop 201, WBC 9.7, Hgb 11.6, Plts 410. Flu/COVID negative. CXR with pulm congestion. Cardiology was consulted for chest pain. Echo pending. There is mention of R/L HC in near future.  Our team was consulted for additional dialysis/volume removal. Seen in HD unit this AM - 4L UFG and tolerating.   Dialyzes on MWF schedule at [facility]. As above, he had full HD yesterday. Spoke with his clinic - RN reports that he chronically gains too much weight between treatments.  Past Medical History:  Diagnosis Date   Adjustment disorder with mixed anxiety and depressed mood 12/28/2014   Asthma    Bacteremia due to Pseudomonas 12/01/2022   CHF (congestive heart failure) (HCC)    CPAP (continuous positive airway pressure) dependence    Diabetes mellitus without complication (HCC)    DM (diabetes mellitus) type II controlled with renal manifestation (HCC) 12/28/2014   DM (diabetes mellitus) type II controlled, neurological manifestation (HCC) 12/28/2014   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HCC)    Encephalopathy, hypertensive 12/28/2014   ESRD on hemodialysis (HCC)    MWF at [facility] / [facility]   GERD (gastroesophageal reflux disease)    Hypertension    Hypertensive emergency without congestive heart failure 12/27/2014   Pleural effusion    Pneumonia    Possible Panic disorder 12/28/2014   Resistant hypertension  03/28/2011   Sleep apnea    Past Surgical History:  Procedure Laterality Date   A/V FISTULAGRAM N/A 05/23/2023   Procedure: A/V Fistulagram;  Surgeon: Norine Manuelita LABOR, MD;  Location: [facility] INV[facility] CV LAB;  Service: Cardiovascular;  Laterality: N/A;   A/V FISTULAGRAM Left 02/05/2024   Procedure: A/V Fistulagram;  Surgeon: Pearline Norman RAMAN, MD;  Location: [facility] PV LAB;  Service: Cardiovascular;  Laterality: Left;   A/V FISTULAGRAM Left 03/12/2024   Procedure: A/V Fistulagram;  Surgeon: Sheree Penne Bruckner, MD;  Location: [facility] PV LAB;  Service: Cardiovascular;  Laterality: Left;   A/V SHUNT INTERVENTION Left 11/14/2023   Procedure: A/V SHUNT INTERVENTION;  Surgeon: Serene Gaile ORN, MD;  Location: [facility] PV LAB;  Service: Cardiovascular;  Laterality: Left;   AV FISTULA PLACEMENT Left 01/05/2020   Procedure: LEFT ARM BRACHIOCEPHALIC ARTERIOVENOUS (AV) FISTULA;  Surgeon: Eliza Bruckner RAMAN, MD;  Location[facility] OR;  Service: Vascular;  Laterality: Left;   EMPYEMA DRAINAGE Right 05/17/2017   Procedure: EMPYEMA DRAINAGE AND DECORTICATION;  Surgeon: Army Dallas NOVAK, MD;  Location: [facility] OR;  Service: Thoracic;  Laterality: Right;   FLEXIBLE BRONCHOSCOPY  05/17/2017   Procedure: FLEXIBLE BRONCHOSCOPY;  Surgeon: Army Dallas NOVAK, MD;  Location: [facility] OR;  Service: Thoracic;;   INSERTION OF DIALYSIS CATHETER Right 12/12/2021   Procedure: INSERTION OF RIGHT INTERNAL JUGULAR DIALYSIS CATHETER;  Surgeon: Eliza Bruckner RAMAN, MD;  Location: [facility] OR;  Service: Vascular;  Laterality: Right;   INSERTION OF DIALYSIS CATHETER N/A 11/27/2023   Procedure: INSERTION OF DIALYSIS  CATHETER;  Surgeon: Serene Gaile ORN, MD;  Location: [facility] OR;  Service: Vascular;  Laterality: N/A;   IR FLUORO GUIDE CV LINE RIGHT  01/01/2020   IR REMOVAL TUN CV CATH W/O FL  01/14/2024   IR THORACENTESIS ASP PLEURAL SPACE W/IMG GUIDE  05/17/2017   IR US  GUIDE VASC ACCESS RIGHT  01/01/2020   PERIPHERAL VASCULAR BALLOON ANGIOPLASTY   05/23/2023   Procedure: PERIPHERAL VASCULAR BALLOON ANGIOPLASTY;  Surgeon: Norine Manuelita LABOR, MD;  [facility]: [facility];  Service: Cardiovascular;;   REVISON OF ARTERIOVENOUS FISTULA Left 12/12/2021   Procedure: REVISION OF LEFT ARM FISTULA BY PLICATION;  Surgeon: Eliza Lonni RAMAN, MD;  Location: [facility] OR;  Service: Vascular;  Laterality: Left;   REVISON OF ARTERIOVENOUS FISTULA Left 11/27/2023   Procedure: Aneurysm plication of left arm;  Surgeon: Serene Gaile ORN, MD;  Location: [facility] OR;  Service: Vascular;  Laterality: Left;   THORACOTOMY/LOBECTOMY Right 05/17/2017   Procedure: RIGHT MINI THORACOTOMY;  Surgeon: Army Dallas NOVAK, MD;  Location: [facility] OR;  Service: Thoracic;  Laterality: Right;   VENOUS ANGIOPLASTY  11/14/2023   Procedure: VENOUS ANGIOPLASTY;  Surgeon: Serene Gaile ORN, MD;  Location: [facility] PV LAB;  Service: Cardiovascular;;   VENOUS ANGIOPLASTY  02/05/2024   Procedure: VENOUS ANGIOPLASTY;  Surgeon: Pearline Norman RAMAN, MD;  Location: [facility] PV LAB;  Service: Cardiovascular;;   VENOUS STENT  02/05/2024   Procedure: VENOUS STENT;  Surgeon: Pearline Norman RAMAN, MD;  Location: [facility] PV LAB;  Service: Cardiovascular;;  85%cephalic arch   VIDEO ASSISTED THORACOSCOPY (VATS)/EMPYEMA Right 05/17/2017   Procedure: RIGHT VIDEO ASSISTED THORACOSCOPY (VATS)/EMPYEMA;  Surgeon: Army Dallas NOVAK, MD;  Location: [facility] OR;  Service: Thoracic;  Laterality: Right;   Family History  Problem Relation Age of Onset   Hypertension Mother    Lung cancer Father    Social History:  reports that he has never smoked. He has never been exposed to tobacco smoke. He has never used smokeless tobacco. He reports that he does not drink alcohol and does not use drugs.  ROS: As per HPI otherwise negative.  Physical Exam: Vitals:   04/07/24 0721 04/07/24 0755 04/07/24 0820 04/07/24 0830  BP:  (!) 154/100 (!) 157/47 (!) 144/94  Pulse:  87 88 86  Resp:  (!) 24 (!) 22 18  Temp: 97.6 F (36.4 C) 97.7 F (36.5 C)     TempSrc: Oral     SpO2:  97% 100% 98%  Weight:  108 kg       General: Well developed, well nourished, in no acute distress. [place] O2 in place Head: Normocephalic, atraumatic, sclera non-icteric, mucus membranes are moist. Neck: Supple without lymphadenopathy/masses. JVD not elevated. Lungs: Clear anteriorly, coarse lateral air movements Heart: RRR with normal S1, S2. No murmurs, rubs, or gallops appreciated. Abdomen: Soft, non-tender, non-distended with normoactive bowel sounds.  Musculoskeletal:  Strength and tone appear normal for age. Lower extremities: No edema or ischemic changes, no open wounds. Neuro: Alert and oriented X 3. Moves all extremities spontaneously. Psych:  Responds to questions appropriately with a normal affect. Dialysis Access: LUE AVF +t/b  Allergies[1] Prior to Admission medications  Medication Sig Start Date End Date Taking? Authorizing Provider  acetaminophen  (TYLENOL ) 325 MG tablet Take 2 tablets (650 mg total) by mouth every 6 (six) hours as needed for mild pain (pain score 1-3) (or Fever >/= 101). 06/13/23   Rosendo Norleen BROCKS, MD  albuterol  (PROVENTIL ) (2.5 MG/3ML) 0.083% nebulizer solution Take 3 mLs (2.5 mg total)  by nebulization every 6 (six) hours as needed for wheezing or shortness of breath. 06/13/23   Rosendo Norleen BROCKS, MD  albuterol  (VENTOLIN  HFA) [phone] Base) MCG/ACT inhaler Inhale 2 puffs into the lungs every 6 (six) hours as needed for wheezing or shortness of breath. 09/25/22   Iva Marty Saltness, MD  amLODipine  (NORVASC ) 10 MG tablet Take 1 tablet (10 mg total) by mouth daily. 06/13/23   Rosendo Norleen BROCKS, MD  apixaban  (ELIQUIS ) 5 MG TABS tablet Take 2 tablets (10 mg total) by mouth 2 (two) times daily for 6 days, THEN 1 tablet (5 mg total) 2 (two) times daily for 23 days. 02/18/24 03/18/24  Kandis [name], DO  arformoterol  (BROVANA ) 15 MCG/2ML NEBU Take 2 mLs (15 mcg total) by nebulization 2 (two) times daily. 08/07/23   Kara Dorn NOVAK, MD  budesonide   (PULMICORT ) 0.5 MG/2ML nebulizer solution Take 2 mLs (0.5 mg total) by nebulization 2 (two) times daily. 08/07/23   Kara Dorn NOVAK, MD  camphor-menthol  VIKKI) lotion Apply topically 2 (two) times daily. Patient taking differently: Apply 1 Application topically 2 (two) times daily. 12/03/22   Nicholas Bar, MD  carvedilol  (COREG ) 25 MG tablet Take 1 tablet (25 mg total) by mouth 2 (two) times daily with a meal. 05/24/22   Jolaine Pac, DO  diclofenac  Sodium (VOLTAREN ) 1 % GEL Apply 4 g topically 4 (four) times daily. 02/18/24   Kandis [name], DO  [Paused] hydrALAZINE  (APRESOLINE ) 50 MG tablet Take 1 tablet (50 mg total) by mouth 3 (three) times daily. Patient taking differently: Take 50 mg by mouth 3 (three) times daily. Patient out of medication 04/08/2023, for about a month. Wait to take this until your doctor or other care provider tells you to start again. 02/19/23 10/18/23  Elnora Ip, MD  ipratropium (ATROVENT ) 0.03 % nasal spray Place 2 sprays into both nostrils every 12 (twelve) hours. 08/07/23   Dewald, Jonathan B, MD  multivitamin (RENA-VIT) TABS tablet Take 1 tablet by mouth at bedtime. 05/24/22   Jolaine Pac, DO  Nutritional Supplements (FEEDING SUPPLEMENT, NEPRO CARB STEADY,) LIQD Take 237 mLs by mouth as needed (missed meal during dialysis.). 02/18/24   Kandis [name], DO  pantoprazole  (PROTONIX ) 40 MG tablet Take 40 mg by mouth daily.    [provider]  polyethylene glycol powder (GLYCOLAX /MIRALAX ) 17 GM/SCOOP powder Mix as directed and take 1 capful (17 g) by mouth daily. Patient taking differently: Take 17 g by mouth daily as needed for mild constipation. 02/20/23   Elnora Ip, MD  revefenacin  (YUPELRI ) 175 MCG/3ML nebulizer solution Take 3 mLs (175 mcg total) by nebulization daily. 08/07/23   Kara Dorn NOVAK, MD  VITAMIN D  PO Take 1 tablet by mouth daily.    [provider]  insulin  aspart protamine  - aspart (NOVOLOG  MIX 70/30  FLEXPEN) (70-30) 100 UNIT/ML FlexPen Inject 15-30 Units into the skin 2 (two) times daily.  05/24/22  [provider]   Current Facility-Administered Medications  Medication Dose Route Frequency Provider Last Rate Last Admin   acetaminophen  (TYLENOL ) tablet 650 mg  650 mg Oral Q6H PRN Bryn Bernardino NOVAK, MD       Or   acetaminophen  (TYLENOL ) suppository 650 mg  650 mg Rectal Q6H PRN Bryn Bernardino NOVAK, MD       albuterol  (PROVENTIL ) (2.5 MG/3ML) 0.083% nebulizer solution 2.5 mg  2.5 mg Nebulization Q6H PRN Bryn Bernardino NOVAK, MD   2.5 mg at 04/07/24 0529   amLODipine  (NORVASC ) tablet 10 mg  10  mg Oral Daily Bryn Bernardino NOVAK, MD       apixaban  (ELIQUIS ) tablet 5 mg  5 mg Oral BID Bryn Bernardino NOVAK, MD   5 mg at 04/07/24 0308   arformoterol  (BROVANA ) nebulizer solution 15 mcg  15 mcg Nebulization BID Bryn Bernardino NOVAK, MD       benzonatate  (TESSALON ) capsule 100 mg  100 mg Oral TID PRN Bryn Bernardino NOVAK, MD       budesonide  (PULMICORT ) nebulizer solution 0.5 mg  0.5 mg Nebulization BID Bryn Bernardino NOVAK, MD       carvedilol  (COREG ) tablet 25 mg  25 mg Oral BID WC Bryn Bernardino NOVAK, MD       Chlorhexidine  Gluconate Cloth 2 % PADS 6 each  6 each Topical Q0600 Dennise Hoes, MD       dextromethorphan -guaiFENesin  (MUCINEX  DM) 30-600 MG per 12 hr tablet 1 tablet  1 tablet Oral BID Bryn Bernardino NOVAK, MD   1 tablet at 04/07/24 0308   heparin  injection 4,000 Units  4,000 Units Dialysis PRN Dennise Hoes, MD       hydrALAZINE  (APRESOLINE ) tablet 50 mg  50 mg Oral TID Bryn Bernardino NOVAK, MD   50 mg at 04/07/24 9471   insulin  aspart (novoLOG ) injection 0-5 Units  0-5 Units Subcutaneous QHS Bryn Bernardino NOVAK, MD       insulin  aspart (novoLOG ) injection 0-6 Units  0-6 Units Subcutaneous TID WC Bryn Bernardino NOVAK, MD       pentafluoroprop-tetrafluoroeth (GEBAUERS) aerosol 1 Application  1 Application Topical PRN Dennise Hoes, MD       revefenacin  (YUPELRI ) nebulizer solution 175 mcg  175 mcg Nebulization Daily Bryn Bernardino NOVAK, MD       sodium chloride  flush  (NS) 0.9 % injection 3 mL  3 mL Intravenous Q12H Bryn Bernardino NOVAK, MD   3 mL at 04/07/24 0223   Labs: Basic Metabolic Panel: Recent Labs  Lab 04/06/24 1547 04/06/24 1648 04/07/24 0534  NA 136 134* 135  K 4.4 4.1 4.4  CL 91*  --  92*  CO2 29  --  29  GLUCOSE 170*  --  197*  BUN 43*  --  53*  CREATININE 8.51*  --  10.00*  CALCIUM  9.5  --  9.5  PHOS  --   --  8.6*   Liver Function Tests: Recent Labs  Lab 04/07/24 0534  ALBUMIN 3.7   CBC: Recent Labs  Lab 04/06/24 1547 04/06/24 1648 04/07/24 0534  WBC 9.7  --  8.7  HGB 11.1* 11.6* 10.1*  HCT 33.5* 34.0* 31.1*  MCV 94.6  --  94.8  PLT 410*  --  384   CBG: Recent Labs  Lab 04/07/24 0727  GLUCAP 142*   Studies/Results: DG Chest 2 View Result Date: 04/06/2024 CLINICAL DATA:  Chest pain for 2-3 days. EXAM: CHEST - 2 VIEW COMPARISON:  02/17/2024 FINDINGS: Cardiomegaly is stable. Unchanged mediastinal contours with aortic atherosclerosis. Peribronchial thickening without focal airspace disease. Trace fluid in the fissures, no sub pulmonic effusion. No pneumothorax. Left chest wall loop recorder. Left subclavian stent. On limited assessment, no acute osseous findings. IMPRESSION: Cardiomegaly. Peribronchial thickening may be bronchitic or congestive. Electronically Signed   By: Andrea Gasman M.D.   On: 04/06/2024 19:08   Dialysis Orders:  MWF - AF  (last HD 12/29, full HD - left at 107.4kg) 4:15hr, 400/A1.5, EDW 103kg, 2K/2Ca, AVF, 15g needles, Heparin  4000 unit bolus - Venofer  50mg  weekly - no ESA - VDRA recently held  due to ^ Ca (11.2 on 12/17)  Assessment/Plan:  Dyspnea: Progressive, extra HD with volume removal today.  Exertional CP: Cardiology involved  ESRD:  Usual MWF schedule. HD today, then back to usual schedule tomorrow.  Hypertension/volume: BP high, home meds resumed, UF as tolerated.  Anemia: Hgb 10.1, no ESA for now.  Metabolic bone disease: Ca ok, Phos high - resume home binders Dollie) T2DM   pA-fib: None recently per cardiology note.  Izetta Boehringer, PA-C 04/07/2024, 8:52 AM  [facility]   Seen and examined independently.  Agree with note and exam as documented above by physician extender and as noted here.  Mr. Tonche is a 58 year old gentleman with a history of ESRD on hemodialysis Monday Wednesday Friday at the [facility] who presented to the hospital with shortness of breath and chest pain.  He did get a full dialysis treatment yesterday.  Per his outpatient dialysis unit, he gains too much weight between treatments.  He also does not complete full treatments.  The patient states that it is hard for him to stay longer than 3 hours because he will start cramping and having chest discomfort.  We discussed importance of limiting gains to try and minimize high goals.  Cardiology has been consulted here.  General adult male in bed in no acute distress HEENT normocephalic atraumatic extraocular movements intact sclera anicteric Neck supple trachea midline Lungs clear to auscultation bilaterally normal work of breathing at rest on 2 liters oxygen  Heart S1S2 no rub Abdomen soft nontender nondistended Extremities trace edema lower extremities; no cyanosis or clubbing  Psych normal mood and affect Neuro alert and oriented x 3 provides hx and follows commands  Access LUE AVF in use  Seen and examined on dialysis.  Procedure supervised.  Blood pressure 118/91 and HR 90.  Tolerating goal.  LUE AVF in use.   Shortness of breath Optimize volume status with HD Encouraged patient to limit fluids Strict ins and outs are ordered and I have ordered daily weights  Exertional chest pain Cardiology consulted On carvedilol   End-stage renal disease HD today then resume usual Monday Wednesday Friday schedule tomorrow  Hypertension Continue home current regimen including beta-blocker Optimize volume status with HD  Anemia of CKD - Hb acceptable - defer ESA for now    Metabolic bone disease - resume home auryxia    Thank you for consult.  Please do not hesitate to contact me with any questions regarding our patient.   Katheryn JAYSON Saba, MD 04/07/2024  11:59 AM      [1]  Allergies Allergen Reactions   Hydrocodone  Itching   Oxycodone  Itching

## 2024-04-07 NOTE — Progress Notes (Signed)
 " PROGRESS NOTE    [name]  FMW:[id number] DOB: [birth year] DOA: 04/06/2024 PCP: Rik Glinda DASEN, FNP  Chief Complaint  Patient presents with   Shortness of Breath    Brief Narrative:   [name] is [name] 58 y.o. male with [place] history of ESRD on hemodialysis (MWF), HFpEF, HTN, T2DM, OSA not on CPAP, PAF, right internal jugular vein DVT, asthma, GERD who presented to the [facility] today with shortness of breath during dialysis. Per report, he was hypoxic at dialysis, briefly requiring supplemental oxygen, but was able to be weaned off prior to ED arrival.   Assessment & Plan:   Principal Problem:   Acute on chronic heart failure with preserved ejection fraction (HFpEF) (HCC) Active Problems:   AF (paroxysmal atrial fibrillation) (HCC)   ESRD on dialysis (HCC)   DMII (diabetes mellitus, type 2) (HCC)   Severe uncontrolled hypertension   Anxiety   Sleep apnea  ESRD  Volume Overload Acute on chronic HFpEF/biventricular failure - appreciate renal assistance, s/p dialysis today - volume per renal.  Apparently on transplant list.  - awaiting echo, appreciate cardiology assistance.  Considering RHC?  Previous echo with grade II diastolic dysfunction in 06/2023.   Chronic demand myocardial ischemia: Troponin elevated with flat trend despite ESRD and no ongoing chest complaints. Had no reversible abnormalities on recent stress test. Cardiology does not suspect ACS at this time.  - Continue anticoagulation, beta blocker, not on statin   HTN: BP significantly elevated. ?adherence to outpatient regimen.  - volume per renal - amlodipine , coreg , hydralazine    T2DM: Recent HbA1c 7.1%.  - Don't see any active orders for medications.  - SSI while hospitalized.  - No plan to start statin per outpatient cardiology.   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  - Continue eliquis , now down to 5mg  dose   PAF: None currently detected - Continue routine monitoring with ILR per Dr. Inocencio.    OSA: Nonadherent to CPAP.  - Counseling provided, declines this while inpatient.   Medication nonadherence: This has been mentioned in prior notes and dispense history shows many medications that were dispensed but never picked up by the patient.  - Will need to reinforce this going forward.     DVT prophylaxis: eliquis  Code Status: full Family Communication: none Disposition:   Status is: Observation The patient remains OBS appropriate and will d/c before 2 midnights.   Consultants:  Cardiology nephrology  Procedures:  none  Antimicrobials:  Anti-infectives (From admission, onward)    None       Subjective:  C/o generalized pain Notes issues with cramps/muscle aches after dialysis Breathing is ok  Objective: Vitals:   04/07/24 1608 04/07/24 1621 04/07/24 1623 04/07/24 1627  BP: (!) 136/94 (!) 148/91 (!) 148/91   Pulse: 92     Resp: 18     Temp: 98.2 F (36.8 C)     TempSrc: Oral     SpO2: 100%   100%  Weight:        Intake/Output Summary (Last 24 hours) at 04/07/2024 1739 Last data filed at 04/07/2024 1239 Gross per 24 hour  Intake --  Output 3700 ml  Net -3700 ml   Filed Weights   04/07/24 0755  Weight: 108 kg    Examination:  General exam: appears mildly uncomfortable Respiratory system: Clear to auscultation. Mild tachypnea Cardiovascular system: RRR Gastrointestinal system: Abdomen is nondistended, soft and nontender. Central nervous system: Alert and oriented. No focal neurological deficits. Extremities:  no LEE   Data Reviewed: I have personally reviewed following labs and imaging studies  CBC: Recent Labs  Lab 04/06/24 1547 04/06/24 1648 04/07/24 0534  WBC 9.7  --  8.7  HGB 11.1* 11.6* 10.1*  HCT 33.5* 34.0* 31.1*  MCV 94.6  --  94.8  PLT 410*  --  384    Basic Metabolic Panel: Recent Labs  Lab 04/06/24 1547 04/06/24 1648 04/07/24 0534  NA 136 134* 135  K 4.4 4.1 4.4  CL 91*  --  92*  CO2 29  --  29  GLUCOSE  170*  --  197*  BUN 43*  --  53*  CREATININE 8.51*  --  10.00*  CALCIUM  9.5  --  9.5  PHOS  --   --  8.6*    GFR: Estimated Creatinine Clearance: 9.6 mL/min (Lexis Potenza) (by C-G formula based on SCr of 10 mg/dL (H)).  Liver Function Tests: Recent Labs  Lab 04/07/24 0534  ALBUMIN 3.7    CBG: Recent Labs  Lab 04/07/24 0727  GLUCAP 142*     Recent Results (from the past 240 hours)  Resp panel by RT-PCR (RSV, Flu Elasha Tess&B, Covid) Anterior Nasal Swab     Status: None   Collection Time: 04/06/24  7:40 PM   Specimen: Anterior Nasal Swab  Result Value Ref Range Status   SARS Coronavirus 2 by RT PCR NEGATIVE NEGATIVE Final   Influenza Hazley Dezeeuw by PCR NEGATIVE NEGATIVE Final   Influenza B by PCR NEGATIVE NEGATIVE Final    Comment: (NOTE) The Xpert Xpress SARS-CoV-2/FLU/RSV plus assay is intended as an aid in the diagnosis of influenza from Nasopharyngeal swab specimens and should not be used as Kameria Canizares sole basis for treatment. Nasal washings and aspirates are unacceptable for Xpert Xpress SARS-CoV-2/FLU/RSV testing.  Fact Sheet for Patients: bloggercourse.com  Fact Sheet for Healthcare Providers: seriousbroker.it  This test is not yet approved or cleared by the [place]  FDA and has been authorized for detection and/or diagnosis of SARS-CoV-2 by FDA under an Emergency Use Authorization (EUA). This EUA will remain in effect (meaning this test can be used) for the duration of the COVID-19 declaration under Section 564(b)(1) of the Act, 21 U.S.C. section 360bbb-3(b)(1), unless the authorization is terminated or revoked.     Resp Syncytial Virus by PCR NEGATIVE NEGATIVE Final    Comment: (NOTE) Fact Sheet for Patients: bloggercourse.com  Fact Sheet for Healthcare Providers: seriousbroker.it  This test is not yet approved or cleared by the [place]  FDA and has been authorized for  detection and/or diagnosis of SARS-CoV-2 by FDA under an Emergency Use Authorization (EUA). This EUA will remain in effect (meaning this test can be used) for the duration of the COVID-19 declaration under Section 564(b)(1) of the Act, 21 U.S.C. section 360bbb-3(b)(1), unless the authorization is terminated or revoked.  Performed at [facility], [address], [place], [place] [address]          Radiology Studies: DG Chest 2 View Result Date: 04/06/2024 CLINICAL DATA:  Chest pain for 2-3 days. EXAM: CHEST - 2 VIEW COMPARISON:  02/17/2024 FINDINGS: Cardiomegaly is stable. Unchanged mediastinal contours with aortic atherosclerosis. Peribronchial thickening without focal airspace disease. Trace fluid in the fissures, no sub pulmonic effusion. No pneumothorax. Left chest wall loop recorder. Left subclavian stent. On limited assessment, no acute osseous findings. IMPRESSION: Cardiomegaly. Peribronchial thickening may be bronchitic or congestive. Electronically Signed   By: Andrea Gasman M.D.   On: 04/06/2024 19:08  Scheduled Meds:  amLODipine   10 mg Oral Daily   apixaban   5 mg Oral BID   arformoterol   15 mcg Nebulization BID   budesonide   0.5 mg Nebulization BID   carvedilol   25 mg Oral BID WC   Chlorhexidine  Gluconate Cloth  6 each Topical Q0600   dextromethorphan -guaiFENesin   1 tablet Oral BID   ferric citrate  630 mg Oral TID WC   hydrALAZINE   50 mg Oral TID   insulin  aspart  0-5 Units Subcutaneous QHS   insulin  aspart  0-6 Units Subcutaneous TID WC   revefenacin   175 mcg Nebulization Daily   sodium chloride  flush  3 mL Intravenous Q12H   Continuous Infusions:   LOS: 0 days    Time spent: over 30 min     [name] Monte, MD [facility]   To contact the attending provider between 7A-7P or the covering provider during after hours 7P-7A, please log into the web site [email or web address] and access using universal [place] password for that web  site. If you do not have the password, please call the hospital operator.  04/07/2024, 5:39 PM    "

## 2024-04-08 ENCOUNTER — Observation Stay (HOSPITAL_COMMUNITY)

## 2024-04-08 ENCOUNTER — Observation Stay (HOSPITAL_BASED_OUTPATIENT_CLINIC_OR_DEPARTMENT_OTHER)

## 2024-04-08 DIAGNOSIS — I5033 Acute on chronic diastolic (congestive) heart failure: Secondary | ICD-10-CM | POA: Diagnosis not present

## 2024-04-08 LAB — COMPREHENSIVE METABOLIC PANEL WITH GFR
ALT: 6 U/L (ref 0–44)
AST: 13 U/L — ABNORMAL LOW (ref 15–41)
Albumin: 4.1 g/dL (ref 3.5–5.0)
Alkaline Phosphatase: 126 U/L (ref 38–126)
Anion gap: 16 — ABNORMAL HIGH (ref 5–15)
BUN: 45 mg/dL — ABNORMAL HIGH (ref 6–20)
CO2: 29 mmol/L (ref 22–32)
Calcium: 10 mg/dL (ref 8.9–10.3)
Chloride: 90 mmol/L — ABNORMAL LOW (ref 98–111)
Creatinine, Ser: 8.66 mg/dL — ABNORMAL HIGH (ref 0.61–1.24)
GFR, Estimated: 7 mL/min — ABNORMAL LOW
Glucose, Bld: 165 mg/dL — ABNORMAL HIGH (ref 70–99)
Potassium: 5 mmol/L (ref 3.5–5.1)
Sodium: 135 mmol/L (ref 135–145)
Total Bilirubin: 0.4 mg/dL (ref 0.0–1.2)
Total Protein: 8.3 g/dL — ABNORMAL HIGH (ref 6.5–8.1)

## 2024-04-08 LAB — CBC WITH DIFFERENTIAL/PLATELET
Abs Immature Granulocytes: 0.03 K/uL (ref 0.00–0.07)
Basophils Absolute: 0.1 K/uL (ref 0.0–0.1)
Basophils Relative: 1 %
Eosinophils Absolute: 0.2 K/uL (ref 0.0–0.5)
Eosinophils Relative: 2 %
HCT: 37.6 % — ABNORMAL LOW (ref 39.0–52.0)
Hemoglobin: 12.1 g/dL — ABNORMAL LOW (ref 13.0–17.0)
Immature Granulocytes: 0 %
Lymphocytes Relative: 21 %
Lymphs Abs: 2.2 K/uL (ref 0.7–4.0)
MCH: 31 pg (ref 26.0–34.0)
MCHC: 32.2 g/dL (ref 30.0–36.0)
MCV: 96.4 fL (ref 80.0–100.0)
Monocytes Absolute: 0.7 K/uL (ref 0.1–1.0)
Monocytes Relative: 7 %
Neutro Abs: 7 K/uL (ref 1.7–7.7)
Neutrophils Relative %: 69 %
Platelets: 455 K/uL — ABNORMAL HIGH (ref 150–400)
RBC: 3.9 MIL/uL — ABNORMAL LOW (ref 4.22–5.81)
RDW: 13.6 % (ref 11.5–15.5)
WBC: 10.2 K/uL (ref 4.0–10.5)
nRBC: 0 % (ref 0.0–0.2)

## 2024-04-08 LAB — CBG MONITORING, ED
Glucose-Capillary: 154 mg/dL — ABNORMAL HIGH (ref 70–99)
Glucose-Capillary: 102 mg/dL — ABNORMAL HIGH (ref 70–99)
Glucose-Capillary: 144 mg/dL — ABNORMAL HIGH (ref 70–99)

## 2024-04-08 LAB — PHOSPHORUS: Phosphorus: 8.7 mg/dL — ABNORMAL HIGH (ref 2.5–4.6)

## 2024-04-08 LAB — MAGNESIUM: Magnesium: 2.8 mg/dL — ABNORMAL HIGH (ref 1.7–2.4)

## 2024-04-08 LAB — ECHOCARDIOGRAM COMPLETE
Area-P 1/2: 2.59 cm2
Height: 67 in
S' Lateral: 3.3 cm
Weight: 3809.55 [oz_av]

## 2024-04-08 LAB — HEPATITIS B SURFACE ANTIBODY, QUANTITATIVE: Hep B S AB Quant (Post): 944 m[IU]/mL

## 2024-04-08 MED ORDER — MIDODRINE HCL 5 MG PO TABS
ORAL_TABLET | ORAL | Status: AC
  Filled 2024-04-08: qty 2

## 2024-04-08 MED ORDER — MIDODRINE HCL 5 MG PO TABS
10.0000 mg | ORAL_TABLET | Freq: Once | ORAL | Status: AC
  Administered 2024-04-08: 10 mg via ORAL

## 2024-04-08 MED ORDER — HEPARIN SODIUM (PORCINE) 1000 UNIT/ML IJ SOLN
INTRAMUSCULAR | Status: AC
  Filled 2024-04-08: qty 4

## 2024-04-08 MED ORDER — HEPARIN SODIUM (PORCINE) 1000 UNIT/ML DIALYSIS
4000.0000 [IU] | Freq: Once | INTRAMUSCULAR | Status: AC
  Administered 2024-04-08: 4000 [IU] via INTRAVENOUS_CENTRAL

## 2024-04-08 MED ORDER — CARVEDILOL 12.5 MG PO TABS
12.5000 mg | ORAL_TABLET | Freq: Two times a day (BID) | ORAL | Status: DC
  Administered 2024-04-09 – 2024-04-10 (×3): 12.5 mg via ORAL
  Filled 2024-04-08 (×3): qty 1

## 2024-04-08 NOTE — ED Notes (Signed)
 PT would like to sleep at this time

## 2024-04-08 NOTE — ED Notes (Signed)
"  Patient transported to dialysis  "

## 2024-04-08 NOTE — ED Notes (Signed)
 All the wires were off on monitor bp cuff off  replaced

## 2024-04-08 NOTE — Progress Notes (Signed)
 2D echo attempted, patient going to dialysis.

## 2024-04-08 NOTE — Progress Notes (Addendum)
 " [facility] Progress Note   Subjective:   Seen on HD - BP very low to start and he feels bad/dizzy with headache. Got BP meds this AM. Not pulling any fluid to start - will give 200mL bolus and midodrine 10mg . Will hold amlodipine  and hydralazine  temporarily and put parameters on Coreg . Did have dialysis yesterday with 3.7L off but with severe cramping at the end. Plan was more UF today, unclear if will be able to remove fluid given hypotension.  Objective Vitals:   04/08/24 0821 04/08/24 0906 04/08/24 1022 04/08/24 1040  BP: 112/63  (!) 87/66 (!) 82/54  Pulse: 68  71 63  Resp: 14  15 16   Temp:  98.2 F (36.8 C) 97.7 F (36.5 C)   TempSrc:      SpO2: 100%   99%  Weight:   108 kg   Height:       Physical Exam General: Drowsy appearing man, NAD. Lamy O2 in place Heart: RRR; 2/6 murmur Lungs: CTA anteriorly Abdomen: soft Extremities: no LE edema Dialysis Access: LUE AVF +t/b  Additional Objective Labs: Basic Metabolic Panel: Recent Labs  Lab 04/06/24 1547 04/06/24 1648 04/07/24 0534 04/08/24 0817  NA 136 134* 135 135  K 4.4 4.1 4.4 5.0  CL 91*  --  92* 90*  CO2 29  --  29 29  GLUCOSE 170*  --  197* 165*  BUN 43*  --  53* 45*  CREATININE 8.51*  --  10.00* 8.66*  CALCIUM  9.5  --  9.5 10.0  PHOS  --   --  8.6* 8.7*   Liver Function Tests: Recent Labs  Lab 04/07/24 0534 04/08/24 0817  AST  --  13*  ALT  --  6  ALKPHOS  --  126  BILITOT  --  0.4  PROT  --  8.3*  ALBUMIN 3.7 4.1   CBC: Recent Labs  Lab 04/06/24 1547 04/06/24 1648 04/07/24 0534 04/08/24 0817  WBC 9.7  --  8.7 10.2  NEUTROABS  --   --   --  7.0  HGB 11.1* 11.6* 10.1* 12.1*  HCT 33.5* 34.0* 31.1* 37.6*  MCV 94.6  --  94.8 96.4  PLT 410*  --  384 455*   Studies/Results: DG Chest 2 View Result Date: 04/06/2024 CLINICAL DATA:  Chest pain for 2-3 days. EXAM: CHEST - 2 VIEW COMPARISON:  02/17/2024 FINDINGS: Cardiomegaly is stable. Unchanged mediastinal contours with aortic  atherosclerosis. Peribronchial thickening without focal airspace disease. Trace fluid in the fissures, no sub pulmonic effusion. No pneumothorax. Left chest wall loop recorder. Left subclavian stent. On limited assessment, no acute osseous findings. IMPRESSION: Cardiomegaly. Peribronchial thickening may be bronchitic or congestive. Electronically Signed   By: Andrea Gasman M.D.   On: 04/06/2024 19:08   Medications:   apixaban   5 mg Oral BID   arformoterol   15 mcg Nebulization BID   budesonide   0.5 mg Nebulization BID   carvedilol   25 mg Oral BID WC   Chlorhexidine  Gluconate Cloth  6 each Topical Q0600   dextromethorphan -guaiFENesin   1 tablet Oral BID   ferric citrate  630 mg Oral TID WC   insulin  aspart  0-5 Units Subcutaneous QHS   insulin  aspart  0-6 Units Subcutaneous TID WC   revefenacin   175 mcg Nebulization Daily   sodium chloride  flush  3 mL Intravenous Q12H    Dialysis Orders MWF - AF  (last HD 12/29, full HD - left at 107.4kg) 4:15hr, 400/A1.5, EDW 103kg,  2K/2Ca, AVF, 15g needles, Heparin  4000 unit bolus - Venofer  50mg  weekly - no ESA - VDRA recently held due to ^ Ca (11.2 on 12/17)   Assessment/Plan:  Dyspnea: Progressive, extra HD 12/30 for volume.  Exertional CP: Cardiology involved, echo pending.   ESRD:  Usual MWF schedule. S/p HD yesterday, back again today for usual HD. Hypotensive, feels terrible this AM.  Hypertension/volume: BP high initially, home meds resumed. BP now low and symptomatic. 200mL given with HD today + mido 10mg  to help symptoms. Have d/c hydralazine  and amlodipine  for now. Will add holding parameters to Coreg .  Anemia: Hgb > 12, no ESA for now.  Metabolic bone disease: Ca ok, Phos high - resumed home binders Dollie) T2DM  pA-fib: None recently per cardiology note.  ADDENDUM: 2hr in without UF and mido, BP initially ~90/60 range, then down to 68 systolic. Advised RN to rinse him back and give extra IVF. After doing this, SBP ~90 range. He  continues to feel poorly. D/w Dr. Jerrye who will communicate to the primary team.  Izetta Boehringer, PA-C 04/08/2024, 10:50 AM  [facility]   Seen and examined independently this AM on HD.  Agree with note and exam as documented above by physician extender and as noted here.  HD terminated early as he did not tolerate due to hypotension   General adult male in bed feels poorly HEENT normocephalic atraumatic extraocular movements intact sclera anicteric Neck supple trachea midline Lungs clear to auscultation bilaterally normal work of breathing at rest  Heart S1S2 no rub Abdomen soft nontender nondistended Extremities no edema  Psych normal mood and affect  ESRD on HD - HD ended prematurely today due to hyptension - adjust BP meds as below and assess needs daily - anticipate HD on Friday  Dyspnea - had a treatment on 12/30 for volume and exertional chest pain   HTN  - He was hypotensive as above.  Complaint of a headache.  we stopped amlodipine  and hydralazine  and reached out to primary team re: coreg . Concern for noncompliance at home   Dispo - continue monitoring.  he would need to tolerate HD well and have improved blood pressure control prior to discharge from a strictly renal standpoint    Katheryn JAYSON Jerrye, MD 04/08/2024  5:10 PM  "

## 2024-04-08 NOTE — Progress Notes (Addendum)
 " PROGRESS NOTE    [name]  FMW:[id number] DOB: Dec 16, 1965 DOA: 04/06/2024 PCP: Rik Glinda DASEN, FNP  Chief Complaint  Patient presents with   Shortness of Breath    Brief Narrative:   [name] is [name] 58 y.o. male with Raquel Racey history of ESRD on hemodialysis (MWF), HFpEF, HTN, T2DM, OSA not on CPAP, PAF, right internal jugular vein DVT, asthma, GERD who presented to the [facility] today with shortness of breath during dialysis. Per report, he was hypoxic at dialysis, briefly requiring supplemental oxygen, but was able to be weaned off prior to ED arrival.   Assessment & Plan:   Principal Problem:   Acute on chronic heart failure with preserved ejection fraction (HFpEF) (HCC) Active Problems:   AF (paroxysmal atrial fibrillation) (HCC)   ESRD on dialysis (HCC)   DMII (diabetes mellitus, type 2) (HCC)   Severe uncontrolled hypertension   Anxiety   Sleep apnea  ESRD  Volume Overload Acute on chronic HFpEF/biventricular failure - appreciate renal assistance, s/p dialysis today - volume per renal.  Apparently on transplant list.  - awaiting echo, appreciate cardiology assistance.  Considering RHC?  Previous echo with grade II diastolic dysfunction in 06/2023.   Addendum: hypotensive during dialysis.  Improved after bolus, midodrine.  Amlodipine , hydralazine  d/c'd.  Coreg  dose reduced.    Per discussion with renal, needs to tolerate dialysis and improved BP prior to discharge.  Maybe Friday?   Chronic demand myocardial ischemia: Troponin elevated with flat trend despite ESRD and no ongoing chest complaints. Had no reversible abnormalities on recent stress test. Cardiology does not suspect ACS at this time.  - Continue anticoagulation, beta blocker, not on statin   HTN: BP significantly elevated. ?adherence to outpatient regimen.  - volume per renal - amlodipine , coreg , hydralazine    T2DM: Recent HbA1c 7.1%.  - Don't see any active orders for medications.  - SSI while hospitalized.   - No plan to start statin per outpatient cardiology.   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  - Continue eliquis , now down to 5mg  dose   PAF: None currently detected - Continue routine monitoring with ILR per Dr. Inocencio.   OSA: Nonadherent to CPAP.  - Counseling provided, declines this while inpatient.   Medication nonadherence: This has been mentioned in prior notes and dispense history shows many medications that were dispensed but never picked up by the patient.  - Will need to reinforce this going forward.     DVT prophylaxis: eliquis  Code Status: full Family Communication: none Disposition:   Status is: Observation The patient remains OBS appropriate and will d/c before 2 midnights.   Consultants:  Cardiology nephrology  Procedures:  none  Antimicrobials:  Anti-infectives (From admission, onward)    None       Subjective:  No new complaints Asking for his backpack at bedside  Objective: Vitals:   04/07/24 2338 04/08/24 0327 04/08/24 0403 04/08/24 0520  BP: 118/76 126/87  115/77  Pulse: 84 74  73  Resp: 18 18  17   Temp: 98.4 F (36.9 C)   98.4 F (36.9 C)  TempSrc: Oral   Oral  SpO2: 96% 100%  99%  Weight:   108 kg   Height:   5' 7 (1.702 m)     Intake/Output Summary (Last 24 hours) at 04/08/2024 0756 Last data filed at 04/07/2024 1239 Gross per 24 hour  Intake --  Output 3700 ml  Net -3700 ml   [employer]  04/07/24 0755 04/08/24 0403  Weight: 108 kg 108 kg    Examination:  General: No acute distress. Cardiovascular: RRR Lungs: unlabored Neurological: Alert and oriented 3. Moves all extremities 4 with equal strength. Cranial nerves II through XII grossly intact. Extremities: No clubbing or cyanosis. No edema.    Data Reviewed: I have personally reviewed following labs and imaging studies  CBC: Recent Labs  Lab 04/06/24 1547 04/06/24 1648 04/07/24 0534  WBC 9.7  --  8.7  HGB 11.1* 11.6* 10.1*  HCT 33.5*  34.0* 31.1*  MCV 94.6  --  94.8  PLT 410*  --  384    Basic Metabolic Panel: Recent Labs  Lab 04/06/24 1547 04/06/24 1648 04/07/24 0534  NA 136 134* 135  K 4.4 4.1 4.4  CL 91*  --  92*  CO2 29  --  29  GLUCOSE 170*  --  197*  BUN 43*  --  53*  CREATININE 8.51*  --  10.00*  CALCIUM  9.5  --  9.5  PHOS  --   --  8.6*    GFR: Estimated Creatinine Clearance: 9.6 mL/min (Jakelin Taussig) (by C-G formula based on SCr of 10 mg/dL (H)).  Liver Function Tests: Recent Labs  Lab 04/07/24 0534  ALBUMIN 3.7    CBG: Recent Labs  Lab 04/07/24 0727 04/07/24 1831 04/07/24 2159  GLUCAP 142* 188* 117*     Recent Results (from the past 240 hours)  Resp panel by RT-PCR (RSV, Flu Tamura Lasky&B, Covid) Anterior Nasal Swab     Status: None   Collection Time: 04/06/24  7:40 PM   Specimen: Anterior Nasal Swab  Result Value Ref Range Status   SARS Coronavirus 2 by RT PCR NEGATIVE NEGATIVE Final   Influenza Jacie Tristan by PCR NEGATIVE NEGATIVE Final   Influenza B by PCR NEGATIVE NEGATIVE Final    Comment: (NOTE) The Xpert Xpress SARS-CoV-2/FLU/RSV plus assay is intended as an aid in the diagnosis of influenza from Nasopharyngeal swab specimens and should not be used as Antavia Tandy sole basis for treatment. Nasal washings and aspirates are unacceptable for Xpert Xpress SARS-CoV-2/FLU/RSV testing.  Fact Sheet for Patients: bloggercourse.com  Fact Sheet for Healthcare Providers: seriousbroker.it  This test is not yet approved or cleared by the [place]  FDA and has been authorized for detection and/or diagnosis of SARS-CoV-2 by FDA under an Emergency Use Authorization (EUA). This EUA will remain in effect (meaning this test can be used) for the duration of the COVID-19 declaration under Section 564(b)(1) of the Act, 21 U.S.C. section 360bbb-3(b)(1), unless the authorization is terminated or revoked.     Resp Syncytial Virus by PCR NEGATIVE NEGATIVE Final    Comment:  (NOTE) Fact Sheet for Patients: bloggercourse.com  Fact Sheet for Healthcare Providers: seriousbroker.it  This test is not yet approved or cleared by the [place]  FDA and has been authorized for detection and/or diagnosis of SARS-CoV-2 by FDA under an Emergency Use Authorization (EUA). This EUA will remain in effect (meaning this test can be used) for the duration of the COVID-19 declaration under Section 564(b)(1) of the Act, 21 U.S.C. section 360bbb-3(b)(1), unless the authorization is terminated or revoked.  Performed at [facility], [address] [place], [place] [address]          Radiology Studies: DG Chest 2 View Result Date: 04/06/2024 CLINICAL DATA:  Chest pain for 2-3 days. EXAM: CHEST - 2 VIEW COMPARISON:  02/17/2024 FINDINGS: Cardiomegaly is stable. Unchanged mediastinal contours with aortic atherosclerosis. Peribronchial thickening  without focal airspace disease. Trace fluid in the fissures, no sub pulmonic effusion. No pneumothorax. Left chest wall loop recorder. Left subclavian stent. On limited assessment, no acute osseous findings. IMPRESSION: Cardiomegaly. Peribronchial thickening may be bronchitic or congestive. Electronically Signed   By: Andrea Gasman M.D.   On: 04/06/2024 19:08        Scheduled Meds:  amLODipine   10 mg Oral Daily   apixaban   5 mg Oral BID   arformoterol   15 mcg Nebulization BID   budesonide   0.5 mg Nebulization BID   carvedilol   25 mg Oral BID WC   Chlorhexidine  Gluconate Cloth  6 each Topical Q0600   dextromethorphan -guaiFENesin   1 tablet Oral BID   ferric citrate  630 mg Oral TID WC   hydrALAZINE   50 mg Oral TID   insulin  aspart  0-5 Units Subcutaneous QHS   insulin  aspart  0-6 Units Subcutaneous TID WC   revefenacin   175 mcg Nebulization Daily   sodium chloride  flush  3 mL Intravenous Q12H   Continuous Infusions:   LOS: 0 days    Time spent: over 30 min      Meliton Monte, MD [facility]   To contact the attending provider between 7A-7P or the covering provider during after hours 7P-7A, please log into the web site [email or web address] and access using universal Day password for that web site. If you do not have the password, please call the hospital operator.  04/08/2024, 7:56 AM    "

## 2024-04-08 NOTE — Progress Notes (Signed)
" °   04/08/24 1232  Vitals  Temp 98.1 F (36.7 C)  Pulse Rate 66  Resp 18  BP 90/61  SpO2 96 %  O2 Device Nasal Cannula  Weight  (Unable to get a weight)  Type of Weight Post-Dialysis  Oxygen Therapy  O2 Flow Rate (L/min) 2 L/min  Patient Activity (if Appropriate) In bed  Pulse Oximetry Type Continuous  Post Treatment  Dialyzer Clearance Clear  Hemodialysis Intake (mL) 0 mL  Liters Processed 41.7  Fluid Removed (mL) -0.6 mL  Tolerated HD Treatment (S)  No (Comment) (decrease in SBP)  Post-Hemodialysis Comments Pt  SBP drop to 77/55, gave pt NS 300 Bolus per NP Stovall and MD Jerrye request to Rinseback pt and take pt off the machine. UF goal was not maintained and midodrine 10 mg was given per order. Pt is stable as of now and report call to ED RN Chiquita Pay.   Received patient in bed to unit.  Alert and oriented.  Informed consent signed and in chart.   TX duration: 1 hour 44 mins left  Patient tolerated well.  Transported back to the room  Alert, without acute distress.  Hand-off given to patient's nurse.   Access used: Yes Access issues: No  Total UF removed: -0.6 Medication(s) given: See MAR Post HD VS: See Above Grid Post HD weight: None   Zebedee DELENA [facility] "

## 2024-04-08 NOTE — Progress Notes (Signed)
"  °  Progress Note  Patient Name: [name] Date of Encounter: 04/08/2024 [facility] Cardiologist: Shelda Bruckner, MD   Interval Summary   HD yesterday, cramping.   Vital Signs Vitals:   04/07/24 2338 04/08/24 0327 04/08/24 0403 04/08/24 0520  BP: 118/76 126/87  115/77  Pulse: 84 74  73  Resp: 18 18  17   Temp: 98.4 F (36.9 C)   98.4 F (36.9 C)  TempSrc: Oral   Oral  SpO2: 96% 100%  99%  Weight:   108 kg   Height:   5' 7 (1.702 m)     Intake/Output Summary (Last 24 hours) at 04/08/2024 9371 Last data filed at 04/07/2024 1239 Gross per 24 hour  Intake --  Output 3700 ml  Net -3700 ml      04/08/2024    4:03 AM 04/07/2024    7:55 AM 02/18/2024   12:25 PM  Last 3 Weights  Weight (lbs) 238 lb 1.6 oz 238 lb 1.6 oz 244 lb 14.9 oz  Weight (kg) 108 kg 108 kg 111.1 kg      Telemetry/ECG  No adverse arrhythmias- Personally Reviewed  Echo 06/11/2023 with EF 65% grade 2 diastolic dysfunction mild mitral regurgitation pulmonary pressures 34 mmHg.  Heart catheterization 2022-LVEDP 15 mmHg with minimal CAD.  Physical Exam  GEN: No acute distress.   Neck: No JVD Cardiac: RRR, no murmurs, rubs, or gallops.  Respiratory: Clear to auscultation bilaterally. GI: Soft, nontender, non-distended  MS: No edema  Assessment & Plan   58 year old with end-stage renal disease on hemodialysis here with acute on chronic diastolic heart failure and exertional chest pain with concomitant diabetes hypertension and obstructive sleep apnea as well as paroxysmal atrial fibrillation with loop recorder not showing any evidence over the past 6 months.  Acute on chronic diastolic heart failure - Echocardiogram currently pending (was in dialysis during prior attempt).  Could have a degree of RV failure with pulmonary hypertension driving his symptoms given chronic diastolic/elevated left atrial filling pressures/difficult to control hypertension and untreated OSA/noncompliance  as well as obesity.  Continue with hemodialysis for fluid control.  No urgent need for right heart catheterization.  Exertional chest pain - Back in 2022 had heart catheterization with minimal lesions.  Nuclear stress test a few months ago was unremarkable.  Reassuring.  May be microvascular or secondary to pulmonary hypertension.  Elevated troponin - 208 flat.  Compatible with chronic myocardial injury in the setting of end-stage renal disease.  Chronic.  DVT 02/2024, RIJ - On apixaban .  OSA - Continue encourage compliance with CPAP.  Paroxysmal A-fib - Diagnosed during hospitalization at [facility] approximately a year ago.  Implantable loop recorder shows no further evidence at 6 months.  Diabetes with hypertension - Blood pressure has resulted in severe left ventricular hypertrophy.  Reportedly taking carvedilol  25 mg twice a day hydralazine  50 mg 3 times a day and amlodipine  10 mg a day at home.  For questions or updates, please contact [facility] Please consult [email or web address] for contact info under         Signed, Oneil Parchment, MD   "

## 2024-04-08 NOTE — Progress Notes (Signed)
 Echocardiogram 2D Echocardiogram has been performed.  Juliene JINNY Rucks 04/08/2024, 3:27 PM

## 2024-04-08 NOTE — Progress Notes (Signed)
 Pt receives out-pt HD at [facility] on MWF 5:25 am chair time. Will assist as needed.   Randine Mungo Dialysis Navigator [phone]

## 2024-04-08 NOTE — ED Notes (Signed)
 I called and placed pt on monitor at 0444

## 2024-04-09 DIAGNOSIS — Z992 Dependence on renal dialysis: Secondary | ICD-10-CM

## 2024-04-09 DIAGNOSIS — I959 Hypotension, unspecified: Secondary | ICD-10-CM | POA: Diagnosis not present

## 2024-04-09 DIAGNOSIS — N186 End stage renal disease: Secondary | ICD-10-CM | POA: Diagnosis not present

## 2024-04-09 DIAGNOSIS — I480 Paroxysmal atrial fibrillation: Secondary | ICD-10-CM

## 2024-04-09 DIAGNOSIS — I5033 Acute on chronic diastolic (congestive) heart failure: Secondary | ICD-10-CM | POA: Diagnosis not present

## 2024-04-09 LAB — CBC WITH DIFFERENTIAL/PLATELET
Abs Immature Granulocytes: 0.04 K/uL (ref 0.00–0.07)
Basophils Absolute: 0.1 K/uL (ref 0.0–0.1)
Basophils Relative: 1 %
Eosinophils Absolute: 0.3 K/uL (ref 0.0–0.5)
Eosinophils Relative: 3 %
HCT: 35.7 % — ABNORMAL LOW (ref 39.0–52.0)
Hemoglobin: 11.5 g/dL — ABNORMAL LOW (ref 13.0–17.0)
Immature Granulocytes: 0 %
Lymphocytes Relative: 22 %
Lymphs Abs: 2.4 K/uL (ref 0.7–4.0)
MCH: 30.6 pg (ref 26.0–34.0)
MCHC: 32.2 g/dL (ref 30.0–36.0)
MCV: 94.9 fL (ref 80.0–100.0)
Monocytes Absolute: 0.9 K/uL (ref 0.1–1.0)
Monocytes Relative: 8 %
Neutro Abs: 7.2 K/uL (ref 1.7–7.7)
Neutrophils Relative %: 66 %
Platelets: 422 K/uL — ABNORMAL HIGH (ref 150–400)
RBC: 3.76 MIL/uL — ABNORMAL LOW (ref 4.22–5.81)
RDW: 13.6 % (ref 11.5–15.5)
WBC: 10.9 K/uL — ABNORMAL HIGH (ref 4.0–10.5)
nRBC: 0 % (ref 0.0–0.2)

## 2024-04-09 LAB — COMPREHENSIVE METABOLIC PANEL WITH GFR
ALT: <5 U/L (ref 0–44)
AST: 14 U/L — ABNORMAL LOW (ref 15–41)
Albumin: 3.8 g/dL (ref 3.5–5.0)
Alkaline Phosphatase: 117 U/L (ref 38–126)
Anion gap: 15 (ref 5–15)
BUN: 45 mg/dL — ABNORMAL HIGH (ref 6–20)
CO2: 29 mmol/L (ref 22–32)
Calcium: 9.8 mg/dL (ref 8.9–10.3)
Chloride: 91 mmol/L — ABNORMAL LOW (ref 98–111)
Creatinine, Ser: 8.11 mg/dL — ABNORMAL HIGH (ref 0.61–1.24)
GFR, Estimated: 7 mL/min — ABNORMAL LOW
Glucose, Bld: 124 mg/dL — ABNORMAL HIGH (ref 70–99)
Potassium: 4.7 mmol/L (ref 3.5–5.1)
Sodium: 135 mmol/L (ref 135–145)
Total Bilirubin: 0.3 mg/dL (ref 0.0–1.2)
Total Protein: 7.7 g/dL (ref 6.5–8.1)

## 2024-04-09 LAB — PHOSPHORUS: Phosphorus: 7.6 mg/dL — ABNORMAL HIGH (ref 2.5–4.6)

## 2024-04-09 LAB — CBG MONITORING, ED
Glucose-Capillary: 123 mg/dL — ABNORMAL HIGH (ref 70–99)
Glucose-Capillary: 266 mg/dL — ABNORMAL HIGH (ref 70–99)
Glucose-Capillary: 163 mg/dL — ABNORMAL HIGH (ref 70–99)

## 2024-04-09 LAB — GLUCOSE, CAPILLARY: Glucose-Capillary: 172 mg/dL — ABNORMAL HIGH (ref 70–99)

## 2024-04-09 LAB — MAGNESIUM: Magnesium: 2.6 mg/dL — ABNORMAL HIGH (ref 1.7–2.4)

## 2024-04-09 MED ORDER — CHLORHEXIDINE GLUCONATE CLOTH 2 % EX PADS
6.0000 | MEDICATED_PAD | Freq: Every day | CUTANEOUS | Status: DC
  Administered 2024-04-10 – 2024-04-11 (×2): 6 via TOPICAL

## 2024-04-09 NOTE — ED Notes (Signed)
 Called 2C and made them aware of transport up to the floor.

## 2024-04-09 NOTE — Progress Notes (Signed)
 " PROGRESS NOTE    [name]  FMW:[id number] DOB: 10/03/1965 DOA: 04/06/2024 PCP: Rik Glinda DASEN, FNP  Chief Complaint  Patient presents with   Shortness of Breath    Brief Narrative:   [name] is [name] 59 y.o. male with Ebba Goll history of ESRD on hemodialysis (MWF), HFpEF, HTN, T2DM, OSA not on CPAP, PAF, right internal jugular vein DVT, asthma, GERD who presented to the [facility] today with shortness of breath during dialysis. Per report, he was hypoxic at dialysis, briefly requiring supplemental oxygen, but was able to be weaned off prior to ED arrival.   Assessment & Plan:   Principal Problem:   Acute on chronic heart failure with preserved ejection fraction (HFpEF) (HCC) Active Problems:   AF (paroxysmal atrial fibrillation) (HCC)   ESRD on dialysis (HCC)   DMII (diabetes mellitus, type 2) (HCC)   Severe uncontrolled hypertension   Anxiety   Sleep apnea  ESRD  Volume Overload Acute on chronic HFpEF/biventricular failure - appreciate renal assistance, s/p dialysis today - volume per renal.  Apparently on transplant list.  - hypotensive during dialysis 12/31, amlodipine  and hydralazine  discontinued - coreg  dose reduced - per discussion with renal, will monitor today, possible d/c Friday, would like him to tolerate dialysis prior to discharge - echo with normal EF, grade 1 diastolic dysfunction, IVC normal with greater than 50% resp variability (cards to consider PYP scan outpatient for concern for amyloid with speckled myocardium), appreciate cardiology assistance.  Considering RHC?  Previous echo with grade II diastolic dysfunction in 06/2023.  Chronic demand myocardial ischemia: Troponin elevated with flat trend despite ESRD and no ongoing chest complaints. Had no reversible abnormalities on recent stress test. Cardiology does not suspect ACS at this time.  - Continue anticoagulation, beta blocker, not on statin   HTN: ?adherence to outpatient regimen.  - coreg  dose reduced -  amlodipine  and hydralazine  on hold   T2DM: Recent HbA1c 7.1%.  - Don't see any active orders for medications.  - SSI while hospitalized.  - No plan to start statin per outpatient cardiology.   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  - Continue eliquis , now down to 5mg  dose   PAF: None currently detected - Continue routine monitoring with ILR per Dr. Inocencio.   OSA: Nonadherent to CPAP.  - Counseling provided, declines this while inpatient.   Medication nonadherence: This has been mentioned in prior notes and dispense history shows many medications that were dispensed but never picked up by the patient.  - Will need to reinforce this going forward.     DVT prophylaxis: eliquis  Code Status: full Family Communication: none Disposition:   Status is: Observation The patient remains OBS appropriate and will d/c before 2 midnights.   Consultants:  Cardiology nephrology  Procedures:  none  Antimicrobials:  Anti-infectives (From admission, onward)    None       Subjective:  Feeling ok today  Objective: Vitals:   04/08/24 2240 04/09/24 0146 04/09/24 0635 04/09/24 0659  BP: 112/85 121/80 (!) 152/95   Pulse: 75 70 73   Resp: 16 16 19    Temp:  97.8 F (36.6 C)  98 F (36.7 C)  TempSrc:  Oral  Oral  SpO2: 95% 99% 97%   Weight:      Height:        Intake/Output Summary (Last 24 hours) at 04/09/2024 9166 Last data filed at 04/08/2024 1232 Gross per 24 hour  Intake --  Output -0.6 ml  Net 0.6 ml   Filed Weights   04/07/24 0755 04/08/24 0403 04/08/24 1022  Weight: 108 kg 108 kg 108 kg    Examination:  General: sitting up in ED stretcher  Cardiovascular: RRR Lungs: unlabored Neurological: Alert and oriented 3. Moves all extremities 4 with equal strength. Cranial nerves II through XII grossly intact. Extremities: No clubbing or cyanosis. No edema.   Data Reviewed: I have personally reviewed following labs and imaging studies  CBC: Recent Labs   Lab 04/06/24 1547 04/06/24 1648 04/07/24 0534 04/08/24 0817 04/09/24 0346  WBC 9.7  --  8.7 10.2 10.9*  NEUTROABS  --   --   --  7.0 7.2  HGB 11.1* 11.6* 10.1* 12.1* 11.5*  HCT 33.5* 34.0* 31.1* 37.6* 35.7*  MCV 94.6  --  94.8 96.4 94.9  PLT 410*  --  384 455* 422*    Basic Metabolic Panel: Recent Labs  Lab 04/06/24 1547 04/06/24 1648 04/07/24 0534 04/08/24 0817 04/09/24 0346  NA 136 134* 135 135 135  K 4.4 4.1 4.4 5.0 4.7  CL 91*  --  92* 90* 91*  CO2 29  --  29 29 29   GLUCOSE 170*  --  197* 165* 124*  BUN 43*  --  53* 45* 45*  CREATININE 8.51*  --  10.00* 8.66* 8.11*  CALCIUM  9.5  --  9.5 10.0 9.8  MG  --   --   --  2.8* 2.6*  PHOS  --   --  8.6* 8.7* 7.6*    GFR: Estimated Creatinine Clearance: 11.8 mL/min (Adeleigh Barletta) (by C-G formula based on SCr of 8.11 mg/dL (H)).  Liver Function Tests: Recent Labs  Lab 04/07/24 0534 04/08/24 0817 04/09/24 0346  AST  --  13* 14*  ALT  --  6 <5  ALKPHOS  --  126 117  BILITOT  --  0.4 0.3  PROT  --  8.3* 7.7  ALBUMIN 3.7 4.1 3.8    CBG: Recent Labs  Lab 04/07/24 2159 04/08/24 0812 04/08/24 1435 04/08/24 2132 04/09/24 0806  GLUCAP 117* 154* 102* 144* 123*     Recent Results (from the past 240 hours)  Resp panel by RT-PCR (RSV, Flu Gennaro Lizotte&B, Covid) Anterior Nasal Swab     Status: None   Collection Time: 04/06/24  7:40 PM   Specimen: Anterior Nasal Swab  Result Value Ref Range Status   SARS Coronavirus 2 by RT PCR NEGATIVE NEGATIVE Final   Influenza Kimbely Whiteaker by PCR NEGATIVE NEGATIVE Final   Influenza B by PCR NEGATIVE NEGATIVE Final    Comment: (NOTE) The Xpert Xpress SARS-CoV-2/FLU/RSV plus assay is intended as an aid in the diagnosis of influenza from Nasopharyngeal swab specimens and should not be used as Breylen Agyeman sole basis for treatment. Nasal washings and aspirates are unacceptable for Xpert Xpress SARS-CoV-2/FLU/RSV testing.  Fact Sheet for Patients: bloggercourse.com  Fact Sheet for Healthcare  Providers: seriousbroker.it  This test is not yet approved or cleared by the [place]  FDA and has been authorized for detection and/or diagnosis of SARS-CoV-2 by FDA under an Emergency Use Authorization (EUA). This EUA will remain in effect (meaning this test can be used) for the duration of the COVID-19 declaration under Section 564(b)(1) of the Act, 21 U.S.C. section 360bbb-3(b)(1), unless the authorization is terminated or revoked.     Resp Syncytial Virus by PCR NEGATIVE NEGATIVE Final    Comment: (NOTE) Fact Sheet for Patients: bloggercourse.com  Fact Sheet for Healthcare Providers: seriousbroker.it  This test is  not yet approved or cleared by the [place]  FDA and has been authorized for detection and/or diagnosis of SARS-CoV-2 by FDA under an Emergency Use Authorization (EUA). This EUA will remain in effect (meaning this test can be used) for the duration of the COVID-19 declaration under Section 564(b)(1) of the Act, 21 U.S.C. section 360bbb-3(b)(1), unless the authorization is terminated or revoked.  Performed at [facility], [address], [place], [place] [address]          Radiology Studies: ECHOCARDIOGRAM COMPLETE Result Date: 04/08/2024    ECHOCARDIOGRAM REPORT   Patient Name:   [name] Date of Exam: 04/08/2024 Medical Rec #:  [id number]       Height:       67.0 in Accession #:    [id number]      Weight:       238.1 lb Date of Birth:  June 01, 1965        BSA:          2.178 m Patient Age:    58 years        BP:           105/81 mmHg Patient Gender: M               HR:           72 bpm. Exam Location:  Inpatient Procedure: 2D Echo, Cardiac Doppler and Color Doppler (Both Spectral and Color            Flow Doppler were utilized during procedure). Indications:    Congestive Heart Failure. Pulmonary hypertension.  History:        Patient has prior history of Echocardiogram  examinations, most                 recent 06/11/2023. CHF and Cardiomegaly, Arrythmias:Atrial                 Fibrillation, Signs/Symptoms:Shortness of Breath; Risk                 Factors:Hypertension, Diabetes and Sleep Apnea.  Sonographer:    Juliene Rucks Referring Phys: [id number] BERNARD[address] KATHEE COME  Sonographer Comments: Patient is obese. IMPRESSIONS  1. 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  2. Left ventricular ejection fraction, by estimation, is 60 to 65%. The left ventricle has normal function. The left ventricle has no regional wall motion abnormalities. There is severe concentric left ventricular hypertrophy. Left ventricular diastolic  parameters are consistent with Grade I diastolic dysfunction (impaired relaxation). Elevated left atrial pressure.  3. Right ventricular systolic function is normal. The right ventricular size is normal.  4. The mitral valve is normal in structure. No evidence of mitral valve regurgitation. No evidence of mitral stenosis. Moderate mitral annular calcification.  5. The aortic valve is tricuspid. Aortic valve regurgitation is not visualized. Aortic valve sclerosis is present, with no evidence of aortic valve stenosis.  6. The inferior vena cava is normal in size with greater than 50% respiratory variability, suggesting right atrial pressure of 3 mmHg. FINDINGS  Left Ventricle: Left ventricular ejection fraction, by estimation, is 60 to 65%. The left ventricle has normal function. The left ventricle has no regional wall motion abnormalities. The left ventricular internal cavity size was normal in size. There is  severe concentric left ventricular hypertrophy. Left ventricular diastolic parameters are consistent with Grade I diastolic dysfunction (impaired relaxation). Elevated left atrial pressure. Right Ventricle: The right ventricular size is normal. Right ventricular systolic function is  normal. Left Atrium: Left atrial size was normal in size. Right Atrium: Right  atrial size was normal in size. Pericardium: There is no evidence of pericardial effusion. Mitral Valve: The mitral valve is normal in structure. Moderate mitral annular calcification. No evidence of mitral valve regurgitation. No evidence of mitral valve stenosis. Tricuspid Valve: The tricuspid valve is normal in structure. Tricuspid valve regurgitation is trivial. No evidence of tricuspid stenosis. Aortic Valve: The aortic valve is tricuspid. Aortic valve regurgitation is not visualized. Aortic valve sclerosis is present, with no evidence of aortic valve stenosis. Pulmonic Valve: The pulmonic valve was normal in structure. Pulmonic valve regurgitation is not visualized. No evidence of pulmonic stenosis. Aorta: The aortic root is normal in size and structure. Venous: The inferior vena cava is normal in size with greater than 50% respiratory variability, suggesting right atrial pressure of 3 mmHg. IAS/Shunts: The interatrial septum was not well visualized. 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  LEFT VENTRICLE PLAX 2D LVIDd:         4.90 cm   Diastology LVIDs:         3.30 cm   LV e' medial:    4.57 cm/s LV PW:         1.40 cm   LV E/e' medial:  16.4 LV IVS:        1.70 cm   LV e' lateral:   3.81 cm/s LVOT diam:     2.20 cm   LV E/e' lateral: 19.7 LV SV:         79 LV SV Index:   36 LVOT Area:     3.80 cm  RIGHT VENTRICLE RV S prime:     11.40 cm/s TAPSE (M-mode): 1.5 cm LEFT ATRIUM             Index        RIGHT ATRIUM           Index LA diam:        4.40 cm 2.02 cm/m   RA Area:     10.90 cm LA Vol (A2C):   66.0 ml 30.31 ml/m  RA Volume:   18.60 ml  8.54 ml/m LA Vol (A4C):   59.1 ml 27.14 ml/m LA Biplane Vol: 65.4 ml 30.03 ml/m  AORTIC VALVE LVOT Vmax:   117.00 cm/s LVOT Vmean:  73.300 cm/s LVOT VTI:    0.207 m  AORTA Ao Root diam: 3.20 cm Ao Asc diam:  3.00 cm MITRAL VALVE MV Area (PHT): 2.59 cm     SHUNTS MV Decel Time: 293 msec     Systemic VTI:  0.21 m MV E velocity:  75.10 cm/s   Systemic Diam: 2.20 cm MV Ishaq Maffei velocity: 104.00 cm/s MV E/Neelah Mannings ratio:  0.72 Redell Shallow MD Electronically signed by Redell Shallow MD Signature Date/Time: 04/08/2024/3:50:25 PM    Final         Scheduled Meds:  apixaban   5 mg Oral BID   arformoterol   15 mcg Nebulization BID   budesonide   0.5 mg Nebulization BID   carvedilol   12.5 mg Oral BID WC   Chlorhexidine  Gluconate Cloth  6 each Topical Q0600   dextromethorphan -guaiFENesin   1 tablet Oral BID   ferric citrate  630 mg Oral TID WC   insulin  aspart  0-5 Units Subcutaneous QHS   insulin  aspart  0-6 Units Subcutaneous TID WC   revefenacin   175 mcg Nebulization Daily   sodium chloride  flush  3 mL Intravenous Q12H  Continuous Infusions:   LOS: 0 days    Time spent: over 30 min     Meliton Monte, MD [facility]   To contact the attending provider between 7A-7P or the covering provider during after hours 7P-7A, please log into the web site [email or web address] and access using universal [place] password for that web site. If you do not have the password, please call the hospital operator.  04/09/2024, 8:33 AM    "

## 2024-04-09 NOTE — Progress Notes (Addendum)
 " [facility] Progress Note   Subjective:   Did not tolerate full HD yesterday due to hypotension. Meds adjusted, BP at goal on monitor this morning. He reports his breathing is better. Denies CP, dizziness, HA, abdominal pain, N/V. Notes he always feels poorly last hour of HD and his BP drops often.   Objective Vitals:   04/09/24 0146 04/09/24 0635 04/09/24 0659 04/09/24 0836  BP: 121/80 (!) 152/95    Pulse: 70 73    Resp: 16 19    Temp: 97.8 F (36.6 C)  98 F (36.7 C)   TempSrc: Oral  Oral   SpO2: 99% 97%  99%  Weight:      Height:       Physical Exam General: Alert male in NAD Heart: RRR, no murmur Lungs: CTA bilaterally, respirations unlabored on O2 via  Abdomen: soft, non-distended Extremities: no edema b/l lower extremities  Dialysis Access:  LUE AVF + t/b  Additional Objective Labs: Basic Metabolic Panel: Recent Labs  Lab 04/07/24 0534 04/08/24 0817 04/09/24 0346  NA 135 135 135  K 4.4 5.0 4.7  CL 92* 90* 91*  CO2 29 29 29   GLUCOSE 197* 165* 124*  BUN 53* 45* 45*  CREATININE 10.00* 8.66* 8.11*  CALCIUM  9.5 10.0 9.8  PHOS 8.6* 8.7* 7.6*   Liver Function Tests: Recent Labs  Lab 04/07/24 0534 04/08/24 0817 04/09/24 0346  AST  --  13* 14*  ALT  --  6 <5  ALKPHOS  --  126 117  BILITOT  --  0.4 0.3  PROT  --  8.3* 7.7  ALBUMIN 3.7 4.1 3.8   No results for input(s): LIPASE, AMYLASE in the last 168 hours. CBC: Recent Labs  Lab 04/06/24 1547 04/06/24 1648 04/07/24 0534 04/08/24 0817 04/09/24 0346  WBC 9.7  --  8.7 10.2 10.9*  NEUTROABS  --   --   --  7.0 7.2  HGB 11.1*   < > 10.1* 12.1* 11.5*  HCT 33.5*   < > 31.1* 37.6* 35.7*  MCV 94.6  --  94.8 96.4 94.9  PLT 410*  --  384 455* 422*   < > = values in this interval not displayed.   Blood Culture    Component Value Date/Time   SDES BLOOD RIGHT ANTECUBITAL 11/30/2022 1726   SPECREQUEST  11/30/2022 1726    BOTTLES DRAWN AEROBIC AND ANAEROBIC Blood Culture adequate  volume   CULT  11/30/2022 1726    NO GROWTH 5 DAYS Performed at [facility], [address] [place], [place] [address]    REPTSTATUS 12/05/2022 FINAL 11/30/2022 1726    Cardiac Enzymes: No results for input(s): CKTOTAL, CKMB, CKMBINDEX, TROPONINI in the last 168 hours. CBG: Recent Labs  Lab 04/07/24 2159 04/08/24 0812 04/08/24 1435 04/08/24 2132 04/09/24 0806  GLUCAP 117* 154* 102* 144* 123*   Iron Studies: No results for input(s): IRON, TIBC, TRANSFERRIN, FERRITIN in the last 72 hours. @lablastinr3 @ Studies/Results: ECHOCARDIOGRAM COMPLETE Result Date: 04/08/2024    ECHOCARDIOGRAM REPORT   Patient Name:   [name] [place] Date of Exam: 04/08/2024 Medical Rec #:  [id number]       Height:       67.0 in Accession #:    [id number]      Weight:       238.1 lb Date of Birth:  29-Mar-1966        BSA:          2.178 m Patient Age:  58 years        BP:           105/81 mmHg Patient Gender: M               HR:           72 bpm. Exam Location:  Inpatient Procedure: 2D Echo, Cardiac Doppler and Color Doppler (Both Spectral and Color            Flow Doppler were utilized during procedure). Indications:    Congestive Heart Failure. Pulmonary hypertension.  History:        Patient has prior history of Echocardiogram examinations, most                 recent 06/11/2023. CHF and Cardiomegaly, Arrythmias:Atrial                 Fibrillation, Signs/Symptoms:Shortness of Breath; Risk                 Factors:Hypertension, Diabetes and Sleep Apnea.  Sonographer:    Juliene Rucks Referring Phys: [id number] BERNARDINO KATHEE COME  Sonographer Comments: Patient is obese. IMPRESSIONS  1. 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  2. Left ventricular ejection fraction, by estimation, is 60 to 65%. The left ventricle has normal function. The left ventricle has no regional wall motion abnormalities. There is severe concentric left ventricular hypertrophy. Left ventricular diastolic  parameters are  consistent with Grade I diastolic dysfunction (impaired relaxation). Elevated left atrial pressure.  3. Right ventricular systolic function is normal. The right ventricular size is normal.  4. The mitral valve is normal in structure. No evidence of mitral valve regurgitation. No evidence of mitral stenosis. Moderate mitral annular calcification.  5. The aortic valve is tricuspid. Aortic valve regurgitation is not visualized. Aortic valve sclerosis is present, with no evidence of aortic valve stenosis.  6. The inferior vena cava is normal in size with greater than 50% respiratory variability, suggesting right atrial pressure of 3 mmHg. FINDINGS  Left Ventricle: Left ventricular ejection fraction, by estimation, is 60 to 65%. The left ventricle has normal function. The left ventricle has no regional wall motion abnormalities. The left ventricular internal cavity size was normal in size. There is  severe concentric left ventricular hypertrophy. Left ventricular diastolic parameters are consistent with Grade I diastolic dysfunction (impaired relaxation). Elevated left atrial pressure. Right Ventricle: The right ventricular size is normal. Right ventricular systolic function is normal. Left Atrium: Left atrial size was normal in size. Right Atrium: Right atrial size was normal in size. Pericardium: There is no evidence of pericardial effusion. Mitral Valve: The mitral valve is normal in structure. Moderate mitral annular calcification. No evidence of mitral valve regurgitation. No evidence of mitral valve stenosis. Tricuspid Valve: The tricuspid valve is normal in structure. Tricuspid valve regurgitation is trivial. No evidence of tricuspid stenosis. Aortic Valve: The aortic valve is tricuspid. Aortic valve regurgitation is not visualized. Aortic valve sclerosis is present, with no evidence of aortic valve stenosis. Pulmonic Valve: The pulmonic valve was normal in structure. Pulmonic valve regurgitation is not  visualized. No evidence of pulmonic stenosis. Aorta: The aortic root is normal in size and structure. Venous: The inferior vena cava is normal in size with greater than 50% respiratory variability, suggesting right atrial pressure of 3 mmHg. IAS/Shunts: The interatrial septum was not well visualized. 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  LEFT VENTRICLE PLAX 2D LVIDd:         4.90 cm  Diastology LVIDs:         3.30 cm   LV e' medial:    4.57 cm/s LV PW:         1.40 cm   LV E/e' medial:  16.4 LV IVS:        1.70 cm   LV e' lateral:   3.81 cm/s LVOT diam:     2.20 cm   LV E/e' lateral: 19.7 LV SV:         79 LV SV Index:   36 LVOT Area:     3.80 cm  RIGHT VENTRICLE RV S prime:     11.40 cm/s TAPSE (M-mode): 1.5 cm LEFT ATRIUM             Index        RIGHT ATRIUM           Index LA diam:        4.40 cm 2.02 cm/m   RA Area:     10.90 cm LA Vol (A2C):   66.0 ml 30.31 ml/m  RA Volume:   18.60 ml  8.54 ml/m LA Vol (A4C):   59.1 ml 27.14 ml/m LA Biplane Vol: 65.4 ml 30.03 ml/m  AORTIC VALVE LVOT Vmax:   117.00 cm/s LVOT Vmean:  73.300 cm/s LVOT VTI:    0.207 m  AORTA Ao Root diam: 3.20 cm Ao Asc diam:  3.00 cm MITRAL VALVE MV Area (PHT): 2.59 cm     SHUNTS MV Decel Time: 293 msec     Systemic VTI:  0.21 m MV E velocity: 75.10 cm/s   Systemic Diam: 2.20 cm MV A velocity: 104.00 cm/s MV E/A ratio:  0.72 Redell Shallow MD Electronically signed by Redell Shallow MD Signature Date/Time: 04/08/2024/3:50:25 PM    Final    Medications:   apixaban   5 mg Oral BID   arformoterol   15 mcg Nebulization BID   budesonide   0.5 mg Nebulization BID   carvedilol   12.5 mg Oral BID WC   Chlorhexidine  Gluconate Cloth  6 each Topical Q0600   dextromethorphan -guaiFENesin   1 tablet Oral BID   ferric citrate  630 mg Oral TID WC   insulin  aspart  0-5 Units Subcutaneous QHS   insulin  aspart  0-6 Units Subcutaneous TID WC   revefenacin   175 mcg Nebulization Daily   sodium chloride  flush  3  mL Intravenous Q12H    Dialysis Orders: MWF - AF  (last HD 12/29, full HD - left at 107.4kg) 4:15hr, 400/A1.5, EDW 103kg, 2K/2Ca, AVF, 15g needles, Heparin  4000 unit bolus - Venofer  50mg  weekly - no ESA - VDRA recently held due to ^ Ca (11.2 on 12/17)  Assessment/Plan:  Dyspnea: Progressive, extra HD 12/30 for volume. Developed hypotension so did not tolerate HD yesterday. Will plan for next HD tomorrow per regular schedule.   Exertional CP: Cardiology involved  ESRD:  Usual MWF schedule- see above. Next HD tomorrow.   Hypertension/volume: BP high initially, home meds resumed but was then hypOtensive, required IV fluids on HD yesterday. Hold BP meds prior to dialysis. Hydralazine  and amlodipine  have been stopped  Anemia: Hgb > 12, no ESA for now.  Metabolic bone disease: Ca ok, Phos high - resumed home binders Dollie)   Lucie Collet, PA-C 04/09/2024, 8:47 AM  [facility] Pager: [phone]   Seen and examined independently.  Agree with note and exam as documented above by physician extender and as noted here.  Spoke with patient jointly at length with  cardiology attending.  Patient appears to have made considerable weight loss efforts and may no longer require amlodipine  and hydralazine .  He has been feeling ok on the coreg  at 12.5 mg BID.  He has been eating a lot of ice because felt that it was better than drinking water.  We discussed that ice would be fluid.  He states that someone has offered to donate a kidney.  .    General adult male in bed in no acute distress HEENT normocephalic atraumatic extraocular movements intact sclera anicteric Neck supple trachea midline Lungs clear to auscultation bilaterally normal work of breathing at rest  Heart S1S2 no rub Abdomen soft nontender nondistended Extremities no edema  Psych normal mood and affect Neuro - alert and conversant; provides hx and follows commands  Access LUE AVF with bruit and thrill   ESRD  on HD - HD per MWF schedule  - he didn't tolerate last HD due to hypotension - he is going to work on limiting ice and fluid intake - wants a kidney - recommended to discuss with outpatient team.  Candidacy for transplant is unclear.  Would need to continue to follow-up with cardiology outpatient for additional evaluation given the TTE findings.   HTN  - continue coreg  at reduced dose of 12.5 mg BID  - as above, he has lost a significant amount of fluid and does not appear to require amlodipine  and hydralazine  any more  Dyspnea - improved - goal to optimize volume status with HD.  Should be easier now that a couple of BP meds are off  Disposition - HD here tomorrow and then may be able to discharge after from a nephrology standpoint if he tolerates HD  Katheryn JAYSON Saba, MD 04/09/2024  2:32 PM   "

## 2024-04-09 NOTE — Progress Notes (Signed)
 "   Rounding Note    Patient Name: [name] Date of Encounter: 04/09/2024   [facility] Cardiologist: Shelda Bruckner, MD   Subjective   Feeling somewhat better today. Antihypertensives have been held and his blood pressure is improved. He notes that he has had a cough for some time, has pain in his chest after he has coughed for a while. Eventually he will bring mucus up. Due to the cough, he had been drinking more fluids. He endorsed taking all of his meds at home routinely. He notes that typically about 3 hours into his HD, he felt dizzy/lightheaded, had cold sweats, and has nearly passed out. He has been told that his blood pressure is low when it is checked during these episodes.   He notes that he has intentionally been working to lose weight, between about 20-30 lbs.  Inpatient Medications    Scheduled Meds:  apixaban   5 mg Oral BID   arformoterol   15 mcg Nebulization BID   budesonide   0.5 mg Nebulization BID   carvedilol   12.5 mg Oral BID WC   Chlorhexidine  Gluconate Cloth  6 each Topical Q0600   Chlorhexidine  Gluconate Cloth  6 each Topical Q0600   dextromethorphan -guaiFENesin   1 tablet Oral BID   ferric citrate  630 mg Oral TID WC   insulin  aspart  0-5 Units Subcutaneous QHS   insulin  aspart  0-6 Units Subcutaneous TID WC   revefenacin   175 mcg Nebulization Daily   sodium chloride  flush  3 mL Intravenous Q12H   Continuous Infusions:  PRN Meds: acetaminophen  **OR** acetaminophen , albuterol , benzonatate    Vital Signs    Vitals:   04/09/24 0659 04/09/24 0836 04/09/24 1000 04/09/24 1055  BP:    104/71  Pulse:   71 68  Resp:   18 19  Temp: 98 F (36.7 C)  97.8 F (36.6 C) 97.6 F (36.4 C)  TempSrc: Oral  Oral Oral  SpO2:  99% 98% 100%  Weight:      Height:       No intake or output data in the 24 hours ending 04/09/24 1428     04/08/2024   12:32 PM 04/08/2024   10:22 AM 04/08/2024    4:03 AM  Last 3 Weights  Weight (lbs) -- 238 lb  1.6 oz 238 lb 1.6 oz  Weight (kg) -- 108 kg 108 kg      Telemetry    SR - Personally Reviewed  Physical Exam   GEN: Well nourished, well developed in no acute distress NECK: JVD not well visualized 2/2 body habitus CARDIAC: regular rhythm, normal S1 and S2, no rubs or gallops. No cardiac murmur heard, but bruit from AVF radiates VASCULAR: Radial pulses 2+ bilaterally.  RESPIRATORY:  Clear to auscultation without rales, wheezing or rhonchi  ABDOMEN: Soft, non-tender, non-distended MUSCULOSKELETAL:  Moves all 4 limbs independently SKIN: Warm and dry, no edema NEUROLOGIC:  No focal neuro deficits noted. PSYCHIATRIC:  Normal affect    New pertinent results (labs, ECG, imaging, cardiac studies)    Echo from 04/08/24 personally reviewed, see below  Assessment & Plan    Acute on chronic diastolic heart failure ESRD -echo 04/08/24 with EF 60-65%, severe LVH, G1DD, normal RV size/function, RAP 3. Suggests not a primary cardiac etiology, reassuring. -volume management through dialysis. I/O not well documented other than HD fluid removal.  Labile blood pressure, with baseline hypertension Left ventricular hypertrophy 2/2 hypertension history -has had hypotension with dialysis, hydralazine  and amlodipine  on hold,  blood pressure initally elevated this AM then recheck was low. On review of BP on machine, has been well controlled in the 105-125 range today off medication -suspect that with his weight loss, his blood pressure control has improved, and then he becomes hypotensive with dialysis, resulting in his symptoms -limits options for GDMT, but priority is to avoid symptomatic hypotension. Would continue carvedilol  at current dose, hold hydralazine  and amlodipine .  -planned for dialysis tomorrow. 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

## 2024-04-10 DIAGNOSIS — I5033 Acute on chronic diastolic (congestive) heart failure: Secondary | ICD-10-CM | POA: Diagnosis not present

## 2024-04-10 LAB — CBC WITH DIFFERENTIAL/PLATELET
Abs Immature Granulocytes: 0.04 K/uL (ref 0.00–0.07)
Basophils Absolute: 0 K/uL (ref 0.0–0.1)
Basophils Relative: 0 %
Eosinophils Absolute: 0.2 K/uL (ref 0.0–0.5)
Eosinophils Relative: 2 %
HCT: 35.6 % — ABNORMAL LOW (ref 39.0–52.0)
Hemoglobin: 11.6 g/dL — ABNORMAL LOW (ref 13.0–17.0)
Immature Granulocytes: 0 %
Lymphocytes Relative: 19 %
Lymphs Abs: 2.1 K/uL (ref 0.7–4.0)
MCH: 30.8 pg (ref 26.0–34.0)
MCHC: 32.6 g/dL (ref 30.0–36.0)
MCV: 94.4 fL (ref 80.0–100.0)
Monocytes Absolute: 0.7 K/uL (ref 0.1–1.0)
Monocytes Relative: 6 %
Neutro Abs: 7.9 K/uL — ABNORMAL HIGH (ref 1.7–7.7)
Neutrophils Relative %: 73 %
Platelets: 445 K/uL — ABNORMAL HIGH (ref 150–400)
RBC: 3.77 MIL/uL — ABNORMAL LOW (ref 4.22–5.81)
RDW: 13.4 % (ref 11.5–15.5)
WBC: 10.9 K/uL — ABNORMAL HIGH (ref 4.0–10.5)
nRBC: 0 % (ref 0.0–0.2)

## 2024-04-10 LAB — GLUCOSE, CAPILLARY
Glucose-Capillary: 130 mg/dL — ABNORMAL HIGH (ref 70–99)
Glucose-Capillary: 233 mg/dL — ABNORMAL HIGH (ref 70–99)
Glucose-Capillary: 95 mg/dL (ref 70–99)
Glucose-Capillary: 146 mg/dL — ABNORMAL HIGH (ref 70–99)

## 2024-04-10 LAB — COMPREHENSIVE METABOLIC PANEL WITH GFR
ALT: <5 U/L (ref 0–44)
AST: 14 U/L — ABNORMAL LOW (ref 15–41)
Albumin: 3.8 g/dL (ref 3.5–5.0)
Alkaline Phosphatase: 115 U/L (ref 38–126)
Anion gap: 22 — ABNORMAL HIGH (ref 5–15)
BUN: 65 mg/dL — ABNORMAL HIGH (ref 6–20)
CO2: 22 mmol/L (ref 22–32)
Calcium: 9.7 mg/dL (ref 8.9–10.3)
Chloride: 90 mmol/L — ABNORMAL LOW (ref 98–111)
Creatinine, Ser: 11.2 mg/dL — ABNORMAL HIGH (ref 0.61–1.24)
GFR, Estimated: 5 mL/min — ABNORMAL LOW
Glucose, Bld: 124 mg/dL — ABNORMAL HIGH (ref 70–99)
Potassium: 4.8 mmol/L (ref 3.5–5.1)
Sodium: 134 mmol/L — ABNORMAL LOW (ref 135–145)
Total Bilirubin: 0.3 mg/dL (ref 0.0–1.2)
Total Protein: 7.7 g/dL (ref 6.5–8.1)

## 2024-04-10 LAB — MRSA NEXT GEN BY PCR, NASAL: MRSA by PCR Next Gen: NOT DETECTED

## 2024-04-10 LAB — PHOSPHORUS: Phosphorus: 8.6 mg/dL — ABNORMAL HIGH (ref 2.5–4.6)

## 2024-04-10 LAB — MAGNESIUM: Magnesium: 2.8 mg/dL — ABNORMAL HIGH (ref 1.7–2.4)

## 2024-04-10 MED ORDER — CARVEDILOL 12.5 MG PO TABS: 12.5000 mg | ORAL_TABLET | Freq: Two times a day (BID) | ORAL | Status: DC

## 2024-04-10 NOTE — Progress Notes (Addendum)
 " [name] Progress Note   Subjective:   Denies SOB, reports ongoing cough but feels it is improved. No CP or dizziness.   Objective Vitals:   04/10/24 0501 04/10/24 0727 04/10/24 0818 04/10/24 0819  BP: (!) 152/78 129/82    Pulse: 68 74 76   Resp: 17 11 13    Temp: 97.8 F (36.6 C) (!) 97.3 F (36.3 C)    TempSrc: Oral Oral    SpO2: 93% 93% 95% 95%  Weight: 103.7 kg     Height:       Physical Exam General: Alert male in NAD Heart: RRR, no murmur Lungs: CTA bilaterally, respirations unlabored on RA Abdomen: soft, non-distended Extremities: no edema b/l lower extremities  Dialysis Access:  LUE AVF + t/b    Additional Objective Labs: Basic Metabolic Panel: Recent Labs  Lab 04/08/24 0817 04/09/24 0346 04/10/24 0701  NA 135 135 134*  K 5.0 4.7 4.8  CL 90* 91* 90*  CO2 29 29 22   GLUCOSE 165* 124* 124*  BUN 45* 45* 65*  CREATININE 8.66* 8.11* 11.20*  CALCIUM  10.0 9.8 9.7  PHOS 8.7* 7.6* 8.6*   Liver Function Tests: Recent Labs  Lab 04/08/24 0817 04/09/24 0346 04/10/24 0701  AST 13* 14* 14*  ALT 6 <5 <5  ALKPHOS 126 117 115  BILITOT 0.4 0.3 0.3  PROT 8.3* 7.7 7.7  ALBUMIN 4.1 3.8 3.8   No results for input(s): LIPASE, AMYLASE in the last 168 hours. CBC: Recent Labs  Lab 04/06/24 1547 04/06/24 1648 04/07/24 0534 04/08/24 0817 04/09/24 0346 04/10/24 0701  WBC 9.7  --  8.7 10.2 10.9* 10.9*  NEUTROABS  --   --   --  7.0 7.2 7.9*  HGB 11.1*   < > 10.1* 12.1* 11.5* 11.6*  HCT 33.5*   < > 31.1* 37.6* 35.7* 35.6*  MCV 94.6  --  94.8 96.4 94.9 94.4  PLT 410*  --  384 455* 422* 445*   < > = values in this interval not displayed.   Blood Culture    Component Value Date/Time   SDES BLOOD RIGHT ANTECUBITAL 11/30/2022 1726   SPECREQUEST  11/30/2022 1726    BOTTLES DRAWN AEROBIC AND ANAEROBIC Blood Culture adequate volume   CULT  11/30/2022 1726    NO GROWTH 5 DAYS Performed at [facility], [address], [place], [place]  [address]    REPTSTATUS 12/05/2022 FINAL 11/30/2022 1726    Cardiac Enzymes: No results for input(s): CKTOTAL, CKMB, CKMBINDEX, TROPONINI in the last 168 hours. CBG: Recent Labs  Lab 04/09/24 0806 04/09/24 1301 04/09/24 1800 04/09/24 2209 04/10/24 0605  GLUCAP 123* 266* 163* 172* 130*   Iron Studies: No results for input(s): IRON, TIBC, TRANSFERRIN, FERRITIN in the last 72 hours. @lablastinr3 @ Studies/Results: ECHOCARDIOGRAM COMPLETE Result Date: 04/08/2024    ECHOCARDIOGRAM REPORT   Patient Name:   [name] Date of Exam: 04/08/2024 Medical Rec #:  [id number]       Height:       67.0 in Accession #:    [id number]      Weight:       238.1 lb Date of Birth:  [birth year]        BSA:          2.178 m Patient Age:    58 years        BP:           105/81 mmHg Patient Gender: M  HR:           72 bpm. Exam Location:  Inpatient Procedure: 2D Echo, Cardiac Doppler and Color Doppler (Both Spectral and Color            Flow Doppler were utilized during procedure). Indications:    Congestive Heart Failure. Pulmonary hypertension.  History:        Patient has prior history of Echocardiogram examinations, most                 recent 06/11/2023. CHF and Cardiomegaly, Arrythmias:Atrial                 Fibrillation, Signs/Symptoms:Shortness of Breath; Risk                 Factors:Hypertension, Diabetes and Sleep Apnea.  Sonographer:    Juliene Rucks Referring Phys: [id number] BERNARD[address] KATHEE COME  Sonographer Comments: Patient is obese. IMPRESSIONS  1. 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  2. Left ventricular ejection fraction, by estimation, is 60 to 65%. The left ventricle has normal function. The left ventricle has no regional wall motion abnormalities. There is severe concentric left ventricular hypertrophy. Left ventricular diastolic  parameters are consistent with Grade I diastolic dysfunction (impaired relaxation). Elevated left atrial pressure.  3. Right ventricular systolic  function is normal. The right ventricular size is normal.  4. The mitral valve is normal in structure. No evidence of mitral valve regurgitation. No evidence of mitral stenosis. Moderate mitral annular calcification.  5. The aortic valve is tricuspid. Aortic valve regurgitation is not visualized. Aortic valve sclerosis is present, with no evidence of aortic valve stenosis.  6. The inferior vena cava is normal in size with greater than 50% respiratory variability, suggesting right atrial pressure of 3 mmHg. FINDINGS  Left Ventricle: Left ventricular ejection fraction, by estimation, is 60 to 65%. The left ventricle has normal function. The left ventricle has no regional wall motion abnormalities. The left ventricular internal cavity size was normal in size. There is  severe concentric left ventricular hypertrophy. Left ventricular diastolic parameters are consistent with Grade I diastolic dysfunction (impaired relaxation). Elevated left atrial pressure. Right Ventricle: The right ventricular size is normal. Right ventricular systolic function is normal. Left Atrium: Left atrial size was normal in size. Right Atrium: Right atrial size was normal in size. Pericardium: There is no evidence of pericardial effusion. Mitral Valve: The mitral valve is normal in structure. Moderate mitral annular calcification. No evidence of mitral valve regurgitation. No evidence of mitral valve stenosis. Tricuspid Valve: The tricuspid valve is normal in structure. Tricuspid valve regurgitation is trivial. No evidence of tricuspid stenosis. Aortic Valve: The aortic valve is tricuspid. Aortic valve regurgitation is not visualized. Aortic valve sclerosis is present, with no evidence of aortic valve stenosis. Pulmonic Valve: The pulmonic valve was normal in structure. Pulmonic valve regurgitation is not visualized. No evidence of pulmonic stenosis. Aorta: The aortic root is normal in size and structure. Venous: The inferior vena cava is  normal in size with greater than 50% respiratory variability, suggesting right atrial pressure of 3 mmHg. IAS/Shunts: The interatrial septum was not well visualized. 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  LEFT VENTRICLE PLAX 2D LVIDd:         4.90 cm   Diastology LVIDs:         3.30 cm   LV e' medial:    4.57 cm/s LV PW:         1.40 cm   LV  E/e' medial:  16.4 LV IVS:        1.70 cm   LV e' lateral:   3.81 cm/s LVOT diam:     2.20 cm   LV E/e' lateral: 19.7 LV SV:         79 LV SV Index:   36 LVOT Area:     3.80 cm  RIGHT VENTRICLE RV S prime:     11.40 cm/s TAPSE (M-mode): 1.5 cm LEFT ATRIUM             Index        RIGHT ATRIUM           Index LA diam:        4.40 cm 2.02 cm/m   RA Area:     10.90 cm LA Vol (A2C):   66.0 ml 30.31 ml/m  RA Volume:   18.60 ml  8.54 ml/m LA Vol (A4C):   59.1 ml 27.14 ml/m LA Biplane Vol: 65.4 ml 30.03 ml/m  AORTIC VALVE LVOT Vmax:   117.00 cm/s LVOT Vmean:  73.300 cm/s LVOT VTI:    0.207 m  AORTA Ao Root diam: 3.20 cm Ao Asc diam:  3.00 cm MITRAL VALVE MV Area (PHT): 2.59 cm     SHUNTS MV Decel Time: 293 msec     Systemic VTI:  0.21 m MV E velocity: 75.10 cm/s   Systemic Diam: 2.20 cm MV A velocity: 104.00 cm/s MV E/A ratio:  0.72 Redell Shallow MD Electronically signed by Redell Shallow MD Signature Date/Time: 04/08/2024/3:50:25 PM    Final    Medications:   apixaban   5 mg Oral BID   arformoterol   15 mcg Nebulization BID   budesonide   0.5 mg Nebulization BID   carvedilol   12.5 mg Oral BID WC   Chlorhexidine  Gluconate Cloth  6 each Topical Q0600   dextromethorphan -guaiFENesin   1 tablet Oral BID   ferric citrate  630 mg Oral TID WC   insulin  aspart  0-5 Units Subcutaneous QHS   insulin  aspart  0-6 Units Subcutaneous TID WC   revefenacin   175 mcg Nebulization Daily   sodium chloride  flush  3 mL Intravenous Q12H    Dialysis Orders:  MWF - AF  (last HD 12/29, full HD - left at 107.4kg) 4:15hr, 400/A1.5, EDW 103kg, 2K/2Ca,  AVF, 15g needles, Heparin  4000 unit bolus - Venofer  50mg  weekly - no ESA - VDRA recently held due to ^ Ca (11.2 on 12/17)  Assessment/Plan: Dyspnea: Progressive, extra HD 12/30 for volume. Developed hypotension so did not tolerate HD Wednesday, meds adjusted as below. Will plan for next HD tomorrow per regular schedule.   Exertional CP: Cardiology involved, suspected to be volume overload/cough related  ESRD:  Usual MWF schedule- see above. Next HD today   Hypertension/volume: BP high initially, home meds resumed but was then hypOtensive, required IV fluids on HD. Hold BP meds prior to dialysis. Hydralazine  and amlodipine  have been stopped  Anemia: Hgb 11.6, no ESA for now.  Metabolic bone disease: Ca ok, Phos high - resumed home binders Dollie)  Lucie Collet, PA-C 04/10/2024, 10:11 AM  [facility] Pager: [phone]   Seen and examined independently.  Agree with note and exam as documented above by physician extender and as noted here.  General adult male in bed in no acute distress HEENT normocephalic atraumatic extraocular movements intact sclera anicteric Neck supple trachea midline Lungs clear to auscultation bilaterally normal work of breathing at rest  Heart S1S2 no rub  Abdomen soft nontender nondistended Extremities no edema  Psych normal mood and affect Neuro - alert and conversant; provides hx and follows commands  Access LUE AVF with bruit and thrill    ESRD on HD - HD per MWF schedule  - he didn't tolerate last HD due to hypotension - wants a kidney - recommended to discuss with outpatient team.  Would need to continue to follow-up with cardiology outpatient for additional evaluation given the TTE findings.  - would note post weight from HD on 1/2 - (hasn't had HD yet)   HTN  - continue coreg  at reduced dose of 12.5 mg BID.  Hold the dose the morning of dialysis (so just take in evenings on dialysis days)  - as above, he has lost a significant  amount of fluid and does not appear to require amlodipine  and hydralazine  any more - these should not be resumed on discharge    Dyspnea - improved - goal to optimize volume status with HD.  Should be easier now that a couple of BP meds are off   Disposition - HD here today and after that is acceptable for discharge from a nephrology standpoint if he tolerates HD  Katheryn JAYSON Saba, MD 04/10/2024  2:01 PM   "

## 2024-04-10 NOTE — Progress Notes (Signed)
 " PROGRESS NOTE    [name]  FMW:[id number] DOB: November 17, 1965 DOA: 04/06/2024 PCP: [name] Glinda DASEN, FNP  Chief Complaint  Patient presents with   Shortness of Breath    Brief Narrative:   [name] is [name] 59 y.o. male with [name] history of ESRD on hemodialysis (MWF), HFpEF, HTN, T2DM, OSA not on CPAP, PAF, right internal jugular vein DVT, asthma, GERD who presented to the [facility] today with shortness of breath during dialysis. Per report, he was hypoxic at dialysis, briefly requiring supplemental oxygen, but was able to be weaned off prior to ED arrival.   Assessment & Plan:   Principal Problem:   Acute on chronic heart failure with preserved ejection fraction (HFpEF) (HCC) Active Problems:   AF (paroxysmal atrial fibrillation) (HCC)   ESRD on dialysis (HCC)   DMII (diabetes mellitus, type 2) (HCC)   Severe uncontrolled hypertension   Anxiety   Sleep apnea   Hypotension  ESRD  Volume Overload Acute on chronic HFpEF/biventricular failure - appreciate renal assistance, s/p dialysis today - volume per renal.  Apparently on transplant list.  - hypotensive during dialysis 12/31, amlodipine  and hydralazine  discontinued - coreg  dose reduced - per discussion with renal, will monitor today, possible d/c today after dialysis, would like him to tolerate dialysis prior to discharge - echo with normal EF, grade 1 diastolic dysfunction, IVC normal with greater than 50% resp variability (cards to consider PYP scan outpatient for concern for amyloid with speckled myocardium), appreciate cardiology assistance.  Considering RHC?  Previous echo with grade II diastolic dysfunction in 06/2023.  Chronic demand myocardial ischemia: Troponin elevated with flat trend despite ESRD and no ongoing chest complaints. Had no reversible abnormalities on recent stress test. Cardiology does not suspect ACS at this time.  - Continue anticoagulation, beta blocker, not on statin   HTN: ?adherence to outpatient  regimen.  - coreg  dose reduced - amlodipine  and hydralazine  on hold   T2DM: Recent HbA1c 7.1%.  - Don't see any active orders for medications.  - SSI while hospitalized.  - No plan to start statin per outpatient cardiology.   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  - Continue eliquis , now down to 5mg  dose   PAF: None currently detected - Continue routine monitoring with ILR per Dr. Inocencio.   OSA: Nonadherent to CPAP.  - Counseling provided, declines this while inpatient.   Medication nonadherence: This has been mentioned in prior notes and dispense history shows many medications that were dispensed but never picked up by the patient.  - Will need to reinforce this going forward.     DVT prophylaxis: eliquis  Code Status: full Family Communication: none Disposition:   Status is: Observation The patient remains OBS appropriate and will d/c before 2 midnights.   Consultants:  Cardiology nephrology  Procedures:  none  Antimicrobials:  Anti-infectives (From admission, onward)    None       Subjective:  No new complaints  Objective: Vitals:   04/10/24 0727 04/10/24 0818 04/10/24 0819 04/10/24 1109  BP: 129/82   119/74  Pulse: 74 76  67  Resp: 11 13  19   Temp: (!) 97.3 F (36.3 C)   98 F (36.7 C)  TempSrc: Oral   Oral  SpO2: 93% 95% 95% 93%  Weight:      Height:        Intake/Output Summary (Last 24 hours) at 04/10/2024 1508 Last data filed at 04/10/2024 0508 Gross per 24 hour  Intake  300 ml  Output 4 ml  Net 296 ml   Filed Weights   04/08/24 1022 04/09/24 1937 04/10/24 0501  Weight: 108 kg 104.3 kg 103.7 kg    Examination:  General: No acute distress. Cardiovascular: RRR Lungs: unlabored Neurological: Alert and oriented 3. Moves all extremities 4 with equal strength. Cranial nerves II through XII grossly intact. Extremities: No clubbing or cyanosis. No edema.   Data Reviewed: I have personally reviewed following labs and imaging  studies  CBC: Recent Labs  Lab 04/06/24 1547 04/06/24 1648 04/07/24 0534 04/08/24 0817 04/09/24 0346 04/10/24 0701  WBC 9.7  --  8.7 10.2 10.9* 10.9*  NEUTROABS  --   --   --  7.0 7.2 7.9*  HGB 11.1* 11.6* 10.1* 12.1* 11.5* 11.6*  HCT 33.5* 34.0* 31.1* 37.6* 35.7* 35.6*  MCV 94.6  --  94.8 96.4 94.9 94.4  PLT 410*  --  384 455* 422* 445*    Basic Metabolic Panel: Recent Labs  Lab 04/06/24 1547 04/06/24 1648 04/07/24 0534 04/08/24 0817 04/09/24 0346 04/10/24 0701  NA 136 134* 135 135 135 134*  K 4.4 4.1 4.4 5.0 4.7 4.8  CL 91*  --  92* 90* 91* 90*  CO2 29  --  29 29 29 22   GLUCOSE 170*  --  197* 165* 124* 124*  BUN 43*  --  53* 45* 45* 65*  CREATININE 8.51*  --  10.00* 8.66* 8.11* 11.20*  CALCIUM  9.5  --  9.5 10.0 9.8 9.7  MG  --   --   --  2.8* 2.6* 2.8*  PHOS  --   --  8.6* 8.7* 7.6* 8.6*    GFR: Estimated Creatinine Clearance: 8.3 mL/min ([name]) (by C-G formula based on SCr of 11.2 mg/dL (H)).  Liver Function Tests: Recent Labs  Lab 04/07/24 0534 04/08/24 0817 04/09/24 0346 04/10/24 0701  AST  --  13* 14* 14*  ALT  --  6 <5 <5  ALKPHOS  --  126 117 115  BILITOT  --  0.4 0.3 0.3  PROT  --  8.3* 7.7 7.7  ALBUMIN 3.7 4.1 3.8 3.8    CBG: Recent Labs  Lab 04/09/24 1301 04/09/24 1800 04/09/24 2209 04/10/24 0605 04/10/24 1111  GLUCAP 266* 163* 172* 130* 233*     Recent Results (from the past 240 hours)  Resp panel by RT-PCR (RSV, Flu [name], Covid) Anterior Nasal Swab     Status: None   Collection Time: 04/06/24  7:40 PM   Specimen: Anterior Nasal Swab  Result Value Ref Range Status   SARS Coronavirus 2 by RT PCR NEGATIVE NEGATIVE Final   Influenza Estle Sabella by PCR NEGATIVE NEGATIVE Final   Influenza B by PCR NEGATIVE NEGATIVE Final    Comment: (NOTE) The Xpert Xpress SARS-CoV-2/FLU/RSV plus assay is intended as an aid in the diagnosis of influenza from Nasopharyngeal swab specimens and should not be used as [name] sole basis for treatment. Nasal washings  and aspirates are unacceptable for Xpert Xpress SARS-CoV-2/FLU/RSV testing.  Fact Sheet for Patients: bloggercourse.com  Fact Sheet for Healthcare Providers: seriousbroker.it  This test is not yet approved or cleared by the [place]  FDA and has been authorized for detection and/or diagnosis of SARS-CoV-2 by FDA under an Emergency Use Authorization (EUA). This EUA will remain in effect (meaning this test can be used) for the duration of the COVID-19 declaration under Section 564(b)(1) of the Act, 21 U.S.C. section 360bbb-3(b)(1), unless the authorization is terminated or revoked.  Resp Syncytial Virus by PCR NEGATIVE NEGATIVE Final    Comment: (NOTE) Fact Sheet for Patients: bloggercourse.com  Fact Sheet for Healthcare Providers: seriousbroker.it  This test is not yet approved or cleared by the [place]  FDA and has been authorized for detection and/or diagnosis of SARS-CoV-2 by FDA under an Emergency Use Authorization (EUA). This EUA will remain in effect (meaning this test can be used) for the duration of the COVID-19 declaration under Section 564(b)(1) of the Act, 21 U.S.C. section 360bbb-3(b)(1), unless the authorization is terminated or revoked.  Performed at [facility], [address] [place], [place] [address]   MRSA Next Gen by PCR, Nasal     Status: None   Collection Time: 04/09/24 10:12 PM   Specimen: Nasal Mucosa; Nasal Swab  Result Value Ref Range Status   MRSA by PCR Next Gen NOT DETECTED NOT DETECTED Final    Comment: (NOTE) The GeneXpert MRSA Assay (FDA approved for NASAL specimens only), is one component of Virgina Deakins comprehensive MRSA colonization surveillance program. It is not intended to diagnose MRSA infection nor to guide or monitor treatment for MRSA infections. Test performance is not FDA approved in patients less than 79  years old. Performed at [facility], [address], [place], [place] [address]          Radiology Studies: ECHOCARDIOGRAM COMPLETE Result Date: 04/08/2024    ECHOCARDIOGRAM REPORT   Patient Name:   [name] Date of Exam: 04/08/2024 Medical Rec #:  [id number]       Height:       67.0 in Accession #:    [id number]      Weight:       238.1 lb Date of Birth:  Sep 27, 1965        BSA:          2.178 m Patient Age:    58 years        BP:           105/81 mmHg Patient Gender: M               HR:           72 bpm. Exam Location:  Inpatient Procedure: 2D Echo, Cardiac Doppler and Color Doppler (Both Spectral and Color            Flow Doppler were utilized during procedure). Indications:    Congestive Heart Failure. Pulmonary hypertension.  History:        Patient has prior history of Echocardiogram examinations, most                 recent 06/11/2023. CHF and Cardiomegaly, Arrythmias:Atrial                 Fibrillation, Signs/Symptoms:Shortness of Breath; Risk                 Factors:Hypertension, Diabetes and Sleep Apnea.  Sonographer:    Juliene Rucks Referring Phys: [id number] BERNARDINO KATHEE COME  Sonographer Comments: Patient is obese. IMPRESSIONS  1. 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  2. Left ventricular ejection fraction, by estimation, is 60 to 65%. The left ventricle has normal function. The left ventricle has no regional wall motion abnormalities. There is severe concentric left ventricular hypertrophy. Left ventricular diastolic  parameters are consistent with Grade I diastolic dysfunction (impaired relaxation). Elevated left atrial pressure.  3. Right ventricular systolic function is normal. The right ventricular size is normal.  4. The mitral valve is  normal in structure. No evidence of mitral valve regurgitation. No evidence of mitral stenosis. Moderate mitral annular calcification.  5. The aortic valve is tricuspid. Aortic valve regurgitation is not visualized. Aortic valve  sclerosis is present, with no evidence of aortic valve stenosis.  6. The inferior vena cava is normal in size with greater than 50% respiratory variability, suggesting right atrial pressure of 3 mmHg. FINDINGS  Left Ventricle: Left ventricular ejection fraction, by estimation, is 60 to 65%. The left ventricle has normal function. The left ventricle has no regional wall motion abnormalities. The left ventricular internal cavity size was normal in size. There is  severe concentric left ventricular hypertrophy. Left ventricular diastolic parameters are consistent with Grade I diastolic dysfunction (impaired relaxation). Elevated left atrial pressure. Right Ventricle: The right ventricular size is normal. Right ventricular systolic function is normal. Left Atrium: Left atrial size was normal in size. Right Atrium: Right atrial size was normal in size. Pericardium: There is no evidence of pericardial effusion. Mitral Valve: The mitral valve is normal in structure. Moderate mitral annular calcification. No evidence of mitral valve regurgitation. No evidence of mitral valve stenosis. Tricuspid Valve: The tricuspid valve is normal in structure. Tricuspid valve regurgitation is trivial. No evidence of tricuspid stenosis. Aortic Valve: The aortic valve is tricuspid. Aortic valve regurgitation is not visualized. Aortic valve sclerosis is present, with no evidence of aortic valve stenosis. Pulmonic Valve: The pulmonic valve was normal in structure. Pulmonic valve regurgitation is not visualized. No evidence of pulmonic stenosis. Aorta: The aortic root is normal in size and structure. Venous: The inferior vena cava is normal in size with greater than 50% respiratory variability, suggesting right atrial pressure of 3 mmHg. IAS/Shunts: The interatrial septum was not well visualized. 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  LEFT VENTRICLE PLAX 2D LVIDd:         4.90 cm   Diastology LVIDs:          3.30 cm   LV e' medial:    4.57 cm/s LV PW:         1.40 cm   LV E/e' medial:  16.4 LV IVS:        1.70 cm   LV e' lateral:   3.81 cm/s LVOT diam:     2.20 cm   LV E/e' lateral: 19.7 LV SV:         79 LV SV Index:   36 LVOT Area:     3.80 cm  RIGHT VENTRICLE RV S prime:     11.40 cm/s TAPSE (M-mode): 1.5 cm LEFT ATRIUM             Index        RIGHT ATRIUM           Index LA diam:        4.40 cm 2.02 cm/m   RA Area:     10.90 cm LA Vol (A2C):   66.0 ml 30.31 ml/m  RA Volume:   18.60 ml  8.54 ml/m LA Vol (A4C):   59.1 ml 27.14 ml/m LA Biplane Vol: 65.4 ml 30.03 ml/m  AORTIC VALVE LVOT Vmax:   117.00 cm/s LVOT Vmean:  73.300 cm/s LVOT VTI:    0.207 m  AORTA Ao Root diam: 3.20 cm Ao Asc diam:  3.00 cm MITRAL VALVE MV Area (PHT): 2.59 cm     SHUNTS MV Decel Time: 293 msec     Systemic VTI:  0.21 m MV E velocity: 75.10  cm/s   Systemic Diam: 2.20 cm MV Gabryela Kimbrell velocity: 104.00 cm/s MV E/Jood Retana ratio:  0.72 Redell Shallow MD Electronically signed by Redell Shallow MD Signature Date/Time: 04/08/2024/3:50:25 PM    Final         Scheduled Meds:  apixaban   5 mg Oral BID   arformoterol   15 mcg Nebulization BID   budesonide   0.5 mg Nebulization BID   carvedilol   12.5 mg Oral BID WC   Chlorhexidine  Gluconate Cloth  6 each Topical Q0600   dextromethorphan -guaiFENesin   1 tablet Oral BID   ferric citrate  630 mg Oral TID WC   insulin  aspart  0-5 Units Subcutaneous QHS   insulin  aspart  0-6 Units Subcutaneous TID WC   revefenacin   175 mcg Nebulization Daily   sodium chloride  flush  3 mL Intravenous Q12H   Continuous Infusions:   LOS: 0 days    Time spent: over 30 min     Meliton Monte, MD [facility]   To contact the attending provider between 7A-7P or the covering provider during after hours 7P-7A, please log into the web site [email or web address] and access using universal [place] password for that web site. If you do not have the password, please call the hospital operator.  04/10/2024, 3:08 PM     "

## 2024-04-10 NOTE — Plan of Care (Signed)
  Problem: Education: Goal: Knowledge of General Education information will improve Description: Including pain rating scale, medication(s)/side effects and non-pharmacologic comfort measures Outcome: Progressing   Problem: Health Behavior/Discharge Planning: Goal: Ability to manage health-related needs will improve Outcome: Progressing   Problem: Clinical Measurements: Goal: Ability to maintain clinical measurements within normal limits will improve Outcome: Progressing   Problem: Activity: Goal: Risk for activity intolerance will decrease Outcome: Progressing   Problem: Nutrition: Goal: Adequate nutrition will be maintained Outcome: Progressing   Problem: Elimination: Goal: Will not experience complications related to bowel motility Outcome: Progressing Goal: Will not experience complications related to urinary retention Outcome: Progressing   Problem: Pain Managment: Goal: General experience of comfort will improve and/or be controlled Outcome: Progressing   Problem: Safety: Goal: Ability to remain free from injury will improve Outcome: Progressing   Problem: Skin Integrity: Goal: Risk for impaired skin integrity will decrease Outcome: Progressing

## 2024-04-10 NOTE — Progress Notes (Signed)
"  °  Progress Note  Patient Name: [name] Date of Encounter: 04/10/2024 [facility] Cardiologist: Shelda Bruckner, MD   Interval Summary   Amlodipine /hydralazine  stopped to help support blood pressure during hemodialysis.  Getting breathing treatment. Doing well.   Vital Signs Vitals:   04/09/24 1914 04/09/24 1937 04/09/24 2333 04/10/24 0501  BP: 102/64 97/68 (!) 148/77 (!) 152/78  Pulse: 65 66 65 68  Resp: 14 14 14 17   Temp: 97.6 F (36.4 C) (!) 97.4 F (36.3 C) 97.7 F (36.5 C) 97.8 F (36.6 C)  TempSrc: Oral Oral Oral Oral  SpO2: 98% 98% 90% 93%  Weight:  104.3 kg  103.7 kg  Height:  5' 7 (1.702 m)      Intake/Output Summary (Last 24 hours) at 04/10/2024 0650 Last data filed at 04/10/2024 9491 Gross per 24 hour  Intake 300 ml  Output 4 ml  Net 296 ml      04/10/2024    5:01 AM 04/09/2024    7:37 PM 04/08/2024   12:32 PM  Last 3 Weights  Weight (lbs) 228 lb 9.9 oz 229 lb 14.4 oz --  Weight (kg) 103.7 kg 104.282 kg --      Telemetry/ECG  Normal sinus rhythm- Personally Reviewed  Physical Exam  GEN: No acute distress.   Neck: No JVD Cardiac: RRR, no murmurs, rubs, or gallops.  Respiratory: Clear to auscultation bilaterally. GI: Soft, nontender, non-distended  MS: No edema  Assessment & Plan   59 year old on hemodialysis end-stage renal disease with acute on chronic diastolic heart failure recent hypotension during hemodialysis, remote paroxysmal atrial fibrillation with loop recorder  Hypotension during dialysis - Amlodipine  and hydralazine  stopped - Continuing with lower dose carvedilol  12.5 BID.  Acute on chronic diastolic heart failure - Echo with severe LVH grade 1 diastolic dysfunction normal RV, EF 60 to 65%.  Reassuring.  Volume management per HD.  Chest pain - Suspect secondary to volume overload.  Cath in 2022 without any obstructive disease, recent nuclear stress test negative with no ischemia.  Chest pain worse with prolonged  coughing.  Paroxysmal atrial fibrillation - No recurrence 6 months implantable loop - Continuing with anticoagulation secondary to right IJ DVT in November 2025.  Telemetry here clear.  Normal sinus rhythm  OK to DC after HD  For questions or updates, please contact [facility] Please consult [email or web address] for contact info under         Signed, Oneil Parchment, MD   "

## 2024-04-10 NOTE — TOC Initial Note (Signed)
 Transition of Care [facility]) - Initial/Assessment Note    Patient Details  Name: [name] MRN: [id number] Date of Birth: [birth year]  [facility] CM/SW Contact:    Lauraine FORBES Saa, LCSWA Phone Number: 04/10/2024, 10:21 AM  Clinical Narrative:                  10:22 AM Per chart review, patient resides at home with spouse where he stated that he receives [facility] services through [facility]. Patient has a PCP and insurance on file. Patient stated November 2025 that he did not use DME. Patient reported in March 2025 that he uses a nebulizer machine at home. Patient has DME (RW, cane, CPAP, manual wheelchair) history with [facility] from 2016, 2019, and 2021. Patient's preferred pharmacy listed is [facility] [phone] [place]. TOC will continue to follow.  Expected Discharge Plan: Home w Home Health Services Barriers to Discharge: Continued Medical Work up   Patient Goals and CMS Choice            Expected Discharge Plan and Services   Discharge Planning Services: CM Consult Post Acute Care Choice: Home Health Living arrangements for the past 2 months: Single Family Home                                      Prior Living Arrangements/Services Living arrangements for the past 2 months: Single Family Home Lives with:: Spouse Patient language and need for interpreter reviewed:: Yes        Need for Family Participation in Patient Care: No (Comment) Care giver support system in place?: Yes (comment)   Criminal Activity/Legal Involvement Pertinent to Current Situation/Hospitalization: No - Comment as needed  Activities of Daily Living   ADL Screening (condition at time of admission) Independently performs ADLs?: Yes (appropriate for developmental age) Is the patient deaf or have difficulty hearing?: No Does the patient have difficulty seeing, even when wearing glasses/contacts?: No Does the patient have difficulty concentrating,  remembering, or making decisions?: No  Permission Sought/Granted Permission sought to share information with : Facility Medical Sales Representative, Family Supports Permission granted to share information with : No (Contact information on chart)  Share Information with NAME: Cloyd Louder  Permission granted to share info w AGENCY: [facility]  Permission granted to share info w Relationship: Spouse  Permission granted to share info w Contact Information: [phone]  Emotional Assessment       Orientation: : Oriented to Self, Oriented to Place, Oriented to  Time, Oriented to Situation Alcohol / Substance Use: Not Applicable Psych Involvement: No (comment)  Admission diagnosis:  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] COPD exacerbation (HCC) [J44.1] Acute on chronic heart failure with preserved ejection fraction (HFpEF) (HCC) [I50.33] Patient Active Problem List   Diagnosis Date Noted   Hypotension 04/09/2024   Acute on chronic heart failure with preserved ejection fraction (HFpEF) (HCC) 04/06/2024   SOB (shortness of breath) 02/18/2024   Volume overload 02/17/2024   Dialysis AV fistula malfunction 02/17/2024   Moderate protein-calorie malnutrition 02/17/2024   Severe hypertension 06/11/2023   Anemia in chronic kidney disease (CKD) 04/09/2023   Prolonged QT interval 12/02/2022   Leg wound, left 11/30/2022   Acute hypoxic respiratory failure (HCC) 05/22/2022   Chronic heart failure with preserved ejection fraction (HCC) 05/22/2022   Tinea pedis of both feet 11/22/2020   Cardiomegaly 02/02/2020   Chronic ulcer of right  leg (HCC) 10/25/2019   AF (paroxysmal atrial fibrillation) (HCC) 10/25/2019   Bilateral lower extremity edema 10/06/2019   Eustachian tube dysfunction, right 10/06/2019   Chronic anticoagulation 12/11/2018   Myocardial injury 11/02/2018   Diabetic polyneuropathy associated with type 2 diabetes mellitus (HCC) 11/13/2017   ESRD on dialysis (HCC) 07/28/2017    Microalbuminuria 01/20/2015   DMII (diabetes mellitus, type 2) (HCC) 12/28/2014   Recurrent major depressive disorder, in partial remission 12/28/2014   Anxiety 12/23/2014   Class 2 obesity 05/14/2012   Sleep apnea 05/14/2012   Class 2 severe obesity due to excess calories with serious comorbidity and body mass index (BMI) of 38.0 to 38.9 in adult 05/14/2012   Severe uncontrolled hypertension 03/28/2011   PCP:  Rik Glinda DASEN, FNP Pharmacy:   [facility] [address] [place] - [address] [place] [address] Phone: [phone] Fax: [phone]     Social Drivers of Health (SDOH) Social History: SDOH Screenings   Food Insecurity: No Food Insecurity (04/09/2024)  Housing: Low Risk (04/09/2024)  Transportation Needs: No Transportation Needs (04/09/2024)  Utilities: Not At Risk (04/09/2024)  Social Connections: Unknown (08/22/2021)   Received from [facility]  Tobacco Use: Low Risk (04/06/2024)   SDOH Interventions:     Readmission Risk Interventions    06/13/2023    2:49 PM 01/24/2023    3:01 PM  Readmission Risk Prevention Plan  Transportation Screening Complete Complete  PCP or Specialist Appt within 3-5 Days  Complete  HRI or Home Care Consult  Complete  Palliative Care Screening  Not Applicable  Medication Review (RN Care Manager) Complete Complete  PCP or Specialist appointment within 3-5 days of discharge Complete   HRI or Home Care Consult Complete   SW Recovery Care/Counseling Consult Complete   Palliative Care Screening Complete   Skilled Nursing Facility Not Applicable

## 2024-04-11 DIAGNOSIS — I5033 Acute on chronic diastolic (congestive) heart failure: Secondary | ICD-10-CM | POA: Diagnosis not present

## 2024-04-11 DIAGNOSIS — I959 Hypotension, unspecified: Secondary | ICD-10-CM | POA: Diagnosis not present

## 2024-04-11 LAB — BASIC METABOLIC PANEL WITH GFR
Anion gap: 20 — ABNORMAL HIGH (ref 5–15)
BUN: 74 mg/dL — ABNORMAL HIGH (ref 6–20)
CO2: 24 mmol/L (ref 22–32)
Calcium: 9.5 mg/dL (ref 8.9–10.3)
Chloride: 91 mmol/L — ABNORMAL LOW (ref 98–111)
Creatinine, Ser: 13.7 mg/dL — ABNORMAL HIGH (ref 0.61–1.24)
GFR, Estimated: 4 mL/min — ABNORMAL LOW
Glucose, Bld: 160 mg/dL — ABNORMAL HIGH (ref 70–99)
Potassium: 4.5 mmol/L (ref 3.5–5.1)
Sodium: 135 mmol/L (ref 135–145)

## 2024-04-11 LAB — CBC
HCT: 34.7 % — ABNORMAL LOW (ref 39.0–52.0)
Hemoglobin: 11.3 g/dL — ABNORMAL LOW (ref 13.0–17.0)
MCH: 30.8 pg (ref 26.0–34.0)
MCHC: 32.6 g/dL (ref 30.0–36.0)
MCV: 94.6 fL (ref 80.0–100.0)
Platelets: 423 K/uL — ABNORMAL HIGH (ref 150–400)
RBC: 3.67 MIL/uL — ABNORMAL LOW (ref 4.22–5.81)
RDW: 13.6 % (ref 11.5–15.5)
WBC: 9.5 K/uL (ref 4.0–10.5)
nRBC: 0 % (ref 0.0–0.2)

## 2024-04-11 LAB — GLUCOSE, CAPILLARY
Glucose-Capillary: 130 mg/dL — ABNORMAL HIGH (ref 70–99)
Glucose-Capillary: 114 mg/dL — ABNORMAL HIGH (ref 70–99)

## 2024-04-11 LAB — PHOSPHORUS: Phosphorus: 9.3 mg/dL — ABNORMAL HIGH (ref 2.5–4.6)

## 2024-04-11 LAB — MAGNESIUM: Magnesium: 2.9 mg/dL — ABNORMAL HIGH (ref 1.7–2.4)

## 2024-04-11 MED ORDER — GABAPENTIN 600 MG PO TABS: 300.0000 mg | ORAL_TABLET | ORAL | Status: AC

## 2024-04-11 MED ORDER — CARVEDILOL 12.5 MG PO TABS: 12.5000 mg | ORAL_TABLET | Freq: Two times a day (BID) | ORAL | 0 refills | Status: DC

## 2024-04-11 MED ORDER — APIXABAN 5 MG PO TABS: 5.0000 mg | ORAL_TABLET | Freq: Two times a day (BID) | ORAL | Status: AC

## 2024-04-11 NOTE — Discharge Planning (Signed)
 [facility] Patient Discharge Orders - [facility] CLINIC: AF  Patient's name: [name] Admit/DC Dates: 04/06/2024 - 04/11/2024  DISCHARGE DIAGNOSES: Dyspnea   Hypotension (got too dry, needed fludis)  HD ORDER CHANGES: Heparin  change: no EDW Change: yes New EDW: 104kg [place] Change: no  ANEMIA MANAGEMENT: Aranesp : Given: no    ESA dose for discharge: Per protocol IV Iron  dose at discharge: Per protocol Transfusion: Given: no  BONE/MINERAL MEDICATIONS: Hectorol /Calcitriol  change: no Sensipar /Parsabiv change: no  ACCESS INTERVENTION/CHANGE: no Details:   RECENT LABS: Recent Labs  Lab 04/10/24 0701 04/11/24 0214  HGB 11.6* 11.3*  NA 134* 135  K 4.8 4.5  CALCIUM  9.7 9.5  PHOS 8.6* 9.3*  ALBUMIN 3.8  --     IV ANTIBIOTICS: no Details:  OTHER ANTICOAGULATION: yes Details: on Eliquis   OTHER/APPTS/LAB ORDERS: - Apparently plan is for PYP scan as outpatient for cardiac amyloid - Amlodipine  and hydralazine  held, now on Coreg  12.5mg  BID only for BP  D/C Meds to be reconciled by nurse after every discharge.  Completed By: Izetta Boehringer, PA-C [facility] Pager [phone]   Reviewed by: MD:______ RN_______

## 2024-04-11 NOTE — Discharge Summary (Signed)
 Physician Discharge Summary  [name] [place] FMW:[id number] DOB: 05-20-1965 DOA: 04/06/2024  PCP: Rik Glinda DASEN, FNP  Admit date: 04/06/2024 Discharge date: 04/11/2024  Time spent: 40 minutes  Recommendations for Outpatient Follow-up:  Follow outpatient CBC/CMP  Follow blood pressure outpatient - amlodipine  and hydral discontinued Follow gabapentin  dose outpatient Outpatient PYP scan per cards  Discharge Diagnoses:  Principal Problem:   Acute on chronic heart failure with preserved ejection fraction (HFpEF) (HCC) Active Problems:   AF (paroxysmal atrial fibrillation) (HCC)   ESRD on dialysis (HCC)   DMII (diabetes mellitus, type 2) (HCC)   Severe uncontrolled hypertension   Anxiety   Sleep apnea   Hypotension   Discharge Condition: stable  Diet recommendation: renal/carb mod  Filed Weights   04/11/24 0556 04/11/24 1055 04/11/24 1410  Weight: 103.8 kg 104.7 kg 103.8 kg    History of present illness:   [name] is [name] 59 y.o. male with Baily Serpe history of ESRD on hemodialysis (MWF), HFpEF, HTN, T2DM, OSA not on CPAP, PAF, right internal jugular vein DVT, asthma, GERD who presented to the [facility] today with shortness of breath during dialysis. Per report, he was hypoxic at dialysis, briefly requiring supplemental oxygen, but was able to be weaned off prior to ED arrival.   He was dialyzed x3.  Hospitalization complicated by hypotension during dialysis.  BP meds adjusted.    Stable for discharge 1/3.  See below for additional details.   Hospital Course:  Assessment and Plan:  ESRD  Volume Overload Acute on chronic HFpEF/biventricular failure - appreciate renal assistance, s/p dialysis today - volume per renal.  Apparently on transplant list.  - hypotensive during dialysis 12/31, amlodipine  and hydralazine  discontinued - coreg  dose reduced - doing better today, stable after dialysis for discharge - echo with normal EF, grade 1 diastolic dysfunction, IVC normal with greater  than 50% resp variability (cards to consider PYP scan outpatient for concern for amyloid with speckled myocardium), appreciate cardiology assistance.  Considering RHC?  Previous echo with grade II diastolic dysfunction in 06/2023.   Chronic demand myocardial ischemia: Troponin elevated with flat trend despite ESRD and no ongoing chest complaints. Had no reversible abnormalities on recent stress test. Cardiology does not suspect ACS at this time.  - Continue anticoagulation, beta blocker, not on statin   HTN: ?adherence to outpatient regimen.  - coreg  dose reduced - amlodipine  and hydralazine  on hold - follow outpatient blood pressures, note cardiology recommendation for hydral prn (I discussed this with Dr. Floretta, will hold off on prn BP med for now)   T2DM: Recent HbA1c 7.1%.  - Don't see any active orders for medications.  - SSI while hospitalized.  - No plan to start statin per outpatient cardiology.   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  - Continue eliquis , now down to 5mg   BID dosing   PAF: None currently detected - Continue routine monitoring with ILR per Dr. Inocencio.    OSA: Nonadherent to CPAP.  - Counseling provided, declines this while inpatient.    Medication nonadherence: encourage medication adherence     Procedures: Echo IMPRESSIONS     1. 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   2. Left ventricular ejection fraction, by estimation, is 60 to 65%. The  left ventricle has normal function. The left ventricle has no regional  wall motion abnormalities. There is severe concentric left ventricular  hypertrophy. Left ventricular diastolic   parameters are consistent with Grade I diastolic dysfunction (impaired  relaxation).  Elevated left atrial pressure.   3. Right ventricular systolic function is normal. The right ventricular  size is normal.   4. The mitral valve is normal in structure. No evidence of mitral valve  regurgitation. No  evidence of mitral stenosis. Moderate mitral annular  calcification.   5. The aortic valve is tricuspid. Aortic valve regurgitation is not  visualized. Aortic valve sclerosis is present, with no evidence of aortic  valve stenosis.   6. The inferior vena cava is normal in size with greater than 50%  respiratory variability, suggesting right atrial pressure of 3 mmHg.    Consultations: Renal cardiology  Discharge Exam: Vitals:   04/11/24 1410 04/11/24 1526  BP: (!) 143/89   Pulse: 73 75  Resp: 18 17  Temp: 97.7 F (36.5 C) 97.6 F (36.4 C)  SpO2: 99%    No new complaints  General: No acute distress. Cardiovascular: RRR Lungs: unlabored Neurological: Alert and oriented 3. Moves all extremities 4 with equal strength. Cranial nerves II through XII grossly intact. Extremities: No clubbing or cyanosis. No edema.   Discharge Instructions   Discharge Instructions     Call MD for:  difficulty breathing, headache or visual disturbances   Complete by: As directed    Call MD for:  extreme fatigue   Complete by: As directed    Call MD for:  hives   Complete by: As directed    Call MD for:  persistant dizziness or light-headedness   Complete by: As directed    Call MD for:  persistant nausea and vomiting   Complete by: As directed    Call MD for:  redness, tenderness, or signs of infection (pain, swelling, redness, odor or green/yellow discharge around incision site)   Complete by: As directed    Call MD for:  severe uncontrolled pain   Complete by: As directed    Call MD for:  temperature >100.4   Complete by: As directed    Discharge instructions   Complete by: As directed    You were seen for issues with dialysis.    You had hypoxia (low oxygen levels).  You were dialyzed here in the hospital.  We've made adjustments to your blood pressure medicines.  We've stopped your amlodipine  and hydralazine  due to low blood pressure here.    We'll continue your coreg  at Jasmeen Fritsch lower  dose.  Follow your blood pressures with your outpatient providers.  Hold your coreg  on the morning of dialysis.   Your gabapentin  dose should be no higher than 300 mg after dialysis Monday, Wednesday, and Friday.  Follow up with your kidney doctor regarding further dose adjustments.  Follow up with cardiology regarding Stefanee Mckell PYP scan given your ultrasound findings.  Return for new, recurrent, or worsening symptoms.  Please ask your PCP to request records from this hospitalization so they know what was done and what the next steps will be.   Increase activity slowly   Complete by: As directed       Allergies as of 04/11/2024       Reactions   Hydrocodone  Itching   Oxycodone  Itching        Medication List     STOP taking these medications    amLODipine  10 MG tablet Commonly known as: NORVASC    hydrALAZINE  50 MG tablet Commonly known as: APRESOLINE        TAKE these medications    acetaminophen  325 MG tablet Commonly known as: TYLENOL  Take 2 tablets (650 mg total) by  mouth every 6 (six) hours as needed for mild pain (pain score 1-3) (or Fever >/= 101).   albuterol  108 (90 Base) MCG/ACT inhaler Commonly known as: Ventolin  HFA Inhale 2 puffs into the lungs every 6 (six) hours as needed for wheezing or shortness of breath.   albuterol  (2.5 MG/3ML) 0.083% nebulizer solution Commonly known as: PROVENTIL  Take 3 mLs (2.5 mg total) by nebulization every 6 (six) hours as needed for wheezing or shortness of breath.   apixaban  5 MG Tabs tablet Commonly known as: ELIQUIS  Take 1 tablet (5 mg total) by mouth 2 (two) times daily. What changed: See the new instructions.   arformoterol  15 MCG/2ML Nebu Commonly known as: BROVANA  Take 2 mLs (15 mcg total) by nebulization 2 (two) times daily.   Auryxia  1 GM 210 MG(Fe) tablet Generic drug: ferric citrate  Take 420 mg by mouth 3 (three) times daily with meals.   budesonide  0.5 MG/2ML nebulizer solution Commonly known as:  Pulmicort  Take 2 mLs (0.5 mg total) by nebulization 2 (two) times daily.   busPIRone  5 MG tablet Commonly known as: BUSPAR  Take 5 mg by mouth 2 (two) times daily.   calcitRIOL  0.5 MCG capsule Commonly known as: ROCALTROL  Take 0.5 mcg by mouth daily.   camphor-menthol  lotion Commonly known as: SARNA Apply topically 2 (two) times daily. What changed:  how much to take when to take this reasons to take this   carvedilol  12.5 MG tablet Commonly known as: COREG  Take 1 tablet (12.5 mg total) by mouth 2 (two) times daily with Jamorion Gomillion meal. Follow with your PCP or cardiologist for refills outpatient.  Hold before dialysis. What changed:  medication strength how much to take additional instructions   diclofenac  Sodium 1 % Gel Commonly known as: VOLTAREN  Apply 4 g topically 4 (four) times daily. What changed:  when to take this reasons to take this   feeding supplement (NEPRO CARB STEADY) Liqd Take 237 mLs by mouth as needed (missed meal during dialysis.).   gabapentin  600 MG tablet Commonly known as: NEURONTIN  Take 0.5 tablets (300 mg total) by mouth every Monday, Wednesday, and Friday at 6 PM. Follow with your renal doctor or PCP for adjustments to your dose. Start taking on: April 13, 2024 What changed:  how much to take when to take this additional instructions   ipratropium 0.03 % nasal spray Commonly known as: ATROVENT  Place 2 sprays into both nostrils every 12 (twelve) hours.   multivitamin Tabs tablet Take 1 tablet by mouth at bedtime.   ondansetron  4 MG disintegrating tablet Commonly known as: ZOFRAN -ODT Take 4 mg by mouth every 8 (eight) hours as needed for nausea or vomiting.   pantoprazole  40 MG tablet Commonly known as: PROTONIX  Take 40 mg by mouth daily.   polyethylene glycol powder 17 GM/SCOOP powder Commonly known as: GLYCOLAX /MIRALAX  Mix as directed and take 1 capful (17 g) by mouth daily. What changed:  when to take this reasons to take this    revefenacin  175 MCG/3ML nebulizer solution Commonly known as: YUPELRI  Take 3 mLs (175 mcg total) by nebulization daily.   VITAMIN D  PO Take 1 tablet by mouth daily.       Allergies[1]  Follow-up Information     Vannie Reche RAMAN, NP Follow up on 04/20/2024.   Specialty: Cardiology Why: 1:55PM. Cardiology follow up Contact information: [address] [place] [address] [phone]         Rik Glinda DASEN, FNP Follow up.   Specialty: Family Medicine Contact information:  [address] [place] [address] [phone]                  The results of significant diagnostics from this hospitalization (including imaging, microbiology, ancillary and laboratory) are listed below for reference.    Significant Diagnostic Studies: ECHOCARDIOGRAM COMPLETE Result Date: 04/08/2024    ECHOCARDIOGRAM REPORT   Patient Name:   [name] Odea Date of Exam: 04/08/2024 Medical Rec #:  [id number]       Height:       67.0 in Accession #:    [id number]      Weight:       238.1 lb Date of Birth:  12/16/1965        BSA:          2.178 m Patient Age:    58 years        BP:           105/81 mmHg Patient Gender: M               HR:           72 bpm. Exam Location:  Inpatient Procedure: 2D Echo, Cardiac Doppler and Color Doppler (Both Spectral and Color            Flow Doppler were utilized during procedure). Indications:    Congestive Heart Failure. Pulmonary hypertension.  History:        Patient has prior history of Echocardiogram examinations, most                 recent 06/11/2023. CHF and Cardiomegaly, Arrythmias:Atrial                 Fibrillation, Signs/Symptoms:Shortness of Breath; Risk                 Factors:Hypertension, Diabetes and Sleep Apnea.  Sonographer:    Juliene Rucks Referring Phys: [id number] BERNARDINO KATHEE COME  Sonographer Comments: Patient is obese. IMPRESSIONS  1. 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  2. Left ventricular ejection fraction, by estimation,  is 60 to 65%. The left ventricle has normal function. The left ventricle has no regional wall motion abnormalities. There is severe concentric left ventricular hypertrophy. Left ventricular diastolic  parameters are consistent with Grade I diastolic dysfunction (impaired relaxation). Elevated left atrial pressure.  3. Right ventricular systolic function is normal. The right ventricular size is normal.  4. The mitral valve is normal in structure. No evidence of mitral valve regurgitation. No evidence of mitral stenosis. Moderate mitral annular calcification.  5. The aortic valve is tricuspid. Aortic valve regurgitation is not visualized. Aortic valve sclerosis is present, with no evidence of aortic valve stenosis.  6. The inferior vena cava is normal in size with greater than 50% respiratory variability, suggesting right atrial pressure of 3 mmHg. FINDINGS  Left Ventricle: Left ventricular ejection fraction, by estimation, is 60 to 65%. The left ventricle has normal function. The left ventricle has no regional wall motion abnormalities. The left ventricular internal cavity size was normal in size. There is  severe concentric left ventricular hypertrophy. Left ventricular diastolic parameters are consistent with Grade I diastolic dysfunction (impaired relaxation). Elevated left atrial pressure. Right Ventricle: The right ventricular size is normal. Right ventricular systolic function is normal. Left Atrium: Left atrial size was normal in size. Right Atrium: Right atrial size was normal in size. Pericardium: There is no evidence of pericardial effusion. Mitral Valve: The mitral valve is normal in structure. Moderate mitral  annular calcification. No evidence of mitral valve regurgitation. No evidence of mitral valve stenosis. Tricuspid Valve: The tricuspid valve is normal in structure. Tricuspid valve regurgitation is trivial. No evidence of tricuspid stenosis. Aortic Valve: The aortic valve is tricuspid. Aortic valve  regurgitation is not visualized. Aortic valve sclerosis is present, with no evidence of aortic valve stenosis. Pulmonic Valve: The pulmonic valve was normal in structure. Pulmonic valve regurgitation is not visualized. No evidence of pulmonic stenosis. Aorta: The aortic root is normal in size and structure. Venous: The inferior vena cava is normal in size with greater than 50% respiratory variability, suggesting right atrial pressure of 3 mmHg. IAS/Shunts: The interatrial septum was not well visualized. 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  LEFT VENTRICLE PLAX 2D LVIDd:         4.90 cm   Diastology LVIDs:         3.30 cm   LV e' medial:    4.57 cm/s LV PW:         1.40 cm   LV E/e' medial:  16.4 LV IVS:        1.70 cm   LV e' lateral:   3.81 cm/s LVOT diam:     2.20 cm   LV E/e' lateral: 19.7 LV SV:         79 LV SV Index:   36 LVOT Area:     3.80 cm  RIGHT VENTRICLE RV S prime:     11.40 cm/s TAPSE (M-mode): 1.5 cm LEFT ATRIUM             Index        RIGHT ATRIUM           Index LA diam:        4.40 cm 2.02 cm/m   RA Area:     10.90 cm LA Vol (A2C):   66.0 ml 30.31 ml/m  RA Volume:   18.60 ml  8.54 ml/m LA Vol (A4C):   59.1 ml 27.14 ml/m LA Biplane Vol: 65.4 ml 30.03 ml/m  AORTIC VALVE LVOT Vmax:   117.00 cm/s LVOT Vmean:  73.300 cm/s LVOT VTI:    0.207 m  AORTA Ao Root diam: 3.20 cm Ao Asc diam:  3.00 cm MITRAL VALVE MV Area (PHT): 2.59 cm     SHUNTS MV Decel Time: 293 msec     Systemic VTI:  0.21 m MV E velocity: 75.10 cm/s   Systemic Diam: 2.20 cm MV Bhavin Monjaraz velocity: 104.00 cm/s MV E/Kristyl Athens ratio:  0.72 Redell Shallow MD Electronically signed by Redell Shallow MD Signature Date/Time: 04/08/2024/3:50:25 PM    Final    DG Chest 2 View Result Date: 04/06/2024 CLINICAL DATA:  Chest pain for 2-3 days. EXAM: CHEST - 2 VIEW COMPARISON:  02/17/2024 FINDINGS: Cardiomegaly is stable. Unchanged mediastinal contours with aortic atherosclerosis. Peribronchial thickening without focal  airspace disease. Trace fluid in the fissures, no sub pulmonic effusion. No pneumothorax. Left chest wall loop recorder. Left subclavian stent. On limited assessment, no acute osseous findings. IMPRESSION: Cardiomegaly. Peribronchial thickening may be bronchitic or congestive. Electronically Signed   By: Andrea Gasman M.D.   On: 04/06/2024 19:08   CUP PACEART REMOTE DEVICE CHECK Result Date: 03/24/2024 ILR summary report received. Battery status OK. Normal device function. No new symptom, tachy, brady, or pause episodes. No new AF episodes. Monthly summary reports and ROV/PRN LA, CVRS   Microbiology: Recent Results (from the past 240 hours)  Resp panel by RT-PCR (RSV, Flu  Tzirel Leonor&B, Covid) Anterior Nasal Swab     Status: None   Collection Time: 04/06/24  7:40 PM   Specimen: Anterior Nasal Swab  Result Value Ref Range Status   SARS Coronavirus 2 by RT PCR NEGATIVE NEGATIVE Final   Influenza Opha Mcghee by PCR NEGATIVE NEGATIVE Final   Influenza B by PCR NEGATIVE NEGATIVE Final    Comment: (NOTE) The Xpert Xpress SARS-CoV-2/FLU/RSV plus assay is intended as an aid in the diagnosis of influenza from Nasopharyngeal swab specimens and should not be used as Zona Pedro sole basis for treatment. Nasal washings and aspirates are unacceptable for Xpert Xpress SARS-CoV-2/FLU/RSV testing.  Fact Sheet for Patients: bloggercourse.com  Fact Sheet for Healthcare Providers: seriousbroker.it  This test is not yet approved or cleared by the [place]  FDA and has been authorized for detection and/or diagnosis of SARS-CoV-2 by FDA under an Emergency Use Authorization (EUA). This EUA will remain in effect (meaning this test can be used) for the duration of the COVID-19 declaration under Section 564(b)(1) of the Act, 21 U.S.C. section 360bbb-3(b)(1), unless the authorization is terminated or revoked.     Resp Syncytial Virus by PCR NEGATIVE NEGATIVE Final    Comment:  (NOTE) Fact Sheet for Patients: bloggercourse.com  Fact Sheet for Healthcare Providers: seriousbroker.it  This test is not yet approved or cleared by the [place]  FDA and has been authorized for detection and/or diagnosis of SARS-CoV-2 by FDA under an Emergency Use Authorization (EUA). This EUA will remain in effect (meaning this test can be used) for the duration of the COVID-19 declaration under Section 564(b)(1) of the Act, 21 U.S.C. section 360bbb-3(b)(1), unless the authorization is terminated or revoked.  Performed at [facility], [address], [place], [place] [address]   MRSA Next Gen by PCR, Nasal     Status: None   Collection Time: 04/09/24 10:12 PM   Specimen: Nasal Mucosa; Nasal Swab  Result Value Ref Range Status   MRSA by PCR Next Gen NOT DETECTED NOT DETECTED Final    Comment: (NOTE) The GeneXpert MRSA Assay (FDA approved for NASAL specimens only), is one component of Kortland Nichols comprehensive MRSA colonization surveillance program. It is not intended to diagnose MRSA infection nor to guide or monitor treatment for MRSA infections. Test performance is not FDA approved in patients less than 40 years old. Performed at [facility], [address] [place], [place] [address]      Labs: Basic Metabolic Panel: Recent Labs  Lab 04/07/24 0534 04/08/24 0817 04/09/24 0346 04/10/24 0701 04/11/24 0214  NA 135 135 135 134* 135  K 4.4 5.0 4.7 4.8 4.5  CL 92* 90* 91* 90* 91*  CO2 29 29 29 22 24   GLUCOSE 197* 165* 124* 124* 160*  BUN 53* 45* 45* 65* 74*  CREATININE 10.00* 8.66* 8.11* 11.20* 13.70*  CALCIUM  9.5 10.0 9.8 9.7 9.5  MG  --  2.8* 2.6* 2.8* 2.9*  PHOS 8.6* 8.7* 7.6* 8.6* 9.3*   Liver Function Tests: Recent Labs  Lab 04/07/24 0534 04/08/24 0817 04/09/24 0346 04/10/24 0701  AST  --  13* 14* 14*  ALT  --  6 <5 <5  ALKPHOS  --  126 117 115  BILITOT  --  0.4 0.3 0.3  PROT  --  8.3* 7.7 7.7   ALBUMIN 3.7 4.1 3.8 3.8   No results for input(s): LIPASE, AMYLASE in the last 168 hours. No results for input(s): AMMONIA in the last 168 hours. CBC: Recent Labs  Lab 04/07/24 0534 04/08/24 0817 04/09/24 0346 04/10/24 0701 04/11/24 0214  WBC 8.7 10.2 10.9* 10.9* 9.5  NEUTROABS  --  7.0 7.2 7.9*  --   HGB 10.1* 12.1* 11.5* 11.6* 11.3*  HCT 31.1* 37.6* 35.7* 35.6* 34.7*  MCV 94.8 96.4 94.9 94.4 94.6  PLT 384 455* 422* 445* 423*   Cardiac Enzymes: No results for input(s): CKTOTAL, CKMB, CKMBINDEX, TROPONINI in the last 168 hours. BNP: BNP (last 3 results) No results for input(s): BNP in the last 8760 hours.  ProBNP (last 3 results) No results for input(s): PROBNP in the last 8760 hours.  CBG: Recent Labs  Lab 04/10/24 0605 04/10/24 1111 04/10/24 1624 04/10/24 2105 04/11/24 0552  GLUCAP 130* 233* 95 146* 130*       Signed:  Meliton Monte MD.  [facility] 04/11/2024, 4:44 PM       [1]  Allergies Allergen Reactions   Hydrocodone  Itching   Oxycodone  Itching

## 2024-04-11 NOTE — Progress Notes (Signed)
 "  Rounding Note   Patient Name: [name] Date of Encounter: 04/11/2024  [facility] Cardiologist: Shelda Bruckner, MD   Subjective - No complaints - Tolerating dialysis well  Scheduled Meds:  apixaban   5 mg Oral BID   arformoterol   15 mcg Nebulization BID   budesonide   0.5 mg Nebulization BID   Chlorhexidine  Gluconate Cloth  6 each Topical Q0600   dextromethorphan -guaiFENesin   1 tablet Oral BID   ferric citrate   630 mg Oral TID WC   insulin  aspart  0-5 Units Subcutaneous QHS   insulin  aspart  0-6 Units Subcutaneous TID WC   revefenacin   175 mcg Nebulization Daily   sodium chloride  flush  3 mL Intravenous Q12H   Continuous Infusions:  PRN Meds: acetaminophen  **OR** acetaminophen , albuterol , benzonatate    Vital Signs  Vitals:   04/11/24 1055 04/11/24 1104 04/11/24 1134 04/11/24 1141  BP: (!) 148/90 (!) 161/100 (!) 157/98 (!) 152/99  Pulse: 72 72 73 73  Resp: 13 17 (!) 21 20  Temp: 97.6 F (36.4 C)     TempSrc:      SpO2: 94% 97% 95% 97%  Weight: 104.7 kg     Height:        Intake/Output Summary (Last 24 hours) at 04/11/2024 1317 Last data filed at 04/11/2024 0727 Gross per 24 hour  Intake 540 ml  Output --  Net 540 ml      04/11/2024   10:55 AM 04/11/2024    5:56 AM 04/10/2024    5:01 AM  Last 3 Weights  Weight (lbs) 230 lb 13.2 oz 228 lb 13.4 oz 228 lb 9.9 oz  Weight (kg) 104.7 kg 103.8 kg 103.7 kg      Telemetry NSR, occasional PVCs- Personally Reviewed  ECG  No new ECG  Physical Exam  GEN: No acute distress.   Neck: No JVD Cardiac: RRR, no murmurs, rubs, or gallops.  Respiratory: Clear to auscultation bilaterally. GI: Soft, nontender, non-distended  MS: No edema; No deformity. Neuro:  Nonfocal  Psych: Normal affect   Labs High Sensitivity Troponin:  No results for input(s): TROPONINIHS in the last 720 hours.  Recent Labs  Lab 04/06/24 1547 04/06/24 1828  TRNPT 208* 201*       Chemistry Recent Labs  Lab  04/08/24 0817 04/09/24 0346 04/10/24 0701 04/11/24 0214  NA 135 135 134* 135  K 5.0 4.7 4.8 4.5  CL 90* 91* 90* 91*  CO2 29 29 22 24   GLUCOSE 165* 124* 124* 160*  BUN 45* 45* 65* 74*  CREATININE 8.66* 8.11* 11.20* 13.70*  CALCIUM  10.0 9.8 9.7 9.5  MG 2.8* 2.6* 2.8* 2.9*  PROT 8.3* 7.7 7.7  --   ALBUMIN 4.1 3.8 3.8  --   AST 13* 14* 14*  --   ALT 6 <5 <5  --   ALKPHOS 126 117 115  --   BILITOT 0.4 0.3 0.3  --   GFRNONAA 7* 7* 5* 4*  ANIONGAP 16* 15 22* 20*    Lipids No results for input(s): CHOL, TRIG, HDL, LABVLDL, LDLCALC, CHOLHDL in the last 168 hours.  Hematology Recent Labs  Lab 04/09/24 0346 04/10/24 0701 04/11/24 0214  WBC 10.9* 10.9* 9.5  RBC 3.76* 3.77* 3.67*  HGB 11.5* 11.6* 11.3*  HCT 35.7* 35.6* 34.7*  MCV 94.9 94.4 94.6  MCH 30.6 30.8 30.8  MCHC 32.2 32.6 32.6  RDW 13.6 13.4 13.6  PLT 422* 445* 423*   Thyroid No results for input(s): TSH, FREET4  in the last 168 hours.  BNPNo results for input(s): BNP, PROBNP in the last 168 hours.  DDimer No results for input(s): DDIMER in the last 168 hours.   Radiology  No results found.    Patient Profile   59 year old on hemodialysis end-stage renal disease with acute on chronic diastolic heart failure recent hypotension during hemodialysis, remote paroxysmal atrial fibrillation with loop recorder   Assessment & Plan   #Hypotension during dialysis - Amlodipine  and hydralazine  stopped - Continuing with lower dose carvedilol  12.5 BID to give blood pressure room -- I recommend allowing the patient to take his hydralazine  as needed for SBP >160 on nondialysis days    #Acute on chronic diastolic heart failure - Echo with severe LVH grade 1 diastolic dysfunction normal RV, EF 60 to 65%.  -- The patient is lying flat in dialysis and does not appear to be volume overloaded.  --Can continue with volume management per HD.   #Non-Cardiac Chest pain - The patient states that his chest pain  occurs only when he coughs suggesting that is MSK related. --Perhaps the cough was exacerbated by some pulmonary edema; however, he says has been going on for several years. --No additional workup required at this time   Paroxysmal atrial fibrillation - No recurrence 6 months implantable loop - Continuing with anticoagulation secondary to right IJ DVT in November 2025.  Telemetry here clear.  Normal sinus rhythm      [facility] will sign off.   Medication Recommendations: Hold amlodipine , CHANGE hydralazine  to 50 mg 3 times daily as needed for systolic blood pressures >160 on non-dialysis days, CHANGE carvedilol  to 12.5 mg twice daily Other recommendations (labs, testing, etc): Outpatient blood pressure check Follow up as an outpatient: Cardiology will arrange cardiology follow-up For questions or updates, please contact  [facility] Please consult [email or web address] for contact info under       Signed, Georganna Archer, MD  04/11/2024, 1:17 PM    "

## 2024-04-11 NOTE — Plan of Care (Signed)
   Problem: Education: Goal: Ability to describe self-care measures that may prevent or decrease complications (Diabetes Survival Skills Education) will improve Outcome: Progressing

## 2024-04-13 NOTE — Progress Notes (Signed)
 Late Note Entry- Apr 13, 2024  Pt was d/c on Saturday. Contacted [facility] this morning to be advised of pt's d/c date and that pt should resume care today.   Randine Mungo Dialysis Navigator [phone]

## 2024-04-20 ENCOUNTER — Encounter (HOSPITAL_BASED_OUTPATIENT_CLINIC_OR_DEPARTMENT_OTHER): Payer: Self-pay | Admitting: Family

## 2024-04-20 ENCOUNTER — Ambulatory Visit (INDEPENDENT_AMBULATORY_CARE_PROVIDER_SITE_OTHER): Admitting: Family

## 2024-04-20 VITALS — BP 126/72 | HR 73 | Ht 67.0 in | Wt 236.6 lb

## 2024-04-20 DIAGNOSIS — I480 Paroxysmal atrial fibrillation: Secondary | ICD-10-CM

## 2024-04-20 DIAGNOSIS — N186 End stage renal disease: Secondary | ICD-10-CM

## 2024-04-20 DIAGNOSIS — D6869 Other thrombophilia: Secondary | ICD-10-CM

## 2024-04-20 DIAGNOSIS — I10 Essential (primary) hypertension: Secondary | ICD-10-CM

## 2024-04-20 MED ORDER — CARVEDILOL 12.5 MG PO TABS: 12.5000 mg | ORAL_TABLET | Freq: Two times a day (BID) | ORAL | 1 refills | Status: AC

## 2024-04-20 NOTE — Progress Notes (Unsigned)
 " Cardiology Office Note   Date:  04/20/2024  ID:  [name], DOB 06/08/1965, MRN [id number] PCP: Rik Glinda DASEN, FNP  [facility] Providers Cardiologist:  Shelda Bruckner, MD Electrophysiologist:  Will [name], MD { Click to update primary MD,subspecialty MD or APP then REFRESH:1}    History of Present Illness [name] is a 59 y.o. male ***HD M/W/F, HFpEF,   Goes to HD M/W/F, did go today. Reports some low blood pressure in HD but not routinely interrupting his treatment. BP at home 120-160/89-90s.   Reports no chest pain.   Some lightehadedness if he coughs or during dialysis.   Nonproductive cough PAF   ROS: Please see the history of present illness.    All other systems reviewed and are negative.   Studies Reviewed      Cardiac Studies & Procedures   ______________________________________________________________________________________________     ECHOCARDIOGRAM  ECHOCARDIOGRAM COMPLETE 04/08/2024  Narrative ECHOCARDIOGRAM REPORT    Patient Name:   [name] Date of Exam: 04/08/2024 Medical Rec #:  [id number]       Height:       67.0 in Accession #:    [id number]      Weight:       238.1 lb Date of Birth:  08-Aug-1965        BSA:          2.178 m Patient Age:    58 years        BP:           105/81 mmHg Patient Gender: M               HR:           72 bpm. Exam Location:  Inpatient  Procedure: 2D Echo, Cardiac Doppler and Color Doppler (Both Spectral and Color Flow Doppler were utilized during procedure).  Indications:    Congestive Heart Failure. Pulmonary hypertension.  History:        Patient has prior history of Echocardiogram examinations, most recent 06/11/2023. CHF and Cardiomegaly, Arrythmias:Atrial Fibrillation, Signs/Symptoms:Shortness of Breath; Risk Factors:Hypertension, Diabetes and Sleep Apnea.  Sonographer:    Juliene Rucks Referring Phys: [id number] BERNARD[address] KATHEE COME   Sonographer Comments: Patient is  obese. IMPRESSIONS   1. 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 2. Left ventricular ejection fraction, by estimation, is 60 to 65%. The left ventricle has normal function. The left ventricle has no regional wall motion abnormalities. There is severe concentric left ventricular hypertrophy. Left ventricular diastolic parameters are consistent with Grade I diastolic dysfunction (impaired relaxation). Elevated left atrial pressure. 3. Right ventricular systolic function is normal. The right ventricular size is normal. 4. The mitral valve is normal in structure. No evidence of mitral valve regurgitation. No evidence of mitral stenosis. Moderate mitral annular calcification. 5. The aortic valve is tricuspid. Aortic valve regurgitation is not visualized. Aortic valve sclerosis is present, with no evidence of aortic valve stenosis. 6. The inferior vena cava is normal in size with greater than 50% respiratory variability, suggesting right atrial pressure of 3 mmHg.  FINDINGS Left Ventricle: Left ventricular ejection fraction, by estimation, is 60 to 65%. The left ventricle has normal function. The left ventricle has no regional wall motion abnormalities. The left ventricular internal cavity size was normal in size. There is severe concentric left ventricular hypertrophy. Left ventricular diastolic parameters are consistent with Grade I diastolic dysfunction (impaired relaxation). Elevated left atrial pressure.  Right Ventricle: The right ventricular size is  normal. Right ventricular systolic function is normal.  Left Atrium: Left atrial size was normal in size.  Right Atrium: Right atrial size was normal in size.  Pericardium: There is no evidence of pericardial effusion.  Mitral Valve: The mitral valve is normal in structure. Moderate mitral annular calcification. No evidence of mitral valve regurgitation. No evidence of mitral valve stenosis.  Tricuspid Valve: The tricuspid valve  is normal in structure. Tricuspid valve regurgitation is trivial. No evidence of tricuspid stenosis.  Aortic Valve: The aortic valve is tricuspid. Aortic valve regurgitation is not visualized. Aortic valve sclerosis is present, with no evidence of aortic valve stenosis.  Pulmonic Valve: The pulmonic valve was normal in structure. Pulmonic valve regurgitation is not visualized. No evidence of pulmonic stenosis.  Aorta: The aortic root is normal in size and structure.  Venous: The inferior vena cava is normal in size with greater than 50% respiratory variability, suggesting right atrial pressure of 3 mmHg.  IAS/Shunts: The interatrial septum was not well visualized.  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   LEFT VENTRICLE PLAX 2D LVIDd:         4.90 cm   Diastology LVIDs:         3.30 cm   LV e' medial:    4.57 cm/s LV PW:         1.40 cm   LV E/e' medial:  16.4 LV IVS:        1.70 cm   LV e' lateral:   3.81 cm/s LVOT diam:     2.20 cm   LV E/e' lateral: 19.7 LV SV:         79 LV SV Index:   36 LVOT Area:     3.80 cm   RIGHT VENTRICLE RV S prime:     11.40 cm/s TAPSE (M-mode): 1.5 cm  LEFT ATRIUM             Index        RIGHT ATRIUM           Index LA diam:        4.40 cm 2.02 cm/m   RA Area:     10.90 cm LA Vol (A2C):   66.0 ml 30.31 ml/m  RA Volume:   18.60 ml  8.54 ml/m LA Vol (A4C):   59.1 ml 27.14 ml/m LA Biplane Vol: 65.4 ml 30.03 ml/m AORTIC VALVE LVOT Vmax:   117.00 cm/s LVOT Vmean:  73.300 cm/s LVOT VTI:    0.207 m  AORTA Ao Root diam: 3.20 cm Ao Asc diam:  3.00 cm  MITRAL VALVE MV Area (PHT): 2.59 cm     SHUNTS MV Decel Time: 293 msec     Systemic VTI:  0.21 m MV E velocity: 75.10 cm/s   Systemic Diam: 2.20 cm MV A velocity: 104.00 cm/s MV E/A ratio:  0.72  Redell Shallow MD Electronically signed by Redell Shallow MD Signature Date/Time: 04/08/2024/3:50:25 PM    Final           ______________________________________________________________________________________________      Risk Assessment/Calculations           Physical Exam VS:  BP 126/72 (BP Location: Right Arm, Patient Position: Sitting, Cuff Size: Large)   Pulse 73   Ht 5' 7 (1.702 m)   Wt 236 lb 9.6 oz (107.3 kg)   SpO2 96%   BMI 37.06 kg/m        Wt Readings from Last 3 Encounters:  04/20/24 236  lb 9.6 oz (107.3 kg)  04/11/24 228 lb 13.4 oz (103.8 kg)  02/18/24 244 lb 14.9 oz (111.1 kg)    GEN: Well nourished, well developed in no acute distress NECK: No JVD; No carotid bruits CARDIAC: ***RRR, no murmurs, rubs, gallops RESPIRATORY:  Clear to auscultation without rales, wheezing or rhonchi  ABDOMEN: Soft, non-tender, non-distended EXTREMITIES:  No edema; No deformity   ASSESSMENT AND PLAN ***    {Are you ordering a CV Procedure (e.g. stress test, cath, DCCV, TEE, etc)?   Press F2        :[id number]}  Dispo: ***  Signed, Reche GORMAN Finder, NP   "

## 2024-04-20 NOTE — Patient Instructions (Signed)
 Medication Instructions:   Do not take your Coreg  the am of dialysis.  *If you need a refill on your cardiac medications before your next appointment, please call your pharmacy*  Lab Work:  None ordered.  If you have labs (blood work) drawn today and your tests are completely normal, you will receive your results only by: MyChart Message (if you have MyChart) OR A paper copy in the mail If you have any lab test that is abnormal or we need to change your treatment, we will call you to review the results.  Testing/Procedures:  None ordered.  Follow-Up: At [facility], you and your health needs are our priority.  As part of our continuing mission to provide you with exceptional heart care, our providers are all part of one team.  This team includes your primary Cardiologist (physician) and Advanced Practice Providers or APPs (Physician Assistants and Nurse Practitioners) who all work together to provide you with the care you need, when you need it.  Your next appointment:   3 month(s)  Provider:   Shelda Bruckner, MD    We recommend signing up for the patient portal called MyChart.  Sign up information is provided on this After Visit Summary.  MyChart is used to connect with patients for Virtual Visits (Telemedicine).  Patients are able to view lab/test results, encounter notes, upcoming appointments, etc.  Non-urgent messages can be sent to your provider as well.   To learn more about what you can do with MyChart, go to [email or web address].   Other Instructions

## 2024-04-21 ENCOUNTER — Encounter (HOSPITAL_BASED_OUTPATIENT_CLINIC_OR_DEPARTMENT_OTHER): Payer: Self-pay | Admitting: Family

## 2024-04-21 ENCOUNTER — Encounter

## 2024-04-22 ENCOUNTER — Ambulatory Visit

## 2024-04-22 DIAGNOSIS — I480 Paroxysmal atrial fibrillation: Secondary | ICD-10-CM | POA: Diagnosis not present

## 2024-04-22 LAB — CUP PACEART REMOTE DEVICE CHECK
Date Time Interrogation Session: 20260113235317
Implantable Pulse Generator Implant Date: 20250711

## 2024-04-23 ENCOUNTER — Encounter

## 2024-04-24 ENCOUNTER — Ambulatory Visit: Payer: Self-pay | Admitting: Cardiology

## 2024-04-27 NOTE — Progress Notes (Signed)
 Remote Loop Recorder Transmission

## 2024-05-22 ENCOUNTER — Encounter

## 2024-05-23 ENCOUNTER — Encounter

## 2024-05-25 ENCOUNTER — Encounter

## 2024-06-22 ENCOUNTER — Encounter

## 2024-06-23 ENCOUNTER — Encounter

## 2024-06-25 ENCOUNTER — Encounter

## 2024-07-21 ENCOUNTER — Ambulatory Visit (HOSPITAL_BASED_OUTPATIENT_CLINIC_OR_DEPARTMENT_OTHER): Admitting: Cardiology

## 2024-07-24 ENCOUNTER — Encounter

## 2024-07-27 ENCOUNTER — Encounter

## 2024-08-24 ENCOUNTER — Encounter

## 2024-08-27 ENCOUNTER — Encounter

## 2024-09-28 ENCOUNTER — Encounter

## 2024-10-29 ENCOUNTER — Encounter
